# Patient Record
Sex: Female | Born: 1975 | Race: White | Hispanic: No | Smoking: Current some day smoker
Health system: Southern US, Community
[De-identification: ages and names within clinical notes are randomized; demographics above are authoritative.]

## PROBLEM LIST (undated history)

## (undated) DIAGNOSIS — M109 Gout, unspecified: Secondary | ICD-10-CM

## (undated) DIAGNOSIS — K859 Acute pancreatitis without necrosis or infection, unspecified: Secondary | ICD-10-CM

## (undated) DIAGNOSIS — F329 Major depressive disorder, single episode, unspecified: Secondary | ICD-10-CM

## (undated) DIAGNOSIS — J189 Pneumonia, unspecified organism: Secondary | ICD-10-CM

## (undated) DIAGNOSIS — G629 Polyneuropathy, unspecified: Secondary | ICD-10-CM

## (undated) DIAGNOSIS — F419 Anxiety disorder, unspecified: Secondary | ICD-10-CM

## (undated) DIAGNOSIS — J45909 Unspecified asthma, uncomplicated: Secondary | ICD-10-CM

## (undated) DIAGNOSIS — I8500 Esophageal varices without bleeding: Secondary | ICD-10-CM

## (undated) DIAGNOSIS — G7102 Facioscapulohumeral muscular dystrophy: Secondary | ICD-10-CM

## (undated) DIAGNOSIS — F603 Borderline personality disorder: Secondary | ICD-10-CM

## (undated) DIAGNOSIS — F1010 Alcohol abuse, uncomplicated: Secondary | ICD-10-CM

## (undated) DIAGNOSIS — E119 Type 2 diabetes mellitus without complications: Secondary | ICD-10-CM

## (undated) DIAGNOSIS — K219 Gastro-esophageal reflux disease without esophagitis: Secondary | ICD-10-CM

## (undated) DIAGNOSIS — M21379 Foot drop, unspecified foot: Secondary | ICD-10-CM

## (undated) DIAGNOSIS — F1021 Alcohol dependence, in remission: Secondary | ICD-10-CM

## (undated) DIAGNOSIS — F32A Depression, unspecified: Secondary | ICD-10-CM

## (undated) DIAGNOSIS — G7100 Muscular dystrophy, unspecified: Secondary | ICD-10-CM

## (undated) DIAGNOSIS — F319 Bipolar disorder, unspecified: Secondary | ICD-10-CM

## (undated) DIAGNOSIS — F1911 Other psychoactive substance abuse, in remission: Secondary | ICD-10-CM

## (undated) DIAGNOSIS — G4700 Insomnia, unspecified: Secondary | ICD-10-CM

## (undated) DIAGNOSIS — E785 Hyperlipidemia, unspecified: Secondary | ICD-10-CM

## (undated) HISTORY — DX: Muscular dystrophy, unspecified: G71.00

## (undated) HISTORY — DX: Insomnia, unspecified: G47.00

## (undated) HISTORY — DX: Hyperlipidemia, unspecified: E78.5

## (undated) HISTORY — DX: Other psychoactive substance abuse, in remission: F19.11

## (undated) HISTORY — DX: Gastro-esophageal reflux disease without esophagitis: K21.9

## (undated) HISTORY — DX: Polyneuropathy, unspecified: G62.9

## (undated) HISTORY — DX: Alcohol dependence, in remission: F10.21

## (undated) HISTORY — DX: Depression, unspecified: F32.A

## (undated) HISTORY — DX: Borderline personality disorder: F60.3

## (undated) HISTORY — DX: Foot drop, unspecified foot: M21.379

## (undated) HISTORY — PX: HERNIA REPAIR: SHX51

## (undated) HISTORY — DX: Type 2 diabetes mellitus without complications: E11.9

## (undated) HISTORY — DX: Major depressive disorder, single episode, unspecified: F32.9

---

## 2000-12-14 ENCOUNTER — Emergency Department (HOSPITAL_COMMUNITY): Admission: EM | Admit: 2000-12-14 | Discharge: 2000-12-14 | Payer: Self-pay | Admitting: Emergency Medicine

## 2001-01-13 ENCOUNTER — Emergency Department (HOSPITAL_COMMUNITY): Admission: EM | Admit: 2001-01-13 | Discharge: 2001-01-13 | Payer: Self-pay | Admitting: Emergency Medicine

## 2001-01-14 ENCOUNTER — Emergency Department (HOSPITAL_COMMUNITY): Admission: EM | Admit: 2001-01-14 | Discharge: 2001-01-14 | Payer: Self-pay | Admitting: *Deleted

## 2001-02-19 ENCOUNTER — Encounter: Payer: Self-pay | Admitting: Emergency Medicine

## 2001-02-19 ENCOUNTER — Emergency Department (HOSPITAL_COMMUNITY): Admission: EM | Admit: 2001-02-19 | Discharge: 2001-02-19 | Payer: Self-pay | Admitting: Emergency Medicine

## 2001-03-28 ENCOUNTER — Emergency Department (HOSPITAL_COMMUNITY): Admission: EM | Admit: 2001-03-28 | Discharge: 2001-03-28 | Payer: Self-pay | Admitting: Emergency Medicine

## 2001-04-29 ENCOUNTER — Emergency Department (HOSPITAL_COMMUNITY): Admission: EM | Admit: 2001-04-29 | Discharge: 2001-04-29 | Payer: Self-pay | Admitting: Emergency Medicine

## 2001-12-12 ENCOUNTER — Emergency Department (HOSPITAL_COMMUNITY): Admission: EM | Admit: 2001-12-12 | Discharge: 2001-12-12 | Payer: Self-pay | Admitting: Emergency Medicine

## 2001-12-12 ENCOUNTER — Encounter: Payer: Self-pay | Admitting: Emergency Medicine

## 2001-12-15 ENCOUNTER — Emergency Department (HOSPITAL_COMMUNITY): Admission: EM | Admit: 2001-12-15 | Discharge: 2001-12-15 | Payer: Self-pay | Admitting: Emergency Medicine

## 2001-12-25 ENCOUNTER — Emergency Department (HOSPITAL_COMMUNITY): Admission: EM | Admit: 2001-12-25 | Discharge: 2001-12-25 | Payer: Self-pay | Admitting: Emergency Medicine

## 2002-02-02 ENCOUNTER — Emergency Department (HOSPITAL_COMMUNITY): Admission: EM | Admit: 2002-02-02 | Discharge: 2002-02-02 | Payer: Self-pay | Admitting: Emergency Medicine

## 2002-02-02 ENCOUNTER — Encounter: Payer: Self-pay | Admitting: Emergency Medicine

## 2002-02-13 ENCOUNTER — Emergency Department (HOSPITAL_COMMUNITY): Admission: EM | Admit: 2002-02-13 | Discharge: 2002-02-13 | Payer: Self-pay | Admitting: Emergency Medicine

## 2002-03-18 ENCOUNTER — Encounter: Admission: RE | Admit: 2002-03-18 | Discharge: 2002-03-18 | Payer: Self-pay | Admitting: Internal Medicine

## 2002-03-18 ENCOUNTER — Encounter: Payer: Self-pay | Admitting: Internal Medicine

## 2002-05-21 ENCOUNTER — Emergency Department (HOSPITAL_COMMUNITY): Admission: EM | Admit: 2002-05-21 | Discharge: 2002-05-21 | Payer: Self-pay | Admitting: Emergency Medicine

## 2002-05-21 ENCOUNTER — Encounter: Payer: Self-pay | Admitting: Emergency Medicine

## 2002-05-31 ENCOUNTER — Emergency Department (HOSPITAL_COMMUNITY): Admission: EM | Admit: 2002-05-31 | Discharge: 2002-05-31 | Payer: Self-pay | Admitting: Emergency Medicine

## 2002-07-09 ENCOUNTER — Emergency Department (HOSPITAL_COMMUNITY): Admission: EM | Admit: 2002-07-09 | Discharge: 2002-07-09 | Payer: Self-pay | Admitting: Emergency Medicine

## 2003-02-09 ENCOUNTER — Emergency Department (HOSPITAL_COMMUNITY): Admission: EM | Admit: 2003-02-09 | Discharge: 2003-02-10 | Payer: Self-pay | Admitting: Emergency Medicine

## 2003-03-15 ENCOUNTER — Emergency Department (HOSPITAL_COMMUNITY): Admission: AD | Admit: 2003-03-15 | Discharge: 2003-03-15 | Payer: Self-pay | Admitting: Family Medicine

## 2003-04-12 ENCOUNTER — Emergency Department (HOSPITAL_COMMUNITY): Admission: AD | Admit: 2003-04-12 | Discharge: 2003-04-12 | Payer: Self-pay | Admitting: Family Medicine

## 2003-08-25 ENCOUNTER — Emergency Department (HOSPITAL_COMMUNITY): Admission: EM | Admit: 2003-08-25 | Discharge: 2003-08-25 | Payer: Self-pay | Admitting: Emergency Medicine

## 2003-08-30 ENCOUNTER — Emergency Department (HOSPITAL_COMMUNITY): Admission: EM | Admit: 2003-08-30 | Discharge: 2003-08-31 | Payer: Self-pay | Admitting: Emergency Medicine

## 2003-09-14 ENCOUNTER — Emergency Department (HOSPITAL_COMMUNITY): Admission: EM | Admit: 2003-09-14 | Discharge: 2003-09-15 | Payer: Self-pay | Admitting: *Deleted

## 2003-09-21 ENCOUNTER — Emergency Department (HOSPITAL_COMMUNITY): Admission: EM | Admit: 2003-09-21 | Discharge: 2003-09-21 | Payer: Self-pay | Admitting: Family Medicine

## 2003-10-01 ENCOUNTER — Emergency Department (HOSPITAL_COMMUNITY): Admission: EM | Admit: 2003-10-01 | Discharge: 2003-10-01 | Payer: Self-pay

## 2003-10-04 ENCOUNTER — Emergency Department (HOSPITAL_COMMUNITY): Admission: EM | Admit: 2003-10-04 | Discharge: 2003-10-04 | Payer: Self-pay | Admitting: Family Medicine

## 2003-10-06 ENCOUNTER — Inpatient Hospital Stay (HOSPITAL_COMMUNITY): Admission: RE | Admit: 2003-10-06 | Discharge: 2003-10-11 | Payer: Self-pay | Admitting: Psychiatry

## 2003-12-15 ENCOUNTER — Emergency Department (HOSPITAL_COMMUNITY): Admission: EM | Admit: 2003-12-15 | Discharge: 2003-12-15 | Payer: Self-pay | Admitting: Emergency Medicine

## 2004-01-05 ENCOUNTER — Emergency Department (HOSPITAL_COMMUNITY): Admission: EM | Admit: 2004-01-05 | Discharge: 2004-01-05 | Payer: Self-pay | Admitting: Family Medicine

## 2004-02-03 ENCOUNTER — Emergency Department (HOSPITAL_COMMUNITY): Admission: EM | Admit: 2004-02-03 | Discharge: 2004-02-03 | Payer: Self-pay | Admitting: Family Medicine

## 2004-02-26 ENCOUNTER — Emergency Department (HOSPITAL_COMMUNITY): Admission: EM | Admit: 2004-02-26 | Discharge: 2004-02-26 | Payer: Self-pay | Admitting: Family Medicine

## 2004-03-04 ENCOUNTER — Emergency Department (HOSPITAL_COMMUNITY): Admission: EM | Admit: 2004-03-04 | Discharge: 2004-03-04 | Payer: Self-pay | Admitting: Family Medicine

## 2004-03-22 ENCOUNTER — Emergency Department (HOSPITAL_COMMUNITY): Admission: EM | Admit: 2004-03-22 | Discharge: 2004-03-22 | Payer: Self-pay | Admitting: Emergency Medicine

## 2004-04-16 ENCOUNTER — Emergency Department (HOSPITAL_COMMUNITY): Admission: EM | Admit: 2004-04-16 | Discharge: 2004-04-16 | Payer: Self-pay | Admitting: Emergency Medicine

## 2004-05-15 ENCOUNTER — Emergency Department (HOSPITAL_COMMUNITY): Admission: EM | Admit: 2004-05-15 | Discharge: 2004-05-15 | Payer: Self-pay | Admitting: Emergency Medicine

## 2004-06-01 ENCOUNTER — Emergency Department (HOSPITAL_COMMUNITY): Admission: EM | Admit: 2004-06-01 | Discharge: 2004-06-01 | Payer: Self-pay | Admitting: Emergency Medicine

## 2004-06-18 ENCOUNTER — Inpatient Hospital Stay (HOSPITAL_COMMUNITY): Admission: AC | Admit: 2004-06-18 | Discharge: 2004-06-19 | Payer: Self-pay

## 2004-07-20 ENCOUNTER — Encounter: Payer: Self-pay | Admitting: Family Medicine

## 2004-07-20 ENCOUNTER — Ambulatory Visit: Payer: Self-pay | Admitting: Family Medicine

## 2004-07-23 ENCOUNTER — Emergency Department (HOSPITAL_COMMUNITY): Admission: EM | Admit: 2004-07-23 | Discharge: 2004-07-23 | Payer: Self-pay | Admitting: Emergency Medicine

## 2004-08-03 ENCOUNTER — Ambulatory Visit: Payer: Self-pay | Admitting: *Deleted

## 2004-08-29 ENCOUNTER — Emergency Department (HOSPITAL_COMMUNITY): Admission: EM | Admit: 2004-08-29 | Discharge: 2004-08-29 | Payer: Self-pay | Admitting: Emergency Medicine

## 2004-09-07 ENCOUNTER — Ambulatory Visit: Payer: Self-pay | Admitting: Obstetrics and Gynecology

## 2004-09-12 ENCOUNTER — Inpatient Hospital Stay (HOSPITAL_COMMUNITY): Admission: AD | Admit: 2004-09-12 | Discharge: 2004-09-12 | Payer: Self-pay | Admitting: *Deleted

## 2004-11-18 ENCOUNTER — Inpatient Hospital Stay (HOSPITAL_COMMUNITY): Admission: AD | Admit: 2004-11-18 | Discharge: 2004-11-24 | Payer: Self-pay | Admitting: Psychiatry

## 2004-11-18 ENCOUNTER — Ambulatory Visit: Payer: Self-pay | Admitting: Psychiatry

## 2004-11-30 ENCOUNTER — Emergency Department (HOSPITAL_COMMUNITY): Admission: EM | Admit: 2004-11-30 | Discharge: 2004-12-01 | Payer: Self-pay | Admitting: Emergency Medicine

## 2005-08-31 ENCOUNTER — Ambulatory Visit: Payer: Self-pay | Admitting: Family Medicine

## 2005-10-04 ENCOUNTER — Ambulatory Visit: Payer: Self-pay | Admitting: Obstetrics & Gynecology

## 2005-10-18 ENCOUNTER — Emergency Department (HOSPITAL_COMMUNITY): Admission: EM | Admit: 2005-10-18 | Discharge: 2005-10-19 | Payer: Self-pay | Admitting: Emergency Medicine

## 2006-01-09 ENCOUNTER — Ambulatory Visit: Payer: Self-pay | Admitting: Family Medicine

## 2006-12-02 ENCOUNTER — Encounter: Payer: Self-pay | Admitting: Internal Medicine

## 2006-12-18 ENCOUNTER — Emergency Department (HOSPITAL_COMMUNITY): Admission: EM | Admit: 2006-12-18 | Discharge: 2006-12-18 | Payer: Self-pay | Admitting: Family Medicine

## 2007-01-16 ENCOUNTER — Emergency Department (HOSPITAL_COMMUNITY): Admission: EM | Admit: 2007-01-16 | Discharge: 2007-01-16 | Payer: Self-pay | Admitting: Family Medicine

## 2007-02-19 ENCOUNTER — Emergency Department (HOSPITAL_COMMUNITY): Admission: EM | Admit: 2007-02-19 | Discharge: 2007-02-19 | Payer: Self-pay | Admitting: Family Medicine

## 2007-05-14 ENCOUNTER — Ambulatory Visit: Payer: Self-pay | Admitting: Obstetrics and Gynecology

## 2007-05-14 ENCOUNTER — Ambulatory Visit (HOSPITAL_COMMUNITY): Admission: AD | Admit: 2007-05-14 | Discharge: 2007-05-15 | Payer: Self-pay | Admitting: Obstetrics and Gynecology

## 2007-05-29 ENCOUNTER — Ambulatory Visit: Payer: Self-pay | Admitting: Obstetrics and Gynecology

## 2007-07-10 ENCOUNTER — Ambulatory Visit: Payer: Self-pay | Admitting: Family Medicine

## 2007-07-10 ENCOUNTER — Encounter: Payer: Self-pay | Admitting: Family Medicine

## 2007-12-29 ENCOUNTER — Emergency Department (HOSPITAL_COMMUNITY): Admission: EM | Admit: 2007-12-29 | Discharge: 2007-12-29 | Payer: Self-pay | Admitting: Emergency Medicine

## 2008-01-22 ENCOUNTER — Emergency Department (HOSPITAL_COMMUNITY): Admission: EM | Admit: 2008-01-22 | Discharge: 2008-01-22 | Payer: Self-pay | Admitting: Emergency Medicine

## 2008-04-26 ENCOUNTER — Emergency Department (HOSPITAL_COMMUNITY): Admission: EM | Admit: 2008-04-26 | Discharge: 2008-04-26 | Payer: Self-pay | Admitting: Emergency Medicine

## 2008-06-23 ENCOUNTER — Emergency Department (HOSPITAL_COMMUNITY): Admission: EM | Admit: 2008-06-23 | Discharge: 2008-06-23 | Payer: Self-pay | Admitting: Emergency Medicine

## 2008-06-23 ENCOUNTER — Inpatient Hospital Stay (HOSPITAL_COMMUNITY): Admission: AD | Admit: 2008-06-23 | Discharge: 2008-06-25 | Payer: Self-pay | Admitting: *Deleted

## 2008-06-23 ENCOUNTER — Ambulatory Visit: Payer: Self-pay | Admitting: *Deleted

## 2008-08-25 ENCOUNTER — Emergency Department (HOSPITAL_COMMUNITY): Admission: EM | Admit: 2008-08-25 | Discharge: 2008-08-25 | Payer: Self-pay | Admitting: Family Medicine

## 2008-09-02 ENCOUNTER — Emergency Department (HOSPITAL_COMMUNITY): Admission: EM | Admit: 2008-09-02 | Discharge: 2008-09-02 | Payer: Self-pay | Admitting: Emergency Medicine

## 2008-10-14 ENCOUNTER — Emergency Department (HOSPITAL_COMMUNITY): Admission: EM | Admit: 2008-10-14 | Discharge: 2008-10-14 | Payer: Self-pay | Admitting: Emergency Medicine

## 2008-11-21 ENCOUNTER — Emergency Department (HOSPITAL_COMMUNITY): Admission: EM | Admit: 2008-11-21 | Discharge: 2008-11-21 | Payer: Self-pay | Admitting: Emergency Medicine

## 2008-11-29 ENCOUNTER — Ambulatory Visit: Payer: Self-pay | Admitting: Psychiatry

## 2008-11-29 ENCOUNTER — Emergency Department (HOSPITAL_COMMUNITY): Admission: EM | Admit: 2008-11-29 | Discharge: 2008-11-29 | Payer: Self-pay | Admitting: Emergency Medicine

## 2008-11-29 ENCOUNTER — Inpatient Hospital Stay (HOSPITAL_COMMUNITY): Admission: AD | Admit: 2008-11-29 | Discharge: 2008-12-03 | Payer: Self-pay | Admitting: Psychiatry

## 2009-03-19 ENCOUNTER — Ambulatory Visit: Payer: Self-pay | Admitting: Internal Medicine

## 2009-05-07 ENCOUNTER — Emergency Department (HOSPITAL_COMMUNITY): Admission: EM | Admit: 2009-05-07 | Discharge: 2009-05-08 | Payer: Self-pay | Admitting: Emergency Medicine

## 2009-05-08 ENCOUNTER — Ambulatory Visit: Payer: Self-pay | Admitting: Psychiatry

## 2009-09-28 ENCOUNTER — Inpatient Hospital Stay (HOSPITAL_COMMUNITY): Admission: AD | Admit: 2009-09-28 | Discharge: 2009-10-02 | Payer: Self-pay | Admitting: Psychiatry

## 2009-09-28 ENCOUNTER — Ambulatory Visit: Payer: Self-pay | Admitting: Psychiatry

## 2009-09-28 ENCOUNTER — Emergency Department (HOSPITAL_COMMUNITY): Admission: EM | Admit: 2009-09-28 | Discharge: 2009-09-28 | Payer: Self-pay | Admitting: Emergency Medicine

## 2009-11-11 ENCOUNTER — Emergency Department (HOSPITAL_COMMUNITY): Admission: EM | Admit: 2009-11-11 | Discharge: 2009-11-11 | Payer: Self-pay | Admitting: Family Medicine

## 2010-03-06 ENCOUNTER — Emergency Department (HOSPITAL_COMMUNITY)
Admission: EM | Admit: 2010-03-06 | Discharge: 2010-03-06 | Payer: Self-pay | Source: Home / Self Care | Admitting: Emergency Medicine

## 2010-04-09 ENCOUNTER — Encounter: Payer: Self-pay | Admitting: *Deleted

## 2010-04-20 NOTE — Letter (Signed)
Summary: [facility].  [facility].   Imported By: Clyda Hurdle 12/06/2006 10:58:51  _____________________________________________________________________  External Attachment:    Type:   Image     Comment:   External Document

## 2010-05-23 ENCOUNTER — Emergency Department (HOSPITAL_COMMUNITY)
Admission: EM | Admit: 2010-05-23 | Discharge: 2010-05-23 | Discharge status: Against Medical Advice | Payer: Self-pay | Attending: Emergency Medicine | Admitting: Emergency Medicine

## 2010-05-23 DIAGNOSIS — Z046 Encounter for general psychiatric examination, requested by authority: Secondary | ICD-10-CM | POA: Insufficient documentation

## 2010-05-23 LAB — DIFFERENTIAL
Basophils Absolute: 0.1 10*3/uL (ref 0.0–0.1)
Basophils Relative: 1 % (ref 0–1)
Eosinophils Absolute: 0 10*3/uL (ref 0.0–0.7)
Eosinophils Relative: 1 % (ref 0–5)
Lymphocytes Relative: 30 % (ref 12–46)
Lymphs Abs: 2.2 10*3/uL (ref 0.7–4.0)
Monocytes Absolute: 0.5 10*3/uL (ref 0.1–1.0)
Monocytes Relative: 7 % (ref 3–12)
Neutro Abs: 4.3 10*3/uL (ref 1.7–7.7)
Neutrophils Relative %: 61 % (ref 43–77)

## 2010-05-23 LAB — URINALYSIS, ROUTINE W REFLEX MICROSCOPIC
Bilirubin Urine: NEGATIVE
Glucose, UA: NEGATIVE mg/dL
Hgb urine dipstick: NEGATIVE
Ketones, ur: NEGATIVE mg/dL
Nitrite: NEGATIVE
Protein, ur: NEGATIVE mg/dL
Specific Gravity, Urine: 1.008 (ref 1.005–1.030)
Urobilinogen, UA: 1 mg/dL (ref 0.0–1.0)
pH: 6.5 (ref 5.0–8.0)

## 2010-05-23 LAB — CBC
HCT: 43.4 % (ref 36.0–46.0)
Hemoglobin: 14.4 g/dL (ref 12.0–15.0)
MCH: 33.5 pg (ref 26.0–34.0)
MCHC: 33.2 g/dL (ref 30.0–36.0)
MCV: 100.9 fL — ABNORMAL HIGH (ref 78.0–100.0)
Platelets: 168 10*3/uL (ref 150–400)
RBC: 4.3 MIL/uL (ref 3.87–5.11)
RDW: 14.9 % (ref 11.5–15.5)
WBC: 7.1 10*3/uL (ref 4.0–10.5)

## 2010-05-23 LAB — COMPREHENSIVE METABOLIC PANEL
ALT: 39 U/L — ABNORMAL HIGH (ref 0–35)
AST: 268 U/L — ABNORMAL HIGH (ref 0–37)
Albumin: 3.7 g/dL (ref 3.5–5.2)
Alkaline Phosphatase: 77 U/L (ref 39–117)
BUN: 2 mg/dL — ABNORMAL LOW (ref 6–23)
CO2: 23 mEq/L (ref 19–32)
Calcium: 8.8 mg/dL (ref 8.4–10.5)
Chloride: 99 mEq/L (ref 96–112)
Creatinine, Ser: 0.77 mg/dL (ref 0.4–1.2)
GFR calc Af Amer: >60 mL/min (ref >60)
GFR calc non Af Amer: >60 mL/min (ref >60)
Glucose, Bld: 196 mg/dL — ABNORMAL HIGH (ref 70–99)
Potassium: 3.4 mEq/L — ABNORMAL LOW (ref 3.5–5.1)
Sodium: 136 mEq/L (ref 135–145)
Total Bilirubin: 0.9 mg/dL (ref 0.3–1.2)
Total Protein: 7.2 g/dL (ref 6.0–8.3)

## 2010-05-23 LAB — URINE MICROSCOPIC-ADD ON

## 2010-05-23 LAB — RAPID URINE DRUG SCREEN, HOSP PERFORMED
Amphetamines: NOT DETECTED
Barbiturates: POSITIVE — AB
Benzodiazepines: NOT DETECTED
Cocaine: POSITIVE — AB
Opiates: NOT DETECTED
Tetrahydrocannabinol: NOT DETECTED

## 2010-05-23 LAB — POCT PREGNANCY, URINE: Preg Test, Ur: NEGATIVE

## 2010-05-23 LAB — ETHANOL: Alcohol, Ethyl (B): 367 mg/dL — ABNORMAL HIGH (ref 0–10)

## 2010-05-23 LAB — LIPASE, BLOOD: Lipase: 31 U/L (ref 11–59)

## 2010-05-30 ENCOUNTER — Inpatient Hospital Stay (HOSPITAL_COMMUNITY)
Admission: AD | Admit: 2010-05-30 | Discharge: 2010-06-03 | DRG: 896 | Disposition: A | Discharge status: Home / Self Care | Payer: Self-pay | Source: Ambulatory Visit | Attending: Internal Medicine | Admitting: Internal Medicine

## 2010-05-30 DIAGNOSIS — E876 Hypokalemia: Secondary | ICD-10-CM | POA: Diagnosis not present

## 2010-05-30 DIAGNOSIS — F1020 Alcohol dependence, uncomplicated: Secondary | ICD-10-CM | POA: Diagnosis present

## 2010-05-30 DIAGNOSIS — F10239 Alcohol dependence with withdrawal, unspecified: Principal | ICD-10-CM | POA: Diagnosis present

## 2010-05-30 DIAGNOSIS — J45909 Unspecified asthma, uncomplicated: Secondary | ICD-10-CM | POA: Diagnosis present

## 2010-05-30 DIAGNOSIS — F10939 Alcohol use, unspecified with withdrawal, unspecified: Principal | ICD-10-CM | POA: Diagnosis present

## 2010-05-30 DIAGNOSIS — Z882 Allergy status to sulfonamides status: Secondary | ICD-10-CM

## 2010-05-30 DIAGNOSIS — K219 Gastro-esophageal reflux disease without esophagitis: Secondary | ICD-10-CM | POA: Diagnosis present

## 2010-05-30 DIAGNOSIS — K859 Acute pancreatitis without necrosis or infection, unspecified: Secondary | ICD-10-CM | POA: Diagnosis present

## 2010-05-30 DIAGNOSIS — N390 Urinary tract infection, site not specified: Secondary | ICD-10-CM | POA: Diagnosis present

## 2010-05-30 DIAGNOSIS — R030 Elevated blood-pressure reading, without diagnosis of hypertension: Secondary | ICD-10-CM | POA: Diagnosis present

## 2010-05-30 DIAGNOSIS — Z881 Allergy status to other antibiotic agents status: Secondary | ICD-10-CM

## 2010-05-30 DIAGNOSIS — F319 Bipolar disorder, unspecified: Secondary | ICD-10-CM | POA: Diagnosis present

## 2010-05-30 DIAGNOSIS — Z888 Allergy status to other drugs, medicaments and biological substances status: Secondary | ICD-10-CM

## 2010-05-30 DIAGNOSIS — E872 Acidosis, unspecified: Secondary | ICD-10-CM | POA: Diagnosis present

## 2010-05-30 DIAGNOSIS — F603 Borderline personality disorder: Secondary | ICD-10-CM | POA: Diagnosis present

## 2010-05-30 DIAGNOSIS — F17200 Nicotine dependence, unspecified, uncomplicated: Secondary | ICD-10-CM | POA: Diagnosis present

## 2010-05-30 DIAGNOSIS — E669 Obesity, unspecified: Secondary | ICD-10-CM | POA: Diagnosis present

## 2010-05-30 DIAGNOSIS — E119 Type 2 diabetes mellitus without complications: Secondary | ICD-10-CM | POA: Diagnosis present

## 2010-05-30 LAB — GLUCOSE, CAPILLARY: Glucose-Capillary: 172 mg/dL — ABNORMAL HIGH (ref 70–99)

## 2010-05-31 LAB — CBC
HCT: 37.6 % (ref 36.0–46.0)
Hemoglobin: 12.9 g/dL (ref 12.0–15.0)
MCH: 34 pg (ref 26.0–34.0)
MCHC: 34.3 g/dL (ref 30.0–36.0)
MCV: 99.2 fL (ref 78.0–100.0)
Platelets: 140 10*3/uL — ABNORMAL LOW (ref 150–400)
RBC: 3.79 MIL/uL — ABNORMAL LOW (ref 3.87–5.11)
RDW: 14.5 % (ref 11.5–15.5)
WBC: 5.5 10*3/uL (ref 4.0–10.5)

## 2010-05-31 LAB — GLUCOSE, CAPILLARY
Glucose-Capillary: 196 mg/dL — ABNORMAL HIGH (ref 70–99)
Glucose-Capillary: 154 mg/dL — ABNORMAL HIGH (ref 70–99)
Glucose-Capillary: 130 mg/dL — ABNORMAL HIGH (ref 70–99)
Glucose-Capillary: 137 mg/dL — ABNORMAL HIGH (ref 70–99)
Glucose-Capillary: 152 mg/dL — ABNORMAL HIGH (ref 70–99)

## 2010-05-31 LAB — COMPREHENSIVE METABOLIC PANEL
ALT: 32 U/L (ref 0–35)
AST: 251 U/L — ABNORMAL HIGH (ref 0–37)
Albumin: 3.4 g/dL — ABNORMAL LOW (ref 3.5–5.2)
Alkaline Phosphatase: 69 U/L (ref 39–117)
BUN: 7 mg/dL (ref 6–23)
CO2: 24 mEq/L (ref 19–32)
Calcium: 8.4 mg/dL (ref 8.4–10.5)
Chloride: 98 mEq/L (ref 96–112)
Creatinine, Ser: 0.87 mg/dL (ref 0.4–1.2)
GFR calc Af Amer: >60 mL/min (ref >60)
GFR calc non Af Amer: >60 mL/min (ref >60)
Glucose, Bld: 146 mg/dL — ABNORMAL HIGH (ref 70–99)
Potassium: 3.7 mEq/L (ref 3.5–5.1)
Sodium: 134 mEq/L — ABNORMAL LOW (ref 135–145)
Total Bilirubin: 4 mg/dL — ABNORMAL HIGH (ref 0.3–1.2)
Total Protein: 6.9 g/dL (ref 6.0–8.3)

## 2010-05-31 LAB — PROTIME-INR
INR: 1 (ref 0.00–1.49)
Prothrombin Time: 13.4 seconds (ref 11.6–15.2)

## 2010-05-31 LAB — APTT: aPTT: 26 seconds (ref 24–37)

## 2010-05-31 LAB — LIPASE, BLOOD: Lipase: 102 U/L — ABNORMAL HIGH (ref 11–59)

## 2010-06-01 DIAGNOSIS — F319 Bipolar disorder, unspecified: Secondary | ICD-10-CM

## 2010-06-01 LAB — COMPREHENSIVE METABOLIC PANEL
ALT: 26 U/L (ref 0–35)
AST: 206 U/L — ABNORMAL HIGH (ref 0–37)
Albumin: 3.1 g/dL — ABNORMAL LOW (ref 3.5–5.2)
Alkaline Phosphatase: 58 U/L (ref 39–117)
BUN: 2 mg/dL — ABNORMAL LOW (ref 6–23)
CO2: 23 mEq/L (ref 19–32)
Calcium: 8.3 mg/dL — ABNORMAL LOW (ref 8.4–10.5)
Chloride: 106 mEq/L (ref 96–112)
Creatinine, Ser: 0.73 mg/dL (ref 0.4–1.2)
GFR calc Af Amer: >60 mL/min (ref >60)
GFR calc non Af Amer: >60 mL/min (ref >60)
Glucose, Bld: 101 mg/dL — ABNORMAL HIGH (ref 70–99)
Potassium: 3.6 mEq/L (ref 3.5–5.1)
Sodium: 138 mEq/L (ref 135–145)
Total Bilirubin: 3.5 mg/dL — ABNORMAL HIGH (ref 0.3–1.2)
Total Protein: 6.1 g/dL (ref 6.0–8.3)

## 2010-06-01 LAB — URINE CULTURE
Colony Count: NO GROWTH
Culture  Setup Time: 201203141205
Culture: NO GROWTH
Special Requests: NEGATIVE

## 2010-06-01 LAB — GLUCOSE, CAPILLARY
Glucose-Capillary: 115 mg/dL — ABNORMAL HIGH (ref 70–99)
Glucose-Capillary: 140 mg/dL — ABNORMAL HIGH (ref 70–99)
Glucose-Capillary: 128 mg/dL — ABNORMAL HIGH (ref 70–99)

## 2010-06-01 LAB — URINALYSIS, ROUTINE W REFLEX MICROSCOPIC
Glucose, UA: NEGATIVE mg/dL
Hgb urine dipstick: NEGATIVE
Ketones, ur: NEGATIVE mg/dL
Nitrite: NEGATIVE
Protein, ur: NEGATIVE mg/dL
Specific Gravity, Urine: 1.011 (ref 1.005–1.030)
Urobilinogen, UA: 1 mg/dL (ref 0.0–1.0)
pH: 7 (ref 5.0–8.0)

## 2010-06-02 LAB — GLUCOSE, CAPILLARY
Glucose-Capillary: 156 mg/dL — ABNORMAL HIGH (ref 70–99)
Glucose-Capillary: 131 mg/dL — ABNORMAL HIGH (ref 70–99)
Glucose-Capillary: 174 mg/dL — ABNORMAL HIGH (ref 70–99)
Glucose-Capillary: 136 mg/dL — ABNORMAL HIGH (ref 70–99)
Glucose-Capillary: 189 mg/dL — ABNORMAL HIGH (ref 70–99)

## 2010-06-02 LAB — CBC
HCT: 34.2 % — ABNORMAL LOW (ref 36.0–46.0)
Hemoglobin: 11.3 g/dL — ABNORMAL LOW (ref 12.0–15.0)
MCH: 33 pg (ref 26.0–34.0)
MCHC: 33 g/dL (ref 30.0–36.0)
MCV: 100 fL (ref 78.0–100.0)
Platelets: 115 10*3/uL — ABNORMAL LOW (ref 150–400)
RBC: 3.42 MIL/uL — ABNORMAL LOW (ref 3.87–5.11)
RDW: 14.8 % (ref 11.5–15.5)
WBC: 5.4 10*3/uL (ref 4.0–10.5)

## 2010-06-02 LAB — COMPREHENSIVE METABOLIC PANEL
ALT: 21 U/L (ref 0–35)
AST: 138 U/L — ABNORMAL HIGH (ref 0–37)
Albumin: 3.1 g/dL — ABNORMAL LOW (ref 3.5–5.2)
Alkaline Phosphatase: 68 U/L (ref 39–117)
BUN: <1 mg/dL — ABNORMAL LOW (ref 6–23)
CO2: 24 mEq/L (ref 19–32)
Calcium: 8.7 mg/dL (ref 8.4–10.5)
Chloride: 104 mEq/L (ref 96–112)
Creatinine, Ser: 0.82 mg/dL (ref 0.4–1.2)
GFR calc Af Amer: >60 mL/min (ref >60)
GFR calc non Af Amer: >60 mL/min (ref >60)
Glucose, Bld: 106 mg/dL — ABNORMAL HIGH (ref 70–99)
Potassium: 3.2 mEq/L — ABNORMAL LOW (ref 3.5–5.1)
Sodium: 137 mEq/L (ref 135–145)
Total Bilirubin: 3.5 mg/dL — ABNORMAL HIGH (ref 0.3–1.2)
Total Protein: 6.2 g/dL (ref 6.0–8.3)

## 2010-06-02 LAB — MAGNESIUM: Magnesium: 1.2 mg/dL — ABNORMAL LOW (ref 1.5–2.5)

## 2010-06-02 LAB — LIPASE, BLOOD: Lipase: 43 U/L (ref 11–59)

## 2010-06-03 LAB — DRUGS OF ABUSE SCREEN W/O ALC, ROUTINE URINE
Amphetamine Screen, Ur: NEGATIVE
Barbiturate Quant, Ur: POSITIVE — AB
Benzodiazepines.: NEGATIVE
Cocaine Metabolites: NEGATIVE
Creatinine,U: 64.7 mg/dL
Marijuana Metabolite: NEGATIVE
Methadone: NEGATIVE
Opiate Screen, Urine: NEGATIVE
Phencyclidine (PCP): NEGATIVE
Propoxyphene: NEGATIVE

## 2010-06-03 LAB — BASIC METABOLIC PANEL
BUN: 1 mg/dL — ABNORMAL LOW (ref 6–23)
CO2: 24 mEq/L (ref 19–32)
Calcium: 8.9 mg/dL (ref 8.4–10.5)
Chloride: 103 mEq/L (ref 96–112)
Creatinine, Ser: 0.8 mg/dL (ref 0.4–1.2)
GFR calc Af Amer: >60 mL/min (ref >60)
GFR calc non Af Amer: >60 mL/min (ref >60)
Glucose, Bld: 144 mg/dL — ABNORMAL HIGH (ref 70–99)
Potassium: 3.8 mEq/L (ref 3.5–5.1)
Sodium: 135 mEq/L (ref 135–145)

## 2010-06-03 LAB — GLUCOSE, CAPILLARY: Glucose-Capillary: 164 mg/dL — ABNORMAL HIGH (ref 70–99)

## 2010-06-04 LAB — COMPREHENSIVE METABOLIC PANEL
ALT: 41 U/L — ABNORMAL HIGH (ref 0–35)
AST: 107 U/L — ABNORMAL HIGH (ref 0–37)
Albumin: 4.5 g/dL (ref 3.5–5.2)
Alkaline Phosphatase: 58 U/L (ref 39–117)
BUN: 6 mg/dL (ref 6–23)
CO2: 15 mEq/L — ABNORMAL LOW (ref 19–32)
Calcium: 9.1 mg/dL (ref 8.4–10.5)
Chloride: 101 mEq/L (ref 96–112)
Creatinine, Ser: 0.73 mg/dL (ref 0.4–1.2)
GFR calc Af Amer: >60 mL/min (ref >60)
GFR calc non Af Amer: >60 mL/min (ref >60)
Glucose, Bld: 160 mg/dL — ABNORMAL HIGH (ref 70–99)
Potassium: 3.5 mEq/L (ref 3.5–5.1)
Sodium: 132 mEq/L — ABNORMAL LOW (ref 135–145)
Total Bilirubin: 0.8 mg/dL (ref 0.3–1.2)
Total Protein: 7.8 g/dL (ref 6.0–8.3)

## 2010-06-04 LAB — DIFFERENTIAL
Basophils Absolute: 0.1 10*3/uL (ref 0.0–0.1)
Basophils Relative: 2 % — ABNORMAL HIGH (ref 0–1)
Eosinophils Absolute: 0.1 10*3/uL (ref 0.0–0.7)
Eosinophils Relative: 1 % (ref 0–5)
Lymphocytes Relative: 37 % (ref 12–46)
Lymphs Abs: 3 10*3/uL (ref 0.7–4.0)
Monocytes Absolute: 0.5 10*3/uL (ref 0.1–1.0)
Monocytes Relative: 7 % (ref 3–12)
Neutro Abs: 4.4 10*3/uL (ref 1.7–7.7)
Neutrophils Relative %: 54 % (ref 43–77)

## 2010-06-04 LAB — ETHANOL
Alcohol, Ethyl (B): 173 mg/dL — ABNORMAL HIGH (ref 0–10)
Alcohol, Ethyl (B): 271 mg/dL — ABNORMAL HIGH (ref 0–10)
Alcohol, Ethyl (B): 348 mg/dL — ABNORMAL HIGH (ref 0–10)

## 2010-06-04 LAB — URINALYSIS, ROUTINE W REFLEX MICROSCOPIC
Bilirubin Urine: NEGATIVE
Glucose, UA: NEGATIVE mg/dL
Ketones, ur: NEGATIVE mg/dL
Leukocytes, UA: NEGATIVE
Nitrite: NEGATIVE
Protein, ur: NEGATIVE mg/dL
Specific Gravity, Urine: 1.006 (ref 1.005–1.030)
Urobilinogen, UA: 0.2 mg/dL (ref 0.0–1.0)
pH: 6 (ref 5.0–8.0)

## 2010-06-04 LAB — URINE MICROSCOPIC-ADD ON

## 2010-06-04 LAB — CBC
HCT: 44.3 % (ref 36.0–46.0)
Hemoglobin: 15.4 g/dL — ABNORMAL HIGH (ref 12.0–15.0)
MCH: 35.4 pg — ABNORMAL HIGH (ref 26.0–34.0)
MCHC: 34.7 g/dL (ref 30.0–36.0)
MCV: 102 fL — ABNORMAL HIGH (ref 78.0–100.0)
Platelets: 187 10*3/uL (ref 150–400)
RBC: 4.34 MIL/uL (ref 3.87–5.11)
RDW: 14.9 % (ref 11.5–15.5)
WBC: 8.1 10*3/uL (ref 4.0–10.5)

## 2010-06-04 LAB — RAPID URINE DRUG SCREEN, HOSP PERFORMED
Amphetamines: NOT DETECTED
Barbiturates: NOT DETECTED
Benzodiazepines: NOT DETECTED
Cocaine: NOT DETECTED
Opiates: NOT DETECTED
Tetrahydrocannabinol: NOT DETECTED

## 2010-06-04 LAB — SALICYLATE LEVEL: Salicylate Lvl: <4 mg/dL (ref 2.8–20.0)

## 2010-06-04 LAB — POCT PREGNANCY, URINE: Preg Test, Ur: NEGATIVE

## 2010-06-04 LAB — ACETAMINOPHEN LEVEL: Acetaminophen (Tylenol), Serum: <10 ug/mL — ABNORMAL LOW (ref 10–30)

## 2010-06-07 LAB — CBC
HCT: 42.3 % (ref 36.0–46.0)
Hemoglobin: 14.3 g/dL (ref 12.0–15.0)
MCHC: 33.8 g/dL (ref 30.0–36.0)
MCV: 99.5 fL (ref 78.0–100.0)
Platelets: 250 10*3/uL (ref 150–400)
RBC: 4.26 MIL/uL (ref 3.87–5.11)
RDW: 16 % — ABNORMAL HIGH (ref 11.5–15.5)
WBC: 5.5 10*3/uL (ref 4.0–10.5)

## 2010-06-07 LAB — URINALYSIS, ROUTINE W REFLEX MICROSCOPIC
Bilirubin Urine: NEGATIVE
Glucose, UA: NEGATIVE mg/dL
Hgb urine dipstick: NEGATIVE
Ketones, ur: NEGATIVE mg/dL
Nitrite: NEGATIVE
Protein, ur: NEGATIVE mg/dL
Specific Gravity, Urine: 1.01 (ref 1.005–1.030)
Urobilinogen, UA: 0.2 mg/dL (ref 0.0–1.0)
pH: 6 (ref 5.0–8.0)

## 2010-06-07 LAB — DIFFERENTIAL
Basophils Absolute: 0.1 10*3/uL (ref 0.0–0.1)
Basophils Relative: 2 % — ABNORMAL HIGH (ref 0–1)
Eosinophils Absolute: 0 10*3/uL (ref 0.0–0.7)
Eosinophils Relative: 0 % (ref 0–5)
Lymphocytes Relative: 41 % (ref 12–46)
Lymphs Abs: 2.2 10*3/uL (ref 0.7–4.0)
Monocytes Absolute: 0.2 10*3/uL (ref 0.1–1.0)
Monocytes Relative: 5 % (ref 3–12)
Neutro Abs: 2.9 10*3/uL (ref 1.7–7.7)
Neutrophils Relative %: 53 % (ref 43–77)

## 2010-06-07 LAB — RAPID URINE DRUG SCREEN, HOSP PERFORMED
Amphetamines: NOT DETECTED
Barbiturates: NOT DETECTED
Benzodiazepines: NOT DETECTED
Cocaine: NOT DETECTED
Opiates: NOT DETECTED
Tetrahydrocannabinol: NOT DETECTED

## 2010-06-07 LAB — POCT I-STAT, CHEM 8
BUN: 6 mg/dL (ref 6–23)
Calcium, Ion: 1.01 mmol/L — ABNORMAL LOW (ref 1.12–1.32)
Chloride: 113 mEq/L — ABNORMAL HIGH (ref 96–112)
Creatinine, Ser: 0.6 mg/dL (ref 0.4–1.2)
Glucose, Bld: 85 mg/dL (ref 70–99)
HCT: 46 % (ref 36.0–46.0)
Hemoglobin: 15.6 g/dL — ABNORMAL HIGH (ref 12.0–15.0)
Potassium: 3.3 mEq/L — ABNORMAL LOW (ref 3.5–5.1)
Sodium: 144 mEq/L (ref 135–145)
TCO2: 18 mmol/L (ref 0–100)

## 2010-06-07 LAB — ETHANOL: Alcohol, Ethyl (B): 370 mg/dL — ABNORMAL HIGH (ref 0–10)

## 2010-06-07 LAB — POCT PREGNANCY, URINE: Preg Test, Ur: NEGATIVE

## 2010-06-08 LAB — BARBITURATE, URINE, CONFIRMATION
Amobarbital UR Quant: NEGATIVE
Butabarbital UR Quant: 340 ng/mL
Butalbital UR Quant: NEGATIVE
Pentobarbital GC/MS Conf: NEGATIVE
Phenobarbital GC/MS Conf: NEGATIVE
Secobarbital GC/MS Conf: NEGATIVE

## 2010-06-08 NOTE — Discharge Summary (Signed)
[name], WOOL NO.:  [id number]  MEDICAL RECORD NO.:  [id number]           PATIENT TYPE:  I  LOCATION:  5159                         FACILITY:  [facility]  PHYSICIAN:  Thora Lance, M.D.  DATE OF BIRTH:  [birth year]  DATE OF ADMISSION:  05/30/2010 DATE OF DISCHARGE:                              DISCHARGE SUMMARY   REASON FOR ADMISSION:  The patient is a 35 year old white female with a history of bipolar disorder and polysubstance abuse who presented with nausea and vomiting.  She admitted that she had been drinking heavily in the weeks prior to extending to our office.  She requested admission for detox.  SIGNIFICANT FINDINGS:  VITAL SIGNS:  Temperature 98.9, blood pressure 120/80, heart rate 68. LUNGS:  Some wheezing bilaterally. HEART:  Regular rate and rhythm. ABDOMEN:  Diffuse mild tenderness without rebound or guarding.  No masses.  No rebound. EXTREMITIES:  No edema. NEUROLOGIC:  Nonfocal. LABORATORY FINDINGS:  Glucose 152, BUN 6, creatinine 0.76, sodium 132, potassium 3.4, chloride 93, bicarbonate 20, calcium 9.7, total protein 7.7, albumin 4.3, total bilirubin 2.1, alk phos 78, AST 220, ALT 29. Urinalysis showed a trace leukocyte esterase, positive nitrite, 1+ protein, 2+ bilirubin.  Hemoglobin A1c was 6.9, amylase 50, lipase 112, micro urinalysis tended to be 20 wbcs.  HOSPITAL COURSE: 1. Alcohol withdrawal.  The patient was admitted for alcohol     withdrawal, placed in Alcohol withdrawal protocol.  Her nausea and     vomiting quickly improved with IV fluids and supportive care.  She     was placed on a clear liquid diet and her diet was advanced to a     regular diet by the second day which she tolerated well.  She had     mild elevation of lipase but no significant pain during the     hospitalization.  She likely had a very mild case of pancreatitis.     She was seen by Child psychotherapist and given full information on alcohol     treatment  options.  One day prior to discharge, her psychiatrist     placed her on clonazepam and her lorazepam was discontinued.  She     was given multivitamins, folic acid and thiamine. 2. Tobacco withdrawal.  The patient is a long-term smoker and was     placed on nicotine patch during the hospitalization to ease tobacco     withdrawal symptoms. 3. Bipolar disorder.  The patient had not been taking her psychiatric     medicines for at least several weeks prior to admission as she was     using alcohol heavily.  She was seen by the psychiatric service and     restarted on fluoxetine, clonazepam, Haldol and Tegretol.  She was     set up with an appointment with [facility] on March 19 at 3:00     p.m. for further psychiatric treatment. 4. Asthma.  The patient was treated with Symbicort throughout the     hospitalization.  She had no trouble with her asthma.  Her lungs  were clear at discharge. 5. Metabolic acidosis.  The patient did have a mild metabolic acidosis     at admission which was likely related to alcoholic ketoacidosis.    Her metabolic parameters normalized within 24 hours. 6. UTI.  The patient did have some pyuria at admission.  Urine culture     was obtained but was compromised, but being obtained after     antibiotics were administered.  Her urine was clear by discharge     per urinalysis and Cipro was stopped at discharge. 7. Diabetes mellitus, type 2, new diagnosis.  Hemoglobin A1c 6.9.     CBGs were in the low to mid 100s.  She was seen by a diabetic     teaching and given information of diabetic diet and basic     education. 8. Elevated blood pressure.  The patient's blood pressure was     initially elevated with the diastolic blood pressures in the 90s     and sometimes over 100.  By discharge her blood pressure was     dropping into the 120s/80s range.  This will be followed up as an     outpatient.  DISCHARGE DIAGNOSES: 1. Alcohol withdrawal. 2. Chronic  alcoholism. 3. Polysubstance abuse. 4. Tobacco use. 5. Bipolar disorder. 6. Asthma. 7. Mild pancreatitis. 8. Metabolic acidosis. 9. Urinary tract infection. 10.Diabetes mellitus. 11.Elevated blood pressure.  PROCEDURES:  None.  DISCHARGE MEDICATIONS: 1. Prilosec 20 mg 1 p.o. daily. 2. Symbicort 160/4.5 two puffs inhaled twice a day. 3. Ibuprofen 200 mg 2-4 tablets q.8 p.r.n. 4. Tegretol 200 mg 1 p.o. b.i.d. 5. Fluoxetine 40 mg 2 p.o. daily. 6. Haldol 5 mg two p.o. nightly. 7. Clonazepam 1 mg 1 p.o. b.i.d. 8. Multivitamin 1 p.o. daily. 9. Albuterol 90 mcg inhaler 2 puffs q.4 p.r.n. shortness of breath. 10.Thiamine 100 mg 1 p.o. daily for 1 month. 11.Folic acid 1 mg p.o. q.a.m. for 1 month. 12.Multivitamin 1 tablet p.o. q.a.m.  DISPOSITION:  Discharged to home.  FOLLOWUP:  March 19 with [facility] at 3:00 p.m. Two weeks Dr. Valentina Lucks.  CODE STATUS:  Full code.  DIET:  Regular diet.  ACTIVITY:  As tolerated.          ______________________________ Thora Lance, M.D.     JJG/MEDQ  D:  06/02/2010  T:  06/03/2010  Job:  [id number]  cc:   [facility]  Electronically Signed by Kirby Funk M.D. on 06/08/2010 06:26:10 PM

## 2010-06-10 NOTE — Consult Note (Signed)
[name], [name]                 ACCOUNT NO.:  [id number]  MEDICAL RECORD NO.:  [id number]           PATIENT TYPE:  I  LOCATION:  5114                         FACILITY:  [facility]  PHYSICIAN:  Eulogio Ditch, MD DATE OF BIRTH:  [birth year]  DATE OF CONSULTATION:  06/01/2010 DATE OF DISCHARGE:                                CONSULTATION   REASON FOR CONSULTATION:  Long history of mental issues and alcohol abuse.  HISTORY OF PRESENT ILLNESS:  A 35 year old white female who was seen with case manager, [place].  The patient has a long history of mental issues since the age of 65.  She has a history of bipolar disorder, borderline personality disorder and chronic alcohol abuse.  The patient is admitted on the medical floor, and he is getting alcohol detox and Ativan detox protocol.  The patient at this time has no withdrawal symptoms.  The patient is very logical and goal directed during the interview, was pleasant on approach.  Denies hearing any voices.  Denies any suicidal or homicidal ideations.  The patient is not internally preoccupied during the interview.  The patient told me that she is on Prozac 80 mg p.o. daily, Tegretol 4 mg twice a day, Haldol 10 mg at bedtime, trazodone 100 mg at bedtime but the patient is noncompliant with his medications as she could not afford to follow up with Dr. Betti Cruz in the outpatient practice as she cannot afford to pay 95 dollar per visit but she got an appointment now at [facility] to follow up in the outpatient setting, phone number [phone], extension [phone], person name is Crystal.  The patient told me that in the past she has got improved by getting DVD for borderline personality.  The patient was also admitted to [facility] in the past for alcohol abuse and self- inflicting  injuries for cutting herself on her forearm.  At this time, the patient has no new injuries on the forearm and she is not suicidal.  MEDICAL ISSUES:  She is  obese, history of alcohol abuse.  History of MRSA.  ALLERGIES:  ERYTHROMYCIN, SULFA, NAPROSYN.  MENTAL STATUS EXAMINATION:  The patient is calm, cooperative during interview, pleasant on approach.  Hygiene, grooming, fair.  No psychomotor agitation or retardation noted during the interview.  Speech normal in rate, rhythm and volume.  Mood, anxious affect, mood congruent.  Thought process logical and goal directed.  Thought content not suicidal or homicidal.  No delusional thought perception.  No audiovisual hallucination reported.  Not internally preoccupied. Cognition alert, awake, oriented x3.  Memory immediate, recent remote. Fund of knowledge fair.  Attention concentration good.  Insight and judgment intact.  DIAGNOSIS:  AXIS I:  As per history bipolar disorder, chronic alcohol abuse, cocaine abuse. AXIS II:  Deferred. AXIS III:  See medical notes. AXIS IV:  Chronic mental health issues. AXIS V:  50.  RECOMMENDATIONS: 1. The patient can be continued on Ativan detox protocol, thiamine,     folic acid. 2. I started the patient on Prozac 50 mg p.o. daily, Tegretol 200 mg     twice a day.  I started with 200 mg as the patient was noncompliant     with this medication.  Haldol 10 mg at bedtime.  The patient was     not getting any benefit from trazodone, so I did not start     trazodone at this time. 3. The patient can follow up in the outpatient setting at [facility]. 4. The patient was given option for behavioral health, but the patient     do not want to be admitted to Behavior Health.  The patient does     not meet criteria to be admitted to [facility] on IVC.     Eulogio Ditch, MD     SA/MEDQ  D:  06/02/2010  T:  06/02/2010  Job:  [id number]  Electronically Signed by Eulogio Ditch  on 06/10/2010 07:17:15 AM

## 2010-06-15 NOTE — H&P (Signed)
[name], [name]                 ACCOUNT NO.:  [id number]  MEDICAL RECORD NO.:  [id number]           PATIENT TYPE:  I  LOCATION:  5114                         FACILITY:  [facility]  PHYSICIAN:  Thora Lance, M.D.  DATE OF BIRTH:  06-17-1975  DATE OF ADMISSION:  05/30/2010 DATE OF DISCHARGE:                             HISTORY & PHYSICAL   HISTORY OF PRESENT ILLNESS: 1. Acidosis, probably alcoholic.  The patient was found to be in a     state of acidosis yesterday.  Brought back in today for further     evaluation.  Glucose yesterday of 200.  Potassium of 2.9.  Was     asked to replace potassium with supplement.  Unable to tolerate due     to nausea and vomiting.  Continues to drink alcohol.  Complains of     nausea, vomiting with no diarrhea.  Generalized abdominal pain 2. Abdominal pain generalized.  The patient complains of generalized     abdominal pain.  Drinks "a lot" of alcohol daily.  Lab work reveals     a lipase of 112 suggesting pancreatitis.  AST is 220.  Total     bilirubin 2.1.  Urine shows 10-20 white cells per high power field     suggesting a UTI.  Glucose today 153.  Potassium 3.4.  We will     admit because of her pancreatitis.  Also treat the UTI while she is     in the hospital.  She is agreeable to alcohol detox. 3. Alcohol abuse.  The patient admits to having a drinking problem.     Desires assistance with alcohol withdrawal.  The patient will be     admitted for pancreatitis and goes through alcohol detox during     this hospitalization.  CURRENT MEDICATIONS: 1. Flonase 15 mcg 2 puffs once a day. 2. Nystatin 100,000 units per mL suspension, 4 mL 4 times a day swish     and swallow. 3. Klor-Con 20 mEq 3 tablets a day. 4. Prozac 40 mg 2 tablets once a day. 5. Tegretol 200 mg 2 b.i.d. 6. Clonazepam 1 mg 1 t.i.d. p.r.n. 7. Haldol 10 mg 1 at bedtime. 8. Trazodone 100 mg 1 tablet at bedtime. 9. Prilosec OTC 20 mg 1 tablet a day. 10.Symbicort 160/4.5 mcg 2  puffs twice a day. 11.Albuterol sulfate HFA 180 mcg. 12.Aerosol solution 2 puffs 4 times a day as needed.  MEDICAL HISTORY: 1. Asthma, persistent 2. GERD. 3. Bipolar disorder. 4. Borderline personality.  DRUG ALLERGIES: 1. ERYTHROMYCIN, difficulty breathing. 2. SULFA, difficulty breathing. 3. NAPROSYN, stomach upset.  SURGICAL HISTORY:  Hernia repair in 1996.  FAMILY HISTORY:  Father alive, history of alcoholism and hypertension. Mother alive, history of COPD and muscular dystrophy.  One brother alive with history of gout.  SOCIAL HISTORY:  She is a smoker approximately a pack a year 30. Alcohol intake daily (a lot).  Recreational drug use including cocaine, heroin, crack.  Denies drug use in the last 5 years.  Unemployed. Formally worked in [employer] and has been employed by [employer] in  the past.  She is separated.  PHYSICAL EXAMINATION:  VITAL SIGNS:  Weight 184.2, height 97.5.  BMI 28.42.  Temperature 98.9.  Pulse 68.  Blood pressure 120/80. GENERAL APPEARANCE:  Alert, chronically ill appearing.  Disheveled. Smells of alcohol. HEENT:  Grossly normal.  Canals clear.  Bilaterally injected conjunctivae.  Pupils equal and reactive to light.  Dentition normal. Tongue coated. NECK:  Supple with full range of motion.  Without evidence of thyromegaly, adenopathy, or bruit. CHEST:  Wheezing throughout, did not clear with cough. HEART:  Normal S1, S2 without murmurs, rubs, gallops, or click. ABDOMEN:  Diffusely tender without masses, organomegaly, rebound, or peritonitis. RECTAL:  Not done. MUSCULOSKELETAL:  Moves all extremities x4.  ASSESSMENT: 1. Acidosis, probably alcoholic.  Associated with elevated glucose,     low potassium.  Anion gap of 19.  CMET, urinalysis, hemoglobin     drawn today.  See attached. 2. Abdominal pain, generalized, alcohol use daily.  Pancreatitis was     verified with a lipase of 112.  Also has urinary tract infection. 3.  Alcohol abuse.  Request alcohol detox.  This will be initiated in     the hospital.  Consult with Dr. Kirby Funk.     Lavinia Sharps, N.P.   ______________________________ Thora Lance, M.D.    MAP/MEDQ  D:  05/30/2010  T:  05/31/2010  Job:  [id number]  Electronically Signed by Mee Hives N.P. on 06/13/2010 09:13:23 AM Electronically Signed by Kirby Funk M.D. on 06/15/2010 06:50:27 PM

## 2010-06-23 LAB — RAPID URINE DRUG SCREEN, HOSP PERFORMED
Amphetamines: NOT DETECTED
Amphetamines: NOT DETECTED
Barbiturates: NOT DETECTED
Barbiturates: NOT DETECTED
Benzodiazepines: NOT DETECTED
Benzodiazepines: NOT DETECTED
Cocaine: NOT DETECTED
Cocaine: NOT DETECTED
Opiates: NOT DETECTED
Opiates: NOT DETECTED
Tetrahydrocannabinol: NOT DETECTED
Tetrahydrocannabinol: NOT DETECTED

## 2010-06-23 LAB — CBC
HCT: 37 % (ref 36.0–46.0)
HCT: 37.3 % (ref 36.0–46.0)
Hemoglobin: 12.6 g/dL (ref 12.0–15.0)
Hemoglobin: 12.7 g/dL (ref 12.0–15.0)
MCHC: 34 g/dL (ref 30.0–36.0)
MCHC: 34.2 g/dL (ref 30.0–36.0)
MCV: 100.7 fL — ABNORMAL HIGH (ref 78.0–100.0)
MCV: 100.8 fL — ABNORMAL HIGH (ref 78.0–100.0)
Platelets: 208 10*3/uL (ref 150–400)
Platelets: 216 10*3/uL (ref 150–400)
RBC: 3.67 MIL/uL — ABNORMAL LOW (ref 3.87–5.11)
RBC: 3.7 MIL/uL — ABNORMAL LOW (ref 3.87–5.11)
RDW: 14.7 % (ref 11.5–15.5)
RDW: 15 % (ref 11.5–15.5)
WBC: 6.2 10*3/uL (ref 4.0–10.5)
WBC: 7.7 10*3/uL (ref 4.0–10.5)

## 2010-06-23 LAB — POCT PREGNANCY, URINE: Preg Test, Ur: NEGATIVE

## 2010-06-23 LAB — URINALYSIS, ROUTINE W REFLEX MICROSCOPIC
Bilirubin Urine: NEGATIVE
Bilirubin Urine: NEGATIVE
Glucose, UA: NEGATIVE mg/dL
Glucose, UA: NEGATIVE mg/dL
Ketones, ur: NEGATIVE mg/dL
Ketones, ur: NEGATIVE mg/dL
Leukocytes, UA: NEGATIVE
Leukocytes, UA: NEGATIVE
Nitrite: NEGATIVE
Nitrite: NEGATIVE
Protein, ur: NEGATIVE mg/dL
Protein, ur: NEGATIVE mg/dL
Specific Gravity, Urine: 1.005 (ref 1.005–1.030)
Specific Gravity, Urine: 1.007 (ref 1.005–1.030)
Urobilinogen, UA: 0.2 mg/dL (ref 0.0–1.0)
Urobilinogen, UA: 0.2 mg/dL (ref 0.0–1.0)
pH: 6.5 (ref 5.0–8.0)
pH: 6.5 (ref 5.0–8.0)

## 2010-06-23 LAB — DIFFERENTIAL
Basophils Absolute: 0.1 10*3/uL (ref 0.0–0.1)
Basophils Absolute: 0.1 10*3/uL (ref 0.0–0.1)
Basophils Relative: 1 % (ref 0–1)
Basophils Relative: 2 % — ABNORMAL HIGH (ref 0–1)
Eosinophils Absolute: 0 10*3/uL (ref 0.0–0.7)
Eosinophils Absolute: 0.1 10*3/uL (ref 0.0–0.7)
Eosinophils Relative: 1 % (ref 0–5)
Eosinophils Relative: 1 % (ref 0–5)
Lymphocytes Relative: 30 % (ref 12–46)
Lymphocytes Relative: 48 % — ABNORMAL HIGH (ref 12–46)
Lymphs Abs: 2.3 10*3/uL (ref 0.7–4.0)
Lymphs Abs: 2.9 10*3/uL (ref 0.7–4.0)
Monocytes Absolute: 0.3 10*3/uL (ref 0.1–1.0)
Monocytes Absolute: 0.4 10*3/uL (ref 0.1–1.0)
Monocytes Relative: 4 % (ref 3–12)
Monocytes Relative: 6 % (ref 3–12)
Neutro Abs: 2.8 10*3/uL (ref 1.7–7.7)
Neutro Abs: 4.8 10*3/uL (ref 1.7–7.7)
Neutrophils Relative %: 45 % (ref 43–77)
Neutrophils Relative %: 63 % (ref 43–77)

## 2010-06-23 LAB — POCT I-STAT, CHEM 8
BUN: 6 mg/dL (ref 6–23)
Calcium, Ion: 1.06 mmol/L — ABNORMAL LOW (ref 1.12–1.32)
Chloride: 108 mEq/L (ref 96–112)
Creatinine, Ser: 0.6 mg/dL (ref 0.4–1.2)
Glucose, Bld: 126 mg/dL — ABNORMAL HIGH (ref 70–99)
HCT: 40 % (ref 36.0–46.0)
Hemoglobin: 13.6 g/dL (ref 12.0–15.0)
Potassium: 3.3 mEq/L — ABNORMAL LOW (ref 3.5–5.1)
Sodium: 137 mEq/L (ref 135–145)
TCO2: 16 mmol/L (ref 0–100)

## 2010-06-23 LAB — BASIC METABOLIC PANEL
BUN: 11 mg/dL (ref 6–23)
CO2: 23 mEq/L (ref 19–32)
Calcium: 9.6 mg/dL (ref 8.4–10.5)
Chloride: 104 mEq/L (ref 96–112)
Creatinine, Ser: 0.74 mg/dL (ref 0.4–1.2)
GFR calc Af Amer: >60 mL/min (ref >60)
GFR calc non Af Amer: >60 mL/min (ref >60)
Glucose, Bld: 102 mg/dL — ABNORMAL HIGH (ref 70–99)
Potassium: 3.7 mEq/L (ref 3.5–5.1)
Sodium: 142 mEq/L (ref 135–145)

## 2010-06-23 LAB — URINE MICROSCOPIC-ADD ON

## 2010-06-23 LAB — CARBAMAZEPINE LEVEL, TOTAL: Carbamazepine Lvl: 8.2 ug/mL (ref 4.0–12.0)

## 2010-06-23 LAB — ETHANOL
Alcohol, Ethyl (B): 213 mg/dL — ABNORMAL HIGH (ref 0–10)
Alcohol, Ethyl (B): 296 mg/dL — ABNORMAL HIGH (ref 0–10)

## 2010-06-25 LAB — RAPID URINE DRUG SCREEN, HOSP PERFORMED
Amphetamines: NOT DETECTED
Barbiturates: NOT DETECTED
Benzodiazepines: NOT DETECTED
Cocaine: NOT DETECTED
Opiates: NOT DETECTED
Tetrahydrocannabinol: NOT DETECTED

## 2010-06-25 LAB — DIFFERENTIAL
Basophils Absolute: 0.1 10*3/uL (ref 0.0–0.1)
Basophils Relative: 2 % — ABNORMAL HIGH (ref 0–1)
Eosinophils Absolute: 0.1 10*3/uL (ref 0.0–0.7)
Eosinophils Relative: 1 % (ref 0–5)
Lymphocytes Relative: 59 % — ABNORMAL HIGH (ref 12–46)
Lymphs Abs: 3.1 10*3/uL (ref 0.7–4.0)
Monocytes Absolute: 0.2 10*3/uL (ref 0.1–1.0)
Monocytes Relative: 4 % (ref 3–12)
Neutro Abs: 1.8 10*3/uL (ref 1.7–7.7)
Neutrophils Relative %: 34 % — ABNORMAL LOW (ref 43–77)

## 2010-06-25 LAB — BASIC METABOLIC PANEL
BUN: 11 mg/dL (ref 6–23)
CO2: 20 mEq/L (ref 19–32)
Calcium: 9.2 mg/dL (ref 8.4–10.5)
Chloride: 106 mEq/L (ref 96–112)
Creatinine, Ser: 0.63 mg/dL (ref 0.4–1.2)
GFR calc Af Amer: >60 mL/min (ref >60)
GFR calc non Af Amer: >60 mL/min (ref >60)
Glucose, Bld: 107 mg/dL — ABNORMAL HIGH (ref 70–99)
Potassium: 3.8 mEq/L (ref 3.5–5.1)
Sodium: 142 mEq/L (ref 135–145)

## 2010-06-25 LAB — CBC
HCT: 36.6 % (ref 36.0–46.0)
Hemoglobin: 12.7 g/dL (ref 12.0–15.0)
MCHC: 34.8 g/dL (ref 30.0–36.0)
MCV: 97.2 fL (ref 78.0–100.0)
Platelets: 208 10*3/uL (ref 150–400)
RBC: 3.76 MIL/uL — ABNORMAL LOW (ref 3.87–5.11)
RDW: 15.6 % — ABNORMAL HIGH (ref 11.5–15.5)
WBC: 5.2 10*3/uL (ref 4.0–10.5)

## 2010-06-25 LAB — ETHANOL: Alcohol, Ethyl (B): 375 mg/dL — ABNORMAL HIGH (ref 0–10)

## 2010-06-25 LAB — CARBAMAZEPINE LEVEL, TOTAL: Carbamazepine Lvl: <2 ug/mL — ABNORMAL LOW (ref 4.0–12.0)

## 2010-06-25 LAB — URINALYSIS, ROUTINE W REFLEX MICROSCOPIC
Bilirubin Urine: NEGATIVE
Glucose, UA: NEGATIVE mg/dL
Leukocytes, UA: NEGATIVE
Nitrite: NEGATIVE
Protein, ur: NEGATIVE mg/dL
Specific Gravity, Urine: 1.012 (ref 1.005–1.030)
Urobilinogen, UA: 0.2 mg/dL (ref 0.0–1.0)
pH: 6 (ref 5.0–8.0)

## 2010-06-25 LAB — PREGNANCY, URINE: Preg Test, Ur: NEGATIVE

## 2010-06-25 LAB — URINE MICROSCOPIC-ADD ON

## 2010-06-26 LAB — DIFFERENTIAL
Basophils Absolute: 0.3 10*3/uL — ABNORMAL HIGH (ref 0.0–0.1)
Basophils Relative: 4 % — ABNORMAL HIGH (ref 0–1)
Eosinophils Absolute: 0.1 10*3/uL (ref 0.0–0.7)
Eosinophils Relative: 1 % (ref 0–5)
Lymphocytes Relative: 42 % (ref 12–46)
Lymphs Abs: 2.6 10*3/uL (ref 0.7–4.0)
Monocytes Absolute: 0.4 10*3/uL (ref 0.1–1.0)
Monocytes Relative: 7 % (ref 3–12)
Neutro Abs: 2.7 10*3/uL (ref 1.7–7.7)
Neutrophils Relative %: 45 % (ref 43–77)

## 2010-06-26 LAB — BASIC METABOLIC PANEL
BUN: 11 mg/dL (ref 6–23)
CO2: 21 mEq/L (ref 19–32)
Calcium: 8.8 mg/dL (ref 8.4–10.5)
Chloride: 105 mEq/L (ref 96–112)
Creatinine, Ser: 0.53 mg/dL (ref 0.4–1.2)
GFR calc Af Amer: >60 mL/min (ref >60)
GFR calc non Af Amer: >60 mL/min (ref >60)
Glucose, Bld: 104 mg/dL — ABNORMAL HIGH (ref 70–99)
Potassium: 4.1 mEq/L (ref 3.5–5.1)
Sodium: 137 mEq/L (ref 135–145)

## 2010-06-26 LAB — RAPID URINE DRUG SCREEN, HOSP PERFORMED
Amphetamines: NOT DETECTED
Barbiturates: NOT DETECTED
Benzodiazepines: NOT DETECTED
Cocaine: NOT DETECTED
Opiates: NOT DETECTED
Tetrahydrocannabinol: NOT DETECTED

## 2010-06-26 LAB — CBC
HCT: 36.4 % (ref 36.0–46.0)
Hemoglobin: 12.7 g/dL (ref 12.0–15.0)
MCHC: 34.8 g/dL (ref 30.0–36.0)
MCV: 95.9 fL (ref 78.0–100.0)
Platelets: 245 10*3/uL (ref 150–400)
RBC: 3.79 MIL/uL — ABNORMAL LOW (ref 3.87–5.11)
RDW: 13.9 % (ref 11.5–15.5)
WBC: 6.1 10*3/uL (ref 4.0–10.5)

## 2010-06-26 LAB — ETHANOL: Alcohol, Ethyl (B): 208 mg/dL — ABNORMAL HIGH (ref 0–10)

## 2010-06-26 LAB — POCT RAPID STREP A (OFFICE): Streptococcus, Group A Screen (Direct): NEGATIVE

## 2010-06-28 LAB — BASIC METABOLIC PANEL
BUN: 10 mg/dL (ref 6–23)
CO2: 20 mEq/L (ref 19–32)
Calcium: 9.1 mg/dL (ref 8.4–10.5)
Chloride: 106 mEq/L (ref 96–112)
Creatinine, Ser: 0.68 mg/dL (ref 0.4–1.2)
GFR calc Af Amer: >60 mL/min (ref >60)
GFR calc non Af Amer: >60 mL/min (ref >60)
Glucose, Bld: 114 mg/dL — ABNORMAL HIGH (ref 70–99)
Potassium: 4 mEq/L (ref 3.5–5.1)
Sodium: 137 mEq/L (ref 135–145)

## 2010-06-28 LAB — DIFFERENTIAL
Basophils Absolute: 0.1 10*3/uL (ref 0.0–0.1)
Basophils Relative: 2 % — ABNORMAL HIGH (ref 0–1)
Eosinophils Absolute: 0.1 10*3/uL (ref 0.0–0.7)
Eosinophils Relative: 2 % (ref 0–5)
Lymphocytes Relative: 43 % (ref 12–46)
Lymphs Abs: 2.3 10*3/uL (ref 0.7–4.0)
Monocytes Absolute: 0.4 10*3/uL (ref 0.1–1.0)
Monocytes Relative: 7 % (ref 3–12)
Neutro Abs: 2.5 10*3/uL (ref 1.7–7.7)
Neutrophils Relative %: 46 % (ref 43–77)

## 2010-06-28 LAB — RAPID URINE DRUG SCREEN, HOSP PERFORMED
Amphetamines: NOT DETECTED
Barbiturates: POSITIVE — AB
Benzodiazepines: NOT DETECTED
Cocaine: NOT DETECTED
Opiates: NOT DETECTED
Tetrahydrocannabinol: NOT DETECTED

## 2010-06-28 LAB — TSH: TSH: 2.913 u[IU]/mL (ref 0.350–4.500)

## 2010-06-28 LAB — CBC
HCT: 40.7 % (ref 36.0–46.0)
Hemoglobin: 13.9 g/dL (ref 12.0–15.0)
MCHC: 34.1 g/dL (ref 30.0–36.0)
MCV: 97.5 fL (ref 78.0–100.0)
Platelets: 225 10*3/uL (ref 150–400)
RBC: 4.18 MIL/uL (ref 3.87–5.11)
RDW: 14.3 % (ref 11.5–15.5)
WBC: 5.4 10*3/uL (ref 4.0–10.5)

## 2010-06-28 LAB — PREGNANCY, URINE: Preg Test, Ur: NEGATIVE

## 2010-06-28 LAB — ETHANOL: Alcohol, Ethyl (B): 198 mg/dL — ABNORMAL HIGH (ref 0–10)

## 2010-06-28 LAB — TRICYCLICS SCREEN, URINE: TCA Scrn: NOT DETECTED

## 2010-06-30 ENCOUNTER — Emergency Department (HOSPITAL_COMMUNITY)
Admission: EM | Admit: 2010-06-30 | Discharge: 2010-06-30 | Discharge status: Against Medical Advice | Payer: Self-pay | Attending: Emergency Medicine | Admitting: Emergency Medicine

## 2010-06-30 DIAGNOSIS — F319 Bipolar disorder, unspecified: Secondary | ICD-10-CM | POA: Insufficient documentation

## 2010-06-30 DIAGNOSIS — F1010 Alcohol abuse, uncomplicated: Secondary | ICD-10-CM | POA: Insufficient documentation

## 2010-06-30 LAB — URINALYSIS, ROUTINE W REFLEX MICROSCOPIC
Bilirubin Urine: NEGATIVE
Glucose, UA: NEGATIVE mg/dL
Hgb urine dipstick: NEGATIVE
Ketones, ur: NEGATIVE mg/dL
Nitrite: NEGATIVE
Protein, ur: NEGATIVE mg/dL
Specific Gravity, Urine: 1.012 (ref 1.005–1.030)
Urobilinogen, UA: 0.2 mg/dL (ref 0.0–1.0)
pH: 6 (ref 5.0–8.0)

## 2010-06-30 LAB — COMPREHENSIVE METABOLIC PANEL
ALT: 45 U/L — ABNORMAL HIGH (ref 0–35)
AST: 172 U/L — ABNORMAL HIGH (ref 0–37)
Albumin: 3.8 g/dL (ref 3.5–5.2)
Alkaline Phosphatase: 74 U/L (ref 39–117)
BUN: 5 mg/dL — ABNORMAL LOW (ref 6–23)
CO2: 22 mEq/L (ref 19–32)
Calcium: 8.9 mg/dL (ref 8.4–10.5)
Chloride: 103 mEq/L (ref 96–112)
Creatinine, Ser: 0.51 mg/dL (ref 0.4–1.2)
GFR calc Af Amer: >60 mL/min (ref >60)
GFR calc non Af Amer: >60 mL/min (ref >60)
Glucose, Bld: 169 mg/dL — ABNORMAL HIGH (ref 70–99)
Potassium: 3.8 mEq/L (ref 3.5–5.1)
Sodium: 137 mEq/L (ref 135–145)
Total Bilirubin: 0.5 mg/dL (ref 0.3–1.2)
Total Protein: 7.3 g/dL (ref 6.0–8.3)

## 2010-06-30 LAB — CBC
HCT: 39.1 % (ref 36.0–46.0)
Hemoglobin: 13.5 g/dL (ref 12.0–15.0)
MCH: 34 pg (ref 26.0–34.0)
MCHC: 34.5 g/dL (ref 30.0–36.0)
MCV: 98.5 fL (ref 78.0–100.0)
Platelets: 346 10*3/uL (ref 150–400)
RBC: 3.97 MIL/uL (ref 3.87–5.11)
RDW: 12.8 % (ref 11.5–15.5)
WBC: 8.3 10*3/uL (ref 4.0–10.5)

## 2010-06-30 LAB — DIFFERENTIAL
Basophils Absolute: 0.1 10*3/uL (ref 0.0–0.1)
Basophils Relative: 2 % — ABNORMAL HIGH (ref 0–1)
Eosinophils Absolute: 0.2 10*3/uL (ref 0.0–0.7)
Eosinophils Relative: 2 % (ref 0–5)
Lymphocytes Relative: 59 % — ABNORMAL HIGH (ref 12–46)
Lymphs Abs: 4.9 10*3/uL — ABNORMAL HIGH (ref 0.7–4.0)
Monocytes Absolute: 0.6 10*3/uL (ref 0.1–1.0)
Monocytes Relative: 7 % (ref 3–12)
Neutro Abs: 2.5 10*3/uL (ref 1.7–7.7)
Neutrophils Relative %: 31 % — ABNORMAL LOW (ref 43–77)

## 2010-06-30 LAB — RAPID URINE DRUG SCREEN, HOSP PERFORMED
Amphetamines: NOT DETECTED
Barbiturates: NOT DETECTED
Benzodiazepines: NOT DETECTED
Cocaine: POSITIVE — AB
Opiates: NOT DETECTED
Tetrahydrocannabinol: NOT DETECTED

## 2010-06-30 LAB — POCT PREGNANCY, URINE: Preg Test, Ur: NEGATIVE

## 2010-06-30 LAB — ETHANOL: Alcohol, Ethyl (B): 369 mg/dL — ABNORMAL HIGH (ref 0–10)

## 2010-08-01 NOTE — H&P (Signed)
NAME:  [name], [name] NO.:  [id number]   MEDICAL RECORD NO.:  [id number]          PATIENT TYPE:  IPS   LOCATION:  0307                          [facility]ILITY[facility]   PHYSICIAN:  Jasmine Pang, M.D. DATE OF BIRTH:  1975-06-03   DATE OF ADMISSION:  06/23/2008  DATE OF DISCHARGE:                       PSYCHIATRIC ADMISSION ASSESSMENT   This is a 35 year old female voluntarily admitted on June 23, 2008.   HISTORY OF PRESENT ILLNESS:  The patient reports a history of self-  inflicted injury, cutting her left arm with a razor blade yesterday. She  has been feeling very overwhelmed, caring for her 70 year old  grandmother.  She is unemployed, having financial issues.  She also  reports that she has been doing some drinking, drinking approximately a  pint daily with her last drink yesterday stating that she drank 2 tall  beers in the morning. She was also trying to get her self Medicaid and  having difficulty obtaining follow-up appointments, and had there has  been some noncompliance with her medications.   PAST PSYCHIATRIC HISTORY:  This is her third admission. The patient was  here approximately 4 years ago.  She sees Dr. Betti Cruz for outpatient  mental health services.   SOCIAL HISTORY:  A 35 year old female.  She states that it is  complicated that she is married and currently separated from her  husband. The patient lives with and cares for her 58 year old  grandmother.  The patient lives in [place].  She has no children and  she is currently unemployed.   FAMILY HISTORY:  None.   ALCOHOL AND DRUG HISTORY:  The patient smokes 2 packs a day.  Has been  drinking approximately a pint daily.  Denies any substance use.   PRIMARY CARE Oaklie Durrett:  None.   MEDICAL PROBLEMS:  History of hypoglycemia.   MEDICATIONS:  Listed is Tegretol 200 mg b.i.d. Prozac 40 mg daily,  Klonopin 1 mg t.i.d., Claritin 10 mg daily, albuterol inhaler, and  Prilosec for heartburn.   DRUG ALLERGIES:  E.E.S., SULFA and NAPROSYN.   PHYSICAL EXAM:  This is an overweight female.  The patient was fully  assessed at [facility] Physical exam was reviewed. Initially  the patient was agitated and anxious.  She has multiple superficial  lacerations to her left arm.  The patient received tetanus injection.  VITAL SIGNS:  Temperature 98, pulse of 106, 20 respirations, blood  pressure is 127/88.   Her laboratory data shows a pregnancy test that is negative. BMET showed  a glucose of 114.  Alcohol level was 198.  CBC was within normal limits.  Urine drug screen was positive for barbiturates   MENTAL STATUS EXAM:  The patient is fully alert, got out of bed readily,  and was cooperative with the interview, good eye contact.  She is  casually dressed.  Speech is clear.  She swears at times but apologizes  for it. Her speech is fluent.  Mood is neutral.  The patient is again  cooperative and pleasant and seems to want help.  Thought process  coherent and goal directed.  No evidence of any delusional statements.  Cognitive function intact.  Her memory appears intact.  Judgment and  insight are fair.  Poor impulse control.   DIAGNOSES:  AXIS I:  Bipolar disorder.  Alcohol abuse.  AXIS II:  Bipolar disorder as noted from the chart.  AXIS III:  Hypoglycemia and asthma.  AXIS IV:  Access to health care, economic issues, occupation.  AXIS V:  Current is 40.   PLANS:  Will contact for safety.  We will stabilize mood and thinking.  We will resume her Tegretol and her Prozac.  Will stop her Klonopin for  right now and put patient on Librium for withdrawal symptoms. Ambien for  sleep.  Will monitor wounds for signs and symptoms of infection.  The  patient will be encouraged to go to group. Case manager will obtain her  follow-up appointments as the patient may benefit from some individual  therapy.  Her tentative length of stay at this time is 2-3 days.      Landry Corporal,  N.P.      Jasmine Pang, M.D.  Electronically Signed    JO/MEDQ  D:  06/24/2008  T:  06/24/2008  Job:  [id number]

## 2010-08-01 NOTE — Discharge Summary (Signed)
NAME:  [name], [name] NO.:  [id number]   MEDICAL RECORD NO.:  [id number]          PATIENT TYPE:  IPS   LOCATION:  0307                          [facility]ILITY[facility]  [facility]   PHYSICIAN:  Jasmine Pang, M.D. DATE OF BIRTH:  1975-05-31   DATE OF ADMISSION:  06/23/2008  DATE OF DISCHARGE:  06/25/2008                               DISCHARGE SUMMARY   IDENTIFICATION:  This is a 35 year old separated female, who was  admitted on a voluntary basis on June 23, 2008.   HISTORY OF PRESENT ILLNESS:  The patient reports a history of self-  inflicted injury, cutting her left arm with a razor blade yesterday.  She was feeling very overwhelmed caring for her 14 year old grandmother.  She is unemployed and having financial issues.  She also reports she has  been doing some drinking (approximately a pint daily).  She states there  has been some noncompliance with her medications.  For further admission  information, see psychiatric admission assessment.   PHYSICAL FINDINGS:  The patient was fully assessed at [facility].  Physical exam was reviewed.  She had multiple superficial  lacerations to her left arm.  She received tetanus injection.  Otherwise, there were no acute physical or medical problems noted.   ADMISSION LABORATORIES:  Pregnancy test was negative.  BMET showed a  glucose of 114.  Alcohol level was 198.  CBC was within normal limits.  Urine drug screen was positive for barbiturates.   HOSPITAL COURSE:  Upon admission, the patient was restarted on her home  medications of Tegretol 200 mg in the morning and at h.s., Prozac 40 mg  daily, Claritin 10 mg daily, albuterol inhaler p.r.n.  She was also  started on Ambien 5 mg p.o. q.h.s. p.r.n. insomnia and Librium 25 mg  p.o. q.6 h. p.r.n. withdrawal.  She was started on BuSpar 5 mg p.o.  t.i.d. and Imodium for diarrhea not to exceed 6 in 24 hours.  She was  started on Protonix 40 mg daily.  In individual sessions, the  patient  stated she had been feeling overwhelmed from taking care of her  grandmother, who also has dementia.  She states no one else in her  family will help.  She wanted some counseling.  She states she has been  at [facility] in the past.  She did state that her husband was supportive.  Upon admission, she was somewhat depressed and anxious, but there was no  suicidal ideation.  No evidence of psychosis or thought disorder.  On  June 25, 2008, sleep was improving.  Appetite was good.  Mood was less  depressed, less anxious.  Affect consistent with mood.  There was no  suicidal or homicidal ideation.  No auditory or visual hallucinations.  No paranoia or delusions.  Thoughts were logical and goal-directed.  Thought content, no predominant theme.  Cognitive grossly intact.  Insight good.  Judgment good.  Impulse control good.  It was felt the  patient was safe for discharge today.   DISCHARGE DIAGNOSES:  Axis I:  Mood disorder not otherwise specified and  also alcohol abuse.  Axis  II:  None.  Axis III:  Hypoglycemia and asthma.  Axis IV:  Severe (access to health care, economic issues, occupation,  difficulty caring for her elderly grandmother who has dementia, burden  of psychiatric illness and chemical dependencies).  Axis V:  Global assessment of functioning was 50 upon discharge.  Global  assessment of functioning was 40 upon admission.  Global assessment of  functioning highest past year was 65-70.   DISCHARGE PLAN:  There was no specific activity level or dietary  restrictions.   POSTHOSPITAL CARE PLANS:  The patient will go to the [facility] on  July 01, 2008, at 11:15 a.m. for a reentry appointment.  She will also  see Dr. Betti Cruz at [facility] and is to make this appointment  herself.   DISCHARGE MEDICATIONS:  1. Tegretol 200 mg 1 twice a day.  2. Prozac 40 mg daily.  3. Claritin 10 mg daily.  4. Prilosec as directed.  5. BuSpar 5 mg 1 three times a day.  6.  Albuterol inhaler as directed.   ADDENDUM  TSH was normal at 2.913.      Jasmine Pang, M.D.  Electronically Signed     BHS/MEDQ  D:  07/08/2008  T:  07/09/2008  Job:  [id number]
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
## 2010-08-01 NOTE — Group Therapy Note (Signed)
NAME:  [name], BIN NO.:  [id number]   MEDICAL RECORD NO.:  [id number]          PATIENT TYPE:  WOC   LOCATION:  [facility]                   FACILITY:  [facility]   PHYSICIAN:  Tinnie Gens, MD        DATE OF BIRTH:  December 22, 1975   DATE OF SERVICE:  07/10/2007                                  CLINIC NOTE   CHIEF COMPLAINT:  IUD insertion, annual exam.   HISTORY OF PRESENT ILLNESS:  The patient is a 35 year old gravida 5,  para 0-0-5-0 with 5 TABs who is known to this service and who would like  that birth control that is reliable.  She has applied for and received  her [employer] IUD.  She additionally desires a Pap smear and  annual exam.   PAST MEDICAL HISTORY:  Peptic ulcer disease.   SURGICAL HISTORY:  1. Hernia repair in 1996.  2. Vaginal laceration repair.   MEDICATIONS:  1. Prozac 20 mg daily.  2. Tegretol 400 mg twice daily.  3. Klonopin 1 mg as needed.   ALLERGIES:  1. NAPROSYN.  2. ERYTHROMYCIN.  3. SULFA.   OBSTETRICAL HISTORY:  G5, P0 with five TABs.   GYN HISTORY:  Menarche at age 29.  Cycles are regular.  __________  history of abnormal Pap, history of HSV outbreak.  History of HPV.   FAMILY HISTORY:  Diabetes, hypertension, heart disease, cancer, bipolar  disorder.   SOCIAL HISTORY:  She is recently married.  She has a history of  addicting drugs, alcohol use and tobacco use.   REVIEW OF SYSTEMS:  A 14 point review of systems reviewed.  She denies  fever, chills, chest pain, abdominal pain, nausea, vomiting, diarrhea or  constipation.   PHYSICAL EXAMINATION:  VITAL SIGNS:  Are noted in the chart.  GENERAL:  She is a well-developed, well-nourished female in no acute  distress.  ABDOMEN:  Soft, nontender, nondistended.  GU:  Normal external female genitalia, BUS normal.  Vagina is pink and  rugated.  Cervix is nulliparous without lesion.   PROCEDURE:  The cervix was cleaned with Betadine x2.  The cervix was  grasped anteriorly  with a single-tooth tenaculum.  It sounded to 7 cm.  A Mirena IUD was placed without difficulty.  Strings were trimmed to 1.5  cm.   IMPRESSION:  1. Yearly exam.  2. Status post IUD insertion for birth control.   PLAN:  1. Continue IUD.  Come back in 2-4 weeks for a check.  May use      ________ as needed for bleeding abnormalities.  2. Pap smear today.           ______________________________  Tinnie Gens, MD     TP/MEDQ  D:  07/10/2007  T:  07/10/2007  Job:  [id number]

## 2010-08-04 NOTE — Group Therapy Note (Signed)
[name], Kellie NO.:  [id number]   MEDICAL RECORD NO.:  [id number]          PATIENT TYPE:  WOC   LOCATION:  [facility]                   FACILITY:  [facility]   PHYSICIAN:  Argentina Donovan, MD        DATE OF BIRTH:  May 24, 1975   DATE OF SERVICE:  09/07/2004                                    CLINIC NOTE   REASON FOR VISIT:  The patient is a 35 year old gravida 5 para 0-0-5-0,  history of five interruptions of pregnancy, last one a year-and-a-half ago  who is bipolar and on Prozac and trazodone. Dr. Betti Cruz is her psychiatrist.  The patient has a difficult life with her live-in partner now in alcohol  rehab and must get out of her apartment soon. She has a long history of  irritable bowel syndrome and is currently on no contraception, although in  the past she had been on the NuvaRing and Yasmin, but stopped because of  cost problems. She has developed over the last month she usually has regular  periods although they tend to be uncomfortable, but since her last one she  has had severe, continual lower abdominal pain across the whole lower part  of the abdomen with sharp stabbing pains that are making her bend over and  are very severe. She denies urinary tract symptoms but this pain is quite  different than what she describes as irritable bowel symptoms.   PHYSICAL EXAMINATION:  GENERAL:  This is an obese white female.  ABDOMEN:  Soft, flat, tender suprapubically according to the patient, but  without guarding and no rebound.  PELVIC:  External genitalia is normal, BUS within normal limits. The vagina  is clean and well rugated. The cervix is clean and nulliparous. The uterus  and adnexa could not be well outlined but the patient did not show any sign  of pain when I was trying to feel the ovaries.   IMPRESSION:  Pelvic pain of unknown etiology. Wet prep and DNA samples were  taken. The patient had a normal Pap smear several months ago.   PLAN:  Ultrasound. If the  pain persists, a diagnostic laparoscopy.       PR/MEDQ  D:  09/07/2004  T:  09/07/2004  Job:  [id number]
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
## 2010-08-04 NOTE — Group Therapy Note (Signed)
[name], [name] NO.:  [id number]   MEDICAL RECORD NO.:  [id number]          PATIENT TYPE:  WOC   LOCATION:  [facility]                   FACILITY:  [facility]   PHYSICIAN:  Tinnie Gens, MD        DATE OF BIRTH:  [birth year]   DATE OF SERVICE:  08/31/2005                                    CLINIC NOTE   CHIEF COMPLAINT:  Genital warts.   SUBJECTIVE:  Patient with history of genital warts in the past was coming in  today with recurrent lesions. Also has a lesion central in the pubic area  that has been changing in quality, no itching reported, no bleeding.   PHYSICAL EXAMINATION:  VITAL SIGNS:  Blood pressure 126/90, pulse 103,  respiratory rate 20, temperature 97.5.  GENERAL:  No acute distress.  GENITAL:  Shows a hyperkeratotic 1 cm very irregular lesion on the pubic  area without any sign of inflammations around it. Also multiple small  _______ lesions changing from 1 mm to 5 mm ___________ bilaterally.   ASSESSMENT:  Genital warts and hyperkeratotic lesion pubic area. We treat  with DC application on both lesions today and the patient to return to  clinic in a couple of weeks if persistent lesions or recurrence.           ______________________________  Tinnie Gens, MD     TP/MEDQ  D:  08/31/2005  T:  08/31/2005  Job:  [id number]

## 2010-08-04 NOTE — Group Therapy Note (Signed)
[name], [name] NO.:  [id number]   MEDICAL RECORD NO.:  [id number]          PATIENT TYPE:  WOC   LOCATION:  [facility]                   [facility]ACILITY:  [facility]   PHYSICIAN:  Tinnie Gens, MD        DATE OF BIRTH:  [birth year]   DATE OF SERVICE:  07/20/2004                                    CLINIC NOTE   CHIEF COMPLAINT:  Referral for genital warts.   HISTORY OF PRESENT ILLNESS:  The patient is a 35 year old gravida 5 para 0  who went in for a herpes outbreak to the [facility] and was found to  have genital warts and she was referred here today for treatment.  Additionally, she has not had a Pap or breast exam or general yearly exam in  the last 3 years. She uses condoms currently for birth control. She has  regular cycles that last 3 days with some pain associated with them.   PAST MEDICAL HISTORY:  Significant for peptic ulcer disease and mental  illness.   SURGICAL HISTORY:  Hernia repair in 1996.   MEDICATIONS:  Prozac, trazodone, Klonopin, Topamax.   ALLERGIES:  NAPROSYN, ERYTHROMYCIN, and SULFA.   OBSTETRICAL HISTORY:  G5 P0.   GYNECOLOGICAL HISTORY:  She uses condoms for birth control. Cycles are  regular. Menarche at age 53. One history of abnormal Pap approximately 5  years ago but no other follow-up noted.   FAMILY HISTORY:  Diabetes, heart disease, hypertension, cancer, bipolar  disorder.   SOCIAL HISTORY:  The patient lives with her boyfriend. She does smoke  approximately two packs per day for the last 12 years. She drinks  approximately three alcoholic drinks a day. She does have a history of  addicting drugs.   REVIEW OF SYSTEMS:  Fourteen-point review of systems reviewed. Please see  GYN history in the chart but, of note, it is positive for bruising, numbness  and weakness in her fingers, frequent headaches, dizzy spells, problems with  hearing, coughing up phlegm, chest pain, nausea, vomiting, occasionally  blood in her stools  related to peptic ulcer disease.   PHYSICAL EXAMINATION TODAY:  GENERAL:  She is an obese white female in no  acute distress.  VITAL SIGNS:  Are as noted in the chart. Blood pressure is mildly elevated  at 138/86.  NECK:  Supple, no thyroid, no lesions noted.  ABDOMEN:  Soft, nontender, nondistended.  BREASTS:  Symmetric with everted nipples. No dominant masses. No  supraclavicular or axillary adenopathy noted.  ABDOMEN:  Soft, nontender, nondistended. No masses.  GENITOURINARY:  She has four small genital warts noted on the right  posterior fourchette. Otherwise, external genitalia were normal. The vagina  is pink and rugated. The cervix is visualized and is nulliparous and without  lesion. The uterus is small, anteverted. She has a mild right-sided adnexal  tenderness but no mass and no left-sided tenderness.   IMPRESSION:  1.  Yearly examination.  2.  Genital warts.   PROCEDURE:  After coating the surrounding area with K-Y Jelly the 80% acetic  acid was applied to the genital  warts.   PLAN:  1.  Pap smear today.  2.  Follow up in 2 weeks for repeat wart treatment as necessary.      TP/MEDQ  D:  07/20/2004  T:  07/20/2004  Job:  [id number]

## 2010-08-04 NOTE — Discharge Summary (Signed)
[name], [name]                           ACCOUNT NO.:  [id number]   MEDICAL RECORD NO.:  [id number]                   PATIENT TYPE:  IPS   LOCATION:  0502                                 FACILITY:  [facility]   PHYSICIAN:  Geoffery Lyons, M.D.                   DATE OF BIRTH:  [birth year]   DATE OF ADMISSION:  10/06/2003  DATE OF DISCHARGE:  10/11/2003                                 DISCHARGE SUMMARY   CHIEF COMPLAINT AND PRESENT ILLNESS:  This was the first admission to [facility] for this 35 year old white female who  presented to the emergency room after taking 30 or 40 Xanax tablets after  having a fight with her boyfriend whom she claimed irritated her.  She got  upset, took the Xanax.  She had had 58 days' sobriety until the relapse.  She had thoughts of cutting herself.   PAST PSYCHIATRIC HISTORY:  Daine Floras, M.D., at [facility] on an outpatient  basis.  This was the first time at [facility].  She had  history of cutting on herself in the past.  She has had Depakote in the past  for mood instability.  She has also had Neurontin and Tegretol.   SUBSTANCE ABUSE HISTORY:  As already stated, history of alcohol and cocaine  abuse.   PAST MEDICAL HISTORY:  Irritable bowel syndrome.   MEDICATIONS:  1. Prozac 20 mg per day.  2. Klonopin 1 mg three times a day.  3. Trazodone 100 mg one half at bedtime.   PHYSICAL EXAMINATION:  Physical examination was performed, failed to show  any acute findings.   LABORATORY DATA:  Blood chemistries were within normal limits.  TSH: Within  normal limits.  Urine pregnancy test was negative.  Drug screen: Positive  for cocaine, benzodiazepines, and barbiturates.   MENTAL STATUS EXAM:  Mental status exam revealed a fully alert, pleasant,  cooperative female, somewhat drowsy but could wake up.  Speech: Normal rate,  production, and tempo.  Mood: Depressed, ashamed of relapsing and overdosing  on Xanax.   Thought processes: Logical, coherent, and relevant; no evidence  of delusional ideas, suicidal ideation without a clear plan, no homicidal  ideation, no evidence of delusions, no hallucinations.  Cognitive: Cognition  was well preserved.   ADMISSION DIAGNOSES:   AXIS I:  1. Mood disorder, not otherwise specified.  2. Cocaine abuse.   AXIS II:  No diagnosis.   AXIS III:  1. Status post Xanax overdose.  2. Irritable bowel syndrome.   AXIS IV:  Moderate.   AXIS V:  Global assessment of functioning upon admission 24, highest global  assessment of functioning in the last year 66.   HOSPITAL COURSE:  She was admitted and started in intensive individual and  group psychotherapy.  She was given trazodone for sleep.  She was detoxified  with Librium, she  was given __________ for nausea, and she was restarted on  Prozac 20 mg per day.  She endorsed that she got upset with the boyfriend,  took and overdose of Xanax.  She endorsed that she could tell he used  cocaine.  She was taking a number of Xanax at a time over the course of some  hours.  She had endorsed prior history of cutting.  She endorsed some  obsessions with knives.  She had Tegretol, Depakote, and Neurontin with no  positive results.  She did endorse that she was hyperactive as a child and  that her mood fluctuated.  She was willing to try a mood stabilizer.  She  was concerned about weight gain so she was started on Topamax 25 mg twice a  day.  We scheduled a family session with the boyfriend.  The session went  pretty well.  She was anxious about leaving the hospital as she had to face  the people at the drug court.  She was anxious about that.  She endorsed  that she had used a lot of different drugs including alcohol, cocaine,  heroin, methadone, and she was working on her abstinence.  On July 25, she  was in full contact with reality, no suicidal ideas, no homicidal ideas, no  hallucinations, no delusions, increased  insight.  Boyfriend was supportive,  was able to clarify the issues.  Overall she felt much better, she was  willing and motivated to pursue further outpatient treatment.   DISCHARGE DIAGNOSES:   AXIS I:  1. Mood disorder, not otherwise specified.  2. Polysubstance abuse in partial remission.   AXIS II:  No diagnosis.   AXIS III:  Irritable bowel syndrome.   AXIS IV:  Moderate.   AXIS V:  Global assessment of functioning upon discharge 50-55.   DISCHARGE MEDICATIONS:  1. Symmetrel 100 mg twice a day.  2. Trazodone 50 mg at night.  3. Prozac 20 mg daily.  4. Topamax 25 mg twice a day.   FOLLOW UP:  She was to follow up with Daine Floras, M.D.                                               Geoffery Lyons, M.D.    IL/MEDQ  D:  11/03/2003  T:  11/03/2003  Job:  [id number]

## 2010-08-04 NOTE — Group Therapy Note (Signed)
NAMELEONORA, GORES NO.:  [id number]   MEDICAL RECORD NO.:  [id number]          PATIENT TYPE:  WOC   LOCATION:  [facility]                   FACILITY:  [facility]   PHYSICIAN:  Dorthula Perfect, MD     DATE OF BIRTH:  1975-12-05   DATE OF SERVICE:                                    CLINIC NOTE   A 35 year old white female, gravida 0, last menstrual period July 9th  returns for followup of genital wart treatment.  She was here about a month  ago and was treated with TCA.  She also has a irregular hyperkeratotic  lesion in the pubic area that was treated with the same TCA.  The patient  believes that it is somewhat smaller.   PHYSICAL EXAMINATION:  VITAL SIGNS:  Blood pressure is normal.  GENERAL:  No acute distress.  PELVIC:  She has a 1 cm irregular hyperkeratotic lesion in the suprapubic  area.  Vulva and outer vagina are completely free of genital warts.   Because she thinks that the TCA has helped the suprapubic lesion I reapplied  it.  Her other option is to have surgical excision.  At the present time she  does not have insurance.   DIAGNOSIS:  Genital warts, resolved.           ______________________________  Dorthula Perfect, MD     ER/MEDQ  D:  10/04/2005  T:  10/04/2005  Job:  [id number]

## 2010-08-04 NOTE — Discharge Summary (Signed)
NAMEHARJIT, Dixon NO.:  [id number]   MEDICAL RECORD NO.:  [id number]          PATIENT TYPE:  OIB   LOCATION:  9305                          FACILITY:  [facility]   PHYSICIAN:  Tilda Burrow, M.D. DATE OF BIRTH:  1975-10-19   DATE OF ADMISSION:  05/14/2007  DATE OF DISCHARGE:  05/15/2007                               DISCHARGE SUMMARY   ADMITTING DIAGNOSIS:  Traumatic vaginal laceration, status post  consensual sexual activity.   DISCHARGE DIAGNOSES:  1. Traumatic vaginal laceration, status post consensual sexual      activity, repaired.  2. Anemia.  3. Hemorrhage.   PROCEDURE:  Repair of a right vaginal sidewall laceration, May 14, 2007.   HISTORY OF PRESENT ILLNESS:  This is a 35 year old female gravida 0,  para 0, last menstrual period 3 weeks ago, sexually active with reliable  condom use, was admitted after consensual sexual activity while drinking  with her significant other.  The patient had traumatic injury to the  right vaginal sidewall and presented to the hospital with heavy gross  hemorrhage per vagina with obvious laceration on the right vaginal  sidewall.  She was taken to the OR with blood pressure 130/85, pulse  118, temperature 98.6, respirations 20.  She went to the operating room  where traumatic repair was performed without difficulty.  She was  crossmatched 2 units of packed cells and considered for transfusion, but  due to vigorous fluid hydration, transfusions are not considered  necessary as per anesthesia.  She underwent vaginal laceration repair  without difficulty.  She was kept overnight.  During that time, she did  not have any significant bleeding.  She had blood alcohol level of 231.  Blood type is O positive.  Following morning, she was discharged home in  stable condition on iron tablets, Vicodin, and Keflex 500 q.i.d. x7  days.   FOLLOWUP:  She will follow up in 1 week to clinic for followup of  laceration  repair.      Tilda Burrow, M.D.  Electronically Signed     JVF/MEDQ  D:  06/12/2007  T:  06/12/2007  Job:  [id number]

## 2010-08-04 NOTE — Group Therapy Note (Signed)
[name], [name] NO.:  [id number]   MEDICAL RECORD NO.:  [id number]          PATIENT TYPE:  WOC   LOCATION:  [facility]                   FACILITY:  [facility]   PHYSICIAN:  Montey Hora, M.D.    DATE OF BIRTH:  1975/07/28   DATE OF SERVICE:                                    CLINIC NOTE   OFFICE NOTE   Seen on January 09, 2006 for a PAP smear.   This is a 35 year old G5, P0 coming in for her annual exam.  She wants  further treatment to a hyperkeratotic lesion on her vulva.  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   PAST MEDICAL HISTORY:  Significant for bipolar disorder and PUD.   PAST SURGICAL HISTORY:  Hernia repair in '96.   MEDICATIONS:  Prozac, trazodone, Klonopin and Topamax.   ALLERGIES:  To sulfa, azithromycin, and Naprosyn.   GYN HISTORY:  Uses condoms, regular cycle.  Last PAP one year ago and was  normal. LMP was January 03, 2005 and was normal.   SOCIAL HISTORY:  Has been with the same boyfriend for 5 years, smokes 2  packs a day, drinks alcohol, history of drug use, no current.   PHYSICAL EXAMINATION:  VITAL SIGNS:  Temperature is 97.8, pulse is 90, blood  pressure is 133/88, weight is 193.  GENERAL:  Well nourished, otherwise no acute distress.  HEART:  Regular rate and rhythm without murmur.  LUNGS:  Clear to auscultation bilaterally. Normal respiratory effort.  NECK:  Without lymphadenopathy or thyromegaly.  BREAST:  Exam shows no supraclavicular adenopathy, axillary lymphadenopathy.  No masses bilaterally. No dimpling or asymmetry on sitting.  ABDOMEN:  Soft, nontender.  GENITALIA:  Vaginal area shows normal external genitalia, normal vaginal  mucosa.  Normal nulliparas.  Cervix; uterus about 6 weeks size, anteverted.  No adnexal masses or tenderness bilaterally.  SKIN:  Shows a slightly gray, slightly pigmented hyperkeratotic lesion in  the area of the lower abdomen superior  to the pelvis.   ASSESSMENT/PLAN:  1. Well woman check-up. No significant findings.  PAP smear sent.  New      prescription for acyclovir 800 mg by mouth b.i.d. x 5 days, as needed      for outbreak, #10 refills for a year.  2. Hyperkeratotic lesion has had several treatments with PCA and desires      further, so applied that today.   FOLLOW UP:  In one year.           ______________________________  Montey Hora, M.D.     KR/MEDQ  D:  01/09/2006  T:  01/10/2006  Job:  [id number]

## 2010-10-09 ENCOUNTER — Inpatient Hospital Stay (HOSPITAL_COMMUNITY)
Admission: EM | Admit: 2010-10-09 | Discharge: 2010-10-14 | DRG: 433 | Disposition: A | Discharge status: Home / Self Care | Payer: Self-pay | Attending: Internal Medicine | Admitting: Internal Medicine

## 2010-10-09 DIAGNOSIS — K2920 Alcoholic gastritis without bleeding: Secondary | ICD-10-CM | POA: Diagnosis present

## 2010-10-09 DIAGNOSIS — D649 Anemia, unspecified: Secondary | ICD-10-CM | POA: Diagnosis present

## 2010-10-09 DIAGNOSIS — R17 Unspecified jaundice: Secondary | ICD-10-CM | POA: Diagnosis present

## 2010-10-09 DIAGNOSIS — F603 Borderline personality disorder: Secondary | ICD-10-CM | POA: Diagnosis present

## 2010-10-09 DIAGNOSIS — F3130 Bipolar disorder, current episode depressed, mild or moderate severity, unspecified: Secondary | ICD-10-CM | POA: Diagnosis present

## 2010-10-09 DIAGNOSIS — J45909 Unspecified asthma, uncomplicated: Secondary | ICD-10-CM | POA: Diagnosis present

## 2010-10-09 DIAGNOSIS — K208 Other esophagitis without bleeding: Secondary | ICD-10-CM | POA: Diagnosis present

## 2010-10-09 DIAGNOSIS — N390 Urinary tract infection, site not specified: Secondary | ICD-10-CM | POA: Diagnosis present

## 2010-10-09 DIAGNOSIS — E876 Hypokalemia: Secondary | ICD-10-CM | POA: Diagnosis present

## 2010-10-09 DIAGNOSIS — K7010 Alcoholic hepatitis without ascites: Principal | ICD-10-CM | POA: Diagnosis present

## 2010-10-09 DIAGNOSIS — F1020 Alcohol dependence, uncomplicated: Secondary | ICD-10-CM | POA: Diagnosis present

## 2010-10-09 DIAGNOSIS — R112 Nausea with vomiting, unspecified: Secondary | ICD-10-CM | POA: Diagnosis present

## 2010-10-09 DIAGNOSIS — R195 Other fecal abnormalities: Secondary | ICD-10-CM | POA: Diagnosis present

## 2010-10-09 DIAGNOSIS — M109 Gout, unspecified: Secondary | ICD-10-CM | POA: Diagnosis present

## 2010-10-10 ENCOUNTER — Inpatient Hospital Stay (HOSPITAL_COMMUNITY): Payer: Self-pay | Attending: Internal Medicine

## 2010-10-10 ENCOUNTER — Emergency Department (HOSPITAL_COMMUNITY): Payer: Self-pay

## 2010-10-10 ENCOUNTER — Inpatient Hospital Stay (HOSPITAL_COMMUNITY): Payer: Self-pay

## 2010-10-10 DIAGNOSIS — R112 Nausea with vomiting, unspecified: Secondary | ICD-10-CM

## 2010-10-10 DIAGNOSIS — F1010 Alcohol abuse, uncomplicated: Secondary | ICD-10-CM

## 2010-10-10 DIAGNOSIS — K7010 Alcoholic hepatitis without ascites: Secondary | ICD-10-CM

## 2010-10-10 DIAGNOSIS — D649 Anemia, unspecified: Secondary | ICD-10-CM

## 2010-10-10 LAB — IRON AND TIBC
Iron: 301 ug/dL — ABNORMAL HIGH (ref 42–135)
UIBC: <55 ug/dL

## 2010-10-10 LAB — CBC
HCT: 24.2 % — ABNORMAL LOW (ref 36.0–46.0)
HCT: 21.1 % — ABNORMAL LOW (ref 36.0–46.0)
HCT: 19.9 % — ABNORMAL LOW (ref 36.0–46.0)
Hemoglobin: 8.3 g/dL — ABNORMAL LOW (ref 12.0–15.0)
Hemoglobin: 7.2 g/dL — ABNORMAL LOW (ref 12.0–15.0)
Hemoglobin: 6.5 g/dL — CL (ref 12.0–15.0)
MCH: 33.7 pg (ref 26.0–34.0)
MCH: 32.9 pg (ref 26.0–34.0)
MCH: 31.9 pg (ref 26.0–34.0)
MCHC: 34.3 g/dL (ref 30.0–36.0)
MCHC: 34.1 g/dL (ref 30.0–36.0)
MCHC: 32.7 g/dL (ref 30.0–36.0)
MCV: 98.4 fL (ref 78.0–100.0)
MCV: 96.3 fL (ref 78.0–100.0)
MCV: 97.5 fL (ref 78.0–100.0)
Platelets: 274 10*3/uL (ref 150–400)
Platelets: 212 10*3/uL (ref 150–400)
Platelets: 205 10*3/uL (ref 150–400)
RBC: 2.46 MIL/uL — ABNORMAL LOW (ref 3.87–5.11)
RBC: 2.19 MIL/uL — ABNORMAL LOW (ref 3.87–5.11)
RBC: 2.04 MIL/uL — ABNORMAL LOW (ref 3.87–5.11)
RDW: 18.6 % — ABNORMAL HIGH (ref 11.5–15.5)
RDW: 18.5 % — ABNORMAL HIGH (ref 11.5–15.5)
RDW: 18.3 % — ABNORMAL HIGH (ref 11.5–15.5)
WBC: 11.2 10*3/uL — ABNORMAL HIGH (ref 4.0–10.5)
WBC: 8.7 10*3/uL (ref 4.0–10.5)
WBC: 7.2 10*3/uL (ref 4.0–10.5)

## 2010-10-10 LAB — GLUCOSE, CAPILLARY
Glucose-Capillary: 120 mg/dL — ABNORMAL HIGH (ref 70–99)
Glucose-Capillary: 135 mg/dL — ABNORMAL HIGH (ref 70–99)
Glucose-Capillary: 89 mg/dL (ref 70–99)
Glucose-Capillary: 99 mg/dL (ref 70–99)

## 2010-10-10 LAB — URINALYSIS, ROUTINE W REFLEX MICROSCOPIC
Glucose, UA: NEGATIVE mg/dL
Hgb urine dipstick: NEGATIVE
Ketones, ur: 15 mg/dL — AB
Nitrite: POSITIVE — AB
Protein, ur: 30 mg/dL — AB
Specific Gravity, Urine: 1.015 (ref 1.005–1.030)
Urobilinogen, UA: 4 mg/dL — ABNORMAL HIGH (ref 0.0–1.0)
pH: 7 (ref 5.0–8.0)

## 2010-10-10 LAB — BLOOD GAS, ARTERIAL
Acid-Base Excess: 3.3 mmol/L — ABNORMAL HIGH (ref 0.0–2.0)
Bicarbonate: 27.3 mEq/L — ABNORMAL HIGH (ref 20.0–24.0)
Drawn by: 249101
O2 Content: 2 L/min
O2 Saturation: 92.7 %
Patient temperature: 98.6
TCO2: 28.6 mmol/L (ref 0–100)
pCO2 arterial: 41.4 mmHg (ref 35.0–45.0)
pH, Arterial: 7.435 — ABNORMAL HIGH (ref 7.350–7.400)
pO2, Arterial: 70.5 mmHg — ABNORMAL LOW (ref 80.0–100.0)

## 2010-10-10 LAB — HEPATIC FUNCTION PANEL
ALT: 12 U/L (ref 0–35)
AST: 145 U/L — ABNORMAL HIGH (ref 0–37)
Albumin: 3.4 g/dL — ABNORMAL LOW (ref 3.5–5.2)
Alkaline Phosphatase: 116 U/L (ref 39–117)
Bilirubin, Direct: 2.7 mg/dL — ABNORMAL HIGH (ref 0.0–0.3)
Indirect Bilirubin: 2.1 mg/dL — ABNORMAL HIGH (ref 0.3–0.9)
Total Bilirubin: 4.8 mg/dL — ABNORMAL HIGH (ref 0.3–1.2)
Total Protein: 7.4 g/dL (ref 6.0–8.3)

## 2010-10-10 LAB — RAPID URINE DRUG SCREEN, HOSP PERFORMED
Amphetamines: NOT DETECTED
Barbiturates: NOT DETECTED
Benzodiazepines: NOT DETECTED
Cocaine: NOT DETECTED
Opiates: NOT DETECTED
Tetrahydrocannabinol: NOT DETECTED

## 2010-10-10 LAB — FOLATE: Folate: 18.6 ng/mL

## 2010-10-10 LAB — MRSA PCR SCREENING: MRSA by PCR: NEGATIVE

## 2010-10-10 LAB — OCCULT BLOOD, POC DEVICE: Fecal Occult Bld: POSITIVE

## 2010-10-10 LAB — BASIC METABOLIC PANEL
BUN: 10 mg/dL (ref 6–23)
BUN: 11 mg/dL (ref 6–23)
CO2: 22 mEq/L (ref 19–32)
CO2: 27 mEq/L (ref 19–32)
Calcium: 7.2 mg/dL — ABNORMAL LOW (ref 8.4–10.5)
Calcium: 8.6 mg/dL (ref 8.4–10.5)
Chloride: 82 mEq/L — ABNORMAL LOW (ref 96–112)
Chloride: 93 mEq/L — ABNORMAL LOW (ref 96–112)
Creatinine, Ser: 0.57 mg/dL (ref 0.50–1.10)
Creatinine, Ser: <0.47 mg/dL — ABNORMAL LOW (ref 0.50–1.10)
GFR calc Af Amer: >60 mL/min (ref >60)
GFR calc non Af Amer: >60 mL/min (ref >60)
Glucose, Bld: 177 mg/dL — ABNORMAL HIGH (ref 70–99)
Glucose, Bld: 92 mg/dL (ref 70–99)
Potassium: 2.7 mEq/L — CL (ref 3.5–5.1)
Potassium: 3.5 mEq/L (ref 3.5–5.1)
Sodium: 127 mEq/L — ABNORMAL LOW (ref 135–145)
Sodium: 131 mEq/L — ABNORMAL LOW (ref 135–145)

## 2010-10-10 LAB — DIFFERENTIAL
Basophils Absolute: 0.1 10*3/uL (ref 0.0–0.1)
Basophils Relative: 1 % (ref 0–1)
Eosinophils Absolute: 0 10*3/uL (ref 0.0–0.7)
Eosinophils Relative: 0 % (ref 0–5)
Lymphocytes Relative: 28 % (ref 12–46)
Lymphs Abs: 3.1 10*3/uL (ref 0.7–4.0)
Monocytes Absolute: 0.4 10*3/uL (ref 0.1–1.0)
Monocytes Relative: 4 % (ref 3–12)
Neutro Abs: 7.6 10*3/uL (ref 1.7–7.7)
Neutrophils Relative %: 67 % (ref 43–77)
Smear Review: ADEQUATE

## 2010-10-10 LAB — APTT: aPTT: 30 seconds (ref 24–37)

## 2010-10-10 LAB — PROTIME-INR
INR: 1.03 (ref 0.00–1.49)
Prothrombin Time: 13.7 seconds (ref 11.6–15.2)

## 2010-10-10 LAB — ABO/RH: ABO/RH(D): O POS

## 2010-10-10 LAB — URINE MICROSCOPIC-ADD ON

## 2010-10-10 LAB — MAGNESIUM: Magnesium: 1.7 mg/dL (ref 1.5–2.5)

## 2010-10-10 LAB — D-DIMER, QUANTITATIVE: D-Dimer, Quant: 0.44 ug/mL-FEU (ref 0.00–0.48)

## 2010-10-10 LAB — FERRITIN: Ferritin: 4044 ng/mL — ABNORMAL HIGH (ref 10–291)

## 2010-10-10 LAB — SALICYLATE LEVEL: Salicylate Lvl: <2 mg/dL — ABNORMAL LOW (ref 2.8–20.0)

## 2010-10-10 LAB — VITAMIN B12: Vitamin B-12: 351 pg/mL (ref 211–911)

## 2010-10-10 LAB — LIPASE, BLOOD: Lipase: 77 U/L — ABNORMAL HIGH (ref 11–59)

## 2010-10-10 LAB — AMMONIA: Ammonia: 49 umol/L (ref 11–60)

## 2010-10-10 LAB — POCT PREGNANCY, URINE: Preg Test, Ur: NEGATIVE

## 2010-10-10 LAB — TSH: TSH: 3.421 u[IU]/mL (ref 0.350–4.500)

## 2010-10-10 LAB — ETHANOL: Alcohol, Ethyl (B): <11 mg/dL (ref 0–11)

## 2010-10-10 MED ORDER — TECHNETIUM TC 99M MEBROFENIN IV KIT
5.5000 | PACK | Freq: Once | INTRAVENOUS | Status: AC | PRN
  Administered 2010-10-10: 6 via INTRAVENOUS

## 2010-10-10 MED ORDER — IOHEXOL 300 MG/ML  SOLN
80.0000 mL | Freq: Once | INTRAMUSCULAR | Status: AC | PRN
  Administered 2010-10-10: 80 mL via INTRAVENOUS

## 2010-10-11 DIAGNOSIS — K208 Other esophagitis without bleeding: Secondary | ICD-10-CM

## 2010-10-11 DIAGNOSIS — F1010 Alcohol abuse, uncomplicated: Secondary | ICD-10-CM

## 2010-10-11 DIAGNOSIS — R112 Nausea with vomiting, unspecified: Secondary | ICD-10-CM

## 2010-10-11 DIAGNOSIS — D649 Anemia, unspecified: Secondary | ICD-10-CM

## 2010-10-11 DIAGNOSIS — K7010 Alcoholic hepatitis without ascites: Secondary | ICD-10-CM

## 2010-10-11 DIAGNOSIS — R195 Other fecal abnormalities: Secondary | ICD-10-CM

## 2010-10-11 LAB — CBC
HCT: 25.7 % — ABNORMAL LOW (ref 36.0–46.0)
HCT: 26 % — ABNORMAL LOW (ref 36.0–46.0)
HCT: 28.4 % — ABNORMAL LOW (ref 36.0–46.0)
Hemoglobin: 8.7 g/dL — ABNORMAL LOW (ref 12.0–15.0)
Hemoglobin: 8.9 g/dL — ABNORMAL LOW (ref 12.0–15.0)
Hemoglobin: 9.4 g/dL — ABNORMAL LOW (ref 12.0–15.0)
MCH: 31.3 pg (ref 26.0–34.0)
MCH: 31.8 pg (ref 26.0–34.0)
MCH: 31.9 pg (ref 26.0–34.0)
MCHC: 33.1 g/dL (ref 30.0–36.0)
MCHC: 33.9 g/dL (ref 30.0–36.0)
MCHC: 34.2 g/dL (ref 30.0–36.0)
MCV: 92.9 fL (ref 78.0–100.0)
MCV: 94.1 fL (ref 78.0–100.0)
MCV: 94.7 fL (ref 78.0–100.0)
Platelets: 186 10*3/uL (ref 150–400)
Platelets: 192 10*3/uL (ref 150–400)
Platelets: 220 10*3/uL (ref 150–400)
RBC: 2.73 MIL/uL — ABNORMAL LOW (ref 3.87–5.11)
RBC: 2.8 MIL/uL — ABNORMAL LOW (ref 3.87–5.11)
RBC: 3 MIL/uL — ABNORMAL LOW (ref 3.87–5.11)
RDW: 20 % — ABNORMAL HIGH (ref 11.5–15.5)
RDW: 21 % — ABNORMAL HIGH (ref 11.5–15.5)
RDW: 21.4 % — ABNORMAL HIGH (ref 11.5–15.5)
WBC: 6.2 10*3/uL (ref 4.0–10.5)
WBC: 7.3 10*3/uL (ref 4.0–10.5)
WBC: 8.4 10*3/uL (ref 4.0–10.5)

## 2010-10-11 LAB — DIFFERENTIAL
Basophils Absolute: 0.1 10*3/uL (ref 0.0–0.1)
Basophils Relative: 1 % (ref 0–1)
Eosinophils Absolute: 0.1 10*3/uL (ref 0.0–0.7)
Eosinophils Relative: 1 % (ref 0–5)
Lymphocytes Relative: 26 % (ref 12–46)
Lymphs Abs: 1.9 10*3/uL (ref 0.7–4.0)
Monocytes Absolute: 0.6 10*3/uL (ref 0.1–1.0)
Monocytes Relative: 8 % (ref 3–12)
Neutro Abs: 4.7 10*3/uL (ref 1.7–7.7)
Neutrophils Relative %: 65 % (ref 43–77)

## 2010-10-11 LAB — COMPREHENSIVE METABOLIC PANEL
ALT: 11 U/L (ref 0–35)
AST: 125 U/L — ABNORMAL HIGH (ref 0–37)
Albumin: 2.7 g/dL — ABNORMAL LOW (ref 3.5–5.2)
Alkaline Phosphatase: 84 U/L (ref 39–117)
BUN: 7 mg/dL (ref 6–23)
CO2: 27 mEq/L (ref 19–32)
Calcium: 6.9 mg/dL — ABNORMAL LOW (ref 8.4–10.5)
Chloride: 100 mEq/L (ref 96–112)
Creatinine, Ser: <0.47 mg/dL — ABNORMAL LOW (ref 0.50–1.10)
Glucose, Bld: 86 mg/dL (ref 70–99)
Potassium: 3.6 mEq/L (ref 3.5–5.1)
Sodium: 135 mEq/L (ref 135–145)
Total Bilirubin: 2.7 mg/dL — ABNORMAL HIGH (ref 0.3–1.2)
Total Protein: 5.8 g/dL — ABNORMAL LOW (ref 6.0–8.3)

## 2010-10-11 LAB — GLUCOSE, CAPILLARY
Glucose-Capillary: 105 mg/dL — ABNORMAL HIGH (ref 70–99)
Glucose-Capillary: 108 mg/dL — ABNORMAL HIGH (ref 70–99)
Glucose-Capillary: 168 mg/dL — ABNORMAL HIGH (ref 70–99)
Glucose-Capillary: 74 mg/dL (ref 70–99)
Glucose-Capillary: 91 mg/dL (ref 70–99)
Glucose-Capillary: 92 mg/dL (ref 70–99)

## 2010-10-11 LAB — HEPATITIS PANEL, ACUTE
HCV Ab: NEGATIVE
Hep A IgM: NEGATIVE
Hep B C IgM: NEGATIVE
Hepatitis B Surface Ag: NEGATIVE

## 2010-10-11 LAB — LIPASE, BLOOD: Lipase: 43 U/L (ref 11–59)

## 2010-10-11 NOTE — H&P (Signed)
[name], MERTZ NO.:  [id number]  MEDICAL RECORD NO.:  [id number]  LOCATION:  [facility]                         FACILITY:  [facility]  PHYSICIAN:  Eduard Clos, MDDATE OF BIRTH:  Aug 13, 1975  DATE OF ADMISSION:  10/09/2010 DATE OF DISCHARGE:                             HISTORY & PHYSICAL   PRIMARY CARE PHYSICIAN:  Unassigned, used to be Dr. Kirby Funk.  CHIEF COMPLAINT:  Abdominal pain.  HISTORY OF PRESENT ILLNESS:  A 35 year old female with history of alcoholism, previous history of pancreatitis, history of asthma, depression, bipolar disorder, he comes to the ER because of persistent abdominal pain.  The patient states over the last 2 weeks the patient has not able to eat well and she had been having persistent nausea and vomiting.  Denies any diarrhea, abdominal pain, has been diffuse more than left upper and lower quadrant, in addition, the patient also be found to be anemic.  The patient states she did move her bowels daily, but last 2-days ago, she did more black stools and also some bloody. The patient has some mild shortness of breath.  Denies any chest pain. Denies any fever, chills, cough, or phlegm.  Denies any dizziness or loss of consciousness, any focal deficit.  In the ER, the patient had a CT abdomen and pelvis with contrast, which at this time shows possibility of enteritis versus small bowel obstruction.  The patient had been admitted for further workup on her abdominal pain and anemia with GI bleed and jaundice.  PAST MEDICAL HISTORY:  History of asthma, GERD, bipolar disorder, borderline personality, alcoholism, gout.  PAST SURGICAL HISTORY:  Hernia repair in 1996.  MEDICATIONS PRIOR TO ADMISSION:  The patient takes Symbicort and albuterol.  She is supposed to take Haldol, trazodone, clonazepam, which he has not been taking for almost 4 months now.  ALLERGIES:  SULFA, NAPROXEN, ERYTHROMYCIN.  SOCIAL HISTORY:  The patient at  this time is separated.  Smokes cigarettes, drinks alcohol, has not drank for last week.  Used to use IV drugs.  She has used cocaine 2 months ago.  FAMILY HISTORY:  Father has history of alcoholism and hypertension. Mother history of COPD and muscular dystrophy.  One of her brother has gout.  REVIEW OF SYSTEMS:  As per history of presenting illness, nothing else significant.  PHYSICAL EXAMINATION:  GENERAL:  The patient examined at bedside, not in acute distress. VITAL SIGNS:  Blood pressure 110/76, pulses 120 per minute, respirations 22 per minute, temperature 98.3, O2 sat 97%. HEENT:  Anicteric.  Mild pallor.  No facial asymmetry.  Tongue is midline. CHEST:  Bilateral air entry present.  No rhonchi.  No crepitation. HEART:  S1 and S2 heard. ABDOMEN:  Soft.  There is mild distention, tenderness all over.  Bowel sounds present.  No guarding or rigidity. CNS:  Alert, awake, and oriented to time, place, and person.  Moves upper and lower extremities. EXTREMITIES:  Peripheral pulses felt.  No edema.  LABS:  At this time, the monitor showing sinus tachycardia.  CT of the abdomen and pelvis shows hepatic steatosis, a few small bowel loops showing air-fluid levels, however, no wall thickening or perienteric  inflammation, nonsurgical can be seen with mild enteritis, a partial early bowel obstruction is felt to be unlikely, however, not entirely excluded, normal appendix.  CBC:  WBC is 11.2, hemoglobin is 8.3, hematocrit 24.2, platelets 274.  PT/INR is 13.7 and 1.  Complete metabolic panel:  Sodium 127, potassium 2.7, chloride 82, carbon dioxide 22, anion gap is 23, glucose 177, BUN 11, creatinine 0.5, total bilirubin is 4.8, direct 2.7, indirect is 2.1, alkaline phosphatase 116, AST 145, ALT 12, total protein 7.4, albumin 3.4, calcium 8.6, ammonia 49, lipase 77.  Pregnancy screen is negative.  Drug screen is negative. Alcohol level less than 4.  UA is positive for nitrite,  leukocytes small, hyaline cast present, wbc 7-10, bacteria many, mucus present.  ASSESSMENT: 1. Abdominal pain with possibility of mild pancreatitis. 2. Gastrointestinal bleed. 3. Anemia, problem #2 reason. 4. Possible enteritis versus early small-bowel obstruction. 5. Jaundice. 6. History of alcoholism. 7. History of asthma. 8. History of bipolar disorder.  PLAN: 1. At this time, admit the patient to step-down unit. 2. For abdominal pain, at this time, we do know it is from possible     early pancreatitis versus enteritis versus early small-bowel     obstruction.  We will keep the patient n.p.o.  We will keep the     patient on Cipro and Flagyl for now.  Keep the patient on     antiemetics and pain relief medication. 3. GI bleed.  At this time, the patient is anemic.  Her hemoglobin is     significantly lower than the last time she had measured, in April     2012 it was 13, now it is 8.3.  The patient does say that she     passed black stool, and at this time, guaiac is positive.  I     suggested earlier the patient be n.p.o.  I am going to keep the     patient on Protonix drip.  I will consult GI.  I will recheck a CBC     stat, type and cross match 4 units of PRBC and hold.  Transfuse as     needed.  The patient will be on IV fluids at this time. 4. The patient has metabolic acidosis.  I do not know the exact cause.     At this time, I am going to hydrate the patient.  We are going to     recheck her BMET after correcting her potassium in another few     hours.  We will at this time see if metabolic acidosis is getting     corrected.  I will check her salicylate level and ABG. 5. History of alcoholism, presenting I think the patient is in early     withdrawal stage.  The patient will be kept on alcohol withdrawal     protocol with IV Ativan. 6. Jaundice.  It is showing more of an obstructive pattern.  At this     time, CAT scan is not showing any different     obstruction in  her gallbladder though it shows distant gallbladder.     I will order a HIDA scan at this time. 7. I am going to consult GI for her possible GI bleed and further     recommendation based on clinical course and test orders and     consults recommendations.     Eduard Clos, MD     ANK/MEDQ  D:  10/10/2010  T:  10/10/2010  Job:  [id number]  Electronically Signed by Midge Minium MD on 10/11/2010 01:21:16 AM

## 2010-10-12 DIAGNOSIS — R195 Other fecal abnormalities: Secondary | ICD-10-CM

## 2010-10-12 DIAGNOSIS — R112 Nausea with vomiting, unspecified: Secondary | ICD-10-CM

## 2010-10-12 DIAGNOSIS — D649 Anemia, unspecified: Secondary | ICD-10-CM

## 2010-10-12 DIAGNOSIS — K7010 Alcoholic hepatitis without ascites: Secondary | ICD-10-CM

## 2010-10-12 LAB — CBC
HCT: 24.1 % — ABNORMAL LOW (ref 36.0–46.0)
HCT: 30.6 % — ABNORMAL LOW (ref 36.0–46.0)
Hemoglobin: 7.9 g/dL — ABNORMAL LOW (ref 12.0–15.0)
Hemoglobin: 10.1 g/dL — ABNORMAL LOW (ref 12.0–15.0)
MCH: 31.9 pg (ref 26.0–34.0)
MCH: 31.6 pg (ref 26.0–34.0)
MCHC: 32.8 g/dL (ref 30.0–36.0)
MCHC: 33 g/dL (ref 30.0–36.0)
MCV: 97.2 fL (ref 78.0–100.0)
MCV: 95.6 fL (ref 78.0–100.0)
Platelets: 215 10*3/uL (ref 150–400)
Platelets: 244 10*3/uL (ref 150–400)
RBC: 2.48 MIL/uL — ABNORMAL LOW (ref 3.87–5.11)
RBC: 3.2 MIL/uL — ABNORMAL LOW (ref 3.87–5.11)
RDW: 21.9 % — ABNORMAL HIGH (ref 11.5–15.5)
RDW: 21.4 % — ABNORMAL HIGH (ref 11.5–15.5)
WBC: 6.7 10*3/uL (ref 4.0–10.5)
WBC: 7.1 10*3/uL (ref 4.0–10.5)

## 2010-10-12 LAB — COMPREHENSIVE METABOLIC PANEL
ALT: 12 U/L (ref 0–35)
AST: 127 U/L — ABNORMAL HIGH (ref 0–37)
Albumin: 2.3 g/dL — ABNORMAL LOW (ref 3.5–5.2)
Alkaline Phosphatase: 78 U/L (ref 39–117)
BUN: <3 mg/dL — ABNORMAL LOW (ref 6–23)
CO2: 23 mEq/L (ref 19–32)
Calcium: 6 mg/dL — CL (ref 8.4–10.5)
Chloride: 108 mEq/L (ref 96–112)
Creatinine, Ser: <0.47 mg/dL — ABNORMAL LOW (ref 0.50–1.10)
Glucose, Bld: 79 mg/dL (ref 70–99)
Potassium: 5 mEq/L (ref 3.5–5.1)
Sodium: 138 mEq/L (ref 135–145)
Total Bilirubin: 1.9 mg/dL — ABNORMAL HIGH (ref 0.3–1.2)
Total Protein: 5.2 g/dL — ABNORMAL LOW (ref 6.0–8.3)

## 2010-10-12 LAB — GLUCOSE, CAPILLARY
Glucose-Capillary: 84 mg/dL (ref 70–99)
Glucose-Capillary: 111 mg/dL — ABNORMAL HIGH (ref 70–99)
Glucose-Capillary: 91 mg/dL (ref 70–99)
Glucose-Capillary: 104 mg/dL — ABNORMAL HIGH (ref 70–99)
Glucose-Capillary: 145 mg/dL — ABNORMAL HIGH (ref 70–99)

## 2010-10-13 LAB — COMPREHENSIVE METABOLIC PANEL
ALT: 15 U/L (ref 0–35)
AST: 129 U/L — ABNORMAL HIGH (ref 0–37)
Albumin: 2.5 g/dL — ABNORMAL LOW (ref 3.5–5.2)
Alkaline Phosphatase: 96 U/L (ref 39–117)
BUN: <3 mg/dL — ABNORMAL LOW (ref 6–23)
CO2: 22 mEq/L (ref 19–32)
Calcium: 6.9 mg/dL — ABNORMAL LOW (ref 8.4–10.5)
Chloride: 102 mEq/L (ref 96–112)
Creatinine, Ser: <0.47 mg/dL — ABNORMAL LOW (ref 0.50–1.10)
Glucose, Bld: 158 mg/dL — ABNORMAL HIGH (ref 70–99)
Potassium: 3.7 mEq/L (ref 3.5–5.1)
Sodium: 134 mEq/L — ABNORMAL LOW (ref 135–145)
Total Bilirubin: 2 mg/dL — ABNORMAL HIGH (ref 0.3–1.2)
Total Protein: 5.5 g/dL — ABNORMAL LOW (ref 6.0–8.3)

## 2010-10-13 LAB — CARDIAC PANEL(CRET KIN+CKTOT+MB+TROPI)
CK, MB: 7.4 ng/mL (ref 0.3–4.0)
CK, MB: 8 ng/mL (ref 0.3–4.0)
CK, MB: 8.9 ng/mL (ref 0.3–4.0)
Relative Index: 2.7 — ABNORMAL HIGH (ref 0.0–2.5)
Relative Index: 2.6 — ABNORMAL HIGH (ref 0.0–2.5)
Relative Index: 2.6 — ABNORMAL HIGH (ref 0.0–2.5)
Total CK: 273 U/L — ABNORMAL HIGH (ref 7–177)
Total CK: 306 U/L — ABNORMAL HIGH (ref 7–177)
Total CK: 343 U/L — ABNORMAL HIGH (ref 7–177)
Troponin I: <0.3 ng/mL (ref <0.30)
Troponin I: <0.3 ng/mL (ref <0.30)
Troponin I: <0.3 ng/mL (ref <0.30)

## 2010-10-13 LAB — CBC
HCT: 29.3 % — ABNORMAL LOW (ref 36.0–46.0)
HCT: 29.2 % — ABNORMAL LOW (ref 36.0–46.0)
Hemoglobin: 9.6 g/dL — ABNORMAL LOW (ref 12.0–15.0)
Hemoglobin: 9.5 g/dL — ABNORMAL LOW (ref 12.0–15.0)
MCH: 31.8 pg (ref 26.0–34.0)
MCH: 31.9 pg (ref 26.0–34.0)
MCHC: 32.8 g/dL (ref 30.0–36.0)
MCHC: 32.5 g/dL (ref 30.0–36.0)
MCV: 97 fL (ref 78.0–100.0)
MCV: 98 fL (ref 78.0–100.0)
Platelets: 224 10*3/uL (ref 150–400)
Platelets: 212 10*3/uL (ref 150–400)
RBC: 3.02 MIL/uL — ABNORMAL LOW (ref 3.87–5.11)
RBC: 2.98 MIL/uL — ABNORMAL LOW (ref 3.87–5.11)
RDW: 21.9 % — ABNORMAL HIGH (ref 11.5–15.5)
RDW: 22.1 % — ABNORMAL HIGH (ref 11.5–15.5)
WBC: 7 10*3/uL (ref 4.0–10.5)
WBC: 6.5 10*3/uL (ref 4.0–10.5)

## 2010-10-13 LAB — GLUCOSE, CAPILLARY
Glucose-Capillary: 124 mg/dL — ABNORMAL HIGH (ref 70–99)
Glucose-Capillary: 115 mg/dL — ABNORMAL HIGH (ref 70–99)
Glucose-Capillary: 124 mg/dL — ABNORMAL HIGH (ref 70–99)
Glucose-Capillary: 169 mg/dL — ABNORMAL HIGH (ref 70–99)

## 2010-10-13 LAB — MAGNESIUM: Magnesium: 1.3 mg/dL — ABNORMAL LOW (ref 1.5–2.5)

## 2010-10-13 LAB — CLOSTRIDIUM DIFFICILE BY PCR: Toxigenic C. Difficile by PCR: NEGATIVE

## 2010-10-14 LAB — CROSSMATCH
ABO/RH(D): O POS
Antibody Screen: NEGATIVE
Unit division: 0
Unit division: 0
Unit division: 0
Unit division: 0

## 2010-10-14 LAB — GLUCOSE, CAPILLARY
Glucose-Capillary: 129 mg/dL — ABNORMAL HIGH (ref 70–99)
Glucose-Capillary: 135 mg/dL — ABNORMAL HIGH (ref 70–99)
Glucose-Capillary: 156 mg/dL — ABNORMAL HIGH (ref 70–99)
Glucose-Capillary: 156 mg/dL — ABNORMAL HIGH (ref 70–99)

## 2010-10-14 LAB — COMPREHENSIVE METABOLIC PANEL
ALT: 15 U/L (ref 0–35)
AST: 108 U/L — ABNORMAL HIGH (ref 0–37)
Albumin: 2.5 g/dL — ABNORMAL LOW (ref 3.5–5.2)
Alkaline Phosphatase: 117 U/L (ref 39–117)
BUN: 3 mg/dL — ABNORMAL LOW (ref 6–23)
CO2: 24 mEq/L (ref 19–32)
Calcium: 7.7 mg/dL — ABNORMAL LOW (ref 8.4–10.5)
Chloride: 106 mEq/L (ref 96–112)
Creatinine, Ser: <0.47 mg/dL — ABNORMAL LOW (ref 0.50–1.10)
Glucose, Bld: 128 mg/dL — ABNORMAL HIGH (ref 70–99)
Potassium: 3.7 mEq/L (ref 3.5–5.1)
Sodium: 140 mEq/L (ref 135–145)
Total Bilirubin: 2.1 mg/dL — ABNORMAL HIGH (ref 0.3–1.2)
Total Protein: 5.7 g/dL — ABNORMAL LOW (ref 6.0–8.3)

## 2010-10-14 LAB — CBC
HCT: 29.2 % — ABNORMAL LOW (ref 36.0–46.0)
Hemoglobin: 9.5 g/dL — ABNORMAL LOW (ref 12.0–15.0)
MCH: 31.7 pg (ref 26.0–34.0)
MCHC: 32.5 g/dL (ref 30.0–36.0)
MCV: 97.3 fL (ref 78.0–100.0)
Platelets: 206 10*3/uL (ref 150–400)
RBC: 3 MIL/uL — ABNORMAL LOW (ref 3.87–5.11)
RDW: 22.3 % — ABNORMAL HIGH (ref 11.5–15.5)
WBC: 5.7 10*3/uL (ref 4.0–10.5)

## 2010-10-14 LAB — MAGNESIUM: Magnesium: 1.8 mg/dL (ref 1.5–2.5)

## 2010-10-16 LAB — CULTURE, BLOOD (ROUTINE X 2)
Culture  Setup Time: 201207241232
Culture  Setup Time: 201207241232
Culture: NO GROWTH
Culture: NO GROWTH

## 2010-11-13 NOTE — Discharge Summary (Signed)
[name], LAROUCHE NO.:  [id number]  MEDICAL RECORD NO.:  [id number]  LOCATION:                                 FACILITY:  PHYSICIAN:  Clydia Llano, MD       DATE OF BIRTH:  04-17-1975  DATE OF ADMISSION: DATE OF DISCHARGE:                              DISCHARGE SUMMARY   PRIMARY CARE PHYSICIAN:  [facility]  REASON FOR ADMISSION:  Abdominal pain.  DISCHARGE DIAGNOSES: 1. Alcoholic esophagitis/gastritis. 2. Alcoholic hepatitis. 3. Urinary tract infection. 4. Alcohol abuse. 5. Chronic anemia. 6. Bipolar affective disorder. 7. Gastroesophageal reflux disease. 8. History of asthma.  DISCHARGE MEDICATIONS: 1. Ciprofloxacin 500 mg p.o. b.i.d., take for 4 more days. 2. Protonix 40 mg daily before breakfast, take twice a day for 2     weeks, then once daily. 3. Albuterol inhaler 2 puffs inhaled every 4 hours as needed for     breathing. 4. Clonazepam 1 mg p.o. b.i.d. 5. Multivitamin therapeutic 1 capsule p.o. daily. 6. Symbicort 160/4.5 mcg 2 puffs inhaled b.i.d.  Stop taking the following medication:  Ibuprofen 200 mg 4 tablets every 4 hours as needed for pain and aches.  RADIOLOGY: 1. HIDA scan showed gallbladder fills.  HIDA scan on October 10, 2010,     showed gallbladder fills on delayed basis and indicated that cystic     duct is patent. 2. CT of abdomen and pelvis on October 10, 2010, showed hepatic     steatosis.  There are a few small bowel loops showing air-fluid     level, however, no wall thickening or perienteric inflammation,     nonspecific, however, can be seen in mild enteritis.  A partial or     early bowel obstruction is felt to be unlikely, however, not     entirely excluded.  CONSULTS:  [facility].  BRIEF HISTORY AND EXAMINATION:  [name] is a 35 year old female with history of alcohol use, history of pancreatitis, and asthma.  The patient came in to the hospital complaining about abdominal pain.  The patient  started 2 weeks and then progressively got worse to the point the patient was not able to eat having persistent nausea and vomiting. The patient denies diarrhea or abdominal pain.  The pain is epigastric and upper quadrant, epigastric and diffuse.  The patient was found to be anemic at the time of admission.  For the past 2 days, she had more black stools and more black stools before admission.  BRIEF HOSPITAL COURSE: 1. Alcoholic esophagitis/gastritis.  The patient was admitted to the     hospital for further evaluation.  The abdominal pain is thought to     be secondary to alcoholic esophagitis/hepatitis.  Because the     patient did have positive guaiac and anemia, the patient ended up     with EGD which showed the erosion on the esophagus.  GI recommended     daily PPI.  This is improving and the pain is getting better while     placing the patient into the hospital, at the time of discharge. 2. Alcoholic hepatitis.  The patient did have an AST of 145  with ALT     12, total bili is 4.8 at the time of admission.  Consistent with     alcoholic hepatitis.  The viral hepatitis panel was negative.  The     patient is advised to continue her sobriety.  Now she is 10 days,     did not drink alcohol, which is going to help her alcoholic     hepatitis.  The patient's AST was trending down while she is in the     hospital.  On the day of discharge, it was 108 and ALT is 15. 3. Anemia.  The patient's baseline seems to be about 13.5 in April     2012.  The patient came into the hospital with hemoglobin of 7.2,     dropped with the next blood draws to 6.5.  The patient ended up     with 2 units of blood transfused with a bump of hemoglobin to 9.5     on the day of discharge.  GI was consulted because of positive     Hemoccult and EGD was done.  The EGD showed erosive esophagitis     with recommendation to use PPI daily and the esophageal erosion is     most likely secondary to the vomiting and  likely source of heme-     positive stool and positive underlying GERD as well.  They do     recommend for the patient also to maintain alcohol abstinence. 4. Bipolar affective disorder.  The patient used to be on Haldol,     trazodone, and clonazepam.  The patient is not taking these     medications for the past 4 months.  The patient is taking     clonazepam.  Her mood seems to be stable at this time and so this     medication was not restarted.  The only medications continued is     the clonazepam. 5. Alcohol abuse.  The patient drinks huge amount of hard liquor.  The     patient said she drinks with a partner.  They both drink a gallon     and fifth of hard liquor, usually vodka or whiskey a day, so she     ended when I calculated as 2.25 liters of hard liquor.  At the time     she came in here, she was put on withdrawal protocol.  It seems     like she did not suffer from major symptoms.  She was on the Ativan     on the day of discharge.  She is over 10 days of last time she     drank and the patient has no shaking and has no symptoms of     withdrawal. 6. UTI.  The patient has UTI and is being treated with ciprofloxacin.     For some reason, the urine culture was not back.  The patient was     treated empirically for 5 days with ciprofloxacin.     Clydia Llano, MD     ME/MEDQ  D:  10/14/2010  T:  10/14/2010  Job:  [id number]  cc:   [facility]  Electronically Signed by Clydia Llano  on 11/13/2010 09:58:49 AM

## 2010-12-08 LAB — CBC
HCT: 20 — ABNORMAL LOW
HCT: 30.2 — ABNORMAL LOW
Hemoglobin: 10.8 — ABNORMAL LOW
Hemoglobin: 7.1 — CL
MCHC: 35.5
MCHC: 35.6
MCV: 95
MCV: 95.2
Platelets: 150
Platelets: 197
RBC: 2.11 — ABNORMAL LOW
RBC: 3.17 — ABNORMAL LOW
RDW: 13.2
RDW: 13.3
WBC: 14 — ABNORMAL HIGH
WBC: 7.5

## 2010-12-08 LAB — DIFFERENTIAL
Basophils Absolute: 0.1
Basophils Relative: 1
Eosinophils Absolute: 0.1
Eosinophils Relative: 1
Lymphocytes Relative: 39
Lymphs Abs: 3
Monocytes Absolute: 0.4
Monocytes Relative: 5
Neutro Abs: 4
Neutrophils Relative %: 54

## 2010-12-08 LAB — BASIC METABOLIC PANEL WITH GFR
BUN: 14
CO2: 21
Calcium: 8.7
Chloride: 104
Creatinine, Ser: 0.54
GFR calc non Af Amer: >60
Glucose, Bld: 187 — ABNORMAL HIGH
Potassium: 3.9
Sodium: 137

## 2010-12-08 LAB — CROSSMATCH
ABO/RH(D): O POS
Antibody Screen: NEGATIVE

## 2010-12-08 LAB — ETHANOL: Alcohol, Ethyl (B): 231 — ABNORMAL HIGH

## 2010-12-08 LAB — ABO/RH: ABO/RH(D): O POS

## 2010-12-08 LAB — HCG, SERUM, QUALITATIVE: Preg, Serum: NEGATIVE

## 2010-12-12 LAB — POCT PREGNANCY, URINE
Operator id: 297281
Preg Test, Ur: NEGATIVE

## 2010-12-19 LAB — POCT URINALYSIS DIP (DEVICE)
Bilirubin Urine: NEGATIVE
Glucose, UA: NEGATIVE
Ketones, ur: NEGATIVE
Nitrite: NEGATIVE
Operator id: 247071
Protein, ur: NEGATIVE
Specific Gravity, Urine: 1.015
Urobilinogen, UA: 0.2
pH: 6.5

## 2010-12-19 LAB — CBC
HCT: 41.4
Hemoglobin: 14
MCHC: 33.7
MCV: 95.3
Platelets: 270
RBC: 4.34
RDW: 13.8
WBC: 10.8 — ABNORMAL HIGH

## 2010-12-19 LAB — DIFFERENTIAL
Basophils Absolute: 0.1
Basophils Relative: 1
Eosinophils Absolute: 0.1
Eosinophils Relative: 1
Lymphocytes Relative: 23
Lymphs Abs: 2.5
Monocytes Absolute: 0.4
Monocytes Relative: 4
Neutro Abs: 7.7
Neutrophils Relative %: 71

## 2010-12-19 LAB — COMPREHENSIVE METABOLIC PANEL
ALT: 11
AST: 37
Albumin: 4
Alkaline Phosphatase: 62
BUN: 16
CO2: 18 — ABNORMAL LOW
Calcium: 9.2
Chloride: 109
Creatinine, Ser: 0.66
GFR calc Af Amer: >60
GFR calc non Af Amer: >60
Glucose, Bld: 104 — ABNORMAL HIGH
Potassium: 4.9
Sodium: 139
Total Bilirubin: 1.1
Total Protein: 7.1

## 2010-12-19 LAB — URINE MICROSCOPIC-ADD ON

## 2010-12-19 LAB — LIPASE, BLOOD: Lipase: 17

## 2010-12-19 LAB — URINALYSIS, ROUTINE W REFLEX MICROSCOPIC
Bilirubin Urine: NEGATIVE
Glucose, UA: NEGATIVE
Ketones, ur: NEGATIVE
Nitrite: NEGATIVE
Protein, ur: NEGATIVE
Specific Gravity, Urine: 1.019
Urobilinogen, UA: 0.2
pH: 6

## 2010-12-28 LAB — CBC
HCT: 38.2
Hemoglobin: 13.1
MCHC: 34.3
MCV: 96
Platelets: 270
RBC: 3.98
RDW: 12.9
WBC: 10

## 2010-12-28 LAB — DIFFERENTIAL
Basophils Absolute: 0.1
Basophils Relative: 1
Eosinophils Absolute: 0.5
Eosinophils Relative: 5
Lymphocytes Relative: 28
Lymphs Abs: 2.8
Monocytes Absolute: 0.5
Monocytes Relative: 5
Neutro Abs: 6.2
Neutrophils Relative %: 62

## 2011-02-23 ENCOUNTER — Other Ambulatory Visit: Payer: Self-pay | Admitting: Family Medicine

## 2011-04-17 ENCOUNTER — Ambulatory Visit: Payer: Self-pay | Admitting: Physical Therapy

## 2011-04-25 ENCOUNTER — Ambulatory Visit: Payer: Self-pay | Admitting: Rehabilitation

## 2011-05-03 ENCOUNTER — Ambulatory Visit: Payer: Medicare Other | Attending: Family Medicine | Admitting: Physical Therapy

## 2011-11-13 ENCOUNTER — Inpatient Hospital Stay (HOSPITAL_COMMUNITY)
Admission: EM | Admit: 2011-11-13 | Discharge: 2011-11-15 | DRG: 897 | Disposition: A | Discharge status: Home / Self Care | Payer: Medicare Other | Attending: Internal Medicine | Admitting: Internal Medicine

## 2011-11-13 ENCOUNTER — Encounter (HOSPITAL_COMMUNITY): Payer: Self-pay | Admitting: *Deleted

## 2011-11-13 DIAGNOSIS — D696 Thrombocytopenia, unspecified: Secondary | ICD-10-CM | POA: Diagnosis present

## 2011-11-13 DIAGNOSIS — F10939 Alcohol use, unspecified with withdrawal, unspecified: Principal | ICD-10-CM | POA: Diagnosis present

## 2011-11-13 DIAGNOSIS — F10239 Alcohol dependence with withdrawal, unspecified: Principal | ICD-10-CM | POA: Diagnosis present

## 2011-11-13 DIAGNOSIS — Z720 Tobacco use: Secondary | ICD-10-CM

## 2011-11-13 DIAGNOSIS — F3181 Bipolar II disorder: Secondary | ICD-10-CM

## 2011-11-13 DIAGNOSIS — M109 Gout, unspecified: Secondary | ICD-10-CM

## 2011-11-13 DIAGNOSIS — IMO0001 Reserved for inherently not codable concepts without codable children: Secondary | ICD-10-CM | POA: Diagnosis present

## 2011-11-13 DIAGNOSIS — F411 Generalized anxiety disorder: Secondary | ICD-10-CM | POA: Diagnosis present

## 2011-11-13 DIAGNOSIS — F17200 Nicotine dependence, unspecified, uncomplicated: Secondary | ICD-10-CM | POA: Diagnosis present

## 2011-11-13 DIAGNOSIS — J449 Chronic obstructive pulmonary disease, unspecified: Secondary | ICD-10-CM | POA: Diagnosis present

## 2011-11-13 DIAGNOSIS — F3189 Other bipolar disorder: Secondary | ICD-10-CM | POA: Diagnosis present

## 2011-11-13 DIAGNOSIS — I10 Essential (primary) hypertension: Secondary | ICD-10-CM

## 2011-11-13 DIAGNOSIS — F1020 Alcohol dependence, uncomplicated: Secondary | ICD-10-CM | POA: Diagnosis present

## 2011-11-13 DIAGNOSIS — Z79899 Other long term (current) drug therapy: Secondary | ICD-10-CM

## 2011-11-13 DIAGNOSIS — E876 Hypokalemia: Secondary | ICD-10-CM | POA: Diagnosis not present

## 2011-11-13 DIAGNOSIS — F1410 Cocaine abuse, uncomplicated: Secondary | ICD-10-CM

## 2011-11-13 DIAGNOSIS — J4489 Other specified chronic obstructive pulmonary disease: Secondary | ICD-10-CM | POA: Diagnosis present

## 2011-11-13 HISTORY — DX: Esophageal varices without bleeding: I85.00

## 2011-11-13 HISTORY — DX: Anxiety disorder, unspecified: F41.9

## 2011-11-13 HISTORY — DX: Acute pancreatitis without necrosis or infection, unspecified: K85.90

## 2011-11-13 HISTORY — DX: Alcohol abuse, uncomplicated: F10.10

## 2011-11-13 HISTORY — DX: Unspecified asthma, uncomplicated: J45.909

## 2011-11-13 LAB — COMPREHENSIVE METABOLIC PANEL
ALT: 40 U/L — ABNORMAL HIGH (ref 0–35)
AST: 133 U/L — ABNORMAL HIGH (ref 0–37)
Albumin: 4.5 g/dL (ref 3.5–5.2)
Alkaline Phosphatase: 78 U/L (ref 39–117)
BUN: 9 mg/dL (ref 6–23)
CO2: 19 mEq/L (ref 19–32)
Calcium: 10.1 mg/dL (ref 8.4–10.5)
Chloride: 93 mEq/L — ABNORMAL LOW (ref 96–112)
Creatinine, Ser: 0.53 mg/dL (ref 0.50–1.10)
GFR calc Af Amer: >90 mL/min (ref >90)
GFR calc non Af Amer: >90 mL/min (ref >90)
Glucose, Bld: 111 mg/dL — ABNORMAL HIGH (ref 70–99)
Potassium: 3.9 mEq/L (ref 3.5–5.1)
Sodium: 132 mEq/L — ABNORMAL LOW (ref 135–145)
Total Bilirubin: 0.8 mg/dL (ref 0.3–1.2)
Total Protein: 8.3 g/dL (ref 6.0–8.3)

## 2011-11-13 LAB — CBC WITH DIFFERENTIAL/PLATELET
Basophils Absolute: 0 10*3/uL (ref 0.0–0.1)
Basophils Relative: 0 % (ref 0–1)
Eosinophils Absolute: 0 10*3/uL (ref 0.0–0.7)
Eosinophils Relative: 0 % (ref 0–5)
HCT: 45.9 % (ref 36.0–46.0)
Hemoglobin: 15.8 g/dL — ABNORMAL HIGH (ref 12.0–15.0)
Lymphocytes Relative: 18 % (ref 12–46)
Lymphs Abs: 1.9 10*3/uL (ref 0.7–4.0)
MCH: 33.9 pg (ref 26.0–34.0)
MCHC: 34.4 g/dL (ref 30.0–36.0)
MCV: 98.5 fL (ref 78.0–100.0)
Monocytes Absolute: 0.4 10*3/uL (ref 0.1–1.0)
Monocytes Relative: 4 % (ref 3–12)
Neutro Abs: 8.1 10*3/uL — ABNORMAL HIGH (ref 1.7–7.7)
Neutrophils Relative %: 77 % (ref 43–77)
Platelets: 149 10*3/uL — ABNORMAL LOW (ref 150–400)
RBC: 4.66 MIL/uL (ref 3.87–5.11)
RDW: 12.6 % (ref 11.5–15.5)
WBC: 10.4 10*3/uL (ref 4.0–10.5)

## 2011-11-13 LAB — ETHANOL: Alcohol, Ethyl (B): <11 mg/dL (ref 0–11)

## 2011-11-13 LAB — RAPID URINE DRUG SCREEN, HOSP PERFORMED
Amphetamines: NOT DETECTED
Barbiturates: NOT DETECTED
Benzodiazepines: NOT DETECTED
Cocaine: POSITIVE — AB
Opiates: NOT DETECTED
Tetrahydrocannabinol: NOT DETECTED

## 2011-11-13 LAB — POCT PREGNANCY, URINE: Preg Test, Ur: NEGATIVE

## 2011-11-13 MED ORDER — BUDESONIDE-FORMOTEROL FUMARATE 160-4.5 MCG/ACT IN AERO
2.0000 | INHALATION_SPRAY | Freq: Two times a day (BID) | RESPIRATORY_TRACT | Status: DC
  Administered 2011-11-14 – 2011-11-15 (×3): 2 via RESPIRATORY_TRACT
  Filled 2011-11-13: qty 6

## 2011-11-13 MED ORDER — LORAZEPAM 2 MG/ML IJ SOLN: 0.0000 mg | Freq: Two times a day (BID) | INTRAMUSCULAR | Status: DC

## 2011-11-13 MED ORDER — ADULT MULTIVITAMIN W/MINERALS CH
1.0000 | ORAL_TABLET | Freq: Every day | ORAL | Status: DC
  Administered 2011-11-14 – 2011-11-15 (×2): 1 via ORAL
  Filled 2011-11-13 (×2): qty 1

## 2011-11-13 MED ORDER — SODIUM CHLORIDE 0.9 % IV SOLN
INTRAVENOUS | Status: DC
  Administered 2011-11-13: via INTRAVENOUS
  Administered 2011-11-14 (×2): 1000 mL via INTRAVENOUS
  Administered 2011-11-15: 05:00:00 via INTRAVENOUS

## 2011-11-13 MED ORDER — ACETAMINOPHEN 650 MG RE SUPP: 650.0000 mg | Freq: Four times a day (QID) | RECTAL | Status: DC | PRN

## 2011-11-13 MED ORDER — VITAMIN B-1 100 MG PO TABS
100.0000 mg | ORAL_TABLET | Freq: Every day | ORAL | Status: DC
  Administered 2011-11-14 – 2011-11-15 (×2): 100 mg via ORAL
  Filled 2011-11-13 (×2): qty 1

## 2011-11-13 MED ORDER — THIAMINE HCL 100 MG/ML IJ SOLN
100.0000 mg | Freq: Every day | INTRAMUSCULAR | Status: DC
  Filled 2011-11-13 (×2): qty 1

## 2011-11-13 MED ORDER — ALUM & MAG HYDROXIDE-SIMETH 200-200-20 MG/5ML PO SUSP: 30.0000 mL | Freq: Four times a day (QID) | ORAL | Status: DC | PRN

## 2011-11-13 MED ORDER — LORAZEPAM 1 MG PO TABS: 1.0000 mg | ORAL_TABLET | Freq: Four times a day (QID) | ORAL | Status: DC | PRN

## 2011-11-13 MED ORDER — SODIUM CHLORIDE 0.9 % IV SOLN: INTRAVENOUS | Status: AC

## 2011-11-13 MED ORDER — ACETAMINOPHEN 325 MG PO TABS: 650.0000 mg | ORAL_TABLET | Freq: Four times a day (QID) | ORAL | Status: DC | PRN

## 2011-11-13 MED ORDER — NICOTINE 14 MG/24HR TD PT24
14.0000 mg | MEDICATED_PATCH | Freq: Every day | TRANSDERMAL | Status: DC
  Administered 2011-11-14 – 2011-11-15 (×2): 14 mg via TRANSDERMAL
  Filled 2011-11-13 (×2): qty 1

## 2011-11-13 MED ORDER — ONDANSETRON HCL 4 MG PO TABS: 4.0000 mg | ORAL_TABLET | Freq: Four times a day (QID) | ORAL | Status: DC | PRN

## 2011-11-13 MED ORDER — PREGABALIN 50 MG PO CAPS
150.0000 mg | ORAL_CAPSULE | Freq: Two times a day (BID) | ORAL | Status: DC
  Administered 2011-11-14 – 2011-11-15 (×3): 150 mg via ORAL
  Filled 2011-11-13 (×3): qty 3

## 2011-11-13 MED ORDER — FOLIC ACID 1 MG PO TABS
1.0000 mg | ORAL_TABLET | Freq: Every day | ORAL | Status: DC
  Administered 2011-11-14 – 2011-11-15 (×2): 1 mg via ORAL
  Filled 2011-11-13 (×2): qty 1

## 2011-11-13 MED ORDER — LORAZEPAM 2 MG/ML IJ SOLN
1.0000 mg | Freq: Four times a day (QID) | INTRAMUSCULAR | Status: DC | PRN
  Administered 2011-11-14 (×2): 1 mg via INTRAVENOUS
  Filled 2011-11-13 (×2): qty 1

## 2011-11-13 MED ORDER — ENOXAPARIN SODIUM 40 MG/0.4ML ~~LOC~~ SOLN
40.0000 mg | Freq: Every day | SUBCUTANEOUS | Status: DC
  Administered 2011-11-14 (×2): 40 mg via SUBCUTANEOUS
  Filled 2011-11-13 (×3): qty 0.4

## 2011-11-13 MED ORDER — LORAZEPAM 2 MG/ML IJ SOLN
0.0000 mg | Freq: Four times a day (QID) | INTRAMUSCULAR | Status: DC
  Administered 2011-11-13: 2 mg via INTRAVENOUS
  Administered 2011-11-14 – 2011-11-15 (×2): 1 mg via INTRAVENOUS
  Filled 2011-11-13 (×3): qty 1

## 2011-11-13 MED ORDER — ONDANSETRON HCL 4 MG/2ML IJ SOLN: 4.0000 mg | Freq: Four times a day (QID) | INTRAMUSCULAR | Status: DC | PRN

## 2011-11-13 MED ORDER — LORAZEPAM 2 MG/ML IJ SOLN
2.0000 mg | Freq: Once | INTRAMUSCULAR | Status: AC
  Administered 2011-11-13: 2 mg via INTRAVENOUS
  Filled 2011-11-13: qty 1

## 2011-11-13 MED ORDER — SODIUM CHLORIDE 0.9 % IV BOLUS (SEPSIS)
1000.0000 mL | Freq: Once | INTRAVENOUS | Status: AC
  Administered 2011-11-13: 1000 mL via INTRAVENOUS

## 2011-11-13 NOTE — ED Provider Notes (Signed)
History     CSN: [id number]  Arrival date & time 11/13/11  1815   First MD Initiated Contact with Patient 11/13/11 2017      Chief Complaint  Patient presents with  . Tachycardia    (Consider location/radiation/quality/duration/timing/severity/associated sxs/prior treatment) The history is provided by the patient.   Patient here complaining of tremors and body shakes. States that she only drinks a half gallon of wine a day and has recently cut back. She also has a smoking crack 2 days ago and using Klonopin. She is a heavy abuser of alcohol. Denies any chest pain shortness of breath. Denies any other drug ingestions today. EMS was called and she was given IV saline and transported here Past Medical History  Diagnosis Date  . Anxiety   . Asthma   . Esophageal varices   . Pancreatitis   . Alcohol abuse     Past Surgical History  Procedure Date  . Hernia repair     No family history on file.  History  Substance Use Topics  . Smoking status: Current Everyday Smoker  . Smokeless tobacco: Not on file  . Alcohol Use: Yes     "half gallon of liquor a day"    OB History    Grav Para Term Preterm Abortions TAB SAB Ect Mult Living                  Review of Systems  All other systems reviewed and are negative.    Allergies  Erythromycin and Sulfa antibiotics  Home Medications   Current Outpatient Rx  Name Route Sig Dispense Refill  . ALPRAZOLAM 0.25 MG PO TABS Oral Take 0.25 mg by mouth 3 (three) times daily as needed. anxiety    . BUDESONIDE-FORMOTEROL FUMARATE 160-4.5 MCG/ACT IN AERO Inhalation Inhale 2 puffs into the lungs 2 (two) times daily.    Marland Kitchen CLONAZEPAM 1 MG PO TABS Oral Take 1 mg by mouth 3 (three) times daily.    Marland Kitchen PREGABALIN 150 MG PO CAPS Oral Take 150 mg by mouth 2 (two) times daily.      BP 120/92  Pulse 114  Temp 98.2 F (36.8 C) (Oral)  Resp 18  SpO2 99%  Physical Exam  Nursing note and vitals reviewed. Constitutional: She is oriented  to person, place, and time. She appears well-developed and well-nourished.  Non-toxic appearance. No distress.  HENT:  Head: Normocephalic and atraumatic.  Eyes: Conjunctivae, EOM and lids are normal. Pupils are equal, round, and reactive to light.  Neck: Normal range of motion. Neck supple. No tracheal deviation present. No mass present.  Cardiovascular: Normal rate, regular rhythm and normal heart sounds.  Exam reveals no gallop.   No murmur heard. Pulmonary/Chest: Effort normal and breath sounds normal. No stridor. No respiratory distress. She has no decreased breath sounds. She has no wheezes. She has no rhonchi. She has no rales.  Abdominal: Soft. Normal appearance and bowel sounds are normal. She exhibits no distension. There is no tenderness. There is no rebound and no CVA tenderness.  Musculoskeletal: Normal range of motion. She exhibits no edema and no tenderness.  Neurological: She is alert and oriented to person, place, and time. She has normal strength. She displays tremor. No cranial nerve deficit or sensory deficit. GCS eye subscore is 4. GCS verbal subscore is 5. GCS motor subscore is 6.  Skin: Skin is warm and dry. No abrasion and no rash noted.  Psychiatric: Her speech is normal. Her mood appears anxious.  She is agitated.    ED Course  Procedures (including critical care time)   Labs Reviewed  ETHANOL  URINE RAPID DRUG SCREEN (HOSP PERFORMED)  CBC WITH DIFFERENTIAL  COMPREHENSIVE METABOLIC PANEL   No results found.   No diagnosis found.    MDM  Patient given IV fluids and Ativan. Patient is resting comfortably and was reassessed and noted to have increased heart rate when awake. Suspect the patient is going to alcohol withdrawal because she  recently cut back her daily usage. Patient will be admitted by hospitalist for observation        Toy Baker, MD 11/13/11 2223

## 2011-11-13 NOTE — ED Notes (Addendum)
Pt c/o "not being able to cough or sneeze" and sts this made her very anxious and she could not stop "flailing around." During assessment, pt started to cough, like she was trying to clear her throat, but never completed cough. Sts this is "uncontrollable" however was able to stop and talk to RN when pt was asked questions. Pt reports drinking one half glass of wine today and taking total of (4) 1mg  klonopins throughout day. Denies other drug use. Hx alcohol abuse, cocaine abuse. Last used cocaine 2 days ago. Denies any chest pain, sob, diaphoresis. Pt was found on couch at home by a friend who reports pt was very restless but less responsive than normal and c/o feeling very hot. Pt has no symptoms currently.

## 2011-11-13 NOTE — H&P (Signed)
PCP:   None   Chief Complaint:  Tremors   HPI: This is a 36 year old female alcoholic. She states she drinks approximately half gallon of wine daily and any hard liquor she can get her hands on. She's decided to wean herself down and over the course of last few days is gone on to one glass of wine daily. Today she started getting frequent tremors and jerks. She came to the ER. She has had no seizures nor any history of seizures. Patient is alert and oriented. In the ER the patient received benzodiazepine but whenever she stands she becomes very tachycardic and occasionally tremulous, the hospitalist service was requested to admit.  Review of Systems:  The patient denies anorexia, fever, weight loss,, vision loss, decreased hearing, hoarseness, chest pain, syncope, dyspnea on exertion, peripheral edema, balance deficits, hemoptysis, abdominal pain, melena, hematochezia, severe indigestion/heartburn, hematuria, incontinence, genital sores, muscle weakness, suspicious skin lesions, transient blindness, difficulty walking, depression, unusual weight change, abnormal bleeding, enlarged lymph nodes, angioedema, and breast masses.  Past Medical History: Past Medical History  Diagnosis Date  . Anxiety   . Asthma   . Esophageal varices   . Pancreatitis   . Alcohol abuse    Past Surgical History  Procedure Date  . Hernia repair     Medications: Prior to Admission medications   Medication Sig Start Date End Date Taking? Authorizing Provider  ALPRAZolam (XANAX) 0.25 MG tablet Take 0.25 mg by mouth 3 (three) times daily as needed. anxiety   Yes Historical Provider, MD  budesonide-formoterol (SYMBICORT) 160-4.5 MCG/ACT inhaler Inhale 2 puffs into the lungs 2 (two) times daily.   Yes Historical Provider, MD  clonazePAM (KLONOPIN) 1 MG tablet Take 1 mg by mouth 3 (three) times daily.   Yes Historical Provider, MD  pregabalin (LYRICA) 150 MG capsule Take 150 mg by mouth 2 (two) times daily.   Yes  Historical Provider, MD    Allergies:   Allergies  Allergen Reactions  . Erythromycin   . Sulfa Antibiotics     Social History:  reports that she has been smoking.  She does not have any smokeless tobacco history on file. She reports that she drinks alcohol. She reports that she uses illicit drugs (Cocaine).  Family History: No family history on file.  Physical Exam: Filed Vitals:   11/13/11 1838 11/13/11 1900 11/13/11 2057 11/13/11 2255  BP: 120/92  124/78 123/76  Pulse: 127 114 113   Temp: 98.2 F (36.8 C)     TempSrc: Oral     Resp: 24 18 15 18   SpO2: 100% 99% 99%     General: Sleepy but Alert and oriented times three, well developed and nourished, no acute distress Eyes: PERRLA, pink conjunctiva, no scleral icterus ENT: Dry oral mucosa, neck supple, no thyromegaly Lungs: clear to ascultation, no wheeze, no crackles, no use of accessory muscles Cardiovascular: regular rate and rhythm, no regurgitation, no gallops, no murmurs. No carotid bruits, no JVD Abdomen: soft, positive BS, non-tender, distended, no organomegaly, not an acute abdomen GU: not examined Neuro: CN II - XII grossly intact, sensation intact Musculoskeletal: strength 5/5 all extremities, no clubbing, cyanosis or edema Skin: no rash, no subcutaneous crepitation, no decubitus   Labs on Admission:   [facility] 11/13/11 2050  NA 132*  K 3.9  CL 93*  CO2 19  GLUCOSE 111*  BUN 9  CREATININE 0.53  CALCIUM 10.1  MG --  PHOS --    Basename 11/13/11 2050  AST 133*  ALT 40*  ALKPHOS 78  BILITOT 0.8  PROT 8.3  ALBUMIN 4.5   No results found for this basename: LIPASE:2,AMYLASE:2 in the last 72 hours  Basename 11/13/11 2050  WBC 10.4  NEUTROABS 8.1*  HGB 15.8*  HCT 45.9  MCV 98.5  PLT 149*   No results found for this basename: CKTOTAL:3,CKMB:3,CKMBINDEX:3,TROPONINI:3 in the last 72 hours No components found with this basename: POCBNP:3 No results found for this basename: DDIMER:2 in the  last 72 hours No results found for this basename: HGBA1C:2 in the last 72 hours No results found for this basename: CHOL:2,HDL:2,LDLCALC:2,TRIG:2,CHOLHDL:2,LDLDIRECT:2 in the last 72 hours No results found for this basename: TSH,T4TOTAL,FREET3,T3FREE,THYROIDAB in the last 72 hours No results found for this basename: VITAMINB12:2,FOLATE:2,FERRITIN:2,TIBC:2,IRON:2,RETICCTPCT:2 in the last 72 hours  Micro Results: No results found for this or any previous visit (from the past 240 hour(s)).   Radiological Exams on Admission: No results found.  Assessment/Plan Present on Admission:  .Alcohol withdrawal Admit to telemetry Concern for DTs CIWA protocol ordered  Cocaine abuse  Tobacco abuse Nicotine patch ordered Bipolar disorder Gout Resume home medications   DVT prophylaxis Full code    Drezden Seitzinger 11/13/2011, 11:25 PM

## 2011-11-13 NOTE — ED Notes (Signed)
Pt in by ems, ems reports friend found pt laying on her couch, moaning, aloc. Pt c/o being very hot and was very fidgety. Admits to smoking crack 2 days ago. Sts today she has had (4) 1mg  klonopin, and half a glass of wine. Tachycardic for ems, initial HR 130, on arrival to dept 124. Pt has psych Hx and substance abuse Hx, appears to be a cutter. Ems IV 20g saline lock L AC.

## 2011-11-14 DIAGNOSIS — E876 Hypokalemia: Secondary | ICD-10-CM | POA: Diagnosis not present

## 2011-11-14 DIAGNOSIS — F17200 Nicotine dependence, unspecified, uncomplicated: Secondary | ICD-10-CM

## 2011-11-14 DIAGNOSIS — F3189 Other bipolar disorder: Secondary | ICD-10-CM

## 2011-11-14 DIAGNOSIS — D696 Thrombocytopenia, unspecified: Secondary | ICD-10-CM | POA: Diagnosis present

## 2011-11-14 DIAGNOSIS — F1410 Cocaine abuse, uncomplicated: Secondary | ICD-10-CM

## 2011-11-14 LAB — COMPREHENSIVE METABOLIC PANEL
ALT: 52 U/L — ABNORMAL HIGH (ref 0–35)
AST: 223 U/L — ABNORMAL HIGH (ref 0–37)
Albumin: 3.6 g/dL (ref 3.5–5.2)
Alkaline Phosphatase: 64 U/L (ref 39–117)
BUN: 8 mg/dL (ref 6–23)
CO2: 20 mEq/L (ref 19–32)
Calcium: 8.9 mg/dL (ref 8.4–10.5)
Chloride: 99 mEq/L (ref 96–112)
Creatinine, Ser: 0.5 mg/dL (ref 0.50–1.10)
GFR calc Af Amer: >90 mL/min (ref >90)
GFR calc non Af Amer: >90 mL/min (ref >90)
Glucose, Bld: 107 mg/dL — ABNORMAL HIGH (ref 70–99)
Potassium: 3.3 mEq/L — ABNORMAL LOW (ref 3.5–5.1)
Sodium: 135 mEq/L (ref 135–145)
Total Bilirubin: 0.8 mg/dL (ref 0.3–1.2)
Total Protein: 6.9 g/dL (ref 6.0–8.3)

## 2011-11-14 LAB — CBC
HCT: 40.6 % (ref 36.0–46.0)
Hemoglobin: 13.8 g/dL (ref 12.0–15.0)
MCH: 33.7 pg (ref 26.0–34.0)
MCHC: 34 g/dL (ref 30.0–36.0)
MCV: 99 fL (ref 78.0–100.0)
Platelets: 142 10*3/uL — ABNORMAL LOW (ref 150–400)
RBC: 4.1 MIL/uL (ref 3.87–5.11)
RDW: 12.6 % (ref 11.5–15.5)
WBC: 6.3 10*3/uL (ref 4.0–10.5)

## 2011-11-14 LAB — MAGNESIUM: Magnesium: 1.5 mg/dL (ref 1.5–2.5)

## 2011-11-14 LAB — PROTIME-INR
INR: 0.96 (ref 0.00–1.49)
Prothrombin Time: 13 seconds (ref 11.6–15.2)

## 2011-11-14 LAB — PHOSPHORUS: Phosphorus: 3.1 mg/dL (ref 2.3–4.6)

## 2011-11-14 MED ORDER — POTASSIUM CHLORIDE CRYS ER 20 MEQ PO TBCR
40.0000 meq | EXTENDED_RELEASE_TABLET | ORAL | Status: AC
  Administered 2011-11-14 (×3): 40 meq via ORAL
  Filled 2011-11-14 (×3): qty 2

## 2011-11-14 NOTE — Progress Notes (Signed)
Pt's home medications sent to pharmacy

## 2011-11-14 NOTE — Progress Notes (Signed)
[facility] PROGRESS NOTE  [name] ZOX:[id number] DOB: [birth year] DOA: 11/13/2011 PCP: No primary provider on file.  Assessment/Plan: 1-Alcohol withdrawal: will ask SW to provide counseling and also to provide outpatient resources to help with quitting process. Continue CIWA protocol for now and replete electrolytes. Continue thiamine and folic acid. Patient is better.  2-Bipolar 2 disorder:stable. Will continue klonopin and PRN xanax at discharge. Currently receiving IV ativan as part of her withdrawal therapy.   3-Tobacco abuse: continue nicotine patch; counseling provided.  4-Cocaine abuse:counseling provided; Sw to provide outpatient resources to assist her quitting. Patient decline inpatient detox services.   5-Fibromyalgia: continue lyrica  6-asthma/COPD: stable; no wheezing. Continue Symbicort.  Code Status: Full Family Communication: No family at bedside Disposition Plan: home when medically stable   Brief narrative: 36 year old female alcoholic. She states she drinks approximately half gallon of wine daily and any hard liquor she can get her hands on. She has decided to wean herself down and over the course of last few days is gone on to one glass of wine daily. Today she started getting frequent tremors and jerks; admitted for concerns of DT's   Consultants:  SW  Antibiotics:  None  HPI/Subjective: Feeling better; no CP, no SOB; denies N/V or fever. Significant decrease in her tremors.  Objective: Filed Vitals:   11/13/11 2328 11/14/11 0556 11/14/11 0935 11/14/11 1300  BP: 135/84 140/96  148/98  Pulse: 96 108  98  Temp: 97.7 F (36.5 C) 98 F (36.7 C)  98.4 F (36.9 C)  TempSrc: Oral Oral  Oral  Resp: 18 19  20   Height: 5\' 8"  (1.727 m)     Weight: 77.157 kg (170 lb 1.6 oz)     SpO2: 100% 96% 97% 98%    Intake/Output Summary (Last 24 hours) at 11/14/11 1518 Last data filed at 11/14/11 1444  Gross per 24 hour  Intake    240 ml  Output       4 ml  Net    236 ml   Filed Weights   11/13/11 2328  Weight: 77.157 kg (170 lb 1.6 oz)    Exam:   General:  NAD; reports no tremors or shaking. Denies hallucinations or any other acute complaints  Cardiovascular: mild tachycardia, no rubs or gallops  Respiratory: CTA bilaterally  Abdomen: soft, NT, ND; positive BS  Extremities: no edema, no cyanosis or clubbing  Neuro: AAOX3; no focal deficit; CN intact  Data Reviewed: Basic Metabolic Panel:  Lab 11/14/11 8119 11/13/11 2050  NA 135 132*  K 3.3* 3.9  CL 99 93*  CO2 20 19  GLUCOSE 107* 111*  BUN 8 9  CREATININE 0.50 0.53  CALCIUM 8.9 10.1  MG -- --  PHOS -- --   Liver Function Tests:  Lab 11/14/11 0500 11/13/11 2050  AST 223* 133*  ALT 52* 40*  ALKPHOS 64 78  BILITOT 0.8 0.8  PROT 6.9 8.3  ALBUMIN 3.6 4.5   CBC:  Lab 11/14/11 0500 11/13/11 2050  WBC 6.3 10.4  NEUTROABS -- 8.1*  HGB 13.8 15.8*  HCT 40.6 45.9  MCV 99.0 98.5  PLT 142* 149*     Studies: No results found.  Scheduled Meds:   . sodium chloride   Intravenous STAT  . budesonide-formoterol  2 puff Inhalation BID  . enoxaparin (LOVENOX) injection  40 mg Subcutaneous QHS  . folic acid  1 mg Oral Daily  . LORazepam  0-4 mg Intravenous Q6H   Followed by  .  LORazepam  0-4 mg Intravenous Q12H  . LORazepam  2 mg Intravenous Once  . multivitamin with minerals  1 tablet Oral Daily  . nicotine  14 mg Transdermal Daily  . potassium chloride  40 mEq Oral Q4H  . pregabalin  150 mg Oral BID  . sodium chloride  1,000 mL Intravenous Once  . thiamine  100 mg Oral Daily   Or  . thiamine  100 mg Intravenous Daily   Continuous Infusions:   . sodium chloride 1,000 mL (11/14/11 0730)    Time spent: > 30 minutes    Jaskiran Pata  [facility] Pager [phone]. If 8PM-8AM, please contact night-coverage at [email or web address], password [facility] 11/14/2011, 3:18 PM  LOS: 1 day

## 2011-11-15 DIAGNOSIS — I10 Essential (primary) hypertension: Secondary | ICD-10-CM

## 2011-11-15 DIAGNOSIS — E876 Hypokalemia: Secondary | ICD-10-CM

## 2011-11-15 DIAGNOSIS — D696 Thrombocytopenia, unspecified: Secondary | ICD-10-CM

## 2011-11-15 LAB — BASIC METABOLIC PANEL
BUN: 11 mg/dL (ref 6–23)
CO2: 22 mEq/L (ref 19–32)
Calcium: 8.9 mg/dL (ref 8.4–10.5)
Chloride: 107 mEq/L (ref 96–112)
Creatinine, Ser: 0.66 mg/dL (ref 0.50–1.10)
GFR calc Af Amer: >90 mL/min (ref >90)
GFR calc non Af Amer: >90 mL/min (ref >90)
Glucose, Bld: 107 mg/dL — ABNORMAL HIGH (ref 70–99)
Potassium: 4.7 mEq/L (ref 3.5–5.1)
Sodium: 138 mEq/L (ref 135–145)

## 2011-11-15 MED ORDER — THIAMINE HCL 100 MG PO TABS: 100.0000 mg | ORAL_TABLET | Freq: Every day | ORAL | Status: DC

## 2011-11-15 MED ORDER — FOLIC ACID 1 MG PO TABS: 1.0000 mg | ORAL_TABLET | Freq: Every day | ORAL | Status: DC

## 2011-11-15 MED ORDER — OMEPRAZOLE 40 MG PO CPDR: 40.0000 mg | DELAYED_RELEASE_CAPSULE | Freq: Every day | ORAL | Status: DC

## 2011-11-15 MED ORDER — ADULT MULTIVITAMIN W/MINERALS CH: 1.0000 | ORAL_TABLET | Freq: Every day | ORAL | Status: DC

## 2011-11-15 NOTE — Discharge Summary (Signed)
Physician Discharge Summary  [name] YNW:[id number] DOB: 1975-12-09 DOA: 11/13/2011  PCP: No primary provider on file.  Admit date: 11/13/2011 Discharge date: 11/15/2011  Recommendations for Outpatient Follow-up:  1. PCP in 2 weeks to restablish care and to follow her BP and thrombocytopenia. 2. Follow up with Dr. Betti Cruz (Psychiatry service) on 11/26/11 for further evaluation and treatment of her psych problems.  Discharge Diagnoses:  Active Problems:  Alcohol withdrawal  Bipolar 2 disorder  Tobacco abuse  Cocaine abuse  Gout  Hypokalemia  Thrombocytopenia   Discharge Condition: stable and improved. No SI, no hallucinations and no active withdrawal symptoms.  Diet recommendation: heart healthy  Filed Weights   11/13/11 2328  Weight: 77.157 kg (170 lb 1.6 oz)    History of present illness:  36 year old female alcoholic. She states she drinks approximately half gallon of wine daily and any hard liquor she can get her hands on. She has decided to wean herself down and over the course of last few days is gone on to one glass of wine daily. Today she started getting frequent tremors and jerks; admitted for concerns of DT's    Hospital Course:  1-Alcohol withdrawal: No further shaking, no tremors. Patient follow CIWA protocol and schedule ativan since admission; now will continue PRN klonopin and xanax as prescribed prior to admission. Patient encourage to stop drinking, to use folic acid, and thiamine and received information of outpatient resources for substance abuse problems.  2-Bipolar 2 disorder: stable. Will continue klonopin and PRN xanax at discharge. Will follow with Dr. Betti Cruz for further evaluation and treatment of her condition.  3-Tobacco abuse: counseling provided; patient encourage to use OTC nicotine patch since she felt helps her while in the hospital.   4-Cocaine abuse:counseling provided; Sw to provide outpatient resources to assist her quitting. Patient decline  inpatient detox services.   5-Fibromyalgia: continue lyrica   6-asthma/COPD: stable; no wheezing. Continue Symbicort.  7-GERD: most likely due to alcohol. Started on prilosec  8-Thrombocytopenia: due to alcohol; stable and without signs of bleeding during admission. To be follow by PCP as an outpatient.  9-Mild HTN: probably associated with alcohol and withdrawal; no meds started at this point. Patient instructed to follow a heart healthy diet, to stop drinking and to follow with PCP in order to have BP monitor and to start antihypertensive regimen if needed.   Consultations:  SW  Discharge Exam: Filed Vitals:   11/15/11 1000  BP:   Pulse: 122  Temp:   Resp:    Filed Vitals:   11/14/11 2122 11/15/11 0546 11/15/11 0808 11/15/11 1000  BP: 129/84 153/97    Pulse: 85 76  122  Temp: 98.4 F (36.9 C) 97.7 F (36.5 C)    TempSrc: Oral Oral    Resp: 18 19    Height:      Weight:      SpO2: 96% 99% 98%     General: NAD; denies CP, SOB, nausea, vomiting or any other complaints Cardiovascular: Mild tachycardia, no murmur, no rubs or gallops Respiratory: CTA Abdomen: mild epigastric discomfort (pretty much resolved since admission), no guarding, positive BS, no distension Neuro: non focal.  Discharge Instructions  Discharge Orders    Future Orders Please Complete By Expires   Diet - low sodium heart healthy      Discharge instructions      Comments:   -Follow appointments as instructed for outpatient psychiatry and also resources to help you with substance abuse -Stop drinking -take medications  as prescribed -Keep yourself well hydrated.     Medication List  As of 11/15/2011 11:56 AM   TAKE these medications         ALPRAZolam 0.25 MG tablet   Commonly known as: XANAX   Take 0.25 mg by mouth 3 (three) times daily as needed. anxiety      budesonide-formoterol 160-4.5 MCG/ACT inhaler   Commonly known as: SYMBICORT   Inhale 2 puffs into the lungs 2 (two) times  daily.      clonazePAM 1 MG tablet   Commonly known as: KLONOPIN   Take 1 mg by mouth 3 (three) times daily.      folic acid 1 MG tablet   Commonly known as: FOLVITE   Take 1 tablet (1 mg total) by mouth daily.      multivitamin with minerals Tabs   Take 1 tablet by mouth daily.      omeprazole 40 MG capsule   Commonly known as: PRILOSEC   Take 1 capsule (40 mg total) by mouth daily.      pregabalin 150 MG capsule   Commonly known as: LYRICA   Take 150 mg by mouth 2 (two) times daily.      thiamine 100 MG tablet   Take 1 tablet (100 mg total) by mouth daily.              The results of significant diagnostics from this hospitalization (including imaging, microbiology, ancillary and laboratory) are listed below for reference.     Labs: Basic Metabolic Panel:  Lab 11/15/11 1610 11/14/11 0500 11/13/11 2050  NA 138 135 132*  K 4.7 3.3* 3.9  CL 107 99 93*  CO2 22 20 19   GLUCOSE 107* 107* 111*  BUN 11 8 9   CREATININE 0.66 0.50 0.53  CALCIUM 8.9 8.9 10.1  MG -- 1.5 --  PHOS -- 3.1 --   Liver Function Tests:  Lab 11/14/11 0500 11/13/11 2050  AST 223* 133*  ALT 52* 40*  ALKPHOS 64 78  BILITOT 0.8 0.8  PROT 6.9 8.3  ALBUMIN 3.6 4.5   CBC:  Lab 11/14/11 0500 11/13/11 2050  WBC 6.3 10.4  NEUTROABS -- 8.1*  HGB 13.8 15.8*  HCT 40.6 45.9  MCV 99.0 98.5  PLT 142* 149*    Time coordinating discharge: > 30 minutes  Signed:  Linnet Bottari  [facility] 11/15/2011, 11:56 AM

## 2011-11-15 NOTE — Progress Notes (Signed)
Clinical Social Work Department BRIEF PSYCHOSOCIAL ASSESSMENT 11/15/2011  Patient:  [name], [name]     Account Number:  [id number]     Admit date:  11/13/2011  Clinical Social Worker:  Doroteo Glassman  Date/Time:  11/15/2011 10:41 AM  Referred by:  Physician  Date Referred:  11/15/2011 Referred for  Substance Abuse   Other Referral:   Interview type:  Patient Other interview type:    PSYCHOSOCIAL DATA Living Status:  FAMILY Admitted from facility:   Level of care:   Primary support name:  Selinda Michaels Primary support relationship to patient:  PARENT Degree of support available:   Unknown    CURRENT CONCERNS Current Concerns  Substance Abuse   Other Concerns:    SOCIAL WORK ASSESSMENT / PLAN Met with Pt to discuss d/c plans.    Pt reports that she began drinking at age 68 but that her drinking has gotten gradually worse over the years.  Pt's father died in 2023/04/16 and she states that she began drinking heavily upon his death.    Pt reports that she consumes approx 3 1/2 gallons of whiskey per week.  Pt states that she's received inpt tx at [facility] several times due to her drinking, SI, suicide attempts and self-injurious behaviors.    Pt is not wanting tx at this time.  She states that AA makes her want to "drink more", as she finds meetings to be depressing.    Pt is followed by Dr. Betti Cruz at [facility].  She states that she has an appt with him soon and gave CSW permission to obtain her appt information.  Pt reports that she's supposed to take Tegretal for her Bipolar D/O but that she's been non-compliant with her medication regimen.  She could provide no reason for this. Pt is also dx'd with PTSD and Borderline PD.    Pt reports that she used to see Will Crouse at [facility] [place] and states that she received benefit from his services.  Pt gave CSW permission to schedule an appt with this provider on her behalf.    Pt reports that she  hasn't worked since 2008.  She has recently been approved for disability and is still working out the kinks for payment, as she doesn't have a bank account.  Pt states that she lives with her grandmom and states, "She's helps me and I help her."    Contacted [facility] [place].  Learned that Pt hasn't been seen since Feb.  Thus, she will need to present for an Ax, as their policy such that a Pt needs a new Ax if they haven't been seen in 90 days.  [facility]'s Ax hrs are walk-in only: 8:30-12 and then 1-2:30.    CSW learned that Pt had an appt yesterday with Dr. Betti Cruz. Informed office staff that Pt was in [facility] at that time. Obtained another appt for Pt: September 9th at 1420.    Notified Pt of above appt information.    Provided Pt with SA tx resources.    CSW thanked Pt for her time.    No further needs identified, at this time.    CSW to sign off.   Assessment/plan status:  Referral to [facility] Other assessment/ plan:   Information/referral to community resources:   Appts made to outpt providers.    SA tx information given.    PATIENT'S/FAMILY'S RESPONSE TO PLAN OF CARE: Pt thanked CSW for time and  assistance.  CSW to sign off.  Providence Crosby, LCSWA Clinical Social Work [phone]

## 2011-11-15 NOTE — Progress Notes (Signed)
Pt d/c instructions and medications reviewed with pt. Pt verbalized understanding. No issues or concerns addressed. D/ced home at this time. Taken out of facility via w/c by staff.

## 2012-09-26 ENCOUNTER — Encounter (HOSPITAL_COMMUNITY): Payer: Self-pay | Admitting: *Deleted

## 2012-09-26 ENCOUNTER — Inpatient Hospital Stay (HOSPITAL_COMMUNITY)
Admission: EM | Admit: 2012-09-26 | Discharge: 2012-10-02 | DRG: 896 | Disposition: A | Discharge status: Skilled Nursing Facility | Payer: Medicare Other | Attending: Internal Medicine | Admitting: Internal Medicine

## 2012-09-26 DIAGNOSIS — E860 Dehydration: Secondary | ICD-10-CM

## 2012-09-26 DIAGNOSIS — E872 Acidosis, unspecified: Secondary | ICD-10-CM | POA: Diagnosis present

## 2012-09-26 DIAGNOSIS — A5901 Trichomonal vulvovaginitis: Secondary | ICD-10-CM | POA: Diagnosis present

## 2012-09-26 DIAGNOSIS — E871 Hypo-osmolality and hyponatremia: Secondary | ICD-10-CM | POA: Diagnosis present

## 2012-09-26 DIAGNOSIS — N390 Urinary tract infection, site not specified: Secondary | ICD-10-CM | POA: Diagnosis present

## 2012-09-26 DIAGNOSIS — F17200 Nicotine dependence, unspecified, uncomplicated: Secondary | ICD-10-CM | POA: Diagnosis present

## 2012-09-26 DIAGNOSIS — M109 Gout, unspecified: Secondary | ICD-10-CM | POA: Diagnosis present

## 2012-09-26 DIAGNOSIS — R634 Abnormal weight loss: Secondary | ICD-10-CM | POA: Diagnosis present

## 2012-09-26 DIAGNOSIS — D696 Thrombocytopenia, unspecified: Secondary | ICD-10-CM

## 2012-09-26 DIAGNOSIS — I8510 Secondary esophageal varices without bleeding: Secondary | ICD-10-CM | POA: Diagnosis present

## 2012-09-26 DIAGNOSIS — E876 Hypokalemia: Secondary | ICD-10-CM | POA: Diagnosis present

## 2012-09-26 DIAGNOSIS — F3189 Other bipolar disorder: Secondary | ICD-10-CM

## 2012-09-26 DIAGNOSIS — F419 Anxiety disorder, unspecified: Secondary | ICD-10-CM

## 2012-09-26 DIAGNOSIS — R5381 Other malaise: Secondary | ICD-10-CM | POA: Diagnosis present

## 2012-09-26 DIAGNOSIS — K7010 Alcoholic hepatitis without ascites: Secondary | ICD-10-CM | POA: Diagnosis present

## 2012-09-26 DIAGNOSIS — F10930 Alcohol use, unspecified with withdrawal, uncomplicated: Secondary | ICD-10-CM

## 2012-09-26 DIAGNOSIS — F3181 Bipolar II disorder: Secondary | ICD-10-CM | POA: Diagnosis present

## 2012-09-26 DIAGNOSIS — A599 Trichomoniasis, unspecified: Secondary | ICD-10-CM | POA: Diagnosis present

## 2012-09-26 DIAGNOSIS — F411 Generalized anxiety disorder: Secondary | ICD-10-CM | POA: Diagnosis present

## 2012-09-26 DIAGNOSIS — F1020 Alcohol dependence, uncomplicated: Principal | ICD-10-CM | POA: Diagnosis present

## 2012-09-26 DIAGNOSIS — F329 Major depressive disorder, single episode, unspecified: Secondary | ICD-10-CM

## 2012-09-26 DIAGNOSIS — F1410 Cocaine abuse, uncomplicated: Secondary | ICD-10-CM | POA: Diagnosis present

## 2012-09-26 DIAGNOSIS — F1010 Alcohol abuse, uncomplicated: Secondary | ICD-10-CM

## 2012-09-26 DIAGNOSIS — E43 Unspecified severe protein-calorie malnutrition: Secondary | ICD-10-CM | POA: Diagnosis present

## 2012-09-26 DIAGNOSIS — F10230 Alcohol dependence with withdrawal, uncomplicated: Secondary | ICD-10-CM

## 2012-09-26 DIAGNOSIS — F32A Depression, unspecified: Secondary | ICD-10-CM

## 2012-09-26 DIAGNOSIS — Z720 Tobacco use: Secondary | ICD-10-CM | POA: Diagnosis present

## 2012-09-26 HISTORY — DX: Gout, unspecified: M10.9

## 2012-09-26 HISTORY — DX: Major depressive disorder, single episode, unspecified: F32.9

## 2012-09-26 HISTORY — DX: Anxiety disorder, unspecified: F41.9

## 2012-09-26 HISTORY — DX: Depression, unspecified: F32.A

## 2012-09-26 HISTORY — DX: Bipolar disorder, unspecified: F31.9

## 2012-09-26 LAB — COMPREHENSIVE METABOLIC PANEL
ALT: 116 U/L — ABNORMAL HIGH (ref 0–35)
AST: 614 U/L — ABNORMAL HIGH (ref 0–37)
Albumin: 3.6 g/dL (ref 3.5–5.2)
Alkaline Phosphatase: 112 U/L (ref 39–117)
BUN: 9 mg/dL (ref 6–23)
CO2: 12 mEq/L — ABNORMAL LOW (ref 19–32)
Calcium: 9.3 mg/dL (ref 8.4–10.5)
Chloride: 94 mEq/L — ABNORMAL LOW (ref 96–112)
Creatinine, Ser: 0.52 mg/dL (ref 0.50–1.10)
GFR calc Af Amer: >90 mL/min (ref >90)
GFR calc non Af Amer: >90 mL/min (ref >90)
Glucose, Bld: 224 mg/dL — ABNORMAL HIGH (ref 70–99)
Potassium: 4.6 mEq/L (ref 3.5–5.1)
Sodium: 131 mEq/L — ABNORMAL LOW (ref 135–145)
Total Bilirubin: 2 mg/dL — ABNORMAL HIGH (ref 0.3–1.2)
Total Protein: 8.2 g/dL (ref 6.0–8.3)

## 2012-09-26 LAB — RAPID URINE DRUG SCREEN, HOSP PERFORMED
Amphetamines: NOT DETECTED
Barbiturates: NOT DETECTED
Benzodiazepines: NOT DETECTED
Cocaine: POSITIVE — AB
Opiates: NOT DETECTED
Tetrahydrocannabinol: NOT DETECTED

## 2012-09-26 LAB — CBC WITH DIFFERENTIAL/PLATELET
Basophils Absolute: 0 10*3/uL (ref 0.0–0.1)
Basophils Relative: 1 % (ref 0–1)
Eosinophils Absolute: 0.2 10*3/uL (ref 0.0–0.7)
Eosinophils Relative: 3 % (ref 0–5)
HCT: 41.6 % (ref 36.0–46.0)
Hemoglobin: 14.3 g/dL (ref 12.0–15.0)
Lymphocytes Relative: 33 % (ref 12–46)
Lymphs Abs: 1.9 10*3/uL (ref 0.7–4.0)
MCH: 33 pg (ref 26.0–34.0)
MCHC: 34.4 g/dL (ref 30.0–36.0)
MCV: 96.1 fL (ref 78.0–100.0)
Monocytes Absolute: 0.6 10*3/uL (ref 0.1–1.0)
Monocytes Relative: 11 % (ref 3–12)
Neutro Abs: 3 10*3/uL (ref 1.7–7.7)
Neutrophils Relative %: 52 % (ref 43–77)
Platelets: 115 10*3/uL — ABNORMAL LOW (ref 150–400)
RBC: 4.33 MIL/uL (ref 3.87–5.11)
RDW: 12.8 % (ref 11.5–15.5)
WBC: 5.7 10*3/uL (ref 4.0–10.5)

## 2012-09-26 LAB — URINALYSIS, ROUTINE W REFLEX MICROSCOPIC
Glucose, UA: 250 mg/dL — AB
Hgb urine dipstick: NEGATIVE
Ketones, ur: >80 mg/dL — AB
Nitrite: POSITIVE — AB
Protein, ur: 100 mg/dL — AB
Specific Gravity, Urine: 1.025 (ref 1.005–1.030)
Urobilinogen, UA: >8 mg/dL — ABNORMAL HIGH (ref 0.0–1.0)
pH: 6 (ref 5.0–8.0)

## 2012-09-26 LAB — URINE MICROSCOPIC-ADD ON

## 2012-09-26 LAB — CG4 I-STAT (LACTIC ACID): Lactic Acid, Venous: 2.64 mmol/L — ABNORMAL HIGH (ref 0.5–2.2)

## 2012-09-26 LAB — POCT PREGNANCY, URINE: Preg Test, Ur: NEGATIVE

## 2012-09-26 LAB — LIPASE, BLOOD: Lipase: 64 U/L — ABNORMAL HIGH (ref 11–59)

## 2012-09-26 LAB — OCCULT BLOOD, POC DEVICE: Fecal Occult Bld: NEGATIVE

## 2012-09-26 MED ORDER — ONDANSETRON HCL 4 MG/2ML IJ SOLN
4.0000 mg | Freq: Once | INTRAMUSCULAR | Status: AC
  Administered 2012-09-26: 4 mg via INTRAVENOUS
  Filled 2012-09-26 (×2): qty 2

## 2012-09-26 MED ORDER — FOLIC ACID 1 MG PO TABS
1.0000 mg | ORAL_TABLET | Freq: Once | ORAL | Status: AC
  Administered 2012-09-26: 1 mg via ORAL
  Filled 2012-09-26: qty 1

## 2012-09-26 MED ORDER — FOLIC ACID 1 MG PO TABS
1.0000 mg | ORAL_TABLET | Freq: Every day | ORAL | Status: DC
  Filled 2012-09-26: qty 1

## 2012-09-26 MED ORDER — ADULT MULTIVITAMIN W/MINERALS CH
1.0000 | ORAL_TABLET | Freq: Every day | ORAL | Status: DC
  Filled 2012-09-26: qty 1

## 2012-09-26 MED ORDER — METRONIDAZOLE IN NACL 5-0.79 MG/ML-% IV SOLN
500.0000 mg | Freq: Three times a day (TID) | INTRAVENOUS | Status: DC
  Administered 2012-09-26 – 2012-09-28 (×5): 500 mg via INTRAVENOUS
  Filled 2012-09-26 (×7): qty 100

## 2012-09-26 MED ORDER — SODIUM CHLORIDE 0.9 % IJ SOLN
3.0000 mL | Freq: Two times a day (BID) | INTRAMUSCULAR | Status: DC
  Administered 2012-09-28 – 2012-10-02 (×5): 3 mL via INTRAVENOUS

## 2012-09-26 MED ORDER — ALBUTEROL SULFATE HFA 108 (90 BASE) MCG/ACT IN AERS
2.0000 | INHALATION_SPRAY | Freq: Four times a day (QID) | RESPIRATORY_TRACT | Status: DC | PRN
  Filled 2012-09-26: qty 6.7

## 2012-09-26 MED ORDER — SODIUM CHLORIDE 0.9 % IV BOLUS (SEPSIS)
1000.0000 mL | Freq: Once | INTRAVENOUS | Status: AC
  Administered 2012-09-26: 1000 mL via INTRAVENOUS

## 2012-09-26 MED ORDER — LORAZEPAM 2 MG/ML IJ SOLN: 1.0000 mg | Freq: Four times a day (QID) | INTRAMUSCULAR | Status: DC | PRN

## 2012-09-26 MED ORDER — CEFTRIAXONE SODIUM 1 G IJ SOLR
1.0000 g | Freq: Once | INTRAMUSCULAR | Status: AC
  Administered 2012-09-26: 1 g via INTRAMUSCULAR
  Filled 2012-09-26 (×2): qty 10

## 2012-09-26 MED ORDER — HYDROMORPHONE HCL PF 1 MG/ML IJ SOLN
1.0000 mg | Freq: Once | INTRAMUSCULAR | Status: AC
  Administered 2012-09-26: 1 mg via INTRAVENOUS
  Filled 2012-09-26: qty 1

## 2012-09-26 MED ORDER — KCL IN DEXTROSE-NACL 20-5-0.9 MEQ/L-%-% IV SOLN
INTRAVENOUS | Status: DC
  Administered 2012-09-26 – 2012-09-30 (×6): via INTRAVENOUS
  Filled 2012-09-26 (×15): qty 1000

## 2012-09-26 MED ORDER — HYDROMORPHONE HCL PF 1 MG/ML IJ SOLN
1.0000 mg | INTRAMUSCULAR | Status: DC | PRN
  Administered 2012-09-26 – 2012-09-27 (×3): 1 mg via INTRAVENOUS
  Filled 2012-09-26 (×3): qty 1

## 2012-09-26 MED ORDER — LORAZEPAM 2 MG/ML IJ SOLN
0.0000 mg | Freq: Four times a day (QID) | INTRAMUSCULAR | Status: AC
  Administered 2012-09-26 – 2012-09-27 (×2): 2 mg via INTRAVENOUS
  Administered 2012-09-27: 1 mg via INTRAVENOUS
  Administered 2012-09-28 (×2): 2 mg via INTRAVENOUS
  Filled 2012-09-26 (×6): qty 1

## 2012-09-26 MED ORDER — THIAMINE HCL 100 MG/ML IJ SOLN
100.0000 mg | Freq: Every day | INTRAMUSCULAR | Status: DC
  Filled 2012-09-26: qty 1

## 2012-09-26 MED ORDER — ONDANSETRON HCL 4 MG/2ML IJ SOLN
4.0000 mg | Freq: Once | INTRAMUSCULAR | Status: AC
  Administered 2012-09-26: 4 mg via INTRAVENOUS
  Filled 2012-09-26: qty 2

## 2012-09-26 MED ORDER — LORAZEPAM 2 MG/ML IJ SOLN
0.0000 mg | Freq: Two times a day (BID) | INTRAMUSCULAR | Status: AC
  Administered 2012-09-28 – 2012-09-30 (×2): 2 mg via INTRAVENOUS
  Administered 2012-09-30: 1 mg via INTRAVENOUS
  Filled 2012-09-26 (×2): qty 1

## 2012-09-26 MED ORDER — LORAZEPAM 1 MG PO TABS: 1.0000 mg | ORAL_TABLET | Freq: Four times a day (QID) | ORAL | Status: DC | PRN

## 2012-09-26 MED ORDER — THIAMINE HCL 100 MG/ML IJ SOLN
100.0000 mg | Freq: Once | INTRAMUSCULAR | Status: AC
  Administered 2012-09-26: 100 mg via INTRAVENOUS
  Filled 2012-09-26 (×2): qty 2

## 2012-09-26 MED ORDER — PROMETHAZINE HCL 25 MG/ML IJ SOLN
25.0000 mg | Freq: Four times a day (QID) | INTRAMUSCULAR | Status: DC | PRN
  Administered 2012-09-26 – 2012-10-02 (×14): 25 mg via INTRAVENOUS
  Filled 2012-09-26 (×14): qty 1

## 2012-09-26 MED ORDER — VITAMIN B-1 100 MG PO TABS
100.0000 mg | ORAL_TABLET | Freq: Every day | ORAL | Status: DC
  Filled 2012-09-26: qty 1

## 2012-09-26 NOTE — H&P (Signed)
[facility] History and Physical  [name] ZOX:[id number] DOB: Jul 26, 1975    PCP:   None, unassigned.  Chief Complaint: abdominal pain, nausea and vomiting.  HPI: [name] is an 37 y.o. female with hx of alcohol abuse, still actively drinking, esophageal varices, asthma, anxiety, chronic pancreatitis, presents to the ER after drinking daily about half a gallon of hard liquor with nausea, vomiting and abdominal pain.  She has been having chronic abdominal pain but with worsen pain tonight.  She has had no fever, chills, black or bloody stool.  She had one episode of dark vomitus, but mostly clear.  Evaluation in the ER included a lipase of 64, no leukocytosis, and normal Hb, with elevated LFTs including ALT of 614, AST of 116, and alkPHOS 112.  Her UA showed trichomonas and positive for leukocytes.  Hospitalist was asked to admit her for dehydration, chronic pancreatitis, and intractable nausea vomiting.  Rewiew of Systems:  Constitutional: Negative for malaise, fever and chills. No significant weight loss or weight gain Eyes: Negative for eye pain, redness and discharge, diplopia, visual changes, or flashes of light. ENMT: Negative for ear pain, hoarseness, nasal congestion, sinus pressure and sore throat. No headaches; tinnitus, drooling, or problem swallowing. Cardiovascular: Negative for chest pain, palpitations, diaphoresis, dyspnea and peripheral edema. ; No orthopnea, PND Respiratory: Negative for cough, hemoptysis, wheezing and stridor. No pleuritic chestpain. Gastrointestinal: Negative for  diarrhea, constipation,  melena, blood in stool, hematemesis, jaundice and rectal bleeding.    Genitourinary: Negative for frequency, dysuria, incontinence,flank pain and hematuria; Musculoskeletal: Negative for back pain and neck pain. Negative for swelling and trauma.;  Skin: . Negative for pruritus, rash, abrasions, bruising and skin lesion.; ulcerations Neuro: Negative for  headache, lightheadedness and neck stiffness. Negative for weakness, altered level of consciousness , altered mental status, extremity weakness, burning feet, involuntary movement, seizure and syncope.  Psych: negative for anxiety, depression, insomnia, tearfulness, panic attacks, hallucinations, paranoia, suicidal or homicidal ideation    Past Medical History  Diagnosis Date  . Anxiety   . Asthma   . Esophageal varices   . Pancreatitis   . Alcohol abuse     Past Surgical History  Procedure Laterality Date  . Hernia repair      Medications:  HOME MEDS: Prior to Admission medications   Medication Sig Start Date End Date Taking? Authorizing Provider  albuterol (PROVENTIL HFA;VENTOLIN HFA) 108 (90 BASE) MCG/ACT inhaler Inhale 2 puffs into the lungs every 6 (six) hours as needed for wheezing.   Yes Historical Provider, MD  DM-Doxylamine-Acetaminophen (NYQUIL COLD & FLU PO) Take 30 mLs by mouth daily as needed (for cold).   Yes Historical Provider, MD     Allergies:  Allergies  Allergen Reactions  . Erythromycin Anaphylaxis  . Sulfa Antibiotics Anaphylaxis  . Tylenol (Acetaminophen) Other (See Comments)    Pancreatitis and liver dysfunction, not supposed to take med  . Nsaids Other (See Comments)    Causes GI problems, very bad reaction-per patient.     Social History:   reports that she has been smoking.  She does not have any smokeless tobacco history on file. She reports that  drinks alcohol. She reports that she uses illicit drugs (Cocaine).  Family History: History reviewed. No pertinent family history.   Physical Exam: Filed Vitals:   09/26/12 1420 09/26/12 2000 09/26/12 2045 09/26/12 2149  BP: 105/89 113/77 112/63 127/83  Pulse: 141 102 96 84  Temp:    98 F (36.7 C)  TempSrc:    Oral  Resp: 20 19 22 20   Height:    5\' 8"  (1.727 m)  Weight:    60.499 kg (133 lb 6 oz)  SpO2: 100% 100% 100% 100%   Blood pressure 127/83, pulse 84, temperature 98 F (36.7 C),  temperature source Oral, resp. rate 20, height 5\' 8"  (1.727 m), weight 60.499 kg (133 lb 6 oz), SpO2 100.00%.  GEN:  Pleasant  patient lying in the stretcher in no acute distress; cooperative with exam. PSYCH:  alert and oriented x4; does not appear anxious or depressed; affect is appropriate. HEENT: Mucous membranes pink and anicteric; PERRLA; EOM intact; no cervical lymphadenopathy nor thyromegaly or carotid bruit; no JVD; There were no stridor. Neck is very supple. Breasts:: Not examined CHEST WALL: No tenderness CHEST: Normal respiration, clear to auscultation bilaterally.  HEART: Regular rate and rhythm.  There are no murmur, rub, or gallops.   BACK: No kyphosis or scoliosis; no CVA tenderness ABDOMEN: soft and diffusely tender.  no masses, no organomegaly, normal abdominal bowel sounds; no pannus; no intertriginous candida. There is no rebound and no distention. Rectal Exam: Not done EXTREMITIES: No bone or joint deformity; age-appropriate arthropathy of the hands and knees; no edema; no ulcerations.  There is no calf tenderness. Genitalia: not examined PULSES: 2+ and symmetric SKIN: Normal hydration no rash or ulceration CNS: Cranial nerves 2-12 grossly intact no focal lateralizing neurologic deficit.  Speech is fluent; uvula elevated with phonation, facial symmetry and tongue midline. DTR are normal bilaterally, cerebella exam is intact, barbinski is negative and strengths are equaled bilaterally.  No sensory loss.   Labs on Admission:  Basic Metabolic Panel:  Recent Labs Lab 09/26/12 1525  NA 131*  K 4.6  CL 94*  CO2 12*  GLUCOSE 224*  BUN 9  CREATININE 0.52  CALCIUM 9.3   Liver Function Tests:  Recent Labs Lab 09/26/12 1525  AST 614*  ALT 116*  ALKPHOS 112  BILITOT 2.0*  PROT 8.2  ALBUMIN 3.6    Recent Labs Lab 09/26/12 1428  LIPASE 64*   No results found for this basename: AMMONIA,  in the last 168 hours CBC:  Recent Labs Lab 09/26/12 1428  WBC 5.7   NEUTROABS 3.0  HGB 14.3  HCT 41.6  MCV 96.1  PLT 115*   Cardiac Enzymes: No results found for this basename: CKTOTAL, CKMB, CKMBINDEX, TROPONINI,  in the last 168 hours  CBG: No results found for this basename: GLUCAP,  in the last 168 hours   Radiological Exams on Admission: No results found.  Assessment/Plan Present on Admission:  . Alcohol abuse . Tobacco abuse . Thrombocytopenia . Cocaine abuse . Bipolar 2 disorder . Trichomonas infection . Dehydration  PLAN:  Will admit her for pancreatitis due to active alcohol abuse.  She has protein malnutrition and is dehydrated.  She has risk for alcohol withdrawal.  Will give IVF, pain meds, and will Tx her Trichomonas with Flagyl.  She was told it is an STD also.  Her home meds were continued.  She will be placed on CIWA protocol.  Will admit to [facility] under telemetry.   Thank you for allowing me to partake in the care of this nice patient.  Other plans as per orders.  Code Status: FULL Unk Lightning, MD. [facility] Pager [phone] 7pm to 7am.  09/26/2012, 10:02 PM

## 2012-09-26 NOTE — ED Notes (Signed)
PIV infiltrated. Called IV Team RN for restart.

## 2012-09-26 NOTE — ED Provider Notes (Signed)
History    CSN: [id number] Arrival date & time 09/26/12  1411  First MD Initiated Contact with Patient 09/26/12 1513     Chief Complaint  Patient presents with  . Abdominal Pain  . Emesis   (Consider location/radiation/quality/duration/timing/severity/associated sxs/prior Treatment) HPI Comments: Pt w/ hx of chronic ETOH and substance abuse and chronic pancreatitis now w/ abd pain. States pain has been gradually increasing in severity x 6 wks, constant, daily, aching/burning, non radiating. Unable to tolerate PO x sev wks. Daily emesis. Constant nausea. One episode of coffee ground emesis and melanotic stool 1 wk ago. + hx of esophageal varices. Continues to drink daily. Drank 1 gallon whiskey last weekend. + wt loss 2/2 malnutrition. Denies fever, cough or dyspnea. No GU sx. Came to ED today because of worsening pain - currently 8/10.   Patient is a 37 y.o. female presenting with abdominal pain and vomiting.  Abdominal Pain This is a chronic problem. The current episode started more than 1 year ago. The problem occurs constantly. The problem has been gradually worsening. Associated symptoms include abdominal pain, nausea and vomiting. Pertinent negatives include no chest pain, chills, congestion, coughing, fever, headaches, rash or sore throat. She has tried nothing for the symptoms. The treatment provided no relief.  Emesis Associated symptoms: abdominal pain   Associated symptoms: no chills, no diarrhea, no headaches and no sore throat    Past Medical History  Diagnosis Date  . Anxiety   . Asthma   . Esophageal varices   . Pancreatitis   . Alcohol abuse    Past Surgical History  Procedure Laterality Date  . Hernia repair     History reviewed. No pertinent family history. History  Substance Use Topics  . Smoking status: Current Every Day Smoker  . Smokeless tobacco: Not on file  . Alcohol Use: Yes     Comment: "half gallon of liquor a day"   OB History   Grav Para Term  Preterm Abortions TAB SAB Ect Mult Living                 Review of Systems  Constitutional: Negative for fever and chills.  HENT: Negative for congestion and sore throat.   Respiratory: Negative for cough and shortness of breath.   Cardiovascular: Negative for chest pain and leg swelling.  Gastrointestinal: Positive for nausea, vomiting and abdominal pain. Negative for diarrhea and constipation.  Genitourinary: Negative for dysuria and frequency.  Skin: Negative for color change and rash.  Neurological: Negative for dizziness and headaches.  Psychiatric/Behavioral: Negative for confusion and agitation.  All other systems reviewed and are negative.    Allergies  Erythromycin; Sulfa antibiotics; and Tylenol  Home Medications   Current Outpatient Rx  Name  Route  Sig  Dispense  Refill  . albuterol (PROVENTIL HFA;VENTOLIN HFA) 108 (90 BASE) MCG/ACT inhaler   Inhalation   Inhale 2 puffs into the lungs every 6 (six) hours as needed for wheezing.          BP 105/89  Pulse 141  Resp 20  SpO2 100% Physical Exam  Vitals reviewed. Constitutional: She is oriented to person, place, and time. She appears well-developed and well-nourished. No distress.  HENT:  Head: Normocephalic and atraumatic.  Mouth/Throat: Mucous membranes are dry.  Eyes: EOM are normal. Pupils are equal, round, and reactive to light.  Neck: Normal range of motion. Neck supple.  Cardiovascular: Regular rhythm.  Tachycardia present.   Pulmonary/Chest: Effort normal. No respiratory distress.  Abdominal:  Soft. She exhibits no distension. There is tenderness in the right upper quadrant, epigastric area and left upper quadrant. There is no rigidity, no rebound, no guarding, no CVA tenderness, no tenderness at McBurney's point and negative Murphy's sign.  Musculoskeletal: Normal range of motion. She exhibits no edema.  Neurological: She is alert and oriented to person, place, and time.  Skin: Skin is warm and dry.   Psychiatric: She has a normal mood and affect. Her behavior is normal.    ED Course  Procedures (including critical care time) Results for orders placed during the hospital encounter of 09/26/12  CBC WITH DIFFERENTIAL      Result Value Range   WBC 5.7  4.0 - 10.5 K/uL   RBC 4.33  3.87 - 5.11 MIL/uL   Hemoglobin 14.3  12.0 - 15.0 g/dL   HCT 40.9  81.1 - 91.4 %   MCV 96.1  78.0 - 100.0 fL   MCH 33.0  26.0 - 34.0 pg   MCHC 34.4  30.0 - 36.0 g/dL   RDW 78.2  95.6 - 21.3 %   Platelets 115 (*) 150 - 400 K/uL   Neutrophils Relative % 52  43 - 77 %   Neutro Abs 3.0  1.7 - 7.7 K/uL   Lymphocytes Relative 33  12 - 46 %   Lymphs Abs 1.9  0.7 - 4.0 K/uL   Monocytes Relative 11  3 - 12 %   Monocytes Absolute 0.6  0.1 - 1.0 K/uL   Eosinophils Relative 3  0 - 5 %   Eosinophils Absolute 0.2  0.0 - 0.7 K/uL   Basophils Relative 1  0 - 1 %   Basophils Absolute 0.0  0.0 - 0.1 K/uL  LIPASE, BLOOD      Result Value Range   Lipase 64 (*) 11 - 59 U/L  URINALYSIS, ROUTINE W REFLEX MICROSCOPIC      Result Value Range   Color, Urine YELLOW  YELLOW   APPearance CLEAR  CLEAR   Specific Gravity, Urine 1.025  1.005 - 1.030   pH 6.0  5.0 - 8.0   Glucose, UA 250 (*) NEGATIVE mg/dL   Hgb urine dipstick NEGATIVE  NEGATIVE   Bilirubin Urine LARGE (*) NEGATIVE   Ketones, ur >80 (*) NEGATIVE mg/dL   Protein, ur 086 (*) NEGATIVE mg/dL   Urobilinogen, UA >5.7 (*) 0.0 - 1.0 mg/dL   Nitrite POSITIVE (*) NEGATIVE   Leukocytes, UA TRACE (*) NEGATIVE  COMPREHENSIVE METABOLIC PANEL      Result Value Range   Sodium 131 (*) 135 - 145 mEq/L   Potassium 4.6  3.5 - 5.1 mEq/L   Chloride 94 (*) 96 - 112 mEq/L   CO2 12 (*) 19 - 32 mEq/L   Glucose, Bld 224 (*) 70 - 99 mg/dL   BUN 9  6 - 23 mg/dL   Creatinine, Ser 8.46  0.50 - 1.10 mg/dL   Calcium 9.3  8.4 - 96.2 mg/dL   Total Protein 8.2  6.0 - 8.3 g/dL   Albumin 3.6  3.5 - 5.2 g/dL   AST 952 (*) 0 - 37 U/L   ALT 116 (*) 0 - 35 U/L   Alkaline Phosphatase 112   39 - 117 U/L   Total Bilirubin 2.0 (*) 0.3 - 1.2 mg/dL   GFR calc non Af Amer >90  >90 mL/min   GFR calc Af Amer >90  >90 mL/min  URINE RAPID DRUG SCREEN (HOSP PERFORMED)  Result Value Range   Opiates NONE DETECTED  NONE DETECTED   Cocaine POSITIVE (*) NONE DETECTED   Benzodiazepines NONE DETECTED  NONE DETECTED   Amphetamines NONE DETECTED  NONE DETECTED   Tetrahydrocannabinol NONE DETECTED  NONE DETECTED   Barbiturates NONE DETECTED  NONE DETECTED  URINE MICROSCOPIC-ADD ON      Result Value Range   Squamous Epithelial / LPF MANY (*) RARE   WBC, UA 0-2  <3 WBC/hpf   Bacteria, UA FEW (*) RARE   Urine-Other TRICHOMONAS PRESENT    CG4 I-STAT (LACTIC ACID)      Result Value Range   Lactic Acid, Venous 2.64 (*) 0.5 - 2.2 mmol/L  OCCULT BLOOD, POC DEVICE      Result Value Range   Fecal Occult Bld NEGATIVE  NEGATIVE  POCT PREGNANCY, URINE      Result Value Range   Preg Test, Ur NEGATIVE  NEGATIVE   Angiocath insertion Performed by: Audelia Hives  Consent: Verbal consent obtained. Risks and benefits: risks, benefits and alternatives were discussed Time out: Immediately prior to procedure a "time out" was called to verify the correct patient, procedure, equipment, support staff and site/side marked as required.  Preparation: Patient was prepped and draped in the usual sterile fashion.  Vein Location: left brachial  Ultrasound Guided  Gauge: 20  Normal blood return and flush without difficulty Patient tolerance: Patient tolerated the procedure well with no immediate complications.     No results found. No diagnosis found.  MDM  Exam as above - significant for BUQ and epigastric tenderness, abd soft, no guarding/rebound or clinical peritonitis, pt w/ chronic ETOH pancreatitis, continues to drink ETOH on daily basis. abd pain likely progression of known disease. Appears clinically dehydrated. Will give IVF resuscitation, zofran, obtain CBC, CMP, lipase, u/a lactic  acid, hcg.   Course: reassessed, vitals stable, tachycardia improving w/ IVF. hcg neg, u/a c/w uti - given 1 gm rocephin, cocaine + uds, hemoccult neg, lactic acid 2.64, cmp c/w dehydration - non gapped met acidosis - HCO3 12, hyponatremia/hypochloremia - given additional 1L IVF. AST/ALT elevated c/w ETOH hepatitis, lipase 64. abd remains soft and benign. D/w medicine and pt admitted for dehydration, acidosis, etoh hepatitis, chronic pancreatitis. Admit in stable condition  I have personally reviewed labs and considered in my MDM. Case d/w Dr Patria Mane  Impression:  1. Dehydration 2. Abdominal pain 3. Alcoholic hepatitis  4. Metabolic acidosis   Audelia Hives, MD 09/27/12 [phone]

## 2012-09-26 NOTE — ED Notes (Signed)
IV team called and are on way to start IV asap.

## 2012-09-26 NOTE — ED Notes (Signed)
Pt in c/o abd pain with n/v x6 weeks, pt with history of pancreatitis and states over the weekend she drank a total of a gallon of whiskey. Pt actively vomiting in triage. Pt c/o extreme abd pain, states she vomits every day and has trouble keeping most foods down, some episodes of vomiting blood, history of esophogeal varices. Last noted blood in vomit two weeks ago. Also weight loss over last few weeks.

## 2012-09-27 ENCOUNTER — Encounter (HOSPITAL_COMMUNITY): Payer: Self-pay | Admitting: *Deleted

## 2012-09-27 ENCOUNTER — Inpatient Hospital Stay (HOSPITAL_COMMUNITY): Payer: Medicare Other

## 2012-09-27 LAB — GLUCOSE, CAPILLARY
Glucose-Capillary: 154 mg/dL — ABNORMAL HIGH (ref 70–99)
Glucose-Capillary: 100 mg/dL — ABNORMAL HIGH (ref 70–99)
Glucose-Capillary: 136 mg/dL — ABNORMAL HIGH (ref 70–99)
Glucose-Capillary: 120 mg/dL — ABNORMAL HIGH (ref 70–99)

## 2012-09-27 LAB — HEMOGLOBIN A1C
Hgb A1c MFr Bld: 5.2 % (ref <5.7)
Mean Plasma Glucose: 103 mg/dL (ref <117)

## 2012-09-27 LAB — URINE CULTURE: Colony Count: 8000

## 2012-09-27 LAB — RPR: RPR Ser Ql: NONREACTIVE

## 2012-09-27 LAB — HIV ANTIBODY (ROUTINE TESTING W REFLEX): HIV: NONREACTIVE

## 2012-09-27 MED ORDER — INSULIN ASPART 100 UNIT/ML ~~LOC~~ SOLN
0.0000 [IU] | Freq: Three times a day (TID) | SUBCUTANEOUS | Status: DC
  Administered 2012-09-27: 3 [IU] via SUBCUTANEOUS
  Administered 2012-09-27: 2 [IU] via SUBCUTANEOUS

## 2012-09-27 MED ORDER — IOHEXOL 300 MG/ML  SOLN
25.0000 mL | INTRAMUSCULAR | Status: AC
  Administered 2012-09-27 (×2): 25 mL via ORAL

## 2012-09-27 MED ORDER — LORAZEPAM 2 MG/ML IJ SOLN
1.0000 mg | Freq: Three times a day (TID) | INTRAMUSCULAR | Status: AC | PRN
  Administered 2012-09-27: 1 mg via INTRAVENOUS
  Filled 2012-09-27: qty 1

## 2012-09-27 MED ORDER — PNEUMOCOCCAL VAC POLYVALENT 25 MCG/0.5ML IJ INJ
0.5000 mL | INJECTION | INTRAMUSCULAR | Status: AC
  Administered 2012-09-28: 0.5 mL via INTRAMUSCULAR
  Filled 2012-09-27: qty 0.5

## 2012-09-27 MED ORDER — LORAZEPAM 1 MG PO TABS
1.0000 mg | ORAL_TABLET | Freq: Three times a day (TID) | ORAL | Status: AC | PRN
  Administered 2012-09-29 – 2012-09-30 (×3): 1 mg via ORAL
  Filled 2012-09-27 (×3): qty 1

## 2012-09-27 MED ORDER — VITAMIN B-1 100 MG PO TABS
100.0000 mg | ORAL_TABLET | Freq: Every day | ORAL | Status: DC
  Administered 2012-09-27 – 2012-10-02 (×6): 100 mg via ORAL
  Filled 2012-09-27 (×6): qty 1

## 2012-09-27 MED ORDER — HYDROMORPHONE HCL PF 1 MG/ML IJ SOLN
1.0000 mg | INTRAMUSCULAR | Status: DC | PRN
  Administered 2012-09-27 – 2012-09-29 (×8): 1 mg via INTRAVENOUS
  Filled 2012-09-27 (×8): qty 1

## 2012-09-27 MED ORDER — BOOST / RESOURCE BREEZE PO LIQD
1.0000 | Freq: Three times a day (TID) | ORAL | Status: DC
  Administered 2012-09-27 – 2012-09-29 (×5): 1 via ORAL

## 2012-09-27 MED ORDER — IOHEXOL 300 MG/ML  SOLN
100.0000 mL | Freq: Once | INTRAMUSCULAR | Status: AC | PRN
  Administered 2012-09-27: 100 mL via INTRAVENOUS

## 2012-09-27 MED ORDER — WHITE PETROLATUM GEL
Status: AC
  Administered 2012-09-27: 14:00:00
  Filled 2012-09-27: qty 5

## 2012-09-27 MED ORDER — FOLIC ACID 1 MG PO TABS
1.0000 mg | ORAL_TABLET | Freq: Every day | ORAL | Status: DC
  Administered 2012-09-27 – 2012-10-02 (×6): 1 mg via ORAL
  Filled 2012-09-27 (×6): qty 1

## 2012-09-27 MED ORDER — ADULT MULTIVITAMIN W/MINERALS CH
1.0000 | ORAL_TABLET | Freq: Every day | ORAL | Status: DC
  Administered 2012-09-27 – 2012-10-02 (×6): 1 via ORAL
  Filled 2012-09-27 (×6): qty 1

## 2012-09-27 MED ORDER — THIAMINE HCL 100 MG/ML IJ SOLN
100.0000 mg | Freq: Every day | INTRAMUSCULAR | Status: DC
  Filled 2012-09-27 (×6): qty 1

## 2012-09-27 MED ORDER — HYDROMORPHONE HCL PF 1 MG/ML IJ SOLN
1.0000 mg | Freq: Four times a day (QID) | INTRAMUSCULAR | Status: DC | PRN
  Administered 2012-09-27: 1 mg via INTRAVENOUS
  Filled 2012-09-27: qty 1

## 2012-09-27 NOTE — Progress Notes (Signed)
[facility] PROGRESS NOTE  [name]:[id number] DOB: 03-Dec-1975 DOA: 09/26/2012 PCP: No primary provider on file.  Assessment/Plan: Principal Problem:   Alcohol abuse Active Problems:   Bipolar 2 disorder   Tobacco abuse   Cocaine abuse   Thrombocytopenia   Trichomonas infection   Dehydration     Alcohol abuse Continue CIWA protocol Social work consult for cocaine abuse Folic acid/thiamine   Elevated lipase, blood in CT scan of the abdomen and pelvis Follow LFT LFT pattern consistent with alcohol abuse  Trichomonas continue IV Flagyl  UTI continue Rocephin  Weight loss concern for HIV, HIV antibody testing pending   Thrombocytopenia consistent with alcohol use  Code Status: full Family Communication: family updated about patient's clinical progress Disposition Plan:  As above    Brief narrative: [name] is an 37 y.o. female with hx of alcohol abuse, still actively drinking, esophageal varices, asthma, anxiety, chronic pancreatitis, presents to the ER after drinking daily about half a gallon of hard liquor with nausea, vomiting and abdominal pain. She has been having chronic abdominal pain but with worsen pain tonight. She has had no fever, chills, black or bloody stool. She had one episode of dark vomitus, but mostly clear. Evaluation in the ER included a lipase of 64, no leukocytosis, and normal Hb, with elevated LFTs including ALT of 614, AST of 116, and alkPHOS 112. Her UA showed trichomonas and positive for leukocytes. Hospitalist was asked to admit her for dehydration, chronic pancreatitis, and intractable nausea vomiting.   Consultants: None Procedures:  None  Antibiotics:  Rocephin/Flagyl  HPI/Subjective: Extremely somnolent and sedated  Objective: Filed Vitals:   09/26/12 2000 09/26/12 2045 09/26/12 2149 09/27/12 0427  BP: 113/77 112/63 127/83 102/67  Pulse: 102 96 84 98  Temp:   98 F (36.7 C) 98.5 F (36.9 C)  TempSrc:    Oral Oral  Resp: 19 22 20 20   Height:   5\' 8"  (1.727 m)   Weight:   60.499 kg (133 lb 6 oz)   SpO2: 100% 100% 100% 96%    Intake/Output Summary (Last 24 hours) at 09/27/12 0746 Last data filed at 09/27/12 0430  Gross per 24 hour  Intake    480 ml  Output      0 ml  Net    480 ml    Exam:  HENT:  Head: Atraumatic.  Nose: Nose normal.  Mouth/Throat: Oropharynx is clear and moist.  Eyes: Conjunctivae are normal. Pupils are equal, round, and reactive to light. No scleral icterus.  Neck: Neck supple. No tracheal deviation present.  Cardiovascular: Normal rate, regular rhythm, normal heart sounds and intact distal pulses.  Pulmonary/Chest: Effort normal and breath sounds normal. No respiratory distress.  Abdominal: Soft. Normal appearance and bowel sounds are normal. She exhibits no distension. There is no tenderness.  Musculoskeletal: She exhibits no edema and no tenderness.  Neurological: Somnolent No cranial nerve deficit.    Data Reviewed: Basic Metabolic Panel:  Recent Labs Lab 09/26/12 1525  NA 131*  K 4.6  CL 94*  CO2 12*  GLUCOSE 224*  BUN 9  CREATININE 0.52  CALCIUM 9.3    Liver Function Tests:  Recent Labs Lab 09/26/12 1525  AST 614*  ALT 116*  ALKPHOS 112  BILITOT 2.0*  PROT 8.2  ALBUMIN 3.6    Recent Labs Lab 09/26/12 1428  LIPASE 64*   No results found for this basename: AMMONIA,  in the last 168 hours  CBC:  Recent  Labs Lab 09/26/12 1428  WBC 5.7  NEUTROABS 3.0  HGB 14.3  HCT 41.6  MCV 96.1  PLT 115*    Cardiac Enzymes: No results found for this basename: CKTOTAL, CKMB, CKMBINDEX, TROPONINI,  in the last 168 hours BNP (last 3 results) No results found for this basename: PROBNP,  in the last 8760 hours   CBG: No results found for this basename: GLUCAP,  in the last 168 hours  No results found for this or any previous visit (from the past 240 hour(s)).   Studies: No results found.  Scheduled Meds: . folic acid  1 mg  Oral Daily  . insulin aspart  0-15 Units Subcutaneous TID WC  . LORazepam  0-4 mg Intravenous Q6H   Followed by  . [START ON 09/28/2012] LORazepam  0-4 mg Intravenous Q12H  . metronidazole  500 mg Intravenous Q8H  . multivitamin with minerals  1 tablet Oral Daily  . [START ON 09/28/2012] pneumococcal 23 valent vaccine  0.5 mL Intramuscular Tomorrow-1000  . sodium chloride  3 mL Intravenous Q12H  . thiamine  100 mg Oral Daily   Or  . thiamine  100 mg Intravenous Daily   Continuous Infusions: . dextrose 5 % and 0.9 % NaCl with KCl 20 mEq/L 125 mL/hr at 09/26/12 2334    Principal Problem:   Alcohol abuse Active Problems:   Bipolar 2 disorder   Tobacco abuse   Cocaine abuse   Thrombocytopenia   Trichomonas infection   Dehydration    Time spent: 40 minutes   [facility]  [facility] Pager [phone]. If 8PM-8AM, please contact night-coverage at [email or web address], password [facility] 09/27/2012, 7:46 AM  LOS: 1 day

## 2012-09-27 NOTE — Progress Notes (Signed)
INITIAL NUTRITION ASSESSMENT  DOCUMENTATION CODES Per approved criteria  -Not Applicable   INTERVENTION: 1. MD please Monitor magnesium, potassium, and phosphorus daily for at least 3 days, MD to replete as needed, as pt is at risk for refeeding syndrome given likely malnutrition and alcohol abuse.  2. Resource Breeze po TID, each supplement provides 250 kcal and 9 grams of protein.   NUTRITION DIAGNOSIS: Unintentional weight loss related to alcohol abuse as evidenced by weight hx.   Goal: PO intake to meet >/=90% estimated nutrition needs.   Monitor:  Labs, diet advance, weight trends, PO intake   Reason for Assessment: Malnutrition Screening Tool  37 y.o. female  Admitting Dx: Alcohol abuse  ASSESSMENT: Pt admitted from ED after drinking about 1/2 gallon of liquor, and presenting with abdominal pain and nausea. With pancreatitis due to active alcohol abuse. MD notes indicate "protein malnutrition" Weight hx shows 37 lbs (22% body weight) loss in the past year.  Pt sleeping at time of RD visit, wakes to name call and starts to answer questions but falls back to sleep mid sentence. Unable to provide any additional information. No family present. Pt likely not meeting protein needs with excessive alcohol consumption.   Pt likely with some level of malnutrition. Given weight loss and alcohol abuse, pt is at increased risk for refeeding syndrome. Recommend monitor Mag, Phos, and Potassium x 3 days and replete as needed.     Height: Ht Readings from Last 1 Encounters:  09/26/12 5\' 8"  (1.727 m)    Weight: Wt Readings from Last 1 Encounters:  09/26/12 133 lb 6 oz (60.499 kg)    Ideal Body Weight: 140 lbs   % Ideal Body Weight: 95%  Wt Readings from Last 10 Encounters:  09/26/12 133 lb 6 oz (60.499 kg)  11/13/11 170 lb 1.6 oz (77.157 kg)    Usual Body Weight: 170 lbs about 1 year ago per weight hx.   % Usual Body Weight: 78%  BMI:  Body mass index is 20.28 kg/(m^2).  WNL  Estimated Nutritional Needs: Kcal: 1800-2000 Protein: 90-100 gm  Fluid: 1.8-2 L   Skin: intact   Diet Order: Clear Liquid  EDUCATION NEEDS: -No education needs identified at this time   Intake/Output Summary (Last 24 hours) at 09/27/12 0808 Last data filed at 09/27/12 0430  Gross per 24 hour  Intake    480 ml  Output      0 ml  Net    480 ml    Last BM: PTA    Labs:   Recent Labs Lab 09/26/12 1525  NA 131*  K 4.6  CL 94*  CO2 12*  BUN 9  CREATININE 0.52  CALCIUM 9.3  GLUCOSE 224*    CBG (last 3)   Recent Labs  09/27/12 0758  GLUCAP 154*    Scheduled Meds: . folic acid  1 mg Oral Daily  . insulin aspart  0-15 Units Subcutaneous TID WC  . LORazepam  0-4 mg Intravenous Q6H   Followed by  . [START ON 09/28/2012] LORazepam  0-4 mg Intravenous Q12H  . metronidazole  500 mg Intravenous Q8H  . multivitamin with minerals  1 tablet Oral Daily  . [START ON 09/28/2012] pneumococcal 23 valent vaccine  0.5 mL Intramuscular Tomorrow-1000  . sodium chloride  3 mL Intravenous Q12H  . thiamine  100 mg Oral Daily   Or  . thiamine  100 mg Intravenous Daily    Continuous Infusions: . dextrose 5 % and 0.9 %  NaCl with KCl 20 mEq/L 125 mL/hr at 09/26/12 2334    Past Medical History  Diagnosis Date  . Anxiety   . Asthma   . Esophageal varices   . Pancreatitis   . Alcohol abuse     Past Surgical History  Procedure Laterality Date  . Hernia repair      Clarene Duke RD, LDN Pager [phone] After Hours pager [phone]

## 2012-09-27 NOTE — Progress Notes (Signed)
Patient admitted from the ED at approximately 2147 via stretcher.  She is A&Ox4.  She is anxious but cooperative.  She is from home alone with her cats.  Her mother lives in a different county and per patient, they argue frequently.  Her mother did come to watch her cats so that she can come to the hospital.  She is impulsive and needed frequent reminding of conversations that took place just seconds prior.  Her CIWA score was 19.  She is complaining of mid, upper abdominal pain and severe nausea.  At the same time, she is wanting something to eat.  Explained that she is on Clear Liquid Diet only.  She states she has cut herself in the past.  Also, explained the reason behind her keeping her telemetry box on.  Per Dr. Conley Rolls, advised patient that she had trichomonas and it was being treated with IV Flagyl.  Advised patient that I would print information out and give to her when she was more alert.  Also, explained to patient that she was high fall risk and the reason behind the yellow armband, red socks, and bed alarm.  She is refusing red socks at this time because her feet were too hot.  Also, she states that she has had multiple sexual partners and unprotected sex.  She is concerned about AIDS especially with her weight loss and nausea/vomiting.  Called MD and received order for HIV testing.  Belongings at patient's bedside but she is too drowsy after receiving IV Phen, IV Dilaudid, and IV Ativan to go through belongings.  Unable to sign Safety Plan at this time for the same reason.  Will continue to monitor patient.   Jaicob Dia RN, Justine Null

## 2012-09-27 NOTE — ED Provider Notes (Signed)
I saw and evaluated the patient, reviewed the resident's note and I agree with the findings and plan.  I was present for the IV insertion  Patiently admitted for dehydration and likely alcoholic gastritis/pancreatitis   Lyanne Co, MD 09/27/12 1924

## 2012-09-28 LAB — COMPREHENSIVE METABOLIC PANEL
ALT: 93 U/L — ABNORMAL HIGH (ref 0–35)
AST: 411 U/L — ABNORMAL HIGH (ref 0–37)
Albumin: 2.7 g/dL — ABNORMAL LOW (ref 3.5–5.2)
Alkaline Phosphatase: 75 U/L (ref 39–117)
BUN: <3 mg/dL — ABNORMAL LOW (ref 6–23)
CO2: 19 mEq/L (ref 19–32)
Calcium: 8.1 mg/dL — ABNORMAL LOW (ref 8.4–10.5)
Chloride: 109 mEq/L (ref 96–112)
Creatinine, Ser: 0.28 mg/dL — ABNORMAL LOW (ref 0.50–1.10)
GFR calc Af Amer: >90 mL/min (ref >90)
GFR calc non Af Amer: >90 mL/min (ref >90)
Glucose, Bld: 112 mg/dL — ABNORMAL HIGH (ref 70–99)
Potassium: 3.8 mEq/L (ref 3.5–5.1)
Sodium: 138 mEq/L (ref 135–145)
Total Bilirubin: 1.4 mg/dL — ABNORMAL HIGH (ref 0.3–1.2)
Total Protein: 6.1 g/dL (ref 6.0–8.3)

## 2012-09-28 LAB — MAGNESIUM: Magnesium: 1.1 mg/dL — ABNORMAL LOW (ref 1.5–2.5)

## 2012-09-28 LAB — HEPATITIS PANEL, ACUTE
HCV Ab: NEGATIVE
Hep A IgM: NEGATIVE
Hep B C IgM: NEGATIVE
Hepatitis B Surface Ag: NEGATIVE

## 2012-09-28 LAB — GLUCOSE, CAPILLARY: Glucose-Capillary: 109 mg/dL — ABNORMAL HIGH (ref 70–99)

## 2012-09-28 LAB — TSH: TSH: 2.513 u[IU]/mL (ref 0.350–4.500)

## 2012-09-28 MED ORDER — MAGNESIUM SULFATE 40 MG/ML IJ SOLN
2.0000 g | Freq: Once | INTRAMUSCULAR | Status: AC
  Administered 2012-09-28: 2 g via INTRAVENOUS
  Filled 2012-09-28: qty 50

## 2012-09-28 MED ORDER — METRONIDAZOLE 500 MG PO TABS
500.0000 mg | ORAL_TABLET | Freq: Three times a day (TID) | ORAL | Status: DC
  Administered 2012-09-28 – 2012-09-29 (×3): 500 mg via ORAL
  Filled 2012-09-28 (×6): qty 1

## 2012-09-28 MED ORDER — DEXTROSE 5 % IV SOLN
1.0000 g | INTRAVENOUS | Status: DC
  Administered 2012-09-28 – 2012-09-30 (×3): 1 g via INTRAVENOUS
  Filled 2012-09-28 (×4): qty 10

## 2012-09-28 MED ORDER — PHENOL 1.4 % MT LIQD
1.0000 | OROMUCOSAL | Status: DC | PRN
  Filled 2012-09-28: qty 177

## 2012-09-28 NOTE — Progress Notes (Signed)
[facility] PROGRESS NOTE  [name] MVH:[id number] DOB: [birth year] DOA: 09/26/2012 PCP: No primary provider on file.  Assessment/Plan: Principal Problem:   Alcohol abuse Active Problems:   Bipolar 2 disorder   Tobacco abuse   Cocaine abuse   Thrombocytopenia   Trichomonas infection   Dehydration    Alcohol abuse  Continue CIWA protocol as patient showing signs of withdrawal Social work consult for cocaine abuse  Folic acid/thiamine  Check magnesium  Transaminitis consistent with alcohol abuse Elevated lipase, CT abdomen pelvis does not show any evidence of pancreatitis but shows early cirrhotic changes in the liver Follow LFT  LFT pattern consistent with alcohol abuse  Advance diet   Trichomonas continue IV Flagyl   UTI continue Rocephin   Weight loss concern for HIV, HIV antibody testing pending   Thrombocytopenia consistent with alcohol use   Code Status: full  Family Communication: family updated about patient's clinical progress  Disposition Plan: As above    Brief narrative:  [name] is an 37 y.o. female with hx of alcohol abuse, still actively drinking, esophageal varices, asthma, anxiety, chronic pancreatitis, presents to the ER after drinking daily about half a gallon of hard liquor with nausea, vomiting and abdominal pain. She has been having chronic abdominal pain but with worsen pain tonight. She has had no fever, chills, black or bloody stool. She had one episode of dark vomitus, but mostly clear. Evaluation in the ER included a lipase of 64, no leukocytosis, and normal Hb, with elevated LFTs including ALT of 614, AST of 116, and alkPHOS 112. Her UA showed trichomonas and positive for leukocytes. Hospitalist was asked to admit her for dehydration, chronic pancreatitis, and intractable nausea vomiting.   Consultants:  None  Procedures:  None Antibiotics:  Rocephin/Flagyl HPI/Subjective:  Extremely somnolent and  sedated   Objective: Filed Vitals:   09/27/12 1657 09/27/12 2131 09/27/12 2329 09/28/12 0436  BP: 113/76 119/75  127/86  Pulse: 77 103  106  Temp: 97.7 F (36.5 C) 98.6 F (37 C)  97.9 F (36.6 C)  TempSrc:  Oral  Oral  Resp: 18 18  16   Height:      Weight:  63.141 kg (139 lb 3.2 oz)    SpO2: 100% 100% 98% 100%    Intake/Output Summary (Last 24 hours) at 09/28/12 0810 Last data filed at 09/28/12 0153  Gross per 24 hour  Intake   2305 ml  Output    300 ml  Net   2005 ml    Exam:    Cardiovascular: Normal rate, regular rhythm, normal heart sounds and intact distal pulses.  Pulmonary/Chest: Effort normal and breath sounds normal. No respiratory distress.  Abdominal: Soft. Normal appearance and bowel sounds are normal. She exhibits no distension. There is no tenderness.  Musculoskeletal: She exhibits no edema and no tenderness.  Neurological: She is alert. No cranial nerve deficit.    Data Reviewed: Basic Metabolic Panel:  Recent Labs Lab 09/26/12 1525  NA 131*  K 4.6  CL 94*  CO2 12*  GLUCOSE 224*  BUN 9  CREATININE 0.52  CALCIUM 9.3    Liver Function Tests:  Recent Labs Lab 09/26/12 1525  AST 614*  ALT 116*  ALKPHOS 112  BILITOT 2.0*  PROT 8.2  ALBUMIN 3.6    Recent Labs Lab 09/26/12 1428  LIPASE 64*   No results found for this basename: AMMONIA,  in the last 168 hours  CBC:  Recent Labs Lab 09/26/12 1428  WBC 5.7  NEUTROABS 3.0  HGB 14.3  HCT 41.6  MCV 96.1  PLT 115*    Cardiac Enzymes: No results found for this basename: CKTOTAL, CKMB, CKMBINDEX, TROPONINI,  in the last 168 hours BNP (last 3 results) No results found for this basename: PROBNP,  in the last 8760 hours   CBG:  Recent Labs Lab 09/27/12 0758 09/27/12 1118 09/27/12 1738 09/27/12 2130 09/28/12 0709  GLUCAP 154* 100* 136* 120* 109*    Recent Results (from the past 240 hour(s))  URINE CULTURE     Status: None   Collection Time    09/26/12  4:30 PM       Result Value Range Status   Specimen Description URINE, CLEAN CATCH   Final   Special Requests NONE   Final   Culture  Setup Time 09/26/2012 23:52   Final   Colony Count 8,000 COLONIES/ML   Final   Culture INSIGNIFICANT GROWTH   Final   Report Status 09/27/2012 FINAL   Final     Studies: Ct Abdomen Pelvis W Contrast  09/27/2012   *RADIOLOGY REPORT*  Clinical Data: Pancreatitis, elevated liver function tests, abdominal pain and alcohol abuse.  CT ABDOMEN AND PELVIS WITH CONTRAST  Technique:  Multidetector CT imaging of the abdomen and pelvis was performed following the standard protocol during bolus administration of intravenous contrast.  Contrast: OMNIPAQUE IOHEXOL 300 MG/ML  SOLN  Comparison: 10/10/2010  Findings: The liver again demonstrates significant and diffuse steatosis.  There is evidence of relative hepatomegaly.  No overt nodular cirrhotic changes or hepatic masses are identified.  Early cirrhosis is suspected based on some progression of periportal atrophic pattern with increased fat seen around the right and left central portal veins.  There is no evidence of biliary ductal dilatation.  The pancreas shows normal enhancement and stable appearance since the prior CT with no evidence of visible acute pancreatitis or mass by CT.  No pancreatic calcifications, abnormal fluid collections or ductal abnormalities are seen.  The spleen is not enlarged.  No obvious varices are seen.  No evidence of ascites.  The adrenal glands and kidneys are within normal limits.  The gallbladder is unremarkable. No vascular abnormalities are identified.  Bowel loops show no evidence of obstruction or inflammation.  The bladder is unremarkable.  Intrauterine device present in the uterus.  No hernias are identified.  No masses or enlarged lymph nodes are seen.  Bony structures are unremarkable.  IMPRESSION:  1.  Significant hepatic steatosis with suspected early central cirrhotic changes. 2.  No evidence of  pancreatitis or complications related to pancreatitis by CT.   Original Report Authenticated By: Irish Lack, M.D.    Scheduled Meds: . feeding supplement  1 Container Oral TID BM  . folic acid  1 mg Oral Daily  . insulin aspart  0-15 Units Subcutaneous TID WC  . LORazepam  0-4 mg Intravenous Q6H   Followed by  . LORazepam  0-4 mg Intravenous Q12H  . magnesium sulfate 1 - 4 g bolus IVPB  2 g Intravenous Once  . metronidazole  500 mg Intravenous Q8H  . multivitamin with minerals  1 tablet Oral Daily  . pneumococcal 23 valent vaccine  0.5 mL Intramuscular Tomorrow-1000  . sodium chloride  3 mL Intravenous Q12H  . thiamine  100 mg Oral Daily   Or  . thiamine  100 mg Intravenous Daily   Continuous Infusions: . dextrose 5 % and 0.9 % NaCl with KCl 20 mEq/L 125  mL/hr at 09/28/12 5284    Principal Problem:   Alcohol abuse Active Problems:   Bipolar 2 disorder   Tobacco abuse   Cocaine abuse   Thrombocytopenia   Trichomonas infection   Dehydration    Time spent: 40 minutes   [facility]  [facility] Pager [phone]. If 8PM-8AM, please contact night-coverage at [email or web address], password [facility] 09/28/2012, 8:10 AM  LOS: 2 days

## 2012-09-28 NOTE — Progress Notes (Signed)
CSW received consult for SA treatment. CSW unable to assess patient, and will follow up with unit csw to assess.   Catha Gosselin, Theresia Majors  [phone]  .09/28/2012 11:43am

## 2012-09-29 LAB — COMPREHENSIVE METABOLIC PANEL
ALT: 71 U/L — ABNORMAL HIGH (ref 0–35)
AST: 273 U/L — ABNORMAL HIGH (ref 0–37)
Albumin: 2.5 g/dL — ABNORMAL LOW (ref 3.5–5.2)
Alkaline Phosphatase: 86 U/L (ref 39–117)
BUN: 3 mg/dL — ABNORMAL LOW (ref 6–23)
CO2: 22 mEq/L (ref 19–32)
Calcium: 8.5 mg/dL (ref 8.4–10.5)
Chloride: 105 mEq/L (ref 96–112)
Creatinine, Ser: 0.37 mg/dL — ABNORMAL LOW (ref 0.50–1.10)
GFR calc Af Amer: >90 mL/min (ref >90)
GFR calc non Af Amer: >90 mL/min (ref >90)
Glucose, Bld: 138 mg/dL — ABNORMAL HIGH (ref 70–99)
Potassium: 3.1 mEq/L — ABNORMAL LOW (ref 3.5–5.1)
Sodium: 137 mEq/L (ref 135–145)
Total Bilirubin: 1.3 mg/dL — ABNORMAL HIGH (ref 0.3–1.2)
Total Protein: 5.5 g/dL — ABNORMAL LOW (ref 6.0–8.3)

## 2012-09-29 LAB — LIPASE, BLOOD: Lipase: 37 U/L (ref 11–59)

## 2012-09-29 MED ORDER — METRONIDAZOLE 500 MG PO TABS
2000.0000 mg | ORAL_TABLET | Freq: Once | ORAL | Status: AC
Start: 2012-09-29 — End: 2012-09-29
  Administered 2012-09-29: 2000 mg via ORAL
  Filled 2012-09-29: qty 4

## 2012-09-29 MED ORDER — MAGNESIUM SULFATE 40 MG/ML IJ SOLN
4.0000 g | Freq: Once | INTRAMUSCULAR | Status: AC
  Administered 2012-09-29: 4 g via INTRAVENOUS
  Filled 2012-09-29: qty 100

## 2012-09-29 MED ORDER — BOOST / RESOURCE BREEZE PO LIQD
1.0000 | Freq: Two times a day (BID) | ORAL | Status: DC
  Administered 2012-09-30 – 2012-10-02 (×5): 1 via ORAL

## 2012-09-29 MED ORDER — HYDROMORPHONE HCL PF 1 MG/ML IJ SOLN
1.0000 mg | Freq: Three times a day (TID) | INTRAMUSCULAR | Status: DC | PRN
  Administered 2012-09-29 – 2012-09-30 (×2): 1 mg via INTRAVENOUS
  Filled 2012-09-29 (×2): qty 1

## 2012-09-29 MED ORDER — POTASSIUM CHLORIDE IN NACL 20-0.9 MEQ/L-% IV SOLN
INTRAVENOUS | Status: DC
  Administered 2012-09-29: 75 mL/h via INTRAVENOUS
  Filled 2012-09-29 (×3): qty 1000

## 2012-09-29 MED ORDER — PANTOPRAZOLE SODIUM 40 MG PO TBEC
40.0000 mg | DELAYED_RELEASE_TABLET | Freq: Every day | ORAL | Status: DC
  Administered 2012-09-29 – 2012-10-02 (×4): 40 mg via ORAL
  Filled 2012-09-29 (×4): qty 1

## 2012-09-29 MED ORDER — POTASSIUM CHLORIDE CRYS ER 20 MEQ PO TBCR
40.0000 meq | EXTENDED_RELEASE_TABLET | Freq: Three times a day (TID) | ORAL | Status: AC
  Administered 2012-09-29 (×3): 40 meq via ORAL
  Filled 2012-09-29 (×3): qty 2

## 2012-09-29 NOTE — Progress Notes (Signed)
Pt refused bed alarm. Pt has fall risk socks on, with 2 side rails up. Per pt, "I promise I will not get up and I will use my call bell when I need to." Pt educated on fall risks. Pt provided teach back and understands. Will continue to monitor with door open. Gilman Schmidt

## 2012-09-29 NOTE — Evaluation (Signed)
Physical Therapy Evaluation Patient Details Name: [name] MRN: [id number] DOB: 1975-11-01 Today's Date: 09/29/2012 Time: 7829-5621 PT Time Calculation (min): 19 min  PT Assessment / Plan / Recommendation History of Present Illness  [name] is an 37 y.o. female with hx of alcohol abuse, still actively drinking, esophageal varices, asthma, anxiety, chronic pancreatitis, presents to the ER after drinking daily about half a gallon of hard liquor with nausea, vomiting and abdominal pain. She has been having chronic abdominal pain but with worsen pain tonight.  She has had no fever, chills, black or bloody stool.  She had one episode of dark vomitus, but mostly clear.  Evaluation in the ER included a lipase of 64, no leukocytosis, and normal Hb, with elevated LFTs including ALT of 614, AST of 116, and alkPHOS 112.  Her UA showed trichomonas and positive for leukocytes.  Hospitalist was asked to admit her for dehydration, chronic pancreatitis, and intractable nausea vomiting.  Clinical Impression  Pt with bilat foot drop, balance deficits, increased falls risk, and proximal weakness. Pt unsafe to return home alone. Suspect pt may have muscular distrophy like her mother due to presentation of bilat drop foot and proximal weakness. Pt reports she needs a nerve conduction test but bc she didn't have insurance she couldn't get it but her blood work is suspect of muscular dystrophy per pt report. Pt to benefit from ST-SNF placement to address mentioned deficits to achieve safe mod I function for safe transition home.     PT Assessment  Patient needs continued PT services    Follow Up Recommendations  SNF    Does the patient have the potential to tolerate intense rehabilitation      Barriers to Discharge Decreased caregiver support unclear place to stay    Equipment Recommendations  Rolling walker with 5" wheels    Recommendations for Other Services     Frequency Min 4X/week     Precautions / Restrictions Precautions Precautions: Fall Restrictions Weight Bearing Restrictions: No   Pertinent Vitals/Pain 7/10 L abdominal pain      Mobility  Bed Mobility Bed Mobility: Supine to Sit;Sit to Supine Supine to Sit: 5: Supervision;With rails;HOB elevated Sit to Supine: 5: Supervision;HOB flat Details for Bed Mobility Assistance: pt uses UEs to to assist LEs back into bed. increased time required Transfers Transfers: Sit to Stand;Stand to Sit Sit to Stand: 4: Min assist;With upper extremity assist;From bed Stand to Sit: 4: Min assist;Without upper extremity assist;To chair/3-in-1 Details for Transfer Assistance: pt very unsteady upon standing with lateral sway and LOB to the Left Ambulation/Gait Ambulation/Gait Assistance: 3: Mod assist;4: Min assist Ambulation Distance (Feet): 60 Feet Assistive device: Rolling walker;1 person hand held assist Ambulation/Gait Assistance Details: pt attempted to amb without device however had LOB requiring modA to regain balance. Pt required HHA for safe ambulation to/from bathroom. Pt amb 50 feet with RW and min guard without LOB. pt requires use of RW for safe ambulation. Pt remains to have bilat drop foot with/without deive Gait Pattern: Decreased dorsiflexion - right;Decreased dorsiflexion - left;Step-through pattern;Decreased stride length;Right steppage;Left steppage    Exercises     PT Diagnosis: Difficulty walking;Abnormality of gait;Generalized weakness;Acute pain  PT Problem List: Decreased strength;Decreased activity tolerance;Decreased balance;Decreased mobility PT Treatment Interventions: Gait training;Functional mobility training;Therapeutic activities;Therapeutic exercise;Balance training     PT Goals(Current goals can be found in the care plan section) Acute Rehab PT Goals Patient Stated Goal: when asked if patient was interested in ETOH rehab pt reported "  Yes and no" PT Goal Formulation: With patient Time For  Goal Achievement: 10/13/12 Potential to Achieve Goals: Good  Visit Information  Last PT Received On: 09/29/12 Assistance Needed: +1 History of Present Illness: [name] is an 37 y.o. female with hx of alcohol abuse, still actively drinking, esophageal varices, asthma, anxiety, chronic pancreatitis, presents to the ER after drinking daily about half a gallon of hard liquor with nausea, vomiting and abdominal pain. She has been having chronic abdominal pain but with worsen pain tonight.  She has had no fever, chills, black or bloody stool.  She had one episode of dark vomitus, but mostly clear.  Evaluation in the ER included a lipase of 64, no leukocytosis, and normal Hb, with elevated LFTs including ALT of 614, AST of 116, and alkPHOS 112.  Her UA showed trichomonas and positive for leukocytes.  Hospitalist was asked to admit her for dehydration, chronic pancreatitis, and intractable nausea vomiting.       Prior Functioning  Home Living Family/patient expects to be discharged to:: Private residence Living Arrangements: Alone Available Help at Discharge:  (none) Type of Home: House (but will be homeless as of Aug 1st per pt) Home Equipment: None Additional Comments: pt reports she will loose her house 8/3. She reports "I'll probably find a cheap motel that allows cats." Prior Function Level of Independence: Independent Communication Communication: No difficulties Dominant Hand: Right    Cognition  Cognition Arousal/Alertness: Awake/alert Behavior During Therapy: WFL for tasks assessed/performed Overall Cognitive Status: Impaired/Different from baseline Area of Impairment: Safety/judgement Safety/Judgement: Decreased awareness of safety (said she could walk without belt or A--could not safely)    Extremity/Trunk Assessment Upper Extremity Assessment Upper Extremity Assessment: RUE deficits/detail;LUE deficits/detail RUE Deficits / Details: When attempts shoulder flexion she can  only get to about 90 degrees and goes into abduction as well as uses whole body to stablize to attempt to lift arm; uses whole arm to get strongest grip RUE Coordination: decreased gross motor LUE Deficits / Details: When attempts shoulder flexion she can only get to about 90 degrees and goes into abduction as well as uses whole body to stablize to attempt to lift arm, uses whole are to get strongest grip LUE Coordination: decreased gross motor Lower Extremity Assessment Lower Extremity Assessment: RLE deficits/detail;LLE deficits/detail RLE Deficits / Details: proximal weakness and foot drop, trace dorsiflex LLE Deficits / Details: proximal weaknes and foot drop, trace dorsiflex Cervical / Trunk Assessment Cervical / Trunk Assessment: Normal   Balance Balance Balance Assessed: Yes Static Sitting Balance Static Sitting - Balance Support: No upper extremity supported Static Sitting - Level of Assistance: 4: Min assist Static Sitting - Comment/# of Minutes: 2 min while washed hands  End of Session PT - End of Session Equipment Utilized During Treatment: Gait belt Activity Tolerance: Patient limited by fatigue Patient left: in bed;with bed alarm set Nurse Communication: Mobility status  GP     Marcene Brawn 09/29/2012, 2:41 PM  Lewis Shock, PT, DPT Pager #: [phone] Office #: [phone]

## 2012-09-29 NOTE — Progress Notes (Signed)
[facility] PROGRESS NOTE  [name] WUJ:[id number] DOB: 1975/09/25 DOA: 09/26/2012 PCP: No primary provider on file.  Assessment/Plan: Principal Problem:   Alcohol abuse Active Problems:   Bipolar 2 disorder   Tobacco abuse   Cocaine abuse   Thrombocytopenia   Trichomonas infection   Dehydration    Alcohol abuse  Continue CIWA protocol as patient showing signs of withdrawal  Social work consult for cocaine abuse  Folic acid/thiamine    Severe hypokalemia hypomagnesemia replete aggressively   Transaminitis consistent with alcohol abuse    Elevated lipase, CT abdomen pelvis does not show any evidence of pancreatitis but shows early cirrhotic changes in the liver  Follow LFT  LFT pattern consistent with alcohol abuse  Advance diet    Trichomonas give him 2000 mg dose of Flagyl in DC   UTI continue Rocephin    Weight loss concern for HIV, HIV antibody negative  Thrombocytopenia consistent with alcohol use    Code Status: full  Family Communication: family updated about patient's clinical progress  Disposition Plan: PT/OT evaluation   Brief narrative:  [name] is an 37 y.o. female with hx of alcohol abuse, still actively drinking, esophageal varices, asthma, anxiety, chronic pancreatitis, presents to the ER after drinking daily about half a gallon of hard liquor with nausea, vomiting and abdominal pain. She has been having chronic abdominal pain but with worsen pain tonight. She has had no fever, chills, black or bloody stool. She had one episode of dark vomitus, but mostly clear. Evaluation in the ER included a lipase of 64, no leukocytosis, and normal Hb, with elevated LFTs including ALT of 614, AST of 116, and alkPHOS 112. Her UA showed trichomonas and positive for leukocytes. Hospitalist was asked to admit her for dehydration, chronic pancreatitis, and intractable nausea vomiting.  Consultants:  None  Procedures:  None Antibiotics:   Rocephin/Flagyl HPI/Subjective:  Awake, agitated, tachycardic, pressured speech      Objective: Filed Vitals:   09/28/12 1328 09/28/12 1658 09/28/12 2109 09/29/12 0524  BP: 133/92 126/94 130/86 124/96  Pulse: 105 116 108 110  Temp: 98.2 F (36.8 C) 98.2 F (36.8 C) 98.6 F (37 C) 99.2 F (37.3 C)  TempSrc:   Oral Oral  Resp: 18 19 18 18   Height:   5\' 8"  (1.727 m)   Weight:   63.141 kg (139 lb 3.2 oz)   SpO2: 100% 98% 100% 99%    Intake/Output Summary (Last 24 hours) at 09/29/12 0746 Last data filed at 09/29/12 0529  Gross per 24 hour  Intake 4135.83 ml  Output      0 ml  Net 4135.83 ml    Exam:  HENT:  Head: Atraumatic.  Nose: Nose normal.  Mouth/Throat: Oropharynx is clear and moist.  Eyes: Conjunctivae are normal. Pupils are equal, round, and reactive to light. No scleral icterus.  Neck: Neck supple. No tracheal deviation present.  Cardiovascular: Normal rate, regular rhythm, normal heart sounds and intact distal pulses.  Pulmonary/Chest: Effort normal and breath sounds normal. No respiratory distress.  Abdominal: Soft. Normal appearance and bowel sounds are normal. She exhibits no distension. There is no tenderness.  Musculoskeletal: She exhibits no edema and no tenderness.  Neurological: She is alert. No cranial nerve deficit.    Data Reviewed: Basic Metabolic Panel:  Recent Labs Lab 09/26/12 1525 09/28/12 0700 09/28/12 0845 09/29/12 0430  NA 131* 138  --  137  K 4.6 3.8  --  3.1*  CL 94* 109  --  105  CO2 12* 19  --  22  GLUCOSE 224* 112*  --  138*  BUN 9 <3*  --  3*  CREATININE 0.52 0.28*  --  0.37*  CALCIUM 9.3 8.1*  --  8.5  MG  --   --  1.1*  --     Liver Function Tests:  Recent Labs Lab 09/26/12 1525 09/28/12 0700 09/29/12 0430  AST 614* 411* 273*  ALT 116* 93* 71*  ALKPHOS 112 75 86  BILITOT 2.0* 1.4* 1.3*  PROT 8.2 6.1 5.5*  ALBUMIN 3.6 2.7* 2.5*    Recent Labs Lab 09/26/12 1428  LIPASE 64*   No results found for  this basename: AMMONIA,  in the last 168 hours  CBC:  Recent Labs Lab 09/26/12 1428  WBC 5.7  NEUTROABS 3.0  HGB 14.3  HCT 41.6  MCV 96.1  PLT 115*    Cardiac Enzymes: No results found for this basename: CKTOTAL, CKMB, CKMBINDEX, TROPONINI,  in the last 168 hours BNP (last 3 results) No results found for this basename: PROBNP,  in the last 8760 hours   CBG:  Recent Labs Lab 09/27/12 0758 09/27/12 1118 09/27/12 1738 09/27/12 2130 09/28/12 0709  GLUCAP 154* 100* 136* 120* 109*    Recent Results (from the past 240 hour(s))  URINE CULTURE     Status: None   Collection Time    09/26/12  4:30 PM      Result Value Range Status   Specimen Description URINE, CLEAN CATCH   Final   Special Requests NONE   Final   Culture  Setup Time 09/26/2012 23:52   Final   Colony Count 8,000 COLONIES/ML   Final   Culture INSIGNIFICANT GROWTH   Final   Report Status 09/27/2012 FINAL   Final     Studies: Ct Abdomen Pelvis W Contrast  09/27/2012   *RADIOLOGY REPORT*  Clinical Data: Pancreatitis, elevated liver function tests, abdominal pain and alcohol abuse.  CT ABDOMEN AND PELVIS WITH CONTRAST  Technique:  Multidetector CT imaging of the abdomen and pelvis was performed following the standard protocol during bolus administration of intravenous contrast.  Contrast: OMNIPAQUE IOHEXOL 300 MG/ML  SOLN  Comparison: 10/10/2010  Findings: The liver again demonstrates significant and diffuse steatosis.  There is evidence of relative hepatomegaly.  No overt nodular cirrhotic changes or hepatic masses are identified.  Early cirrhosis is suspected based on some progression of periportal atrophic pattern with increased fat seen around the right and left central portal veins.  There is no evidence of biliary ductal dilatation.  The pancreas shows normal enhancement and stable appearance since the prior CT with no evidence of visible acute pancreatitis or mass by CT.  No pancreatic calcifications,  abnormal fluid collections or ductal abnormalities are seen.  The spleen is not enlarged.  No obvious varices are seen.  No evidence of ascites.  The adrenal glands and kidneys are within normal limits.  The gallbladder is unremarkable. No vascular abnormalities are identified.  Bowel loops show no evidence of obstruction or inflammation.  The bladder is unremarkable.  Intrauterine device present in the uterus.  No hernias are identified.  No masses or enlarged lymph nodes are seen.  Bony structures are unremarkable.  IMPRESSION:  1.  Significant hepatic steatosis with suspected early central cirrhotic changes. 2.  No evidence of pancreatitis or complications related to pancreatitis by CT.   Original Report Authenticated By: Irish Lack, M.D.    Scheduled Meds: . cefTRIAXone (ROCEPHIN)  IV  1 g Intravenous Q24H  . feeding supplement  1 Container Oral TID BM  . folic acid  1 mg Oral Daily  . LORazepam  0-4 mg Intravenous Q12H  . magnesium sulfate 1 - 4 g bolus IVPB  4 g Intravenous Once  . metroNIDAZOLE  2,000 mg Oral Once  . multivitamin with minerals  1 tablet Oral Daily  . potassium chloride  40 mEq Oral TID  . sodium chloride  3 mL Intravenous Q12H  . thiamine  100 mg Oral Daily   Or  . thiamine  100 mg Intravenous Daily   Continuous Infusions: . 0.9 % NaCl with KCl 20 mEq / L    . dextrose 5 % and 0.9 % NaCl with KCl 20 mEq/L 125 mL/hr at 09/29/12 0253    Principal Problem:   Alcohol abuse Active Problems:   Bipolar 2 disorder   Tobacco abuse   Cocaine abuse   Thrombocytopenia   Trichomonas infection   Dehydration    Time spent: 40 minutes   [facility]  [facility] Pager [phone]. If 8PM-8AM, please contact night-coverage at [email or web address], password [facility] 09/29/2012, 7:46 AM  LOS: 3 days

## 2012-09-29 NOTE — Progress Notes (Signed)
NUTRITION FOLLOW UP  DOCUMENTATION CODES Per approved criteria  -Severe  malnutrition in the context of social or environmental circumstances    Intervention:   1. Decrease Resource Breeze po to BID, each supplement provides 250 kcal and 9 grams of protein.   2 Recommend continue to Monitor magnesium, potassium, and phosphorus daily for at least 3 days, MD to replete as needed, as pt is at risk for refeeding syndrome given likely malnutrition and alcohol abuse.  Nutrition Dx:   Unintentional weight loss related to alcohol abuse as evidenced by weight hx.  Goal:   PO intake to meet >/=90% estimated nutrition needs.   Monitor:   PO intake, weight trends, labs  Assessment:   RD identified pt as at risk for refeeding syndrome over the weekend. Mag drawn, low. No phos drawn. Potassium down today from yesterday.  Recommend continue to monitor Mag, Phos and potassium until WNL.  Diet has been advanced to regular, ate 65% of breakfast this morning.   Pt states she has not been eating well for 6 months. At one time did not eat anything for 17 days.   Pt meets criteria for severe malnutrition in the context of chronic illness vs social circumstances 2/2 weight loss of 22% body weight in 1 year and meeting <50% estimated nutrition needs >/=1 month.   Height: Ht Readings from Last 1 Encounters:  09/28/12 5\' 8"  (1.727 m)    Weight Status:   Wt Readings from Last 1 Encounters:  09/28/12 139 lb 3.2 oz (63.141 kg)    Re-estimated needs:  Kcal: 1800-2000  Protein: 90-100 gm  Fluid: 1.8-2 L   Skin: intact   Diet Order: General   Intake/Output Summary (Last 24 hours) at 09/29/12 1411 Last data filed at 09/29/12 0857  Gross per 24 hour  Intake 4875.83 ml  Output      0 ml  Net 4875.83 ml    Last BM: 7/14   Labs:   Recent Labs Lab 09/26/12 1525 09/28/12 0700 09/28/12 0845 09/29/12 0430  NA 131* 138  --  137  K 4.6 3.8  --  3.1*  CL 94* 109  --  105  CO2 12* 19  --   22  BUN 9 <3*  --  3*  CREATININE 0.52 0.28*  --  0.37*  CALCIUM 9.3 8.1*  --  8.5  MG  --   --  1.1*  --   GLUCOSE 224* 112*  --  138*    CBG (last 3)   Recent Labs  09/27/12 1738 09/27/12 2130 09/28/12 0709  GLUCAP 136* 120* 109*    Scheduled Meds: . cefTRIAXone (ROCEPHIN)  IV  1 g Intravenous Q24H  . feeding supplement  1 Container Oral TID BM  . folic acid  1 mg Oral Daily  . LORazepam  0-4 mg Intravenous Q12H  . multivitamin with minerals  1 tablet Oral Daily  . pantoprazole  40 mg Oral Daily  . potassium chloride  40 mEq Oral TID  . sodium chloride  3 mL Intravenous Q12H  . thiamine  100 mg Oral Daily   Or  . thiamine  100 mg Intravenous Daily    Continuous Infusions: . 0.9 % NaCl with KCl 20 mEq / L 75 mL/hr (09/29/12 1008)  . dextrose 5 % and 0.9 % NaCl with KCl 20 mEq/L 125 mL/hr at 09/29/12 0253    Clarene Duke RD, LDN Pager [phone] After Hours pager [phone]

## 2012-09-29 NOTE — Progress Notes (Signed)
Initial visit as per charge nurse's referral. Pt was lying on her bed and was alert and oriented when I visited with her. I introduced myself and explained my role and the services that I provided to patients on the unit. Pt stated that she is going through several issues at this time and asked if I would be coming by again tomorrow as she might need some counseling. Pt and I plan on meeting tomorrow afternoon.   Sherol Dade Counselor Intern Haroldine Laws

## 2012-09-29 NOTE — Evaluation (Signed)
Occupational Therapy Evaluation Patient Details Name: RITHA SAMPEDRO MRN: [id number] DOB: February 10, 1976 Today's Date: 09/29/2012 Time: 1334-1400 OT Time Calculation (min): 26 min  OT Assessment / Plan / Recommendation History of present illness ZAILEY AUDIA is an 37 y.o. female with hx of alcohol abuse, still actively drinking, esophageal varices, asthma, anxiety, chronic pancreatitis, presents to the ER after drinking daily about half a gallon of hard liquor with nausea, vomiting and abdominal pain. She has been having chronic abdominal pain but with worsen pain tonight.  She has had no fever, chills, black or bloody stool.  She had one episode of dark vomitus, but mostly clear.  Evaluation in the ER included a lipase of 64, no leukocytosis, and normal Hb, with elevated LFTs including ALT of 614, AST of 116, and alkPHOS 112.  Her UA showed trichomonas and positive for leukocytes.  Hospitalist was asked to admit her for dehydration, chronic pancreatitis, and intractable nausea vomiting.   Clinical Impression   Pt admitted with above and also pt reports that he mother also has foot drop and muscular dystrophy and that they have suspected that she has it too, but she has never had the nerve conduction study performed.. Pt currently with functional limitations due to the deficits listed below (see OT Problem List). Pt will benefit from skilled OT to increase their safety and independence with ADL and functional mobility for ADL to facilitate discharge to venue listed below.       OT Assessment  Patient needs continued OT Services    Follow Up Recommendations  SNF    Barriers to Discharge Decreased caregiver support    Equipment Recommendations  None recommended by OT       Frequency  Min 2X/week    Precautions / Restrictions Precautions Precautions: Fall Restrictions Weight Bearing Restrictions: No       ADL  Eating/Feeding: Simulated;Independent Where Assessed - Eating/Feeding:  Edge of bed Grooming: Performed;Min guard Where Assessed - Grooming: Unsupported standing Upper Body Bathing: Simulated;Set up;Supervision/safety Where Assessed - Upper Body Bathing: Unsupported sitting Lower Body Bathing: Simulated;Minimal assistance Where Assessed - Lower Body Bathing: Supported sit to stand Upper Body Dressing: Simulated;Set up;Supervision/safety Where Assessed - Upper Body Dressing: Unsupported sitting Lower Body Dressing: Simulated;Minimal assistance Where Assessed - Lower Body Dressing: Supported sit to stand Toilet Transfer: Performed;Minimal assistance Toilet Transfer Method: Sit to Barista: Regular height toilet;Grab bars Toileting - Clothing Manipulation and Hygiene: Performed;Minimal assistance Where Assessed - Engineer, mining and Hygiene: Standing Equipment Used: Rolling walker;Gait belt Transfers/Ambulation Related to ADLs: Min A for all with RWl; pt stated that she did not need any A however her balance when up on her feet said otherwise    OT Diagnosis: Generalized weakness;Cognitive deficits  OT Problem List: Decreased strength;Decreased range of motion;Decreased safety awareness;Impaired balance (sitting and/or standing);Decreased coordination OT Treatment Interventions: Self-care/ADL training;Balance training;Therapeutic activities;DME and/or AE instruction;Patient/family education   OT Goals(Current goals can be found in the care plan section) Acute Rehab OT Goals Patient Stated Goal: when asked if patient was interested in ETOH rehab pt reported "Yes and no" OT Goal Formulation: With patient Time For Goal Achievement: 10/13/12 Potential to Achieve Goals: Good ADL Goals Pt Will Perform Grooming: with supervision;sitting;standing Pt Will Perform Upper Body Bathing: with supervision;sitting;standing Pt Will Perform Lower Body Bathing: with supervision;sit to/from stand Pt Will Perform Upper Body Dressing: with  supervision;sitting;standing Pt Will Perform Lower Body Dressing: with supervision;sit to/from stand Pt Will Transfer to Toilet: with supervision;ambulating;regular  height toilet;grab bars Pt Will Perform Toileting - Clothing Manipulation and hygiene: with supervision;sit to/from stand Pt/caregiver will Perform Home Exercise Program: For increased ROM;For increased strengthening;Independently (to her tolerance (suspected MD, her mother has))  Visit Information  Last OT Received On: 09/29/12 Assistance Needed: +1 PT/OT Co-Evaluation/Treatment: Yes History of Present Illness: KRYSLYN HELBIG is an 37 y.o. female with hx of alcohol abuse, still actively drinking, esophageal varices, asthma, anxiety, chronic pancreatitis, presents to the ER after drinking daily about half a gallon of hard liquor with nausea, vomiting and abdominal pain. She has been having chronic abdominal pain but with worsen pain tonight.  She has had no fever, chills, black or bloody stool.  She had one episode of dark vomitus, but mostly clear.  Evaluation in the ER included a lipase of 64, no leukocytosis, and normal Hb, with elevated LFTs including ALT of 614, AST of 116, and alkPHOS 112.  Her UA showed trichomonas and positive for leukocytes.  Hospitalist was asked to admit her for dehydration, chronic pancreatitis, and intractable nausea vomiting.       Prior Functioning     Home Living Family/patient expects to be discharged to:: Private residence Living Arrangements: Alone Available Help at Discharge:  (none) Type of Home: House (but will be homeless as of Aug 1st per pt) Home Equipment: None Additional Comments: pt reports she will loose her house 8/3. She reports "I'll probably find a cheap motel that allows cats." Prior Function Level of Independence: Independent Communication Communication: No difficulties Dominant Hand: Right         Vision/Perception Vision - History Patient Visual Report: No change  from baseline   Cognition  Cognition Arousal/Alertness: Awake/alert Behavior During Therapy: WFL for tasks assessed/performed Overall Cognitive Status: Impaired/Different from baseline Area of Impairment: Safety/judgement Safety/Judgement: Decreased awareness of safety (said she could walk without belt or A--could not safely)    Extremity/Trunk Assessment Upper Extremity Assessment Upper Extremity Assessment: RUE deficits/detail;LUE deficits/detail RUE Deficits / Details: When attempts shoulder flexion she can only get to about 90 degrees and goes into abduction as well as uses whole body to stablize to attempt to lift arm; uses whole arm to get strongest grip RUE Coordination: decreased gross motor LUE Deficits / Details: When attempts shoulder flexion she can only get to about 90 degrees and goes into abduction as well as uses whole body to stablize to attempt to lift arm, uses whole are to get strongest grip LUE Coordination: decreased gross motor Lower Extremity Assessment Lower Extremity Assessment: RLE deficits/detail;LLE deficits/detail RLE Deficits / Details: proximal weakness and foot drop, trace dorsiflex LLE Deficits / Details: proximal weaknes and foot drop, trace dorsiflex Cervical / Trunk Assessment Cervical / Trunk Assessment: Normal     Mobility Bed Mobility Bed Mobility: Supine to Sit;Sit to Supine Supine to Sit: 5: Supervision;With rails;HOB elevated Sit to Supine: 5: Supervision;HOB flat Details for Bed Mobility Assistance: pt uses UEs to to assist LEs back into bed. increased time required Transfers Transfers: Sit to Stand;Stand to Sit Sit to Stand: 4: Min assist;With upper extremity assist;From bed Stand to Sit: 4: Min assist;Without upper extremity assist;To chair/3-in-1 Details for Transfer Assistance: pt very unsteady upon standing with lateral sway and LOB to the Left        Balance Balance Balance Assessed: Yes Static Sitting Balance Static Sitting -  Balance Support: No upper extremity supported Static Sitting - Level of Assistance: 4: Min assist Static Sitting - Comment/# of Minutes: 2 min while washed  hands   End of Session OT - End of Session Equipment Utilized During Treatment: Gait belt;Rolling walker Activity Tolerance: Patient tolerated treatment well Patient left: in bed;with call bell/phone within reach;with bed alarm set    Evette Georges [phone] 09/29/2012, 2:47 PM

## 2012-09-30 DIAGNOSIS — A599 Trichomoniasis, unspecified: Secondary | ICD-10-CM

## 2012-09-30 DIAGNOSIS — E876 Hypokalemia: Secondary | ICD-10-CM

## 2012-09-30 DIAGNOSIS — R5381 Other malaise: Secondary | ICD-10-CM

## 2012-09-30 DIAGNOSIS — E43 Unspecified severe protein-calorie malnutrition: Secondary | ICD-10-CM

## 2012-09-30 LAB — COMPREHENSIVE METABOLIC PANEL
ALT: 69 U/L — ABNORMAL HIGH (ref 0–35)
AST: 263 U/L — ABNORMAL HIGH (ref 0–37)
Albumin: 2.7 g/dL — ABNORMAL LOW (ref 3.5–5.2)
Alkaline Phosphatase: 115 U/L (ref 39–117)
BUN: <3 mg/dL — ABNORMAL LOW (ref 6–23)
CO2: 29 mEq/L (ref 19–32)
Calcium: 9 mg/dL (ref 8.4–10.5)
Chloride: 101 mEq/L (ref 96–112)
Creatinine, Ser: 0.45 mg/dL — ABNORMAL LOW (ref 0.50–1.10)
GFR calc Af Amer: >90 mL/min (ref >90)
GFR calc non Af Amer: >90 mL/min (ref >90)
Glucose, Bld: 146 mg/dL — ABNORMAL HIGH (ref 70–99)
Potassium: 4.4 mEq/L (ref 3.5–5.1)
Sodium: 136 mEq/L (ref 135–145)
Total Bilirubin: 0.9 mg/dL (ref 0.3–1.2)
Total Protein: 6 g/dL (ref 6.0–8.3)

## 2012-09-30 LAB — CBC
HCT: 30.9 % — ABNORMAL LOW (ref 36.0–46.0)
Hemoglobin: 10.3 g/dL — ABNORMAL LOW (ref 12.0–15.0)
MCH: 32.7 pg (ref 26.0–34.0)
MCHC: 33.3 g/dL (ref 30.0–36.0)
MCV: 98.1 fL (ref 78.0–100.0)
Platelets: 156 10*3/uL (ref 150–400)
RBC: 3.15 MIL/uL — ABNORMAL LOW (ref 3.87–5.11)
RDW: 14.6 % (ref 11.5–15.5)
WBC: 3.2 10*3/uL — ABNORMAL LOW (ref 4.0–10.5)

## 2012-09-30 LAB — MAGNESIUM: Magnesium: 1.5 mg/dL (ref 1.5–2.5)

## 2012-09-30 MED ORDER — ONDANSETRON HCL 4 MG PO TABS
4.0000 mg | ORAL_TABLET | Freq: Four times a day (QID) | ORAL | Status: DC | PRN
  Administered 2012-09-30 – 2012-10-02 (×3): 4 mg via ORAL
  Filled 2012-09-30 (×3): qty 1

## 2012-09-30 MED ORDER — OXYCODONE HCL 5 MG PO TABS
5.0000 mg | ORAL_TABLET | ORAL | Status: DC | PRN
  Administered 2012-09-30 – 2012-10-02 (×10): 10 mg via ORAL
  Filled 2012-09-30 (×10): qty 2

## 2012-09-30 MED ORDER — MAGNESIUM OXIDE 400 (241.3 MG) MG PO TABS
400.0000 mg | ORAL_TABLET | Freq: Every day | ORAL | Status: DC
  Administered 2012-09-30 – 2012-10-02 (×3): 400 mg via ORAL
  Filled 2012-09-30 (×3): qty 1

## 2012-09-30 MED ORDER — MORPHINE SULFATE 2 MG/ML IJ SOLN
1.0000 mg | Freq: Four times a day (QID) | INTRAMUSCULAR | Status: DC | PRN
  Administered 2012-09-30 – 2012-10-02 (×4): 1 mg via INTRAVENOUS
  Filled 2012-09-30 (×3): qty 1

## 2012-09-30 NOTE — Progress Notes (Signed)
[facility] PROGRESS NOTE  JESSCIA IMM GNF:[id number] DOB: [birth year] DOA: 09/26/2012 PCP: No primary provider on file.  Assessment/Plan: Principal Problem:   Alcohol abuse Active Problems:   Bipolar 2 disorder   Tobacco abuse   Cocaine abuse   Thrombocytopenia   Trichomonas infection   Dehydration   Protein-calorie malnutrition, severe  Alcohol abuse  -Continue CIWA protocol  -Continue thiamine and folic acid -Counseling provided  Severe hypokalemia hypomagnesemia: Will continue aggressive repletion  Transaminitis: with split pattern consistent with alcohol abuse   Elevated lipase: CT abdomen pelvis does not show any evidence of pancreatitis but shows early cirrhotic changes in the liver (due to alcohol abuse) -LFT pattern consistent with alcohol abuse  -diet advance and will transition pain medications and antiemetics to PO regimen today.   Trichomonas Vaginalis: Treated with 2000 mg of Flagyl one dose.  UTI: urine cx's without significant growth (patient was on abx when culture taken). -currently w/o dysuria. -will finish 3 days of abx treatment for non-complicated UTI   Severe protein calorie malnutrition and Weight loss: due to alcohol consumption and poor diet.  -will follow nutrition service recommendations. -continue feeding supplements  Thrombocytopenia: due to alcohol use. No signs of overt bleeding. Will avoid heparin products.  Physical deconditioning:per PT/OT recommendations will need short SNF rehabilitation at discharge. SW consulted.  DVT: SCD's   Code Status: full  Family Communication:No family at bedside Disposition Plan: PT/OT evaluation has recommended SNF.   Consultants:  None   Procedures:  None  Antibiotics:  Rocephin 7/13-7/16 Flagyl (one dose 2000mg  for trichomonas)  HPI/Subjective:  AAOX3; no agitation on exam. Denies vomiting. Still with nausea. No fever and denies dysuria.  Objective: Filed Vitals:   09/29/12  2035 09/29/12 2300 09/30/12 0436 09/30/12 0859  BP: 120/78  128/91 135/88  Pulse: 90 134 109 83  Temp: 97.8 F (36.6 C)  97.7 F (36.5 C) 98.8 F (37.1 C)  TempSrc: Oral  Oral   Resp: 18  18 17   Height: 5\' 8"  (1.727 m)     Weight: 63.141 kg (139 lb 3.2 oz)     SpO2: 100%  100% 100%    Intake/Output Summary (Last 24 hours) at 09/30/12 0921 Last data filed at 09/30/12 0900  Gross per 24 hour  Intake   1020 ml  Output      0 ml  Net   1020 ml    Exam: Head: Atraumatic.  Nose: Nose normal.  Mouth/Throat: Oropharynx is clear and moist.  Eyes: Conjunctivae are normal. Pupils are equal, round, and reactive to light. No scleral icterus.  Neck: Neck supple. No tracheal deviation present.  Cardiovascular: Normal rate, regular rhythm, normal heart sounds; no rubs or gallops Pulmonary/Chest:CTA bilaterally Abdominal: Soft. No distension; positive BS; mild epigastric/R&L upper quadrant pain. Neurological: She is alert. No sensory deficit; MS 3-4/5 bilaterally (affecting LE mainly); normal finger to nose.   Data Reviewed: Basic Metabolic Panel:  Recent Labs Lab 09/26/12 1525 09/28/12 0700 09/28/12 0845 09/29/12 0430 09/30/12 0545  NA 131* 138  --  137 136  K 4.6 3.8  --  3.1* 4.4  CL 94* 109  --  105 101  CO2 12* 19  --  22 29  GLUCOSE 224* 112*  --  138* 146*  BUN 9 <3*  --  3* <3*  CREATININE 0.52 0.28*  --  0.37* 0.45*  CALCIUM 9.3 8.1*  --  8.5 9.0  MG  --   --  1.1*  --  1.5    Liver Function Tests:  Recent Labs Lab 09/26/12 1525 09/28/12 0700 09/29/12 0430 09/30/12 0545  AST 614* 411* 273* 263*  ALT 116* 93* 71* 69*  ALKPHOS 112 75 86 115  BILITOT 2.0* 1.4* 1.3* 0.9  PROT 8.2 6.1 5.5* 6.0  ALBUMIN 3.6 2.7* 2.5* 2.7*    Recent Labs Lab 09/26/12 1428 09/29/12 1000  LIPASE 64* 37   CBC:  Recent Labs Lab 09/26/12 1428 09/30/12 0545  WBC 5.7 3.2*  NEUTROABS 3.0  --   HGB 14.3 10.3*  HCT 41.6 30.9*  MCV 96.1 98.1  PLT 115* 156     CBG:  Recent Labs Lab 09/27/12 0758 09/27/12 1118 09/27/12 1738 09/27/12 2130 09/28/12 0709  GLUCAP 154* 100* 136* 120* 109*    Recent Results (from the past 240 hour(s))  URINE CULTURE     Status: None   Collection Time    09/26/12  4:30 PM      Result Value Range Status   Specimen Description URINE, CLEAN CATCH   Final   Special Requests NONE   Final   Culture  Setup Time 09/26/2012 23:52   Final   Colony Count 8,000 COLONIES/ML   Final   Culture INSIGNIFICANT GROWTH   Final   Report Status 09/27/2012 FINAL   Final     Studies: Ct Abdomen Pelvis W Contrast  09/27/2012   *RADIOLOGY REPORT*  Clinical Data: Pancreatitis, elevated liver function tests, abdominal pain and alcohol abuse.  CT ABDOMEN AND PELVIS WITH CONTRAST  Technique:  Multidetector CT imaging of the abdomen and pelvis was performed following the standard protocol during bolus administration of intravenous contrast.  Contrast: OMNIPAQUE IOHEXOL 300 MG/ML  SOLN  Comparison: 10/10/2010  Findings: The liver again demonstrates significant and diffuse steatosis.  There is evidence of relative hepatomegaly.  No overt nodular cirrhotic changes or hepatic masses are identified.  Early cirrhosis is suspected based on some progression of periportal atrophic pattern with increased fat seen around the right and left central portal veins.  There is no evidence of biliary ductal dilatation.  The pancreas shows normal enhancement and stable appearance since the prior CT with no evidence of visible acute pancreatitis or mass by CT.  No pancreatic calcifications, abnormal fluid collections or ductal abnormalities are seen.  The spleen is not enlarged.  No obvious varices are seen.  No evidence of ascites.  The adrenal glands and kidneys are within normal limits.  The gallbladder is unremarkable. No vascular abnormalities are identified.  Bowel loops show no evidence of obstruction or inflammation.  The bladder is unremarkable.   Intrauterine device present in the uterus.  No hernias are identified.  No masses or enlarged lymph nodes are seen.  Bony structures are unremarkable.  IMPRESSION:  1.  Significant hepatic steatosis with suspected early central cirrhotic changes. 2.  No evidence of pancreatitis or complications related to pancreatitis by CT.   Original Report Authenticated By: Irish Lack, M.D.    Scheduled Meds: . cefTRIAXone (ROCEPHIN)  IV  1 g Intravenous Q24H  . feeding supplement  1 Container Oral BID BM  . folic acid  1 mg Oral Daily  . LORazepam  0-4 mg Intravenous Q12H  . magnesium oxide  400 mg Oral Daily  . multivitamin with minerals  1 tablet Oral Daily  . pantoprazole  40 mg Oral Daily  . sodium chloride  3 mL Intravenous Q12H  . thiamine  100 mg Oral Daily   Or  .  thiamine  100 mg Intravenous Daily   Continuous Infusions: . dextrose 5 % and 0.9 % NaCl with KCl 20 mEq/L 125 mL/hr at 09/30/12 0211    Principal Problem:   Alcohol abuse Active Problems:   Bipolar 2 disorder   Tobacco abuse   Cocaine abuse   Thrombocytopenia   Trichomonas infection   Dehydration   Protein-calorie malnutrition, severe    Time spent: 35 minutes   Jaclynn Laumann  [facility] Pager [phone]. If 8PM-8AM, please contact night-coverage at [email or web address], password [facility] 09/30/2012, 9:21 AM  LOS: 4 days

## 2012-09-30 NOTE — Progress Notes (Signed)
PT Cancellation Note  Patient Details Name: [name] MRN: [id number] DOB: 1975/05/07   Cancelled Treatment:    Reason Eval/Treat Not Completed: Fatigue/lethargy limiting ability to participate (pt reports that she had some phenergan making her drowsy)   Deloras Reichard 09/30/2012, 11:14 AM

## 2012-09-30 NOTE — Progress Notes (Signed)
Follow-up visit with pt. Pt was lying on her bed and invited me to enter the room. Pt stated that she does not recognize me, and I reintroduced myself and my role to her. Pt stated that she is feeling sad today because she will be sent to a rehabilitation center. Pt stated that she has limited options and was tearful when discussing her current situation. Pt later stated that she does not feel like talking more today. I reassured the pt that it was fine that she needs her own time and space.   Sherol Dade Counselor Intern Haroldine Laws

## 2012-09-30 NOTE — Progress Notes (Signed)
Occupational Therapy Treatment Patient Details Name: [name] MRN: [id number] DOB: Jun 06, 1975 Today's Date: 09/30/2012 Time: 4098-1191 OT Time Calculation (min): 8 min  OT Assessment / Plan / Recommendation  OT comments  Pt needed much encouragement to participate  Follow Up Recommendations  SNF       Equipment Recommendations  None recommended by OT       Frequency Min 2X/week      Plan Discharge plan remains appropriate    Precautions / Restrictions Precautions Precautions: Fall       ADL  Grooming: Performed;Wash/dry face;Wash/dry hands Where Assessed - Grooming: Unsupported standing Toilet Transfer: Research scientist (life sciences) Method: Sit to Barista: Comfort height toilet;Grab bars Where Assessed - Toileting Clothing Manipulation and Hygiene: Standing Transfers/Ambulation Related to ADLs: needed max encouragement       OT Goals(current goals can now be found in the care plan section)    Visit Information  Last OT Received On: 09/30/12 Assistance Needed: +1 History of Present Illness: [name] is an 37 y.o. female with hx of alcohol abuse, still actively drinking, esophageal varices, asthma, anxiety, chronic pancreatitis, presents to the ER after drinking daily about half a gallon of hard liquor with nausea, vomiting and abdominal pain. She has been having chronic abdominal pain but with worsen pain tonight.  She has had no fever, chills, black or bloody stool.  She had one episode of dark vomitus, but mostly clear.   Hospitalist was asked to admit her for dehydration, chronic pancreatitis, and intractable nausea vomiting.       Prior Functioning    Cognition  Cognition Arousal/Alertness: Awake/alert Behavior During Therapy: Flat affect Overall Cognitive Status: Impaired/Different from baseline Area of Impairment: Safety/judgement    Mobility  Bed Mobility Supine to Sit: 6: Modified independent  (Device/Increase time);HOB flat Sit to Supine: 5: Supervision;HOB flat Details for Bed Mobility Assistance: pt uses UEs to to assist LEs back into bed. increased time required Transfers Sit to Stand: With upper extremity assist;From bed;From toilet;4: Min guard Stand to Sit: Without upper extremity assist;To chair/3-in-1;To toilet;4: Min IT consultant Sitting - Balance Support: Left upper extremity supported;Feet supported Static Sitting - Level of Assistance: 5: Stand by assistance Static Sitting - Comment/# of Minutes: 2 minutes while washed hands   End of Session OT - End of Session Equipment Utilized During Treatment: Gait belt;Rolling walker Activity Tolerance: Patient tolerated treatment well Patient left: in bed;with call bell/phone within reach;with bed alarm set  GO     Haeven Nickle, [place] 09/30/2012, 3:54 PM

## 2012-09-30 NOTE — Progress Notes (Signed)
Physical Therapy Treatment Patient Details Name: [name] MRN: [id number] DOB: 1975/05/17 Today's Date: 09/30/2012 Time: 4098-1191 PT Time Calculation (min): 8 min  PT Assessment / Plan / Recommendation  PT Comments   Pt required max verbal encouragement to participate. Pt not interested in pursuing any type of AFO's for her foot drop.  Follow Up Recommendations  SNF     Does the patient have the potential to tolerate intense rehabilitation     Barriers to Discharge        Equipment Recommendations  Rolling walker with 5" wheels    Recommendations for Other Services    Frequency Min 3X/week   Progress towards PT Goals Progress towards PT goals: Not progressing toward goals - comment (self limiting today.)  Plan Frequency needs to be updated    Precautions / Restrictions Precautions Precautions: Fall   Pertinent Vitals/Pain Pt with multiple c/o's of toe pain due to gout and abd pain.    Mobility  Bed Mobility Supine to Sit: 6: Modified independent (Device/Increase time);HOB flat Sit to Supine: 5: Supervision;HOB flat Details for Bed Mobility Assistance: pt uses UEs to to assist LEs back into bed. increased time required Transfers Sit to Stand: With upper extremity assist;From bed;From toilet;4: Min guard Stand to Sit: Without upper extremity assist;To chair/3-in-1;To toilet;4: Min guard Ambulation/Gait Ambulation/Gait Assistance: 4: Min assist Ambulation Distance (Feet): 15 Feet (x 2) Assistive device: Rolling walker Ambulation/Gait Assistance Details: Assist for safety and balance due to foot drop and weakness. Gait Pattern: Decreased dorsiflexion - right;Decreased dorsiflexion - left;Step-through pattern;Decreased stride length;Right steppage;Left steppage    Exercises     PT Diagnosis:    PT Problem List:   PT Treatment Interventions:     PT Goals (current goals can now be found in the care plan section)    Visit Information  Last PT Received On:  09/30/12 Assistance Needed: +1 History of Present Illness: [name] is an 37 y.o. female with hx of alcohol abuse, still actively drinking, esophageal varices, asthma, anxiety, chronic pancreatitis, presents to the ER after drinking daily about half a gallon of hard liquor with nausea, vomiting and abdominal pain. She has been having chronic abdominal pain but with worsen pain tonight.  She has had no fever, chills, black or bloody stool.  She had one episode of dark vomitus, but mostly clear.   Hospitalist was asked to admit her for dehydration, chronic pancreatitis, and intractable nausea vomiting.    Subjective Data      Cognition  Cognition Arousal/Alertness: Awake/alert Behavior During Therapy: Flat affect Overall Cognitive Status: Impaired/Different from baseline Area of Impairment: Safety/judgement    Balance  Static Sitting Balance Static Sitting - Balance Support: Left upper extremity supported;Feet supported Static Sitting - Level of Assistance: 5: Stand by assistance Static Sitting - Comment/# of Minutes: 2 minutes while washed hands  End of Session PT - End of Session Activity Tolerance: Other (comment) (self limiting) Patient left: in bed;with call bell/phone within reach Nurse Communication: Other (comment) (pt c/o's of gout)   GP     Filimon Miranda 09/30/2012, 3:40 PM  [facility] PT [phone]

## 2012-10-01 ENCOUNTER — Inpatient Hospital Stay (HOSPITAL_COMMUNITY): Payer: Medicare Other

## 2012-10-01 ENCOUNTER — Encounter (HOSPITAL_COMMUNITY): Payer: Self-pay | Admitting: Internal Medicine

## 2012-10-01 DIAGNOSIS — F419 Anxiety disorder, unspecified: Secondary | ICD-10-CM

## 2012-10-01 DIAGNOSIS — F32A Depression, unspecified: Secondary | ICD-10-CM

## 2012-10-01 DIAGNOSIS — F1410 Cocaine abuse, uncomplicated: Secondary | ICD-10-CM

## 2012-10-01 LAB — BASIC METABOLIC PANEL
BUN: 3 mg/dL — ABNORMAL LOW (ref 6–23)
CO2: 30 mEq/L (ref 19–32)
Calcium: 10.3 mg/dL (ref 8.4–10.5)
Chloride: 98 mEq/L (ref 96–112)
Creatinine, Ser: 0.49 mg/dL — ABNORMAL LOW (ref 0.50–1.10)
GFR calc Af Amer: >90 mL/min (ref >90)
GFR calc non Af Amer: >90 mL/min (ref >90)
Glucose, Bld: 123 mg/dL — ABNORMAL HIGH (ref 70–99)
Potassium: 4.5 mEq/L (ref 3.5–5.1)
Sodium: 136 mEq/L (ref 135–145)

## 2012-10-01 LAB — PHOSPHORUS: Phosphorus: 3.9 mg/dL (ref 2.3–4.6)

## 2012-10-01 LAB — MAGNESIUM: Magnesium: 1.4 mg/dL — ABNORMAL LOW (ref 1.5–2.5)

## 2012-10-01 MED ORDER — COLCHICINE 0.6 MG PO TABS
0.6000 mg | ORAL_TABLET | Freq: Two times a day (BID) | ORAL | Status: DC
  Administered 2012-10-01 – 2012-10-02 (×3): 0.6 mg via ORAL
  Filled 2012-10-01 (×5): qty 1

## 2012-10-01 MED ORDER — COLCHICINE 0.6 MG PO TABS
1.2000 mg | ORAL_TABLET | Freq: Once | ORAL | Status: AC
  Administered 2012-10-01: 1.2 mg via ORAL
  Filled 2012-10-01: qty 2

## 2012-10-01 MED ORDER — LORAZEPAM 1 MG PO TABS
1.0000 mg | ORAL_TABLET | Freq: Four times a day (QID) | ORAL | Status: DC | PRN
  Administered 2012-10-01 – 2012-10-02 (×3): 1 mg via ORAL
  Filled 2012-10-01: qty 1
  Filled 2012-10-01: qty 2
  Filled 2012-10-01: qty 1

## 2012-10-01 MED ORDER — MAGNESIUM SULFATE 40 MG/ML IJ SOLN
2.0000 g | Freq: Once | INTRAMUSCULAR | Status: AC
  Administered 2012-10-01: 2 g via INTRAVENOUS
  Filled 2012-10-01: qty 50

## 2012-10-01 NOTE — Progress Notes (Signed)
[facility] PROGRESS NOTE  NIMRAT WOOLWORTH ZHY:[id number] DOB: 01-Apr-1975 DOA: 09/26/2012 PCP: No primary provider on file.  Assessment/Plan: Principal Problem:   Alcohol abuse Active Problems:   Bipolar 2 disorder   Tobacco abuse   Cocaine abuse   Thrombocytopenia   Trichomonas infection   Dehydration   Protein-calorie malnutrition, severe  Alcohol abuse, -s/p CIWA protocol  -Continue thiamine and folic acid -Counseling provided - Will give some ativan prn today for agitation - Psychiatry consultation  Gout, now with flare on the second toe of the left foot, a common area for gout flare for the patient -  XR demonstrates some erosion -  Start colchicine  Depression and anxiety, currently untreated.   -  Psychiatry consultation  Severe hypokalemia hypomagnesemia:  Resolved with aggressive repletion.  Transaminitis:  AST:ALT ratio consistent with alcohol abuse   Elevated lipase: CT abdomen pelvis does not show any evidence of pancreatitis but shows early cirrhotic changes in the liver (due to alcohol abuse) - Oral medications today  Trichomonas Vaginalis: s/p 2000 mg of Flagyl one dose.  UTI: urine cx's without significant growth (patient was on abx when culture taken). - s/p 3 days of abx for non-complicated UTI   Severe protein calorie malnutrition and Weight loss: due to alcohol consumption and poor diet.  -will follow nutrition service recommendations. -continue feeding supplements  Thrombocytopenia: due to alcohol use, possible cirrhosis, resolved  Physical deconditioning:per PT/OT recommendations will need [facility] rehabilitation at discharge. SW consulted.  DVT: SCD's   Code Status: full  Family Communication:No family at bedside Disposition Plan:  Await psychiatry consultation then likely going to skilled nursing facility.     Consultants:  Psychiatry   Procedures:  None  Antibiotics:  Rocephin 7/13-7/16 Flagyl (one dose 2000mg  for  trichomonas)  HPI/Subjective:  AAOX3; no agitation on exam. Denies vomiting. Still with nausea. No fever and denies dysuria.  Objective: Filed Vitals:   09/30/12 1635 09/30/12 2035 09/30/12 2100 10/01/12 0610  BP: 125/81 130/85 130/85 114/84  Pulse: 79 94 94 89  Temp: 98.3 F (36.8 C) 97.8 F (36.6 C)  97.9 F (36.6 C)  TempSrc:  Oral  Oral  Resp: 18 18  18   Height:  5\' 8"  (1.727 m)    Weight:  63.141 kg (139 lb 3.2 oz)    SpO2: 99% 100%  100%    Intake/Output Summary (Last 24 hours) at 10/01/12 2952 Last data filed at 10/01/12 0500  Gross per 24 hour  Intake   1200 ml  Output      0 ml  Net   1200 ml    Exam: GEN:  Patient edema, no acute distress HEENT:  PERRL, MMM, mild tongue fasciculations  Cardiovascular: Normal rate, regular rhythm, normal heart sounds; no rubs or gallops Pulmonary/Chest:CTA bilaterally Abdominal: Soft. No distension; positive BS; mild epigastric and left upper quadrant pain without rebound or guarding  Neurological: Grossly intact, mild tremor Psych:  Tearful and appears very anxious   Data Reviewed: Basic Metabolic Panel:  Recent Labs Lab 09/26/12 1525 09/28/12 0700 09/28/12 0845 09/29/12 0430 09/30/12 0545 10/01/12 0635  NA 131* 138  --  137 136 136  K 4.6 3.8  --  3.1* 4.4 4.5  CL 94* 109  --  105 101 98  CO2 12* 19  --  22 29 30   GLUCOSE 224* 112*  --  138* 146* 123*  BUN 9 <3*  --  3* <3* 3*  CREATININE 0.52 0.28*  --  0.37* 0.45* 0.49*  CALCIUM 9.3 8.1*  --  8.5 9.0 10.3  MG  --   --  1.1*  --  1.5 1.4*  PHOS  --   --   --   --   --  3.9    Liver Function Tests:  Recent Labs Lab 09/26/12 1525 09/28/12 0700 09/29/12 0430 09/30/12 0545  AST 614* 411* 273* 263*  ALT 116* 93* 71* 69*  ALKPHOS 112 75 86 115  BILITOT 2.0* 1.4* 1.3* 0.9  PROT 8.2 6.1 5.5* 6.0  ALBUMIN 3.6 2.7* 2.5* 2.7*    Recent Labs Lab 09/26/12 1428 09/29/12 1000  LIPASE 64* 37   CBC:  Recent Labs Lab 09/26/12 1428 09/30/12 0545  WBC  5.7 3.2*  NEUTROABS 3.0  --   HGB 14.3 10.3*  HCT 41.6 30.9*  MCV 96.1 98.1  PLT 115* 156    CBG:  Recent Labs Lab 09/27/12 0758 09/27/12 1118 09/27/12 1738 09/27/12 2130 09/28/12 0709  GLUCAP 154* 100* 136* 120* 109*    Recent Results (from the past 240 hour(s))  URINE CULTURE     Status: None   Collection Time    09/26/12  4:30 PM      Result Value Range Status   Specimen Description URINE, CLEAN CATCH   Final   Special Requests NONE   Final   Culture  Setup Time 09/26/2012 23:52   Final   Colony Count 8,000 COLONIES/ML   Final   Culture INSIGNIFICANT GROWTH   Final   Report Status 09/27/2012 FINAL   Final     Studies: Ct Abdomen Pelvis W Contrast  09/27/2012   *RADIOLOGY REPORT*  Clinical Data: Pancreatitis, elevated liver function tests, abdominal pain and alcohol abuse.  CT ABDOMEN AND PELVIS WITH CONTRAST  Technique:  Multidetector CT imaging of the abdomen and pelvis was performed following the standard protocol during bolus administration of intravenous contrast.  Contrast: OMNIPAQUE IOHEXOL 300 MG/ML  SOLN  Comparison: 10/10/2010  Findings: The liver again demonstrates significant and diffuse steatosis.  There is evidence of relative hepatomegaly.  No overt nodular cirrhotic changes or hepatic masses are identified.  Early cirrhosis is suspected based on some progression of periportal atrophic pattern with increased fat seen around the right and left central portal veins.  There is no evidence of biliary ductal dilatation.  The pancreas shows normal enhancement and stable appearance since the prior CT with no evidence of visible acute pancreatitis or mass by CT.  No pancreatic calcifications, abnormal fluid collections or ductal abnormalities are seen.  The spleen is not enlarged.  No obvious varices are seen.  No evidence of ascites.  The adrenal glands and kidneys are within normal limits.  The gallbladder is unremarkable. No vascular abnormalities are identified.   Bowel loops show no evidence of obstruction or inflammation.  The bladder is unremarkable.  Intrauterine device present in the uterus.  No hernias are identified.  No masses or enlarged lymph nodes are seen.  Bony structures are unremarkable.  IMPRESSION:  1.  Significant hepatic steatosis with suspected early central cirrhotic changes. 2.  No evidence of pancreatitis or complications related to pancreatitis by CT.   Original Report Authenticated By: Irish Lack, M.D.    Scheduled Meds: . cefTRIAXone (ROCEPHIN)  IV  1 g Intravenous Q24H  . colchicine  0.6 mg Oral BID  . colchicine  1.2 mg Oral Once  . feeding supplement  1 Container Oral BID BM  . folic acid  1 mg Oral Daily  . magnesium oxide  400 mg Oral Daily  . magnesium sulfate 1 - 4 g bolus IVPB  2 g Intravenous Once  . multivitamin with minerals  1 tablet Oral Daily  . pantoprazole  40 mg Oral Daily  . sodium chloride  3 mL Intravenous Q12H  . thiamine  100 mg Oral Daily   Or  . thiamine  100 mg Intravenous Daily   Continuous Infusions:    Principal Problem:   Alcohol abuse Active Problems:   Bipolar 2 disorder   Tobacco abuse   Cocaine abuse   Thrombocytopenia   Trichomonas infection   Dehydration   Protein-calorie malnutrition, severe    Time spent: 35 minutes   Jordin Vicencio  [facility] Pager [phone]. If 8PM-8AM, please contact night-coverage at [email or web address], password [facility] 10/01/2012, 9:23 AM  LOS: 5 days

## 2012-10-01 NOTE — Clinical Social Work Psychosocial (Signed)
Clinical Social Work Department BRIEF PSYCHOSOCIAL ASSESSMENT 10/01/2012  Patient:  [name], [name]     Account Number:  [id number]     Admit date:  09/26/2012  Clinical Social Worker:  Delmer Islam  Date/Time:  10/01/2012 06:38 AM  Referred by:  Physician  Date Referred:  09/29/2012 Referred for  SNF Placement   Other Referral:   Interview type:  Patient Other interview type:    PSYCHOSOCIAL DATA Living Status:  FAMILY Admitted from facility:   Level of care:   Primary support name:   Primary support relationship to patient:   Degree of support available:   Patient was living with her grandmother who is now in a faiclty. Patient communicates with her mother but they don't get along most of the time per patient.    CURRENT CONCERNS Current Concerns  Post-Acute Placement   Other Concerns:    SOCIAL WORK ASSESSMENT / PLAN On 09/30/12 CSW talked with patient about discharge planning and recommendation of ST rehab. Patient appeared drowsy, but responded to CSW. Patient in agreement with ST rehab and the process of a facility search was explained. Patient given a SNF list for [facility] to review.   Assessment/plan status:  Psychosocial Support/Ongoing Assessment of Needs Other assessment/ plan:   Information/referral to community resources:   SNF list for [facility] given on 09/30/12    PATIENT'S/FAMILY'S RESPONSE TO PLAN OF CARE: Patient was drowsy but answered CSW's questions and is in agreement with ST rehab.

## 2012-10-01 NOTE — Clinical Social Work Placement (Addendum)
Clinical Social Work Department CLINICAL SOCIAL WORK PLACEMENT NOTE 10/01/2012  Patient:  [name], [name]  Account Number:  [id number] Admit date:  09/26/2012   Discharge date: 10/02/12  Clinical Social Worker:  Genelle Bal, LCSW  Date/time:  10/01/2012 06:43 AM  Clinical Social Work is seeking post-discharge placement for this patient at the following level of care:   SKILLED NURSING   (*CSW will update this form in Epic as items are completed)   09/30/2012  Patient/family provided with [facility] Department of Clinical Social Work's list of facilities offering this level of care within the geographic area requested by the patient (or if unable, by the patient's family).  09/30/2012  Patient/family informed of their freedom to choose among providers that offer the needed level of care, that participate in Medicare, Medicaid or managed care program needed by the patient, have an available bed and are willing to accept the patient.    Patient/family informed of [facility]' ownership interest in [facility], as well as of the fact that they are under no obligation to receive care at this facility.  PASARR submitted to [employer] on 10/01/2012 PASARR number received from [employer] on 10/01/12 - [id number]  FL2 transmitted to all facilities in geographic area requested by pt/family on  09/30/2012 FL2 transmitted to all facilities within larger geographic area on   Patient informed that his/her managed care company has contracts with or will negotiate with  certain facilities, including the following:     Patient/family informed of bed offers received:  10/01/2012 Patient chooses bed at [facility] Physician recommends and patient chooses bed at    Patient to be transferred to [facility] on 10/02/12   Patient to be transferred to facility by ambulance  The following physician request were entered in Epic:   Additional Comments:

## 2012-10-02 DIAGNOSIS — M109 Gout, unspecified: Secondary | ICD-10-CM

## 2012-10-02 MED ORDER — LORAZEPAM 0.5 MG PO TABS: 0.5000 mg | ORAL_TABLET | Freq: Three times a day (TID) | ORAL | Status: DC | PRN

## 2012-10-02 MED ORDER — ADULT MULTIVITAMIN W/MINERALS CH: 1.0000 | ORAL_TABLET | Freq: Every day | ORAL | Status: DC

## 2012-10-02 MED ORDER — HYDROXYZINE HCL 25 MG PO TABS: 25.0000 mg | ORAL_TABLET | Freq: Four times a day (QID) | ORAL | Status: DC | PRN

## 2012-10-02 MED ORDER — PANTOPRAZOLE SODIUM 40 MG PO TBEC: 40.0000 mg | DELAYED_RELEASE_TABLET | Freq: Every day | ORAL | Status: DC

## 2012-10-02 MED ORDER — FLUOXETINE HCL 10 MG PO CAPS
10.0000 mg | ORAL_CAPSULE | Freq: Every day | ORAL | Status: DC
  Administered 2012-10-02: 10 mg via ORAL
  Filled 2012-10-02: qty 1

## 2012-10-02 MED ORDER — FLUOXETINE HCL 20 MG PO CAPS: 20.0000 mg | ORAL_CAPSULE | Freq: Every day | ORAL | Status: DC

## 2012-10-02 MED ORDER — LORAZEPAM 0.5 MG PO TABS: 0.5000 mg | ORAL_TABLET | Freq: Four times a day (QID) | ORAL | Status: DC | PRN

## 2012-10-02 MED ORDER — PREDNISONE 20 MG PO TABS
30.0000 mg | ORAL_TABLET | Freq: Every day | ORAL | Status: DC
  Administered 2012-10-02: 30 mg via ORAL
  Filled 2012-10-02 (×3): qty 1

## 2012-10-02 MED ORDER — PROMETHAZINE HCL 25 MG/ML IJ SOLN: 25.0000 mg | Freq: Four times a day (QID) | INTRAMUSCULAR | Status: DC | PRN

## 2012-10-02 MED ORDER — PROMETHAZINE HCL 25 MG PO TABS: 25.0000 mg | ORAL_TABLET | Freq: Four times a day (QID) | ORAL | Status: DC | PRN

## 2012-10-02 MED ORDER — ARIPIPRAZOLE 2 MG PO TABS: 2.0000 mg | ORAL_TABLET | Freq: Every day | ORAL | Status: DC

## 2012-10-02 MED ORDER — THIAMINE HCL 100 MG PO TABS: 100.0000 mg | ORAL_TABLET | Freq: Every day | ORAL | Status: DC

## 2012-10-02 MED ORDER — ARIPIPRAZOLE 2 MG PO TABS
2.0000 mg | ORAL_TABLET | Freq: Every day | ORAL | Status: DC
  Administered 2012-10-02: 2 mg via ORAL
  Filled 2012-10-02: qty 1

## 2012-10-02 MED ORDER — BOOST / RESOURCE BREEZE PO LIQD: 1.0000 | Freq: Two times a day (BID) | ORAL | Status: DC

## 2012-10-02 MED ORDER — TOPIRAMATE 25 MG PO TABS
25.0000 mg | ORAL_TABLET | Freq: Two times a day (BID) | ORAL | Status: DC
  Administered 2012-10-02: 25 mg via ORAL
  Filled 2012-10-02 (×2): qty 1

## 2012-10-02 MED ORDER — HYDROXYZINE HCL 25 MG PO TABS
25.0000 mg | ORAL_TABLET | Freq: Four times a day (QID) | ORAL | Status: DC | PRN
  Filled 2012-10-02: qty 1

## 2012-10-02 MED ORDER — OXYCODONE HCL 5 MG PO TABS: 5.0000 mg | ORAL_TABLET | ORAL | Status: DC | PRN

## 2012-10-02 MED ORDER — PREDNISONE 10 MG PO TABS: ORAL_TABLET | ORAL | Status: DC

## 2012-10-02 MED ORDER — TOPIRAMATE 25 MG PO TABS: 25.0000 mg | ORAL_TABLET | Freq: Two times a day (BID) | ORAL | Status: DC

## 2012-10-02 MED ORDER — FOLIC ACID 1 MG PO TABS: 1.0000 mg | ORAL_TABLET | Freq: Every day | ORAL | Status: DC

## 2012-10-02 NOTE — Discharge Summary (Addendum)
Physician Discharge Summary  [name] ZOX:[id number] DOB: 05-06-1975 DOA: 09/26/2012  PCP: No primary provider on file.  Admit date: 09/26/2012 Discharge date: 10/02/2012  Recommendations for Outpatient Follow-up:  1. To SNF for ongoing physical and occupational therapy.  2. She should followup with her primary psychiatrist Dr. Betti Cruz within one month.   Followup with primary care doctor within 2 weeks of discharge. Please repeat CBC and CMP to follow up on leukopenia, anemia, thrombocytopenia, transaminitis.  Further testing as indicated.  Please test for GC/Ch and Hepatitis B/C, RPR, and HIV at follow up appointment. 3.  Continue fluoxetine 10mg  daily through 7/18 and increase to 20mg  daily on 7/19. 4.  Prednisone taper 30mg  on 7/18 and 19, 20mg  on 7/20 and 21, 10mg  on 7/22 and 23, then stop.  Discharge Diagnoses:  Principal Problem:   Alcohol abuse Active Problems:   Bipolar 2 disorder   Tobacco abuse   Cocaine abuse   Gout   Thrombocytopenia   Trichomonas infection   Dehydration   Protein-calorie malnutrition, severe   Anxiety and depression   Discharge Condition: Stable, improved  Diet recommendation: Regular  Wt Readings from Last 3 Encounters:  10/01/12 67.541 kg (148 lb 14.4 oz)  11/13/11 77.157 kg (170 lb 1.6 oz)    History of present illness:   [name] is an 37 y.o. female with hx of alcohol abuse, still actively drinking, esophageal varices, asthma, anxiety, chronic pancreatitis, presents to the ER after drinking daily about half a gallon of hard liquor with nausea, vomiting and abdominal pain. She has been having chronic abdominal pain but with worsen pain tonight. She has had no fever, chills, black or bloody stool. She had one episode of dark vomitus, but mostly clear. Evaluation in the ER included a lipase of 64, no leukocytosis, and normal Hb, with elevated LFTs including ALT of 614, AST of 116, and alkPHOS 112. Her UA showed trichomonas and positive  for leukocytes. Hospitalist was asked to admit her for dehydration, chronic pancreatitis, and intractable nausea vomiting.  Hospital Course:   Alcohol abuse. She was started on CIWA protocol and given thiamine and folic acid. She was able to transition to low dose as needed Ativan which we will try to discontinue as an outpatient. She was seen by psychiatry and stated that she did not want to undergo inpatient or outpatient alcohol abuse counseling.    Transaminitis, with elevated AST to ALT ratio consistent with alcohol abuse. Transaminitis slowly improved.  Abdominal pain, lipase was only elevated to 77 and there was no evidence of pancreatitis on CT the abdomen pelvis. Her CT of abdomen and pelvis also did not demonstrate any evidence of bowel inflammation or obstruction. She had a slightly enlarged liver and maybe some early changes of cirrhosis.  She had normal bowel movements and did not have focal RUQ pain suggestive of symptomatic gallstones or Fitz-Hugh-Curtis.  Her pain gradually improved and she was eating and drinking without problems prior to discharge.    Trichomonas vaginalis, status post 2000 mg of Flagyl times one.  Urinary tract infection, culture was drawn after the patient was already started on antibiotics. She received 3 days of empiric antibiotics for non-complicated UTI.   Gout flare of the second toe on the left foot:  Pain and erythema started on 7/15. She was started on colchicine but was discontinued due to some mild increase in abdominal pain. She had intolerance to NSAIDs and therefore was given a [name] course of prednisone to  resolve her flare.  She states that this toe commonly is a location for her gout and there is no associated skin breakdown to suggest a source for cellulitis.  Depression and anxiety, bipolar disorder, she was seen by psychiatry. Psychiatry recommended starting her on Prozac to increase to 20 mg daily in 2 days. Abilify 10 mg once daily, Topamax 25  mg twice a day, Vistaril 25 mg as needed for anxiety. She should followup with her primary psychiatrist soon after discharge.  Severe protein calorie malnutrition and weight loss, is likely due to alcohol consumption. She was seen by nutritional services and was regular diet with dietary supplements.  Leukopenia, possibly due to hemodilution and marrow suppression from EtOH abuse.  Repeat as outpatient.  Consider additional testing, such as for HIV, if persistent.  Thrombocytopenia, possibly due to alcohol use and maybe early cirrhosis. This resolved spontaneously.  She was seen by physical therapy and occupational therapy because of physical deconditioning. They recommended a [name] stay at skilled nursing facility for rehabilitation at the time of discharge.  Consultants:  Psychiatry  Procedures:  None Antibiotics:  Rocephin 7/13-7/16  Flagyl (one dose 2000mg  for trichomonas)   Discharge Exam: Filed Vitals:   10/02/12 0557  BP: 106/75  Pulse: 83  Temp: 98.1 F (36.7 C)  Resp: 20   Filed Vitals:   10/01/12 1430 10/01/12 1800 10/01/12 2031 10/02/12 0557  BP: 116/73 121/81 104/70 106/75  Pulse: 69 96 84 83  Temp:  98 F (36.7 C) 98 F (36.7 C) 98.1 F (36.7 C)  TempSrc:  Oral Oral Oral  Resp: 20 20 18 20   Height:   5\' 8"  (1.727 m)   Weight:   67.541 kg (148 lb 14.4 oz)   SpO2: 100% 100% 99% 98%    GEN: CF, no acute distress  HEENT: PERRL, MMM Cardiovascular: Normal rate, regular rhythm, normal heart sounds; no rubs or gallops  Pulmonary/Chest:CTA bilaterally  Abdominal: NABS, soft, mildly TTP diffusely without rebound or guarding, nondistended. Neurological: Grossly intact Psych:  Less tearful today.   MSK:  2nd toe on left foot erythematous and swollen and warm and tender to touch.   Discharge Instructions      Discharge Orders   Future Orders Complete By Expires     Call MD for:  difficulty breathing, headache or visual disturbances  As directed     Call MD  for:  extreme fatigue  As directed     Call MD for:  hives  As directed     Call MD for:  persistant dizziness or light-headedness  As directed     Call MD for:  persistant nausea and vomiting  As directed     Call MD for:  severe uncontrolled pain  As directed     Call MD for:  temperature >100.4  As directed     Diet general  As directed     Discharge instructions  As directed     Comments:      You were hospitalized with abdominal pain and alcohol withdrawal.  You have successfully completed your alcohol withdrawal stages and we encourage you to stop drinking alcohol permanently.  There are many resources in the area to support you with alcohol abstinence.  You have damaged your liver from alcohol use and have early stages of cirrhosis on your CT scan.  Cirrhosis is generally irreversible, but you may stop this process by not drinking.  You were treated for trichomonas, a sexually transmitted disease.  Please see your gynecologist or your primary care doctor to undergo additional testing for sexually transmitted diseases.  You were seen by psychiatry who recommended starting several medications for anxiety and bipolar.  You will need to get refills of these medications from your primary psychiatrist.  They will need to assess you in about a month to see if any of the doses need to be adjusted and can give you new prescriptions at that time.    Increase activity slowly  As directed         Medication List    STOP taking these medications       NYQUIL COLD & FLU PO      TAKE these medications       albuterol 108 (90 BASE) MCG/ACT inhaler  Commonly known as:  PROVENTIL HFA;VENTOLIN HFA  Inhale 2 puffs into the lungs every 6 (six) hours as needed for wheezing.     ARIPiprazole 2 MG tablet  Commonly known as:  ABILIFY  Take 1 tablet (2 mg total) by mouth daily.     feeding supplement Liqd  Take 1 Container by mouth 2 (two) times daily between meals.     FLUoxetine 20 MG capsule   Commonly known as:  PROZAC  Take 1 capsule (20 mg total) by mouth daily.  Start taking on:  10/04/2012     folic acid 1 MG tablet  Commonly known as:  FOLVITE  Take 1 tablet (1 mg total) by mouth daily.     hydrOXYzine 25 MG tablet  Commonly known as:  ATARAX/VISTARIL  Take 1 tablet (25 mg total) by mouth every 6 (six) hours as needed for anxiety.     LORazepam 0.5 MG tablet  Commonly known as:  ATIVAN  Take 1 tablet (0.5 mg total) by mouth every 8 (eight) hours as needed for anxiety.     multivitamin with minerals Tabs  Take 1 tablet by mouth daily.     oxyCODONE 5 MG immediate release tablet  Commonly known as:  Oxy IR/ROXICODONE  Take 1-2 tablets (5-10 mg total) by mouth every 4 (four) hours as needed.     pantoprazole 40 MG tablet  Commonly known as:  PROTONIX  Take 1 tablet (40 mg total) by mouth daily.     predniSONE 10 MG tablet  Commonly known as:  DELTASONE  Take 3 tabs daily x 2 days, 2 tabs daily x 2 days, 1 tab daily x 2 days, then stop.     promethazine 25 MG tablet  Commonly known as:  PHENERGAN  Take 1 tablet (25 mg total) by mouth every 6 (six) hours as needed for nausea.     thiamine 100 MG tablet  Take 1 tablet (100 mg total) by mouth daily.     topiramate 25 MG tablet  Commonly known as:  TOPAMAX  Take 1 tablet (25 mg total) by mouth 2 (two) times daily.       Follow-up Information   Follow up with Primary care doctor.   Contact information:   You may call [phone] to get assistance in finding a primary care doctor in the area.      Follow up with Marcellina Millin, MD. Schedule an appointment as soon as possible for a visit in 1 month.   Contact information:   [address] [place] [address]        The results of significant diagnostics from this hospitalization (including imaging, microbiology, ancillary and laboratory) are  listed below for reference.    Significant Diagnostic Studies: Ct Abdomen Pelvis  W Contrast  09/27/2012   *RADIOLOGY REPORT*  Clinical Data: Pancreatitis, elevated liver function tests, abdominal pain and alcohol abuse.  CT ABDOMEN AND PELVIS WITH CONTRAST  Technique:  Multidetector CT imaging of the abdomen and pelvis was performed following the standard protocol during bolus administration of intravenous contrast.  Contrast: OMNIPAQUE IOHEXOL 300 MG/ML  SOLN  Comparison: 10/10/2010  Findings: The liver again demonstrates significant and diffuse steatosis.  There is evidence of relative hepatomegaly.  No overt nodular cirrhotic changes or hepatic masses are identified.  Early cirrhosis is suspected based on some progression of periportal atrophic pattern with increased fat seen around the right and left central portal veins.  There is no evidence of biliary ductal dilatation.  The pancreas shows normal enhancement and stable appearance since the prior CT with no evidence of visible acute pancreatitis or mass by CT.  No pancreatic calcifications, abnormal fluid collections or ductal abnormalities are seen.  The spleen is not enlarged.  No obvious varices are seen.  No evidence of ascites.  The adrenal glands and kidneys are within normal limits.  The gallbladder is unremarkable. No vascular abnormalities are identified.  Bowel loops show no evidence of obstruction or inflammation.  The bladder is unremarkable.  Intrauterine device present in the uterus.  No hernias are identified.  No masses or enlarged lymph nodes are seen.  Bony structures are unremarkable.  IMPRESSION:  1.  Significant hepatic steatosis with suspected early central cirrhotic changes. 2.  No evidence of pancreatitis or complications related to pancreatitis by CT.   Original Report Authenticated By: Irish Lack, M.D.   Dg Toe 2nd Left  10/01/2012   *RADIOLOGY REPORT*  Clinical Data: Left second toe pain and swelling.  LEFT SECOND TOE  Comparison: Left foot films on 03/06/2010  Findings: Since prior foot  radiographs, there is some progressive arthropathy noted involving the second MTP joint with some flattening and erosive changes seen involving the metatarsal head. No acute fracture or dislocation is identified.  There is no evidence of bony lesion.  Soft tissues are unremarkable.  IMPRESSION: Some progression of degenerative changes noted at the second MTP joint.   Original Report Authenticated By: Irish Lack, M.D.    Microbiology: Recent Results (from the past 240 hour(s))  URINE CULTURE     Status: None   Collection Time    09/26/12  4:30 PM      Result Value Range Status   Specimen Description URINE, CLEAN CATCH   Final   Special Requests NONE   Final   Culture  Setup Time 09/26/2012 23:52   Final   Colony Count 8,000 COLONIES/ML   Final   Culture INSIGNIFICANT GROWTH   Final   Report Status 09/27/2012 FINAL   Final     Labs: Basic Metabolic Panel:  Recent Labs Lab 09/26/12 1525 09/28/12 0700 09/28/12 0845 09/29/12 0430 09/30/12 0545 10/01/12 0635  NA 131* 138  --  137 136 136  K 4.6 3.8  --  3.1* 4.4 4.5  CL 94* 109  --  105 101 98  CO2 12* 19  --  22 29 30   GLUCOSE 224* 112*  --  138* 146* 123*  BUN 9 <3*  --  3* <3* 3*  CREATININE 0.52 0.28*  --  0.37* 0.45* 0.49*  CALCIUM 9.3 8.1*  --  8.5 9.0 10.3  MG  --   --  1.1*  --  1.5 1.4*  PHOS  --   --   --   --   --  3.9   Liver Function Tests:  Recent Labs Lab 09/26/12 1525 09/28/12 0700 09/29/12 0430 09/30/12 0545  AST 614* 411* 273* 263*  ALT 116* 93* 71* 69*  ALKPHOS 112 75 86 115  BILITOT 2.0* 1.4* 1.3* 0.9  PROT 8.2 6.1 5.5* 6.0  ALBUMIN 3.6 2.7* 2.5* 2.7*    Recent Labs Lab 09/26/12 1428 09/29/12 1000  LIPASE 64* 37   No results found for this basename: AMMONIA,  in the last 168 hours CBC:  Recent Labs Lab 09/26/12 1428 09/30/12 0545  WBC 5.7 3.2*  NEUTROABS 3.0  --   HGB 14.3 10.3*  HCT 41.6 30.9*  MCV 96.1 98.1  PLT 115* 156   Cardiac Enzymes: No results found for this  basename: CKTOTAL, CKMB, CKMBINDEX, TROPONINI,  in the last 168 hours BNP: BNP (last 3 results) No results found for this basename: PROBNP,  in the last 8760 hours CBG:  Recent Labs Lab 09/27/12 0758 09/27/12 1118 09/27/12 1738 09/27/12 2130 09/28/12 0709  GLUCAP 154* 100* 136* 120* 109*    Time coordinating discharge: 45 minutes  Signed:  Madolyn Ackroyd  [facility] 10/02/2012, 11:36 AM

## 2012-10-02 NOTE — Consult Note (Signed)
Reason for Consult: Psychiatric treatment  Referring Physician: Dr. Kathrene Dixon is an 37 y.o. female.  HPI: Patient came to the hospital for pancreatitis issues and muscle weakness.  She has a history of alcohol and cocaine dependency, Bipolar disorder, anxiety, depression, and borderline personality.  [name] is a patient of Dr. Betti Cruz, psychiatry, but has been off medications for the past year; admits to getting street benzodiazepines for her anxiety.  Her last drink was on July 5th, detox not warranted.  She denies depression and suicidal/homicidal ideations, auditory and visual hallucinations.  Her appetite is "good", sleep "poor"--3 hours.  She will be transferred to physical rehab at discharge based on her weakness, according to the patient.  [name] does not want psychiatric inpatient or outpatient care at this time.  She would like to restart her medications:  Prozac, Topamax, Ambien, and Klonopin.  [name] stated she needs an antipsychotic and Haldol had worked for [name] but side effects became too much, would like to try Abilify.  Dr. Lucianne Muss, psychiatrist, addressed this case and collaborated with the treatment plan (below).  Past Medical History  Diagnosis Date  . Anxiety   . Asthma   . Esophageal varices   . Pancreatitis   . Alcohol abuse   . Gout   . Bipolar disorder   . Anxiety and depression     Past Surgical History  Procedure Laterality Date  . Hernia repair      History reviewed. No pertinent family history.  Social History:  reports that she has been smoking.  She does not have any smokeless tobacco history on file. She reports that  drinks alcohol. She reports that she uses illicit drugs (Cocaine).  Allergies:  Allergies  Allergen Reactions  . Erythromycin Anaphylaxis  . Sulfa Antibiotics Anaphylaxis  . Tylenol (Acetaminophen) Other (See Comments)    Pancreatitis and liver dysfunction, not supposed to take med  . Nsaids Other (See Comments)     Causes GI problems, very bad reaction-per patient.     Medications: I have reviewed the patient's current medications.  Results for orders placed during the hospital encounter of 09/26/12 (from the past 48 hour(s))  MAGNESIUM     Status: Abnormal   Collection Time    10/01/12  6:35 AM      Result Value Range   Magnesium 1.4 (*) 1.5 - 2.5 mg/dL  PHOSPHORUS     Status: None   Collection Time    10/01/12  6:35 AM      Result Value Range   Phosphorus 3.9  2.3 - 4.6 mg/dL  BASIC METABOLIC PANEL     Status: Abnormal   Collection Time    10/01/12  6:35 AM      Result Value Range   Sodium 136  135 - 145 mEq/L   Potassium 4.5  3.5 - 5.1 mEq/L   Chloride 98  96 - 112 mEq/L   CO2 30  19 - 32 mEq/L   Glucose, Bld 123 (*) 70 - 99 mg/dL   BUN 3 (*) 6 - 23 mg/dL   Creatinine, Ser 5.78 (*) 0.50 - 1.10 mg/dL   Calcium 46.9  8.4 - 62.9 mg/dL   GFR calc non Af Amer >90  >90 mL/min   GFR calc Af Amer >90  >90 mL/min   Comment:            The eGFR has been calculated     using the CKD EPI equation.  This calculation has not been     validated in all clinical     situations.     eGFR's persistently     <90 mL/min signify     possible Chronic Kidney Disease.    Dg Toe 2nd Left  10/01/2012   *RADIOLOGY REPORT*  Clinical Data: Left second toe pain and swelling.  LEFT SECOND TOE  Comparison: Left foot films on 03/06/2010  Findings: Since prior foot radiographs, there is some progressive arthropathy noted involving the second MTP joint with some flattening and erosive changes seen involving the metatarsal head. No acute fracture or dislocation is identified.  There is no evidence of bony lesion.  Soft tissues are unremarkable.  IMPRESSION: Some progression of degenerative changes noted at the second MTP joint.   Original Report Authenticated By: Irish Lack, M.D.    Review of Systems  HENT: Negative.   Eyes: Negative.   Respiratory: Negative.   Cardiovascular: Negative.    Gastrointestinal: Positive for abdominal pain.  Genitourinary: Negative.   Musculoskeletal: Negative.   Skin: Negative.   Neurological: Positive for weakness.  Endo/Heme/Allergies: Negative.   Psychiatric/Behavioral: The patient is nervous/anxious.    Blood pressure 106/75, pulse 83, temperature 98.1 F (36.7 C), temperature source Oral, resp. rate 20, height 5\' 8"  (1.727 m), weight 67.541 kg (148 lb 14.4 oz), SpO2 98.00%. Physical Exam Completed by primary MD, reviewed  Family History:  No family history on file.  Assessment/Plan:   Mental Status Examination/Evaluation: Patient has a casual appearance, good eye contact, engages easily in conversation. She is calm and cooperative but reports anxiety, affect not congruent.  Denies any active or passive suicidal thoughts or homicidal thoughts.  Her thoughts are organized and goal directed, answers questions appropriately.  There is no paranoia or delusions present at this time.  She denies any auditory or visual hallucination. Her attention and concentration are good.  Her insight and judgment are fair.  DIAGNOSIS:  AXIS I  General Anxiety Disorder, Bipolar disorder, Alcohol Dependency  AXIS II  Borderline Personality   AXIS III  Esophageal varices, asthma, pancreatitis, gout  AXIS IV  other psychosocial or environmental problems, chronic mental and health issues, problems with access to health care services and problems with primary support group   AXIS V  61-70 mild symptoms    Assessment/Plan:  Recommend Prozac 10 mg daily for 2 days then move to 20 mg daily, Abilify 2 mg daily, Topamax 25 mg BID to start, Vistaril 25 mg PRN for anxiety every 6 hours, taper ativan. Patient does not need or desire inpatient psychiatric treatment. Recommend followup treatment with her primary psychiatrist, Dr. Betti Cruz. Contact Child psychotherapist for outpatient discharge planning.  Nanine Means, PMH-NP 10/02/2012, 8:27 AM

## 2012-10-05 NOTE — Consult Note (Signed)
Case discussed, plan formulated by me 

## 2012-11-13 ENCOUNTER — Ambulatory Visit: Payer: Self-pay

## 2013-09-09 ENCOUNTER — Ambulatory Visit: Payer: Self-pay | Admitting: Internal Medicine

## 2013-10-05 ENCOUNTER — Ambulatory Visit: Payer: Self-pay | Admitting: Internal Medicine

## 2014-09-02 DIAGNOSIS — M9903 Segmental and somatic dysfunction of lumbar region: Secondary | ICD-10-CM | POA: Diagnosis not present

## 2014-09-02 DIAGNOSIS — M545 Low back pain: Secondary | ICD-10-CM | POA: Diagnosis not present

## 2014-09-06 DIAGNOSIS — M545 Low back pain: Secondary | ICD-10-CM | POA: Diagnosis not present

## 2014-09-06 DIAGNOSIS — M9903 Segmental and somatic dysfunction of lumbar region: Secondary | ICD-10-CM | POA: Diagnosis not present

## 2014-09-07 DIAGNOSIS — M545 Low back pain: Secondary | ICD-10-CM | POA: Diagnosis not present

## 2014-09-07 DIAGNOSIS — M9903 Segmental and somatic dysfunction of lumbar region: Secondary | ICD-10-CM | POA: Diagnosis not present

## 2014-09-09 DIAGNOSIS — M545 Low back pain: Secondary | ICD-10-CM | POA: Diagnosis not present

## 2014-09-09 DIAGNOSIS — M9903 Segmental and somatic dysfunction of lumbar region: Secondary | ICD-10-CM | POA: Diagnosis not present

## 2014-09-14 DIAGNOSIS — M9903 Segmental and somatic dysfunction of lumbar region: Secondary | ICD-10-CM | POA: Diagnosis not present

## 2014-09-14 DIAGNOSIS — M545 Low back pain: Secondary | ICD-10-CM | POA: Diagnosis not present

## 2014-09-15 DIAGNOSIS — B359 Dermatophytosis, unspecified: Secondary | ICD-10-CM | POA: Diagnosis not present

## 2014-09-27 DIAGNOSIS — M545 Low back pain: Secondary | ICD-10-CM | POA: Diagnosis not present

## 2014-09-27 DIAGNOSIS — M9903 Segmental and somatic dysfunction of lumbar region: Secondary | ICD-10-CM | POA: Diagnosis not present

## 2014-09-30 DIAGNOSIS — M545 Low back pain: Secondary | ICD-10-CM | POA: Diagnosis not present

## 2014-09-30 DIAGNOSIS — M9903 Segmental and somatic dysfunction of lumbar region: Secondary | ICD-10-CM | POA: Diagnosis not present

## 2014-10-11 DIAGNOSIS — R739 Hyperglycemia, unspecified: Secondary | ICD-10-CM | POA: Diagnosis not present

## 2014-10-11 DIAGNOSIS — E785 Hyperlipidemia, unspecified: Secondary | ICD-10-CM | POA: Diagnosis not present

## 2014-10-11 DIAGNOSIS — Z6834 Body mass index (BMI) 34.0-34.9, adult: Secondary | ICD-10-CM | POA: Diagnosis not present

## 2014-10-11 DIAGNOSIS — M109 Gout, unspecified: Secondary | ICD-10-CM | POA: Diagnosis not present

## 2014-10-11 DIAGNOSIS — K219 Gastro-esophageal reflux disease without esophagitis: Secondary | ICD-10-CM | POA: Diagnosis not present

## 2014-10-11 DIAGNOSIS — Z79899 Other long term (current) drug therapy: Secondary | ICD-10-CM | POA: Diagnosis not present

## 2014-10-20 ENCOUNTER — Emergency Department (HOSPITAL_COMMUNITY)
Admission: EM | Admit: 2014-10-20 | Discharge: 2014-10-20 | Disposition: A | Discharge status: Home / Self Care | Payer: Medicare Other | Attending: Emergency Medicine | Admitting: Emergency Medicine

## 2014-10-20 ENCOUNTER — Encounter (HOSPITAL_COMMUNITY): Payer: Self-pay | Admitting: Family Medicine

## 2014-10-20 DIAGNOSIS — F10920 Alcohol use, unspecified with intoxication, uncomplicated: Secondary | ICD-10-CM

## 2014-10-20 DIAGNOSIS — F419 Anxiety disorder, unspecified: Secondary | ICD-10-CM | POA: Diagnosis not present

## 2014-10-20 DIAGNOSIS — F10220 Alcohol dependence with intoxication, uncomplicated: Secondary | ICD-10-CM | POA: Diagnosis not present

## 2014-10-20 DIAGNOSIS — F319 Bipolar disorder, unspecified: Secondary | ICD-10-CM | POA: Diagnosis not present

## 2014-10-20 DIAGNOSIS — F1010 Alcohol abuse, uncomplicated: Secondary | ICD-10-CM | POA: Diagnosis present

## 2014-10-20 DIAGNOSIS — F1110 Opioid abuse, uncomplicated: Secondary | ICD-10-CM | POA: Diagnosis not present

## 2014-10-20 DIAGNOSIS — F1410 Cocaine abuse, uncomplicated: Secondary | ICD-10-CM | POA: Diagnosis not present

## 2014-10-20 DIAGNOSIS — Z79899 Other long term (current) drug therapy: Secondary | ICD-10-CM | POA: Insufficient documentation

## 2014-10-20 DIAGNOSIS — Y907 Blood alcohol level of 200-239 mg/100 ml: Secondary | ICD-10-CM | POA: Diagnosis not present

## 2014-10-20 DIAGNOSIS — Z8719 Personal history of other diseases of the digestive system: Secondary | ICD-10-CM | POA: Diagnosis not present

## 2014-10-20 DIAGNOSIS — J45909 Unspecified asthma, uncomplicated: Secondary | ICD-10-CM | POA: Insufficient documentation

## 2014-10-20 DIAGNOSIS — Z8739 Personal history of other diseases of the musculoskeletal system and connective tissue: Secondary | ICD-10-CM | POA: Diagnosis not present

## 2014-10-20 DIAGNOSIS — F10120 Alcohol abuse with intoxication, uncomplicated: Secondary | ICD-10-CM | POA: Diagnosis not present

## 2014-10-20 DIAGNOSIS — Z720 Tobacco use: Secondary | ICD-10-CM | POA: Insufficient documentation

## 2014-10-20 DIAGNOSIS — F1310 Sedative, hypnotic or anxiolytic abuse, uncomplicated: Secondary | ICD-10-CM | POA: Diagnosis not present

## 2014-10-20 HISTORY — DX: Facioscapulohumeral muscular dystrophy: G71.02

## 2014-10-20 LAB — COMPREHENSIVE METABOLIC PANEL
ALT: 16 U/L (ref 14–54)
AST: 33 U/L (ref 15–41)
Albumin: 3.6 g/dL (ref 3.5–5.0)
Alkaline Phosphatase: 71 U/L (ref 38–126)
Anion gap: 14 (ref 5–15)
BUN: 9 mg/dL (ref 6–20)
CO2: 19 mmol/L — ABNORMAL LOW (ref 22–32)
Calcium: 9.3 mg/dL (ref 8.9–10.3)
Chloride: 107 mmol/L (ref 101–111)
Creatinine, Ser: 0.6 mg/dL (ref 0.44–1.00)
GFR calc Af Amer: >60 mL/min (ref >60)
GFR calc non Af Amer: >60 mL/min (ref >60)
Glucose, Bld: 161 mg/dL — ABNORMAL HIGH (ref 65–99)
Potassium: 3.3 mmol/L — ABNORMAL LOW (ref 3.5–5.1)
Sodium: 140 mmol/L (ref 135–145)
Total Bilirubin: 0.3 mg/dL (ref 0.3–1.2)
Total Protein: 6.9 g/dL (ref 6.5–8.1)

## 2014-10-20 LAB — ETHANOL: Alcohol, Ethyl (B): 220 mg/dL — ABNORMAL HIGH (ref <5)

## 2014-10-20 LAB — SALICYLATE LEVEL: Salicylate Lvl: <4 mg/dL (ref 2.8–30.0)

## 2014-10-20 LAB — CBC
HCT: 40.1 % (ref 36.0–46.0)
Hemoglobin: 13.5 g/dL (ref 12.0–15.0)
MCH: 31.9 pg (ref 26.0–34.0)
MCHC: 33.7 g/dL (ref 30.0–36.0)
MCV: 94.8 fL (ref 78.0–100.0)
Platelets: 256 10*3/uL (ref 150–400)
RBC: 4.23 MIL/uL (ref 3.87–5.11)
RDW: 12.8 % (ref 11.5–15.5)
WBC: 11.1 10*3/uL — ABNORMAL HIGH (ref 4.0–10.5)

## 2014-10-20 LAB — RAPID URINE DRUG SCREEN, HOSP PERFORMED
Amphetamines: NOT DETECTED
Barbiturates: NOT DETECTED
Benzodiazepines: POSITIVE — AB
Cocaine: POSITIVE — AB
Opiates: POSITIVE — AB
Tetrahydrocannabinol: NOT DETECTED

## 2014-10-20 LAB — ACETAMINOPHEN LEVEL: Acetaminophen (Tylenol), Serum: <10 ug/mL — ABNORMAL LOW (ref 10–30)

## 2014-10-20 MED ORDER — CHLORDIAZEPOXIDE HCL 25 MG PO CAPS
ORAL_CAPSULE | ORAL | Status: DC
Start: 2014-10-20 — End: 2014-11-18

## 2014-10-20 NOTE — ED Notes (Signed)
Cart placed at bedside.

## 2014-10-20 NOTE — Discharge Instructions (Signed)
Alcohol Withdrawal °Anytime drug use is interfering with normal living activities it has become abuse. This includes problems with family and friends. Psychological dependence has developed when your mind tells you that the drug is needed. This is usually followed by physical dependence when a continuing increase of drugs are required to get the same feeling or "high." This is known as addiction or chemical dependency. A person's risk is much higher if there is a history of chemical dependency in the family. °Mild Withdrawal Following Stopping Alcohol, When Addiction or Chemical Dependency Has Developed °When a person has developed tolerance to alcohol, any sudden stopping of alcohol can cause uncomfortable physical symptoms. Most of the time these are mild and consist of tremors in the hands and increases in heart rate, breathing, and temperature. Sometimes these symptoms are associated with anxiety, panic attacks, and bad dreams. There may also be stomach upset. Normal sleep patterns are often interrupted with periods of inability to sleep (insomnia). This may last for 6 months. Because of this discomfort, many people choose to continue drinking to get rid of this discomfort and to try to feel normal. °Severe Withdrawal with Decreased or No Alcohol Intake, When Addiction or Chemical Dependency Has Developed °About five percent of alcoholics will develop signs of severe withdrawal when they stop using alcohol. One sign of this is development of generalized seizures (convulsions). Other signs of this are severe agitation and confusion. This may be associated with believing in things which are not real or seeing things which are not really there (delusions and hallucinations). Vitamin deficiencies are usually present if alcohol intake has been long-term. Treatment for this most often requires hospitalization and close observation. °Addiction can only be helped by stopping use of all chemicals. This is hard but may  save your life. With continual alcohol use, possible outcomes are usually loss of self respect and esteem, violence, and death. °Addiction cannot be cured but it can be stopped. This often requires outside help and the care of professionals. Treatment centers are listed in the yellow pages under Cocaine, Narcotics, and Alcoholics Anonymous. Most hospitals and clinics can refer you to a specialized care center. °It is not necessary for you to go through the uncomfortable symptoms of withdrawal. Your caregiver can provide you with medicines that will help you through this difficult period. Try to avoid situations, friends, or drugs that made it possible for you to keep using alcohol in the past. Learn how to say no. °It takes a long period of time to overcome addictions to all drugs, including alcohol. There may be many times when you feel as though you want a drink. After getting rid of the physical addiction and withdrawal, you will have a lessening of the craving which tells you that you need alcohol to feel normal. Call your caregiver if more support is needed. Learn who to talk to in your family and among your friends so that during these periods you can receive outside help. Alcoholics Anonymous (AA) has helped many people over the years. To get further help, contact AA or call your caregiver, counselor, or clergyperson. Al-Anon and Alateen are support groups for friends and family members of an alcoholic. The people who love and care for an alcoholic often need help, too. For information about these organizations, check your phone directory or call a local alcoholism treatment center.  °SEEK IMMEDIATE MEDICAL CARE IF:  °· You have a seizure. °· You have a fever. °· You experience uncontrolled vomiting or you   vomit up blood. This may be bright red or look like black coffee grounds. °· You have blood in the stool. This may be bright red or appear as a black, tarry, bad-smelling stool. °· You become lightheaded or  faint. Do not drive if you feel this way. Have someone else drive you or call 911 for help. °· You become more agitated or confused. °· You develop uncontrolled anxiety. °· You begin to see things that are not really there (hallucinate). °Your caregiver has determined that you completely understand your medical condition, and that your mental state is back to normal. You understand that you have been treated for alcohol withdrawal, have agreed not to drink any alcohol for a minimum of 1 day, will not operate a car or other machinery for 24 hours, and have had an opportunity to ask any questions about your condition. °Document Released: 12/13/2004 Document Revised: 05/28/2011 Document Reviewed: 10/22/2007 °ExitCare® Patient Information ©2015 [employer]. This information is not intended to replace advice given to you by your health care provider. Make sure you discuss any questions you have with your health care provider. ° ° ° °Emergency Department Resource Guide °1) Find a Doctor and Pay Out of Pocket °Although you won't have to find out who is covered by your insurance plan, it is a good idea to ask around and get recommendations. You will then need to call the office and see if the doctor you have chosen will accept you as a new patient and what types of options they offer for patients who are self-pay. Some doctors offer discounts or will set up payment plans for their patients who do not have insurance, but you will need to ask so you aren't surprised when you get to your appointment. ° °2) Contact Your Local Health Department °Not all health departments have doctors that can see patients for sick visits, but many do, so it is worth a call to see if yours does. If you don't know where your local health department is, you can check in your phone book. The CDC also has a tool to help you locate your state's health department, and many state websites also have listings of all of their local health  departments. ° °3) Find a Walk-in Clinic °If your illness is not likely to be very severe or complicated, you may want to try a walk in clinic. These are popping up all over the country in pharmacies, drugstores, and shopping centers. They're usually staffed by nurse practitioners or physician assistants that have been trained to treat common illnesses and complaints. They're usually fairly quick and inexpensive. However, if you have serious medical issues or chronic medical problems, these are probably not your best option. ° °No Primary Care Doctor: °- Call [facility] at  832-8000 - they can help you locate a primary care doctor that  accepts your insurance, provides certain services, etc. °- Physician Referral Service- 1-800-533-3463 ° °Chronic Pain Problems: °Organization         Address  Phone   Notes  °[facility]  (336) 297-2271 Patients need to be referred by their primary care doctor.  ° °Medication Assistance: °Organization         Address  Phone   Notes  °[place] [facility] [address] °[place], [place] [address] (336) 641-8030 --Must be a resident of [place] °-- Must have NO insurance coverage whatsoever (no Medicaid/ Medicare, etc.) °-- The pt. MUST have   a primary care doctor that directs their care regularly and follows them in the community °  °[facility]  (866) 331-1348   °[employer]  (888) 892-1162   ° °Agencies that provide inexpensive medical care: °Organization         Address  Phone   Notes  °[facility]  (336) 832-8035   °[facility]    (336) 832-7272   °[facility] [address] °[place], [place] [address] (336) 832-4777   °[facility] [address], °[place] (336) 271-4999   °[facility]    (336) 373-0678   °[facility]    (336) 272-1050   °[facility] ° [address], [place] Phone:  (336)  832-4444, Fax:  (336) 832-4440 Hours of Operation:  9 am - 6 pm, M-F.  Also accepts Medicaid/Medicare and self-pay.  °[facility] ° [address], [place] Phone: (336) [phone], Fax: (336) [phone]. Hours of Operation:  8:30 am - 5:30 pm, M-F.  Also accepts Medicaid and self-pay.  °[facility] [place] [address], [place] Phone: (336) 878-6027   °[facility] [address], [place], [place] (336)723-1848, Ext. 123 Mondays & Thursdays: 7-9 AM.  First 15 patients are seen on a first come, first serve basis. °  ° °Medicaid-accepting [place] Providers: ° °Organization         Address  Phone   Notes  °[facility] [address], [place] (336) 641-2100 Also accepts self-pay patients.  °[facility] [address], [place] ° (336) 856-9996   °[facility] [address], [place] (336) 288-8857   °[facility] [address] [place] [address], [place] (336) 299-7000   °Veita Bland [address], [place]  ° (336) 373-1557 Only accepts [place] Access Medicaid patients after they have their name applied to their card.  ° °Self-Pay (no insurance) in [place]: ° °Organization         Address  Phone   Notes  °Sickle Cell Patients, [facility] [address], [place] (336) 832-1970   °[facility] [address], [place] (336) 832-4400   °[facility] [place] ° [address], [place] (336) 992-4800   °[facility]/Dr. Osei-Bonsu ° [address] [place] [address], [place] or [address], [place] (336) 841-8500 Phone number for both [place] and [place] locations is the same.  °[facility] [address], [place] (336) 299-0000   °[facility] [address] [place] [address], [place] or [address] (336)  852-7530 °(336) 878-2260   °[facility] [address],  (336) 350-1642, phone; (336) 294-5005, fax Sees patients 1st and 3rd Saturday of every month.  Must not qualify for public or private insurance (i.e. Medicaid, Medicare, [place] Health Choice, Veterans' Benefits) • Household income should be no more than 200% of the poverty level •The clinic cannot treat you if you are pregnant or think you are pregnant • Sexually transmitted diseases are not treated at the clinic.  ° ° °Dental Care: °Organization         Address  Phone  Notes  °[facility] [address],  (  336) 641-6152 Accepts children up to age 21 who are enrolled in Medicaid or [place] Health Choice; pregnant women with a Medicaid card; and children who have applied for Medicaid or [place] Health Choice, but were declined, whose parents can pay a reduced fee at time of service.  °[facility] [place]  [address], [place] (336) 641-7733 Accepts children up to age 21 who are enrolled in Medicaid or [place] Health Choice; pregnant women with a Medicaid card; and children who have applied for Medicaid or [place] Health Choice, but were declined, whose parents can pay a reduced fee at time of service.  °[facility] ° [address], [place] (336) 641-4533 Patients are seen by appointment only. Walk-ins are not accepted. [facility] will see patients 18 years of age and older. °Monday - Tuesday (8am-5pm) °Most Wednesdays (8:30-5pm) °$30 per visit, cash only  °[facility] ° [address], [place] (336) 641-4533 Patients are seen by appointment only. Walk-ins are not accepted. [facility] will see patients 18 years of age and older. °One Wednesday Evening (Monthly: Volunteer Based).  $30 per visit, cash only  °[facility]  (919) 537-3737 for adults;  Children under age 4, call Graduate Pediatric Dentistry at ([phone]) [phone]. Children aged 4-14, please call ([phone]) [phone] to request a pediatric application. ° Dental services are provided in all areas of dental care including fillings, crowns and bridges, complete and partial dentures, implants, gum treatment, root canals, and extractions. Preventive care is also provided. Treatment is provided to both adults and children. °Patients are selected via a lottery and there is often a waiting list. °  °[facility] [address], °[place] ° (336) 763-8833 [email or web address] °  °[facility] [address], [place], [place] (336)723-1848, Ext. 123 Second and Fourth Thursday of each month, opens at 6:30 AM; Clinic ends at 9 AM.  Patients are seen on a first-come first-served basis, and a limited number are seen during each clinic.  ° °[facility] ° [address], [place], [place] (336) 723-7904   Eligibility Requirements °You must have lived in [place], [place], or [place] counties for at least the last three months. °  You cannot be eligible for state or federal sponsored healthcare insurance, including Veterans Administration, Medicaid, or Medicare. °  You generally cannot be eligible for healthcare insurance through your employer.  °  How to apply: °Eligibility screenings are held every Tuesday and Wednesday afternoon from 1:00 pm until 4:00 pm. You do not need an appointment for the interview!  °[facility] [address], [place], [place] 336-631-2330   °[facility]  336-342-8273   °[facility]  336-703-3100   °[facility]  336-570-6415   ° °Behavioral Health Resources in the Community: °Intensive Outpatient Programs °Organization         Address  Phone  Notes  °[facility] [address], [place], [place] 336-878-6098   °[facility] [address], [place], [place] 336-832-9800   °[facility] [address], [place], [place] ° 336-882-2125   °[facility] [address],  °, [place] 1-800-853-5163 or 336-641-4981   °Substance Abuse Resources °Organization         Address  Phone  Notes  °[facility]    336-882-2125   °[facility]  336-784-9470   °[facility]  336-285-9073   °[facility]  336-845-3988   °Residential & Outpatient Substance Abuse Program  1-800-659-3381   °Psychological Services °Organization         Address  Phone  Notes  °[facility]  336- 832-9600   °[facility]  336- 378-7881   °[facility] [address], [place] 1-800-853-5163 or 336-641-4981   ° °Mobile Crisis Teams °Organization         Address  Phone  Notes  °[facility], Mobile Crisis Care Unit  1-877-626-1772   °Assertive °[facility] ° [address] [place], [place] 336-834-9664   °Sharon DeEsch [address] °[place] 336-554-5454   ° °Self-Help/Support Groups °Organization         Address  Phone             Notes  °[facility] - variety of support groups  336- 373-1402 Call for more information  °Narcotics Anonymous (NA), [facility] [address], °[place]  2 meetings at this location  ° °Residential Treatment Programs °Organization         Address  Phone  Notes  °[facility] [address],    °[place]  1-866-801-8205   °[facility] ° [address], [place], [place] 704-293-8524   °[facility] [address], [place] 336-845-3988 Admissions: 8am-3pm M-F  °[facility] [address]    °[place], [place] 336-841-1104   °[facility] [address], [place], [place] 336-379-7146   °[facility] [address]  °[place], [place] 336-285-9073   °[facility] - Intensive  Outpatient [address], [place], [place] 336-852-3033   °[facility] [address]  °[place], [place] 1-877-615-2722 or 336-784-9470   °[facility] [address] [place], [place] 336-227-7417 Accepts Medicaid  °[facility] [address]  °[place] 1-800-659-3381 Substance Abuse/Addiction Treatment  ° °[place] Behavioral Health Resources °Organization         Address  Phone  Notes  °[facility]  (888) 581-9988   °Julie Brannon, PhD [address] [place], [place]   (336) 349-5553 or (336) 951-0000   °[facility]   [address] °[place], [place] (336) 349-4454   °[facility] [address], [place], [place] (336) 342-8316 Insurance/Medicaid/sponsorship through [facility]  °[facility] [address]                                    [place], [place] (336) 342-8316 Therapy/tele-psych/case  °[facility] [address]  ° , [place] (336) 349-2233    °Dr. Arfeen  (336) 349-4544   °[facility] [place]  [employer] [place] [facility] 1) [address],  °2) [address], [place] °3)  [address], [place] (336) 349-3220 °(336) 342-7768 ° °(336) 342-8140   °[place] [facility] (336) 342-1394 or (336) 342-3537 (After Hours)    ° ° ° °
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
## 2014-10-20 NOTE — ED Provider Notes (Signed)
CSN: [id number]     Arrival date & time 10/20/14  1034 History   First MD Initiated Contact with Patient 10/20/14 1115     Chief Complaint  Patient presents with  . Alcohol Problem     (Consider location/radiation/quality/duration/timing/severity/associated sxs/prior Treatment) Patient is a 39 y.o. female presenting with alcohol problem. The history is provided by the patient.  Alcohol Problem This is a chronic problem. The current episode started more than 1 week ago. The problem occurs constantly. The problem has not changed since onset.Pertinent negatives include no shortness of breath. Nothing aggravates the symptoms. Nothing relieves the symptoms. She has tried nothing for the symptoms.    Past Medical History  Diagnosis Date  . Anxiety   . Asthma   . Esophageal varices   . Pancreatitis   . Alcohol abuse   . Gout   . Bipolar disorder   . Anxiety and depression   . [name]    Past Surgical History  Procedure Laterality Date  . Hernia repair     History reviewed. No pertinent family history. History  Substance Use Topics  . Smoking status: Current Every Day Smoker  . Smokeless tobacco: Not on file  . Alcohol Use: Yes     Comment: "half gallon of liquor a day"   OB History    No data available     Review of Systems  Constitutional: Negative for fever.  Respiratory: Negative for cough and shortness of breath.   All other systems reviewed and are negative.     Allergies  Erythromycin; Sulfa antibiotics; Tylenol; and Nsaids  Home Medications   Prior to Admission medications   Medication Sig Start Date End Date Taking? Authorizing Provider  albuterol (PROVENTIL HFA;VENTOLIN HFA) 108 (90 BASE) MCG/ACT inhaler Inhale 2 puffs into the lungs every 6 (six) hours as needed for wheezing.   Yes Historical Provider, MD  ARIPiprazole (ABILIFY) 15 MG tablet Take 15 mg by mouth at bedtime. 10/03/14  Yes Historical Provider, MD   cetirizine (ZYRTEC) 10 MG tablet Take 10-20 mg by mouth 2 (two) times daily.   Yes Historical Provider, MD  clonazePAM (KLONOPIN) 1 MG tablet Take 1 mg by mouth 3 (three) times daily as needed for anxiety.   Yes Historical Provider, MD  diphenhydrAMINE (SOMINEX) 25 MG tablet Take 100 mg by mouth at bedtime as needed for sleep.   Yes Historical Provider, MD  FLUoxetine (PROZAC) 20 MG capsule Take 1 capsule (20 mg total) by mouth daily. Patient taking differently: Take 40 mg by mouth daily.  10/04/12  Yes Renae Fickle, MD  LYRICA 50 MG capsule Take 50 mg by mouth 3 (three) times daily. 10/13/14  Yes Historical Provider, MD  Multiple Vitamin (MULTIVITAMIN WITH MINERALS) TABS Take 1 tablet by mouth daily. 10/02/12  Yes Renae Fickle, MD  pantoprazole (PROTONIX) 40 MG tablet Take 1 tablet (40 mg total) by mouth daily. 10/02/12  Yes Renae Fickle, MD  terbinafine (LAMISIL) 250 MG tablet Take 250 mg by mouth daily.   Yes Historical Provider, MD  topiramate (TOPAMAX) 100 MG tablet Take 100 mg by mouth at bedtime. 10/03/14  Yes Historical Provider, MD  zolpidem (AMBIEN) 10 MG tablet Take 10 mg by mouth at bedtime.   Yes Historical Provider, MD  ARIPiprazole (ABILIFY) 2 MG tablet Take 1 tablet (2 mg total) by mouth daily. Patient not taking: Reported on 10/20/2014 10/02/12   Renae Fickle, MD  feeding supplement (RESOURCE BREEZE) LIQD Take 1 Container by mouth  2 (two) times daily between meals. Patient not taking: Reported on 10/20/2014 10/02/12   Renae Fickle, MD  folic acid (FOLVITE) 1 MG tablet Take 1 tablet (1 mg total) by mouth daily. Patient not taking: Reported on 10/20/2014 10/02/12   Renae Fickle, MD  hydrOXYzine (ATARAX/VISTARIL) 25 MG tablet Take 1 tablet (25 mg total) by mouth every 6 (six) hours as needed for anxiety. Patient not taking: Reported on 10/20/2014 10/02/12   Renae Fickle, MD  LORazepam (ATIVAN) 0.5 MG tablet Take 1 tablet (0.5 mg total) by mouth every 8 (eight) hours as needed  for anxiety. Patient not taking: Reported on 10/20/2014 10/02/12   Renae Fickle, MD  thiamine 100 MG tablet Take 1 tablet (100 mg total) by mouth daily. Patient not taking: Reported on 10/20/2014 10/02/12   Renae Fickle, MD  topiramate (TOPAMAX) 25 MG tablet Take 1 tablet (25 mg total) by mouth 2 (two) times daily. Patient not taking: Reported on 10/20/2014 10/02/12   Renae Fickle, MD   BP 111/70 mmHg  Pulse 123  Temp(Src) 98.5 F (36.9 C) (Oral)  Resp 18  SpO2 95% Physical Exam  Constitutional: She is oriented to person, place, and time. She appears well-developed and well-nourished. No distress.  Intoxicated, mild, but coherent.  HENT:  Head: Normocephalic and atraumatic.  Mouth/Throat: Oropharynx is clear and moist.  Eyes: EOM are normal. Pupils are equal, round, and reactive to light.  Neck: Normal range of motion. Neck supple.  Cardiovascular: Normal rate and regular rhythm.  Exam reveals no friction rub.   No murmur heard. Pulmonary/Chest: Effort normal and breath sounds normal. No respiratory distress. She has no wheezes. She has no rales.  Abdominal: Soft. She exhibits no distension. There is no tenderness. There is no rebound.  Musculoskeletal: Normal range of motion. She exhibits no edema.  Neurological: She is alert and oriented to person, place, and time. A cranial nerve deficit is present. She exhibits normal muscle tone.  Skin: No rash noted. She is not diaphoretic.  Nursing note and vitals reviewed.   ED Course  Procedures (including critical care time) Labs Review Labs Reviewed  COMPREHENSIVE METABOLIC PANEL - Abnormal; Notable for the following:    Potassium 3.3 (*)    CO2 19 (*)    Glucose, Bld 161 (*)    All other components within normal limits  ETHANOL - Abnormal; Notable for the following:    Alcohol, Ethyl (B) 220 (*)    All other components within normal limits  ACETAMINOPHEN LEVEL - Abnormal; Notable for the following:    Acetaminophen (Tylenol),  Serum <10 (*)    All other components within normal limits  CBC - Abnormal; Notable for the following:    WBC 11.1 (*)    All other components within normal limits  SALICYLATE LEVEL  URINE RAPID DRUG SCREEN, HOSP PERFORMED    Imaging Review No results found.   EKG Interpretation None      MDM   Final diagnoses:  Alcohol intoxication, uncomplicated  Alcohol abuse    39 year old female here intoxicated. She has a history of alcohol abuse and self mutilation. She's here today because she wants to cut herself very badly and she was out called detox. Multiple psych admissions previously. No suicidal or homicidal ideation today. She is intoxicated with alcohol level of 220. With her history of self mutilation and desire to hurt herself today, will consult TTS.  Psych felt she was appropriate for discharge. Patient comfortable with this plan. AFVSS here on  re-exam, more clinically sober. She is amenable to librium taper. Stable for discharge.  Elwin Mocha, MD 10/20/14 [phone]

## 2014-10-20 NOTE — ED Notes (Signed)
Staffing notified of sitter needed. security notified of wand,

## 2014-10-20 NOTE — ED Notes (Signed)
Spoke with [facility] states patient does not meet inpatient criteria

## 2014-10-20 NOTE — ED Notes (Signed)
TTS in process. Sitter at bedside. 

## 2014-10-20 NOTE — ED Notes (Signed)
Patient has been wanded sitter at bedside.

## 2014-10-20 NOTE — ED Notes (Signed)
Pt here for alcohol detox. sts that last drink was 20 minutes ago. sts she also smoked some crack last night. Denies SI but sts she is a cutter and wants to cut,.

## 2014-10-20 NOTE — ED Notes (Signed)
Patient made aware urine still needed.

## 2014-10-20 NOTE — ED Notes (Signed)
Patient states last drink was " airplane bottle" patient sates , " I drank before I got here".

## 2014-11-18 ENCOUNTER — Encounter (HOSPITAL_BASED_OUTPATIENT_CLINIC_OR_DEPARTMENT_OTHER): Payer: Self-pay

## 2014-11-18 ENCOUNTER — Emergency Department (HOSPITAL_BASED_OUTPATIENT_CLINIC_OR_DEPARTMENT_OTHER)
Admission: EM | Admit: 2014-11-18 | Discharge: 2014-11-18 | Disposition: A | Discharge status: Home / Self Care | Payer: Medicare Other | Attending: Emergency Medicine | Admitting: Emergency Medicine

## 2014-11-18 ENCOUNTER — Emergency Department (HOSPITAL_BASED_OUTPATIENT_CLINIC_OR_DEPARTMENT_OTHER): Payer: Medicare Other

## 2014-11-18 DIAGNOSIS — Z8739 Personal history of other diseases of the musculoskeletal system and connective tissue: Secondary | ICD-10-CM | POA: Insufficient documentation

## 2014-11-18 DIAGNOSIS — R600 Localized edema: Secondary | ICD-10-CM | POA: Diagnosis not present

## 2014-11-18 DIAGNOSIS — Z8669 Personal history of other diseases of the nervous system and sense organs: Secondary | ICD-10-CM | POA: Diagnosis not present

## 2014-11-18 DIAGNOSIS — Z8719 Personal history of other diseases of the digestive system: Secondary | ICD-10-CM | POA: Insufficient documentation

## 2014-11-18 DIAGNOSIS — Z720 Tobacco use: Secondary | ICD-10-CM | POA: Insufficient documentation

## 2014-11-18 DIAGNOSIS — Z3202 Encounter for pregnancy test, result negative: Secondary | ICD-10-CM | POA: Diagnosis not present

## 2014-11-18 DIAGNOSIS — F419 Anxiety disorder, unspecified: Secondary | ICD-10-CM | POA: Insufficient documentation

## 2014-11-18 DIAGNOSIS — F319 Bipolar disorder, unspecified: Secondary | ICD-10-CM | POA: Insufficient documentation

## 2014-11-18 DIAGNOSIS — Z8679 Personal history of other diseases of the circulatory system: Secondary | ICD-10-CM | POA: Diagnosis not present

## 2014-11-18 DIAGNOSIS — J45909 Unspecified asthma, uncomplicated: Secondary | ICD-10-CM | POA: Diagnosis not present

## 2014-11-18 DIAGNOSIS — R2243 Localized swelling, mass and lump, lower limb, bilateral: Secondary | ICD-10-CM | POA: Diagnosis present

## 2014-11-18 DIAGNOSIS — R609 Edema, unspecified: Secondary | ICD-10-CM | POA: Insufficient documentation

## 2014-11-18 DIAGNOSIS — Z79899 Other long term (current) drug therapy: Secondary | ICD-10-CM | POA: Diagnosis not present

## 2014-11-18 DIAGNOSIS — M7989 Other specified soft tissue disorders: Secondary | ICD-10-CM | POA: Diagnosis not present

## 2014-11-18 LAB — URINE MICROSCOPIC-ADD ON

## 2014-11-18 LAB — COMPREHENSIVE METABOLIC PANEL
ALT: 13 U/L — ABNORMAL LOW (ref 14–54)
AST: 22 U/L (ref 15–41)
Albumin: 3.7 g/dL (ref 3.5–5.0)
Alkaline Phosphatase: 69 U/L (ref 38–126)
Anion gap: 8 (ref 5–15)
BUN: 18 mg/dL (ref 6–20)
CO2: 24 mmol/L (ref 22–32)
Calcium: 9.3 mg/dL (ref 8.9–10.3)
Chloride: 105 mmol/L (ref 101–111)
Creatinine, Ser: 0.73 mg/dL (ref 0.44–1.00)
GFR calc Af Amer: >60 mL/min (ref >60)
GFR calc non Af Amer: >60 mL/min (ref >60)
Glucose, Bld: 198 mg/dL — ABNORMAL HIGH (ref 65–99)
Potassium: 3.9 mmol/L (ref 3.5–5.1)
Sodium: 137 mmol/L (ref 135–145)
Total Bilirubin: 0.3 mg/dL (ref 0.3–1.2)
Total Protein: 7.6 g/dL (ref 6.5–8.1)

## 2014-11-18 LAB — URINALYSIS, ROUTINE W REFLEX MICROSCOPIC
Bilirubin Urine: NEGATIVE
Glucose, UA: NEGATIVE mg/dL
Hgb urine dipstick: NEGATIVE
Ketones, ur: NEGATIVE mg/dL
Nitrite: NEGATIVE
Protein, ur: NEGATIVE mg/dL
Specific Gravity, Urine: 1.019 (ref 1.005–1.030)
Urobilinogen, UA: 0.2 mg/dL (ref 0.0–1.0)
pH: 6 (ref 5.0–8.0)

## 2014-11-18 LAB — CBC WITH DIFFERENTIAL/PLATELET
Basophils Absolute: 0.1 10*3/uL (ref 0.0–0.1)
Basophils Relative: 1 % (ref 0–1)
Eosinophils Absolute: 0.3 10*3/uL (ref 0.0–0.7)
Eosinophils Relative: 3 % (ref 0–5)
HCT: 40 % (ref 36.0–46.0)
Hemoglobin: 12.9 g/dL (ref 12.0–15.0)
Lymphocytes Relative: 30 % (ref 12–46)
Lymphs Abs: 3.1 10*3/uL (ref 0.7–4.0)
MCH: 30.9 pg (ref 26.0–34.0)
MCHC: 32.3 g/dL (ref 30.0–36.0)
MCV: 95.9 fL (ref 78.0–100.0)
Monocytes Absolute: 0.5 10*3/uL (ref 0.1–1.0)
Monocytes Relative: 5 % (ref 3–12)
Neutro Abs: 6.4 10*3/uL (ref 1.7–7.7)
Neutrophils Relative %: 61 % (ref 43–77)
Platelets: 272 10*3/uL (ref 150–400)
RBC: 4.17 MIL/uL (ref 3.87–5.11)
RDW: 12.3 % (ref 11.5–15.5)
WBC: 10.3 10*3/uL (ref 4.0–10.5)

## 2014-11-18 LAB — CBG MONITORING, ED: Glucose-Capillary: 142 mg/dL — ABNORMAL HIGH (ref 65–99)

## 2014-11-18 LAB — PREGNANCY, URINE: Preg Test, Ur: NEGATIVE

## 2014-11-18 LAB — BRAIN NATRIURETIC PEPTIDE: B Natriuretic Peptide: 30 pg/mL (ref 0.0–100.0)

## 2014-11-18 MED ORDER — METRONIDAZOLE 500 MG PO TABS
2000.0000 mg | ORAL_TABLET | Freq: Once | ORAL | Status: AC
  Administered 2014-11-18: 2000 mg via ORAL
  Filled 2014-11-18: qty 4

## 2014-11-18 MED ORDER — FUROSEMIDE 20 MG PO TABS: 20.0000 mg | ORAL_TABLET | Freq: Every day | ORAL | Status: DC

## 2014-11-18 NOTE — ED Notes (Signed)
Pt remains in ultrasound at present. 

## 2014-11-18 NOTE — ED Notes (Signed)
bilat ankle swelling x 3-4 days-[facility] x 23 days for ETOH/cocaine use

## 2014-11-18 NOTE — ED Notes (Signed)
[facility] notified of pt's need for transportation back to facility

## 2014-11-18 NOTE — ED Provider Notes (Signed)
CSN: [id number]     Arrival date & time 11/18/14  1450 History   First MD Initiated Contact with Patient 11/18/14 1518     Chief Complaint  Patient presents with  . Leg Swelling     (Consider location/radiation/quality/duration/timing/severity/associated sxs/prior Treatment) HPI Comments: Patient from [facility] with a four-day history of bilateral feet and ankle swelling. Denies any trauma. She did have a fall several months ago without any injury. She still has abrasions from that. She's been day [facility] for alcohol and cocaine abuse and been clean for 24 days. 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 She denies any pain. She denies any difficulty breathing or chest pain. She denies any falls. She denies any new medications other than Vistaril. Her Klonopin and Ambien were stopped. She reports she's been sitting for prolonged periods of time at day [facility]. No other new medications. No history of heart failure  The history is provided by the patient.    Past Medical History  Diagnosis Date  . Anxiety   . Asthma   . Esophageal varices   . Pancreatitis   . Alcohol abuse   . Gout   . Bipolar disorder   . Anxiety and depression   . [name]    Past Surgical History  Procedure Laterality Date  . Hernia repair     No family history on file. Social History  Substance Use Topics  . Smoking status: Current Every Day Smoker  . Smokeless tobacco: None  . Alcohol Use: No     Comment: in rehab   OB History    No data available     Review of Systems  Constitutional: Negative for fever, activity change and appetite change.  HENT: Negative for congestion.   Eyes: Negative for visual disturbance.  Respiratory: Negative for cough, chest tightness and shortness of breath.   Cardiovascular: Positive for leg swelling. Negative for chest pain and palpitations.  Gastrointestinal: Negative for  nausea, vomiting and abdominal pain.  Genitourinary: Negative for dysuria, hematuria, vaginal bleeding and vaginal discharge.  Musculoskeletal: Negative for myalgias and arthralgias.  Skin: Negative for rash.  Neurological: Negative for dizziness, weakness, light-headedness and headaches.  A complete 10 system review of systems was obtained and all systems are negative except as noted in the HPI and PMH.   A complete 10 system review of systems was obtained and all systems are negative except as noted in the HPI and PMH.     Allergies  Erythromycin; Sulfa antibiotics; Tylenol; and Nsaids  Home Medications   Prior to Admission medications   Medication Sig Start Date End Date Taking? Authorizing Provider  Pantoprazole Sodium (PROTONIX PO) Take by mouth.   Yes Historical Provider, MD  albuterol (PROVENTIL HFA;VENTOLIN HFA) 108 (90 BASE) MCG/ACT inhaler Inhale 2 puffs into the lungs every 6 (six) hours as needed for wheezing.    Historical Provider, MD  ARIPiprazole (ABILIFY) 15 MG tablet Take 15 mg by mouth at bedtime. 10/03/14   Historical Provider, MD  ARIPiprazole (ABILIFY) 2 MG tablet Take 1 tablet (2 mg total) by mouth daily. Patient not taking: Reported on 10/20/2014 10/02/12   Renae Fickle, MD  FLUoxetine (PROZAC) 20 MG capsule Take 1 capsule (20 mg total) by mouth daily. Patient taking differently: Take 40 mg by mouth daily.  10/04/12   Renae Fickle, MD  furosemide (LASIX) 20 MG tablet Take 1 tablet (20 mg total) by mouth  daily. 11/18/14   Glynn Octave, MD  hydrOXYzine (ATARAX/VISTARIL) 25 MG tablet Take 1 tablet (25 mg total) by mouth every 6 (six) hours as needed for anxiety. Patient not taking: Reported on 10/20/2014 10/02/12   Renae Fickle, MD  LYRICA 50 MG capsule Take 50 mg by mouth 3 (three) times daily. 10/13/14   Historical Provider, MD  terbinafine (LAMISIL) 250 MG tablet Take 250 mg by mouth daily.    Historical Provider, MD  topiramate (TOPAMAX) 100 MG tablet Take 100  mg by mouth at bedtime. 10/03/14   Historical Provider, MD   BP 116/79 mmHg  Pulse 80  Temp(Src) 97.9 F (36.6 C) (Oral)  Resp 18  Ht  (1.727 m)  Wt 231 lb (104.781 kg)  BMI 35.13 kg/m2  SpO2 99% Physical Exam  Constitutional: She is oriented to person, place, and time. She appears well-developed and well-nourished. No distress.  HENT:  Head: Normocephalic and atraumatic.  Mouth/Throat: Oropharynx is clear and moist. No oropharyngeal exudate.  Eyes: Conjunctivae and EOM are normal. Pupils are equal, round, and reactive to light.  Neck: Normal range of motion. Neck supple.  No meningismus.  Cardiovascular: Normal rate, regular rhythm, normal heart sounds and intact distal pulses.   No murmur heard. Pulmonary/Chest: Effort normal and breath sounds normal. No respiratory distress.  Abdominal: Soft. There is no tenderness. There is no rebound and no guarding.  Musculoskeletal: Normal range of motion. She exhibits edema. She exhibits no tenderness.  Mild footdrop at baseline. Diffuse swelling of the dorsal feet and ankles bilaterally. Small abrasions overlying distal shins. Intact DP and PT pulses. Intact range of motion of bilateral ankles. No calf swelling or tenderness.  Neurological: She is alert and oriented to person, place, and time. No cranial nerve deficit. She exhibits normal muscle tone. Coordination normal.  No ataxia on finger to nose bilaterally. No pronator drift. 5/5 strength throughout. CN 2-12 intact. Equal grip strength. Sensation intact.   Skin: Skin is warm.  Psychiatric: She has a normal mood and affect. Her behavior is normal.  Nursing note and vitals reviewed.   ED Course  Procedures (including critical care time) Labs Review Labs Reviewed  COMPREHENSIVE METABOLIC PANEL - Abnormal; Notable for the following:    Glucose, Bld 198 (*)    ALT 13 (*)    All other components within normal limits  URINALYSIS, ROUTINE W REFLEX MICROSCOPIC (NOT AT [facility]) -  Abnormal; Notable for the following:    APPearance CLOUDY (*)    Leukocytes, UA MODERATE (*)    All other components within normal limits  URINE MICROSCOPIC-ADD ON - Abnormal; Notable for the following:    Squamous Epithelial / LPF MANY (*)    Bacteria, UA FEW (*)    All other components within normal limits  CBG MONITORING, ED - Abnormal; Notable for the following:    Glucose-Capillary 142 (*)    All other components within normal limits  CBC WITH DIFFERENTIAL/PLATELET  BRAIN NATRIURETIC PEPTIDE  PREGNANCY, URINE    Imaging Review US Venous Img Lower Bilateral  11/18/2014   CLINICAL DATA:  Bilateral lower extremity swelling for 3 days.  EXAM: BILATERAL LOWER EXTREMITY VENOUS DOPPLER ULTRASOUND  TECHNIQUE: Gray-scale sonography with graded compression, as well as color Doppler and duplex ultrasound were performed to evaluate the lower extremity deep venous systems from the level of the common femoral vein and including the common femoral, femoral, profunda femoral, popliteal and calf veins including the posterior tibial, peroneal and gastrocnemius veins when  visible. The superficial great saphenous vein was also interrogated. Spectral Doppler was utilized to evaluate flow at rest and with distal augmentation maneuvers in the common femoral, femoral and popliteal veins.  COMPARISON:  None.  FINDINGS: RIGHT LOWER EXTREMITY  Normal compressibility, augmentation and color Doppler flow in the right common femoral vein, right femoral vein and right popliteal vein. The right saphenofemoral junction is patent. Visualized deep right calf veins are patent. Visualized right GSV is patent.  LEFT LOWER EXTREMITY  Normal compressibility, augmentation and color Doppler flow in the left common femoral vein, left femoral vein and left popliteal vein. The left saphenofemoral junction is patent. Visualized deep left calf veins are patent. Visualized left great saphenous vein is patent.  Other Findings:  None.   IMPRESSION: No evidence of deep venous thrombosis.   Electronically Signed   By: Richarda Overlie M.D.   On: 11/18/2014 17:54   I have personally reviewed and evaluated these images and lab results as part of my medical decision-making.   EKG Interpretation None      MDM   Final diagnoses:  Peripheral edema   Painless ankle swelling 3 days. History of muscular dystrophy. No recent medication changes other than additional Vistaril. No chest pain or shortness of breath.  Labs show hyperglycemia, no history of diabetes. No evidence of DKA. electrolytes are normal. BNP is normal. Urinalysis with Trichomonas.  Patient informed of trichomonas findings and need to refrain from alcohol when taking Flagyl. Discussed her elevated blood sugar.  Doppler negative.  No evidence of CHF. NO CP or SOB.  Suspect peripheral dependent edema.  LIver and kidney function normal.   Blood sugar improved to 142. Will not start metformin at this time. Trichomonas is been treated with Flagyl. We'll start Lasix for symptom relief and follow up with PCP this week for recheck. Return precautions discussed.     Glynn Octave, MD 11/19/14 [phone]

## 2014-11-18 NOTE — ED Notes (Signed)
Patient transported to Ultrasound 

## 2014-11-18 NOTE — Discharge Instructions (Signed)
Peripheral Edema Follow up with your doctor for recheck of your kidney function this week. He also need to have your blood sugar rechecked. Return to the ED if you develop new or worsening symptoms. You have swelling in your legs (peripheral edema). This swelling is due to excess accumulation of salt and water in your body. Edema may be a sign of heart, kidney or liver disease, or a side effect of a medication. It may also be due to problems in the leg veins. Elevating your legs and using special support stockings may be very helpful, if the cause of the swelling is due to poor venous circulation. Avoid long periods of standing, whatever the cause. Treatment of edema depends on identifying the cause. Chips, pretzels, pickles and other salty foods should be avoided. Restricting salt in your diet is almost always needed. Water pills (diuretics) are often used to remove the excess salt and water from your body via urine. These medicines prevent the kidney from reabsorbing sodium. This increases urine flow. Diuretic treatment may also result in lowering of potassium levels in your body. Potassium supplements may be needed if you have to use diuretics daily. Daily weights can help you keep track of your progress in clearing your edema. You should call your caregiver for follow up care as recommended. SEEK IMMEDIATE MEDICAL CARE IF:   You have increased swelling, pain, redness, or heat in your legs.  You develop shortness of breath, especially when lying down.  You develop chest or abdominal pain, weakness, or fainting.  You have a fever. Document Released: 04/12/2004 Document Revised: 05/28/2011 Document Reviewed: 03/23/2009 [facility] Patient Information 2015 [place], [place]. This information is not intended to replace advice given to you by your health care provider. Make sure you discuss any questions you have with your health care provider.

## 2014-11-18 NOTE — ED Notes (Signed)
  CBG 142  

## 2014-12-09 DIAGNOSIS — Z79899 Other long term (current) drug therapy: Secondary | ICD-10-CM | POA: Diagnosis not present

## 2014-12-09 DIAGNOSIS — F1120 Opioid dependence, uncomplicated: Secondary | ICD-10-CM | POA: Diagnosis not present

## 2014-12-09 DIAGNOSIS — F902 Attention-deficit hyperactivity disorder, combined type: Secondary | ICD-10-CM | POA: Diagnosis not present

## 2014-12-09 DIAGNOSIS — F3181 Bipolar II disorder: Secondary | ICD-10-CM | POA: Diagnosis not present

## 2014-12-14 DIAGNOSIS — Z79899 Other long term (current) drug therapy: Secondary | ICD-10-CM | POA: Diagnosis not present

## 2014-12-14 DIAGNOSIS — R600 Localized edema: Secondary | ICD-10-CM | POA: Diagnosis not present

## 2015-01-11 DIAGNOSIS — Z23 Encounter for immunization: Secondary | ICD-10-CM | POA: Diagnosis not present

## 2015-01-14 DIAGNOSIS — R739 Hyperglycemia, unspecified: Secondary | ICD-10-CM | POA: Diagnosis not present

## 2015-01-14 DIAGNOSIS — Z1389 Encounter for screening for other disorder: Secondary | ICD-10-CM | POA: Diagnosis not present

## 2015-01-14 DIAGNOSIS — K219 Gastro-esophageal reflux disease without esophagitis: Secondary | ICD-10-CM | POA: Diagnosis not present

## 2015-01-14 DIAGNOSIS — G629 Polyneuropathy, unspecified: Secondary | ICD-10-CM | POA: Diagnosis not present

## 2015-01-14 DIAGNOSIS — E785 Hyperlipidemia, unspecified: Secondary | ICD-10-CM | POA: Diagnosis not present

## 2015-01-14 DIAGNOSIS — Z79899 Other long term (current) drug therapy: Secondary | ICD-10-CM | POA: Diagnosis not present

## 2015-03-15 DIAGNOSIS — F3181 Bipolar II disorder: Secondary | ICD-10-CM | POA: Diagnosis not present

## 2015-03-15 DIAGNOSIS — F902 Attention-deficit hyperactivity disorder, combined type: Secondary | ICD-10-CM | POA: Diagnosis not present

## 2015-03-15 DIAGNOSIS — Z79899 Other long term (current) drug therapy: Secondary | ICD-10-CM | POA: Diagnosis not present

## 2015-03-15 DIAGNOSIS — F1120 Opioid dependence, uncomplicated: Secondary | ICD-10-CM | POA: Diagnosis not present

## 2015-05-04 DIAGNOSIS — R5383 Other fatigue: Secondary | ICD-10-CM | POA: Diagnosis not present

## 2015-05-04 DIAGNOSIS — Z79899 Other long term (current) drug therapy: Secondary | ICD-10-CM | POA: Diagnosis not present

## 2015-05-04 DIAGNOSIS — M109 Gout, unspecified: Secondary | ICD-10-CM | POA: Diagnosis not present

## 2015-05-04 DIAGNOSIS — E119 Type 2 diabetes mellitus without complications: Secondary | ICD-10-CM | POA: Diagnosis not present

## 2015-05-04 DIAGNOSIS — K219 Gastro-esophageal reflux disease without esophagitis: Secondary | ICD-10-CM | POA: Diagnosis not present

## 2015-05-04 DIAGNOSIS — E785 Hyperlipidemia, unspecified: Secondary | ICD-10-CM | POA: Diagnosis not present

## 2015-08-09 DIAGNOSIS — Z79899 Other long term (current) drug therapy: Secondary | ICD-10-CM | POA: Diagnosis not present

## 2015-08-09 DIAGNOSIS — E119 Type 2 diabetes mellitus without complications: Secondary | ICD-10-CM | POA: Diagnosis not present

## 2015-08-09 DIAGNOSIS — R0602 Shortness of breath: Secondary | ICD-10-CM | POA: Diagnosis not present

## 2015-08-09 DIAGNOSIS — Z6834 Body mass index (BMI) 34.0-34.9, adult: Secondary | ICD-10-CM | POA: Diagnosis not present

## 2015-08-09 DIAGNOSIS — R635 Abnormal weight gain: Secondary | ICD-10-CM | POA: Diagnosis not present

## 2015-08-16 DIAGNOSIS — Z79899 Other long term (current) drug therapy: Secondary | ICD-10-CM | POA: Diagnosis not present

## 2015-08-16 DIAGNOSIS — M109 Gout, unspecified: Secondary | ICD-10-CM | POA: Diagnosis not present

## 2015-08-16 DIAGNOSIS — E119 Type 2 diabetes mellitus without complications: Secondary | ICD-10-CM | POA: Diagnosis not present

## 2015-08-16 DIAGNOSIS — K219 Gastro-esophageal reflux disease without esophagitis: Secondary | ICD-10-CM | POA: Diagnosis not present

## 2015-08-16 DIAGNOSIS — E785 Hyperlipidemia, unspecified: Secondary | ICD-10-CM | POA: Diagnosis not present

## 2015-08-25 DIAGNOSIS — F902 Attention-deficit hyperactivity disorder, combined type: Secondary | ICD-10-CM | POA: Diagnosis not present

## 2015-08-25 DIAGNOSIS — Z79899 Other long term (current) drug therapy: Secondary | ICD-10-CM | POA: Diagnosis not present

## 2015-08-25 DIAGNOSIS — F1120 Opioid dependence, uncomplicated: Secondary | ICD-10-CM | POA: Diagnosis not present

## 2015-08-25 DIAGNOSIS — F3181 Bipolar II disorder: Secondary | ICD-10-CM | POA: Diagnosis not present

## 2015-09-29 DIAGNOSIS — G7100 Muscular dystrophy, unspecified: Secondary | ICD-10-CM | POA: Insufficient documentation

## 2015-09-29 DIAGNOSIS — M1A0790 Idiopathic chronic gout, unspecified ankle and foot, without tophus (tophi): Secondary | ICD-10-CM | POA: Insufficient documentation

## 2015-09-29 DIAGNOSIS — Z3009 Encounter for other general counseling and advice on contraception: Secondary | ICD-10-CM | POA: Diagnosis not present

## 2015-09-29 DIAGNOSIS — E119 Type 2 diabetes mellitus without complications: Secondary | ICD-10-CM | POA: Insufficient documentation

## 2015-09-29 DIAGNOSIS — Z30430 Encounter for insertion of intrauterine contraceptive device: Secondary | ICD-10-CM | POA: Diagnosis not present

## 2015-09-29 DIAGNOSIS — Z975 Presence of (intrauterine) contraceptive device: Secondary | ICD-10-CM | POA: Diagnosis not present

## 2015-10-26 DIAGNOSIS — R635 Abnormal weight gain: Secondary | ICD-10-CM | POA: Diagnosis not present

## 2015-10-26 DIAGNOSIS — Z79899 Other long term (current) drug therapy: Secondary | ICD-10-CM | POA: Diagnosis not present

## 2015-10-26 DIAGNOSIS — Z6834 Body mass index (BMI) 34.0-34.9, adult: Secondary | ICD-10-CM | POA: Diagnosis not present

## 2015-10-26 DIAGNOSIS — G629 Polyneuropathy, unspecified: Secondary | ICD-10-CM | POA: Diagnosis not present

## 2015-11-16 DIAGNOSIS — Z87891 Personal history of nicotine dependence: Secondary | ICD-10-CM | POA: Diagnosis not present

## 2015-11-16 DIAGNOSIS — N3001 Acute cystitis with hematuria: Secondary | ICD-10-CM | POA: Diagnosis not present

## 2015-11-16 DIAGNOSIS — G710 Muscular dystrophy: Secondary | ICD-10-CM | POA: Diagnosis not present

## 2016-01-10 DIAGNOSIS — M21371 Foot drop, right foot: Secondary | ICD-10-CM | POA: Diagnosis not present

## 2016-01-10 DIAGNOSIS — E785 Hyperlipidemia, unspecified: Secondary | ICD-10-CM | POA: Diagnosis not present

## 2016-01-10 DIAGNOSIS — G629 Polyneuropathy, unspecified: Secondary | ICD-10-CM | POA: Diagnosis not present

## 2016-01-10 DIAGNOSIS — Z23 Encounter for immunization: Secondary | ICD-10-CM | POA: Diagnosis not present

## 2016-01-10 DIAGNOSIS — E119 Type 2 diabetes mellitus without complications: Secondary | ICD-10-CM | POA: Diagnosis not present

## 2016-02-15 DIAGNOSIS — M109 Gout, unspecified: Secondary | ICD-10-CM | POA: Diagnosis not present

## 2016-02-15 DIAGNOSIS — K219 Gastro-esophageal reflux disease without esophagitis: Secondary | ICD-10-CM | POA: Diagnosis not present

## 2016-02-15 DIAGNOSIS — R350 Frequency of micturition: Secondary | ICD-10-CM | POA: Diagnosis not present

## 2016-02-15 DIAGNOSIS — E119 Type 2 diabetes mellitus without complications: Secondary | ICD-10-CM | POA: Diagnosis not present

## 2016-02-15 DIAGNOSIS — Z79899 Other long term (current) drug therapy: Secondary | ICD-10-CM | POA: Diagnosis not present

## 2016-02-15 DIAGNOSIS — E785 Hyperlipidemia, unspecified: Secondary | ICD-10-CM | POA: Diagnosis not present

## 2016-02-15 DIAGNOSIS — Z1389 Encounter for screening for other disorder: Secondary | ICD-10-CM | POA: Diagnosis not present

## 2016-04-12 DIAGNOSIS — Z79899 Other long term (current) drug therapy: Secondary | ICD-10-CM | POA: Diagnosis not present

## 2016-06-02 DIAGNOSIS — F17200 Nicotine dependence, unspecified, uncomplicated: Secondary | ICD-10-CM | POA: Diagnosis not present

## 2016-06-02 DIAGNOSIS — Y939 Activity, unspecified: Secondary | ICD-10-CM | POA: Diagnosis not present

## 2016-06-02 DIAGNOSIS — Z5181 Encounter for therapeutic drug level monitoring: Secondary | ICD-10-CM | POA: Diagnosis not present

## 2016-06-02 DIAGNOSIS — F329 Major depressive disorder, single episode, unspecified: Secondary | ICD-10-CM | POA: Diagnosis not present

## 2016-06-02 DIAGNOSIS — X788XXA Intentional self-harm by other sharp object, initial encounter: Secondary | ICD-10-CM | POA: Diagnosis not present

## 2016-06-02 DIAGNOSIS — Y929 Unspecified place or not applicable: Secondary | ICD-10-CM | POA: Diagnosis not present

## 2016-06-02 DIAGNOSIS — Y999 Unspecified external cause status: Secondary | ICD-10-CM | POA: Diagnosis not present

## 2016-06-02 DIAGNOSIS — S4992XA Unspecified injury of left shoulder and upper arm, initial encounter: Secondary | ICD-10-CM | POA: Diagnosis present

## 2016-06-02 DIAGNOSIS — S40812A Abrasion of left upper arm, initial encounter: Secondary | ICD-10-CM | POA: Insufficient documentation

## 2016-06-03 ENCOUNTER — Emergency Department
Admission: EM | Admit: 2016-06-03 | Discharge: 2016-06-03 | Disposition: A | Discharge status: Home / Self Care | Payer: Medicare Other | Attending: Emergency Medicine | Admitting: Emergency Medicine

## 2016-06-03 ENCOUNTER — Encounter: Payer: Self-pay | Admitting: Emergency Medicine

## 2016-06-03 DIAGNOSIS — T148XXA Other injury of unspecified body region, initial encounter: Secondary | ICD-10-CM

## 2016-06-03 DIAGNOSIS — S40812A Abrasion of left upper arm, initial encounter: Secondary | ICD-10-CM | POA: Diagnosis not present

## 2016-06-03 DIAGNOSIS — F329 Major depressive disorder, single episode, unspecified: Secondary | ICD-10-CM

## 2016-06-03 DIAGNOSIS — F32A Depression, unspecified: Secondary | ICD-10-CM

## 2016-06-03 LAB — URINE DRUG SCREEN, QUALITATIVE (ARMC ONLY)
Amphetamines, Ur Screen: NOT DETECTED
Barbiturates, Ur Screen: NOT DETECTED
Benzodiazepine, Ur Scrn: NOT DETECTED
Cannabinoid 50 Ng, Ur ~~LOC~~: NOT DETECTED
Cocaine Metabolite,Ur ~~LOC~~: NOT DETECTED
MDMA (Ecstasy)Ur Screen: NOT DETECTED
Methadone Scn, Ur: NOT DETECTED
Opiate, Ur Screen: NOT DETECTED
Phencyclidine (PCP) Ur S: NOT DETECTED
Tricyclic, Ur Screen: NOT DETECTED

## 2016-06-03 LAB — COMPREHENSIVE METABOLIC PANEL
ALT: 9 U/L — ABNORMAL LOW (ref 14–54)
AST: 21 U/L (ref 15–41)
Albumin: 3.9 g/dL (ref 3.5–5.0)
Alkaline Phosphatase: 85 U/L (ref 38–126)
Anion gap: 10 (ref 5–15)
BUN: 13 mg/dL (ref 6–20)
CO2: 21 mmol/L — ABNORMAL LOW (ref 22–32)
Calcium: 8.5 mg/dL — ABNORMAL LOW (ref 8.9–10.3)
Chloride: 111 mmol/L (ref 101–111)
Creatinine, Ser: 0.61 mg/dL (ref 0.44–1.00)
GFR calc Af Amer: >60 mL/min (ref >60)
GFR calc non Af Amer: >60 mL/min (ref >60)
Glucose, Bld: 128 mg/dL — ABNORMAL HIGH (ref 65–99)
Potassium: 4.1 mmol/L (ref 3.5–5.1)
Sodium: 142 mmol/L (ref 135–145)
Total Bilirubin: 0.3 mg/dL (ref 0.3–1.2)
Total Protein: 7.8 g/dL (ref 6.5–8.1)

## 2016-06-03 LAB — CBC
HCT: 39.1 % (ref 35.0–47.0)
Hemoglobin: 13.5 g/dL (ref 12.0–16.0)
MCH: 31.9 pg (ref 26.0–34.0)
MCHC: 34.6 g/dL (ref 32.0–36.0)
MCV: 92.1 fL (ref 80.0–100.0)
Platelets: 236 10*3/uL (ref 150–440)
RBC: 4.24 MIL/uL (ref 3.80–5.20)
RDW: 14 % (ref 11.5–14.5)
WBC: 9.1 10*3/uL (ref 3.6–11.0)

## 2016-06-03 LAB — ETHANOL: Alcohol, Ethyl (B): 162 mg/dL — ABNORMAL HIGH (ref <5)

## 2016-06-03 MED ORDER — TETANUS-DIPHTH-ACELL PERTUSSIS 5-2.5-18.5 LF-MCG/0.5 IM SUSP
0.5000 mL | Freq: Once | INTRAMUSCULAR | Status: AC
  Administered 2016-06-03: 0.5 mL via INTRAMUSCULAR
  Filled 2016-06-03: qty 0.5

## 2016-06-03 NOTE — B[facility] Counselor (Signed)
Patient is here voluntary due to depression and alcohol intoxication.  Patient also has a hx of cutting and has superficial cuts on her forearm.  Pt denies suicidal and homicidal ideations.  She is requesting to be discharged, stating that she has an appointment Thursday, 06/07/16 with her psychiatrist.  Patient will be discharged and will wait for her friend to pick her up.

## 2016-06-03 NOTE — ED Provider Notes (Signed)
[facility] Emergency Department Provider Note  ____________________________________________   First MD Initiated Contact with Patient 06/03/16 0121     (approximate)  I have reviewed the triage vital signs and the nursing notes.   HISTORY  Chief Complaint Behavior Problem    HPI [name] is a 41 y.o. female who comes to the emergency department requesting help. She has been depressed recently secondary to relationship issues and tonight she was feeling down so she cut her left wrist. She said she did not do it because she wanted to die but because it helps relieve her emotional pain. She is a long-standing history of cutting. Not wanted to die and she does not want to hurt herself. She does have a psychiatrist and she has no appointment to see him on March 22. She denies chest pain or shortness of breath. She denies abdominal pain nausea or vomiting. She denies ingestion. By the time she got to the emergency department and saw me she said that she had no real complaints and would like to go home.   Past Medical History:  Diagnosis Date  . Alcohol abuse   . Anxiety   . Anxiety and depression   . Asthma   . Bipolar disorder ([name])   . Esophageal varices ([name])   . 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[name])   . Gout   . Pancreatitis     Patient Active Problem List   Diagnosis Date Noted  . Anxiety and depression 10/01/2012  . Protein-calorie malnutrition, severe ([name]) 09/30/2012  . Alcohol abuse 09/26/2012  . Trichomonas infection 09/26/2012  . Dehydration 09/26/2012  . Hypokalemia 11/14/2011  . Thrombocytopenia ([name]) 11/14/2011  . Alcohol withdrawal ([name]) 11/13/2011  . Bipolar 2 disorder ([name]) 11/13/2011  . Tobacco abuse 11/13/2011  . Cocaine abuse 11/13/2011  . Gout 11/13/2011    Past Surgical History:  Procedure Laterality Date  . HERNIA REPAIR      Prior to Admission medications   Medication Sig Start Date End Date  Taking? Authorizing Provider  albuterol (PROVENTIL HFA;VENTOLIN HFA) 108 (90 BASE) MCG/ACT inhaler Inhale 2 puffs into the lungs every 6 (six) hours as needed for wheezing.    Historical Provider, MD  ARIPiprazole (ABILIFY) 15 MG tablet Take 15 mg by mouth at bedtime. 10/03/14   Historical Provider, MD  ARIPiprazole (ABILIFY) 2 MG tablet Take 1 tablet (2 mg total) by mouth daily. Patient not taking: Reported on 10/20/2014 10/02/12   Renae FickleMackenzie Short, MD  FLUoxetine (PROZAC) 20 MG capsule Take 1 capsule (20 mg total) by mouth daily. Patient taking differently: Take 40 mg by mouth daily.  10/04/12   Renae FickleMackenzie Short, MD  furosemide (LASIX) 20 MG tablet Take 1 tablet (20 mg total) by mouth daily. 11/18/14   Glynn OctaveStephen Rancour, MD  hydrOXYzine (ATARAX/VISTARIL) 25 MG tablet Take 1 tablet (25 mg total) by mouth every 6 (six) hours as needed for anxiety. Patient not taking: Reported on 10/20/2014 10/02/12   Renae FickleMackenzie Short, MD  LYRICA 50 MG capsule Take 50 mg by mouth 3 (three) times daily. 10/13/14   Historical Provider, MD  Pantoprazole Sodium (PROTONIX PO) Take by mouth.    Historical Provider, MD  terbinafine (LAMISIL) 250 MG tablet Take 250 mg by mouth daily.    Historical Provider, MD  topiramate (TOPAMAX) 100 MG tablet Take 100 mg by mouth at bedtime. 10/03/14   Historical Provider, MD    Allergies Erythromycin; Sulfa antibiotics; Tylenol [acetaminophen]; and Nsaids  No family history on  file.  Social History Social History  Substance Use Topics  . Smoking status: Current Every Day Smoker    Packs/day: 0.50  . Smokeless tobacco: Never Used  . Alcohol use Yes     Comment: in rehab    Review of Systems Constitutional: No fever/chills Eyes: No visual changes. ENT: No sore throat. Cardiovascular: Denies chest pain. Respiratory: Denies shortness of breath. Gastrointestinal: No abdominal pain.  No nausea, no vomiting.  No diarrhea.  No constipation. Genitourinary: Negative for  dysuria. Musculoskeletal: Negative for back pain. Skin: Negative for rash. Neurological: Negative for headaches, focal weakness or numbness.  10-point ROS otherwise negative.  ____________________________________________   PHYSICAL EXAM:  VITAL SIGNS: ED Triage Vitals  Enc Vitals Group     BP 06/03/16 0015 119/79     Pulse Rate 06/03/16 0015 (!) 110     Resp 06/03/16 0015 18     Temp 06/03/16 0015 98.7 F (37.1 C)     Temp Source 06/03/16 0015 Oral     SpO2 06/03/16 0015 98 %     Weight 06/03/16 0017 219 lb 8 oz (99.6 kg)     Height 06/03/16 0017 5\' 7"  (1.702 m)     Head Circumference --      Peak Flow --      Pain Score 06/03/16 0046 6     Pain Loc --      Pain Edu? --      Excl. in GC? --     Constitutional: Alert and oriented x 4 well appearing nontoxic no diaphoresis speaks in full, clear sentences Eyes: PERRL EOMI. Head: Atraumatic. Nose: No congestion/rhinnorhea. Mouth/Throat: No trismus Neck: No stridor.   Cardiovascular: Tachycardic rate, regular rhythm. Grossly normal heart sounds.  Good peripheral circulation. Respiratory: Normal respiratory effort.  No retractions. Lungs CTAB and moving good air Gastrointestinal: Soft nondistended nontender no rebound no guarding no peritonitis no McBurney's tenderness negative Rovsing's no costovertebral tenderness negative Murphy's Musculoskeletal: Multiple very superficial abrasions to her left arm she is neurovascularly intact  No tenderness over distal radius or distal ulna. No tenderness over snuffbox and no axial load discomfort Sensation intact to light touch over first dorsal webspace, distal index finger, distal small finger Can flex and oppose  thumb, cross 2 on 3, and extend wrist 2+ radial pulse and less than 2 second capillary refill Compartments are soft  Neurologic:  Normal speech and language. No gross focal neurologic deficits are appreciated. Skin:  Skin is warm, dry and intact. No rash  noted. Psychiatric: Somewhat sad affect    ____________________________________________   ____________   LABS (all labs ordered are listed, but only abnormal results are displayed)  Labs Reviewed  COMPREHENSIVE METABOLIC PANEL - Abnormal; Notable for the following:       Result Value   CO2 21 (*)    Glucose, Bld 128 (*)    Calcium 8.5 (*)    ALT 9 (*)    All other components within normal limits  ETHANOL - Abnormal; Notable for the following:    Alcohol, Ethyl (B) 162 (*)    All other components within normal limits  CBC  URINE DRUG SCREEN, QUALITATIVE ([facility] ONLY)    Slightly elevated ethanol level __________________________________________  EKG   ____________________________________________  RADIOLOGY   ____________________________________________   PROCEDURES  Procedure(s) performed: no  Procedures  Critical Care performed: no  ____________________________________________   INITIAL IMPRESSION / ASSESSMENT AND PLAN / ED COURSE  Pertinent labs & imaging results that were available  during my care of the patient were reviewed by me and considered in my medical decision making (see chart for details).       ----------------------------------------- 1:42 AM on 06/03/2016 -----------------------------------------  The patient is not suicidal and she contracts for safety. She has follow-up with her psychiatrist in 4 days. She said she initially came to the emergency department tonight out of despair requesting to speak to a psychiatrist, however does not want to speak to a telemetry psychiatrist. Her tetanus is updated and she is discharged home in good condition. ____________________________________________   FINAL CLINICAL IMPRESSION(S) / ED DIAGNOSES  Final diagnoses:  Depression, unspecified depression type  Abrasion      NEW MEDICATIONS STARTED DURING THIS VISIT:  Discharge Medication List as of 06/03/2016  1:44 AM       Note:  This  document was prepared using Dragon voice recognition software and may include unintentional dictation errors.     Merrily Brittle, MD 06/03/16 [phone]

## 2016-06-03 NOTE — ED Triage Notes (Signed)
Pt states that she wants help with the loneliness. Pt has multiple superficial cuts to the left arm. Pt states that "I have lost everyone in my life, except for my family". Pt is here for help in order to find different coping mechanisms. Pt denies thoughts of SI or HI at this time.

## 2016-06-03 NOTE — ED Notes (Addendum)
Pt changed into scrubs and personal belongings given to cousin Dawn to take home

## 2016-06-03 NOTE — Discharge Instructions (Signed)
Please return to the emergency department for any new or worsening symptoms such as fevers, chills, if you want to hurt herself again, or for any other concerns.  It was a pleasure to take care of you today, and thank you for coming to our emergency department.  If you have any questions or concerns before leaving please ask the nurse to grab me and I'm more than happy to go through your aftercare instructions again.  If you were prescribed any opioid pain medication today such as Norco, Vicodin, Percocet, morphine, hydrocodone, or oxycodone please make sure you do not drive when you are taking this medication as it can alter your ability to drive safely.  If you have any concerns once you are home that you are not improving or are in fact getting worse before you can make it to your follow-up appointment, please do not hesitate to call 911 and come back for further evaluation.  Merrily BrittleNeil Jaikob Borgwardt MD  Results for orders placed or performed during the hospital encounter of 06/03/16  Comprehensive metabolic panel  Result Value Ref Range   Sodium 142 135 - 145 mmol/L   Potassium 4.1 3.5 - 5.1 mmol/L   Chloride 111 101 - 111 mmol/L   CO2 21 (L) 22 - 32 mmol/L   Glucose, Bld 128 (H) 65 - 99 mg/dL   BUN 13 6 - 20 mg/dL   Creatinine, Ser 1.610.61 0.44 - 1.00 mg/dL   Calcium 8.5 (L) 8.9 - 10.3 mg/dL   Total Protein 7.8 6.5 - 8.1 g/dL   Albumin 3.9 3.5 - 5.0 g/dL   AST 21 15 - 41 U/L   ALT 9 (L) 14 - 54 U/L   Alkaline Phosphatase 85 38 - 126 U/L   Total Bilirubin 0.3 0.3 - 1.2 mg/dL   GFR calc non Af Amer >60 >60 mL/min   GFR calc Af Amer >60 >60 mL/min   Anion gap 10 5 - 15  Ethanol  Result Value Ref Range   Alcohol, Ethyl (B) 162 (H) <5 mg/dL  cbc  Result Value Ref Range   WBC 9.1 3.6 - 11.0 K/uL   RBC 4.24 3.80 - 5.20 MIL/uL   Hemoglobin 13.5 12.0 - 16.0 g/dL   HCT 09.639.1 04.535.0 - 40.947.0 %   MCV 92.1 80.0 - 100.0 fL   MCH 31.9 26.0 - 34.0 pg   MCHC 34.6 32.0 - 36.0 g/dL   RDW 81.114.0 91.411.5 - 78.214.5 %    Platelets 236 150 - 440 K/uL  Urine Drug Screen, Qualitative  Result Value Ref Range   Tricyclic, Ur Screen NONE DETECTED NONE DETECTED   Amphetamines, Ur Screen NONE DETECTED NONE DETECTED   MDMA (Ecstasy)Ur Screen NONE DETECTED NONE DETECTED   Cocaine Metabolite,Ur [place] NONE DETECTED NONE DETECTED   Opiate, Ur Screen NONE DETECTED NONE DETECTED   Phencyclidine (PCP) Ur S NONE DETECTED NONE DETECTED   Cannabinoid 50 Ng, Ur [place] NONE DETECTED NONE DETECTED   Barbiturates, Ur Screen NONE DETECTED NONE DETECTED   Benzodiazepine, Ur Scrn NONE DETECTED NONE DETECTED   Methadone Scn, Ur NONE DETECTED NONE DETECTED
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
## 2016-06-03 NOTE — ED Notes (Signed)

## 2016-06-07 DIAGNOSIS — Z79899 Other long term (current) drug therapy: Secondary | ICD-10-CM | POA: Diagnosis not present

## 2016-06-19 DIAGNOSIS — Z79899 Other long term (current) drug therapy: Secondary | ICD-10-CM | POA: Diagnosis not present

## 2016-06-19 DIAGNOSIS — E785 Hyperlipidemia, unspecified: Secondary | ICD-10-CM | POA: Diagnosis not present

## 2016-06-19 DIAGNOSIS — E119 Type 2 diabetes mellitus without complications: Secondary | ICD-10-CM | POA: Diagnosis not present

## 2016-06-19 DIAGNOSIS — R350 Frequency of micturition: Secondary | ICD-10-CM | POA: Diagnosis not present

## 2016-06-19 DIAGNOSIS — M109 Gout, unspecified: Secondary | ICD-10-CM | POA: Diagnosis not present

## 2016-06-19 DIAGNOSIS — Z124 Encounter for screening for malignant neoplasm of cervix: Secondary | ICD-10-CM | POA: Diagnosis not present

## 2016-10-30 ENCOUNTER — Emergency Department
Admission: EM | Admit: 2016-10-30 | Discharge: 2016-10-30 | Disposition: A | Discharge status: Home / Self Care | Payer: Medicare Other | Attending: Emergency Medicine | Admitting: Emergency Medicine

## 2016-10-30 ENCOUNTER — Encounter: Payer: Self-pay | Admitting: Emergency Medicine

## 2016-10-30 DIAGNOSIS — J020 Streptococcal pharyngitis: Secondary | ICD-10-CM | POA: Insufficient documentation

## 2016-10-30 DIAGNOSIS — J45909 Unspecified asthma, uncomplicated: Secondary | ICD-10-CM | POA: Diagnosis not present

## 2016-10-30 DIAGNOSIS — F17200 Nicotine dependence, unspecified, uncomplicated: Secondary | ICD-10-CM | POA: Diagnosis not present

## 2016-10-30 DIAGNOSIS — Z79899 Other long term (current) drug therapy: Secondary | ICD-10-CM | POA: Diagnosis not present

## 2016-10-30 DIAGNOSIS — R0981 Nasal congestion: Secondary | ICD-10-CM | POA: Diagnosis present

## 2016-10-30 DIAGNOSIS — J0100 Acute maxillary sinusitis, unspecified: Secondary | ICD-10-CM | POA: Insufficient documentation

## 2016-10-30 MED ORDER — AMOXICILLIN 500 MG PO CAPS
500.0000 mg | ORAL_CAPSULE | Freq: Once | ORAL | Status: AC
  Administered 2016-10-30: 500 mg via ORAL
  Filled 2016-10-30: qty 1

## 2016-10-30 MED ORDER — GUAIFENESIN-CODEINE 100-10 MG/5ML PO SOLN: 10.0000 mL | Freq: Three times a day (TID) | ORAL | 0 refills | Status: DC | PRN

## 2016-10-30 MED ORDER — AMOXICILLIN 500 MG PO TABS: 500.0000 mg | ORAL_TABLET | Freq: Three times a day (TID) | ORAL | 0 refills | Status: DC

## 2016-10-30 NOTE — ED Triage Notes (Signed)
Patient ambulatory to triage with steady gait, without difficulty or distress noted; pt reports x 4 days having sinus drainage/pressure and lymph node swelling

## 2016-10-30 NOTE — ED Provider Notes (Signed)
[facility] Emergency Department Provider Note  ____________________________________________  Time seen: Approximately 8:03 PM  I have reviewed the triage vital signs and the nursing notes.   HISTORY  Chief Complaint Nasal Congestion and Facial Pain   HPI [name] is a 41 y.o. female who presents to the emergency department for treatment and evaluation of sore throat, sinus pain, pressure, drainage, and lymph node swelling x 4 days. No cough or fever. No relief with OTC medications.   Past Medical History:  Diagnosis Date  . Alcohol abuse   . Anxiety   . Anxiety and depression   . Asthma   . Bipolar disorder ([name])   . Esophageal varices ([name])   . 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[name])   . Gout   . Pancreatitis     Patient Active Problem List   Diagnosis Date Noted  . Anxiety and depression 10/01/2012  . Protein-calorie malnutrition, severe ([name]) 09/30/2012  . Alcohol abuse 09/26/2012  . Trichomonas infection 09/26/2012  . Dehydration 09/26/2012  . Hypokalemia 11/14/2011  . Thrombocytopenia ([name]) 11/14/2011  . Alcohol withdrawal ([name]) 11/13/2011  . Bipolar 2 disorder ([name]) 11/13/2011  . Tobacco abuse 11/13/2011  . Cocaine abuse 11/13/2011  . Gout 11/13/2011    Past Surgical History:  Procedure Laterality Date  . HERNIA REPAIR      Prior to Admission medications   Medication Sig Start Date End Date Taking? Authorizing Provider  albuterol (PROVENTIL HFA;VENTOLIN HFA) 108 (90 BASE) MCG/ACT inhaler Inhale 2 puffs into the lungs every 6 (six) hours as needed for wheezing.    [provider]  amoxicillin (AMOXIL) 500 MG tablet Take 1 tablet (500 mg total) by mouth 3 (three) times daily. 10/30/16   Elias Dennington B, FNP  ARIPiprazole (ABILIFY) 15 MG tablet Take 15 mg by mouth at bedtime. 10/03/14   [provider]  ARIPiprazole (ABILIFY) 2 MG tablet Take 1 tablet (2 mg total) by mouth daily. Patient not taking:  Reported on 10/20/2014 10/02/12   Renae Fickle, MD  FLUoxetine (PROZAC) 20 MG capsule Take 1 capsule (20 mg total) by mouth daily. Patient taking differently: Take 40 mg by mouth daily.  10/04/12   Renae Fickle, MD  furosemide (LASIX) 20 MG tablet Take 1 tablet (20 mg total) by mouth daily. 11/18/14   Rancour, Jeannett Senior, MD  guaiFENesin-codeine 100-10 MG/5ML syrup Take 10 mLs by mouth 3 (three) times daily as needed. 10/30/16   Rhealyn Cullen, Rulon Eisenmenger B, FNP  hydrOXYzine (ATARAX/VISTARIL) 25 MG tablet Take 1 tablet (25 mg total) by mouth every 6 (six) hours as needed for anxiety. Patient not taking: Reported on 10/20/2014 10/02/12   Renae Fickle, MD  LYRICA 50 MG capsule Take 50 mg by mouth 3 (three) times daily. 10/13/14   [provider]  Pantoprazole Sodium (PROTONIX PO) Take by mouth.    [provider]  terbinafine (LAMISIL) 250 MG tablet Take 250 mg by mouth daily.    [provider]  topiramate (TOPAMAX) 100 MG tablet Take 100 mg by mouth at bedtime. 10/03/14   [provider]    Allergies Erythromycin; Sulfa antibiotics; Tylenol [acetaminophen]; and Nsaids  No family history on file.  Social History Social History  Substance Use Topics  . Smoking status: Current Every Day Smoker    Packs/day: 0.50  . Smokeless tobacco: Never Used  . Alcohol use Yes     Comment: in rehab    Review of Systems Constitutional: Negative for fever/chills ENT: Positive for sore  throat. Cardiovascular: Denies chest pain. Respiratory: Negative for shortness of breath. negative for cough. Gastrointestinal: Negative for  nausea,   vomiting.  No diarrhea.  Musculoskeletal: Negative for body aches Skin: Negative for rash. Neurological: Negative for headaches ____________________________________________   PHYSICAL EXAM:  VITAL SIGNS: ED Triage Vitals [10/30/16 1945]  Enc Vitals Group     BP 124/84     Pulse Rate (!) 108     Resp 20     Temp (!) 97.5 F (36.4 C)      Temp Source Oral     SpO2 95 %     Weight 230 lb (104.3 kg)     Height 5\' 7"  (1.702 m)     Head Circumference      Peak Flow      Pain Score 8     Pain Loc      Pain Edu?      Excl. in GC?     Constitutional: Alert and oriented. Well appearing and in no acute distress. Eyes: Conjunctivae are normal. EOMI. Ears: Bilateral TM are normal. Nose: Maxillary sinus congestion noted; no rhinnorhea. Mouth/Throat: Mucous membranes are moist.  Oropharynx erythematous . Tonsils 2+with exudate. Uvula is midline with erythema and white exudate as well. Neck: No stridor.  Lymphatic: Tender, palpable bilateral anterior cervical lymphadenopathy. Cardiovascular: Normal rate, regular rhythm. Good peripheral circulation. Respiratory: Normal respiratory effort.  No retractions. Breath sounds clear to auscultation. Gastrointestinal: Soft and nontender.  Musculoskeletal: FROM x 4 extremities.  Neurologic:  Normal speech and language.  Skin:  Skin is warm, dry and intact. No rash noted. Psychiatric: Mood and affect are normal. Speech and behavior are normal.  ____________________________________________   LABS (all labs ordered are listed, but only abnormal results are displayed)  Labs Reviewed - No data to display ____________________________________________  EKG  Not indicated. ____________________________________________  RADIOLOGY  Not indicated. ____________________________________________   PROCEDURES  Procedure(s) performed: None  Critical Care performed: No ____________________________________________   INITIAL IMPRESSION / ASSESSMENT AND PLAN / ED COURSE  41 year old female presenting to the emergency department for evaluation and treatment of symptoms most consistent with sinusitis and strep pharyngitis. She will be treated with amoxicillin and guaifenesin with codeine for pain/sinus decongestant. She is to follow up with her PCP for symptoms that are not improving over the  next few days. She is to return to the ER for symptoms that change or worsen if unable to schedule an appointment.  Pertinent labs & imaging results that were available during my care of the patient were reviewed by me and considered in my medical decision making (see chart for details).  Discharge Medication List as of 10/30/2016  8:33 PM    START taking these medications   Details  amoxicillin (AMOXIL) 500 MG tablet Take 1 tablet (500 mg total) by mouth 3 (three) times daily., Starting Tue 10/30/2016, Print    guaiFENesin-codeine 100-10 MG/5ML syrup Take 10 mLs by mouth 3 (three) times daily as needed., Starting Tue 10/30/2016, Print        If controlled substance prescribed during this visit, 12 month history viewed on the [place]CCSRS prior to issuing an initial prescription for Schedule II or III opiod. ____________________________________________   FINAL CLINICAL IMPRESSION(S) / ED DIAGNOSES  Final diagnoses:  Acute non-recurrent maxillary sinusitis  Strep throat    Note:  This document was prepared using Dragon voice recognition software and may include unintentional dictation errors.     Chinita Pesterriplett, Ayeisha Lindenberger B, FNP 10/30/16 2117  Myrna Blazer, MD 10/30/16 [phone]

## 2016-11-05 ENCOUNTER — Other Ambulatory Visit: Payer: Self-pay

## 2016-11-05 NOTE — Patient Outreach (Signed)
[facility]) Care Management  11/05/2016  [name] Mar 27, 1975 [id number]    Telephone Screen  Referral Date: 11/02/16 Referral Source: [facility] ED Census Referral Referral Reason: "ED visit on 10/30/16" Insurance: [facility] Medicare   Outreach attempt # 1  to patient. A female answered the phone and when RN CM asked to speak with patient he hung up.      Plan: RN CM will make outreach attempt to patient within three business days.    [name], RN,BSN,CCM [facility] Care Management Telephonic Care Management Coordinator Direct Phone: [phone] Toll Free: [phone] Fax: [phone]

## 2016-11-07 ENCOUNTER — Other Ambulatory Visit: Payer: Self-pay

## 2016-11-07 NOTE — Patient Outreach (Signed)
[facility]) Care Management  11/07/2016  LAMA BURCKHARD [birth year] [id number]   Telephone Screen  Referral Date: 11/02/16 Referral Source: [facility] ED Census Referral Referral Reason: "ED visit on 10/30/16" Insurance: [facility] Medicare   Outreach attempt # 2 to patient. No answer. RN CM left HIPAA compliant voicemail message along with contact info.     Plan: RN CM will make outreach attempt to patient within one week if no return call.    [name], RN,BSN,CCM [facility] Care Management Telephonic Care Management Coordinator Direct Phone: [phone] Toll Free: [phone] Fax: [phone]

## 2016-11-12 ENCOUNTER — Other Ambulatory Visit: Payer: Self-pay

## 2016-11-12 NOTE — Patient Outreach (Signed)
[facility]) Care Management  11/12/2016  [name] August 24, 1975 [id number]   Telephone Screen  Referral Date: 11/02/16 Referral Source: [facility] ED Census Referral Referral Reason: "ED visit on 10/30/16" Insurance: [facility] Medicare   Outreach attempt #3 to patient. No answer at present.     Plan: RN CM will send unsuccessful outreach letter to patient and close case if no response within 10 business days.    [name], RN,BSN,CCM [facility] Care Management Telephonic Care Management Coordinator Direct Phone: [phone] Toll Free: [phone] Fax: [phone]

## 2016-11-26 ENCOUNTER — Other Ambulatory Visit: Payer: Self-pay

## 2016-11-26 DIAGNOSIS — G629 Polyneuropathy, unspecified: Secondary | ICD-10-CM | POA: Diagnosis not present

## 2016-11-26 DIAGNOSIS — E785 Hyperlipidemia, unspecified: Secondary | ICD-10-CM | POA: Diagnosis not present

## 2016-11-26 DIAGNOSIS — K219 Gastro-esophageal reflux disease without esophagitis: Secondary | ICD-10-CM | POA: Diagnosis not present

## 2016-11-26 DIAGNOSIS — M109 Gout, unspecified: Secondary | ICD-10-CM | POA: Diagnosis not present

## 2016-11-26 DIAGNOSIS — Z79899 Other long term (current) drug therapy: Secondary | ICD-10-CM | POA: Diagnosis not present

## 2016-11-26 DIAGNOSIS — Z1231 Encounter for screening mammogram for malignant neoplasm of breast: Secondary | ICD-10-CM | POA: Diagnosis not present

## 2016-11-26 DIAGNOSIS — N3281 Overactive bladder: Secondary | ICD-10-CM | POA: Diagnosis not present

## 2016-11-26 DIAGNOSIS — E119 Type 2 diabetes mellitus without complications: Secondary | ICD-10-CM | POA: Diagnosis not present

## 2016-11-26 NOTE — Patient Outreach (Signed)
[facility] Care Management  11/26/2016  [name] November 29, 1975 [id number]     Telephone Screen  Referral Date: 11/02/16 [facility] Ref[facility] Reason: "ED visit on 10/30/16" [facility]    Multiple attempts to establish contact with patient without success. No response from letter mailed to patient. Case is being closed at this time.    Plan: RN CM will notify [facility] administrative assistant of case status.   Antionette Fairyoshanda Pearlee Arvizu, RN,BSN,CCM [facility] Care Management Telephonic Care Management Coordinator Direct Phone: [phone] Toll Free: [phone] Fax: [phone]

## 2016-11-28 DIAGNOSIS — Z79899 Other long term (current) drug therapy: Secondary | ICD-10-CM | POA: Diagnosis not present

## 2016-12-10 ENCOUNTER — Emergency Department
Admission: EM | Admit: 2016-12-10 | Discharge: 2016-12-10 | Disposition: A | Discharge status: Home / Self Care | Payer: Medicare Other | Attending: Student in an Organized Health Care Education/Training Program | Admitting: Student in an Organized Health Care Education/Training Program

## 2016-12-10 ENCOUNTER — Encounter: Payer: Self-pay | Admitting: Emergency Medicine

## 2016-12-10 DIAGNOSIS — Z79899 Other long term (current) drug therapy: Secondary | ICD-10-CM | POA: Insufficient documentation

## 2016-12-10 DIAGNOSIS — H1033 Unspecified acute conjunctivitis, bilateral: Secondary | ICD-10-CM | POA: Diagnosis not present

## 2016-12-10 DIAGNOSIS — H109 Unspecified conjunctivitis: Secondary | ICD-10-CM | POA: Diagnosis not present

## 2016-12-10 DIAGNOSIS — J45909 Unspecified asthma, uncomplicated: Secondary | ICD-10-CM | POA: Insufficient documentation

## 2016-12-10 DIAGNOSIS — F17200 Nicotine dependence, unspecified, uncomplicated: Secondary | ICD-10-CM | POA: Insufficient documentation

## 2016-12-10 DIAGNOSIS — B958 Unspecified staphylococcus as the cause of diseases classified elsewhere: Secondary | ICD-10-CM | POA: Diagnosis not present

## 2016-12-10 DIAGNOSIS — J020 Streptococcal pharyngitis: Secondary | ICD-10-CM | POA: Diagnosis not present

## 2016-12-10 DIAGNOSIS — H578 Other specified disorders of eye and adnexa: Secondary | ICD-10-CM | POA: Diagnosis present

## 2016-12-10 MED ORDER — CIPROFLOXACIN HCL 0.3 % OP SOLN: 1.0000 [drp] | OPHTHALMIC | 0 refills | Status: AC

## 2016-12-10 MED ORDER — CLINDAMYCIN HCL 300 MG PO CAPS: 300.0000 mg | ORAL_CAPSULE | Freq: Four times a day (QID) | ORAL | 0 refills | Status: AC

## 2016-12-10 NOTE — ED Provider Notes (Signed)
[facility] Emergency Department Provider Note  ____________________________________________  Time seen: Approximately 9:23 PM  I have reviewed the triage vital signs and the nursing notes.   HISTORY  Chief Complaint Conjunctivitis    HPI [name] is a 41 y.o. female presenting to the emergency department with bilateral conjunctivitis with purulent exudate and crusting along with 2 regions of cellulitis with yellow crusting and pus along the anterior chest that has been apparent for the past 3 days. She denies rhinorrhea, congestion and nonproductive cough. 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 Patient has had photophobia and increased tearing but no changes in vision. Patient denies globe trauma. No alleviating measures have been attempted.    Past Medical History:  Diagnosis Date  . Alcohol abuse   . Anxiety   . Anxiety and depression   . Asthma   . Bipolar disorder ([name])   . Esophageal varices ([name])   . 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[name])   . Gout   . Pancreatitis     Patient Active Problem List   Diagnosis Date Noted  . Anxiety and depression 10/01/2012  . Protein-calorie malnutrition, severe ([name]) 09/30/2012  . Alcohol abuse 09/26/2012  . Trichomonas infection 09/26/2012  . Dehydration 09/26/2012  . Hypokalemia 11/14/2011  . Thrombocytopenia ([name]) 11/14/2011  . Alcohol withdrawal ([name]) 11/13/2011  . Bipolar 2 disorder ([name]) 11/13/2011  . Tobacco abuse 11/13/2011  . Cocaine abuse 11/13/2011  . Gout 11/13/2011    Past Surgical History:  Procedure Laterality Date  . HERNIA REPAIR      Prior to Admission medications   Medication Sig Start Date End Date Taking? Authorizing Provider  albuterol (PROVENTIL HFA;VENTOLIN HFA) 108 (90 BASE) MCG/ACT inhaler Inhale 2 puffs into the lungs every 6 (six) hours as needed for wheezing.    [provider]  amoxicillin (AMOXIL) 500 MG tablet Take 1 tablet (500 mg  total) by mouth 3 (three) times daily. 10/30/16   Triplett, Cari B, FNP  ARIPiprazole (ABILIFY) 15 MG tablet Take 15 mg by mouth at bedtime. 10/03/14   [provider]  ARIPiprazole (ABILIFY) 2 MG tablet Take 1 tablet (2 mg total) by mouth daily. Patient not taking: Reported on 10/20/2014 10/02/12   Renae Fickle, MD  ciprofloxacin (CILOXAN) 0.3 % ophthalmic solution Place 1 drop into both eyes every 4 (four) hours while awake. Administer 1 drop, every 2 hours, while awake, for 2 days. Then 1 drop, every 4 hours, while awake, for the next 5 days. 12/10/16 12/15/16  Orvil Feil, PA-C  clindamycin (CLEOCIN) 300 MG capsule Take 1 capsule (300 mg total) by mouth 4 (four) times daily. 12/10/16 12/20/16  Orvil Feil, PA-C  FLUoxetine (PROZAC) 20 MG capsule Take 1 capsule (20 mg total) by mouth daily. Patient taking differently: Take 40 mg by mouth daily.  10/04/12   Renae Fickle, MD  furosemide (LASIX) 20 MG tablet Take 1 tablet (20 mg total) by mouth daily. 11/18/14   Rancour, Jeannett Senior, MD  guaiFENesin-codeine 100-10 MG/5ML syrup Take 10 mLs by mouth 3 (three) times daily as needed. 10/30/16   Triplett, Rulon Eisenmenger B, FNP  hydrOXYzine (ATARAX/VISTARIL) 25 MG tablet Take 1 tablet (25 mg total) by mouth every 6 (six) hours as needed for anxiety. Patient not taking: Reported on 10/20/2014 10/02/12   Renae Fickle, MD  LYRICA 50 MG capsule Take 50 mg by mouth 3 (three) times daily. 10/13/14   [provider]  Pantoprazole Sodium (PROTONIX PO) Take by mouth.  [provider]  terbinafine (LAMISIL) 250 MG tablet Take 250 mg by mouth daily.    [provider]  topiramate (TOPAMAX) 100 MG tablet Take 100 mg by mouth at bedtime. 10/03/14   [provider]    Allergies Erythromycin; Sulfa antibiotics; Tylenol [acetaminophen]; and Nsaids  No family history on file.  Social History Social History  Substance Use Topics  . Smoking status: Current Every Day Smoker     Packs/day: 0.50  . Smokeless tobacco: Never Used  . Alcohol use Yes     Comment: in rehab     Review of Systems  Constitutional: No fever/chills Eyes: No visual changes. Patient has Bilateral conjunctivitis with discharge. ENT: No upper respiratory complaints. Cardiovascular: no chest pain. Respiratory: no cough. No SOB. Musculoskeletal: Negative for musculoskeletal pain. Skin: She has 2 regions of cellulitis with crusting. Neurological: Negative for headaches, focal weakness or numbness.   ____________________________________________   PHYSICAL EXAM:  VITAL SIGNS: ED Triage Vitals  Enc Vitals Group     BP 12/10/16 1938 133/83     Pulse Rate 12/10/16 1938 (!) 105     Resp 12/10/16 1938 17     Temp 12/10/16 1938 97.7 F (36.5 C)     Temp Source 12/10/16 1938 Oral     SpO2 12/10/16 1938 95 %     Weight 12/10/16 1938 227 lb (103 kg)     Height 12/10/16 1938  (1.702 m)     Head Circumference --      Peak Flow --      Pain Score 12/10/16 1937 4     Pain Loc --      Pain Edu? --      Excl. in GC? --      Constitutional: Alert and oriented. Well appearing and in no acute distress. Eyes: She has bilateral bulbar conjunctivitis with increased tearing and purulent exudate. Diffuse crusting visualized. PERRL. EOMI. Head: Atraumatic. ENT:      Ears: Tympanic membranes are pearly bilaterally.      Nose: No congestion/rhinnorhea.      Mouth/Throat: Mucous membranes are moist.  Cardiovascular: Normal rate, regular rhythm. Normal S1 and S2.  Good peripheral circulation. Respiratory: Normal respiratory effort without tachypnea or retractions. Lungs CTAB. Good air entry to the bases with no decreased or absent breath sounds. Musculoskeletal: Full range of motion to all extremities. No gross deformities appreciated. Neurologic:  Normal speech and language. No gross focal neurologic deficits are appreciated.  Skin: Patient has 2 regions of cellulitis along the anterior chest  with purulent exudate and crusting. Psychiatric: Mood and affect are normal. Speech and behavior are normal. Patient exhibits appropriate insight and judgement.   ____________________________________________   LABS (all labs ordered are listed, but only abnormal results are displayed)  Labs Reviewed - No data to display ____________________________________________  EKG   ____________________________________________  RADIOLOGY   No results found.  ____________________________________________    PROCEDURES  Procedure(s) performed:    Procedures    Medications - No data to display   ____________________________________________   INITIAL IMPRESSION / ASSESSMENT AND PLAN / ED COURSE  Pertinent labs & imaging results that were available during my care of the patient were reviewed by me and considered in my medical decision making (see chart for details).  Review of the [place] CSRS was performed in accordance of the [facility] prior to dispensing any controlled drugs.     Assessment and plan Staph infection Bacterial conjunctivitis Patient presents to the emergency department  with bacterial conjunctivitis and cellulitis with crusting and purulent exudate. I suspect staph infection with colonization of the bulbar conjunctiva. Patient was discharged with clindamycin and ciprofloxacin ophthalmic solution. Patient was advised to follow-up with primary care as needed. All patient questions were answered.   ____________________________________________  FINAL CLINICAL IMPRESSION(S) / ED DIAGNOSES  Final diagnoses:  Staph infection  Bacterial conjunctivitis      NEW MEDICATIONS STARTED DURING THIS VISIT:  New Prescriptions   CIPROFLOXACIN (CILOXAN) 0.3 % OPHTHALMIC SOLUTION    Place 1 drop into both eyes every 4 (four) hours while awake. Administer 1 drop, every 2 hours, while awake, for 2 days. Then 1 drop, every 4 hours, while awake, for the next 5 days.   CLINDAMYCIN  (CLEOCIN) 300 MG CAPSULE    Take 1 capsule (300 mg total) by mouth 4 (four) times daily.        This chart was dictated using voice recognition software/Dragon. Despite best efforts to proofread, errors can occur which can change the meaning. Any change was purely unintentional.    Gasper Lloyd 12/10/16 2129    Willy Eddy, MD 12/10/16 [phone]

## 2016-12-10 NOTE — ED Triage Notes (Signed)
Patient presents to ED via POV from home with c/o bilateral eye redness. Patient states she woke up and they were crusted over. Patient also report itching to her chest.

## 2016-12-11 ENCOUNTER — Other Ambulatory Visit: Payer: Self-pay

## 2016-12-11 NOTE — Patient Outreach (Signed)
Outreach patient after ED visit on 12/10/16.  Unable to make contact with patient number given is not a working number and I am not able to leave a voicemail message asking for a return phone call.  In addition to the the phone call a letter will be mailed on today which includes [facility] Brochure and 24 Hour Nurse Advice Line.  This all after patient being assigned to a Telephonic Nurse on 11/02/16 and after 3 unsuccessful attempts being made and case being closed.

## 2016-12-24 DIAGNOSIS — H109 Unspecified conjunctivitis: Secondary | ICD-10-CM | POA: Diagnosis not present

## 2017-03-27 DIAGNOSIS — Z79899 Other long term (current) drug therapy: Secondary | ICD-10-CM | POA: Diagnosis not present

## 2017-06-10 DIAGNOSIS — Z79899 Other long term (current) drug therapy: Secondary | ICD-10-CM | POA: Diagnosis not present

## 2017-06-10 DIAGNOSIS — Z1331 Encounter for screening for depression: Secondary | ICD-10-CM | POA: Diagnosis not present

## 2017-06-10 DIAGNOSIS — R0602 Shortness of breath: Secondary | ICD-10-CM | POA: Diagnosis not present

## 2017-06-10 DIAGNOSIS — R5382 Chronic fatigue, unspecified: Secondary | ICD-10-CM | POA: Diagnosis not present

## 2017-06-10 DIAGNOSIS — E119 Type 2 diabetes mellitus without complications: Secondary | ICD-10-CM | POA: Diagnosis not present

## 2017-06-20 DIAGNOSIS — R109 Unspecified abdominal pain: Secondary | ICD-10-CM | POA: Diagnosis not present

## 2017-07-22 DIAGNOSIS — Z79899 Other long term (current) drug therapy: Secondary | ICD-10-CM | POA: Diagnosis not present

## 2017-07-22 DIAGNOSIS — M791 Myalgia, unspecified site: Secondary | ICD-10-CM | POA: Diagnosis not present

## 2017-07-22 DIAGNOSIS — E119 Type 2 diabetes mellitus without complications: Secondary | ICD-10-CM | POA: Diagnosis not present

## 2017-07-24 DIAGNOSIS — Z79899 Other long term (current) drug therapy: Secondary | ICD-10-CM | POA: Diagnosis not present

## 2017-08-20 DIAGNOSIS — G629 Polyneuropathy, unspecified: Secondary | ICD-10-CM | POA: Diagnosis not present

## 2017-08-20 DIAGNOSIS — M109 Gout, unspecified: Secondary | ICD-10-CM | POA: Diagnosis not present

## 2017-08-20 DIAGNOSIS — E785 Hyperlipidemia, unspecified: Secondary | ICD-10-CM | POA: Diagnosis not present

## 2017-08-20 DIAGNOSIS — K219 Gastro-esophageal reflux disease without esophagitis: Secondary | ICD-10-CM | POA: Diagnosis not present

## 2017-08-20 DIAGNOSIS — E119 Type 2 diabetes mellitus without complications: Secondary | ICD-10-CM | POA: Diagnosis not present

## 2017-08-20 DIAGNOSIS — Z79899 Other long term (current) drug therapy: Secondary | ICD-10-CM | POA: Diagnosis not present

## 2017-09-24 DIAGNOSIS — E785 Hyperlipidemia, unspecified: Secondary | ICD-10-CM | POA: Diagnosis not present

## 2017-09-24 DIAGNOSIS — E119 Type 2 diabetes mellitus without complications: Secondary | ICD-10-CM | POA: Diagnosis not present

## 2017-09-24 DIAGNOSIS — Z79899 Other long term (current) drug therapy: Secondary | ICD-10-CM | POA: Diagnosis not present

## 2017-09-24 DIAGNOSIS — J309 Allergic rhinitis, unspecified: Secondary | ICD-10-CM | POA: Diagnosis not present

## 2017-10-23 DIAGNOSIS — Z1231 Encounter for screening mammogram for malignant neoplasm of breast: Secondary | ICD-10-CM | POA: Diagnosis not present

## 2017-10-23 DIAGNOSIS — Z0000 Encounter for general adult medical examination without abnormal findings: Secondary | ICD-10-CM | POA: Diagnosis not present

## 2017-10-23 DIAGNOSIS — E785 Hyperlipidemia, unspecified: Secondary | ICD-10-CM | POA: Diagnosis not present

## 2017-12-12 DIAGNOSIS — Z79891 Long term (current) use of opiate analgesic: Secondary | ICD-10-CM | POA: Diagnosis not present

## 2017-12-16 DIAGNOSIS — R197 Diarrhea, unspecified: Secondary | ICD-10-CM | POA: Diagnosis not present

## 2017-12-18 DIAGNOSIS — E119 Type 2 diabetes mellitus without complications: Secondary | ICD-10-CM | POA: Diagnosis not present

## 2017-12-19 DIAGNOSIS — H5213 Myopia, bilateral: Secondary | ICD-10-CM | POA: Diagnosis not present

## 2018-01-07 DIAGNOSIS — R197 Diarrhea, unspecified: Secondary | ICD-10-CM | POA: Diagnosis not present

## 2018-01-07 DIAGNOSIS — R1031 Right lower quadrant pain: Secondary | ICD-10-CM | POA: Diagnosis not present

## 2018-01-09 DIAGNOSIS — R197 Diarrhea, unspecified: Secondary | ICD-10-CM | POA: Diagnosis not present

## 2018-01-17 DIAGNOSIS — R197 Diarrhea, unspecified: Secondary | ICD-10-CM | POA: Diagnosis not present

## 2018-01-23 ENCOUNTER — Ambulatory Visit: Payer: Self-pay | Admitting: Neurology

## 2018-01-28 DIAGNOSIS — H5213 Myopia, bilateral: Secondary | ICD-10-CM | POA: Diagnosis not present

## 2018-03-05 DIAGNOSIS — Z79891 Long term (current) use of opiate analgesic: Secondary | ICD-10-CM | POA: Diagnosis not present

## 2018-03-06 DIAGNOSIS — E785 Hyperlipidemia, unspecified: Secondary | ICD-10-CM | POA: Diagnosis not present

## 2018-03-06 DIAGNOSIS — Z79899 Other long term (current) drug therapy: Secondary | ICD-10-CM | POA: Diagnosis not present

## 2018-03-06 DIAGNOSIS — R197 Diarrhea, unspecified: Secondary | ICD-10-CM | POA: Diagnosis not present

## 2018-03-06 DIAGNOSIS — M109 Gout, unspecified: Secondary | ICD-10-CM | POA: Diagnosis not present

## 2018-03-06 DIAGNOSIS — R1031 Right lower quadrant pain: Secondary | ICD-10-CM | POA: Diagnosis not present

## 2018-03-06 DIAGNOSIS — K219 Gastro-esophageal reflux disease without esophagitis: Secondary | ICD-10-CM | POA: Diagnosis not present

## 2018-03-06 DIAGNOSIS — E119 Type 2 diabetes mellitus without complications: Secondary | ICD-10-CM | POA: Diagnosis not present

## 2018-03-26 DIAGNOSIS — Z79899 Other long term (current) drug therapy: Secondary | ICD-10-CM | POA: Diagnosis not present

## 2018-03-28 ENCOUNTER — Ambulatory Visit (INDEPENDENT_AMBULATORY_CARE_PROVIDER_SITE_OTHER): Payer: Medicare Other | Admitting: Neurology

## 2018-03-28 ENCOUNTER — Encounter: Payer: Self-pay | Admitting: Neurology

## 2018-03-28 VITALS — BP 138/92 | HR 108 | Ht 67.0 in | Wt 227.0 lb

## 2018-03-28 DIAGNOSIS — G7102 Facioscapulohumeral muscular dystrophy: Secondary | ICD-10-CM

## 2018-03-28 NOTE — Progress Notes (Signed)
Reason for visit: FSHD  Referring physician: Dr. Elvera Maria'Buch  [name] is a 43 y.o. female  History of present illness:  Ms. [name] is a 43 year old right-handed white female with a history of obesity, diabetes, bipolar disorder, and a prior history of alcohol and cocaine abuse.  The patient has a history of FSH muscular dystrophy.  She has a strong family history of this, her mother and a maternal uncle were affected.  She has no first cousins with this disease.  The patient had initially noted some weakness in the lower extremities when she was in her late 2720s.  The patient has progressed to have severe bilateral foot drops, she has AFO braces but does not wear them regularly.  The patient has developed some weakness of the arms, particularly with the deltoid muscles and has difficulty raising her arms up above her head.  The patient has fallen on occasion, she has fatigue of the legs when she has to walk longer distances such as in a store, she has to hold onto a cart in order to maintain the upright posture.  The patient does have significant low back pain when she is partially stoop with washing dishes.  Otherwise, she does not have back pain.  She has been trying to lose weight actively.  The patient does note some numbness in the toes, she may have some cramping in the feet at times.  She has some discomfort from the elbows to the shoulders on both arms.  The patient denies issues controlling the bowels or the bladder.  The patient comes to this office for an evaluation.   Past Medical History:  Diagnosis Date  . Alcohol abuse   . Anxiety   . Anxiety   . Anxiety and depression   . Asthma   . Bipolar disorder ([name])   . Borderline personality disorder ([name])   . Depression   . Diabetes ([name])    Type 2   . Dyslipidemia   . Esophageal varices ([name])   . Foot drop    right  . 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[name])   . GERD (gastroesophageal reflux disease)   . Gout   .  History of drug abuse ([name])   . Insomnia   . Muscular dystrophies ([name])   . Neuropathy   . Pancreatitis   . 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 HERNIA REPAIR      Family History  Problem Relation Age of Onset  . Heart disease Mother   . Heart attack Mother   . Hyperlipidemia Maternal Grandmother   . Diabetes Maternal Grandmother   . Hypertension Maternal Grandmother   . Hyperlipidemia Maternal Grandfather   . Heart disease Maternal Grandfather   . Heart attack Maternal Grandfather   . Diabetes Maternal Grandfather   . Lung cancer Maternal Grandfather   . Hypertension Maternal Grandfather   . Other Paternal Grandmother        Thyroid problems   . Heart disease Paternal Grandfather   . Stroke Paternal Grandfather   . Hyperlipidemia Paternal Grandfather   . Heart attack Paternal Grandfather   . Hypertension Paternal Grandfather     Social history:  reports that she has been smoking. She has been smoking about 0.50 packs per day. She has never used smokeless tobacco. She reports previous alcohol use. She reports that she does not use drugs.  Medications:  Prior to Admission medications  Medication Sig Start Date End Date Taking? Authorizing Provider  albuterol (PROVENTIL HFA;VENTOLIN HFA) 108 (90 BASE) MCG/ACT inhaler Inhale 2 puffs into the lungs every 6 (six) hours as needed for wheezing.   Yes [provider]  ARIPiprazole (ABILIFY) 20 MG tablet Take 20 mg by mouth daily.   Yes [provider]  atorvastatin (LIPITOR) 40 MG tablet Take 40 mg by mouth daily.   Yes [provider]  calcium-vitamin D (OSCAL WITH D) 500-200 MG-UNIT tablet Take 1 tablet by mouth.   Yes [provider]  clonazePAM (KLONOPIN) 1 MG tablet Take 1 mg by mouth 3 (three) times daily as needed for anxiety.   Yes [provider]  diphenoxylate-atropine (LOMOTIL) 2.5-0.025 MG tablet Take by mouth 4 (four) times  daily as needed for diarrhea or loose stools.   Yes [provider]  esomeprazole (NEXIUM) 40 MG capsule Take 40 mg by mouth daily at 12 noon.   Yes [provider]  FLUoxetine (PROZAC) 40 MG capsule Take 80 mg by mouth daily.   Yes [provider]  fluticasone (FLONASE) 50 MCG/ACT nasal spray Place into both nostrils daily.   Yes [provider]  LYRICA 50 MG capsule Take 50 mg by mouth 3 (three) times daily. 10/13/14  Yes [provider]  metFORMIN (GLUCOPHAGE) 500 MG tablet Take 500 mg by mouth 2 (two) times daily with a meal.   Yes [provider]  methocarbamol (ROBAXIN) 500 MG tablet Take 500 mg by mouth 2 (two) times daily as needed.    Yes [provider]  Multiple Vitamin (MULTIVITAMIN) capsule Take 1 capsule by mouth daily.   Yes [provider]  PHENTERMINE HCL PO Take by mouth.   Yes [provider]  topiramate (TOPAMAX) 200 MG tablet Take 200 mg by mouth at bedtime.   Yes [provider]  zolpidem (AMBIEN) 10 MG tablet Take 10 mg by mouth at bedtime as needed for sleep.   Yes [provider]      Allergies  Allergen Reactions  . Erythromycin Anaphylaxis  . Sulfa Antibiotics Anaphylaxis  . Tylenol [Acetaminophen] Other (See Comments)    Pancreatitis and liver dysfunction, not supposed to take med  . Nsaids Other (See Comments)    Causes GI problems, very bad reaction-per patient.   . Gabapentin     Mood changes     ROS:  Out of a complete 14 system review of symptoms, the patient complains only of the following symptoms, and all other reviewed systems are negative.  Wheezing, snoring Blood in the stool Feeling hot Aching muscles Allergies Weakness Depression, anxiety, none of sleep Insomnia, snoring  Blood pressure (!) 138/92, pulse (!) 108, height 5\' 7"  (1.702 m), weight 227 lb (103 kg).  Physical Exam  General: The patient is alert and cooperative at the time of  the examination.  The patient is markedly obese.  Eyes: Pupils are equal, round, and reactive to light. Discs are flat bilaterally.  Neck: The neck is supple, no carotid bruits are noted.  Respiratory: The respiratory examination is clear.  Cardiovascular: The cardiovascular examination reveals a regular rate and rhythm, no obvious murmurs or rubs are noted.  Skin: Extremities are without significant edema.  Neurologic Exam  Mental status: The patient is alert and oriented x 3 at the time of the examination. The patient has apparent normal recent and remote memory, with an apparently normal attention span and concentration ability.  Cranial nerves: Facial symmetry is  present. There is good sensation of the face to pinprick and soft touch bilaterally. The strength of the facial muscles is weak bilaterally, the patient has good strength with shoulder shrug and with head turning and with flexion extension of the neck. Speech is well enunciated, no aphasia or dysarthria is noted. Extraocular movements are full. Visual fields are full. The tongue is midline, and the patient has symmetric elevation of the soft palate. No obvious hearing deficits are noted.  Motor: The motor testing reveals fairly normal strength with the upper extremities with exception of 4/5 strength of the deltoid muscles bilaterally.  The patient has prominent bilateral foot drops, she has mild weakness with hip flexion bilaterally, she has good extension and flexion strength with the knees. Good symmetric motor tone is noted throughout.  Sensory: Sensory testing is intact to pinprick, soft touch, vibration sensation, and position sense on all 4 extremities, with exception of a stocking pattern pinprick sensory deficit up to the knees bilaterally. No evidence of extinction is noted.  Coordination: Cerebellar testing reveals good finger-nose-finger and heel-to-shin bilaterally.  Gait and station: Gait is wide-based, associated  with a bilateral steppage gait pattern. Rhomberg is negative.  Reflexes: Deep tendon reflexes are symmetric, but are depressed bilaterally. Toes are downgoing bilaterally.   Assessment/Plan:  1. FSH muscular dystrophy  2.  Diabetes  3.  Gait disorder  The patient reports that she has difficulty washing or brushing her hair.  She has had some fatigue of the lower extremities with prolonged weightbearing.  The patient is entering into a weight loss program which may help.  The patient will be sent for physical therapy for leg strengthening exercises and for occupational therapy evaluation.  The patient will follow up here in 6 months.  I have encouraged her to use her AFO braces more regularly to improve safety and prevent falls.   Marlan Palau. Keith Willis MD 03/28/2018 10:15 AM  [facility] [address] [place], [place] [id number]  Phone [phone] Fax [phone]

## 2018-04-10 DIAGNOSIS — R1031 Right lower quadrant pain: Secondary | ICD-10-CM | POA: Diagnosis not present

## 2018-04-10 DIAGNOSIS — J069 Acute upper respiratory infection, unspecified: Secondary | ICD-10-CM | POA: Diagnosis not present

## 2018-04-10 DIAGNOSIS — R197 Diarrhea, unspecified: Secondary | ICD-10-CM | POA: Diagnosis not present

## 2018-04-10 DIAGNOSIS — L209 Atopic dermatitis, unspecified: Secondary | ICD-10-CM | POA: Diagnosis not present

## 2018-04-10 DIAGNOSIS — R0600 Dyspnea, unspecified: Secondary | ICD-10-CM | POA: Diagnosis not present

## 2018-04-11 ENCOUNTER — Other Ambulatory Visit: Payer: Self-pay

## 2018-04-11 ENCOUNTER — Ambulatory Visit: Payer: Medicare Other

## 2018-04-11 ENCOUNTER — Ambulatory Visit: Payer: Medicare Other | Attending: Neurology | Admitting: Occupational Therapy

## 2018-04-11 ENCOUNTER — Encounter: Payer: Self-pay | Admitting: Occupational Therapy

## 2018-04-11 DIAGNOSIS — R2681 Unsteadiness on feet: Secondary | ICD-10-CM | POA: Diagnosis not present

## 2018-04-11 DIAGNOSIS — M6281 Muscle weakness (generalized): Secondary | ICD-10-CM

## 2018-04-11 DIAGNOSIS — R2689 Other abnormalities of gait and mobility: Secondary | ICD-10-CM | POA: Insufficient documentation

## 2018-04-11 DIAGNOSIS — R278 Other lack of coordination: Secondary | ICD-10-CM | POA: Diagnosis not present

## 2018-04-11 NOTE — Patient Instructions (Signed)
Access Code: [email or web address]  URL: [email or web address]  Date: 04/11/2018  Prepared by: Precious Bard   Exercises  Seated Toe Raise - 10 reps - 2 sets - 5 hold - 1x daily - 7x weekly  Side to Side Weight Shift with Counter Support - 10 reps - 2 sets - 5 hold - 1x daily - 7x weekly  Seated Long Arc Quad - 10 reps - 2 sets - 5 hold - 1x daily - 7x weekly

## 2018-04-11 NOTE — Therapy (Signed)
[facility] [address] , [place], [address] Phone: [phone]   Fax:  [phone]  Physical Therapy Evaluation  Patient Details  Name: Kellie RilingRyan N Dixon MRN: [id number] Date of Birth: [birth year] Referring Provider (PT): York Spanielcharles K willis   Encounter Date: 04/11/2018  PT End of Session - 04/11/18 1155    Visit Number  1    Number of Visits  16    Date for PT Re-Evaluation  06/06/18    Authorization Type  1/10 eval on 1/24    PT Start Time  1016    PT Stop Time  1115    PT Time Calculation (min)  59 min    Equipment Utilized During Treatment  Gait belt    Activity Tolerance  Patient tolerated treatment well;Patient limited by fatigue    Behavior During Therapy  WFL for tasks assessed/performed       Past Medical History:  Diagnosis Date  . Alcohol abuse   . Anxiety   . Anxiety   . Anxiety and depression   . Asthma   . Bipolar disorder ([name])   . Borderline personality disorder ([name])   . Depression   . Diabetes ([name])    Type 2   . Dyslipidemia   . Esophageal varices ([name])   . Foot drop    right  . 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[name])   . GERD (gastroesophageal reflux disease)   . Gout   . History of drug abuse ([name])   . Insomnia   . Muscular dystrophies ([name])   . Neuropathy   . Pancreatitis   . 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 HERNIA REPAIR      There were no vitals filed for this visit.         Subjective Assessment - 04/11/18 1026    Subjective  Patient is a pleasant 43 year old female who presents for instability and weakness secondary to FSHD.    Pertinent History  [name] is a 43 year old right-handed white female with a history of obesity, diabetes, bipolar disorder, and a prior history of alcohol and cocaine abuse.  The patient has a history of FSH muscular dystrophy.  Patient has foot drop on R side for the past 7  years. Reports she has braces for the ankles but not for the foot drop. The patient had initially noted some weakness in the lower extremities when she was in her late 5720s.  The patient has progressed to have severe bilateral foot drops, she has AFO braces but does not wear them regularly.  The patient has developed some weakness of the arms, particularly with the deltoid muscles and has difficulty raising her arms up above her head.  The patient has fallen on occasion, she has fatigue of the legs when she has to walk longer distances such as in a store, she has to hold onto a cart in order to maintain the upright posture.  The patient does have significant low back pain when she is partially stoop with washing dishes.  Otherwise, she does not have back pain.  She has been trying to lose weight actively.  The patient does note some numbness in the toes, she may have some cramping in the feet at times.  She has some discomfort from the elbows to the shoulders on both arms.  The patient denies issues controlling the bowels or the bladder.Patient reports she loses  her footing, does not depend on time of the day or surfaces. Falls frequently, wants to learn how to get up from falling down.     Limitations  Lifting;Standing;Walking;House hold activities    How long can you sit comfortably?  n/a     How long can you stand comfortably?  1-2 minutes    How long can you walk comfortably?  15 minutes    Patient Stated Goals  walk more steady and for longer distances.     Currently in Pain?  Yes    Pain Score  2     Pain Location  Shoulder    Pain Orientation  Right;Left    Pain Descriptors / Indicators  Aching    Pain Type  Chronic pain    Pain Onset  More than a month ago    Pain Frequency  Constant    Aggravating Factors   progresses throughout the day    Pain Relieving Factors  aleve    Effect of Pain on Daily Activities  limits stability.       PAIN: Shoulder pain: Worst pain: 8/10 Calves: Worst pain:  6/10 : pins and needles Back pain: Hurts walking, doing dishes:  5/10   POSTURE: Standing: slight flexion of bilateral knees. Noted trunk extension with forward pelvic positioning.   PROM/AROM:   Hip: slight limitation in hip extension bilaterally  Knee: WFL Bilaterally:  Ankle: PF equal WFL bilaterally  Ankle: df: neutral to both with PROM,  Ankle: unable to actively dorsiflex BLE   STRENGTH:  Graded on a 0-5 scale Muscle Group Left Right  Hip Flex 4-/5 4-/5  Hip Abd 4/5 4/5  Hip Add 4/5 4/5  Hip Ext 2+/5 2+/5  Hip IR/ER    Knee Flex 3-/5 3-/5  Knee Ext 4-/5 4-/5  Ankle DF 2-/5 2-/5  Ankle PF 3/5 3/5   SENSATION/Palpation/ Coordination Diabetic neuropathy  Palpable muscle contraction bilaterally for ankle dorsiflexion however unable to complete the muscle action.   Coordination: dymetria bilaterally with heel shin test  FUNCTIONAL MOBILITY: Supine <>sit: Mod I, takes additional time but able to perform Sit<>stand: requires BUE support from seat and has noted increased extension  moment resulting in instability upon initial ascent.   BALANCE: Dynamic Sitting Balance  Normal Able to sit unsupported and weight shift across midline maximally   Good Able to sit unsupported and weight shift across midline moderately   Good-/Fair+ Able to sit unsupported and weight shift across midline minimally x  Fair Minimal weight shifting ipsilateral/front, difficulty crossing midline   Fair- Reach to ipsilateral side and unable to weight shift   Poor + Able to sit unsupported with min A and reach to ipsilateral side, unable to weight shift   Poor Able to sit unsupported with mod A and reach ipsilateral/front-can't cross midline     Static Sitting Balance  Normal Able to maintain balance against maximal resistance   Good Able to maintain balance against moderate resistance   Good-/Fair+ Accepts minimal resistance x  Fair Able to sit unsupported without balance loss and without UE  support   Poor+ Able to maintain with Minimal assistance from individual or chair   Poor Unable to maintain balance-requires mod/max support from individual or chair     Standing Dynamic Balance  Normal Stand independently unsupported, able to weight shift and cross midline maximally   Good Stand independently unsupported, able to weight shift and cross midline moderately   Good-/Fair+ Stand independently unsupported, able to weight  shift across midline minimally   Fair Stand independently unsupported, weight shift, and reach ipsilaterally, loss of balance when crossing midline   Poor+ Able to stand with Min A and reach ipsilaterally, unable to weight shift x  Poor Able to stand with Mod A and minimally reach ipsilaterally, unable to cross midline.     Static Standing Balance  Normal Able to maintain standing balance against maximal resistance   Good Able to maintain standing balance against moderate resistance   Good-/Fair+ Able to maintain standing balance against minimal resistance   Fair Able to stand unsupported without UE support and without LOB for 1-2 min x  Fair- Requires Min A and UE support to maintain standing without loss of balance   Poor+ Requires mod A and UE support to maintain standing without loss of balance   Poor Requires max A and UE support to maintain standing balance without loss        GAIT: Steppage gait bilaterally with R>L with foot slap initial contact. With prolonged ambulation scissoring pattern increased with increasing trunk extension. Ambulates without AD or orthotics.   OUTCOME MEASURES: TEST Outcome Interpretation  5 times sit<>stand 16 sec with BUE support  >106 yo, >15 sec indicates increased risk for falls  6 minute walk test      390          Feet 1000 feet is community ambulator  ABC 40.6% Low level of physical functioning  Berg Balance Assessment 33/56 <36/56 (100% risk for falls), 37-45 (80% risk for falls); 46-51 (>50% risk for falls);  52-55 (lower risk <25% of falls)         [facility] PT Assessment - 04/11/18 1030      Assessment   Medical Diagnosis  instability/weakness    Referring Provider (PT)  charles K willis    Onset Date/Surgical Date  --   a couple years   Hand Dominance  Right    Prior Therapy  none      Precautions   Precautions  Fall      Restrictions   Weight Bearing Restrictions  No      Balance Screen   Has the patient fallen in the past 6 months  Yes    How many times?  7 or more times    Has the patient had a decrease in activity level because of a fear of falling?   Yes    Is the patient reluctant to leave their home because of a fear of falling?   No      Home Environment   Living Environment  Private residence    Living Arrangements  Non-relatives/Friends    Available Help at Discharge  Friend(s)    Type of Home  Apartment    Home Access  Level entry    Home Layout  One level    Home Equipment  None      Prior Function   Level of Independence  Independent    Vocation  On disability    Leisure  likes to read, listen to music, taking short trips.      Cognition   Overall Cognitive Status  History of cognitive impairments - at baseline      Standardized Balance Assessment   Standardized Balance Assessment  Berg Balance Test      Berg Balance Test   Sit to Stand  Able to stand  independently using hands    Standing Unsupported  Able to stand 2 minutes with supervision  Sitting with Back Unsupported but Feet Supported on Floor or Stool  Able to sit safely and securely 2 minutes    Stand to Sit  Uses backs of legs against chair to control descent    Transfers  Able to transfer safely, definite need of hands    Standing Unsupported with Eyes Closed  Able to stand 3 seconds    Standing Ubsupported with Feet Together  Able to place feet together independently but unable to hold for 30 seconds    From Standing, Reach Forward with Outstretched Arm  Reaches forward but needs  supervision    From Standing Position, Pick up Object from Floor  Able to pick up shoe, needs supervision    From Standing Position, Turn to Look Behind Over each Shoulder  Looks behind from both sides and weight shifts well    Turn 360 Degrees  Needs close supervision or verbal cueing    Standing Unsupported, Alternately Place Feet on Step/Stool  Able to complete >2 steps/needs minimal assist    Standing Unsupported, One Foot in Front  Needs help to step but can hold 15 seconds    Standing on One Leg  Able to lift leg independently and hold 5-10 seconds    Total Score  33       Treat:    Access Code: [email or web address]  URL: [email or web address]  Date: 04/11/2018  Prepared by: Precious BardMarina Amaiya Scruton   Exercises  Seated Toe Raise - 10 reps - 2 sets - 5 hold - 1x daily - 7x weekly  Side to Side Weight Shift with Counter Support - 10 reps - 2 sets - 5 hold - 1x daily - 7x weekly  Seated Long Arc Quad - 10 reps - 2 sets - 5 hold - 1x daily - 7x weekly       Objective measurements completed on examination: See above findings.              PT Education - 04/11/18 1155    Education Details  HEP, POC, goals    Person(s) Educated  Patient    Methods  Explanation;Demonstration;Tactile cues;Verbal cues;Handout    Comprehension  Verbalized understanding;Returned demonstration;Tactile cues required;Need further instruction;Verbal cues required       PT Short Term Goals - 04/11/18 1200      PT SHORT TERM GOAL #1   Title  Patient will be independent in home exercise program to improve strength/mobility for better functional independence with ADLs.    Baseline  HEP given     Time  2    Period  Weeks    Status  New    Target Date  04/25/18      PT SHORT TERM GOAL #2   Title  Patient will deny any falls over past 2 weeks to demonstrate improved safety awareness at home and in her natural environment    Baseline  Larey SeatFell the other day     Time  2    Period  Weeks    Status  New     Target Date  04/25/18        PT Long Term Goals - 04/11/18 1200      PT LONG TERM GOAL #1   Title   Patient (< 42 years old) will complete five times sit to stand test in < 10 seconds without UE support indicating an increased LE strength and improved balance.    Baseline  1/24: 16 seconds with BUE support     Time  8    Period  Weeks    Status  New    Target Date  06/06/18      PT LONG TERM GOAL #2   Title  Patient will increase Berg Balance score by > 6 points (39/56) to demonstrate decreased fall risk during functional activities.    Baseline  1/24: 33/56    Time  8    Period  Weeks    Status  New    Target Date  06/06/18      PT LONG TERM GOAL #3   Title  Patient will increase six minute walk test distance to >1000 for progression to community ambulator and improve gait ability    Baseline  1/24: 390 ft     Time  8    Period  Weeks    Status  New    Target Date  06/06/18      PT LONG TERM GOAL #4   Title  Patient will increase ABC scale score >80% to demonstrate better functional mobility and better confidence with ADLs.     Baseline  1/24: 40.6%    Time  8    Period  Weeks    Status  New    Target Date  06/06/18             Plan - 04/11/18 1156    Clinical Impression Statement  Patient is a pleasant 43 year old female who presents for instability and weakness secondary to FSHD. Patient has noted foot drop bilaterally however does have trace muscle activation that can be felt upon palpation. Steppage is main characteristic of gait pattern at this time combined with trunk extension and foot slap, scissoring occurs as patient fatigues. Patient's 5x STS performed in 16 seconds with BUE support and excessive trunk extension, 6 MWT resulted in 390 ft ambulated with increasing gait disturbances, ABC=40.6% and BERG 33/56. Patient will benefit from skilled physical therapy to increase strength of LE's and core for postural control and mobility, increase righting  reactions and stability, and decrease risk of falling for improved quality of life.     History and Personal Factors relevant to plan of care:  This patient presents with  3, personal factors/ comorbidities and  4  body elements including body structures and functions, activity limitations and or participation restrictions. Patient's condition is unstable    Clinical Presentation  Unstable    Clinical Presentation due to:  Patient has FSHD, has bipolar disorder and other cognitive conditions    Clinical Decision Making  High    Rehab Potential  Fair    Clinical Impairments Affecting Rehab Potential  (+) age, motivation (-) FSHD, depression/anxiety/bipolar. Chronicity of foot drop     PT Frequency  2x / week    PT Duration  8 weeks    PT Treatment/Interventions  ADLs/Self Care Home Management;Aquatic Therapy;Biofeedback;Cryotherapy;Electrical Stimulation;Iontophoresis 4mg /ml Dexamethasone;Moist Heat;Traction;Ultrasound;DME Instruction;Gait training;Therapeutic exercise;Therapeutic activities;Functional mobility training;Stair training;Balance training;Neuromuscular re-education;Patient/family education;Manual techniques;Orthotic Fit/Training;Compression bandaging;Passive range of motion;Vestibular;Canalith Repostioning;Taping;Splinting;Energy conservation;Dry needling    PT Next Visit Plan  review HEP, strengthen core and LE's, balance    PT Home Exercise Plan  see above    Recommended Other Services  n/a    Consulted and Agree with Plan of Care  Patient       Patient will benefit from skilled therapeutic intervention in order to improve the following deficits and impairments:  Abnormal gait, Decreased activity tolerance, Decreased coordination, Decreased balance, Decreased endurance, Decreased knowledge of use of DME,  Decreased safety awareness, Decreased range of motion, Decreased mobility, Decreased strength, Difficulty walking, Hypomobility, Increased fascial restricitons, Impaired flexibility,  Impaired perceived functional ability, Impaired sensation, Postural dysfunction, Improper body mechanics, Pain, Obesity  Visit Diagnosis: Other abnormalities of gait and mobility  Unsteadiness on feet  Muscle weakness (generalized)     Problem List Patient Active Problem List   Diagnosis Date Noted  . 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  . Anxiety and depression 10/01/2012  . Protein-calorie malnutrition, severe ([name]) 09/30/2012  . Alcohol abuse 09/26/2012  . Trichomonas infection 09/26/2012  . Dehydration 09/26/2012  . Hypokalemia 11/14/2011  . Thrombocytopenia ([name]) 11/14/2011  . Alcohol withdrawal ([name]) 11/13/2011  . Bipolar 2 disorder ([name]) 11/13/2011  . Tobacco abuse 11/13/2011  . Cocaine abuse ([name]) 11/13/2011  . Gout 11/13/2011   Precious Bard, PT, DPT   04/11/2018, 12:06 PM  [facility] MAIN [facility] SERVICES [address] [place], [place], [address] Phone: [phone]   Fax:  [phone]  Name: BERDINA CHEEVER MRN: [id number] Date of Birth: 08/18/1975

## 2018-04-11 NOTE — Therapy (Signed)
[facility] [address] [place], [place], [address] Phone: [phone]   Fax:  [phone]  Occupational Therapy Evaluation  Patient Details  Name: Kellie RilingRyan N Dixon MRN: [id number] Date of Birth: [birth year] No data recorded  Encounter Date: 04/11/2018  OT End of Session - 04/16/18 0906    Visit Number  1    Number of Visits  16    Date for OT Re-Evaluation  06/06/18    Authorization Type  [employer]     OT Start Time  0900    OT Stop Time  0955    OT Time Calculation (min)  55 min    Activity Tolerance  Patient tolerated treatment well    Behavior During Therapy  [facility] for tasks assessed/performed       Past Medical History:  Diagnosis Date  . Alcohol abuse   . Anxiety   . Anxiety   . Anxiety and depression   . Asthma   . Bipolar disorder ([name])   . Borderline personality disorder ([name])   . Depression   . Diabetes ([name])    Type 2   . Dyslipidemia   . Esophageal varices ([name])   . Foot drop    right  . 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[name])   . GERD (gastroesophageal reflux disease)   . Gout   . History of drug abuse ([name])   . Insomnia   . Muscular dystrophies ([name])   . Neuropathy   . Pancreatitis   . 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 HERNIA REPAIR      There were no vitals filed for this visit.  Subjective Assessment - 04/16/18 0859    Subjective   Patient reports an increase in weakness in recent months which limit her ability to perform and engage in self care tasks.     Pertinent History  Ms. [name] is a 43 year old right-handed white female with a history of obesity, diabetes, bipolar disorder, and a prior history of alcohol and cocaine abuse.  The patient has a history of FSH muscular dystrophy.  Patient has foot drop on R side for the past 7 years.     Patient Stated Goals  Patient reports she would like to get stronger and be as independent  as possible.     Currently in Pain?  No/denies    Pain Onset  More than a month ago        [facility] - 04/16/18 0901      Assessment   Medical Diagnosis  instability/weakness    Onset Date/Surgical Date  --   a couple years   Hand Dominance  Right    Prior Therapy  none      Precautions   Precautions  Fall      Restrictions   Weight Bearing Restrictions  No      Balance Screen   Has the patient fallen in the past 6 months  Yes    How many times?  6    Has the patient had a decrease in activity level because of a fear of falling?   Yes    Is the patient reluctant to leave their home because of a fear of falling?   No      Home  Environment   Family/patient expects to be discharged to:  Private residence    Living Arrangements  Non-relatives/Friends    Available Help at  Discharge  Family    Type of Home  Aartment    Home Access  Ramped entrance    Home Layout  One level    Alternate Level Stairs - Number of Steps  no steps to enter    Bathroom Shower/Tub  Tub/Shower unit    Shower/tub characteristics  Curtain    Lives With  Other (Comment)   2 room mates     Prior Function   Level of Independence  Independent    Vocation  On disability    Leisure  likes to read, listen to music, taking short trips.      ADL   Eating/Feeding  Modified independent    Grooming  Minimal assistance   hair care and stying   Upper Body Bathing  Minimal assistance    Lower Body Bathing  Modified independent    Upper Body Dressing  Increased time    Lower Body Dressing  Increased time    Toilet Transfer  Modified independent    Toileting - Clothing Manipulation  Modified independent    Toileting -  Hygiene  Modified Independent    Tub/Shower Transfer  Other (comment)   worried about getting out of the shower   ADL comments  She has foot drop on the right which affects her daily patterns.        IADL   Prior Level of Function Shopping  independent    Shopping  Assistance  for transportation;Needs to be accompanied on any shopping trip    Prior Level of Function Light Housekeeping  independent    Light Housekeeping  Performs light daily tasks such as dishwashing, bed making    Prior Level of Function Meal Prep  independent    Meal Prep  Plans, prepares and serves adequate meals independently    Prior Level of Function Community Mobility  independent    Community Mobility  Relies on family or friends for transportation    Prior Level of Function Medication Managment  independent    Medication Management  Takes responsibility if medication is prepared in advance in seperate dosage    Prior Level of Function Financial Management  independent    Physicist, medical financial matters independently (budgets, writes checks, pays rent, bills goes to bank), collects and keeps track of income      Mobility   Mobility Status  Needs assist;History of falls    Mobility Status Comments  ambulates without an assistive device but reports poor balance.      Written Expression   Dominant Hand  Right    Handwriting  75% legible      Vision - History   Baseline Vision  Wears glasses all the time      Cognition   Overall Cognitive Status  Within Functional Limits for tasks assessed    Cognition Comments  Patient describes herself as being "scatter brained"      Sensation   Light Touch  Appears Intact    Stereognosis  Appears Intact    Hot/Cold  Appears Intact    Proprioception  Appears Intact      Coordination   Gross Motor Movements are Fluid and Coordinated  No    Fine Motor Movements are Fluid and Coordinated  No    9 Hole Peg Test  Right;Left    Right 9 Hole Peg Test  20 secs    Left 9 Hole Peg Test  28 secs      ROM / Strength  AROM / PROM / Strength  AROM;Strength      AROM   Overall AROM Comments  Right shoulder flexion 100, left 98 degrees. ABD R 80 degrees, left 70 degrees. Full elbow extension/flexion, wrist flex/ext WNL.  Decreased  retraction of bilateral scapula.        Strength   Overall Strength Comments  strength limited in bilateral shoulders, elbow, wrist WNLs.        Hand Function   Right Hand Grip (lbs)  40    Right Hand Lateral Pinch  15 lbs    Right Hand 3 Point Pinch  11 lbs    Left Hand Grip (lbs)  34    Left Hand Lateral Pinch  9 lbs    Left 3 point pinch  8 lbs    Comment  2 point pinch r  6#, left 4#       Patient was diagnosed about 4-5 years ago with muscular dystrophy but has had more issues in the last year with decline in strength.   Started with foot drop, her mother also diagnosed, both went through genetic testing.   Burning sensation in both arms, starting around the elbows and travels up to the shoulders.  Usual onset at night or late in the day, does not Occur every day but frequent.   She reports she hates to shower and really likes baths but has not done a bath in a while since she is unsure of the transfer.   With shopping, she has to have a cart to shop and usually cannot shop more than 30 mins.   She has difficulty with laundry at times, she is unable to carry a heavy or large basket.    She has some difficulty with bed making.  She is still able to perform meal preparation but sits when needed.  Difficulty with repeated reaching patterns to place items in upper cabinets.                    OT Education - 04/16/18 0859    Education Details  Goals, plan of care, role of OT    Person(s) Educated  Patient    Methods  Explanation    Comprehension  Verbalized understanding          OT Long Term Goals - 04/16/18 [id number]      OT LONG TERM GOAL #1   Title  Patient will increase shoulder flexion bilaterally by 20 degrees of motion to assist with hair care and styling.      Baseline  limited strength and motion and requires assist for hair care at eval    Time  12    Period  Weeks    Status  New    Target Date  07/04/18      OT LONG TERM GOAL #2   Title   Patient will demonstrate kitchen activity in standing for up to 30 mins with less than 2 rest breaks.      Baseline  patient currently has to sit for kitchen task.     Time  12    Period  Weeks    Status  New    Target Date  07/04/18      OT LONG TERM GOAL #3   Title  Patient will complete tub transfer with supervision for safety and use of grab bars.     Baseline  unable at eval.     Time  12    Period  Weeks    Status  New    Target Date  07/04/18      OT LONG TERM GOAL #4   Title  Patient will increase left grip by 5# to assist with opening jars and containers.      Baseline  difficulty at eval and needs assist at times.     Time  8    Period  Weeks    Status  New    Target Date  06/06/18      OT LONG TERM GOAL #5   Title  Patient will demonstrate the ability to put away groceries in upper cabinets in standing with bilateral UE use and good balance.     Baseline  poor balance and decreased motion for reaching in higher cabiinets.     Time  12    Period  Weeks    Status  New    Target Date  07/04/18      Long Term Additional Goals   Additional Long Term Goals  Yes      OT LONG TERM GOAL #6   Title  Patient will complete laundry tasks with modified independence/modified techniques.     Baseline  difficulty with managing a basket, hanging clothes, reqiures rest breaks for tasks at eval    Time  8    Period  Weeks    Status  New    Target Date  06/06/18            Plan - 04/16/18 0907    Clinical Impression Statement  Patient is a 43 yo female with history of Facioscapulohumeral muscular dystrophy with recent decline in muscle weakness, coordination and balance affecting her daily activities.  She lives with 2 room mates in an apartment with a ramped entry. Patient was seen for OT evaluation and presents with muscle weakness, decreased coordination skills, decreased ROM proximally in bilateral shoulders, decreased balance, decreased toleration to activity and  decreased self care and IADL tasks.  She would benefit from skilled OT to maximize her safety and independence in daily tasks.      Occupational Profile and client history currently impacting functional performance  lives with room mates but needs to be independent with basic self care tasks, history of falls.      Occupational performance deficits (Please refer to evaluation for details):  ADL's;IADL's;Social Participation    Rehab Potential  Good    Current Impairments/barriers affecting progress:  decreased ROM/strength proximally UE, has some impaired sensation with burning in arms, foot drop    OT Frequency  2x / week    OT Duration  12 weeks    OT Treatment/Interventions  Self-care/ADL training;Cryotherapy;Therapeutic exercise;DME and/or AE instruction;Functional Mobility Training;Balance training;Neuromuscular education;Manual Therapy;Moist Heat;Energy conservation;Therapeutic activities;Patient/family education    Clinical Decision Making  Limited treatment options, no task modification necessary    Consulted and Agree with Plan of Care  Patient       Patient will benefit from skilled therapeutic intervention in order to improve the following deficits and impairments:  Abnormal gait, Decreased balance, Decreased endurance, Decreased mobility, Difficulty walking, Decreased range of motion, Pain, Decreased activity tolerance, Decreased coordination, Decreased strength, Impaired flexibility, Impaired UE functional use  Visit Diagnosis: Muscle weakness (generalized)  Other lack of coordination    Problem List Patient Active Problem List   Diagnosis Date Noted  . 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  . Anxiety and depression 10/01/2012  . Protein-calorie malnutrition, severe ([name]) 09/30/2012  . Alcohol abuse 09/26/2012  .  Trichomonas infection 09/26/2012  . Dehydration 09/26/2012  . Hypokalemia 11/14/2011  . Thrombocytopenia ([name]) 11/14/2011  . Alcohol  withdrawal ([name]) 11/13/2011  . Bipolar 2 disorder ([name]) 11/13/2011  . Tobacco abuse 11/13/2011  . Cocaine abuse ([name]) 11/13/2011  . Gout 11/13/2011   Willadeen Colantuono T Arne [place], OTR/L, CLT  Kina Shiffman 04/16/2018, 9:27 AM  [facility] MAIN [facility] SERVICES [address] [place], [place], [address] Phone: [phone]   Fax:  [phone]  Name: DORTHIE SANTINI MRN: [id number] Date of Birth: 01/24/1976

## 2018-04-14 ENCOUNTER — Encounter: Payer: Self-pay | Admitting: Physical Therapy

## 2018-04-14 ENCOUNTER — Ambulatory Visit: Payer: Medicare Other

## 2018-04-14 DIAGNOSIS — R2689 Other abnormalities of gait and mobility: Secondary | ICD-10-CM | POA: Diagnosis not present

## 2018-04-14 DIAGNOSIS — R2681 Unsteadiness on feet: Secondary | ICD-10-CM | POA: Diagnosis not present

## 2018-04-14 DIAGNOSIS — M6281 Muscle weakness (generalized): Secondary | ICD-10-CM

## 2018-04-14 DIAGNOSIS — R278 Other lack of coordination: Secondary | ICD-10-CM | POA: Diagnosis not present

## 2018-04-14 NOTE — Therapy (Signed)
[facility] MAIN [facility] SERVICES [address] [place], [place], [address] Phone: [phone]   Fax:  [phone]  Physical Therapy Treatment  Patient Details  Name: [name] MRN: [id number] Date of Birth: 09/11/1975 Referring Provider (PT): York Spaniel   Encounter Date: 04/14/2018  PT End of Session - 04/14/18 1214    Visit Number  2    Number of Visits  16    Date for PT Re-Evaluation  06/06/18    Authorization Type  2/10 eval on 1/24    PT Start Time  1019    PT Stop Time  1059    PT Time Calculation (min)  40 min    Equipment Utilized During Treatment  Gait belt    Activity Tolerance  Patient tolerated treatment well    Behavior During Therapy  WFL for tasks assessed/performed       Past Medical History:  Diagnosis Date  . Alcohol abuse   . Anxiety   . Anxiety   . Anxiety and depression   . Asthma   . Bipolar disorder ([name])   . Borderline personality disorder ([name])   . Depression   . Diabetes ([name])    Type 2   . Dyslipidemia   . Esophageal varices ([name])   . Foot drop    right  . 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[name])   . GERD (gastroesophageal reflux disease)   . Gout   . History of drug abuse ([name])   . Insomnia   . Muscular dystrophies ([name])   . Neuropathy   . Pancreatitis   . Recovering alcoholic [facility])     Past Surgical History:  Procedure Laterality Date  . HERNIA REPAIR      There were no vitals filed for this visit.  Subjective Assessment - 04/14/18 1020    Subjective  Patient reported that she fell yesterday, lost her footing, fell on carpet and skinned her elbow, did not hit her head.     Pertinent History  Ms. Kautz is a 43 year old right-handed white female with a history of obesity, diabetes, bipolar disorder, and a prior history of alcohol and cocaine abuse.  The patient has a history of FSH muscular dystrophy.  Patient has foot drop on R side for the past 7 years. Reports she has  braces for the ankles but not for the foot drop. The patient had initially noted some weakness in the lower extremities when she was in her late 58s.  The patient has progressed to have severe bilateral foot drops, she has AFO braces but does not wear them regularly.  The patient has developed some weakness of the arms, particularly with the deltoid muscles and has difficulty raising her arms up above her head.  The patient has fallen on occasion, she has fatigue of the legs when she has to walk longer distances such as in a store, she has to hold onto a cart in order to maintain the upright posture.  The patient does have significant low back pain when she is partially stoop with washing dishes.  Otherwise, she does not have back pain.  She has been trying to lose weight actively.  The patient does note some numbness in the toes, she may have some cramping in the feet at times.  She has some discomfort from the elbows to the shoulders on both arms.  The patient denies issues controlling the bowels or the bladder.Patient reports she loses her footing, does not depend  on time of the day or surfaces. Falls frequently, wants to learn how to get up from falling down.     Limitations  Lifting;Standing;Walking;House hold activities    How long can you sit comfortably?  n/a     How long can you stand comfortably?  1-2 minutes    How long can you walk comfortably?  15 minutes    Patient Stated Goals  walk more steady and for longer distances.     Currently in Pain?  Yes    Pain Score  2     Pain Location  Shoulder    Pain Orientation  Right;Left      TREATMENT: Therapeutic Exercises  Seated Toe Raise x20 with demo Seated Long Arc Quad 2x10 verbal cues for eccentric control  Hamstring curl with GTB x10 x 7 ea side with PT assist, verbal cues for eccentric control Bridges in supine 2x10 with verbal cues for form SLR in supine with opposite knee bent x10 bilaterally verbal/visual cues for exercise  form Transverse abdominis activation in supine, 10x3sec holds for core stability verbal/tactile cues for correct muscle activation, breathing hooklying hip abduction with GTB 2x10 verbal/tactile cues for form  Neuromuscular re-edu: Sit to stands x10 without UE support, close supervision cues for upright posture Standing on foam FT with horizontal and vertical head turns CGA x10 ea direction Standing on foam modified tandem 2x20sec hold bilaterally, CGA.  Pt tends to lose balance posteriorly, poor anterior tibialis/ankle strategy for balance. Would benefit from further education on compensational balance strategies.       PT Education - 04/14/18 1021    Education Details  exercise technique/form    Person(s) Educated  Patient    Methods  Explanation;Demonstration;Tactile cues    Comprehension  Verbalized understanding;Returned demonstration;Verbal cues required       PT Short Term Goals - 04/11/18 1200      PT SHORT TERM GOAL #1   Title  Patient will be independent in home exercise program to improve strength/mobility for better functional independence with ADLs.    Baseline  HEP given     Time  2    Period  Weeks    Status  New    Target Date  04/25/18      PT SHORT TERM GOAL #2   Title  Patient will deny any falls over past 2 weeks to demonstrate improved safety awareness at home and in her natural environment    Baseline  Larey SeatFell the other day     Time  2    Period  Weeks    Status  New    Target Date  04/25/18        PT Long Term Goals - 04/11/18 1200      PT LONG TERM GOAL #1   Title   Patient (< 42 years old) will complete five times sit to stand test in < 10 seconds without UE support indicating an increased LE strength and improved balance.    Baseline  1/24: 16 seconds with BUE support     Time  8    Period  Weeks    Status  New    Target Date  06/06/18      PT LONG TERM GOAL #2   Title  Patient will increase Berg Balance score by > 6 points (39/56) to  demonstrate decreased fall risk during functional activities.    Baseline  1/24: 33/56    Time  8    Period  Weeks    Status  New    Target Date  06/06/18      PT LONG TERM GOAL #3   Title  Patient will increase six minute walk test distance to >1000 for progression to community ambulator and improve gait ability    Baseline  1/24: 390 ft     Time  8    Period  Weeks    Status  New    Target Date  06/06/18      PT LONG TERM GOAL #4   Title  Patient will increase ABC scale score >80% to demonstrate better functional mobility and better confidence with ADLs.     Baseline  1/24: 40.6%    Time  8    Period  Weeks    Status  New    Target Date  06/06/18            Plan - 04/14/18 1209    Clinical Impression Statement  Patient responded well to session today, demonstrated fatigue with multijoint exercises. Mod verbal/visual cues needed for proper exercise technique/form. Patient most challenged by uneven surfaces and narrow base of support balance activities. The patient would benefit from further skilled PT to maximize functional abilities, strength, and gait.     Rehab Potential  Fair    Clinical Impairments Affecting Rehab Potential  (+) age, motivation (-) FSHD, depression/anxiety/bipolar. Chronicity of foot drop     PT Frequency  2x / week    PT Duration  8 weeks    PT Treatment/Interventions  ADLs/Self Care Home Management;Aquatic Therapy;Biofeedback;Cryotherapy;Electrical Stimulation;Iontophoresis 4mg /ml Dexamethasone;Moist Heat;Traction;Ultrasound;DME Instruction;Gait training;Therapeutic exercise;Therapeutic activities;Functional mobility training;Stair training;Balance training;Neuromuscular re-education;Patient/family education;Manual techniques;Orthotic Fit/Training;Compression bandaging;Passive range of motion;Vestibular;Canalith Repostioning;Taping;Splinting;Energy conservation;Dry needling    PT Next Visit Plan  review HEP, strengthen core and LE's, balance    PT Home  Exercise Plan  see above    Consulted and Agree with Plan of Care  Patient       Patient will benefit from skilled therapeutic intervention in order to improve the following deficits and impairments:  Abnormal gait, Decreased activity tolerance, Decreased coordination, Decreased balance, Decreased endurance, Decreased knowledge of use of DME, Decreased safety awareness, Decreased range of motion, Decreased mobility, Decreased strength, Difficulty walking, Hypomobility, Increased fascial restricitons, Impaired flexibility, Impaired perceived functional ability, Impaired sensation, Postural dysfunction, Improper body mechanics, Pain, Obesity  Visit Diagnosis: Other abnormalities of gait and mobility  Unsteadiness on feet  Muscle weakness (generalized)     Problem List Patient Active Problem List   Diagnosis Date Noted  . 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  . Anxiety and depression 10/01/2012  . Protein-calorie malnutrition, severe ([name]) 09/30/2012  . Alcohol abuse 09/26/2012  . Trichomonas infection 09/26/2012  . Dehydration 09/26/2012  . Hypokalemia 11/14/2011  . Thrombocytopenia ([name]) 11/14/2011  . Alcohol withdrawal ([name]) 11/13/2011  . Bipolar 2 disorder ([name]) 11/13/2011  . Tobacco abuse 11/13/2011  . Cocaine abuse ([name]) 11/13/2011  . Gout 11/13/2011    Olga Coaster PT, DPT 12:18 PM,04/14/18 [phone]  [facility] MAIN [facility] SERVICES [address] [place], [place], [address] Phone: [phone]   Fax:  [phone]  Name: [name] MRN: [id number] Date of Birth: Jan 02, 1976

## 2018-04-16 ENCOUNTER — Ambulatory Visit: Payer: Medicare Other | Admitting: Occupational Therapy

## 2018-04-16 ENCOUNTER — Encounter: Payer: Self-pay | Admitting: Occupational Therapy

## 2018-04-16 ENCOUNTER — Ambulatory Visit: Payer: Medicare Other

## 2018-04-16 DIAGNOSIS — R2681 Unsteadiness on feet: Secondary | ICD-10-CM | POA: Diagnosis not present

## 2018-04-16 DIAGNOSIS — M6281 Muscle weakness (generalized): Secondary | ICD-10-CM | POA: Diagnosis not present

## 2018-04-16 DIAGNOSIS — R278 Other lack of coordination: Secondary | ICD-10-CM

## 2018-04-16 DIAGNOSIS — R2689 Other abnormalities of gait and mobility: Secondary | ICD-10-CM

## 2018-04-16 NOTE — Therapy (Signed)
[place] [facility] [address] [place], [place], [address] Phone: [phone]   Fax:  [phone]  Occupational Therapy Treatment  Patient Details  Name: [name] MRN: [id number] Date of Birth: [birth year] No data recorded  Encounter Date: 04/16/2018  OT End of Session - 04/16/18 1621    Visit Number  2    Number of Visits  12    Date for OT Re-Evaluation  06/06/18    Authorization Type  [employer]     OT Start Time  0845    OT Stop Time  0930    OT Time Calculation (min)  45 min    Activity Tolerance  Patient tolerated treatment well    Behavior During Therapy  [facility] for tasks assessed/performed       Past Medical History:  Diagnosis Date  . Alcohol abuse   . Anxiety   . Anxiety   . Anxiety and depression   . Asthma   . Bipolar disorder ([name])   . Borderline personality disorder ([name])   . Depression   . Diabetes ([name])    Type 2   . Dyslipidemia   . Esophageal varices ([name])   . Foot drop    right  . 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[name])   . GERD (gastroesophageal reflux disease)   . Gout   . History of drug abuse ([name])   . Insomnia   . Muscular dystrophies ([name])   . Neuropathy   . Pancreatitis   . 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 HERNIA REPAIR      There were no vitals filed for this visit.  Subjective Assessment - 04/16/18 1620    Subjective   Pt. reports looking forward to watching the superbowl.    Pertinent History  Ms. [name] is a 43 year old right-handed white female with a history of obesity, diabetes, bipolar disorder, and a prior history of alcohol and cocaine abuse.  The patient has a history of FSH muscular dystrophy.  Patient has foot drop on R side for the past 7 years.     Patient Stated Goals  Patient reports she would like to get stronger and be as independent as possible.     Currently in Pain?  No/denies      OT TREATMENT     Neuro muscular re-education:  Pt. worked on bilateral hand grasping, and manipulating 1/2" washers from a magnetic dish using point grasp pattern. Pt. worked on reaching up, stabilizing, and sustaining shoulder elevation while placing the washer over a small precise target on vertical dowels positioned at various angles. Pt. performed [facility] tasks using the Grooved pegboard. Pt. worked on bilateral hand [facility] skills grasping the grooved pegs from a horizontal position, and moving the pegs to a vertical position in the hand to prepare for placing them in the grooved slot.   Therapeutic Exercise:  Pt. worked on ROM using the shape tower. Pt. worked on reaching to place the shapes over 3 rungs alternating right, and left hands, as well as both hands symmetrically together over the 4th rung.  Pt. performed 2# dowel ex. For UE strengthening secondary to weakness. Bilateral  chest press, circular patterns, and elbow flexion/extension were performed. 2# dumbbell ex. for elbow forearm supination/pronation, wrist flexion/extension, and radial deviation. Pt. requires rest breaks and verbal cues for proper technique.  OT Education - 04/16/18 1621    Education Details  UE functioning    Person(s) Educated  Patient    Methods  Explanation    Comprehension  Verbalized understanding          OT Long Term Goals - 04/16/18 0918      OT LONG TERM GOAL #1   Title  Patient will increase shoulder flexion bilaterally by 20 degrees of motion to assist with hair care and styling.      Baseline  limited strength and motion and requires assist for hair care at eval    Time  12    Period  Weeks    Status  New    Target Date  07/04/18      OT LONG TERM GOAL #2   Title  Patient will demonstrate kitchen activity in standing for up to 30 mins with less than 2 rest breaks.      Baseline  patient currently has to sit for kitchen task.     Time  12    Period  Weeks     Status  New    Target Date  07/04/18      OT LONG TERM GOAL #3   Title  Patient will complete tub transfer with supervision for safety and use of grab bars.     Baseline  unable at eval.     Time  12    Period  Weeks    Status  New    Target Date  07/04/18      OT LONG TERM GOAL #4   Title  Patient will increase left grip by 5# to assist with opening jars and containers.      Baseline  difficulty at eval and needs assist at times.     Time  8    Period  Weeks    Status  New    Target Date  06/06/18      OT LONG TERM GOAL #5   Title  Patient will demonstrate the ability to put away groceries in upper cabinets in standing with bilateral UE use and good balance.     Baseline  poor balance and decreased motion for reaching in higher cabiinets.     Time  12    Period  Weeks    Status  New    Target Date  07/04/18      Long Term Additional Goals   Additional Long Term Goals  Yes      OT LONG TERM GOAL #6   Title  Patient will complete laundry tasks with modified independence/modified techniques.     Baseline  difficulty with managing a basket, hanging clothes, reqiures rest breaks for tasks at eval    Time  8    Period  Weeks    Status  New    Target Date  06/06/18            Plan - 04/16/18 1622    Clinical Impression Statement  Pt. reports she has lost 9 pounds recently from starting a diet provided by her physician that focuses on counting calories. Pt. continues to work on improving BUE strength, and [facility] skills in order to improve  her ability to perform haircare with UEs sustained in elevation, and perform ADLs, and IADLs     Occupational Profile and client history currently impacting functional performance  lives with room mates but needs to be independent with basic self care tasks, history of falls.  Occupational performance deficits (Please refer to evaluation for details):  ADL's;IADL's;Social Participation    Rehab Potential  Good    Current  Impairments/barriers affecting progress:  decreased ROM/strength proximally UE, has some impaired sensation with burning in arms, foot drop    OT Frequency  2x / week    OT Duration  12 weeks    OT Treatment/Interventions  Self-care/ADL training;Cryotherapy;Therapeutic exercise;DME and/or AE instruction;Functional Mobility Training;Balance training;Neuromuscular education;Manual Therapy;Moist Heat;Energy conservation;Therapeutic activities;Patient/family education    Clinical Decision Making  Limited treatment options, no task modification necessary    Consulted and Agree with Plan of Care  Patient       Patient will benefit from skilled therapeutic intervention in order to improve the following deficits and impairments:  Abnormal gait, Decreased balance, Decreased endurance, Decreased mobility, Difficulty walking, Decreased range of motion, Pain, Decreased activity tolerance, Decreased coordination, Decreased strength, Impaired flexibility, Impaired UE functional use  Visit Diagnosis: Muscle weakness (generalized)  Other lack of coordination    Problem List Patient Active Problem List   Diagnosis Date Noted  . 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  . Anxiety and depression 10/01/2012  . Protein-calorie malnutrition, severe ([name]) 09/30/2012  . Alcohol abuse 09/26/2012  . Trichomonas infection 09/26/2012  . Dehydration 09/26/2012  . Hypokalemia 11/14/2011  . Thrombocytopenia ([name]) 11/14/2011  . Alcohol withdrawal ([name]) 11/13/2011  . Bipolar 2 disorder ([name]) 11/13/2011  . Tobacco abuse 11/13/2011  . Cocaine abuse ([name]) 11/13/2011  . Gout 11/13/2011    Olegario Messier, MS, OTR/L 04/16/2018, 4:28 PM  [place] [facility] MAIN [facility] SERVICES [address] [place], [place], [address] Phone: [phone]   Fax:  [phone]  Name: [name] MRN: [id number] Date of Birth: [birth year]

## 2018-04-16 NOTE — Therapy (Signed)
[place] [facility] [address] [place], [place], [address] Phone: [phone]   Fax:  [phone]  Physical Therapy Treatment  Patient Details  Name: [name] MRN: [id number] Date of Birth: October 19, 1975 Referring Provider (PT): York Spanielcharles K willis   Encounter Date: 04/16/2018  PT End of Session - 04/16/18 1033    Visit Number  3    Number of Visits  16    Date for PT Re-Evaluation  06/06/18    Authorization Type  3/10 eval on 1/24    PT Start Time  0932    PT Stop Time  1015    PT Time Calculation (min)  43 min    Equipment Utilized During Treatment  Gait belt    Activity Tolerance  Patient tolerated treatment well    Behavior During Therapy  WFL for tasks assessed/performed       Past Medical History:  Diagnosis Date  . Alcohol abuse   . Anxiety   . Anxiety   . Anxiety and depression   . Asthma   . Bipolar disorder ([name])   . Borderline personality disorder ([name])   . Depression   . Diabetes ([name])    Type 2   . Dyslipidemia   . Esophageal varices ([name])   . Foot drop    right  . 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[name])   . GERD (gastroesophageal reflux disease)   . Gout   . History of drug abuse ([name])   . Insomnia   . Muscular dystrophies ([name])   . Neuropathy   . Pancreatitis   . Recovering alcoholic [facility]([name])     Past Surgical History:  Procedure Laterality Date  . HERNIA REPAIR      There were no vitals filed for this visit.  Subjective Assessment - 04/16/18 1027    Subjective  Patient reports her knee still is occasionally bothering her since her stumble over the weekend. Is not really having pain at this time, just twinges occasionally.     Pertinent History  [name] is a 43 year old right-handed white female with a history of obesity, diabetes, bipolar disorder, and a prior history of alcohol and cocaine abuse.  The patient has a history of FSH muscular dystrophy.  Patient has foot drop on  R side for the past 7 years. Reports she has braces for the ankles but not for the foot drop. The patient had initially noted some weakness in the lower extremities when she was in her late 5420s.  The patient has progressed to have severe bilateral foot drops, she has AFO braces but does not wear them regularly.  The patient has developed some weakness of the arms, particularly with the deltoid muscles and has difficulty raising her arms up above her head.  The patient has fallen on occasion, she has fatigue of the legs when she has to walk longer distances such as in a store, she has to hold onto a cart in order to maintain the upright posture.  The patient does have significant low back pain when she is partially stoop with washing dishes.  Otherwise, she does not have back pain.  She has been trying to lose weight actively.  The patient does note some numbness in the toes, she may have some cramping in the feet at times.  She has some discomfort from the elbows to the shoulders on both arms.  The patient denies issues controlling the bowels or the bladder.Patient reports she loses  her footing, does not depend on time of the day or surfaces. Falls frequently, wants to learn how to get up from falling down.     Limitations  Lifting;Standing;Walking;House hold activities    How long can you sit comfortably?  n/a     How long can you stand comfortably?  1-2 minutes    How long can you walk comfortably?  15 minutes    Patient Stated Goals  walk more steady and for longer distances.     Currently in Pain?  No/denies        -BTB df assist wrap on for all exercises. Wrapped bilateral ankles: (noted improvements of stability, upright posture, and ambulation)    Therapeutic Exercises  Seated Long Arc Quad 2x10 verbal cues for eccentric control   Bridges in supine 2x10 with verbal cues for form  Transverse abdominis activation in supine, 10x3sec holds for core stability verbal/tactile cues for correct  muscle activation, breathing   ambulate with df assist wrap:: improved stability noted. CGA 2x 86 ft, Increased clearance of bilateral feet, upright trunk posture, decreased episodes of scissoring, increase step length bilaterally. Patient does not have to look at feet to ambulate when using df assist.   Marching with half foam roller between feet: challenging initially due to preference for narrow BOS, however improved with repetition, CGA for stability with BUE support.   Ankle df pf rocker 20x seated, fatigue of ankles noted with repetition   Neuromuscular re-edu: CGA due to limited ankle reaction response.  airex pad: eyes open 2x 30 seconds; improved stability with ankle df assist.   airex pad: eyes closed 30 seconds one finger support. Patient very fearful   Step over and back half foam roller SUE support. Initially fearful however improved with coordination and stability with repetition.   Balloon taps reaching inside and outside BOS inside // bars. CGA  No episodes of LOB   Pt tends to lose balance posteriorly, poor anterior tibialis/ankle strategy for balance. Would benefit from further education on compensational balance strategies.                    PT Education - 04/16/18 1033    Education Details  exercise technique/form, df assist     Person(s) Educated  Patient    Methods  Explanation;Demonstration;Tactile cues;Verbal cues    Comprehension  Verbalized understanding;Returned demonstration;Tactile cues required;Need further instruction;Verbal cues required       PT Short Term Goals - 04/11/18 1200      PT SHORT TERM GOAL #1   Title  Patient will be independent in home exercise program to improve strength/mobility for better functional independence with ADLs.    Baseline  HEP given     Time  2    Period  Weeks    Status  New    Target Date  04/25/18      PT SHORT TERM GOAL #2   Title  Patient will deny any falls over past 2 weeks to demonstrate  improved safety awareness at home and in her natural environment    Baseline  Larey Seat the other day     Time  2    Period  Weeks    Status  New    Target Date  04/25/18        PT Long Term Goals - 04/11/18 1200      PT LONG TERM GOAL #1   Title   Patient (< 10 years old) will complete five times  sit to stand test in < 10 seconds without UE support indicating an increased LE strength and improved balance.    Baseline  1/24: 16 seconds with BUE support     Time  8    Period  Weeks    Status  New    Target Date  06/06/18      PT LONG TERM GOAL #2   Title  Patient will increase Berg Balance score by > 6 points (39/56) to demonstrate decreased fall risk during functional activities.    Baseline  1/24: 33/56    Time  8    Period  Weeks    Status  New    Target Date  06/06/18      PT LONG TERM GOAL #3   Title  Patient will increase six minute walk test distance to >1000 for progression to community ambulator and improve gait ability    Baseline  1/24: 390 ft     Time  8    Period  Weeks    Status  New    Target Date  06/06/18      PT LONG TERM GOAL #4   Title  Patient will increase ABC scale score >80% to demonstrate better functional mobility and better confidence with ADLs.     Baseline  1/24: 40.6%    Time  8    Period  Weeks    Status  New    Target Date  06/06/18            Plan - 04/16/18 1034    Clinical Impression Statement  Patient demonstrated improved stability, mobility, and confidence when wearing ankle df assist (blue TB wrap). Her mobility was smoother with less episodes of scissoring and LOB and her ability to utilize ankle reactions was enhanced. Due to this, this patient would benefit from dorsiflexion assist orthotic/AFO. Patient will benefit from skilled physical therapy to increase strength of LE's and core for postural control and mobility, increase righting reactions and stability, and decrease risk of falling for improved quality of life.     Rehab  Potential  Fair    Clinical Impairments Affecting Rehab Potential  (+) age, motivation (-) FSHD, depression/anxiety/bipolar. Chronicity of foot drop     PT Frequency  2x / week    PT Duration  8 weeks    PT Treatment/Interventions  ADLs/Self Care Home Management;Aquatic Therapy;Biofeedback;Cryotherapy;Electrical Stimulation;Iontophoresis 4mg /ml Dexamethasone;Moist Heat;Traction;Ultrasound;DME Instruction;Gait training;Therapeutic exercise;Therapeutic activities;Functional mobility training;Stair training;Balance training;Neuromuscular re-education;Patient/family education;Manual techniques;Orthotic Fit/Training;Compression bandaging;Passive range of motion;Vestibular;Canalith Repostioning;Taping;Splinting;Energy conservation;Dry needling    PT Next Visit Plan  review HEP, strengthen core and LE's, balance    PT Home Exercise Plan  see above    Consulted and Agree with Plan of Care  Patient       Patient will benefit from skilled therapeutic intervention in order to improve the following deficits and impairments:  Abnormal gait, Decreased activity tolerance, Decreased coordination, Decreased balance, Decreased endurance, Decreased knowledge of use of DME, Decreased safety awareness, Decreased range of motion, Decreased mobility, Decreased strength, Difficulty walking, Hypomobility, Increased fascial restricitons, Impaired flexibility, Impaired perceived functional ability, Impaired sensation, Postural dysfunction, Improper body mechanics, Pain, Obesity  Visit Diagnosis: Muscle weakness (generalized)  Other lack of coordination  Other abnormalities of gait and mobility  Unsteadiness on feet     Problem List Patient Active Problem List   Diagnosis Date Noted  . 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  . Anxiety and depression 10/01/2012  . Protein-calorie malnutrition, severe ([name])  09/30/2012  . Alcohol abuse 09/26/2012  . Trichomonas infection 09/26/2012  .  Dehydration 09/26/2012  . Hypokalemia 11/14/2011  . Thrombocytopenia ([name]) 11/14/2011  . Alcohol withdrawal ([name]) 11/13/2011  . Bipolar 2 disorder ([name]) 11/13/2011  . Tobacco abuse 11/13/2011  . Cocaine abuse ([name]) 11/13/2011  . Gout 11/13/2011   Precious BardMarina Adyn Serna, PT, DPT   04/16/2018, 10:35 AM  [place] [facility] [address] [place], [place], [address] Phone: [phone]   Fax:  [phone]  Name: [name] MRN: [id number] Date of Birth: 12-May-1975

## 2018-04-21 ENCOUNTER — Ambulatory Visit: Payer: Medicare Other | Admitting: Occupational Therapy

## 2018-04-21 ENCOUNTER — Encounter: Payer: Self-pay | Admitting: Occupational Therapy

## 2018-04-21 ENCOUNTER — Ambulatory Visit: Payer: Medicare Other | Attending: Neurology

## 2018-04-21 DIAGNOSIS — R278 Other lack of coordination: Secondary | ICD-10-CM | POA: Diagnosis not present

## 2018-04-21 DIAGNOSIS — M6281 Muscle weakness (generalized): Secondary | ICD-10-CM | POA: Diagnosis not present

## 2018-04-21 DIAGNOSIS — R2689 Other abnormalities of gait and mobility: Secondary | ICD-10-CM | POA: Diagnosis not present

## 2018-04-21 DIAGNOSIS — R2681 Unsteadiness on feet: Secondary | ICD-10-CM | POA: Insufficient documentation

## 2018-04-21 NOTE — Therapy (Signed)
[facility] [address] [place], [place], [address] Phone: [phone]   Fax:  [phone]  Physical Therapy Treatment  Patient Details  Name: [name] MRN: [id number] Date of Birth: 05-17-1975 Referring Provider (PT): York Spanielcharles K willis   Encounter Date: 04/21/2018  PT End of Session - 04/21/18 0807    Visit Number  4    Number of Visits  16    Date for PT Re-Evaluation  06/06/18    Authorization Type  4/10 eval on 1/24    PT Start Time  0800    PT Stop Time  0845    PT Time Calculation (min)  45 min    Equipment Utilized During Treatment  Gait belt    Activity Tolerance  Patient tolerated treatment well    Behavior During Therapy  WFL for tasks assessed/performed       Past Medical History:  Diagnosis Date  . Alcohol abuse   . Anxiety   . Anxiety   . Anxiety and depression   . Asthma   . Bipolar disorder ([name])   . Borderline personality disorder ([name])   . Depression   . Diabetes ([name])    Type 2   . Dyslipidemia   . Esophageal varices ([name])   . Foot drop    right  . 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[name])   . GERD (gastroesophageal reflux disease)   . Gout   . History of drug abuse ([name])   . Insomnia   . Muscular dystrophies ([name])   . Neuropathy   . Pancreatitis   . 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 HERNIA REPAIR      There were no vitals filed for this visit.  Subjective Assessment - 04/21/18 0804    Subjective  Patient reports her right foot is hurting since yesterday when she was walking barefoot. Reports it feels like a plantar wart is forming. Has not done the HEP this weekend due to having friends in town.     Pertinent History  Ms. [name] is a 43 year old right-handed white female with a history of obesity, diabetes, bipolar disorder, and a prior history of alcohol and cocaine abuse.  The patient has a history of FSH muscular  dystrophy.  Patient has foot drop on R side for the past 7 years. Reports she has braces for the ankles but not for the foot drop. The patient had initially noted some weakness in the lower extremities when she was in her late 7620s.  The patient has progressed to have severe bilateral foot drops, she has AFO braces but does not wear them regularly.  The patient has developed some weakness of the arms, particularly with the deltoid muscles and has difficulty raising her arms up above her head.  The patient has fallen on occasion, she has fatigue of the legs when she has to walk longer distances such as in a store, she has to hold onto a cart in order to maintain the upright posture.  The patient does have significant low back pain when she is partially stoop with washing dishes.  Otherwise, she does not have back pain.  She has been trying to lose weight actively.  The patient does note some numbness in the toes, she may have some cramping in the feet at times.  She has some discomfort from the elbows to the shoulders on both arms.  The patient denies  issues controlling the bowels or the bladder.Patient reports she loses her footing, does not depend on time of the day or surfaces. Falls frequently, wants to learn how to get up from falling down.     Limitations  Lifting;Standing;Walking;House hold activities    How long can you sit comfortably?  n/a     How long can you stand comfortably?  1-2 minutes    How long can you walk comfortably?  15 minutes    Patient Stated Goals  walk more steady and for longer distances.     Currently in Pain?  Yes    Pain Score  6     Pain Location  Foot    Pain Orientation  Right    Pain Descriptors / Indicators  Aching    Pain Type  Chronic pain    Pain Onset  More than a month ago    Pain Frequency  Constant        -BTB df assist wrap on for all exercises. Wrapped bilateral ankles: (noted improvements of stability, upright posture, and ambulation)   Therapeutic  Exercises    Transverse abdominis activation in seated, 10x3sec holds for core stability verbal/tactile cues for correct muscle activation, breathing    ambulate with df assist wrap:: improved stability noted. CGA 2x 86 ft, Increased clearance of bilateral feet, upright trunk posture, decreased episodes of scissoring, increase step length bilaterally. Patient does not have to look at feet to ambulate when using df assist.   Ambulate in // bars with 2x4 between feet to promote wider BOS.  challenging initially due to preference for narrow BOS, however improved with repetition, CGA for stability with BUE support.    Ankle df pf rocker 20x seated, fatigue of ankles noted with repetition    Neuromuscular re-edu: CGA due to limited ankle reaction response.   airex pad: eyes closed 30 seconds one finger support. Patient very fearful    Step over and back half foam roller SUE support. Initially fearful however improved with coordination and stability with repetition.    Balloon taps reaching inside and outside BOS inside // bars. CGA  No episodes of LOB   Weight shift without df assist BUE support, CGA 2 minutes.  Verbal cueing for positioning. Transitioned into partial side squat   One foot on airex pad. Put more weight on side that's on the airex pad for ankle stability. CGA.    Pt tends to lose balance posteriorly, poor anterior tibialis/ankle strategy for balance. Would benefit from further education on compensational balance strategies.                          PT Education - 04/21/18 0806    Education Details  exercise technique/form df assist.     Person(s) Educated  Patient    Methods  Explanation;Demonstration;Tactile cues;Verbal cues    Comprehension  Verbalized understanding;Returned demonstration;Verbal cues required;Tactile cues required       PT Short Term Goals - 04/11/18 1200      PT SHORT TERM GOAL #1   Title  Patient will be independent in home  exercise program to improve strength/mobility for better functional independence with ADLs.    Baseline  HEP given     Time  2    Period  Weeks    Status  New    Target Date  04/25/18      PT SHORT TERM GOAL #2   Title  Patient will deny  any falls over past 2 weeks to demonstrate improved safety awareness at home and in her natural environment    Baseline  [name] the other day     Time  2    Period  Weeks    Status  New    Target Date  04/25/18        PT Long Term Goals - 04/11/18 1200      PT LONG TERM GOAL #1   Title   Patient (< 13 years old) will complete five times sit to stand test in < 10 seconds without UE support indicating an increased LE strength and improved balance.    Baseline  1/24: 16 seconds with BUE support     Time  8    Period  Weeks    Status  New    Target Date  06/06/18      PT LONG TERM GOAL #2   Title  Patient will increase Berg Balance score by > 6 points (39/56) to demonstrate decreased fall risk during functional activities.    Baseline  1/24: 33/56    Time  8    Period  Weeks    Status  New    Target Date  06/06/18      PT LONG TERM GOAL #3   Title  Patient will increase six minute walk test distance to >1000 for progression to community ambulator and improve gait ability    Baseline  1/24: 390 ft     Time  8    Period  Weeks    Status  New    Target Date  06/06/18      PT LONG TERM GOAL #4   Title  Patient will increase ABC scale score >80% to demonstrate better functional mobility and better confidence with ADLs.     Baseline  1/24: 40.6%    Time  8    Period  Weeks    Status  New    Target Date  06/06/18            Plan - 04/21/18 0818    Clinical Impression Statement  Patient presents to session fatigued due to staying up late from superbowl the night prior and having friends in town visiting. Poor coordination due to fatigue noted with difficulty maintaining task orientation. Ankle DF assist resulted in improved stability and  mobility with decreased instability. Df assist script given to patient for patient to make appointment at orthotic clinic.  Patient will benefit from skilled physical therapy to increase strength of LE's and core for postural control and mobility, increase righting reactions and stability, and decrease risk of falling for improved quality of life.     Rehab Potential  Fair    Clinical Impairments Affecting Rehab Potential  (+) age, motivation (-) FSHD, depression/anxiety/bipolar. Chronicity of foot drop     PT Frequency  2x / week    PT Duration  8 weeks    PT Treatment/Interventions  ADLs/Self Care Home Management;Aquatic Therapy;Biofeedback;Cryotherapy;Electrical Stimulation;Iontophoresis 4mg /ml Dexamethasone;Moist Heat;Traction;Ultrasound;DME Instruction;Gait training;Therapeutic exercise;Therapeutic activities;Functional mobility training;Stair training;Balance training;Neuromuscular re-education;Patient/family education;Manual techniques;Orthotic Fit/Training;Compression bandaging;Passive range of motion;Vestibular;Canalith Repostioning;Taping;Splinting;Energy conservation;Dry needling    PT Next Visit Plan  review HEP, strengthen core and LE's, balance    PT Home Exercise Plan  see above    Consulted and Agree with Plan of Care  Patient       Patient will benefit from skilled therapeutic intervention in order to improve the following deficits and impairments:  Abnormal gait,  Decreased activity tolerance, Decreased coordination, Decreased balance, Decreased endurance, Decreased knowledge of use of DME, Decreased safety awareness, Decreased range of motion, Decreased mobility, Decreased strength, Difficulty walking, Hypomobility, Increased fascial restricitons, Impaired flexibility, Impaired perceived functional ability, Impaired sensation, Postural dysfunction, Improper body mechanics, Pain, Obesity  Visit Diagnosis: Muscle weakness (generalized)  Other lack of coordination  Other  abnormalities of gait and mobility  Unsteadiness on feet     Problem List Patient Active Problem List   Diagnosis Date Noted  . 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  . Anxiety and depression 10/01/2012  . Protein-calorie malnutrition, severe ([name]) 09/30/2012  . Alcohol abuse 09/26/2012  . Trichomonas infection 09/26/2012  . Dehydration 09/26/2012  . Hypokalemia 11/14/2011  . Thrombocytopenia ([name]) 11/14/2011  . Alcohol withdrawal ([name]) 11/13/2011  . Bipolar 2 disorder ([name]) 11/13/2011  . Tobacco abuse 11/13/2011  . Cocaine abuse ([name]) 11/13/2011  . Gout 11/13/2011   Precious Bard, PT, DPT   04/21/2018, 8:45 AM  [place] [facility] MAIN [facility] SERVICES [address] [place], [place], [address] Phone: [phone]   Fax:  [phone]  Name: [name] MRN: [id number] Date of Birth: 1975-06-24

## 2018-04-21 NOTE — Therapy (Signed)
[place] [facility] MAIN [facility] SERVICES [address] [place], [place], [address] Phone: [phone]   Fax:  [phone]  Occupational Therapy Treatment  Patient Details  Name: [name] MRN: [id number] Date of Birth: [birth year] No data recorded  Encounter Date: 04/21/2018  OT End of Session - 04/21/18 0857    Visit Number  3    Number of Visits  12    Date for OT Re-Evaluation  06/06/18    Authorization Type  [employer]     OT Start Time  0850    OT Stop Time  0935    OT Time Calculation (min)  45 min    Activity Tolerance  Patient tolerated treatment well    Behavior During Therapy  [facility] for tasks assessed/performed       Past Medical History:  Diagnosis Date  . Alcohol abuse   . Anxiety   . Anxiety   . Anxiety and depression   . Asthma   . Bipolar disorder ([name])   . Borderline personality disorder ([name])   . Depression   . Diabetes ([name])    Type 2   . Dyslipidemia   . Esophageal varices ([name])   . Foot drop    right  . 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[name])   . GERD (gastroesophageal reflux disease)   . Gout   . History of drug abuse ([name])   . Insomnia   . Muscular dystrophies ([name])   . Neuropathy   . Pancreatitis   . Recovering alcoholic [facility])     Past Surgical History:  Procedure Laterality Date  . HERNIA REPAIR      There were no vitals filed for this visit.  Subjective Assessment - 04/21/18 0856    Subjective   Pt. reports that she is hungover from watching the Superbowl.    Pertinent History  [name] is a 43 year old right-handed white female with a history of obesity, diabetes, bipolar disorder, and a prior history of alcohol and cocaine abuse.  The patient has a history of FSH muscular dystrophy.  Patient has foot drop on R side for the past 7 years.     Patient Stated Goals  Patient reports she would like to get stronger and be as independent as possible.     Currently in Pain?  Yes      OT TREATMENT     Neuro muscular re-education:  Pt. worked on grasping, flipping and stacking 2" large pegs on the Instructo board placed at a tabletop surface, With bilateral hands. Pt. performed [facility] tasks using the Grooved pegboard. Pt. worked on grasping the grooved pegs from a horizontal position, and moving the pegs to a vertical position in the hand to prepare for placing them in the grooved slot.   Therapeutic Exercise:  Pt. performed 2# dowel ex. For UE strengthening secondary to weakness. Bilateral chest press, circular patterns, and elbow flexion/extension were performed. 3# dumbbell ex. for elbow flexion and extension, 2# for forearm supination/pronation, and wrist flexion/extension. Pt. requires rest breaks and verbal cues for proper technique. 2 sets 10 reps each. Pt. Performed hand strengthening with green theraputty. Pt. required cues for proper technique. Pt. worked on gross grip, lateral pinch, 3pt. pinch, and thumb opposition. Pt. required verbal and tactile cues for proper technique.                         OT Education - 04/21/18 0857    Education Details  UE functioning    Person(s) Educated  Patient    Methods  Explanation    Comprehension  Verbalized understanding          OT Long Term Goals - 04/16/18 [id number]      OT LONG TERM GOAL #1   Title  Patient will increase shoulder flexion bilaterally by 20 degrees of motion to assist with hair care and styling.      Baseline  limited strength and motion and requires assist for hair care at eval    Time  12    Period  Weeks    Status  New    Target Date  07/04/18      OT LONG TERM GOAL #2   Title  Patient will demonstrate kitchen activity in standing for up to 30 mins with less than 2 rest breaks.      Baseline  patient currently has to sit for kitchen task.     Time  12    Period  Weeks    Status  New    Target Date  07/04/18      OT LONG TERM GOAL #3   Title  Patient will complete tub transfer with  supervision for safety and use of grab bars.     Baseline  unable at eval.     Time  12    Period  Weeks    Status  New    Target Date  07/04/18      OT LONG TERM GOAL #4   Title  Patient will increase left grip by 5# to assist with opening jars and containers.      Baseline  difficulty at eval and needs assist at times.     Time  8    Period  Weeks    Status  New    Target Date  06/06/18      OT LONG TERM GOAL #5   Title  Patient will demonstrate the ability to put away groceries in upper cabinets in standing with bilateral UE use and good balance.     Baseline  poor balance and decreased motion for reaching in higher cabiinets.     Time  12    Period  Weeks    Status  New    Target Date  07/04/18      Long Term Additional Goals   Additional Long Term Goals  Yes      OT LONG TERM GOAL #6   Title  Patient will complete laundry tasks with modified independence/modified techniques.     Baseline  difficulty with managing a basket, hanging clothes, reqiures rest breaks for tasks at eval    Time  8    Period  Weeks    Status  New    Target Date  06/06/18            Plan - 04/21/18 0858    Clinical Impression Statement Pt. reports going to bead at 6:00 this morning, and waking up at 6:45 after watching the Superbowl last night, and partying all night drinking [place]. Pt. reports fatigue, and required restbreaks during the session.  Pt. continues to present with UE weakness duirng ADLs, and IADL tasks., and worked on improving UE strength to improve overall ADL, and IADL functioning.     Occupational performance deficits (Please refer to evaluation for details):  ADL's;IADL's;Social Participation    Rehab Potential  Good    Current Impairments/barriers affecting progress:  decreased ROM/strength proximally UE, has  some impaired sensation with burning in arms, foot drop    OT Frequency  2x / week    OT Duration  12 weeks    OT Treatment/Interventions  Self-care/ADL  training;Cryotherapy;Therapeutic exercise;DME and/or AE instruction;Functional Mobility Training;Balance training;Neuromuscular education;Manual Therapy;Moist Heat;Energy conservation;Therapeutic activities;Patient/family education    Clinical Decision Making  Limited treatment options, no task modification necessary    Consulted and Agree with Plan of Care  Patient       Patient will benefit from skilled therapeutic intervention in order to improve the following deficits and impairments:  Abnormal gait, Decreased balance, Decreased endurance, Decreased mobility, Difficulty walking, Decreased range of motion, Pain, Decreased activity tolerance, Decreased coordination, Decreased strength, Impaired flexibility, Impaired UE functional use  Visit Diagnosis: Muscle weakness (generalized)  Other lack of coordination    Problem List Patient Active Problem List   Diagnosis Date Noted  . 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  . Anxiety and depression 10/01/2012  . Protein-calorie malnutrition, severe ([name]) 09/30/2012  . Alcohol abuse 09/26/2012  . Trichomonas infection 09/26/2012  . Dehydration 09/26/2012  . Hypokalemia 11/14/2011  . Thrombocytopenia ([name]) 11/14/2011  . Alcohol withdrawal ([name]) 11/13/2011  . Bipolar 2 disorder ([name]) 11/13/2011  . Tobacco abuse 11/13/2011  . Cocaine abuse ([name]) 11/13/2011  . Gout 11/13/2011    Olegario Messier, MS, OTR/L 04/21/2018, 9:10 AM  [place] [facility] MAIN [facility] SERVICES [address] [place], [place], [address] Phone: [phone]   Fax:  [phone]  Name: [name] MRN: [id number] Date of Birth: 23-Jun-1975

## 2018-04-22 DIAGNOSIS — R21 Rash and other nonspecific skin eruption: Secondary | ICD-10-CM | POA: Diagnosis not present

## 2018-04-23 ENCOUNTER — Ambulatory Visit: Payer: Medicare Other | Admitting: Occupational Therapy

## 2018-04-23 ENCOUNTER — Encounter: Payer: Self-pay | Admitting: Occupational Therapy

## 2018-04-23 ENCOUNTER — Ambulatory Visit: Payer: Medicare Other

## 2018-04-23 DIAGNOSIS — R278 Other lack of coordination: Secondary | ICD-10-CM

## 2018-04-23 DIAGNOSIS — R2689 Other abnormalities of gait and mobility: Secondary | ICD-10-CM

## 2018-04-23 DIAGNOSIS — R2681 Unsteadiness on feet: Secondary | ICD-10-CM

## 2018-04-23 DIAGNOSIS — M6281 Muscle weakness (generalized): Secondary | ICD-10-CM

## 2018-04-23 NOTE — Therapy (Signed)
[place] [facility] [address] [place], [place], [address] Phone: [phone]   Fax:  [phone]  Occupational Therapy Treatment  Patient Details  Name: Kellie RilingRyan N Dixon MRN: [id number] Date of Birth: [birth year] No data recorded  Encounter Date: 04/23/2018  OT End of Session - 04/23/18 1058    Visit Number  4    Number of Visits  12    Date for OT Re-Evaluation  06/06/18    Authorization Type  [employer]     Activity Tolerance  Patient tolerated treatment well    Behavior During Therapy  [facility] for tasks assessed/performed       Past Medical History:  Diagnosis Date  . Alcohol abuse   . Anxiety   . Anxiety   . Anxiety and depression   . Asthma   . Bipolar disorder ([name])   . Borderline personality disorder ([name])   . Depression   . Diabetes ([name])    Type 2   . Dyslipidemia   . Esophageal varices ([name])   . Foot drop    right  . 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[name])   . GERD (gastroesophageal reflux disease)   . Gout   . History of drug abuse ([name])   . Insomnia   . Muscular dystrophies ([name])   . Neuropathy   . Pancreatitis   . 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 HERNIA REPAIR      There were no vitals filed for this visit.  Subjective Assessment - 04/23/18 1054    Subjective   Pt. reports she was able to sleep Monday after therapy, and feels much better.    Pertinent History  [name] is a 43 year old right-handed white female with a history of obesity, diabetes, bipolar disorder, and a prior history of alcohol and cocaine abuse.  The patient has a history of FSH muscular dystrophy.  Patient has foot drop on R side for the past 7 years.     Patient Stated Goals  Patient reports she would like to get stronger and be as independent as possible.     Currently in Pain?  No/denies      OT TREATMENT    Neuro muscular re-education:  Pt. worked on tasks to  sustain right, and left lateral pinch on resistive tweezers while grasping and moving 2" toothpick sticks from a horizontal flat position to a vertical position in order to place it in the holder. Pt. was able to sustain grasp while positioning and extending the wrist/hand in the necessary alignment needed to place the stick through the top of the holder. Pt. performed [facility] skills training to improve speed and dexterity needed for ADL tasks and writing. Pt. demonstrated grasping 1 inch sticks,  inch cylindrical collars, and  inch flat washers on the Purdue pegboard. Pt. performed grasping the sticks item with her 2nd digit and thumb, and storing them in the palm. Pt. worked on these tasks in order to be able [employer] Corporationmanipulate objects for [employer], and [employer].  Therapeutic Exercise:  Pt. performed right, and left gross gripping with grip strengthener. Pt. worked on sustaining grip while grasping pegs and reaching at various heights. Gripper was placed in the resistive slot with the white resistive spring. Pt. Worked on pinch strengthening in the left, and right hands for lateral, and 3pt. pinch using yellow, red, green, and blue resistive clips. Pt. worked on placing the clips at various vertical and  horizontal angles. Tactile and verbal cues were required for eliciting the desired movement. Pt. Worked on these tasks in preparation for being able to open containers.                        OT Education - 04/23/18 1058    Education Details  [facility] skills    Person(s) Educated  Patient    Methods  Explanation    Comprehension  Verbalized understanding          OT Long Term Goals - 04/16/18 0918      OT LONG TERM GOAL #1   Title  Patient will increase shoulder flexion bilaterally by 20 degrees of motion to assist with hair care and styling.      Baseline  limited strength and motion and requires assist for hair care at eval    Time  12    Period  Weeks    Status  New    Target Date   07/04/18      OT LONG TERM GOAL #2   Title  Patient will demonstrate kitchen activity in standing for up to 30 mins with less than 2 rest breaks.      Baseline  patient currently has to sit for kitchen task.     Time  12    Period  Weeks    Status  New    Target Date  07/04/18      OT LONG TERM GOAL #3   Title  Patient will complete tub transfer with supervision for safety and use of grab bars.     Baseline  unable at eval.     Time  12    Period  Weeks    Status  New    Target Date  07/04/18      OT LONG TERM GOAL #4   Title  Patient will increase left grip by 5# to assist with opening jars and containers.      Baseline  difficulty at eval and needs assist at times.     Time  8    Period  Weeks    Status  New    Target Date  06/06/18      OT LONG TERM GOAL #5   Title  Patient will demonstrate the ability to put away groceries in upper cabinets in standing with bilateral UE use and good balance.     Baseline  poor balance and decreased motion for reaching in higher cabiinets.     Time  12    Period  Weeks    Status  New    Target Date  07/04/18      Long Term Additional Goals   Additional Long Term Goals  Yes      OT LONG TERM GOAL #6   Title  Patient will complete laundry tasks with modified independence/modified techniques.     Baseline  difficulty with managing a basket, hanging clothes, reqiures rest breaks for tasks at eval    Time  8    Period  Weeks    Status  New    Target Date  06/06/18            Plan - 04/23/18 1100    Clinical Impression Statement Pt. reports having had a fall yesterday on a spill from her rooomate. Pt. reports that her right elbow is sore from the fall. Pt. continues to work on improving BUE strength, and [facility] skills in order  to be able to perform haircare tasks, and style hair, open jars, and containers, and perform home management kitchen, and laundry tasks.     Occupational Profile and client history currently impacting functional  performance  lives with room mates but needs to be independent with basic self care tasks, history of falls.      Occupational performance deficits (Please refer to evaluation for details):  ADL's;IADL's;Social Participation    Rehab Potential  Good    Current Impairments/barriers affecting progress:  decreased ROM/strength proximally UE, has some impaired sensation with burning in arms, foot drop    OT Frequency  2x / week    OT Duration  12 weeks    OT Treatment/Interventions  Self-care/ADL training;Cryotherapy;Therapeutic exercise;DME and/or AE instruction;Functional Mobility Training;Balance training;Neuromuscular education;Manual Therapy;Moist Heat;Energy conservation;Therapeutic activities;Patient/family education    Clinical Decision Making  Limited treatment options, no task modification necessary    Consulted and Agree with Plan of Care  Patient       Patient will benefit from skilled therapeutic intervention in order to improve the following deficits and impairments:  Abnormal gait, Decreased balance, Decreased endurance, Decreased mobility, Difficulty walking, Decreased range of motion, Pain, Decreased activity tolerance, Decreased coordination, Decreased strength, Impaired flexibility, Impaired UE functional use  Visit Diagnosis: Muscle weakness (generalized)  Other lack of coordination    Problem List Patient Active Problem List   Diagnosis Date Noted  . 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  . Anxiety and depression 10/01/2012  . Protein-calorie malnutrition, severe ([name]) 09/30/2012  . Alcohol abuse 09/26/2012  . Trichomonas infection 09/26/2012  . Dehydration 09/26/2012  . Hypokalemia 11/14/2011  . Thrombocytopenia ([name]) 11/14/2011  . Alcohol withdrawal ([name]) 11/13/2011  . Bipolar 2 disorder ([name]) 11/13/2011  . Tobacco abuse 11/13/2011  . Cocaine abuse ([name]) 11/13/2011  . Gout 11/13/2011    Olegario MessierElaine Natalynn Pedone, MS, OTR/L 04/23/2018, 11:13  AM  [place] [facility] [address] [place], [place], [address] Phone: [phone]   Fax:  [phone]  Name: Kellie RilingRyan N Boomer MRN: [id number] Date of Birth: [birth year]

## 2018-04-23 NOTE — Therapy (Signed)
[place] [facility] [address] [place], [place], [address] Phone: [phone]   Fax:  [phone]  Physical Therapy Treatment  Patient Details  Name: [name] MRN: [id number] Date of Birth: 1975-06-13 Referring Provider (PT): York Spanielcharles K willis   Encounter Date: 04/23/2018  PT End of Session - 04/23/18 1011    Visit Number  5    Number of Visits  16    Date for PT Re-Evaluation  06/06/18    Authorization Type  5/10 eval on 1/24    PT Start Time  0944    PT Stop Time  1015    PT Time Calculation (min)  31 min    Equipment Utilized During Treatment  Gait belt    Activity Tolerance  Patient tolerated treatment well    Behavior During Therapy  [name] for tasks assessed/performed       Past Medical History:  Diagnosis Date  . Alcohol abuse   . Anxiety   . Anxiety   . Anxiety and depression   . Asthma   . Bipolar disorder ([name])   . Borderline personality disorder ([name])   . Depression   . Diabetes ([name])    Type 2   . Dyslipidemia   . Esophageal varices ([name])   . Foot drop    right  . 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[name])   . GERD (gastroesophageal reflux disease)   . Gout   . History of drug abuse ([name])   . Insomnia   . Muscular dystrophies ([name])   . Neuropathy   . Pancreatitis   . Recovering alcoholic [facility]([name])     Past Surgical History:  Procedure Laterality Date  . HERNIA REPAIR      There were no vitals filed for this visit.  Subjective Assessment - 04/23/18 0948    Subjective  Patient slipped in roommates vomit yesterday and has a bruise on her R arm. No pain except site of bruise.     Pertinent History  [name] is a 43 year old right-handed white female with a history of obesity, diabetes, bipolar disorder, and a prior history of alcohol and cocaine abuse.  The patient has a history of FSH muscular dystrophy.  Patient has foot drop on R side for the past 7 years. Reports she has braces for  the ankles but not for the foot drop. The patient had initially noted some weakness in the lower extremities when she was in her late 420s.  The patient has progressed to have severe bilateral foot drops, she has AFO braces but does not wear them regularly.  The patient has developed some weakness of the arms, particularly with the deltoid muscles and has difficulty raising her arms up above her head.  The patient has fallen on occasion, she has fatigue of the legs when she has to walk longer distances such as in a store, she has to hold onto a cart in order to maintain the upright posture.  The patient does have significant low back pain when she is partially stoop with washing dishes.  Otherwise, she does not have back pain.  She has been trying to lose weight actively.  The patient does note some numbness in the toes, she may have some cramping in the feet at times.  She has some discomfort from the elbows to the shoulders on both arms.  The patient denies issues controlling the bowels or the bladder.Patient reports she loses her footing, does not depend on  time of the day or surfaces. Falls frequently, wants to learn how to get up from falling down.     Limitations  Lifting;Standing;Walking;House hold activities    How long can you sit comfortably?  n/a     How long can you stand comfortably?  1-2 minutes    How long can you walk comfortably?  15 minutes    Patient Stated Goals  walk more steady and for longer distances.     Currently in Pain?  No/denies                 -BTB df assist wrap on for all exercises. Wrapped bilateral ankles: (noted improvements of stability, upright posture, and ambulation)   Therapeutic Exercises    Transverse abdominis activation in seated, 10x3sec holds for core stability verbal/tactile cues for correct muscle activation, breathing    ambulate with df assist wrap:: improved stability noted. CGA 2x 86 ft, Increased clearance of bilateral feet, upright trunk  posture, decreased episodes of scissoring, increase step length bilaterally. Patient does not have to look at feet to ambulate when using df assist.    Ambulate in room scanning for cones with CGA and frequent stumbling with head turn. Increased trunk extension with head turn noted.     Seated:  Active DF 15x with active muscle contraction visible but didn't have functional movement. Ankle circles on dynadisc 10x clockwise, 10x counterclockwise, tactile/min A for mechanics with hands positioned on ankle. More challenging with LLE.     Neuromuscular re-edu: CGA due to limited ankle reaction response and instability   airex pad: eyes closed 30 seconds one finger support. Patient very fearful   airex pad: reaching and high fiving SPT with CGA inside and outside BOS 3 minutes    Balloon taps reaching inside and outside BOS inside // bars. CGA  No episodes of LOB     Side step with finger tip support and CGA into each square of speed ladder with cueing for sequencing and foot placement for improved coordination 4x length of // bars.             PT Education - 04/23/18 0951    Education Details  exercise technique,/form    Person(s) Educated  Patient    Methods  Explanation;Demonstration;Tactile cues;Verbal cues    Comprehension  Returned demonstration;Verbalized understanding;Verbal cues required;Tactile cues required;Need further instruction       PT Short Term Goals - 04/11/18 1200      PT SHORT TERM GOAL #1   Title  Patient will be independent in home exercise program to improve strength/mobility for better functional independence with ADLs.    Baseline  HEP given     Time  2    Period  Weeks    Status  New    Target Date  04/25/18      PT SHORT TERM GOAL #2   Title  Patient will deny any falls over past 2 weeks to demonstrate improved safety awareness at home and in her natural environment    Baseline  Larey Seat the other day     Time  2    Period  Weeks    Status  New     Target Date  04/25/18        PT Long Term Goals - 04/11/18 1200      PT LONG TERM GOAL #1   Title   Patient (< 41 years old) will complete five times sit to stand test in < 10  seconds without UE support indicating an increased LE strength and improved balance.    Baseline  1/24: 16 seconds with BUE support     Time  8    Period  Weeks    Status  New    Target Date  06/06/18      PT LONG TERM GOAL #2   Title  Patient will increase Berg Balance score by > 6 points (39/56) to demonstrate decreased fall risk during functional activities.    Baseline  1/24: 33/56    Time  8    Period  Weeks    Status  New    Target Date  06/06/18      PT LONG TERM GOAL #3   Title  Patient will increase six minute walk test distance to >1000 for progression to community ambulator and improve gait ability    Baseline  1/24: 390 ft     Time  8    Period  Weeks    Status  New    Target Date  06/06/18      PT LONG TERM GOAL #4   Title  Patient will increase ABC scale score >80% to demonstrate better functional mobility and better confidence with ADLs.     Baseline  1/24: 40.6%    Time  8    Period  Weeks    Status  New    Target Date  06/06/18            Plan - 04/23/18 1012    Clinical Impression Statement  Patient arrived late to session liming duration of interventions performed. Patient has contacted [facility] and is making an appointment for df assist.Ankle DF assist resulted in improved stability and mobility with decreased instability. Df assist script given to patient for patient to make appointment at orthotic clinic. Patient will benefit from skilled physical therapy to increase strength of LE's and core for postural control and mobility, increase righting reactions and stability, and decrease risk of falling for improved quality of life.     Rehab Potential  Fair    Clinical Impairments Affecting Rehab Potential  (+) age, motivation (-) FSHD, depression/anxiety/bipolar. Chronicity of  foot drop     PT Frequency  2x / week    PT Duration  8 weeks    PT Treatment/Interventions  ADLs/Self Care Home Management;Aquatic Therapy;Biofeedback;Cryotherapy;Electrical Stimulation;Iontophoresis 4mg /ml Dexamethasone;Moist Heat;Traction;Ultrasound;DME Instruction;Gait training;Therapeutic exercise;Therapeutic activities;Functional mobility training;Stair training;Balance training;Neuromuscular re-education;Patient/family education;Manual techniques;Orthotic Fit/Training;Compression bandaging;Passive range of motion;Vestibular;Canalith Repostioning;Taping;Splinting;Energy conservation;Dry needling    PT Next Visit Plan  review HEP, strengthen core and LE's, balance    PT Home Exercise Plan  see above    Consulted and Agree with Plan of Care  Patient       Patient will benefit from skilled therapeutic intervention in order to improve the following deficits and impairments:  Abnormal gait, Decreased activity tolerance, Decreased coordination, Decreased balance, Decreased endurance, Decreased knowledge of use of DME, Decreased safety awareness, Decreased range of motion, Decreased mobility, Decreased strength, Difficulty walking, Hypomobility, Increased fascial restricitons, Impaired flexibility, Impaired perceived functional ability, Impaired sensation, Postural dysfunction, Improper body mechanics, Pain, Obesity  Visit Diagnosis: Muscle weakness (generalized)  Other lack of coordination  Other abnormalities of gait and mobility  Unsteadiness on feet     Problem List Patient Active Problem List   Diagnosis Date Noted  . 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  . Anxiety and depression 10/01/2012  . Protein-calorie malnutrition, severe ([name]) 09/30/2012  . Alcohol abuse 09/26/2012  .  Trichomonas infection 09/26/2012  . Dehydration 09/26/2012  . Hypokalemia 11/14/2011  . Thrombocytopenia ([name]) 11/14/2011  . Alcohol withdrawal ([name]) 11/13/2011  . Bipolar 2  disorder ([name]) 11/13/2011  . Tobacco abuse 11/13/2011  . Cocaine abuse ([name]) 11/13/2011  . Gout 11/13/2011   Precious BardMarina Jasmynn Pfalzgraf, PT, DPT   04/23/2018, 10:19 AM  [place] [facility] [address] [place], [place], [address] Phone: [phone]   Fax:  [phone]  Name: [name] MRN: [id number] Date of Birth: 09/12/1975

## 2018-04-30 ENCOUNTER — Ambulatory Visit: Payer: Medicare Other

## 2018-04-30 ENCOUNTER — Ambulatory Visit: Payer: Medicare Other | Admitting: Occupational Therapy

## 2018-05-06 ENCOUNTER — Ambulatory Visit: Payer: Medicare Other

## 2018-05-08 ENCOUNTER — Encounter: Payer: Self-pay | Admitting: Occupational Therapy

## 2018-05-13 ENCOUNTER — Ambulatory Visit: Payer: Medicare Other | Admitting: Occupational Therapy

## 2018-05-13 ENCOUNTER — Encounter: Payer: Self-pay | Admitting: Occupational Therapy

## 2018-05-13 ENCOUNTER — Ambulatory Visit: Payer: Medicare Other

## 2018-05-13 DIAGNOSIS — M6281 Muscle weakness (generalized): Secondary | ICD-10-CM

## 2018-05-13 DIAGNOSIS — R278 Other lack of coordination: Secondary | ICD-10-CM

## 2018-05-13 DIAGNOSIS — R2681 Unsteadiness on feet: Secondary | ICD-10-CM

## 2018-05-13 DIAGNOSIS — R2689 Other abnormalities of gait and mobility: Secondary | ICD-10-CM

## 2018-05-13 NOTE — Therapy (Addendum)
[place] [facility] MAIN [facility] SERVICES [address] [place], [place], [address] Phone: [phone]   Fax:  [phone]  Physical Therapy Treatment  Patient Details  Name: [name] MRN: [id number] Date of Birth: Sep 19, 1975 Referring Provider (PT): York Spaniel   Encounter Date: 05/13/2018  PT End of Session - 05/13/18 1459    Visit Number  6    Number of Visits  16    Date for PT Re-Evaluation  06/06/18    Authorization Type  6/10 eval on 1/24    PT Start Time  1430    PT Stop Time  1450    PT Time Calculation (min)  20 min    Equipment Utilized During Treatment  Gait belt    Activity Tolerance  Patient tolerated treatment well    Behavior During Therapy  WFL for tasks assessed/performed       Past Medical History:  Diagnosis Date  . Alcohol abuse   . Anxiety   . Anxiety   . Anxiety and depression   . Asthma   . Bipolar disorder ([name])   . Borderline personality disorder ([name])   . Depression   . Diabetes ([name])    Type 2   . Dyslipidemia   . Esophageal varices ([name])   . Foot drop    right  . 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[name])   . GERD (gastroesophageal reflux disease)   . Gout   . History of drug abuse ([name])   . Insomnia   . Muscular dystrophies ([name])   . Neuropathy   . Pancreatitis   . Recovering alcoholic [facility])     Past Surgical History:  Procedure Laterality Date  . HERNIA REPAIR      There were no vitals filed for this visit.  Subjective Assessment - 05/13/18 1456    Subjective  Patient notes that she is doing well today. She states that she missed her last few PT appointments because she was not feeling the best. She reports that she has to leave today's session early because she has an appointment with [facility] for her dorsiflexion assist brace/device. Denies stumbles/falls since last visit.    Pertinent History  Ms. [name] is a 43 year old right-handed white female with a history of  obesity, diabetes, bipolar disorder, and a prior history of alcohol and cocaine abuse.  The patient has a history of FSH muscular dystrophy.  Patient has foot drop on R side for the past 7 years. Reports she has braces for the ankles but not for the foot drop. The patient had initially noted some weakness in the lower extremities when she was in her late 40s.  The patient has progressed to have severe bilateral foot drops, she has AFO braces but does not wear them regularly.  The patient has developed some weakness of the arms, particularly with the deltoid muscles and has difficulty raising her arms up above her head.  The patient has fallen on occasion, she has fatigue of the legs when she has to walk longer distances such as in a store, she has to hold onto a cart in order to maintain the upright posture.  The patient does have significant low back pain when she is partially stoop with washing dishes.  Otherwise, she does not have back pain.  She has been trying to lose weight actively.  The patient does note some numbness in the toes, she may have some cramping in the feet at times.  She  has some discomfort from the elbows to the shoulders on both arms.  The patient denies issues controlling the bowels or the bladder.Patient reports she loses her footing, does not depend on time of the day or surfaces. Falls frequently, wants to learn how to get up from falling down.     Limitations  Lifting;Standing;Walking;House hold activities    How long can you sit comfortably?  n/a     How long can you stand comfortably?  1-2 minutes    How long can you walk comfortably?  15 minutes    Patient Stated Goals  walk more steady and for longer distances.     Currently in Pain?  Yes    Pain Score  5     Pain Location  Arm    Pain Orientation  Right    Pain Descriptors / Indicators  Aching    Pain Type  Chronic pain    Pain Onset  More than a month ago        The following performed in parallel bars with CGA due  to limited ankle reaction response and instability:  -Airex pad: Horizontal head turns x10 to each side while holding rainbow ball  -Airex pad: Vertical head turns x10 up/down while holding rainbow ball  -Airex pad: Normal stance eyes closed 2x30 seconds without UE support; patient only experiences one minor LOB posteriorly requiring minimal assistance from SPT to correct   -Lateral stepping over orange hurdle with BUE support for balance x10 each direction; patient benefits from verbal and tactile cues for upright posture and to keep toes pointed forward  -Ankle circles on dynadisc 10x clockwise, 10x counterclockwise, tactile/min A for mechanics with hands positioned on ankle                      PT Education - 05/13/18 1458    Education Details  exercise technique/form, stability    Person(s) Educated  Patient    Methods  Explanation;Tactile cues;Verbal cues;Demonstration    Comprehension  Verbalized understanding;Returned demonstration;Verbal cues required;Tactile cues required;Need further instruction       PT Short Term Goals - 04/11/18 1200      PT SHORT TERM GOAL #1   Title  Patient will be independent in home exercise program to improve strength/mobility for better functional independence with ADLs.    Baseline  HEP given     Time  2    Period  Weeks    Status  New    Target Date  04/25/18      PT SHORT TERM GOAL #2   Title  Patient will deny any falls over past 2 weeks to demonstrate improved safety awareness at home and in her natural environment    Baseline  Larey SeatFell the other day     Time  2    Period  Weeks    Status  New    Target Date  04/25/18        PT Long Term Goals - 04/11/18 1200      PT LONG TERM GOAL #1   Title   Patient (< 42 years old) will complete five times sit to stand test in < 10 seconds without UE support indicating an increased LE strength and improved balance.    Baseline  1/24: 16 seconds with BUE support     Time  8     Period  Weeks    Status  New    Target Date  06/06/18  PT LONG TERM GOAL #2   Title  Patient will increase Berg Balance score by > 6 points (39/56) to demonstrate decreased fall risk during functional activities.    Baseline  1/24: 33/56    Time  8    Period  Weeks    Status  New    Target Date  06/06/18      PT LONG TERM GOAL #3   Title  Patient will increase six minute walk test distance to >1000 for progression to community ambulator and improve gait ability    Baseline  1/24: 390 ft     Time  8    Period  Weeks    Status  New    Target Date  06/06/18      PT LONG TERM GOAL #4   Title  Patient will increase ABC scale score >80% to demonstrate better functional mobility and better confidence with ADLs.     Baseline  1/24: 40.6%    Time  8    Period  Weeks    Status  New    Target Date  06/06/18            Plan - 05/13/18 1704    Clinical Impression Statement  Today's session was limited by patient having to leave early to head to another appointment with [facility] for dorsiflexion assist brace/device. However, the patient demonstrated improved balance on the Airex pad with eyes closed, in addition to slightly improved ankle control during performance of ankle circles on dynadisc today as compared to previous sessions. Patient will continue to benefit from skilled PT in order to increase strength of BLE's and core for postural control and mobility, to increase righting reactions and stability, and to decrease risk of falls for improved quality of life.      Rehab Potential  Fair    Clinical Impairments Affecting Rehab Potential  (+) age, motivation (-) FSHD, depression/anxiety/bipolar. Chronicity of foot drop     PT Frequency  2x / week    PT Duration  8 weeks    PT Treatment/Interventions  ADLs/Self Care Home Management;Aquatic Therapy;Biofeedback;Cryotherapy;Electrical Stimulation;Iontophoresis 4mg /ml Dexamethasone;Moist Heat;Traction;Ultrasound;DME Instruction;Gait  training;Therapeutic exercise;Therapeutic activities;Functional mobility training;Stair training;Balance training;Neuromuscular re-education;Patient/family education;Manual techniques;Orthotic Fit/Training;Compression bandaging;Passive range of motion;Vestibular;Canalith Repostioning;Taping;Splinting;Energy conservation;Dry needling    PT Next Visit Plan  review HEP, strengthen core and LE's, progress balance    PT Home Exercise Plan  see above    Consulted and Agree with Plan of Care  Patient       Patient will benefit from skilled therapeutic intervention in order to improve the following deficits and impairments:  Abnormal gait, Decreased activity tolerance, Decreased coordination, Decreased balance, Decreased endurance, Decreased knowledge of use of DME, Decreased safety awareness, Decreased range of motion, Decreased mobility, Decreased strength, Difficulty walking, Hypomobility, Increased fascial restricitons, Impaired flexibility, Impaired perceived functional ability, Impaired sensation, Postural dysfunction, Improper body mechanics, Pain, Obesity  Visit Diagnosis: Muscle weakness (generalized)  Other lack of coordination  Other abnormalities of gait and mobility  Unsteadiness on feet     Problem List Patient Active Problem List   Diagnosis Date Noted  . 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  . Anxiety and depression 10/01/2012  . Protein-calorie malnutrition, severe ([name]) 09/30/2012  . Alcohol abuse 09/26/2012  . Trichomonas infection 09/26/2012  . Dehydration 09/26/2012  . Hypokalemia 11/14/2011  . Thrombocytopenia ([name]) 11/14/2011  . Alcohol withdrawal ([name]) 11/13/2011  . Bipolar 2 disorder ([name]) 11/13/2011  . Tobacco abuse 11/13/2011  . Cocaine  abuse ([name]) 11/13/2011  . Gout 11/13/2011   Sandra Cockayne, SPT  This entire session was performed under direct supervision and direction of a licensed therapist/therapist assistant . I have  personally read, edited and approve of the note as written.  Precious Bard, PT, DPT   05/14/2018, 8:41 AM  [facility] MAIN [facility] SERVICES [address] [place], [place], [address] Phone: [phone]   Fax:  [phone]  Name: LUVADA SALAMONE MRN: [id number] Date of Birth: 07-20-1975

## 2018-05-13 NOTE — Therapy (Signed)
[facility] MAIN [facility] SERVICES [address] [place], [place], [address] Phone: [phone]   Fax:  [phone]  Occupational Therapy Treatment  Patient Details  Name: [name] MRN: [id number] Date of Birth: 07-06-1975 No data recorded  Encounter Date: 05/13/2018  OT End of Session - 05/13/18 1404    Visit Number  5    Number of Visits  12    Date for OT Re-Evaluation  06/06/18    Authorization Type  [employer]     OT Start Time  1345    OT Stop Time  1430    OT Time Calculation (min)  45 min    Activity Tolerance  Patient tolerated treatment well    Behavior During Therapy  [facility] for tasks assessed/performed       Past Medical History:  Diagnosis Date  . Alcohol abuse   . Anxiety   . Anxiety   . Anxiety and depression   . Asthma   . Bipolar disorder ([name])   . Borderline personality disorder ([name])   . Depression   . Diabetes ([name])    Type 2   . Dyslipidemia   . Esophageal varices ([name])   . Foot drop    right  . 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[name])   . GERD (gastroesophageal reflux disease)   . Gout   . History of drug abuse ([name])   . Insomnia   . Muscular dystrophies ([name])   . Neuropathy   . Pancreatitis   . Recovering alcoholic [facility])     Past Surgical History:  Procedure Laterality Date  . HERNIA REPAIR      There were no vitals filed for this visit.  Subjective Assessment - 05/13/18 1357    Subjective   Pt. Reports being sick for the past few weeks.   Pertinent History  Ms. Scarberry is a 43 year old right-handed white female with a history of obesity, diabetes, bipolar disorder, and a prior history of alcohol and cocaine abuse.  The patient has a history of FSH muscular dystrophy.  Patient has foot drop on R side for the past 7 years.     Patient Stated Goals  Patient reports she would like to get stronger and be as independent as possible.     Currently in Pain?  No/denies    Pain Score  5     Pain  Orientation  Right    Pain Descriptors / Indicators  Aching    Pain Type  Chronic pain    Pain Onset  More than a month ago      OT TREATMENT    Therapeutic Ex.: Pt. performed 2# dowel ex. For UE strengthening secondary to weakness. Bilateral shoulder flexion, chest press, circular patterns, and elbow flexion/extension were performed. Pt. Worked on 2# dumbbell ex. for elbow flexion and extension, forearm supination/pronation, wrist flexion/extension, and radial deviation. Pt. requires rest breaks and verbal cues for proper technique.  Neuro muscular re-education:  Pt. performed right and left [facility] skills training to improve speed and dexterity needed for ADL tasks and writing. Pt. demonstrated grasping 1 inch sticks,  inch cylindrical collars, and  inch flat washers on the Purdue Pegboard. Pt. performed grasping each item with her 2nd digit and thumb. Pt. worked on bilateral alternating hand movements placing the pegs back in the dishes on the right, and left.  OT Long Term Goals - 04/16/18 8372      OT LONG TERM GOAL #1   Title  Patient will increase shoulder flexion bilaterally by 20 degrees of motion to assist with hair care and styling.      Baseline  limited strength and motion and requires assist for hair care at eval    Time  12    Period  Weeks    Status  New    Target Date  07/04/18      OT LONG TERM GOAL #2   Title  Patient will demonstrate kitchen activity in standing for up to 30 mins with less than 2 rest breaks.      Baseline  patient currently has to sit for kitchen task.     Time  12    Period  Weeks    Status  New    Target Date  07/04/18      OT LONG TERM GOAL #3   Title  Patient will complete tub transfer with supervision for safety and use of grab bars.     Baseline  unable at eval.     Time  12    Period  Weeks    Status  New    Target Date  07/04/18      OT LONG TERM GOAL #4   Title  Patient  will increase left grip by 5# to assist with opening jars and containers.      Baseline  difficulty at eval and needs assist at times.     Time  8    Period  Weeks    Status  New    Target Date  06/06/18      OT LONG TERM GOAL #5   Title  Patient will demonstrate the ability to put away groceries in upper cabinets in standing with bilateral UE use and good balance.     Baseline  poor balance and decreased motion for reaching in higher cabiinets.     Time  12    Period  Weeks    Status  New    Target Date  07/04/18      Long Term Additional Goals   Additional Long Term Goals  Yes      OT LONG TERM GOAL #6   Title  Patient will complete laundry tasks with modified independence/modified techniques.     Baseline  difficulty with managing a basket, hanging clothes, reqiures rest breaks for tasks at eval    Time  8    Period  Weeks    Status  New    Target Date  06/06/18            Plan - 05/13/18 1405    Clinical Impression Statement Pt. reports that she has been sick since her birthday, and has been unable to come to therapy. Pt. has an appointment today at [facility] to get her LE braces.  Pt. continues to work on improving RUE strength, and [facility] skills. in order to improve, and promote indepndence with ADLs, and IADL tasks..    Occupational Profile and client history currently impacting functional performance  lives with room mates but needs to be independent with basic self care tasks, history of falls.      Occupational performance deficits (Please refer to evaluation for details):  ADL's;IADL's;Social Participation    Rehab Potential  Good    Current Impairments/barriers affecting progress:  decreased ROM/strength proximally UE, has some impaired sensation with burning in arms,  foot drop    OT Frequency  2x / week    OT Duration  12 weeks    OT Treatment/Interventions  Self-care/ADL training;Cryotherapy;Therapeutic exercise;DME and/or AE instruction;Functional Mobility  Training;Balance training;Neuromuscular education;Manual Therapy;Moist Heat;Energy conservation;Therapeutic activities;Patient/family education    Clinical Decision Making  Limited treatment options, no task modification necessary    Consulted and Agree with Plan of Care  Patient       Patient will benefit from skilled therapeutic intervention in order to improve the following deficits and impairments:  Abnormal gait, Decreased balance, Decreased endurance, Decreased mobility, Difficulty walking, Decreased range of motion, Pain, Decreased activity tolerance, Decreased coordination, Decreased strength, Impaired flexibility, Impaired UE functional use  Visit Diagnosis: Muscle weakness (generalized)  Other lack of coordination    Problem List Patient Active Problem List   Diagnosis Date Noted  . 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  . Anxiety and depression 10/01/2012  . Protein-calorie malnutrition, severe ([name]) 09/30/2012  . Alcohol abuse 09/26/2012  . Trichomonas infection 09/26/2012  . Dehydration 09/26/2012  . Hypokalemia 11/14/2011  . Thrombocytopenia ([name]) 11/14/2011  . Alcohol withdrawal ([name]) 11/13/2011  . Bipolar 2 disorder ([name]) 11/13/2011  . Tobacco abuse 11/13/2011  . Cocaine abuse ([name]) 11/13/2011  . Gout 11/13/2011    Olegario Messier, MS, OTR/L 05/13/2018, 4:04 PM  [facility] MAIN [facility] SERVICES [address] [place], [place], [address] Phone: [phone]   Fax:  [phone]  Name: [name] MRN: [id number] Date of Birth: December 28, 1975

## 2018-05-15 ENCOUNTER — Ambulatory Visit: Payer: Medicare Other | Admitting: Occupational Therapy

## 2018-05-15 ENCOUNTER — Ambulatory Visit: Payer: Self-pay

## 2018-05-20 ENCOUNTER — Ambulatory Visit: Payer: Medicare Other | Attending: Neurology | Admitting: Occupational Therapy

## 2018-05-20 ENCOUNTER — Ambulatory Visit: Payer: Medicare Other

## 2018-05-22 ENCOUNTER — Ambulatory Visit: Payer: Medicare Other | Admitting: Occupational Therapy

## 2018-05-22 ENCOUNTER — Ambulatory Visit: Payer: Medicare Other | Admitting: Physical Therapy

## 2018-05-27 ENCOUNTER — Ambulatory Visit: Payer: Medicare Other | Admitting: Occupational Therapy

## 2018-05-27 ENCOUNTER — Ambulatory Visit: Payer: Medicare Other

## 2018-05-30 ENCOUNTER — Ambulatory Visit: Payer: Self-pay

## 2018-05-30 ENCOUNTER — Encounter: Payer: Self-pay | Admitting: Occupational Therapy

## 2018-06-02 ENCOUNTER — Ambulatory Visit: Payer: Self-pay

## 2018-06-02 ENCOUNTER — Encounter: Payer: Self-pay | Admitting: Occupational Therapy

## 2018-06-04 ENCOUNTER — Ambulatory Visit: Payer: Self-pay

## 2018-06-04 ENCOUNTER — Encounter: Payer: Self-pay | Admitting: Occupational Therapy

## 2018-06-09 ENCOUNTER — Ambulatory Visit: Payer: Self-pay

## 2018-06-09 ENCOUNTER — Encounter: Payer: Self-pay | Admitting: Occupational Therapy

## 2018-06-10 ENCOUNTER — Encounter: Payer: Self-pay | Admitting: Occupational Therapy

## 2018-06-10 NOTE — Therapy (Signed)
[place] MAIN [facility] SERVICES [address] [place], [place], [address] Phone: [phone]   Fax:  [phone]  June 10, 2018    '@CCLISTADDRESS'$ @  Occupational Therapy Discharge Summary   Patient: [name] MRN: [id number] Date of Birth: Nov 09, 1975  Diagnosis: No diagnosis found.  No data recorded  The above patient had been seen in Occupational Therapy, however has not returned to the clinic following a vacation to [facility].  The treatment consisted of ADL, and IADL training,UE there. Ex, neuromuscular re-education. , and pt. Education about energy conservation/work simplification The patient is: unchanged:[id number]}  Subjective:  The pt.s last scheduled visit was 05/13/2018.   Discharge Findings: Pt. Has been discharged after multiple consecutivel no shows, with unsuccessful attempts to reschedule.  Functional Status at Discharge: OT Long Term Goals - 04/16/18 7262      OT LONG TERM GOAL #1   Title  Patient will increase shoulder flexion bilaterally by 20 degrees of motion to assist with hair care and styling.      Baseline  limited strength and motion and requires assist for hair care at eval    Time  12    Period  Weeks    Status  Not met   Target Date  07/04/18      OT LONG TERM GOAL #2   Title  Patient will demonstrate kitchen activity in standing for up to 30 mins with less than 2 rest breaks.      Baseline  patient currently has to sit for kitchen task.     Time  12    Period  Weeks    Status  Not met    Target Date  07/04/18      OT LONG TERM GOAL #3   Title  Patient will complete tub transfer with supervision for safety and use of grab bars.     Baseline  unable at eval.     Time  12    Period  Weeks    Status  Not met   Target Date  07/04/18      OT LONG TERM GOAL #4   Title  Patient will increase left grip by 5# to assist with opening jars and containers.      Baseline  difficulty at eval and needs  assist at times.     Time  8    Period  Weeks   Status  Not met   Target Date  06/06/18      OT LONG TERM GOAL #5   Title  Patient will demonstrate the ability to put away groceries in upper cabinets in standing with bilateral UE use and good balance.     Baseline  poor balance and decreased motion for reaching in higher cabiinets.     Time  12    Period  Weeks    Status  Not met   Target Date  07/04/18      Long Term Additional Goals   Additional Long Term Goals  Yes      OT LONG TERM GOAL #6   Title  Patient will complete laundry tasks with modified independence/modified techniques.     Baseline  difficulty with managing a basket, hanging clothes, reqiures rest breaks for tasks at eval    Time  8    Period  Weeks    Status  New    Target Date  06/06/18         Sincerely,  Margaretha Sheffield  Larose Hires, MS, OTR/L  CC '@[email or web address]'$ @  [place] MAIN [facility] SERVICES [address] [place], [place], [address] Phone: [phone]   Fax:  [phone]  Patient: [name] MRN: [id number] Date of Birth: Feb 17, 1976

## 2018-06-11 ENCOUNTER — Encounter: Payer: Self-pay | Admitting: Occupational Therapy

## 2018-06-11 ENCOUNTER — Ambulatory Visit: Payer: Self-pay

## 2018-06-16 ENCOUNTER — Ambulatory Visit: Payer: Self-pay

## 2018-06-18 ENCOUNTER — Ambulatory Visit: Payer: Self-pay

## 2018-07-18 DIAGNOSIS — J45909 Unspecified asthma, uncomplicated: Secondary | ICD-10-CM | POA: Diagnosis not present

## 2018-07-18 DIAGNOSIS — J019 Acute sinusitis, unspecified: Secondary | ICD-10-CM | POA: Diagnosis not present

## 2018-07-24 DIAGNOSIS — M21372 Foot drop, left foot: Secondary | ICD-10-CM | POA: Diagnosis not present

## 2018-07-24 DIAGNOSIS — M21371 Foot drop, right foot: Secondary | ICD-10-CM | POA: Diagnosis not present

## 2018-09-09 ENCOUNTER — Other Ambulatory Visit: Payer: Self-pay

## 2018-09-09 ENCOUNTER — Encounter: Payer: Self-pay | Admitting: Emergency Medicine

## 2018-09-09 ENCOUNTER — Emergency Department
Admission: EM | Admit: 2018-09-09 | Discharge: 2018-09-10 | Disposition: A | Discharge status: Other Acute Inpatient Hospital | Payer: Medicare Other | Source: Home / Self Care | Attending: Emergency Medicine | Admitting: Emergency Medicine

## 2018-09-09 DIAGNOSIS — E119 Type 2 diabetes mellitus without complications: Secondary | ICD-10-CM | POA: Insufficient documentation

## 2018-09-09 DIAGNOSIS — F3181 Bipolar II disorder: Secondary | ICD-10-CM | POA: Diagnosis present

## 2018-09-09 DIAGNOSIS — Z03818 Encounter for observation for suspected exposure to other biological agents ruled out: Secondary | ICD-10-CM | POA: Diagnosis not present

## 2018-09-09 DIAGNOSIS — Z20828 Contact with and (suspected) exposure to other viral communicable diseases: Secondary | ICD-10-CM | POA: Insufficient documentation

## 2018-09-09 DIAGNOSIS — F1910 Other psychoactive substance abuse, uncomplicated: Secondary | ICD-10-CM

## 2018-09-09 DIAGNOSIS — F17200 Nicotine dependence, unspecified, uncomplicated: Secondary | ICD-10-CM | POA: Insufficient documentation

## 2018-09-09 DIAGNOSIS — J45909 Unspecified asthma, uncomplicated: Secondary | ICD-10-CM | POA: Insufficient documentation

## 2018-09-09 DIAGNOSIS — Z046 Encounter for general psychiatric examination, requested by authority: Secondary | ICD-10-CM | POA: Insufficient documentation

## 2018-09-09 DIAGNOSIS — F3112 Bipolar disorder, current episode manic without psychotic features, moderate: Secondary | ICD-10-CM | POA: Diagnosis not present

## 2018-09-09 LAB — COMPREHENSIVE METABOLIC PANEL
ALT: 19 U/L (ref 0–44)
AST: 69 U/L — ABNORMAL HIGH (ref 15–41)
Albumin: 3.9 g/dL (ref 3.5–5.0)
Alkaline Phosphatase: 69 U/L (ref 38–126)
Anion gap: 13 (ref 5–15)
BUN: 10 mg/dL (ref 6–20)
CO2: 16 mmol/L — ABNORMAL LOW (ref 22–32)
Calcium: 8.8 mg/dL — ABNORMAL LOW (ref 8.9–10.3)
Chloride: 104 mmol/L (ref 98–111)
Creatinine, Ser: 0.55 mg/dL (ref 0.44–1.00)
GFR calc Af Amer: >60 mL/min (ref >60)
GFR calc non Af Amer: >60 mL/min (ref >60)
Glucose, Bld: 221 mg/dL — ABNORMAL HIGH (ref 70–99)
Potassium: 3.9 mmol/L (ref 3.5–5.1)
Sodium: 133 mmol/L — ABNORMAL LOW (ref 135–145)
Total Bilirubin: 0.4 mg/dL (ref 0.3–1.2)
Total Protein: 7.1 g/dL (ref 6.5–8.1)

## 2018-09-09 LAB — CBC
HCT: 40.4 % (ref 36.0–46.0)
Hemoglobin: 13.3 g/dL (ref 12.0–15.0)
MCH: 30.8 pg (ref 26.0–34.0)
MCHC: 32.9 g/dL (ref 30.0–36.0)
MCV: 93.5 fL (ref 80.0–100.0)
Platelets: 254 10*3/uL (ref 150–400)
RBC: 4.32 MIL/uL (ref 3.87–5.11)
RDW: 12.9 % (ref 11.5–15.5)
WBC: 12 10*3/uL — ABNORMAL HIGH (ref 4.0–10.5)
nRBC: 0 % (ref 0.0–0.2)

## 2018-09-09 LAB — SALICYLATE LEVEL: Salicylate Lvl: <7 mg/dL (ref 2.8–30.0)

## 2018-09-09 LAB — ETHANOL: Alcohol, Ethyl (B): <10 mg/dL (ref <10)

## 2018-09-09 LAB — POCT PREGNANCY, URINE: Preg Test, Ur: NEGATIVE

## 2018-09-09 LAB — URINE DRUG SCREEN, QUALITATIVE (ARMC ONLY)
Amphetamines, Ur Screen: POSITIVE — AB
Barbiturates, Ur Screen: NOT DETECTED
Benzodiazepine, Ur Scrn: POSITIVE — AB
Cannabinoid 50 Ng, Ur ~~LOC~~: POSITIVE — AB
Cocaine Metabolite,Ur ~~LOC~~: POSITIVE — AB
MDMA (Ecstasy)Ur Screen: NOT DETECTED
Methadone Scn, Ur: NOT DETECTED
Opiate, Ur Screen: NOT DETECTED
Phencyclidine (PCP) Ur S: NOT DETECTED
Tricyclic, Ur Screen: NOT DETECTED

## 2018-09-09 LAB — ACETAMINOPHEN LEVEL: Acetaminophen (Tylenol), Serum: <10 ug/mL — ABNORMAL LOW (ref 10–30)

## 2018-09-09 NOTE — ED Notes (Signed)
Hourly rounding reveals patient sleeping in room. No complaints, stable, in no acute distress. Q15 minute rounds and monitoring via Security Cameras to continue. 

## 2018-09-09 NOTE — ED Provider Notes (Signed)
[facility] Emergency Department Provider Note       Time seen: ----------------------------------------- 6:10 PM on 09/09/2018 -----------------------------------------   I have reviewed the triage vital signs and the nursing notes.  HISTORY   Chief Complaint Psychiatric Evaluation   HPI [name] is a 43 y.o. female with a history of alcohol abuse, anxiety, bipolar disorder, borderline personality, GERD, insomnia, drug abuse who presents to the ED for a psychiatric evaluation.  Patient denies any specific suicidal or homicidal ideation but states she feels unsafe to be alone because she is making bad choices using alcohol, methamphetamines and LSD.  She denies any recent illness.  Past Medical History:  Diagnosis Date  . Alcohol abuse   . Anxiety   . Anxiety   . Anxiety and depression   . Asthma   . Bipolar disorder ([place])   . Borderline personality disorder ([place])   . Depression   . Diabetes ([place])    Type 2   . Dyslipidemia   . Esophageal varices ([place])   . Foot drop    right  . 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)   . GERD (gastroesophageal reflux disease)   . Gout   . History of drug abuse ([place])   . Insomnia   . Muscular dystrophies ([place])   . Neuropathy   . Pancreatitis   . 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 Anxiety and depression 10/01/2012  . Protein-calorie malnutrition, severe ([place]) 09/30/2012  . Alcohol abuse 09/26/2012  . Trichomonas infection 09/26/2012  . Dehydration 09/26/2012  . Hypokalemia 11/14/2011  . Thrombocytopenia ([place]) 11/14/2011  . Alcohol withdrawal ([place]) 11/13/2011  . Bipolar 2 disorder ([place]) 11/13/2011  . Tobacco abuse 11/13/2011  . Cocaine abuse ([place]) 11/13/2011  . Gout 11/13/2011    Past Surgical History:  Procedure Laterality Date  . HERNIA REPAIR       Allergies Erythromycin, Sulfa antibiotics, Tylenol [acetaminophen], Nsaids, and Gabapentin  Social History Social History   Tobacco Use  . Smoking status: Current Every Day Smoker    Packs/day: 0.50  . Smokeless tobacco: Never Used  Substance Use Topics  . Alcohol use: Not Currently    Comment: in rehab  . Drug use: No    Comment: in rehab   Review of Systems Constitutional: Negative for fever. Cardiovascular: Negative for chest pain. Respiratory: Negative for shortness of breath. Gastrointestinal: Negative for abdominal pain, vomiting and diarrhea. Musculoskeletal: Negative for back pain. Skin: Negative for rash. Neurological: Negative for headaches, focal weakness or numbness. Psychiatric: Positive for drug and alcohol abuse  All systems negative/normal/unremarkable except as stated in the HPI  ____________________________________________   PHYSICAL EXAM:  VITAL SIGNS: ED Triage Vitals  Enc Vitals Group     BP 09/09/18 1752 140/85     Pulse Rate 09/09/18 1752 (!) 117     Resp 09/09/18 1752 18     Temp 09/09/18 1752 98.1 F (36.7 C)     Temp Source 09/09/18 1752 Oral     SpO2 09/09/18 1752 95 %     Weight 09/09/18 1753 211 lb (95.7 kg)     Height 09/09/18 1753 5\' 7"  (1.702 m)     Head Circumference --      Peak Flow --      Pain Score 09/09/18 1753 0     Pain Loc --      Pain Edu? --  Excl. in GC? --    Constitutional: Alert and oriented. Well appearing and in no distress. Eyes: Conjunctivae are normal. Normal extraocular movements. Cardiovascular: Normal rate, regular rhythm. No murmurs, rubs, or gallops. Respiratory: Normal respiratory effort without tachypnea nor retractions. Breath sounds are clear and equal bilaterally. No wheezes/rales/rhonchi. Gastrointestinal: Soft and nontender. Normal bowel sounds Musculoskeletal: Nontender with normal range of motion in extremities. No lower extremity tenderness nor edema. Neurologic:  Normal speech and  language. No gross focal neurologic deficits are appreciated.  Skin:  Skin is warm, dry and intact. No rash noted. Psychiatric: Mood and affect are normal. Speech and behavior are normal.   ____________________________________________  ED COURSE:  As part of my medical decision making, I reviewed the following data within the electronic MEDICAL RECORD NUMBER History obtained from family if available, nursing notes, old chart and ekg, as well as notes from prior ED visits. Patient presented for polysubstance abuse, we will assess with labs as indicated at this time.   Procedures  Jasper RilingRyan N Dixon was evaluated in Emergency Department on 09/09/2018 for the symptoms described in the history of present illness. She was evaluated in the context of the global COVID-19 pandemic, which necessitated consideration that the patient might be at risk for infection with the SARS-CoV-2 virus that causes COVID-19. Institutional protocols and algorithms that pertain to the evaluation of patients at risk for COVID-19 are in a state of rapid change based on information released by regulatory bodies including the CDC and federal and state organizations. These policies and algorithms were followed during the patient's care in the ED.  ____________________________________________   LABS (pertinent positives/negatives)  Labs Reviewed  COMPREHENSIVE METABOLIC PANEL - Abnormal; Notable for the following components:      Result Value   Sodium 133 (*)    CO2 16 (*)    Glucose, Bld 221 (*)    Calcium 8.8 (*)    AST 69 (*)    All other components within normal limits  ACETAMINOPHEN LEVEL - Abnormal; Notable for the following components:   Acetaminophen (Tylenol), Serum <10 (*)    All other components within normal limits  CBC - Abnormal; Notable for the following components:   WBC 12.0 (*)    All other components within normal limits  URINE DRUG SCREEN, QUALITATIVE ([facility] ONLY) - Abnormal; Notable for the following  components:   Amphetamines, Ur Screen POSITIVE (*)    Cocaine Metabolite,Ur [place] POSITIVE (*)    Cannabinoid 50 Ng, Ur [place] POSITIVE (*)    Benzodiazepine, Ur Scrn POSITIVE (*)    All other components within normal limits  ETHANOL  SALICYLATE LEVEL  POC URINE PREG, ED  POCT PREGNANCY, URINE  ____________________________________________   DIFFERENTIAL DIAGNOSIS   Polysubstance abuse, depression, anxiety, bipolar disorder  FINAL ASSESSMENT AND PLAN  Polysubstance abuse   Plan: The patient had presented for a psychiatric evaluation. Patient's labs reveal polysubstance abuse as expected without other acute process.  She appears medically clear for psychiatric evaluation and disposition.   Ulice DashJohnathan E Lodie Waheed, MD    Note: This note was generated in part or whole with voice recognition software. Voice recognition is usually quite accurate but there are transcription errors that can and very often do occur. I apologize for any typographical errors that were not detected and corrected.     Emily FilbertWilliams, Azyah Flett E, MD 09/09/18 [phone]

## 2018-09-09 NOTE — ED Notes (Signed)
Pt. Transferred to [place] from ED to room 1 after screening for contraband. Report to include Situation, Background, Assessment and Recommendations from Amy B. RN. Pt. Oriented to unit including Q15 minute rounds as well as the security cameras for their protection. Patient is alert and oriented, warm and dry in no acute distress. Patient denies SI, HI, and AVH. Pt. Encouraged to let me know if needs arise.

## 2018-09-09 NOTE — ED Notes (Signed)
Pt dressed out with 2 staff members present. Belongings include: black suitcase, blue/tan flip flops, white shirt, tan bra, green pants, and green and white striped bag with wallet inside. Pt states she left her phone at home.

## 2018-09-09 NOTE — Consult Note (Signed)
[facility] Face-to[facility] Consult   Reason for Consult: Manic behaviors Referring Physician: Dr. Mayford KnifeWilliams Patient Identification: [name] MRN:  [id number] Principal Diagnosis: Bipolar 2 disorder [facility]([name]) Diagnosis:  Principal Problem:   Bipolar 2 disorder ([name])   Total Time spent with patient: 1 hour  Subjective: "I knew.  I had to come in to save myself for me. [name] is a 43 y.o. female patient presented [facility] via POV voluntarily.  The patient stated that she felt she needed to seek psychiatric care due to her manic behaviors.  The patient admits to heavy polysubstance use yesterday along with drinking "a lot" of alcohol while driving.  The patient states, "I am not suicidal but I know if I did not come in and get help I might do something bad to myself."  The patient states that it has been a long time since she has had a psychiatric in patient hospitalization.  She states that she is currently a patient of Dr. Betti Cruzeddy at [facility] for her mental illness.  She also disclosed that she receives peer support services through [facility] in [place].  The patient admits to being compliant with her prescribed medications.  The patient admits to being stable for many years.  She discussed her last hospitalization was over 10 years ago.  The patient did disclose that she has an upcoming psychiatric appointment with Dr. Betti Cruzeddy on 09/10/2018 at 12 noon via telephone.  The patient admits to being a convicted felon due to her being arrested for drug charges.  She currently denies any legal issues or court dates pending.  The patient was seen face-to-face by this provider; chart reviewed and consulted with Dr.Williams on 09/09/2018 due to the care of the patient. It was discussed with the provider that the patient does meet criteria to be admitted to the inpatient unit.  On evaluation the patient is alert and oriented x4, manic but cooperative, and mood-congruent with  affect.  The patient does not appear to be responding to internal or external stimuli. Neither is the patient presenting with any delusional thinking.  The patient is presenting with increased mania which leaves her unsafe to be discharged from the ED.  The patient denies auditory or visual hallucinations. The patient denies any suicidal, homicidal, but believes she could self-harm unintentionally.  The patient is not presenting with any psychotic or paranoid behaviors. During an encounter with the patient, she was able to answer questions appropriately. Collateral was obtained due to the patient refusing for this provider to make contact with her mother.  Plan: The patient is a safety risk to self and others due to the risky behaviors she is participating in while out in the community. She does require psychiatric inpatient admission for stabilization and treatment.   HPI: Per Dr. Mayford KnifeWilliams; [name] is a 43 y.o. female with a history of alcohol abuse, anxiety, bipolar disorder, borderline personality, GERD, insomnia, drug abuse who presents to the ED for a psychiatric evaluation.  Patient denies any specific suicidal or homicidal ideation but states she feels unsafe to be alone because she is making bad choices using alcohol, methamphetamines and LSD.  She denies any recent illness.  Past Psychiatric History:  Alcohol abuse Anxiety Anxiety and depression Bipolar disorder ([name]) Borderline personality disorder ([name]) Polysubstance abuse  Risk to Self: Suicidal Ideation: No Suicidal Intent: No Is patient at risk for suicide?: No Suicidal Plan?: No Access to Means: No What has been your use  of drugs/alcohol within the last 12 months?: current use Intentional Self Injurious Behavior: None Risk to Others: Homicidal Ideation: No Thoughts of Harm to Others: No Current Homicidal Intent: No Current Homicidal Plan: No Access to Homicidal Means: No History of harm to others?: No Assessment of  Violence: None Noted Does patient have access to weapons?: No Criminal Charges Pending?: No Does patient have a court date: No Prior Inpatient Therapy: Prior Inpatient Therapy: Yes Prior Therapy Dates: 2014 last Prior Therapy Facilty[facility]Provider(s): [facility]history of multiple admits Reason for Treatment: psych Prior Outpatient Therapy: Prior Outpatient Therapy: Yes Prior Therapy Dates: current Prior Therapy Facilty/Provider(s): [facility] Reason for Treatment: psych Does patient have an ACCT team?: No Does patient have Intensive In-House Services?  : No Does patient have [facility] services? : No Does patient have P4CC services?: No  Past Medical History:  Past Medical History:  Diagnosis Date  . Alcohol abuse   . Anxiety   . Anxiety   . Anxiety and depression   . Asthma   . Bipolar disorder ([place])   . Borderline personality disorder ([place])   . Depression   . Diabetes ([place])    Type 2   . Dyslipidemia   . Esophageal varices ([place])   . Foot drop    right  . 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 GERD (gastroesophageal reflux disease)   . Gout   . History of drug abuse ([place])   . Insomnia   . Muscular dystrophies ([place])   . Neuropathy   . Pancreatitis   . 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 HERNIA REPAIR     Family History:  Family History  Problem Relation Age of Onset  . Heart disease Mother   . Heart attack Mother   . Hyperlipidemia Maternal Grandmother   . Diabetes Maternal Grandmother   . Hypertension Maternal Grandmother   . Hyperlipidemia Maternal Grandfather   . Heart disease Maternal Grandfather   . Heart attack Maternal Grandfather   . Diabetes Maternal Grandfather   . Lung cancer Maternal Grandfather   . Hypertension Maternal Grandfather   . Other Paternal Grandmother        Thyroid problems   . Heart disease Paternal Grandfather   . Stroke Paternal  Grandfather   . Hyperlipidemia Paternal Grandfather   . Heart attack Paternal Grandfather   . Hypertension Paternal Grandfather    Family Psychiatric  History:  Bipolar disorders Depression Alcohol abuse Anxiety disorder Social History:  Social History   Substance and Sexual Activity  Alcohol Use Not Currently   Comment: in rehab     Social History   Substance and Sexual Activity  Drug Use No   Comment: in rehab    Social History   Socioeconomic History  . Marital status: Legally Separated    Spouse name: Not on file  . Number of children: Not on file  . Years of education: Not on file  . Highest education level: Not on file  Occupational History  . Occupation: disabled  Social Needs  . Financial resource strain: Not on file  . Food insecurity    Worry: Not on file    Inability: Not on file  . Transportation needs    Medical: Not on file    Non-medical: Not on file  Tobacco Use  . Smoking status: Current Every Day Smoker    Packs/day: 0.50  . Smokeless tobacco: Never Used  Substance and Sexual Activity  . Alcohol use: Not Currently    Comment: in rehab  . Drug use: No    Comment: in rehab  . Sexual activity: Yes    Birth control/protection: None  Lifestyle  . Physical activity    Days per week: Not on file    Minutes per session: Not on file  . Stress: Not on file  Relationships  . Social Musician on phone: Not on file    Gets together: Not on file    Attends religious service: Not on file    Active member of club or organization: Not on file    Attends meetings of clubs or organizations: Not on file    Relationship status: Not on file  Other Topics Concern  . Not on file  Social History Narrative  . Not on file   Additional Social History:    Allergies:   Allergies  Allergen Reactions  . Erythromycin Anaphylaxis  . Sulfa Antibiotics Anaphylaxis  . Tylenol [Acetaminophen] Other (See Comments)    Pancreatitis and liver  dysfunction, not supposed to take med  . Nsaids Other (See Comments)    Causes GI problems, very bad reaction-per patient.   . Gabapentin     Mood changes     Labs:  Results for orders placed or performed during the hospital encounter of 09/09/18 (from the past 48 hour(s))  Comprehensive metabolic panel     Status: Abnormal   Collection Time: 09/09/18  6:02 PM  Result Value Ref Range   Sodium 133 (L) 135 - 145 mmol/L   Potassium 3.9 3.5 - 5.1 mmol/L   Chloride 104 98 - 111 mmol/L   CO2 16 (L) 22 - 32 mmol/L   Glucose, Bld 221 (H) 70 - 99 mg/dL   BUN 10 6 - 20 mg/dL   Creatinine, Ser 0.27 0.44 - 1.00 mg/dL   Calcium 8.8 (L) 8.9 - 10.3 mg/dL   Total Protein 7.1 6.5 - 8.1 g/dL   Albumin 3.9 3.5 - 5.0 g/dL   AST 69 (H) 15 - 41 U/L   ALT 19 0 - 44 U/L   Alkaline Phosphatase 69 38 - 126 U/L   Total Bilirubin 0.4 0.3 - 1.2 mg/dL   GFR calc non Af Amer >60 >60 mL/min   GFR calc Af Amer >60 >60 mL/min   Anion gap 13 5 - 15    Comment: Performed at [facility], [address] [place], [place] [address]  Ethanol     Status: None   Collection Time: 09/09/18  6:02 PM  Result Value Ref Range   Alcohol, Ethyl (B) <10 <10 mg/dL    Comment: (NOTE) Lowest detectable limit for serum alcohol is 10 mg/dL. For medical purposes only. Performed at [facility], [address] [place], [place] [address]   Salicylate level     Status: None   Collection Time: 09/09/18  6:02 PM  Result Value Ref Range   Salicylate Lvl <7.0 2.8 - 30.0 mg/dL    Comment: Performed at [facility], [address] [place], [place] [address]  Acetaminophen level     Status: Abnormal   Collection Time: 09/09/18  6:02 PM  Result Value Ref Range   Acetaminophen (Tylenol), Serum <10 (L) 10 - 30 ug/mL    Comment: (NOTE) Therapeutic concentrations vary significantly. A range of 10-30 ug/mL  may be an effective concentration for many patients. However, some  are best treated  at  concentrations outside of this range. Acetaminophen concentrations >150 ug/mL at 4 hours after ingestion  and >50 ug/mL at 12 hours after ingestion are often associated with  toxic reactions. Performed at [facility], [address] [place], [place] [address]   cbc     Status: Abnormal   Collection Time: 09/09/18  6:02 PM  Result Value Ref Range   WBC 12.0 (H) 4.0 - 10.5 K/uL   RBC 4.32 3.87 - 5.11 MIL/uL   Hemoglobin 13.3 12.0 - 15.0 g/dL   HCT 60.440.4 54.036.0 - 98.146.0 %   MCV 93.5 80.0 - 100.0 fL   MCH 30.8 26.0 - 34.0 pg   MCHC 32.9 30.0 - 36.0 g/dL   RDW 19.112.9 47.811.5 - 29.515.5 %   Platelets 254 150 - 400 K/uL   nRBC 0.0 0.0 - 0.2 %    Comment: Performed at [facility], [address] [place], [place] [address]  Urine Drug Screen, Qualitative     Status: Abnormal   Collection Time: 09/09/18  6:02 PM  Result Value Ref Range   Tricyclic, Ur Screen NONE DETECTED NONE DETECTED   Amphetamines, Ur Screen POSITIVE (A) NONE DETECTED   MDMA (Ecstasy)Ur Screen NONE DETECTED NONE DETECTED   Cocaine Metabolite,Ur [place] POSITIVE (A) NONE DETECTED   Opiate, Ur Screen NONE DETECTED NONE DETECTED   Phencyclidine (PCP) Ur S NONE DETECTED NONE DETECTED   Cannabinoid 50 Ng, Ur [place] POSITIVE (A) NONE DETECTED   Barbiturates, Ur Screen NONE DETECTED NONE DETECTED   Benzodiazepine, Ur Scrn POSITIVE (A) NONE DETECTED   Methadone Scn, Ur NONE DETECTED NONE DETECTED    Comment: (NOTE) Tricyclics + metabolites, urine    Cutoff 1000 ng/mL Amphetamines + metabolites, urine  Cutoff 1000 ng/mL MDMA (Ecstasy), urine              Cutoff 500 ng/mL Cocaine Metabolite, urine          Cutoff 300 ng/mL Opiate + metabolites, urine        Cutoff 300 ng/mL Phencyclidine (PCP), urine         Cutoff 25 ng/mL Cannabinoid, urine                 Cutoff 50 ng/mL Barbiturates + metabolites, urine  Cutoff 200 ng/mL Benzodiazepine, urine              Cutoff 200 ng/mL Methadone, urine                   Cutoff 300  ng/mL The urine drug screen provides only a preliminary, unconfirmed analytical test result and should not be used for non-medical purposes. Clinical consideration and professional judgment should be applied to any positive drug screen result due to possible interfering substances. A more specific alternate chemical method must be used in order to obtain a confirmed analytical result. Gas chromatography / mass spectrometry (GC/MS) is the preferred confirmat ory method. Performed at [facility], [address] [place], [place] [address]   Pregnancy, urine POC     Status: None   Collection Time: 09/09/18  6:12 PM  Result Value Ref Range   Preg Test, Ur NEGATIVE NEGATIVE    Comment:        THE SENSITIVITY OF THIS METHODOLOGY IS >24 mIU/mL     No current facility-administered medications for this encounter.    Current Outpatient Medications  Medication Sig Dispense Refill  . albuterol (PROVENTIL HFA;VENTOLIN HFA) 108 (90 BASE) MCG/ACT inhaler Inhale  2 puffs into the lungs every 6 (six) hours as needed for wheezing.    . ARIPiprazole (ABILIFY) 20 MG tablet Take 20 mg by mouth daily.    Marland Kitchen atorvastatin (LIPITOR) 40 MG tablet Take 40 mg by mouth daily.    . calcium-vitamin D (OSCAL WITH D) 500-200 MG-UNIT tablet Take 1 tablet by mouth.    . clonazePAM (KLONOPIN) 1 MG tablet Take 1 mg by mouth 3 (three) times daily as needed for anxiety.    . diphenoxylate-atropine (LOMOTIL) 2.5-0.025 MG tablet Take by mouth 4 (four) times daily as needed for diarrhea or loose stools.    Marland Kitchen esomeprazole (NEXIUM) 40 MG capsule Take 40 mg by mouth daily at 12 noon.    Marland Kitchen FLUoxetine (PROZAC) 40 MG capsule Take 80 mg by mouth daily.    . fluticasone (FLONASE) 50 MCG/ACT nasal spray Place into both nostrils daily.    Marland Kitchen LYRICA 50 MG capsule Take 50 mg by mouth 3 (three) times daily.    . metFORMIN (GLUCOPHAGE) 500 MG tablet Take 500 mg by mouth 2 (two) times daily with a meal.    . methocarbamol  (ROBAXIN) 500 MG tablet Take 500 mg by mouth 2 (two) times daily as needed.     . Multiple Vitamin (MULTIVITAMIN) capsule Take 1 capsule by mouth daily.    Mar[place] Kitchen PHENTERMINE HCL PO Take by mouth.    . topiramate (TOPAMAX) 200 MG tablet Take 200 mg by mouth at bedtime.    Mar[place] Kitchen zolpidem (AMBIEN) 10 MG tablet Take 10 mg by mouth at bedtime as needed for sleep.      Musculoskeletal: Strength & Muscle Tone: within normal limits Gait & Station: normal Patient leans: N/A  Psychiatric Specialty Exam: Physical Exam  Nursing note and vitals reviewed. Constitutional: She is oriented to person, place, and time. She appears well-developed and well-nourished.  HENT:  Head: Normocephalic.  Eyes: Pupils are equal, round, and reactive to light. Conjunctivae are normal.  Neck: Normal range of motion. Neck supple.  Cardiovascular: Normal rate and regular rhythm.  Respiratory: Effort normal.  Musculoskeletal: Normal range of motion.  Neurological: She is alert and oriented to person, place, and time.  Skin: Skin is warm and dry.    Review of Systems  Psychiatric/Behavioral: Positive for depression and substance abuse. The patient is nervous/anxious.   All other systems reviewed and are negative.   Blood pressure 140/85, pulse (!) 117, temperature 98.1 F (36.7 C), temperature source Oral, resp. rate 18, height  (1.702 m), weight 95.7 kg, SpO2 95 %.Body mass index is 33.05 kg/m.  General Appearance: Fairly Groomed  Eye Contact:  Minimal  Speech:  Garbled and Pressured  Volume:  Increased  Mood:  Anxious and Euphoric  Affect:  Full Range  Thought Process:  Disorganized  Orientation:  Full (Time, Place, and Person)  Thought Content:  Illogical, Delusions, Ilusions and Abstract Reasoning  Suicidal Thoughts:  No  Homicidal Thoughts:  No  Memory:  Immediate;   Fair Recent;   Fair  Judgement:  Poor  Insight:  Lacking  Psychomotor Activity:  Normal  Concentration:  Concentration: Fair and  Attention Span: Fair  Recall:  Good  Fund of Knowledge:  Fair  Language:  Fair  Akathisia:  NA  Handed:  Right  AIMS (if indicated):     Assets:  Desire for Improvement Resilience Social Support  ADL's:  Intact  Cognition:  Impaired,  Mild  Sleep:   Okay     Treatment Plan  Summary: Daily contact with patient to assess and evaluate symptoms and progress in treatment, Medication management and Plan Patient does meet criteria for psychiatric inpatient admission to assist with stabilization and treatment.  Disposition: Supportive therapy provided about ongoing stressors. Patient does meet criteria for psychiatric inpatient admission to assist with stabilization and treatment once a bed becomes available.  Catalina GravelJacqueline Thomspon, NP 09/09/2018 9:50 PM

## 2018-09-09 NOTE — ED Triage Notes (Signed)
Pt presents to ED via POV requesting psych eval. Pt denies specific SI/HI but states she feels like she is unsafe to be alone because she has been making bad choices to use alcohol, meth, and LSD.
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
## 2018-09-10 ENCOUNTER — Inpatient Hospital Stay
Admission: AD | Admit: 2018-09-10 | Discharge: 2018-09-15 | DRG: 885 | Disposition: A | Discharge status: Home / Self Care | Payer: Medicare Other | Attending: Psychiatry | Admitting: Psychiatry

## 2018-09-10 DIAGNOSIS — Z801 Family history of malignant neoplasm of trachea, bronchus and lung: Secondary | ICD-10-CM

## 2018-09-10 DIAGNOSIS — M109 Gout, unspecified: Secondary | ICD-10-CM | POA: Diagnosis present

## 2018-09-10 DIAGNOSIS — K219 Gastro-esophageal reflux disease without esophagitis: Secondary | ICD-10-CM | POA: Diagnosis present

## 2018-09-10 DIAGNOSIS — Z8249 Family history of ischemic heart disease and other diseases of the circulatory system: Secondary | ICD-10-CM | POA: Diagnosis not present

## 2018-09-10 DIAGNOSIS — F3112 Bipolar disorder, current episode manic without psychotic features, moderate: Principal | ICD-10-CM | POA: Diagnosis present

## 2018-09-10 DIAGNOSIS — F1010 Alcohol abuse, uncomplicated: Secondary | ICD-10-CM | POA: Diagnosis present

## 2018-09-10 DIAGNOSIS — Z1159 Encounter for screening for other viral diseases: Secondary | ICD-10-CM | POA: Diagnosis not present

## 2018-09-10 DIAGNOSIS — F10939 Alcohol use, unspecified with withdrawal, unspecified: Secondary | ICD-10-CM | POA: Diagnosis present

## 2018-09-10 DIAGNOSIS — G7102 Facioscapulohumeral muscular dystrophy: Secondary | ICD-10-CM | POA: Diagnosis present

## 2018-09-10 DIAGNOSIS — Z8349 Family history of other endocrine, nutritional and metabolic diseases: Secondary | ICD-10-CM

## 2018-09-10 DIAGNOSIS — F10239 Alcohol dependence with withdrawal, unspecified: Secondary | ICD-10-CM | POA: Diagnosis present

## 2018-09-10 DIAGNOSIS — E119 Type 2 diabetes mellitus without complications: Secondary | ICD-10-CM | POA: Diagnosis present

## 2018-09-10 DIAGNOSIS — F1510 Other stimulant abuse, uncomplicated: Secondary | ICD-10-CM

## 2018-09-10 DIAGNOSIS — Z823 Family history of stroke: Secondary | ICD-10-CM | POA: Diagnosis not present

## 2018-09-10 DIAGNOSIS — Z833 Family history of diabetes mellitus: Secondary | ICD-10-CM | POA: Diagnosis not present

## 2018-09-10 LAB — SARS CORONAVIRUS 2 BY RT PCR (HOSPITAL ORDER, PERFORMED IN ~~LOC~~ HOSPITAL LAB): SARS Coronavirus 2: NEGATIVE

## 2018-09-10 MED ORDER — ALUM & MAG HYDROXIDE-SIMETH 200-200-20 MG/5ML PO SUSP: 30.0000 mL | ORAL | Status: DC | PRN

## 2018-09-10 MED ORDER — NORGESTIM-ETH ESTRAD TRIPHASIC 0.18/0.215/0.25 MG-35 MCG PO TABS: 1.0000 | ORAL_TABLET | Freq: Every day | ORAL | Status: DC

## 2018-09-10 MED ORDER — ATORVASTATIN CALCIUM 20 MG PO TABS
40.0000 mg | ORAL_TABLET | Freq: Every day | ORAL | Status: DC
  Administered 2018-09-10 – 2018-09-15 (×6): 40 mg via ORAL
  Filled 2018-09-10 (×6): qty 2

## 2018-09-10 MED ORDER — PREGABALIN 25 MG PO CAPS
50.0000 mg | ORAL_CAPSULE | Freq: Three times a day (TID) | ORAL | Status: DC
  Administered 2018-09-10 – 2018-09-15 (×16): 50 mg via ORAL
  Filled 2018-09-10 (×16): qty 2

## 2018-09-10 MED ORDER — METFORMIN HCL 500 MG PO TABS
500.0000 mg | ORAL_TABLET | Freq: Two times a day (BID) | ORAL | Status: DC
  Administered 2018-09-10 – 2018-09-15 (×11): 500 mg via ORAL
  Filled 2018-09-10 (×11): qty 1

## 2018-09-10 MED ORDER — ADULT MULTIVITAMIN W/MINERALS CH
1.0000 | ORAL_TABLET | Freq: Every day | ORAL | Status: DC
  Administered 2018-09-10 – 2018-09-15 (×6): 1 via ORAL
  Filled 2018-09-10 (×6): qty 1

## 2018-09-10 MED ORDER — CALCIUM CARBONATE-VITAMIN D 500-200 MG-UNIT PO TABS
1.0000 | ORAL_TABLET | Freq: Every day | ORAL | Status: DC
  Administered 2018-09-10 – 2018-09-15 (×6): 1 via ORAL
  Filled 2018-09-10 (×6): qty 1

## 2018-09-10 MED ORDER — CARIPRAZINE HCL 3 MG PO CAPS
3.0000 mg | ORAL_CAPSULE | Freq: Every day | ORAL | Status: DC
  Administered 2018-09-10 – 2018-09-15 (×6): 3 mg via ORAL
  Filled 2018-09-10 (×6): qty 1

## 2018-09-10 MED ORDER — CLONAZEPAM 1 MG PO TABS
1.0000 mg | ORAL_TABLET | Freq: Three times a day (TID) | ORAL | Status: DC | PRN
  Administered 2018-09-11 – 2018-09-15 (×7): 1 mg via ORAL
  Filled 2018-09-10 (×7): qty 1

## 2018-09-10 MED ORDER — ALBUTEROL SULFATE HFA 108 (90 BASE) MCG/ACT IN AERS
2.0000 | INHALATION_SPRAY | Freq: Four times a day (QID) | RESPIRATORY_TRACT | Status: DC | PRN
  Filled 2018-09-10: qty 6.7

## 2018-09-10 MED ORDER — PANTOPRAZOLE SODIUM 40 MG PO TBEC
40.0000 mg | DELAYED_RELEASE_TABLET | Freq: Every day | ORAL | Status: DC
  Administered 2018-09-10 – 2018-09-15 (×6): 40 mg via ORAL
  Filled 2018-09-10 (×6): qty 1

## 2018-09-10 MED ORDER — MAGNESIUM HYDROXIDE 400 MG/5ML PO SUSP: 30.0000 mL | Freq: Every day | ORAL | Status: DC | PRN

## 2018-09-10 MED ORDER — DIPHENOXYLATE-ATROPINE 2.5-0.025 MG PO TABS
2.0000 | ORAL_TABLET | Freq: Four times a day (QID) | ORAL | Status: DC | PRN
  Filled 2018-09-10: qty 2

## 2018-09-10 MED ORDER — HYDROXYZINE HCL 25 MG PO TABS: 25.0000 mg | ORAL_TABLET | Freq: Four times a day (QID) | ORAL | Status: DC | PRN

## 2018-09-10 MED ORDER — ZOLPIDEM TARTRATE 5 MG PO TABS
5.0000 mg | ORAL_TABLET | Freq: Every day | ORAL | Status: DC
  Administered 2018-09-10 – 2018-09-14 (×5): 5 mg via ORAL
  Filled 2018-09-10 (×5): qty 1

## 2018-09-10 MED ORDER — TOPIRAMATE 100 MG PO TABS
200.0000 mg | ORAL_TABLET | Freq: Every day | ORAL | Status: DC
  Administered 2018-09-10 – 2018-09-14 (×5): 200 mg via ORAL
  Filled 2018-09-10 (×5): qty 2

## 2018-09-10 MED ORDER — FLUTICASONE PROPIONATE 50 MCG/ACT NA SUSP
1.0000 | Freq: Every day | NASAL | Status: DC
  Administered 2018-09-10 – 2018-09-14 (×4): 1 via NASAL
  Filled 2018-09-10: qty 16

## 2018-09-10 MED ORDER — ARIPIPRAZOLE 10 MG PO TABS
20.0000 mg | ORAL_TABLET | Freq: Every day | ORAL | Status: DC
  Filled 2018-09-10: qty 2

## 2018-09-10 MED ORDER — FLUOXETINE HCL 20 MG PO CAPS
80.0000 mg | ORAL_CAPSULE | Freq: Every day | ORAL | Status: DC
  Administered 2018-09-10 – 2018-09-15 (×6): 80 mg via ORAL
  Filled 2018-09-10 (×8): qty 4

## 2018-09-10 NOTE — Progress Notes (Signed)
Recreation Therapy Notes  INPATIENT RECREATION THERAPY ASSESSMENT  Patient Details Name: [name] MRN: [id number] DOB: 1975-12-16 Today's Date: 09/10/2018       Information Obtained From: Patient  Able to Participate in Assessment/Interview: Yes  Patient Presentation: Responsive  Reason for Admission (Per Patient): Active Symptoms, Suicidal Ideation, Substance Abuse  Patient Stressors: Other (Comment)(Room mate)  Coping Skills:   Substance Abuse, Talk  Leisure Interests (2+):  Individual - Reading, Games - Cross-word  Frequency of Recreation/Participation:    Awareness of Community Resources:     [employer]:     Current Use:    If no, Barriers?:    Expressed Interest in [place] of Residence:  Insurance underwriter  Patient Main Form of Transportation: Other (Comment)(My cousin)  Patient Strengths:  Compassonate  Patient Identified Areas of Improvement:  Get ride of my crazy thinking  Patient Goal for Hospitalization:  To feel safe when I go home  Current SI (including self-harm):  No  Current HI:  No  Current AVH: No  Staff Intervention Plan: Group Attendance, Collaborate with Interdisciplinary Treatment Team  Consent to Intern Participation: N/A  Rita Prom 09/10/2018, 1:11 PM

## 2018-09-10 NOTE — Plan of Care (Signed)
Working on  anxiety, coping and decision  making  techniques  to improve thoughts  activities to help reduce stress, denies suicidal ideations . Complaint  with medications given this shift  participatory with unit programing  attending  unit therapy group  opportunity to vent feelings .   Voice of no safety  concerns . Problem: Safety: Goal: Ability to remain free from injury will improve Outcome: Progressing   Problem: Physical Regulation: Goal: Complications related to the disease process, condition or treatment will be avoided or minimized Outcome: Progressing   Problem: Health Behavior/Discharge Planning: Goal: Ability to identify changes in lifestyle to reduce recurrence of condition will improve Outcome: Progressing Goal: Identification of resources available to assist in meeting health care needs will improve Outcome: Progressing   Problem: Education: Goal: Knowledge of disease or condition will improve Outcome: Progressing Goal: Understanding of discharge needs will improve Outcome: Progressing   Problem: Self-Concept: Goal: Ability to identify factors that promote anxiety will improve Outcome: Progressing Goal: Level of anxiety will decrease Outcome: Progressing Goal: Ability to modify response to factors that promote anxiety will improve Outcome: Progressing   Problem: Coping: Goal: Ability to identify and develop effective coping behavior will improve Outcome: Progressing   Problem: Education: Goal: Ability to state activities that reduce stress will improve Outcome: Progressing   Problem: Self-Concept: Goal: Will verbalize positive feelings about self Outcome: Progressing Goal: Level of anxiety will decrease Outcome: Progressing   Problem: Safety: Goal: Ability to disclose and discuss suicidal ideas will improve Outcome: Progressing Goal: Ability to identify and utilize support systems that promote safety will improve Outcome: Progressing   Problem: Role  Relationship: Goal: Will demonstrate positive changes in social behaviors and relationships Outcome: Progressing   Problem: Health Behavior/Discharge Planning: Goal: Ability to make decisions will improve Outcome: Progressing Problem: Coping: Goal: Coping ability will improve Outcome: Progressing Goal: Will verbalize feelings Outcome: Progressing   Problem: Activity: Goal: Interest or engagement in leisure activities will improve Outcome: Progressing Goal: Imbalance in normal sleep/wake cycle will improve Outcome: Progressing   Problem: Self-Concept: Goal: Ability to disclose and discuss suicidal ideas will improve Outcome: Progressing Goal: Will verbalize positive feelings about self Outcome: Progressing   Problem: Health Behavior/Discharge Planning: Goal: Identification of resources available to assist in meeting health care needs will improve Outcome: Progressing   Problem: Coping: Goal: Coping ability will improve Outcome: Progressing

## 2018-09-10 NOTE — Progress Notes (Signed)
New admit from home alert and oriented x 4,  admitted voluntarily committed for  expressing insane thoughts and feels that she is mentally unstable, reports having symptoms of  Mania, alcohol and drug use , UDS is positive for benzo, cocaine, amphetamine, and cannabis. Patient hsa a hx. Of Bi-polar disorder, diabetes type 2 ,depression and anxiety, and others. Patient denies any suicidal, homicidal thoughts and no signs of delusions or hallucinations, affect is good and mood appear depressed, patient is cooperating with assessment process, contract for safety of self and others search is done and skin is checked by two nurses and no contraband found and skin is clean, patient is walking with a limp on her right leg due to FSHD. Unit and room orientation is complete, unit and safety guidelines is complete, food and nourishment is provided. Patient is informed about 15 minutes safety rounding and camera watch on the hall ways noted. Patient is comfortable in room 323. Dr. Weber Cooks is the primary.
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
## 2018-09-10 NOTE — [facility] Suicide Risk Assessment (Signed)
[place] INPATIENT:  Family/Significant Other Suicide Prevention Education  Suicide Prevention Education:  Contact Attempts: Louanna Raw, cousin [phone] has been identified by the patient as the family member/significant other with whom the patient will be residing, and identified as the person(s) who will aid the patient in the event of a mental health crisis.  With written consent from the patient, two attempts were made to provide suicide prevention education, prior to and/or following the patient's discharge.  We were unsuccessful in providing suicide prevention education.  A suicide education pamphlet was given to the patient to share with family/significant other.  Date and time of first attempt: 09/10/2018 at 11:05 AM Date and time of second attempt:  CSW left a HIPAA compliant voicemail requesting a call back.   [place] MSW LCSW 09/10/2018, 11:05 AM

## 2018-09-10 NOTE — ED Notes (Signed)
Hourly rounding reveals patient sleeping in room. No complaints, stable, in no acute distress. Q15 minute rounds and monitoring via Security Cameras to continue. 

## 2018-09-10 NOTE — [facility] Group Notes (Signed)
Emotional Regulation 09/10/2018 1PM  Type of Therapy/Topic:  Group Therapy:  Emotion Regulation  Participation Level:  Minimal   Description of Group:   The purpose of this group is to assist patients in learning to regulate negative emotions and experience positive emotions. Patients will be guided to discuss ways in which they have been vulnerable to their negative emotions. These vulnerabilities will be juxtaposed with experiences of positive emotions or situations, and patients will be challenged to use positive emotions to combat negative ones. Special emphasis will be placed on coping with negative emotions in conflict situations, and patients will process healthy conflict resolution skills.  Therapeutic Goals: 1. Patient will identify two positive emotions or experiences to reflect on in order to balance out negative emotions 2. Patient will label two or more emotions that they find the most difficult to experience 3. Patient will demonstrate positive conflict resolution skills through discussion and/or role plays  Summary of Patient Progress:  Minimal participation during session, pt slept during session. Pt states she is going to nominate someone to assist her with budgeting. Pt says she becomes triggered when she runs out of money before the end of the next pay period.     Therapeutic Modalities:   Cognitive Behavioral Therapy Feelings Identification Dialectical Behavioral Therapy   Yvette Rack, LCSW 09/10/2018 1:51 PM
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
## 2018-09-10 NOTE — Progress Notes (Signed)
PHARMACIST - PHYSICIAN ORDER COMMUNICATION  CONCERNING: P&T Medication Policy for Ambien (Zolpidem)  DESCRIPTION:  This patient's order for:  Ambien (Zolpidem) 10mg  QHS  has been noted.  For female patients and patients age 42 years or older, dosage of zolpidem automatically limited to 5 mg.  ACTION TAKEN: The pharmacy department has adjusted the dose of Zolpidem to 5mg  per policy.   Rowland Lathe ,PharmD Clinical Pharmacist 09/10/2018 5:56 PM

## 2018-09-10 NOTE — Progress Notes (Signed)
Recreation Therapy Notes  Date: 09/10/2018  Time: 9:30 am  Location: Craft room  Behavioral response: Appropriate  Intervention Topic: Time Management  Discussion/Intervention:  Group content today was focused on time management. The group defined time management and identified healthy ways to manage time. Individuals expressed how much of the 24 hours they use in a day. Patients expressed how much time they use just for themselves personally. The group expressed how they have managed their time in the past. Individuals participated in the intervention "Managing Life" where they had a chance to see how much of the 24 hours they use and where it goes. Clinical Observations/Feedback:  Patient came to group and was focused on what peers and staff had to say about time management. She expressed that because she get disability she stays at home and does not do much.  Individual was social with peers and staff while participating in the intervention. [name] LRT/CTRS         [name] 09/10/2018 11:48 AM

## 2018-09-10 NOTE — BHH Suicide Risk Assessment (Signed)
[place] INPATIENT:  Family/Significant Other Suicide Prevention Education  Suicide Prevention Education:  Education Completed; [name], cousin [phone] has been identified by the patient as the family member/significant other with whom the patient will be residing, and identified as the person(s) who will aid the patient in the event of a mental health crisis (suicidal ideations/suicide attempt).  With written consent from the patient, the family member/significant other has been provided the following suicide prevention education, prior to the and/or following the discharge of the patient.  The suicide prevention education provided includes the following:  Suicide risk factors  Suicide prevention and interventions  National Suicide Hotline telephone number  [facility] assessment telephone number  [facility] Emergency Assistance [place] and/or Residential Mobile Crisis Unit telephone number  Request made of family/significant other to:  Remove weapons (e.g., guns, rifles, knives), all items previously/currently identified as safety concern.    Remove drugs/medications (over-the-counter, prescriptions, illicit drugs), all items previously/currently identified as a safety concern.  The family member/significant other verbalizes understanding of the suicide prevention education information provided.  The family member/significant other agrees to remove the items of safety concern listed above.  CSW spoke with pts cousin, Arrie Aran who reported that pt is in the hospital due to stress and that pt has been dealing with Mental illness for a while and she has seen pt in a manic stage and this is the first time she has seen her in a depressed stage. Dawn denied any concerns for SI/HI/ or with pt returning home at discharge. Dawn also denied guns/weapons being in the home.   Gila MSW LCSW 09/10/2018, 11:20 AM

## 2018-09-10 NOTE — Progress Notes (Signed)
Pt accepted to [facility] Room 323 Accepting: Ysidro Evert Attending: Dr Weber Cooks TTS spoke with Otilio Jefferson, RN, on [facility]U and Dominica Severin RN on [place] to call report. Winferd Humphrey, MSW, LCSW Clinical Social Worker 09/10/2018 12:28 AM

## 2018-09-10 NOTE — Progress Notes (Signed)
D: Patient stated slept fair last night .Stated appetite fair and energy level  Is normal. Stated concentration good . Stated on Depression scale 3 , hopeless 3 and anxiety 3.( low 0-10 high) Denies suicidal  homicidal ideations  .  No auditory hallucinations  No pain concerns . Appropriate ADL'S. Interacting with peers and staff. Working on  anxiety, coping and decision  making  techniques  to improve thoughts  activities to help reduce stress, denies suicidal ideations . Complaint  with medications given this shift  participatory with unit programing  attending  unit therapy group  opportunity to vent feelings .   Voice of no safety  concerns .  A: Encourage patient participation with unit programming . Instruction  Given on  Medication , verbalize understanding.  R: Voice no other concerns. Staff continue to monitor

## 2018-09-10 NOTE — Tx Team (Addendum)
Interdisciplinary Treatment and Diagnostic Plan Update  09/10/2018 Time of Session: [place] MRN: [id number]  Principal Diagnosis: <principal problem not specified>  Secondary Diagnoses: Active Problems:   Bipolar 1 disorder, manic, moderate ([name])   Current Medications:  Current Facility-Administered Medications  Medication Dose Route Frequency Provider Last Rate Last Dose  . albuterol (VENTOLIN HFA) 108 (90 Base) MCG/ACT inhaler 2 puff  2 puff Inhalation Q6H PRN Lamont Dowdy, NP      . alum & mag hydroxide-simeth (MAALOX/MYLANTA) 200-200-20 MG/5ML suspension 30 mL  30 mL Oral Q4H PRN Thomspon, Geni Bers, NP      . ARIPiprazole (ABILIFY) tablet 20 mg  20 mg Oral Daily Thomspon, Geni Bers, NP      . atorvastatin (LIPITOR) tablet 40 mg  40 mg Oral Daily Lamont Dowdy, NP   40 mg at 09/10/18 0820  . calcium-vitamin D (OSCAL WITH D) 500-200 MG-UNIT per tablet 1 tablet  1 tablet Oral Q breakfast Lamont Dowdy, NP   1 tablet at 09/10/18 [phone]  . cariprazine (VRAYLAR) capsule 3 mg  3 mg Oral Daily Lamont Dowdy, NP   3 mg at 09/10/18 0820  . diphenoxylate-atropine (LOMOTIL) 2.5-0.025 MG per tablet 2 tablet  2 tablet Oral QID PRN Lamont Dowdy, NP      . FLUoxetine (PROZAC) capsule 80 mg  80 mg Oral Daily Lamont Dowdy, NP   80 mg at 09/10/18 3299  . fluticasone (FLONASE) 50 MCG/ACT nasal spray 1 spray  1 spray Each Nare Daily Lamont Dowdy, NP   1 spray at 09/10/18 2426  . hydrOXYzine (ATARAX/VISTARIL) tablet 25 mg  25 mg Oral Q6H PRN Lamont Dowdy, NP      . magnesium hydroxide (MILK OF MAGNESIA) suspension 30 mL  30 mL Oral Daily PRN Lamont Dowdy, NP      . metFORMIN (GLUCOPHAGE) tablet 500 mg  500 mg Oral BID WC Lamont Dowdy, NP   500 mg at 09/10/18 0820  . multivitamin with minerals tablet 1 tablet  1 tablet Oral Daily Lamont Dowdy, NP   1 tablet at 09/10/18 0820  . pantoprazole (PROTONIX) EC tablet 40  mg  40 mg Oral Daily Lamont Dowdy, NP   40 mg at 09/10/18 0820  . pregabalin (LYRICA) capsule 50 mg  50 mg Oral TID Lamont Dowdy, NP   50 mg at 09/10/18 1447  . topiramate (TOPAMAX) tablet 200 mg  200 mg Oral QHS Thomspon, Geni Bers, NP       PTA Medications: Medications Prior to Admission  Medication Sig Dispense Refill Last Dose  . albuterol (PROVENTIL HFA;VENTOLIN HFA) 108 (90 BASE) MCG/ACT inhaler Inhale 2 puffs into the lungs every 6 (six) hours as needed for wheezing.     . ARIPiprazole (ABILIFY) 20 MG tablet Take 20 mg by mouth daily.     Marland Kitchen atorvastatin (LIPITOR) 40 MG tablet Take 40 mg by mouth daily.     . calcium-vitamin D (OSCAL WITH D) 500-200 MG-UNIT tablet Take 1 tablet by mouth.     . cariprazine (VRAYLAR) capsule Take 3 mg by mouth daily.     . clonazePAM (KLONOPIN) 1 MG tablet Take 1 mg by mouth 3 (three) times daily as needed for anxiety.     . diphenoxylate-atropine (LOMOTIL) 2.5-0.025 MG tablet Take by mouth 4 (four) times daily as needed for diarrhea or loose stools.     Marland Kitchen esomeprazole (NEXIUM) 40 MG capsule Take 40 mg by mouth daily at 12 noon.     Marland Kitchen FLUoxetine (PROZAC) 40  MG capsule Take 80 mg by mouth daily.     . fluticasone (FLONASE) 50 MCG/ACT nasal spray Place into both nostrils daily.     Marland Kitchen LYRICA 50 MG capsule Take 50 mg by mouth 3 (three) times daily.     . metFORMIN (GLUCOPHAGE) 500 MG tablet Take 500 mg by mouth 2 (two) times daily with a meal.     . methocarbamol (ROBAXIN) 500 MG tablet Take 500 mg by mouth 2 (two) times daily as needed.      . Multiple Vitamin (MULTIVITAMIN) capsule Take 1 capsule by mouth daily.     . Norgestimate-Ethinyl Estradiol Triphasic (TRI-SPRINTEC) 0.18/0.215/0.25 MG-35 MCG tablet Take 1 tablet by mouth daily.     Marland Kitchen PHENTERMINE HCL PO Take by mouth.     . topiramate (TOPAMAX) 200 MG tablet Take 200 mg by mouth at bedtime.     Marland Kitchen zolpidem (AMBIEN) 10 MG tablet Take 10 mg by mouth at bedtime as needed for sleep.        Patient Stressors: Financial difficulties Medication change or noncompliance Substance abuse  Patient Strengths: Capable of independent living Agricultural engineer for treatment/growth  Treatment Modalities: Medication Management, Group therapy, Case management,  1 to 1 session with clinician, Psychoeducation, Recreational therapy.   Physician Treatment Plan for Primary Diagnosis: <principal problem not specified> Long Term Goal(s):     Short Term Goals:    Medication Management: Evaluate patient's response, side effects, and tolerance of medication regimen.  Therapeutic Interventions: 1 to 1 sessions, Unit Group sessions and Medication administration.  Evaluation of Outcomes: Not Met  Physician Treatment Plan for Secondary Diagnosis: Active Problems:   Bipolar 1 disorder, manic, moderate ([place])  Long Term Goal(s):     Short Term Goals:       Medication Management: Evaluate patient's response, side effects, and tolerance of medication regimen.  Therapeutic Interventions: 1 to 1 sessions, Unit Group sessions and Medication administration.  Evaluation of Outcomes: Not Met   RN Treatment Plan for Primary Diagnosis: <principal problem not specified> Long Term Goal(s): Knowledge of disease and therapeutic regimen to maintain health will improve  Short Term Goals: Ability to participate in decision making will improve, Ability to verbalize feelings will improve, Ability to disclose and discuss suicidal ideas, Ability to identify and develop effective coping behaviors will improve and Compliance with prescribed medications will improve  Medication Management: RN will administer medications as ordered by provider, will assess and evaluate patient's response and provide education to patient for prescribed medication. RN will report any adverse and/or side effects to prescribing provider.  Therapeutic Interventions: 1 on 1 counseling sessions, Psychoeducation,  Medication administration, Evaluate responses to treatment, Monitor vital signs and CBGs as ordered, Perform/monitor CIWA, COWS, AIMS and Fall Risk screenings as ordered, Perform wound care treatments as ordered.  Evaluation of Outcomes: Not Met   LCSW Treatment Plan for Primary Diagnosis: <principal problem not specified> Long Term Goal(s): Safe transition to appropriate next level of care at discharge, Engage patient in therapeutic group addressing interpersonal concerns.  Short Term Goals: Engage patient in aftercare planning with referrals and resources  Therapeutic Interventions: Assess for all discharge needs, 1 to 1 time with Social worker, Explore available resources and support systems, Assess for adequacy in community support network, Educate family and significant other(s) on suicide prevention, Complete Psychosocial Assessment, Interpersonal group therapy.  Evaluation of Outcomes: Not Met   Progress in Treatment: Attending groups: Yes. Participating in groups: Yes. Taking medication as prescribed: Yes. Toleration medication:  Yes. Family/Significant other contact made: Yes, individual(s) contacted:  Louanna Raw, cousin Patient understands diagnosis: Yes. Discussing patient identified problems/goals with staff: Yes. Medical problems stabilized or resolved: No. Denies suicidal/homicidal ideation: No. Issues/concerns per patient self-inventory: No. Other: NA  New problem(s) identified: No, Describe:  none reported  New Short Term/Long Term Goal(s):Attend outpatient treatment, take medication as prescribed and develop and implement healthy coping methods  Patient Goals:  "Make better decisions and go to group"  Discharge Plan or Barriers: Pt will return home and resume outpatient treatment with [facility] and [facility]  Reason for Continuation of Hospitalization: Depression Mania Medication stabilization  Estimated Length of Stay:   Recreational  Therapy: Patient Stressors: Room mate  Patient Goal: Patient will successfully identify 2 ways of making healthy decisions post d/c within 5 recreation therapy group sessions  Attendees: Patient:[name] 09/10/2018 3:07 PM  Physician: Alethia Berthold 09/10/2018 3:07 PM  Nursing:  09/10/2018 3:07 PM  RN Care Manager: 09/10/2018 3:07 PM  Social Worker: Sanjuana Kava [place] 09/10/2018 3:07 PM  Recreational Therapist: Isaias Sakai Jaylenn Altier 09/10/2018 3:07 PM  Other:  09/10/2018 3:07 PM  Other:  09/10/2018 3:07 PM  Other: 09/10/2018 3:07 PM    Scribe for Treatment Team: Yvette Rack, LCSW 09/10/2018 3:07 PM

## 2018-09-10 NOTE — Tx Team (Signed)
Initial Treatment Plan 09/10/2018 3:32 AM [name] WNI:[id number]    PATIENT STRESSORS: Financial difficulties Medication change or noncompliance Substance abuse   PATIENT STRENGTHS: Capable of independent living Communication skills Motivation for treatment/growth   PATIENT IDENTIFIED PROBLEMS:   Illicit Drug use    Depression/Anxiety    Alcohol miss use    Suicidal thoughts       DISCHARGE CRITERIA:  Adequate post-discharge living arrangements Medical problems require only outpatient monitoring Motivation to continue treatment in a less acute level of care Reduction of life-threatening or endangering symptoms to within safe limits  PRELIMINARY DISCHARGE PLAN: Attend 12-step recovery group Participate in family therapy Return to previous living arrangement  PATIENT/FAMILY INVOLVEMENT: This treatment plan has been presented to and reviewed with the patient, [name],   The patient  have been given the opportunity to ask questions and make suggestions.  Clemens Catholic, RN 09/10/2018, 3:32 AM

## 2018-09-10 NOTE — Progress Notes (Signed)
Inpatient Diabetes Program Recommendations  AACE/ADA: New Consensus Statement on Inpatient Glycemic Control  Target Ranges:  Prepandial:   less than 140 mg/dL      Peak postprandial:   less than 180 mg/dL (1-2 hours)      Critically ill patients:  140 - 180 mg/dL   Results for [name], [name] (MRN [id number]) as of 09/10/2018 10:37  Ref. Range 09/09/2018 18:02  Glucose Latest Ref Range: 70 - 99 mg/dL 221 (H)   Review of Glycemic Control  Diabetes history: DM2 Outpatient Diabetes medications: Metformin 500 mg BID Current orders for Inpatient glycemic control: Metformin 500 mg BID  Inpatient Diabetes Program Recommendations:   Correction (SSI): Please consider ordering CBGs AC&HS with Novolog 0-9 units TID with meals and Novolog 0-5 units QHS.  Diet: Please discontinue Regular diet and order Carb Modified diet.  Thanks, Barnie Alderman, RN, MSN, CDE Diabetes Coordinator Inpatient Diabetes Program [phone] (Team Pager from 8am to 5pm)

## 2018-09-10 NOTE — H&P (Signed)
Psychiatric Admission Assessment Adult  Patient Identification: [name] MRN:  [id number] Date of Evaluation:  09/10/2018 Chief Complaint:  Bipolar Principal Diagnosis: Bipolar 1 disorder, manic, moderate ([name]) Diagnosis:  Principal Problem:   Bipolar 1 disorder, manic, moderate ([name]) Active Problems:   Alcohol withdrawal ([name])   Alcohol abuse   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[name])   Amphetamine abuse ([name])   Diabetes ([name])  History of Present Illness: Patient seen and chart reviewed.  Patient presented to the hospital with mood instability disorganized thinking and a feeling of being unsafe.  Had relapsed into drug abuse for several days.  Not sleeping for days.  Hallucinating feeling disorganized vague suicidal thoughts. Associated Signs/Symptoms: Depression Symptoms:  psychomotor agitation, feelings of worthlessness/guilt, difficulty concentrating, suicidal thoughts without plan, (Hypo) Manic Symptoms:  Distractibility, Irritable Mood, Labiality of Mood, Anxiety Symptoms:  Excessive Worry, Psychotic Symptoms:  Hallucinations: Auditory Paranoia, PTSD Symptoms: Negative Total Time spent with patient: 1 hour  Past Psychiatric History: Patient has a long history of chronic mental illness of bipolar disorder and substance abuse.  Had had a hospitalization relatively recently with depression.  Sees an outpatient psychiatrist regularly.  Does have a past history of suicide attempts  Is the patient at risk to self? Yes.    Has the patient been a risk to self in the past 6 months? Yes.    Has the patient been a risk to self within the distant past? Yes.    Is the patient a risk to others? No.  Has the patient been a risk to others in the past 6 months? No.  Has the patient been a risk to others within the distant past? No.   Prior Inpatient Therapy:   Prior Outpatient Therapy:    Alcohol Screening: 1. How often do you have a drink containing alcohol?: 2 to 4  times a month 2. How many drinks containing alcohol do you have on a typical day when you are drinking?: 3 or 4 3. How often do you have six or more drinks on one occasion?: Less than monthly AUDIT-C Score: 4 4. How often during the last year have you found that you were not able to stop drinking once you had started?: Less than monthly 5. How often during the last year have you failed to do what was normally expected from you becasue of drinking?: Less than monthly 6. How often during the last year have you needed a first drink in the morning to get yourself going after a heavy drinking session?: Less than monthly 7. How often during the last year have you had a feeling of guilt of remorse after drinking?: Less than monthly 8. How often during the last year have you been unable to remember what happened the night before because you had been drinking?: Less than monthly 9. Have you or someone else been injured as a result of your drinking?: No 10. Has a relative or friend or a doctor or another health worker been concerned about your drinking or suggested you cut down?: No Alcohol Use Disorder Identification Test Final Score (AUDIT): 9 Alcohol Brief Interventions/Follow-up: Alcohol Education Substance Abuse History in the last 12 months:  Yes.   Consequences of Substance Abuse: Medical Consequences:  Severe worsening of mental illness Previous Psychotropic Medications: Yes  Psychological Evaluations: Yes  Past Medical History:  Past Medical History:  Diagnosis Date  . Alcohol abuse   . Anxiety   . Anxiety   . Anxiety and depression   .  Asthma   . Bipolar disorder ([place])   . Borderline personality disorder ([place])   . Depression   . Diabetes ([place])    Type 2   . Dyslipidemia   . Esophageal varices ([place])   . Foot drop    right  . 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[place])   . GERD (gastroesophageal reflux disease)   . Gout   . History of drug abuse ([place])   . Insomnia   .  Muscular dystrophies ([place])   . Neuropathy   . Pancreatitis   . 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 HERNIA REPAIR     Family History:  Family History  Problem Relation Age of Onset  . Heart disease Mother   . Heart attack Mother   . Hyperlipidemia Maternal Grandmother   . Diabetes Maternal Grandmother   . Hypertension Maternal Grandmother   . Hyperlipidemia Maternal Grandfather   . Heart disease Maternal Grandfather   . Heart attack Maternal Grandfather   . Diabetes Maternal Grandfather   . Lung cancer Maternal Grandfather   . Hypertension Maternal Grandfather   . Other Paternal Grandmother        Thyroid problems   . Heart disease Paternal Grandfather   . Stroke Paternal Grandfather   . Hyperlipidemia Paternal Grandfather   . Heart attack Paternal Grandfather   . Hypertension Paternal Grandfather    Family Psychiatric  History: None reported Tobacco Screening: Have you used any form of tobacco in the last 30 days? (Cigarettes, Smokeless Tobacco, Cigars, and/or Pipes): Yes Tobacco use, Select all that apply: 5 or more cigarettes per day Are you interested in Tobacco Cessation Medications?: Yes, will notify MD for an order Counseled patient on smoking cessation including recognizing danger situations, developing coping skills and basic information about quitting provided: Yes Social History:  Social History   Substance and Sexual Activity  Alcohol Use Not Currently   Comment: in rehab     Social History   Substance and Sexual Activity  Drug Use No   Comment: in rehab    Additional Social History: Marital status: Divorced Divorced, when?: 2011 Are you sexually active?: Yes What is your sexual orientation?: heterosexual Does patient have children?: No                         Allergies:   Allergies  Allergen Reactions  . Erythromycin Anaphylaxis  . Sulfa Antibiotics Anaphylaxis  . Tylenol  [Acetaminophen] Other (See Comments)    Pancreatitis and liver dysfunction, not supposed to take med  . Nsaids Other (See Comments)    Causes GI problems, very bad reaction-per patient.   . Gabapentin     Mood changes    Lab Results:  Results for orders placed or performed during the hospital encounter of 09/09/18 (from the past 48 hour(s))  Comprehensive metabolic panel     Status: Abnormal   Collection Time: 09/09/18  6:02 PM  Result Value Ref Range   Sodium 133 (L) 135 - 145 mmol/L   Potassium 3.9 3.5 - 5.1 mmol/L   Chloride 104 98 - 111 mmol/L   CO2 16 (L) 22 - 32 mmol/L   Glucose, Bld 221 (H) 70 - 99 mg/dL   BUN 10 6 - 20 mg/dL   Creatinine, Ser 0.55 0.44 - 1.00 mg/dL   Calcium 8.8 (L) 8.9 - 10.3 mg/dL   Total Protein 7.1 6.5 - 8.1 g/dL  Albumin 3.9 3.5 - 5.0 g/dL   AST 69 (H) 15 - 41 U/L   ALT 19 0 - 44 U/L   Alkaline Phosphatase 69 38 - 126 U/L   Total Bilirubin 0.4 0.3 - 1.2 mg/dL   GFR calc non Af Amer >60 >60 mL/min   GFR calc Af Amer >60 >60 mL/min   Anion gap 13 5 - 15    Comment: Performed at [facility], [address] [place], [place] [address]  Ethanol     Status: None   Collection Time: 09/09/18  6:02 PM  Result Value Ref Range   Alcohol, Ethyl (B) <10 <10 mg/dL    Comment: (NOTE) Lowest detectable limit for serum alcohol is 10 mg/dL. For medical purposes only. Performed at [facility], [address] [place], [place] [address]   Salicylate level     Status: None   Collection Time: 09/09/18  6:02 PM  Result Value Ref Range   Salicylate Lvl <7.0 2.8 - 30.0 mg/dL    Comment: Performed at [facility], [address] [place], [place] [address]  Acetaminophen level     Status: Abnormal   Collection Time: 09/09/18  6:02 PM  Result Value Ref Range   Acetaminophen (Tylenol), Serum <10 (L) 10 - 30 ug/mL    Comment: (NOTE) Therapeutic concentrations vary significantly. A range of 10-30 ug/mL  may be an effective  concentration for many patients. However, some  are best treated at concentrations outside of this range. Acetaminophen concentrations >150 ug/mL at 4 hours after ingestion  and >50 ug/mL at 12 hours after ingestion are often associated with  toxic reactions. Performed at [facility], [address] [place], [place] [address]   cbc     Status: Abnormal   Collection Time: 09/09/18  6:02 PM  Result Value Ref Range   WBC 12.0 (H) 4.0 - 10.5 K/uL   RBC 4.32 3.87 - 5.11 MIL/uL   Hemoglobin 13.3 12.0 - 15.0 g/dL   HCT 13.240.4 44.036.0 - 10.246.0 %   MCV 93.5 80.0 - 100.0 fL   MCH 30.8 26.0 - 34.0 pg   MCHC 32.9 30.0 - 36.0 g/dL   RDW 72.512.9 36.611.5 - 44.015.5 %   Platelets 254 150 - 400 K/uL   nRBC 0.0 0.0 - 0.2 %    Comment: Performed at [facility], [address] [place], [place] [address]  Urine Drug Screen, Qualitative     Status: Abnormal   Collection Time: 09/09/18  6:02 PM  Result Value Ref Range   Tricyclic, Ur Screen NONE DETECTED NONE DETECTED   Amphetamines, Ur Screen POSITIVE (A) NONE DETECTED   MDMA (Ecstasy)Ur Screen NONE DETECTED NONE DETECTED   Cocaine Metabolite,Ur [place] POSITIVE (A) NONE DETECTED   Opiate, Ur Screen NONE DETECTED NONE DETECTED   Phencyclidine (PCP) Ur S NONE DETECTED NONE DETECTED   Cannabinoid 50 Ng, Ur [place] POSITIVE (A) NONE DETECTED   Barbiturates, Ur Screen NONE DETECTED NONE DETECTED   Benzodiazepine, Ur Scrn POSITIVE (A) NONE DETECTED   Methadone Scn, Ur NONE DETECTED NONE DETECTED    Comment: (NOTE) Tricyclics + metabolites, urine    Cutoff 1000 ng/mL Amphetamines + metabolites, urine  Cutoff 1000 ng/mL MDMA (Ecstasy), urine              Cutoff 500 ng/mL Cocaine Metabolite, urine          Cutoff 300 ng/mL Opiate + metabolites, urine  Cutoff 300 ng/mL Phencyclidine (PCP), urine         Cutoff 25 ng/mL Cannabinoid, urine                 Cutoff 50 ng/mL Barbiturates + metabolites, urine  Cutoff 200 ng/mL Benzodiazepine, urine               Cutoff 200 ng/mL Methadone, urine                   Cutoff 300 ng/mL The urine drug screen provides only a preliminary, unconfirmed analytical test result and should not be used for non-medical purposes. Clinical consideration and professional judgment should be applied to any positive drug screen result due to possible interfering substances. A more specific alternate chemical method must be used in order to obtain a confirmed analytical result. Gas chromatography / mass spectrometry (GC/MS) is the preferred confirmat ory method. Performed at [facility], [address] [place], [place] [address]   Pregnancy, urine POC     Status: None   Collection Time: 09/09/18  6:12 PM  Result Value Ref Range   Preg Test, Ur NEGATIVE NEGATIVE    Comment:        THE SENSITIVITY OF THIS METHODOLOGY IS >24 mIU/mL   SARS Coronavirus 2 (CEPHEID - Performed in [facility] hospital lab), Hosp Order     Status: None   Collection Time: 09/10/18 12:28 AM   Specimen: Nasopharyngeal Swab  Result Value Ref Range   SARS Coronavirus 2 NEGATIVE NEGATIVE    Comment: (NOTE) If result is NEGATIVE SARS-CoV-2 target nucleic acids are NOT DETECTED. The SARS-CoV-2 RNA is generally detectable in upper and lower  respiratory specimens during the acute phase of infection. The lowest  concentration of SARS-CoV-2 viral copies this assay can detect is 250  copies / mL. A negative result does not preclude SARS-CoV-2 infection  and should not be used as the sole basis for treatment or other  patient management decisions.  A negative result may occur with  improper specimen collection / handling, submission of specimen other  than nasopharyngeal swab, presence of viral mutation(s) within the  areas targeted by this assay, and inadequate number of viral copies  (<250 copies / mL). A negative result must be combined with clinical  observations, patient history, and epidemiological information. If result is  POSITIVE SARS-CoV-2 target nucleic acids are DETECTED. The SARS-CoV-2 RNA is generally detectable in upper and lower  respiratory specimens dur ing the acute phase of infection.  Positive  results are indicative of active infection with SARS-CoV-2.  Clinical  correlation with patient history and other diagnostic information is  necessary to determine patient infection status.  Positive results do  not rule out bacterial infection or co-infection with other viruses. If result is PRESUMPTIVE POSTIVE SARS-CoV-2 nucleic acids MAY BE PRESENT.   A presumptive positive result was obtained on the submitted specimen  and confirmed on repeat testing.  While 2019 novel coronavirus  (SARS-CoV-2) nucleic acids may be present in the submitted sample  additional confirmatory testing may be necessary for epidemiological  and / or clinical management purposes  to differentiate between  SARS-CoV-2 and other Sarbecovirus currently known to infect humans.  If clinically indicated additional testing with an alternate test  methodology [phone]) is advised. The SARS-CoV-2 RNA is generally  detectable in upper and lower respiratory sp ecimens during the acute  phase of infection. The expected result is Negative. Fact Sheet for Patients:  Boiler[employer]com.cy Fact  Sheet for Healthcare Providers: https://pope.com/https://www.fda.gov/media/136313/download This test is not yet approved or cleared by the [place] FDA and has been authorized for detection and/or diagnosis of SARS-CoV-2 by FDA under an Emergency Use Authorization (EUA).  This EUA will remain in effect (meaning this test can be used) for the duration of the COVID-19 declaration under Section 564(b)(1) of the Act, 21 U.S.C. section 360bbb-3(b)(1), unless the authorization is terminated or revoked sooner. Performed at [facility], [address] [place], [place] [address]     Blood Alcohol level:  Lab Results  Component  Value Date   ETH <10 09/09/2018   ETH 162 (H) 06/03/2016    Metabolic Disorder Labs:  Lab Results  Component Value Date   HGBA1C 5.2 09/27/2012   MPG 103 09/27/2012   No results found for: PROLACTIN No results found for: CHOL, TRIG, HDL, CHOLHDL, VLDL, LDLCALC  Current Medications: Current Facility-Administered Medications  Medication Dose Route Frequency Provider Last Rate Last Dose  . albuterol (VENTOLIN HFA) 108 (90 Base) MCG/ACT inhaler 2 puff  2 puff Inhalation Q6H PRN Catalina Gravelhomspon, Jacqueline, NP      . alum & mag hydroxide-simeth (MAALOX/MYLANTA) 200-200-20 MG/5ML suspension 30 mL  30 mL Oral Q4H PRN Thomspon, Adela LankJacqueline, NP      . atorvastatin (LIPITOR) tablet 40 mg  40 mg Oral Daily Catalina Gravelhomspon, Jacqueline, NP   40 mg at 09/10/18 0820  . calcium-vitamin D (OSCAL WITH D) 500-200 MG-UNIT per tablet 1 tablet  1 tablet Oral Q breakfast Catalina Gravelhomspon, Jacqueline, NP   1 tablet at 09/10/18 [phone]  . cariprazine (VRAYLAR) capsule 3 mg  3 mg Oral Daily Catalina Gravelhomspon, Jacqueline, NP   3 mg at 09/10/18 0820  . clonazePAM (KLONOPIN) tablet 1 mg  1 mg Oral TID PRN Jamira Barfuss, Jackquline DenmarkJohn T, MD      . diphenoxylate-atropine (LOMOTIL) 2.5-0.025 MG per tablet 2 tablet  2 tablet Oral QID PRN Catalina Gravelhomspon, Jacqueline, NP      . FLUoxetine (PROZAC) capsule 80 mg  80 mg Oral Daily Catalina Gravelhomspon, Jacqueline, NP   80 mg at 09/10/18 [id number]  . fluticasone (FLONASE) 50 MCG/ACT nasal spray 1 spray  1 spray Each Nare Daily Catalina Gravelhomspon, Jacqueline, NP   1 spray at 09/10/18 [id number]  . hydrOXYzine (ATARAX/VISTARIL) tablet 25 mg  25 mg Oral Q6H PRN Catalina Gravelhomspon, Jacqueline, NP      . magnesium hydroxide (MILK OF MAGNESIA) suspension 30 mL  30 mL Oral Daily PRN Catalina Gravelhomspon, Jacqueline, NP      . metFORMIN (GLUCOPHAGE) tablet 500 mg  500 mg Oral BID WC Catalina Gravelhomspon, Jacqueline, NP   500 mg at 09/10/18 1711  . multivitamin with minerals tablet 1 tablet  1 tablet Oral Daily Catalina Gravelhomspon, Jacqueline, NP   1 tablet at 09/10/18 0820  . pantoprazole (PROTONIX) EC tablet 40  mg  40 mg Oral Daily Catalina Gravelhomspon, Jacqueline, NP   40 mg at 09/10/18 0820  . pregabalin (LYRICA) capsule 50 mg  50 mg Oral TID Catalina Gravelhomspon, Jacqueline, NP   50 mg at 09/10/18 1711  . topiramate (TOPAMAX) tablet 200 mg  200 mg Oral QHS Thomspon, Jacqueline, NP      . zolpidem (AMBIEN) tablet 5 mg  5 mg Oral QHS Laurabeth Yip T, MD       PTA Medications: Medications Prior to Admission  Medication Sig Dispense Refill Last Dose  . albuterol (PROVENTIL HFA;VENTOLIN HFA) 108 (90 BASE) MCG/ACT inhaler Inhale 2 puffs into the lungs every 6 (six) hours as needed for wheezing.     .Marland Kitchen  ARIPiprazole (ABILIFY) 20 MG tablet Take 20 mg by mouth daily.     Marland Kitchen atorvastatin (LIPITOR) 40 MG tablet Take 40 mg by mouth daily.     . calcium-vitamin D (OSCAL WITH D) 500-200 MG-UNIT tablet Take 1 tablet by mouth.     . cariprazine (VRAYLAR) capsule Take 3 mg by mouth daily.     . clonazePAM (KLONOPIN) 1 MG tablet Take 1 mg by mouth 3 (three) times daily as needed for anxiety.     . diphenoxylate-atropine (LOMOTIL) 2.5-0.025 MG tablet Take by mouth 4 (four) times daily as needed for diarrhea or loose stools.     Marland Kitchen esomeprazole (NEXIUM) 40 MG capsule Take 40 mg by mouth daily at 12 noon.     Marland Kitchen FLUoxetine (PROZAC) 40 MG capsule Take 80 mg by mouth daily.     . fluticasone (FLONASE) 50 MCG/ACT nasal spray Place into both nostrils daily.     Marland Kitchen LYRICA 50 MG capsule Take 50 mg by mouth 3 (three) times daily.     . metFORMIN (GLUCOPHAGE) 500 MG tablet Take 500 mg by mouth 2 (two) times daily with a meal.     . methocarbamol (ROBAXIN) 500 MG tablet Take 500 mg by mouth 2 (two) times daily as needed.      . Multiple Vitamin (MULTIVITAMIN) capsule Take 1 capsule by mouth daily.     . Norgestimate-Ethinyl Estradiol Triphasic (TRI-SPRINTEC) 0.18/0.215/0.25 MG-35 MCG tablet Take 1 tablet by mouth daily.     Marland Kitchen PHENTERMINE HCL PO Take by mouth.     . topiramate (TOPAMAX) 200 MG tablet Take 200 mg by mouth at bedtime.     Marland Kitchen zolpidem  (AMBIEN) 10 MG tablet Take 10 mg by mouth at bedtime as needed for sleep.       Musculoskeletal: Strength & Muscle Tone: within normal limits Gait & Station: normal Patient leans: N/A  Psychiatric Specialty Exam: Physical Exam  Nursing note and vitals reviewed. Constitutional: She appears well-developed and well-nourished.  HENT:  Head: Normocephalic and atraumatic.  Eyes: Pupils are equal, round, and reactive to light. Conjunctivae are normal.  Neck: Normal range of motion.  Cardiovascular: Regular rhythm and normal heart sounds.  Respiratory: Effort normal. No respiratory distress.  GI: Soft.  Musculoskeletal: Normal range of motion.  Neurological: She is alert.  Skin: Skin is warm and dry.  Psychiatric: Her speech is normal and behavior is normal. Her affect is blunt. She expresses impulsivity. She exhibits a depressed mood. She expresses suicidal ideation. She expresses no suicidal plans. She exhibits abnormal recent memory.    Review of Systems  Constitutional: Negative.   HENT: Negative.   Eyes: Negative.   Respiratory: Negative.   Cardiovascular: Negative.   Gastrointestinal: Negative.   Musculoskeletal: Negative.   Skin: Negative.   Neurological: Negative.   Psychiatric/Behavioral: Positive for depression, hallucinations, substance abuse and suicidal ideas.    Blood pressure (!) 129/92, pulse (!) 101, temperature (!) 97.5 F (36.4 C), temperature source Oral, resp. rate 18, height  (1.702 m), weight 92.1 kg, SpO2 96 %.Body mass index is 31.79 kg/m.  General Appearance: Casual  Eye Contact:  Fair  Speech:  Clear and Coherent  Volume:  Normal  Mood:  Anxious, Depressed and Dysphoric  Affect:  Labile  Thought Process:  Coherent  Orientation:  Full (Time, Place, and Person)  Thought Content:  Logical  Suicidal Thoughts:  Yes.  without intent/plan  Homicidal Thoughts:  No  Memory:  Immediate;   Fair Recent;  Fair Remote;   Fair  Judgement:  Impaired   Insight:  Shallow  Psychomotor Activity:  Decreased  Concentration:  Concentration: Poor  Recall:  Fis[employer]Fair  Fund of Knowledge:  Fair  Language:  Fair  Akathisia:  No  Handed:  Right  AIMS (if indicated):     Assets:  Desire for Improvement Housing  ADL's:  Intact  Cognition:  WNL  Sleep:  Number of Hours: 2    Treatment Plan Summary: Daily contact with patient to assess and evaluate symptoms and progress in treatment, Medication management and Plan Continue current medication as prescribed by outpatient psychiatrist.  Involve patient in groups and activities.  Monitor for substance abuse withdrawal.  Reassess in an ongoing way her mood symptoms and dangerousness.  Work on assuring we have appropriate discharge planning in place  Observation Level/Precautions:  15 minute checks  Laboratory:  Chemistry Profile  Psychotherapy:    Medications:    Consultations:    Discharge Concerns:    Estimated LOS:  Other:     Physician Treatment Plan for Primary Diagnosis: Bipolar 1 disorder, manic, moderate ([name]) Long Term Goal(s): Improvement in symptoms so as ready for discharge  Short Term Goals: Ability to verbalize feelings will improve, Ability to disclose and discuss suicidal ideas and Ability to demonstrate self-control will improve  Physician Treatment Plan for Secondary Diagnosis: Principal Problem:   Bipolar 1 disorder, manic, moderate ([name]) Active Problems:   Alcohol withdrawal ([name])   Alcohol abuse   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[name])   Amphetamine abuse ([name])   Diabetes ([name])  Long Term Goal(s): Improvement in symptoms so as ready for discharge  Short Term Goals: Compliance with prescribed medications will improve and Ability to identify triggers associated with substance abuse/mental health issues will improve  I certify that inpatient services furnished can reasonably be expected to improve the patient's condition.    Mordecai RasmussenJohn Avondre Richens, MD 6/24/20205:57  PM

## 2018-09-11 MED ORDER — NAPROXEN 250 MG PO TABS
250.0000 mg | ORAL_TABLET | Freq: Two times a day (BID) | ORAL | Status: DC | PRN
  Administered 2018-09-13: 250 mg via ORAL
  Filled 2018-09-11 (×5): qty 1

## 2018-09-11 MED ORDER — NORGESTIM-ETH ESTRAD TRIPHASIC 0.18/0.215/0.25 MG-25 MCG PO TABS
1.0000 | ORAL_TABLET | Freq: Every day | ORAL | Status: DC
  Administered 2018-09-11: 2 via ORAL
  Administered 2018-09-12 – 2018-09-15 (×4): 1 via ORAL
  Filled 2018-09-11 (×6): qty 1

## 2018-09-11 MED ORDER — NICOTINE 21 MG/24HR TD PT24
21.0000 mg | MEDICATED_PATCH | Freq: Every day | TRANSDERMAL | Status: DC
  Administered 2018-09-11 – 2018-09-15 (×5): 21 mg via TRANSDERMAL
  Filled 2018-09-11 (×5): qty 1

## 2018-09-11 NOTE — Progress Notes (Signed)
[facility] MD Progress Note  09/11/2018 4:01 PM [name]  MRN:  [id number] Subjective: Follow-up patient with bipolar disorder and substance abuse problems.  Patient on interview today is calmer and less agitated than yesterday.  Says that she is not feeling like her thoughts are racing.  Not having hallucinations.  No longer having suicidal thoughts.  Still feeling anxious and a little confused.  Sleep schedule is still disrupted.  She does have a request to restart her oral birth control pills today and get a nicotine patch.  Called the office of her regular outpatient psychiatrist.  He is out of the office until early next week but I left a message with his nursing provider. Principal Problem: Bipolar 1 disorder, manic, moderate ([name]) Diagnosis: Principal Problem:   Bipolar 1 disorder, manic, moderate ([name]) Active Problems:   Alcohol withdrawal ([name])   Alcohol abuse   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[name])   Amphetamine abuse ([name])   Diabetes ([name])  Total Time spent with patient: 30 minutes  Past Psychiatric History: Past history of bipolar disorder several prior hospitalizations substance abuse.  Good compliance with substance abuse treatment most of the time but had a recent relapse  Past Medical History:  Past Medical History:  Diagnosis Date  . Alcohol abuse   . Anxiety   . Anxiety   . Anxiety and depression   . Asthma   . Bipolar disorder ([name])   . Borderline personality disorder ([name])   . Depression   . Diabetes ([name])    Type 2   . Dyslipidemia   . Esophageal varices ([name])   . Foot drop    right  . 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[name])   . GERD (gastroesophageal reflux disease)   . Gout   . History of drug abuse ([name])   . Insomnia   . Muscular dystrophies ([name])   . Neuropathy   . Pancreatitis   . 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 HERNIA REPAIR     Family History:  Family History   Problem Relation Age of Onset  . Heart disease Mother   . Heart attack Mother   . Hyperlipidemia Maternal Grandmother   . Diabetes Maternal Grandmother   . Hypertension Maternal Grandmother   . Hyperlipidemia Maternal Grandfather   . Heart disease Maternal Grandfather   . Heart attack Maternal Grandfather   . Diabetes Maternal Grandfather   . Lung cancer Maternal Grandfather   . Hypertension Maternal Grandfather   . Other Paternal Grandmother        Thyroid problems   . Heart disease Paternal Grandfather   . Stroke Paternal Grandfather   . Hyperlipidemia Paternal Grandfather   . Heart attack Paternal Grandfather   . Hypertension Paternal Grandfather    Family Psychiatric  History: See previous Social History:  Social History   Substance and Sexual Activity  Alcohol Use Not Currently   Comment: in rehab     Social History   Substance and Sexual Activity  Drug Use No   Comment: in rehab    Social History   Socioeconomic History  . Marital status: Legally Separated    Spouse name: Not on file  . Number of children: Not on file  . Years of education: Not on file  . Highest education level: Not on file  Occupational History  . Occupation: disabled  Social Needs  . Financial resource strain: Not on file  . Food insecurity  Worry: Not on file    Inability: Not on file  . Transportation needs    Medical: Not on file    Non-medical: Not on file  Tobacco Use  . Smoking status: Current Every Day Smoker    Packs/day: 0.50  . Smokeless tobacco: Never Used  Substance and Sexual Activity  . Alcohol use: Not Currently    Comment: in rehab  . Drug use: No    Comment: in rehab  . Sexual activity: Yes    Birth control/protection: None  Lifestyle  . Physical activity    Days per week: Not on file    Minutes per session: Not on file  . Stress: Not on file  Relationships  . Social Herbalist on phone: Not on file    Gets together: Not on file    Attends  religious service: Not on file    Active member of club or organization: Not on file    Attends meetings of clubs or organizations: Not on file    Relationship status: Not on file  Other Topics Concern  . Not on file  Social History Narrative  . Not on file   Additional Social History:                         Sleep: Fair  Appetite:  Fair  Current Medications: Current Facility-Administered Medications  Medication Dose Route Frequency Provider Last Rate Last Dose  . albuterol (VENTOLIN HFA) 108 (90 Base) MCG/ACT inhaler 2 puff  2 puff Inhalation Q6H PRN Lamont Dowdy, NP      . alum & mag hydroxide-simeth (MAALOX/MYLANTA) 200-200-20 MG/5ML suspension 30 mL  30 mL Oral Q4H PRN Thomspon, Geni Bers, NP      . atorvastatin (LIPITOR) tablet 40 mg  40 mg Oral Daily Lamont Dowdy, NP   40 mg at 09/11/18 0801  . calcium-vitamin D (OSCAL WITH D) 500-200 MG-UNIT per tablet 1 tablet  1 tablet Oral Q breakfast Lamont Dowdy, NP   1 tablet at 09/11/18 0803  . cariprazine (VRAYLAR) capsule 3 mg  3 mg Oral Daily Lamont Dowdy, NP   3 mg at 09/11/18 0803  . clonazePAM (KLONOPIN) tablet 1 mg  1 mg Oral TID PRN Katriona Schmierer, Madie Reno, MD      . diphenoxylate-atropine (LOMOTIL) 2.5-0.025 MG per tablet 2 tablet  2 tablet Oral QID PRN Lamont Dowdy, NP      . FLUoxetine (PROZAC) capsule 80 mg  80 mg Oral Daily Lamont Dowdy, NP   80 mg at 09/11/18 0802  . fluticasone (FLONASE) 50 MCG/ACT nasal spray 1 spray  1 spray Each Nare Daily Lamont Dowdy, NP   1 spray at 09/11/18 0802  . hydrOXYzine (ATARAX/VISTARIL) tablet 25 mg  25 mg Oral Q6H PRN Lamont Dowdy, NP      . magnesium hydroxide (MILK OF MAGNESIA) suspension 30 mL  30 mL Oral Daily PRN Lamont Dowdy, NP      . metFORMIN (GLUCOPHAGE) tablet 500 mg  500 mg Oral BID WC Lamont Dowdy, NP   500 mg at 09/11/18 0802  . multivitamin with minerals tablet 1 tablet  1 tablet Oral Daily  Lamont Dowdy, NP   1 tablet at 09/11/18 0802  . nicotine (NICODERM CQ - dosed in mg/24 hours) patch 21 mg  21 mg Transdermal Daily Nakhia Levitan T, MD      . Norgestimate-Ethinyl Estradiol Triphasic 0.18/0.215/0.25 MG-25 MCG tablet 1 tablet  1 tablet Oral  Daily Vashawn Ekstein, Jackquline Denmark, MD   2 tablet at 09/11/18 1520  . pantoprazole (PROTONIX) EC tablet 40 mg  40 mg Oral Daily Catalina Gravel, NP   40 mg at 09/11/18 0802  . pregabalin (LYRICA) capsule 50 mg  50 mg Oral TID Catalina Gravel, NP   50 mg at 09/11/18 1225  . topiramate (TOPAMAX) tablet 200 mg  200 mg Oral QHS Catalina Gravel, NP   200 mg at 09/10/18 2124  . zolpidem (AMBIEN) tablet 5 mg  5 mg Oral QHS Elianah Karis, Jackquline Denmark, MD   5 mg at 09/10/18 2124    Lab Results:  Results for orders placed or performed during the hospital encounter of 09/09/18 (from the past 48 hour(s))  Comprehensive metabolic panel     Status: Abnormal   Collection Time: 09/09/18  6:02 PM  Result Value Ref Range   Sodium 133 (L) 135 - 145 mmol/L   Potassium 3.9 3.5 - 5.1 mmol/L   Chloride 104 98 - 111 mmol/L   CO2 16 (L) 22 - 32 mmol/L   Glucose, Bld 221 (H) 70 - 99 mg/dL   BUN 10 6 - 20 mg/dL   Creatinine, Ser 1.61 0.44 - 1.00 mg/dL   Calcium 8.8 (L) 8.9 - 10.3 mg/dL   Total Protein 7.1 6.5 - 8.1 g/dL   Albumin 3.9 3.5 - 5.0 g/dL   AST 69 (H) 15 - 41 U/L   ALT 19 0 - 44 U/L   Alkaline Phosphatase 69 38 - 126 U/L   Total Bilirubin 0.4 0.3 - 1.2 mg/dL   GFR calc non Af Amer >60 >60 mL/min   GFR calc Af Amer >60 >60 mL/min   Anion gap 13 5 - 15    Comment: Performed at [facility], [address], [place], [place] [address]  Ethanol     Status: None   Collection Time: 09/09/18  6:02 PM  Result Value Ref Range   Alcohol, Ethyl (B) <10 <10 mg/dL    Comment: (NOTE) Lowest detectable limit for serum alcohol is 10 mg/dL. For medical purposes only. Performed at [facility], [address] [place], [place] [address]    Salicylate level     Status: None   Collection Time: 09/09/18  6:02 PM  Result Value Ref Range   Salicylate Lvl <7.0 2.8 - 30.0 mg/dL    Comment: Performed at [facility], [address] [place], [place] [address]  Acetaminophen level     Status: Abnormal   Collection Time: 09/09/18  6:02 PM  Result Value Ref Range   Acetaminophen (Tylenol), Serum <10 (L) 10 - 30 ug/mL    Comment: (NOTE) Therapeutic concentrations vary significantly. A range of 10-30 ug/mL  may be an effective concentration for many patients. However, some  are best treated at concentrations outside of this range. Acetaminophen concentrations >150 ug/mL at 4 hours after ingestion  and >50 ug/mL at 12 hours after ingestion are often associated with  toxic reactions. Performed at [facility], [address] [place], [place] [address]   cbc     Status: Abnormal   Collection Time: 09/09/18  6:02 PM  Result Value Ref Range   WBC 12.0 (H) 4.0 - 10.5 K/uL   RBC 4.32 3.87 - 5.11 MIL/uL   Hemoglobin 13.3 12.0 - 15.0 g/dL   HCT 62.1 30.8 - 65.7 %   MCV 93.5 80.0 - 100.0 fL   MCH 30.8 26.0 - 34.0 pg   MCHC  32.9 30.0 - 36.0 g/dL   RDW 81.112.9 91.411.5 - 78.215.5 %   Platelets 254 150 - 400 K/uL   nRBC 0.0 0.0 - 0.2 %    Comment: Performed at [facility], [address] [place], [place] [address]  Urine Drug Screen, Qualitative     Status: Abnormal   Collection Time: 09/09/18  6:02 PM  Result Value Ref Range   Tricyclic, Ur Screen NONE DETECTED NONE DETECTED   Amphetamines, Ur Screen POSITIVE (A) NONE DETECTED   MDMA (Ecstasy)Ur Screen NONE DETECTED NONE DETECTED   Cocaine Metabolite,Ur Radium POSITIVE (A) NONE DETECTED   Opiate, Ur Screen NONE DETECTED NONE DETECTED   Phencyclidine (PCP) Ur S NONE DETECTED NONE DETECTED   Cannabinoid 50 Ng, Ur [place] POSITIVE (A) NONE DETECTED   Barbiturates, Ur Screen NONE DETECTED NONE DETECTED   Benzodiazepine, Ur Scrn POSITIVE (A) NONE DETECTED   Methadone Scn,  Ur NONE DETECTED NONE DETECTED    Comment: (NOTE) Tricyclics + metabolites, urine    Cutoff 1000 ng/mL Amphetamines + metabolites, urine  Cutoff 1000 ng/mL MDMA (Ecstasy), urine              Cutoff 500 ng/mL Cocaine Metabolite, urine          Cutoff 300 ng/mL Opiate + metabolites, urine        Cutoff 300 ng/mL Phencyclidine (PCP), urine         Cutoff 25 ng/mL Cannabinoid, urine                 Cutoff 50 ng/mL Barbiturates + metabolites, urine  Cutoff 200 ng/mL Benzodiazepine, urine              Cutoff 200 ng/mL Methadone, urine                   Cutoff 300 ng/mL The urine drug screen provides only a preliminary, unconfirmed analytical test result and should not be used for non-medical purposes. Clinical consideration and professional judgment should be applied to any positive drug screen result due to possible interfering substances. A more specific alternate chemical method must be used in order to obtain a confirmed analytical result. Gas chromatography / mass spectrometry (GC/MS) is the preferred confirmat ory method. Performed at [facility], [address] [place], [place] [address]   Pregnancy, urine POC     Status: None   Collection Time: 09/09/18  6:12 PM  Result Value Ref Range   Preg Test, Ur NEGATIVE NEGATIVE    Comment:        THE SENSITIVITY OF THIS METHODOLOGY IS >24 mIU/mL   SARS Coronavirus 2 (CEPHEID - Performed in [facility] hospital lab), Hosp Order     Status: None   Collection Time: 09/10/18 12:28 AM   Specimen: Nasopharyngeal Swab  Result Value Ref Range   SARS Coronavirus 2 NEGATIVE NEGATIVE    Comment: (NOTE) If result is NEGATIVE SARS-CoV-2 target nucleic acids are NOT DETECTED. The SARS-CoV-2 RNA is generally detectable in upper and lower  respiratory specimens during the acute phase of infection. The lowest  concentration of SARS-CoV-2 viral copies this assay can detect is 250  copies / mL. A negative result does not preclude  SARS-CoV-2 infection  and should not be used as the sole basis for treatment or other  patient management decisions.  A negative result may occur with  improper specimen collection / handling, submission of specimen other  than nasopharyngeal swab, presence of viral mutation(s) within the  areas targeted by this assay, and inadequate number of viral copies  (<250 copies / mL). A negative result must be combined with clinical  observations, patient history, and epidemiological information. If result is POSITIVE SARS-CoV-2 target nucleic acids are DETECTED. The SARS-CoV-2 RNA is generally detectable in upper and lower  respiratory specimens dur ing the acute phase of infection.  Positive  results are indicative of active infection with SARS-CoV-2.  Clinical  correlation with patient history and other diagnostic information is  necessary to determine patient infection status.  Positive results do  not rule out bacterial infection or co-infection with other viruses. If result is PRESUMPTIVE POSTIVE SARS-CoV-2 nucleic acids MAY BE PRESENT.   A presumptive positive result was obtained on the submitted specimen  and confirmed on repeat testing.  While 2019 novel coronavirus  (SARS-CoV-2) nucleic acids may be present in the submitted sample  additional confirmatory testing may be necessary for epidemiological  and / or clinical management purposes  to differentiate between  SARS-CoV-2 and other Sarbecovirus currently known to infect humans.  If clinically indicated additional testing with an alternate test  methodology [phone]([id number]) is advised. The SARS-CoV-2 RNA is generally  detectable in upper and lower respiratory sp ecimens during the acute  phase of infection. The expected result is Negative. Fact Sheet for Patients:  BoilerBrush.com.cyhttps://www.fda.gov/media/136312/download Fact Sheet for Healthcare Providers: https://pope.com/https://www.fda.gov/media/136313/download This test is not yet approved or cleared by the  [place] FDA and has been authorized for detection and/or diagnosis of SARS-CoV-2 by FDA under an Emergency Use Authorization (EUA).  This EUA will remain in effect (meaning this test can be used) for the duration of the COVID-19 declaration under Section 564(b)(1) of the Act, 21 U.S.C. section 360bbb-3(b)(1), unless the authorization is terminated or revoked sooner. Performed at [facility], [address] [place], [place] [address]     Blood Alcohol level:  Lab Results  Component Value Date   ETH <10 09/09/2018   ETH 162 (H) 06/03/2016    Metabolic Disorder Labs: Lab Results  Component Value Date   HGBA1C 5.2 09/27/2012   MPG 103 09/27/2012   No results found for: PROLACTIN No results found for: CHOL, TRIG, HDL, CHOLHDL, VLDL, LDLCALC  Physical Findings: AIMS: Facial and Oral Movements Muscles of Facial Expression: None, normal Lips and Perioral Area: None, normal Jaw: None, normal Tongue: None, normal,Extremity Movements Upper (arms, wrists, hands, fingers): None, normal Lower (legs, knees, ankles, toes): None, normal, Trunk Movements Neck, shoulders, hips: None, normal, Overall Severity Severity of abnormal movements (highest score from questions above): None, normal Incapacitation due to abnormal movements: None, normal Patient's awareness of abnormal movements (rate only patient's report): No Awareness, Dental Status Current problems with teeth and/or dentures?: No Does patient usually wear dentures?: No  CIWA:  CIWA-Ar Total: 2 COWS:  COWS Total Score: 3  Musculoskeletal: Strength & Muscle Tone: within normal limits Gait & Station: normal Patient leans: N/A  Psychiatric Specialty Exam: Physical Exam  Nursing note and vitals reviewed. Constitutional: She appears well-developed and well-nourished.  HENT:  Head: Normocephalic and atraumatic.  Eyes: Pupils are equal, round, and reactive to light. Conjunctivae are normal.  Neck: Normal range  of motion.  Cardiovascular: Regular rhythm and normal heart sounds.  Respiratory: Effort normal. No respiratory distress.  GI: Soft.  Musculoskeletal: Normal range of motion.  Neurological: She is alert.  Skin: Skin is warm and dry.  Psychiatric: Her mood appears anxious. Her speech is delayed. She is slowed. Thought content is paranoid.  Cognition and memory are impaired. She expresses impulsivity. She expresses no homicidal and no suicidal ideation.    Review of Systems  Constitutional: Negative.   HENT: Negative.   Eyes: Negative.   Respiratory: Negative.   Cardiovascular: Negative.   Gastrointestinal: Negative.   Musculoskeletal: Negative.   Skin: Negative.   Neurological: Negative.   Psychiatric/Behavioral: Positive for depression, substance abuse and suicidal ideas.    Blood pressure (!) 126/98, pulse 99, temperature 97.8 F (36.6 C), temperature source Oral, resp. rate 16, height 5\' 7"  (1.702 m), weight 92.1 kg, SpO2 96 %.Body mass index is 31.79 kg/m.  General Appearance: Casual  Eye Contact:  Good  Speech:  Clear and Coherent  Volume:  Normal  Mood:  Depressed  Affect:  Constricted  Thought Process:  Coherent  Orientation:  Full (Time, Place, and Person)  Thought Content:  Logical  Suicidal Thoughts:  Yes.  without intent/plan  Homicidal Thoughts:  No  Memory:  Immediate;   Fair Recent;   Fair Remote;   Fair  Judgement:  Fair  Insight:  Fair  Psychomotor Activity:  Decreased  Concentration:  Concentration: Fair  Recall:  FiservFair  Fund of Knowledge:  Fair  Language:  Fair  Akathisia:  No  Handed:  Right  AIMS (if indicated):     Assets:  Desire for Improvement  ADL's:  Impaired  Cognition:  WNL  Sleep:  Number of Hours: 7.25     Treatment Plan Summary: Daily contact with patient to assess and evaluate symptoms and progress in treatment, Medication management and Plan No change to psychiatric medicine.  Left message for her primary psychiatrist.  She appears  to be stabilizing just from detoxing well.  Engage in individual and group therapy.  Encourage patient to work with treatment team on an appropriate outpatient plan likely length of stay 2 or 3 days  Mordecai RasmussenJohn Ansleigh Safer, MD 09/11/2018, 4:01 PM

## 2018-09-11 NOTE — Progress Notes (Signed)
D: Patient stated slept fair last night .Stated appetitegood and energy level   normal. Stated concentration is good . Stated on Depression scale 0 , hopeless 0 and anxiety 0 .( low 0-10 high) Denies suicidal  homicidal ideations  .  No auditory hallucinations  No pain concerns . Appropriate ADL'S. Interacting with peers and staff. Voice of no safety  concerns . Interacting  With peers on unit . A: Encourage patient participation with unit programming . Instruction  Given on  Medication , verbalize understanding. R: Voice no other concerns. Staff continue to monitor

## 2018-09-11 NOTE — B[facility] Group Notes (Signed)
LCSW Group Therapy Note  09/11/2018 1:00 PM  Type of Therapy/Topic:  Group Therapy:  Balance in Life  Participation Level:  None  Description of Group:    This group will address the concept of balance and how it feels and looks when one is unbalanced. Patients will be encouraged to process areas in their lives that are out of balance and identify reasons for remaining unbalanced. Facilitators will guide patients in utilizing problem-solving interventions to address and correct the stressor making their life unbalanced. Understanding and applying boundaries will be explored and addressed for obtaining and maintaining a balanced life. Patients will be encouraged to explore ways to assertively make their unbalanced needs known to significant others in their lives, using other group members and facilitator for support and feedback.  Therapeutic Goals: 1. Patient will identify two or more emotions or situations they have that consume much of in their lives. 2. Patient will identify signs/triggers that life has become out of balance:  3. Patient will identify two ways to set boundaries in order to achieve balance in their lives:  4. Patient will demonstrate ability to communicate their needs through discussion and/or role plays  Summary of Patient Progress: Patient was present for group, but did not engage in discussion.  Patient appeared to sleep throughout group.    Therapeutic Modalities:   Cognitive Behavioral Therapy Solution-Focused Therapy Assertiveness Training  Assunta Curtis MSW, [place] 09/11/2018 12:04 PM

## 2018-09-11 NOTE — Progress Notes (Signed)
Recreation Therapy Notes    Date: 09/11/2018  Time: 9:30 am  Location: Craft room  Behavioral response: Appropriate  Intervention Topic: Coping skills  Discussion/Intervention:  Group content on today was focused on coping skills. The group defined what coping skills are and when they can be used. Individuals described how they normally cope with thing and the coping skills they normally use. Patients expressed why it is important to cope with things and how not coping with things can affect you. The group participated in the intervention "My coping box" and made coping boxes while adding coping skills they could use in the future to the box. Clinical Observations/Feedback:  Patient came to group and explained that coping skills help her make better decisions. Individual was social with peers and staff while participating in group.  Roseann Kees LRT/CTRS         Rahmir Beever 09/11/2018 11:21 AM

## 2018-09-11 NOTE — Plan of Care (Signed)
Problem: Education: Goal: Ability to make informed decisions regarding treatment will improve 09/11/2018 0352 by Lelan PonsAriwodo, Jonet Mathies, RN Outcome: Progressing 09/11/2018 0352 by Lelan PonsAriwodo, Keithen Capo, RN Outcome: Progressing   Problem: Coping: Goal: Coping ability will improve 09/11/2018 0352 by Lelan PonsAriwodo, Zacharias Ridling, RN Outcome: Progressing 09/11/2018 0352 by Lelan PonsAriwodo, Latise Dilley, RN Outcome: Progressing   Problem: Health Behavior/Discharge Planning: Goal: Identification of resources available to assist in meeting health care needs will improve 09/11/2018 0352 by Lelan PonsAriwodo, Shalamar Crays, RN Outcome: Progressing 09/11/2018 0352 by Lelan PonsAriwodo, Giuseppe Duchemin, RN Outcome: Progressing   Problem: Medication: Goal: Compliance with prescribed medication regimen will improve 09/11/2018 0352 by Lelan PonsAriwodo, Barney Russomanno, RN Outcome: Progressing 09/11/2018 0352 by Lelan PonsAriwodo, Hilari Wethington, RN Outcome: Progressing   Problem: Self-Concept: Goal: Ability to disclose and discuss suicidal ideas will improve 09/11/2018 0352 by Lelan PonsAriwodo, Ishan Sanroman, RN Outcome: Progressing 09/11/2018 0352 by Lelan PonsAriwodo, Chantale Leugers, RN Outcome: Progressing Goal: Will verbalize positive feelings about self 09/11/2018 0352 by Lelan PonsAriwodo, Cyrus Ramsburg, RN Outcome: Progressing 09/11/2018 0352 by Lelan PonsAriwodo, Austine Kelsay, RN Outcome: Progressing   Problem: Education: Goal: Utilization of techniques to improve thought processes will improve 09/11/2018 0352 by Lelan PonsAriwodo, Filip Luten, RN Outcome: Progressing 09/11/2018 0352 by Lelan PonsAriwodo, Cleavon Goldman, RN Outcome: Progressing Goal: Knowledge of the prescribed therapeutic regimen will improve 09/11/2018 0352 by Lelan PonsAriwodo, Alycia Cooperwood, RN Outcome: Progressing 09/11/2018 0352 by Lelan PonsAriwodo, Charnay Nazario, RN Outcome: Progressing   Problem: Activity: Goal: Interest or engagement in leisure activities will improve 09/11/2018 0352 by Lelan PonsAriwodo, Anel Purohit, RN Outcome: Progressing 09/11/2018 0352 by Lelan PonsAriwodo, Brionne Mertz, RN Outcome: Progressing Goal:  Imbalance in normal sleep/wake cycle will improve 09/11/2018 0352 by Lelan PonsAriwodo, Tabbitha Janvrin, RN Outcome: Progressing 09/11/2018 0352 by Lelan PonsAriwodo, Ashon Rosenberg, RN Outcome: Progressing   Problem: Coping: Goal: Coping ability will improve 09/11/2018 0352 by Lelan PonsAriwodo, Sruti Ayllon, RN Outcome: Progressing 09/11/2018 0352 by Lelan PonsAriwodo, Goldia Ligman, RN Outcome: Progressing Goal: Will verbalize feelings 09/11/2018 0352 by Lelan PonsAriwodo, Tytianna Greenley, RN Outcome: Progressing 09/11/2018 0352 by Lelan PonsAriwodo, Tyleah Loh, RN Outcome: Progressing   Problem: Health Behavior/Discharge Planning: Goal: Ability to make decisions will improve 09/11/2018 0352 by Lelan PonsAriwodo, Nilaya Bouie, RN Outcome: Progressing 09/11/2018 0352 by Lelan PonsAriwodo, Lee-Anne Flicker, RN Outcome: Progressing Goal: Compliance with therapeutic regimen will improve 09/11/2018 0352 by Lelan PonsAriwodo, Ragna Kramlich, RN Outcome: Progressing 09/11/2018 0352 by Lelan PonsAriwodo, Alto Gandolfo, RN Outcome: Progressing   Problem: Role Relationship: Goal: Will demonstrate positive changes in social behaviors and relationships 09/11/2018 0352 by Lelan PonsAriwodo, Elman Dettman, RN Outcome: Progressing 09/11/2018 0352 by Lelan PonsAriwodo, Keirstyn Aydt, RN Outcome: Progressing   Problem: Safety: Goal: Ability to disclose and discuss suicidal ideas will improve 09/11/2018 0352 by Lelan PonsAriwodo, Mylinh Cragg, RN Outcome: Progressing 09/11/2018 0352 by Lelan PonsAriwodo, Miyoko Hashimi, RN Outcome: Progressing Goal: Ability to identify and utilize support systems that promote safety will improve 09/11/2018 0352 by Lelan PonsAriwodo, Naylee Frankowski, RN Outcome: Progressing 09/11/2018 0352 by Lelan PonsAriwodo, Arael Piccione, RN Outcome: Progressing   Problem: Self-Concept: Goal: Will verbalize positive feelings about self 09/11/2018 0352 by Lelan PonsAriwodo, Maryana Pittmon, RN Outcome: Progressing 09/11/2018 0352 by Lelan PonsAriwodo, Clester Chlebowski, RN Outcome: Progressing Goal: Level of anxiety will decrease 09/11/2018 0352 by Lelan PonsAriwodo, Leldon Steege, RN Outcome: Progressing 09/11/2018 0352 by Lelan PonsAriwodo, Helaine Yackel,  RN Outcome: Progressing   Problem: Education: Goal: Ability to state activities that reduce stress will improve 09/11/2018 0352 by Lelan PonsAriwodo, Emmilee Reamer, RN Outcome: Progressing 09/11/2018 0352 by Lelan PonsAriwodo, Roberta Kelly, RN Outcome: Progressing   Problem: Coping: Goal: Ability to identify and develop effective coping behavior will improve 09/11/2018 0352 by Lelan PonsAriwodo, Kru Allman, RN Outcome: Progressing 09/11/2018 0352 by Lelan PonsAriwodo, Terrelle Ruffolo, RN Outcome: Progressing   Problem: Self-Concept: Goal: Ability to identify factors that promote anxiety will improve 09/11/2018 0352 by Lelan PonsAriwodo, Khy Pitre, RN Outcome: Progressing 09/11/2018  [id number] by Clemens Catholic, RN Outcome: Progressing Goal: Level of anxiety will decrease 09/11/2018 0352 by Clemens Catholic, RN Outcome: Progressing 09/11/2018 0352 by Clemens Catholic, RN Outcome: Progressing Goal: Ability to modify response to factors that promote anxiety will improve 09/11/2018 0352 by Clemens Catholic, RN Outcome: Progressing 09/11/2018 0352 by Clemens Catholic, RN Outcome: Progressing   Problem: Education: Goal: Knowledge of disease or condition will improve 09/11/2018 0352 by Clemens Catholic, RN Outcome: Progressing 09/11/2018 0352 by Clemens Catholic, RN Outcome: Progressing Goal: Understanding of discharge needs will improve 09/11/2018 0352 by Clemens Catholic, RN Outcome: Progressing 09/11/2018 0352 by Clemens Catholic, RN Outcome: Progressing   Problem: Health Behavior/Discharge Planning: Goal: Ability to identify changes in lifestyle to reduce recurrence of condition will improve 09/11/2018 0352 by Clemens Catholic, RN Outcome: Progressing 09/11/2018 0352 by Clemens Catholic, RN Outcome: Progressing Goal: Identification of resources available to assist in meeting health care needs will improve 09/11/2018 0352 by Clemens Catholic, RN Outcome: Progressing 09/11/2018 0352 by Clemens Catholic, RN Outcome:  Progressing   Problem: Physical Regulation: Goal: Complications related to the disease process, condition or treatment will be avoided or minimized 09/11/2018 0352 by Clemens Catholic, RN Outcome: Progressing 09/11/2018 0352 by Clemens Catholic, RN Outcome: Progressing   Problem: Safety: Goal: Ability to remain free from injury will improve 09/11/2018 0352 by Clemens Catholic, RN Outcome: Progressing 09/11/2018 0352 by Clemens Catholic, RN Outcome: Progressing

## 2018-09-11 NOTE — [facility] Group Notes (Signed)
[facility] Group Notes:  (Nursing/MHT/Case Management/Adjunct)  Date:  09/11/2018  Time:  9:22 PM  Type of Therapy:  Group Therapy  Participation Level:  Minimal  Participation Quality:  Appropriate  Affect:  Appropriate  Cognitive:  Alert  Insight:  Good  Engagement in Group:  Engaged  Modes of Intervention:  Support  Summary of Progress/Problems:  [name] 09/11/2018, 9:22 PM

## 2018-09-11 NOTE — Progress Notes (Signed)
Patient is appropriate stable and alert , patient expressed the thoughts of been  overwhelmed with psychosis but denies depression and anxiety, and denies any SI/HI/AVH , patient described difficulty concentrating and feeling fatigue, medication is taken with good compliant, education and support is provided , patient will make positive statements regarding self and the ability to cope with stress of life. Patient acknowledged information provided, states sleep is good and appetite is fair, patient is restful and requires routine 15 minutes safety check, no distress.

## 2018-09-11 NOTE — Plan of Care (Signed)
Voice of no safety  concerns . Working on  anxiety, coping and decision  making  techniques  to improve thoughts  activities to help reduce stress, denies suicidal ideations . Complaint  with medications given this shift  participatory with unit programing  attending  unit therapy group  opportunity to vent feelings .    Problem: Coping: Goal: Coping ability will improve Outcome: Progressing   Problem: Health Behavior/Discharge Planning: Goal: Identification of resources available to assist in meeting health care needs will improve Outcome: Progressing   Problem: Medication: Goal: Compliance with prescribed medication regimen will improve Outcome: Progressing   Problem: Self-Concept: Goal: Ability to disclose and discuss suicidal ideas will improve Outcome: Progressing Goal: Will verbalize positive feelings about self Outcome: Progressing   Problem: Self-Concept: Goal: Ability to disclose and discuss suicidal ideas will improve Outcome: Progressing Goal: Will verbalize positive feelings about self Outcome: Progressing   Problem: Education: Goal: Utilization of techniques to improve thought processes will improve Outcome: Progressing Goal: Knowledge of the prescribed therapeutic regimen will improve Outcome: Progressing   Problem: Activity: Goal: Interest or engagement in leisure activities will improve Outcome: Progressing Goal: Imbalance in normal sleep/wake cycle will improve Outcome: Progressing   Problem: Coping: Goal: Coping ability will improve Outcome: Progressing Goal: Will verbalize feelings Outcome: Progressing   Problem: Health Behavior/Discharge Planning: Goal: Ability to make decisions will improve Outcome: Progressing Goal: Compliance with therapeutic regimen will improve Outcome: Progressing   Problem: Role Relationship: Goal: Will demonstrate positive changes in social behaviors and relationships Outcome: Progressing   Problem: Safety: Goal:  Ability to disclose and discuss suicidal ideas will improve Outcome: Progressing Goal: Ability to identify and utilize support systems that promote safety will improve Outcome: Progressing   Problem: Self-Concept: Goal: Will verbalize positive feelings about self Outcome: Progressing Goal: Level of anxiety will decrease Outcome: Progressing

## 2018-09-12 MED ORDER — IBUPROFEN 600 MG PO TABS
800.0000 mg | ORAL_TABLET | Freq: Four times a day (QID) | ORAL | Status: DC | PRN
  Administered 2018-09-15 (×2): 800 mg via ORAL
  Filled 2018-09-12 (×2): qty 1

## 2018-09-12 NOTE — Plan of Care (Signed)
Patient stayed in her room most of the time.Denies SI,HI and AVH.Compliant with medications.Did not attend groups.Appetite and energy level good.Support and encouragement given.

## 2018-09-12 NOTE — [facility] Group Notes (Signed)
LCSW Group Therapy Note  09/12/2018 2:12 PM  Type of Therapy/Topic:  Group Therapy:  Balance in Life  Participation Level:  Did Not Attend  Description of Group:    This group will address the concept of balance and how it feels and looks when one is unbalanced. Patients will be encouraged to process areas in their lives that are out of balance and identify reasons for remaining unbalanced. Facilitators will guide patients in utilizing problem-solving interventions to address and correct the stressor making their life unbalanced. Understanding and applying boundaries will be explored and addressed for obtaining and maintaining a balanced life. Patients will be encouraged to explore ways to assertively make their unbalanced needs known to significant others in their lives, using other group members and facilitator for support and feedback.  Therapeutic Goals: 1. Patient will identify two or more emotions or situations they have that consume much of in their lives. 2. Patient will identify signs/triggers that life has become out of balance:  3. Patient will identify two ways to set boundaries in order to achieve balance in their lives:  4. Patient will demonstrate ability to communicate their needs through discussion and/or role plays  Summary of Patient Progress: x     Therapeutic Modalities:   Cognitive Behavioral Therapy Solution-Focused Therapy Assertiveness Training  Demetris Capell, MSW, LCSW Clinical Social Work 09/12/2018 2:12 PM   

## 2018-09-12 NOTE — Progress Notes (Signed)
D - Patient was in her room upon arrival to the unit. Patient presented calm and cooperative during assessment. Patient denies SI/HI/AVH, anxiety and depression. Patient stated she had some pain in her hip and rated it 6/10 (See MAR) Patient said she was here for substance abuse.   A - Patient compliant with medication administration per MD orders and procedures on the unit. Patient given education. Patient given support and encouragement to be active in her treatment plan. Patient informed to let staff know if there are any issues or problems on the unit.   R - Patient being monitored Q 15 min for safety per unit protocol. Patient remains safe on the unit.

## 2018-09-12 NOTE — Progress Notes (Signed)
[facility]  09/12/2018 5:53 PM Jasper RilingRyan N Lohmeyer  MRN:  [id number] Subjective: Patient seen chart reviewed.  Patient reports she is feeling better.  No longer has racing thoughts.  Denies suicidal or homicidal ideation.  Mostly stays isolated to her self.  Says she is having a headache today.  Tolerating medicine well. Principal Problem: Bipolar 1 disorder, manic, moderate ([name]) Diagnosis: Principal Problem:   Bipolar 1 disorder, manic, moderate ([name]) Active Problems:   Alcohol withdrawal ([name])   Alcohol abuse   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[name])   Amphetamine abuse ([name])   Diabetes ([name])  Total Time spent with patient: 30 minutes  Past Psychiatric History: History of bipolar disorder and substance abuse  Past Medical History:  Past Medical History:  Diagnosis Date  . Alcohol abuse   . Anxiety   . Anxiety   . Anxiety and depression   . Asthma   . Bipolar disorder ([name])   . Borderline personality disorder ([name])   . Depression   . Diabetes ([name])    Type 2   . Dyslipidemia   . Esophageal varices ([name])   . Foot drop    right  . 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[name])   . GERD (gastroesophageal reflux disease)   . Gout   . History of drug abuse ([name])   . Insomnia   . Muscular dystrophies ([name])   . Neuropathy   . Pancreatitis   . 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 HERNIA REPAIR     Family History:  Family History  Problem Relation Age of Onset  . Heart disease Mother   . Heart attack Mother   . Hyperlipidemia Maternal Grandmother   . Diabetes Maternal Grandmother   . Hypertension Maternal Grandmother   . Hyperlipidemia Maternal Grandfather   . Heart disease Maternal Grandfather   . Heart attack Maternal Grandfather   . Diabetes Maternal Grandfather   . Lung cancer Maternal Grandfather   . Hypertension Maternal Grandfather   . Other Paternal Grandmother        Thyroid problems    . Heart disease Paternal Grandfather   . Stroke Paternal Grandfather   . Hyperlipidemia Paternal Grandfather   . Heart attack Paternal Grandfather   . Hypertension Paternal Grandfather    Family Psychiatric  History: See previous Social History:  Social History   Substance and Sexual Activity  Alcohol Use Not Currently   Comment: in rehab     Social History   Substance and Sexual Activity  Drug Use No   Comment: in rehab    Social History   Socioeconomic History  . Marital status: Legally Separated    Spouse name: Not on file  . Number of children: Not on file  . Years of education: Not on file  . Highest education level: Not on file  Occupational History  . Occupation: disabled  Social Needs  . Financial resource strain: Not on file  . Food insecurity    Worry: Not on file    Inability: Not on file  . Transportation needs    Medical: Not on file    Non-medical: Not on file  Tobacco Use  . Smoking status: Current Every Day Smoker    Packs/day: 0.50  . Smokeless tobacco: Never Used  Substance and Sexual Activity  . Alcohol use: Not Currently    Comment: in rehab  . Drug use: No    Comment: in rehab  .  Sexual activity: Yes    Birth control/protection: None  Lifestyle  . Physical activity    Days per week: Not on file    Minutes per session: Not on file  . Stress: Not on file  Relationships  . Social Musicianconnections    Talks on phone: Not on file    Gets together: Not on file    Attends religious service: Not on file    Active member of club or organization: Not on file    Attends meetings of clubs or organizations: Not on file    Relationship status: Not on file  Other Topics Concern  . Not on file  Social History Narrative  . Not on file   Additional Social History:                         Sleep: Fair  Appetite:  Fair  Current Medications: Current Facility-Administered Medications  Medication Dose Route Frequency Provider Last Rate  Last Dose  . albuterol (VENTOLIN HFA) 108 (90 Base) MCG/ACT inhaler 2 puff  2 puff Inhalation Q6H PRN Catalina Gravelhomspon, Jacqueline, NP      . alum & mag hydroxide-simeth (MAALOX/MYLANTA) 200-200-20 MG/5ML suspension 30 mL  30 mL Oral Q4H PRN Thomspon, Adela LankJacqueline, NP      . atorvastatin (LIPITOR) tablet 40 mg  40 mg Oral Daily Thomspon, Adela LankJacqueline, NP   40 mg at 09/12/18 0809  . calcium-vitamin D (OSCAL WITH D) 500-200 MG-UNIT per tablet 1 tablet  1 tablet Oral Q breakfast Catalina Gravelhomspon, Jacqueline, NP   1 tablet at 09/12/18 0809  . cariprazine (VRAYLAR) capsule 3 mg  3 mg Oral Daily Catalina Gravelhomspon, Jacqueline, NP   3 mg at 09/12/18 0809  . clonazePAM (KLONOPIN) tablet 1 mg  1 mg Oral TID PRN Clapacs, Jackquline DenmarkJohn T, MD   1 mg at 09/11/18 2139  . diphenoxylate-atropine (LOMOTIL) 2.5-0.025 MG per tablet 2 tablet  2 tablet Oral QID PRN Catalina Gravelhomspon, Jacqueline, NP      . FLUoxetine (PROZAC) capsule 80 mg  80 mg Oral Daily Catalina Gravelhomspon, Jacqueline, NP   80 mg at 09/12/18 0809  . fluticasone (FLONASE) 50 MCG/ACT nasal spray 1 spray  1 spray Each Nare Daily Catalina Gravelhomspon, Jacqueline, NP   1 spray at 09/11/18 0802  . hydrOXYzine (ATARAX/VISTARIL) tablet 25 mg  25 mg Oral Q6H PRN Catalina Gravelhomspon, Jacqueline, NP      . ibuprofen (ADVIL) tablet 800 mg  800 mg Oral Q6H PRN Clapacs, John T, MD      . magnesium hydroxide (MILK OF MAGNESIA) suspension 30 mL  30 mL Oral Daily PRN Catalina Gravelhomspon, Jacqueline, NP      . metFORMIN (GLUCOPHAGE) tablet 500 mg  500 mg Oral BID WC Thomspon, Adela LankJacqueline, NP   500 mg at 09/12/18 1725  . multivitamin with minerals tablet 1 tablet  1 tablet Oral Daily Catalina Gravelhomspon, Jacqueline, NP   1 tablet at 09/12/18 0809  . naproxen (NAPROSYN) tablet 250 mg  250 mg Oral BID BM & HS PRN Money, Feliz Beamravis B, FNP      . nicotine (NICODERM CQ - dosed in mg/24 hours) patch 21 mg  21 mg Transdermal Daily Clapacs, Jackquline DenmarkJohn T, MD   21 mg at 09/12/18 [id number]  . Norgestimate-Ethinyl Estradiol Triphasic 0.18/0.215/0.25 MG-25 MCG tablet 1 tablet  1 tablet Oral Daily  Clapacs, Jackquline DenmarkJohn T, MD   1 tablet at 09/12/18 1206  . pantoprazole (PROTONIX) EC tablet 40 mg  40 mg Oral Daily Catalina Gravelhomspon, Jacqueline, NP  40 mg at 09/12/18 0809  . pregabalin (LYRICA) capsule 50 mg  50 mg Oral TID Lamont Dowdy, NP   50 mg at 09/12/18 1725  . topiramate (TOPAMAX) tablet 200 mg  200 mg Oral QHS Thomspon, Geni Bers, NP   200 mg at 09/11/18 2139  . zolpidem (AMBIEN) tablet 5 mg  5 mg Oral QHS Clapacs, Madie Reno, MD   5 mg at 09/11/18 2139    Lab Results: No results found for this or any previous visit (from the past 48 hour(s)).  Blood Alcohol level:  Lab Results  Component Value Date   ETH <10 09/09/2018   ETH 162 (H) 21/19/4174    Metabolic Disorder Labs: Lab Results  Component Value Date   HGBA1C 5.2 09/27/2012   MPG 103 09/27/2012   No results found for: PROLACTIN No results found for: CHOL, TRIG, HDL, CHOLHDL, VLDL, LDLCALC  Physical Findings: AIMS: Facial and Oral Movements Muscles of Facial Expression: None, normal Lips and Perioral Area: None, normal Jaw: None, normal Tongue: None, normal,Extremity Movements Upper (arms, wrists, hands, fingers): None, normal Lower (legs, knees, ankles, toes): None, normal, Trunk Movements Neck, shoulders, hips: None, normal, Overall Severity Severity of abnormal movements (highest score from questions above): None, normal Incapacitation due to abnormal movements: None, normal Patient's awareness of abnormal movements (rate only patient's report): No Awareness, Dental Status Current problems with teeth and/or dentures?: No Does patient usually wear dentures?: No  CIWA:  CIWA-Ar Total: 2 COWS:  COWS Total Score: 3  Musculoskeletal: Strength & Muscle Tone: within normal limits Gait & Station: normal Patient leans: N/A  Psychiatric Specialty Exam: Physical Exam  Nursing note and vitals reviewed. Constitutional: She appears well-developed and well-nourished.  HENT:  Head: Normocephalic and atraumatic.  Eyes:  Pupils are equal, round, and reactive to light. Conjunctivae are normal.  Neck: Normal range of motion.  Cardiovascular: Regular rhythm and normal heart sounds.  Respiratory: Effort normal. No respiratory distress.  GI: Soft.  Musculoskeletal: Normal range of motion.  Neurological: She is alert.  Skin: Skin is warm and dry.  Psychiatric: Judgment normal. Her affect is blunt. Her speech is delayed. She is slowed. Thought content is not paranoid. Cognition and memory are normal. She expresses no homicidal and no suicidal ideation.    Review of Systems  Constitutional: Negative.   HENT: Negative.   Eyes: Negative.   Respiratory: Negative.   Cardiovascular: Negative.   Gastrointestinal: Negative.   Musculoskeletal: Negative.   Skin: Negative.   Neurological: Negative.   Psychiatric/Behavioral: Negative.     Blood pressure (!) 131/105, pulse (!) 113, temperature 98.2 F (36.8 C), temperature source Oral, resp. rate 18, height 5\' 7"  (1.702 m), weight 92.1 kg, SpO2 98 %.Body mass index is 31.79 kg/m.  General Appearance: Casual  Eye Contact:  Fair  Speech:  Clear and Coherent  Volume:  Normal  Mood:  Anxious  Affect:  Constricted  Thought Process:  Coherent  Orientation:  Full (Time, Place, and Person)  Thought Content:  Logical  Suicidal Thoughts:  No  Homicidal Thoughts:  No  Memory:  Immediate;   Fair Recent;   Fair Remote;   Fair  Judgement:  Fair  Insight:  Fair  Psychomotor Activity:  Normal  Concentration:  Concentration: Fair  Recall:  [employer] of Knowledge:  Fair  Language:  Fair  Akathisia:  No  Handed:  Right  AIMS (if indicated):     Assets:  Desire for Improvement  ADL's:  Intact  Cognition:  WNL  Sleep:  Number of Hours: 7.15     Treatment Plan Summary: Daily contact with patient to assess and evaluate symptoms and progress in treatment, Medication management and Plan Supportive therapy and counseling.  Review of medicine.  Added headache pain.   Tentative plan for discharge after the weekend.  Mordecai RasmussenJohn Clapacs, MD 09/12/2018, 5:53 PM

## 2018-09-12 NOTE — Plan of Care (Signed)
Patient compliant with medication administration per MD orders  Problem: Medication: Goal: Compliance with prescribed medication regimen will improve Outcome: Progressing   

## 2018-09-12 NOTE — Progress Notes (Signed)
Recreation Therapy Notes   Date: 09/12/2018  Time: 9:30 am   Location: Craft room   Behavioral response: N/A   Intervention Topic: Self-care  Discussion/Intervention: Patient did not attend group.   Clinical Observations/Feedback:  Patient did not attend group.   Nerine Pulse LRT/CTRS         Zarria Towell 09/12/2018 10:34 AM

## 2018-09-13 NOTE — Progress Notes (Signed)
Patient states that she generally doing well with her meds her mood has improved but still feels down at down, sleep is better has no suicide plans and has not thoughts about suicide, delusions or hallucinations , she thinks group participation has helped her with coping mechanizes no distress 15 minutes safety checks is maintained.

## 2018-09-13 NOTE — Progress Notes (Signed)
[facility] MD Progress Note  09/13/2018 12:24 PM [name]  MRN:  [id number] Subjective: Patient is a 43 year old female with a past psychiatric history significant for bipolar disorder type I, most recently manic, moderate to severe without psychotic features who was admitted on 6/24 with mood instability and disorganized thinking thinking.  She also had a feeling of being unsafe with her self.  She was having some hallucinations feeling vague suicidal thoughts.  Objective: Patient is seen and examined.  Patient is a 43 year old female with the above-stated past psychiatric history who is seen in follow-up.  Her main complaint this a.m. is continued headache.  She had a headache last night and did not sleep well.  She reported her mood was improving, and denied any suicidal or homicidal ideation.  She has remained isolated.  She stated that Aleve and another medication did help her headaches in the past.  Her current medications include Lipitor, Vraylar, Klonopin, Prozac, Advil, metformin, Naprosyn, birth control pills, Protonix, Lyrica as well as Topamax and Ambien.  Her blood pressure slightly elevated at 131/91, she is afebrile.  She was mildly tachycardic at a rate of 104.  Nursing notes reflect that she slept 7.5 hours last night.  Review of her laboratories revealed an elevated glucose on 6/23 of 221.  Her white count was mildly elevated at 12.  Her AST was elevated at 69.  Her blood alcohol on admission was less than 10.  Her drug screen on admission was positive for amphetamines, benzodiazepines, cocaine and cannabinoids.  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 Alcohol abuse   . Anxiety   . Anxiety   . Anxiety and depression   . Asthma   . Bipolar disorder ([place])   . Borderline personality disorder ([employer])   . Depression   . Diabetes ([place])    Type 2   . Dyslipidemia   . Esophageal varices ([place])   . Foot drop    right  . 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 GERD (gastroesophageal reflux disease)   . Gout   . History of drug abuse ([place])   . Insomnia   . Muscular dystrophies ([place])   . Neuropathy   . Pancreatitis   . 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 HERNIA REPAIR     Family History:  Family History  Problem Relation Age of Onset  . Heart disease Mother   . Heart attack Mother   . Hyperlipidemia Maternal Grandmother   . Diabetes Maternal Grandmother   . Hypertension Maternal Grandmother   . Hyperlipidemia Maternal Grandfather   . Heart disease Maternal Grandfather   . Heart attack Maternal Grandfather   . Diabetes Maternal Grandfather   . Lung cancer Maternal Grandfather   . Hypertension Maternal Grandfather   . Other Paternal Grandmother        Thyroid problems   . Heart disease Paternal Grandfather   . Stroke Paternal Grandfather   . Hyperlipidemia Paternal Grandfather   . Heart attack Paternal Grandfather   . Hypertension Paternal Grandfather  Family Psychiatric  History: See admission H&P Social History:  Social History   Substance and Sexual Activity  Alcohol Use Not Currently   Comment: in rehab     Social History   Substance and Sexual Activity  Drug Use No   Comment: in rehab    Social History   Socioeconomic History  . Marital status: Legally Separated    Spouse name: Not on file  . Number of children: Not on file  . Years of education: Not on file  . Highest education level: Not on file  Occupational History  . Occupation: disabled  Social Needs  .  Financial resource strain: Not on file  . Food insecurity    Worry: Not on file    Inability: Not on file  . Transportation needs    Medical: Not on file    Non-medical: Not on file  Tobacco Use  . Smoking status: Current Every Day Smoker    Packs/day: 0.50  . Smokeless tobacco: Never Used  Substance and Sexual Activity  . Alcohol use: Not Currently    Comment: in rehab  . Drug use: No    Comment: in rehab  . Sexual activity: Yes    Birth control/protection: None  Lifestyle  . Physical activity    Days per week: Not on file    Minutes per session: Not on file  . Stress: Not on file  Relationships  . Social Musicianconnections    Talks on phone: Not on file    Gets together: Not on file    Attends religious service: Not on file    Active member of club or organization: Not on file    Attends meetings of clubs or organizations: Not on file    Relationship status: Not on file  Other Topics Concern  . Not on file  Social History Narrative  . Not on file   Additional Social History:                         Sleep: Fair  Appetite:  Fair  Current Medications: Current Facility-Administered Medications  Medication Dose Route Frequency Provider Last Rate Last Dose  . albuterol (VENTOLIN HFA) 108 (90 Base) MCG/ACT inhaler 2 puff  2 puff Inhalation Q6H PRN Catalina Gravelhomspon, Jacqueline, NP      . alum & mag hydroxide-simeth (MAALOX/MYLANTA) 200-200-20 MG/5ML suspension 30 mL  30 mL Oral Q4H PRN Thomspon, Adela LankJacqueline, NP      . atorvastatin (LIPITOR) tablet 40 mg  40 mg Oral Daily Thomspon, Adela LankJacqueline, NP   40 mg at 09/13/18 0816  . calcium-vitamin D (OSCAL WITH D) 500-200 MG-UNIT per tablet 1 tablet  1 tablet Oral Q breakfast Catalina Gravelhomspon, Jacqueline, NP   1 tablet at 09/13/18 0817  . cariprazine (VRAYLAR) capsule 3 mg  3 mg Oral Daily Catalina Gravelhomspon, Jacqueline, NP   3 mg at 09/13/18 0817  . clonazePAM (KLONOPIN) tablet 1 mg  1 mg Oral TID PRN Clapacs, Jackquline DenmarkJohn T, MD   1 mg at 09/13/18 0815  .  diphenoxylate-atropine (LOMOTIL) 2.5-0.025 MG per tablet 2 tablet  2 tablet Oral QID PRN Catalina Gravelhomspon, Jacqueline, NP      . FLUoxetine (PROZAC) capsule 80 mg  80 mg Oral Daily Catalina Gravelhomspon, Jacqueline, NP   80 mg at 09/13/18 0816  . fluticasone (FLONASE) 50 MCG/ACT nasal spray 1 spray  1 spray Each Nare Daily Catalina Gravelhomspon, Jacqueline, NP   1 spray at 09/13/18 0817  . hydrOXYzine (ATARAX/VISTARIL) tablet 25  mg  25 mg Oral Q6H PRN Catalina Gravelhomspon, Jacqueline, NP      . ibuprofen (ADVIL) tablet 800 mg  800 mg Oral Q6H PRN Clapacs, John T, MD      . magnesium hydroxide (MILK OF MAGNESIA) suspension 30 mL  30 mL Oral Daily PRN Catalina Gravelhomspon, Jacqueline, NP      . metFORMIN (GLUCOPHAGE) tablet 500 mg  500 mg Oral BID WC Catalina Gravelhomspon, Jacqueline, NP   500 mg at 09/13/18 16100815  . multivitamin with minerals tablet 1 tablet  1 tablet Oral Daily Catalina Gravelhomspon, Jacqueline, NP   1 tablet at 09/13/18 0815  . naproxen (NAPROSYN) tablet 250 mg  250 mg Oral BID BM & HS PRN Money, Feliz Beamravis B, FNP      . nicotine (NICODERM CQ - dosed in mg/24 hours) patch 21 mg  21 mg Transdermal Daily Clapacs, Jackquline DenmarkJohn T, MD   21 mg at 09/13/18 0820  . Norgestimate-Ethinyl Estradiol Triphasic 0.18/0.215/0.25 MG-25 MCG tablet 1 tablet  1 tablet Oral Daily Clapacs, Jackquline DenmarkJohn T, MD   1 tablet at 09/13/18 1148  . pantoprazole (PROTONIX) EC tablet 40 mg  40 mg Oral Daily Catalina Gravelhomspon, Jacqueline, NP   40 mg at 09/13/18 0816  . pregabalin (LYRICA) capsule 50 mg  50 mg Oral TID Catalina Gravelhomspon, Jacqueline, NP   50 mg at 09/13/18 1149  . topiramate (TOPAMAX) tablet 200 mg  200 mg Oral QHS Thomspon, Adela LankJacqueline, NP   200 mg at 09/12/18 2227  . zolpidem (AMBIEN) tablet 5 mg  5 mg Oral QHS Clapacs, Jackquline DenmarkJohn T, MD   5 mg at 09/12/18 2227    Lab Results: No results found for this or any previous visit (from the past 48 hour(s)).  Blood Alcohol level:  Lab Results  Component Value Date   ETH <10 09/09/2018   ETH 162 (H) 06/03/2016    Metabolic Disorder Labs: Lab Results  Component Value Date    HGBA1C 5.2 09/27/2012   MPG 103 09/27/2012   No results found for: PROLACTIN No results found for: CHOL, TRIG, HDL, CHOLHDL, VLDL, LDLCALC  Physical Findings: AIMS: Facial and Oral Movements Muscles of Facial Expression: None, normal Lips and Perioral Area: None, normal Jaw: None, normal Tongue: None, normal,Extremity Movements Upper (arms, wrists, hands, fingers): None, normal Lower (legs, knees, ankles, toes): None, normal, Trunk Movements Neck, shoulders, hips: None, normal, Overall Severity Severity of abnormal movements (highest score from questions above): None, normal Incapacitation due to abnormal movements: None, normal Patient's awareness of abnormal movements (rate only patient's report): No Awareness, Dental Status Current problems with teeth and/or dentures?: No Does patient usually wear dentures?: No  CIWA:  CIWA-Ar Total: 2 COWS:  COWS Total Score: 3  Musculoskeletal: Strength & Muscle Tone: within normal limits Gait & Station: normal Patient leans: N/A  Psychiatric Specialty Exam: Physical Exam  Nursing note and vitals reviewed. Constitutional: She is oriented to person, place, and time. She appears well-developed and well-nourished.  HENT:  Head: Normocephalic and atraumatic.  Respiratory: Effort normal.  Neurological: She is alert and oriented to person, place, and time.    ROS  Blood pressure (!) 131/91, pulse (!) 104, temperature (!) 97.4 F (36.3 C), temperature source Oral, resp. rate 17, height 5\' 7"  (1.702 m), weight 92.1 kg, SpO2 98 %.Body mass index is 31.79 kg/m.  General Appearance: Disheveled  Eye Contact:  Fair  Speech:  Normal Rate  Volume:  Normal  Mood:  Anxious, Depressed and Irritable  Affect:  Congruent  Thought Process:  Coherent  and Descriptions of Associations: Circumstantial  Orientation:  Full (Time, Place, and Person)  Thought Content:  Logical  Suicidal Thoughts:  Yes.  without intent/plan  Homicidal Thoughts:  No  Memory:   Immediate;   Fair Recent;   Fair Remote;   Fair  Judgement:  Intact  Insight:  Lacking  Psychomotor Activity:  Increased  Concentration:  Concentration: Fair and Attention Span: Fair  Recall:  FiservFair  Fund of Knowledge:  Fair  Language:  Fair  Akathisia:  Negative  Handed:  Right  AIMS (if indicated):     Assets:  Desire for Improvement Resilience  ADL's:  Intact  Cognition:  WNL  Sleep:  Number of Hours: 7.5     Treatment Plan Summary: Daily contact with patient to assess and evaluate symptoms and progress in treatment, Medication management and Plan : Patient is a 43 year old female with the above-stated past psychiatric history who is seen in follow-up.   Diagnosis: #1 bipolar disorder, most recently manic, severe without psychotic features, #2 polysubstance use disorder/dependence, #3 migraine headache, #4 diabetes mellitus type 2.  Patient is seen and examined.  Patient is a 43 year old female with the above-stated past psychiatric history is seen in follow-up.  She stated her mood was doing better.  She seems less labile.  She complained primarily of a headache.  No change in her psychiatric medicines.  She had previously received Naprosyn which helped her headaches.  That is already been written for.  She also has Advil written.  The Lyrica should also help her chronic headaches as well as the Topamax.  If necessary, we can consider giving her Reglan if the headache does not resolve.  No change in her psychiatric medications at this time.  I am going to add daily blood sugars just to make sure that her sugars are staying down and that that is not contributing to her headache.  As well, we will monitor her blood pressure and see if that improves with her headache improvement. 1.  Continue Lipitor 40 mg p.o. daily for hyperlipidemia. 2.  Continue vitamin D supplementation for vitamin D deficiency. 3.  Continue Vraylar 3 mg p.o. daily for mood stability as well as psychosis. 4.   Continue clonazepam 1 mg p.o. 3 times daily as needed anxiety. 5.  Continue fluoxetine 80 mg p.o. daily for mood and anxiety. 6.  Continue hydroxyzine 25 mg p.o. every 6 hours as needed anxiety. 7.  Continue ibuprofen 800 mg p.o. every 6 hours as needed pain. 8.  Continue Naprosyn 250 mg p.o. 3 times daily as needed migraine headache. 9.  Continue Metformin 500 mg p.o. twice daily for diabetes mellitus type 2 10.  Check daily blood sugars to make sure sugar under control.  11.  Continue birth control pills. 12.  Continue Protonix 40 mg p.o. daily for GERD. 13.  Continue Lyrica 50 mg p.o. 3 times daily for fibromyalgia and pain. 14.  Continue Topamax 200 mg p.o. nightly for migraine headache prevention as well as mood stability. 15.  Continue Ambien 5 mg p.o. nightly as needed insomnia. 16.  Disposition planning-in progress.   Antonieta PertGreg Lawson Clary, MD 09/13/2018, 12:24 PM

## 2018-09-13 NOTE — [facility] Group Notes (Signed)
LCSW Group Therapy Note  09/13/2018  1:00 PM   Type of Therapy and Topic:  Group Therapy:  Trust and Honesty   Participation Level:  Did Not Attend   Description of Group:    In this group patients will be asked to explore the value of being honest.  Patients will be guided to discuss their thoughts, feelings, and behaviors related to honesty and trusting in others. Patients will process together how trust and honesty relate to forming relationships with peers, family members, and self. Each patient will be challenged to identify and express feelings of being vulnerable. Patients will discuss reasons why people are dishonest and identify alternative outcomes if one was truthful (to self or others). This group will be process-oriented, with patients participating in exploration of their own experiences, giving and receiving support, and processing challenge from other group members.   Therapeutic Goals: 1. Patient will identify why honesty is important to relationships and how honesty overall affects relationships.  2. Patient will identify a situation where they lied or were lied too and the  feelings, thought process, and behaviors surrounding the situation 3. Patient will identify the meaning of being vulnerable, how that feels, and how that correlates to being honest with self and others. 4. Patient will identify situations where they could have told the truth, but instead lied and explain reasons of dishonesty.   Summary of Patient Progress:  X    Therapeutic Modalities:   Cognitive Behavioral Therapy Solution Focused Therapy Motivational Interviewing Brief Therapy  Assunta Curtis, MSW, LCSW 09/13/2018 10:44 AM

## 2018-09-13 NOTE — Plan of Care (Signed)
Patient denies having thoughts of wanting to self harm. Patient is cooperative  with all treatment plans. Patient reports that she slept poor last night with the use of sleep medication. Patient's  thoughts are organized and coherent, she appears to be anxious requesting prn medication to help control that. She is interacting with peers and staff appropriately. Patient was offered support and encouraged to attend therapeutic groups. Patient  is complaint with medication regimen. Patient is receptive to treatment and safety maintained on unit.

## 2018-09-14 NOTE — Plan of Care (Signed)
Active in the milieu, pleasant and cooperative. Denying thoughts of self harm. Denying hallucinations. Compliant with treatment.

## 2018-09-14 NOTE — [facility] Group Notes (Signed)
LCSW Group Therapy Note  09/14/2018 11:30 AM   Type of Therapy and Topic: Group Therapy: Feelings Around Returning Home & Establishing a Supportive Framework and Supporting Oneself When Supports Not Available   Participation Level:  Did Not Attend   Description of Group:  Patients first processed thoughts and feelings about upcoming discharge. These included fears of upcoming changes, lack of change, new living environments, judgements and expectations from others and overall stigma of mental health issues. The group then discussed the definition of a supportive framework, what that looks and feels like, and how do to discern it from an unhealthy non-supportive network. The group identified different types of supports as well as what to do when your family/friends are less than helpful or unavailable   Therapeutic Goals  1. Patient will identify one healthy supportive network that they can use at discharge. 2. Patient will identify one factor of a supportive framework and how to tell it from an unhealthy network. 3. Patient able to identify one coping skill to use when they do not have positive supports from others. 4. Patient will demonstrate ability to communicate their needs through discussion and/or role plays.   Summary of Patient Progress:  x    Therapeutic Modalities Cognitive Behavioral Therapy Motivational Interviewing    Bayla Mcgovern, MSW, LCSW Clinical Social Work 09/14/2018 11:30 AM   

## 2018-09-14 NOTE — Plan of Care (Signed)
Pt spent most of the evening in her room, but interacted appropriately with staff and peers when she was in milieu. No redirection was needed. Pt denies SI, HI, AVH. No distress noted. No needs expressed. Pt was bright when interacting. Pt was medicated as reflected in MAR. Pt safety checks performed per MD order.   Problem: Education: Goal: Ability to make informed decisions regarding treatment will improve Outcome: Progressing   Problem: Coping: Goal: Coping ability will improve Outcome: Progressing   Problem: Health Behavior/Discharge Planning: Goal: Identification of resources available to assist in meeting health care needs will improve Outcome: Progressing   Problem: Medication: Goal: Compliance with prescribed medication regimen will improve Outcome: Progressing   Problem: Self-Concept: Goal: Ability to disclose and discuss suicidal ideas will improve Outcome: Progressing Goal: Will verbalize positive feelings about self Outcome: Progressing   Problem: Education: Goal: Utilization of techniques to improve thought processes will improve Outcome: Progressing Goal: Knowledge of the prescribed therapeutic regimen will improve Outcome: Progressing   Problem: Activity: Goal: Interest or engagement in leisure activities will improve Outcome: Progressing Goal: Imbalance in normal sleep/wake cycle will improve Outcome: Progressing   Problem: Coping: Goal: Coping ability will improve Outcome: Progressing Goal: Will verbalize feelings Outcome: Progressing   Problem: Health Behavior/Discharge Planning: Goal: Ability to make decisions will improve Outcome: Progressing Goal: Compliance with therapeutic regimen will improve Outcome: Progressing   Problem: Role Relationship: Goal: Will demonstrate positive changes in social behaviors and relationships Outcome: Progressing   Problem: Safety: Goal: Ability to disclose and discuss suicidal ideas will improve Outcome:  Progressing Goal: Ability to identify and utilize support systems that promote safety will improve Outcome: Progressing   Problem: Self-Concept: Goal: Will verbalize positive feelings about self Outcome: Progressing Goal: Level of anxiety will decrease Outcome: Progressing   Problem: Education: Goal: Ability to state activities that reduce stress will improve Outcome: Progressing   Problem: Coping: Goal: Ability to identify and develop effective coping behavior will improve Outcome: Progressing   Problem: Self-Concept: Goal: Ability to identify factors that promote anxiety will improve Outcome: Progressing Goal: Level of anxiety will decrease Outcome: Progressing Goal: Ability to modify response to factors that promote anxiety will improve Outcome: Progressing   Problem: Education: Goal: Knowledge of disease or condition will improve Outcome: Progressing Goal: Understanding of discharge needs will improve Outcome: Progressing   Problem: Health Behavior/Discharge Planning: Goal: Ability to identify changes in lifestyle to reduce recurrence of condition will improve Outcome: Progressing Goal: Identification of resources available to assist in meeting health care needs will improve Outcome: Progressing   Problem: Physical Regulation: Goal: Complications related to the disease process, condition or treatment will be avoided or minimized Outcome: Progressing   Problem: Safety: Goal: Ability to remain free from injury will improve Outcome: Progressing

## 2018-09-14 NOTE — Plan of Care (Signed)
Patient denies having thoughts of wanting to self harm. Patient is cooperative  with all treatment plans. Patient reports that she slept fair last night with the use of sleep medication. Patient's  thoughts are organized and coherent, she appears to be anxious requesting prn medication to help control that. She is interacting with peers and staff appropriately. Noon dose of Lyrica held, patient received a prn Klonopin shortly before Lyrica was due. Patient was drowsy and gait was unsteady, dose held. Patient was offered support and encouraged to attend therapeutic groups. Patient  is complaint with medication regimen. Patient is receptive to treatment and safety maintained on unit.

## 2018-09-14 NOTE — Progress Notes (Signed)
[facility] MD Progress Note  09/14/2018 11:17 AM [name]  MRN:  [id number] Subjective:  Patient is a 43 year old female with a past psychiatric history significant for bipolar disorder type I, most recently manic, moderate to severe without psychotic features who was admitted on 6/24 with mood instability and disorganized thinking thinking.  She also had a feeling of being unsafe with her self.  She was having some hallucinations feeling vague suicidal thoughts.  Objective: Patient is seen in follow-up.  She is a 43 year old female with the above-stated past psychiatric history.  She stated that her headache was better today.  She stated she was not having pain.  She denied any suicidal ideation.  She denied any auditory or visual hallucinations.  She stated her mood was improving.  She is hoping to be able to go home tomorrow.  Her vital signs are stable, and she is afebrile.  She slept 7.25 hours last night.  Review of her laboratories revealed no new results.  Principal Problem: Bipolar 1 disorder, manic, moderate ([name]) Diagnosis: Principal Problem:   Bipolar 1 disorder, manic, moderate ([name]) Active Problems:   Alcohol withdrawal ([name])   Alcohol abuse   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[name])   Amphetamine abuse ([name])   Diabetes ([name])  Total Time spent with patient: 15 minutes  Past Psychiatric History: See admission H&P  Past Medical History:  Past Medical History:  Diagnosis Date  . Alcohol abuse   . Anxiety   . Anxiety   . Anxiety and depression   . Asthma   . Bipolar disorder ([name])   . Borderline personality disorder ([name])   . Depression   . Diabetes ([name])    Type 2   . Dyslipidemia   . Esophageal varices ([name])   . Foot drop    right  . 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[name])   . GERD (gastroesophageal reflux disease)   . Gout   . History of drug abuse ([name])   . Insomnia   . Muscular dystrophies ([name])   . Neuropathy   . Pancreatitis   .  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 HERNIA REPAIR     Family History:  Family History  Problem Relation Age of Onset  . Heart disease Mother   . Heart attack Mother   . Hyperlipidemia Maternal Grandmother   . Diabetes Maternal Grandmother   . Hypertension Maternal Grandmother   . Hyperlipidemia Maternal Grandfather   . Heart disease Maternal Grandfather   . Heart attack Maternal Grandfather   . Diabetes Maternal Grandfather   . Lung cancer Maternal Grandfather   . Hypertension Maternal Grandfather   . Other Paternal Grandmother        Thyroid problems   . Heart disease Paternal Grandfather   . Stroke Paternal Grandfather   . Hyperlipidemia Paternal Grandfather   . Heart attack Paternal Grandfather   . Hypertension Paternal Grandfather    Family Psychiatric  History: See admission H&P Social History:  Social History   Substance and Sexual Activity  Alcohol Use Not Currently   Comment: in rehab     Social History   Substance and Sexual Activity  Drug Use No   Comment: in rehab    Social History   Socioeconomic History  . Marital status: Legally Separated    Spouse name: Not on file  . Number of children: Not on file  . Years of education: Not on file  .  Highest education level: Not on file  Occupational History  . Occupation: disabled  Social Needs  . Financial resource strain: Not on file  . Food insecurity    Worry: Not on file    Inability: Not on file  . Transportation needs    Medical: Not on file    Non-medical: Not on file  Tobacco Use  . Smoking status: Current Every Day Smoker    Packs/day: 0.50  . Smokeless tobacco: Never Used  Substance and Sexual Activity  . Alcohol use: Not Currently    Comment: in rehab  . Drug use: No    Comment: in rehab  . Sexual activity: Yes    Birth control/protection: None  Lifestyle  . Physical activity    Days per week: Not on file    Minutes per session: Not  on file  . Stress: Not on file  Relationships  . Social Musicianconnections    Talks on phone: Not on file    Gets together: Not on file    Attends religious service: Not on file    Active member of club or organization: Not on file    Attends meetings of clubs or organizations: Not on file    Relationship status: Not on file  Other Topics Concern  . Not on file  Social History Narrative  . Not on file   Additional Social History:                         Sleep: Fair  Appetite:  Good  Current Medications: Current Facility-Administered Medications  Medication Dose Route Frequency Provider Last Rate Last Dose  . albuterol (VENTOLIN HFA) 108 (90 Base) MCG/ACT inhaler 2 puff  2 puff Inhalation Q6H PRN Catalina Gravelhomspon, Jacqueline, NP      . alum & mag hydroxide-simeth (MAALOX/MYLANTA) 200-200-20 MG/5ML suspension 30 mL  30 mL Oral Q4H PRN Thomspon, Adela LankJacqueline, NP      . atorvastatin (LIPITOR) tablet 40 mg  40 mg Oral Daily Thomspon, Adela LankJacqueline, NP   40 mg at 09/14/18 0759  . calcium-vitamin D (OSCAL WITH D) 500-200 MG-UNIT per tablet 1 tablet  1 tablet Oral Q breakfast Catalina Gravelhomspon, Jacqueline, NP   1 tablet at 09/14/18 0800  . cariprazine (VRAYLAR) capsule 3 mg  3 mg Oral Daily Thomspon, Adela LankJacqueline, NP   3 mg at 09/14/18 0800  . clonazePAM (KLONOPIN) tablet 1 mg  1 mg Oral TID PRN Clapacs, Jackquline DenmarkJohn T, MD   1 mg at 09/14/18 1003  . diphenoxylate-atropine (LOMOTIL) 2.5-0.025 MG per tablet 2 tablet  2 tablet Oral QID PRN Catalina Gravelhomspon, Jacqueline, NP      . FLUoxetine (PROZAC) capsule 80 mg  80 mg Oral Daily Thomspon, Adela LankJacqueline, NP   80 mg at 09/14/18 0800  . fluticasone (FLONASE) 50 MCG/ACT nasal spray 1 spray  1 spray Each Nare Daily Catalina Gravelhomspon, Jacqueline, NP   1 spray at 09/14/18 0759  . hydrOXYzine (ATARAX/VISTARIL) tablet 25 mg  25 mg Oral Q6H PRN Catalina Gravelhomspon, Jacqueline, NP      . ibuprofen (ADVIL) tablet 800 mg  800 mg Oral Q6H PRN Clapacs, John T, MD      . magnesium hydroxide (MILK OF MAGNESIA)  suspension 30 mL  30 mL Oral Daily PRN Catalina Gravelhomspon, Jacqueline, NP      . metFORMIN (GLUCOPHAGE) tablet 500 mg  500 mg Oral BID WC Thomspon, Adela LankJacqueline, NP   500 mg at 09/14/18 0759  . multivitamin with minerals tablet 1 tablet  1 tablet Oral Daily Catalina Gravelhomspon, Jacqueline, NP   1 tablet at 09/14/18 0759  . naproxen (NAPROSYN) tablet 250 mg  250 mg Oral BID BM & HS PRN Money, Gerlene Burdockravis B, FNP   250 mg at 09/13/18 1648  . nicotine (NICODERM CQ - dosed in mg/24 hours) patch 21 mg  21 mg Transdermal Daily Clapacs, John T, MD   21 mg at 09/14/18 0800  . Norgestimate-Ethinyl Estradiol Triphasic 0.18/0.215/0.25 MG-25 MCG tablet 1 tablet  1 tablet Oral Daily Clapacs, Jackquline DenmarkJohn T, MD   1 tablet at 09/13/18 1148  . pantoprazole (PROTONIX) EC tablet 40 mg  40 mg Oral Daily Catalina Gravelhomspon, Jacqueline, NP   40 mg at 09/14/18 0759  . pregabalin (LYRICA) capsule 50 mg  50 mg Oral TID Catalina Gravelhomspon, Jacqueline, NP   50 mg at 09/14/18 0759  . topiramate (TOPAMAX) tablet 200 mg  200 mg Oral QHS Thomspon, Adela LankJacqueline, NP   200 mg at 09/13/18 2153  . zolpidem (AMBIEN) tablet 5 mg  5 mg Oral QHS Clapacs, Jackquline DenmarkJohn T, MD   5 mg at 09/13/18 2153    Lab Results: No results found for this or any previous visit (from the past 48 hour(s)).  Blood Alcohol level:  Lab Results  Component Value Date   ETH <10 09/09/2018   ETH 162 (H) 06/03/2016    Metabolic Disorder Labs: Lab Results  Component Value Date   HGBA1C 5.2 09/27/2012   MPG 103 09/27/2012   No results found for: PROLACTIN No results found for: CHOL, TRIG, HDL, CHOLHDL, VLDL, LDLCALC  Physical Findings: AIMS: Facial and Oral Movements Muscles of Facial Expression: None, normal Lips and Perioral Area: None, normal Jaw: None, normal Tongue: None, normal,Extremity Movements Upper (arms, wrists, hands, fingers): None, normal Lower (legs, knees, ankles, toes): None, normal, Trunk Movements Neck, shoulders, hips: None, normal, Overall Severity Severity of abnormal movements (highest  score from questions above): None, normal Incapacitation due to abnormal movements: None, normal Patient's awareness of abnormal movements (rate only patient's report): No Awareness, Dental Status Current problems with teeth and/or dentures?: No Does patient usually wear dentures?: No  CIWA:  CIWA-Ar Total: 2 COWS:  COWS Total Score: 3  Musculoskeletal: Strength & Muscle Tone: within normal limits Gait & Station: normal Patient leans: N/A  Psychiatric Specialty Exam: Physical Exam  Nursing note and vitals reviewed. Constitutional: She is oriented to person, place, and time. She appears well-developed and well-nourished.  HENT:  Head: Normocephalic and atraumatic.  Respiratory: Effort normal.  Neurological: She is alert and oriented to person, place, and time.    ROS  Blood pressure 123/83, pulse 95, temperature 98.9 F (37.2 C), temperature source Oral, resp. rate 18, height 5\' 7"  (1.702 m), weight 92.1 kg, SpO2 96 %.Body mass index is 31.79 kg/m.  General Appearance: Casual  Eye Contact:  Fair  Speech:  Normal Rate  Volume:  Normal  Mood:  Euthymic  Affect:  Congruent  Thought Process:  Coherent and Descriptions of Associations: Intact  Orientation:  Full (Time, Place, and Person)  Thought Content:  Logical  Suicidal Thoughts:  No  Homicidal Thoughts:  No  Memory:  Immediate;   Fair Recent;   Fair Remote;   Fair  Judgement:  Intact  Insight:  Fair  Psychomotor Activity:  Normal  Concentration:  Concentration: Fair and Attention Span: Fair  Recall:  [employer]  Fund of Knowledge:  Fair  Language:  Good  Akathisia:  Negative  Handed:  Right  AIMS (if indicated):  Assets:  Desire for Improvement Resilience  ADL's:  Intact  Cognition:  WNL  Sleep:  Number of Hours: 7.25     Treatment Plan Summary: Daily contact with patient to assess and evaluate symptoms and progress in treatment, Medication management and Plan : Patient is seen and examined.  Patient is a  43 year old female with the above-stated past psychiatric history who is seen in follow-up.    Diagnosis: #1 bipolar disorder, most recently manic, severe without psychotic features, #2 polysubstance use disorder/dependence, #3 migraine headache, #4 diabetes mellitus type 2.  Patient appears to be slowly improving.  No change in her medications today.  Her headache is resolved.  She is not showing any signs or symptoms of withdrawal symptoms at this time.  Hopefully this will continue to improve and she will have mood stability. 1.  Continue Lipitor 40 mg p.o. daily for hyperlipidemia. 2.  Continue vitamin D supplementation for vitamin D deficiency. 3.  Continue Vraylar 3 mg p.o. daily for mood stability as well as psychosis. 4.  Continue clonazepam 1 mg p.o. 3 times daily as needed anxiety. 5.  Continue fluoxetine 80 mg p.o. daily for mood and anxiety. 6.  Continue hydroxyzine 25 mg p.o. every 6 hours as needed anxiety. 7.  Continue ibuprofen 800 mg p.o. every 6 hours as needed pain. 8.  Continue Naprosyn 250 mg p.o. 3 times daily as needed migraine headache. 9.  Continue Metformin 500 mg p.o. twice daily for diabetes mellitus type 2 10.  Check daily blood sugars to make sure sugar under control.  53.  Continue birth control pills. 12.  Continue Protonix 40 mg p.o. daily for GERD. 13.  Continue Lyrica 50 mg p.o. 3 times daily for fibromyalgia and pain. 14.  Continue Topamax 200 mg p.o. nightly for migraine headache prevention as well as mood stability. 15.  Continue Ambien 5 mg p.o. nightly as needed insomnia. 16.  Disposition planning-in progress Sharma Covert, MD 09/14/2018, 11:17 AM

## 2018-09-15 MED ORDER — ATORVASTATIN CALCIUM 40 MG PO TABS: 40.0000 mg | ORAL_TABLET | Freq: Every day | ORAL | 1 refills | Status: DC

## 2018-09-15 MED ORDER — ADULT MULTIVITAMIN W/MINERALS CH: 1.0000 | ORAL_TABLET | Freq: Every day | ORAL | 1 refills | Status: DC

## 2018-09-15 MED ORDER — FLUOXETINE HCL 40 MG PO CAPS: 80.0000 mg | ORAL_CAPSULE | Freq: Every day | ORAL | 1 refills | Status: DC

## 2018-09-15 MED ORDER — TOPIRAMATE 200 MG PO TABS: 200.0000 mg | ORAL_TABLET | Freq: Every day | ORAL | 1 refills | Status: DC

## 2018-09-15 MED ORDER — METFORMIN HCL 500 MG PO TABS: 500.0000 mg | ORAL_TABLET | Freq: Two times a day (BID) | ORAL | 1 refills | Status: DC

## 2018-09-15 MED ORDER — PANTOPRAZOLE SODIUM 40 MG PO TBEC: 40.0000 mg | DELAYED_RELEASE_TABLET | Freq: Every day | ORAL | 1 refills | Status: DC

## 2018-09-15 MED ORDER — ALBUTEROL SULFATE HFA 108 (90 BASE) MCG/ACT IN AERS: 2.0000 | INHALATION_SPRAY | Freq: Four times a day (QID) | RESPIRATORY_TRACT | 1 refills | Status: DC | PRN

## 2018-09-15 MED ORDER — CARIPRAZINE HCL 3 MG PO CAPS: 3.0000 mg | ORAL_CAPSULE | Freq: Every day | ORAL | 1 refills | Status: DC

## 2018-09-15 MED ORDER — CALCIUM CARBONATE-VITAMIN D 500-200 MG-UNIT PO TABS: 1.0000 | ORAL_TABLET | Freq: Every day | ORAL | 1 refills | Status: DC

## 2018-09-15 MED ORDER — FLUTICASONE PROPIONATE 50 MCG/ACT NA SUSP: 1.0000 | Freq: Every day | NASAL | 1 refills | Status: DC

## 2018-09-15 MED ORDER — FLUTICASONE PROPIONATE 50 MCG/ACT NA SUSP: 1.0000 | Freq: Every day | NASAL | 1 refills | Status: AC

## 2018-09-15 MED ORDER — NAPROXEN 250 MG PO TABS: 250.0000 mg | ORAL_TABLET | Freq: Two times a day (BID) | ORAL | 1 refills | Status: DC | PRN

## 2018-09-15 NOTE — BHH Suicide Risk Assessment (Signed)
[name] Discharge Suicide Risk Assessment   Principal Problem: Bipolar 1 disorder, manic, moderate ([name]) Discharge Diagnoses: Principal Problem:   Bipolar 1 disorder, manic, moderate ([name]) Active Problems:   Alcohol withdrawal ([place])   Alcohol abuse   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[place])   Amphetamine abuse ([name])   Diabetes ([name])   Total Time spent with patient: 45 minutes  Musculoskeletal: Strength & Muscle Tone: within normal limits Gait & Station: normal Patient leans: N/A  Psychiatric Specialty Exam: Review of Systems  Constitutional: Negative.   HENT: Negative.   Eyes: Negative.   Respiratory: Negative.   Cardiovascular: Negative.   Gastrointestinal: Negative.   Musculoskeletal: Negative.   Skin: Negative.   Neurological: Negative.   Psychiatric/Behavioral: Negative.     Blood pressure (!) 137/92, pulse (!) 108, temperature (!) 97.3 F (36.3 C), temperature source Oral, resp. rate 18, height 5\' 7"  (1.702 m), weight 92.1 kg, SpO2 98 %.Body mass index is 31.79 kg/m.  General Appearance: Casual  Eye Contact::  Fair  Speech:  Clear and [id number]  Volume:  Normal  Mood:  Euthymic  Affect:  Constricted  Thought Process:  Goal Directed  Orientation:  Full (Time, Place, and Person)  Thought Content:  Logical  Suicidal Thoughts:  No  Homicidal Thoughts:  No  Memory:  Immediate;   Fair Recent;   Fair Remote;   Fair  Judgement:  Fair  Insight:  Fair  Psychomotor Activity:  Normal  Concentration:  Fair  Recall:  [name]  Language: Fair  Akathisia:  No  Handed:  Right  AIMS (if indicated):     Assets:  Desire for Improvement Housing Resilience  Sleep:  Number of Hours: 6.5  Cognition: WNL  ADL's:  Intact   Mental Status Per Nursing Assessment::   On Admission:  NA  Demographic Factors:  NA  Loss Factors: Decline in physical health  Historical Factors: Impulsivity  Risk Reduction Factors:   Positive social  support and Positive therapeutic relationship  Continued Clinical Symptoms:  Bipolar Disorder:   Mixed State  Cognitive Features That Contribute To Risk:  Polarized thinking    Suicide Risk:  Minimal: No identifiable suicidal ideation.  Patients presenting with no risk factors but with morbid ruminations; may be classified as minimal risk based on the severity of the depressive symptoms  [name] Follow up on 10/02/2018.   Specialty: Behavioral Health Why: You have an appointment scheduled with Dr. Reece Levy on Thursday 10/02/18 at 1:50 PM. It will be a telephone session. Thank You! Contact information: [place] [address] [phone]        [name] Follow up.   Why: Please continue to follow up with your peer support as normal.  Contact information: [address] [place], [place] [address] Phone: [phone]  Fax: [phone]          Plan Of Care/Follow-up recommendations:  Activity:  Activity as tolerated Diet:  Regular diet Other:  Follow-up with outpatient treatment continue medication continue therapy continue every effort to stay sober.  Alethia Berthold, MD 09/15/2018, 9:26 AM

## 2018-09-15 NOTE — BHH Group Notes (Signed)
LCSW Group Therapy Note   09/15/2018 1:00 PM  Type of Therapy and Topic:  Group Therapy:  Overcoming Obstacles   Participation Level:  Did Not Attend   Description of Group:    In this group patients will be encouraged to explore what they see as obstacles to their own wellness and recovery. They will be guided to discuss their thoughts, feelings, and behaviors related to these obstacles. The group will process together ways to cope with barriers, with attention given to specific choices patients can make. Each patient will be challenged to identify changes they are motivated to make in order to overcome their obstacles. This group will be process-oriented, with patients participating in exploration of their own experiences as well as giving and receiving support and challenge from other group members.   Therapeutic Goals: 1. Patient will identify personal and current obstacles as they relate to admission. 2. Patient will identify barriers that currently interfere with their wellness or overcoming obstacles.  3. Patient will identify feelings, thought process and behaviors related to these barriers. 4. Patient will identify two changes they are willing to make to overcome these obstacles:      Summary of Patient Progress X   Therapeutic Modalities:   Cognitive Behavioral Therapy Solution Focused Therapy Motivational Interviewing Relapse Prevention Therapy  Assunta Curtis, MSW, LCSW 09/15/2018 12:01 PM

## 2018-09-15 NOTE — Discharge Summary (Signed)
Physician Discharge Summary Note  Patient:  [name] is an 43 y.o., female MRN:  [id number] DOB:  06-Feb-1976 Patient phone:  [phone] (home)  Patient address:   [address] [place] [id number],  Total Time spent with patient: 45 minutes  Date of Admission:  09/10/2018 Date of Discharge: September 15, 2018  Reason for Admission: Admitted through the emergency room because of depression and suicidal ideation and intoxication  Principal Problem: Bipolar 1 disorder, manic, moderate ([name]) Discharge Diagnoses: Principal Problem:   Bipolar 1 disorder, manic, moderate ([name]) Active Problems:   Alcohol withdrawal ([name])   Alcohol abuse   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[name])   Amphetamine abuse ([name])   Diabetes ([name])   Past Psychiatric History: Past history of recurrent severe depression and multiple substance abuse  Past Medical History:  Past Medical History:  Diagnosis Date  . Alcohol abuse   . Anxiety   . Anxiety   . Anxiety and depression   . Asthma   . Bipolar disorder ([name])   . Borderline personality disorder ([name])   . Depression   . Diabetes ([name])    Type 2   . Dyslipidemia   . Esophageal varices ([name])   . Foot drop    right  . 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[name])   . GERD (gastroesophageal reflux disease)   . Gout   . History of drug abuse ([name])   . Insomnia   . Muscular dystrophies ([name])   . Neuropathy   . Pancreatitis   . Recovering alcoholic [facility]([name])     Past Surgical History:  Procedure Laterality Date  . HERNIA REPAIR     Family History:  Family History  Problem Relation Age of Onset  . Heart disease Mother   . Heart attack Mother   . Hyperlipidemia Maternal Grandmother   . Diabetes Maternal Grandmother   . Hypertension Maternal Grandmother   . Hyperlipidemia Maternal Grandfather   . Heart disease Maternal Grandfather   . Heart attack Maternal Grandfather   . Diabetes Maternal Grandfather   . Lung cancer  Maternal Grandfather   . Hypertension Maternal Grandfather   . Other Paternal Grandmother        Thyroid problems   . Heart disease Paternal Grandfather   . Stroke Paternal Grandfather   . Hyperlipidemia Paternal Grandfather   . Heart attack Paternal Grandfather   . Hypertension Paternal Grandfather    Family Psychiatric  History: See previous. Social History:  Social History   Substance and Sexual Activity  Alcohol Use Not Currently   Comment: in rehab     Social History   Substance and Sexual Activity  Drug Use No   Comment: in rehab    Social History   Socioeconomic History  . Marital status: Legally Separated    Spouse name: Not on file  . Number of children: Not on file  . Years of education: Not on file  . Highest education level: Not on file  Occupational History  . Occupation: disabled  Social Needs  . Financial resource strain: Not on file  . Food insecurity    Worry: Not on file    Inability: Not on file  . Transportation needs    Medical: Not on file    Non-medical: Not on file  Tobacco Use  . Smoking status: Current Every Day Smoker    Packs/day: 0.50  . Smokeless tobacco: Never Used  Substance and Sexual Activity  . Alcohol use: Not Currently  Comment: in rehab  . Drug use: No    Comment: in rehab  . Sexual activity: Yes    Birth control/protection: None  Lifestyle  . Physical activity    Days per week: Not on file    Minutes per session: Not on file  . Stress: Not on file  Relationships  . Social Musicianconnections    Talks on phone: Not on file    Gets together: Not on file    Attends religious service: Not on file    Active member of club or organization: Not on file    Attends meetings of clubs or organizations: Not on file    Relationship status: Not on file  Other Topics Concern  . Not on file  Social History Narrative  . Not on file    Hospital Course: Patient admitted to the psychiatric ward.  Calm and appropriate with full  work-up.  Did not display any dangerous behavior although initially endorsed some suicidal thoughts.  We worked on some medication management although ultimately made little change from her baseline medicine just getting her restarted.  She detoxed without incident.  Participated in groups appropriately.  At the time of discharge patient was calm and lucid with no sign of acute dangerousness.  Referred to outpatient treatment as she had previously been doing.  Physical Findings: AIMS: Facial and Oral Movements Muscles of Facial Expression: None, normal Lips and Perioral Area: None, normal Jaw: None, normal Tongue: None, normal,Extremity Movements Upper (arms, wrists, hands, fingers): None, normal Lower (legs, knees, ankles, toes): None, normal, Trunk Movements Neck, shoulders, hips: None, normal, Overall Severity Severity of abnormal movements (highest score from questions above): None, normal Incapacitation due to abnormal movements: None, normal Patient's awareness of abnormal movements (rate only patient's report): No Awareness, Dental Status Current problems with teeth and/or dentures?: No Does patient usually wear dentures?: No  CIWA:  CIWA-Ar Total: 2 COWS:  COWS Total Score: 3  Musculoskeletal: Strength & Muscle Tone: within normal limits Gait & Station: normal Patient leans: N/A  Psychiatric Specialty Exam: Physical Exam  Nursing note and vitals reviewed. Constitutional: She appears well-developed and well-nourished.  HENT:  Head: Normocephalic and atraumatic.  Eyes: Pupils are equal, round, and reactive to light. Conjunctivae are normal.  Neck: Normal range of motion.  Cardiovascular: Regular rhythm and normal heart sounds.  Respiratory: Effort normal.  GI: Soft.  Musculoskeletal: Normal range of motion.  Neurological: She is alert.  Skin: Skin is warm and dry.  Psychiatric: She has a normal mood and affect. Her speech is normal and behavior is normal. Judgment and  thought content normal. Cognition and memory are normal.    Review of Systems  Constitutional: Negative.   HENT: Negative.   Eyes: Negative.   Respiratory: Negative.   Cardiovascular: Negative.   Gastrointestinal: Negative.   Musculoskeletal: Negative.   Skin: Negative.   Neurological: Negative.   Psychiatric/Behavioral: Negative.     Blood pressure (!) 137/92, pulse (!) 108, temperature (!) 97.3 F (36.3 C), temperature source Oral, resp. rate 18, height 5\' 7"  (1.702 m), weight 92.1 kg, SpO2 98 %.Body mass index is 31.79 kg/m.  General Appearance: Casual  Eye Contact:  Good  Speech:  Clear and Coherent  Volume:  Normal  Mood:  Euthymic  Affect:  Congruent  Thought Process:  Goal Directed  Orientation:  Full (Time, Place, and Person)  Thought Content:  Logical  Suicidal Thoughts:  No  Homicidal Thoughts:  No  Memory:  Immediate;  Fair Recent;   Fair Remote;   Fair  Judgement:  Fair  Insight:  Fair  Psychomotor Activity:  Normal  Concentration:  Concentration: Fair  Recall:  FiservFair  Fund of Knowledge:  Fair  Language:  Fair  Akathisia:  No  Handed:  Right  AIMS (if indicated):     Assets:  Desire for Improvement Housing Physical Health Social Support  ADL's:  Intact  Cognition:  WNL  Sleep:  Number of Hours: 6.5     Have you used any form of tobacco in the last 30 days? (Cigarettes, Smokeless Tobacco, Cigars, and/or Pipes): Yes  Has this patient used any form of tobacco in the last 30 days? (Cigarettes, Smokeless Tobacco, Cigars, and/or Pipes) Yes, Yes, A prescription for an FDA-approved tobacco cessation medication was offered at discharge and the patient refused  Blood Alcohol level:  Lab Results  Component Value Date   ETH <10 09/09/2018   ETH 162 (H) 06/03/2016    Metabolic Disorder Labs:  Lab Results  Component Value Date   HGBA1C 5.2 09/27/2012   MPG 103 09/27/2012   No results found for: PROLACTIN No results found for: CHOL, TRIG, HDL, CHOLHDL,  VLDL, LDLCALC  See Psychiatric Specialty Exam and Suicide Risk Assessment completed by Attending Physician prior to discharge.  Discharge destination:  Home  Is patient on multiple antipsychotic therapies at discharge:  No   Has Patient had three or more failed trials of antipsychotic monotherapy by history:  No  Recommended Plan for Multiple Antipsychotic Therapies: NA  Discharge Instructions    Diet - low sodium heart healthy   Complete by: As directed    Increase activity slowly   Complete by: As directed      Allergies as of 09/15/2018      Reactions   Erythromycin Anaphylaxis   Sulfa Antibiotics Anaphylaxis   Tylenol [acetaminophen] Other (See Comments)   Pancreatitis and liver dysfunction, not supposed to take med   Nsaids Other (See Comments)   Causes GI problems, very bad reaction-per patient.    Gabapentin    Mood changes       Medication List    STOP taking these medications   ARIPiprazole 20 MG tablet Commonly known as: ABILIFY   diphenoxylate-atropine 2.5-0.025 MG tablet Commonly known as: LOMOTIL   esomeprazole 40 MG capsule Commonly known as: NEXIUM   methocarbamol 500 MG tablet Commonly known as: ROBAXIN   multivitamin capsule   PHENTERMINE HCL PO     TAKE these medications     Indication  albuterol 108 (90 Base) MCG/ACT inhaler Commonly known as: VENTOLIN HFA Inhale 2 puffs into the lungs every 6 (six) hours as needed for wheezing.  Indication: Asthma   atorvastatin 40 MG tablet Commonly known as: LIPITOR Take 1 tablet (40 mg total) by mouth daily.  Indication: High Amount of Fats in the Blood   calcium-vitamin D 500-200 MG-UNIT tablet Commonly known as: OSCAL WITH D Take 1 tablet by mouth daily with breakfast. Start taking on: September 16, 2018 What changed: when to take this  Indication: Low Amount of Calcium in the Blood   cariprazine capsule Commonly known as: Vraylar Take 1 capsule (3 mg total) by mouth daily.  Indication: Major  Depressive Disorder   clonazePAM 1 MG tablet Commonly known as: KLONOPIN Take 1 mg by mouth 3 (three) times daily as needed for anxiety.  Indication: Manic-Depression   FLUoxetine 40 MG capsule Commonly known as: PROZAC Take 2 capsules (80 mg total) by  mouth daily.  Indication: Depression   fluticasone 50 MCG/ACT nasal spray Commonly known as: FLONASE Place 1 spray into both nostrils daily. Start taking on: September 16, 2018 What changed: how much to take  Indication: Allergic Rhinitis   Lyrica 50 MG capsule Generic drug: pregabalin Take 50 mg by mouth 3 (three) times daily.  Indication: Fibromyalgia Syndrome   metFORMIN 500 MG tablet Commonly known as: GLUCOPHAGE Take 1 tablet (500 mg total) by mouth 2 (two) times daily with a meal.  Indication: Type 2 Diabetes   multivitamin with minerals Tabs tablet Take 1 tablet by mouth daily. Start taking on: September 16, 2018  Indication: Vitamin Deficiency   naproxen 250 MG tablet Commonly known as: NAPROSYN Take 1 tablet (250 mg total) by mouth 3 times/day as needed-between meals & bedtime for mild pain.  Indication: Joint Damage causing Pain and Loss of Function   pantoprazole 40 MG tablet Commonly known as: PROTONIX Take 1 tablet (40 mg total) by mouth daily. Start taking on: September 16, 2018  Indication: Gastroesophageal Reflux Disease   topiramate 200 MG tablet Commonly known as: TOPAMAX Take 1 tablet (200 mg total) by mouth at bedtime.  Indication: Migraine Headache   Tri-Sprintec 0.18/0.215/0.25 MG-35 MCG tablet Generic drug: Norgestimate-Ethinyl Estradiol Triphasic Take 1 tablet by mouth daily.  Indication: Birth Control Treatment   zolpidem 10 MG tablet Commonly known as: AMBIEN Take 10 mg by mouth at bedtime as needed for sleep.  Indication: [place], [name] Follow up on 10/02/2018.   Specialty: Behavioral Health Why: You have an appointment  scheduled with Dr. Reece Levy on Thursday 10/02/18 at 1:50 PM. It will be a telephone session. Thank You! Contact information: [place] [address] [phone]        [facility] Follow up.   Why: Please continue to follow up with your peer support as normal.  Contact information: [address] [place], [place] [address] Phone: [phone]  Fax: [phone]          Follow-up recommendations:  Activity:  Activity as tolerated Diet:  Regular diet Other:  [facility]  Comments: Prescriptions given for medicines other than her controlled substances at discharge.  Signed: Alethia Berthold, MD 09/15/2018, 7:22 PM

## 2018-09-15 NOTE — Progress Notes (Signed)
Recreation Therapy Notes   Date: 09/15/2018  Time: 9:30 am   Location: Craft room   Behavioral response: N/A   Intervention Topic: Stress  Discussion/Intervention: Patient did not attend group.   Clinical Observations/Feedback:  Patient did not attend group.   Suzie Vandam LRT/CTRS        Markitta Ausburn 09/15/2018 10:52 AM

## 2018-09-15 NOTE — Tx Team (Signed)
Interdisciplinary Treatment and Diagnostic Plan Update  09/15/2018 Time of Session: 830AM [name] MRN: [id number]  Principal Diagnosis: Bipolar 1 disorder, manic, moderate ([name])  Secondary Diagnoses: Principal Problem:   Bipolar 1 disorder, manic, moderate ([place]) Active Problems:   Alcohol withdrawal ([place])   Alcohol abuse   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[place])   Amphetamine abuse ([place])   Diabetes ([place])   Current Medications:  Current Facility-Administered Medications  Medication Dose Route Frequency Provider Last Rate Last Dose  . albuterol (VENTOLIN HFA) 108 (90 Base) MCG/ACT inhaler 2 puff  2 puff Inhalation Q6H PRN Lamont Dowdy, NP      . alum & mag hydroxide-simeth (MAALOX/MYLANTA) 200-200-20 MG/5ML suspension 30 mL  30 mL Oral Q4H PRN Thomspon, Geni Bers, NP      . atorvastatin (LIPITOR) tablet 40 mg  40 mg Oral Daily Thomspon, Geni Bers, NP   40 mg at 09/15/18 0751  . calcium-vitamin D (OSCAL WITH D) 500-200 MG-UNIT per tablet 1 tablet  1 tablet Oral Q breakfast Lamont Dowdy, NP   1 tablet at 09/15/18 0752  . cariprazine (VRAYLAR) capsule 3 mg  3 mg Oral Daily Lamont Dowdy, NP   3 mg at 09/15/18 0752  . clonazePAM (KLONOPIN) tablet 1 mg  1 mg Oral TID PRN Clapacs, Madie Reno, MD   1 mg at 09/15/18 0759  . diphenoxylate-atropine (LOMOTIL) 2.5-0.025 MG per tablet 2 tablet  2 tablet Oral QID PRN Lamont Dowdy, NP      . FLUoxetine (PROZAC) capsule 80 mg  80 mg Oral Daily Lamont Dowdy, NP   80 mg at 09/15/18 0751  . fluticasone (FLONASE) 50 MCG/ACT nasal spray 1 spray  1 spray Each Nare Daily Thomspon, Geni Bers, NP   1 spray at 09/14/18 0759  . hydrOXYzine (ATARAX/VISTARIL) tablet 25 mg  25 mg Oral Q6H PRN Lamont Dowdy, NP      . ibuprofen (ADVIL) tablet 800 mg  800 mg Oral Q6H PRN Clapacs, Madie Reno, MD   800 mg at 09/15/18 0346  . magnesium hydroxide (MILK OF MAGNESIA) suspension 30 mL  30 mL Oral Daily PRN Lamont Dowdy, NP      . metFORMIN (GLUCOPHAGE) tablet 500 mg  500 mg Oral BID WC Thomspon, Geni Bers, NP   500 mg at 09/15/18 0751  . multivitamin with minerals tablet 1 tablet  1 tablet Oral Daily Lamont Dowdy, NP   1 tablet at 09/15/18 0751  . naproxen (NAPROSYN) tablet 250 mg  250 mg Oral BID BM & HS PRN Money, Lowry Ram, FNP   250 mg at 09/13/18 1648  . nicotine (NICODERM CQ - dosed in mg/24 hours) patch 21 mg  21 mg Transdermal Daily Clapacs, Madie Reno, MD   21 mg at 09/15/18 0751  . Norgestimate-Ethinyl Estradiol Triphasic 0.18/0.215/0.25 MG-25 MCG tablet 1 tablet  1 tablet Oral Daily Clapacs, Madie Reno, MD   1 tablet at 09/14/18 1148  . pantoprazole (PROTONIX) EC tablet 40 mg  40 mg Oral Daily Lamont Dowdy, NP   40 mg at 09/15/18 0751  . pregabalin (LYRICA) capsule 50 mg  50 mg Oral TID Lamont Dowdy, NP   50 mg at 09/15/18 0751  . topiramate (TOPAMAX) tablet 200 mg  200 mg Oral QHS Thomspon, Geni Bers, NP   200 mg at 09/14/18 2132  . zolpidem (AMBIEN) tablet 5 mg  5 mg Oral QHS Clapacs, Madie Reno, MD   5 mg at 09/14/18 2132   PTA Medications: Medications Prior to Admission  Medication Sig  Dispense Refill Last Dose  . albuterol (PROVENTIL HFA;VENTOLIN HFA) 108 (90 BASE) MCG/ACT inhaler Inhale 2 puffs into the lungs every 6 (six) hours as needed for wheezing.     . ARIPiprazole (ABILIFY) 20 MG tablet Take 20 mg by mouth daily.     [place]. atorvastatin (LIPITOR) 40 MG tablet Take 40 mg by mouth daily.     . calcium-vitamin D (OSCAL WITH D) 500-200 MG-UNIT tablet Take 1 tablet by mouth.     . cariprazine (VRAYLAR) capsule Take 3 mg by mouth daily.     . clonazePAM (KLONOPIN) 1 MG tablet Take 1 mg by mouth 3 (three) times daily as needed for anxiety.     . diphenoxylate-atropine (LOMOTIL) 2.5-0.025 MG tablet Take by mouth 4 (four) times daily as needed for diarrhea or loose stools.     [place] Kitchen. esomeprazole (NEXIUM) 40 MG capsule Take 40 mg by mouth daily at 12 noon.     [place] Kitchen. FLUoxetine  (PROZAC) 40 MG capsule Take 80 mg by mouth daily.     . fluticasone (FLONASE) 50 MCG/ACT nasal spray Place into both nostrils daily.     [place] Kitchen. LYRICA 50 MG capsule Take 50 mg by mouth 3 (three) times daily.     . metFORMIN (GLUCOPHAGE) 500 MG tablet Take 500 mg by mouth 2 (two) times daily with a meal.     . methocarbamol (ROBAXIN) 500 MG tablet Take 500 mg by mouth 2 (two) times daily as needed.      . Multiple Vitamin (MULTIVITAMIN) capsule Take 1 capsule by mouth daily.     . Norgestimate-Ethinyl Estradiol Triphasic (TRI-SPRINTEC) 0.18/0.215/0.25 MG-35 MCG tablet Take 1 tablet by mouth daily.     [place] Kitchen. PHENTERMINE HCL PO Take by mouth.     . topiramate (TOPAMAX) 200 MG tablet Take 200 mg by mouth at bedtime.     [place] Kitchen. zolpidem (AMBIEN) 10 MG tablet Take 10 mg by mouth at bedtime as needed for sleep.       Patient Stressors: Financial difficulties Medication change or noncompliance Substance abuse  Patient Strengths: Capable of independent living Barrister's clerkCommunication skills Motivation for treatment/growth  Treatment Modalities: Medication Management, Group therapy, Case management,  1 to 1 session with clinician, Psychoeducation, Recreational therapy.   Physician Treatment Plan for Primary Diagnosis: Bipolar 1 disorder, manic, moderate ([name]) Long Term Goal(s): Improvement in symptoms so as ready for discharge Improvement in symptoms so as ready for discharge   Short Term Goals: Ability to verbalize feelings will improve Ability to disclose and discuss suicidal ideas Ability to demonstrate self-control will improve Compliance with prescribed medications will improve Ability to identify triggers associated with substance abuse/mental health issues will improve  Medication Management: Evaluate patient's response, side effects, and tolerance of medication regimen.  Therapeutic Interventions: 1 to 1 sessions, Unit Group sessions and Medication administration.  Evaluation of Outcomes: Adequate for  Discharge  Physician Treatment Plan for Secondary Diagnosis: Principal Problem:   Bipolar 1 disorder, manic, moderate ([name]) Active Problems:   Alcohol withdrawal ([name])   Alcohol abuse   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[name])   Amphetamine abuse ([name])   Diabetes ([name])  Long Term Goal(s): Improvement in symptoms so as ready for discharge Improvement in symptoms so as ready for discharge   Short Term Goals: Ability to verbalize feelings will improve Ability to disclose and discuss suicidal ideas Ability to demonstrate self-control will improve Compliance with prescribed medications will improve Ability to identify triggers associated with substance abuse/mental health issues will improve  Medication Management: Evaluate patient's response, side effects, and tolerance of medication regimen.  Therapeutic Interventions: 1 to 1 sessions, Unit Group sessions and Medication administration.  Evaluation of Outcomes: Adequate for Discharge   RN Treatment Plan for Primary Diagnosis: Bipolar 1 disorder, manic, moderate ([name]) Long Term Goal(s): Knowledge of disease and therapeutic regimen to maintain health will improve  Short Term Goals: Ability to participate in decision making will improve, Ability to verbalize feelings will improve and Ability to identify and develop effective coping behaviors will improve  Medication Management: RN will administer medications as ordered by provider, will assess and evaluate patient's response and provide education to patient for prescribed medication. RN will report any adverse and/or side effects to prescribing provider.  Therapeutic Interventions: 1 on 1 counseling sessions, Psychoeducation, Medication administration, Evaluate responses to treatment, Monitor vital signs and CBGs as ordered, Perform/monitor CIWA, COWS, AIMS and Fall Risk screenings as ordered, Perform wound care treatments as ordered.  Evaluation of Outcomes: Adequate for  Discharge   LCSW Treatment Plan for Primary Diagnosis: Bipolar 1 disorder, manic, moderate ([name]) Long Term Goal(s): Safe transition to appropriate next level of care at discharge, Engage patient in therapeutic group addressing interpersonal concerns.  Short Term Goals: Engage patient in aftercare planning with referrals and resources and Increase skills for wellness and recovery  Therapeutic Interventions: Assess for all discharge needs, 1 to 1 time with Social worker, Explore available resources and support systems, Assess for adequacy in community support network, Educate family and significant other(s) on suicide prevention, Complete Psychosocial Assessment, Interpersonal group therapy.  Evaluation of Outcomes: Adequate for Discharge   Progress in Treatment: Attending groups: No. Participating in groups: No. Taking medication as prescribed: Yes. Toleration medication: Yes. Family/Significant other contact made: Yes, individual(s) contacted:  Bosie Closawn Sears, cousin Patient understands diagnosis: Yes. Discussing patient identified problems/goals with staff: Yes. Medical problems stabilized or resolved: Yes. Denies suicidal/homicidal ideation: Yes. Issues/concerns per patient self-inventory: No. Other: NA  New problem(s) identified: No, Describe:  none reported  New Short Term/Long Term Goal(s):Attend outpatient treatment, take medication as prescribed and develop and implement healthy coping methods  Patient Goals:  "Make better decisions and go to group"  Discharge Plan or Barriers: SPE pamphlet, Mobile Crisis information, and AA/NA information provided to patient for additional community support and resources. Pt has an appointment with [facility] on 10/02/18 at 1:50 PM and [facility].  Reason for Continuation of Hospitalization: Depression Mania Medication stabilization  Estimated Length of Stay: Today  09/15/18   Recreational Therapy: Patient Stressors: Room mate  Patient Goal: Patient will successfully identify 2 ways of making healthy decisions post d/c within 5 recreation therapy group sessions  Attendees: Patient:[name] 09/15/2018 9:12 AM  Physician:  09/15/2018 9:12 AM  Nursing:  09/15/2018 9:12 AM  RN Care Manager: 09/15/2018 9:12 AM  Social Worker: Calven Gilkes LCSW 09/15/2018 9:12 AM  Recreational Therapist: 09/15/2018 9:12 AM  Other:  09/15/2018 9:12 AM  Other:  09/15/2018 9:12 AM  Other: 09/15/2018 9:12 AM    Scribe for Treatment Team: Charlann Langelivia K Monserath Neff, LCSW 09/15/2018 9:12 AM

## 2018-09-15 NOTE — Progress Notes (Signed)
Patient denies SI/HI, denies A/V hallucinations. Patient verbalizes understanding of discharge instructions, follow up care and prescriptions. Patient given all belongings from [facility] locker. Patient escorted out by staff, transported by family. 

## 2018-09-15 NOTE — Progress Notes (Signed)
Recreation Therapy Notes  INPATIENT RECREATION TR PLAN  Patient Details Name: [name] MRN: [id number] DOB: 01-30-1976 Today's Date: 09/15/2018  Rec Therapy Plan Is patient appropriate for Therapeutic Recreation?: Yes Treatment times per week: At least 3 Estimated Length of Stay: 5-7 days TR Treatment/Interventions: Group participation (Comment)  Discharge Criteria Pt will be discharged from therapy if:: Discharged Treatment plan/goals/alternatives discussed and agreed upon by:: Patient/family  Discharge Summary Short term goals set: Patient will successfully identify 2 ways of making healthy decisions post d/c within 5 recreation therapy group sessions Short term goals met: Adequate for discharge Progress toward goals comments: Groups attended Which groups?: Coping skills(Time Management) Reason goals not met: N/A Therapeutic equipment acquired: N/A Reason patient discharged from therapy: Discharge from hospital Pt/family agrees with progress & goals achieved: Yes Date patient discharged from therapy: 09/15/18   Yama Nielson 09/15/2018, 11:29 AM

## 2018-09-15 NOTE — Progress Notes (Signed)
  [facility] Adult Case Management Discharge Plan :  Will you be returning to the same living situation after discharge:  Yes,  home At discharge, do you have transportation home?: Yes,  will call her cousin Do you have the ability to pay for your medications: Yes,  [facility] Medicare  Release of information consent forms completed and in the chart;    Patient to Follow up at: [place], [name] Follow up on 10/02/2018.   Specialty: Behavioral Health Why: You have an appointment scheduled with Dr. Reece Levy on Thursday 10/02/18 at 1:50 PM. It will be a telephone session. Thank You! Contact information: [place] [phone]        [facility] Follow up.   Why: Please continue to follow up with your peer support as normal.  Contact information: [address], [place] [address] Phone: [phone]  Fax: [phone]          Next level of care provider has access to [place] and Suicide Prevention discussed: Yes,  SPE completed with pts cousin  Have you used any form of tobacco in the last 30 days? (Cigarettes, Smokeless Tobacco, Cigars, and/or Pipes): Yes  Has patient been referred to the Quitline?: Patient refused referral  Patient has been referred for addiction treatment: [place], LCSW 09/15/2018, 9:07 AM

## 2018-09-26 ENCOUNTER — Ambulatory Visit: Payer: Medicare Other | Admitting: Neurology

## 2018-09-29 ENCOUNTER — Ambulatory Visit: Payer: Medicare Other | Admitting: Neurology

## 2018-10-10 ENCOUNTER — Other Ambulatory Visit: Payer: Self-pay

## 2018-10-10 NOTE — Patient Outreach (Signed)
[facility]) Care Management  10/10/2018  [name] [birth year] [id number]   Medication Adherence call to [name] [place] Resources Identifiers Verify spoke with patient she is past due on Metformin Er 500 mg patient explain she has been going thru some medical issues and did not take this medication on a regular basis but now she is taking it again patient ask if we can call [facility] an [facility]. Mrs. Ilagan is showing past due under [place].   [place] Management Direct Dial [phone]  Fax [phone] [email or web address]

## 2018-10-14 DIAGNOSIS — K580 Irritable bowel syndrome with diarrhea: Secondary | ICD-10-CM | POA: Diagnosis not present

## 2018-10-29 ENCOUNTER — Other Ambulatory Visit: Payer: Self-pay

## 2018-10-29 NOTE — Patient Outreach (Signed)
[facility]) Care Management  10/29/2018  MAKYLEE SANBORN [birth year] [id number]   Medication Adherence call to Mrs. [place] Compliant Voice message left with a call back number. Mrs. Carrell is showing past due under [place].   [place] Management Direct Dial [phone]  Fax [phone] [email or web address]

## 2018-10-31 DIAGNOSIS — Z1231 Encounter for screening mammogram for malignant neoplasm of breast: Secondary | ICD-10-CM | POA: Diagnosis not present

## 2018-10-31 DIAGNOSIS — Z9181 History of falling: Secondary | ICD-10-CM | POA: Diagnosis not present

## 2018-10-31 DIAGNOSIS — E785 Hyperlipidemia, unspecified: Secondary | ICD-10-CM | POA: Diagnosis not present

## 2018-10-31 DIAGNOSIS — Z0000 Encounter for general adult medical examination without abnormal findings: Secondary | ICD-10-CM | POA: Diagnosis not present

## 2018-12-02 ENCOUNTER — Other Ambulatory Visit: Payer: Self-pay

## 2018-12-02 NOTE — Patient Outreach (Signed)
[facility]) Care Management  12/02/2018  SHARISA TOVES 1975-07-29 [id number]   Medication Adherence call to Mr. Samara Stankowski Telephone call to Patient regarding Medication Adherence unable to reach patient. Mrs. Gregg is showing past due on Atorvastatin 40 mg under [place].   [place] Management Direct Dial [phone]  Fax [phone] [email or web address]

## 2019-01-12 ENCOUNTER — Other Ambulatory Visit: Payer: Self-pay

## 2019-01-12 NOTE — Patient Outreach (Signed)
[facility]) Care Management  01/12/2019  [name] 1976/02/20 [id number]   Medication Adherence call to [name] Telephone call to Patient regarding Medication Adherence unable to reach patient voice mail not set up. [name] is showing past due on Metformin Er 500 mg under [place].   [place] Management Direct Dial [phone]  Fax [phone] [name]

## 2019-01-22 ENCOUNTER — Other Ambulatory Visit: Payer: Self-pay

## 2019-01-22 ENCOUNTER — Encounter: Payer: Self-pay | Admitting: Emergency Medicine

## 2019-01-22 ENCOUNTER — Emergency Department
Admission: EM | Admit: 2019-01-22 | Discharge: 2019-01-22 | Disposition: A | Discharge status: Home / Self Care | Payer: Medicare Other | Attending: Emergency Medicine | Admitting: Emergency Medicine

## 2019-01-22 DIAGNOSIS — R739 Hyperglycemia, unspecified: Secondary | ICD-10-CM

## 2019-01-22 DIAGNOSIS — T40601A Poisoning by unspecified narcotics, accidental (unintentional), initial encounter: Secondary | ICD-10-CM

## 2019-01-22 DIAGNOSIS — F329 Major depressive disorder, single episode, unspecified: Secondary | ICD-10-CM | POA: Diagnosis present

## 2019-01-22 DIAGNOSIS — J45909 Unspecified asthma, uncomplicated: Secondary | ICD-10-CM | POA: Insufficient documentation

## 2019-01-22 DIAGNOSIS — R000 Tachycardia, unspecified: Secondary | ICD-10-CM | POA: Diagnosis not present

## 2019-01-22 DIAGNOSIS — R0902 Hypoxemia: Secondary | ICD-10-CM | POA: Diagnosis not present

## 2019-01-22 DIAGNOSIS — F17200 Nicotine dependence, unspecified, uncomplicated: Secondary | ICD-10-CM | POA: Insufficient documentation

## 2019-01-22 DIAGNOSIS — Z046 Encounter for general psychiatric examination, requested by authority: Secondary | ICD-10-CM | POA: Insufficient documentation

## 2019-01-22 DIAGNOSIS — Z7984 Long term (current) use of oral hypoglycemic drugs: Secondary | ICD-10-CM | POA: Diagnosis not present

## 2019-01-22 DIAGNOSIS — I10 Essential (primary) hypertension: Secondary | ICD-10-CM | POA: Diagnosis not present

## 2019-01-22 DIAGNOSIS — E1165 Type 2 diabetes mellitus with hyperglycemia: Secondary | ICD-10-CM | POA: Diagnosis not present

## 2019-01-22 DIAGNOSIS — T887XXA Unspecified adverse effect of drug or medicament, initial encounter: Secondary | ICD-10-CM | POA: Diagnosis not present

## 2019-01-22 DIAGNOSIS — F419 Anxiety disorder, unspecified: Secondary | ICD-10-CM | POA: Diagnosis not present

## 2019-01-22 DIAGNOSIS — Z79899 Other long term (current) drug therapy: Secondary | ICD-10-CM | POA: Diagnosis not present

## 2019-01-22 DIAGNOSIS — F11959 Opioid use, unspecified with opioid-induced psychotic disorder, unspecified: Secondary | ICD-10-CM | POA: Insufficient documentation

## 2019-01-22 DIAGNOSIS — F32A Depression, unspecified: Secondary | ICD-10-CM

## 2019-01-22 DIAGNOSIS — Z743 Need for continuous supervision: Secondary | ICD-10-CM | POA: Diagnosis not present

## 2019-01-22 DIAGNOSIS — T402X1A Poisoning by other opioids, accidental (unintentional), initial encounter: Secondary | ICD-10-CM | POA: Diagnosis not present

## 2019-01-22 LAB — ACETAMINOPHEN LEVEL: Acetaminophen (Tylenol), Serum: <10 ug/mL — ABNORMAL LOW (ref 10–30)

## 2019-01-22 LAB — COMPREHENSIVE METABOLIC PANEL
ALT: 12 U/L (ref 0–44)
AST: 37 U/L (ref 15–41)
Albumin: 3.8 g/dL (ref 3.5–5.0)
Alkaline Phosphatase: 93 U/L (ref 38–126)
Anion gap: 17 — ABNORMAL HIGH (ref 5–15)
BUN: 6 mg/dL (ref 6–20)
CO2: 23 mmol/L (ref 22–32)
Calcium: 9.4 mg/dL (ref 8.9–10.3)
Chloride: 97 mmol/L — ABNORMAL LOW (ref 98–111)
Creatinine, Ser: 0.68 mg/dL (ref 0.44–1.00)
GFR calc Af Amer: >60 mL/min (ref >60)
GFR calc non Af Amer: >60 mL/min (ref >60)
Glucose, Bld: 488 mg/dL — ABNORMAL HIGH (ref 70–99)
Potassium: 3.7 mmol/L (ref 3.5–5.1)
Sodium: 137 mmol/L (ref 135–145)
Total Bilirubin: 0.4 mg/dL (ref 0.3–1.2)
Total Protein: 7.9 g/dL (ref 6.5–8.1)

## 2019-01-22 LAB — CBC WITH DIFFERENTIAL/PLATELET
Abs Immature Granulocytes: 0.04 10*3/uL (ref 0.00–0.07)
Basophils Absolute: 0.1 10*3/uL (ref 0.0–0.1)
Basophils Relative: 1 %
Eosinophils Absolute: 0 10*3/uL (ref 0.0–0.5)
Eosinophils Relative: 0 %
HCT: 41.7 % (ref 36.0–46.0)
Hemoglobin: 13.5 g/dL (ref 12.0–15.0)
Immature Granulocytes: 0 %
Lymphocytes Relative: 28 %
Lymphs Abs: 3 10*3/uL (ref 0.7–4.0)
MCH: 30.2 pg (ref 26.0–34.0)
MCHC: 32.4 g/dL (ref 30.0–36.0)
MCV: 93.3 fL (ref 80.0–100.0)
Monocytes Absolute: 0.4 10*3/uL (ref 0.1–1.0)
Monocytes Relative: 4 %
Neutro Abs: 7 10*3/uL (ref 1.7–7.7)
Neutrophils Relative %: 67 %
Platelets: 292 10*3/uL (ref 150–400)
RBC: 4.47 MIL/uL (ref 3.87–5.11)
RDW: 12.2 % (ref 11.5–15.5)
WBC: 10.5 10*3/uL (ref 4.0–10.5)
nRBC: 0 % (ref 0.0–0.2)

## 2019-01-22 LAB — URINE DRUG SCREEN, QUALITATIVE (ARMC ONLY)
Amphetamines, Ur Screen: NOT DETECTED
Barbiturates, Ur Screen: NOT DETECTED
Benzodiazepine, Ur Scrn: NOT DETECTED
Cannabinoid 50 Ng, Ur ~~LOC~~: NOT DETECTED
Cocaine Metabolite,Ur ~~LOC~~: POSITIVE — AB
MDMA (Ecstasy)Ur Screen: NOT DETECTED
Methadone Scn, Ur: NOT DETECTED
Opiate, Ur Screen: NOT DETECTED
Phencyclidine (PCP) Ur S: NOT DETECTED
Tricyclic, Ur Screen: NOT DETECTED

## 2019-01-22 LAB — GLUCOSE, CAPILLARY: Glucose-Capillary: 255 mg/dL — ABNORMAL HIGH (ref 70–99)

## 2019-01-22 LAB — ETHANOL: Alcohol, Ethyl (B): 10 mg/dL — ABNORMAL HIGH (ref <10)

## 2019-01-22 LAB — SALICYLATE LEVEL: Salicylate Lvl: <7 mg/dL (ref 2.8–30.0)

## 2019-01-22 MED ORDER — LORAZEPAM 2 MG/ML IJ SOLN
1.0000 mg | Freq: Once | INTRAMUSCULAR | Status: AC
  Administered 2019-01-22: 16:00:00 1 mg via INTRAVENOUS
  Filled 2019-01-22: qty 1

## 2019-01-22 MED ORDER — INSULIN ASPART 100 UNIT/ML ~~LOC~~ SOLN
9.0000 [IU] | Freq: Once | SUBCUTANEOUS | Status: AC
  Administered 2019-01-22: 17:00:00 9 [IU] via INTRAVENOUS
  Filled 2019-01-22: qty 1

## 2019-01-22 MED ORDER — SODIUM CHLORIDE 0.9 % IV BOLUS
2000.0000 mL | Freq: Once | INTRAVENOUS | Status: AC
  Administered 2019-01-22: 17:00:00 2000 mL via INTRAVENOUS

## 2019-01-22 MED ORDER — LORAZEPAM 2 MG/ML IJ SOLN: 2.0000 mg | Freq: Once | INTRAMUSCULAR | Status: DC

## 2019-01-22 MED ORDER — NALOXONE HCL 4 MG/0.1ML NA LIQD: NASAL | 0 refills | Status: DC

## 2019-01-22 MED ORDER — NICOTINE 21 MG/24HR TD PT24
21.0000 mg | MEDICATED_PATCH | Freq: Once | TRANSDERMAL | Status: DC
  Administered 2019-01-22: 21 mg via TRANSDERMAL
  Filled 2019-01-22: qty 1

## 2019-01-22 NOTE — ED Provider Notes (Addendum)
[facility] Emergency Department Provider Note  ____________________________________________   First MD Initiated Contact with Patient 01/22/19 1505     (approximate)  I have reviewed the triage vital signs and the nursing notes.   HISTORY  Chief Complaint Drug Overdose    HPI [name] is a 43 y.o. female with past medical history  as below here with reported polysubstance abuse, depression.  The patient states that she currently lives with her cousin who is a roommate.  They have been getting into significant increased recent fights.  She states she got into an argument with her today, causing her to feel severely depressed and helpless.  She began drinking, and started using cocaine as well as heroin.  This was her first time using heroin in years, which she is very upset about.  She states she did this just so that she could stop crying, denies overt suicidal ideation, though she has had suicide attempts in the past.  She is amenable to speaking to psychiatry.  No other medical complaints.  She did poorly give herself Narcan, and feels no drowsiness at this time.  She states she feels thirsty but this is baseline for her.       Past Medical History:  Diagnosis Date  . Alcohol abuse   . Anxiety   . Anxiety   . Anxiety and depression   . Asthma   . Bipolar disorder ([name])   . Borderline personality disorder ([name])   . Depression   . Diabetes ([name])    Type 2   . Dyslipidemia   . Esophageal varices ([name])   . Foot drop    right  . 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[name])   . GERD (gastroesophageal reflux disease)   . Gout   . History of drug abuse ([name])   . Insomnia   . Muscular dystrophies ([name])   . Neuropathy   . Pancreatitis   . 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 Bipolar 1 disorder, manic, moderate ([name]) 09/10/2018  . 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 Diabetes ([name])  09/10/2018  . 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  . Anxiety and depression 10/01/2012  . Protein-calorie malnutrition, severe ([name]) 09/30/2012  . Alcohol abuse 09/26/2012  . Trichomonas infection 09/26/2012  . Dehydration 09/26/2012  . Hypokalemia 11/14/2011  . Thrombocytopenia ([name]) 11/14/2011  . Alcohol withdrawal ([name]) 11/13/2011  . Bipolar 2 disorder ([name]) 11/13/2011  . Tobacco abuse 11/13/2011  . Cocaine abuse ([name]) 11/13/2011  . Gout 11/13/2011    Past Surgical History:  Procedure Laterality Date  . HERNIA REPAIR      Prior to Admission medications   Medication Sig Start Date End Date Taking? Authorizing Provider  albuterol (VENTOLIN HFA) 108 (90 Base) MCG/ACT inhaler Inhale 2 puffs into the lungs every 6 (six) hours as needed for wheezing. 09/15/18   Clapacs, Jackquline Denmark, MD  atorvastatin (LIPITOR) 40 MG tablet Take 1 tablet (40 mg total) by mouth daily. 09/15/18   Clapacs, Jackquline Denmark, MD  calcium-vitamin D (OSCAL WITH D) 500-200 MG-UNIT tablet Take 1 tablet by mouth daily with breakfast. 09/16/18   Clapacs, Jackquline Denmark, MD  cariprazine (VRAYLAR) capsule Take 1 capsule (3 mg total) by mouth daily. 09/15/18   Clapacs, Jackquline Denmark, MD  clonazePAM (KLONOPIN) 1 MG tablet Take 1 mg by mouth 3 (three) times daily as needed for anxiety.    [provider]  FLUoxetine (PROZAC) 40 MG capsule Take 2 capsules (80 mg total) by mouth daily. 09/15/18   Clapacs, Jackquline Denmark, MD  fluticasone (FLONASE) 50 MCG/ACT nasal spray Place 1 spray into both nostrils daily. 09/16/18   Clapacs, Jackquline Denmark, MD  LYRICA 50 MG capsule Take 50 mg by mouth 3 (three) times daily. 10/13/14   [provider]  metFORMIN (GLUCOPHAGE) 500 MG tablet Take 1 tablet (500 mg total) by mouth 2 (two) times daily with a meal. 09/15/18   Clapacs, Jackquline Denmark, MD  Multiple Vitamin (MULTIVITAMIN WITH MINERALS) TABS tablet Take 1 tablet by mouth daily. 09/16/18   Clapacs, Jackquline Denmark, MD  naloxone [facility]) nasal spray 4 mg/0.1  mL Take as needed for opioid overdose 01/22/19   Shaune Pollack, MD  naproxen (NAPROSYN) 250 MG tablet Take 1 tablet (250 mg total) by mouth 3 times/day as needed-between meals & bedtime for mild pain. 09/15/18   Clapacs, Jackquline Denmark, MD  Norgestimate-Ethinyl Estradiol Triphasic (TRI-SPRINTEC) 0.18/0.215/0.25 MG-35 MCG tablet Take 1 tablet by mouth daily.    [provider]  pantoprazole (PROTONIX) 40 MG tablet Take 1 tablet (40 mg total) by mouth daily. 09/16/18   Clapacs, Jackquline Denmark, MD  topiramate (TOPAMAX) 200 MG tablet Take 1 tablet (200 mg total) by mouth at bedtime. 09/15/18   Clapacs, Jackquline Denmark, MD  zolpidem (AMBIEN) 10 MG tablet Take 10 mg by mouth at bedtime as needed for sleep.    [provider]    Allergies Erythromycin, Sulfa antibiotics, Tylenol [acetaminophen], Nsaids, and Gabapentin  Family History  Problem Relation Age of Onset  . Heart disease Mother   . Heart attack Mother   . Hyperlipidemia Maternal Grandmother   . Diabetes Maternal Grandmother   . Hypertension Maternal Grandmother   . Hyperlipidemia Maternal Grandfather   . Heart disease Maternal Grandfather   . Heart attack Maternal Grandfather   . Diabetes Maternal Grandfather   . Lung cancer Maternal Grandfather   . Hypertension Maternal Grandfather   . Other Paternal Grandmother        Thyroid problems   . Heart disease Paternal Grandfather   . Stroke Paternal Grandfather   . Hyperlipidemia Paternal Grandfather   . Heart attack Paternal Grandfather   . Hypertension Paternal Grandfather     Social History Social History   Tobacco Use  . Smoking status: Current Every Day Smoker    Packs/day: 0.50  . Smokeless tobacco: Never Used  Substance Use Topics  . Alcohol use: Yes  . Drug use: Yes    Types: IV, Cocaine    Review of Systems  Review of Systems  Constitutional: Positive for fatigue. Negative for fever.  HENT: Negative for congestion and sore throat.   Eyes: Negative for visual  disturbance.  Respiratory: Negative for cough and shortness of breath.   Cardiovascular: Negative for chest pain.  Gastrointestinal: Negative for abdominal pain, diarrhea, nausea and vomiting.  Endocrine: Positive for polydipsia.  Genitourinary: Negative for flank pain.  Musculoskeletal: Negative for back pain and neck pain.  Skin: Negative for rash and wound.  Neurological: Negative for weakness.  Psychiatric/Behavioral: Positive for agitation, behavioral problems and dysphoric mood.  All other systems reviewed and are negative.    ____________________________________________  PHYSICAL EXAM:      VITAL SIGNS: ED Triage Vitals  Enc Vitals Group     BP 01/22/19 1451 (!) 152/105     Pulse Rate 01/22/19 1451 (!) 124     Resp 01/22/19 1451 20  Temp 01/22/19 1451 98.2 F (36.8 C)     Temp Source 01/22/19 1451 Oral     SpO2 01/22/19 1451 92 %     Weight 01/22/19 1452 201 lb (91.2 kg)     Height 01/22/19 1452 5\' 7"  (1.702 m)     Head Circumference --      Peak Flow --      Pain Score 01/22/19 1452 0     Pain Loc --      Pain Edu? --      Excl. in GC? --      Physical Exam Vitals signs and nursing note reviewed.  Constitutional:      General: She is not in acute distress.    Appearance: She is well-developed.  HENT:     Head: Normocephalic and atraumatic.  Eyes:     Conjunctiva/sclera: Conjunctivae normal.  Neck:     Musculoskeletal: Neck supple.  Cardiovascular:     Rate and Rhythm: Normal rate and regular rhythm.     Heart sounds: Normal heart sounds. No murmur. No friction rub.  Pulmonary:     Effort: Pulmonary effort is normal. No respiratory distress.     Breath sounds: Normal breath sounds. No wheezing or rales.  Abdominal:     General: There is no distension.     Palpations: Abdomen is soft.     Tenderness: There is no abdominal tenderness.  Skin:    General: Skin is warm.     Capillary Refill: Capillary refill takes less than 2 seconds.  Neurological:      Mental Status: She is alert and oriented to person, place, and time.     Motor: No abnormal muscle tone.  Psychiatric:        Mood and Affect: Mood is anxious and depressed. Affect is tearful.        Behavior: Behavior is cooperative.        Judgment: Judgment is impulsive.       ____________________________________________   LABS (all labs ordered are listed, but only abnormal results are displayed)  Labs Reviewed  COMPREHENSIVE METABOLIC PANEL - Abnormal; Notable for the following components:      Result Value   Chloride 97 (*)    Glucose, Bld 488 (*)    Anion gap 17 (*)    All other components within normal limits  URINE DRUG SCREEN, QUALITATIVE ([facility] ONLY) - Abnormal; Notable for the following components:   Cocaine Metabolite,Ur Lucas POSITIVE (*)    All other components within normal limits  ETHANOL - Abnormal; Notable for the following components:   Alcohol, Ethyl (B) 10 (*)    All other components within normal limits  ACETAMINOPHEN LEVEL - Abnormal; Notable for the following components:   Acetaminophen (Tylenol), Serum <10 (*)    All other components within normal limits  GLUCOSE, CAPILLARY - Abnormal; Notable for the following components:   Glucose-Capillary 255 (*)    All other components within normal limits  CBC WITH DIFFERENTIAL/PLATELET  SALICYLATE LEVEL    ____________________________________________  EKG: None ________________________________________  RADIOLOGY All imaging, including plain films, CT scans, and ultrasounds, independently reviewed by me, and interpretations confirmed via formal radiology reads.  ED MD interpretation:   None  Official radiology report(s): No results found.  ____________________________________________  PROCEDURES   Procedure(s) performed (including Critical Care):  Procedures  ____________________________________________  INITIAL IMPRESSION / MDM / ASSESSMENT AND PLAN / ED COURSE  As part of my medical  decision making, I reviewed the following  data within the electronic MEDICAL RECORD NUMBER Notes from prior ED visits and [place] Controlled Substance Database      *HEER JUSTISS was evaluated in Emergency Department on 01/23/2019 for the symptoms described in the history of present illness. She was evaluated in the context of the global COVID-19 pandemic, which necessitated consideration that the patient might be at risk for infection with the SARS-CoV-2 virus that causes COVID-19. Institutional protocols and algorithms that pertain to the evaluation of patients at risk for COVID-19 are in a state of rapid change based on information released by regulatory bodies including the CDC and federal and state organizations. These policies and algorithms were followed during the patient's care in the ED.  Some ED evaluations and interventions may be delayed as a result of limited staffing during the pandemic.*   Clinical Course as of Jan 22 205  Thu Jan 22, 2019  1731 Labs show hyperglycemia, normal Co2 no signs of DKA. AG 17 likely 2/2 dehydration and recent cocaine abuse. IVF, insulin given. Will re-check. If improving, can medically clear.   [CI]    Clinical Course User Index [CI] Duffy Bruce, MD    Medical Decision Making:  43 yo F with PMHx above including bipolar d/o, polysubstance abuse, reported borderline PD here w/ substance abuse and intoxication in setting of multiple recent stressors. High risk given her history. Denies overt SI currently but will c/s Psych. Screening labs sent.  After sobering, pt now declining any SI/HI/AVH. She is calm and cooperative. Blood sugar decreased, VSS. Cleared by TTS as well - denied any concerning psych sx on their report. D/c home.   ADDENDUM: Upon discharge, pt upset again w/ cousin but denies HI, SI. She does not appear intoxicated. She actually may have mild tremor from not drinking currently. HR 140s. She was normal prior to this and well appearing with  several hour obs in ED. I suspect this is multifactorial 2/2 dehydration, agitation from conversation, less likely early mild EtOH w/d. I advised her to stay for additional fluids, ativan, monitoring but pt refuses. She is otherwise well appearing. She understands risks of leaving AMA now with abnormal VS. ____________________________________________  FINAL CLINICAL IMPRESSION(S) / ED DIAGNOSES  Final diagnoses:  Hyperglycemia  Opiate overdose, accidental or unintentional, initial encounter ([place])  Depression, unspecified depression type     MEDICATIONS GIVEN DURING THIS VISIT:  Medications  LORazepam (ATIVAN) injection 1 mg (1 mg Intravenous Given 01/22/19 1600)  sodium chloride 0.9 % bolus 2,000 mL (0 mLs Intravenous Stopped 01/22/19 1920)  insulin aspart (novoLOG) injection 9 Units (9 Units Intravenous Given 01/22/19 1657)     ED Discharge Orders         Ordered    naloxone (NARCAN) nasal spray 4 mg/0.1 mL     01/22/19 1905           Note:  This document was prepared using Dragon voice recognition software and may include unintentional dictation errors.   Duffy Bruce, MD 01/22/19 Loretha Stapler    Duffy Bruce, MD 01/23/19 [phone]

## 2019-01-22 NOTE — Patient Outreach (Signed)
[facility]) Care Management  01/22/2019  REID NAWROT 12/19/1975 [id number]   Medication Adherence call to Mrs. Elio Forget Telephone call to Patient regarding Medication Adherence unable to reach patient. Mrs. Art is showing past due on Metformin Er 500 mg under [place].   [place] Management Direct Dial [phone]  Fax [phone] [email or web address]

## 2019-01-22 NOTE — BH Assessment (Signed)
Assessment Note  [name] is an 43 y.o. female who presents to the ER due to having concerns about her physical health from her recent drug use. While in the ER, patient told staff she was having thoughts of SI. With this writer she denied having any thoughts. She states she was upset with her self for using heroin, following an argument with her cousin, with whom she lives with. She further reports, she hadn't used heroin since 2006 and she was concerned about her blood pressure.  During the interview, the patient was calm, cooperative and pleasant. She was able to provide appropriate answers to the questions. Throughout the interview, she denied SI/HI and AV/H. "I might have problems but I don't want to kill myself." She states she is currently receiving outpatient treatment and have an assigned Peer Environmental manager. Patient is requesting to go home, after she is medically cleared and address her mental health and substance use with her outpatient provider.  Diagnosis: Substance Use Disorder  Past Medical History:  Past Medical History:  Diagnosis Date  . Alcohol abuse   . Anxiety   . Anxiety   . Anxiety and depression   . Asthma   . Bipolar disorder ([name])   . Borderline personality disorder ([name])   . Depression   . Diabetes ([name])    Type 2   . Dyslipidemia   . Esophageal varices ([name])   . Foot drop    right  . 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[name])   . GERD (gastroesophageal reflux disease)   . Gout   . History of drug abuse ([name])   . Insomnia   . Muscular dystrophies ([name])   . Neuropathy   . Pancreatitis   . 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 HERNIA REPAIR      Family History:  Family History  Problem Relation Age of Onset  . Heart disease Mother   . Heart attack Mother   . Hyperlipidemia Maternal Grandmother   . Diabetes Maternal Grandmother   . Hypertension Maternal Grandmother   . Hyperlipidemia  Maternal Grandfather   . Heart disease Maternal Grandfather   . Heart attack Maternal Grandfather   . Diabetes Maternal Grandfather   . Lung cancer Maternal Grandfather   . Hypertension Maternal Grandfather   . Other Paternal Grandmother        Thyroid problems   . Heart disease Paternal Grandfather   . Stroke Paternal Grandfather   . Hyperlipidemia Paternal Grandfather   . Heart attack Paternal Grandfather   . Hypertension Paternal Grandfather     Social History:  reports that she has been smoking. She has been smoking about 0.50 packs per day. She has never used smokeless tobacco. She reports current alcohol use. She reports current drug use. Drugs: IV and Cocaine.  Additional Social History:  Alcohol / Drug Use Pain Medications: See PTA Prescriptions: See PTA Over the Counter: See PTA History of alcohol / drug use?: Yes Longest period of sobriety (when/how long): Unable to quantify Negative Consequences of Use: Personal relationships, Work / School Substance #1 Name of Substance 1: Heroin Substance #2 Name of Substance 2: Cocaine Substance #3 Name of Substance 3: Alcohol  CIWA: CIWA-Ar BP: (!) 152/105 Pulse Rate: (!) 124 COWS:    Allergies:  Allergies  Allergen Reactions  . Erythromycin Anaphylaxis  . Sulfa Antibiotics Anaphylaxis  . Tylenol [Acetaminophen] Other (See Comments)    Pancreatitis and liver dysfunction,  not supposed to take med  . Nsaids Other (See Comments)    Causes GI problems, very bad reaction-per patient.   . Gabapentin     Mood changes     Home Medications: (Not in a hospital admission)   OB/GYN Status:  No LMP recorded. (Menstrual status: Oral contraceptives).  General Assessment Data [facility] Assessment: In system Is this a Tele or Face-to-Face Assessment?: Face-to-Face Is this an Initial Assessment or a Re-assessment for this encounter?: Initial Assessment Language Other than English: No Living  Arrangements: Other (Comment)(Private Home) What gender do you identify as?: Female Marital status: Single Pregnancy Status: No Living Arrangements: Other relatives Can pt return to current living arrangement?: No Admission Status: Voluntary Is patient capable of signing voluntary admission?: Yes Referral Source: Self/Family/Friend [facility]  Medical Screening Exam [facility] Walk-in ONLY) Medical Exam completed: Yes  Crisis Care Plan Living Arrangements: Other relatives Legal Guardian: Other:(Self) Name of Psychiatrist: [facility] Name of Therapist: [facility]  Education Status Is patient currently in school?: No Is the patient employed, unemployed or receiving disability?: Unemployed, Receiving disability income  Risk to self with the past 6 months Suicidal Ideation: No Has patient been a risk to self within the past 6 months prior to admission? : No Suicidal Intent: No Has patient had any suicidal intent within the past 6 months prior to admission? : No Is patient at risk for suicide?: No Suicidal Plan?: No Has patient had any suicidal plan within the past 6 months prior to admission? : No Access to Means: No What has been your use of drugs/alcohol within the last 12 months?: Heroin, Cocaine & Alcohol Previous Attempts/Gestures: No How many times?: 0 Other Self Harm Risks: Active Drug use Triggers for Past Attempts: None known Intentional Self Injurious Behavior: None Family Suicide History: No Recent stressful life event(s): Other (Comment) Persecutory voices/beliefs?: No Depression: Yes Depression Symptoms: Isolating, Tearfulness Substance abuse history and/or treatment for substance abuse?: Yes Suicide prevention information given to non-admitted patients: Not applicable  Risk to Others within the past 6 months Homicidal Ideation: No Does patient have any lifetime risk of violence toward others beyond the six months prior to admission? :  No Thoughts of Harm to Others: No Current Homicidal Intent: No Current Homicidal Plan: No Access to Homicidal Means: No Identified Victim: Reports of none History of harm to others?: No Assessment of Violence: None Noted Violent Behavior Description: Reports of none Does patient have access to weapons?: No Criminal Charges Pending?: No Does patient have a court date: No Is patient on probation?: No  Psychosis Hallucinations: None noted Delusions: None noted  Mental Status Report Appearance/Hygiene: Unremarkable, In scrubs Eye Contact: Good Motor Activity: Unable to assess Speech: Logical/coherent, Unremarkable Level of Consciousness: Alert Mood: Anxious, Sad, Pleasant Affect: Appropriate to circumstance, Sad Anxiety Level: Minimal Thought Processes: Coherent, Relevant Judgement: Unimpaired Orientation: Person, Place, Time, Situation, Appropriate for developmental age Obsessive Compulsive Thoughts/Behaviors: None  Cognitive Functioning Concentration: Normal Memory: Recent Intact, Remote Intact Is patient IDD: No Insight: Fair Impulse Control: Fair Appetite: Good Have you had any weight changes? : No Change Sleep: No Change(History of Insomnia) Total Hours of Sleep: 4(Ongoing trouble falling and staying asleep) Vegetative Symptoms: None  ADLScreening [facility] Assessment Services) Patient's cognitive ability adequate to safely complete daily activities?: Yes Patient able to express need for assistance with ADLs?: Yes Independently performs ADLs?: Yes (appropriate for developmental age)  Prior Inpatient Therapy Prior Inpatient Therapy: No  Prior Outpatient Therapy Prior Outpatient Therapy: Yes Prior Therapy Dates: Current Prior Therapy Facilty/Provider(s): [facility] Reason for Treatment: Depression & Bipolar Does patient have an ACCT team?: Unknown Does patient have Intensive In-House Services?  : No Does patient have [facility] services? : No Does patient have  P4CC services?: No  ADL Screening (condition at time of admission) Patient's cognitive ability adequate to safely complete daily activities?: Yes Is the patient deaf or have difficulty hearing?: No Does the patient have difficulty seeing, even when wearing glasses/contacts?: No Does the patient have difficulty concentrating, remembering, or making decisions?: No Patient able to express need for assistance with ADLs?: Yes Does the patient have difficulty dressing or bathing?: No Independently performs ADLs?: Yes (appropriate for developmental age) Does the patient have difficulty walking or climbing stairs?: No Weakness of Legs: None Weakness of Arms/Hands: None  Home Assistive Devices/Equipment Home Assistive Devices/Equipment: None  Therapy Consults (therapy consults require a physician order) PT Evaluation Needed: No OT Evalulation Needed: No SLP Evaluation Needed: No Abuse/Neglect Assessment (Assessment to be complete while patient is alone) Abuse/Neglect Assessment Can Be Completed: Yes Physical Abuse: Denies Verbal Abuse: Denies Sexual Abuse: Denies Exploitation of patient/patient's resources: Denies Self-Neglect: Denies Values / Beliefs Cultural Requests During Hospitalization: None Spiritual Requests During Hospitalization: None Consults Spiritual Care Consult Needed: No Social Work Consult Needed: No Regulatory affairs officer (For Healthcare) Does Patient Have a Medical Advance Directive?: No Would patient like information on creating a medical advance directive?: No - Patient declined  Child/Adolescent Assessment Running Away Risk: Denies(Patient is an adult)  Disposition:  Disposition Initial Assessment Completed for this Encounter: Yes  On Site Evaluation by:   Reviewed with Physician:    Gunnar Fusi MS, LCAS, [facility], [place] Therapeutic Triage Specialist 01/22/2019 7:26 PM

## 2019-01-22 NOTE — ED Triage Notes (Signed)
Pt to ED via EMS from home c/o using alcohol and cocaine throughout last night and lastly heroin around 7am this morning.  Denies SI/HI.  Patient cooperative but fidgety in triage.

## 2019-01-22 NOTE — ED Notes (Signed)
Reviewed discharge instructions, follow-up care, and prescriptions with patient. Patient verbalized understanding of all information reviewed. Patient stable, with no distress noted at this time.    

## 2019-01-22 NOTE — ED Notes (Signed)
PT  VOL °

## 2019-01-22 NOTE — ED Triage Notes (Signed)
Pt in via EMS from home for overdose of heroin. EMS reports that pt called her 2nd grade teacher who is in her 51's because she did not remember how to use her narcan. #20 left lower arm.EMS reports pt has heroin, cocaine and alcohol on board.

## 2019-05-13 DIAGNOSIS — Z79899 Other long term (current) drug therapy: Secondary | ICD-10-CM | POA: Diagnosis not present

## 2019-05-13 DIAGNOSIS — M109 Gout, unspecified: Secondary | ICD-10-CM | POA: Diagnosis not present

## 2019-05-13 DIAGNOSIS — E785 Hyperlipidemia, unspecified: Secondary | ICD-10-CM | POA: Diagnosis not present

## 2019-05-13 DIAGNOSIS — E1142 Type 2 diabetes mellitus with diabetic polyneuropathy: Secondary | ICD-10-CM | POA: Diagnosis not present

## 2019-05-13 DIAGNOSIS — G629 Polyneuropathy, unspecified: Secondary | ICD-10-CM | POA: Diagnosis not present

## 2019-07-16 ENCOUNTER — Ambulatory Visit: Payer: Medicare Other | Admitting: Neurology

## 2019-08-13 ENCOUNTER — Ambulatory Visit: Payer: Medicare Other | Admitting: Neurology

## 2019-08-13 NOTE — Progress Notes (Deleted)
PATIENT: [name] DOB: 1976-02-20  REASON FOR VISIT: follow up HISTORY FROM: patient  HISTORY OF PRESENT ILLNESS: Today 08/13/19  [name] is a 44 year old female initially seen by [name] in January 2020.  History of obesity, diabetes, bipolar, alcohol cocaine abuse.  Has a family history of FSH muscular dystrophy.  When last seen, was sent for physical therapy and Occupational Therapy.  She has bilateral foot drops, has AFO braces.  HISTORY 03/28/2018 [name]: [name] is a 44 year old right-handed white female with a history of obesity, diabetes, bipolar disorder, and a prior history of alcohol and cocaine abuse.  The patient has a history of FSH muscular dystrophy.  She has a strong family history of this, her mother and a maternal uncle were affected.  She has no first cousins with this disease.  The patient had initially noted some weakness in the lower extremities when she was in her late 55s.  The patient has progressed to have severe bilateral foot drops, she has AFO braces but does not wear them regularly.  The patient has developed some weakness of the arms, particularly with the deltoid muscles and has difficulty raising her arms up above her head.  The patient has fallen on occasion, she has fatigue of the legs when she has to walk longer distances such as in a store, she has to hold onto a cart in order to maintain the upright posture.  The patient does have significant low back pain when she is partially stoop with washing dishes.  Otherwise, she does not have back pain.  She has been trying to lose weight actively.  The patient does note some numbness in the toes, she may have some cramping in the feet at times.  She has some discomfort from the elbows to the shoulders on both arms.  The patient denies issues controlling the bowels or the bladder.  The patient comes to this office for an evaluation.    REVIEW OF SYSTEMS: Out of a complete 14 system review of symptoms,  the patient complains only of the following symptoms, and all other reviewed systems are negative.  ALLERGIES: Allergies  Allergen Reactions  . Erythromycin Anaphylaxis  . Sulfa Antibiotics Anaphylaxis  . Tylenol [Acetaminophen] Other (See Comments)    Pancreatitis and liver dysfunction, not supposed to take med  . Nsaids Other (See Comments)    Causes GI problems, very bad reaction-per patient.   . Gabapentin     Mood changes     HOME MEDICATIONS: Outpatient Medications Prior to Visit  Medication Sig Dispense Refill  . albuterol (VENTOLIN HFA) 108 (90 Base) MCG/ACT inhaler Inhale 2 puffs into the lungs every 6 (six) hours as needed for wheezing. 6.7 g 1  . atorvastatin (LIPITOR) 40 MG tablet Take 1 tablet (40 mg total) by mouth daily. 30 tablet 1  . calcium-vitamin D (OSCAL WITH D) 500-200 MG-UNIT tablet Take 1 tablet by mouth daily with breakfast. 30 tablet 1  . cariprazine (VRAYLAR) capsule Take 1 capsule (3 mg total) by mouth daily. 30 capsule 1  . clonazePAM (KLONOPIN) 1 MG tablet Take 1 mg by mouth 3 (three) times daily as needed for anxiety.    Marland Kitchen FLUoxetine (PROZAC) 40 MG capsule Take 2 capsules (80 mg total) by mouth daily. 60 capsule 1  . fluticasone (FLONASE) 50 MCG/ACT nasal spray Place 1 spray into both nostrils daily. 9.9 mL 1  . LYRICA 50 MG capsule Take 50 mg by mouth 3 (three)  times daily.    . metFORMIN (GLUCOPHAGE) 500 MG tablet Take 1 tablet (500 mg total) by mouth 2 (two) times daily with a meal. 60 tablet 1  . Multiple Vitamin (MULTIVITAMIN WITH MINERALS) TABS tablet Take 1 tablet by mouth daily. 30 tablet 1  . naloxone (NARCAN) nasal spray 4 mg/0.1 mL Take as needed for opioid overdose 2 each 0  . naproxen (NAPROSYN) 250 MG tablet Take 1 tablet (250 mg total) by mouth 3 times/day as needed-between meals & bedtime for mild pain. 60 tablet 1  . Norgestimate-Ethinyl Estradiol Triphasic (TRI-SPRINTEC) 0.18/0.215/0.25 MG-35 MCG tablet Take 1 tablet by mouth daily.    .  pantoprazole (PROTONIX) 40 MG tablet Take 1 tablet (40 mg total) by mouth daily. 30 tablet 1  . topiramate (TOPAMAX) 200 MG tablet Take 1 tablet (200 mg total) by mouth at bedtime. 30 tablet 1  . zolpidem (AMBIEN) 10 MG tablet Take 10 mg by mouth at bedtime as needed for sleep.     No facility-administered medications prior to visit.    PAST MEDICAL HISTORY: Past Medical History:  Diagnosis Date  . Alcohol abuse   . Anxiety   . Anxiety   . Anxiety and depression   . Asthma   . Bipolar disorder ([name])   . Borderline personality disorder ([name])   . Depression   . Diabetes ([name])    Type 2   . Dyslipidemia   . Esophageal varices ([name])   . Foot drop    right  . 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[name])   . GERD (gastroesophageal reflux disease)   . Gout   . History of drug abuse ([name])   . Insomnia   . Muscular dystrophies ([name])   . Neuropathy   . Pancreatitis   . Recovering alcoholic ([name])     PAST SURGICAL HISTORY: Past Surgical History:  Procedure Laterality Date  . HERNIA REPAIR      FAMILY HISTORY: Family History  Problem Relation Age of Onset  . Heart disease Mother   . Heart attack Mother   . Hyperlipidemia Maternal Grandmother   . Diabetes Maternal Grandmother   . Hypertension Maternal Grandmother   . Hyperlipidemia Maternal Grandfather   . Heart disease Maternal Grandfather   . Heart attack Maternal Grandfather   . Diabetes Maternal Grandfather   . Lung cancer Maternal Grandfather   . Hypertension Maternal Grandfather   . Other Paternal Grandmother        Thyroid problems   . Heart disease Paternal Grandfather   . Stroke Paternal Grandfather   . Hyperlipidemia Paternal Grandfather   . Heart attack Paternal Grandfather   . Hypertension Paternal Grandfather     SOCIAL HISTORY: Social History   Socioeconomic History  . Marital status: Legally Separated    Spouse name: Not on file  . Number of children: Not on file  . Years of education:  Not on file  . Highest education level: Not on file  Occupational History  . Occupation: disabled  Tobacco Use  . Smoking status: Current Every Day Smoker    Packs/day: 0.50  . Smokeless tobacco: Never Used  Substance and Sexual Activity  . Alcohol use: Yes  . Drug use: Yes    Types: IV, Cocaine  . Sexual activity: Yes    Birth control/protection: None  Other Topics Concern  . Not on file  Social History Narrative  . Not on file   Social Determinants of Health   Financial Resource Strain:   .  Difficulty of Paying Living Expenses:   Food Insecurity:   . Worried About Charity fundraiser in the Last Year:   . Arboriculturist in the Last Year:   Transportation Needs:   . Film/video editor (Medical):   Marland Kitchen Lack of Transportation (Non-Medical):   Physical Activity:   . Days of Exercise per Week:   . Minutes of Exercise per Session:   Stress:   . Feeling of Stress :   Social Connections:   . Frequency of Communication with Friends and Family:   . Frequency of Social Gatherings with Friends and Family:   . Attends Religious Services:   . Active Member of Clubs or Organizations:   . Attends Archivist Meetings:   Marland Kitchen Marital Status:   Intimate Partner Violence:   . Fear of Current or Ex-Partner:   . Emotionally Abused:   Marland Kitchen Physically Abused:   . Sexually Abused:       PHYSICAL EXAM  There were no vitals filed for this visit. There is no height or weight on file to calculate BMI.  Generalized: Well developed, in no acute distress   Neurological examination  Mentation: Alert oriented to time, place, history taking. Follows all commands speech and language fluent Cranial nerve II-XII: Pupils were equal round reactive to light. Extraocular movements were full, visual field were full on confrontational test. Facial sensation and strength were normal. Uvula tongue midline. Head turning and shoulder shrug  were normal and symmetric. Motor: The motor testing  reveals 5 over 5 strength of all 4 extremities. Good symmetric motor tone is noted throughout.  Sensory: Sensory testing is intact to soft touch on all 4 extremities. No evidence of extinction is noted.  Coordination: Cerebellar testing reveals good finger-nose-finger and heel-to-shin bilaterally.  Gait and station: Gait is normal. Tandem gait is normal. Romberg is negative. No drift is seen.  Reflexes: Deep tendon reflexes are symmetric and normal bilaterally.   DIAGNOSTIC DATA (LABS, IMAGING, TESTING) - I reviewed patient records, labs, notes, testing and imaging myself where available.  Lab Results  Component Value Date   WBC 10.5 01/22/2019   HGB 13.5 01/22/2019   HCT 41.7 01/22/2019   MCV 93.3 01/22/2019   PLT 292 01/22/2019      Component Value Date/Time   NA 137 01/22/2019 1458   K 3.7 01/22/2019 1458   CL 97 (L) 01/22/2019 1458   CO2 23 01/22/2019 1458   GLUCOSE 488 (H) 01/22/2019 1458   BUN 6 01/22/2019 1458   CREATININE 0.68 01/22/2019 1458   CALCIUM 9.4 01/22/2019 1458   PROT 7.9 01/22/2019 1458   ALBUMIN 3.8 01/22/2019 1458   AST 37 01/22/2019 1458   ALT 12 01/22/2019 1458   ALKPHOS 93 01/22/2019 1458   BILITOT 0.4 01/22/2019 1458   GFRNONAA >60 01/22/2019 1458   GFRAA >60 01/22/2019 1458   No results found for: CHOL, HDL, LDLCALC, LDLDIRECT, TRIG, CHOLHDL Lab Results  Component Value Date   HGBA1C 5.2 09/27/2012   Lab Results  Component Value Date   VITAMINB12 351 10/10/2010   Lab Results  Component Value Date   TSH 2.513 09/28/2012      ASSESSMENT AND PLAN 44 y.o. year old female  has a past medical history of Alcohol abuse, Anxiety, Anxiety, Anxiety and depression, Asthma, Bipolar disorder ([place]), Borderline personality disorder ([place]), Depression, Diabetes ([place]), Dyslipidemia, Esophageal varices ([place]), Foot drop, 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[place]), GERD (gastroesophageal reflux disease), Gout, History of drug  abuse ([name]), Insomnia,  Muscular dystrophies ([name]), Neuropathy, Pancreatitis, and Recovering alcoholic ([name]). here with ***   I spent 15 minutes with the patient. 50% of this time was spent   Margie Ege, [place], DNP 08/13/2019, 5:26 AM [facility] [address], [address] [place], [place] [address] [phone]

## 2019-08-26 ENCOUNTER — Ambulatory Visit: Payer: Medicare Other | Admitting: Neurology

## 2019-08-26 DIAGNOSIS — N3281 Overactive bladder: Secondary | ICD-10-CM | POA: Diagnosis not present

## 2019-08-26 DIAGNOSIS — J029 Acute pharyngitis, unspecified: Secondary | ICD-10-CM | POA: Diagnosis not present

## 2019-08-26 DIAGNOSIS — E1142 Type 2 diabetes mellitus with diabetic polyneuropathy: Secondary | ICD-10-CM | POA: Diagnosis not present

## 2019-08-26 DIAGNOSIS — K219 Gastro-esophageal reflux disease without esophagitis: Secondary | ICD-10-CM | POA: Diagnosis not present

## 2019-08-26 NOTE — Progress Notes (Deleted)
PATIENT: [name] DOB: 01-Oct-1975  REASON FOR VISIT: follow up HISTORY FROM: patient  HISTORY OF PRESENT ILLNESS: Today 08/26/19  [name] is a 44 year old female who has not been seen in the office since January 2020.  History of obesity, diabetes, bipolar, and prior history of alcohol and cocaine abuse.  She has history of FSH muscular dystrophy.  HISTORY 03/28/2018 Dr. Anne Hahn: [name] is a 44 year old right-handed white female with a history of obesity, diabetes, bipolar disorder, and a prior history of alcohol and cocaine abuse.  The patient has a history of FSH muscular dystrophy.  She has a strong family history of this, her mother and a maternal uncle were affected.  She has no first cousins with this disease.  The patient had initially noted some weakness in the lower extremities when she was in her late 48s.  The patient has progressed to have severe bilateral foot drops, she has AFO braces but does not wear them regularly.  The patient has developed some weakness of the arms, particularly with the deltoid muscles and has difficulty raising her arms up above her head.  The patient has fallen on occasion, she has fatigue of the legs when she has to walk longer distances such as in a store, she has to hold onto a cart in order to maintain the upright posture.  The patient does have significant low back pain when she is partially stoop with washing dishes.  Otherwise, she does not have back pain.  She has been trying to lose weight actively.  The patient does note some numbness in the toes, she may have some cramping in the feet at times.  She has some discomfort from the elbows to the shoulders on both arms.  The patient denies issues controlling the bowels or the bladder.  The patient comes to this office for an evaluation.   REVIEW OF SYSTEMS: Out of a complete 14 system review of symptoms, the patient complains only of the following symptoms, and all other reviewed systems are  negative.  ALLERGIES: Allergies  Allergen Reactions  . Erythromycin Anaphylaxis  . Sulfa Antibiotics Anaphylaxis  . Tylenol [Acetaminophen] Other (See Comments)    Pancreatitis and liver dysfunction, not supposed to take med  . Nsaids Other (See Comments)    Causes GI problems, very bad reaction-per patient.   . Gabapentin     Mood changes     HOME MEDICATIONS: Outpatient Medications Prior to Visit  Medication Sig Dispense Refill  . albuterol (VENTOLIN HFA) 108 (90 Base) MCG/ACT inhaler Inhale 2 puffs into the lungs every 6 (six) hours as needed for wheezing. 6.7 g 1  . atorvastatin (LIPITOR) 40 MG tablet Take 1 tablet (40 mg total) by mouth daily. 30 tablet 1  . calcium-vitamin D (OSCAL WITH D) 500-200 MG-UNIT tablet Take 1 tablet by mouth daily with breakfast. 30 tablet 1  . cariprazine (VRAYLAR) capsule Take 1 capsule (3 mg total) by mouth daily. 30 capsule 1  . clonazePAM (KLONOPIN) 1 MG tablet Take 1 mg by mouth 3 (three) times daily as needed for anxiety.    Marland Kitchen FLUoxetine (PROZAC) 40 MG capsule Take 2 capsules (80 mg total) by mouth daily. 60 capsule 1  . fluticasone (FLONASE) 50 MCG/ACT nasal spray Place 1 spray into both nostrils daily. 9.9 mL 1  . LYRICA 50 MG capsule Take 50 mg by mouth 3 (three) times daily.    . metFORMIN (GLUCOPHAGE) 500 MG tablet Take 1 tablet (500  mg total) by mouth 2 (two) times daily with a meal. 60 tablet 1  . Multiple Vitamin (MULTIVITAMIN WITH MINERALS) TABS tablet Take 1 tablet by mouth daily. 30 tablet 1  . naloxone (NARCAN) nasal spray 4 mg/0.1 mL Take as needed for opioid overdose 2 each 0  . naproxen (NAPROSYN) 250 MG tablet Take 1 tablet (250 mg total) by mouth 3 times/day as needed-between meals & bedtime for mild pain. 60 tablet 1  . Norgestimate-Ethinyl Estradiol Triphasic (TRI-SPRINTEC) 0.18/0.215/0.25 MG-35 MCG tablet Take 1 tablet by mouth daily.    . pantoprazole (PROTONIX) 40 MG tablet Take 1 tablet (40 mg total) by mouth daily. 30  tablet 1  . topiramate (TOPAMAX) 200 MG tablet Take 1 tablet (200 mg total) by mouth at bedtime. 30 tablet 1  . zolpidem (AMBIEN) 10 MG tablet Take 10 mg by mouth at bedtime as needed for sleep.     No facility-administered medications prior to visit.    PAST MEDICAL HISTORY: Past Medical History:  Diagnosis Date  . Alcohol abuse   . Anxiety   . Anxiety   . Anxiety and depression   . Asthma   . Bipolar disorder ([name])   . Borderline personality disorder ([name])   . Depression   . Diabetes ([name])    Type 2   . Dyslipidemia   . Esophageal varices ([name])   . Foot drop    right  . 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[name])   . GERD (gastroesophageal reflux disease)   . Gout   . History of drug abuse ([name])   . Insomnia   . Muscular dystrophies ([name])   . Neuropathy   . Pancreatitis   . Recovering alcoholic ([name])     PAST SURGICAL HISTORY: Past Surgical History:  Procedure Laterality Date  . HERNIA REPAIR      FAMILY HISTORY: Family History  Problem Relation Age of Onset  . Heart disease Mother   . Heart attack Mother   . Hyperlipidemia Maternal Grandmother   . Diabetes Maternal Grandmother   . Hypertension Maternal Grandmother   . Hyperlipidemia Maternal Grandfather   . Heart disease Maternal Grandfather   . Heart attack Maternal Grandfather   . Diabetes Maternal Grandfather   . Lung cancer Maternal Grandfather   . Hypertension Maternal Grandfather   . Other Paternal Grandmother        Thyroid problems   . Heart disease Paternal Grandfather   . Stroke Paternal Grandfather   . Hyperlipidemia Paternal Grandfather   . Heart attack Paternal Grandfather   . Hypertension Paternal Grandfather     SOCIAL HISTORY: Social History   Socioeconomic History  . Marital status: Legally Separated    Spouse name: Not on file  . Number of children: Not on file  . Years of education: Not on file  . Highest education level: Not on file  Occupational History  .  Occupation: disabled  Tobacco Use  . Smoking status: Current Every Day Smoker    Packs/day: 0.50  . Smokeless tobacco: Never Used  Substance and Sexual Activity  . Alcohol use: Yes  . Drug use: Yes    Types: IV, Cocaine  . Sexual activity: Yes    Birth control/protection: None  Other Topics Concern  . Not on file  Social History Narrative  . Not on file   Social Determinants of Health   Financial Resource Strain:   . Difficulty of Paying Living Expenses:   Food Insecurity:   . Worried About  Running Out of Food in the Last Year:   . [place] in the Last Year:   Transportation Needs:   . Lack of Transportation (Medical):   Marland Kitchen Lack of Transportation (Non-Medical):   Physical Activity:   . Days of Exercise per Week:   . Minutes of Exercise per Session:   Stress:   . Feeling of Stress :   Social Connections:   . Frequency of Communication with Friends and Family:   . Frequency of Social Gatherings with Friends and Family:   . Attends Religious Services:   . Active Member of Clubs or Organizations:   . Attends Archivist Meetings:   Marland Kitchen Marital Status:   Intimate Partner Violence:   . Fear of Current or Ex-Partner:   . Emotionally Abused:   Marland Kitchen Physically Abused:   . Sexually Abused:       PHYSICAL EXAM  There were no vitals filed for this visit. There is no height or weight on file to calculate BMI.  Generalized: Well developed, in no acute distress   Neurological examination  Mentation: Alert oriented to time, place, history taking. Follows all commands speech and language fluent Cranial nerve II-XII: Pupils were equal round reactive to light. Extraocular movements were full, visual field were full on confrontational test. Facial sensation and strength were normal. Uvula tongue midline. Head turning and shoulder shrug  were normal and symmetric. Motor: The motor testing reveals 5 over 5 strength of all 4 extremities. Good symmetric motor tone is noted  throughout.  Sensory: Sensory testing is intact to soft touch on all 4 extremities. No evidence of extinction is noted.  Coordination: Cerebellar testing reveals good finger-nose-finger and heel-to-shin bilaterally.  Gait and station: Gait is normal. Tandem gait is normal. Romberg is negative. No drift is seen.  Reflexes: Deep tendon reflexes are symmetric and normal bilaterally.   DIAGNOSTIC DATA (LABS, IMAGING, TESTING) - I reviewed patient records, labs, notes, testing and imaging myself where available.  Lab Results  Component Value Date   WBC 10.5 01/22/2019   HGB 13.5 01/22/2019   HCT 41.7 01/22/2019   MCV 93.3 01/22/2019   PLT 292 01/22/2019      Component Value Date/Time   NA 137 01/22/2019 1458   K 3.7 01/22/2019 1458   CL 97 (L) 01/22/2019 1458   CO2 23 01/22/2019 1458   GLUCOSE 488 (H) 01/22/2019 1458   BUN 6 01/22/2019 1458   CREATININE 0.68 01/22/2019 1458   CALCIUM 9.4 01/22/2019 1458   PROT 7.9 01/22/2019 1458   ALBUMIN 3.8 01/22/2019 1458   AST 37 01/22/2019 1458   ALT 12 01/22/2019 1458   ALKPHOS 93 01/22/2019 1458   BILITOT 0.4 01/22/2019 1458   GFRNONAA >60 01/22/2019 1458   GFRAA >60 01/22/2019 1458   No results found for: CHOL, HDL, LDLCALC, LDLDIRECT, TRIG, CHOLHDL Lab Results  Component Value Date   HGBA1C 5.2 09/27/2012   Lab Results  Component Value Date   VITAMINB12 351 10/10/2010   Lab Results  Component Value Date   TSH 2.513 09/28/2012      ASSESSMENT AND PLAN 44 y.o. year old female  has a past medical history of Alcohol abuse, Anxiety, Anxiety, Anxiety and depression, Asthma, Bipolar disorder ([place]), Borderline personality disorder ([place]), Depression, Diabetes ([place]), Dyslipidemia, Esophageal varices ([place]), Foot drop, 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[place]), GERD (gastroesophageal reflux disease), Gout, History of drug abuse ([place]), Insomnia, Muscular dystrophies ([place]), Neuropathy, Pancreatitis, and Recovering alcoholic  ([place]). here  with ***   I spent 15 minutes with the patient. 50% of this time was spent   Margie Ege, [place], DNP 08/26/2019, 5:49 AM [facility] [address], [address] [place], [place] [address] [phone]

## 2019-09-14 DIAGNOSIS — Z79899 Other long term (current) drug therapy: Secondary | ICD-10-CM | POA: Diagnosis not present

## 2019-09-14 DIAGNOSIS — E1142 Type 2 diabetes mellitus with diabetic polyneuropathy: Secondary | ICD-10-CM | POA: Diagnosis not present

## 2019-09-14 DIAGNOSIS — M109 Gout, unspecified: Secondary | ICD-10-CM | POA: Diagnosis not present

## 2019-09-14 DIAGNOSIS — R609 Edema, unspecified: Secondary | ICD-10-CM | POA: Diagnosis not present

## 2019-09-14 DIAGNOSIS — M791 Myalgia, unspecified site: Secondary | ICD-10-CM | POA: Diagnosis not present

## 2019-09-14 DIAGNOSIS — E785 Hyperlipidemia, unspecified: Secondary | ICD-10-CM | POA: Diagnosis not present

## 2019-09-22 DIAGNOSIS — R197 Diarrhea, unspecified: Secondary | ICD-10-CM | POA: Diagnosis not present

## 2019-09-22 DIAGNOSIS — R1032 Left lower quadrant pain: Secondary | ICD-10-CM | POA: Diagnosis not present

## 2019-09-30 DIAGNOSIS — R197 Diarrhea, unspecified: Secondary | ICD-10-CM | POA: Diagnosis not present

## 2019-10-07 DIAGNOSIS — R197 Diarrhea, unspecified: Secondary | ICD-10-CM | POA: Diagnosis not present

## 2019-10-07 DIAGNOSIS — R1031 Right lower quadrant pain: Secondary | ICD-10-CM | POA: Diagnosis not present

## 2019-10-07 DIAGNOSIS — R1032 Left lower quadrant pain: Secondary | ICD-10-CM | POA: Diagnosis not present

## 2019-11-24 DIAGNOSIS — L299 Pruritus, unspecified: Secondary | ICD-10-CM | POA: Diagnosis not present

## 2019-11-24 DIAGNOSIS — K219 Gastro-esophageal reflux disease without esophagitis: Secondary | ICD-10-CM | POA: Diagnosis not present

## 2019-11-24 DIAGNOSIS — E1142 Type 2 diabetes mellitus with diabetic polyneuropathy: Secondary | ICD-10-CM | POA: Diagnosis not present

## 2019-11-27 DIAGNOSIS — Z9181 History of falling: Secondary | ICD-10-CM | POA: Diagnosis not present

## 2019-11-27 DIAGNOSIS — E785 Hyperlipidemia, unspecified: Secondary | ICD-10-CM | POA: Diagnosis not present

## 2019-11-27 DIAGNOSIS — Z0000 Encounter for general adult medical examination without abnormal findings: Secondary | ICD-10-CM | POA: Diagnosis not present

## 2020-02-16 ENCOUNTER — Other Ambulatory Visit: Payer: Self-pay | Admitting: Internal Medicine

## 2020-02-16 DIAGNOSIS — M545 Low back pain, unspecified: Secondary | ICD-10-CM

## 2020-02-16 DIAGNOSIS — M25551 Pain in right hip: Secondary | ICD-10-CM

## 2020-02-17 ENCOUNTER — Ambulatory Visit
Admission: RE | Admit: 2020-02-17 | Discharge: 2020-02-17 | Disposition: A | Discharge status: Home / Self Care | Payer: Medicare Other | Source: Ambulatory Visit | Attending: Internal Medicine | Admitting: Internal Medicine

## 2020-02-17 ENCOUNTER — Ambulatory Visit
Admission: RE | Admit: 2020-02-17 | Discharge: 2020-02-17 | Disposition: A | Discharge status: Home / Self Care | Payer: Medicare Other | Attending: Internal Medicine | Admitting: Internal Medicine

## 2020-02-17 DIAGNOSIS — M545 Low back pain, unspecified: Secondary | ICD-10-CM | POA: Insufficient documentation

## 2020-02-17 DIAGNOSIS — M25551 Pain in right hip: Secondary | ICD-10-CM | POA: Insufficient documentation

## 2020-04-26 DIAGNOSIS — M109 Gout, unspecified: Secondary | ICD-10-CM | POA: Diagnosis not present

## 2020-04-26 DIAGNOSIS — E785 Hyperlipidemia, unspecified: Secondary | ICD-10-CM | POA: Diagnosis not present

## 2020-04-26 DIAGNOSIS — J449 Chronic obstructive pulmonary disease, unspecified: Secondary | ICD-10-CM | POA: Diagnosis not present

## 2020-04-26 DIAGNOSIS — K219 Gastro-esophageal reflux disease without esophagitis: Secondary | ICD-10-CM | POA: Diagnosis not present

## 2020-04-26 DIAGNOSIS — Z79899 Other long term (current) drug therapy: Secondary | ICD-10-CM | POA: Diagnosis not present

## 2020-04-26 DIAGNOSIS — J029 Acute pharyngitis, unspecified: Secondary | ICD-10-CM | POA: Diagnosis not present

## 2020-04-26 DIAGNOSIS — E1142 Type 2 diabetes mellitus with diabetic polyneuropathy: Secondary | ICD-10-CM | POA: Diagnosis not present

## 2020-04-26 DIAGNOSIS — G629 Polyneuropathy, unspecified: Secondary | ICD-10-CM | POA: Diagnosis not present

## 2020-07-18 DIAGNOSIS — G629 Polyneuropathy, unspecified: Secondary | ICD-10-CM | POA: Diagnosis not present

## 2020-07-18 DIAGNOSIS — J029 Acute pharyngitis, unspecified: Secondary | ICD-10-CM | POA: Diagnosis not present

## 2020-07-18 DIAGNOSIS — J449 Chronic obstructive pulmonary disease, unspecified: Secondary | ICD-10-CM | POA: Diagnosis not present

## 2020-11-16 ENCOUNTER — Ambulatory Visit
Admission: RE | Admit: 2020-11-16 | Discharge: 2020-11-16 | Disposition: A | Discharge status: Home / Self Care | Payer: Medicare Other | Source: Ambulatory Visit | Attending: Internal Medicine | Admitting: Internal Medicine

## 2020-11-16 ENCOUNTER — Ambulatory Visit
Admission: RE | Admit: 2020-11-16 | Discharge: 2020-11-16 | Disposition: A | Discharge status: Home / Self Care | Payer: Medicare Other | Attending: Internal Medicine | Admitting: Internal Medicine

## 2020-11-16 ENCOUNTER — Other Ambulatory Visit: Payer: Self-pay | Admitting: Internal Medicine

## 2020-11-16 DIAGNOSIS — J986 Disorders of diaphragm: Secondary | ICD-10-CM | POA: Diagnosis not present

## 2020-11-16 DIAGNOSIS — U071 COVID-19: Secondary | ICD-10-CM | POA: Insufficient documentation

## 2020-12-17 ENCOUNTER — Emergency Department: Payer: Medicare Other

## 2020-12-17 ENCOUNTER — Inpatient Hospital Stay
Admission: EM | Admit: 2020-12-17 | Discharge: 2021-01-26 | DRG: 871 | Disposition: A | Discharge status: Skilled Nursing Facility | Payer: Medicare Other | Attending: Obstetrics and Gynecology | Admitting: Obstetrics and Gynecology

## 2020-12-17 ENCOUNTER — Other Ambulatory Visit: Payer: Self-pay

## 2020-12-17 ENCOUNTER — Encounter: Payer: Self-pay | Admitting: Radiology

## 2020-12-17 DIAGNOSIS — R000 Tachycardia, unspecified: Secondary | ICD-10-CM | POA: Diagnosis not present

## 2020-12-17 DIAGNOSIS — J90 Pleural effusion, not elsewhere classified: Secondary | ICD-10-CM

## 2020-12-17 DIAGNOSIS — Z9151 Personal history of suicidal behavior: Secondary | ICD-10-CM

## 2020-12-17 DIAGNOSIS — R109 Unspecified abdominal pain: Secondary | ICD-10-CM

## 2020-12-17 DIAGNOSIS — I272 Pulmonary hypertension, unspecified: Secondary | ICD-10-CM | POA: Diagnosis present

## 2020-12-17 DIAGNOSIS — F1910 Other psychoactive substance abuse, uncomplicated: Secondary | ICD-10-CM | POA: Diagnosis present

## 2020-12-17 DIAGNOSIS — R7989 Other specified abnormal findings of blood chemistry: Secondary | ICD-10-CM | POA: Diagnosis not present

## 2020-12-17 DIAGNOSIS — J690 Pneumonitis due to inhalation of food and vomit: Secondary | ICD-10-CM

## 2020-12-17 DIAGNOSIS — L8990 Pressure ulcer of unspecified site, unspecified stage: Secondary | ICD-10-CM | POA: Insufficient documentation

## 2020-12-17 DIAGNOSIS — Z811 Family history of alcohol abuse and dependence: Secondary | ICD-10-CM

## 2020-12-17 DIAGNOSIS — G9341 Metabolic encephalopathy: Secondary | ICD-10-CM | POA: Diagnosis not present

## 2020-12-17 DIAGNOSIS — E1142 Type 2 diabetes mellitus with diabetic polyneuropathy: Secondary | ICD-10-CM | POA: Diagnosis present

## 2020-12-17 DIAGNOSIS — M109 Gout, unspecified: Secondary | ICD-10-CM | POA: Diagnosis present

## 2020-12-17 DIAGNOSIS — R17 Unspecified jaundice: Secondary | ICD-10-CM | POA: Diagnosis not present

## 2020-12-17 DIAGNOSIS — F3181 Bipolar II disorder: Secondary | ICD-10-CM | POA: Diagnosis not present

## 2020-12-17 DIAGNOSIS — J9811 Atelectasis: Secondary | ICD-10-CM | POA: Diagnosis not present

## 2020-12-17 DIAGNOSIS — I959 Hypotension, unspecified: Secondary | ICD-10-CM | POA: Diagnosis not present

## 2020-12-17 DIAGNOSIS — K709 Alcoholic liver disease, unspecified: Secondary | ICD-10-CM | POA: Diagnosis present

## 2020-12-17 DIAGNOSIS — K7689 Other specified diseases of liver: Secondary | ICD-10-CM | POA: Diagnosis not present

## 2020-12-17 DIAGNOSIS — Z6837 Body mass index (BMI) 37.0-37.9, adult: Secondary | ICD-10-CM | POA: Diagnosis not present

## 2020-12-17 DIAGNOSIS — E871 Hypo-osmolality and hyponatremia: Secondary | ICD-10-CM | POA: Diagnosis not present

## 2020-12-17 DIAGNOSIS — F10231 Alcohol dependence with withdrawal delirium: Secondary | ICD-10-CM | POA: Diagnosis present

## 2020-12-17 DIAGNOSIS — R0600 Dyspnea, unspecified: Secondary | ICD-10-CM | POA: Diagnosis not present

## 2020-12-17 DIAGNOSIS — Z8249 Family history of ischemic heart disease and other diseases of the circulatory system: Secondary | ICD-10-CM

## 2020-12-17 DIAGNOSIS — F1020 Alcohol dependence, uncomplicated: Secondary | ICD-10-CM | POA: Diagnosis present

## 2020-12-17 DIAGNOSIS — K7011 Alcoholic hepatitis with ascites: Secondary | ICD-10-CM | POA: Diagnosis not present

## 2020-12-17 DIAGNOSIS — J45909 Unspecified asthma, uncomplicated: Secondary | ICD-10-CM | POA: Diagnosis present

## 2020-12-17 DIAGNOSIS — R55 Syncope and collapse: Secondary | ICD-10-CM | POA: Diagnosis not present

## 2020-12-17 DIAGNOSIS — F10930 Alcohol use, unspecified with withdrawal, uncomplicated: Secondary | ICD-10-CM | POA: Diagnosis not present

## 2020-12-17 DIAGNOSIS — K766 Portal hypertension: Secondary | ICD-10-CM | POA: Diagnosis present

## 2020-12-17 DIAGNOSIS — K760 Fatty (change of) liver, not elsewhere classified: Secondary | ICD-10-CM | POA: Diagnosis present

## 2020-12-17 DIAGNOSIS — R531 Weakness: Secondary | ICD-10-CM

## 2020-12-17 DIAGNOSIS — S0990XA Unspecified injury of head, initial encounter: Secondary | ICD-10-CM | POA: Diagnosis not present

## 2020-12-17 DIAGNOSIS — E861 Hypovolemia: Secondary | ICD-10-CM | POA: Diagnosis present

## 2020-12-17 DIAGNOSIS — N390 Urinary tract infection, site not specified: Secondary | ICD-10-CM | POA: Diagnosis not present

## 2020-12-17 DIAGNOSIS — D72829 Elevated white blood cell count, unspecified: Secondary | ICD-10-CM

## 2020-12-17 DIAGNOSIS — I517 Cardiomegaly: Secondary | ICD-10-CM | POA: Diagnosis not present

## 2020-12-17 DIAGNOSIS — K7682 Hepatic encephalopathy: Secondary | ICD-10-CM | POA: Diagnosis not present

## 2020-12-17 DIAGNOSIS — K7010 Alcoholic hepatitis without ascites: Secondary | ICD-10-CM | POA: Diagnosis not present

## 2020-12-17 DIAGNOSIS — R52 Pain, unspecified: Secondary | ICD-10-CM

## 2020-12-17 DIAGNOSIS — J9621 Acute and chronic respiratory failure with hypoxia: Secondary | ICD-10-CM | POA: Diagnosis not present

## 2020-12-17 DIAGNOSIS — I81 Portal vein thrombosis: Secondary | ICD-10-CM | POA: Diagnosis not present

## 2020-12-17 DIAGNOSIS — Z23 Encounter for immunization: Secondary | ICD-10-CM

## 2020-12-17 DIAGNOSIS — Z801 Family history of malignant neoplasm of trachea, bronchus and lung: Secondary | ICD-10-CM

## 2020-12-17 DIAGNOSIS — K582 Mixed irritable bowel syndrome: Secondary | ICD-10-CM | POA: Diagnosis not present

## 2020-12-17 DIAGNOSIS — E785 Hyperlipidemia, unspecified: Secondary | ICD-10-CM | POA: Diagnosis present

## 2020-12-17 DIAGNOSIS — E872 Acidosis, unspecified: Secondary | ICD-10-CM | POA: Diagnosis not present

## 2020-12-17 DIAGNOSIS — Z881 Allergy status to other antibiotic agents status: Secondary | ICD-10-CM

## 2020-12-17 DIAGNOSIS — Z882 Allergy status to sulfonamides status: Secondary | ICD-10-CM

## 2020-12-17 DIAGNOSIS — Y92009 Unspecified place in unspecified non-institutional (private) residence as the place of occurrence of the external cause: Secondary | ICD-10-CM

## 2020-12-17 DIAGNOSIS — E875 Hyperkalemia: Secondary | ICD-10-CM | POA: Diagnosis not present

## 2020-12-17 DIAGNOSIS — K7200 Acute and subacute hepatic failure without coma: Secondary | ICD-10-CM | POA: Diagnosis not present

## 2020-12-17 DIAGNOSIS — D638 Anemia in other chronic diseases classified elsewhere: Secondary | ICD-10-CM | POA: Diagnosis not present

## 2020-12-17 DIAGNOSIS — K7460 Unspecified cirrhosis of liver: Secondary | ICD-10-CM | POA: Diagnosis not present

## 2020-12-17 DIAGNOSIS — R079 Chest pain, unspecified: Secondary | ICD-10-CM | POA: Diagnosis not present

## 2020-12-17 DIAGNOSIS — A4102 Sepsis due to Methicillin resistant Staphylococcus aureus: Secondary | ICD-10-CM | POA: Diagnosis not present

## 2020-12-17 DIAGNOSIS — Z886 Allergy status to analgesic agent status: Secondary | ICD-10-CM

## 2020-12-17 DIAGNOSIS — Z833 Family history of diabetes mellitus: Secondary | ICD-10-CM

## 2020-12-17 DIAGNOSIS — R5381 Other malaise: Secondary | ICD-10-CM | POA: Diagnosis not present

## 2020-12-17 DIAGNOSIS — Z8616 Personal history of COVID-19: Secondary | ICD-10-CM

## 2020-12-17 DIAGNOSIS — A419 Sepsis, unspecified organism: Secondary | ICD-10-CM | POA: Diagnosis not present

## 2020-12-17 DIAGNOSIS — R296 Repeated falls: Secondary | ICD-10-CM | POA: Diagnosis present

## 2020-12-17 DIAGNOSIS — R0902 Hypoxemia: Secondary | ICD-10-CM

## 2020-12-17 DIAGNOSIS — I8500 Esophageal varices without bleeding: Secondary | ICD-10-CM | POA: Diagnosis present

## 2020-12-17 DIAGNOSIS — G7102 Facioscapulohumeral muscular dystrophy: Secondary | ICD-10-CM | POA: Diagnosis not present

## 2020-12-17 DIAGNOSIS — E8809 Other disorders of plasma-protein metabolism, not elsewhere classified: Secondary | ICD-10-CM | POA: Diagnosis present

## 2020-12-17 DIAGNOSIS — J181 Lobar pneumonia, unspecified organism: Secondary | ICD-10-CM | POA: Diagnosis not present

## 2020-12-17 DIAGNOSIS — E876 Hypokalemia: Secondary | ICD-10-CM | POA: Diagnosis not present

## 2020-12-17 DIAGNOSIS — G7249 Other inflammatory and immune myopathies, not elsewhere classified: Secondary | ICD-10-CM | POA: Diagnosis not present

## 2020-12-17 DIAGNOSIS — Z79899 Other long term (current) drug therapy: Secondary | ICD-10-CM

## 2020-12-17 DIAGNOSIS — F603 Borderline personality disorder: Secondary | ICD-10-CM | POA: Diagnosis present

## 2020-12-17 DIAGNOSIS — Z20822 Contact with and (suspected) exposure to covid-19: Secondary | ICD-10-CM | POA: Diagnosis not present

## 2020-12-17 DIAGNOSIS — K7031 Alcoholic cirrhosis of liver with ascites: Secondary | ICD-10-CM | POA: Diagnosis present

## 2020-12-17 DIAGNOSIS — R6520 Severe sepsis without septic shock: Secondary | ICD-10-CM | POA: Diagnosis not present

## 2020-12-17 DIAGNOSIS — Z7984 Long term (current) use of oral hypoglycemic drugs: Secondary | ICD-10-CM

## 2020-12-17 DIAGNOSIS — K52839 Microscopic colitis, unspecified: Secondary | ICD-10-CM | POA: Diagnosis present

## 2020-12-17 DIAGNOSIS — R160 Hepatomegaly, not elsewhere classified: Secondary | ICD-10-CM | POA: Diagnosis not present

## 2020-12-17 DIAGNOSIS — R0609 Other forms of dyspnea: Secondary | ICD-10-CM | POA: Diagnosis not present

## 2020-12-17 DIAGNOSIS — Z9181 History of falling: Secondary | ICD-10-CM

## 2020-12-17 DIAGNOSIS — E43 Unspecified severe protein-calorie malnutrition: Secondary | ICD-10-CM | POA: Diagnosis not present

## 2020-12-17 DIAGNOSIS — F17210 Nicotine dependence, cigarettes, uncomplicated: Secondary | ICD-10-CM | POA: Diagnosis present

## 2020-12-17 DIAGNOSIS — Z743 Need for continuous supervision: Secondary | ICD-10-CM | POA: Diagnosis not present

## 2020-12-17 DIAGNOSIS — R188 Other ascites: Secondary | ICD-10-CM

## 2020-12-17 DIAGNOSIS — Z83438 Family history of other disorder of lipoprotein metabolism and other lipidemia: Secondary | ICD-10-CM

## 2020-12-17 DIAGNOSIS — R0602 Shortness of breath: Secondary | ICD-10-CM | POA: Diagnosis not present

## 2020-12-17 DIAGNOSIS — K219 Gastro-esophageal reflux disease without esophagitis: Secondary | ICD-10-CM | POA: Diagnosis present

## 2020-12-17 DIAGNOSIS — E8770 Fluid overload, unspecified: Secondary | ICD-10-CM | POA: Diagnosis not present

## 2020-12-17 DIAGNOSIS — R739 Hyperglycemia, unspecified: Secondary | ICD-10-CM | POA: Diagnosis not present

## 2020-12-17 DIAGNOSIS — D72823 Leukemoid reaction: Secondary | ICD-10-CM | POA: Diagnosis not present

## 2020-12-17 DIAGNOSIS — E1165 Type 2 diabetes mellitus with hyperglycemia: Secondary | ICD-10-CM | POA: Diagnosis present

## 2020-12-17 DIAGNOSIS — Z7401 Bed confinement status: Secondary | ICD-10-CM | POA: Diagnosis not present

## 2020-12-17 DIAGNOSIS — E0781 Sick-euthyroid syndrome: Secondary | ICD-10-CM | POA: Diagnosis present

## 2020-12-17 DIAGNOSIS — F10939 Alcohol use, unspecified with withdrawal, unspecified: Secondary | ICD-10-CM | POA: Diagnosis present

## 2020-12-17 DIAGNOSIS — Z888 Allergy status to other drugs, medicaments and biological substances status: Secondary | ICD-10-CM

## 2020-12-17 DIAGNOSIS — R2689 Other abnormalities of gait and mobility: Secondary | ICD-10-CM | POA: Diagnosis not present

## 2020-12-17 DIAGNOSIS — G9340 Encephalopathy, unspecified: Secondary | ICD-10-CM | POA: Diagnosis not present

## 2020-12-17 DIAGNOSIS — K7040 Alcoholic hepatic failure without coma: Secondary | ICD-10-CM | POA: Diagnosis present

## 2020-12-17 DIAGNOSIS — R6889 Other general symptoms and signs: Secondary | ICD-10-CM | POA: Diagnosis not present

## 2020-12-17 DIAGNOSIS — K5900 Constipation, unspecified: Secondary | ICD-10-CM | POA: Diagnosis not present

## 2020-12-17 DIAGNOSIS — R918 Other nonspecific abnormal finding of lung field: Secondary | ICD-10-CM | POA: Diagnosis not present

## 2020-12-17 DIAGNOSIS — M6281 Muscle weakness (generalized): Secondary | ICD-10-CM | POA: Diagnosis not present

## 2020-12-17 DIAGNOSIS — Z452 Encounter for adjustment and management of vascular access device: Secondary | ICD-10-CM | POA: Diagnosis not present

## 2020-12-17 DIAGNOSIS — Z823 Family history of stroke: Secondary | ICD-10-CM

## 2020-12-17 LAB — CBC
HCT: 39.6 % (ref 36.0–46.0)
Hemoglobin: 13.4 g/dL (ref 12.0–15.0)
MCH: 31.8 pg (ref 26.0–34.0)
MCHC: 33.8 g/dL (ref 30.0–36.0)
MCV: 93.8 fL (ref 80.0–100.0)
Platelets: 132 10*3/uL — ABNORMAL LOW (ref 150–400)
RBC: 4.22 MIL/uL (ref 3.87–5.11)
RDW: 19.5 % — ABNORMAL HIGH (ref 11.5–15.5)
WBC: 11.2 10*3/uL — ABNORMAL HIGH (ref 4.0–10.5)
nRBC: 0.6 % — ABNORMAL HIGH (ref 0.0–0.2)

## 2020-12-17 MED ORDER — LORAZEPAM 2 MG/ML IJ SOLN
1.0000 mg | Freq: Once | INTRAMUSCULAR | Status: AC
  Administered 2020-12-18: 1 mg via INTRAVENOUS
  Filled 2020-12-17: qty 1

## 2020-12-17 MED ORDER — SODIUM CHLORIDE 0.9 % IV BOLUS
1000.0000 mL | Freq: Once | INTRAVENOUS | Status: AC
Start: 2020-12-17 — End: 2020-12-18
  Administered 2020-12-18: 1000 mL via INTRAVENOUS

## 2020-12-17 NOTE — ED Notes (Signed)
Pt placed on oxygen at 2lpm [place]

## 2020-12-17 NOTE — ED Notes (Signed)
IV team at bedside 

## 2020-12-17 NOTE — ED Provider Notes (Signed)
[facility] Emergency Department Provider Note  Time seen: 11:27 PM  I have reviewed the triage vital signs and the nursing notes.   HISTORY  Chief Complaint Alcohol withdrawal, Fall Body aches   HPI [name] is a 45 y.o. female with a past medical history of alcohol abuse, anxiety, diabetes, depression, bipolar, muscular dystrophy, presents to the emergency department with multiple complaints.  According to the patient 2.5 days ago she fell while at home and states she has been too weak to get up and her roommates could not help her get up either.  States she is essentially been lying on the floor, but using adult diapers.  States she is sore all over her body from lying on the floor for 2 days.  States she is not drinking any alcohol since yesterday and drinks heavily on a daily basis.  Believes she is going into withdrawals.  Patient is noted to be hypoxic on room air 88% with no baseline O2 requirement.  Denies any fever or cough.  Denies any vomiting or diarrhea.  Patient does have dried blood around her mouth.  Past Medical History:  Diagnosis Date   Alcohol abuse    Anxiety    Anxiety    Anxiety and depression    Asthma    Bipolar disorder ([name])    Borderline personality disorder ([name])    Depression    Diabetes ([name])    Type 2    Dyslipidemia    Esophageal varices ([name])    Foot drop    right   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[name])    GERD (gastroesophageal reflux disease)    Gout    History of drug abuse ([name])    Insomnia    Muscular dystrophies ([name])    Neuropathy    Pancreatitis    Recovering alcoholic [facility])     Patient Active Problem List   Diagnosis Date Noted   Bipolar 1 disorder, manic, moderate ([name]) 09/10/2018   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   Diabetes ([name]) 09/10/2018   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   Anxiety and depression 10/01/2012   Protein-calorie malnutrition,  severe ([name]) 09/30/2012   Alcohol abuse 09/26/2012   Trichomonas infection 09/26/2012   Dehydration 09/26/2012   Hypokalemia 11/14/2011   Thrombocytopenia ([name]) 11/14/2011   Alcohol withdrawal ([name]) 11/13/2011   Bipolar 2 disorder ([name]) 11/13/2011   Tobacco abuse 11/13/2011   Cocaine abuse ([name]) 11/13/2011   Gout 11/13/2011    Past Surgical History:  Procedure Laterality Date   HERNIA REPAIR      Prior to Admission medications   Medication Sig Start Date End Date Taking? Authorizing Provider  albuterol (VENTOLIN HFA) 108 (90 Base) MCG/ACT inhaler Inhale 2 puffs into the lungs every 6 (six) hours as needed for wheezing. 09/15/18   Clapacs, Jackquline Denmark, MD  atorvastatin (LIPITOR) 40 MG tablet Take 1 tablet (40 mg total) by mouth daily. 09/15/18   Clapacs, Jackquline Denmark, MD  calcium-vitamin D (OSCAL WITH D) 500-200 MG-UNIT tablet Take 1 tablet by mouth daily with breakfast. 09/16/18   Clapacs, Jackquline Denmark, MD  cariprazine (VRAYLAR) capsule Take 1 capsule (3 mg total) by mouth daily. 09/15/18   Clapacs, Jackquline Denmark, MD  clonazePAM (KLONOPIN) 1 MG tablet Take 1 mg by mouth 3 (three) times daily as needed for anxiety.    [provider]  FLUoxetine (PROZAC) 40 MG capsule Take 2 capsules (80 mg total) by mouth daily. 09/15/18   Clapacs, Jonny Ruiz  T, MD  fluticasone (FLONASE) 50 MCG/ACT nasal spray Place 1 spray into both nostrils daily. 09/16/18   Clapacs, Jackquline Denmark, MD  LYRICA 50 MG capsule Take 50 mg by mouth 3 (three) times daily. 10/13/14   [provider]  metFORMIN (GLUCOPHAGE) 500 MG tablet Take 1 tablet (500 mg total) by mouth 2 (two) times daily with a meal. 09/15/18   Clapacs, Jackquline Denmark, MD  Multiple Vitamin (MULTIVITAMIN WITH MINERALS) TABS tablet Take 1 tablet by mouth daily. 09/16/18   Clapacs, Jackquline Denmark, MD  naloxone [facility]) nasal spray 4 mg/0.1 mL Take as needed for opioid overdose 01/22/19   Shaune Pollack, MD  naproxen (NAPROSYN) 250 MG tablet Take 1 tablet (250 mg total) by mouth 3 times/day as  needed-between meals & bedtime for mild pain. 09/15/18   Clapacs, Jackquline Denmark, MD  Norgestimate-Ethinyl Estradiol Triphasic (TRI-SPRINTEC) 0.18/0.215/0.25 MG-35 MCG tablet Take 1 tablet by mouth daily.    [provider]  pantoprazole (PROTONIX) 40 MG tablet Take 1 tablet (40 mg total) by mouth daily. 09/16/18   Clapacs, Jackquline Denmark, MD  topiramate (TOPAMAX) 200 MG tablet Take 1 tablet (200 mg total) by mouth at bedtime. 09/15/18   Clapacs, Jackquline Denmark, MD  zolpidem (AMBIEN) 10 MG tablet Take 10 mg by mouth at bedtime as needed for sleep.    [provider]    Allergies  Allergen Reactions   Erythromycin Anaphylaxis   Sulfa Antibiotics Anaphylaxis   Tylenol [Acetaminophen] Other (See Comments)    Pancreatitis and liver dysfunction, not supposed to take med   Nsaids Other (See Comments)    Causes GI problems, very bad reaction-per patient.    Gabapentin     Mood changes     Family History  Problem Relation Age of Onset   Heart disease Mother    Heart attack Mother    Hyperlipidemia Maternal Grandmother    Diabetes Maternal Grandmother    Hypertension Maternal Grandmother    Hyperlipidemia Maternal Grandfather    Heart disease Maternal Grandfather    Heart attack Maternal Grandfather    Diabetes Maternal Grandfather    Lung cancer Maternal Grandfather    Hypertension Maternal Grandfather    Other Paternal Grandmother        Thyroid problems    Heart disease Paternal Grandfather    Stroke Paternal Grandfather    Hyperlipidemia Paternal Grandfather    Heart attack Paternal Grandfather    Hypertension Paternal Grandfather     Social History Social History   Tobacco Use   Smoking status: Every Day    Packs/day: 0.50    Types: Cigarettes   Smokeless tobacco: Never  Substance Use Topics   Alcohol use: Yes   Drug use: Yes    Types: IV, Cocaine    Review of Systems Constitutional: Denies fever, 99.3 in the emergency department. Cardiovascular: Negative for chest  pain. Respiratory: Negative for shortness of breath.  Negative for cough. Gastrointestinal: Negative for abdominal pain, vomiting and diarrhea. Genitourinary: Negative for urinary compaints Musculoskeletal positive for generalized weakness.  Positive for body aches. Neurological: Negative for headache.  Denies any focal weakness but states generalized weakness. All other ROS negative  ____________________________________________   PHYSICAL EXAM:  VITAL SIGNS: ED Triage Vitals  Enc Vitals Group     BP 12/17/20 2254 132/73     Pulse Rate 12/17/20 2254 89     Resp 12/17/20 2254 16     Temp 12/17/20 2300 99.3 F (37.4 C)  Temp Source 12/17/20 2254 Oral     SpO2 12/17/20 2254 99 %     Weight 12/17/20 2255 200 lb (90.7 kg)     Height 12/17/20 2255 5\' 7"  (1.702 m)     Head Circumference --      Peak Flow --      Pain Score 12/17/20 2254 7     Pain Loc --      Pain Edu? --      Excl. in GC? --    Constitutional: Patient is awake and alert.  She is oriented although her responses are somewhat slowed. Eyes: Normal exam ENT      Head: Normocephalic, no clear trauma however she does have dried blood around her mouth but no obvious signs of trauma.      Mouth/Throat: Somewhat dry appearing mucous membranes. Cardiovascular: Normal rate, regular rhythm.  Respiratory: Normal respiratory effort without tachypnea nor retractions. Breath sounds are clear, without obvious wheeze rales or rhonchi. Gastrointestinal: Soft but obese, overall nontender, dull percussion.  Likely a degree of ascites. Musculoskeletal: Patient can move all extremities with no significant pain however patient has limited movement in all extremities due to weakness. Neurologic:  Normal speech and language.  Is oriented but somewhat slowed responses.  Is able to move all extremities but 3+/5 strength in all extremities.  No focal deficit identified. Skin:  Skin is warm, dry and intact.  Psychiatric: Mood and affect are  normal. Speech and behavior are normal.   ____________________________________________    EKG  EKG viewed and interpreted by myself shows sinus tachycardia 106 bpm with a narrow QRS, normal axis, normal intervals besides slight QTC prolongation, nonspecific ST changes but no ST elevation.  ____________________________________________    RADIOLOGY  Right basilar infiltrate  ____________________________________________   INITIAL IMPRESSION / ASSESSMENT AND PLAN / ED COURSE  Pertinent labs & imaging results that were available during my care of the patient were reviewed by me and considered in my medical decision making (see chart for details).   Patient seen the emergency department after being on the floor for 2.5 days per patient.  Has not entirely clear why she is on the floor, states she could not get herself up and her roommates were too weak to get her up either but they were changing her adult diapers.  He states she did drink alcohol yesterday but none today.  States she does drink on a daily basis and is worried she is going into withdrawals.  Patient has diffuse weakness but also states she was diagnosed with COVID 2 months ago and since then has been using a walker.  Patient does not appear to be in any condition to ambulate on her own, has 3+/5 weakness/strength in all extremities.  No focal deficit identified.  We will check a broad range of labs including CK levels.  We will swab for COVID, she is hypoxic to 88% on arrival we will obtain a chest x-ray given her fall we will also obtain a CT scan of the head.  Given my initial impression of the patient I believe she is likely going to require admission to the hospital once her emergency department work-up is been completed.  Patient's labs have resulted showing sodium 113, potassium of 2.9, elevated LFTs.  Lactate is 2.5.  Chest x-ray shows right basilar infiltrate.  As the patient was on the floor for 2.5 days highly suspect  aspiration pneumonia will cover with Unasyn and send blood cultures.  We will  begin electrolyte replacement.  Patient will require admission to the hospital service for further work-up and treatment.  [name] was evaluated in Emergency Department on 12/17/2020 for the symptoms described in the history of present illness. She was evaluated in the context of the global COVID-19 pandemic, which necessitated consideration that the patient might be at risk for infection with the SARS-CoV-2 virus that causes COVID-19. Institutional protocols and algorithms that pertain to the evaluation of patients at risk for COVID-19 are in a state of rapid change based on information released by regulatory bodies including the CDC and federal and state organizations. These policies and algorithms were followed during the patient's care in the ED.  ____________________________________________   FINAL CLINICAL IMPRESSION(S) / ED DIAGNOSES  Fall Weakness Hyponatremia Hypokalemia   Minna Antis, MD 12/18/20 [phone]

## 2020-12-17 NOTE — ED Triage Notes (Signed)
Pt states has been lying on floor for 4 days and thinks she may be in etoh withdrawal. Pt with history muscular dystrophy. Pt with yellowing noted around eyes. Pt states she does not feel well.

## 2020-12-18 ENCOUNTER — Emergency Department: Payer: Medicare Other

## 2020-12-18 ENCOUNTER — Encounter: Payer: Self-pay | Admitting: Internal Medicine

## 2020-12-18 DIAGNOSIS — E871 Hypo-osmolality and hyponatremia: Secondary | ICD-10-CM | POA: Diagnosis present

## 2020-12-18 DIAGNOSIS — E1142 Type 2 diabetes mellitus with diabetic polyneuropathy: Secondary | ICD-10-CM | POA: Diagnosis present

## 2020-12-18 DIAGNOSIS — A4102 Sepsis due to Methicillin resistant Staphylococcus aureus: Secondary | ICD-10-CM | POA: Diagnosis present

## 2020-12-18 DIAGNOSIS — R079 Chest pain, unspecified: Secondary | ICD-10-CM | POA: Diagnosis not present

## 2020-12-18 DIAGNOSIS — D72829 Elevated white blood cell count, unspecified: Secondary | ICD-10-CM | POA: Diagnosis not present

## 2020-12-18 DIAGNOSIS — F3181 Bipolar II disorder: Secondary | ICD-10-CM | POA: Diagnosis present

## 2020-12-18 DIAGNOSIS — R55 Syncope and collapse: Secondary | ICD-10-CM | POA: Diagnosis not present

## 2020-12-18 DIAGNOSIS — N390 Urinary tract infection, site not specified: Secondary | ICD-10-CM | POA: Diagnosis not present

## 2020-12-18 DIAGNOSIS — R0902 Hypoxemia: Secondary | ICD-10-CM | POA: Diagnosis not present

## 2020-12-18 DIAGNOSIS — G9341 Metabolic encephalopathy: Secondary | ICD-10-CM | POA: Diagnosis not present

## 2020-12-18 DIAGNOSIS — K766 Portal hypertension: Secondary | ICD-10-CM | POA: Diagnosis present

## 2020-12-18 DIAGNOSIS — E43 Unspecified severe protein-calorie malnutrition: Secondary | ICD-10-CM | POA: Diagnosis not present

## 2020-12-18 DIAGNOSIS — K760 Fatty (change of) liver, not elsewhere classified: Secondary | ICD-10-CM | POA: Diagnosis present

## 2020-12-18 DIAGNOSIS — R6520 Severe sepsis without septic shock: Secondary | ICD-10-CM | POA: Diagnosis not present

## 2020-12-18 DIAGNOSIS — R0609 Other forms of dyspnea: Secondary | ICD-10-CM | POA: Diagnosis not present

## 2020-12-18 DIAGNOSIS — R188 Other ascites: Secondary | ICD-10-CM | POA: Diagnosis not present

## 2020-12-18 DIAGNOSIS — R000 Tachycardia, unspecified: Secondary | ICD-10-CM | POA: Diagnosis not present

## 2020-12-18 DIAGNOSIS — E1165 Type 2 diabetes mellitus with hyperglycemia: Secondary | ICD-10-CM | POA: Diagnosis present

## 2020-12-18 DIAGNOSIS — G9340 Encephalopathy, unspecified: Secondary | ICD-10-CM | POA: Diagnosis not present

## 2020-12-18 DIAGNOSIS — R0600 Dyspnea, unspecified: Secondary | ICD-10-CM | POA: Diagnosis not present

## 2020-12-18 DIAGNOSIS — Y92009 Unspecified place in unspecified non-institutional (private) residence as the place of occurrence of the external cause: Secondary | ICD-10-CM | POA: Diagnosis not present

## 2020-12-18 DIAGNOSIS — F1910 Other psychoactive substance abuse, uncomplicated: Secondary | ICD-10-CM | POA: Diagnosis not present

## 2020-12-18 DIAGNOSIS — K709 Alcoholic liver disease, unspecified: Secondary | ICD-10-CM | POA: Diagnosis present

## 2020-12-18 DIAGNOSIS — Z8616 Personal history of COVID-19: Secondary | ICD-10-CM | POA: Diagnosis not present

## 2020-12-18 DIAGNOSIS — I272 Pulmonary hypertension, unspecified: Secondary | ICD-10-CM | POA: Diagnosis present

## 2020-12-18 DIAGNOSIS — J690 Pneumonitis due to inhalation of food and vomit: Secondary | ICD-10-CM | POA: Diagnosis present

## 2020-12-18 DIAGNOSIS — K7040 Alcoholic hepatic failure without coma: Secondary | ICD-10-CM | POA: Diagnosis present

## 2020-12-18 DIAGNOSIS — E876 Hypokalemia: Secondary | ICD-10-CM | POA: Diagnosis not present

## 2020-12-18 DIAGNOSIS — R17 Unspecified jaundice: Secondary | ICD-10-CM | POA: Diagnosis not present

## 2020-12-18 DIAGNOSIS — R109 Unspecified abdominal pain: Secondary | ICD-10-CM | POA: Diagnosis not present

## 2020-12-18 DIAGNOSIS — R531 Weakness: Secondary | ICD-10-CM | POA: Diagnosis not present

## 2020-12-18 DIAGNOSIS — J9621 Acute and chronic respiratory failure with hypoxia: Secondary | ICD-10-CM | POA: Diagnosis present

## 2020-12-18 DIAGNOSIS — J90 Pleural effusion, not elsewhere classified: Secondary | ICD-10-CM | POA: Diagnosis not present

## 2020-12-18 DIAGNOSIS — I8500 Esophageal varices without bleeding: Secondary | ICD-10-CM | POA: Diagnosis present

## 2020-12-18 DIAGNOSIS — J45909 Unspecified asthma, uncomplicated: Secondary | ICD-10-CM | POA: Diagnosis present

## 2020-12-18 DIAGNOSIS — A419 Sepsis, unspecified organism: Secondary | ICD-10-CM | POA: Diagnosis not present

## 2020-12-18 DIAGNOSIS — R7989 Other specified abnormal findings of blood chemistry: Secondary | ICD-10-CM | POA: Diagnosis not present

## 2020-12-18 DIAGNOSIS — J181 Lobar pneumonia, unspecified organism: Secondary | ICD-10-CM | POA: Diagnosis not present

## 2020-12-18 DIAGNOSIS — K7460 Unspecified cirrhosis of liver: Secondary | ICD-10-CM | POA: Diagnosis not present

## 2020-12-18 DIAGNOSIS — R160 Hepatomegaly, not elsewhere classified: Secondary | ICD-10-CM | POA: Diagnosis not present

## 2020-12-18 DIAGNOSIS — Z452 Encounter for adjustment and management of vascular access device: Secondary | ICD-10-CM | POA: Diagnosis not present

## 2020-12-18 DIAGNOSIS — K7200 Acute and subacute hepatic failure without coma: Secondary | ICD-10-CM | POA: Diagnosis not present

## 2020-12-18 DIAGNOSIS — F10231 Alcohol dependence with withdrawal delirium: Secondary | ICD-10-CM | POA: Diagnosis present

## 2020-12-18 DIAGNOSIS — K7689 Other specified diseases of liver: Secondary | ICD-10-CM | POA: Diagnosis not present

## 2020-12-18 DIAGNOSIS — Z20822 Contact with and (suspected) exposure to covid-19: Secondary | ICD-10-CM | POA: Diagnosis present

## 2020-12-18 DIAGNOSIS — R918 Other nonspecific abnormal finding of lung field: Secondary | ICD-10-CM | POA: Diagnosis not present

## 2020-12-18 DIAGNOSIS — Z23 Encounter for immunization: Secondary | ICD-10-CM | POA: Diagnosis not present

## 2020-12-18 DIAGNOSIS — K7682 Hepatic encephalopathy: Secondary | ICD-10-CM | POA: Diagnosis present

## 2020-12-18 DIAGNOSIS — D638 Anemia in other chronic diseases classified elsewhere: Secondary | ICD-10-CM | POA: Diagnosis present

## 2020-12-18 DIAGNOSIS — Z6837 Body mass index (BMI) 37.0-37.9, adult: Secondary | ICD-10-CM | POA: Diagnosis not present

## 2020-12-18 DIAGNOSIS — F1020 Alcohol dependence, uncomplicated: Secondary | ICD-10-CM | POA: Diagnosis present

## 2020-12-18 DIAGNOSIS — F10930 Alcohol use, unspecified with withdrawal, uncomplicated: Secondary | ICD-10-CM | POA: Diagnosis not present

## 2020-12-18 DIAGNOSIS — I81 Portal vein thrombosis: Secondary | ICD-10-CM | POA: Diagnosis not present

## 2020-12-18 DIAGNOSIS — I959 Hypotension, unspecified: Secondary | ICD-10-CM | POA: Diagnosis not present

## 2020-12-18 DIAGNOSIS — E872 Acidosis, unspecified: Secondary | ICD-10-CM | POA: Diagnosis not present

## 2020-12-18 DIAGNOSIS — K7010 Alcoholic hepatitis without ascites: Secondary | ICD-10-CM | POA: Diagnosis not present

## 2020-12-18 DIAGNOSIS — K7011 Alcoholic hepatitis with ascites: Secondary | ICD-10-CM | POA: Diagnosis present

## 2020-12-18 DIAGNOSIS — I517 Cardiomegaly: Secondary | ICD-10-CM | POA: Diagnosis not present

## 2020-12-18 DIAGNOSIS — J9811 Atelectasis: Secondary | ICD-10-CM | POA: Diagnosis not present

## 2020-12-18 LAB — HEPATIC FUNCTION PANEL
ALT: 34 U/L (ref 0–44)
AST: 228 U/L — ABNORMAL HIGH (ref 15–41)
Albumin: <1.5 g/dL — ABNORMAL LOW (ref 3.5–5.0)
Alkaline Phosphatase: 362 U/L — ABNORMAL HIGH (ref 38–126)
Bilirubin, Direct: 7.2 mg/dL — ABNORMAL HIGH (ref 0.0–0.2)
Indirect Bilirubin: 4.5 mg/dL — ABNORMAL HIGH (ref 0.3–0.9)
Total Bilirubin: 11.7 mg/dL — ABNORMAL HIGH (ref 0.3–1.2)
Total Protein: 6.1 g/dL — ABNORMAL LOW (ref 6.5–8.1)

## 2020-12-18 LAB — BASIC METABOLIC PANEL
Anion gap: 14 (ref 5–15)
Anion gap: 12 (ref 5–15)
Anion gap: 12 (ref 5–15)
BUN: 5 mg/dL — ABNORMAL LOW (ref 6–20)
BUN: <5 mg/dL — ABNORMAL LOW (ref 6–20)
BUN: <5 mg/dL — ABNORMAL LOW (ref 6–20)
BUN: <5 mg/dL — ABNORMAL LOW (ref 6–20)
CO2: 38 mmol/L — ABNORMAL HIGH (ref 22–32)
CO2: 38 mmol/L — ABNORMAL HIGH (ref 22–32)
CO2: 37 mmol/L — ABNORMAL HIGH (ref 22–32)
CO2: 38 mmol/L — ABNORMAL HIGH (ref 22–32)
Calcium: 7.5 mg/dL — ABNORMAL LOW (ref 8.9–10.3)
Calcium: 7 mg/dL — ABNORMAL LOW (ref 8.9–10.3)
Calcium: 7.3 mg/dL — ABNORMAL LOW (ref 8.9–10.3)
Calcium: 7 mg/dL — ABNORMAL LOW (ref 8.9–10.3)
Chloride: <65 mmol/L — CL (ref 98–111)
Chloride: 66 mmol/L — ABNORMAL LOW (ref 98–111)
Chloride: 69 mmol/L — ABNORMAL LOW (ref 98–111)
Chloride: 70 mmol/L — ABNORMAL LOW (ref 98–111)
Creatinine, Ser: 0.37 mg/dL — ABNORMAL LOW (ref 0.44–1.00)
Creatinine, Ser: 0.31 mg/dL — ABNORMAL LOW (ref 0.44–1.00)
Creatinine, Ser: <0.3 mg/dL — ABNORMAL LOW (ref 0.44–1.00)
Creatinine, Ser: <0.3 mg/dL — ABNORMAL LOW (ref 0.44–1.00)
GFR, Estimated: >60 mL/min (ref >60)
GFR, Estimated: >60 mL/min (ref >60)
Glucose, Bld: 262 mg/dL — ABNORMAL HIGH (ref 70–99)
Glucose, Bld: 193 mg/dL — ABNORMAL HIGH (ref 70–99)
Glucose, Bld: 182 mg/dL — ABNORMAL HIGH (ref 70–99)
Glucose, Bld: 163 mg/dL — ABNORMAL HIGH (ref 70–99)
Potassium: 2.5 mmol/L — CL (ref 3.5–5.1)
Potassium: 2.4 mmol/L — CL (ref 3.5–5.1)
Potassium: 2.7 mmol/L — CL (ref 3.5–5.1)
Potassium: 2.7 mmol/L — CL (ref 3.5–5.1)
Sodium: 118 mmol/L — CL (ref 135–145)
Sodium: 118 mmol/L — CL (ref 135–145)
Sodium: 118 mmol/L — CL (ref 135–145)
Sodium: 120 mmol/L — ABNORMAL LOW (ref 135–145)

## 2020-12-18 LAB — RESP PANEL BY RT-PCR (FLU A&B, COVID) ARPGX2
Influenza A by PCR: NEGATIVE
Influenza B by PCR: NEGATIVE
SARS Coronavirus 2 by RT PCR: NEGATIVE

## 2020-12-18 LAB — COMPREHENSIVE METABOLIC PANEL
ALT: 39 U/L (ref 0–44)
AST: 283 U/L — ABNORMAL HIGH (ref 15–41)
Albumin: 1.8 g/dL — ABNORMAL LOW (ref 3.5–5.0)
Alkaline Phosphatase: 495 U/L — ABNORMAL HIGH (ref 38–126)
BUN: 6 mg/dL (ref 6–20)
CO2: 33 mmol/L — ABNORMAL HIGH (ref 22–32)
Calcium: 7.6 mg/dL — ABNORMAL LOW (ref 8.9–10.3)
Chloride: <65 mmol/L — CL (ref 98–111)
Creatinine, Ser: <0.3 mg/dL — ABNORMAL LOW (ref 0.44–1.00)
Glucose, Bld: 286 mg/dL — ABNORMAL HIGH (ref 70–99)
Potassium: 2.9 mmol/L — ABNORMAL LOW (ref 3.5–5.1)
Sodium: 113 mmol/L — CL (ref 135–145)
Total Bilirubin: 11.9 mg/dL — ABNORMAL HIGH (ref 0.3–1.2)
Total Protein: 6.7 g/dL (ref 6.5–8.1)

## 2020-12-18 LAB — AMMONIA: Ammonia: 52 umol/L — ABNORMAL HIGH (ref 9–35)

## 2020-12-18 LAB — ETHANOL: Alcohol, Ethyl (B): <10 mg/dL (ref <10)

## 2020-12-18 LAB — CBG MONITORING, ED
Glucose-Capillary: 276 mg/dL — ABNORMAL HIGH (ref 70–99)
Glucose-Capillary: 197 mg/dL — ABNORMAL HIGH (ref 70–99)
Glucose-Capillary: 239 mg/dL — ABNORMAL HIGH (ref 70–99)

## 2020-12-18 LAB — LACTIC ACID, PLASMA
Lactic Acid, Venous: 2.5 mmol/L (ref 0.5–1.9)
Lactic Acid, Venous: 1.7 mmol/L (ref 0.5–1.9)

## 2020-12-18 LAB — CK: Total CK: 153 U/L (ref 38–234)

## 2020-12-18 LAB — HIV ANTIBODY (ROUTINE TESTING W REFLEX): HIV Screen 4th Generation wRfx: NONREACTIVE

## 2020-12-18 LAB — SALICYLATE LEVEL: Salicylate Lvl: <7 mg/dL — ABNORMAL LOW (ref 7.0–30.0)

## 2020-12-18 LAB — ACETAMINOPHEN LEVEL: Acetaminophen (Tylenol), Serum: <10 ug/mL — ABNORMAL LOW (ref 10–30)

## 2020-12-18 MED ORDER — SODIUM CHLORIDE 0.9 % IV SOLN
INTRAVENOUS | Status: DC | PRN
  Administered 2020-12-18: 250 mL via INTRAVENOUS
  Administered 2020-12-29: 1000 mL via INTRAVENOUS

## 2020-12-18 MED ORDER — INSULIN ASPART 100 UNIT/ML IJ SOLN
0.0000 [IU] | Freq: Every day | INTRAMUSCULAR | Status: DC
  Administered 2020-12-24: 2 [IU] via SUBCUTANEOUS
  Administered 2020-12-25 – 2020-12-26 (×2): 3 [IU] via SUBCUTANEOUS
  Administered 2020-12-27: 2 [IU] via SUBCUTANEOUS
  Administered 2020-12-31: 3 [IU] via SUBCUTANEOUS
  Administered 2021-01-01 – 2021-01-10 (×10): 2 [IU] via SUBCUTANEOUS
  Administered 2021-01-11: 5 [IU] via SUBCUTANEOUS
  Administered 2021-01-12: 4 [IU] via SUBCUTANEOUS
  Administered 2021-01-13: 3 [IU] via SUBCUTANEOUS
  Administered 2021-01-15 – 2021-01-18 (×4): 2 [IU] via SUBCUTANEOUS
  Administered 2021-01-21 – 2021-01-22 (×2): 3 [IU] via SUBCUTANEOUS
  Filled 2020-12-18 (×24): qty 1

## 2020-12-18 MED ORDER — LORAZEPAM 2 MG PO TABS
0.0000 mg | ORAL_TABLET | Freq: Two times a day (BID) | ORAL | Status: AC
  Administered 2020-12-20: 1 mg via ORAL
  Administered 2020-12-21: 2 mg via ORAL
  Filled 2020-12-18 (×2): qty 1

## 2020-12-18 MED ORDER — ACETAMINOPHEN 325 MG PO TABS
650.0000 mg | ORAL_TABLET | Freq: Four times a day (QID) | ORAL | Status: DC | PRN
  Administered 2020-12-20: 650 mg via ORAL
  Filled 2020-12-18: qty 2

## 2020-12-18 MED ORDER — ENOXAPARIN SODIUM 60 MG/0.6ML IJ SOSY
0.5000 mg/kg | PREFILLED_SYRINGE | INTRAMUSCULAR | Status: DC
  Administered 2020-12-18 – 2020-12-24 (×5): 45 mg via SUBCUTANEOUS
  Filled 2020-12-18 (×6): qty 0.45
  Filled 2020-12-18: qty 0.6
  Filled 2020-12-18: qty 0.45

## 2020-12-18 MED ORDER — POTASSIUM CHLORIDE IN NACL 20-0.9 MEQ/L-% IV SOLN
INTRAVENOUS | Status: DC
  Filled 2020-12-18 (×7): qty 1000

## 2020-12-18 MED ORDER — INSULIN ASPART 100 UNIT/ML IJ SOLN
0.0000 [IU] | Freq: Three times a day (TID) | INTRAMUSCULAR | Status: DC
  Administered 2020-12-18: 4 [IU] via SUBCUTANEOUS
  Administered 2020-12-18: 7 [IU] via SUBCUTANEOUS
  Administered 2020-12-18: 11 [IU] via SUBCUTANEOUS
  Administered 2020-12-19: 7 [IU] via SUBCUTANEOUS
  Administered 2020-12-19 – 2020-12-20 (×3): 4 [IU] via SUBCUTANEOUS
  Administered 2020-12-20: 7 [IU] via SUBCUTANEOUS
  Administered 2020-12-20: 3 [IU] via SUBCUTANEOUS
  Administered 2020-12-21: 4 [IU] via SUBCUTANEOUS
  Administered 2020-12-21 (×2): 3 [IU] via SUBCUTANEOUS
  Administered 2020-12-22: 7 [IU] via SUBCUTANEOUS
  Administered 2020-12-22: 4 [IU] via SUBCUTANEOUS
  Administered 2020-12-22 – 2020-12-23 (×2): 7 [IU] via SUBCUTANEOUS
  Administered 2020-12-23: 4 [IU] via SUBCUTANEOUS
  Administered 2020-12-23: 7 [IU] via SUBCUTANEOUS
  Administered 2020-12-24: 11 [IU] via SUBCUTANEOUS
  Administered 2020-12-24: 7 [IU] via SUBCUTANEOUS
  Administered 2020-12-24: 4 [IU] via SUBCUTANEOUS
  Administered 2020-12-25: 7 [IU] via SUBCUTANEOUS
  Administered 2020-12-25: 15 [IU] via SUBCUTANEOUS
  Administered 2020-12-25: 7 [IU] via SUBCUTANEOUS
  Administered 2020-12-26 (×3): 4 [IU] via SUBCUTANEOUS
  Administered 2020-12-27 – 2020-12-28 (×4): 7 [IU] via SUBCUTANEOUS
  Administered 2020-12-28: 3 [IU] via SUBCUTANEOUS
  Administered 2020-12-28: 11 [IU] via SUBCUTANEOUS
  Administered 2020-12-29: 4 [IU] via SUBCUTANEOUS
  Administered 2020-12-29: 7 [IU] via SUBCUTANEOUS
  Administered 2020-12-29 – 2020-12-30 (×2): 4 [IU] via SUBCUTANEOUS
  Administered 2020-12-30: 7 [IU] via SUBCUTANEOUS
  Administered 2020-12-30: 2 [IU] via SUBCUTANEOUS
  Administered 2020-12-31: 4 [IU] via SUBCUTANEOUS
  Administered 2020-12-31: 11 [IU] via SUBCUTANEOUS
  Administered 2020-12-31 – 2021-01-01 (×4): 4 [IU] via SUBCUTANEOUS
  Administered 2021-01-02: 11 [IU] via SUBCUTANEOUS
  Administered 2021-01-02: 7 [IU] via SUBCUTANEOUS
  Administered 2021-01-02: 4 [IU] via SUBCUTANEOUS
  Administered 2021-01-03: 7 [IU] via SUBCUTANEOUS
  Administered 2021-01-03: 4 [IU] via SUBCUTANEOUS
  Administered 2021-01-03 – 2021-01-04 (×2): 11 [IU] via SUBCUTANEOUS
  Administered 2021-01-04 – 2021-01-05 (×3): 4 [IU] via SUBCUTANEOUS
  Administered 2021-01-05: 7 [IU] via SUBCUTANEOUS
  Administered 2021-01-05: 4 [IU] via SUBCUTANEOUS
  Administered 2021-01-06: 15 [IU] via SUBCUTANEOUS
  Administered 2021-01-06: 7 [IU] via SUBCUTANEOUS
  Administered 2021-01-06 – 2021-01-07 (×2): 4 [IU] via SUBCUTANEOUS
  Administered 2021-01-07 (×2): 15 [IU] via SUBCUTANEOUS
  Administered 2021-01-08: 11 [IU] via SUBCUTANEOUS
  Administered 2021-01-08 – 2021-01-09 (×4): 7 [IU] via SUBCUTANEOUS
  Administered 2021-01-09: 4 [IU] via SUBCUTANEOUS
  Administered 2021-01-10: 7 [IU] via SUBCUTANEOUS
  Administered 2021-01-10 (×2): 11 [IU] via SUBCUTANEOUS
  Administered 2021-01-11: 20 [IU] via SUBCUTANEOUS
  Administered 2021-01-11: 7 [IU] via SUBCUTANEOUS
  Administered 2021-01-11 – 2021-01-12 (×2): 11 [IU] via SUBCUTANEOUS
  Administered 2021-01-12: 4 [IU] via SUBCUTANEOUS
  Administered 2021-01-12: 11 [IU] via SUBCUTANEOUS
  Administered 2021-01-13: 7 [IU] via SUBCUTANEOUS
  Administered 2021-01-13: 4 [IU] via SUBCUTANEOUS
  Administered 2021-01-13 – 2021-01-14 (×2): 7 [IU] via SUBCUTANEOUS
  Administered 2021-01-14: 3 [IU] via SUBCUTANEOUS
  Administered 2021-01-14: 11 [IU] via SUBCUTANEOUS
  Administered 2021-01-15: 20 [IU] via SUBCUTANEOUS
  Administered 2021-01-15: 7 [IU] via SUBCUTANEOUS
  Administered 2021-01-15: 11 [IU] via SUBCUTANEOUS
  Administered 2021-01-16: 7 [IU] via SUBCUTANEOUS
  Administered 2021-01-16: 4 [IU] via SUBCUTANEOUS
  Administered 2021-01-16: 7 [IU] via SUBCUTANEOUS
  Administered 2021-01-17: 11 [IU] via SUBCUTANEOUS
  Administered 2021-01-17: 4 [IU] via SUBCUTANEOUS
  Administered 2021-01-18: 7 [IU] via SUBCUTANEOUS
  Administered 2021-01-18: 4 [IU] via SUBCUTANEOUS
  Administered 2021-01-18: 11 [IU] via SUBCUTANEOUS
  Administered 2021-01-19: 4 [IU] via SUBCUTANEOUS
  Administered 2021-01-19: 7 [IU] via SUBCUTANEOUS
  Administered 2021-01-20: 11 [IU] via SUBCUTANEOUS
  Administered 2021-01-20: 7 [IU] via SUBCUTANEOUS
  Administered 2021-01-21: 4 [IU] via SUBCUTANEOUS
  Administered 2021-01-21: 3 [IU] via SUBCUTANEOUS
  Administered 2021-01-22: 4 [IU] via SUBCUTANEOUS
  Administered 2021-01-22: 7 [IU] via SUBCUTANEOUS
  Administered 2021-01-23: 4 [IU] via SUBCUTANEOUS
  Administered 2021-01-23: 11 [IU] via SUBCUTANEOUS
  Administered 2021-01-23 – 2021-01-24 (×4): 4 [IU] via SUBCUTANEOUS
  Administered 2021-01-25: 7 [IU] via SUBCUTANEOUS
  Administered 2021-01-25: 3 [IU] via SUBCUTANEOUS
  Administered 2021-01-25: 7 [IU] via SUBCUTANEOUS
  Administered 2021-01-26: 3 [IU] via SUBCUTANEOUS
  Filled 2020-12-18 (×108): qty 1

## 2020-12-18 MED ORDER — POTASSIUM CHLORIDE 10 MEQ/100ML IV SOLN
10.0000 meq | INTRAVENOUS | Status: AC
  Administered 2020-12-18 (×5): 10 meq via INTRAVENOUS
  Filled 2020-12-18: qty 100

## 2020-12-18 MED ORDER — ONDANSETRON HCL 4 MG/2ML IJ SOLN
4.0000 mg | Freq: Four times a day (QID) | INTRAMUSCULAR | Status: DC | PRN
  Administered 2020-12-19 – 2021-01-24 (×5): 4 mg via INTRAVENOUS
  Filled 2020-12-18 (×5): qty 2

## 2020-12-18 MED ORDER — POTASSIUM CHLORIDE 10 MEQ/100ML IV SOLN
10.0000 meq | Freq: Once | INTRAVENOUS | Status: AC
  Administered 2020-12-18: 10 meq via INTRAVENOUS
  Filled 2020-12-18: qty 100

## 2020-12-18 MED ORDER — LORAZEPAM 2 MG/ML IJ SOLN: 0.0000 mg | Freq: Two times a day (BID) | INTRAMUSCULAR | Status: AC

## 2020-12-18 MED ORDER — TRAMADOL HCL 50 MG PO TABS
50.0000 mg | ORAL_TABLET | Freq: Four times a day (QID) | ORAL | Status: DC | PRN
Start: 2020-12-18 — End: 2020-12-20
  Administered 2020-12-18 – 2020-12-19 (×3): 50 mg via ORAL
  Filled 2020-12-18 (×4): qty 1

## 2020-12-18 MED ORDER — SODIUM CHLORIDE 0.9 % IV SOLN
3.0000 g | Freq: Once | INTRAVENOUS | Status: AC
  Administered 2020-12-18: 3 g via INTRAVENOUS
  Filled 2020-12-18: qty 8

## 2020-12-18 MED ORDER — LORAZEPAM 2 MG PO TABS
0.0000 mg | ORAL_TABLET | Freq: Four times a day (QID) | ORAL | Status: AC
  Administered 2020-12-19: 1 mg via ORAL
  Filled 2020-12-18: qty 1

## 2020-12-18 MED ORDER — SODIUM CHLORIDE 0.9 % IV SOLN
3.0000 g | Freq: Four times a day (QID) | INTRAVENOUS | Status: DC
  Administered 2020-12-18 – 2020-12-20 (×8): 3 g via INTRAVENOUS
  Filled 2020-12-18 (×5): qty 8
  Filled 2020-12-18 (×2): qty 3
  Filled 2020-12-18 (×5): qty 8
  Filled 2020-12-18: qty 3
  Filled 2020-12-18 (×2): qty 8

## 2020-12-18 MED ORDER — SODIUM CHLORIDE 0.9 % IV SOLN: Freq: Once | INTRAVENOUS | Status: AC

## 2020-12-18 MED ORDER — THIAMINE HCL 100 MG/ML IJ SOLN
100.0000 mg | Freq: Every day | INTRAMUSCULAR | Status: DC
  Administered 2020-12-18: 100 mg via INTRAVENOUS

## 2020-12-18 MED ORDER — SODIUM CHLORIDE 0.9 % IV SOLN: INTRAVENOUS | Status: DC

## 2020-12-18 MED ORDER — ONDANSETRON HCL 4 MG PO TABS: 4.0000 mg | ORAL_TABLET | Freq: Four times a day (QID) | ORAL | Status: DC | PRN

## 2020-12-18 MED ORDER — LORAZEPAM 2 MG/ML IJ SOLN
0.0000 mg | Freq: Four times a day (QID) | INTRAMUSCULAR | Status: AC
  Administered 2020-12-18: 2 mg via INTRAVENOUS
  Filled 2020-12-18: qty 1

## 2020-12-18 MED ORDER — ACETAMINOPHEN 650 MG RE SUPP
650.0000 mg | Freq: Four times a day (QID) | RECTAL | Status: DC | PRN
  Filled 2020-12-18: qty 1

## 2020-12-18 MED ORDER — THIAMINE HCL 100 MG PO TABS
100.0000 mg | ORAL_TABLET | Freq: Every day | ORAL | Status: DC
  Administered 2020-12-19: 100 mg via ORAL
  Filled 2020-12-18: qty 1

## 2020-12-18 NOTE — H&P (Signed)
History and Physical    LENOX LADOUCEUR ION:[id number] DOB: Oct 27, 1975 DOA: 12/17/2020  PCP: Janine Limbo, PA-C   Patient coming from: home  I have personally briefly reviewed patient's old medical records in [place]  Chief Complaint: fall, weakness  HPI: [name] is a 45 y.o. female with medical history significant for Bipolar disorder, diabetes, alcohol use disorder and polysubstance abuse, FSH muscular dystrophy, with history of falls, last seen by neurology in January 2020, who was brought in by EMS after she fell at home 2 days prior and was unable to get up   Patient s denies loss of consciousness or hurting herself when she fell.  States that she hurts all over but denies back pain.  Has numbness in her toes that she states is typical for her for her peripheral neuropathy.  Continues to feel very weak in her legs and states that it is becoming increasingly difficult for her to get around in the house.  States she had been drinking vodka and beer for the first day while on the floor.  ED course: On arrival, low-grade temperature of 99.3 with otherwise normal vitals Blood work significant for sodium of 113, potassium 2.9, glucose 283, AST 283, alk phos 495 and total bilirubin 11.9.  Ammonia 52, alcohol less than 10, CK1 53.  WBC 11.2  EKG, personally viewed and interpreted: Sinus tachycardia at 106 with prolonged QT interval  Imaging: Chest x-ray new right basilar infiltrate and effusion CT head no acute intracranial findings RUQ sonogram: Hepatic steatosis and small amount of right upper quadrant ascites.  No gallstones, no sonographic Murphy sign, CBD diameter 2.3 mm  Patient given a fluid bolus, IV potassium repletion, hospitalist consulted for admission.  Review of Systems: As per HPI otherwise all other systems on review of systems negative.    Past Medical History:  Diagnosis Date   Alcohol abuse    Anxiety    Anxiety    Anxiety and depression    Asthma     Bipolar disorder ([place])    Borderline personality disorder ([place])    Depression    Diabetes ([place])    Type 2    Dyslipidemia    Esophageal varices ([name])    Foot drop    right   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[name])    GERD (gastroesophageal reflux disease)    Gout    History of drug abuse ([place])    Insomnia    Muscular dystrophies ([place])    Neuropathy    Pancreatitis    Recovering alcoholic ([place])     Past Surgical History:  Procedure Laterality Date   HERNIA REPAIR       reports that she has been smoking. She has been smoking an average of .5 packs per day. She has never used smokeless tobacco. She reports current alcohol use. She reports current drug use. Drugs: IV and Cocaine.  Allergies  Allergen Reactions   Erythromycin Anaphylaxis   Sulfa Antibiotics Anaphylaxis   Tylenol [Acetaminophen] Other (See Comments)    Pancreatitis and liver dysfunction, not supposed to take med   Nsaids Other (See Comments)    Causes GI problems, very bad reaction-per patient.    Gabapentin     Mood changes     Family History  Problem Relation Age of Onset   Heart disease Mother    Heart attack Mother    Hyperlipidemia Maternal Grandmother    Diabetes Maternal Grandmother    Hypertension Maternal Grandmother  Hyperlipidemia Maternal Grandfather    Heart disease Maternal Grandfather    Heart attack Maternal Grandfather    Diabetes Maternal Grandfather    Lung cancer Maternal Grandfather    Hypertension Maternal Grandfather    Other Paternal Grandmother        Thyroid problems    Heart disease Paternal Grandfather    Stroke Paternal Grandfather    Hyperlipidemia Paternal Grandfather    Heart attack Paternal Grandfather    Hypertension Paternal Grandfather       Prior to Admission medications   Medication Sig Start Date End Date Taking? Authorizing Provider  albuterol (VENTOLIN HFA) 108 (90 Base) MCG/ACT inhaler Inhale 2 puffs into the lungs every 6 (six) hours as  needed for wheezing. 09/15/18   Clapacs, Madie Reno, MD  atorvastatin (LIPITOR) 40 MG tablet Take 1 tablet (40 mg total) by mouth daily. 09/15/18   Clapacs, Madie Reno, MD  calcium-vitamin D (OSCAL WITH D) 500-200 MG-UNIT tablet Take 1 tablet by mouth daily with breakfast. 09/16/18   Clapacs, Madie Reno, MD  cariprazine (VRAYLAR) capsule Take 1 capsule (3 mg total) by mouth daily. 09/15/18   Clapacs, Madie Reno, MD  clonazePAM (KLONOPIN) 1 MG tablet Take 1 mg by mouth 3 (three) times daily as needed for anxiety.    [provider]  FLUoxetine (PROZAC) 40 MG capsule Take 2 capsules (80 mg total) by mouth daily. 09/15/18   Clapacs, Madie Reno, MD  fluticasone (FLONASE) 50 MCG/ACT nasal spray Place 1 spray into both nostrils daily. 09/16/18   Clapacs, Madie Reno, MD  LYRICA 50 MG capsule Take 50 mg by mouth 3 (three) times daily. 10/13/14   [provider]  metFORMIN (GLUCOPHAGE) 500 MG tablet Take 1 tablet (500 mg total) by mouth 2 (two) times daily with a meal. 09/15/18   Clapacs, Madie Reno, MD  Multiple Vitamin (MULTIVITAMIN WITH MINERALS) TABS tablet Take 1 tablet by mouth daily. 09/16/18   Clapacs, Madie Reno, MD  naloxone [facility]) nasal spray 4 mg/0.1 mL Take as needed for opioid overdose 01/22/19   Duffy Bruce, MD  naproxen (NAPROSYN) 250 MG tablet Take 1 tablet (250 mg total) by mouth 3 times/day as needed-between meals & bedtime for mild pain. 09/15/18   Clapacs, Madie Reno, MD  Norgestimate-Ethinyl Estradiol Triphasic (TRI-SPRINTEC) 0.18/0.215/0.25 MG-35 MCG tablet Take 1 tablet by mouth daily.    [provider]  pantoprazole (PROTONIX) 40 MG tablet Take 1 tablet (40 mg total) by mouth daily. 09/16/18   Clapacs, Madie Reno, MD  topiramate (TOPAMAX) 200 MG tablet Take 1 tablet (200 mg total) by mouth at bedtime. 09/15/18   Clapacs, Madie Reno, MD  zolpidem (AMBIEN) 10 MG tablet Take 10 mg by mouth at bedtime as needed for sleep.    [provider]    Physical Exam: Vitals:   12/18/20 0200 12/18/20 0230  12/18/20 0300 12/18/20 0315  BP: 128/76 129/72 128/71 128/71  Pulse: (!) 101 (!) 107 100 (!) 107  Resp: 20 (!) 31 16   Temp:      TempSrc:      SpO2: 94% 94% 94%   Weight:      Height:         Vitals:   12/18/20 0200 12/18/20 0230 12/18/20 0300 12/18/20 0315  BP: 128/76 129/72 128/71 128/71  Pulse: (!) 101 (!) 107 100 (!) 107  Resp: 20 (!) 31 16   Temp:      TempSrc:      SpO2: 94% 94%  94%   Weight:      Height:        Constitutional: Lethargic, oriented x 3 . Not in any apparent distress.  Appears jaundiced HEENT:      Head: Normocephalic and atraumatic.         Eyes: PERLA, EOMI, Conjunctivae are normal. Sclera is icteric.       Mouth/Throat: Mucous membranes are moist.       Neck: Supple with no signs of meningismus. Cardiovascular: Regular rate and rhythm. No murmurs, gallops, or rubs. 2+ symmetrical distal pulses are present . No JVD. No LE edema Respiratory: Respiratory effort normal .Lungs sounds clear bilaterally. No wheezes, crackles, or rhonchi.  Gastrointestinal: Soft, non tender, appears somewhat distended, positive bowel sounds.  Genitourinary: No CVA tenderness. Musculoskeletal: Nontender with normal range of motion in all extremities. No cyanosis, or erythema of extremities. Neurologic:  Face is symmetric. Moving all extremities. No gross focal neurologic deficits . Skin: Skin is warm, dry.  No rash or ulcers Psychiatric: Mood and affect are normal    Labs on Admission: I have personally reviewed following labs and imaging studies  CBC: Recent Labs  Lab 12/17/20 2335  WBC 11.2*  HGB 13.4  HCT 39.6  MCV 93.8  PLT 681*   Basic Metabolic Panel: Recent Labs  Lab 12/17/20 2335  NA 113*  K 2.9*  CL <65*  CO2 33*  GLUCOSE 286*  BUN 6  CREATININE <0.30*  CALCIUM 7.6*   GFR: CrCl cannot be calculated (This lab value cannot be used to calculate CrCl because it is not a number: <0.30). Liver Function Tests: Recent Labs  Lab 12/17/20 2335  AST  283*  ALT 39  ALKPHOS 495*  BILITOT 11.9*  PROT 6.7  ALBUMIN 1.8*   No results for input(s): LIPASE, AMYLASE in the last 168 hours. Recent Labs  Lab 12/17/20 2335  AMMONIA 52*   Coagulation Profile: No results for input(s): INR, PROTIME in the last 168 hours. Cardiac Enzymes: Recent Labs  Lab 12/17/20 2335  CKTOTAL 153   BNP (last 3 results) No results for input(s): PROBNP in the last 8760 hours. HbA1C: No results for input(s): HGBA1C in the last 72 hours. CBG: No results for input(s): GLUCAP in the last 168 hours. Lipid Profile: No results for input(s): CHOL, HDL, LDLCALC, TRIG, CHOLHDL, LDLDIRECT in the last 72 hours. Thyroid Function Tests: No results for input(s): TSH, T4TOTAL, FREET4, T3FREE, THYROIDAB in the last 72 hours. Anemia Panel: No results for input(s): VITAMINB12, FOLATE, FERRITIN, TIBC, IRON, RETICCTPCT in the last 72 hours. Urine analysis:    Component Value Date/Time   COLORURINE YELLOW 11/18/2014 1520   APPEARANCEUR CLOUDY (A) 11/18/2014 1520   LABSPEC 1.019 11/18/2014 1520   PHURINE 6.0 11/18/2014 1520   GLUCOSEU NEGATIVE 11/18/2014 1520   HGBUR NEGATIVE 11/18/2014 1520   BILIRUBINUR NEGATIVE 11/18/2014 1520   KETONESUR NEGATIVE 11/18/2014 1520   PROTEINUR NEGATIVE 11/18/2014 1520   UROBILINOGEN 0.2 11/18/2014 1520   NITRITE NEGATIVE 11/18/2014 1520   LEUKOCYTESUR MODERATE (A) 11/18/2014 1520    Radiological Exams on Admission: CT HEAD WO CONTRAST (5MM)  Result Date: 12/17/2020 CLINICAL DATA:  Patient found down. EXAM: CT HEAD WITHOUT CONTRAST TECHNIQUE: Contiguous axial images were obtained from the base of the skull through the vertex without intravenous contrast. COMPARISON:  None. FINDINGS: Brain: No evidence of acute infarction, hemorrhage, hydrocephalus, extra-axial collection or mass lesion/mass effect. Vascular: No hyperdense vessel or unexpected calcification. Skull: Normal. Negative for fracture or focal lesion.  Sinuses/Orbits: No  acute finding. Other: None. IMPRESSION: No acute intracranial pathology. Electronically Signed   By: Virgina Norfolk M.D.   On: 12/17/2020 23:51   DG Chest Portable 1 View  Result Date: 12/17/2020 CLINICAL DATA:  Hypoxia EXAM: PORTABLE CHEST 1 VIEW COMPARISON:  11/16/2020 FINDINGS: Cardiac shadow is stable. Left lung remains clear. Elevation of the right hemidiaphragm is noted. Right basilar effusion and underlying atelectasis/infiltrate is present. IMPRESSION: New right basilar infiltrate and effusion. Electronically Signed   By: Inez Catalina M.D.   On: 12/17/2020 23:42   US ABDOMEN LIMITED RUQ (LIVER/GB)  Result Date: 12/18/2020 CLINICAL DATA:  Elevated liver function tests. EXAM: ULTRASOUND ABDOMEN LIMITED RIGHT UPPER QUADRANT COMPARISON:  None. FINDINGS: Gallbladder: No gallstones or wall thickening visualized (2.7 mm). No sonographic Murphy sign noted by sonographer. Common bile duct: Diameter: 2.3 mm Liver: No focal lesion identified. Diffusely increased echogenicity of the liver parenchyma is noted. Portal vein is patent on color Doppler imaging with normal direction of blood flow towards the liver. Other: A small amount of ascites is seen within the right upper quadrant. IMPRESSION: 1. Hepatic steatosis. 2. Small amount of right upper quadrant ascites. Electronically Signed   By: Virgina Norfolk M.D.   On: 12/18/2020 02:03     Assessment/Plan 45 year old female with history of bipolar disorder, diabetes, alcohol use disorder and polysubstance abuse, FSH muscular dystrophy, presenting with a fall  Aspiration pneumonia, Possible sepsis - Patient does have low-grade temperature of 99.3 with mild leukocytosis of 11,000, with lactic acid 2.5 right lower lobe infiltrate on x-ray - IV hydration - Unasyn  Hyponatremia, suspect acute -Suspect hypovolemic and in part related to chronic alcohol use - IV hydration with NS and monitor    Hypokalemia - Received IV supplementation in the ED -  Continue to monitor    Hyperglycemia due to type 2 diabetes mellitus ([name]) - Blood sugar 286 - Sliding scale insulin coverage    Alcoholic liver disease/hepatic steatosis with ascites Hepatic steatosis/hyperbilirubinemia/hyperammonemia -AST/ALT T2 83/39 with alk phos 495 and total bili 11.9 - Ammonia 52 -RUQ sonogram: Hepatic steatosis and small amount of right upper quadrant ascites.  No gallstones, no sonographic Murphy sign, CBD diameter 2.3 mm - Consider GI consult  Independence muscular dystrophy Fall at home in the setting of frequent falls - No external evidence of acute injury - Physical therapy evaluation - No evidence of rhabdo.  CK WNL    Alcohol use disorder, moderate, dependence ([place]) - CIWA withdrawal protocol    Polysubstance abuse ([place]) - Follow UDS    Bipolar 2 disorder ([place]) - Continue home meds pending med rec    DVT prophylaxis: Lovenox  Code Status: full code  Family Communication:  none  Disposition Plan: Back to previous home environment Consults called: none  Status:At the time of admission, it appears that the appropriate admission status for this patient is INPATIENT. This is judged to be reasonable and necessary in order to provide the required intensity of service to ensure the patient's safety given the presenting symptoms, physical exam findings, and initial radiographic and laboratory data in the context of their  Comorbid conditions.   Patient requires inpatient status due to high intensity of service, high risk for further deterioration and high frequency of surveillance required.   I certify that at the point of admission it is my clinical judgment that the patient will require inpatient hospital care spanning beyond [place] MD [facility]  12/18/2020, 3:59 AM

## 2020-12-18 NOTE — Progress Notes (Signed)
Anticoagulation monitoring(Lovenox):  45 yo female ordered Lovenox 40 mg Q24h    Filed Weights   12/17/20 2255  Weight: 90.7 kg (200 lb)   BMI 31.32   Lab Results  Component Value Date   CREATININE <0.30 (L) 12/17/2020   CREATININE 0.68 01/22/2019   CREATININE 0.55 09/09/2018   CrCl cannot be calculated (This lab value cannot be used to calculate CrCl because it is not a number: <0.30). Hemoglobin & Hematocrit     Component Value Date/Time   HGB 13.4 12/17/2020 2335   HCT 39.6 12/17/2020 2335     Per Protocol for Patient with estCrcl > 30 ml/min and BMI > 30, will transition to Lovenox 45 mg Q24h.

## 2020-12-18 NOTE — ED Notes (Signed)
Patient reports "I peed in my adult diaper."  Patient clean and new brief placed.  PureWick applied for patient comfort

## 2020-12-18 NOTE — Progress Notes (Signed)
PROGRESS NOTE    [name]  KWI:[id number] DOB: [birth year] DOA: 12/17/2020 PCP: Janine Limbo, PA-C    Brief Narrative:  [name] is a 45 y.o. female with medical history significant for Bipolar disorder, diabetes, alcohol use disorder and polysubstance abuse, FSH muscular dystrophy, with history of falls, last seen by neurology in January 2020, who was brought in by EMS after she fell at home 2 days prior and was unable to get up. Upon arriving the hospital, patient was found to have severe hyponatremia of 113, potassium 2.9.   She also has an ammonia of 52, bilirubin of 11.9. She was given potassium, she was also placed on normal saline. Her chest x-ray also showed a right lower lobe infiltrates, probable aspiration pneumonia.  She is started on antibiotics with Unasyn.   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 Aspiration pneumonia. Patient came into the hospital with leukocytosis, low-grade fever, significant tachycardia.  Met sepsis criteria. Her chest x-ray also showed right lower lobe infiltrates consistent with aspiration pneumonia.  Patient chronically drinking alcohol, she does has a risk of aspiration. Pending culture results, continue antibiotics with Unasyn.  Severe hyponatremia. Hypokalemia. Patient was able to talk to me today, she states that she fell on the floor by accident, she could not get up.  She has not been eating for the last 2 days, but that she has access to water.  She was drinking significant amount of water at this time. Her hyponatremia is most likely due to excess  amount of water intake without salt.  She also is alcoholic, another consideration is beer potomania. My opinion is that hyponatremia appears to be acute.  I will obtain opinion from nephrology. Currently patient is on normal saline, sodium level is going up gradually.  Due to acute hyponatremia, we probably can let sodium go up fairly quickly. She will be also receiving 50 mEq of KCl infusion.  Additionally, I also added potassium in IV fluids. Monitor sodium and potassium level.  Alcohol liver disease with hepatic steatosis and ascites. Severe hyperbilirubinemia/jaundice. Elevated ammonia level. Lactic acidosis secondary to liver disease. Severe hypoalbuminemia secondary to liver cirrhosis. Liver ultrasound showed steatosis of the liver with small amount of ascites. Patient also has significant elevation of bilirubin level.  I will obtain a full liver function panel.  This most likely secondary to acute liver failure from alcohol drinking.  Ultrasound did not mention any bile duct dilatation.  I will obtain MRCP if patient has mainly direct bilirubin. Currently, patient has no evidence of hemolytic anemia.  She does not have anemia.  I will look at the level of indirect bilirubin.  [place] muscular dystrophy Fall at home in the setting of frequent falls Continue to follow.  Alcohol use disorder. Polysubstance abuse. Bipolar disorder. Continue CIWA protocol, continue current treatment.  Uncontrolled type 2 diabetes with hyperglycemia  Check A1c, continue sliding scale insulin for now.   DVT prophylaxis: Lovenox Code Status: full Family Communication:  Disposition Plan:    Status is: Inpatient  Remains inpatient appropriate because:Persistent severe electrolyte disturbances, IV treatments appropriate due to intensity  of illness or inability to take PO, and Inpatient level of care appropriate due to severity of illness  Dispo: The patient is from: Home              Anticipated d/c  is to: Home              Patient currently is not medically stable to d/c.   Difficult to place patient No        I/O last 3 completed shifts: In: 1687.9 [I.V.:487.9; IV Piggyback:1200] Out: 0  No intake/output data recorded.     Consultants:  Nephrology  Procedures: None  Antimicrobials: None  Subjective: Patient still drowsy, but no longer has any confusion.  She confirmed that she had a fall, was on the floor for 2 days.  She had access to water, but no access to food. She denies any short of breath. She has cough with small amount of mucus. No fever chills No dysuria hematuria.  Objective: Vitals:   12/18/20 0600 12/18/20 0730 12/18/20 0840 12/18/20 1031  BP: 133/65 133/65 135/71 130/71  Pulse: (!) 103 (!) 105 (!) 107 (!) 106  Resp: (!) $RemoveB'24  17 18  'ehHBeurM$ Temp:   98.1 F (36.7 C)   TempSrc:   Oral   SpO2: 94%  95% 94%  Weight:      Height:        Intake/Output Summary (Last 24 hours) at 12/18/2020 1046 Last data filed at 12/18/2020 0651 Gross per 24 hour  Intake 1687.86 ml  Output 0 ml  Net 1687.86 ml   Filed Weights   12/17/20 2255  Weight: 90.7 kg    Examination:  General exam: Appears calm and comfortable  Respiratory system: Clear to auscultation. Respiratory effort normal. Cardiovascular system: S1 & S2 heard, RRR. No JVD, murmurs, rubs, gallops or clicks. No pedal edema. Gastrointestinal system: Abdomen is nondistended, soft and nontender. No organomegaly or masses felt. Normal bowel sounds heard. Central nervous system: Drowsy and oriented x3. No focal neurological deficits. Extremities: Symmetric 5 x 5 power. Skin: No rashes, lesions or ulcers Psychiatry: Flat affect,    Data Reviewed: I have personally reviewed following labs and imaging studies  CBC: Recent Labs  Lab 12/17/20 2335  WBC 11.2*  HGB 13.4  HCT 39.6  MCV 93.8  PLT 161*   Basic Metabolic Panel: Recent Labs  Lab 12/17/20 2335 12/18/20 0621  NA 113* 118*  K 2.9* 2.5*   CL <65* <65*  CO2 33* 38*  GLUCOSE 286* 262*  BUN 6 5*  CREATININE <0.30* 0.37*  CALCIUM 7.6* 7.5*   GFR: Estimated Creatinine Clearance: 103.7 mL/min (A) (by C-G formula based on SCr of 0.37 mg/dL (L)). Liver Function Tests: Recent Labs  Lab 12/17/20 2335  AST 283*  ALT 39  ALKPHOS 495*  BILITOT 11.9*  PROT 6.7  ALBUMIN 1.8*   No results for input(s): LIPASE, AMYLASE in the last 168 hours. Recent Labs  Lab 12/17/20 2335  AMMONIA 52*   Coagulation Profile: No results for input(s): INR, PROTIME in the last 168 hours. Cardiac Enzymes: Recent Labs  Lab 12/17/20 2335  CKTOTAL 153   BNP (last 3 results) No results for input(s): PROBNP in the last 8760 hours. HbA1C: No results for input(s): HGBA1C in the last 72 hours. CBG: Recent Labs  Lab 12/18/20 0728  GLUCAP 276*   Lipid Profile: No results for input(s): CHOL, HDL, LDLCALC, TRIG, CHOLHDL, LDLDIRECT in the last 72 hours. Thyroid Function Tests: No results for input(s):  TSH, T4TOTAL, FREET4, T3FREE, THYROIDAB in the last 72 hours. Anemia Panel: No results for input(s): VITAMINB12, FOLATE, FERRITIN, TIBC, IRON, RETICCTPCT in the last 72 hours. Sepsis Labs: Recent Labs  Lab 12/18/20 0116 12/18/20 1791  LATICACIDVEN 2.5* 1.7    Recent Results (from the past 240 hour(s))  Resp Panel by RT-PCR (Flu A&B, Covid) Nasopharyngeal Swab     Status: None   Collection Time: 12/18/20  1:16 AM   Specimen: Nasopharyngeal Swab; Nasopharyngeal(NP) swabs in vial transport medium  Result Value Ref Range Status   SARS Coronavirus 2 by RT PCR NEGATIVE NEGATIVE Final    Comment: (NOTE) SARS-CoV-2 target nucleic acids are NOT DETECTED.  The SARS-CoV-2 RNA is generally detectable in upper respiratory specimens during the acute phase of infection. The lowest concentration of SARS-CoV-2 viral copies this assay can detect is 138 copies/mL. A negative result does not preclude SARS-Cov-2 infection and should not be used as the  sole basis for treatment or other patient management decisions. A negative result may occur with  improper specimen collection/handling, submission of specimen other than nasopharyngeal swab, presence of viral mutation(s) within the areas targeted by this assay, and inadequate number of viral copies(<138 copies/mL). A negative result must be combined with clinical observations, patient history, and epidemiological information. The expected result is Negative.  Fact Sheet for Patients:  EntrepreneurPulse.com.au  Fact Sheet for Healthcare Providers:  IncredibleEmployment.be  This test is no t yet approved or cleared by the [place] FDA and  has been authorized for detection and/or diagnosis of SARS-CoV-2 by FDA under an Emergency Use Authorization (EUA). This EUA will remain  in effect (meaning this test can be used) for the duration of the COVID-19 declaration under Section 564(b)(1) of the Act, 21 U.S.C.section 360bbb-3(b)(1), unless the authorization is terminated  or revoked sooner.       Influenza A by PCR NEGATIVE NEGATIVE Final   Influenza B by PCR NEGATIVE NEGATIVE Final    Comment: (NOTE) The Xpert Xpress SARS-CoV-2/FLU/RSV plus assay is intended as an aid in the diagnosis of influenza from Nasopharyngeal swab specimens and should not be used as a sole basis for treatment. Nasal washings and aspirates are unacceptable for Xpert Xpress SARS-CoV-2/FLU/RSV testing.  Fact Sheet for Patients: EntrepreneurPulse.com.au  Fact Sheet for Healthcare Providers: IncredibleEmployment.be  This test is not yet approved or cleared by the [place] FDA and has been authorized for detection and/or diagnosis of SARS-CoV-2 by FDA under an Emergency Use Authorization (EUA). This EUA will remain in effect (meaning this test can be used) for the duration of the COVID-19 declaration under Section 564(b)(1) of the  Act, 21 U.S.C. section 360bbb-3(b)(1), unless the authorization is terminated or revoked.  Performed at [facility], [address] [place], [place] [address]   Blood culture (routine x 2)     Status: None (Preliminary result)   Collection Time: 12/18/20  1:16 AM   Specimen: BLOOD  Result Value Ref Range Status   Specimen Description BLOOD RIGHT HAND  Final   Special Requests   Final    BOTTLES DRAWN AEROBIC AND ANAEROBIC Blood Culture results may not be optimal due to an inadequate volume of blood received in culture bottles   Culture   Final    NO GROWTH < 12 HOURS Performed at [facility], [address] [place], [place] [address]    Report Status PENDING  Incomplete  Blood culture (routine x 2)     Status: None (Preliminary result)  Collection Time: 12/18/20  1:16 AM   Specimen: BLOOD  Result Value Ref Range Status   Specimen Description BLOOD RIGHT ASSIST CONTROL  Final   Special Requests   Final    BOTTLES DRAWN AEROBIC AND ANAEROBIC Blood Culture adequate volume   Culture   Final    NO GROWTH < 12 HOURS Performed at [facility], [address], [place], [place] [address]    Report Status PENDING  Incomplete         Radiology Studies: CT HEAD WO CONTRAST (5MM)  Result Date: 12/17/2020 CLINICAL DATA:  Patient found down. EXAM: CT HEAD WITHOUT CONTRAST TECHNIQUE: Contiguous axial images were obtained from the base of the skull through the vertex without intravenous contrast. COMPARISON:  None. FINDINGS: Brain: No evidence of acute infarction, hemorrhage, hydrocephalus, extra-axial collection or mass lesion/mass effect. Vascular: No hyperdense vessel or unexpected calcification. Skull: Normal. Negative for fracture or focal lesion. Sinuses/Orbits: No acute finding. Other: None. IMPRESSION: No acute intracranial pathology. Electronically Signed   By: Virgina Norfolk M.D.   On: 12/17/2020 23:51   DG Chest Portable 1 View  Result Date:  12/17/2020 CLINICAL DATA:  Hypoxia EXAM: PORTABLE CHEST 1 VIEW COMPARISON:  11/16/2020 FINDINGS: Cardiac shadow is stable. Left lung remains clear. Elevation of the right hemidiaphragm is noted. Right basilar effusion and underlying atelectasis/infiltrate is present. IMPRESSION: New right basilar infiltrate and effusion. Electronically Signed   By: Inez Catalina M.D.   On: 12/17/2020 23:42   US ABDOMEN LIMITED RUQ (LIVER/GB)  Result Date: 12/18/2020 CLINICAL DATA:  Elevated liver function tests. EXAM: ULTRASOUND ABDOMEN LIMITED RIGHT UPPER QUADRANT COMPARISON:  None. FINDINGS: Gallbladder: No gallstones or wall thickening visualized (2.7 mm). No sonographic Murphy sign noted by sonographer. Common bile duct: Diameter: 2.3 mm Liver: No focal lesion identified. Diffusely increased echogenicity of the liver parenchyma is noted. Portal vein is patent on color Doppler imaging with normal direction of blood flow towards the liver. Other: A small amount of ascites is seen within the right upper quadrant. IMPRESSION: 1. Hepatic steatosis. 2. Small amount of right upper quadrant ascites. Electronically Signed   By: Virgina Norfolk M.D.   On: 12/18/2020 02:03        Scheduled Meds:  enoxaparin (LOVENOX) injection  0.5 mg/kg Subcutaneous Q24H   insulin aspart  0-20 Units Subcutaneous TID WC   insulin aspart  0-5 Units Subcutaneous QHS   LORazepam  0-4 mg Intravenous Q6H   Or   LORazepam  0-4 mg Oral Q6H   [START ON 12/20/2020] LORazepam  0-4 mg Intravenous Q12H   Or   [START ON 12/20/2020] LORazepam  0-4 mg Oral Q12H   thiamine  100 mg Oral Daily   Or   thiamine  100 mg Intravenous Daily   Continuous Infusions:  0.9 % NaCl with KCl 20 mEq / L     ampicillin-sulbactam (UNASYN) IV     potassium chloride       LOS: 0 days    Time spent: no charge    Sharen Hones, MD [facility]   To contact the attending provider between 7A-7P or the covering provider during after hours 7P-7A, please  log into the web site [email or web address] and access using universal [place] password for that web site. If you do not have the password, please call the hospital operator.  12/18/2020, 10:46 AM

## 2020-12-18 NOTE — ED Notes (Signed)
Called lab to have phleb draw labs

## 2020-12-18 NOTE — ED Notes (Signed)
Patient is resting comfortably. 

## 2020-12-18 NOTE — Consult Note (Signed)
[name] MRN: [id number] DOB/AGE: 05-28-1975 45 y.o. Primary Care Physician:O'Buch, Pauline Good Admit date: 12/17/2020 Chief Complaint:  Chief Complaint  Patient presents with   Delirium Tremens (DTS)   HPI: Patient is a 45 year old Caucasian female with a past medical history of bipolar disorder, diabetes mellitus, alcohol abuse, polysubstance abuse, muscular dystrophy who was brought to the ER with chief complaint of fall around 2 to 3 days back.  Patient did not give any history of loss of consciousness no history of seizures from the medical records either. Patient main complaint was that she has been drinking vodka and beer since she fell down.  Nephrology was consulted because of patient's low sodium Patient was seen in the ER Patient remains lethargic but arousable Patient offers no new specific physical complaints. No complaint of fever/cough or chills No history of seizures   Past Medical History:  Diagnosis Date   Alcohol abuse    Anxiety    Anxiety    Anxiety and depression    Asthma    Bipolar disorder ([name])    Borderline personality disorder ([name])    Depression    Diabetes ([name])    Type 2    Dyslipidemia    Esophageal varices ([name])    Foot drop    right   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[name])    GERD (gastroesophageal reflux disease)    Gout    History of drug abuse ([name])    Insomnia    Muscular dystrophies ([name])    Neuropathy    Pancreatitis    Recovering alcoholic ([name])         Family History  Problem Relation Age of Onset   Heart disease Mother    Heart attack Mother    Hyperlipidemia Maternal Grandmother    Diabetes Maternal Grandmother    Hypertension Maternal Grandmother    Hyperlipidemia Maternal Grandfather    Heart disease Maternal Grandfather    Heart attack Maternal Grandfather    Diabetes Maternal Grandfather    Lung cancer Maternal Grandfather    Hypertension Maternal Grandfather    Other Paternal Grandmother         Thyroid problems    Heart disease Paternal Grandfather    Stroke Paternal Grandfather    Hyperlipidemia Paternal Grandfather    Heart attack Paternal Grandfather    Hypertension Paternal Grandfather     Social History:  reports that she has been smoking. She has been smoking an average of .5 packs per day. She has never used smokeless tobacco. She reports current alcohol use. She reports current drug use. Drugs: IV and Cocaine.   Allergies:  Allergies  Allergen Reactions   Erythromycin Anaphylaxis   Sulfa Antibiotics Anaphylaxis   Tylenol [Acetaminophen] Other (See Comments)    Pancreatitis and liver dysfunction, not supposed to take med   Nsaids Other (See Comments)    Causes GI problems, very bad reaction-per patient.    Gabapentin     Mood changes     (Not in a hospital admission)      NWG:NFAOZ from the symptoms mentioned above,there are no other symptoms referable to all systems reviewed.   enoxaparin (LOVENOX) injection  0.5 mg/kg Subcutaneous Q24H   insulin aspart  0-20 Units Subcutaneous TID WC   insulin aspart  0-5 Units Subcutaneous QHS   LORazepam  0-4 mg Intravenous Q6H   Or   LORazepam  0-4 mg Oral Q6H   [START ON 12/20/2020] LORazepam  0-4 mg Intravenous Q12H  Or   [START ON 12/20/2020] LORazepam  0-4 mg Oral Q12H   thiamine  100 mg Oral Daily   Or   thiamine  100 mg Intravenous Daily        Physical Exam: Vital signs in last 24 hours: Temp:  [98.1 F (36.7 C)-99.3 F (37.4 C)] 98.1 F (36.7 C) (10/02 0840) Pulse Rate:  [89-139] 102 (10/02 1332) Resp:  [14-31] 15 (10/02 1332) BP: (111-135)/(60-85) 131/77 (10/02 1332) SpO2:  [88 %-99 %] 95 % (10/02 1332) Weight:  [90.7 kg] 90.7 kg (10/01 2255) Weight change:     Intake/Output from previous day: 10/01 0701 - 10/02 0700 In: 1687.9 [I.V.:487.9; IV Piggyback:1200] Out: 0  Total I/O In: 500 [I.V.:500] Out: -    Physical Exam: General- pt is lethargic but arousable Resp- No  acute REsp distress, CTA B/L NO Rhonchi CVS- S1S2 regular ij rate and rhythm GIT- BS+, soft, NT, ND EXT- NO LE Edema, no cyanosis CNS- CN 2-12 grossly intact. Moving all 4 extremities Psych- normal mo0d and affect    Lab Results: CBC Recent Labs    12/17/20 2335  WBC 11.2*  HGB 13.4  HCT 39.6  PLT 132*    BMET Recent Labs    12/18/20 0621 12/18/20 1205  NA 118* 118*  K 2.5* 2.4*  CL <65* 66*  CO2 38* 38*  GLUCOSE 262* 193*  BUN 5* <5*  CREATININE 0.37* 0.31*  CALCIUM 7.5* 7.0*    MICRO Recent Results (from the past 240 hour(s))  Resp Panel by RT-PCR (Flu A&B, Covid) Nasopharyngeal Swab     Status: None   Collection Time: 12/18/20  1:16 AM   Specimen: Nasopharyngeal Swab; Nasopharyngeal(NP) swabs in vial transport medium  Result Value Ref Range Status   SARS Coronavirus 2 by RT PCR NEGATIVE NEGATIVE Final    Comment: (NOTE) SARS-CoV-2 target nucleic acids are NOT DETECTED.  The SARS-CoV-2 RNA is generally detectable in upper respiratory specimens during the acute phase of infection. The lowest concentration of SARS-CoV-2 viral copies this assay can detect is 138 copies/mL. A negative result does not preclude SARS-Cov-2 infection and should not be used as the sole basis for treatment or other patient management decisions. A negative result may occur with  improper specimen collection/handling, submission of specimen other than nasopharyngeal swab, presence of viral mutation(s) within the areas targeted by this assay, and inadequate number of viral copies(<138 copies/mL). A negative result must be combined with clinical observations, patient history, and epidemiological information. The expected result is Negative.  Fact Sheet for Patients:  BloggerCourse.com  Fact Sheet for Healthcare Providers:  SeriousBroker.it  This test is no t yet approved or cleared by the [place] FDA and  has been authorized  for detection and/or diagnosis of SARS-CoV-2 by FDA under an Emergency Use Authorization (EUA). This EUA will remain  in effect (meaning this test can be used) for the duration of the COVID-19 declaration under Section 564(b)(1) of the Act, 21 U.S.C.section 360bbb-3(b)(1), unless the authorization is terminated  or revoked sooner.       Influenza A by PCR NEGATIVE NEGATIVE Final   Influenza B by PCR NEGATIVE NEGATIVE Final    Comment: (NOTE) The Xpert Xpress SARS-CoV-2/FLU/RSV plus assay is intended as an aid in the diagnosis of influenza from Nasopharyngeal swab specimens and should not be used as a sole basis for treatment. Nasal washings and aspirates are unacceptable for Xpert Xpress SARS-CoV-2/FLU/RSV testing.  Fact Sheet for Patients: BloggerCourse.com  Fact Sheet for  Healthcare Providers: SeriousBroker.it  This test is not yet approved or cleared by the [place] and has been authorized for detection and/or diagnosis of SARS-CoV-2 by FDA under an Emergency Use Authorization (EUA). This EUA will remain in effect (meaning this test can be used) for the duration of the COVID-19 declaration under Section 564(b)(1) of the Act, 21 U.S.C. section 360bbb-3(b)(1), unless the authorization is terminated or revoked.  Performed at [facility], [address] [place], [place] [address]   Blood culture (routine x 2)     Status: None (Preliminary result)   Collection Time: 12/18/20  1:16 AM   Specimen: BLOOD  Result Value Ref Range Status   Specimen Description BLOOD RIGHT HAND  Final   Special Requests   Final    BOTTLES DRAWN AEROBIC AND ANAEROBIC Blood Culture results may not be optimal due to an inadequate volume of blood received in culture bottles   Culture   Final    NO GROWTH < 12 HOURS Performed at [facility], [address], [place], [place] [address]    Report Status PENDING  Incomplete   Blood culture (routine x 2)     Status: None (Preliminary result)   Collection Time: 12/18/20  1:16 AM   Specimen: BLOOD  Result Value Ref Range Status   Specimen Description BLOOD RIGHT ASSIST CONTROL  Final   Special Requests   Final    BOTTLES DRAWN AEROBIC AND ANAEROBIC Blood Culture adequate volume   Culture   Final    NO GROWTH < 12 HOURS Performed at [facility], [address], [place], [place] [address]    Report Status PENDING  Incomplete      Lab Results  Component Value Date   CALCIUM 7.0 (L) 12/18/2020   CAION 1.01 (L) 05/07/2009   PHOS 3.9 10/01/2012    Chest x-ray done on December 17, 2020 FINDINGS: Cardiac shadow is stable. Left lung remains clear. Elevation of the right hemidiaphragm is noted. Right basilar effusion and underlying atelectasis/infiltrate is present.   IMPRESSION: New right basilar infiltrate and effusion.   Impression:   1) hyponatremia Patient has hypovolemic hyponatremia Data in favor of hypovolemia as -Patient has no edema -Patient is hypotensive -Patient gave a history of being on the floor for past 2 days and not eating  -There is a component of low osmolar intake Data in favor of low osmolar intake is -Patient gave a history of having had decreased p.o. intake -Patient BUN is less than 5   There could be some contribution from SSRIs as patient was on fluoxetine as an outpatient    Patient sodium was 113 at the time of presentation has now improved 118. Will hold off on giving 3% saline as patient showed is improving -We will hold off on giving tolvaptan as patient has liver failure-patient bilirubin is at 11.7   2) sepsis Patient most likely has aspiration pneumonia Patient chest x-ray did show right lower lobe infiltrates Patient is currently on antibiotics Hospitalist team is following   3) hypokalemia Secondary to poor p.o. intake Being replete  4) alcoholic liver disease Patient has elevated  total bilirubin Patient has elevated ammonia level Patient has severe hypoalbuminemia most likely secondary to poor p.o. intake and if liver issues Primary team is following   5) alcohol abuse Patient is currently on CIWA protocol Hospitalist team is following      Plan:   We will ask for urine osmolality, urine sodium and  creatinine We will hold off on starting 3% saline or tolvaptan Will follow sodium every 6h  Aim is to correct sodium by around 8 meq in 24 hours.    Bode Pieper s Wolfgang Phoenix 12/18/2020, 2:39 PM

## 2020-12-19 DIAGNOSIS — K7010 Alcoholic hepatitis without ascites: Secondary | ICD-10-CM | POA: Diagnosis not present

## 2020-12-19 DIAGNOSIS — R7989 Other specified abnormal findings of blood chemistry: Secondary | ICD-10-CM | POA: Diagnosis not present

## 2020-12-19 DIAGNOSIS — A419 Sepsis, unspecified organism: Secondary | ICD-10-CM | POA: Diagnosis present

## 2020-12-19 DIAGNOSIS — E871 Hypo-osmolality and hyponatremia: Secondary | ICD-10-CM | POA: Diagnosis not present

## 2020-12-19 DIAGNOSIS — K709 Alcoholic liver disease, unspecified: Secondary | ICD-10-CM | POA: Diagnosis not present

## 2020-12-19 DIAGNOSIS — F1020 Alcohol dependence, uncomplicated: Secondary | ICD-10-CM | POA: Diagnosis not present

## 2020-12-19 LAB — URINALYSIS, COMPLETE (UACMP) WITH MICROSCOPIC
Bacteria, UA: NONE SEEN
Glucose, UA: NEGATIVE mg/dL
Ketones, ur: NEGATIVE mg/dL
Leukocytes,Ua: NEGATIVE
Nitrite: NEGATIVE
Protein, ur: NEGATIVE mg/dL
Specific Gravity, Urine: 1.008 (ref 1.005–1.030)
pH: 7 (ref 5.0–8.0)

## 2020-12-19 LAB — BASIC METABOLIC PANEL
Anion gap: 11 (ref 5–15)
Anion gap: 10 (ref 5–15)
Anion gap: 9 (ref 5–15)
Anion gap: 9 (ref 5–15)
BUN: <5 mg/dL — ABNORMAL LOW (ref 6–20)
BUN: <5 mg/dL — ABNORMAL LOW (ref 6–20)
BUN: <5 mg/dL — ABNORMAL LOW (ref 6–20)
BUN: <5 mg/dL — ABNORMAL LOW (ref 6–20)
CO2: 38 mmol/L — ABNORMAL HIGH (ref 22–32)
CO2: 37 mmol/L — ABNORMAL HIGH (ref 22–32)
CO2: 40 mmol/L — ABNORMAL HIGH (ref 22–32)
CO2: 39 mmol/L — ABNORMAL HIGH (ref 22–32)
Calcium: 6.9 mg/dL — ABNORMAL LOW (ref 8.9–10.3)
Calcium: 6.9 mg/dL — ABNORMAL LOW (ref 8.9–10.3)
Calcium: 7 mg/dL — ABNORMAL LOW (ref 8.9–10.3)
Calcium: 7.1 mg/dL — ABNORMAL LOW (ref 8.9–10.3)
Chloride: 73 mmol/L — ABNORMAL LOW (ref 98–111)
Chloride: 76 mmol/L — ABNORMAL LOW (ref 98–111)
Chloride: 77 mmol/L — ABNORMAL LOW (ref 98–111)
Chloride: 81 mmol/L — ABNORMAL LOW (ref 98–111)
Creatinine, Ser: <0.3 mg/dL — ABNORMAL LOW (ref 0.44–1.00)
Creatinine, Ser: <0.3 mg/dL — ABNORMAL LOW (ref 0.44–1.00)
Creatinine, Ser: <0.3 mg/dL — ABNORMAL LOW (ref 0.44–1.00)
Creatinine, Ser: <0.3 mg/dL — ABNORMAL LOW (ref 0.44–1.00)
Glucose, Bld: 181 mg/dL — ABNORMAL HIGH (ref 70–99)
Glucose, Bld: 170 mg/dL — ABNORMAL HIGH (ref 70–99)
Glucose, Bld: 155 mg/dL — ABNORMAL HIGH (ref 70–99)
Glucose, Bld: 176 mg/dL — ABNORMAL HIGH (ref 70–99)
Potassium: 2.7 mmol/L — CL (ref 3.5–5.1)
Potassium: 2.6 mmol/L — CL (ref 3.5–5.1)
Potassium: 2.8 mmol/L — ABNORMAL LOW (ref 3.5–5.1)
Potassium: 3.1 mmol/L — ABNORMAL LOW (ref 3.5–5.1)
Sodium: 122 mmol/L — ABNORMAL LOW (ref 135–145)
Sodium: 123 mmol/L — ABNORMAL LOW (ref 135–145)
Sodium: 126 mmol/L — ABNORMAL LOW (ref 135–145)
Sodium: 129 mmol/L — ABNORMAL LOW (ref 135–145)

## 2020-12-19 LAB — CBC WITH DIFFERENTIAL/PLATELET
Abs Immature Granulocytes: 0.17 10*3/uL — ABNORMAL HIGH (ref 0.00–0.07)
Basophils Absolute: 0.1 10*3/uL (ref 0.0–0.1)
Basophils Relative: 0 %
Eosinophils Absolute: 0.1 10*3/uL (ref 0.0–0.5)
Eosinophils Relative: 1 %
HCT: 26.7 % — ABNORMAL LOW (ref 36.0–46.0)
Hemoglobin: 9 g/dL — ABNORMAL LOW (ref 12.0–15.0)
Immature Granulocytes: 1 %
Lymphocytes Relative: 11 %
Lymphs Abs: 1.9 10*3/uL (ref 0.7–4.0)
MCH: 33.6 pg (ref 26.0–34.0)
MCHC: 33.7 g/dL (ref 30.0–36.0)
MCV: 99.6 fL (ref 80.0–100.0)
Monocytes Absolute: 0.7 10*3/uL (ref 0.1–1.0)
Monocytes Relative: 4 %
Neutro Abs: 14 10*3/uL — ABNORMAL HIGH (ref 1.7–7.7)
Neutrophils Relative %: 83 %
Platelets: 123 10*3/uL — ABNORMAL LOW (ref 150–400)
RBC: 2.68 MIL/uL — ABNORMAL LOW (ref 3.87–5.11)
RDW: 20.2 % — ABNORMAL HIGH (ref 11.5–15.5)
WBC: 16.9 10*3/uL — ABNORMAL HIGH (ref 4.0–10.5)
nRBC: 0.5 % — ABNORMAL HIGH (ref 0.0–0.2)

## 2020-12-19 LAB — SODIUM: Sodium: 126 mmol/L — ABNORMAL LOW (ref 135–145)

## 2020-12-19 LAB — HEPATIC FUNCTION PANEL
ALT: 31 U/L (ref 0–44)
AST: 209 U/L — ABNORMAL HIGH (ref 15–41)
Albumin: <1.5 g/dL — ABNORMAL LOW (ref 3.5–5.0)
Alkaline Phosphatase: 338 U/L — ABNORMAL HIGH (ref 38–126)
Bilirubin, Direct: 7.8 mg/dL — ABNORMAL HIGH (ref 0.0–0.2)
Indirect Bilirubin: 5.1 mg/dL — ABNORMAL HIGH (ref 0.3–0.9)
Total Bilirubin: 12.9 mg/dL — ABNORMAL HIGH (ref 0.3–1.2)
Total Protein: 5.9 g/dL — ABNORMAL LOW (ref 6.5–8.1)

## 2020-12-19 LAB — TSH: TSH: 4.881 u[IU]/mL — ABNORMAL HIGH (ref 0.350–4.500)

## 2020-12-19 LAB — CK: Total CK: 56 U/L (ref 38–234)

## 2020-12-19 LAB — GLUCOSE, CAPILLARY
Glucose-Capillary: 206 mg/dL — ABNORMAL HIGH (ref 70–99)
Glucose-Capillary: 190 mg/dL — ABNORMAL HIGH (ref 70–99)

## 2020-12-19 LAB — PROCALCITONIN: Procalcitonin: 1.11 ng/mL

## 2020-12-19 LAB — PROTIME-INR
INR: 1.2 (ref 0.8–1.2)
Prothrombin Time: 15.6 seconds — ABNORMAL HIGH (ref 11.4–15.2)

## 2020-12-19 LAB — URINE DRUG SCREEN, QUALITATIVE (ARMC ONLY)
Amphetamines, Ur Screen: NOT DETECTED
Barbiturates, Ur Screen: NOT DETECTED
Benzodiazepine, Ur Scrn: POSITIVE — AB
Cannabinoid 50 Ng, Ur ~~LOC~~: NOT DETECTED
Cocaine Metabolite,Ur ~~LOC~~: POSITIVE — AB
MDMA (Ecstasy)Ur Screen: NOT DETECTED
Methadone Scn, Ur: NOT DETECTED
Opiate, Ur Screen: NOT DETECTED
Phencyclidine (PCP) Ur S: NOT DETECTED
Tricyclic, Ur Screen: NOT DETECTED

## 2020-12-19 LAB — CREATININE, URINE, RANDOM: Creatinine, Urine: 40 mg/dL

## 2020-12-19 LAB — HEMOGLOBIN A1C
Hgb A1c MFr Bld: 11.1 % — ABNORMAL HIGH (ref 4.8–5.6)
Mean Plasma Glucose: 272 mg/dL

## 2020-12-19 LAB — PHOSPHORUS
Phosphorus: 1 mg/dL — CL (ref 2.5–4.6)
Phosphorus: 1.2 mg/dL — ABNORMAL LOW (ref 2.5–4.6)

## 2020-12-19 LAB — MAGNESIUM
Magnesium: 1.6 mg/dL — ABNORMAL LOW (ref 1.7–2.4)
Magnesium: 2 mg/dL (ref 1.7–2.4)

## 2020-12-19 LAB — AMMONIA: Ammonia: 81 umol/L — ABNORMAL HIGH (ref 9–35)

## 2020-12-19 LAB — MRSA NEXT GEN BY PCR, NASAL: MRSA by PCR Next Gen: DETECTED — AB

## 2020-12-19 LAB — LIPASE, BLOOD: Lipase: 29 U/L (ref 11–51)

## 2020-12-19 LAB — CORTISOL-AM, BLOOD: Cortisol - AM: 16.3 ug/dL (ref 6.7–22.6)

## 2020-12-19 LAB — OSMOLALITY, URINE: Osmolality, Ur: 154 mOsm/kg — ABNORMAL LOW (ref 300–900)

## 2020-12-19 LAB — SODIUM, URINE, RANDOM: Sodium, Ur: <10 mmol/L

## 2020-12-19 MED ORDER — POTASSIUM CHLORIDE IN NACL 40-0.9 MEQ/L-% IV SOLN
INTRAVENOUS | Status: DC
  Filled 2020-12-19 (×3): qty 1000

## 2020-12-19 MED ORDER — ALBUTEROL SULFATE (2.5 MG/3ML) 0.083% IN NEBU
2.5000 mg | INHALATION_SOLUTION | Freq: Four times a day (QID) | RESPIRATORY_TRACT | Status: DC | PRN
  Administered 2020-12-19 – 2021-01-07 (×3): 2.5 mg via RESPIRATORY_TRACT
  Filled 2020-12-19 (×3): qty 3

## 2020-12-19 MED ORDER — PANTOPRAZOLE SODIUM 40 MG IV SOLR
40.0000 mg | Freq: Two times a day (BID) | INTRAVENOUS | Status: DC
  Administered 2020-12-19 – 2021-01-01 (×27): 40 mg via INTRAVENOUS
  Filled 2020-12-19 (×27): qty 40

## 2020-12-19 MED ORDER — POTASSIUM PHOSPHATES 15 MMOLE/5ML IV SOLN
30.0000 mmol | Freq: Once | INTRAVENOUS | Status: AC
  Administered 2020-12-19: 30 mmol via INTRAVENOUS
  Filled 2020-12-19: qty 10

## 2020-12-19 MED ORDER — METHOCARBAMOL 500 MG PO TABS
500.0000 mg | ORAL_TABLET | Freq: Two times a day (BID) | ORAL | Status: DC | PRN
  Administered 2020-12-20 – 2021-01-26 (×34): 500 mg via ORAL
  Filled 2020-12-19 (×36): qty 1

## 2020-12-19 MED ORDER — FOLIC ACID 1 MG PO TABS
1.0000 mg | ORAL_TABLET | Freq: Every day | ORAL | Status: DC
  Administered 2020-12-19 – 2021-01-26 (×39): 1 mg via ORAL
  Filled 2020-12-19 (×39): qty 1

## 2020-12-19 MED ORDER — THIAMINE HCL 100 MG PO TABS: 500.0000 mg | ORAL_TABLET | Freq: Every day | ORAL | Status: DC

## 2020-12-19 MED ORDER — LACTULOSE 10 GM/15ML PO SOLN
20.0000 g | Freq: Every day | ORAL | Status: DC | PRN
  Administered 2020-12-19: 20 g via ORAL
  Filled 2020-12-19: qty 30

## 2020-12-19 MED ORDER — THIAMINE HCL 100 MG PO TABS: 100.0000 mg | ORAL_TABLET | Freq: Every day | ORAL | Status: DC

## 2020-12-19 MED ORDER — THIAMINE HCL 100 MG/ML IJ SOLN
500.0000 mg | Freq: Three times a day (TID) | INTRAVENOUS | Status: AC
  Administered 2020-12-19 – 2020-12-22 (×9): 500 mg via INTRAVENOUS
  Filled 2020-12-19 (×9): qty 5

## 2020-12-19 MED ORDER — THIAMINE HCL 100 MG PO TABS
100.0000 mg | ORAL_TABLET | Freq: Every day | ORAL | Status: DC
  Administered 2020-12-23 – 2021-01-26 (×35): 100 mg via ORAL
  Filled 2020-12-19 (×35): qty 1

## 2020-12-19 MED ORDER — MAGNESIUM SULFATE 2 GM/50ML IV SOLN
2.0000 g | Freq: Once | INTRAVENOUS | Status: AC
  Administered 2020-12-19: 2 g via INTRAVENOUS
  Filled 2020-12-19: qty 50

## 2020-12-19 MED ORDER — RIFAXIMIN 550 MG PO TABS
550.0000 mg | ORAL_TABLET | Freq: Two times a day (BID) | ORAL | Status: DC
  Administered 2020-12-19 – 2021-01-07 (×38): 550 mg via ORAL
  Filled 2020-12-19 (×40): qty 1

## 2020-12-19 MED ORDER — ADULT MULTIVITAMIN W/MINERALS CH
1.0000 | ORAL_TABLET | Freq: Every day | ORAL | Status: DC
  Administered 2020-12-19 – 2021-01-26 (×38): 1 via ORAL
  Filled 2020-12-19 (×38): qty 1

## 2020-12-19 NOTE — Assessment & Plan Note (Signed)
UDS + for cocaine and benzo

## 2020-12-19 NOTE — Progress Notes (Signed)
[place] [facility]te Monitoring [facility] Replacement   Recent Labs: Potassium (mmol/L)  Date Value  12/19/2020 2.8 (L)   Magnesium (mg/dL)  Date Value  12/19/2020 2.0   Calcium (mg/dL)  Date Value  12/19/2020 7.0 (L)   Albumin (g/dL)  Date Value  12/19/2020 <1.5 (L)   Phosphorus (mg/dL)  Date Value  12/19/2020 1.2 (L)   Sodium (mmol/L)  Date Value  12/19/2020 126 (L)  12/19/2020 126 (L)     Assessment: 46yo F with PMH Bipolar disorder, diabetes, alcohol use disorder and polysubstance abuse, FSH muscular dystrophy, with history of falls who was admitted to the hospital after she fell at home 2 days prior and was unable to get up. Pt was noted to be jaundiced on admin with AST 283, alk phos 495, total bilirubin 11.9, and ammonia 52.   Current electrolyte abnormalities likely due to refeeding syndrome/poor nutritional status given alcohol abuse  Pt currently has NS w/ 60mq Kcl running at 1037mhr    Goal of Therapy:  Electrolytes WNL  Plan:  10/3 @ 1547:  K 2.8  Mag 2.0  Phos 1.2  Scr <0.30  Na 126 -Will continue NS w/ 4075mL Kcl _0 /hr -Will give K phos 43m26mIV x1 again -Will continue to monitor electrolytes and replace as indicated   MerrNoralee SpacearmD Clinical Pharmacist 12/19/2020 6:02 PM

## 2020-12-19 NOTE — Assessment & Plan Note (Signed)
Falls at home - PT recommends SNF

## 2020-12-19 NOTE — Progress Notes (Signed)
[place] [facility] Electrolyte Monitoring [facility] Replacement   Recent Labs: Potassium (mmol/L)  Date Value  12/19/2020 2.6 (LL)   Magnesium (mg/dL)  Date Value  12/19/2020 1.6 (L)   Calcium (mg/dL)  Date Value  12/19/2020 6.9 (L)   Albumin (g/dL)  Date Value  12/19/2020 <1.5 (L)   Phosphorus (mg/dL)  Date Value  12/19/2020 1.0 (LL)   Sodium (mmol/L)  Date Value  12/19/2020 123 (L)     Assessment: 45yo F with PMH Bipolar disorder, diabetes, alcohol use disorder and polysubstance abuse, FSH muscular dystrophy, with history of falls who was admitted to the hospital after she fell at home 2 days prior and was unable to get up. Pt was noted to be jaundiced on admin with AST 283, alk phos 495, total bilirubin 11.9, and ammonia 52.   Current electrolyte abnormalities likely due to refeeding syndrome/poor nutritional status given alcohol abuse  Pt currently has NS w/ 21mq Kcl running at 1025mhr  Na 122 K 2.7 Phos 1.0 Mg 1.6  Goal of Therapy:  Electrolytes WNL  Plan:  -Will continue NS w/ 2067mL Kcl _0 /hr -Will give K phos 29m93mIV x1 -Will continue to monitor electrolytes and replace as indicated   BranNarda RutherfordarmD Clinical Pharmacist 12/19/2020 2:09 PM

## 2020-12-19 NOTE — Evaluation (Signed)
Occupational Therapy Evaluation Patient Details Name: [name] MRN: [id number] DOB: June 06, 1975 Today's Date: 12/19/2020   History of Present Illness 45 y.o. female with medical history significant for Bipolar disorder, diabetes, alcohol use disorder and polysubstance abuse, FSH muscular dystrophy, with history of falls, last seen by neurology in January 2020, who was brought in by EMS after she fell at home 2 days prior and was unable to get up.   Clinical Impression   Patient presenting with decreased Ind in self care, balance, functional mobility/transfers, endurance, and safety awareness. Patient reports living in second floor apartment with 2 roommates. Pt with B foot drop and reports she does not wear AFO's and has been using RW the last two weeks for ambulation. She reports being Ind in self care tasks and sharing IADL tasks with roommates. Pt appears oriented by answers questions with slurred speech and needing increased time to process. Phone ringing in room and OT assisted pt with obtaining for her to order dinner. Pt falling asleep throughout session. Total A for bed mobility and pt declined OOB. Short term rehab recommended based on pt performance this session but hope that pt will improve and be able to return home at discharge. Patient will benefit from acute OT to increase overall independence in the areas of ADLs, functional mobility, safety awareness in order to safely discharge to next venue of care.      Recommendations for follow up therapy are one component of a multi-disciplinary discharge planning process, led by the attending physician.  Recommendations may be updated based on patient status, additional functional criteria and insurance authorization.   Follow Up Recommendations  SNF    Equipment Recommendations  3 in 1 bedside commode       Precautions / Restrictions Precautions Precautions: Fall      Mobility Bed Mobility Overal bed mobility: Needs  Assistance             General bed mobility comments: total A to move LEs in bed but declines OOB and EOB once moving    Transfers                 General transfer comment: Pt refusal        ADL either performed or assessed with clinical judgement   ADL Overall ADL's : Needs assistance/impaired     Grooming: Wash/dry hands;Wash/dry face;Minimal assistance;Bed level                                 General ADL Comments: Pt is very lethargic and having difficulty performing tasks this session.     Vision Patient Visual Report: No change from baseline              Pertinent Vitals/Pain Pain Assessment: 0-10 Pain Score: 8  Pain Location: "entire right side" Pain Descriptors / Indicators: Discomfort Pain Intervention(s): Limited activity within patient's tolerance;Monitored during session;Repositioned     Hand Dominance Right   Extremity/Trunk Assessment Upper Extremity Assessment Upper Extremity Assessment: RUE deficits/detail;LUE deficits/detail RUE Deficits / Details: decreased gross strength with edema noted. RUE: Unable to fully assess due to pain LUE Deficits / Details: decreased gross strength with edema noted LUE: Unable to fully assess due to pain   Lower Extremity Assessment Lower Extremity Assessment:  (B foot drop at baseline)       Communication Communication Communication: Expressive difficulties;Other (comment) (slurring of words)   Cognition Arousal/Alertness: Lethargic Behavior During  Therapy: Flat affect Overall Cognitive Status: Within Functional Limits for tasks assessed                                 General Comments: Pt answers orientation questions correctly with increased time and often falling asleep requiring redirection to attend to tasks.              Home Living Family/patient expects to be discharged to:: Private residence Living Arrangements: Non-relatives/Friends;Other  relatives Available Help at Discharge: Other (Comment) (pt reports living with 2 roommates) Type of Home: Apartment Home Access: Stairs to enter [employer] of [employer]: lives on second floor apartment. No elevator.   Home Layout: One level               Home Equipment: Walker - 2 wheels;Other (comment)   Additional Comments: B AFOs      Prior Functioning/Environment Level of Independence: Needs assistance  Gait / Transfers Assistance Needed: Pt reports 2 weeks ago she started using RW. She has B AFO's because of foot drop but reports she doesn't wear them because they are uncomfortable. ADL's / Homemaking Assistance Needed: Pt reports she still drives, cousin brings groceries, and her and roommates share IADLs            OT Problem List: Decreased strength;Decreased activity tolerance;Impaired balance (sitting and/or standing);Decreased safety awareness;Decreased knowledge of use of DME or AE;Pain      OT Treatment/Interventions: Self-care/ADL training;Therapeutic exercise;Therapeutic activities;Energy conservation;DME and/or AE instruction;Patient/family education;Balance training;Manual therapy    OT Goals(Current goals can be found in the care plan section) Acute Rehab OT Goals Patient Stated Goal: to go home OT Goal Formulation: With patient Time For Goal Achievement: 01/02/21 Potential to Achieve Goals: Fair ADL Goals Pt Will Perform Grooming: standing;with min assist Pt Will Perform Lower Body Dressing: with min assist;sit to/from stand Pt Will Transfer to Toilet: with min assist;ambulating Pt Will Perform Toileting - Clothing Manipulation and hygiene: with min assist;sit to/from stand  OT Frequency: Min 2X/week   Barriers to D/C: Decreased caregiver support;Inaccessible home environment  second floor apartment          AM-PAC OT "6 Clicks" Daily Activity     Outcome Measure Help from another person eating meals?: A Little Help from another person  taking care of personal grooming?: A Little Help from another person toileting, which includes using toliet, bedpan, or urinal?: Total Help from another person bathing (including washing, rinsing, drying)?: Total Help from another person to put on and taking off regular upper body clothing?: A Lot Help from another person to put on and taking off regular lower body clothing?: Total 6 Click Score: 11   End of Session Equipment Utilized During Treatment: Oxygen Nurse Communication: Mobility status  Activity Tolerance: Patient limited by fatigue;Patient limited by lethargy Patient left: in bed;with call bell/phone within reach;with bed alarm set  OT Visit Diagnosis: Unsteadiness on feet (R26.81);Muscle weakness (generalized) (M62.81);History of falling (Z91.81)                Time: 2505-3976 OT Time Calculation (min): 19 min Charges:  OT General Charges $OT Visit: 1 Visit OT Evaluation $OT Eval Moderate Complexity: 1 Mod OT Treatments $Therapeutic Activity: 8-22 mins  Jackquline Denmark, MS, OTR/L , CBIS ascom [phone]  12/19/20, 4:00 PM

## 2020-12-19 NOTE — Assessment & Plan Note (Addendum)
Due to aspiration pneumonia, continue IV unasyn.

## 2020-12-19 NOTE — Assessment & Plan Note (Signed)
Counseled. This must stop

## 2020-12-19 NOTE — Assessment & Plan Note (Signed)
Pharmacy managing electrolytes aggressively.

## 2020-12-19 NOTE — TOC Initial Note (Signed)
Transition of Care [facility]) - Initial/Assessment Note    Patient Details  Name: [name] MRN: [id number] Date of Birth: [birth year]  Transition [facility]) CM/SW Contact:    Hetty Ely, RN Phone Number: 12/19/2020, 2:25 PM  Clinical Narrative: Patient drousy, oriented to name and place, says she lives with cousin and roommate did not know the name of pharmacy, later say [facility] provides transportation if needed. No assisted devices. TOC to continue to track for discharge needs.                        Patient Goals and CMS Choice        Expected Discharge Plan and Services                                                Prior Living Arrangements/Services                       Activities of Daily Living Home Assistive Devices/Equipment: None ADL Screening (condition at time of admission) Patient's cognitive ability adequate to safely complete daily activities?: No Is the patient deaf or have difficulty hearing?: No Does the patient have difficulty seeing, even when wearing glasses/contacts?: No Does the patient have difficulty concentrating, remembering, or making decisions?: No Patient able to express need for assistance with ADLs?: Yes Does the patient have difficulty dressing or bathing?: Yes Independently performs ADLs?: No Communication: Independent Dressing (OT): Needs assistance Is this a change from baseline?: Change from baseline, expected to last <3days Grooming: Needs assistance Is this a change from baseline?: Change from baseline, expected to last <3 days Feeding: Independent Bathing: Needs assistance Is this a change from baseline?: Change from baseline, expected to last <3 days Toileting: Needs assistance Is this a change from baseline?: Change from baseline, expected to last <3 days In/Out Bed: Needs assistance Is this a change from baseline?: Change from baseline, expected to last <3 days Walks in Home:  Needs assistance Is this a change from baseline?: Change from baseline, expected to last <3 days Does the patient have difficulty walking or climbing stairs?: Yes Weakness of Legs: Both Weakness of Arms/Hands: None  Permission Sought/Granted                  Emotional Assessment              Admission diagnosis:  Acute hyponatremia [E87.1] Hypokalemia [E87.6] Hyponatremia [E87.1] Weakness [R53.1] Aspiration pneumonia of right lower lobe, unspecified aspiration pneumonia type ([name]) [J69.0] Patient Active Problem List   Diagnosis Date Noted   Acute hyponatremia 12/18/2020   Alcohol use disorder, moderate, dependence ([name]) 12/18/2020   Hepatic steatosis 12/18/2020   Polysubstance abuse ([name]) 12/18/2020   Hyperglycemia due to type 2 diabetes mellitus ([name]) 12/18/2020   Hyperbilirubinemia 12/18/2020   Lactic acidosis 12/18/2020   Alcoholic liver disease ([name]) 12/18/2020   Bipolar 1 disorder, manic, moderate ([name]) 09/10/2018   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   Diabetes ([name]) 09/10/2018   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   Anxiety and depression 10/01/2012   Protein-calorie malnutrition, severe ([name]) 09/30/2012   Alcohol abuse 09/26/2012   Trichomonas infection 09/26/2012   Dehydration 09/26/2012   Hypokalemia 11/14/2011   Thrombocytopenia ([name]) 11/14/2011   Alcohol withdrawal ([name]) 11/13/2011   Bipolar 2 disorder (  [name]) 11/13/2011   Tobacco abuse 11/13/2011   Cocaine abuse ([name]) 11/13/2011   Gout 11/13/2011   PCP:  Eunice Blase, PA-C Pharmacy:   [facility] [phone] - [place], [place] - [address] AT [facility] OF [address] [place] [address] [place] [address] Phone: [phone] Fax: [phone]     Social Determinants of Health (SDOH) Interventions    Readmission Risk Interventions No flowsheet data found.

## 2020-12-19 NOTE — Assessment & Plan Note (Signed)
GI c/s - getting hepatitis panel, likely due to ischemic hepatitis. Getting doppler liver per GI, if neg and if bilirubin continues to trend up - will need MRCP.

## 2020-12-19 NOTE — Assessment & Plan Note (Signed)
Slowly improving with hydration. nephro on board. 113->122. Encouraged PO intake

## 2020-12-19 NOTE — Progress Notes (Signed)
PT Cancellation Note  Patient Details Name: [name] MRN: [id number] DOB: Aug 18, 1975   Cancelled Treatment:    Reason Eval/Treat Not Completed: Other (comment).  PT consult received.  Chart reviewed.  Pt sleeping in bed upon PT arrival; pt opens eyes with vc's; pt unable to stay awake (pt repetitively falling back asleep) and when pt was awake, pt declining physical therapy d/t not feeling up to it.  Will re-attempt PT evaluation tomorrow.  Hendricks Limes, PT 12/19/20, 5:11 PM

## 2020-12-19 NOTE — Assessment & Plan Note (Signed)
a1c 11.1 - continue SSI and add semglee 8 units for better sugar control

## 2020-12-19 NOTE — Consult Note (Signed)
[name] , MD [address], [place], [place], [address] [address], [place], [place], [address] Phone: [phone]  Fax: [phone]  Consultation  Referring Provider:     Dr. Sherryll Burger Primary Care Physician:  Eunice Blase, PA-C Primary Gastroenterologist:  Dr. Charm Barges         Reason for Consultation:     Jaundice  Date of Admission:  12/17/2020 Date of Consultation:  12/19/2020         HPI:   [name] is a 45 y.o. female Was admitted last night with fall and weakness.  History of bipolar disorder alcohol and polysubstance use.  On admission Was have to have an elevated lactate of 2.5 Tylenol level not detected potassium of 2.5, sodium of 118 and severely low chloride of less than 65 BUN of 5 and creatinine of 0.37 noted to have elevated alkaline phosphatase of 495 and total bilirubin of 11.9 with direct component 7.2 and indirect component of 4.5 elevated AST of 228 and ALT of 34.  INR 1.2 hemoglobin noted in the urine or with bilirubin.Right upper quadrant ultrasound showed hepatic steatosis and small amount of right upper quadrant When I went to see the patient in her room she was very drowsy but finding it hard to stay awake and have a conversation with me.  That she drinks on most days of the week.  She indicated that she drinks 1/5 of a bottle but could not tell me what kind of alcohol she would consume.  states she has been incarcerated in the past and had tattoos professionally performed.  Denies any over-the-counter medications.  Denies any other complaints at this point of time including abdominal pain. Past Medical History:  Diagnosis Date  . Alcohol abuse   . Anxiety   . Anxiety   . Anxiety and depression   . Asthma   . Bipolar disorder ([name])   . Borderline personality disorder ([name])   . Depression   . Diabetes ([name])    Type 2   . Dyslipidemia   . Esophageal varices ([name])   . Foot drop    right  . FSHD (facioscapulohumeral muscular  dystrophy) ([name])   . GERD (gastroesophageal reflux disease)   . Gout   . History of drug abuse ([name])   . Insomnia   . Muscular dystrophies ([name])   . Neuropathy   . Pancreatitis   . 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 HERNIA REPAIR      Prior to Admission medications   Medication Sig Start Date End Date Taking? Authorizing Provider  albuterol (VENTOLIN HFA) 108 (90 Base) MCG/ACT inhaler Inhale 2 puffs into the lungs every 6 (six) hours as needed for wheezing. 09/15/18   Clapacs, Jackquline Denmark, MD  atorvastatin (LIPITOR) 40 MG tablet Take 1 tablet (40 mg total) by mouth daily. 09/15/18   Clapacs, Jackquline Denmark, MD  budesonide (ENTOCORT EC) 3 MG 24 hr capsule Take 9 mg by mouth daily. 12/09/20   [provider]  calcium-vitamin D (OSCAL WITH D) 500-200 MG-UNIT tablet Take 1 tablet by mouth daily with breakfast. 09/16/18   Clapacs, Jackquline Denmark, MD  cariprazine (VRAYLAR) capsule Take 1 capsule (3 mg total) by mouth daily. 09/15/18   Clapacs, Jackquline Denmark, MD  clonazePAM (KLONOPIN) 1 MG tablet Take 1 mg by mouth 3 (three) times daily as needed for anxiety.    [provider]  dicyclomine (  BENTYL) 20 MG tablet Take 20 mg by mouth 3 (three) times daily. 07/02/20   [provider]  diphenoxylate-atropine (LOMOTIL) 2.5-0.025 MG tablet Take 1 tablet by mouth in the morning, at noon, in the evening, and at bedtime. 07/07/20   [provider]  esomeprazole (NEXIUM) 40 MG capsule Take 40 mg by mouth daily. 09/30/20   [provider]  FLUoxetine (PROZAC) 40 MG capsule Take 2 capsules (80 mg total) by mouth daily. 09/15/18   Clapacs, Jackquline Denmark, MD  fluticasone (FLONASE) 50 MCG/ACT nasal spray Place 1 spray into both nostrils daily. 09/16/18   Clapacs, Jackquline Denmark, MD  JANUMET XR [phone] MG TB24 Take 1 tablet by mouth daily. 11/04/20   [provider]  metFORMIN (GLUCOPHAGE) 500 MG tablet Take 1 tablet (500 mg total) by mouth 2 (two) times daily  with a meal. 09/15/18   Clapacs, Jackquline Denmark, MD  methocarbamol (ROBAXIN) 500 MG tablet Take 500 mg by mouth 2 (two) times daily as needed. 12/01/20   [provider]  Multiple Vitamin (MULTIVITAMIN WITH MINERALS) TABS tablet Take 1 tablet by mouth daily. 09/16/18   Clapacs, Jackquline Denmark, MD  MYRBETRIQ 50 MG TB24 tablet Take 50 mg by mouth daily. 11/14/20   [provider]  naloxone [employer]) nasal spray 4 mg/0.1 mL Take as needed for opioid overdose 01/22/19   Shaune Pollack, MD  naproxen (NAPROSYN) 250 MG tablet Take 1 tablet (250 mg total) by mouth 3 times/day as needed-between meals & bedtime for mild pain. 09/15/18   Clapacs, Jackquline Denmark, MD  Norgestimate-Ethinyl Estradiol Triphasic 0.18/0.215/0.25 MG-35 MCG tablet Take 1 tablet by mouth daily.    [provider]  OZEMPIC, 0.25 OR 0.5 MG/DOSE, 2 MG/1.5ML SOPN Inject into the skin. 12/15/20   [provider]  pantoprazole (PROTONIX) 40 MG tablet Take 1 tablet (40 mg total) by mouth daily. 09/16/18   Clapacs, Jackquline Denmark, MD  pregabalin (LYRICA) 75 MG capsule Take 75-150 mg by mouth 3 (three) times daily. 11/16/20   [provider]  promethazine (PHENERGAN) 25 MG tablet Take 25 mg by mouth every 6 (six) hours as needed for nausea/vomiting. 12/01/20   [provider]  topiramate (TOPAMAX) 200 MG tablet Take 1 tablet (200 mg total) by mouth at bedtime. 09/15/18   Clapacs, Jackquline Denmark, MD    Family History  Problem Relation Age of Onset  . Heart disease Mother   . Heart attack Mother   . Hyperlipidemia Maternal Grandmother   . Diabetes Maternal Grandmother   . Hypertension Maternal Grandmother   . Hyperlipidemia Maternal Grandfather   . Heart disease Maternal Grandfather   . Heart attack Maternal Grandfather   . Diabetes Maternal Grandfather   . Lung cancer Maternal Grandfather   . Hypertension Maternal Grandfather   . Other Paternal Grandmother        Thyroid problems   . Heart disease Paternal Grandfather   . Stroke  Paternal Grandfather   . Hyperlipidemia Paternal Grandfather   . Heart attack Paternal Grandfather   . Hypertension Paternal Grandfather      Social History   Tobacco Use  . Smoking status: Every Day    Packs/day: 0.50    Types: Cigarettes  . Smokeless tobacco: Never  Substance Use Topics  . Alcohol use: Yes  . Drug use: Yes    Types: IV, Cocaine    Allergies as of 12/17/2020 - Review Complete 12/17/2020  Allergen Reaction Noted  . Erythromycin Anaphylaxis 11/13/2011  . Sulfa  antibiotics Anaphylaxis 11/13/2011  . Tylenol [acetaminophen] Other (See Comments) 09/26/2012  . Nsaids Other (See Comments) 09/26/2012  . Gabapentin  03/27/2018    Review of Systems:    All systems reviewed and negative except where noted in HPI.   Physical Exam:  Vital signs in last 24 hours: Temp:  [98 F (36.7 C)-98.7 F (37.1 C)] 98.7 F (37.1 C) (10/03 1100) Pulse Rate:  [99-109] 101 (10/03 1100) Resp:  [15-20] 20 (10/03 1100) BP: (100-131)/(58-79) 121/77 (10/03 1100) SpO2:  [90 %-98 %] 90 % (10/03 1100) Weight:  [105.8 kg] 105.8 kg (10/02 1746) Last BM Date: 12/18/20 General:   Very drowsy Head:  Normocephalic and atraumatic. Eyes: Icterus present 3+ conjunctiva pink. PERRLA. Ears:  Normal auditory acuity. Neck:  Supple; no masses or thyroidomegaly Lungs: Respirations even and unlabored. Lungs clear to auscultation bilaterally.   No wheezes, crackles, or rhonchi.  Heart:  Regular rate and rhythm;  Without murmur, clicks, rubs or gallops Abdomen:  Soft, nondistended, nontender. Normal bowel sounds. No appreciable masses or hepatomegaly.  No rebound or guarding.  Neurologic:  Alert and oriented x2;  grossly normal neurologically. Psych: Very drowsy  LAB RESULTS: Recent Labs    12/17/20 2335 12/19/20 0541  WBC 11.2* 16.9*  HGB 13.4 9.0*  HCT 39.6 26.7*  PLT 132* 123*   BMET Recent Labs    12/18/20 2152 12/19/20 0541 12/19/20 1043  NA 120* 122* 123*  K 2.7* 2.7* 2.6*  CL  70* 73* 76*  CO2 38* 38* 37*  GLUCOSE 163* 181* 170*  BUN <5* <5* <5*  CREATININE <0.30* <0.30* <0.30*  CALCIUM 7.0* 6.9* 6.9*   LFT Recent Labs    12/19/20 0541  PROT 5.9*  ALBUMIN <1.5*  AST 209*  ALT 31  ALKPHOS 338*  BILITOT 12.9*  BILIDIR 7.8*  IBILI 5.1*   PT/INR Recent Labs    12/19/20 0541  LABPROT 15.6*  INR 1.2    STUDIES: CT HEAD WO CONTRAST ( )  Result Date: 12/17/2020 CLINICAL DATA:  Patient found down. EXAM: CT HEAD WITHOUT CONTRAST TECHNIQUE: Contiguous axial images were obtained from the base of the skull through the vertex without intravenous contrast. COMPARISON:  None. FINDINGS: Brain: No evidence of acute infarction, hemorrhage, hydrocephalus, extra-axial collection or mass lesion/mass effect. Vascular: No hyperdense vessel or unexpected calcification. Skull: Normal. Negative for fracture or focal lesion. Sinuses/Orbits: No acute finding. Other: None. IMPRESSION: No acute intracranial pathology. Electronically Signed   By: Aram Candela M.D.   On: 12/17/2020 23:51   DG Chest Portable 1 View  Result Date: 12/17/2020 CLINICAL DATA:  Hypoxia EXAM: PORTABLE CHEST 1 VIEW COMPARISON:  11/16/2020 FINDINGS: Cardiac shadow is stable. Left lung remains clear. Elevation of the right hemidiaphragm is noted. Right basilar effusion and underlying atelectasis/infiltrate is present. IMPRESSION: New right basilar infiltrate and effusion. Electronically Signed   By: Alcide Clever M.D.   On: 12/17/2020 23:42   US ABDOMEN LIMITED RUQ (LIVER/GB)  Result Date: 12/18/2020 CLINICAL DATA:  Elevated liver function tests. EXAM: ULTRASOUND ABDOMEN LIMITED RIGHT UPPER QUADRANT COMPARISON:  None. FINDINGS: Gallbladder: No gallstones or wall thickening visualized (2.7 mm). No sonographic Murphy sign noted by sonographer. Common bile duct: Diameter: 2.3 mm Liver: No focal lesion identified. Diffusely increased echogenicity of the liver parenchyma is noted. Portal vein is patent on  color Doppler imaging with normal direction of blood flow towards the liver. Other: A small amount of ascites is seen within the right upper quadrant. IMPRESSION: 1. Hepatic steatosis.  2. Small amount of right upper quadrant ascites. Electronically Signed   By: Aram Candela M.D.   On: 12/18/2020 02:03      Impression / Plan:   AMARIYA LISKEY is a 45 y.o. y/o female with admitted after a fall, concern for pneumonia, hypovolemic hyponatremia.  I have been contacted for abnormal LFTs and jaundice.  Differentials include ischemic hepatitis likely secondary to hypovolemia, pneumonia to can cause hepatitis.  In addition I do believe she has had possibly alcoholic hepatitis from significant intake of alcohol.  Presently has features of encephalopathy likely a combination of metabolic and hepatic encephalopathy.  History of polysubstance use with cocaine seen in the urine.  She has a background of possible alcoholic liver disease with a low platelet count on admission.   Plan 1.  Check CK as rhabdomyolysis will cause an elevated AST as well as appearance of blood in the urine.  Check TSH due to low electrolytes. 2.  For abnormal LFTs would recommend full viral hepatitis screen which I will order.  Doppler ultrasound of the liver.  If negative will need MRCP.  Avoid any n hepatotoxic drugs 3.  Rehydrate and fluid resuscitate which should improve liver perfusion if it is ischemic hepatitis is expected to get worse before getting better.  Monitor INR daily. 4.  Continue IV vitamins to prevent Wernicke's encephalopathy. 5.  I will commence her on lactulose titrated to 2 soft bowel movements per day.  We will also add Xifaxan for hepatic encephalopathy.  There is no role in checking daily ammonia levels.  Hepatic encephalopathy the clinical diagnosis. 6.  Continue CIWA protocol 7.  Long-term she has to stop drinking alcohol. 8.  Dietary consult will be recommended.  Thank you for involving me in the care of  this patient.      LOS: 1 day   Kellie Mood, MD  12/19/2020, 12:50 PM

## 2020-12-19 NOTE — Progress Notes (Addendum)
Inpatient Diabetes Program Recommendations  AACE/ADA: New Consensus Statement on Inpatient Glycemic Control (2015)  Target Ranges:  Prepandial:   less than 140 mg/dL      Peak postprandial:   less than 180 mg/dL (1-2 hours)      Critically ill patients:  140 - 180 mg/dL  Results for [name], [name] (MRN [id number]) as of 12/19/2020 09:19  Ref. Range 12/18/2020 07:28 12/18/2020 11:47 12/18/2020 16:25  Glucose-Capillary Latest Ref Range: 70 - 99 mg/dL 276 (H)  11 units Novolog  197 (H)  4 units Novolog  239 (H)  7 units Novolog   Results for Kellie, Dixon (MRN [id number]) as of 12/19/2020 09:19  Ref. Range 12/19/2020 08:49  Glucose-Capillary Latest Ref Range: 70 - 99 mg/dL 206 (H)  7 units Novolog   Results for Kellie, Dixon (MRN [id number]) as of 12/19/2020 09:19  Ref. Range 12/18/2020 06:21  Hemoglobin A1C Latest Ref Range: 4.8 - 5.6 % 11.1 (H)  (272 mg/dl)   Admit with: Fall/ Aspiration Pneumonia/ Sepsis/ Hyponatremia/ Alcohol liver disease with hepatic steatosis and ascites Severe hyperbilirubinemia/jaundice/ Elevated ammonia level  History: DM2, Bipolar disorder, Polysubstance Abuse  Home DM Meds: Janumet XR 100/1000 mg Daily       Metformin 500 mg BID       Ozempic Qweek  Current Orders: Novolog Resistant Correction Scale/ SSI (0-20 units) TID AC + HS    PCP: Janine Limbo, PA with [facility]   MD- Note AM CBGs have been elevated the last 2 days.  Please consider starting Semglee 8 units Daily (0.075 units/kg)  MD- Pt has not been taking her Ozempic for 2 months now due to being evicted from her home and having to leave it behind.  Does not have CBG at home either.  Would you consider giving pt Rxs for Ozempic and CBG meter at time of d/c?   Addendum 11:50am--Met w/ pt at bedside this AM.  Cousin at bedside as well (pt lives with cousin).  Pt very sleepy and hard to engage in conversation but she did answer several questions for me but would quickly close her eyes  and go back to sleep.  Told me she was forced to move from her home about 2 months ago (evicted??).  Had to lave all her belongings behind and does not have her Ozempic nor her CBG meter.  Has not been taking her Ozempic for 2 months.  States she has been inconsistently taking her Janumet since she had COVID several months ago.  We discussed the importance of taking her meds as prescribed and that she needs to follow up with her PCP to make sure she can get her Rxs refilled and renewed.  Reviewed her current A1c of 11.1%.  Pt surprised to hear it was so high, however, I reminded her that not taking her meds has likely had a large impact on her A1c.  Reminded patient that her goal A1c is 7% or less per ADA standards to prevent both acute and long-term complications. Explained to patient the extreme importance of good glucose control at home.  Encouraged patient to check her CBGs at least bid at home (fasting and another check within the day) and to record all CBGs in a logbook for her PCP to review. Pt and cousin appreciative of visit.    --Will follow patient during hospitalization--  Wyn Quaker RN, MSN, CDE Diabetes Coordinator Inpatient Glycemic Control Team Team Pager: [phone] (8a-5p)

## 2020-12-19 NOTE — Progress Notes (Signed)
  Progress Note    [name]   WIO:[id number]  DOB: [birth year]  DOA: 12/17/2020     1 Date of Service: 12/19/2020     Subjective:  Patient barely arousable to loud verbal stimuli - falls back to sleep easily. Cousin at bedside.  Hospital Problems * Sepsis [facility]) Due to aspiration pneumonia, continue IV unasyn.  Alcoholic liver disease ([name]) Counseled. This must stop  Lactic acidosis Improving. This was likely due to sepsis and ischemia/low blood flow state.  Hyperbilirubinemia GI c/s - getting hepatitis panel, likely due to ischemic hepatitis. Getting doppler liver per GI, if neg and if bilirubin continues to trend up - will need MRCP.  Hyperglycemia due to type 2 diabetes mellitus ([name]) a1c 11.1 - continue SSI and add semglee 8 units for better sugar control  Polysubstance abuse ([name]) UDS + for cocaine and benzo  Hepatic steatosis Needs to stop alcohol  Acute hyponatremia Slowly improving with hydration. nephro on board. 113->122. Encouraged PO intake  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[name]) Falls at home - PT recommends SNF  Hypokalemia Pharmacy managing electrolytes aggressively.  Bipolar 2 disorder ([name]) Consider psych c/s      Objective Vital signs were reviewed and unremarkable except for: Pulse: Tachycardic   Exam Physical Exam   General exam: Appears calm and comfortable  Respiratory system: Clear to auscultation. Respiratory effort normal. Cardiovascular system: S1 & S2 heard, RRR. No JVD, murmurs, rubs, gallops or clicks. No pedal edema. Gastrointestinal system: Abdomen is nondistended, soft and nontender. No organomegaly or masses felt. Normal bowel sounds heard. Central nervous system: Drowsy. No focal neurological deficits. Extremities: Symmetric 5 x 5 power. Skin: No rashes, lesions or ulcers Psychiatry: Flat affect,  Labs / Other Information My review of labs, imaging, notes and other tests is significant for Electrolyte  abnormalities, hyperbilirubnemia, TSH elevated     Time spent: 35 mins [facility] 12/19/2020, 10:01 PM

## 2020-12-19 NOTE — Assessment & Plan Note (Signed)
Needs to stop alcohol

## 2020-12-19 NOTE — Assessment & Plan Note (Signed)
Consider psych c/s

## 2020-12-19 NOTE — Progress Notes (Signed)
[facility]  ROUNDING NOTE   Subjective:   [name] is a 45 year old Caucasian female with a past medical history of bipolar disorder, diabetes mellitus, alcohol abuse, polysubstance abuse, muscular dystrophy who was brought to the ER with chief complaint of fall around 2 to 3 days back.  Patient seen laying in bed Alert and oriented Reports decreased appetite due to nausea Denies shortness of breath, currently on 2L [place] Room air at baseline Denies pain and discomfort, aside from nausea    Objective:  Vital signs in last 24 hours:  Temp:  [98 F (36.7 C)-98.7 F (37.1 C)] 98.7 F (37.1 C) (10/03 1100) Pulse Rate:  [99-109] 101 (10/03 1100) Resp:  [17-20] 20 (10/03 1100) BP: (100-131)/(58-79) 121/77 (10/03 1100) SpO2:  [90 %-98 %] 90 % (10/03 1100) Weight:  [105.8 kg] 105.8 kg (10/02 1746)  Weight change: 15.1 kg Filed Weights   12/17/20 2255 12/18/20 1746  Weight: 90.7 kg 105.8 kg    Intake/Output: I/O last 3 completed shifts: In: 4354.3 [P.O.:240; I.V.:2537.7; IV Piggyback:1576.7] Out: 0    Intake/Output this shift:  Total I/O In: 1854.5 [P.O.:240; I.V.:1006; IV Piggyback:608.4] Out: -   Physical Exam: General: NAD  Head: Normocephalic, atraumatic. Dry oral mucosal membranes  Eyes: Jaundiced sclera  Lungs:  Clear to auscultation, normal effort, [place] O2  Heart: Regular rate and rhythm  Abdomen:  Soft, nontender,   Extremities:  1+ BLE peripheral edema. 2+ BUE  Neurologic: Alert moving all four extremities  Skin: Jaundiced, generalized bruising       Basic Metabolic Panel: Recent Labs  Lab 12/18/20 1205 12/18/20 1758 12/18/20 2152 12/19/20 0541 12/19/20 1043  NA 118* 118* 120* 122* 123*  K 2.4* 2.7* 2.7* 2.7* 2.6*  CL 66* 69* 70* 73* 76*  CO2 38* 37* 38* 38* 37*  GLUCOSE 193* 182* 163* 181* 170*  BUN <5* <5* <5* <5* <5*  CREATININE 0.31* <0.30* <0.30* <0.30* <0.30*  CALCIUM 7.0* 7.3* 7.0* 6.9* 6.9*  MG  --   --   --  1.6*  --    PHOS  --   --   --  1.0*  --     Liver Function Tests: Recent Labs  Lab 12/17/20 2335 12/18/20 1205 12/19/20 0541  AST 283* 228* 209*  ALT 39 34 31  ALKPHOS 495* 362* 338*  BILITOT 11.9* 11.7* 12.9*  PROT 6.7 6.1* 5.9*  ALBUMIN 1.8* <1.5* <1.5*   No results for input(s): LIPASE, AMYLASE in the last 168 hours. Recent Labs  Lab 12/17/20 2335 12/19/20 0541  AMMONIA 52* 81*    CBC: Recent Labs  Lab 12/17/20 2335 12/19/20 0541  WBC 11.2* 16.9*  NEUTROABS  --  14.0*  HGB 13.4 9.0*  HCT 39.6 26.7*  MCV 93.8 99.6  PLT 132* 123*    Cardiac Enzymes: Recent Labs  Lab 12/17/20 2335  CKTOTAL 153    BNP: Invalid input(s): POCBNP  CBG: Recent Labs  Lab 12/18/20 0728 12/18/20 1147 12/18/20 1625 12/19/20 0849  GLUCAP 276* 197* 239* 206*    Microbiology: Results for orders placed or performed during the hospital encounter of 12/17/20  Resp Panel by RT-PCR (Flu A&B, Covid) Nasopharyngeal Swab     Status: None   Collection Time: 12/18/20  1:16 AM   Specimen: Nasopharyngeal Swab; Nasopharyngeal(NP) swabs in vial transport medium  Result Value Ref Range Status   SARS Coronavirus 2 by RT PCR NEGATIVE NEGATIVE Final    Comment: (NOTE) SARS-CoV-2 target nucleic acids are  NOT DETECTED.  The SARS-CoV-2 RNA is generally detectable in upper respiratory specimens during the acute phase of infection. The lowest concentration of SARS-CoV-2 viral copies this assay can detect is 138 copies/mL. A negative result does not preclude SARS-Cov-2 infection and should not be used as the sole basis for treatment or other patient management decisions. A negative result may occur with  improper specimen collection/handling, submission of specimen other than nasopharyngeal swab, presence of viral mutation(s) within the areas targeted by this assay, and inadequate number of viral copies(<138 copies/mL). A negative result must be combined with clinical observations, patient history,  and epidemiological information. The expected result is Negative.  Fact Sheet for Patients:  BloggerCourse.com  Fact Sheet for Healthcare Providers:  SeriousBroker.it  This test is no t yet approved or cleared by the [place] FDA and  has been authorized for detection and/or diagnosis of SARS-CoV-2 by FDA under an Emergency Use Authorization (EUA). This EUA will remain  in effect (meaning this test can be used) for the duration of the COVID-19 declaration under Section 564(b)(1) of the Act, 21 U.S.C.section 360bbb-3(b)(1), unless the authorization is terminated  or revoked sooner.       Influenza A by PCR NEGATIVE NEGATIVE Final   Influenza B by PCR NEGATIVE NEGATIVE Final    Comment: (NOTE) The Xpert Xpress SARS-CoV-2/FLU/RSV plus assay is intended as an aid in the diagnosis of influenza from Nasopharyngeal swab specimens and should not be used as a sole basis for treatment. Nasal washings and aspirates are unacceptable for Xpert Xpress SARS-CoV-2/FLU/RSV testing.  Fact Sheet for Patients: BloggerCourse.com  Fact Sheet for Healthcare Providers: SeriousBroker.it  This test is not yet approved or cleared by the [place] FDA and has been authorized for detection and/or diagnosis of SARS-CoV-2 by FDA under an Emergency Use Authorization (EUA). This EUA will remain in effect (meaning this test can be used) for the duration of the COVID-19 declaration under Section 564(b)(1) of the Act, 21 U.S.C. section 360bbb-3(b)(1), unless the authorization is terminated or revoked.  Performed at [facility], [address] [place], [place] [address]   Blood culture (routine x 2)     Status: None (Preliminary result)   Collection Time: 12/18/20  1:16 AM   Specimen: BLOOD  Result Value Ref Range Status   Specimen Description BLOOD RIGHT HAND  Final   Special Requests    Final    BOTTLES DRAWN AEROBIC AND ANAEROBIC Blood Culture results may not be optimal due to an inadequate volume of blood received in culture bottles   Culture   Final    NO GROWTH 1 DAY Performed at [facility], [address], [place], [place] [address]    Report Status PENDING  Incomplete  Blood culture (routine x 2)     Status: None (Preliminary result)   Collection Time: 12/18/20  1:16 AM   Specimen: BLOOD  Result Value Ref Range Status   Specimen Description BLOOD RIGHT ASSIST CONTROL  Final   Special Requests   Final    BOTTLES DRAWN AEROBIC AND ANAEROBIC Blood Culture adequate volume   Culture   Final    NO GROWTH 1 DAY Performed at [facility], [address], [place], [place] [address]    Report Status PENDING  Incomplete    Coagulation Studies: Recent Labs    12/19/20 0541  LABPROT 15.6*  INR 1.2    Urinalysis: Recent Labs    12/19/20 1110  COLORURINE AMBER*  LABSPEC 1.008  PHURINE 7.0  GLUCOSEU NEGATIVE  HGBUR MODERATE*  BILIRUBINUR MODERATE*  KETONESUR NEGATIVE  PROTEINUR NEGATIVE  NITRITE NEGATIVE  LEUKOCYTESUR NEGATIVE      Imaging: CT HEAD WO CONTRAST ( )  Result Date: 12/17/2020 CLINICAL DATA:  Patient found down. EXAM: CT HEAD WITHOUT CONTRAST TECHNIQUE: Contiguous axial images were obtained from the base of the skull through the vertex without intravenous contrast. COMPARISON:  None. FINDINGS: Brain: No evidence of acute infarction, hemorrhage, hydrocephalus, extra-axial collection or mass lesion/mass effect. Vascular: No hyperdense vessel or unexpected calcification. Skull: Normal. Negative for fracture or focal lesion. Sinuses/Orbits: No acute finding. Other: None. IMPRESSION: No acute intracranial pathology. Electronically Signed   By: Aram Candela M.D.   On: 12/17/2020 23:51   DG Chest Portable 1 View  Result Date: 12/17/2020 CLINICAL DATA:  Hypoxia EXAM: PORTABLE CHEST 1 VIEW COMPARISON:  11/16/2020 FINDINGS:  Cardiac shadow is stable. Left lung remains clear. Elevation of the right hemidiaphragm is noted. Right basilar effusion and underlying atelectasis/infiltrate is present. IMPRESSION: New right basilar infiltrate and effusion. Electronically Signed   By: Alcide Clever M.D.   On: 12/17/2020 23:42   US ABDOMEN LIMITED RUQ (LIVER/GB)  Result Date: 12/18/2020 CLINICAL DATA:  Elevated liver function tests. EXAM: ULTRASOUND ABDOMEN LIMITED RIGHT UPPER QUADRANT COMPARISON:  None. FINDINGS: Gallbladder: No gallstones or wall thickening visualized (2.7 mm). No sonographic Murphy sign noted by sonographer. Common bile duct: Diameter: 2.3 mm Liver: No focal lesion identified. Diffusely increased echogenicity of the liver parenchyma is noted. Portal vein is patent on color Doppler imaging with normal direction of blood flow towards the liver. Other: A small amount of ascites is seen within the right upper quadrant. IMPRESSION: 1. Hepatic steatosis. 2. Small amount of right upper quadrant ascites. Electronically Signed   By: Aram Candela M.D.   On: 12/18/2020 02:03     Medications:    sodium chloride Stopped (12/18/20 2305)   0.9 % NaCl with KCl 20 mEq / L 100 mL/hr at 12/19/20 1513   ampicillin-sulbactam (UNASYN) IV Stopped (12/19/20 1246)   potassium PHOSPHATE IVPB (in mmol) 30 mmol (12/19/20 1100)    enoxaparin (LOVENOX) injection  0.5 mg/kg Subcutaneous Q24H   insulin aspart  0-20 Units Subcutaneous TID WC   insulin aspart  0-5 Units Subcutaneous QHS   LORazepam  0-4 mg Intravenous Q6H   Or   LORazepam  0-4 mg Oral Q6H   [START ON 12/20/2020] LORazepam  0-4 mg Intravenous Q12H   Or   [START ON 12/20/2020] LORazepam  0-4 mg Oral Q12H   pantoprazole (PROTONIX) IV  40 mg Intravenous Q12H   rifaximin  550 mg Oral BID   thiamine  100 mg Oral Daily   Or   thiamine  100 mg Intravenous Daily   sodium chloride, acetaminophen **OR** acetaminophen, albuterol, lactulose, methocarbamol, ondansetron **OR**  ondansetron (ZOFRAN) IV, traMADol  Assessment/ Plan:  Ms. SARAHBETH CASHIN is a 45 y.o.  female with a past medical history of bipolar disorder, diabetes mellitus, alcohol abuse, polysubstance abuse, muscular dystrophy who was brought to the ER with chief complaint of fall around 2 to 3 days back.    Severe hyponatremia believed due to hypovolemia due to patient statements of not eating of 2 days while laying on floor. Also believed to have low osmolar intake resulting in BUN <5. Goal correction of 8 mEq /24 hrs. Sodium level of 113 on admission. Tolvaptan held due to liver failure. Hypertonic solution held due to improvement with patient presentation.  Sodium currently 122. Receiving supportive care for nausea, encouraging patient to increase oral intake.   Sepsis likely due to aspiration pneumonia. Chest X-ray show right lower lobe infiltrates. Receiving antibiotics  Alcoholic liver disease with elevated total bilirubin and ammonia level. CIWA protocol in place. Primary team following  Hypokalemia Currently level 2.6. receiving IV supplementation.    LOS: 1   10/3/20223:39 PM

## 2020-12-19 NOTE — Assessment & Plan Note (Signed)
Improving. This was likely due to sepsis and ischemia/low blood flow state.

## 2020-12-20 ENCOUNTER — Inpatient Hospital Stay: Payer: Medicare Other

## 2020-12-20 DIAGNOSIS — R7989 Other specified abnormal findings of blood chemistry: Secondary | ICD-10-CM | POA: Diagnosis not present

## 2020-12-20 DIAGNOSIS — G9340 Encephalopathy, unspecified: Secondary | ICD-10-CM

## 2020-12-20 DIAGNOSIS — K709 Alcoholic liver disease, unspecified: Secondary | ICD-10-CM | POA: Diagnosis not present

## 2020-12-20 DIAGNOSIS — K7682 Hepatic encephalopathy: Secondary | ICD-10-CM | POA: Diagnosis present

## 2020-12-20 DIAGNOSIS — F1020 Alcohol dependence, uncomplicated: Secondary | ICD-10-CM | POA: Diagnosis not present

## 2020-12-20 DIAGNOSIS — E871 Hypo-osmolality and hyponatremia: Secondary | ICD-10-CM | POA: Diagnosis not present

## 2020-12-20 DIAGNOSIS — A419 Sepsis, unspecified organism: Secondary | ICD-10-CM | POA: Diagnosis not present

## 2020-12-20 LAB — COMPREHENSIVE METABOLIC PANEL
ALT: 31 U/L (ref 0–44)
AST: 212 U/L — ABNORMAL HIGH (ref 15–41)
Albumin: <1.5 g/dL — ABNORMAL LOW (ref 3.5–5.0)
Alkaline Phosphatase: 339 U/L — ABNORMAL HIGH (ref 38–126)
Anion gap: 9 (ref 5–15)
BUN: <5 mg/dL — ABNORMAL LOW (ref 6–20)
CO2: 38 mmol/L — ABNORMAL HIGH (ref 22–32)
Calcium: 7.2 mg/dL — ABNORMAL LOW (ref 8.9–10.3)
Chloride: 82 mmol/L — ABNORMAL LOW (ref 98–111)
Creatinine, Ser: <0.3 mg/dL — ABNORMAL LOW (ref 0.44–1.00)
Glucose, Bld: 220 mg/dL — ABNORMAL HIGH (ref 70–99)
Potassium: 3.9 mmol/L (ref 3.5–5.1)
Sodium: 129 mmol/L — ABNORMAL LOW (ref 135–145)
Total Bilirubin: 15.2 mg/dL — ABNORMAL HIGH (ref 0.3–1.2)
Total Protein: 6 g/dL — ABNORMAL LOW (ref 6.5–8.1)

## 2020-12-20 LAB — CBC
HCT: 29.4 % — ABNORMAL LOW (ref 36.0–46.0)
Hemoglobin: 9 g/dL — ABNORMAL LOW (ref 12.0–15.0)
MCH: 31.7 pg (ref 26.0–34.0)
MCHC: 30.6 g/dL (ref 30.0–36.0)
MCV: 103.5 fL — ABNORMAL HIGH (ref 80.0–100.0)
Platelets: 94 10*3/uL — ABNORMAL LOW (ref 150–400)
RBC: 2.84 MIL/uL — ABNORMAL LOW (ref 3.87–5.11)
RDW: 22.1 % — ABNORMAL HIGH (ref 11.5–15.5)
WBC: 18.1 10*3/uL — ABNORMAL HIGH (ref 4.0–10.5)
nRBC: 1.1 % — ABNORMAL HIGH (ref 0.0–0.2)

## 2020-12-20 LAB — HCV RNA QUANT: HCV Quantitative: NOT DETECTED IU/mL (ref >50)

## 2020-12-20 LAB — HEPATITIS B CORE ANTIBODY, IGM: Hep B C IgM: NONREACTIVE

## 2020-12-20 LAB — T4, FREE: Free T4: 1.12 ng/dL (ref 0.61–1.12)

## 2020-12-20 LAB — BASIC METABOLIC PANEL
Anion gap: 11 (ref 5–15)
BUN: <5 mg/dL — ABNORMAL LOW (ref 6–20)
CO2: 32 mmol/L (ref 22–32)
Calcium: 7.3 mg/dL — ABNORMAL LOW (ref 8.9–10.3)
Chloride: 90 mmol/L — ABNORMAL LOW (ref 98–111)
Creatinine, Ser: <0.3 mg/dL — ABNORMAL LOW (ref 0.44–1.00)
Glucose, Bld: 147 mg/dL — ABNORMAL HIGH (ref 70–99)
Potassium: 5.2 mmol/L — ABNORMAL HIGH (ref 3.5–5.1)
Sodium: 133 mmol/L — ABNORMAL LOW (ref 135–145)

## 2020-12-20 LAB — MAGNESIUM
Magnesium: 1.9 mg/dL (ref 1.7–2.4)
Magnesium: 1.7 mg/dL (ref 1.7–2.4)

## 2020-12-20 LAB — HEPATITIS B DNA, ULTRAQUANTITATIVE, PCR
HBV DNA SERPL PCR-ACNC: NOT DETECTED IU/mL
HBV DNA SERPL PCR-LOG IU: UNDETERMINED log10 IU/mL

## 2020-12-20 LAB — GLUCOSE, CAPILLARY
Glucose-Capillary: 209 mg/dL — ABNORMAL HIGH (ref 70–99)
Glucose-Capillary: 162 mg/dL — ABNORMAL HIGH (ref 70–99)
Glucose-Capillary: 131 mg/dL — ABNORMAL HIGH (ref 70–99)
Glucose-Capillary: 134 mg/dL — ABNORMAL HIGH (ref 70–99)

## 2020-12-20 LAB — SODIUM
Sodium: 129 mmol/L — ABNORMAL LOW (ref 135–145)
Sodium: 132 mmol/L — ABNORMAL LOW (ref 135–145)

## 2020-12-20 LAB — PHOSPHORUS: Phosphorus: UNDETERMINED mg/dL (ref 2.5–4.6)

## 2020-12-20 LAB — HEPATITIS A ANTIBODY, IGM: Hep A IgM: NONREACTIVE

## 2020-12-20 MED ORDER — ENSURE ENLIVE PO LIQD
237.0000 mL | Freq: Two times a day (BID) | ORAL | Status: DC
  Administered 2020-12-21 – 2021-01-02 (×24): 237 mL via ORAL

## 2020-12-20 MED ORDER — VANCOMYCIN HCL 10 G IV SOLR
2000.0000 mg | Freq: Once | INTRAVENOUS | Status: DC
  Filled 2020-12-20: qty 20

## 2020-12-20 MED ORDER — LORAZEPAM 2 MG/ML IJ SOLN
0.5000 mg | Freq: Once | INTRAMUSCULAR | Status: AC | PRN
  Administered 2020-12-21: 0.5 mg via INTRAVENOUS
  Filled 2020-12-20: qty 1

## 2020-12-20 MED ORDER — POTASSIUM CHLORIDE CRYS ER 20 MEQ PO TBCR
40.0000 meq | EXTENDED_RELEASE_TABLET | Freq: Once | ORAL | Status: AC
  Administered 2020-12-20: 40 meq via ORAL
  Filled 2020-12-20: qty 2

## 2020-12-20 MED ORDER — VANCOMYCIN HCL IN DEXTROSE 1-5 GM/200ML-% IV SOLN
1000.0000 mg | Freq: Two times a day (BID) | INTRAVENOUS | Status: DC
  Administered 2020-12-20 – 2020-12-22 (×4): 1000 mg via INTRAVENOUS
  Filled 2020-12-20 (×6): qty 200

## 2020-12-20 MED ORDER — SODIUM CHLORIDE 0.9 % IV SOLN: INTRAVENOUS | Status: DC

## 2020-12-20 MED ORDER — INSULIN GLARGINE-YFGN 100 UNIT/ML ~~LOC~~ SOLN
8.0000 [IU] | Freq: Every day | SUBCUTANEOUS | Status: DC
  Administered 2020-12-20 – 2020-12-27 (×8): 8 [IU] via SUBCUTANEOUS
  Filled 2020-12-20 (×9): qty 0.08

## 2020-12-20 MED ORDER — VANCOMYCIN HCL 10 G IV SOLR
2000.0000 mg | Freq: Once | INTRAVENOUS | Status: AC
  Administered 2020-12-20: 2000 mg via INTRAVENOUS
  Filled 2020-12-20: qty 40

## 2020-12-20 NOTE — Assessment & Plan Note (Signed)
Continue lactulose, Xifaxan.  Dietary consult

## 2020-12-20 NOTE — Assessment & Plan Note (Signed)
Slowly improving with hydration. nephro on board. 113->129. Encouraged PO intake.  Follow-up done held due to liver failure.  Initially hypertonic saline used due to severe hyponatremia of 113.  No longer on hypertonic saline now

## 2020-12-20 NOTE — Assessment & Plan Note (Signed)
Repleted and resolved 

## 2020-12-20 NOTE — Consult Note (Addendum)
Pharmacy Antibiotic Note  [name] is a 45 y.o. female with PMH of bipolar disorder, diabetes, alcohol use disorder and polysubstance abuse, FSH muscular dystrophy, with history of falls who was admitted on 12/17/2020 with  aspiration pneumonia  after falling and being down for 2 days and unable to get up. Pt was found to be jaundiced and with AST 283, alk phos 495, total bilirubin 11.9, and ammonia 52. Pharmacy has been consulted for vancomycin dosing.  Pt has remained afeb, w/ Bcx NG x2 days  Pt was initially treated with empiric unasyn, despite therapy WBC have continued to increase 11.2>16.9>18.1.   Given increased leukocytosis despite abx and inc'd MRSA risk given EtOH history checked MRSA PCR which returned positive. Therapy was escalated from Unasyn to Vancomycin  Plan: Vancomycin  -Will give vancomycin $RemoveBeforeD'2000mg'fENvKVWzuHUbsb$  IV x1 for LD -Will start vancomycin $RemoveBeforeDE'1000mg'TdlxraJDpbkHuNa$  IV q12h following the LD    -Est AUC 492.1    -Css min 13.5    -Scr used: 0.8    -IBW used and Vd of 0.5 -Will obtain labs around the 4th or 5th dose if vancomycin is continued   -Will continue to monitor renal function and adjust dose as clinically indicated   Height: $Remove'5\' 7"'jfvBfqH$  (170.2 cm) Weight: 105.8 kg (233 lb 4.8 oz) IBW/kg (Calculated) : 61.6  Temp (24hrs), Avg:98.4 F (36.9 C), Min:97.9 F (36.6 C), Max:98.7 F (37.1 C)  Recent Labs  Lab 12/17/20 2335 12/18/20 0116 12/18/20 0621 12/18/20 1205 12/19/20 0541 12/19/20 1043 12/19/20 1547 12/19/20 2233 12/20/20 0534  WBC 11.2*  --   --   --  16.9*  --   --   --  18.1*  CREATININE <0.30*  --  0.37*   < > <0.30* <0.30* <0.30* <0.30* <0.30*  LATICACIDVEN  --  2.5* 1.7  --   --   --   --   --   --    < > = values in this interval not displayed.    CrCl cannot be calculated (This lab value cannot be used to calculate CrCl because it is not a number: <0.30).    Allergies  Allergen Reactions   Erythromycin Anaphylaxis   Sulfa Antibiotics Anaphylaxis   Tylenol  [Acetaminophen] Other (See Comments)    Pancreatitis and liver dysfunction, not supposed to take med   Nsaids Other (See Comments)    Causes GI problems, very bad reaction-per patient.    Gabapentin     Mood changes     Antimicrobials this admission: Ongoing Vancomycin 10/4 >>   Completed Unasyn 10/2 >> 10/4  Microbiology results: 10/2 BCx: NG x2 days 10/3 MRSA PCR: detected   Thank you for allowing pharmacy to be a part of this patient's care.  Narda Rutherford, PharmD Pharmacy Resident  12/20/2020 1:54 PM

## 2020-12-20 NOTE — Progress Notes (Signed)
[facility] for Electrolyte Monitoring and Replacement   Recent Labs: Potassium (mmol/L)  Date Value  12/19/2020 3.1 (L)   Magnesium (mg/dL)  Date Value  12/19/2020 2.0   Calcium (mg/dL)  Date Value  12/19/2020 7.1 (L)   Albumin (g/dL)  Date Value  12/19/2020 <1.5 (L)   Phosphorus (mg/dL)  Date Value  12/19/2020 1.2 (L)   Sodium (mmol/L)  Date Value  12/19/2020 129 (L)     Assessment: 45yo F with PMH Bipolar disorder, diabetes, alcohol use disorder and polysubstance abuse, FSH muscular dystrophy, with history of falls who was admitted to the hospital after she fell at home 2 days prior and was unable to get up. Pt was noted to be jaundiced on admin with AST 283, alk phos 495, total bilirubin 11.9, and ammonia 52.   Current electrolyte abnormalities likely due to refeeding syndrome/poor nutritional status given alcohol abuse  Pt currently has NS w/ 60mEq Kcl running at 195mL/hr   Goal of Therapy:  Electrolytes WNL  Plan:  10/3 @ 2233:  K 3.1, Na 129, Scr <0.30   -Will continue NS w/ 84mEq/L Kcl $RemoveBef'@100mL'cEInQqkgJf$ /hr -Will order potassium 40 mEq PO x 1, K Phos was just given @ 2122  -Will re-check mg and phos with AM labs, continue to monitor electrolytes and replace as indicated   Darnelle Bos ,PharmD Clinical Pharmacist 12/20/2020 12:02 AM

## 2020-12-20 NOTE — Progress Notes (Signed)
[name] , MD [address], [place], [place], [address] [address], [place], [place], [address] Phone: [phone]  Fax: [phone]   [name] is being followed for abnormal LFT's  Day 1 of follow up   Subjective:  She is still drowsy but has no other complaints   Objective: Vital signs in last 24 hours: Vitals:   12/20/20 0413 12/20/20 0746 12/20/20 0953 12/20/20 1133  BP: 104/73 (!) 86/57 98/64 94/66   Pulse: (!) 105 (!) 108 (!) 105 (!) 105  Resp: 18 20 20 18   Temp: 98.3 F (36.8 C) 98.7 F (37.1 C) 98 F (36.7 C) 97.9 F (36.6 C)  TempSrc: Oral Oral  Oral  SpO2: 93% 95% 95% 90%  Weight:      Height:       Weight change:   Intake/Output Summary (Last 24 hours) at 12/20/2020 1326 Last data filed at 12/20/2020 0900 Gross per 24 hour  Intake 3507.19 ml  Output 1200 ml  Net 2307.19 ml     Exam: Heart:: Regular rate and rhythm or without murmur or extra heart sounds Lungs: normal Abdomen: soft, nontender, normal bowel sounds   Lab Results: @LABTEST2 @ Micro Results: Recent Results (from the past 240 hour(s))  Resp Panel by RT-PCR (Flu A&B, Covid) Nasopharyngeal Swab     Status: None   Collection Time: 12/18/20  1:16 AM   Specimen: Nasopharyngeal Swab; Nasopharyngeal(NP) swabs in vial transport medium  Result Value Ref Range Status   SARS Coronavirus 2 by RT PCR NEGATIVE NEGATIVE Final    Comment: (NOTE) SARS-CoV-2 target nucleic acids are NOT DETECTED.  The SARS-CoV-2 RNA is generally detectable in upper respiratory specimens during the acute phase of infection. The lowest concentration of SARS-CoV-2 viral copies this assay can detect is 138 copies/mL. A negative result does not preclude SARS-Cov-2 infection and should not be used as the sole basis for treatment or other patient management decisions. A negative result may occur with  improper specimen collection/handling, submission of specimen other than nasopharyngeal  swab, presence of viral mutation(s) within the areas targeted by this assay, and inadequate number of viral copies(<138 copies/mL). A negative result must be combined with clinical observations, patient history, and epidemiological information. The expected result is Negative.  Fact Sheet for Patients:  002.002.002.002  Fact Sheet for Healthcare Providers:   This test is no t yet approved or cleared by the 02/17/21 FDA and  has been authorized for detection and/or diagnosis of SARS-CoV-2 by FDA under an Emergency Use Authorization (EUA). This EUA will remain  in effect (meaning this test can be used) for the duration of the COVID-19 declaration under Section 564(b)(1) of the Act, 21 U.S.C.section 360bbb-3(b)(1), unless the authorization is terminated  or revoked sooner.       Influenza A by PCR NEGATIVE NEGATIVE Final   Influenza B by PCR NEGATIVE NEGATIVE Final    Comment: (NOTE) The Xpert Xpress SARS-CoV-2/FLU/RSV plus assay is intended as an aid in the diagnosis of influenza from Nasopharyngeal swab specimens and should not be used as a sole basis for treatment. Nasal washings and aspirates are unacceptable for Xpert Xpress SARS-CoV-2/FLU/RSV testing.  Fact Sheet for Patients: BloggerCourse.com  Fact Sheet for Healthcare Providers: SeriousBroker.it  This test is not yet approved or cleared by the [place] FDA and has been authorized for detection and/or diagnosis of SARS-CoV-2 by FDA under an Emergency Use Authorization (EUA). This EUA will remain in effect (meaning this  test can be used) for the duration of the COVID-19 declaration under Section 564(b)(1) of the Act, 21 U.S.C. section 360bbb-3(b)(1), unless the authorization is terminated or revoked.  Performed at [facility], [address] [place], [place] [address]   Blood  culture (routine x 2)     Status: None (Preliminary result)   Collection Time: 12/18/20  1:16 AM   Specimen: BLOOD  Result Value Ref Range Status   Specimen Description BLOOD RIGHT HAND  Final   Special Requests   Final    BOTTLES DRAWN AEROBIC AND ANAEROBIC Blood Culture results may not be optimal due to an inadequate volume of blood received in culture bottles   Culture   Final    NO GROWTH 2 DAYS Performed at [facility], [address] [place], [place] [address]    Report Status PENDING  Incomplete  Blood culture (routine x 2)     Status: None (Preliminary result)   Collection Time: 12/18/20  1:16 AM   Specimen: BLOOD  Result Value Ref Range Status   Specimen Description BLOOD RIGHT ASSIST CONTROL  Final   Special Requests   Final    BOTTLES DRAWN AEROBIC AND ANAEROBIC Blood Culture adequate volume   Culture   Final    NO GROWTH 2 DAYS Performed at [facility], [address], [place], [place] [address]    Report Status PENDING  Incomplete  MRSA Next Gen by PCR, Nasal     Status: Abnormal   Collection Time: 12/19/20  4:04 PM   Specimen: Nasal Mucosa; Nasal Swab  Result Value Ref Range Status   MRSA by PCR Next Gen DETECTED (A) NOT DETECTED Final    Comment: RESULT CALLED TO, READ BACK BY AND VERIFIED WITH: AMY DOLTON @1830  ON 12/19/20 SKL (NOTE) The GeneXpert MRSA Assay (FDA approved for NASAL specimens only), is one component of a comprehensive MRSA colonization surveillance program. It is not intended to diagnose MRSA infection nor to guide or monitor treatment for MRSA infections. Test performance is not FDA approved in patients less than 20 years old. Performed at [facility], [address] [place], [place]    Studies/Results: [address] LIVER DOPPLER  Result Date: 12/20/2020 CLINICAL DATA:  Abnormal liver function tests. EXAM: DUPLEX ULTRASOUND OF LIVER TECHNIQUE: Color and duplex Doppler ultrasound was performed to evaluate  the hepatic in-flow and out-flow vessels. COMPARISON:  December 18, 2020. FINDINGS: Liver: Increased parenchymal echogenicity. Normal hepatic contour without nodularity. No focal lesion, mass or intrahepatic biliary ductal dilatation. Main Portal Vein size: 0.9 cm Portal Vein Velocities Main Prox:  29.4 cm/sec Main Mid: 24.8 cm/sec Main Dist:  22.2 cm/sec Right: 20.6 cm/sec Left: 19.0 cm/sec Normal hepatopetal flow is noted in the portal veins. Hepatic Vein Velocities Right:  Not visualized. Middle:  Not visualized. Left:  Not visualized. IVC: Present and patent with normal respiratory phasicity. Hepatic Artery Velocity:  48.1 cm/sec Splenic Vein Velocity:  10.1 cm/sec Spleen: 9.0 cm x 5.0 cm x 11.2 cm with a total volume of 266 cm^3 (411 cm^3 is upper limit normal) Portal Vein Occlusion/Thrombus: No Splenic Vein Occlusion/Thrombus: No Ascites: Present. Varices: None Limited exam due to body habitus and bowel gas. IMPRESSION: Limited exam due to body habitus and overlying bowel gas. There is no definite evidence of portal or splenic venous thrombosis. Due to above described limitations, hepatic veins are not visualized. Minimal to mild ascites is noted. Electronically Signed   By: December 20, 2020.D.  On: 12/20/2020 12:10   Medications: I have reviewed the patient's current medications. Scheduled Meds:  enoxaparin (LOVENOX) injection  0.5 mg/kg Subcutaneous Q24H   folic acid  1 mg Oral Daily   insulin aspart  0-20 Units Subcutaneous TID WC   insulin aspart  0-5 Units Subcutaneous QHS   insulin glargine-yfgn  8 Units Subcutaneous QHS   LORazepam  0-4 mg Intravenous Q12H   Or   LORazepam  0-4 mg Oral Q12H   multivitamin with minerals  1 tablet Oral Daily   pantoprazole (PROTONIX) IV  40 mg Intravenous Q12H   rifaximin  550 mg Oral BID   [START ON 12/23/2020] thiamine  100 mg Oral Daily   Continuous Infusions:  sodium chloride Stopped (12/18/20 2305)   0.9 % NaCl with KCl 40 mEq / L 100 mL/hr at  12/20/20 0644   thiamine injection 500 mg (12/20/20 0556)   vancomycin     PRN Meds:.sodium chloride, albuterol, lactulose, methocarbamol, ondansetron **OR** ondansetron (ZOFRAN) IV   Assessment: Principal Problem:   Sepsis ([name]) Active Problems:   Bipolar 2 disorder ([name])   Hypokalemia   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[name])   Acute hyponatremia   Alcohol use disorder, moderate, dependence ([name])   Hepatic steatosis   Polysubstance abuse ([name])   Hyperglycemia due to type 2 diabetes mellitus ([name])   Hyperbilirubinemia   Lactic acidosis   Alcoholic liver disease ([name])  [name] 45 y.o. female admitted with a fall, pneumonia, hypovolemia, I am following for abnormal Lft's likely combination of alcoholic hepatitis and ischemic hepatitis . She also has hepatic encephelopathy , cocaine in urine. Likely has chronic alcoholic liver disease/cirrhosis. Doppler USG shows no obvious thrombosis  Plan: TSH elevated ? Sick euthyroid vs true hyothyroidism Hepatic encephlopathy : continue lactulose , xifaxan, correct volume status eletcrolytes ( Low Cl,Sodium) Dietary input as she is likely malnourished with low albumin Follow up labs for acute viral hepatitis Abstain in the future from all alcohol.   If LFT's do not come down by tomorrow recommend MRCP .   LOS: 2 days   [name], MD 12/20/2020, 1:26 PM

## 2020-12-20 NOTE — Progress Notes (Addendum)
[facility] for Electrolyte Monitoring and Replacement   Recent Labs: Potassium (mmol/L)  Date Value  12/20/2020 3.9   Magnesium (mg/dL)  Date Value  12/20/2020 1.9   Calcium (mg/dL)  Date Value  12/20/2020 7.2 (L)   Albumin (g/dL)  Date Value  12/20/2020 <1.5 (L)   Phosphorus (mg/dL)  Date Value  12/20/2020 UNABLE TO REPORT DUE TO ICTERUS   Sodium (mmol/L)  Date Value  12/20/2020 129 (L)     Assessment: 45yo F with PMH Bipolar disorder, diabetes, alcohol use disorder and polysubstance abuse, FSH muscular dystrophy, with history of falls who was admitted to the hospital after she fell at home 2 days prior and was unable to get up. Pt was noted to be jaundiced on admin with AST 283, alk phos 495, total bilirubin 11.9, and ammonia 52.   Current electrolyte abnormalities likely due to refeeding syndrome/poor nutritional status given alcohol abuse  10/4 K 3.9>5.2 (hemolysis present) 10/4 Na 129>129>133 10/4 Ca 7.2>7.3 10/4 Phos unable to report due to icterus   Given elevation in K, the NS w/ 39mq/L fluids was transitioned to NS @ 1070mhr  Goal of Therapy:  Electrolytes WNL  Plan:  -Will continue NS _0 /hr -No additional replacement indicated at this time -Given presence of hemolysis, will not treat hyperkalemia  -Will re-check BMP, Mg and phos with AM labs, continue to monitor electrolytes and replace as indicated   BrNarda RutherfordPharmD Pharmacy Resident  12/20/2020 3:50 PM

## 2020-12-20 NOTE — Progress Notes (Signed)
Initial Nutrition Assessment  DOCUMENTATION CODES:   Morbid obesity  INTERVENTION:   Ensure Enlive po BID, each supplement provides 350 kcal and 20 grams of protein  MVI po daily   Liberalize diet   Pt at high refeed risk; recommend monitor potassium, magnesium and phosphorus labs daily until stable  NUTRITION DIAGNOSIS:   Inadequate oral intake related to acute illness as evidenced by per patient/family report.  GOAL:   Patient will meet greater than or equal to 90% of their needs  MONITOR:   PO intake, Supplement acceptance, Labs, Weight trends, Skin, I & O's  REASON FOR ASSESSMENT:   Consult Assessment of nutrition requirement/status  ASSESSMENT:   45 y.o. 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  Met with pt in room today. Pt reports poor appetite and oral intake for several weeks pta. Pt reports that her appetite remains poor in hospital. Pt reports eating some yogurt for lunch today but did not touch the rest of her tray. Pt reports that she has been drinking some Ensure at home. RD discussed with pt the importance of adequate nutrition needed to preserve lean muscle. Pt reports that she is willing to drink chocolate or vanilla Ensure in hospital. Pt is likely at refeed risk. There is no weight history documented in chart to determine if any significant recent weight changes. Pt does not believe that she has lost any weight.   Medications reviewed and include: lovenox, folic acid, insulin, MVI, protonix, thiamine, NaCl $RemoveBefore'@100ml'TnFtSBwrEvmoC$ /hr, vancomycin   Labs reviewed: Na 133(L), K 5.2(H), BUN <5(L), creat <0.30(L), Mg 1.7 wnl Wbc- 18.1(H), Hgb 9.0(L), Hct 29.4(L) Cbgs- 209, 162, 131 x 24hrs AIC 11.1(H)- 10/2  NUTRITION - FOCUSED  PHYSICAL EXAM:  Flowsheet Row Most Recent Value  Orbital Region No depletion  Upper Arm Region No depletion  Thoracic and Lumbar Region No depletion  Buccal Region No depletion  Temple Region No depletion  Clavicle Bone Region No depletion  Clavicle and Acromion Bone Region No depletion  Scapular Bone Region No depletion  Dorsal Hand No depletion  Patellar Region No depletion  Anterior Thigh Region No depletion  Posterior Calf Region No depletion  Edema (RD Assessment) Mild  Hair Reviewed  Eyes Reviewed  Mouth Reviewed  Skin Reviewed  [icteric]  Nails Reviewed   Diet Order:   Diet Order             Diet heart healthy/carb modified Room service appropriate? Yes; Fluid consistency: Thin  Diet effective now                  EDUCATION NEEDS:   Education needs have been addressed  Skin:  Skin Assessment: Reviewed RN Assessment  Last BM:  10/4- type 6  Height:   Ht Readings from Last 1 Encounters:  12/17/20 $RemoveB'5\' 7"'COOeJUrE$  (1.702 m)    Weight:   Wt Readings from Last 1 Encounters:  12/18/20 105.8 kg    Ideal Body Weight:  61.36 kg  BMI:  Body mass index is 36.54 kg/m.  Estimated Nutritional Needs:   Kcal:  2100-2400kcal/day  Protein:  105-120g/day  Fluid:  1.9-2.2L/day  Koleen Distance MS, RD, LDN Please refer to [facility] for RD and/or RD on-call/weekend/after hours pager

## 2020-12-20 NOTE — Progress Notes (Signed)
  Progress Note    [name]   XBM:[id number]  DOB: [birth year]  DOA: 12/17/2020     2 Date of Service: 12/20/2020   [name] a 45 y.o.femalewith medical history significant forBipolar disorder, diabetes, alcohol use disorder and polysubstance abuse, FSH muscular dystrophy, with history of falls, last seen by neurology in January 2020,who wasbrought in by EMS aftershe fell at home 2 days prior and was unable to get up.  While in ED, patient was found to have severe hyponatremia of 113, potassium 2.9. She also has an ammonia of 52, bilirubin of 11.9. She was given potassium, she was also placed on normal saline. Her chest x-ray also showed a right lower lobe infiltrates, probable aspiration pneumonia.  She is started on antibiotics with Unasyn.  Subjective:  No change in Jaundice.  She is still somnolent intermittently  Hospital Problems * Sepsis ([name]) Due to aspiration pneumonia, on IV vanco. MRSA screen +  Acute hepatic encephalopathy Continue lactulose, Xifaxan.  Dietary consult  Hyperbilirubinemia GI following.  Pending hepatitis panel, likely due to ischemic hepatitis.  Normal doppler liver.  We will get MRCP for further evaluation  Hepatic steatosis Needs to stop alcohol.  Pending hepatitis panel.  Alcohol use disorder, moderate, dependence ([name]) Patient is hoping to quit drinking.  Acute hyponatremia Slowly improving with hydration. nephro on board. 113->129. Encouraged PO intake.  Follow-up done held due to liver failure.  Initially hypertonic saline used due to severe hyponatremia of 113.  No longer on hypertonic saline now  Hypokalemia Repleted and resolved  Alcohol withdrawal ([name]) CIWA protocol  Euthyroid sick syndrome Elevated TSH, normal free T4   Objective Vital signs were reviewed and unremarkable.  Vitals:   12/20/20 1133 12/20/20 1530 12/20/20 2011 12/20/20 2200  BP: 94/66 98/65 97/64  97/64  Pulse: (!) 105 (!) 109  (!) 102  Resp: 18  20 20    Temp: 97.9 F (36.6 C) 97.9 F (36.6 C) 97.8 F (36.6 C)   TempSrc: Oral Oral Oral   SpO2: 90% 92% 97%   Weight:      Height:       105.8 kg  Exam Physical Exam   General exam:Appears calm and comfortable.  She looks yellowish due to jaundice Respiratory system: Clear to auscultation. Respiratory effort normal. Cardiovascular system:S1 &S2 heard, RRR. No JVD, murmurs, rubs, gallops or clicks. No pedal edema. Gastrointestinal system:Abdomen is nondistended, soft and nontender. No organomegaly or masses felt. Normal bowel sounds heard. Central nervous system:Drowsy. No focal neurological deficits. Extremities: Symmetric 5 x 5 power. Skin: Jaundiced  Labs / Other Information My review of labs, imaging, notes and other tests is significant for Electrolyte abnormalities and hyperbilirubinemia    Cousin updated at bedside  Time spent: 35 minutes [facility] 12/20/2020, 10:38 PM

## 2020-12-20 NOTE — Progress Notes (Addendum)
Pt is scheduled for MRI at 2300 but reported claustrophobic. MD Para March made aware. Will continue to monitor.  Update 2208: MD Para March states will place order. Will continue to monitor.  Update 0049: Pt received ativan 0.5 mg prior to the test. Pt was educated about the importance of the test but still refused to proceed even encouraging to messaging the doctor for additional dose of ativan. MD Para March made aware. Will continue to monitor.  Update 0125: Talked to MD Para March but no new order was place. Will continue to monitor.

## 2020-12-20 NOTE — Assessment & Plan Note (Signed)
Due to aspiration pneumonia, on IV vanco. MRSA screen +

## 2020-12-20 NOTE — Progress Notes (Signed)
[facility]  ROUNDING NOTE   Subjective:   [name] is a 45 year old Caucasian female with a past medical history of bipolar disorder, diabetes mellitus, alcohol abuse, polysubstance abuse, muscular dystrophy who was brought to the ER with chief complaint of fall around 2 to 3 days back.  Patient seen laying in bed States she feels a little better Continues to have poor appetite Ask if she is dying    Objective:  Vital signs in last 24 hours:  Temp:  [97.9 F (36.6 C)-98.7 F (37.1 C)] 97.9 F (36.6 C) (10/04 1133) Pulse Rate:  [101-110] 105 (10/04 1133) Resp:  [18-20] 18 (10/04 1133) BP: (86-121)/(57-73) 94/66 (10/04 1133) SpO2:  [90 %-99 %] 90 % (10/04 1133)  Weight change:  Filed Weights   12/17/20 2255 12/18/20 1746  Weight: 90.7 kg 105.8 kg    Intake/Output: I/O last 3 completed shifts: In: 4876.5 [P.O.:480; I.V.:2828.1; IV Piggyback:1568.4] Out: 1200 [Urine:1200]   Intake/Output this shift:  Total I/O In: 585.5 [I.V.:435.5; IV Piggyback:150] Out: -   Physical Exam: General: NAD, laying in bed  Head: Normocephalic, atraumatic. Dry oral mucosal membranes  Eyes: Jaundiced sclera  Lungs:  Clear to auscultation, normal effort, [place] O2  Heart: Regular rate and rhythm  Abdomen:  Soft, nontender,   Extremities:  1+ BLE peripheral edema. 2+ BUE  Neurologic: Alert moving all four extremities  Skin: Jaundiced, generalized bruising       Basic Metabolic Panel: Recent Labs  Lab 12/19/20 0541 12/19/20 1043 12/19/20 1547 12/19/20 2233 12/20/20 0534 12/20/20 1035  NA 122* 123* 126*  126* 129* 129* 129*  K 2.7* 2.6* 2.8* 3.1* 3.9  --   CL 73* 76* 77* 81* 82*  --   CO2 38* 37* 40* 39* 38*  --   GLUCOSE 181* 170* 155* 176* 220*  --   BUN <5* <5* <5* <5* <5*  --   CREATININE <0.30* <0.30* <0.30* <0.30* <0.30*  --   CALCIUM 6.9* 6.9* 7.0* 7.1* 7.2*  --   MG 1.6*  --  2.0  --  1.9  --   PHOS 1.0*  --  1.2*  --  UNABLE TO REPORT DUE TO ICTERUS   --      Liver Function Tests: Recent Labs  Lab 12/17/20 2335 12/18/20 1205 12/19/20 0541 12/20/20 0534  AST 283* 228* 209* 212*  ALT 39 34 31 31  ALKPHOS 495* 362* 338* 339*  BILITOT 11.9* 11.7* 12.9* 15.2*  PROT 6.7 6.1* 5.9* 6.0*  ALBUMIN 1.8* <1.5* <1.5* <1.5*    Recent Labs  Lab 12/19/20 1547  LIPASE 29   Recent Labs  Lab 12/17/20 2335 12/19/20 0541  AMMONIA 52* 81*     CBC: Recent Labs  Lab 12/17/20 2335 12/19/20 0541 12/20/20 0534  WBC 11.2* 16.9* 18.1*  NEUTROABS  --  14.0*  --   HGB 13.4 9.0* 9.0*  HCT 39.6 26.7* 29.4*  MCV 93.8 99.6 103.5*  PLT 132* 123* 94*     Cardiac Enzymes: Recent Labs  Lab 12/17/20 2335 12/19/20 1547  CKTOTAL 153 56     BNP: Invalid input(s): POCBNP  CBG: Recent Labs  Lab 12/18/20 1625 12/19/20 0849 12/19/20 2026 12/20/20 0747 12/20/20 1136  GLUCAP 239* 206* 190* 209* 162*     Microbiology: Results for orders placed or performed during the hospital encounter of 12/17/20  Resp Panel by RT-PCR (Flu A&B, Covid) Nasopharyngeal Swab     Status: None   Collection Time: 12/18/20  1:16 AM   Specimen: Nasopharyngeal Swab; Nasopharyngeal(NP) swabs in vial transport medium  Result Value Ref Range Status   SARS Coronavirus 2 by RT PCR NEGATIVE NEGATIVE Final    Comment: (NOTE) SARS-CoV-2 target nucleic acids are NOT DETECTED.  The SARS-CoV-2 RNA is generally detectable in upper respiratory specimens during the acute phase of infection. The lowest concentration of SARS-CoV-2 viral copies this assay can detect is 138 copies/mL. A negative result does not preclude SARS-Cov-2 infection and should not be used as the sole basis for treatment or other patient management decisions. A negative result may occur with  improper specimen collection/handling, submission of specimen other than nasopharyngeal swab, presence of viral mutation(s) within the areas targeted by this assay, and inadequate number of  viral copies(<138 copies/mL). A negative result must be combined with clinical observations, patient history, and epidemiological information. The expected result is Negative.  Fact Sheet for Patients:  BloggerCourse.com  Fact Sheet for Healthcare Providers:  SeriousBroker.it  This test is no t yet approved or cleared by the [place] FDA and  has been authorized for detection and/or diagnosis of SARS-CoV-2 by FDA under an Emergency Use Authorization (EUA). This EUA will remain  in effect (meaning this test can be used) for the duration of the COVID-19 declaration under Section 564(b)(1) of the Act, 21 U.S.C.section 360bbb-3(b)(1), unless the authorization is terminated  or revoked sooner.       Influenza A by PCR NEGATIVE NEGATIVE Final   Influenza B by PCR NEGATIVE NEGATIVE Final    Comment: (NOTE) The Xpert Xpress SARS-CoV-2/FLU/RSV plus assay is intended as an aid in the diagnosis of influenza from Nasopharyngeal swab specimens and should not be used as a sole basis for treatment. Nasal washings and aspirates are unacceptable for Xpert Xpress SARS-CoV-2/FLU/RSV testing.  Fact Sheet for Patients: BloggerCourse.com  Fact Sheet for Healthcare Providers: SeriousBroker.it  This test is not yet approved or cleared by the [place] FDA and has been authorized for detection and/or diagnosis of SARS-CoV-2 by FDA under an Emergency Use Authorization (EUA). This EUA will remain in effect (meaning this test can be used) for the duration of the COVID-19 declaration under Section 564(b)(1) of the Act, 21 U.S.C. section 360bbb-3(b)(1), unless the authorization is terminated or revoked.  Performed at [facility], [address] [place], [place] [address]   Blood culture (routine x 2)     Status: None (Preliminary result)   Collection Time: 12/18/20  1:16 AM    Specimen: BLOOD  Result Value Ref Range Status   Specimen Description BLOOD RIGHT HAND  Final   Special Requests   Final    BOTTLES DRAWN AEROBIC AND ANAEROBIC Blood Culture results may not be optimal due to an inadequate volume of blood received in culture bottles   Culture   Final    NO GROWTH 2 DAYS Performed at [facility], [address], [place], [place] [address]    Report Status PENDING  Incomplete  Blood culture (routine x 2)     Status: None (Preliminary result)   Collection Time: 12/18/20  1:16 AM   Specimen: BLOOD  Result Value Ref Range Status   Specimen Description BLOOD RIGHT ASSIST CONTROL  Final   Special Requests   Final    BOTTLES DRAWN AEROBIC AND ANAEROBIC Blood Culture adequate volume   Culture   Final    NO GROWTH 2 DAYS Performed at [facility], [address] [place], [place] [address]  Report Status PENDING  Incomplete  MRSA Next Gen by PCR, Nasal     Status: Abnormal   Collection Time: 12/19/20  4:04 PM   Specimen: Nasal Mucosa; Nasal Swab  Result Value Ref Range Status   MRSA by PCR Next Gen DETECTED (A) NOT DETECTED Final    Comment: RESULT CALLED TO, READ BACK BY AND VERIFIED WITH: AMY DOLTON @1830  ON 12/19/20 SKL (NOTE) The GeneXpert MRSA Assay (FDA approved for NASAL specimens only), is one component of a comprehensive MRSA colonization surveillance program. It is not intended to diagnose MRSA infection nor to guide or monitor treatment for MRSA infections. Test performance is not FDA approved in patients less than 11 years old. Performed at [facility], [address] [place], [place]     Coagulation Studies: Recent Labs    12/19/20 0541  LABPROT 15.6*  INR 1.2     Urinalysis: Recent Labs    12/19/20 1110  COLORURINE AMBER*  LABSPEC 1.008  PHURINE 7.0  GLUCOSEU NEGATIVE  HGBUR MODERATE*  BILIRUBINUR MODERATE*  KETONESUR NEGATIVE  PROTEINUR NEGATIVE  NITRITE NEGATIVE   LEUKOCYTESUR NEGATIVE       Imaging: 02/18/21 LIVER DOPPLER  Result Date: 12/20/2020 CLINICAL DATA:  Abnormal liver function tests. EXAM: DUPLEX ULTRASOUND OF LIVER TECHNIQUE: Color and duplex Doppler ultrasound was performed to evaluate the hepatic in-flow and out-flow vessels. COMPARISON:  December 18, 2020. FINDINGS: Liver: Increased parenchymal echogenicity. Normal hepatic contour without nodularity. No focal lesion, mass or intrahepatic biliary ductal dilatation. Main Portal Vein size: 0.9 cm Portal Vein Velocities Main Prox:  29.4 cm/sec Main Mid: 24.8 cm/sec Main Dist:  22.2 cm/sec Right: 20.6 cm/sec Left: 19.0 cm/sec Normal hepatopetal flow is noted in the portal veins. Hepatic Vein Velocities Right:  Not visualized. Middle:  Not visualized. Left:  Not visualized. IVC: Present and patent with normal respiratory phasicity. Hepatic Artery Velocity:  48.1 cm/sec Splenic Vein Velocity:  10.1 cm/sec Spleen: 9.0 cm x 5.0 cm x 11.2 cm with a total volume of 266 cm^3 (411 cm^3 is upper limit normal) Portal Vein Occlusion/Thrombus: No Splenic Vein Occlusion/Thrombus: No Ascites: Present. Varices: None Limited exam due to body habitus and bowel gas. IMPRESSION: Limited exam due to body habitus and overlying bowel gas. There is no definite evidence of portal or splenic venous thrombosis. Due to above described limitations, hepatic veins are not visualized. Minimal to mild ascites is noted. Electronically Signed   By: December 20, 2020 M.D.   On: 12/20/2020 12:10     Medications:    sodium chloride Stopped (12/18/20 2305)   0.9 % NaCl with KCl 40 mEq / L 100 mL/hr at 12/20/20 0644   thiamine injection 500 mg (12/20/20 0556)   vancomycin      enoxaparin (LOVENOX) injection  0.5 mg/kg Subcutaneous Q24H   folic acid  1 mg Oral Daily   insulin aspart  0-20 Units Subcutaneous TID WC   insulin aspart  0-5 Units Subcutaneous QHS   insulin glargine-yfgn  8 Units Subcutaneous QHS   LORazepam  0-4 mg Intravenous  Q12H   Or   LORazepam  0-4 mg Oral Q12H   multivitamin with minerals  1 tablet Oral Daily   pantoprazole (PROTONIX) IV  40 mg Intravenous Q12H   rifaximin  550 mg Oral BID   [START ON 12/23/2020] thiamine  100 mg Oral Daily   sodium chloride, albuterol, lactulose, methocarbamol, ondansetron **OR** ondansetron (ZOFRAN) IV  Assessment/ Plan:  [name] is  a 45 y.o.  female with a past medical history of bipolar disorder, diabetes mellitus, alcohol abuse, polysubstance abuse, muscular dystrophy who was brought to the ER with chief complaint of fall around 2 to 3 days back.    Severe hyponatremia believed due to hypovolemia due to patient statements of not eating of 2 days while laying on floor. Also believed to have low osmolar intake resulting in BUN <5. Goal correction of 8 mEq /24 hrs. Sodium level of 113 on admission. Tolvaptan held due to liver failure. Hypertonic solution held due to improvement with patient presentation. Sodium increased to 129. IVF discontinued and patient encouraged to increase oral intake  Sepsis likely due to aspiration pneumonia. Chest X-ray show right lower lobe infiltrates. Receiving antibiotics  Alcoholic liver disease with elevated total bilirubin and ammonia level. CIWA protocol in place. Discussed with patient that her prognosis is not favorable at this time.Patient states she would like to stop drinking. Encouraged patient to stop alcohol use and refer to support groups in the area.   Hypokalemia Currently level 3.9. recovered    LOS: 2   10/4/20222:28 PM

## 2020-12-20 NOTE — Assessment & Plan Note (Signed)
Patient is hoping to quit drinking.

## 2020-12-20 NOTE — Progress Notes (Signed)
OT Cancellation Note  Patient Details Name: [name] MRN: [id number] DOB: 19-Nov-1975   Cancelled Treatment:    Reason Eval/Treat Not Completed: Medical issues which prohibited therapy. Order received, pt chart reviewed. Per consult with PT, pt remains lethargic. Low BP and elevated HR were noted in chart review - RN states pt is very sick. PT will hold today and re-assess at later date.   Rollene Rotunda, OTS  12/20/2020, 1:13 PM

## 2020-12-20 NOTE — Progress Notes (Signed)
PT Cancellation Note  Patient Details Name: [name] MRN: [id number] DOB: 1976/02/12   Cancelled Treatment:    Reason Eval/Treat Not Completed: Medical issues which prohibited therapy;Fatigue/lethargy limiting ability to participate. Order received, pt chart reviewed. Spoke to RN who reports pt remains lethargic. Low BP and elevated HR were noted in chart review - RN states pt is very sick. PT will hold today and re-assess at later date.    Basilia Jumbo PT, DPT 12/20/20 9:12 AM [phone]

## 2020-12-20 NOTE — Assessment & Plan Note (Signed)
-   CIWA protocol 

## 2020-12-20 NOTE — Assessment & Plan Note (Signed)
GI following.  Pending hepatitis panel, likely due to ischemic hepatitis.  Normal doppler liver.  We will get MRCP for further evaluation

## 2020-12-20 NOTE — Progress Notes (Addendum)
Inpatient Diabetes Program Recommendations  AACE/ADA: New Consensus Statement on Inpatient Glycemic Control   Target Ranges:  Prepandial:   less than 140 mg/dL      Peak postprandial:   less than 180 mg/dL (1-2 hours)      Critically ill patients:  140 - 180 mg/dL   Results for [name], [name] (MRN [id number]) as of 12/20/2020 11:15  Ref. Range 12/19/2020 08:49 12/19/2020 20:26 12/20/2020 07:47  Glucose-Capillary Latest Ref Range: 70 - 99 mg/dL 206 (H) 190 (H) 209 (H)    Admit with: Fall/ Aspiration Pneumonia/ Sepsis/ Hyponatremia/ Alcohol liver disease with hepatic steatosis and ascites Severe hyperbilirubinemia/jaundice/ Elevated ammonia level   History: DM2, Bipolar disorder, Polysubstance Abuse Home DM Meds: Janumet XR 100/1000 mg Daily                             Metformin 500 mg BID                             Ozempic Qweek Current Orders: Novolog 0-20 units TID with meals, Novolog 0-5 units QHS     Recommendation:   Insulin: Please consider ordering Semglee 8 units Q24H (starting now).  Outpatient DM needs:  Patient reported she has not been taking her Ozempic for 2 months due to being evicted from her home and having to leave it behind.  Does not have glucometer at home either.  Please provide Rxs for Ozempic and glucose monitoring kit (#[id number]) at time of d/c.  Thanks, Barnie Alderman, RN, MSN, CDE Diabetes Coordinator Inpatient Diabetes Program [phone] (Team Pager from 8am to 5pm)

## 2020-12-20 NOTE — Assessment & Plan Note (Signed)
Needs to stop alcohol.  Pending hepatitis panel.

## 2020-12-21 ENCOUNTER — Inpatient Hospital Stay: Payer: Medicare Other

## 2020-12-21 DIAGNOSIS — K7682 Hepatic encephalopathy: Secondary | ICD-10-CM

## 2020-12-21 DIAGNOSIS — F3181 Bipolar II disorder: Secondary | ICD-10-CM

## 2020-12-21 DIAGNOSIS — E876 Hypokalemia: Secondary | ICD-10-CM

## 2020-12-21 DIAGNOSIS — E871 Hypo-osmolality and hyponatremia: Secondary | ICD-10-CM | POA: Diagnosis not present

## 2020-12-21 DIAGNOSIS — R6520 Severe sepsis without septic shock: Secondary | ICD-10-CM

## 2020-12-21 DIAGNOSIS — F10930 Alcohol use, unspecified with withdrawal, uncomplicated: Secondary | ICD-10-CM

## 2020-12-21 DIAGNOSIS — E1165 Type 2 diabetes mellitus with hyperglycemia: Secondary | ICD-10-CM

## 2020-12-21 DIAGNOSIS — K760 Fatty (change of) liver, not elsewhere classified: Secondary | ICD-10-CM

## 2020-12-21 DIAGNOSIS — G7102 Facioscapulohumeral muscular dystrophy: Secondary | ICD-10-CM

## 2020-12-21 DIAGNOSIS — F1020 Alcohol dependence, uncomplicated: Secondary | ICD-10-CM | POA: Diagnosis not present

## 2020-12-21 DIAGNOSIS — A419 Sepsis, unspecified organism: Secondary | ICD-10-CM | POA: Diagnosis not present

## 2020-12-21 DIAGNOSIS — K709 Alcoholic liver disease, unspecified: Secondary | ICD-10-CM

## 2020-12-21 DIAGNOSIS — E872 Acidosis, unspecified: Secondary | ICD-10-CM

## 2020-12-21 DIAGNOSIS — F1910 Other psychoactive substance abuse, uncomplicated: Secondary | ICD-10-CM

## 2020-12-21 DIAGNOSIS — K7200 Acute and subacute hepatic failure without coma: Secondary | ICD-10-CM

## 2020-12-21 LAB — CBC
HCT: 28.3 % — ABNORMAL LOW (ref 36.0–46.0)
Hemoglobin: 8.7 g/dL — ABNORMAL LOW (ref 12.0–15.0)
MCH: 32.8 pg (ref 26.0–34.0)
MCHC: 30.7 g/dL (ref 30.0–36.0)
MCV: 106.8 fL — ABNORMAL HIGH (ref 80.0–100.0)
Platelets: 97 10*3/uL — ABNORMAL LOW (ref 150–400)
RBC: 2.65 MIL/uL — ABNORMAL LOW (ref 3.87–5.11)
RDW: 23.6 % — ABNORMAL HIGH (ref 11.5–15.5)
WBC: 17.4 10*3/uL — ABNORMAL HIGH (ref 4.0–10.5)
nRBC: 1.9 % — ABNORMAL HIGH (ref 0.0–0.2)

## 2020-12-21 LAB — CMV DNA, QUANTITATIVE, PCR
CMV DNA Quant: NEGATIVE IU/mL
Log10 CMV Qn DNA Pl: UNDETERMINED log10 IU/mL

## 2020-12-21 LAB — COMPREHENSIVE METABOLIC PANEL
ALT: 27 U/L (ref 0–44)
AST: 178 U/L — ABNORMAL HIGH (ref 15–41)
Albumin: <1.5 g/dL — ABNORMAL LOW (ref 3.5–5.0)
Alkaline Phosphatase: 305 U/L — ABNORMAL HIGH (ref 38–126)
Anion gap: 11 (ref 5–15)
BUN: <5 mg/dL — ABNORMAL LOW (ref 6–20)
CO2: 34 mmol/L — ABNORMAL HIGH (ref 22–32)
Calcium: 7.3 mg/dL — ABNORMAL LOW (ref 8.9–10.3)
Chloride: 88 mmol/L — ABNORMAL LOW (ref 98–111)
Creatinine, Ser: <0.3 mg/dL — ABNORMAL LOW (ref 0.44–1.00)
Glucose, Bld: 141 mg/dL — ABNORMAL HIGH (ref 70–99)
Potassium: 3.5 mmol/L (ref 3.5–5.1)
Sodium: 133 mmol/L — ABNORMAL LOW (ref 135–145)
Total Bilirubin: 14.8 mg/dL — ABNORMAL HIGH (ref 0.3–1.2)
Total Protein: 5.7 g/dL — ABNORMAL LOW (ref 6.5–8.1)

## 2020-12-21 LAB — SODIUM
Sodium: 130 mmol/L — ABNORMAL LOW (ref 135–145)
Sodium: 130 mmol/L — ABNORMAL LOW (ref 135–145)
Sodium: 133 mmol/L — ABNORMAL LOW (ref 135–145)

## 2020-12-21 LAB — HEPATITIS B E ANTIGEN: Hep B E Ag: POSITIVE — AB

## 2020-12-21 LAB — HEPATITIS C ANTIBODY: HCV Ab: <0.1 s/co ratio — AB (ref 0.0–0.9)

## 2020-12-21 LAB — MAGNESIUM: Magnesium: 1.7 mg/dL (ref 1.7–2.4)

## 2020-12-21 LAB — GLUCOSE, CAPILLARY
Glucose-Capillary: 148 mg/dL — ABNORMAL HIGH (ref 70–99)
Glucose-Capillary: 136 mg/dL — ABNORMAL HIGH (ref 70–99)
Glucose-Capillary: 172 mg/dL — ABNORMAL HIGH (ref 70–99)

## 2020-12-21 LAB — EPSTEIN BARR VRS(EBV DNA BY PCR): EBV DNA QN by PCR: NEGATIVE IU/mL

## 2020-12-21 LAB — T3, FREE: T3, Free: 1.7 pg/mL — ABNORMAL LOW (ref 2.0–4.4)

## 2020-12-21 LAB — PHOSPHORUS: Phosphorus: UNDETERMINED mg/dL (ref 2.5–4.6)

## 2020-12-21 MED ORDER — POTASSIUM CHLORIDE IN NACL 20-0.9 MEQ/L-% IV SOLN
INTRAVENOUS | Status: DC
  Filled 2020-12-21 (×3): qty 1000

## 2020-12-21 MED ORDER — PREGABALIN 75 MG PO CAPS
75.0000 mg | ORAL_CAPSULE | Freq: Three times a day (TID) | ORAL | Status: DC
  Administered 2020-12-21 – 2020-12-22 (×5): 150 mg via ORAL
  Administered 2020-12-22 – 2020-12-23 (×4): 75 mg via ORAL
  Administered 2020-12-24: 150 mg via ORAL
  Administered 2020-12-24: 75 mg via ORAL
  Administered 2020-12-24: 150 mg via ORAL
  Administered 2020-12-25 (×3): 75 mg via ORAL
  Administered 2020-12-26: 150 mg via ORAL
  Filled 2020-12-21: qty 1
  Filled 2020-12-21: qty 2
  Filled 2020-12-21: qty 1
  Filled 2020-12-21 (×4): qty 2
  Filled 2020-12-21: qty 1
  Filled 2020-12-21: qty 2
  Filled 2020-12-21 (×2): qty 1
  Filled 2020-12-21 (×2): qty 2
  Filled 2020-12-21 (×3): qty 1

## 2020-12-21 MED ORDER — FLUTICASONE PROPIONATE 50 MCG/ACT NA SUSP
1.0000 | Freq: Every day | NASAL | Status: DC
  Administered 2020-12-21 – 2021-01-25 (×36): 1 via NASAL
  Filled 2020-12-21: qty 16

## 2020-12-21 MED ORDER — FLUOXETINE HCL 20 MG PO CAPS
80.0000 mg | ORAL_CAPSULE | Freq: Every day | ORAL | Status: DC
  Administered 2020-12-21 – 2021-01-26 (×37): 80 mg via ORAL
  Filled 2020-12-21 (×38): qty 4

## 2020-12-21 MED ORDER — TRAMADOL HCL 50 MG PO TABS: 50.0000 mg | ORAL_TABLET | Freq: Four times a day (QID) | ORAL | Status: DC | PRN

## 2020-12-21 MED ORDER — CLONAZEPAM 1 MG PO TABS
1.0000 mg | ORAL_TABLET | Freq: Two times a day (BID) | ORAL | Status: DC
  Administered 2020-12-21 – 2020-12-22 (×3): 1 mg via ORAL
  Filled 2020-12-21 (×3): qty 1

## 2020-12-21 MED ORDER — CARIPRAZINE HCL 3 MG PO CAPS
3.0000 mg | ORAL_CAPSULE | Freq: Every day | ORAL | Status: DC
  Administered 2020-12-21: 3 mg via ORAL
  Filled 2020-12-21 (×2): qty 1

## 2020-12-21 MED ORDER — MUPIROCIN 2 % EX OINT
1.0000 "application " | TOPICAL_OINTMENT | Freq: Two times a day (BID) | CUTANEOUS | Status: AC
  Administered 2020-12-21 – 2020-12-25 (×10): 1 via NASAL
  Filled 2020-12-21: qty 22

## 2020-12-21 MED ORDER — ALBUTEROL SULFATE HFA 108 (90 BASE) MCG/ACT IN AERS
2.0000 | INHALATION_SPRAY | Freq: Four times a day (QID) | RESPIRATORY_TRACT | Status: AC | PRN
  Filled 2020-12-21: qty 6.7

## 2020-12-21 MED ORDER — CHLORHEXIDINE GLUCONATE CLOTH 2 % EX PADS
6.0000 | MEDICATED_PAD | Freq: Every day | CUTANEOUS | Status: AC
  Administered 2020-12-22 – 2020-12-26 (×5): 6 via TOPICAL

## 2020-12-21 MED ORDER — BUDESONIDE 3 MG PO CPEP
9.0000 mg | ORAL_CAPSULE | Freq: Every day | ORAL | Status: DC
  Administered 2020-12-21 – 2021-01-26 (×37): 9 mg via ORAL
  Filled 2020-12-21 (×17): qty 3
  Filled 2020-12-21: qty 2
  Filled 2020-12-21 (×20): qty 3

## 2020-12-21 NOTE — Consult Note (Signed)
Pharmacy Antibiotic Note  [name] is a 45 y.o. female with PMH of bipolar disorder, diabetes, alcohol use disorder and polysubstance abuse, FSH muscular dystrophy, with history of falls who was admitted on 12/17/2020 with  aspiration pneumonia  after falling and being down for 2 days and unable to get up. Pt was found to be jaundiced and with AST 283, alk phos 495, total bilirubin 11.9, and ammonia 52. Pharmacy has been consulted for vancomycin dosing.  Pt has remained afeb, w/ Bcx NG x3 days  WBC have decreased 18.1>17.4 with the transition from Unasyn to vancomycin   Plan: Vancomycin  -Will continue vancomycin 1000mg  IV q12h    -Est AUC 492.1    -Css min 13.5    -Scr used: 0.8    -IBW used and Vd of 0.5 -Will obtain labs around the 4th or 5th dose if vancomycin is continued   -Will continue to monitor renal function and adjust dose as clinically indicated   Height: 5\' 7"  (170.2 cm) Weight: 105.8 kg (233 lb 4.8 oz) IBW/kg (Calculated) : 61.6  Temp (24hrs), Avg:98.3 F (36.8 C), Min:97.8 F (36.6 C), Max:99 F (37.2 C)  Recent Labs  Lab 12/17/20 2335 12/18/20 0116 12/18/20 0621 12/18/20 1205 12/19/20 0541 12/19/20 1043 12/19/20 1547 12/19/20 2233 12/20/20 0534 12/20/20 1406  WBC 11.2*  --   --   --  16.9*  --   --   --  18.1*  --   CREATININE <0.30*  --  0.37*   < > <0.30* <0.30* <0.30* <0.30* <0.30* <0.30*  LATICACIDVEN  --  2.5* 1.7  --   --   --   --   --   --   --    < > = values in this interval not displayed.     CrCl cannot be calculated (This lab value cannot be used to calculate CrCl because it is not a number: <0.30).    Allergies  Allergen Reactions   Erythromycin Anaphylaxis   Sulfa Antibiotics Anaphylaxis   Tylenol [Acetaminophen] Other (See Comments)    Pancreatitis and liver dysfunction, not supposed to take med   Nsaids Other (See Comments)    Causes GI problems, very bad reaction-per patient.    Gabapentin     Mood changes      Antimicrobials this admission: Ongoing Vancomycin 10/4 >>   Completed Unasyn 10/2 >> 10/4  Microbiology results: 10/2 BCx: NG x2 days 10/3 MRSA PCR: detected   Thank you for allowing pharmacy to be a part of this patient's care.  Narda Rutherford, PharmD Pharmacy Resident  12/21/2020 7:01 AM

## 2020-12-21 NOTE — Progress Notes (Signed)
PROGRESS NOTE  [name] UTM:[id number] DOB: 06-23-1975 DOA: 12/17/2020 PCP: Kellie Blase, PA-C  Brief History   [name] is a 45 y.o. female with medical history significant for Bipolar disorder, diabetes, alcohol use disorder and polysubstance abuse, FSH muscular dystrophy, with history of falls, last seen by neurology in January 2020, who was brought in by EMS after she fell at home 2 days prior and was unable to get up.   While in ED, patient was found to have severe hyponatremia of 113, potassium 2.9.   She also has an ammonia of 52, bilirubin of 11.9. She was given potassium, she was also placed on normal saline. Her chest x-ray also showed a right lower lobe infiltrates, probable aspiration pneumonia.  She is started on antibiotics with Unasyn.   The patient remains jaundiced. She appears to be in hepatic encephalopathy, although her parents at bedside stated that she was awake earlier in the day.  The patient has refused MRI abdomen/liver.  Consultants  Gastroenterology Nephrology  Procedures  None  Antibiotics   Anti-infectives (From admission, onward)    Start     Dose/Rate Route Frequency Ordered Stop   12/20/20 2200  vancomycin (VANCOCIN) IVPB 1000 mg/200 mL premix        1,000 mg 200 mL/hr over 60 Minutes Intravenous Every 12 hours 12/20/20 0839     12/20/20 0930  vancomycin (VANCOCIN) 2,000 mg in sodium chloride 0.9 % 500 mL IVPB  Status:  Discontinued        2,000 mg 250 mL/hr over 120 Minutes Intravenous  Once 12/20/20 0839 12/20/20 0907   12/20/20 0930  vancomycin (VANCOCIN) 2,000 mg in sodium chloride 0.9 % 500 mL IVPB        2,000 mg 250 mL/hr over 120 Minutes Intravenous  Once 12/20/20 0907 12/20/20 1223   12/19/20 1630  rifaximin (XIFAXAN) tablet 550 mg        550 mg Oral 2 times daily 12/19/20 1533     12/18/20 1200  Ampicillin-Sulbactam (UNASYN) 3 g in sodium chloride 0.9 % 100 mL IVPB  Status:  Discontinued        3 g 200 mL/hr over 30 Minutes  Intravenous Every 6 hours 12/18/20 1038 12/20/20 0839   12/18/20 0045  Ampicillin-Sulbactam (UNASYN) 3 g in sodium chloride 0.9 % 100 mL IVPB        3 g 200 mL/hr over 30 Minutes Intravenous  Once 12/18/20 0033 12/18/20 0303      Subjective  The patient is lethargic. She will awaken briefly to stimuli and then go back to sleep. Family is at bedside.  Objective   Vitals:  Vitals:   12/21/20 1223 12/21/20 1552  BP: 115/66 130/86  Pulse: (!) 108 (!) 110  Resp: 16 16  Temp: 98.2 F (36.8 C) 98.3 F (36.8 C)  SpO2: 97% 93%    Exam:  Constitutional:  The patient is lethargic. Awakens briefly to stimuli and then goes back to sleep.  Eyes:  pupils and irises appear normal Sclera are icteric Respiratory:  CTA bilaterally, no w/r/r.  Respiratory effort normal. No retractions or accessory muscle use Cardiovascular:  RRR, no m/r/g No LE extremity edema   Normal pedal pulses Abdomen:  Abdomen appears normal; no tenderness or masses No hernias No HSM Musculoskeletal:  Digits/nails BUE: no clubbing, cyanosis, petechiae, infection exam of joints, bones, muscles of at least one of following: head/neck, RUE, LUE, RLE, LLE   Skin:  No rashes, lesions, ulcers palpation  of skin: no induration or nodules jaundiced Neurologic:  Unable to evaluate due to the patient's inability to cooperate with exam. Psychiatric:  Unable to evaluate due to the patient's inability to cooperate with exam.   I have personally reviewed the following:   Today's Data  Vitals  Lab Data  BMP  Micro Data  Blood culture x 2 No growth  Imaging  CT head US abdomen  Cardiology Data  EKG  Scheduled Meds:  budesonide  9 mg Oral Daily   cariprazine  3 mg Oral Daily   [START ON 12/22/2020] Chlorhexidine Gluconate Cloth  6 each Topical Q0600   clonazePAM  1 mg Oral BID   enoxaparin (LOVENOX) injection  0.5 mg/kg Subcutaneous Q24H   feeding supplement  237 mL Oral BID BM   FLUoxetine  80 mg Oral  Daily   fluticasone  1 spray Each Nare Daily   folic acid  1 mg Oral Daily   insulin aspart  0-20 Units Subcutaneous TID WC   insulin aspart  0-5 Units Subcutaneous QHS   insulin glargine-yfgn  8 Units Subcutaneous QHS   LORazepam  0-4 mg Intravenous Q12H   Or   LORazepam  0-4 mg Oral Q12H   multivitamin with minerals  1 tablet Oral Daily   mupirocin ointment  1 application Nasal BID   pantoprazole (PROTONIX) IV  40 mg Intravenous Q12H   pregabalin  75-150 mg Oral TID   rifaximin  550 mg Oral BID   [START ON 12/23/2020] thiamine  100 mg Oral Daily   Continuous Infusions:  sodium chloride Stopped (12/18/20 2305)   0.9 % NaCl with KCl 20 mEq / L 50 mL/hr at 12/21/20 1229   thiamine injection 500 mg (12/21/20 1554)   vancomycin 1,000 mg (12/21/20 1231)    Principal Problem:   Sepsis ([name]) Active Problems:   Alcohol withdrawal ([name])   Bipolar 2 disorder ([name])   Hypokalemia   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[name])   Acute hyponatremia   Alcohol use disorder, moderate, dependence ([name])   Hepatic steatosis   Polysubstance abuse ([name])   Hyperglycemia due to type 2 diabetes mellitus ([name])   Hyperbilirubinemia   Lactic acidosis   Alcoholic liver disease ([name])   Acute hepatic encephalopathy   LOS: 3 days   A & P  Sepsis ([name]) Due to aspiration pneumonia, on IV vanco. MRSA screen +   Acute hepatic encephalopathy Continue lactulose, Xifaxan.  Dietary consult   Hyperbilirubinemia GI following.  Pending hepatitis panel, likely due to ischemic hepatitis.  Normal doppler liver.  We will get MRCP for further evaluation   Hepatic steatosis Needs to stop alcohol.  Pending hepatitis panel.   Alcohol use disorder, moderate, dependence ([name]) Patient is hoping to quit drinking.   Acute hyponatremia Slowly improving with hydration. nephro on board. 113->129. Encouraged PO intake.  Follow-up done held due to liver failure.  Initially hypertonic saline used due to severe  hyponatremia of 113.  No longer on hypertonic saline now   Hypokalemia Repleted and resolved   Alcohol withdrawal ([name]) CIWA protocol   Euthyroid sick syndrome Elevated TSH, normal free T4  I have seen and examined this patient myself. I have spent 34 minutes in her evaluation and care.  DVT prophylaxis: Lovenox Code Status: Full Code Family Communication: Discussed patient in detail with family at bedside. All questions answered to the best of my ability. Disposition Plan: tbd    Kymari Nuon, DO [facility] Direct contact: see [email or web address]  7PM-7AM contact night coverage as above 12/21/2020, 7:59 PM  LOS: 3 days

## 2020-12-21 NOTE — Evaluation (Addendum)
Physical Therapy Evaluation Patient Details Name: [name] MRN: [id number] DOB: July 13, 1975 Today's Date: 12/21/2020  History of Present Illness  45 y.o. female with medical history significant for Bipolar disorder, diabetes, alcohol use disorder and polysubstance abuse, FSH muscular dystrophy, with history of frequent falls, last seen by neurology in January 2020, who was brought in by EMS after she fell at home 2 days prior and was unable to get up.  Clinical Impression  Pt received supine in bed, lethargic yet agreeable to therapy with persistence of PT. Pt stated she would try to remain awake through session. Wakefulness did improve with conversation and mobility. Pt was able to roll bilaterally with MIN A using bed features. Supine>sit was attempted with some pt participation via PT VC and encouragement. Supine>sit was incomplete due to pt requesting to end with MAX A provided - pt reporting OT had been in to see her earlier and she remains fatigued from that session. Pt demo decreased strength, active and passive ROM in BLE, diminished sensation to B ankle/foot, significantly limited functional mobility and activity tolerance. PT rec STR at this time. Would benefit from skilled PT to address above deficits and promote optimal return to PLOF.      Recommendations for follow up therapy are one component of a multi-disciplinary discharge planning process, led by the attending physician.  Recommendations may be updated based on patient status, additional functional criteria and insurance authorization.  Follow Up Recommendations SNF;Supervision for mobility/OOB    Equipment Recommendations  Other (comment) (TBD at next venue of care)    Recommendations for Other Services       Precautions / Restrictions Precautions Precautions: Fall Precaution Comments: watch HR Restrictions Weight Bearing Restrictions: No      Mobility  Bed Mobility Overal bed mobility: Needs Assistance Bed  Mobility: Rolling;Supine to Sit Rolling: Min assist   Supine to sit: Total assist;HOB elevated Sit to supine: Max assist   General bed mobility comments: Pt attempted to assist with BLE and trunk lift via B HHA - due to fatigue, maneuver was incomplete and pt requested to return to bed. With VC for sequencing and body mechanics, pt reports she was unable to participate in boosting towards HOB. B rolling was performed with MIN A to guide hand towards bed rail.    Transfers                 General transfer comment: deferred for safety  Ambulation/Gait             General Gait Details: deferred  Stairs            Wheelchair Mobility    Modified Rankin (Stroke Patients Only)       Balance Overall balance assessment: Needs assistance Sitting-balance support: Feet supported;Bilateral upper extremity supported Sitting balance-Leahy Scale: Fair Sitting balance - Comments: not observed - incomplete supine to sit performed                                     Pertinent Vitals/Pain Pain Assessment: 0-10 Pain Score: 10-Worst pain ever Faces Pain Scale: Hurts even more Pain Location: back Pain Descriptors / Indicators: Aching;Discomfort;Grimacing;Guarding Pain Intervention(s): Limited activity within patient's tolerance;Monitored during session;Repositioned    Home Living Family/patient expects to be discharged to:: Private residence Living Arrangements: Non-relatives/Friends;Other relatives (cousin and roommate) Available Help at Discharge: Available PRN/intermittently;Family (cousin is home during the day) Type of Home:  Apartment Home Access: Stairs to enter Entrance Stairs-Rails: Can reach both Entrance Stairs-Number of Steps: lives on second floor apartment - 15-17 stairs with no elevator. Home Layout: One level Home Equipment: Walker - 2 wheels;Other (comment) Additional Comments: B AFOs    Prior Function Level of Independence: Needs  assistance   Gait / Transfers Assistance Needed: Pt reports 2 weeks ago she started using RW. She has B AFO's because of foot drop but reports she doesn't wear them because they are uncomfortable.  ADL's / Homemaking Assistance Needed: Pt reports she still drives, cousin brings groceries, and her and roommates share IADLs  Comments: Pt reports >10 falls in the last 6 months.     Hand Dominance   Dominant Hand: Right    Extremity/Trunk Assessment   Upper Extremity Assessment Upper Extremity Assessment: Generalized weakness    Lower Extremity Assessment Lower Extremity Assessment: Generalized weakness;RLE deficits/detail;LLE deficits/detail RLE Deficits / Details: Limited PROM - DF and knee extension; <3/5 MMT in all muscle groups RLE Sensation: decreased light touch LLE Deficits / Details: Limited PROM - DF; <3/5 MMT in all muscle groups LLE Sensation: decreased light touch       Communication   Communication: Expressive difficulties;Other (comment) (slurring of words)  Cognition Arousal/Alertness: Lethargic;Awake/alert (wakefulness improved over time) Behavior During Therapy: Flat affect Overall Cognitive Status: Within Functional Limits for tasks assessed                                 General Comments: A&Ox4      General Comments      Exercises Other Exercises Other Exercises: Pt education on limited ROM in BLE and implications on mobility, discomfort wearing AFOs related to decreased ROM and decreased muscle length. Educated on rec to d/c to STR due to safety concerns with mobility and stairs to enter apartment - pt agreeable. Pt educated on ideal range of SpO2 and importance of wearing supplemental O2 to ensure proper O2 perfusion to brain, organs and muscles - with education pt agreeable to donning O2 although she states it is uncomfortable.   Assessment/Plan    PT Assessment Patient needs continued PT services  PT Problem List Decreased  strength;Decreased mobility;Decreased safety awareness;Decreased range of motion;Decreased activity tolerance;Cardiopulmonary status limiting activity;Decreased balance       PT Treatment Interventions DME instruction;Therapeutic activities;Gait training;Therapeutic exercise;Patient/family education;Stair training;Balance training;Functional mobility training;Neuromuscular re-education    PT Goals (Current goals can be found in the Care Plan section)  Acute Rehab PT Goals Patient Stated Goal: to get better PT Goal Formulation: With patient Time For Goal Achievement: 01/04/21 Potential to Achieve Goals: Good    Frequency Min 2X/week   Barriers to discharge Inaccessible home environment 15-17 STE 2nd floor apartment    Co-evaluation               AM-PAC PT "6 Clicks" Mobility  Outcome Measure Help needed turning from your back to your side while in a flat bed without using bedrails?: A Little Help needed moving from lying on your back to sitting on the side of a flat bed without using bedrails?: A Lot Help needed moving to and from a bed to a chair (including a wheelchair)?: Total Help needed standing up from a chair using your arms (e.g., wheelchair or bedside chair)?: Total Help needed to walk in hospital room?: Total Help needed climbing 3-5 steps with a railing? : Total 6 Click Score: 9  End of Session Equipment Utilized During Treatment: Oxygen Activity Tolerance: Patient limited by lethargy;Patient limited by fatigue Patient left: in bed;with call bell/phone within reach;with bed alarm set Nurse Communication: Mobility status;Precautions PT Visit Diagnosis: Unsteadiness on feet (R26.81);Other abnormalities of gait and mobility (R26.89);Repeated falls (R29.6);Muscle weakness (generalized) (M62.81);History of falling (Z91.81);Difficulty in walking, not elsewhere classified (R26.2)    Time: 1018-1050 PT Time Calculation (min) (ACUTE ONLY): 32 min   Charges:   PT  Evaluation $PT Eval Moderate Complexity: 1 Mod PT Treatments $Therapeutic Activity: 23-37 mins        Basilia Jumbo PT, DPT 12/21/20 1:05 PM [phone]   Lavenia Atlas 12/21/2020, 1:05 PM

## 2020-12-21 NOTE — Progress Notes (Signed)
[facility]  ROUNDING NOTE   Subjective:   [name] is a 45 year old Caucasian female with a past medical history of bipolar disorder, diabetes mellitus, alcohol abuse, polysubstance abuse, muscular dystrophy who was brought to the ER with chief complaint of fall around 2 to 3 days back.  Patient seen laying in bed Alert and oriented Continues to have no appetite Denies nausea and vomiting    Objective:  Vital signs in last 24 hours:  Temp:  [97.8 F (36.6 C)-99 F (37.2 C)] 98.9 F (37.2 C) (10/05 0740) Pulse Rate:  [102-116] 116 (10/05 0740) Resp:  [18-20] 18 (10/05 0740) BP: (97-104)/(58-68) 104/68 (10/05 0740) SpO2:  [86 %-98 %] 88 % (10/05 0740)  Weight change:  Filed Weights   12/17/20 2255 12/18/20 1746  Weight: 90.7 kg 105.8 kg    Intake/Output: I/O last 3 completed shifts: In: 5086.2 [P.O.:340; I.V.:3140.1; IV Piggyback:1606.1] Out: 1900 [Urine:1900]   Intake/Output this shift:  No intake/output data recorded.  Physical Exam: General: NAD, laying in bed  Head: Normocephalic, atraumatic. Dry oral mucosal membranes  Eyes: Jaundiced sclera  Lungs:  Clear to auscultation, normal effort, [place] O2  Heart: Regular rate and rhythm  Abdomen:  Soft, nontender  Extremities:  1+ BLE peripheral edema. 2+ BUE  Neurologic: Alert moving all four extremities  Skin: Jaundiced, generalized bruising       Basic Metabolic Panel: Recent Labs  Lab 12/19/20 0541 12/19/20 1043 12/19/20 1547 12/19/20 2233 12/20/20 0534 12/20/20 1035 12/20/20 1406 12/20/20 2232 12/21/20 0648 12/21/20 1008  NA 122*   < > 126*  126* 129* 129* 129* 133* 132* 133* 130*  K 2.7*   < > 2.8* 3.1* 3.9  --  5.2*  --  3.5  --   CL 73*   < > 77* 81* 82*  --  90*  --  88*  --   CO2 38*   < > 40* 39* 38*  --  32  --  34*  --   GLUCOSE 181*   < > 155* 176* 220*  --  147*  --  141*  --   BUN <5*   < > <5* <5* <5*  --  <5*  --  <5*  --   CREATININE <0.30*   < > <0.30* <0.30* <0.30*   --  <0.30*  --  <0.30*  --   CALCIUM 6.9*   < > 7.0* 7.1* 7.2*  --  7.3*  --  7.3*  --   MG 1.6*  --  2.0  --  1.9  --  1.7  --  1.7  --   PHOS 1.0*  --  1.2*  --  UNABLE TO REPORT DUE TO ICTERUS  --   --   --  UNABLE TO REPORT DUE TO ICTERUS  --    < > = values in this interval not displayed.     Liver Function Tests: Recent Labs  Lab 12/17/20 2335 12/18/20 1205 12/19/20 0541 12/20/20 0534 12/21/20 0648  AST 283* 228* 209* 212* 178*  ALT 39 34 31 31 27   ALKPHOS 495* 362* 338* 339* 305*  BILITOT 11.9* 11.7* 12.9* 15.2* 14.8*  PROT 6.7 6.1* 5.9* 6.0* 5.7*  ALBUMIN 1.8* <1.5* <1.5* <1.5* <1.5*    Recent Labs  Lab 12/19/20 1547  LIPASE 29    Recent Labs  Lab 12/17/20 2335 12/19/20 0541  AMMONIA 52* 81*     CBC: Recent Labs  Lab 12/17/20 2335 12/19/20 0541  12/20/20 0534 12/21/20 0648  WBC 11.2* 16.9* 18.1* 17.4*  NEUTROABS  --  14.0*  --   --   HGB 13.4 9.0* 9.0* 8.7*  HCT 39.6 26.7* 29.4* 28.3*  MCV 93.8 99.6 103.5* 106.8*  PLT 132* 123* 94* 97*     Cardiac Enzymes: Recent Labs  Lab 12/17/20 2335 12/19/20 1547  CKTOTAL 153 56     BNP: Invalid input(s): POCBNP  CBG: Recent Labs  Lab 12/20/20 0747 12/20/20 1136 12/20/20 1558 12/20/20 2152 12/21/20 0735  GLUCAP 209* 162* 131* 134* 148*     Microbiology: Results for orders placed or performed during the hospital encounter of 12/17/20  Resp Panel by RT-PCR (Flu A&B, Covid) Nasopharyngeal Swab     Status: None   Collection Time: 12/18/20  1:16 AM   Specimen: Nasopharyngeal Swab; Nasopharyngeal(NP) swabs in vial transport medium  Result Value Ref Range Status   SARS Coronavirus 2 by RT PCR NEGATIVE NEGATIVE Final    Comment: (NOTE) SARS-CoV-2 target nucleic acids are NOT DETECTED.  The SARS-CoV-2 RNA is generally detectable in upper respiratory specimens during the acute phase of infection. The lowest concentration of SARS-CoV-2 viral copies this assay can detect is 138 copies/mL. A  negative result does not preclude SARS-Cov-2 infection and should not be used as the sole basis for treatment or other patient management decisions. A negative result may occur with  improper specimen collection/handling, submission of specimen other than nasopharyngeal swab, presence of viral mutation(s) within the areas targeted by this assay, and inadequate number of viral copies(<138 copies/mL). A negative result must be combined with clinical observations, patient history, and epidemiological information. The expected result is Negative.  Fact Sheet for Patients:  BloggerCourse.com  Fact Sheet for Healthcare Providers:  SeriousBroker.it  This test is no t yet approved or cleared by the [place] FDA and  has been authorized for detection and/or diagnosis of SARS-CoV-2 by FDA under an Emergency Use Authorization (EUA). This EUA will remain  in effect (meaning this test can be used) for the duration of the COVID-19 declaration under Section 564(b)(1) of the Act, 21 U.S.C.section 360bbb-3(b)(1), unless the authorization is terminated  or revoked sooner.       Influenza A by PCR NEGATIVE NEGATIVE Final   Influenza B by PCR NEGATIVE NEGATIVE Final    Comment: (NOTE) The Xpert Xpress SARS-CoV-2/FLU/RSV plus assay is intended as an aid in the diagnosis of influenza from Nasopharyngeal swab specimens and should not be used as a sole basis for treatment. Nasal washings and aspirates are unacceptable for Xpert Xpress SARS-CoV-2/FLU/RSV testing.  Fact Sheet for Patients: BloggerCourse.com  Fact Sheet for Healthcare Providers: SeriousBroker.it  This test is not yet approved or cleared by the [place] FDA and has been authorized for detection and/or diagnosis of SARS-CoV-2 by FDA under an Emergency Use Authorization (EUA). This EUA will remain in effect (meaning this test can  be used) for the duration of the COVID-19 declaration under Section 564(b)(1) of the Act, 21 U.S.C. section 360bbb-3(b)(1), unless the authorization is terminated or revoked.  Performed at [facility], [address] [place], [place] [address]   Blood culture (routine x 2)     Status: None (Preliminary result)   Collection Time: 12/18/20  1:16 AM   Specimen: BLOOD  Result Value Ref Range Status   Specimen Description BLOOD RIGHT HAND  Final   Special Requests   Final    BOTTLES DRAWN AEROBIC AND ANAEROBIC Blood Culture results may not be  optimal due to an inadequate volume of blood received in culture bottles   Culture   Final    NO GROWTH 3 DAYS Performed at [facility], [address] [place], [place] [address]    Report Status PENDING  Incomplete  Blood culture (routine x 2)     Status: None (Preliminary result)   Collection Time: 12/18/20  1:16 AM   Specimen: BLOOD  Result Value Ref Range Status   Specimen Description BLOOD RIGHT ASSIST CONTROL  Final   Special Requests   Final    BOTTLES DRAWN AEROBIC AND ANAEROBIC Blood Culture adequate volume   Culture   Final    NO GROWTH 3 DAYS Performed at [facility], [address], [place], [place] [address]    Report Status PENDING  Incomplete  MRSA Next Gen by PCR, Nasal     Status: Abnormal   Collection Time: 12/19/20  4:04 PM   Specimen: Nasal Mucosa; Nasal Swab  Result Value Ref Range Status   MRSA by PCR Next Gen DETECTED (A) NOT DETECTED Final    Comment: RESULT CALLED TO, READ BACK BY AND VERIFIED WITH: AMY DOLTON @1830  ON 12/19/20 SKL (NOTE) The GeneXpert MRSA Assay (FDA approved for NASAL specimens only), is one component of a comprehensive MRSA colonization surveillance program. It is not intended to diagnose MRSA infection nor to guide or monitor treatment for MRSA infections. Test performance is not FDA approved in patients less than 66 years old. Performed at [facility], [address] [place], [place]     Coagulation Studies: Recent Labs    12/19/20 0541  LABPROT 15.6*  INR 1.2     Urinalysis: Recent Labs    12/19/20 1110  COLORURINE AMBER*  LABSPEC 1.008  PHURINE 7.0  GLUCOSEU NEGATIVE  HGBUR MODERATE*  BILIRUBINUR MODERATE*  KETONESUR NEGATIVE  PROTEINUR NEGATIVE  NITRITE NEGATIVE  LEUKOCYTESUR NEGATIVE       Imaging: 02/18/21 LIVER DOPPLER  Result Date: 12/20/2020 CLINICAL DATA:  Abnormal liver function tests. EXAM: DUPLEX ULTRASOUND OF LIVER TECHNIQUE: Color and duplex Doppler ultrasound was performed to evaluate the hepatic in-flow and out-flow vessels. COMPARISON:  December 18, 2020. FINDINGS: Liver: Increased parenchymal echogenicity. Normal hepatic contour without nodularity. No focal lesion, mass or intrahepatic biliary ductal dilatation. Main Portal Vein size: 0.9 cm Portal Vein Velocities Main Prox:  29.4 cm/sec Main Mid: 24.8 cm/sec Main Dist:  22.2 cm/sec Right: 20.6 cm/sec Left: 19.0 cm/sec Normal hepatopetal flow is noted in the portal veins. Hepatic Vein Velocities Right:  Not visualized. Middle:  Not visualized. Left:  Not visualized. IVC: Present and patent with normal respiratory phasicity. Hepatic Artery Velocity:  48.1 cm/sec Splenic Vein Velocity:  10.1 cm/sec Spleen: 9.0 cm x 5.0 cm x 11.2 cm with a total volume of 266 cm^3 (411 cm^3 is upper limit normal) Portal Vein Occlusion/Thrombus: No Splenic Vein Occlusion/Thrombus: No Ascites: Present. Varices: None Limited exam due to body habitus and bowel gas. IMPRESSION: Limited exam due to body habitus and overlying bowel gas. There is no definite evidence of portal or splenic venous thrombosis. Due to above described limitations, hepatic veins are not visualized. Minimal to mild ascites is noted. Electronically Signed   By: December 20, 2020 M.D.   On: 12/20/2020 12:10     Medications:    sodium chloride Stopped (12/18/20 2305)   0.9 % NaCl with KCl 20 mEq / L      thiamine injection 500 mg (12/21/20 0647)  vancomycin Stopped (12/20/20 2254)    budesonide  9 mg Oral Daily   cariprazine  3 mg Oral Daily   clonazePAM  1 mg Oral BID   enoxaparin (LOVENOX) injection  0.5 mg/kg Subcutaneous Q24H   feeding supplement  237 mL Oral BID BM   FLUoxetine  80 mg Oral Daily   fluticasone  1 spray Each Nare Daily   folic acid  1 mg Oral Daily   insulin aspart  0-20 Units Subcutaneous TID WC   insulin aspart  0-5 Units Subcutaneous QHS   insulin glargine-yfgn  8 Units Subcutaneous QHS   LORazepam  0-4 mg Intravenous Q12H   Or   LORazepam  0-4 mg Oral Q12H   multivitamin with minerals  1 tablet Oral Daily   pantoprazole (PROTONIX) IV  40 mg Intravenous Q12H   pregabalin  75-150 mg Oral TID   rifaximin  550 mg Oral BID   [START ON 12/23/2020] thiamine  100 mg Oral Daily   sodium chloride, albuterol, albuterol, lactulose, methocarbamol, ondansetron **OR** ondansetron (ZOFRAN) IV  Assessment/ Plan:  Ms. CHERAY PARDI is a 45 y.o.  female with a past medical history of bipolar disorder, diabetes mellitus, alcohol abuse, polysubstance abuse, muscular dystrophy who was brought to the ER with chief complaint of fall around 2 to 3 days back.    Severe hyponatremia believed due to hypovolemia due to patient statements of not eating of 2 days while laying on floor. Also believed to have low osmolar intake resulting in BUN <5. Goal correction of 8 mEq /24 hrs. Sodium level of 113 on admission. Tolvaptan held due to liver failure. Hypertonic solution held due to improvement with patient presentation. Sodium improved to 133. IVF ordered with potassium supplementation until nutrition improves.   Sepsis likely due to aspiration pneumonia. Chest X-ray show right lower lobe infiltrates. Receiving antibiotics  Alcoholic liver disease with elevated total bilirubin and ammonia level. CIWA protocol in place.   Hypokalemia - Current level 3.5. IVF ordered 0.9% NS with 20 Kcl at  27ml/hr    LOS: 3   10/5/202212:01 PM

## 2020-12-21 NOTE — Progress Notes (Signed)
[facility] for Electrolyte Monitoring and Replacement   Recent Labs: Potassium (mmol/L)  Date Value  12/20/2020 5.2 (H)   Magnesium (mg/dL)  Date Value  12/20/2020 1.7   Calcium (mg/dL)  Date Value  12/20/2020 7.3 (L)   Albumin (g/dL)  Date Value  12/20/2020 <1.5 (L)   Phosphorus (mg/dL)  Date Value  12/20/2020 UNABLE TO REPORT DUE TO ICTERUS   Sodium (mmol/L)  Date Value  12/20/2020 132 (L)     Assessment: 45yo F with PMH Bipolar disorder, diabetes, alcohol use disorder and polysubstance abuse, FSH muscular dystrophy, with history of falls who was admitted to the hospital after she fell at home 2 days prior and was unable to get up. Pt was noted to be jaundiced on admin with AST 283, alk phos 495, total bilirubin 11.9, and ammonia 52.   Current electrolyte abnormalities likely due to refeeding syndrome/poor nutritional status given alcohol abuse  K 3.9>5.2 (hemolysis present)>3.5 Na 129>129>133>132>133>130 Ca 7.2>7.3>7.3 10/5 Phos unable to report due to icterus   Pt is receiving NS @ 182mL/hr  Goal of Therapy:  Electrolytes WNL  Plan:  -Will continue NS $RemoveBef'@100mL'[id number]$ /hr -No additional replacement indicated at this time -Will re-check BMP, Mg and phos with AM labs, continue to monitor electrolytes and replace as indicated   Narda Rutherford, PharmD Pharmacy Resident  12/21/2020 1:52 PM

## 2020-12-21 NOTE — Progress Notes (Signed)
Occupational Therapy Treatment Patient Details Name: [name] MRN: [id number] DOB: January 04, 1976 Today's Date: 12/21/2020   History of present illness 45 y.o. female with medical history significant for Bipolar disorder, diabetes, alcohol use disorder and polysubstance abuse, FSH muscular dystrophy, with history of frequent falls, last seen by neurology in January 2020, who was brought in by EMS after she fell at home 2 days prior and was unable to get up.   OT comments  Upon entering the room, pt was more alert this session. She was agreeable to OT intervention with encouragement. Pt needing mod A for supine >sit with pt reporting 10/10 pain in back. Pt able to remain in static sitting for ~ 8 minutes with close supervision- min guard. Pt does report dizziness while seated EOB. She reports she has not been to EOB for several days. Pt's HR increased to 120's while seated. OT calls RN for pain medication. Max A to return to supine and for repositioning. Bed alarm activated and all needs within reach.    Recommendations for follow up therapy are one component of a multi-disciplinary discharge planning process, led by the attending physician.  Recommendations may be updated based on patient status, additional functional criteria and insurance authorization.    Follow Up Recommendations  SNF    Equipment Recommendations  3 in 1 bedside commode       Precautions / Restrictions Precautions Precautions: Fall Precaution Comments: watch HR Restrictions Weight Bearing Restrictions: No       Mobility Bed Mobility Overal bed mobility: Needs Assistance Bed Mobility: Supine to Sit;Sit to Supine     Supine to sit: Mod assist Sit to supine: Max assist   General bed mobility comments: Pt needing assistance with B LEs and trunk support for bed mobility with increased pain    Transfers                 General transfer comment: deferred for safety    Balance Overall balance  assessment: Needs assistance Sitting-balance support: Feet supported;Bilateral upper extremity supported Sitting balance-Leahy Scale: Fair Sitting balance - Comments: supervison - min guard                                   ADL either performed or assessed with clinical judgement     Vision Patient Visual Report: No change from baseline            Cognition Arousal/Alertness: Lethargic;Awake/alert (wakefulness improved over time) Behavior During Therapy: Flat affect Overall Cognitive Status: Within Functional Limits for tasks assessed                                 General Comments: A&Ox4                   Pertinent Vitals/ Pain       Pain Assessment: 0-10 Pain Score: 10-Worst pain ever Faces Pain Scale: Hurts even more Pain Location: back Pain Descriptors / Indicators: Aching;Discomfort;Grimacing;Guarding Pain Intervention(s): Limited activity within patient's tolerance;Monitored during session;Repositioned  Home Living Family/patient expects to be discharged to:: Private residence Living Arrangements: Non-relatives/Friends;Other relatives (cousin and roommate) Available Help at Discharge: Available PRN/intermittently;Family (cousin is home during the day) Type of Home: Apartment Home Access: Stairs to enter [employer] of Steps: lives on second floor apartment - 15-17 stairs with no elevator. Entrance Stairs-Rails: Can reach  both Home Layout: One level               Home Equipment: Walker - 2 wheels;Other (comment)   Additional Comments: B AFOs      Prior Functioning/Environment Level of Independence: Needs assistance  Gait / Transfers Assistance Needed: Pt reports 2 weeks ago she started using RW. She has B AFO's because of foot drop but reports she doesn't wear them because they are uncomfortable. ADL's / Homemaking Assistance Needed: Pt reports she still drives, cousin brings groceries, and her and roommates  share IADLs   Comments: Pt reports >10 falls in the last 6 months.   Frequency  Min 2X/week        Progress Toward Goals  OT Goals(current goals can now be found in the care plan section)  Progress towards OT goals: Progressing toward goals  Acute Rehab OT Goals Patient Stated Goal: to go home OT Goal Formulation: With patient Time For Goal Achievement: 01/02/21 Potential to Achieve Goals: Fair  Plan Discharge plan remains appropriate       AM-PAC OT "6 Clicks" Daily Activity     Outcome Measure   Help from another person eating meals?: A Little Help from another person taking care of personal grooming?: A Little Help from another person toileting, which includes using toliet, bedpan, or urinal?: A Lot Help from another person bathing (including washing, rinsing, drying)?: A Lot Help from another person to put on and taking off regular upper body clothing?: A Lot Help from another person to put on and taking off regular lower body clothing?: Total 6 Click Score: 13    End of Session    OT Visit Diagnosis: Unsteadiness on feet (R26.81);Muscle weakness (generalized) (M62.81);History of falling (Z91.81)   Activity Tolerance Patient limited by fatigue;Patient limited by lethargy   Patient Left in bed;with call bell/phone within reach;with bed alarm set   Nurse Communication Mobility status;Patient requests pain meds        Time: 1191-4782 OT Time Calculation (min): 15 min  Charges: OT General Charges $OT Visit: 1 Visit OT Treatments $Therapeutic Activity: 8-22 mins  Jackquline Denmark, MS, OTR/L , CBIS ascom [phone]  12/21/20, 11:02 AM

## 2020-12-21 NOTE — Plan of Care (Signed)
  Problem: Clinical Measurements: Goal: Respiratory complications will improve Outcome: Progressing   Problem: Coping: Goal: Level of anxiety will decrease Outcome: Progressing   Problem: Safety: Goal: Ability to remain free from injury will improve Outcome: Progressing   Problem: Education: Goal: Knowledge of disease or condition will improve Outcome: Progressing

## 2020-12-22 DIAGNOSIS — E871 Hypo-osmolality and hyponatremia: Secondary | ICD-10-CM | POA: Diagnosis not present

## 2020-12-22 DIAGNOSIS — A419 Sepsis, unspecified organism: Secondary | ICD-10-CM | POA: Diagnosis not present

## 2020-12-22 DIAGNOSIS — F1020 Alcohol dependence, uncomplicated: Secondary | ICD-10-CM | POA: Diagnosis not present

## 2020-12-22 DIAGNOSIS — K7682 Hepatic encephalopathy: Secondary | ICD-10-CM | POA: Diagnosis not present

## 2020-12-22 LAB — GLUCOSE, CAPILLARY
Glucose-Capillary: 184 mg/dL — ABNORMAL HIGH (ref 70–99)
Glucose-Capillary: 214 mg/dL — ABNORMAL HIGH (ref 70–99)
Glucose-Capillary: 247 mg/dL — ABNORMAL HIGH (ref 70–99)
Glucose-Capillary: 183 mg/dL — ABNORMAL HIGH (ref 70–99)

## 2020-12-22 LAB — CBC
HCT: 32.6 % — ABNORMAL LOW (ref 36.0–46.0)
Hemoglobin: 9.8 g/dL — ABNORMAL LOW (ref 12.0–15.0)
MCH: 34 pg (ref 26.0–34.0)
MCHC: 30.1 g/dL (ref 30.0–36.0)
MCV: 113.2 fL — ABNORMAL HIGH (ref 80.0–100.0)
Platelets: 120 10*3/uL — ABNORMAL LOW (ref 150–400)
RBC: 2.88 MIL/uL — ABNORMAL LOW (ref 3.87–5.11)
RDW: 24.6 % — ABNORMAL HIGH (ref 11.5–15.5)
WBC: 18.4 10*3/uL — ABNORMAL HIGH (ref 4.0–10.5)
nRBC: 1.2 % — ABNORMAL HIGH (ref 0.0–0.2)

## 2020-12-22 LAB — BASIC METABOLIC PANEL
Anion gap: 11 (ref 5–15)
BUN: 7 mg/dL (ref 6–20)
CO2: 33 mmol/L — ABNORMAL HIGH (ref 22–32)
Calcium: 7.8 mg/dL — ABNORMAL LOW (ref 8.9–10.3)
Chloride: 92 mmol/L — ABNORMAL LOW (ref 98–111)
Creatinine, Ser: <0.3 mg/dL — ABNORMAL LOW (ref 0.44–1.00)
Glucose, Bld: 172 mg/dL — ABNORMAL HIGH (ref 70–99)
Potassium: 3.9 mmol/L (ref 3.5–5.1)
Sodium: 136 mmol/L (ref 135–145)

## 2020-12-22 LAB — SODIUM
Sodium: 133 mmol/L — ABNORMAL LOW (ref 135–145)
Sodium: 132 mmol/L — ABNORMAL LOW (ref 135–145)
Sodium: 133 mmol/L — ABNORMAL LOW (ref 135–145)

## 2020-12-22 LAB — PHOSPHORUS: Phosphorus: UNDETERMINED mg/dL (ref 2.5–4.6)

## 2020-12-22 LAB — MAGNESIUM: Magnesium: 1.8 mg/dL (ref 1.7–2.4)

## 2020-12-22 MED ORDER — LINEZOLID 600 MG PO TABS
600.0000 mg | ORAL_TABLET | Freq: Two times a day (BID) | ORAL | Status: DC
  Administered 2020-12-22 – 2020-12-27 (×10): 600 mg via ORAL
  Filled 2020-12-22 (×11): qty 1

## 2020-12-22 MED ORDER — LACTULOSE 10 GM/15ML PO SOLN
20.0000 g | Freq: Three times a day (TID) | ORAL | Status: DC
  Administered 2020-12-22 – 2021-01-04 (×29): 20 g via ORAL
  Filled 2020-12-22 (×32): qty 30

## 2020-12-22 MED ORDER — OLANZAPINE 5 MG PO TABS
5.0000 mg | ORAL_TABLET | Freq: Every day | ORAL | Status: DC
  Administered 2020-12-22 – 2021-01-10 (×20): 5 mg via ORAL
  Filled 2020-12-22 (×20): qty 1

## 2020-12-22 NOTE — Progress Notes (Signed)
[facility]  ROUNDING NOTE   Subjective:   [name] is a 45 year old Caucasian female with a past medical history of bipolar disorder, diabetes mellitus, alcohol abuse, polysubstance abuse, muscular dystrophy who was brought to the ER with chief complaint of fall around 2 to 3 days back.  Patient seen laying in bed Drowsy this morning Appears comfortable without difficulty breathing Very little interaction today    Objective:  Vital signs in last 24 hours:  Temp:  [97.8 F (36.6 C)-98.7 F (37.1 C)] 98.5 F (36.9 C) (10/06 1257) Pulse Rate:  [100-110] 100 (10/06 1257) Resp:  [14-20] 19 (10/06 1257) BP: (109-130)/(74-90) 109/75 (10/06 1257) SpO2:  [92 %-97 %] 96 % (10/06 1257)  Weight change:  Filed Weights   12/17/20 2255 12/18/20 1746  Weight: 90.7 kg 105.8 kg    Intake/Output: I/O last 3 completed shifts: In: 1635.5 [P.O.:240; I.V.:1100; IV Piggyback:295.5] Out: 1550 [Urine:1550]   Intake/Output this shift:  No intake/output data recorded.  Physical Exam: General: NAD, laying in bed  Head: Normocephalic, atraumatic. Dry oral mucosal membranes  Eyes: Jaundiced sclera  Lungs:  Clear to auscultation, normal effort, [place]ult O2  Heart: Regular rate and rhythm  Abdomen:  Soft, nontender  Extremities:  1+ BLE peripheral edema. 2+ BUE  Neurologic: Alert moving all four extremities  Skin: Jaundiced, generalized bruising       Basic Metabolic Panel: Recent Labs  Lab 12/19/20 0541 12/19/20 1043 12/19/20 1547 12/19/20 2233 12/20/20 0534 12/20/20 1035 12/20/20 1406 12/20/20 2232 12/21/20 0648 12/21/20 1008 12/21/20 1641 12/21/20 2226 12/22/20 0525 12/22/20 1047  NA 122*   < > 126*  126* 129* 129*   < > 133*   < > 133* 130* 130* 133* 136 133*  K 2.7*   < > 2.8* 3.1* 3.9  --  5.2*  --  3.5  --   --   --  3.9  --   CL 73*   < > 77* 81* 82*  --  90*  --  88*  --   --   --  92*  --   CO2 38*   < > 40* 39* 38*  --  32  --  34*  --   --   --  33*   --   GLUCOSE 181*   < > 155* 176* 220*  --  147*  --  141*  --   --   --  172*  --   BUN <5*   < > <5* <5* <5*  --  <5*  --  <5*  --   --   --  7  --   CREATININE <0.30*   < > <0.30* <0.30* <0.30*  --  <0.30*  --  <0.30*  --   --   --  <0.30*  --   CALCIUM 6.9*   < > 7.0* 7.1* 7.2*  --  7.3*  --  7.3*  --   --   --  7.8*  --   MG 1.6*  --  2.0  --  1.9  --  1.7  --  1.7  --   --   --  1.8  --   PHOS 1.0*  --  1.2*  --  UNABLE TO REPORT DUE TO ICTERUS  --   --   --  UNABLE TO REPORT DUE TO ICTERUS  --   --   --  UNABLE TO REPORT DUE TO ICTERUS  --    < > =  values in this interval not displayed.     Liver Function Tests: Recent Labs  Lab 12/17/20 2335 12/18/20 1205 12/19/20 0541 12/20/20 0534 12/21/20 0648  AST 283* 228* 209* 212* 178*  ALT 39 34 31 31 27   ALKPHOS 495* 362* 338* 339* 305*  BILITOT 11.9* 11.7* 12.9* 15.2* 14.8*  PROT 6.7 6.1* 5.9* 6.0* 5.7*  ALBUMIN 1.8* <1.5* <1.5* <1.5* <1.5*    Recent Labs  Lab 12/19/20 1547  LIPASE 29    Recent Labs  Lab 12/17/20 2335 12/19/20 0541  AMMONIA 52* 81*     CBC: Recent Labs  Lab 12/17/20 2335 12/19/20 0541 12/20/20 0534 12/21/20 0648 12/22/20 0741  WBC 11.2* 16.9* 18.1* 17.4* 18.4*  NEUTROABS  --  14.0*  --   --   --   HGB 13.4 9.0* 9.0* 8.7* 9.8*  HCT 39.6 26.7* 29.4* 28.3* 32.6*  MCV 93.8 99.6 103.5* 106.8* 113.2*  PLT 132* 123* 94* 97* 120*     Cardiac Enzymes: Recent Labs  Lab 12/17/20 2335 12/19/20 1547  CKTOTAL 153 56     BNP: Invalid input(s): POCBNP  CBG: Recent Labs  Lab 12/21/20 0735 12/21/20 1226 12/21/20 1551 12/22/20 0849 12/22/20 1240  GLUCAP 148* 136* 172* 184* 214*     Microbiology: Results for orders placed or performed during the hospital encounter of 12/17/20  Resp Panel by RT-PCR (Flu A&B, Covid) Nasopharyngeal Swab     Status: None   Collection Time: 12/18/20  1:16 AM   Specimen: Nasopharyngeal Swab; Nasopharyngeal(NP) swabs in vial transport medium  Result Value  Ref Range Status   SARS Coronavirus 2 by RT PCR NEGATIVE NEGATIVE Final    Comment: (NOTE) SARS-CoV-2 target nucleic acids are NOT DETECTED.  The SARS-CoV-2 RNA is generally detectable in upper respiratory specimens during the acute phase of infection. The lowest concentration of SARS-CoV-2 viral copies this assay can detect is 138 copies/mL. A negative result does not preclude SARS-Cov-2 infection and should not be used as the sole basis for treatment or other patient management decisions. A negative result may occur with  improper specimen collection/handling, submission of specimen other than nasopharyngeal swab, presence of viral mutation(s) within the areas targeted by this assay, and inadequate number of viral copies(<138 copies/mL). A negative result must be combined with clinical observations, patient history, and epidemiological information. The expected result is Negative.  Fact Sheet for Patients:  02/17/21  Fact Sheet for Healthcare Providers:  BloggerCourse.com  This test is no t yet approved or cleared by the [employer]ious[employer]ker.[place] FDA and  has been authorized for detection and/or diagnosis of SARS-CoV-2 by FDA under an Emergency Use Authorization (EUA). This EUA will remain  in effect (meaning this test can be used) for the duration of the COVID-19 declaration under Section 564(b)(1) of the Act, 21 U.S.C.section 360bbb-3(b)(1), unless the authorization is terminated  or revoked sooner.       Influenza A by PCR NEGATIVE NEGATIVE Final   Influenza B by PCR NEGATIVE NEGATIVE Final    Comment: (NOTE) The Xpert Xpress SARS-CoV-2/FLU/RSV plus assay is intended as an aid in the diagnosis of influenza from Nasopharyngeal swab specimens and should not be used as a sole basis for treatment. Nasal washings and aspirates are unacceptable for Xpert Xpress SARS-CoV-2/FLU/RSV testing.  Fact Sheet for  Patients: [place]  Fact Sheet for Healthcare Providers: BloggerCourse.com  This test is not yet approved or cleared by the SeriousBroker.it FDA and has been authorized for detection and/or diagnosis of SARS-CoV-2 by  FDA under an Emergency Use Authorization (EUA). This EUA will remain in effect (meaning this test can be used) for the duration of the COVID-19 declaration under Section 564(b)(1) of the Act, 21 U.S.C. section 360bbb-3(b)(1), unless the authorization is terminated or revoked.  Performed at [facility], [address] [place], [place] [address]   Blood culture (routine x 2)     Status: None (Preliminary result)   Collection Time: 12/18/20  1:16 AM   Specimen: BLOOD  Result Value Ref Range Status   Specimen Description BLOOD RIGHT HAND  Final   Special Requests   Final    BOTTLES DRAWN AEROBIC AND ANAEROBIC Blood Culture results may not be optimal due to an inadequate volume of blood received in culture bottles   Culture   Final    NO GROWTH 4 DAYS Performed at [facility], [address] [place], [place] [address]    Report Status PENDING  Incomplete  Blood culture (routine x 2)     Status: None (Preliminary result)   Collection Time: 12/18/20  1:16 AM   Specimen: BLOOD  Result Value Ref Range Status   Specimen Description BLOOD RIGHT ASSIST CONTROL  Final   Special Requests   Final    BOTTLES DRAWN AEROBIC AND ANAEROBIC Blood Culture adequate volume   Culture   Final    NO GROWTH 4 DAYS Performed at [facility], [address], [place], [place] [address]    Report Status PENDING  Incomplete  MRSA Next Gen by PCR, Nasal     Status: Abnormal   Collection Time: 12/19/20  4:04 PM   Specimen: Nasal Mucosa; Nasal Swab  Result Value Ref Range Status   MRSA by PCR Next Gen DETECTED (A) NOT DETECTED Final    Comment: RESULT CALLED TO, READ BACK BY AND VERIFIED WITH: AMY DOLTON  @1830  ON 12/19/20 SKL (NOTE) The GeneXpert MRSA Assay (FDA approved for NASAL specimens only), is one component of a comprehensive MRSA colonization surveillance program. It is not intended to diagnose MRSA infection nor to guide or monitor treatment for MRSA infections. Test performance is not FDA approved in patients less than 95 years old. Performed at [facility], [address] [place], [place]     Coagulation Studies: No results for input(s): LABPROT, INR in the last 72 hours.   Urinalysis: No results for input(s): COLORURINE, LABSPEC, PHURINE, GLUCOSEU, HGBUR, BILIRUBINUR, KETONESUR, PROTEINUR, UROBILINOGEN, NITRITE, LEUKOCYTESUR in the last 72 hours.  Invalid input(s): APPERANCEUR     Imaging: No results found.   Medications:    sodium chloride Stopped (12/18/20 2305)   0.9 % NaCl with KCl 20 mEq / L 50 mL/hr at 12/21/20 1229   thiamine injection 500 mg (12/22/20 0527)    budesonide  9 mg Oral Daily   Chlorhexidine Gluconate Cloth  6 each Topical Q0600   clonazePAM  1 mg Oral BID   enoxaparin (LOVENOX) injection  0.5 mg/kg Subcutaneous Q24H   feeding supplement  237 mL Oral BID BM   FLUoxetine  80 mg Oral Daily   fluticasone  1 spray Each Nare Daily   folic acid  1 mg Oral Daily   insulin aspart  0-20 Units Subcutaneous TID WC   insulin aspart  0-5 Units Subcutaneous QHS   insulin glargine-yfgn  8 Units Subcutaneous QHS   linezolid  600 mg Oral Q12H   multivitamin with minerals  1 tablet Oral Daily   mupirocin ointment  1 application  Nasal BID   OLANZapine  5 mg Oral Daily   pantoprazole (PROTONIX) IV  40 mg Intravenous Q12H   pregabalin  75-150 mg Oral TID   rifaximin  550 mg Oral BID   [START ON 12/23/2020] thiamine  100 mg Oral Daily   sodium chloride, albuterol, albuterol, lactulose, methocarbamol, ondansetron **OR** ondansetron (ZOFRAN) IV  Assessment/ Plan:  Ms. MALAY FANTROY is a 45 y.o.  female with a past medical history of  bipolar disorder, diabetes mellitus, alcohol abuse, polysubstance abuse, muscular dystrophy who was brought to the ER with chief complaint of fall around 2 to 3 days back.    Severe hyponatremia believed due to hypovolemia due to patient statements of not eating of 2 days while laying on floor. Also believed to have low osmolar intake resulting in BUN <5. Goal correction of 8 mEq /24 hrs. Sodium level of 113 on admission. Tolvaptan held due to liver failure. Hypertonic solution held due to improvement with patient presentation. Sodium improved to 136 today. IVF ordered with potassium supplementation until nutrition improves. If sodium remain stable tomorrow, will consider signing off.   Sepsis likely due to aspiration pneumonia. Chest X-ray show right lower lobe infiltrates. Linezolid ordered twice a day  Alcoholic liver disease with elevated total bilirubin and ammonia level. CIWA protocol in place.   Hypokalemia - Current level 3.9. Continue IVF ordered 0.9% NS with 20 Kcl at 97ml/hr    LOS: 4   10/6/20221:34 PM

## 2020-12-22 NOTE — TOC Initial Note (Addendum)
Transition of Care [facility]) - Initial/Assessment Note    Patient Details  Name: [name] MRN: [id number] Date of Birth: November 20, 1975  Transition of [facility] CM/SW Contact:    Gildardo Griffes, LCSW Phone Number: 12/22/2020, 1:12 PM  Clinical Narrative:                  Update: Patient's mother [name] has been updated at [phone] that plan is for SNF pending bed offers. No questions or concerns at this time.      CSW spoke with patient and discussed PT recommendation of SNF at time of discharge due to patient needing maximum assistance with mobility. Patient reports she is in agreement with this recommendation and has no preference of facility.   CSW was transparent with patient that due to alcohol and polysubstance abuse disorders this may be a barrier in placement however CSW will send out referral for bed offers.   Pending bed offer at this time.    Expected Discharge Plan: Skilled Nursing Facility Barriers to Discharge: Continued Medical Work up   Patient Goals and CMS Choice Patient states their goals for this hospitalization and ongoing recovery are:: to go home CMS Medicare.gov Compare Post Acute Care list provided to:: Patient Choice offered to / list presented to : Patient  Expected Discharge Plan and Services Expected Discharge Plan: Skilled Nursing Facility     Post Acute Care Choice: Skilled Nursing Facility Living arrangements for the past 2 months: Single Family Home                                      Prior Living Arrangements/Services Living arrangements for the past 2 months: Single Family Home Lives with:: Self Patient language and need for interpreter reviewed:: Yes Do you feel safe going back to the place where you live?: Yes      Need for Family Participation in Patient Care: Yes (Comment) Care giver support system in place?: Yes (comment)   Criminal Activity/Legal Involvement Pertinent to Current Situation/Hospitalization: No -  Comment as needed  Activities of Daily Living Home Assistive Devices/Equipment: None ADL Screening (condition at time of admission) Patient's cognitive ability adequate to safely complete daily activities?: No Is the patient deaf or have difficulty hearing?: No Does the patient have difficulty seeing, even when wearing glasses/contacts?: No Does the patient have difficulty concentrating, remembering, or making decisions?: No Patient able to express need for assistance with ADLs?: Yes Does the patient have difficulty dressing or bathing?: Yes Independently performs ADLs?: No Communication: Independent Dressing (OT): Needs assistance Is this a change from baseline?: Change from baseline, expected to last <3days Grooming: Needs assistance Is this a change from baseline?: Change from baseline, expected to last <3 days Feeding: Independent Bathing: Needs assistance Is this a change from baseline?: Change from baseline, expected to last <3 days Toileting: Needs assistance Is this a change from baseline?: Change from baseline, expected to last <3 days In/Out Bed: Needs assistance Is this a change from baseline?: Change from baseline, expected to last <3 days Walks in Home: Needs assistance Is this a change from baseline?: Change from baseline, expected to last <3 days Does the patient have difficulty walking or climbing stairs?: Yes Weakness of Legs: Both Weakness of Arms/Hands: None  Permission Sought/Granted Permission sought to share information with : Case Manager, Magazine features editor, Family Supports Permission granted to share information with : Yes, Verbal  Permission Granted     Permission granted to share info w AGENCY: SNFs        Emotional Assessment Appearance:: Appears stated age       Alcohol / Substance Use: Not Applicable Psych Involvement: No (comment)  Admission diagnosis:  Acute hyponatremia [E87.1] Hypokalemia [E87.6] Hyponatremia  [E87.1] Weakness [R53.1] Aspiration pneumonia of right lower lobe, unspecified aspiration pneumonia type ([name]) [J69.0] Patient Active Problem List   Diagnosis Date Noted   Acute hepatic encephalopathy 12/20/2020   Sepsis ([name]) 12/19/2020   Acute hyponatremia 12/18/2020   Alcohol use disorder, moderate, dependence ([name]) 12/18/2020   Hepatic steatosis 12/18/2020   Polysubstance abuse ([name]) 12/18/2020   Hyperglycemia due to type 2 diabetes mellitus ([name]) 12/18/2020   Hyperbilirubinemia 12/18/2020   Lactic acidosis 12/18/2020   Alcoholic liver disease ([name]) 12/18/2020   Bipolar 1 disorder, manic, moderate ([name]) 09/10/2018   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   Diabetes ([name]) 09/10/2018   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   Anxiety and depression 10/01/2012   Protein-calorie malnutrition, severe ([name]) 09/30/2012   Alcohol abuse 09/26/2012   Trichomonas infection 09/26/2012   Dehydration 09/26/2012   Hypokalemia 11/14/2011   Thrombocytopenia ([name]) 11/14/2011   Alcohol withdrawal ([name]) 11/13/2011   Bipolar 2 disorder ([name]) 11/13/2011   Tobacco abuse 11/13/2011   Cocaine abuse ([name]) 11/13/2011   Gout 11/13/2011   PCP:  Eunice Blase, PA-C Pharmacy:   [id number] - [place], [place] - [address] AT [facility] OF [address] [address] [place] [address] Phone: [phone] Fax: [phone]     Social Determinants of Health (SDOH) Interventions    Readmission Risk Interventions No flowsheet data found.

## 2020-12-22 NOTE — NC FL2 (Signed)
[place] MEDICAID FL2 LEVEL OF CARE SCREENING TOOL     IDENTIFICATION  Patient Name: [name] Birthdate: [birth year] Sex: female Admission Date (Current Location): 12/17/2020  [facility] and [place] Number:  Chiropodist and Address:  [facility], [address] [place], [place] [address]      Provider Number: [id number]  Attending Physician Name and Address:  Fran Lowes, DO  Relative Name and Phone Number:  Bonita Quin (mother) [phone]    Current Level of Care: Hospital Recommended Level of Care: Skilled Nursing Facility Prior Approval Number:    Date Approved/Denied:   PASRR Number:    Discharge Plan: SNF    Current Diagnoses: Patient Active Problem List   Diagnosis Date Noted   Acute hepatic encephalopathy 12/20/2020   Sepsis ([name]) 12/19/2020   Acute hyponatremia 12/18/2020   Alcohol use disorder, moderate, dependence ([name]) 12/18/2020   Hepatic steatosis 12/18/2020   Polysubstance abuse ([name]) 12/18/2020   Hyperglycemia due to type 2 diabetes mellitus ([name]) 12/18/2020   Hyperbilirubinemia 12/18/2020   Lactic acidosis 12/18/2020   Alcoholic liver disease ([name]) 12/18/2020   Bipolar 1 disorder, manic, moderate ([name]) 09/10/2018   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   Diabetes ([name]) 09/10/2018   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   Anxiety and depression 10/01/2012   Protein-calorie malnutrition, severe ([name]) 09/30/2012   Alcohol abuse 09/26/2012   Trichomonas infection 09/26/2012   Dehydration 09/26/2012   Hypokalemia 11/14/2011   Thrombocytopenia ([name]) 11/14/2011   Alcohol withdrawal ([name]) 11/13/2011   Bipolar 2 disorder ([name]) 11/13/2011   Tobacco abuse 11/13/2011   Cocaine abuse ([name]) 11/13/2011   Gout 11/13/2011    Orientation RESPIRATION BLADDER Height & Weight     Self, Time, Situation, Place  O2 (1.5 L nasal cannula) Incontinent, External catheter Weight: 233 lb 4.8 oz (105.8  kg) Height:  5\' 7"  (170.2 cm)  BEHAVIORAL SYMPTOMS/MOOD NEUROLOGICAL BOWEL NUTRITION STATUS      Incontinent Diet (see discharge summary)  AMBULATORY STATUS COMMUNICATION OF NEEDS Skin   Extensive Assist Verbally Normal                       Personal Care Assistance Level of Assistance  Bathing, Feeding, Dressing, Total care Bathing Assistance: Limited assistance Feeding assistance: Independent Dressing Assistance: Independent Total Care Assistance: Maximum assistance   Functional Limitations Info  Sight, Speech, Hearing Sight Info: Adequate Hearing Info: Adequate Speech Info: Adequate    SPECIAL CARE FACTORS FREQUENCY  PT (By licensed PT), OT (By licensed OT)     PT Frequency: min 4x weekly OT Frequency: min 4x weekly            Contractures Contractures Info: Not present    Additional Factors Info  Code Status, Allergies Code Status Info: full Allergies Info: erythromycin, sulfa antibiotics, tylenol (actaminophen), Nsaids, Gabapentin           Current Medications (12/22/2020):  This is the current hospital active medication list Current Facility-Administered Medications  Medication Dose Route Frequency Provider Last Rate Last Admin   0.9 %  sodium chloride infusion   Intravenous PRN 02/21/2021, MD   Stopped at 12/18/20 2305   0.9 % NaCl with KCl 20 mEq/ L  infusion   Intravenous Continuous Kolluru, Sarath, MD 50 mL/hr at 12/21/20 1229 New Bag at 12/21/20 1229   albuterol (PROVENTIL) (2.5 MG/3ML) 0.083% nebulizer solution 2.5 mg  2.5 mg Nebulization Q6H PRN 02/20/21, MD   2.5 mg  at 12/19/20 0239   albuterol (VENTOLIN HFA) 108 (90 Base) MCG/ACT inhaler 2 puff  2 puff Inhalation Q6H PRN Swayze, Ava, DO       budesonide (ENTOCORT EC) 24 hr capsule 9 mg  9 mg Oral Daily Swayze, Ava, DO   9 mg at 12/22/20 0941   Chlorhexidine Gluconate Cloth 2 % PADS 6 each  6 each Topical Q0600 Lynn Ito, MD   6 each at 12/22/20 0947   clonazePAM (KLONOPIN) tablet 1  mg  1 mg Oral BID Swayze, Ava, DO   1 mg at 12/22/20 0940   enoxaparin (LOVENOX) injection 45 mg  0.5 mg/kg Subcutaneous Q24H Lindajo Royal V, MD   45 mg at 12/22/20 0944   feeding supplement (ENSURE ENLIVE / ENSURE PLUS) liquid 237 mL  237 mL Oral BID BM Delfino Lovett, MD   237 mL at 12/22/20 0947   FLUoxetine (PROZAC) capsule 80 mg  80 mg Oral Daily Swayze, Ava, DO   80 mg at 12/22/20 0942   fluticasone (FLONASE) 50 MCG/ACT nasal spray 1 spray  1 spray Each Nare Daily Swayze, Ava, DO   1 spray at 12/22/20 0947   folic acid (FOLVITE) tablet 1 mg  1 mg Oral Daily Sherryll Burger, Vipul, MD   1 mg at 12/22/20 0940   insulin aspart (novoLOG) injection 0-20 Units  0-20 Units Subcutaneous TID WC Lindajo Royal V, MD   7 Units at 12/22/20 1315   insulin aspart (novoLOG) injection 0-5 Units  0-5 Units Subcutaneous QHS Andris Baumann, MD       insulin glargine-yfgn [facility]) injection 8 Units  8 Units Subcutaneous QHS Delfino Lovett, MD   8 Units at 12/21/20 2208   lactulose (CHRONULAC) 10 GM/15ML solution 20 g  20 g Oral Daily PRN [name], MD   20 g at 12/19/20 1642   linezolid (ZYVOX) tablet 600 mg  600 mg Oral Q12H Swayze, Ava, DO       methocarbamol (ROBAXIN) tablet 500 mg  500 mg Oral BID PRN Delfino Lovett, MD   500 mg at 12/21/20 0236   multivitamin with minerals tablet 1 tablet  1 tablet Oral Daily Delfino Lovett, MD   1 tablet at 12/22/20 0940   mupirocin ointment (BACTROBAN) 2 % 1 application  1 application Nasal BID Lynn Ito, MD   1 application at 12/22/20 0945   OLANZapine (ZYPREXA) tablet 5 mg  5 mg Oral Daily Swayze, Ava, DO   5 mg at 12/22/20 1315   ondansetron (ZOFRAN) tablet 4 mg  4 mg Oral Q6H PRN Andris Baumann, MD       Or   ondansetron [facility]) injection 4 mg  4 mg Intravenous Q6H PRN Lindajo Royal V, MD   4 mg at 12/19/20 0144   pantoprazole (PROTONIX) injection 40 mg  40 mg Intravenous Q12H Delfino Lovett, MD   40 mg at 12/22/20 0946   pregabalin (LYRICA) capsule 75-150 mg  75-150 mg Oral TID  Swayze, Ava, DO   150 mg at 12/22/20 0940   rifaximin (XIFAXAN) tablet 550 mg  550 mg Oral BID [name], MD   550 mg at 12/22/20 0942   thiamine 500mg  in normal saline (60ml) IVPB  500 mg Intravenous Q8H 45m, RPH 100 mL/hr at 12/22/20 0527 500 mg at 12/22/20 0527   Followed by   02/21/21 ON 12/23/2020] thiamine tablet 100 mg  100 mg Oral Daily 02/22/2021, [facility]  Discharge Medications: Please see discharge summary for a list of discharge medications.  Relevant Imaging Results:  Relevant Lab Results:   Additional Information SSN: [id number]  Gildardo Griffes, LCSW

## 2020-12-22 NOTE — Progress Notes (Signed)
[place] for Electrolyte Monitoring and Replacement   Recent Labs: Potassium (mmol/L)  Date Value  12/22/2020 3.9   Magnesium (mg/dL)  Date Value  12/22/2020 1.8   Calcium (mg/dL)  Date Value  12/22/2020 7.8 (L)   Albumin (g/dL)  Date Value  12/21/2020 <1.5 (L)   Phosphorus (mg/dL)  Date Value  12/22/2020 UNABLE TO REPORT DUE TO ICTERUS   Sodium (mmol/L)  Date Value  12/22/2020 133 (L)     Assessment: 45yo F with PMH Bipolar disorder, diabetes, alcohol use disorder and polysubstance abuse, FSH muscular dystrophy, with history of falls who was admitted to the hospital after she fell at home 2 days prior and was unable to get up. Pt was noted to be jaundiced on admin with AST 283, alk phos 495, total bilirubin 11.9, and ammonia 52.   Current electrolyte abnormalities likely due to refeeding syndrome/poor nutritional status given alcohol abuse  K 3.9>5.2 (hemolysis present)>3.5>3.9 Na 129>129>133>132>133>130>136>133 Ca 7.2>7.3>7.3>7.8 Mg 2.0>1.7>1.7>1.8 10/6 Phos unable to report due to icterus   Pt is receiving NS @ 25m/hr  Goal of Therapy:  Electrolytes WNL  Plan:  -Will continue NS _0 /hr -No additional replacement indicated at this time -Will re-check BMP, Mg and phos with AM labs, continue to monitor electrolytes and replace as indicated   BNarda Rutherford PharmD Pharmacy Resident  12/22/2020 1:23 PM

## 2020-12-22 NOTE — Progress Notes (Signed)
PROGRESS NOTE  [name] YBO:[id number] DOB: [birth year] DOA: 12/17/2020 PCP: Eunice Blase, PA-C  Brief History   [name] is a 45 y.o. female with medical history significant for Bipolar disorder, diabetes, alcohol use disorder and polysubstance abuse, FSH muscular dystrophy, with history of falls, last seen by neurology in January 2020, who was brought in by EMS after she fell at home 2 days prior and was unable to get up.   While in ED, patient was found to have severe hyponatremia of 113, potassium 2.9.   She also has an ammonia of 52, bilirubin of 11.9. She was given potassium, she was also placed on normal saline. Her chest x-ray also showed a right lower lobe infiltrates, probable aspiration pneumonia.  She is started on antibiotics with Unasyn.   The patient remains jaundiced. She appears to be in hepatic encephalopathy, although her parents at bedside stated that she was awake earlier in the day.  The patient had refused MRI abdomen/liver on 10/5. Today she has agreed to it.  Consultants  Gastroenterology Nephrology  Procedures  None  Antibiotics   Anti-infectives (From admission, onward)    Start     Dose/Rate Route Frequency Ordered Stop   12/22/20 2200  linezolid (ZYVOX) tablet 600 mg        600 mg Oral Every 12 hours 12/22/20 1143     12/20/20 2200  vancomycin (VANCOCIN) IVPB 1000 mg/200 mL premix  Status:  Discontinued        1,000 mg 200 mL/hr over 60 Minutes Intravenous Every 12 hours 12/20/20 0839 12/22/20 1143   12/20/20 0930  vancomycin (VANCOCIN) 2,000 mg in sodium chloride 0.9 % 500 mL IVPB  Status:  Discontinued        2,000 mg 250 mL/hr over 120 Minutes Intravenous  Once 12/20/20 0839 12/20/20 0907   12/20/20 0930  vancomycin (VANCOCIN) 2,000 mg in sodium chloride 0.9 % 500 mL IVPB        2,000 mg 250 mL/hr over 120 Minutes Intravenous  Once 12/20/20 0907 12/20/20 1223   12/19/20 1630  rifaximin (XIFAXAN) tablet 550 mg        550 mg Oral 2 times  daily 12/19/20 1533     12/18/20 1200  Ampicillin-Sulbactam (UNASYN) 3 g in sodium chloride 0.9 % 100 mL IVPB  Status:  Discontinued        3 g 200 mL/hr over 30 Minutes Intravenous Every 6 hours 12/18/20 1038 12/20/20 0839   12/18/20 0045  Ampicillin-Sulbactam (UNASYN) 3 g in sodium chloride 0.9 % 100 mL IVPB        3 g 200 mL/hr over 30 Minutes Intravenous  Once 12/18/20 0033 12/18/20 0303      Subjective  The patient is again lethargic, but less so than yesterday. She will open her eyes and answer questions today. Family is at bedside.  Objective   Vitals:  Vitals:   12/22/20 0825 12/22/20 1257  BP: 112/74 109/75  Pulse: (!) 106 100  Resp: 14 19  Temp: 97.8 F (36.6 C) 98.5 F (36.9 C)  SpO2: 96% 96%    Exam:  Constitutional:  The patient is lethargic, but more responsive than yesterday.  Eyes:  pupils and irises appear normal Sclera are icteric Respiratory:  CTA bilaterally, no w/r/r.  Respiratory effort normal. No retractions or accessory muscle use Cardiovascular:  RRR, no m/r/g No LE extremity edema   Normal pedal pulses Abdomen:  Abdomen appears normal; no tenderness or masses No hernias  No HSM Musculoskeletal:  Digits/nails BUE: no clubbing, cyanosis, petechiae, infection exam of joints, bones, muscles of at least one of following: head/neck, RUE, LUE, RLE, LLE   Skin:  No rashes, lesions, ulcers palpation of skin: no induration or nodules Less jaundiced than yesterday Neurologic:  Unable to evaluate due to the patient's inability to cooperate with exam. Psychiatric:  Unable to evaluate due to the patient's inability to cooperate with exam.   I have personally reviewed the following:   Today's Data  Vitals  Lab Data  BMP, CBC   Micro Data  Blood culture x 2 No growth  Imaging  CT head US abdomen  Cardiology Data  EKG  Scheduled Meds:  budesonide  9 mg Oral Daily   Chlorhexidine Gluconate Cloth  6 each Topical Q0600   clonazePAM   1 mg Oral BID   enoxaparin (LOVENOX) injection  0.5 mg/kg Subcutaneous Q24H   feeding supplement  237 mL Oral BID BM   FLUoxetine  80 mg Oral Daily   fluticasone  1 spray Each Nare Daily   folic acid  1 mg Oral Daily   insulin aspart  0-20 Units Subcutaneous TID WC   insulin aspart  0-5 Units Subcutaneous QHS   insulin glargine-yfgn  8 Units Subcutaneous QHS   linezolid  600 mg Oral Q12H   multivitamin with minerals  1 tablet Oral Daily   mupirocin ointment  1 application Nasal BID   OLANZapine  5 mg Oral Daily   pantoprazole (PROTONIX) IV  40 mg Intravenous Q12H   pregabalin  75-150 mg Oral TID   rifaximin  550 mg Oral BID   [START ON 12/23/2020] thiamine  100 mg Oral Daily   Continuous Infusions:  sodium chloride Stopped (12/18/20 2305)   0.9 % NaCl with KCl 20 mEq / L 50 mL/hr at 12/22/20 1647    Principal Problem:   Sepsis ([name]) Active Problems:   Alcohol withdrawal ([name])   Bipolar 2 disorder ([name])   Hypokalemia   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[name])   Acute hyponatremia   Alcohol use disorder, moderate, dependence ([name])   Hepatic steatosis   Polysubstance abuse ([name])   Hyperglycemia due to type 2 diabetes mellitus ([name])   Hyperbilirubinemia   Lactic acidosis   Alcoholic liver disease ([name])   Acute hepatic encephalopathy   LOS: 4 days   A & P  Sepsis ([name]) Due to aspiration pneumonia, on IV vanco. MRSA screen +. Will convert to linezolid for dc.   Acute hepatic encephalopathy Still quite encephalopathic. Increase dose of lactulose.Continue Xifaxan.  Dietary consult   Hyperbilirubinemia GI following.  Pending hepatitis panel, likely due to ischemic hepatitis.  Normal doppler liver.  We will get MRCP for further evaluation. Jaundice improving.   Hepatic steatosis Needs to stop alcohol.  Pending hepatitis panel. Mother wants for her to go for inpatient alcohol rehab again.   Alcohol use disorder, moderate, dependence ([name]) Patient is hoping to  quit drinking. She will need inpatient rehab.   Acute hyponatremia Resolved. Monitor.   Hypokalemia Repleted and resolved   Alcohol withdrawal ([name]) CIWA protocol   Euthyroid sick syndrome Elevated TSH, normal free T4  I have seen and examined this patient myself. I have spent 34 minutes in her evaluation and care.  DVT prophylaxis: Lovenox Code Status: Full Code Family Communication: Discussed patient in detail with family at bedside. All questions answered to the best of my ability. Disposition Plan: tbd    Ezmeralda Stefanick, DO [facility] Direct contact: see [email or web address]  7PM-7AM contact night coverage as above 12/22/2020, 6:50 PM  LOS: 3 days

## 2020-12-22 NOTE — Progress Notes (Signed)
Occupational Therapy Treatment Patient Details Name: [name] MRN: [id number] DOB: 04-11-1975 Today's Date: 12/22/2020   History of present illness 45 y.o. female with medical history significant for Bipolar disorder, diabetes, alcohol use disorder and polysubstance abuse, FSH muscular dystrophy, with history of frequent falls, last seen by neurology in January 2020, who was brought in by EMS after she fell at home 2 days prior and was unable to get up.   OT comments  Upon entering the room, pt supine in bed and sleeping soundly in bed. Pt does not open eyes during session she only grunts and groans in bed. Pt placed in chair position in bed and use of wash cloth to attempt to alert her further. OT notes pt's B UEs with increased edema. PROM in all planes of movement. Total A to reposition pt in bed. Pt not making progress this session towards goals. OT to re-assess next treatment to see if pt is able to progress with therapy and actively participate in OT intervention.    Recommendations for follow up therapy are one component of a multi-disciplinary discharge planning process, led by the attending physician.  Recommendations may be updated based on patient status, additional functional criteria and insurance authorization.    Follow Up Recommendations  SNF    Equipment Recommendations  3 in 1 bedside commode       Precautions / Restrictions Precautions Precautions: Fall Precaution Comments: watch HR       Mobility Bed Mobility Overal bed mobility: Needs Assistance Bed Mobility: Rolling Rolling: Total assist         General bed mobility comments: total A- pt gives no effort and remains lethargic    Transfers                 General transfer comment: deferred for safety        ADL either performed or assessed with clinical judgement   ADL Overall ADL's : Needs assistance/impaired     Grooming: Wash/dry face;Wash/dry hands;Total assistance Grooming Details  (indicate cue type and reason): hand over hand assist                                     Vision Patient Visual Report: No change from baseline            Cognition Arousal/Alertness: Awake/alert Behavior During Therapy: Flat affect Overall Cognitive Status: Difficult to assess                                 General Comments: Pt very lethargic with eyes closed throughout. She does not follow any commands.                   Pertinent Vitals/ Pain       Pain Assessment: Faces Faces Pain Scale: Hurts little more Pain Location: generalized Pain Descriptors / Indicators: Discomfort;Grimacing;Guarding Pain Intervention(s): Limited activity within patient's tolerance;Monitored during session;Repositioned         Frequency  Min 2X/week        Progress Toward Goals  OT Goals(current goals can now be found in the care plan section)  Progress towards OT goals: OT to reassess next treatment  Acute Rehab OT Goals Patient Stated Goal: none stated OT Goal Formulation: Patient unable to participate in goal setting Time For Goal Achievement: 01/02/21 Potential to Achieve Goals:  Fair  Plan Discharge plan remains appropriate       AM-PAC OT "6 Clicks" Daily Activity     Outcome Measure   Help from another person eating meals?: Total Help from another person taking care of personal grooming?: Total Help from another person toileting, which includes using toliet, bedpan, or urinal?: Total Help from another person bathing (including washing, rinsing, drying)?: Total Help from another person to put on and taking off regular upper body clothing?: Total Help from another person to put on and taking off regular lower body clothing?: Total 6 Click Score: 6    End of Session Equipment Utilized During Treatment: Oxygen (2Ls)  OT Visit Diagnosis: Unsteadiness on feet (R26.81);Muscle weakness (generalized) (M62.81);History of falling (Z91.81)    Activity Tolerance Patient limited by fatigue;Patient limited by lethargy   Patient Left in bed;with call bell/phone within reach;with bed alarm set   Nurse Communication Mobility status;Other (comment) (lethargy)        Time: 5449-2010 OT Time Calculation (min): 17 min  Charges: OT General Charges $OT Visit: 1 Visit OT Treatments $Therapeutic Activity: 8-22 mins  Jackquline Denmark, MS, OTR/L , CBIS ascom [phone]  12/22/20, 3:26 PM

## 2020-12-23 ENCOUNTER — Inpatient Hospital Stay: Payer: Medicare Other

## 2020-12-23 ENCOUNTER — Other Ambulatory Visit (HOSPITAL_COMMUNITY): Payer: Self-pay

## 2020-12-23 DIAGNOSIS — R17 Unspecified jaundice: Secondary | ICD-10-CM

## 2020-12-23 DIAGNOSIS — E871 Hypo-osmolality and hyponatremia: Secondary | ICD-10-CM | POA: Diagnosis not present

## 2020-12-23 DIAGNOSIS — K7010 Alcoholic hepatitis without ascites: Secondary | ICD-10-CM | POA: Diagnosis not present

## 2020-12-23 DIAGNOSIS — A419 Sepsis, unspecified organism: Secondary | ICD-10-CM | POA: Diagnosis not present

## 2020-12-23 DIAGNOSIS — R7989 Other specified abnormal findings of blood chemistry: Secondary | ICD-10-CM | POA: Diagnosis not present

## 2020-12-23 DIAGNOSIS — F1020 Alcohol dependence, uncomplicated: Secondary | ICD-10-CM | POA: Diagnosis not present

## 2020-12-23 DIAGNOSIS — K7682 Hepatic encephalopathy: Secondary | ICD-10-CM | POA: Diagnosis not present

## 2020-12-23 LAB — CBC WITH DIFFERENTIAL/PLATELET
Abs Immature Granulocytes: 0.4 10*3/uL — ABNORMAL HIGH (ref 0.00–0.07)
Basophils Absolute: 0.1 10*3/uL (ref 0.0–0.1)
Basophils Relative: 0 %
Eosinophils Absolute: 0.1 10*3/uL (ref 0.0–0.5)
Eosinophils Relative: 0 %
HCT: 32.3 % — ABNORMAL LOW (ref 36.0–46.0)
Hemoglobin: 9.8 g/dL — ABNORMAL LOW (ref 12.0–15.0)
Immature Granulocytes: 2 %
Lymphocytes Relative: 14 %
Lymphs Abs: 2.2 10*3/uL (ref 0.7–4.0)
MCH: 33.9 pg (ref 26.0–34.0)
MCHC: 30.3 g/dL (ref 30.0–36.0)
MCV: 111.8 fL — ABNORMAL HIGH (ref 80.0–100.0)
Monocytes Absolute: 1.3 10*3/uL — ABNORMAL HIGH (ref 0.1–1.0)
Monocytes Relative: 8 %
Neutro Abs: 12.3 10*3/uL — ABNORMAL HIGH (ref 1.7–7.7)
Neutrophils Relative %: 76 %
Platelets: 136 10*3/uL — ABNORMAL LOW (ref 150–400)
RBC: 2.89 MIL/uL — ABNORMAL LOW (ref 3.87–5.11)
RDW: 25.1 % — ABNORMAL HIGH (ref 11.5–15.5)
Smear Review: NORMAL
WBC: 16.4 10*3/uL — ABNORMAL HIGH (ref 4.0–10.5)
nRBC: 1.4 % — ABNORMAL HIGH (ref 0.0–0.2)

## 2020-12-23 LAB — COMPREHENSIVE METABOLIC PANEL
ALT: 29 U/L (ref 0–44)
AST: 183 U/L — ABNORMAL HIGH (ref 15–41)
Albumin: 1.6 g/dL — ABNORMAL LOW (ref 3.5–5.0)
Alkaline Phosphatase: 307 U/L — ABNORMAL HIGH (ref 38–126)
Anion gap: 10 (ref 5–15)
BUN: 11 mg/dL (ref 6–20)
CO2: 34 mmol/L — ABNORMAL HIGH (ref 22–32)
Calcium: 8.2 mg/dL — ABNORMAL LOW (ref 8.9–10.3)
Chloride: 90 mmol/L — ABNORMAL LOW (ref 98–111)
Creatinine, Ser: <0.3 mg/dL — ABNORMAL LOW (ref 0.44–1.00)
Glucose, Bld: 199 mg/dL — ABNORMAL HIGH (ref 70–99)
Potassium: 4.5 mmol/L (ref 3.5–5.1)
Sodium: 134 mmol/L — ABNORMAL LOW (ref 135–145)
Total Bilirubin: 16.9 mg/dL — ABNORMAL HIGH (ref 0.3–1.2)
Total Protein: 6.4 g/dL — ABNORMAL LOW (ref 6.5–8.1)

## 2020-12-23 LAB — GLUCOSE, CAPILLARY
Glucose-Capillary: 187 mg/dL — ABNORMAL HIGH (ref 70–99)
Glucose-Capillary: 214 mg/dL — ABNORMAL HIGH (ref 70–99)
Glucose-Capillary: 223 mg/dL — ABNORMAL HIGH (ref 70–99)
Glucose-Capillary: 159 mg/dL — ABNORMAL HIGH (ref 70–99)

## 2020-12-23 LAB — HSV DNA BY PCR (REFERENCE LAB)
HSV 1 DNA: NEGATIVE
HSV 2 DNA: NEGATIVE

## 2020-12-23 LAB — CULTURE, BLOOD (ROUTINE X 2)
Culture: NO GROWTH
Culture: NO GROWTH
Special Requests: ADEQUATE

## 2020-12-23 LAB — MAGNESIUM: Magnesium: 1.8 mg/dL (ref 1.7–2.4)

## 2020-12-23 LAB — SODIUM
Sodium: 136 mmol/L (ref 135–145)
Sodium: 134 mmol/L — ABNORMAL LOW (ref 135–145)
Sodium: 134 mmol/L — ABNORMAL LOW (ref 135–145)

## 2020-12-23 LAB — HEPATITIS B SURFACE ANTIGEN: Hepatitis B Surface Ag: NONREACTIVE

## 2020-12-23 LAB — PHOSPHORUS: Phosphorus: UNDETERMINED mg/dL (ref 2.5–4.6)

## 2020-12-23 MED ORDER — LORAZEPAM 0.5 MG PO TABS
0.5000 mg | ORAL_TABLET | Freq: Once | ORAL | Status: AC
  Administered 2020-12-23: 0.5 mg via ORAL
  Filled 2020-12-23: qty 1

## 2020-12-23 MED ORDER — GADOBUTROL 1 MMOL/ML IV SOLN
10.0000 mL | Freq: Once | INTRAVENOUS | Status: AC | PRN
  Administered 2020-12-23: 10 mL via INTRAVENOUS

## 2020-12-23 NOTE — Progress Notes (Signed)
[facility] for Electrolyte Monitoring and Replacement   Recent Labs: Potassium (mmol/L)  Date Value  12/23/2020 4.5   Magnesium (mg/dL)  Date Value  12/23/2020 1.8   Calcium (mg/dL)  Date Value  12/23/2020 8.2 (L)   Albumin (g/dL)  Date Value  12/23/2020 1.6 (L)   Phosphorus (mg/dL)  Date Value  12/23/2020 UNABLE TO REPORT DUE TO ICTERUS   Sodium (mmol/L)  Date Value  12/23/2020 134 (L)     Assessment: 45yo F with PMH Bipolar disorder, diabetes, alcohol use disorder and polysubstance abuse, FSH muscular dystrophy, with history of falls who was admitted to the hospital after she fell at home 2 days prior and was unable to get up. Pt was noted to be jaundiced on admin with AST 283, alk phos 495, total bilirubin 11.9, and ammonia 52.   Current electrolyte abnormalities likely due to refeeding syndrome/poor nutritional status given alcohol abuse  K 3.9>5.2 (hemolysis present)>3.5>3.9>4.5 Na 130>136>133>134 Ca 7.2>7.3>7.3>7.8>8.2 Mg 2.0>1.7>1.7>1.8>1.8 10/7 Phos unable to report due to icterus   Pt is receiving NS @ 23m/hr  Goal of Therapy:  Electrolytes WNL  Plan:  -Will continue NS _0 /hr -No additional replacement indicated at this time -Will re-check BMP, Mg and phos with AM labs, continue to monitor electrolytes and replace as indicated   BNarda Rutherford PharmD Pharmacy Resident  12/23/2020 1:55 PM

## 2020-12-23 NOTE — Plan of Care (Signed)
  Problem: Elimination: Goal: Will not experience complications related to bowel motility Outcome: Not Progressing Goal: Will not experience complications related to urinary retention Outcome: Not Progressing   Problem: Education: Goal: Knowledge of General Education information will improve Description: Including pain rating scale, medication(s)/side effects and non-pharmacologic comfort measures Outcome: Not Progressing   Problem: Health Behavior/Discharge Planning: Goal: Ability to manage health-related needs will improve Outcome: Not Progressing  Able to wake pt up last night and assisted with her super, she ate 50% of meal, drinking well when offered fluids. VSS. MRI pending this am.

## 2020-12-23 NOTE — Progress Notes (Signed)
[name] , MD [address], [address], [place], [place], [address] [address], [place], [place], [address] Phone: [phone]  Fax: [phone]   [name] is being followed for alcoholic hepatitis    Subjective: No complaints    Objective: Vital signs in last 24 hours: Vitals:   12/22/20 2015 12/23/20 0029 12/23/20 0100 12/23/20 0353  BP: 116/81 119/79  119/90  Pulse: 95 98  100  Resp: 18 18 14 20   Temp: 97.8 F (36.6 C) 98 F (36.7 C)  98.2 F (36.8 C)  TempSrc:      SpO2: 96% 97% 98% 98%  Weight:      Height:       Weight change:   Intake/Output Summary (Last 24 hours) at 12/23/2020 1146 Last data filed at 12/23/2020 0915 Gross per 24 hour  Intake 3068.06 ml  Output 650 ml  Net 2418.06 ml     Exam:  Abdomen: soft, nontender, normal bowel sounds Appears icteric, very drowsy from ativan she just received.   Lab Results: @LABTEST2 @ Micro Results: Recent Results (from the past 240 hour(s))  Resp Panel by RT-PCR (Flu A&B, Covid) Nasopharyngeal Swab     Status: None   Collection Time: 12/18/20  1:16 AM   Specimen: Nasopharyngeal Swab; Nasopharyngeal(NP) swabs in vial transport medium  Result Value Ref Range Status   SARS Coronavirus 2 by RT PCR NEGATIVE NEGATIVE Final    Comment: (NOTE) SARS-CoV-2 target nucleic acids are NOT DETECTED.  The SARS-CoV-2 RNA is generally detectable in upper respiratory specimens during the acute phase of infection. The lowest concentration of SARS-CoV-2 viral copies this assay can detect is 138 copies/mL. A negative result does not preclude SARS-Cov-2 infection and should not be used as the sole basis for treatment or other patient management decisions. A negative result may occur with  improper specimen collection/handling, submission of specimen other than nasopharyngeal swab, presence of viral mutation(s) within the areas targeted by this assay, and inadequate number of viral copies(<138 copies/mL).  A negative result must be combined with clinical observations, patient history, and epidemiological information. The expected result is Negative.  Fact Sheet for Patients:   Fact Sheet for Healthcare Providers:  02/17/21  This test is no t yet approved or cleared by the [employer] FDA and  has been authorized for detection and/or diagnosis of SARS-CoV-2 by FDA under an Emergency Use Authorization (EUA). This EUA will remain  in effect (meaning this test can be used) for the duration of the COVID-19 declaration under Section 564(b)(1) of the Act, 21 U.S.C.section 360bbb-3(b)(1), unless the authorization is terminated  or revoked sooner.       Influenza A by PCR NEGATIVE NEGATIVE Final   Influenza B by PCR NEGATIVE NEGATIVE Final    Comment: (NOTE) The Xpert Xpress SARS-CoV-2/FLU/RSV plus assay is intended as an aid in the diagnosis of influenza from Nasopharyngeal swab specimens and should not be used as a sole basis for treatment. Nasal washings and aspirates are unacceptable for Xpert Xpress SARS-CoV-2/FLU/RSV testing.  Fact Sheet for Patients: [employer]iousBroker.it  Fact Sheet for Healthcare Providers: [place]  This test is not yet approved or cleared by the [employer] FDA and has been authorized for detection and/or diagnosis of SARS-CoV-2 by FDA under an Emergency Use Authorization (EUA). This EUA will remain in effect (meaning this test can be used) for the duration of the COVID-19 declaration under Section 564(b)(1) of the Act, 21 U.S.C. section 360bbb-3(b)(1), unless the authorization  is terminated or revoked.  Performed at [facility], [address] [place], [place] [address]   Blood culture (routine x 2)     Status: None   Collection Time: 12/18/20  1:16 AM   Specimen: BLOOD  Result Value Ref Range Status    Specimen Description BLOOD RIGHT HAND  Final   Special Requests   Final    BOTTLES DRAWN AEROBIC AND ANAEROBIC Blood Culture results may not be optimal due to an inadequate volume of blood received in culture bottles   Culture   Final    NO GROWTH 5 DAYS Performed at [facility], [address], [place], [place] [address]    Report Status 12/23/2020 FINAL  Final  Blood culture (routine x 2)     Status: None   Collection Time: 12/18/20  1:16 AM   Specimen: BLOOD  Result Value Ref Range Status   Specimen Description BLOOD RIGHT ASSIST CONTROL  Final   Special Requests   Final    BOTTLES DRAWN AEROBIC AND ANAEROBIC Blood Culture adequate volume   Culture   Final    NO GROWTH 5 DAYS Performed at [facility], [address] [place], [place] [address]    Report Status 12/23/2020 FINAL  Final  MRSA Next Gen by PCR, Nasal     Status: Abnormal   Collection Time: 12/19/20  4:04 PM   Specimen: Nasal Mucosa; Nasal Swab  Result Value Ref Range Status   MRSA by PCR Next Gen DETECTED (A) NOT DETECTED Final    Comment: RESULT CALLED TO, READ BACK BY AND VERIFIED WITH: AMY DOLTON @1830  ON 12/19/20 SKL (NOTE) The GeneXpert MRSA Assay (FDA approved for NASAL specimens only), is one component of a comprehensive MRSA colonization surveillance program. It is not intended to diagnose MRSA infection nor to guide or monitor treatment for MRSA infections. Test performance is not FDA approved in patients less than 48 years old. Performed at [facility], [address], [place], [place]    Studies/Results: No results found. Medications: I have reviewed the patient's current medications. Scheduled Meds:  budesonide  9 mg Oral Daily   Chlorhexidine Gluconate Cloth  6 each Topical Q0600   enoxaparin (LOVENOX) injection  0.5 mg/kg Subcutaneous Q24H   feeding supplement  237 mL Oral BID BM   FLUoxetine  80 mg Oral Daily   fluticasone  1 spray Each Nare Daily    folic acid  1 mg Oral Daily   insulin aspart  0-20 Units Subcutaneous TID WC   insulin aspart  0-5 Units Subcutaneous QHS   insulin glargine-yfgn  8 Units Subcutaneous QHS   lactulose  20 g Oral TID   linezolid  600 mg Oral Q12H   multivitamin with minerals  1 tablet Oral Daily   mupirocin ointment  1 application Nasal BID   OLANZapine  5 mg Oral Daily   pantoprazole (PROTONIX) IV  40 mg Intravenous Q12H   pregabalin  75-150 mg Oral TID   rifaximin  550 mg Oral BID   thiamine  100 mg Oral Daily   Continuous Infusions:  sodium chloride Stopped (12/18/20 2305)   0.9 % NaCl with KCl 20 mEq / L 50 mL/hr at 12/23/20 0733   PRN Meds:.sodium chloride, albuterol, albuterol, methocarbamol, ondansetron **OR** ondansetron (ZOFRAN) IV   Assessment: Principal Problem:   Sepsis ([name]) Active Problems:   Alcohol withdrawal ([name])   Bipolar 2 disorder ([name])   Hypokalemia   FSHD (facioscapulohumeral  muscular dystrophy) ([name])   Acute hyponatremia   Alcohol use disorder, moderate, dependence ([name])   Hepatic steatosis   Polysubstance abuse ([name])   Hyperglycemia due to type 2 diabetes mellitus ([name])   Hyperbilirubinemia   Lactic acidosis   Alcoholic liver disease ([name])   Acute hepatic encephalopathy  [name] 45 y.o. female admitted with a fall, pneumonia, hypovolemia, I am following for abnormal Lft's likely  alcoholic hepatitis .  Marland Kitchen She also has hepatic encephelopathy , cocaine in urine. Likely has chronic alcoholic liver disease/cirrhosis. Doppler USG shows no obvious thrombosis. Hep B e Ag is positive with negative viral load ? False positive.    Plan: Hepatic encephlopathy : continue lactulose , xifaxan,  Check hepatitis B surface antigen and will plan to repeat hep B viral load and E antigen in 3 days ? False positive.  Abstain in the future from all alcohol.   Follow up MRCP report .     LOS: 5 days   [name], MD 12/23/2020, 11:46 AM

## 2020-12-23 NOTE — Progress Notes (Signed)
  [facility]  ROUNDING NOTE   Subjective:   [name] is a 45 year old Caucasian female with a past medical history of bipolar disorder, diabetes mellitus, alcohol abuse, polysubstance abuse, muscular dystrophy who was brought to the ER with chief complaint of fall around 2 to 3 days back.  Sodium 134   Assessment/ Plan:  [name] is a 45 y.o.  female with a past medical history of bipolar disorder, diabetes mellitus, alcohol abuse, polysubstance abuse, muscular dystrophy who was brought to the ER with chief complaint of fall around 2 to 3 days back.    Severe hyponatremia believed due to hypovolemia due to patient statements of not eating of 2 days while laying on floor. Also believed to have low osmolar intake resulting in BUN <5. Goal correction of 8 mEq /24 hrs. Sodium level of 113 on admission. Tolvaptan held due to liver failure. Hypertonic solution held due to improvement with patient presentation. Sodium at 134. IVF with potassium supplementation until nutrition improves.   Sepsis likely due to aspiration pneumonia. Chest X-ray show right lower lobe infiltrates. Linezolid ordered twice a day  Alcoholic liver disease with elevated total bilirubin and ammonia level. CIWA protocol in place.   Hypokalemia - Current level 4.5. IVF 0.9% NS with 20 Kcl at 48ml/hr. Will defer management of this to primary team  Due to stable sodium level, we will sign off at this time. Feel free to contact us with any concerns.    LOS: 5   10/7/202212:05 PM

## 2020-12-23 NOTE — Care Management Important Message (Signed)
Important Message  Patient Details  Name: [name] MRN: [id number] Date of Birth: [birth year]   Medicare Important Message Given:  Yes  Patient out room upon visit.  No family in room.  Copy of Medicare IM left on bedside tray for reference.    [name] 12/23/2020, 12:01 PM

## 2020-12-23 NOTE — Progress Notes (Signed)
PT Cancellation Note  Patient Details Name: [name] MRN: [id number] DOB: [birth year]   Cancelled Treatment:    Reason Eval/Treat Not Completed: Patient at procedure or test/unavailable. Pt chart reviewed, PT spoke to RN. RN states pt is scheduled for MRI in 15 minutes. Due to heavy assist requiring +2, pt will require increased time for treatment sessions. Will re-attempt at later time/date as time permits.    Basilia Jumbo PT, DPT 12/23/20 10:05 AM [phone]

## 2020-12-23 NOTE — Progress Notes (Signed)
PROGRESS NOTE  [name] YOV:[id number] DOB: 01/21/1976 DOA: 12/17/2020 PCP: Eunice Blase, PA-C  Brief History   [name] is a 45 y.o. female with medical history significant for Bipolar disorder, diabetes, alcohol use disorder and polysubstance abuse, FSH muscular dystrophy, with history of falls, last seen by neurology in January 2020, who was brought in by EMS after she fell at home 2 days prior and was unable to get up.   While in ED, patient was found to have severe hyponatremia of 113, potassium 2.9.   She also has an ammonia of 52, bilirubin of 11.9. She was given potassium, she was also placed on normal saline. Her chest x-ray also showed a right lower lobe infiltrates, probable aspiration pneumonia.  She is started on antibiotics with Unasyn.   The patient remains jaundiced. She appears to be in hepatic encephalopathy, although her parents at bedside stated that she was awake earlier in the day.  The patient underwent MRI abdomen/liver. No ductal dilatation or obstruction seen. Marked hepatomegaly and steatosis. No evidence of portal hypertension. No acute biliary process. It also demonstrated basilar effusions and bibasilar airspace disease right greater than left.  Consultants  Gastroenterology Nephrology  Procedures  None  Antibiotics   Anti-infectives (From admission, onward)    Start     Dose/Rate Route Frequency Ordered Stop   12/22/20 2200  linezolid (ZYVOX) tablet 600 mg        600 mg Oral Every 12 hours 12/22/20 1143     12/20/20 2200  vancomycin (VANCOCIN) IVPB 1000 mg/200 mL premix  Status:  Discontinued        1,000 mg 200 mL/hr over 60 Minutes Intravenous Every 12 hours 12/20/20 0839 12/22/20 1143   12/20/20 0930  vancomycin (VANCOCIN) 2,000 mg in sodium chloride 0.9 % 500 mL IVPB  Status:  Discontinued        2,000 mg 250 mL/hr over 120 Minutes Intravenous  Once 12/20/20 0839 12/20/20 0907   12/20/20 0930  vancomycin (VANCOCIN) 2,000 mg in sodium  chloride 0.9 % 500 mL IVPB        2,000 mg 250 mL/hr over 120 Minutes Intravenous  Once 12/20/20 0907 12/20/20 1223   12/19/20 1630  rifaximin (XIFAXAN) tablet 550 mg        550 mg Oral 2 times daily 12/19/20 1533     12/18/20 1200  Ampicillin-Sulbactam (UNASYN) 3 g in sodium chloride 0.9 % 100 mL IVPB  Status:  Discontinued        3 g 200 mL/hr over 30 Minutes Intravenous Every 6 hours 12/18/20 1038 12/20/20 0839   12/18/20 0045  Ampicillin-Sulbactam (UNASYN) 3 g in sodium chloride 0.9 % 100 mL IVPB        3 g 200 mL/hr over 30 Minutes Intravenous  Once 12/18/20 0033 12/18/20 0303      Subjective  The patient is remains lethargic.  Objective   Vitals:  Vitals:   12/23/20 0353 12/23/20 1204  BP: 119/90 100/68  Pulse: 100 (!) 110  Resp: 20 18  Temp: 98.2 F (36.8 C) 98.1 F (36.7 C)  SpO2: 98% 95%    Exam:  Constitutional:  The patient is remains lethargic. No acute distress. Eyes:  pupils and irises appear normal Sclera are icteric Respiratory:  CTA bilaterally, no w/r/r.  Respiratory effort normal. No retractions or accessory muscle use Cardiovascular:  RRR, no m/r/g No LE extremity edema   Normal pedal pulses Abdomen:  Abdomen appears normal; no tenderness or  masses No hernias No HSM Musculoskeletal:  Digits/nails BUE: no clubbing, cyanosis, petechiae, infection exam of joints, bones, muscles of at least one of following: head/neck, RUE, LUE, RLE, LLE   Skin:  No rashes, lesions, ulcers palpation of skin: no induration or nodules Less jaundiced than yesterday Neurologic:  Unable to evaluate due to the patient's inability to cooperate with exam. Psychiatric:  Unable to evaluate due to the patient's inability to cooperate with exam.   I have personally reviewed the following:   Today's Data  Vitals  Lab Data  BMP  Micro Data  Blood culture x 2 No growth MRSA by PCR is positive  Imaging  CT head US abdomen MRI liver/abdomen  Cardiology  Data  EKG  Scheduled Meds:  budesonide  9 mg Oral Daily   Chlorhexidine Gluconate Cloth  6 each Topical Q0600   enoxaparin (LOVENOX) injection  0.5 mg/kg Subcutaneous Q24H   feeding supplement  237 mL Oral BID BM   FLUoxetine  80 mg Oral Daily   fluticasone  1 spray Each Nare Daily   folic acid  1 mg Oral Daily   insulin aspart  0-20 Units Subcutaneous TID WC   insulin aspart  0-5 Units Subcutaneous QHS   insulin glargine-yfgn  8 Units Subcutaneous QHS   lactulose  20 g Oral TID   linezolid  600 mg Oral Q12H   multivitamin with minerals  1 tablet Oral Daily   mupirocin ointment  1 application Nasal BID   OLANZapine  5 mg Oral Daily   pantoprazole (PROTONIX) IV  40 mg Intravenous Q12H   pregabalin  75-150 mg Oral TID   rifaximin  550 mg Oral BID   thiamine  100 mg Oral Daily   Continuous Infusions:  sodium chloride Stopped (12/18/20 2305)   0.9 % NaCl with KCl 20 mEq / L 50 mL/hr at 12/23/20 1739    Principal Problem:   Sepsis ([name]) Active Problems:   Alcohol withdrawal ([name])   Bipolar 2 disorder ([name])   Hypokalemia   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[name])   Acute hyponatremia   Alcohol use disorder, moderate, dependence ([name])   Hepatic steatosis   Polysubstance abuse ([name])   Hyperglycemia due to type 2 diabetes mellitus ([name])   Hyperbilirubinemia   Lactic acidosis   Alcoholic liver disease ([name])   Acute hepatic encephalopathy   LOS: 5 days   A & P  Sepsis ([name]) Due to aspiration pneumonia, on IV vanco. MRSA screen +. Will convert to linezolid for dc.   Acute hepatic encephalopathy Still quite encephalopathic, but patient did receive ativan for MRI today. She is now receiving an increased dose of lactulose. Continue Xifaxan.  Dietary consult   Hyperbilirubinemia GI following.  Pending hepatitis panel, likely due to ischemic hepatitis.  Normal doppler liver.  MRI abdomen obtained. No biliary obstruction/distention seen.   Hepatic  steatosis/hepatomegaly Needs to stop alcohol. Mother wants for her to go for inpatient alcohol rehab again. I have discussed this with TOC.   Alcohol use disorder, moderate, dependence/Poly substance abuse([name]) Patient is hoping to quit drinking. She will need inpatient rehab.   Acute hyponatremia Resolved. Monitor.   Hyperkalemia IV NS with 20 mEq K discontinued. Start NS at 75 cc/hr   Alcohol withdrawal ([name]) CIWA protocol   Euthyroid sick syndrome Elevated TSH, normal free T4  I have seen and examined this patient myself. I have spent 35 minutes in her evaluation and care.  DVT prophylaxis: Lovenox Code Status: Full Code  Family Communication: None available Disposition Plan: tbd    Trinetta Alemu, DO [facility] Direct contact: see [email or web address]  7PM-7AM contact night coverage as above 12/23/2020, 7:34 PM  LOS: 3 days

## 2020-12-24 DIAGNOSIS — F1020 Alcohol dependence, uncomplicated: Secondary | ICD-10-CM | POA: Diagnosis not present

## 2020-12-24 DIAGNOSIS — K7682 Hepatic encephalopathy: Secondary | ICD-10-CM | POA: Diagnosis not present

## 2020-12-24 DIAGNOSIS — A419 Sepsis, unspecified organism: Secondary | ICD-10-CM | POA: Diagnosis not present

## 2020-12-24 DIAGNOSIS — E871 Hypo-osmolality and hyponatremia: Secondary | ICD-10-CM | POA: Diagnosis not present

## 2020-12-24 DIAGNOSIS — L8990 Pressure ulcer of unspecified site, unspecified stage: Secondary | ICD-10-CM | POA: Insufficient documentation

## 2020-12-24 LAB — BASIC METABOLIC PANEL
Anion gap: 8 (ref 5–15)
BUN: 14 mg/dL (ref 6–20)
CO2: 28 mmol/L (ref 22–32)
Calcium: 8.2 mg/dL — ABNORMAL LOW (ref 8.9–10.3)
Chloride: 97 mmol/L — ABNORMAL LOW (ref 98–111)
Creatinine, Ser: 0.51 mg/dL (ref 0.44–1.00)
GFR, Estimated: >60 mL/min (ref >60)
Glucose, Bld: 178 mg/dL — ABNORMAL HIGH (ref 70–99)
Potassium: 6.1 mmol/L — ABNORMAL HIGH (ref 3.5–5.1)
Sodium: 133 mmol/L — ABNORMAL LOW (ref 135–145)

## 2020-12-24 LAB — VARICELLA-ZOSTER BY PCR: Varicella-Zoster, PCR: NEGATIVE

## 2020-12-24 LAB — SODIUM
Sodium: 134 mmol/L — ABNORMAL LOW (ref 135–145)
Sodium: 133 mmol/L — ABNORMAL LOW (ref 135–145)
Sodium: 135 mmol/L (ref 135–145)

## 2020-12-24 LAB — PHOSPHORUS

## 2020-12-24 LAB — GLUCOSE, CAPILLARY
Glucose-Capillary: 175 mg/dL — ABNORMAL HIGH (ref 70–99)
Glucose-Capillary: 208 mg/dL — ABNORMAL HIGH (ref 70–99)
Glucose-Capillary: 256 mg/dL — ABNORMAL HIGH (ref 70–99)
Glucose-Capillary: 224 mg/dL — ABNORMAL HIGH (ref 70–99)

## 2020-12-24 LAB — MAGNESIUM: Magnesium: 1.7 mg/dL (ref 1.7–2.4)

## 2020-12-24 MED ORDER — SODIUM CHLORIDE 0.9 % IV SOLN: INTRAVENOUS | Status: DC

## 2020-12-24 NOTE — Progress Notes (Signed)
PROGRESS NOTE  ELLAWYN WOGAN XBD:[id number] DOB: [birth year] DOA: 12/17/2020 PCP: Eunice Blase, PA-C  Brief History   [name] is a 45 y.o. female with medical history significant for Bipolar disorder, diabetes, alcohol use disorder and polysubstance abuse, FSH muscular dystrophy, with history of falls, last seen by neurology in January 2020, who was brought in by EMS after she fell at home 2 days prior and was unable to get up.   While in ED, patient was found to have severe hyponatremia of 113, potassium 2.9.   She also has an ammonia of 52, bilirubin of 11.9. She was given potassium, she was also placed on normal saline. Her chest x-ray also showed a right lower lobe infiltrates, probable aspiration pneumonia.  She is started on antibiotics with Unasyn.   The patient remains jaundiced. She appears to be in hepatic encephalopathy, although her parents at bedside stated that she was awake earlier in the day.  The patient underwent MRI abdomen/liver. No ductal dilatation or obstruction seen. Marked hepatomegaly and steatosis. No evidence of portal hypertension. No acute biliary process. It also demonstrated basilar effusions and bibasilar airspace disease right greater than left.  Consultants  Gastroenterology Nephrology  Procedures  None  Antibiotics   Anti-infectives (From admission, onward)    Start     Dose/Rate Route Frequency Ordered Stop   12/22/20 2200  linezolid (ZYVOX) tablet 600 mg        600 mg Oral Every 12 hours 12/22/20 1143     12/20/20 2200  vancomycin (VANCOCIN) IVPB 1000 mg/200 mL premix  Status:  Discontinued        1,000 mg 200 mL/hr over 60 Minutes Intravenous Every 12 hours 12/20/20 0839 12/22/20 1143   12/20/20 0930  vancomycin (VANCOCIN) 2,000 mg in sodium chloride 0.9 % 500 mL IVPB  Status:  Discontinued        2,000 mg 250 mL/hr over 120 Minutes Intravenous  Once 12/20/20 0839 12/20/20 0907   12/20/20 0930  vancomycin (VANCOCIN) 2,000 mg in sodium  chloride 0.9 % 500 mL IVPB        2,000 mg 250 mL/hr over 120 Minutes Intravenous  Once 12/20/20 0907 12/20/20 1223   12/19/20 1630  rifaximin (XIFAXAN) tablet 550 mg        550 mg Oral 2 times daily 12/19/20 1533     12/18/20 1200  Ampicillin-Sulbactam (UNASYN) 3 g in sodium chloride 0.9 % 100 mL IVPB  Status:  Discontinued        3 g 200 mL/hr over 30 Minutes Intravenous Every 6 hours 12/18/20 1038 12/20/20 0839   12/18/20 0045  Ampicillin-Sulbactam (UNASYN) 3 g in sodium chloride 0.9 % 100 mL IVPB        3 g 200 mL/hr over 30 Minutes Intravenous  Once 12/18/20 0033 12/18/20 0303      Subjective  The patient is remains lethargic.  Objective   Vitals:  Vitals:   12/24/20 0750 12/24/20 1143  BP: 110/65 109/67  Pulse: (!) 103 (!) 106  Resp: 19 20  Temp: 98 F (36.7 C) 98.4 F (36.9 C)  SpO2: 92% 93%    Exam:  Constitutional:  The patient is remains lethargic. No acute distress. Eyes:  pupils and irises appear normal Sclera are icteric Respiratory:  CTA bilaterally, no w/r/r.  Respiratory effort normal. No retractions or accessory muscle use Cardiovascular:  RRR, no m/r/g No LE extremity edema   Normal pedal pulses Abdomen:  Abdomen appears normal; no tenderness  or masses No hernias No HSM Musculoskeletal:  Digits/nails BUE: no clubbing, cyanosis, petechiae, infection exam of joints, bones, muscles of at least one of following: head/neck, RUE, LUE, RLE, LLE   Skin:  No rashes, lesions, ulcers palpation of skin: no induration or nodules Less jaundiced than yesterday Neurologic:  Unable to evaluate due to the patient's inability to cooperate with exam. Psychiatric:  Unable to evaluate due to the patient's inability to cooperate with exam.   I have personally reviewed the following:   Today's Data  Vitals  Lab Data  BMP  Micro Data  Blood culture x 2 No growth MRSA by PCR is positive  Imaging  CT head US abdomen MRI liver/abdomen  Cardiology  Data  EKG  Scheduled Meds:  budesonide  9 mg Oral Daily   Chlorhexidine Gluconate Cloth  6 each Topical Q0600   enoxaparin (LOVENOX) injection  0.5 mg/kg Subcutaneous Q24H   feeding supplement  237 mL Oral BID BM   FLUoxetine  80 mg Oral Daily   fluticasone  1 spray Each Nare Daily   folic acid  1 mg Oral Daily   insulin aspart  0-20 Units Subcutaneous TID WC   insulin aspart  0-5 Units Subcutaneous QHS   insulin glargine-yfgn  8 Units Subcutaneous QHS   lactulose  20 g Oral TID   linezolid  600 mg Oral Q12H   multivitamin with minerals  1 tablet Oral Daily   mupirocin ointment  1 application Nasal BID   OLANZapine  5 mg Oral Daily   pantoprazole (PROTONIX) IV  40 mg Intravenous Q12H   pregabalin  75-150 mg Oral TID   rifaximin  550 mg Oral BID   thiamine  100 mg Oral Daily   Continuous Infusions:  sodium chloride Stopped (12/18/20 2305)   sodium chloride 75 mL/hr at 12/24/20 0750    Principal Problem:   Sepsis ([name]) Active Problems:   Alcohol withdrawal ([name])   Bipolar 2 disorder ([name])   Hypokalemia   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[name])   Acute hyponatremia   Alcohol use disorder, moderate, dependence ([name])   Hepatic steatosis   Polysubstance abuse ([name])   Hyperglycemia due to type 2 diabetes mellitus ([name])   Hyperbilirubinemia   Lactic acidosis   Alcoholic liver disease ([name])   Acute hepatic encephalopathy   LOS: 6 days   A & P  Sepsis ([name]) Resolved. Present on admission. Due to aspiration pneumonia, on IV vanco. MRSA screen +. Will convert to linezolid for dc.   Acute hepatic encephalopathy Pt remains lethargic. She is now receiving an increased dose of lactulose. Continue Xifaxan. OOB and PT/OT.   Hyperbilirubinemia GI following.  Pending hepatitis panel, likely due to ischemic hepatitis.  Normal doppler liver.  MRI abdomen obtained. No biliary obstruction/distention seen.   Hepatic steatosis/hepatomegaly Needs to stop alcohol. Mother  wants for her to go for inpatient alcohol rehab again. I have discussed this with TOC.   Alcohol use disorder, moderate, dependence/Poly substance abuse([name]) Patient is hoping to quit drinking. She will need inpatient rehab.   Acute hyponatremia Resolved. Monitor.   Hyperkalemia IV NS with 20 mEq K discontinued. Start NS at 75 cc/hr   Alcohol withdrawal ([name]) CIWA protocol   Euthyroid sick syndrome Elevated TSH, normal free T4  I have seen and examined this patient myself. I have spent 32 minutes in her evaluation and care.  DVT prophylaxis: Lovenox Code Status: Full Code Family Communication: None available Disposition Plan: tbd  Khloe Hunkele, DO [facility] Direct contact: see [email or web address]  7PM-7AM contact night coverage as above 12/24/2020, 3:59 PM  LOS: 3 days

## 2020-12-24 NOTE — Progress Notes (Signed)
Physical Therapy Treatment Patient Details Name: [name] MRN: [id number] DOB: 03-06-1976 Today's Date: 12/24/2020   History of Present Illness 45 y.o. female with medical history significant for Bipolar disorder, diabetes, alcohol use disorder and polysubstance abuse, FSH muscular dystrophy, with history of frequent falls, last seen by neurology in January 2020, who was brought in by EMS after she fell at home 2 days prior and was unable to get up.    PT Comments    Patient received in bed, family at bedside. Patient is agreeable to PT session. She performed LE strengthening exercises in supine, required mod assist with bed mobility and was able to sit edge of bed x 5 min with supervision. Patient is limited by weakness and fatigue. She will continue to benefit from skilled PT while here to improve strength, activity tolerance and safety.       Recommendations for follow up therapy are one component of a multi-disciplinary discharge planning process, led by the attending physician.  Recommendations may be updated based on patient status, additional functional criteria and insurance authorization.  Follow Up Recommendations  SNF;Supervision/Assistance - 24 hour     Equipment Recommendations  Other (comment) (TBD)    Recommendations for Other Services       Precautions / Restrictions Precautions Precautions: Fall Restrictions Weight Bearing Restrictions: No     Mobility  Bed Mobility Overal bed mobility: Needs Assistance Bed Mobility: Rolling;Supine to Sit;Sit to Supine;Sit to Sidelying Rolling: Min assist   Supine to sit: Mod assist;HOB elevated Sit to supine: Mod assist Sit to sidelying: Min assist General bed mobility comments: Patient able to provide assistance and follows direction well this session. Easily fatigued with activity    Transfers                 General transfer comment: deferred for safety  Ambulation/Gait             General Gait  Details: deferred   Stairs             Wheelchair Mobility    Modified Rankin (Stroke Patients Only)       Balance Overall balance assessment: Needs assistance Sitting-balance support: Feet supported;No upper extremity supported Sitting balance-Leahy Scale: Fair Sitting balance - Comments: patient able to sit edge of bed with close supervision x 5 min. Attempted to scoot in sitting but was unable. Postural control: Other (comment) (poor trunk control in general ( all directions))                                  Cognition Arousal/Alertness: Lethargic Behavior During Therapy: Flat affect Overall Cognitive Status: Difficult to assess                                 General Comments: Pt very lethargic with eyes closed throughout. Able to follow commands, slow processing.      Exercises Other Exercises Other Exercises: B LE exercises: AP, heel slides, hip abd/add 2 x 5 reps (with min assist to reduce friction with hip ABd/Add)    General Comments        Pertinent Vitals/Pain Pain Assessment: Faces Faces Pain Scale: Hurts a little bit Pain Location: generalized Pain Descriptors / Indicators: Discomfort;Grimacing;Guarding;Moaning Pain Intervention(s): Limited activity within patient's tolerance;Monitored during session;Repositioned    Home Living  Prior Function            PT Goals (current goals can now be found in the care plan section) Acute Rehab PT Goals Patient Stated Goal: to get better PT Goal Formulation: With patient Time For Goal Achievement: 01/04/21 Potential to Achieve Goals: Fair Progress towards PT goals: Progressing toward goals    Frequency    Min 2X/week      PT Plan Current plan remains appropriate    Co-evaluation              AM-PAC PT "6 Clicks" Mobility   Outcome Measure  Help needed turning from your back to your side while in a flat bed without using  bedrails?: A Little Help needed moving from lying on your back to sitting on the side of a flat bed without using bedrails?: A Lot Help needed moving to and from a bed to a chair (including a wheelchair)?: Total Help needed standing up from a chair using your arms (e.g., wheelchair or bedside chair)?: Total Help needed to walk in hospital room?: Total Help needed climbing 3-5 steps with a railing? : Total 6 Click Score: 9    End of Session Equipment Utilized During Treatment: Oxygen Activity Tolerance: Patient limited by fatigue;Patient limited by lethargy Patient left: in bed;with call bell/phone within reach;with bed alarm set;with family/visitor present Nurse Communication: Mobility status PT Visit Diagnosis: Other abnormalities of gait and mobility (R26.89);Muscle weakness (generalized) (M62.81);History of falling (Z91.81)     Time: 1330-1400 PT Time Calculation (min) (ACUTE ONLY): 30 min  Charges:  $Therapeutic Exercise: 23-37 mins                     Wilford Merryfield, PT, GCS 12/24/20,2:19 PM

## 2020-12-25 DIAGNOSIS — K709 Alcoholic liver disease, unspecified: Secondary | ICD-10-CM

## 2020-12-25 DIAGNOSIS — A419 Sepsis, unspecified organism: Secondary | ICD-10-CM | POA: Diagnosis not present

## 2020-12-25 DIAGNOSIS — K7200 Acute and subacute hepatic failure without coma: Secondary | ICD-10-CM

## 2020-12-25 DIAGNOSIS — F1020 Alcohol dependence, uncomplicated: Secondary | ICD-10-CM | POA: Diagnosis not present

## 2020-12-25 DIAGNOSIS — K760 Fatty (change of) liver, not elsewhere classified: Secondary | ICD-10-CM

## 2020-12-25 DIAGNOSIS — E871 Hypo-osmolality and hyponatremia: Secondary | ICD-10-CM | POA: Diagnosis not present

## 2020-12-25 DIAGNOSIS — F3181 Bipolar II disorder: Secondary | ICD-10-CM

## 2020-12-25 DIAGNOSIS — K7010 Alcoholic hepatitis without ascites: Secondary | ICD-10-CM | POA: Diagnosis not present

## 2020-12-25 DIAGNOSIS — R6520 Severe sepsis without septic shock: Secondary | ICD-10-CM

## 2020-12-25 DIAGNOSIS — F10930 Alcohol use, unspecified with withdrawal, uncomplicated: Secondary | ICD-10-CM

## 2020-12-25 DIAGNOSIS — F1910 Other psychoactive substance abuse, uncomplicated: Secondary | ICD-10-CM

## 2020-12-25 DIAGNOSIS — E1165 Type 2 diabetes mellitus with hyperglycemia: Secondary | ICD-10-CM

## 2020-12-25 DIAGNOSIS — E876 Hypokalemia: Secondary | ICD-10-CM

## 2020-12-25 DIAGNOSIS — K7682 Hepatic encephalopathy: Secondary | ICD-10-CM | POA: Diagnosis not present

## 2020-12-25 DIAGNOSIS — E872 Acidosis, unspecified: Secondary | ICD-10-CM

## 2020-12-25 DIAGNOSIS — G7102 Facioscapulohumeral muscular dystrophy: Secondary | ICD-10-CM

## 2020-12-25 LAB — CBC
HCT: 32.3 % — ABNORMAL LOW (ref 36.0–46.0)
Hemoglobin: 9.9 g/dL — ABNORMAL LOW (ref 12.0–15.0)
MCH: 33.7 pg (ref 26.0–34.0)
MCHC: 30.7 g/dL (ref 30.0–36.0)
MCV: 109.9 fL — ABNORMAL HIGH (ref 80.0–100.0)
Platelets: 150 10*3/uL (ref 150–400)
RBC: 2.94 MIL/uL — ABNORMAL LOW (ref 3.87–5.11)
RDW: 25.1 % — ABNORMAL HIGH (ref 11.5–15.5)
WBC: 16 10*3/uL — ABNORMAL HIGH (ref 4.0–10.5)
nRBC: 0.5 % — ABNORMAL HIGH (ref 0.0–0.2)

## 2020-12-25 LAB — SODIUM
Sodium: 134 mmol/L — ABNORMAL LOW (ref 135–145)
Sodium: 135 mmol/L (ref 135–145)
Sodium: 135 mmol/L (ref 135–145)

## 2020-12-25 LAB — COMPREHENSIVE METABOLIC PANEL
ALT: 22 U/L (ref 0–44)
AST: 149 U/L — ABNORMAL HIGH (ref 15–41)
Albumin: <1.5 g/dL — ABNORMAL LOW (ref 3.5–5.0)
Alkaline Phosphatase: 239 U/L — ABNORMAL HIGH (ref 38–126)
Anion gap: 10 (ref 5–15)
BUN: 17 mg/dL (ref 6–20)
CO2: 27 mmol/L (ref 22–32)
Calcium: 8.2 mg/dL — ABNORMAL LOW (ref 8.9–10.3)
Chloride: 98 mmol/L (ref 98–111)
Creatinine, Ser: 0.46 mg/dL (ref 0.44–1.00)
GFR, Estimated: >60 mL/min (ref >60)
Glucose, Bld: 173 mg/dL — ABNORMAL HIGH (ref 70–99)
Potassium: 4.1 mmol/L (ref 3.5–5.1)
Sodium: 135 mmol/L (ref 135–145)
Total Bilirubin: 13.3 mg/dL — ABNORMAL HIGH (ref 0.3–1.2)
Total Protein: 6.3 g/dL — ABNORMAL LOW (ref 6.5–8.1)

## 2020-12-25 LAB — GLUCOSE, CAPILLARY
Glucose-Capillary: 215 mg/dL — ABNORMAL HIGH (ref 70–99)
Glucose-Capillary: 223 mg/dL — ABNORMAL HIGH (ref 70–99)
Glucose-Capillary: 324 mg/dL — ABNORMAL HIGH (ref 70–99)
Glucose-Capillary: 270 mg/dL — ABNORMAL HIGH (ref 70–99)

## 2020-12-25 MED ORDER — ENOXAPARIN SODIUM 60 MG/0.6ML IJ SOSY
0.5000 mg/kg | PREFILLED_SYRINGE | INTRAMUSCULAR | Status: DC
  Administered 2020-12-25 – 2021-01-05 (×12): 57.5 mg via SUBCUTANEOUS
  Filled 2020-12-25: qty 0.6
  Filled 2020-12-25 (×2): qty 0.57
  Filled 2020-12-25: qty 0.6
  Filled 2020-12-25 (×4): qty 0.57
  Filled 2020-12-25: qty 0.6
  Filled 2020-12-25 (×4): qty 0.57

## 2020-12-25 NOTE — Progress Notes (Signed)
[name] , MD [address], [place], [place], [address] [address], [address], [place], [place], [address] Phone: [phone]  Fax: [phone]   ALEENA KIRKEBY is being followed for alcoholic hepatitis   Subjective: More alert , she thinks her thoughts are "more cohesive". No other complaints    Objective: Vital signs in last 24 hours: Vitals:   12/24/20 1639 12/24/20 1900 12/25/20 0547 12/25/20 0731  BP: 95/69 100/62 116/79 107/66  Pulse: 100 93 100 (!) 101  Resp: 18 18  19   Temp: 98.2 F (36.8 C) 97.9 F (36.6 C) 97.6 F (36.4 C) 98.7 F (37.1 C)  TempSrc:      SpO2: 97% 98% 96% 95%  Weight:   112.5 kg   Height:       Weight change: 5.451 kg  Intake/Output Summary (Last 24 hours) at 12/25/2020 0856 Last data filed at 12/25/2020 0300 Gross per 24 hour  Intake 1157.95 ml  Output 500 ml  Net 657.95 ml     Exam: Icterus ++ Alert X o x 3 Heart:: Regular rate and rhythm, S1S2 present, or without murmur or extra heart sounds Lungs: normal, clear to auscultation, and clear to auscultation and percussion Abdomen: soft, nontender, normal bowel sounds   Lab Results: @LABTEST2 @ Micro Results: Recent Results (from the past 240 hour(s))  Resp Panel by RT-PCR (Flu A&B, Covid) Nasopharyngeal Swab     Status: None   Collection Time: 12/18/20  1:16 AM   Specimen: Nasopharyngeal Swab; Nasopharyngeal(NP) swabs in vial transport medium  Result Value Ref Range Status   SARS Coronavirus 2 by RT PCR NEGATIVE NEGATIVE Final    Comment: (NOTE) SARS-CoV-2 target nucleic acids are NOT DETECTED.  The SARS-CoV-2 RNA is generally detectable in upper respiratory specimens during the acute phase of infection. The lowest concentration of SARS-CoV-2 viral copies this assay can detect is 138 copies/mL. A negative result does not preclude SARS-Cov-2 infection and should not be used as the sole basis for treatment or other patient management decisions. A negative  result may occur with  improper specimen collection/handling, submission of specimen other than nasopharyngeal swab, presence of viral mutation(s) within the areas targeted by this assay, and inadequate number of viral copies(<138 copies/mL). A negative result must be combined with clinical observations, patient history, and epidemiological information. The expected result is Negative.  Fact Sheet for Patients:   Fact Sheet for Healthcare Providers:  02/17/21  This test is no t yet approved or cleared by the [employer] FDA and  has been authorized for detection and/or diagnosis of SARS-CoV-2 by FDA under an Emergency Use Authorization (EUA). This EUA will remain  in effect (meaning this test can be used) for the duration of the COVID-19 declaration under Section 564(b)(1) of the Act, 21 U.S.C.section 360bbb-3(b)(1), unless the authorization is terminated  or revoked sooner.       Influenza A by PCR NEGATIVE NEGATIVE Final   Influenza B by PCR NEGATIVE NEGATIVE Final    Comment: (NOTE) The Xpert Xpress SARS-CoV-2/FLU/RSV plus assay is intended as an aid in the diagnosis of influenza from Nasopharyngeal swab specimens and should not be used as a sole basis for treatment. Nasal washings and aspirates are unacceptable for Xpert Xpress SARS-CoV-2/FLU/RSV testing.  Fact Sheet for Patients: [employer]iousBroker[employer]it  Fact Sheet for Healthcare Providers: [place]  This test is not yet approved or cleared by the [employer] FDA and has been authorized for detection and/or diagnosis of SARS-CoV-2 by FDA  under an Emergency Use Authorization (EUA). This EUA will remain in effect (meaning this test can be used) for the duration of the COVID-19 declaration under Section 564(b)(1) of the Act, 21 U.S.C. section 360bbb-3(b)(1), unless the authorization is  terminated or revoked.  Performed at [facility], [address] [place], [place] [address]   Blood culture (routine x 2)     Status: None   Collection Time: 12/18/20  1:16 AM   Specimen: BLOOD  Result Value Ref Range Status   Specimen Description BLOOD RIGHT HAND  Final   Special Requests   Final    BOTTLES DRAWN AEROBIC AND ANAEROBIC Blood Culture results may not be optimal due to an inadequate volume of blood received in culture bottles   Culture   Final    NO GROWTH 5 DAYS Performed at [facility], [address], [place], [place] [address]    Report Status 12/23/2020 FINAL  Final  Blood culture (routine x 2)     Status: None   Collection Time: 12/18/20  1:16 AM   Specimen: BLOOD  Result Value Ref Range Status   Specimen Description BLOOD RIGHT ASSIST CONTROL  Final   Special Requests   Final    BOTTLES DRAWN AEROBIC AND ANAEROBIC Blood Culture adequate volume   Culture   Final    NO GROWTH 5 DAYS Performed at [facility], [address], [place], [place] [address]    Report Status 12/23/2020 FINAL  Final  Varicella-zoster by PCR (Blood or Swab)     Status: None   Collection Time: 12/19/20  3:47 PM   Specimen: Blood  Result Value Ref Range Status   Varicella-Zoster, PCR Negative Negative Final    Comment: (NOTE) No Varicella Zoster Virus DNA detected. This test was developed and its performance characteristics determined by [facility].  It has not been cleared or approved by the Food and Drug Administration.  The FDA has determined that such clearance or approval is not necessary. Performed At: [facility] [address] [place], [place] [id number] Jolene Schimke MD GY:[id number]   MRSA Next Gen by PCR, Nasal     Status: Abnormal   Collection Time: 12/19/20  4:04 PM   Specimen: Nasal Mucosa; Nasal Swab  Result Value Ref Range Status   MRSA by PCR Next Gen DETECTED (A) NOT DETECTED Final    Comment: RESULT CALLED TO, READ  BACK BY AND VERIFIED WITH: AMY DOLTON @1830  ON 12/19/20 SKL (NOTE) The GeneXpert MRSA Assay (FDA approved for NASAL specimens only), is one component of a comprehensive MRSA colonization surveillance program. It is not intended to diagnose MRSA infection nor to guide or monitor treatment for MRSA infections. Test performance is not FDA approved in patients less than 51 years old. Performed at [facility], [address] [place] [place], [place]    Studies/Results: MR LIVER W WO CONTRAST  Result Date: 12/23/2020 CLINICAL DATA:  A 45 year old female presents with jaundice and suspected hepatic encephalopathy. EXAM: MRI ABDOMEN WITHOUT AND WITH CONTRAST TECHNIQUE: Multiplanar multisequence MR imaging of the abdomen was performed both before and after the administration of intravenous contrast. CONTRAST:  30mL GADAVIST GADOBUTROL 1 MMOL/ML IV SOLN COMPARISON:  Comparison made with abdominal sonogram which was performed on October second of 2022 and hepatic Doppler performed on December 20, 2020. FINDINGS: Lower chest: RIGHT effusion and basilar airspace disease, not well assessed in the setting of elevation of the RIGHT hemidiaphragm likely at least partially secondary to marked  hepatomegaly. Small LEFT effusion. Lung base assessment is limited on MRI. Hepatobiliary: Marked hepatomegaly and marked hepatic steatosis. Dedicated MRCP images were not performed though there is no sign of intra or extrahepatic biliary duct distension. Gallbladder is collapsed. Portal and hepatic veins are patent. Pancreas: Normal intrinsic T1 signal. No ductal dilation or sign of inflammation. No focal lesion. Spleen:  Spleen normal in size and contour. Adrenals/Urinary Tract: Adrenal glands are normal. No suspicious renal lesion or hydronephrosis. No perinephric stranding. Stomach/Bowel: Gastrointestinal tract shows no acute process on limited assessment to the extent evaluated on abdominal MRI. Vascular/Lymphatic:  No pathologically enlarged lymph nodes identified. No abdominal aortic aneurysm demonstrated. Other:  Small volume ascites. Musculoskeletal: No suspicious bone lesions identified. Marked muscular atrophy in this patient with history of muscular dystrophy. IMPRESSION: Marked hepatomegaly and marked hepatic steatosis. Lobular hepatic contours and marked hepatomegaly with liver span exceeding 28 cm. In the setting of ascites would correlate with any signs of steatohepatitis. No current evidence of portal hypertension. No acute biliary process. Note the dedicated MRCP images were not performed though there is no biliary duct dilation and the gallbladder is completely collapsed. No biliary duct distension. Basilar effusions and bibasilar airspace disease RIGHT greater than LEFT. Cardiomegaly not well assessed. Electronically Signed   By: Donzetta Kohut M.D.   On: 12/23/2020 14:59   Medications: I have reviewed the patient's current medications. Scheduled Meds:  budesonide  9 mg Oral Daily   Chlorhexidine Gluconate Cloth  6 each Topical Q0600   enoxaparin (LOVENOX) injection  0.5 mg/kg Subcutaneous Q24H   feeding supplement  237 mL Oral BID BM   FLUoxetine  80 mg Oral Daily   fluticasone  1 spray Each Nare Daily   folic acid  1 mg Oral Daily   insulin aspart  0-20 Units Subcutaneous TID WC   insulin aspart  0-5 Units Subcutaneous QHS   insulin glargine-yfgn  8 Units Subcutaneous QHS   lactulose  20 g Oral TID   linezolid  600 mg Oral Q12H   multivitamin with minerals  1 tablet Oral Daily   mupirocin ointment  1 application Nasal BID   OLANZapine  5 mg Oral Daily   pantoprazole (PROTONIX) IV  40 mg Intravenous Q12H   pregabalin  75-150 mg Oral TID   rifaximin  550 mg Oral BID   thiamine  100 mg Oral Daily   Continuous Infusions:  sodium chloride Stopped (12/18/20 2305)   sodium chloride 75 mL/hr at 12/25/20 0035   PRN Meds:.sodium chloride, albuterol, albuterol, methocarbamol, ondansetron **OR**  ondansetron (ZOFRAN) IV   Assessment: Principal Problem:   Sepsis ([name]) Active Problems:   Alcohol withdrawal ([name])   Bipolar 2 disorder ([name])   Hypokalemia   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[name])   Acute hyponatremia   Alcohol use disorder, moderate, dependence ([name])   Hepatic steatosis   Polysubstance abuse ([name])   Hyperglycemia due to type 2 diabetes mellitus ([name])   Hyperbilirubinemia   Lactic acidosis   Alcoholic liver disease ([name])   Acute hepatic encephalopathy   Pressure injury of skin  Kyarra N Aron 45 y.o. female admitted with a fall, pneumonia, hypovolemia, I am following for abnormal Lft's likely  alcoholic hepatitis .  Marland Kitchen She also has hepatic encephelopathy , cocaine in urine. Likely has chronic alcoholic liver disease/cirrhosis. Doppler USG shows no obvious thrombosis. Hep B e Ag is positive with negative viral load  and surface antigen ? False positive. Tbilirubin improving  MRI liver shows no  biliary obstruction , shows hepatic steatosis    Plan: Hepatic encephlopathy : continue lactulose , xifaxan,  Repeat hep B viral load and E antigen in 32- days ? False positive.  Abstain in the future from all alcohol.   4. Not a candidate for steroids due to admission with pneumonia.     LOS: 7 days   [name], MD 12/25/2020, 8:56 AM

## 2020-12-25 NOTE — Progress Notes (Signed)
PROGRESS NOTE  [name] XFG:[id number] DOB: 24-Sep-1975 DOA: 12/17/2020 PCP: Eunice Blase, PA-C  Brief History   [name] is a 45 y.o. female with medical history significant for Bipolar disorder, diabetes, alcohol use disorder and polysubstance abuse, FSH muscular dystrophy, with history of falls, last seen by neurology in January 2020, who was brought in by EMS after she fell at home 2 days prior and was unable to get up.   While in ED, patient was found to have severe hyponatremia of 113, potassium 2.9.   She also has an ammonia of 52, bilirubin of 11.9. She was given potassium, she was also placed on normal saline. Her chest x-ray also showed a right lower lobe infiltrates, probable aspiration pneumonia.  She is started on antibiotics with Unasyn.   The patient remains jaundiced. She appears to be in hepatic encephalopathy, although her parents at bedside stated that she was awake earlier in the day.  The patient underwent MRI abdomen/liver. No ductal dilatation or obstruction seen. Marked hepatomegaly and steatosis. No evidence of portal hypertension. No acute biliary process. It also demonstrated basilar effusions and bibasilar airspace disease right greater than left.  The patient is more alert today, though clearly still lethargic.  Consultants  Gastroenterology Nephrology  Procedures  None  Antibiotics   Anti-infectives (From admission, onward)    Start     Dose/Rate Route Frequency Ordered Stop   12/22/20 2200  linezolid (ZYVOX) tablet 600 mg        600 mg Oral Every 12 hours 12/22/20 1143     12/20/20 2200  vancomycin (VANCOCIN) IVPB 1000 mg/200 mL premix  Status:  Discontinued        1,000 mg 200 mL/hr over 60 Minutes Intravenous Every 12 hours 12/20/20 0839 12/22/20 1143   12/20/20 0930  vancomycin (VANCOCIN) 2,000 mg in sodium chloride 0.9 % 500 mL IVPB  Status:  Discontinued        2,000 mg 250 mL/hr over 120 Minutes Intravenous  Once 12/20/20 0839 12/20/20  0907   12/20/20 0930  vancomycin (VANCOCIN) 2,000 mg in sodium chloride 0.9 % 500 mL IVPB        2,000 mg 250 mL/hr over 120 Minutes Intravenous  Once 12/20/20 0907 12/20/20 1223   12/19/20 1630  rifaximin (XIFAXAN) tablet 550 mg        550 mg Oral 2 times daily 12/19/20 1533     12/18/20 1200  Ampicillin-Sulbactam (UNASYN) 3 g in sodium chloride 0.9 % 100 mL IVPB  Status:  Discontinued        3 g 200 mL/hr over 30 Minutes Intravenous Every 6 hours 12/18/20 1038 12/20/20 0839   12/18/20 0045  Ampicillin-Sulbactam (UNASYN) 3 g in sodium chloride 0.9 % 100 mL IVPB        3 g 200 mL/hr over 30 Minutes Intravenous  Once 12/18/20 0033 12/18/20 0303      Subjective  The patient is remains lethargic. But is a little more responsive.  Objective   Vitals:  Vitals:   12/25/20 1217 12/25/20 1604  BP: 105/70 116/83  Pulse: (!) 105 93  Resp: 18 19  Temp: 98 F (36.7 C) 98.6 F (37 C)  SpO2: 94% 90%    Exam:  Constitutional:  The patient does open eyes and respond verbally to spouse. No acute distress. Eyes:  pupils and irises appear normal Sclera remain icteric, but clearing somewhat Respiratory:  CTA bilaterally, no w/r/r.  Respiratory effort normal. No retractions or  accessory muscle use Cardiovascular:  RRR, no m/r/g No LE extremity edema   Normal pedal pulses Abdomen:  Abdomen appears normal; no tenderness or masses No hernias No HSM Musculoskeletal:  Digits/nails BUE: no clubbing, cyanosis, petechiae, infection exam of joints, bones, muscles of at least one of following: head/neck, RUE, LUE, RLE, LLE   Skin:  No rashes, lesions, ulcers palpation of skin: no induration or nodules Less jaundiced than yesterday Neurologic:  The patient is awake and alert. She is oriented x 2. Moving all extremities. CN II-XII grossly intact. Psychiatric:  Pt seems depressed. Flat affect. Poor insight and judgement.   I have personally reviewed the following:   Today's Data   Vitals  Lab Data  CMP, CBC  Micro Data  Blood culture x 2 No growth MRSA by PCR is positive  Imaging  CT head US abdomen MRI liver/abdomen  Cardiology Data  EKG  Scheduled Meds:  budesonide  9 mg Oral Daily   Chlorhexidine Gluconate Cloth  6 each Topical Q0600   enoxaparin (LOVENOX) injection  0.5 mg/kg Subcutaneous Q24H   feeding supplement  237 mL Oral BID BM   FLUoxetine  80 mg Oral Daily   fluticasone  1 spray Each Nare Daily   folic acid  1 mg Oral Daily   insulin aspart  0-20 Units Subcutaneous TID WC   insulin aspart  0-5 Units Subcutaneous QHS   insulin glargine-yfgn  8 Units Subcutaneous QHS   lactulose  20 g Oral TID   linezolid  600 mg Oral Q12H   multivitamin with minerals  1 tablet Oral Daily   mupirocin ointment  1 application Nasal BID   OLANZapine  5 mg Oral Daily   pantoprazole (PROTONIX) IV  40 mg Intravenous Q12H   pregabalin  75-150 mg Oral TID   rifaximin  550 mg Oral BID   thiamine  100 mg Oral Daily   Continuous Infusions:  sodium chloride Stopped (12/18/20 2305)   sodium chloride 75 mL/hr at 12/25/20 1514    Principal Problem:   Sepsis ([name]) Active Problems:   Alcohol withdrawal ([name])   Bipolar 2 disorder ([name])   Hypokalemia   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[name])   Acute hyponatremia   Alcohol use disorder, moderate, dependence ([name])   Hepatic steatosis   Polysubstance abuse ([name])   Hyperglycemia due to type 2 diabetes mellitus ([name])   Hyperbilirubinemia   Lactic acidosis   Alcoholic liver disease ([name])   Acute hepatic encephalopathy   Pressure injury of skin   LOS: 7 days   A & P  Sepsis ([name]) Resolved. Present on admission. Due to aspiration pneumonia, on IV vanco. MRSA screen +. The patient has converted to linezolid in preparation for dc.   Acute hepatic encephalopathy Pt remains lethargic. She is now receiving an increased dose of lactulose. Continue Xifaxan. OOB and PT/OT.   Hyperbilirubinemia GI  following.  Pending hepatitis panel, likely due to ischemic hepatitis.  Normal doppler liver.  MRI abdomen obtained. No biliary obstruction/distention seen. Jaundice slowly clearing. Bilirubin has decreased from 16.9 to 13.9. Monitor.   Hepatic steatosis/hepatomegaly Needs to stop alcohol. Mother wants for her to go for inpatient alcohol rehab again. I have discussed this with TOC. MRI demonstrated cirrhosis of the liver without portal hypertension or biliary duct obstruction.    Alcohol use disorder, moderate, dependence/Poly substance abuse([name]) Patient is hoping to quit drinking. She will need inpatient rehab, but this will likely have to wait until after she discharged from  SNF.   Acute hyponatremia Resolved. Monitor.   Hyperkalemia IV NS with 20 mEq K discontinued. Start NS at 75 cc/hr   Alcohol withdrawal ([name]) CIWA protocol   Euthyroid sick syndrome Elevated TSH, normal free T4  I have seen and examined this patient myself. I have spent 34 minutes in her evaluation and care.  DVT prophylaxis: Lovenox Code Status: Full Code Family Communication: None available Disposition Plan: SNF   Kista Robb, DO [facility] Direct contact: see [email or web address]  7PM-7AM contact night coverage as above 12/25/2020, 4:27 PM  LOS: 3 days

## 2020-12-26 DIAGNOSIS — K7682 Hepatic encephalopathy: Secondary | ICD-10-CM | POA: Diagnosis not present

## 2020-12-26 DIAGNOSIS — E871 Hypo-osmolality and hyponatremia: Secondary | ICD-10-CM | POA: Diagnosis not present

## 2020-12-26 DIAGNOSIS — A419 Sepsis, unspecified organism: Secondary | ICD-10-CM | POA: Diagnosis not present

## 2020-12-26 DIAGNOSIS — F1020 Alcohol dependence, uncomplicated: Secondary | ICD-10-CM | POA: Diagnosis not present

## 2020-12-26 DIAGNOSIS — K7010 Alcoholic hepatitis without ascites: Secondary | ICD-10-CM | POA: Diagnosis not present

## 2020-12-26 LAB — SODIUM
Sodium: 137 mmol/L (ref 135–145)
Sodium: 137 mmol/L (ref 135–145)
Sodium: 139 mmol/L (ref 135–145)
Sodium: 137 mmol/L (ref 135–145)

## 2020-12-26 LAB — ALKALINE PHOSPHATASE: Alkaline Phosphatase: 211 U/L — ABNORMAL HIGH (ref 38–126)

## 2020-12-26 LAB — GLUCOSE, CAPILLARY
Glucose-Capillary: 173 mg/dL — ABNORMAL HIGH (ref 70–99)
Glucose-Capillary: 180 mg/dL — ABNORMAL HIGH (ref 70–99)
Glucose-Capillary: 197 mg/dL — ABNORMAL HIGH (ref 70–99)
Glucose-Capillary: 269 mg/dL — ABNORMAL HIGH (ref 70–99)

## 2020-12-26 LAB — BILIRUBIN, TOTAL: Total Bilirubin: 11.9 mg/dL — ABNORMAL HIGH (ref 0.3–1.2)

## 2020-12-26 LAB — ALT: ALT: 26 U/L (ref 0–44)

## 2020-12-26 LAB — AST: AST: 148 U/L — ABNORMAL HIGH (ref 15–41)

## 2020-12-26 MED ORDER — PREGABALIN 75 MG PO CAPS
75.0000 mg | ORAL_CAPSULE | Freq: Three times a day (TID) | ORAL | Status: DC
  Administered 2020-12-26 – 2021-01-26 (×93): 75 mg via ORAL
  Filled 2020-12-26 (×94): qty 1

## 2020-12-26 NOTE — Progress Notes (Signed)
Initial Nutrition Assessment  DOCUMENTATION CODES:   Morbid obesity  INTERVENTION:   Ensure Enlive po BID, each supplement provides 350 kcal and 20 grams of protein  MVI po daily   NUTRITION DIAGNOSIS:   Inadequate oral intake related to acute illness as evidenced by per patient/family report.  GOAL:   Patient will meet greater than or equal to 90% of their needs -progressing   MONITOR:   PO intake, Supplement acceptance, Labs, Weight trends, Skin, I & O's  ASSESSMENT:   45 y.o. 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  Pt s/p MRI 10/7 which showed marked hepatomegaly and marked hepatic steatosis.  Pt alert with less confusion today. Pt with improved appetite and oral intake; pt eating 75-100% of meals over the past 2 days and is drinking her Ensure supplements. Per chart, pt is up ~15lbs since admission; pt + 12.9L on her I & Os.   Medications reviewed and include: lovenox, folic acid, insulin, lactulose, MVI, protonix, thiamine, NaCl @75ml /hr  Labs reviewed: Na 139 wnl K 4.1 wnl- 10/9 Mg 1.7 wnl- 10/8 Wbc- 16.0(H), Hgb 9.9(L), Hct 32.3(L)- 10/9 Cbgs- 173, 180 x 24 hrs  Diet Order:   Diet Order             Diet Carb Modified Fluid consistency: Thin; Room service appropriate? Yes  Diet effective now                  EDUCATION NEEDS:   Education needs have been addressed  Skin:  Skin Assessment: Reviewed RN Assessment  Last BM:  10/10- type 6  Height:   Ht Readings from Last 1 Encounters:  12/17/20 5\' 7"  (1.702 m)    Weight:   Wt Readings from Last 1 Encounters:  12/25/20 112.5 kg    Ideal Body Weight:  61.36 kg  BMI:  Body mass index is 38.85 kg/m.  Estimated Nutritional Needs:   Kcal:   2100-2400kcal/day  Protein:  105-120g/day  Fluid:  1.9-2.2L/day  MS, RD, LDN Please refer to [facility] for RD and/or RD on-call/weekend/after hours pager

## 2020-12-26 NOTE — Progress Notes (Signed)
Physical Therapy Treatment Patient Details Name: [name] MRN: [id number] DOB: [birth year] Today's Date: 12/26/2020   History of Present Illness 45 y.o. female with medical history significant for Bipolar disorder, diabetes, alcohol use disorder and polysubstance abuse, FSH muscular dystrophy, with history of frequent falls, last seen by neurology in January 2020, who was brought in by EMS after she fell at home 2 days prior and was unable to get up.    PT Comments    Seen in co-treat with OT, emphasis on OOB to chair.  Able to complete with max assist +2, scoot pivot over level surfaces.  Does attempt to weight shift, reposition within abilities, but requires extensive assist for lift off and lateral movement due to global weakness. Does continue to require supplemental O2 at 1L to maintain sats >88% with activity; will continue to monitor/wean as appopriate. Patient very pleased with transfer completion and OOB status; set up for breakfast, needs in reach.  SaO2 on room air at rest = 90% SaO2 on room air while ambulating = 86% SaO2 on 1 liters of O2 while ambulating = 90%     Recommendations for follow up therapy are one component of a multi-disciplinary discharge planning process, led by the attending physician.  Recommendations may be updated based on patient status, additional functional criteria and insurance authorization.  Follow Up Recommendations  SNF     Equipment Recommendations       Recommendations for Other Services       Precautions / Restrictions Precautions Precautions: Fall Precaution Comments: watch HR Restrictions Weight Bearing Restrictions: No     Mobility  Bed Mobility Overal bed mobility: Needs Assistance Bed Mobility: Supine to Sit   Sidelying to sit: Mod assist;Max assist;+2 for physical assistance       General bed mobility comments: assist for LE management, truncal elevation; does attempt to scoot forward/reposition within ability     Transfers Overall transfer level: Needs assistance   Transfers: Lateral/Scoot Transfers          Lateral/Scoot Transfers: Max assist;+2 physical assistance General transfer comment: scoot pivot towards R, level surfaces; max assist +2 for lift off, lateral movement and weight shift  Ambulation/Gait             General Gait Details: unsafe/unable   Stairs             Wheelchair Mobility    Modified Rankin (Stroke Patients Only)       Balance Overall balance assessment: Needs assistance Sitting-balance support: No upper extremity supported;Feet supported Sitting balance-Leahy Scale: Good Sitting balance - Comments: close sup in midline, limited ability to move outside immediate BOS without UE support or physical assist from therapist                                    Cognition Arousal/Alertness: Awake/alert Behavior During Therapy: WFL for tasks assessed/performed Overall Cognitive Status: Within Functional Limits for tasks assessed                                 General Comments: much more alert and interactive; eager for OOB activities      Exercises      General Comments        Pertinent Vitals/Pain Pain Assessment: No/denies pain    Home Living  Prior Function            PT Goals (current goals can now be found in the care plan section) Acute Rehab PT Goals Patient Stated Goal: to get better PT Goal Formulation: With patient Time For Goal Achievement: 01/04/21 Potential to Achieve Goals: Fair Progress towards PT goals: Progressing toward goals    Frequency    Min 2X/week      PT Plan Current plan remains appropriate    Co-evaluation PT/OT/SLP Co-Evaluation/Treatment: Yes Reason for Co-Treatment: For patient/therapist safety;To address functional/ADL transfers PT goals addressed during session: Mobility/safety with mobility OT goals addressed during session:  Strengthening/ROM;ADL's and self-care      AM-PAC PT "6 Clicks" Mobility   Outcome Measure  Help needed turning from your back to your side while in a flat bed without using bedrails?: A Lot Help needed moving from lying on your back to sitting on the side of a flat bed without using bedrails?: A Lot Help needed moving to and from a bed to a chair (including a wheelchair)?: A Lot Help needed standing up from a chair using your arms (e.g., wheelchair or bedside chair)?: Total Help needed to walk in hospital room?: Total Help needed climbing 3-5 steps with a railing? : Total 6 Click Score: 9    End of Session Equipment Utilized During Treatment: Oxygen Activity Tolerance: Patient tolerated treatment well Patient left: in chair;with call bell/phone within reach;with chair alarm set Nurse Communication: Mobility status (recommend use of lift for return to bed; lift sling obtained and placed in room for nursing) PT Visit Diagnosis: Other abnormalities of gait and mobility (R26.89);Muscle weakness (generalized) (M62.81);History of falling (Z91.81)     Time: 5462-7035 PT Time Calculation (min) (ACUTE ONLY): 30 min  Charges:  $Therapeutic Activity: 8-22 mins                    Akiah Bauch H. Manson Passey, PT, DPT, NCS 12/26/20, 10:51 AM [phone]

## 2020-12-26 NOTE — Progress Notes (Signed)
Inpatient Diabetes Program Recommendations  AACE/ADA: New Consensus Statement on Inpatient Glycemic Control (2015)  Target Ranges:  Prepandial:   less than 140 mg/dL      Peak postprandial:   less than 180 mg/dL (1-2 hours)      Critically ill patients:  140 - 180 mg/dL   Lab Results  Component Value Date   GLUCAP 173 (H) 12/26/2020   HGBA1C 11.1 (H) 12/18/2020    Review of Glycemic Control Results for [name], [name] (MRN [id number]) as of 12/26/2020 11:25  Ref. Range 12/25/2020 12:16 12/25/2020 16:04 12/25/2020 21:16 12/26/2020 08:38  Glucose-Capillary Latest Ref Range: 70 - 99 mg/dL 389 (H) 373 (H) 428 (H) 173 (H)   Diabetes history: DM 2 Home DM Meds: Janumet XR 100/1000 mg Daily                             Metformin 500 mg BID                             Ozempic Qweek Current orders for Inpatient glycemic control:  Semglee 8 units q HS Novolog 0-20 units tid with meals and HS  Inpatient Diabetes Program Recommendations:    At discharge, will need Metformin discontinued due to history of liver disease (note that there is metformin in Janumet).   Consider increasing Semglee to 14 units daily.   Thanks  Beryl Meager, RN, BC-ADM Inpatient Diabetes Coordinator Pager [phone]  (8a-5p)

## 2020-12-26 NOTE — Progress Notes (Signed)
Physical Therapy Treatment Patient Details Name: [name] MRN: [id number] DOB: 1975/06/08 Today's Date: 12/26/2020   History of Present Illness 45 y.o. female with medical history significant for Bipolar disorder, diabetes, alcohol use disorder and polysubstance abuse, FSH muscular dystrophy, with history of frequent falls, last seen by neurology in January 2020, who was brought in by EMS after she fell at home 2 days prior and was unable to get up.    PT Comments    Seen per request of CNA for return to bed (from recliner).  Max assist +2 for scoot pivot transfer, bed/chair towards L.  Extensive cuing/assist for forward trunk lean and lateral movement; prefers to hold therapist with UEs for additional stability.  Limited ability to actively assist with LEs.     Recommendations for follow up therapy are one component of a multi-disciplinary discharge planning process, led by the attending physician.  Recommendations may be updated based on patient status, additional functional criteria and insurance authorization.  Follow Up Recommendations  SNF     Equipment Recommendations       Recommendations for Other Services       Precautions / Restrictions Precautions Precautions: Fall Precaution Comments: watch HR Restrictions Weight Bearing Restrictions: No     Mobility  Bed Mobility Overal bed mobility: Needs Assistance Bed Mobility: Sit to Supine   Sidelying to sit: Mod assist;Max assist;+2 for physical assistance   Sit to supine: Max assist;+2 for physical assistance   General bed mobility comments: extensive assist to elevate LEs over edge of bed    Transfers Overall transfer level: Needs assistance   Transfers: Lateral/Scoot Transfers          Lateral/Scoot Transfers: Max assist;+2 physical assistance General transfer comment: scoot pivot towards L for return to bed, max assist +2.  Consistent cuing/assist for forward trunk lean to; patient prefers to hold  therapist in transfer for additional stability.  Limited ability to actively assist with LEs  Ambulation/Gait             General Gait Details: unsafe/unable   Stairs             Wheelchair Mobility    Modified Rankin (Stroke Patients Only)       Balance Overall balance assessment: Needs assistance Sitting-balance support: No upper extremity supported;Feet supported Sitting balance-Leahy Scale: Fair Sitting balance - Comments: close sup in midline, limited ability to move outside immediate BOS without UE support or physical assist from therapist                                    Cognition Arousal/Alertness: Awake/alert Behavior During Therapy: WFL for tasks assessed/performed Overall Cognitive Status: Within Functional Limits for tasks assessed                                 General Comments: much more alert and interactive; eager for OOB activities      Exercises      General Comments        Pertinent Vitals/Pain Pain Assessment: No/denies pain    Home Living                      Prior Function            PT Goals (current goals can now be found in the care plan section)  Acute Rehab PT Goals Patient Stated Goal: to get better PT Goal Formulation: With patient Time For Goal Achievement: 01/04/21 Potential to Achieve Goals: Fair Progress towards PT goals: Progressing toward goals    Frequency    Min 2X/week      PT Plan Current plan remains appropriate    Co-evaluation PT/OT/SLP Co-Evaluation/Treatment: Yes Reason for Co-Treatment: For patient/therapist safety;To address functional/ADL transfers PT goals addressed during session: Mobility/safety with mobility OT goals addressed during session: Strengthening/ROM;ADL's and self-care      AM-PAC PT "6 Clicks" Mobility   Outcome Measure  Help needed turning from your back to your side while in a flat bed without using bedrails?: A Lot Help  needed moving from lying on your back to sitting on the side of a flat bed without using bedrails?: A Lot Help needed moving to and from a bed to a chair (including a wheelchair)?: A Lot Help needed standing up from a chair using your arms (e.g., wheelchair or bedside chair)?: Total Help needed to walk in hospital room?: Total Help needed climbing 3-5 steps with a railing? : Total 6 Click Score: 9    End of Session Equipment Utilized During Treatment: Oxygen Activity Tolerance: Patient tolerated treatment well Patient left: in chair;with call bell/phone within reach;with chair alarm set Nurse Communication: Mobility status PT Visit Diagnosis: Other abnormalities of gait and mobility (R26.89);Muscle weakness (generalized) (M62.81);History of falling (Z91.81)     Time: 1110-1120 PT Time Calculation (min) (ACUTE ONLY): 10 min  Charges:  $Therapeutic Activity: 8-22 mins                     [name], PT, DPT, NCS 12/26/20, 11:38 AM [phone]

## 2020-12-26 NOTE — Progress Notes (Signed)
PROGRESS NOTE  [name] DUK:[id number] DOB: 11-08-1975 DOA: 12/17/2020 PCP: Eunice Blase, PA-C  Brief History   [name] is a 45 y.o. female with medical history significant for Bipolar disorder, diabetes, alcohol use disorder and polysubstance abuse, FSH muscular dystrophy, with history of falls, last seen by neurology in January 2020, who was brought in by EMS after she fell at home 2 days prior and was unable to get up.   While in ED, patient was found to have severe hyponatremia of 113, potassium 2.9.   She also has an ammonia of 52, bilirubin of 11.9. She was given potassium, she was also placed on normal saline. Her chest x-ray also showed a right lower lobe infiltrates, probable aspiration pneumonia.  She is started on antibiotics with Unasyn.   The patient remains jaundiced. She appears to be in hepatic encephalopathy, although her parents at bedside stated that she was awake earlier in the day.  The patient underwent MRI abdomen/liver. No ductal dilatation or obstruction seen. Marked hepatomegaly and steatosis. No evidence of portal hypertension. No acute biliary process. It also demonstrated basilar effusions and bibasilar airspace disease right greater than left.  During my visit with the patient this morning, she was much more alert and communicative. She was agreeable to increasing her activity and getting up to chair. However, I have just been contacted by the patient's nurse and told that she is now lethargic again. I have reduced her lyrica from 150 mg to 75 mg.   Consultants  Gastroenterology Nephrology  Procedures  None  Antibiotics   Anti-infectives (From admission, onward)    Start     Dose/Rate Route Frequency Ordered Stop   12/22/20 2200  linezolid (ZYVOX) tablet 600 mg        600 mg Oral Every 12 hours 12/22/20 1143     12/20/20 2200  vancomycin (VANCOCIN) IVPB 1000 mg/200 mL premix  Status:  Discontinued        1,000 mg 200 mL/hr over 60 Minutes  Intravenous Every 12 hours 12/20/20 0839 12/22/20 1143   12/20/20 0930  vancomycin (VANCOCIN) 2,000 mg in sodium chloride 0.9 % 500 mL IVPB  Status:  Discontinued        2,000 mg 250 mL/hr over 120 Minutes Intravenous  Once 12/20/20 0839 12/20/20 0907   12/20/20 0930  vancomycin (VANCOCIN) 2,000 mg in sodium chloride 0.9 % 500 mL IVPB        2,000 mg 250 mL/hr over 120 Minutes Intravenous  Once 12/20/20 0907 12/20/20 1223   12/19/20 1630  rifaximin (XIFAXAN) tablet 550 mg        550 mg Oral 2 times daily 12/19/20 1533     12/18/20 1200  Ampicillin-Sulbactam (UNASYN) 3 g in sodium chloride 0.9 % 100 mL IVPB  Status:  Discontinued        3 g 200 mL/hr over 30 Minutes Intravenous Every 6 hours 12/18/20 1038 12/20/20 0839   12/18/20 0045  Ampicillin-Sulbactam (UNASYN) 3 g in sodium chloride 0.9 % 100 mL IVPB        3 g 200 mL/hr over 30 Minutes Intravenous  Once 12/18/20 0033 12/18/20 0303      Subjective  The patient is awake and alert. No acute distress.  Objective   Vitals:  Vitals:   12/26/20 0832 12/26/20 1056  BP: 113/73 92/61  Pulse: (!) 111 94  Resp: 17 18  Temp: 98 F (36.7 C) 97.9 F (36.6 C)  SpO2: 91% 94%  Exam:  Constitutional:  The patient is awake, alert, and oriented x 3 during my visit.  Eyes:  pupils and irises appear normal Sclera remain icteric, but clearing. Respiratory:  CTA bilaterally, no w/r/r.  Respiratory effort normal. No retractions or accessory muscle use Cardiovascular:  RRR, no m/r/g 2+ pitting edema of lower extremities bilateral Normal pedal pulses Abdomen:  Abdomen appears normal; no tenderness or masses No hernias No HSM Musculoskeletal:  Digits/nails BUE: no clubbing, cyanosis, petechiae, infection exam of joints, bones, muscles of at least one of following: head/neck, RUE, LUE, RLE, LLE   Skin:  No rashes, lesions, ulcers palpation of skin: no induration or nodules Less jaundiced than yesterday Neurologic:  The patient  is awake and alert. She is oriented x 3. Moving all extremities. CN II-XII grossly intact. Psychiatric:  Pt seems depressed. Flat affect. Poor insight and judgement.   I have personally reviewed the following:   Today's Data  Vitals  Lab Data  CMP, CBC  Micro Data  Blood culture x 2 No growth MRSA by PCR is positive  Imaging  CT head US abdomen MRI liver/abdomen  Cardiology Data  EKG  Scheduled Meds:  budesonide  9 mg Oral Daily   enoxaparin (LOVENOX) injection  0.5 mg/kg Subcutaneous Q24H   feeding supplement  237 mL Oral BID BM   FLUoxetine  80 mg Oral Daily   fluticasone  1 spray Each Nare Daily   folic acid  1 mg Oral Daily   insulin aspart  0-20 Units Subcutaneous TID WC   insulin aspart  0-5 Units Subcutaneous QHS   insulin glargine-yfgn  8 Units Subcutaneous QHS   lactulose  20 g Oral TID   linezolid  600 mg Oral Q12H   multivitamin with minerals  1 tablet Oral Daily   OLANZapine  5 mg Oral Daily   pantoprazole (PROTONIX) IV  40 mg Intravenous Q12H   pregabalin  75-150 mg Oral TID   rifaximin  550 mg Oral BID   thiamine  100 mg Oral Daily   Continuous Infusions:  sodium chloride Stopped (12/18/20 2305)   sodium chloride 75 mL/hr at 12/25/20 1514    Principal Problem:   Sepsis ([name]) Active Problems:   Alcohol withdrawal ([name])   Bipolar 2 disorder ([name])   Hypokalemia   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[name])   Acute hyponatremia   Alcohol use disorder, moderate, dependence ([name])   Hepatic steatosis   Polysubstance abuse ([name])   Hyperglycemia due to type 2 diabetes mellitus ([name])   Hyperbilirubinemia   Lactic acidosis   Alcoholic liver disease ([name])   Acute hepatic encephalopathy   Pressure injury of skin   LOS: 8 days   A & P  Sepsis ([name]) Resolved. Present on admission. Due to aspiration pneumonia, on IV vanco. MRSA screen +. The patient has converted to linezolid in preparation for dc.   Acute hepatic  encephalopathy During my visit with the patient this morning, she was much more alert and communicative. She was agreeable to increasing her activity and getting up to chair. However, I have just been contacted by the patient's nurse and told that she is now lethargic again. I have reduced her lyrica from 150 mg to 75 mg. She is now receiving lactulose 20 gm tid. Continue Xifaxan. OOB and PT/OT.   Hyperbilirubinemia GI following.  Pending hepatitis panel, likely due to ischemic hepatitis.  Normal doppler liver.  MRI abdomen obtained. No biliary obstruction/distention seen. Jaundice slowly clearing. Bilirubin has decreased  from 16.9 to 13.3. Monitor.   Hepatic steatosis/hepatomegaly Needs to stop alcohol. Mother wants for her to go for inpatient alcohol rehab again. I have discussed this with TOC. MRI demonstrated cirrhosis of the liver without portal hypertension or biliary duct obstruction.    Alcohol use disorder, moderate, dependence/Poly substance abuse([name]) Patient is hoping to quit drinking. She will need inpatient rehab, but this will likely have to wait until after she discharged from SNF.   Acute hyponatremia Resolved. Monitor.   Hyperkalemia IV NS with 20 mEq K discontinued. Start NS at 75 cc/hr   Alcohol withdrawal ([name]) CIWA protocol   Euthyroid sick syndrome Elevated TSH, normal free T4  I have seen and examined this patient myself. I have spent 34 minutes in her evaluation and care.  DVT prophylaxis: Lovenox Code Status: Full Code Family Communication: None available Disposition Plan: SNF   Ame Heagle, DO [facility] Direct contact: see [email or web address]  7PM-7AM contact night coverage as above 12/26/2020, 4:21 PM  LOS: 3 days

## 2020-12-26 NOTE — Progress Notes (Signed)
Kellie Bouillon, MD [address], [address], [place], [place], [address] [address], [place], [place], [address] Phone: [phone]  Fax: [phone]   Subjective: Patient sitting up in bed.  Appears jaundiced.  Alert and oriented.  Denies any complaints.   Objective: Exam: Vital signs in last 24 hours: Vitals:   12/26/20 0018 12/26/20 0347 12/26/20 0832 12/26/20 1056  BP: 112/72 114/72 113/73 92/61  Pulse: 94 96 (!) 111 94  Resp: 18 18 17 18   Temp: 98 F (36.7 C) 98.6 F (37 C) 98 F (36.7 C) 97.9 F (36.6 C)  TempSrc:      SpO2: 93% 94% 91% 94%  Weight:      Height:       Weight change:   Intake/Output Summary (Last 24 hours) at 12/26/2020 1308 Last data filed at 12/26/2020 1057 Gross per 24 hour  Intake 1100 ml  Output 1200 ml  Net -100 ml    Constitutional: General:   Alert,  Well-developed, well-nourished, pleasant and cooperative in NAD BP 92/61 (BP Location: Right Arm)   Pulse 94   Temp 97.9 F (36.6 C)   Resp 18   Ht 5\' 7"  (1.702 m)   Wt 112.5 kg   LMP  (LMP Unknown)   SpO2 94%   BMI 38.85 kg/m   Eyes:  Scleral icterus.   Conjunctiva pink.   Ears:  No scars, lesions or masses, Normal auditory acuity. Nose:  No deformity, discharge, or lesions. Mouth:  No deformity or lesions, oropharynx pink & moist.  Neck:  Supple; no masses, trachea midline  Respiratory: Normal respiratory effort  Gastrointestinal:  Soft, non-tender and non-distended without masses, hepatosplenomegaly or hernias noted.  No guarding or rebound tenderness.     Cardiac: No clubbing or edema.  No cyanosis. Normal posterior tibial pedal pulses noted.  Lymphatic:  No significant cervical adenopathy.  Psych:  Alert and cooperative. Normal mood and affect.  Musculoskeletal:  Head normocephalic, atraumatic. 5/5 Lower extremity strength bilaterally.  Skin: Warm. Intact without significant lesions or rashes. jaundice present.  Neurologic:  Face symmetrical,  tongue midline, Normal sensation to touch  Psych:  Alert and oriented x3, Alert and cooperative. Normal mood and affect.   Lab Results: Lab Results  Component Value Date   WBC 16.0 (H) 12/25/2020   HGB 9.9 (L) 12/25/2020   HCT 32.3 (L) 12/25/2020   MCV 109.9 (H) 12/25/2020   PLT 150 12/25/2020   Micro Results: Recent Results (from the past 240 hour(s))  Resp Panel by RT-PCR (Flu A&B, Covid) Nasopharyngeal Swab     Status: None   Collection Time: 12/18/20  1:16 AM   Specimen: Nasopharyngeal Swab; Nasopharyngeal(NP) swabs in vial transport medium  Result Value Ref Range Status   SARS Coronavirus 2 by RT PCR NEGATIVE NEGATIVE Final    Comment: (NOTE) SARS-CoV-2 target nucleic acids are NOT DETECTED.  The SARS-CoV-2 RNA is generally detectable in upper respiratory specimens during the acute phase of infection. The lowest concentration of SARS-CoV-2 viral copies this assay can detect is 138 copies/mL. A negative result does not preclude SARS-Cov-2 infection and should not be used as the sole basis for treatment or other patient management decisions. A negative result may occur with  improper specimen collection/handling, submission of specimen other than nasopharyngeal swab, presence of viral mutation(s) within the areas targeted by this assay, and inadequate number of viral copies(<138 copies/mL). A negative result must be combined with clinical observations, patient history, and epidemiological information. The  expected result is Negative.  Fact Sheet for Patients:  BloggerCourse.com  Fact Sheet for Healthcare Providers:  SeriousBroker.it  This test is no t yet approved or cleared by the [place] FDA and  has been authorized for detection and/or diagnosis of SARS-CoV-2 by FDA under an Emergency Use Authorization (EUA). This EUA will remain  in effect (meaning this test can be used) for the duration of the COVID-19  declaration under Section 564(b)(1) of the Act, 21 U.S.C.section 360bbb-3(b)(1), unless the authorization is terminated  or revoked sooner.       Influenza A by PCR NEGATIVE NEGATIVE Final   Influenza B by PCR NEGATIVE NEGATIVE Final    Comment: (NOTE) The Xpert Xpress SARS-CoV-2/FLU/RSV plus assay is intended as an aid in the diagnosis of influenza from Nasopharyngeal swab specimens and should not be used as a sole basis for treatment. Nasal washings and aspirates are unacceptable for Xpert Xpress SARS-CoV-2/FLU/RSV testing.  Fact Sheet for Patients: BloggerCourse.com  Fact Sheet for Healthcare Providers: SeriousBroker.it  This test is not yet approved or cleared by the [place] FDA and has been authorized for detection and/or diagnosis of SARS-CoV-2 by FDA under an Emergency Use Authorization (EUA). This EUA will remain in effect (meaning this test can be used) for the duration of the COVID-19 declaration under Section 564(b)(1) of the Act, 21 U.S.C. section 360bbb-3(b)(1), unless the authorization is terminated or revoked.  Performed at [facility], [address] [place], [place] [address]   Blood culture (routine x 2)     Status: None   Collection Time: 12/18/20  1:16 AM   Specimen: BLOOD  Result Value Ref Range Status   Specimen Description BLOOD RIGHT HAND  Final   Special Requests   Final    BOTTLES DRAWN AEROBIC AND ANAEROBIC Blood Culture results may not be optimal due to an inadequate volume of blood received in culture bottles   Culture   Final    NO GROWTH 5 DAYS Performed at [facility], [address], [place], [place] [address]    Report Status 12/23/2020 FINAL  Final  Blood culture (routine x 2)     Status: None   Collection Time: 12/18/20  1:16 AM   Specimen: BLOOD  Result Value Ref Range Status   Specimen Description BLOOD RIGHT ASSIST CONTROL  Final   Special Requests    Final    BOTTLES DRAWN AEROBIC AND ANAEROBIC Blood Culture adequate volume   Culture   Final    NO GROWTH 5 DAYS Performed at [facility], [address], [place], [place] [address]    Report Status 12/23/2020 FINAL  Final  Varicella-zoster by PCR (Blood or Swab)     Status: None   Collection Time: 12/19/20  3:47 PM   Specimen: Blood  Result Value Ref Range Status   Varicella-Zoster, PCR Negative Negative Final    Comment: (NOTE) No Varicella Zoster Virus DNA detected. This test was developed and its performance characteristics determined by [facility].  It has not been cleared or approved by the Food and Drug Administration.  The FDA has determined that such clearance or approval is not necessary. Performed At: [facility] [address] [place], [place] [id number] Jolene Schimke MD [id number]   MRSA Next Gen by PCR, Nasal     Status: Abnormal   Collection Time: 12/19/20  4:04 PM   Specimen: Nasal Mucosa; Nasal Swab  Result Value Ref Range Status   MRSA by PCR Next Gen  DETECTED (A) NOT DETECTED Final    Comment: RESULT CALLED TO, READ BACK BY AND VERIFIED WITH: AMY DOLTON @1830  ON 12/19/20 SKL (NOTE) The GeneXpert MRSA Assay (FDA approved for NASAL specimens only), is one component of a comprehensive MRSA colonization surveillance program. It is not intended to diagnose MRSA infection nor to guide or monitor treatment for MRSA infections. Test performance is not FDA approved in patients less than 69 years old. Performed at [facility], [address], [place], [place]    Studies/Results: No results found. Medications:  Scheduled Meds:  budesonide  9 mg Oral Daily   enoxaparin (LOVENOX) injection  0.5 mg/kg Subcutaneous Q24H   feeding supplement  237 mL Oral BID BM   FLUoxetine  80 mg Oral Daily   fluticasone  1 spray Each Nare Daily   folic acid  1 mg Oral Daily   insulin aspart  0-20 Units Subcutaneous TID WC   insulin aspart   0-5 Units Subcutaneous QHS   insulin glargine-yfgn  8 Units Subcutaneous QHS   lactulose  20 g Oral TID   linezolid  600 mg Oral Q12H   multivitamin with minerals  1 tablet Oral Daily   OLANZapine  5 mg Oral Daily   pantoprazole (PROTONIX) IV  40 mg Intravenous Q12H   pregabalin  75-150 mg Oral TID   rifaximin  550 mg Oral BID   thiamine  100 mg Oral Daily   Continuous Infusions:  sodium chloride Stopped (12/18/20 2305)   sodium chloride 75 mL/hr at 12/25/20 1514   PRN Meds:.sodium chloride, albuterol, albuterol, methocarbamol, ondansetron **OR** ondansetron (ZOFRAN) IV   Assessment: Principal Problem:   Sepsis ([name]) Active Problems:   Alcohol withdrawal ([name])   Bipolar 2 disorder ([name])   Hypokalemia   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[name])   Acute hyponatremia   Alcohol use disorder, moderate, dependence ([name])   Hepatic steatosis   Polysubstance abuse ([name])   Hyperglycemia due to type 2 diabetes mellitus ([name])   Hyperbilirubinemia   Lactic acidosis   Alcoholic liver disease ([name])   Acute hepatic encephalopathy   Pressure injury of skin    Plan: Patient appears alert and oriented and does not appear confused today  Alcohol abstinence encouraged.   Labs this morning did not include liver enzymes.  I have added these on to evaluate if they are improving  Steroids not started due to pneumonia on admission  Avoid hepatotoxic drugs  Patient will need close follow-up in clinic at discharge as well   LOS: 8 days   02/24/21, MD 12/26/2020, 1:08 PM

## 2020-12-26 NOTE — Progress Notes (Signed)
Occupational Therapy Treatment Patient Details Name: [name] MRN: [id number] DOB: 12-23-1975 Today's Date: 12/26/2020   History of present illness 45 y.o. female with medical history significant for Bipolar disorder, diabetes, alcohol use disorder and polysubstance abuse, FSH muscular dystrophy, with history of frequent falls, last seen by neurology in January 2020, who was brought in by EMS after she fell at home 2 days prior and was unable to get up.   OT comments  Pt seen for skilled co-tx with PT with focus on functional mobility. Pt appears more alert and motivated for therapeutic intervention with goal to transfer into recliner chair this session. Pt needing +2 assist for safety to get to EOB. Pt maintaining static sitting balance with min A. Pt putting forth good effort but needing max A of 2 for lateral transfer to the right. Pt positioned in chair for safety and comfort. All needs within reach and breakfast tray placed in front of her. Pt did not appear to have any difficulty getting into containers and packages.    Recommendations for follow up therapy are one component of a multi-disciplinary discharge planning process, led by the attending physician.  Recommendations may be updated based on patient status, additional functional criteria and insurance authorization.    Follow Up Recommendations  SNF    Equipment Recommendations  3 in 1 bedside commode       Precautions / Restrictions Precautions Precautions: Fall Precaution Comments: watch HR Restrictions Weight Bearing Restrictions: No       Mobility Bed Mobility Overal bed mobility: Needs Assistance Bed Mobility: Supine to Sit   Sidelying to sit: Mod assist;Max assist;+2 for physical assistance Supine to sit: Max assist;+2 for physical assistance Sit to supine: Max assist;+2 for physical assistance   General bed mobility comments: extensive assist to elevate LEs over edge of bed    Transfers Overall  transfer level: Needs assistance   Transfers: Lateral/Scoot Transfers          Lateral/Scoot Transfers: Max assist;+2 physical assistance General transfer comment: scoot pivot towards L for return to bed, max assist +2.  Consistent cuing/assist for forward trunk lean to; patient prefers to hold therapist in transfer for additional stability.  Limited ability to actively assist with LEs    Balance Overall balance assessment: Needs assistance Sitting-balance support: No upper extremity supported;Feet supported Sitting balance-Leahy Scale: Fair Sitting balance - Comments: close sup in midline, limited ability to move outside immediate BOS without UE support or physical assist from therapist                                   ADL either performed or assessed with clinical judgement     Vision Patient Visual Report: No change from baseline            Cognition Arousal/Alertness: Awake/alert Behavior During Therapy: WFL for tasks assessed/performed Overall Cognitive Status: Within Functional Limits for tasks assessed                                 General Comments: much more alert and interactive; eager for OOB activities                   Pertinent Vitals/ Pain       Pain Assessment: No/denies pain         Frequency  Min 2X/week  Progress Toward Goals  OT Goals(current goals can now be found in the care plan section)  Progress towards OT goals: Progressing toward goals  Acute Rehab OT Goals Patient Stated Goal: to get into chair OT Goal Formulation: Patient unable to participate in goal setting Time For Goal Achievement: 01/02/21 Potential to Achieve Goals: Fair  Plan Discharge plan remains appropriate;Frequency remains appropriate    Co-evaluation    PT/OT/SLP Co-Evaluation/Treatment: Yes Reason for Co-Treatment: For patient/therapist safety;To address functional/ADL transfers PT goals addressed during session:  Mobility/safety with mobility OT goals addressed during session: Strengthening/ROM;ADL's and self-care      AM-PAC OT "6 Clicks" Daily Activity     Outcome Measure     Help from another person taking care of personal grooming?: A Little Help from another person toileting, which includes using toliet, bedpan, or urinal?: Total Help from another person bathing (including washing, rinsing, drying)?: A Lot Help from another person to put on and taking off regular upper body clothing?: A Little Help from another person to put on and taking off regular lower body clothing?: A Lot 6 Click Score: 11    End of Session    OT Visit Diagnosis: Unsteadiness on feet (R26.81);Muscle weakness (generalized) (M62.81);History of falling (Z91.81)   Activity Tolerance Patient limited by fatigue;Patient limited by lethargy   Patient Left in bed;with call bell/phone within reach;with bed alarm set   Nurse Communication Mobility status        Time: 1010-1033 OT Time Calculation (min): 23 min  Charges: OT General Charges $OT Visit: 1 Visit OT Treatments $Therapeutic Activity: 8-22 mins  Jackquline Denmark, MS, OTR/L , CBIS ascom [phone]  12/26/20, 2:37 PM

## 2020-12-27 DIAGNOSIS — F1020 Alcohol dependence, uncomplicated: Secondary | ICD-10-CM | POA: Diagnosis not present

## 2020-12-27 DIAGNOSIS — A419 Sepsis, unspecified organism: Secondary | ICD-10-CM | POA: Diagnosis not present

## 2020-12-27 DIAGNOSIS — E871 Hypo-osmolality and hyponatremia: Secondary | ICD-10-CM | POA: Diagnosis not present

## 2020-12-27 DIAGNOSIS — K7682 Hepatic encephalopathy: Secondary | ICD-10-CM | POA: Diagnosis not present

## 2020-12-27 LAB — GLUCOSE, CAPILLARY
Glucose-Capillary: 224 mg/dL — ABNORMAL HIGH (ref 70–99)
Glucose-Capillary: 228 mg/dL — ABNORMAL HIGH (ref 70–99)
Glucose-Capillary: 244 mg/dL — ABNORMAL HIGH (ref 70–99)
Glucose-Capillary: 219 mg/dL — ABNORMAL HIGH (ref 70–99)

## 2020-12-27 LAB — SODIUM
Sodium: 138 mmol/L (ref 135–145)
Sodium: 140 mmol/L (ref 135–145)
Sodium: 138 mmol/L (ref 135–145)
Sodium: 138 mmol/L (ref 135–145)

## 2020-12-27 NOTE — Progress Notes (Signed)
Physical Therapy Treatment Patient Details Name: [name] MRN: [id number] DOB: 30-Oct-1975 Today's Date: 12/27/2020   History of Present Illness 45 y.o. female with medical history significant for Bipolar disorder, diabetes, alcohol use disorder and polysubstance abuse, FSH muscular dystrophy, with history of frequent falls, last seen by neurology in January 2020, who was brought in by EMS after she fell at home 2 days prior and was unable to get up.    PT Comments    Pt received sitting in recliner, agreeable to therapy. Her cousin/roommate is in the room. Pt requesting to return to bed stating she has been in the recliner for hours and is fatigued. Pt reported transferring back to bed with assist x1 yesterday. PT attempted individually without success and called for assistance from nursing staff. With MAX Ax2 to lift and pivot with PT providing bilateral knee block, pt performed lateral scoot transfer with 2 scoots. VC on sequencing and coordination of efforts so pt and staff are providing effort simultaneously. Pt did perform 3 therapeutic exercises with limited reps due to weakness and fatigue. Unsupported sitting was also performed for core strengthening although pt continued to lean left to use LUE support. Would benefit from skilled PT to address above deficits and promote optimal return to PLOF.   Recommendations for follow up therapy are one component of a multi-disciplinary discharge planning process, led by the attending physician.  Recommendations may be updated based on patient status, additional functional criteria and insurance authorization.  Follow Up Recommendations  SNF     Equipment Recommendations  Other (comment) (TBD)    Recommendations for Other Services       Precautions / Restrictions Precautions Precautions: Fall Precaution Comments: watch HR Restrictions Weight Bearing Restrictions: No     Mobility  Bed Mobility Overal bed mobility: Needs  Assistance Bed Mobility: Sit to Supine     Supine to sit: Max assist Sit to supine: Mod assist   General bed mobility comments: to manage BLE into bed    Transfers Overall transfer level: Needs assistance   Transfers: Lateral/Scoot Transfers          Lateral/Scoot Transfers: Max assist;+2 physical assistance General transfer comment: attempted sit>stand - unsuccessful. Lateral/scoot transfer then performed in 2 scoots with MAX A to lift and pivot. VC on sequencing and coordinating movement provided throughout transfer.  Ambulation/Gait             General Gait Details: unsafe/unable   Stairs             Wheelchair Mobility    Modified Rankin (Stroke Patients Only)       Balance Overall balance assessment: Needs assistance Sitting-balance support: No upper extremity supported;Feet supported Sitting balance-Leahy Scale: Fair Sitting balance - Comments: close supervision - min guard for static sitting; pt continually leaning to the left for LUE support as PT encouraged no UE support for core training - 4 minutes.                                    Cognition Arousal/Alertness: Awake/alert Behavior During Therapy: WFL for tasks assessed/performed Overall Cognitive Status: Within Functional Limits for tasks assessed                                 General Comments: pt is more alert and cooperative with OOB activities/therapy  Exercises General Exercises - Lower Extremity Ankle Circles/Pumps: AROM;20 reps;Seated (decreased ROM due to limited DF) Long Arc Quad: Strengthening;Both;5 reps (low reps 2/2 weakness) Straight Leg Raises: Strengthening;5 reps;Both (incomplete ROM and low reps 2/2 weakness)    General Comments        Pertinent Vitals/Pain Pain Assessment: No/denies pain Faces Pain Scale: Hurts a little bit Pain Location: generalized Pain Descriptors / Indicators: Discomfort;Grimacing;Guarding;Moaning Pain  Intervention(s): Limited activity within patient's tolerance;Monitored during session;Repositioned    Home Living                      Prior Function            PT Goals (current goals can now be found in the care plan section) Acute Rehab PT Goals Patient Stated Goal: to get stronger PT Goal Formulation: With patient Time For Goal Achievement: 01/04/21 Potential to Achieve Goals: Fair    Frequency    Min 2X/week      PT Plan      Co-evaluation              AM-PAC PT "6 Clicks" Mobility   Outcome Measure  Help needed turning from your back to your side while in a flat bed without using bedrails?: A Lot Help needed moving from lying on your back to sitting on the side of a flat bed without using bedrails?: A Lot Help needed moving to and from a bed to a chair (including a wheelchair)?: A Lot Help needed standing up from a chair using your arms (e.g., wheelchair or bedside chair)?: Total Help needed to walk in hospital room?: Total Help needed climbing 3-5 steps with a railing? : Total 6 Click Score: 9    End of Session Equipment Utilized During Treatment: Oxygen;Gait belt Activity Tolerance: Patient tolerated treatment well;Patient limited by fatigue Patient left: with call bell/phone within reach;in bed;with bed alarm set;with family/visitor present Nurse Communication: Mobility status PT Visit Diagnosis: Other abnormalities of gait and mobility (R26.89);Muscle weakness (generalized) (M62.81);History of falling (Z91.81)     Time: 1405-1440 PT Time Calculation (min) (ACUTE ONLY): 35 min  Charges:  $Therapeutic Exercise: 8-22 mins $Therapeutic Activity: 8-22 mins                     Basilia Jumbo PT, DPT 12/27/20 3:29 PM [phone]

## 2020-12-27 NOTE — Progress Notes (Signed)
Occupational Therapy Treatment Patient Details Name: [name] MRN: [id number] DOB: September 19, 1975 Today's Date: 12/27/2020   History of present illness 45 y.o. female with medical history significant for Bipolar disorder, diabetes, alcohol use disorder and polysubstance abuse, FSH muscular dystrophy, with history of frequent falls, last seen by neurology in January 2020, who was brought in by EMS after she fell at home 2 days prior and was unable to get up.   OT comments  Upon entering the room, pt supine in bed and agreeable to OT intervention. Pt reports hygiene concerns and believes she has urinated on herself. Pt able to perform peri hygiene with set up A to obtain needed items. Max A to EOB with min guard for sitting balance. Chair set up for transfer. OT attempting to stand pt but pt unable to clear buttocks x 2 attempts. Pt then began to have BM and returned to bed with max A. Pt reports need for bedpan and rolled with min A and placed onto bed pan. RN notified. All needs within reach. Pt continues to benefit from OT intervention and is putting forth good effort.    Recommendations for follow up therapy are one component of a multi-disciplinary discharge planning process, led by the attending physician.  Recommendations may be updated based on patient status, additional functional criteria and insurance authorization.    Follow Up Recommendations  SNF    Equipment Recommendations  3 in 1 bedside commode       Precautions / Restrictions Precautions Precautions: Fall Precaution Comments: watch HR       Mobility Bed Mobility Overal bed mobility: Needs Assistance Bed Mobility: Supine to Sit;Sit to Supine     Supine to sit: Max assist Sit to supine: Max assist;Total assist        Transfers Overall transfer level: Needs assistance               General transfer comment: attempted sit <>stand but pt unable to clear buttocks and then began having a BM    Balance  Overall balance assessment: Needs assistance Sitting-balance support: No upper extremity supported;Feet supported Sitting balance-Leahy Scale: Fair Sitting balance - Comments: close supervision - min guard for static sitting                                   ADL either performed or assessed with clinical judgement   ADL Overall ADL's : Needs assistance/impaired                                             Vision Patient Visual Report: No change from baseline            Cognition Arousal/Alertness: Awake/alert Behavior During Therapy: WFL for tasks assessed/performed Overall Cognitive Status: Within Functional Limits for tasks assessed                                 General Comments: much more alert and interactive; eager for OOB activities                   Pertinent Vitals/ Pain       Pain Assessment: Faces Faces Pain Scale: Hurts a little bit Pain Location: generalized Pain Descriptors / Indicators: Discomfort;Grimacing;Guarding;Moaning Pain  Intervention(s): Limited activity within patient's tolerance;Monitored during session;Repositioned   Frequency  Min 2X/week        Progress Toward Goals  OT Goals(current goals can now be found in the care plan section)  Progress towards OT goals: Progressing toward goals  Acute Rehab OT Goals Patient Stated Goal: to get into chair Time For Goal Achievement: 01/02/21 Potential to Achieve Goals: Fair  Plan Discharge plan remains appropriate;Frequency remains appropriate       AM-PAC OT "6 Clicks" Daily Activity     Outcome Measure   Help from another person eating meals?: Total Help from another person taking care of personal grooming?: A Little Help from another person toileting, which includes using toliet, bedpan, or urinal?: Total Help from another person bathing (including washing, rinsing, drying)?: A Lot Help from another person to put on and taking off  regular upper body clothing?: A Little Help from another person to put on and taking off regular lower body clothing?: A Lot 6 Click Score: 12    End of Session Equipment Utilized During Treatment: Oxygen  OT Visit Diagnosis: Unsteadiness on feet (R26.81);Muscle weakness (generalized) (M62.81);History of falling (Z91.81)   Activity Tolerance Patient limited by fatigue;Patient limited by lethargy   Patient Left in bed;with call bell/phone within reach;with bed alarm set   Nurse Communication Mobility status;Other (comment) (on bed pan)        Time: 3845-3646 OT Time Calculation (min): 26 min  Charges: OT General Charges $OT Visit: 1 Visit OT Treatments $Self Care/Home Management : 23-37 mins  Jackquline Denmark, MS, OTR/L , CBIS ascom [phone]  12/27/20, 1:36 PM

## 2020-12-27 NOTE — Progress Notes (Signed)
PROGRESS NOTE  [name] FXT:[id number] DOB: [birth year] DOA: 12/17/2020 PCP: Eunice Blase, PA-C  Brief History   [name] is a 45 y.o. female with medical history significant for Bipolar disorder, diabetes, alcohol use disorder and polysubstance abuse, FSH muscular dystrophy, with history of falls, last seen by neurology in January 2020, who was brought in by EMS after she fell at home 2 days prior and was unable to get up.   While in ED, patient was found to have severe hyponatremia of 113, potassium 2.9.   She also has an ammonia of 52, bilirubin of 11.9. She was given potassium, she was also placed on normal saline. Her chest x-ray also showed a right lower lobe infiltrates, probable aspiration pneumonia.  She is started on antibiotics with Unasyn.   The patient remains jaundiced. She appears to be in hepatic encephalopathy, although her parents at bedside stated that she was awake earlier in the day.  The patient underwent MRI abdomen/liver. No ductal dilatation or obstruction seen. Marked hepatomegaly and steatosis. No evidence of portal hypertension. No acute biliary process. It also demonstrated basilar effusions and bibasilar airspace disease right greater than left.  During my visit with the patient this morning, she was much more alert and communicative. She was agreeable to increasing her activity and getting up to chair. However, I have just been contacted by the patient's nurse and told that she is now lethargic again. I have reduced her lyrica from 150 mg to 75 mg.   Edema of the patient's lower extremities appears to be worsening.   Consultants  Gastroenterology Nephrology  Procedures  None  Antibiotics   Anti-infectives (From admission, onward)    Start     Dose/Rate Route Frequency Ordered Stop   12/22/20 2200  linezolid (ZYVOX) tablet 600 mg  Status:  Discontinued        600 mg Oral Every 12 hours 12/22/20 1143 12/27/20 1213   12/20/20 2200  vancomycin  (VANCOCIN) IVPB 1000 mg/200 mL premix  Status:  Discontinued        1,000 mg 200 mL/hr over 60 Minutes Intravenous Every 12 hours 12/20/20 0839 12/22/20 1143   12/20/20 0930  vancomycin (VANCOCIN) 2,000 mg in sodium chloride 0.9 % 500 mL IVPB  Status:  Discontinued        2,000 mg 250 mL/hr over 120 Minutes Intravenous  Once 12/20/20 0839 12/20/20 0907   12/20/20 0930  vancomycin (VANCOCIN) 2,000 mg in sodium chloride 0.9 % 500 mL IVPB        2,000 mg 250 mL/hr over 120 Minutes Intravenous  Once 12/20/20 0907 12/20/20 1223   12/19/20 1630  rifaximin (XIFAXAN) tablet 550 mg        550 mg Oral 2 times daily 12/19/20 1533     12/18/20 1200  Ampicillin-Sulbactam (UNASYN) 3 g in sodium chloride 0.9 % 100 mL IVPB  Status:  Discontinued        3 g 200 mL/hr over 30 Minutes Intravenous Every 6 hours 12/18/20 1038 12/20/20 0839   12/18/20 0045  Ampicillin-Sulbactam (UNASYN) 3 g in sodium chloride 0.9 % 100 mL IVPB        3 g 200 mL/hr over 30 Minutes Intravenous  Once 12/18/20 0033 12/18/20 0303      Subjective  The patient is awake and alert. No acute distress.  Objective   Vitals:  Vitals:   12/27/20 0750 12/27/20 1100  BP: 103/68 103/70  Pulse: (!) 106 (!) 106  Resp:  17 16  Temp: 98.7 F (37.1 C) 98 F (36.7 C)  SpO2: 100% 100%    Exam:  Constitutional:  The patient is awake, alert, and oriented x 3 during my visit.  Eyes:  pupils and irises appear normal Sclera remain icteric, but clearing. Respiratory:  CTA bilaterally, no w/r/r.  Respiratory effort normal. No retractions or accessory muscle use Cardiovascular:  RRR, no m/r/g 2+ pitting edema of lower extremities bilateral Normal pedal pulses Abdomen:  Abdomen appears normal; no tenderness or masses No hernias No HSM Musculoskeletal:  Digits/nails BUE: no clubbing, cyanosis, petechiae, infection exam of joints, bones, muscles of at least one of following: head/neck, RUE, LUE, RLE, LLE   Skin:  No rashes,  lesions, ulcers palpation of skin: no induration or nodules Less jaundiced than yesterday Neurologic:  The patient is awake and alert. She is oriented x 3. Moving all extremities. CN II-XII grossly intact. Psychiatric:  Pt seems depressed. Flat affect. Poor insight and judgement.   I have personally reviewed the following:   Today's Data  Vitals  Lab Data  CMP, CBC  Micro Data  Blood culture x 2 No growth MRSA by PCR is positive  Imaging  CT head US abdomen MRI liver/abdomen  Cardiology Data  EKG  Scheduled Meds:  budesonide  9 mg Oral Daily   enoxaparin (LOVENOX) injection  0.5 mg/kg Subcutaneous Q24H   feeding supplement  237 mL Oral BID BM   FLUoxetine  80 mg Oral Daily   fluticasone  1 spray Each Nare Daily   folic acid  1 mg Oral Daily   insulin aspart  0-20 Units Subcutaneous TID WC   insulin aspart  0-5 Units Subcutaneous QHS   insulin glargine-yfgn  8 Units Subcutaneous QHS   lactulose  20 g Oral TID   multivitamin with minerals  1 tablet Oral Daily   OLANZapine  5 mg Oral Daily   pantoprazole (PROTONIX) IV  40 mg Intravenous Q12H   pregabalin  75 mg Oral TID   rifaximin  550 mg Oral BID   thiamine  100 mg Oral Daily   Continuous Infusions:  sodium chloride Stopped (12/18/20 2305)   sodium chloride 75 mL/hr at 12/27/20 1221    Principal Problem:   Sepsis ([name]) Active Problems:   Alcohol withdrawal ([name])   Bipolar 2 disorder ([name])   Hypokalemia   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[name])   Acute hyponatremia   Alcohol use disorder, moderate, dependence ([name])   Hepatic steatosis   Polysubstance abuse ([name])   Hyperglycemia due to type 2 diabetes mellitus ([name])   Hyperbilirubinemia   Lactic acidosis   Alcoholic liver disease ([name])   Acute hepatic encephalopathy   Pressure injury of skin   LOS: 9 days   A & P  Sepsis ([name]) Resolved. Present on admission. Due to aspiration pneumonia, on IV vanco. MRSA screen +. The patient has  converted to linezolid in preparation for dc.   Acute hepatic encephalopathy During my visit with the patient this morning, she was much more alert and communicative. She was agreeable to increasing her activity and getting up to chair. However, I have just been contacted by the patient's nurse and told that she is now lethargic again. I have reduced her lyrica from 150 mg to 75 mg. She is now receiving lactulose 20 gm tid. Continue Xifaxan. OOB and PT/OT.  Peripheral Edema: Likely due to Cirrhosis, although the patient did not have portal hypertension on MRI of abdomen. Echocardiogram ordered.  Also placed order for lasix.   Hyperbilirubinemia GI following.  Pending hepatitis panel, likely due to ischemic hepatitis.  Normal doppler liver.  MRI abdomen obtained. No biliary obstruction/distention seen. Jaundice slowly clearing. Bilirubin has decreased from 16.9 to 13.3. Monitor.   Hepatic steatosis/hepatomegaly Needs to stop alcohol. Mother wants for her to go for inpatient alcohol rehab again. I have discussed this with TOC. MRI demonstrated cirrhosis of the liver without portal hypertension or biliary duct obstruction.    Alcohol use disorder, moderate, dependence/Poly substance abuse([name]) Patient is hoping to quit drinking. She will need inpatient rehab, but this will likely have to wait until after she discharged from SNF.   Acute hyponatremia Resolved. Monitor.   Hyperkalemia IV NS with 20 mEq K discontinued. Start NS at 75 cc/hr   Alcohol withdrawal ([name]) CIWA protocol   Euthyroid sick syndrome Elevated TSH, normal free T4  I have seen and examined this patient myself. I have spent 34 minutes in her evaluation and care.  DVT prophylaxis: Lovenox Code Status: Full Code Family Communication: None available Disposition Plan: SNF   Lilian Fuhs, DO [facility] Direct contact: see [email or web address]  7PM-7AM contact night coverage as above 12/27/2020, 5:41 PM  LOS: 3 days

## 2020-12-27 NOTE — Progress Notes (Signed)
Melodie Bouillon, MD [address], [place], [place], [address] [address], [place], [place], [address] Phone: [phone]  Fax: [phone]   Subjective: Patient resting in bed comfortably.  Is jaundiced.  Denies any complaints   Objective: Exam: Vital signs in last 24 hours: Vitals:   12/27/20 0441 12/27/20 0500 12/27/20 0750 12/27/20 1100  BP: 112/76  103/68 103/70  Pulse: (!) 109  (!) 106 (!) 106  Resp: 18  17 16   Temp: 98.5 F (36.9 C)  98.7 F (37.1 C) 98 F (36.7 C)  TempSrc: Oral     SpO2: 92%  100% 100%  Weight:  113.8 kg    Height:       Weight change:   Intake/Output Summary (Last 24 hours) at 12/27/2020 1330 Last data filed at 12/27/2020 1221 Gross per 24 hour  Intake 4899.69 ml  Output 400 ml  Net 4499.69 ml    Constitutional: General:   Alert,  Well-developed, well-nourished, pleasant and cooperative in NAD BP 103/70 (BP Location: Right Arm)   Pulse (!) 106   Temp 98 F (36.7 C)   Resp 16   Ht 5\' 7"  (1.702 m)   Wt 113.8 kg   LMP  (LMP Unknown)   SpO2 100%   BMI 39.29 kg/m   Eyes:  Scleral icterus.   Conjunctiva pink.   Ears:  No scars, lesions or masses, Normal auditory acuity. Nose:  No deformity, discharge, or lesions. Mouth:  No deformity or lesions, oropharynx pink & moist.  Neck:  Supple; no masses, trachea midline  Respiratory: Normal respiratory effort  Gastrointestinal:  Soft, non-tender and non-distended without masses, hepatosplenomegaly or hernias noted.  No guarding or rebound tenderness.     Cardiac: No clubbing or edema.  No cyanosis. Normal posterior tibial pedal pulses noted.  Lymphatic:  No significant cervical adenopathy.  Psych:  Alert and cooperative. Normal mood and affect.  Musculoskeletal:  Head normocephalic, atraumatic. 5/5 Lower extremity strength bilaterally.  Skin: Warm. Intact without significant lesions or rashes. jaundice.  Neurologic:  Face symmetrical, tongue midline,  Normal sensation to touch  Psych:  Alert and oriented x3, Alert and cooperative. Normal mood and affect.   Lab Results: Lab Results  Component Value Date   WBC 16.0 (H) 12/25/2020   HGB 9.9 (L) 12/25/2020   HCT 32.3 (L) 12/25/2020   MCV 109.9 (H) 12/25/2020   PLT 150 12/25/2020   Micro Results: Recent Results (from the past 240 hour(s))  Resp Panel by RT-PCR (Flu A&B, Covid) Nasopharyngeal Swab     Status: None   Collection Time: 12/18/20  1:16 AM   Specimen: Nasopharyngeal Swab; Nasopharyngeal(NP) swabs in vial transport medium  Result Value Ref Range Status   SARS Coronavirus 2 by RT PCR NEGATIVE NEGATIVE Final    Comment: (NOTE) SARS-CoV-2 target nucleic acids are NOT DETECTED.  The SARS-CoV-2 RNA is generally detectable in upper respiratory specimens during the acute phase of infection. The lowest concentration of SARS-CoV-2 viral copies this assay can detect is 138 copies/mL. A negative result does not preclude SARS-Cov-2 infection and should not be used as the sole basis for treatment or other patient management decisions. A negative result may occur with  improper specimen collection/handling, submission of specimen other than nasopharyngeal swab, presence of viral mutation(s) within the areas targeted by this assay, and inadequate number of viral copies(<138 copies/mL). A negative result must be combined with clinical observations, patient history, and epidemiological information. The expected result is Negative.  Fact Sheet for Patients:  BloggerCourse.com  Fact Sheet for Healthcare Providers:  SeriousBroker.it  This test is no t yet approved or cleared by the [place] FDA and  has been authorized for detection and/or diagnosis of SARS-CoV-2 by FDA under an Emergency Use Authorization (EUA). This EUA will remain  in effect (meaning this test can be used) for the duration of the COVID-19 declaration under  Section 564(b)(1) of the Act, 21 U.S.C.section 360bbb-3(b)(1), unless the authorization is terminated  or revoked sooner.       Influenza A by PCR NEGATIVE NEGATIVE Final   Influenza B by PCR NEGATIVE NEGATIVE Final    Comment: (NOTE) The Xpert Xpress SARS-CoV-2/FLU/RSV plus assay is intended as an aid in the diagnosis of influenza from Nasopharyngeal swab specimens and should not be used as a sole basis for treatment. Nasal washings and aspirates are unacceptable for Xpert Xpress SARS-CoV-2/FLU/RSV testing.  Fact Sheet for Patients: BloggerCourse.com  Fact Sheet for Healthcare Providers: SeriousBroker.it  This test is not yet approved or cleared by the [place] FDA and has been authorized for detection and/or diagnosis of SARS-CoV-2 by FDA under an Emergency Use Authorization (EUA). This EUA will remain in effect (meaning this test can be used) for the duration of the COVID-19 declaration under Section 564(b)(1) of the Act, 21 U.S.C. section 360bbb-3(b)(1), unless the authorization is terminated or revoked.  Performed at [facility], [address] [place], [place] [address]   Blood culture (routine x 2)     Status: None   Collection Time: 12/18/20  1:16 AM   Specimen: BLOOD  Result Value Ref Range Status   Specimen Description BLOOD RIGHT HAND  Final   Special Requests   Final    BOTTLES DRAWN AEROBIC AND ANAEROBIC Blood Culture results may not be optimal due to an inadequate volume of blood received in culture bottles   Culture   Final    NO GROWTH 5 DAYS Performed at [facility], [address], [place], [place] [address]    Report Status 12/23/2020 FINAL  Final  Blood culture (routine x 2)     Status: None   Collection Time: 12/18/20  1:16 AM   Specimen: BLOOD  Result Value Ref Range Status   Specimen Description BLOOD RIGHT ASSIST CONTROL  Final   Special Requests   Final    BOTTLES  DRAWN AEROBIC AND ANAEROBIC Blood Culture adequate volume   Culture   Final    NO GROWTH 5 DAYS Performed at [facility], [address] [place], [place] [address]    Report Status 12/23/2020 FINAL  Final  Varicella-zoster by PCR (Blood or Swab)     Status: None   Collection Time: 12/19/20  3:47 PM   Specimen: Blood  Result Value Ref Range Status   Varicella-Zoster, PCR Negative Negative Final    Comment: (NOTE) No Varicella Zoster Virus DNA detected. This test was developed and its performance characteristics determined by [facility].  It has not been cleared or approved by the Food and Drug Administration.  The FDA has determined that such clearance or approval is not necessary. Performed At: [facility] [address] [place], [place] [id number] Jolene Schimke MD [id number]   MRSA Next Gen by PCR, Nasal     Status: Abnormal   Collection Time: 12/19/20  4:04 PM   Specimen: Nasal Mucosa; Nasal Swab  Result Value Ref Range Status   MRSA by PCR Next Gen DETECTED (A) NOT DETECTED Final  Comment: RESULT CALLED TO, READ BACK BY AND VERIFIED WITH: AMY DOLTON @1830  ON 12/19/20 SKL (NOTE) The GeneXpert MRSA Assay (FDA approved for NASAL specimens only), is one component of a comprehensive MRSA colonization surveillance program. It is not intended to diagnose MRSA infection nor to guide or monitor treatment for MRSA infections. Test performance is not FDA approved in patients less than 15 years old. Performed at [facility], [address] [place], [place]    Studies/Results: No results found. Medications:  Scheduled Meds:  budesonide  9 mg Oral Daily   enoxaparin (LOVENOX) injection  0.5 mg/kg Subcutaneous Q24H   feeding supplement  237 mL Oral BID BM   FLUoxetine  80 mg Oral Daily   fluticasone  1 spray Each Nare Daily   folic acid  1 mg Oral Daily   insulin aspart  0-20 Units Subcutaneous TID WC   insulin aspart  0-5 Units  Subcutaneous QHS   insulin glargine-yfgn  8 Units Subcutaneous QHS   lactulose  20 g Oral TID   multivitamin with minerals  1 tablet Oral Daily   OLANZapine  5 mg Oral Daily   pantoprazole (PROTONIX) IV  40 mg Intravenous Q12H   pregabalin  75 mg Oral TID   rifaximin  550 mg Oral BID   thiamine  100 mg Oral Daily   Continuous Infusions:  sodium chloride Stopped (12/18/20 2305)   sodium chloride 75 mL/hr at 12/27/20 1221   PRN Meds:.sodium chloride, albuterol, albuterol, methocarbamol, ondansetron **OR** ondansetron (ZOFRAN) IV   Assessment: Principal Problem:   Sepsis ([name]) Active Problems:   Alcohol withdrawal ([name])   Bipolar 2 disorder ([name])   Hypokalemia   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[name])   Acute hyponatremia   Alcohol use disorder, moderate, dependence ([name])   Hepatic steatosis   Polysubstance abuse ([name])   Hyperglycemia due to type 2 diabetes mellitus ([name])   Hyperbilirubinemia   Lactic acidosis   Alcoholic liver disease ([name])   Acute hepatic encephalopathy   Pressure injury of skin    Plan: Liver enzymes had a drawn yesterday show improvement overall Continue to avoid hepatotoxic drugs Continue monitoring CMP Patient will need outpatient follow-up to repeat hepatitis B labs and monitor liver enzymes Continue abstinence  GI service will sign off, please page with any questions or concerns   LOS: 9 days   02/26/21, MD 12/27/2020, 1:30 PM

## 2020-12-27 NOTE — Care Management Important Message (Signed)
Important Message  Patient Details  Name: [name] MRN: [id number] Date of Birth: Jun 29, 1975   Medicare Important Message Given:  Yes     [name] 12/27/2020, 3:55 PM

## 2020-12-28 ENCOUNTER — Inpatient Hospital Stay: Payer: Medicare Other

## 2020-12-28 DIAGNOSIS — K7200 Acute and subacute hepatic failure without coma: Secondary | ICD-10-CM | POA: Diagnosis not present

## 2020-12-28 DIAGNOSIS — A419 Sepsis, unspecified organism: Secondary | ICD-10-CM | POA: Diagnosis not present

## 2020-12-28 DIAGNOSIS — R6520 Severe sepsis without septic shock: Secondary | ICD-10-CM | POA: Diagnosis not present

## 2020-12-28 LAB — PROCALCITONIN: Procalcitonin: 1.57 ng/mL

## 2020-12-28 LAB — COMPREHENSIVE METABOLIC PANEL
ALT: 29 U/L (ref 0–44)
AST: 160 U/L — ABNORMAL HIGH (ref 15–41)
Albumin: <1.5 g/dL — ABNORMAL LOW (ref 3.5–5.0)
Alkaline Phosphatase: 184 U/L — ABNORMAL HIGH (ref 38–126)
Anion gap: 6 (ref 5–15)
BUN: 23 mg/dL — ABNORMAL HIGH (ref 6–20)
CO2: 28 mmol/L (ref 22–32)
Calcium: 8.1 mg/dL — ABNORMAL LOW (ref 8.9–10.3)
Chloride: 105 mmol/L (ref 98–111)
Creatinine, Ser: 0.71 mg/dL (ref 0.44–1.00)
GFR, Estimated: >60 mL/min (ref >60)
Glucose, Bld: 253 mg/dL — ABNORMAL HIGH (ref 70–99)
Potassium: 3.9 mmol/L (ref 3.5–5.1)
Sodium: 139 mmol/L (ref 135–145)
Total Bilirubin: 10.4 mg/dL — ABNORMAL HIGH (ref 0.3–1.2)
Total Protein: 6.4 g/dL — ABNORMAL LOW (ref 6.5–8.1)

## 2020-12-28 LAB — PHOSPHORUS: Phosphorus: 3.2 mg/dL (ref 2.5–4.6)

## 2020-12-28 LAB — CBC
HCT: 29.1 % — ABNORMAL LOW (ref 36.0–46.0)
Hemoglobin: 8.5 g/dL — ABNORMAL LOW (ref 12.0–15.0)
MCH: 34.4 pg — ABNORMAL HIGH (ref 26.0–34.0)
MCHC: 29.2 g/dL — ABNORMAL LOW (ref 30.0–36.0)
MCV: 117.8 fL — ABNORMAL HIGH (ref 80.0–100.0)
Platelets: 165 10*3/uL (ref 150–400)
RBC: 2.47 MIL/uL — ABNORMAL LOW (ref 3.87–5.11)
RDW: 23.5 % — ABNORMAL HIGH (ref 11.5–15.5)
WBC: 18.1 10*3/uL — ABNORMAL HIGH (ref 4.0–10.5)
nRBC: 0.2 % (ref 0.0–0.2)

## 2020-12-28 LAB — VITAMIN D 25 HYDROXY (VIT D DEFICIENCY, FRACTURES): Vit D, 25-Hydroxy: 16.96 ng/mL — ABNORMAL LOW (ref 30–100)

## 2020-12-28 LAB — GLUCOSE, CAPILLARY
Glucose-Capillary: 254 mg/dL — ABNORMAL HIGH (ref 70–99)
Glucose-Capillary: 227 mg/dL — ABNORMAL HIGH (ref 70–99)
Glucose-Capillary: 148 mg/dL — ABNORMAL HIGH (ref 70–99)
Glucose-Capillary: 173 mg/dL — ABNORMAL HIGH (ref 70–99)

## 2020-12-28 LAB — FOLATE: Folate: 8.5 ng/mL (ref >5.9)

## 2020-12-28 LAB — IRON AND TIBC
Iron: 108 ug/dL (ref 28–170)
Saturation Ratios: 75 % — ABNORMAL HIGH (ref 10.4–31.8)
TIBC: 144 ug/dL — ABNORMAL LOW (ref 250–450)
UIBC: 36 ug/dL

## 2020-12-28 LAB — VITAMIN B12: Vitamin B-12: 885 pg/mL (ref 180–914)

## 2020-12-28 LAB — MAGNESIUM: Magnesium: 1.7 mg/dL (ref 1.7–2.4)

## 2020-12-28 MED ORDER — INSULIN GLARGINE-YFGN 100 UNIT/ML ~~LOC~~ SOLN
14.0000 [IU] | Freq: Every day | SUBCUTANEOUS | Status: DC
  Administered 2020-12-28 – 2021-01-05 (×9): 14 [IU] via SUBCUTANEOUS
  Filled 2020-12-28 (×12): qty 0.14

## 2020-12-28 MED ORDER — FUROSEMIDE 10 MG/ML IJ SOLN
40.0000 mg | Freq: Two times a day (BID) | INTRAMUSCULAR | Status: DC
  Administered 2020-12-28 – 2021-01-06 (×18): 40 mg via INTRAVENOUS
  Filled 2020-12-28 (×18): qty 4

## 2020-12-28 MED ORDER — INSULIN ASPART 100 UNIT/ML IJ SOLN
3.0000 [IU] | Freq: Three times a day (TID) | INTRAMUSCULAR | Status: DC
  Administered 2020-12-28 – 2021-01-06 (×29): 3 [IU] via SUBCUTANEOUS
  Filled 2020-12-28 (×22): qty 1

## 2020-12-28 NOTE — Progress Notes (Signed)
OT Cancellation Note  Patient Details Name: [name] MRN: [id number] DOB: 11-28-1975   Cancelled Treatment:    Reason Eval/Treat Not Completed: Fatigue/lethargy limiting ability to participate  [place] [facility] 12/28/2020, 2:49 PM

## 2020-12-28 NOTE — TOC Progression Note (Signed)
Transition of Care [facility]) - Progression Note    Patient Details  Name: [name] MRN: [id number] Date of Birth: 10/18/1975  [facility]) CM/SW Contact  Gildardo Griffes, [place] Phone Number: 12/28/2020, 12:35 PM  Clinical Narrative:     CSW notes plan for patient to maximize therapy services here with hopes of gaining strength to go home with home health.   Currenly patient has no bed offers, barriers include alcohol and polysubstance abuse disorders with active use which hinder placement/bed offers for SNF.   Expected Discharge Plan: Skilled Nursing Facility Barriers to Discharge: Continued Medical Work up  Expected Discharge Plan and Services Expected Discharge Plan: Skilled Nursing Facility     Post Acute Care Choice: Skilled Nursing Facility Living arrangements for the past 2 months: Single Family Home                                       Social Determinants of Health (SDOH) Interventions    Readmission Risk Interventions No flowsheet data found.

## 2020-12-28 NOTE — Progress Notes (Addendum)
Inpatient Diabetes Program Recommendations  AACE/ADA: New Consensus Statement on Inpatient Glycemic Control (2015)  Target Ranges:  Prepandial:   less than 140 mg/dL      Peak postprandial:   less than 180 mg/dL (1-2 hours)      Critically ill patients:  140 - 180 mg/dL  Results for [name], [name] (MRN [id number]) as of 12/28/2020 08:10  Ref. Range 12/27/2020 08:24 12/27/2020 11:50 12/27/2020 17:35 12/27/2020 21:10  Glucose-Capillary Latest Ref Range: 70 - 99 mg/dL 062 (H)  7 units Novolog  228 (H)  7 units Novolog  244 (H)  7 units Novolog  219 (H)  2 units Novolog  8 units Semglee  Results for [name], [name] (MRN [id number]) as of 12/28/2020 08:10  Ref. Range 12/28/2020 07:58  Glucose-Capillary Latest Ref Range: 70 - 99 mg/dL 035 (H)    Home DM Meds: Janumet XR 100/1000 mg Daily                             Metformin 500 mg BID                             Ozempic Qweek   Current Orders: Novolog Resistant Correction Scale/ SSI (0-20 units) TID AC + HS     Semglee 8 units QHS     Budesonide 9 mg Daily      MD- Note AM CBGs remain elevated.  Also having elevated post-meal CBGs.  Eating 80-90% of meals.  Please consider:  1. Increase Semglee to 14 units QHS  2. Start Novolog Meal Coverage: Novolog 3 units TID with meals Hold if pt eats <50% of meal, Hold if pt NPO  3. At discharge, will need Metformin discontinued due to history of liver disease (note that there is metformin in Janumet).   If stopping Janumet, could prescribe Januvia by itself.    --Will follow patient during hospitalization--  Ambrose Finland RN, MSN, CDE Diabetes Coordinator Inpatient Glycemic Control Team Team Pager: [phone] (8a-5p)

## 2020-12-28 NOTE — Progress Notes (Signed)
PROGRESS NOTE  [name] ZOX:[id number] DOB: February 20, 1976 DOA: 12/17/2020 PCP: Eunice Blase, PA-C  Brief History   [name] is a 45 y.o. female with medical history significant for Bipolar disorder, diabetes, alcohol use disorder and polysubstance abuse, FSH muscular dystrophy, with history of falls, last seen by neurology in January 2020, who was brought in by EMS after she fell at home 2 days prior and was unable to get up.   While in ED, patient was found to have severe hyponatremia of 113, potassium 2.9.   She also has an ammonia of 52, bilirubin of 11.9. She was given potassium, she was also placed on normal saline. Her chest x-ray also showed a right lower lobe infiltrates, probable aspiration pneumonia.  She is started on antibiotics with Unasyn.   The patient remains jaundiced. She appears to be in hepatic encephalopathy, although her parents at bedside stated that she was awake earlier in the day.  The patient underwent MRI abdomen/liver. No ductal dilatation or obstruction seen. Marked hepatomegaly and steatosis. No evidence of portal hypertension. No acute biliary process. It also demonstrated basilar effusions and bibasilar airspace disease right greater than left.  During my visit with the patient this morning, she was much more alert and communicative. She was agreeable to increasing her activity and getting up to chair. However, I have just been contacted by the patient's nurse and told that she is now lethargic again. I have reduced her lyrica from 150 mg to 75 mg.   Edema of the patient's lower extremities appears to be worsening.   10/12 Patient was sitting on the chair, family was at bedside, patient still has mild shortness of breath and ascites and lower extremity edema.   Consultants  Gastroenterology Nephrology  Procedures  None  Antibiotics   Anti-infectives (From admission, onward)    Start     Dose/Rate Route Frequency Ordered Stop   12/22/20 2200   linezolid (ZYVOX) tablet 600 mg  Status:  Discontinued        600 mg Oral Every 12 hours 12/22/20 1143 12/27/20 1213   12/20/20 2200  vancomycin (VANCOCIN) IVPB 1000 mg/200 mL premix  Status:  Discontinued        1,000 mg 200 mL/hr over 60 Minutes Intravenous Every 12 hours 12/20/20 0839 12/22/20 1143   12/20/20 0930  vancomycin (VANCOCIN) 2,000 mg in sodium chloride 0.9 % 500 mL IVPB  Status:  Discontinued        2,000 mg 250 mL/hr over 120 Minutes Intravenous  Once 12/20/20 0839 12/20/20 0907   12/20/20 0930  vancomycin (VANCOCIN) 2,000 mg in sodium chloride 0.9 % 500 mL IVPB        2,000 mg 250 mL/hr over 120 Minutes Intravenous  Once 12/20/20 0907 12/20/20 1223   12/19/20 1630  rifaximin (XIFAXAN) tablet 550 mg        550 mg Oral 2 times daily 12/19/20 1533     12/18/20 1200  Ampicillin-Sulbactam (UNASYN) 3 g in sodium chloride 0.9 % 100 mL IVPB  Status:  Discontinued        3 g 200 mL/hr over 30 Minutes Intravenous Every 6 hours 12/18/20 1038 12/20/20 0839   12/18/20 0045  Ampicillin-Sulbactam (UNASYN) 3 g in sodium chloride 0.9 % 100 mL IVPB        3 g 200 mL/hr over 30 Minutes Intravenous  Once 12/18/20 0033 12/18/20 0303      Subjective  The patient is awake and alert. No acute  distress.  Objective   Vitals:  Vitals:   12/28/20 1141 12/28/20 1433  BP: 108/79 111/74  Pulse: 100 96  Resp: 18 18  Temp: 97.8 F (36.6 C) 97.9 F (36.6 C)  SpO2: 99% 94%    Exam:  Constitutional:  The patient is awake, alert, and oriented x 3 during my visit.  Eyes:  pupils and irises appear normal Sclera remain icteric, but clearing. Respiratory:  CTA bilaterally, no w/r/r.  Respiratory effort normal. No retractions or accessory muscle use Cardiovascular:  RRR, no m/r/g 4+ pitting edema of lower extremities bilateral Normal pedal pulses Abdomen:  Obese and distended, no tenderness or masses No hernias No HSM Musculoskeletal:  Digits/nails BUE: no clubbing, cyanosis,  petechiae, infection exam of joints, bones, muscles of at least one of following: head/neck, RUE, LUE, RLE, LLE   Skin:  No rashes, lesions, ulcers palpation of skin: no induration or nodules Less jaundiced than yesterday Neurologic:  The patient is awake and alert. She is oriented x 3. Moving all extremities. CN II-XII grossly intact. Psychiatric:  Pt seems depressed. Flat affect. Poor insight and judgement.   I have personally reviewed the following:   Today's Data  Vitals  Lab Data  CMP, CBC  Micro Data  Blood culture x 2 No growth MRSA by PCR is positive  Imaging  CT head US abdomen MRI liver/abdomen  Cardiology Data  EKG  Scheduled Meds:  budesonide  9 mg Oral Daily   enoxaparin (LOVENOX) injection  0.5 mg/kg Subcutaneous Q24H   feeding supplement  237 mL Oral BID BM   FLUoxetine  80 mg Oral Daily   fluticasone  1 spray Each Nare Daily   folic acid  1 mg Oral Daily   insulin aspart  0-20 Units Subcutaneous TID WC   insulin aspart  0-5 Units Subcutaneous QHS   insulin aspart  3 Units Subcutaneous TID WC   insulin glargine-yfgn  14 Units Subcutaneous QHS   lactulose  20 g Oral TID   multivitamin with minerals  1 tablet Oral Daily   OLANZapine  5 mg Oral Daily   pantoprazole (PROTONIX) IV  40 mg Intravenous Q12H   pregabalin  75 mg Oral TID   rifaximin  550 mg Oral BID   thiamine  100 mg Oral Daily   Continuous Infusions:  sodium chloride Stopped (12/18/20 2305)   sodium chloride 75 mL/hr at 12/28/20 0936    Principal Problem:   Sepsis ([name]) Active Problems:   Alcohol withdrawal ([name])   Bipolar 2 disorder ([name])   Hypokalemia   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[name])   Acute hyponatremia   Alcohol use disorder, moderate, dependence ([name])   Hepatic steatosis   Polysubstance abuse ([name])   Hyperglycemia due to type 2 diabetes mellitus ([name])   Hyperbilirubinemia   Lactic acidosis   Alcoholic liver disease ([name])   Acute hepatic  encephalopathy   Pressure injury of skin   LOS: 10 days   A & P  Sepsis ([name]) Resolved. Present on admission. Due to aspiration pneumonia, on IV vanco. MRSA screen +. The patient has converted to linezolid in preparation for dc.   Acute hepatic encephalopathy During my visit with the patient this morning, she was much more alert and communicative. She was agreeable to increasing her activity and getting up to chair. However,  reduced lyrica from 150 mg to 75 mg.  lactulose 20 gm tid. Continue Xifaxan. OOB and PT/OT.   Peripheral Edema: Likely due to Cirrhosis, although  the patient did not have portal hypertension on MRI of abdomen. Echocardiogram ordered.    Acute hypoxic respiratory failure Continue supplemental O2 inhalation and gradually wean off Continue inhalers as needed Follow chest x-ray Leukocytosis, unknown cause, procalcitonin negative, monitor off antibiotics for now Trend WBC count  Hyperbilirubinemia GI following.  Pending hepatitis panel, likely due to ischemic hepatitis.  Normal doppler liver.  MRI abdomen obtained. No biliary obstruction/distention seen. Jaundice slowly clearing. Bilirubin has decreased from 16.9 to 13.3. Monitor.   Hepatic steatosis/hepatomegaly Needs to stop alcohol. Mother wants for her to go for inpatient alcohol rehab again. I have discussed this with TOC. MRI demonstrated cirrhosis of the liver without portal hypertension or biliary duct obstruction.    Alcohol use disorder, moderate, dependence/Poly substance abuse([name]) Patient is hoping to quit drinking. She will need inpatient rehab, but this will likely have to wait until after she discharged from SNF.   Acute hyponatremia Resolved. Monitor.   Hyperkalemia IV NS with 20 mEq K discontinued. Start NS at 75 cc/hr   Alcohol withdrawal ([name]) CIWA protocol   Euthyroid sick syndrome Elevated TSH, normal free T4  I have seen and examined this patient myself. I have spent 34 minutes in  her evaluation and care.  DVT prophylaxis: Lovenox Code Status: Full Code Family Communication: None available Disposition Plan: SNF   Maloni Musleh Lucianne Muss [facility] Direct contact: see [email or web address]  7PM-7AM contact night coverage as above

## 2020-12-28 NOTE — Progress Notes (Signed)
PT Cancellation Note  Patient Details Name: SHANAVIA MAKELA MRN: [id number] DOB: 1975-03-25   Cancelled Treatment:    Reason Eval/Treat Not Completed: Fatigue/lethargy limiting ability to participate. Patient very lethargic. Unable to participate at this time. RN notified. Will return tomorrow.    Millisa Giarrusso 12/28/2020, 2:48 PM

## 2020-12-29 ENCOUNTER — Inpatient Hospital Stay: Payer: Medicare Other

## 2020-12-29 ENCOUNTER — Inpatient Hospital Stay (HOSPITAL_COMMUNITY)
Admission: EM | Admit: 2020-12-29 | Discharge: 2020-12-29 | Disposition: A | Discharge status: Home / Self Care | Payer: Medicare Other | Source: Home / Self Care | Attending: Student | Admitting: Student

## 2020-12-29 DIAGNOSIS — A419 Sepsis, unspecified organism: Secondary | ICD-10-CM | POA: Diagnosis not present

## 2020-12-29 DIAGNOSIS — K7200 Acute and subacute hepatic failure without coma: Secondary | ICD-10-CM | POA: Diagnosis not present

## 2020-12-29 DIAGNOSIS — R0609 Other forms of dyspnea: Secondary | ICD-10-CM | POA: Diagnosis not present

## 2020-12-29 DIAGNOSIS — R6520 Severe sepsis without septic shock: Secondary | ICD-10-CM | POA: Diagnosis not present

## 2020-12-29 LAB — BASIC METABOLIC PANEL
Anion gap: 4 — ABNORMAL LOW (ref 5–15)
BUN: 23 mg/dL — ABNORMAL HIGH (ref 6–20)
CO2: 30 mmol/L (ref 22–32)
Calcium: 8.3 mg/dL — ABNORMAL LOW (ref 8.9–10.3)
Chloride: 105 mmol/L (ref 98–111)
Creatinine, Ser: 0.66 mg/dL (ref 0.44–1.00)
GFR, Estimated: >60 mL/min (ref >60)
Glucose, Bld: 152 mg/dL — ABNORMAL HIGH (ref 70–99)
Potassium: 3.7 mmol/L (ref 3.5–5.1)
Sodium: 139 mmol/L (ref 135–145)

## 2020-12-29 LAB — HEPATIC FUNCTION PANEL
ALT: 32 U/L (ref 0–44)
AST: 176 U/L — ABNORMAL HIGH (ref 15–41)
Albumin: 1.5 g/dL — ABNORMAL LOW (ref 3.5–5.0)
Alkaline Phosphatase: 161 U/L — ABNORMAL HIGH (ref 38–126)
Bilirubin, Direct: 6.5 mg/dL — ABNORMAL HIGH (ref 0.0–0.2)
Indirect Bilirubin: 3.9 mg/dL — ABNORMAL HIGH (ref 0.3–0.9)
Total Bilirubin: 10.4 mg/dL — ABNORMAL HIGH (ref 0.3–1.2)
Total Protein: 6.4 g/dL — ABNORMAL LOW (ref 6.5–8.1)

## 2020-12-29 LAB — CBC
HCT: 27.8 % — ABNORMAL LOW (ref 36.0–46.0)
Hemoglobin: 8.2 g/dL — ABNORMAL LOW (ref 12.0–15.0)
MCH: 35.5 pg — ABNORMAL HIGH (ref 26.0–34.0)
MCHC: 29.5 g/dL — ABNORMAL LOW (ref 30.0–36.0)
MCV: 120.3 fL — ABNORMAL HIGH (ref 80.0–100.0)
Platelets: 127 10*3/uL — ABNORMAL LOW (ref 150–400)
RBC: 2.31 MIL/uL — ABNORMAL LOW (ref 3.87–5.11)
RDW: 23 % — ABNORMAL HIGH (ref 11.5–15.5)
WBC: 17.1 10*3/uL — ABNORMAL HIGH (ref 4.0–10.5)
nRBC: 0.2 % (ref 0.0–0.2)

## 2020-12-29 LAB — GLUCOSE, CAPILLARY
Glucose-Capillary: 158 mg/dL — ABNORMAL HIGH (ref 70–99)
Glucose-Capillary: 182 mg/dL — ABNORMAL HIGH (ref 70–99)
Glucose-Capillary: 214 mg/dL — ABNORMAL HIGH (ref 70–99)
Glucose-Capillary: 176 mg/dL — ABNORMAL HIGH (ref 70–99)

## 2020-12-29 LAB — PHOSPHORUS: Phosphorus: 2.8 mg/dL (ref 2.5–4.6)

## 2020-12-29 LAB — ECHOCARDIOGRAM COMPLETE
Height: 67 in
S' Lateral: 3.1 cm
Weight: 4105.85 oz

## 2020-12-29 LAB — AMMONIA: Ammonia: 41 umol/L — ABNORMAL HIGH (ref 9–35)

## 2020-12-29 LAB — MAGNESIUM: Magnesium: 1.6 mg/dL — ABNORMAL LOW (ref 1.7–2.4)

## 2020-12-29 MED ORDER — MAGNESIUM SULFATE 2 GM/50ML IV SOLN
2.0000 g | Freq: Once | INTRAVENOUS | Status: AC
  Administered 2020-12-29: 2 g via INTRAVENOUS
  Filled 2020-12-29: qty 50

## 2020-12-29 MED ORDER — VITAMIN D (ERGOCALCIFEROL) 1.25 MG (50000 UNIT) PO CAPS
50000.0000 [IU] | ORAL_CAPSULE | ORAL | Status: DC
Start: 2020-12-29 — End: 2021-01-26
  Administered 2020-12-29 – 2021-01-26 (×5): 50000 [IU] via ORAL
  Filled 2020-12-29 (×5): qty 1

## 2020-12-29 MED ORDER — IOHEXOL 350 MG/ML SOLN
75.0000 mL | Freq: Once | INTRAVENOUS | Status: AC | PRN
  Administered 2020-12-29: 75 mL via INTRAVENOUS

## 2020-12-29 MED ORDER — ALPRAZOLAM 0.25 MG PO TABS
0.2500 mg | ORAL_TABLET | Freq: Once | ORAL | Status: AC | PRN
  Administered 2021-01-02: 0.25 mg via ORAL
  Filled 2020-12-29: qty 1

## 2020-12-29 MED ORDER — LORAZEPAM 2 MG/ML IJ SOLN
1.0000 mg | Freq: Once | INTRAMUSCULAR | Status: DC
  Filled 2020-12-29: qty 1

## 2020-12-29 NOTE — Progress Notes (Signed)
Patient's temperature at 1:38 pm was 98.8

## 2020-12-29 NOTE — Progress Notes (Signed)
Patient presented to [place] for Right thoracentesis, ultrasound images taken and findings of trace amount of fluid too small for safe percutaneous access. Imaging reviewed by Dr. Lowella Dandy who agrees and procedure was cancelled.   Pattricia Boss D, PA-C 12/29/2020, 12:00 PM

## 2020-12-29 NOTE — Progress Notes (Signed)
*  PRELIMINARY RESULTS* Echocardiogram 2D Echocardiogram has been performed.  Cristela Blue 12/29/2020, 10:17 AM

## 2020-12-29 NOTE — Progress Notes (Signed)
PROGRESS NOTE  [name] ZOX:[id number] DOB: February 20, 1976 DOA: 12/17/2020 PCP: Eunice Blase, PA-C  Brief History   [name] is a 45 y.o. female with medical history significant for Bipolar disorder, diabetes, alcohol use disorder and polysubstance abuse, FSH muscular dystrophy, with history of falls, last seen by neurology in January 2020, who was brought in by EMS after she fell at home 2 days prior and was unable to get up.   While in ED, patient was found to have severe hyponatremia of 113, potassium 2.9.   She also has an ammonia of 52, bilirubin of 11.9. She was given potassium, she was also placed on normal saline. Her chest x-ray also showed a right lower lobe infiltrates, probable aspiration pneumonia.  She is started on antibiotics with Unasyn.   The patient remains jaundiced. She appears to be in hepatic encephalopathy, although her parents at bedside stated that she was awake earlier in the day.  The patient underwent MRI abdomen/liver. No ductal dilatation or obstruction seen. Marked hepatomegaly and steatosis. No evidence of portal hypertension. No acute biliary process. It also demonstrated basilar effusions and bibasilar airspace disease right greater than left.  During my visit with the patient this morning, she was much more alert and communicative. She was agreeable to increasing her activity and getting up to chair. However, I have just been contacted by the patient's nurse and told that she is now lethargic again. I have reduced her lyrica from 150 mg to 75 mg.   Edema of the patient's lower extremities appears to be worsening.   10/12 Patient was sitting on the chair, family was at bedside, patient still has mild shortness of breath and ascites and lower extremity edema.   Consultants  Gastroenterology Nephrology  Procedures  None  Antibiotics   Anti-infectives (From admission, onward)    Start     Dose/Rate Route Frequency Ordered Stop   12/22/20 2200   linezolid (ZYVOX) tablet 600 mg  Status:  Discontinued        600 mg Oral Every 12 hours 12/22/20 1143 12/27/20 1213   12/20/20 2200  vancomycin (VANCOCIN) IVPB 1000 mg/200 mL premix  Status:  Discontinued        1,000 mg 200 mL/hr over 60 Minutes Intravenous Every 12 hours 12/20/20 0839 12/22/20 1143   12/20/20 0930  vancomycin (VANCOCIN) 2,000 mg in sodium chloride 0.9 % 500 mL IVPB  Status:  Discontinued        2,000 mg 250 mL/hr over 120 Minutes Intravenous  Once 12/20/20 0839 12/20/20 0907   12/20/20 0930  vancomycin (VANCOCIN) 2,000 mg in sodium chloride 0.9 % 500 mL IVPB        2,000 mg 250 mL/hr over 120 Minutes Intravenous  Once 12/20/20 0907 12/20/20 1223   12/19/20 1630  rifaximin (XIFAXAN) tablet 550 mg        550 mg Oral 2 times daily 12/19/20 1533     12/18/20 1200  Ampicillin-Sulbactam (UNASYN) 3 g in sodium chloride 0.9 % 100 mL IVPB  Status:  Discontinued        3 g 200 mL/hr over 30 Minutes Intravenous Every 6 hours 12/18/20 1038 12/20/20 0839   12/18/20 0045  Ampicillin-Sulbactam (UNASYN) 3 g in sodium chloride 0.9 % 100 mL IVPB        3 g 200 mL/hr over 30 Minutes Intravenous  Once 12/18/20 0033 12/18/20 0303      Subjective  The patient is awake and alert. No acute  distress.  Objective   Vitals:  Vitals:   12/29/20 1218 12/29/20 1400  BP: 117/71   Pulse: (!) 115   Resp: 18 17  Temp: 100.2 F (37.9 C) 98.8 F (37.1 C)  SpO2: 93%     Exam:  Constitutional:  The patient is awake, alert, and oriented x 3 during my visit.  Eyes:  pupils and irises appear normal Sclera remain icteric, but clearing. Respiratory:  CTA bilaterally, no w/r/r.  Respiratory effort normal. No retractions or accessory muscle use Cardiovascular:  RRR, no m/r/g 4+ pitting edema of lower extremities bilateral Normal pedal pulses Abdomen:  Obese and distended, no tenderness or masses No hernias No HSM Musculoskeletal:  Digits/nails BUE: no clubbing, cyanosis, petechiae,  infection exam of joints, bones, muscles of at least one of following: head/neck, RUE, LUE, RLE, LLE   Skin:  No rashes, lesions, ulcers palpation of skin: no induration or nodules Less jaundiced than yesterday Neurologic:  The patient is awake and alert. She is oriented x 3. Moving all extremities. CN II-XII grossly intact. Psychiatric:  Pt seems depressed. Flat affect. Poor insight and judgement.   I have personally reviewed the following:   Today's Data  Vitals  Lab Data  CMP, CBC  Micro Data  Blood culture x 2 No growth MRSA by PCR is positive  Imaging  CT head US abdomen MRI liver/abdomen  Cardiology Data  EKG  Scheduled Meds:  budesonide  9 mg Oral Daily   enoxaparin (LOVENOX) injection  0.5 mg/kg Subcutaneous Q24H   feeding supplement  237 mL Oral BID BM   FLUoxetine  80 mg Oral Daily   fluticasone  1 spray Each Nare Daily   folic acid  1 mg Oral Daily   furosemide  40 mg Intravenous BID   insulin aspart  0-20 Units Subcutaneous TID WC   insulin aspart  0-5 Units Subcutaneous QHS   insulin aspart  3 Units Subcutaneous TID WC   insulin glargine-yfgn  14 Units Subcutaneous QHS   lactulose  20 g Oral TID   multivitamin with minerals  1 tablet Oral Daily   OLANZapine  5 mg Oral Daily   pantoprazole (PROTONIX) IV  40 mg Intravenous Q12H   pregabalin  75 mg Oral TID   rifaximin  550 mg Oral BID   thiamine  100 mg Oral Daily   Vitamin D (Ergocalciferol)  50,000 Units Oral Q7 days   Continuous Infusions:  sodium chloride Stopped (12/18/20 2305)   sodium chloride 75 mL/hr at 12/28/20 1738    Principal Problem:   Sepsis ([name]) Active Problems:   Alcohol withdrawal ([name])   Bipolar 2 disorder ([name])   Hypokalemia   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[name])   Acute hyponatremia   Alcohol use disorder, moderate, dependence ([name])   Hepatic steatosis   Polysubstance abuse ([name])   Hyperglycemia due to type 2 diabetes mellitus ([name])    Hyperbilirubinemia   Lactic acidosis   Alcoholic liver disease ([name])   Acute hepatic encephalopathy   Pressure injury of skin   LOS: 11 days   A & P  Sepsis ([name]) Resolved. Present on admission. Due to aspiration pneumonia, on IV vanco. MRSA screen +. The patient has converted to linezolid in preparation for dc.   Acute hepatic encephalopathy Patient's mental status seems to be improving reduced lyrica from 150 mg to 75 mg.   Continue lactulose 20 gm tid. Continue Xifaxan. OOB and PT/OT. Ammonia level 41    Ascites and peripheral  Edema due to liver cirrhosis MRI abdomen did not show portal hypertension  Continue Lasix 40 mg IV twice daily to diurese Patient agreed for paracentesis, ultrasound-guided paracentesis ordered, follow fluid studies to rule out SBP   Severe pulmonary hypertension on TTE TTE shows LVEF 60-65%, RVSP 60 to, severe pulmonary hypertension Follow CTA chest to rule out PE    Acute hypoxic respiratory failure  Continue supplemental O2 inhalation and gradually wean off Continue inhalers as needed Follow chest x-ray Leukocytosis, unknown cause, procalcitonin negative, monitor off antibiotics for now Trend WBC count 10/12 CXR Hypoventilation, Progressive moderately large right pleural effusion and right lower lobe atelectasis. Progressive mild left lower lobe atelectasis  10/13 US thoracentesis: Minimal fluid in the right pleural space. Thoracentesis not performed due to the small amount of pleural fluid. 2. Opacities in the right lower chest on recent chest radiograph probably represent a combination of elevation the right hemidiaphragm and atelectasis.     Hyperbilirubinemia GI following.  hepatitis panel nonreactive, likely due to ischemic hepatitis.  Normal doppler liver.  MRI abdomen obtained. No biliary obstruction/distention seen. Jaundice slowly clearing. Bilirubin has decreased from 16.9 to 13.3--10.4 Monitor.   Hepatic  steatosis/hepatomegaly Needs to stop alcohol. Mother wants for her to go for inpatient alcohol rehab again. I have discussed this with TOC. MRI demonstrated cirrhosis of the liver without portal hypertension or biliary duct obstruction.    Alcohol use disorder, moderate, dependence/Poly substance abuse([name]) Patient is hoping to quit drinking. She will need inpatient rehab, but this will likely have to wait until after she discharged from SNF.   Acute hyponatremia Resolved. Monitor.   Hyperkalemia, Resolved   Hypomagnesemia, mag repleted.  Macrocytic anemia secondary to liver cirrhosis, anemia work-up within normal range.   Alcohol withdrawal ([name]) CIWA protocol   Euthyroid sick syndrome Elevated TSH, normal free T4, repeat thyroid functions after 4 to 6 weeks, follow-up with PCP  I have seen and examined this patient myself. I have spent 34 minutes in her evaluation and care.  DVT prophylaxis: Lovenox Code Status: Full Code Family Communication: None available Disposition Plan: SNF   [name] Lucianne Muss [facility] Direct contact: see [email or web address]  7PM-7AM contact night coverage as above

## 2020-12-29 NOTE — Progress Notes (Signed)
Occupational Therapy Treatment Patient Details Name: ZOEIE RITTER MRN: [id number] DOB: 1976-03-13 Today's Date: 12/29/2020   History of present illness 45 y.o. female with medical history significant for Bipolar disorder, diabetes, alcohol use disorder and polysubstance abuse, FSH muscular dystrophy, with history of frequent falls, last seen by neurology in January 2020, who was brought in by EMS after she fell at home 2 days prior and was unable to get up.   OT comments  Ms Hornbaker was seen for OT/PT co-treatment on this date. Upon arrival to room pt reclined in bed, encouragement to participate. Pt requires MAX A x2 for bed mobility and lateral scoot t/f. CGA + single UE support hair brushing sitting EOB. MAX A for LBD at bed level. Pt self-limiting progress toward goals. Pt continues to benefit from skilled OT services to maximize return to PLOF and minimize risk of future falls, injury, caregiver burden, and readmission. Will continue to follow POC. Discharge recommendation remains appropriate.     Recommendations for follow up therapy are one component of a multi-disciplinary discharge planning process, led by the attending physician.  Recommendations may be updated based on patient status, additional functional criteria and insurance authorization.    Follow Up Recommendations  SNF    Equipment Recommendations  3 in 1 bedside commode    Recommendations for Other Services      Precautions / Restrictions Precautions Precautions: Fall Precaution Comments: watch HR Restrictions Weight Bearing Restrictions: No       Mobility Bed Mobility Overal bed mobility: Needs Assistance Bed Mobility: Supine to Sit;Sit to Supine     Supine to sit: Max assist;+2 for physical assistance Sit to supine: Max assist;+2 for physical assistance   General bed mobility comments: pt minimally attempts to assist with eccentric control    Transfers Overall transfer level: Needs assistance    Transfers: Lateral/Scoot Transfers          Lateral/Scoot Transfers: Max assist;+2 physical assistance General transfer comment: pt refused sit<>stand    Balance Overall balance assessment: Needs assistance Sitting-balance support: Single extremity supported;Feet supported Sitting balance-Leahy Scale: Fair                                     ADL either performed or assessed with clinical judgement   ADL Overall ADL's : Needs assistance/impaired                                       General ADL Comments: CGA + single UE support hair brushing sitting EOB. MAX A for LBD at bed level.      Cognition Arousal/Alertness: Awake/alert Behavior During Therapy: WFL for tasks assessed/performed Overall Cognitive Status: Impaired/Different from baseline Area of Impairment: Following commands;Safety/judgement;Problem solving                       Following Commands: Follows one step commands inconsistently;Follows one step commands with increased time Safety/Judgement: Decreased awareness of safety;Decreased awareness of deficits   Problem Solving: Slow processing;Decreased initiation;Difficulty sequencing;Requires verbal cues;Requires tactile cues          Exercises Exercises: Other exercises Other Exercises Other Exercises: Pt educated re: d/c recs, falls prevention, HEP Other Exercises: LBD, UE therex demo, grooming, sup<>sit, sitting/standing balance/tolerance   Shoulder Instructions       General Comments pitting edema  noted to BLE    Pertinent Vitals/ Pain       Pain Assessment: Faces Faces Pain Scale: Hurts even more Pain Location: external hemorroids Pain Descriptors / Indicators: Discomfort;Grimacing;Guarding;Moaning Pain Intervention(s): Limited activity within patient's tolerance;Repositioned   Frequency  Min 2X/week        Progress Toward Goals  OT Goals(current goals can now be found in the care plan  section)  Progress towards OT goals: Progressing toward goals  Acute Rehab OT Goals Patient Stated Goal: to get stronger OT Goal Formulation: Patient unable to participate in goal setting Time For Goal Achievement: 01/02/21 Potential to Achieve Goals: Fair ADL Goals Pt Will Perform Grooming: standing;with min assist Pt Will Perform Lower Body Dressing: with min assist;sit to/from stand Pt Will Transfer to Toilet: with min assist;ambulating Pt Will Perform Toileting - Clothing Manipulation and hygiene: with min assist;sit to/from stand  Plan Discharge plan remains appropriate;Frequency remains appropriate    Co-evaluation    PT/OT/SLP Co-Evaluation/Treatment: Yes Reason for Co-Treatment: Necessary to address cognition/behavior during functional activity;For patient/therapist safety;To address functional/ADL transfers PT goals addressed during session: Mobility/safety with mobility;Balance OT goals addressed during session: ADL's and self-care      AM-PAC OT "6 Clicks" Daily Activity     Outcome Measure   Help from another person eating meals?: A Little Help from another person taking care of personal grooming?: A Little Help from another person toileting, which includes using toliet, bedpan, or urinal?: A Lot Help from another person bathing (including washing, rinsing, drying)?: A Lot Help from another person to put on and taking off regular upper body clothing?: A Little Help from another person to put on and taking off regular lower body clothing?: A Lot 6 Click Score: 15    End of Session    OT Visit Diagnosis: Unsteadiness on feet (R26.81);Muscle weakness (generalized) (M62.81);History of falling (Z91.81)   Activity Tolerance Patient limited by fatigue   Patient Left in bed;with call bell/phone within reach;with bed alarm set   Nurse Communication          Time: 3716-9678 OT Time Calculation (min): 28 min  Charges: OT General Charges $OT Visit: 1 Visit OT  Treatments $Self Care/Home Management : 8-22 mins  Kathie Dike, M.S. OTR/L  12/29/20, 3:56 PM  ascom [phone]

## 2020-12-29 NOTE — Progress Notes (Signed)
Physical Therapy Treatment Patient Details Name: [name] MRN: [id number] DOB: 06-26-1975 Today's Date: 12/29/2020   History of Present Illness 45 y.o. female with medical history significant for Bipolar disorder, diabetes, alcohol use disorder and polysubstance abuse, FSH muscular dystrophy, with history of frequent falls, last seen by neurology in January 2020, who was brought in by EMS after she fell at home 2 days prior and was unable to get up.    PT Comments    Pt received supine in bed, agreeable to therapy with motivation by PT/OT. Pt reports she was up in the chair earlier; pt refused STS and refused transfer to return to chair. Supine<>sit performed with MAX Ax2 with minimal effort of pt. Pt educated on lack of safety with returning to supine due to no eccentric control and lack of communication with PT - PT caught pt trunk to avoid hitting head on bed rail. She remained sitting EOB for about 10 minutes; weight shifting in all directions noted as pt attempted to remove pressure from hemorrhoids. With max encouragement, pt participated in 2 therapeutic exercises. PT did advise pt to become more aware of LE positioning as her R hip tends to enter ER as a comfort position and pt reports it is difficult to "turn toes inward" due to weak IR. Pt is able to IR hip with RLE extended - this was provided as an HEP. Would benefit from skilled PT to address above deficits and promote optimal return to PLOF.     Recommendations for follow up therapy are one component of a multi-disciplinary discharge planning process, led by the attending physician.  Recommendations may be updated based on patient status, additional functional criteria and insurance authorization.  Follow Up Recommendations  SNF     Equipment Recommendations  Other (comment)    Recommendations for Other Services       Precautions / Restrictions Precautions Precautions: Fall Precaution Comments: watch  HR Restrictions Weight Bearing Restrictions: No     Mobility  Bed Mobility Overal bed mobility: Needs Assistance Bed Mobility: Supine to Sit;Sit to Supine     Supine to sit: Max assist;+2 for physical assistance Sit to supine: Max assist;+2 for physical assistance   General bed mobility comments: MAX Ax2 required to manage BLE and trunk with minimal effort by patient. pt minimally attempts to assist with eccentric control    Transfers Overall transfer level: Needs assistance   Transfers: Lateral/Scoot Transfers          Lateral/Scoot Transfers: Max assist;+2 physical assistance General transfer comment: pt refused sit<>stand. MAX A x2 to lift and provide lateral shift. VC to lean forward to assist with lifting hips  Ambulation/Gait             General Gait Details: unsafe/unable   Stairs             Wheelchair Mobility    Modified Rankin (Stroke Patients Only)       Balance Overall balance assessment: Needs assistance Sitting-balance support: Single extremity supported;Feet supported Sitting balance-Leahy Scale: Fair Sitting balance - Comments: Close SUP-CGA - pt leaning in all directions to ease discomfort of hemorrhoids       Standing balance comment: deferred - pt refused to stand                            Cognition Arousal/Alertness: Awake/alert Behavior During Therapy: WFL for tasks assessed/performed Overall Cognitive Status: Impaired/Different from baseline Area of Impairment:  Following commands;Safety/judgement;Problem solving                       Following Commands: Follows one step commands inconsistently;Follows one step commands with increased time Safety/Judgement: Decreased awareness of safety;Decreased awareness of deficits   Problem Solving: Slow processing;Decreased initiation;Difficulty sequencing;Requires verbal cues;Requires tactile cues        Exercises General Exercises - Lower Extremity Ankle  Circles/Pumps: AROM;10 reps;Supine;Both Long Arc Quad: Strengthening;Both;10 reps;Seated Other Exercises Other Exercises: Pt educated re: d/c recs, falls prevention, HEP Other Exercises: LBD, UE therex demo, grooming, sup<>sit, sitting/standing balance/tolerance Other Exercises: LAQ in sitting, max verbal encouragement to perform. Ankle pumps in supine with instruction to IR hip - pt educated on weakness of IR and encouraged to perform IR/ER of the hips, esp the right. DF stretch BLE x60s each while pt performed grooming with OT.    General Comments General comments (skin integrity, edema, etc.): pitting edema noted to BLE      Pertinent Vitals/Pain Pain Assessment: Faces Faces Pain Scale: Hurts even more Pain Location: external hemorroids Pain Descriptors / Indicators: Discomfort;Grimacing;Guarding;Moaning Pain Intervention(s): Limited activity within patient's tolerance;Repositioned    Home Living                      Prior Function            PT Goals (current goals can now be found in the care plan section) Acute Rehab PT Goals Patient Stated Goal: to get stronger    Frequency    Min 2X/week      PT Plan      Co-evaluation PT/OT/SLP Co-Evaluation/Treatment: Yes Reason for Co-Treatment: Necessary to address cognition/behavior during functional activity;For patient/therapist safety;To address functional/ADL transfers PT goals addressed during session: Mobility/safety with mobility;Strengthening/ROM OT goals addressed during session: ADL's and self-care;Strengthening/ROM      AM-PAC PT "6 Clicks" Mobility   Outcome Measure  Help needed turning from your back to your side while in a flat bed without using bedrails?: A Lot Help needed moving from lying on your back to sitting on the side of a flat bed without using bedrails?: A Lot Help needed moving to and from a bed to a chair (including a wheelchair)?: A Lot Help needed standing up from a chair using  your arms (e.g., wheelchair or bedside chair)?: Total Help needed to walk in hospital room?: Total Help needed climbing 3-5 steps with a railing? : Total 6 Click Score: 9    End of Session Equipment Utilized During Treatment: Oxygen;Gait belt Activity Tolerance: Patient tolerated treatment well;Patient limited by fatigue;Patient limited by pain Patient left: with call bell/phone within reach;in bed;with bed alarm set Nurse Communication: Mobility status PT Visit Diagnosis: Other abnormalities of gait and mobility (R26.89);Muscle weakness (generalized) (M62.81);History of falling (Z91.81)     Time: 0960-4540 PT Time Calculation (min) (ACUTE ONLY): 28 min  Charges:  $Therapeutic Exercise: 8-22 mins                     Basilia Jumbo PT, DPT 12/29/20 4:58 PM [phone]

## 2020-12-30 ENCOUNTER — Inpatient Hospital Stay: Payer: Self-pay

## 2020-12-30 ENCOUNTER — Inpatient Hospital Stay: Payer: Medicare Other

## 2020-12-30 ENCOUNTER — Inpatient Hospital Stay: Payer: Medicare Other | Admitting: Radiology

## 2020-12-30 DIAGNOSIS — R6520 Severe sepsis without septic shock: Secondary | ICD-10-CM | POA: Diagnosis not present

## 2020-12-30 DIAGNOSIS — A419 Sepsis, unspecified organism: Secondary | ICD-10-CM | POA: Diagnosis not present

## 2020-12-30 DIAGNOSIS — K7200 Acute and subacute hepatic failure without coma: Secondary | ICD-10-CM | POA: Diagnosis not present

## 2020-12-30 HISTORY — PX: IR FLUORO GUIDE CV LINE RIGHT: IMG2283

## 2020-12-30 HISTORY — PX: IR US GUIDE VASC ACCESS RIGHT: IMG2390

## 2020-12-30 LAB — BODY FLUID CELL COUNT WITH DIFFERENTIAL
Eos, Fluid: 0 %
Lymphs, Fluid: 50 %
Monocyte-Macrophage-Serous Fluid: 42 %
Neutrophil Count, Fluid: 8 %
Total Nucleated Cell Count, Fluid: 32 cu mm

## 2020-12-30 LAB — CBC
HCT: 26.8 % — ABNORMAL LOW (ref 36.0–46.0)
Hemoglobin: 8.6 g/dL — ABNORMAL LOW (ref 12.0–15.0)
MCH: 36.4 pg — ABNORMAL HIGH (ref 26.0–34.0)
MCHC: 32.1 g/dL (ref 30.0–36.0)
MCV: 113.6 fL — ABNORMAL HIGH (ref 80.0–100.0)
Platelets: UNDETERMINED 10*3/uL (ref 150–400)
RBC: 2.36 MIL/uL — ABNORMAL LOW (ref 3.87–5.11)
RDW: 22.6 % — ABNORMAL HIGH (ref 11.5–15.5)
WBC: 18.9 10*3/uL — ABNORMAL HIGH (ref 4.0–10.5)
nRBC: 0.4 % — ABNORMAL HIGH (ref 0.0–0.2)

## 2020-12-30 LAB — HEPATIC FUNCTION PANEL
ALT: 37 U/L (ref 0–44)
AST: 195 U/L — ABNORMAL HIGH (ref 15–41)
Albumin: <1.5 g/dL — ABNORMAL LOW (ref 3.5–5.0)
Alkaline Phosphatase: 177 U/L — ABNORMAL HIGH (ref 38–126)
Bilirubin, Direct: 6.3 mg/dL — ABNORMAL HIGH (ref 0.0–0.2)
Indirect Bilirubin: 3.7 mg/dL — ABNORMAL HIGH (ref 0.3–0.9)
Total Bilirubin: 10 mg/dL — ABNORMAL HIGH (ref 0.3–1.2)
Total Protein: 6.4 g/dL — ABNORMAL LOW (ref 6.5–8.1)

## 2020-12-30 LAB — BASIC METABOLIC PANEL
Anion gap: 12 (ref 5–15)
BUN: 23 mg/dL — ABNORMAL HIGH (ref 6–20)
CO2: 27 mmol/L (ref 22–32)
Calcium: 8.5 mg/dL — ABNORMAL LOW (ref 8.9–10.3)
Chloride: 101 mmol/L (ref 98–111)
Creatinine, Ser: 0.34 mg/dL — ABNORMAL LOW (ref 0.44–1.00)
GFR, Estimated: >60 mL/min (ref >60)
Glucose, Bld: 200 mg/dL — ABNORMAL HIGH (ref 70–99)
Potassium: 4.5 mmol/L (ref 3.5–5.1)
Sodium: 140 mmol/L (ref 135–145)

## 2020-12-30 LAB — ALBUMIN, PLEURAL OR PERITONEAL FLUID: Albumin, Fluid: <1.5 g/dL

## 2020-12-30 LAB — GLUCOSE, CAPILLARY
Glucose-Capillary: 211 mg/dL — ABNORMAL HIGH (ref 70–99)
Glucose-Capillary: 165 mg/dL — ABNORMAL HIGH (ref 70–99)
Glucose-Capillary: 160 mg/dL — ABNORMAL HIGH (ref 70–99)
Glucose-Capillary: 145 mg/dL — ABNORMAL HIGH (ref 70–99)

## 2020-12-30 LAB — AMMONIA: Ammonia: 40 umol/L — ABNORMAL HIGH (ref 9–35)

## 2020-12-30 LAB — PATHOLOGIST SMEAR REVIEW

## 2020-12-30 LAB — MAGNESIUM: Magnesium: 1.4 mg/dL — ABNORMAL LOW (ref 1.7–2.4)

## 2020-12-30 LAB — PHOSPHORUS: Phosphorus: 2.2 mg/dL — ABNORMAL LOW (ref 2.5–4.6)

## 2020-12-30 MED ORDER — MAGNESIUM SULFATE 4 GM/100ML IV SOLN
4.0000 g | Freq: Once | INTRAVENOUS | Status: AC
  Administered 2020-12-30: 4 g via INTRAVENOUS
  Filled 2020-12-30: qty 100

## 2020-12-30 MED ORDER — HEPARIN SOD (PORK) LOCK FLUSH 100 UNIT/ML IV SOLN
INTRAVENOUS | Status: AC
  Administered 2020-12-30: 500 [IU]
  Filled 2020-12-30: qty 5

## 2020-12-30 MED ORDER — LIDOCAINE-EPINEPHRINE 1 %-1:100000 IJ SOLN
INTRAMUSCULAR | Status: AC
  Administered 2020-12-30: 10 mL
  Filled 2020-12-30: qty 1

## 2020-12-30 MED ORDER — POTASSIUM PHOSPHATES 15 MMOLE/5ML IV SOLN
30.0000 mmol | Freq: Once | INTRAVENOUS | Status: AC
  Administered 2020-12-30: 30 mmol via INTRAVENOUS
  Filled 2020-12-30: qty 10

## 2020-12-30 NOTE — Progress Notes (Signed)
Physical Therapy Treatment Patient Details Name: [name] MRN: [id number] DOB: [birth year] Today's Date: 12/30/2020   History of Present Illness 45 y.o. female with medical history significant for Bipolar disorder, diabetes, alcohol use disorder and polysubstance abuse, FSH muscular dystrophy, with history of frequent falls, last seen by neurology in January 2020, who was brought in by EMS after she fell at home 2 days prior and was unable to get up.    PT Comments    Pt received supine in bed, required motivation to participate with therapy. PT aide with PT to assist with plan of squat pivot transfer to chair. PT discover pt incontinent of bowels - pt aware however did not mention at beginning of session. Pt educated on skin integrity and importance of performing pericare as soon as possible. PT called CNA in for assistance. PT assisted with rolling and encouraging independence with bed mobility via VC through body mechanics and sequencing. Once complete, pt sat up to EOB with MAX A. Prior to transfer, pt became incontinent of bowels once again so pt was returned to supine and remained in bed at end of session. PT did attempt to guide pt through therex with minimal participation. Would benefit from skilled PT to address above deficits and promote optimal return to PLOF.   Recommendations for follow up therapy are one component of a multi-disciplinary discharge planning process, led by the attending physician.  Recommendations may be updated based on patient status, additional functional criteria and insurance authorization.  Follow Up Recommendations  SNF     Equipment Recommendations  Other (comment) (TBD at next venue of care)    Recommendations for Other Services       Precautions / Restrictions Precautions Precautions: Fall Restrictions Weight Bearing Restrictions: No     Mobility  Bed Mobility Overal bed mobility: Needs Assistance Bed Mobility: Rolling;Supine to Sit;Sit  to Supine Rolling: Min assist   Supine to sit: Max assist;HOB elevated Sit to supine: Max assist;+2 for physical assistance   General bed mobility comments: MAX A to manage BLE and trunk. +2 required to return to supine due to lack of eccentric control. Multiple rolls performed for pericare    Transfers                 General transfer comment: deferred - bowel incontinence while lying down and sitting EOB - returned to supine to perform pericare a second time  Ambulation/Gait             General Gait Details: unsafe/unable   Stairs             Wheelchair Mobility    Modified Rankin (Stroke Patients Only)       Balance Overall balance assessment: Needs assistance Sitting-balance support: Feet supported Sitting balance-Leahy Scale: Fair Sitting balance - Comments: Close SUP-CGA - pt leaning in all directions to ease discomfort of hemorrhoids                                    Cognition Arousal/Alertness: Awake/alert Behavior During Therapy: WFL for tasks assessed/performed Overall Cognitive Status: Within Functional Limits for tasks assessed                                        Exercises General Exercises - Lower Extremity Ankle Circles/Pumps: AROM;10 reps;Supine;Both Quad Sets:  AROM;Strengthening;Both;10 reps;Supine Other Exercises Other Exercises: Hip IR x 10 reps, focus on RLE - pt and cousin educated on weakness of IR and encouraged to perform IR/ER of the hips, esp the right. Non-compliant with HEP of R hip IR since last visit.    General Comments        Pertinent Vitals/Pain Pain Assessment: Faces Faces Pain Scale: Hurts even more Pain Location: external hemorroids Pain Descriptors / Indicators: Discomfort;Grimacing;Guarding;Moaning Pain Intervention(s): Limited activity within patient's tolerance;Monitored during session;Repositioned    Home Living                      Prior Function             PT Goals (current goals can now be found in the care plan section) Acute Rehab PT Goals Patient Stated Goal: to get stronger    Frequency    Min 2X/week      PT Plan      Co-evaluation              AM-PAC PT "6 Clicks" Mobility   Outcome Measure  Help needed turning from your back to your side while in a flat bed without using bedrails?: A Lot Help needed moving from lying on your back to sitting on the side of a flat bed without using bedrails?: A Lot Help needed moving to and from a bed to a chair (including a wheelchair)?: A Lot Help needed standing up from a chair using your arms (e.g., wheelchair or bedside chair)?: Total Help needed to walk in hospital room?: Total Help needed climbing 3-5 steps with a railing? : Total 6 Click Score: 9    End of Session Equipment Utilized During Treatment: Oxygen Activity Tolerance: Patient limited by fatigue;Patient limited by pain Patient left: with call bell/phone within reach;in bed;with bed alarm set;with family/visitor present Nurse Communication: Mobility status PT Visit Diagnosis: Other abnormalities of gait and mobility (R26.89);Muscle weakness (generalized) (M62.81);History of falling (Z91.81)     Time: 4765-4650 PT Time Calculation (min) (ACUTE ONLY): 33 min  Charges:  $Therapeutic Activity: 23-37 mins                     Basilia Jumbo PT, DPT 12/30/20 4:17 PM [phone]

## 2020-12-30 NOTE — Progress Notes (Signed)
PROGRESS NOTE  [name] ZOX:[id number] DOB: February 20, 1976 DOA: 12/17/2020 PCP: Eunice Blase, PA-C  Brief History   [name] is a 45 y.o. female with medical history significant for Bipolar disorder, diabetes, alcohol use disorder and polysubstance abuse, FSH muscular dystrophy, with history of falls, last seen by neurology in January 2020, who was brought in by EMS after she fell at home 2 days prior and was unable to get up.   While in ED, patient was found to have severe hyponatremia of 113, potassium 2.9.   She also has an ammonia of 52, bilirubin of 11.9. She was given potassium, she was also placed on normal saline. Her chest x-ray also showed a right lower lobe infiltrates, probable aspiration pneumonia.  She is started on antibiotics with Unasyn.   The patient remains jaundiced. She appears to be in hepatic encephalopathy, although her parents at bedside stated that she was awake earlier in the day.  The patient underwent MRI abdomen/liver. No ductal dilatation or obstruction seen. Marked hepatomegaly and steatosis. No evidence of portal hypertension. No acute biliary process. It also demonstrated basilar effusions and bibasilar airspace disease right greater than left.  During my visit with the patient this morning, she was much more alert and communicative. She was agreeable to increasing her activity and getting up to chair. However, I have just been contacted by the patient's nurse and told that she is now lethargic again. I have reduced her lyrica from 150 mg to 75 mg.   Edema of the patient's lower extremities appears to be worsening.   10/12 Patient was sitting on the chair, family was at bedside, patient still has mild shortness of breath and ascites and lower extremity edema.   Consultants  Gastroenterology Nephrology  Procedures  None  Antibiotics   Anti-infectives (From admission, onward)    Start     Dose/Rate Route Frequency Ordered Stop   12/22/20 2200   linezolid (ZYVOX) tablet 600 mg  Status:  Discontinued        600 mg Oral Every 12 hours 12/22/20 1143 12/27/20 1213   12/20/20 2200  vancomycin (VANCOCIN) IVPB 1000 mg/200 mL premix  Status:  Discontinued        1,000 mg 200 mL/hr over 60 Minutes Intravenous Every 12 hours 12/20/20 0839 12/22/20 1143   12/20/20 0930  vancomycin (VANCOCIN) 2,000 mg in sodium chloride 0.9 % 500 mL IVPB  Status:  Discontinued        2,000 mg 250 mL/hr over 120 Minutes Intravenous  Once 12/20/20 0839 12/20/20 0907   12/20/20 0930  vancomycin (VANCOCIN) 2,000 mg in sodium chloride 0.9 % 500 mL IVPB        2,000 mg 250 mL/hr over 120 Minutes Intravenous  Once 12/20/20 0907 12/20/20 1223   12/19/20 1630  rifaximin (XIFAXAN) tablet 550 mg        550 mg Oral 2 times daily 12/19/20 1533     12/18/20 1200  Ampicillin-Sulbactam (UNASYN) 3 g in sodium chloride 0.9 % 100 mL IVPB  Status:  Discontinued        3 g 200 mL/hr over 30 Minutes Intravenous Every 6 hours 12/18/20 1038 12/20/20 0839   12/18/20 0045  Ampicillin-Sulbactam (UNASYN) 3 g in sodium chloride 0.9 % 100 mL IVPB        3 g 200 mL/hr over 30 Minutes Intravenous  Once 12/18/20 0033 12/18/20 0303      Subjective  The patient is awake and alert. No acute  distress.  Objective   Vitals:  Vitals:   12/30/20 1134 12/30/20 1548  BP: 96/84 114/65  Pulse: (!) 109 (!) 110  Resp: 16 18  Temp: 99.5 F (37.5 C) 98.7 F (37.1 C)  SpO2: 97% 95%    Exam:  Constitutional:  Patient was dozing off, sleepy and disoriented x1 today Eyes:  pupils and irises appear normal Sclera remain icteric, but clearing. Respiratory:  CTA bilaterally, no w/r/r.  Respiratory effort normal. No retractions or accessory muscle use Cardiovascular:  RRR, no m/r/g 3-4+ pitting edema of lower extremities bilateral Normal pedal pulses Abdomen:  Obese and distended, no tenderness or masses No hernias No HSM Musculoskeletal:  Digits/nails BUE: no clubbing, cyanosis,  petechiae, infection exam of joints, bones, muscles of at least one of following: head/neck, RUE, LUE, RLE, LLE   Skin:  No rashes, lesions, ulcers palpation of skin: no induration or nodules Less jaundiced than yesterday Neurologic:  Sleepy today, dozing off and AAO [employer] all extremities. CN II-XII grossly intact. Psychiatric:  Pt seems depressed. Flat affect. Poor insight and judgement.   I have personally reviewed the following:   Today's Data  Vitals  Lab Data  CMP, CBC  Micro Data  Blood culture x 2 No growth MRSA by PCR is positive  Imaging  CT head US abdomen MRI liver/abdomen  Cardiology Data  EKG  Scheduled Meds:  budesonide  9 mg Oral Daily   enoxaparin (LOVENOX) injection  0.5 mg/kg Subcutaneous Q24H   feeding supplement  237 mL Oral BID BM   FLUoxetine  80 mg Oral Daily   fluticasone  1 spray Each Nare Daily   folic acid  1 mg Oral Daily   furosemide  40 mg Intravenous BID   insulin aspart  0-20 Units Subcutaneous TID WC   insulin aspart  0-5 Units Subcutaneous QHS   insulin aspart  3 Units Subcutaneous TID WC   insulin glargine-yfgn  14 Units Subcutaneous QHS   lactulose  20 g Oral TID   LORazepam  1 mg Intravenous Once   multivitamin with minerals  1 tablet Oral Daily   OLANZapine  5 mg Oral Daily   pantoprazole (PROTONIX) IV  40 mg Intravenous Q12H   pregabalin  75 mg Oral TID   rifaximin  550 mg Oral BID   thiamine  100 mg Oral Daily   Vitamin D (Ergocalciferol)  50,000 Units Oral Q7 days   Continuous Infusions:  sodium chloride 1,000 mL (12/29/20 1835)    Principal Problem:   Sepsis ([name]) Active Problems:   Alcohol withdrawal ([name])   Bipolar 2 disorder ([name])   Hypokalemia   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[name])   Acute hyponatremia   Alcohol use disorder, moderate, dependence ([name])   Hepatic steatosis   Polysubstance abuse ([name])   Hyperglycemia due to type 2 diabetes mellitus ([name])   Hyperbilirubinemia    Lactic acidosis   Alcoholic liver disease ([name])   Acute hepatic encephalopathy   Pressure injury of skin   LOS: 12 days   A & P  Sepsis ([name]) Resolved. Present on admission. Due to aspiration pneumonia, on IV vanco. MRSA screen +. The patient has converted to linezolid in preparation for dc.   Acute hepatic encephalopathy Patient's mental status seems to be improving reduced lyrica from 150 mg to 75 mg.   Continue lactulose 20 gm tid. Continue Xifaxan. OOB and PT/OT. Ammonia level 41    Ascites and peripheral Edema due to liver cirrhosis MRI abdomen  did not show portal hypertension  Continue Lasix 40 mg IV twice daily to diurese Patient agreed for paracentesis, ultrasound-guided paracentesis ordered, follow fluid studies to rule out SBP   Severe pulmonary hypertension on TTE TTE shows LVEF 60-65%, RVSP 60 to, severe pulmonary hypertension  CTA chest to rule out PE negative for PE, elevated right diaphragm, mild pleural effusion    Acute hypoxic respiratory failure  Continue supplemental O2 inhalation and gradually wean off Continue inhalers as needed Follow chest x-ray Leukocytosis, unknown cause, procalcitonin negative, monitor off antibiotics for now Trend WBC count 10/12 CXR Hypoventilation, Progressive moderately large right pleural effusion and right lower lobe atelectasis. Progressive mild left lower lobe atelectasis  10/13 US thoracentesis: Minimal fluid in the right pleural space. Thoracentesis not performed due to the small amount of pleural fluid. 2. Opacities in the right lower chest on recent chest radiograph probably represent a combination of elevation the right hemidiaphragm and atelectasis.     Hyperbilirubinemia GI following.  hepatitis panel nonreactive, likely due to ischemic hepatitis.  Normal doppler liver.  MRI abdomen obtained. No biliary obstruction/distention seen. Jaundice slowly clearing. Bilirubin has decreased from 16.9 to 13.3--10.4  Monitor.   Hepatic steatosis/hepatomegaly Needs to stop alcohol. Mother wants for her to go for inpatient alcohol rehab again. I have discussed this with TOC. MRI demonstrated cirrhosis of the liver without portal hypertension or biliary duct obstruction.    Alcohol use disorder, moderate, dependence/Poly substance abuse([name]) Patient is hoping to quit drinking. She will need inpatient rehab, but this will likely have to wait until after she discharged from SNF.   Acute hyponatremia Resolved. Monitor.   Hyperkalemia, Resolved   Hypomagnesemia, mag repleted. Hypophosphatemia secondary to poor nutrition, phos repleted.  Monitor and replete as needed  Macrocytic anemia secondary to liver cirrhosis, anemia work-up within normal range.   Alcohol withdrawal ([name]) CIWA protocol   Euthyroid sick syndrome Elevated TSH, normal free T4, repeat thyroid functions after 4 to 6 weeks, follow-up with PCP   10/14 Poor peripheral IV access, PICC team was consulted which was unsuccessful attempted twice.  So IR consulted for PICC line insertion   I have seen and examined this patient myself. I have spent 40 minutes in her evaluation and care.  DVT prophylaxis: Lovenox Code Status: Full Code Family Communication: None available Disposition Plan: SNF   Tarika Mckethan Lucianne Muss [facility] Direct contact: see [email or web address]  7PM-7AM contact night coverage as above

## 2020-12-30 NOTE — Procedures (Signed)
Vascular and Interventional Radiology Procedure Note  Patient: [name] DOB: 1975-11-02 Medical Record Number: [id number] Note Date/Time: 12/30/20 9:53 AM   Performing Physician: Roanna Banning, MD Assistant(s): None  Diagnosis:  Ascites  Procedure: DIAGNOSTIC PARACENTESIS  Anesthesia: Local Anesthetic Complications: None Estimated Blood Loss:  0 mL Specimens:  Sent  Body Fluid and Albumin (ordered by Primary Service)  Findings:  The Right Upper quadrant peritoneal space was accessed with a 43F Pigtail catheter, and bilious ascitic fluid was obtained.  See detailed procedure note with images in PACS. The patient tolerated the procedure well without incident or complication and was returned to Floor Bed in stable condition.    Roanna Banning, MD [facility] Inter[facility] Radiology   Pager. [phone] Clinic. [phone]

## 2020-12-30 NOTE — Progress Notes (Signed)
Second PICC nurse at bedside to attempt PICC placement.  Observed previous PICC nurses assessment and noted that the only viable vein for attempt to be Left brachial.  Attempted x2 with vein cannulation and inability to thread guidewire.  Attempts stopped and pressure dressing applied.  Patient tolerated attempts without issues.  Follow provided by Curt Jews, RN.

## 2020-12-30 NOTE — Progress Notes (Signed)
Thoracentesis performed at 9 am. 2 L of fluid removed.  Interventional Radiology to attempt PICC line placement later this afternoon.

## 2020-12-30 NOTE — Progress Notes (Addendum)
This RN at bedside to attempt PICC placement per order.   Right arm assessed first, with no appropriate vessel. Cephalic would be greater than 100% vessel occupancy, unable to visualize basilic, and brachial vein directly under artery and unsafe to attempt.   Left arm assessed, cephalic again would be greater than 100% vessel occupancy, basilic again not visualized on left. Brachial vein on left arm of 35% occupancy, only vessel appropriate for PICC placement. During first attempt, vein was cannulated, but unable to thread guide wire in past axilla due to meeting resistance. Attempted second time, unable to cannulate vein on this stick.   Second PICC RN requested to bedside to assess, also unsuccessful.   Beside RN and attending MD notified.

## 2020-12-30 NOTE — Procedures (Signed)
Vascular and Interventional Radiology Procedure Note  Patient: [name] DOB: [birth year] Medical Record Number: [id number] Note Date/Time: 12/30/20 6:00 PM   Performing Physician: Roanna Banning, MD Assistant(s): None  Diagnosis: Poor IV access  Procedure: TUNNELED CENTRAL VENOUS CATHETER PLACEMENT  Anesthesia: Local Anesthetic Complications: None Estimated Blood Loss: Minimal Specimens:  None  Findings:  Successful placement of right-sided,  92F, DL 25 cm  (tip-to-cuff), tunneled "PowerLine" CVC with the tip of the catheter in the proximal right atrium.  Plan: Catheter ready for use.  See detailed procedure note with images in PACS. The patient tolerated the procedure well without incident or complication and was returned to Floor Bed in stable condition.    Roanna Banning, MD Vascular and Interventional Radiology Specialists [facility] Radiology   Pager. [phone] Clinic. [phone]

## 2020-12-31 DIAGNOSIS — A419 Sepsis, unspecified organism: Secondary | ICD-10-CM | POA: Diagnosis not present

## 2020-12-31 DIAGNOSIS — E871 Hypo-osmolality and hyponatremia: Secondary | ICD-10-CM | POA: Diagnosis not present

## 2020-12-31 DIAGNOSIS — K7682 Hepatic encephalopathy: Secondary | ICD-10-CM | POA: Diagnosis not present

## 2020-12-31 DIAGNOSIS — F1020 Alcohol dependence, uncomplicated: Secondary | ICD-10-CM | POA: Diagnosis not present

## 2020-12-31 LAB — BASIC METABOLIC PANEL
Anion gap: 6 (ref 5–15)
BUN: 20 mg/dL (ref 6–20)
CO2: 36 mmol/L — ABNORMAL HIGH (ref 22–32)
Calcium: 8.3 mg/dL — ABNORMAL LOW (ref 8.9–10.3)
Chloride: 100 mmol/L (ref 98–111)
Creatinine, Ser: 0.52 mg/dL (ref 0.44–1.00)
GFR, Estimated: >60 mL/min (ref >60)
Glucose, Bld: 138 mg/dL — ABNORMAL HIGH (ref 70–99)
Potassium: 3 mmol/L — ABNORMAL LOW (ref 3.5–5.1)
Sodium: 142 mmol/L (ref 135–145)

## 2020-12-31 LAB — CBC
HCT: 26.7 % — ABNORMAL LOW (ref 36.0–46.0)
Hemoglobin: 8 g/dL — ABNORMAL LOW (ref 12.0–15.0)
MCH: 34.9 pg — ABNORMAL HIGH (ref 26.0–34.0)
MCHC: 30 g/dL (ref 30.0–36.0)
MCV: 116.6 fL — ABNORMAL HIGH (ref 80.0–100.0)
Platelets: 120 10*3/uL — ABNORMAL LOW (ref 150–400)
RBC: 2.29 MIL/uL — ABNORMAL LOW (ref 3.87–5.11)
RDW: 22.6 % — ABNORMAL HIGH (ref 11.5–15.5)
WBC: 18.2 10*3/uL — ABNORMAL HIGH (ref 4.0–10.5)
nRBC: 0.3 % — ABNORMAL HIGH (ref 0.0–0.2)

## 2020-12-31 LAB — GLUCOSE, CAPILLARY
Glucose-Capillary: 169 mg/dL — ABNORMAL HIGH (ref 70–99)
Glucose-Capillary: 294 mg/dL — ABNORMAL HIGH (ref 70–99)
Glucose-Capillary: 171 mg/dL — ABNORMAL HIGH (ref 70–99)
Glucose-Capillary: 253 mg/dL — ABNORMAL HIGH (ref 70–99)

## 2020-12-31 LAB — HEPATIC FUNCTION PANEL
ALT: 38 U/L (ref 0–44)
AST: 189 U/L — ABNORMAL HIGH (ref 15–41)
Albumin: <1.5 g/dL — ABNORMAL LOW (ref 3.5–5.0)
Alkaline Phosphatase: 165 U/L — ABNORMAL HIGH (ref 38–126)
Bilirubin, Direct: 6 mg/dL — ABNORMAL HIGH (ref 0.0–0.2)
Indirect Bilirubin: 3.2 mg/dL — ABNORMAL HIGH (ref 0.3–0.9)
Total Bilirubin: 9.2 mg/dL — ABNORMAL HIGH (ref 0.3–1.2)
Total Protein: 6.4 g/dL — ABNORMAL LOW (ref 6.5–8.1)

## 2020-12-31 LAB — PHOSPHORUS: Phosphorus: 3.6 mg/dL (ref 2.5–4.6)

## 2020-12-31 LAB — AMMONIA: Ammonia: 66 umol/L — ABNORMAL HIGH (ref 9–35)

## 2020-12-31 LAB — MAGNESIUM: Magnesium: 1.9 mg/dL (ref 1.7–2.4)

## 2020-12-31 MED ORDER — POTASSIUM CHLORIDE CRYS ER 20 MEQ PO TBCR
40.0000 meq | EXTENDED_RELEASE_TABLET | Freq: Once | ORAL | Status: AC
  Administered 2020-12-31: 40 meq via ORAL
  Filled 2020-12-31: qty 2

## 2020-12-31 NOTE — Progress Notes (Signed)
PROGRESS NOTE    [name]  GEX:[id number] DOB: 21-Nov-1975 DOA: 12/17/2020 PCP: Janine Limbo, PA-C   Brief Narrative:  [name] is a 45 y.o. female with medical history significant for Bipolar disorder, diabetes, alcohol use disorder and polysubstance abuse, FSH muscular dystrophy, with history of falls, last seen by neurology in January 2020, who was brought in by EMS after she fell at home 2 days prior and was unable to get up.   While in ED, patient was found to have severe hyponatremia of 113, potassium 2.9.   She also has an ammonia of 52, bilirubin of 11.9. She was given potassium, she was also placed on normal saline. Her chest x-ray also showed a right lower lobe infiltrates, probable aspiration pneumonia.  She is started on antibiotics with Unasyn.   The patient remains jaundiced. She appears to be in hepatic encephalopathy, although her parents at bedside stated that she was awake earlier in the day.   The patient underwent MRI abdomen/liver. No ductal dilatation or obstruction seen. Marked hepatomegaly and steatosis. No evidence of portal hypertension. No acute biliary process. It also demonstrated basilar effusions and bibasilar airspace disease right greater than left.   During my visit with the patient this morning, she was much more alert and communicative. She was agreeable to increasing her activity and getting up to chair. However, I have just been contacted by the patient's nurse and told that she is now lethargic again. I have reduced her lyrica from 150 mg to 75 mg.    Edema of the patient's lower extremities appears to be worsening.    10/12 Patient was sitting on the chair, family was at bedside, patient still has mild shortness of breath and ascites and lower extremity edema.   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  Present on admission.  Due to aspiration pneumonia, was on IV vanco. MRSA screen +. The patient was converted to linezolid in preparation for dc.   Acute hepatic encephalopathy Patient's mental status seems to be improving reduced lyrica from 150 mg to 75 mg.   Continue lactulose 20 gm tid. Continue Xifaxan. OOB and PT/OT. Ammonia level 41>66, although mental status is reported to be the best it has been to date       Ascites and peripheral Edema due to liver cirrhosis MRI abdomen did not show portal hypertension  Continue Lasix 40 mg IV twice daily to diurese Patient agreed for paracentesis, ultrasound-guided paracentesis ordered, follow fluid studies to rule out SBP     Severe pulmonary hypertension on TTE TTE shows LVEF 60-65%, RVSP 60 to, severe pulmonary hypertension  CTA chest to rule out PE negative for PE, elevated right diaphragm, mild pleural effusion      Acute hypoxic respiratory failure  Continue supplemental O2 inhalation and gradually weaning off Continue inhalers as needed Leukocytosis, unknown cause, procalcitonin negative, monitor off antibiotics for now Trend WBC count 10/12 CXR Hypoventilation, Progressive moderately large right pleural effusion and  right lower lobe atelectasis. Progressive mild left lower lobe atelectasis  10/13 US thoracentesis: Minimal fluid in the right pleural space. Thoracentesis not performed due to the small amount of pleural fluid. 2. Opacities in the right lower chest on recent chest radiograph probably represent a combination of elevation the right hemidiaphragm and atelectasis.       Hyperbilirubinemia GI following.  hepatitis panel nonreactive, likely due to ischemic hepatitis.  Normal doppler liver.  MRI abdomen obtained. No biliary obstruction/distention seen. Jaundice slowly clearing. Bilirubin continues to slowly decrease   Hepatic steatosis/hepatomegaly Needs to stop alcohol. Mother wants for her to go for inpatient alcohol rehab again. I have discussed this with TOC. MRI demonstrated cirrhosis of the liver without portal hypertension or biliary duct obstruction.    Alcohol use disorder, moderate, dependence/Poly substance abuse([name]) Patient is hoping to quit drinking. She will need inpatient rehab, but this will likely have to wait until after she discharged from SNF.   Acute hyponatremia Resolved. Monitor.   Hyperkalemia, Resolved    Hypomagnesemia, mag repleted.  Recheck with morning labs  Hypophosphatemia secondary to poor nutrition, phos repleted.  Monitor and replete as needed   Macrocytic anemia secondary to liver cirrhosis, anemia work-up within normal range.    Alcohol withdrawal ([place]) CIWA protocol   Euthyroid sick syndrome Elevated TSH, normal free T4, repeat thyroid functions after 4 to 6 weeks, follow-up with PCP     10/14 Poor peripheral IV access, PICC team was consulted which was unsuccessful attempted twice.  So IR consulted for PICC line insertion which has been completed    DVT prophylaxis: Lovenox SQ  Code Status: full    Code Status Orders  (From admission, onward)           Start     Ordered   12/18/20 0513  Full code  Continuous        12/18/20 0515           Code Status History     Date Active Date Inactive Code Status Order ID Comments User Context   09/10/2018 0212 09/15/2018 1758 Full Code [id number]  Lamont Dowdy, NP Inpatient   09/26/2012 2221 10/02/2012 2135 Full Code [id number]  Orvan Falconer, MD Inpatient   11/13/2011 2332 11/15/2011 1536 Full Code [id number]  Lenis Noon, RN Inpatient       Family Communication: None at bedside today Disposition Plan:    Patient not yet medically stable for discharge we will continue to titrate and monitor ammonia levels continue to monitor liver function, would benefit from nutrition consult, albumin of less than 1.5 Consults called:  gi Admission status: Inpatient   Consultants:  As above  Procedures:  CT HEAD WO CONTRAST (5MM)  Result Date: 12/17/2020 CLINICAL DATA:  Patient found down. EXAM: CT HEAD WITHOUT CONTRAST TECHNIQUE: Contiguous axial images were obtained from the base of the skull through the vertex without intravenous contrast. COMPARISON:  None. FINDINGS: Brain: No evidence of acute infarction, hemorrhage, hydrocephalus, extra-axial collection or mass lesion/mass effect. Vascular: No hyperdense vessel or unexpected calcification. Skull: Normal. Negative for fracture or focal lesion. Sinuses/Orbits: No acute finding. Other: None. IMPRESSION: No acute intracranial pathology. Electronically Signed   By: Virgina Norfolk M.D.   On: 12/17/2020 23:51   CT Angio Chest Pulmonary Embolism (PE) W or WO Contrast  Result Date: 12/29/2020 CLINICAL DATA:  Inpatient.  Dyspnea.  Abnormal chest radiograph. EXAM: CT ANGIOGRAPHY CHEST WITH CONTRAST TECHNIQUE: Multidetector CT imaging of the chest was performed using the  standard protocol during bolus administration of intravenous contrast. Multiplanar CT image reconstructions and MIPs were obtained to evaluate the vascular anatomy. CONTRAST:  27mL OMNIPAQUE IOHEXOL 350 MG/ML SOLN COMPARISON:  Chest radiograph from one day prior. FINDINGS: Cardiovascular: The study is high quality for the evaluation of pulmonary embolism. There are no filling defects in the central, lobar, segmental or subsegmental pulmonary artery branches to suggest acute pulmonary embolism. Normal course and caliber of the thoracic aorta. Top-normal caliber main pulmonary artery (3.1 cm diameter). Top-normal heart size. No  significant pericardial fluid/thickening. Mediastinum/Nodes: No discrete thyroid nodules. Unremarkable esophagus. No pathologically enlarged axillary, mediastinal or hilar lymph nodes. Lungs/Pleura: No pneumothorax. Small dependent right pleural effusion. No left pleural effusion. Moderate elevation of the right hemidiaphragm. Complete right lower lobe and near complete right middle lobe atelectasis. Moderate dependent left lower lobe and lingular atelectasis. No lung masses or significant pulmonary nodules in the aerated portions of the lungs. Upper abdomen: Small volume perihepatic ascites. Diffuse hepatic steatosis. Musculoskeletal: No aggressive appearing focal osseous lesions. Minimal thoracic spondylosis. Review of the MIP images confirms the above findings. IMPRESSION: 1. No pulmonary embolism. 2. Moderate elevation of the right hemidiaphragm. Complete right lower lobe and near complete right middle lobe atelectasis. Moderate dependent left lung base atelectasis. 3. Small dependent right pleural effusion. 4. Small volume perihepatic ascites. 5. Diffuse hepatic steatosis. Electronically Signed   By: Ilona Sorrel M.D.   On: 12/29/2020 18:28   [place] CHEST (PLEURAL EFFUSION)  Result Date: 12/29/2020 CLINICAL DATA:  Evaluate right pleural effusion for thoracentesis. EXAM: CHEST ULTRASOUND COMPARISON:  Chest radiograph 12/28/2020 FINDINGS: Minimal fluid in the right pleural space. IMPRESSION: 1. Minimal fluid in the right pleural space. Thoracentesis not performed due to the small amount of pleural fluid. 2. Opacities in the right lower chest on recent chest radiograph probably represent a combination of elevation the right hemidiaphragm and atelectasis. Electronically Signed   By: Markus Daft M.D.   On: 12/29/2020 12:25   MR LIVER W WO CONTRAST  Result Date: 12/23/2020 CLINICAL DATA:  A 45 year old female presents with jaundice and suspected hepatic encephalopathy. EXAM: MRI ABDOMEN WITHOUT AND WITH CONTRAST  TECHNIQUE: Multiplanar multisequence MR imaging of the abdomen was performed both before and after the administration of intravenous contrast. CONTRAST:  69mL GADAVIST GADOBUTROL 1 MMOL/ML IV SOLN COMPARISON:  Comparison made with abdominal sonogram which was performed on October second of 2022 and hepatic Doppler performed on December 20, 2020. FINDINGS: Lower chest: RIGHT effusion and basilar airspace disease, not well assessed in the setting of elevation of the RIGHT hemidiaphragm likely at least partially secondary to marked hepatomegaly. Small LEFT effusion. Lung base assessment is limited on MRI. Hepatobiliary: Marked hepatomegaly and marked hepatic steatosis. Dedicated MRCP images were not performed though there is no sign of intra or extrahepatic biliary duct distension. Gallbladder is collapsed. Portal and hepatic veins are patent. Pancreas: Normal intrinsic T1 signal. No ductal dilation or sign of inflammation. No focal lesion. Spleen:  Spleen normal in size and contour. Adrenals/Urinary Tract: Adrenal glands are normal. No suspicious renal lesion or hydronephrosis. No perinephric stranding. Stomach/Bowel: Gastrointestinal tract shows no acute process on limited assessment to the extent evaluated on abdominal MRI. Vascular/Lymphatic: No pathologically enlarged lymph nodes identified. No abdominal aortic aneurysm demonstrated. Other:  Small volume ascites. Musculoskeletal: No suspicious bone lesions identified. Marked muscular atrophy in this patient with history of muscular dystrophy. IMPRESSION: Marked hepatomegaly and marked hepatic steatosis. Lobular hepatic contours and marked hepatomegaly with liver span exceeding  28 cm. In the setting of ascites would correlate with any signs of steatohepatitis. No current evidence of portal hypertension. No acute biliary process. Note the dedicated MRCP images were not performed though there is no biliary duct dilation and the gallbladder is completely collapsed. No  biliary duct distension. Basilar effusions and bibasilar airspace disease RIGHT greater than LEFT. Cardiomegaly not well assessed. Electronically Signed   By: Zetta Bills M.D.   On: 12/23/2020 14:59   US Paracentesis  Result Date: 12/30/2020 INDICATION: Symptomatic ascites EXAM: ULTRASOUND-GUIDED DIAGNOSTIC PARACENTESIS COMPARISON:  CTA PE, 12/29/2020. MR abdomen, 12/23/2020. Abdominal ultrasound, 12/18/2020., MEDICATIONS: None. COMPLICATIONS: None immediate. TECHNIQUE: Informed written consent was obtained from the patient after a discussion of the risks, benefits and alternatives to treatment. A timeout was performed prior to the initiation of the procedure. Initial ultrasound scanning demonstrates a moderate amount of ascites within the right upper abdominal quadrant. The right upper abdomen was prepped and draped in the usual sterile fashion. 1% lidocaine with epinephrine was used for local anesthesia. An ultrasound image was saved for documentation purposed. An 8 Fr Safe-T-Centesis catheter was introduced. The paracentesis was performed. The catheter was removed and a dressing was applied. The patient tolerated the procedure well without immediate post procedural complication. FINDINGS: A total of approximately 2.2 liters of bilious ascitic fluid was removed. Samples were sent to the laboratory as requested by the clinical team. IMPRESSION: Successful ultrasound-guided paracentesis yielding 2.2 liters of bilious ascitic fluid. Michaelle Birks, MD [facility] Radiology Electronically Signed   By: Michaelle Birks M.D.   On: 12/30/2020 10:08   IR Fluoro Guide CV Line Right  Result Date: 12/30/2020 INDICATION: Poor IV access. EXAM: ULTRASOUND AND FLUOROSCOPIC GUIDED PLACEMENT OF TUNNELED CENTRAL VENOUS CATHETER MEDICATIONS: None. ANESTHESIA/SEDATION: Local anesthetic was administered. FLUOROSCOPY TIME:  36 seconds (6.5 mGy) COMPLICATIONS: None immediate.  PROCEDURE: Informed written consent was obtained from the patient after a discussion of the risks, benefits, and alternatives to treatment. Questions regarding the procedure were encouraged and answered. The right neck and chest were prepped with chlorhexidine in a sterile fashion, and a sterile drape was applied covering the operative field. Maximum barrier sterile technique with sterile gowns and gloves were used for the procedure. A timeout was performed prior to the initiation of the procedure. After the overlying soft tissues were anesthetized, a small venotomy incision was created and a micropuncture kit was utilized to access the internal jugular vein. Real-time ultrasound guidance was utilized for vascular access including the acquisition of a permanent ultrasound image documenting patency of the accessed vessel. The microwire was utilized to measure appropriate catheter length. The micropuncture sheath was exchanged for a peel-away sheath over a guidewire. A 5 [place] dual lumen tunneled central venous catheter measuring 25 cm was tunneled in a retrograde fashion from the anterior chest wall to the venotomy incision. The catheter was then placed through the peel-away sheath with tip ultimately positioned at the RIGHT atrium. Final catheter positioning was confirmed and documented with a spot radiographic image. The catheter aspirates and flushes normally. The catheter was flushed with appropriate volume heparin dwells. The catheter exit site was secured with a 2-0 nylon retention suture. The venotomy incision was closed with Dermabond. Dressings were applied. The patient tolerated the procedure well without immediate post procedural complication. FINDINGS: After catheter placement, the tip lies within the RIGHT atrium the catheter aspirates and flushes normally and is ready for immediate use. IMPRESSION: Successful placement of 25 cm dual lumen  tunneled open "Power Line" central venous catheter via the right  internal jugular vein with tip terminating in the RIGHT atrium. The catheter is ready for immediate use. Michaelle Birks, MD [facility] Radiology Electronically Signed   By: Michaelle Birks M.D.   On: 12/30/2020 18:05   IR US Guide Vasc Access Right  Result Date: 12/30/2020 INDICATION: Poor IV access. EXAM: ULTRASOUND AND FLUOROSCOPIC GUIDED PLACEMENT OF TUNNELED CENTRAL VENOUS CATHETER MEDICATIONS: None. ANESTHESIA/SEDATION: Local anesthetic was administered. FLUOROSCOPY TIME:  36 seconds (6.5 mGy) COMPLICATIONS: None immediate. PROCEDURE: Informed written consent was obtained from the patient after a discussion of the risks, benefits, and alternatives to treatment. Questions regarding the procedure were encouraged and answered. The right neck and chest were prepped with chlorhexidine in a sterile fashion, and a sterile drape was applied covering the operative field. Maximum barrier sterile technique with sterile gowns and gloves were used for the procedure. A timeout was performed prior to the initiation of the procedure. After the overlying soft tissues were anesthetized, a small venotomy incision was created and a micropuncture kit was utilized to access the internal jugular vein. Real-time ultrasound guidance was utilized for vascular access including the acquisition of a permanent ultrasound image documenting patency of the accessed vessel. The microwire was utilized to measure appropriate catheter length. The micropuncture sheath was exchanged for a peel-away sheath over a guidewire. A 5 [place] dual lumen tunneled central venous catheter measuring 25 cm was tunneled in a retrograde fashion from the anterior chest wall to the venotomy incision. The catheter was then placed through the peel-away sheath with tip ultimately positioned at the RIGHT atrium. Final catheter positioning was confirmed and documented with a spot radiographic image. The catheter aspirates  and flushes normally. The catheter was flushed with appropriate volume heparin dwells. The catheter exit site was secured with a 2-0 nylon retention suture. The venotomy incision was closed with Dermabond. Dressings were applied. The patient tolerated the procedure well without immediate post procedural complication. FINDINGS: After catheter placement, the tip lies within the RIGHT atrium the catheter aspirates and flushes normally and is ready for immediate use. IMPRESSION: Successful placement of 25 cm dual lumen tunneled open "Power Line" central venous catheter via the right internal jugular vein with tip terminating in the RIGHT atrium. The catheter is ready for immediate use. Michaelle Birks, MD [facility] Radiology Electronically Signed   By: Michaelle Birks M.D.   On: 12/30/2020 18:05   DG Chest Port 1 View  Result Date: 12/28/2020 CLINICAL DATA:  Dyspnea EXAM: PORTABLE CHEST 1 VIEW COMPARISON:  12/17/2020 FINDINGS: Mild progression of moderately large right pleural effusion. There is associated collapse or consolidation in the right lung base. Decreased lung volume compared to the prior study. Increase in left lower lobe atelectasis. No edema. IMPRESSION: Hypoventilation Progressive moderately large right pleural effusion and right lower lobe atelectasis. Progressive mild left lower lobe atelectasis. Electronically Signed   By: Franchot Gallo M.D.   On: 12/28/2020 18:07   DG Chest Portable 1 View  Result Date: 12/17/2020 CLINICAL DATA:  Hypoxia EXAM: PORTABLE CHEST 1 VIEW COMPARISON:  11/16/2020 FINDINGS: Cardiac shadow is stable. Left lung remains clear. Elevation of the right hemidiaphragm is noted. Right basilar effusion and underlying atelectasis/infiltrate is present. IMPRESSION: New right basilar infiltrate and effusion. Electronically Signed   By: Inez Catalina M.D.   On: 12/17/2020 23:42   ECHOCARDIOGRAM COMPLETE  Result Date: 12/29/2020     ECHOCARDIOGRAM  REPORT   Patient Name:   CHARLEIGH CORRENTI Date of Exam: 12/29/2020 Medical Rec #:  [id number]     Height:       67.0 in Accession #:    [id number]    Weight:       256.6 lb Date of Birth:  [birth year]     BSA:          2.248 m Patient Age:    70 years      BP:           113/74 mmHg Patient Gender: F             HR:           108 bpm. Exam Location:  [facility] Procedure: 2D Echo, Cardiac Doppler and Color Doppler Indications:     Dyspnea R06.00  History:         Patient has no prior history of Echocardiogram examinations.                  Signs/Symptoms:Anxiety; Risk Factors:Diabetes.  Sonographer:     Sherrie Sport Referring Phys:  [id number] Val Riles Diagnosing Phys: Ida Rogue MD  Sonographer Comments: No apical window and no subcostal window. Pt cant lie on back nor left side. IMPRESSIONS  1. Left ventricular ejection fraction, by estimation, is 60 to 65%. The left ventricle has normal function. The left ventricle has no regional wall motion abnormalities. Left ventricular diastolic parameters are indeterminate.  2. Right ventricular systolic function is normal. The right ventricular size is normal. There is severely elevated pulmonary artery systolic pressure. The estimated right ventricular systolic pressure is 97.9 mmHg.  3. The mitral valve is normal in structure. No evidence of mitral valve regurgitation. No evidence of mitral stenosis. FINDINGS  Left Ventricle: Left ventricular ejection fraction, by estimation, is 60 to 65%. The left ventricle has normal function. The left ventricle has no regional wall motion abnormalities. The left ventricular internal cavity size was normal in size. There is  no left ventricular hypertrophy. Left ventricular diastolic parameters are indeterminate. Right Ventricle: The right ventricular size is normal. No increase in right ventricular wall thickness. Right ventricular systolic function is normal. There is severely elevated pulmonary artery systolic pressure. The  tricuspid regurgitant velocity is 3.79 m/s, and with an assumed right atrial pressure of 5 mmHg, the estimated right ventricular systolic pressure is 15.0 mmHg. Left Atrium: Left atrial size was normal in size. Right Atrium: Right atrial size was normal in size. Pericardium: There is no evidence of pericardial effusion. Mitral Valve: The mitral valve is normal in structure. No evidence of mitral valve regurgitation. No evidence of mitral valve stenosis. Tricuspid Valve: The tricuspid valve is normal in structure. Tricuspid valve regurgitation is mild . No evidence of tricuspid stenosis. Aortic Valve: The aortic valve was not well visualized. Aortic valve regurgitation is not visualized. No aortic stenosis is present. Pulmonic Valve: The pulmonic valve was normal in structure. Pulmonic valve regurgitation is not visualized. No evidence of pulmonic stenosis. Aorta: The aortic root is normal in size and structure. Venous: The pulmonary veins were not well visualized. The inferior vena cava was not well visualized. The inferior vena cava is normal in size with greater than 50% respiratory variability, suggesting right atrial pressure of 3 mmHg. IAS/Shunts: No atrial level shunt detected by color flow Doppler.  LEFT VENTRICLE PLAX 2D LVIDd:         4.90 cm LVIDs:  3.10 cm LV PW:         1.20 cm LV IVS:        0.90 cm LVOT diam:     2.00 cm LVOT Area:     3.14 cm  LEFT ATRIUM         Index LA diam:    3.60 cm 1.60 cm/m                        PULMONIC VALVE AORTA                 PV Vmax:        0.91 m/s Ao Root diam: 2.90 cm PV Peak grad:   3.3 mmHg                       RVOT Peak grad: 3 mmHg  TRICUSPID VALVE TR Peak grad:   57.5 mmHg TR Vmax:        379.00 cm/s  SHUNTS Systemic Diam: 2.00 cm Ida Rogue MD Electronically signed by Ida Rogue MD Signature Date/Time: 12/29/2020/1:27:00 PM    Final    US LIVER DOPPLER  Result Date: 12/20/2020 CLINICAL DATA:  Abnormal liver function tests. EXAM: DUPLEX  ULTRASOUND OF LIVER TECHNIQUE: Color and duplex Doppler ultrasound was performed to evaluate the hepatic in-flow and out-flow vessels. COMPARISON:  December 18, 2020. FINDINGS: Liver: Increased parenchymal echogenicity. Normal hepatic contour without nodularity. No focal lesion, mass or intrahepatic biliary ductal dilatation. Main Portal Vein size: 0.9 cm Portal Vein Velocities Main Prox:  29.4 cm/sec Main Mid: 24.8 cm/sec Main Dist:  22.2 cm/sec Right: 20.6 cm/sec Left: 19.0 cm/sec Normal hepatopetal flow is noted in the portal veins. Hepatic Vein Velocities Right:  Not visualized. Middle:  Not visualized. Left:  Not visualized. IVC: Present and patent with normal respiratory phasicity. Hepatic Artery Velocity:  48.1 cm/sec Splenic Vein Velocity:  10.1 cm/sec Spleen: 9.0 cm x 5.0 cm x 11.2 cm with a total volume of 266 cm^3 (411 cm^3 is upper limit normal) Portal Vein Occlusion/Thrombus: No Splenic Vein Occlusion/Thrombus: No Ascites: Present. Varices: None Limited exam due to body habitus and bowel gas. IMPRESSION: Limited exam due to body habitus and overlying bowel gas. There is no definite evidence of portal or splenic venous thrombosis. Due to above described limitations, hepatic veins are not visualized. Minimal to mild ascites is noted. Electronically Signed   By: Marijo Conception M.D.   On: 12/20/2020 12:10   [place] EKG SITE RITE  Result Date: 12/30/2020 If Site Rite image not attached, placement could not be confirmed due to current cardiac rhythm.  US ABDOMEN LIMITED RUQ (LIVER/GB)  Result Date: 12/18/2020 CLINICAL DATA:  Elevated liver function tests. EXAM: ULTRASOUND ABDOMEN LIMITED RIGHT UPPER QUADRANT COMPARISON:  None. FINDINGS: Gallbladder: No gallstones or wall thickening visualized (2.7 mm). No sonographic Murphy sign noted by sonographer. Common bile duct: Diameter: 2.3 mm Liver: No focal lesion identified. Diffusely increased echogenicity of the liver parenchyma is noted. Portal vein is patent  on color Doppler imaging with normal direction of blood flow towards the liver. Other: A small amount of ascites is seen within the right upper quadrant. IMPRESSION: 1. Hepatic steatosis. 2. Small amount of right upper quadrant ascites. Electronically Signed   By: Virgina Norfolk M.D.   On: 12/18/2020 02:03       Subjective: Patient reports she feels the most clear she has felt since admission Reports is the first  day she is unable to speak in clear sentences  Objective: Vitals:   12/31/20 0032 12/31/20 0513 12/31/20 0822 12/31/20 1226  BP: 108/75 111/69 108/73 125/71  Pulse: (!) 105 91 (!) 102 (!) 110  Resp: $Remo'20 18 17 17  'fPVYt$ Temp: 98.8 F (37.1 C) 100 F (37.8 C) 98 F (36.7 C) 97.6 F (36.4 C)  TempSrc:   Oral Oral  SpO2: 97% 97% 96% 97%  Weight:  109.2 kg    Height:        Intake/Output Summary (Last 24 hours) at 12/31/2020 1415 Last data filed at 12/31/2020 1300 Gross per 24 hour  Intake 360 ml  Output 1000 ml  Net -640 ml   Filed Weights   12/29/20 0500 12/30/20 0513 12/31/20 0513  Weight: 116.4 kg 114.3 kg 109.2 kg    Examination:  General exam: Appears calm and comfortable obviously jaundiced Respiratory system: Clear to auscultation. Respiratory effort normal. Cardiovascular system: S1 & S2 heard, RRR. No JVD, murmurs, rubs, gallops or clicks. No pedal edema. Gastrointestinal system: Abdomen is nondistended, soft and nontender. No organomegaly or masses felt. Normal bowel sounds heard. Central nervous system: Alert and oriented. No focal neurological deficits. Extremities: Warm well perfused Skin: No rashes, lesions or ulcers Psychiatry: Judgement and insight appear normal. Mood & affect appropriate.     Data Reviewed: I have personally reviewed following labs and imaging studies  CBC: Recent Labs  Lab 12/25/20 0452 12/28/20 0516 12/29/20 0514 12/30/20 0839 12/31/20 0540  WBC 16.0* 18.1* 17.1* 18.9* 18.2*  HGB 9.9* 8.5* 8.2* 8.6* 8.0*  HCT 32.3*  29.1* 27.8* 26.8* 26.7*  MCV 109.9* 117.8* 120.3* 113.6* 116.6*  PLT 150 165 127* PLATELET CLUMPS NOTED ON SMEAR, UNABLE TO ESTIMATE 778*   Basic Metabolic Panel: Recent Labs  Lab 12/25/20 0452 12/25/20 1046 12/27/20 2312 12/28/20 0516 12/29/20 0514 12/30/20 0839 12/30/20 1434 12/31/20 0540  NA 135   < > 138 139 139 140  --  142  K 4.1  --   --  3.9 3.7 4.5  --  3.0*  CL 98  --   --  105 105 101  --  100  CO2 27  --   --  $R'28 30 27  'MF$ --  36*  GLUCOSE 173*  --   --  253* 152* 200*  --  138*  BUN 17  --   --  23* 23* 23*  --  20  CREATININE 0.46  --   --  0.71 0.66 0.34*  --  0.52  CALCIUM 8.2*  --   --  8.1* 8.3* 8.5*  --  8.3*  MG  --   --   --  1.7 1.6*  --  1.4* 1.9  PHOS  --   --   --  3.2 2.8  --  2.2* 3.6   < > = values in this interval not displayed.   GFR: Estimated Creatinine Clearance: 114.2 mL/min (by C-G formula based on SCr of 0.52 mg/dL). Liver Function Tests: Recent Labs  Lab 12/25/20 0452 12/26/20 0420 12/28/20 0516 12/29/20 0514 12/30/20 1434 12/31/20 0540  AST 149* 148* 160* 176* 195* 189*  ALT $Re'22 26 29 'LQd$ 32 37 38  ALKPHOS 239* 211* 184* 161* 177* 165*  BILITOT 13.3* 11.9* 10.4* 10.4* 10.0* 9.2*  PROT 6.3*  --  6.4* 6.4* 6.4* 6.4*  ALBUMIN <1.5*  --  <1.5* 1.5* <1.5* <1.5*   No results for input(s): LIPASE, AMYLASE in the last 168 hours. Recent Labs  Lab 12/29/20 0521 12/30/20 1434 12/31/20 0540  AMMONIA 41* 40* 66*   Coagulation Profile: No results for input(s): INR, PROTIME in the last 168 hours. Cardiac Enzymes: No results for input(s): CKTOTAL, CKMB, CKMBINDEX, TROPONINI in the last 168 hours. BNP (last 3 results) No results for input(s): PROBNP in the last 8760 hours. HbA1C: No results for input(s): HGBA1C in the last 72 hours. CBG: Recent Labs  Lab 12/30/20 1132 12/30/20 1616 12/30/20 2109 12/31/20 0821 12/31/20 1229  GLUCAP 165* 160* 145* 169* 294*   Lipid Profile: No results for input(s): CHOL, HDL, LDLCALC, TRIG, CHOLHDL,  LDLDIRECT in the last 72 hours. Thyroid Function Tests: No results for input(s): TSH, T4TOTAL, FREET4, T3FREE, THYROIDAB in the last 72 hours. Anemia Panel: No results for input(s): VITAMINB12, FOLATE, FERRITIN, TIBC, IRON, RETICCTPCT in the last 72 hours. Sepsis Labs: Recent Labs  Lab 12/28/20 0516  PROCALCITON 1.57    Recent Results (from the past 240 hour(s))  Body fluid culture w Gram Stain     Status: None (Preliminary result)   Collection Time: 12/30/20  9:45 AM   Specimen: PATH Cytology Peritoneal fluid  Result Value Ref Range Status   Specimen Description   Final    PERITONEAL Performed at [facility], [address], [place], [place] [address]    Special Requests   Final    NONE Performed at [facility], [address] [place], [place] [address]    Gram Stain   Final    WBC PRESENT,BOTH PMN AND MONONUCLEAR NO ORGANISMS SEEN CYTOSPIN SMEAR    Culture   Final    NO GROWTH < 24 HOURS Performed at [facility], [place] [address] [place], [place] [address]    Report Status PENDING  Incomplete         Radiology Studies: CT Angio Chest Pulmonary Embolism (PE) W or WO Contrast  Result Date: 12/29/2020 CLINICAL DATA:  Inpatient.  Dyspnea.  Abnormal chest radiograph. EXAM: CT ANGIOGRAPHY CHEST WITH CONTRAST TECHNIQUE: Multidetector CT imaging of the chest was performed using the standard protocol during bolus administration of intravenous contrast. Multiplanar CT image reconstructions and MIPs were obtained to evaluate the vascular anatomy. CONTRAST:  73mL OMNIPAQUE IOHEXOL 350 MG/ML SOLN COMPARISON:  Chest radiograph from one day prior. FINDINGS: Cardiovascular: The study is high quality for the evaluation of pulmonary embolism. There are no filling defects in the central, lobar, segmental or subsegmental pulmonary artery branches to suggest acute pulmonary embolism. Normal course and caliber of the thoracic aorta. Top-normal caliber main  pulmonary artery (3.1 cm diameter). Top-normal heart size. No significant pericardial fluid/thickening. Mediastinum/Nodes: No discrete thyroid nodules. Unremarkable esophagus. No pathologically enlarged axillary, mediastinal or hilar lymph nodes. Lungs/Pleura: No pneumothorax. Small dependent right pleural effusion. No left pleural effusion. Moderate elevation of the right hemidiaphragm. Complete right lower lobe and near complete right middle lobe atelectasis. Moderate dependent left lower lobe and lingular atelectasis. No lung masses or significant pulmonary nodules in the aerated portions of the lungs. Upper abdomen: Small volume perihepatic ascites. Diffuse hepatic steatosis. Musculoskeletal: No aggressive appearing focal osseous lesions. Minimal thoracic spondylosis. Review of the MIP images confirms the above findings. IMPRESSION: 1. No pulmonary embolism. 2. Moderate elevation of the right hemidiaphragm. Complete right lower lobe and near complete right middle lobe atelectasis. Moderate dependent left lung base atelectasis. 3. Small dependent right pleural effusion. 4. Small volume perihepatic ascites. 5. Diffuse hepatic steatosis. Electronically Signed   By: Ilona Sorrel M.D.   On:  12/29/2020 18:28   US Paracentesis  Result Date: 12/30/2020 INDICATION: Symptomatic ascites EXAM: ULTRASOUND-GUIDED DIAGNOSTIC PARACENTESIS COMPARISON:  CTA PE, 12/29/2020. MR abdomen, 12/23/2020. Abdominal ultrasound, 12/18/2020., MEDICATIONS: None. COMPLICATIONS: None immediate. TECHNIQUE: Informed written consent was obtained from the patient after a discussion of the risks, benefits and alternatives to treatment. A timeout was performed prior to the initiation of the procedure. Initial ultrasound scanning demonstrates a moderate amount of ascites within the right upper abdominal quadrant. The right upper abdomen was prepped and draped in the usual sterile fashion. 1% lidocaine with epinephrine was used for local  anesthesia. An ultrasound image was saved for documentation purposed. An 8 Fr Safe-T-Centesis catheter was introduced. The paracentesis was performed. The catheter was removed and a dressing was applied. The patient tolerated the procedure well without immediate post procedural complication. FINDINGS: A total of approximately 2.2 liters of bilious ascitic fluid was removed. Samples were sent to the laboratory as requested by the clinical team. IMPRESSION: Successful ultrasound-guided paracentesis yielding 2.2 liters of bilious ascitic fluid. Michaelle Birks, MD [facility] Radiology Electronically Signed   By: Michaelle Birks M.D.   On: 12/30/2020 10:08   IR Fluoro Guide CV Line Right  Result Date: 12/30/2020 INDICATION: Poor IV access. EXAM: ULTRASOUND AND FLUOROSCOPIC GUIDED PLACEMENT OF TUNNELED CENTRAL VENOUS CATHETER MEDICATIONS: None. ANESTHESIA/SEDATION: Local anesthetic was administered. FLUOROSCOPY TIME:  36 seconds (6.5 mGy) COMPLICATIONS: None immediate. PROCEDURE: Informed written consent was obtained from the patient after a discussion of the risks, benefits, and alternatives to treatment. Questions regarding the procedure were encouraged and answered. The right neck and chest were prepped with chlorhexidine in a sterile fashion, and a sterile drape was applied covering the operative field. Maximum barrier sterile technique with sterile gowns and gloves were used for the procedure. A timeout was performed prior to the initiation of the procedure. After the overlying soft tissues were anesthetized, a small venotomy incision was created and a micropuncture kit was utilized to access the internal jugular vein. Real-time ultrasound guidance was utilized for vascular access including the acquisition of a permanent ultrasound image documenting patency of the accessed vessel. The microwire was utilized to measure appropriate catheter length. The micropuncture  sheath was exchanged for a peel-away sheath over a guidewire. A 5 [place] dual lumen tunneled central venous catheter measuring 25 cm was tunneled in a retrograde fashion from the anterior chest wall to the venotomy incision. The catheter was then placed through the peel-away sheath with tip ultimately positioned at the RIGHT atrium. Final catheter positioning was confirmed and documented with a spot radiographic image. The catheter aspirates and flushes normally. The catheter was flushed with appropriate volume heparin dwells. The catheter exit site was secured with a 2-0 nylon retention suture. The venotomy incision was closed with Dermabond. Dressings were applied. The patient tolerated the procedure well without immediate post procedural complication. FINDINGS: After catheter placement, the tip lies within the RIGHT atrium the catheter aspirates and flushes normally and is ready for immediate use. IMPRESSION: Successful placement of 25 cm dual lumen tunneled open "Power Line" central venous catheter via the right internal jugular vein with tip terminating in the RIGHT atrium. The catheter is ready for immediate use. Michaelle Birks, MD [facility] Electronically Signed   By: Michaelle Birks M.D.   On: 12/30/2020 18:05   IR US Guide Vasc Access Right  Result Date: 12/30/2020 INDICATION: Poor IV access. EXAM: ULTRASOUND AND FLUOROSCOPIC GUIDED PLACEMENT OF TUNNELED  CENTRAL VENOUS CATHETER MEDICATIONS: None. ANESTHESIA/SEDATION: Local anesthetic was administered. FLUOROSCOPY TIME:  36 seconds (6.5 mGy) COMPLICATIONS: None immediate. PROCEDURE: Informed written consent was obtained from the patient after a discussion of the risks, benefits, and alternatives to treatment. Questions regarding the procedure were encouraged and answered. The right neck and chest were prepped with chlorhexidine in a sterile fashion, and a sterile drape was applied covering the  operative field. Maximum barrier sterile technique with sterile gowns and gloves were used for the procedure. A timeout was performed prior to the initiation of the procedure. After the overlying soft tissues were anesthetized, a small venotomy incision was created and a micropuncture kit was utilized to access the internal jugular vein. Real-time ultrasound guidance was utilized for vascular access including the acquisition of a permanent ultrasound image documenting patency of the accessed vessel. The microwire was utilized to measure appropriate catheter length. The micropuncture sheath was exchanged for a peel-away sheath over a guidewire. A 5 [place] dual lumen tunneled central venous catheter measuring 25 cm was tunneled in a retrograde fashion from the anterior chest wall to the venotomy incision. The catheter was then placed through the peel-away sheath with tip ultimately positioned at the RIGHT atrium. Final catheter positioning was confirmed and documented with a spot radiographic image. The catheter aspirates and flushes normally. The catheter was flushed with appropriate volume heparin dwells. The catheter exit site was secured with a 2-0 nylon retention suture. The venotomy incision was closed with Dermabond. Dressings were applied. The patient tolerated the procedure well without immediate post procedural complication. FINDINGS: After catheter placement, the tip lies within the RIGHT atrium the catheter aspirates and flushes normally and is ready for immediate use. IMPRESSION: Successful placement of 25 cm dual lumen tunneled open "Power Line" central venous catheter via the right internal jugular vein with tip terminating in the RIGHT atrium. The catheter is ready for immediate use. Michaelle Birks, MD [facility] Radiology Electronically Signed   By: Michaelle Birks M.D.   On: 12/30/2020 18:05   [place] EKG SITE RITE  Result Date: 12/30/2020 If Site Rite  image not attached, placement could not be confirmed due to current cardiac rhythm.       Scheduled Meds:  budesonide  9 mg Oral Daily   enoxaparin (LOVENOX) injection  0.5 mg/kg Subcutaneous Q24H   feeding supplement  237 mL Oral BID BM   FLUoxetine  80 mg Oral Daily   fluticasone  1 spray Each Nare Daily   folic acid  1 mg Oral Daily   furosemide  40 mg Intravenous BID   insulin aspart  0-20 Units Subcutaneous TID WC   insulin aspart  0-5 Units Subcutaneous QHS   insulin aspart  3 Units Subcutaneous TID WC   insulin glargine-yfgn  14 Units Subcutaneous QHS   lactulose  20 g Oral TID   LORazepam  1 mg Intravenous Once   multivitamin with minerals  1 tablet Oral Daily   OLANZapine  5 mg Oral Daily   pantoprazole (PROTONIX) IV  40 mg Intravenous Q12H   pregabalin  75 mg Oral TID   rifaximin  550 mg Oral BID   thiamine  100 mg Oral Daily   Vitamin D (Ergocalciferol)  50,000 Units Oral Q7 days   Continuous Infusions:  sodium chloride 1,000 mL (12/29/20 1835)     LOS: 13 days    Time spent: 35 min    Nicolette Bang, MD [facility]  If 7PM-7AM,  please contact night-coverage  12/31/2020, 2:15 PM

## 2021-01-01 DIAGNOSIS — A419 Sepsis, unspecified organism: Secondary | ICD-10-CM | POA: Diagnosis not present

## 2021-01-01 DIAGNOSIS — K7682 Hepatic encephalopathy: Secondary | ICD-10-CM | POA: Diagnosis not present

## 2021-01-01 DIAGNOSIS — E871 Hypo-osmolality and hyponatremia: Secondary | ICD-10-CM | POA: Diagnosis not present

## 2021-01-01 DIAGNOSIS — F1020 Alcohol dependence, uncomplicated: Secondary | ICD-10-CM | POA: Diagnosis not present

## 2021-01-01 LAB — COMPREHENSIVE METABOLIC PANEL
ALT: 35 U/L (ref 0–44)
AST: 183 U/L — ABNORMAL HIGH (ref 15–41)
Albumin: <1.5 g/dL — ABNORMAL LOW (ref 3.5–5.0)
Alkaline Phosphatase: 172 U/L — ABNORMAL HIGH (ref 38–126)
Anion gap: 11 (ref 5–15)
BUN: 16 mg/dL (ref 6–20)
CO2: 34 mmol/L — ABNORMAL HIGH (ref 22–32)
Calcium: 8.4 mg/dL — ABNORMAL LOW (ref 8.9–10.3)
Chloride: 96 mmol/L — ABNORMAL LOW (ref 98–111)
Creatinine, Ser: 0.53 mg/dL (ref 0.44–1.00)
GFR, Estimated: >60 mL/min (ref >60)
Glucose, Bld: 173 mg/dL — ABNORMAL HIGH (ref 70–99)
Potassium: 3.1 mmol/L — ABNORMAL LOW (ref 3.5–5.1)
Sodium: 141 mmol/L (ref 135–145)
Total Bilirubin: 8.8 mg/dL — ABNORMAL HIGH (ref 0.3–1.2)
Total Protein: 6.3 g/dL — ABNORMAL LOW (ref 6.5–8.1)

## 2021-01-01 LAB — CBC
HCT: 27.1 % — ABNORMAL LOW (ref 36.0–46.0)
Hemoglobin: 8.6 g/dL — ABNORMAL LOW (ref 12.0–15.0)
MCH: 36.4 pg — ABNORMAL HIGH (ref 26.0–34.0)
MCHC: 31.7 g/dL (ref 30.0–36.0)
MCV: 114.8 fL — ABNORMAL HIGH (ref 80.0–100.0)
Platelets: 130 10*3/uL — ABNORMAL LOW (ref 150–400)
RBC: 2.36 MIL/uL — ABNORMAL LOW (ref 3.87–5.11)
RDW: 22.4 % — ABNORMAL HIGH (ref 11.5–15.5)
WBC: 21.2 10*3/uL — ABNORMAL HIGH (ref 4.0–10.5)
nRBC: 0.6 % — ABNORMAL HIGH (ref 0.0–0.2)

## 2021-01-01 LAB — GLUCOSE, CAPILLARY
Glucose-Capillary: 180 mg/dL — ABNORMAL HIGH (ref 70–99)
Glucose-Capillary: 163 mg/dL — ABNORMAL HIGH (ref 70–99)
Glucose-Capillary: 182 mg/dL — ABNORMAL HIGH (ref 70–99)
Glucose-Capillary: 201 mg/dL — ABNORMAL HIGH (ref 70–99)

## 2021-01-01 LAB — BASIC METABOLIC PANEL
Anion gap: 7 (ref 5–15)
BUN: 17 mg/dL (ref 6–20)
CO2: 37 mmol/L — ABNORMAL HIGH (ref 22–32)
Calcium: 8.2 mg/dL — ABNORMAL LOW (ref 8.9–10.3)
Chloride: 96 mmol/L — ABNORMAL LOW (ref 98–111)
Creatinine, Ser: 0.53 mg/dL (ref 0.44–1.00)
GFR, Estimated: >60 mL/min (ref >60)
Glucose, Bld: 174 mg/dL — ABNORMAL HIGH (ref 70–99)
Potassium: 3 mmol/L — ABNORMAL LOW (ref 3.5–5.1)
Sodium: 140 mmol/L (ref 135–145)

## 2021-01-01 LAB — AMMONIA: Ammonia: 57 umol/L — ABNORMAL HIGH (ref 9–35)

## 2021-01-01 MED ORDER — PANTOPRAZOLE SODIUM 40 MG PO TBEC
40.0000 mg | DELAYED_RELEASE_TABLET | Freq: Two times a day (BID) | ORAL | Status: DC
  Administered 2021-01-01 – 2021-01-26 (×50): 40 mg via ORAL
  Filled 2021-01-01 (×51): qty 1

## 2021-01-01 NOTE — TOC Progression Note (Signed)
Transition of Care [facility]) - Progression Note    Patient Details  Name: [name] MRN: [id number] Date of Birth: 1975/10/06  [facility] CM/SW Contact  Maree Krabbe, LCSW Phone Number: 01/01/2021, 1:51 PM  Clinical Narrative:  Pt still has no bed offers. PT will need to continue to work with pt.     Expected Discharge Plan: Skilled Nursing Facility Barriers to Discharge: Continued Medical Work up  Expected Discharge Plan and Services Expected Discharge Plan: Skilled Nursing Facility     Post Acute Care Choice: Skilled Nursing Facility Living arrangements for the past 2 months: Single Family Home                                       Social Determinants of Health (SDOH) Interventions    Readmission Risk Interventions No flowsheet data found.

## 2021-01-01 NOTE — Progress Notes (Signed)
PHARMACIST - PHYSICIAN COMMUNICATION  CONCERNING: IV to Oral Route Change Policy  RECOMMENDATION: This patient is receiving pantoprazole by the intravenous route.  Based on criteria approved by the Pharmacy and Therapeutics Committee, the intravenous medication(s) is/are being converted to the equivalent oral dose form(s).   DESCRIPTION: These criteria include: The patient is eating (either orally or via tube) and/or has been taking other orally administered medications for a least 24 hours The patient has no evidence of active gastrointestinal bleeding or impaired GI absorption (gastrectomy, short bowel, patient on TNA or NPO).  If you have questions about this conversion, please contact the Pharmacy Department    Tressie Ellis, [facility] 01/01/2021 11:04 AM

## 2021-01-01 NOTE — Progress Notes (Signed)
PROGRESS NOTE    [name]  JQB:[id number] DOB: 04-06-1975 DOA: 12/17/2020 PCP: Janine Limbo, PA-C   Brief Narrative:  [name] is a 45 y.o. female with medical history significant for Bipolar disorder, diabetes, alcohol use disorder and polysubstance abuse, FSH muscular dystrophy, with history of falls, last seen by neurology in January 2020, who was brought in by EMS after she fell at home 2 days prior and was unable to get up.   While in ED, patient was found to have severe hyponatremia of 113, potassium 2.9.   She also has an ammonia of 52, bilirubin of 11.9. She was given potassium, she was also placed on normal saline. Her chest x-ray also showed a right lower lobe infiltrates, probable aspiration pneumonia.  She is started on antibiotics with Unasyn.   The patient remains jaundiced. She appears to be in hepatic encephalopathy, although her parents at bedside stated that she was awake earlier in the day.   The patient underwent MRI abdomen/liver. No ductal dilatation or obstruction seen. Marked hepatomegaly and steatosis. No evidence of portal hypertension. No acute biliary process. It also demonstrated basilar effusions and bibasilar airspace disease right greater than left.   During my visit with the patient this morning, she was much more alert and communicative. She was agreeable to increasing her activity and getting up to chair. However, I have just been contacted by the patient's nurse and told that she is now lethargic again. I have reduced her lyrica from 150 mg to 75 mg.    Edema of the patient's lower extremities appears to be worsening.    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  Present on admission.  Due to aspiration pneumonia, was on IV vanco. MRSA screen +. The patient was converted to linezolid in preparation for dc.AND IS NOW STABLE, Hospital day 14   Acute hepatic encephalopathy Patient's mental status seems to be improving reduced lyrica from 150 mg to 75 mg.   Continue lactulose 20 gm tid. Continue Xifaxan. OOB and PT/OT. Ammonia level 41>66>57, although mental status is reported to be the best it has been to date Able to answer questions completely and engage in a full conversation   Ascites and peripheral Edema due to liver cirrhosis MRI abdomen did not show portal hypertension  Continue Lasix 40 mg IV twice daily to diurese   Severe pulmonary hypertension on TTE TTE shows LVEF 60-65%, RVSP 60 to, severe pulmonary hypertension  CTA chest to rule out PE negative for PE, elevated right diaphragm, mild pleural effusion      Acute hypoxic respiratory failure  Continue supplemental O2 inhalation and gradually weaning off Continue inhalers as needed Leukocytosis, unknown cause, procalcitonin negative, monitor off antibiotics for now Trend WBC count 10/12 CXR Hypoventilation, Progressive moderately large right pleural effusion and right lower lobe  atelectasis. Progressive mild left lower lobe atelectasis  10/13 US thoracentesis: Minimal fluid in the right pleural space. Thoracentesis not performed due to the small amount of pleural fluid. 2. Opacities in the right lower chest on recent chest radiograph probably represent a combination of elevation the right hemidiaphragm and atelectasis. Chest x-ray in  a.m.      Hyperbilirubinemia GI following.  hepatitis panel nonreactive, likely due to ischemic hepatitis.  Normal doppler liver.  MRI abdomen obtained. No biliary obstruction/distention seen. Jaundice slowly clearing. Bilirubin continues to slowly decrease   Hepatic steatosis/hepatomegaly Needs to stop alcohol. Mother wants for her to go for inpatient alcohol rehab again. I have discussed this with TOC. MRI demonstrated cirrhosis of the liver without portal hypertension or biliary duct obstruction.    Alcohol use disorder, moderate, dependence/Poly substance abuse([name]) Patient is hoping to quit drinking. She will need inpatient rehab, but this will likely have to wait until after she discharged from SNF.   Acute hyponatremia Resolved. Monitor.   Hyperkalemia, Resolved    Hypomagnesemia, mag repleted.  Recheck with morning labs   Hypophosphatemia secondary to poor nutrition, phos repleted.  Monitor and replete as needed   Macrocytic anemia secondary to liver cirrhosis, anemia work-up within normal range.    Alcohol withdrawal ([place]) CIWA protocol   Euthyroid sick syndrome Elevated TSH, normal free T4, repeat thyroid functions after 4 to 6 weeks, follow-up with PCP     10/14 Poor peripheral IV access, PICC team was consulted which was unsuccessful attempted twice.  So IR consulted for PICC line insertion which has been completed    DVT prophylaxis: Lovenox SQ  Code Status: full    Code Status Orders  (From admission, onward)           Start     Ordered   12/18/20 0513  Full code  Continuous        12/18/20 0515           Code Status History     Date Active Date Inactive Code Status Order ID Comments User Context   09/10/2018 0212 09/15/2018 1758 Full Code [id number]  Lamont Dowdy, NP Inpatient   09/26/2012 2221 10/02/2012 2135 Full Code [id number]  Orvan Falconer, MD Inpatient   11/13/2011 2332 11/15/2011 1536 Full Code [id number]  Lenis Noon, RN Inpatient       Family Communication: Discussed with patient in detail today no family at bedside Disposition Plan:    Patient not yet medically stable for discharge we will continue to titrate and monitor ammonia levels continue to monitor liver function, work continues to look for placement no bed offers yet  consults called:  gi Admission status: Inpatient     Consultants:  As above  Procedures:  CT HEAD WO CONTRAST (5MM)  Result Date: 12/17/2020 CLINICAL DATA:  Patient found down. EXAM: CT HEAD WITHOUT CONTRAST TECHNIQUE: Contiguous axial images were obtained from the base of the skull through the vertex without intravenous contrast. COMPARISON:  None. FINDINGS: Brain: No evidence of acute infarction, hemorrhage, hydrocephalus, extra-axial collection or mass lesion/mass effect. Vascular: No hyperdense vessel or unexpected calcification. Skull: Normal. Negative for fracture or focal lesion. Sinuses/Orbits: No acute finding. Other: None. IMPRESSION: No acute intracranial pathology. Electronically Signed   By: Virgina Norfolk M.D.   On: 12/17/2020 23:51   CT Angio Chest Pulmonary Embolism (PE) W or WO Contrast  Result Date: 12/29/2020 CLINICAL DATA:  Inpatient.  Dyspnea.  Abnormal chest radiograph. EXAM: CT ANGIOGRAPHY CHEST WITH CONTRAST  TECHNIQUE: Multidetector CT imaging of the chest was performed using the standard protocol during bolus administration of intravenous contrast. Multiplanar CT image reconstructions and MIPs were obtained to evaluate the vascular anatomy. CONTRAST:  61mL OMNIPAQUE IOHEXOL 350 MG/ML SOLN COMPARISON:  Chest radiograph from one day prior. FINDINGS: Cardiovascular: The study is high quality for the evaluation of pulmonary embolism. There are no filling defects in the central, lobar, segmental or subsegmental pulmonary artery branches to suggest acute pulmonary embolism. Normal course and caliber of the thoracic aorta. Top-normal caliber main pulmonary artery (3.1 cm  diameter). Top-normal heart size. No significant pericardial fluid/thickening. Mediastinum/Nodes: No discrete thyroid nodules. Unremarkable esophagus. No pathologically enlarged axillary, mediastinal or hilar lymph nodes. Lungs/Pleura: No pneumothorax. Small dependent right pleural effusion. No left pleural effusion. Moderate elevation of the right hemidiaphragm. Complete right lower lobe and near complete right middle lobe atelectasis. Moderate dependent left lower lobe and lingular atelectasis. No lung masses or significant pulmonary nodules in the aerated portions of the lungs. Upper abdomen: Small volume perihepatic ascites. Diffuse hepatic steatosis. Musculoskeletal: No aggressive appearing focal osseous lesions. Minimal thoracic spondylosis. Review of the MIP images confirms the above findings. IMPRESSION: 1. No pulmonary embolism. 2. Moderate elevation of the right hemidiaphragm. Complete right lower lobe and near complete right middle lobe atelectasis. Moderate dependent left lung base atelectasis. 3. Small dependent right pleural effusion. 4. Small volume perihepatic ascites. 5. Diffuse hepatic steatosis. Electronically Signed   By: Ilona Sorrel M.D.   On: 12/29/2020 18:28   [place] CHEST (PLEURAL EFFUSION)  Result Date: 12/29/2020 CLINICAL DATA:  Evaluate right pleural effusion for thoracentesis. EXAM: CHEST ULTRASOUND COMPARISON:  Chest radiograph 12/28/2020 FINDINGS: Minimal fluid in the right pleural space. IMPRESSION: 1. Minimal fluid in the right pleural space. Thoracentesis not performed due to the small amount of pleural fluid. 2. Opacities in the right lower chest on recent chest radiograph probably represent a combination of elevation the right hemidiaphragm and atelectasis. Electronically Signed   By: Markus Daft M.D.   On: 12/29/2020 12:25   MR LIVER W WO CONTRAST  Result Date: 12/23/2020 CLINICAL DATA:  A 45 year old female presents with jaundice and suspected hepatic encephalopathy. EXAM:  MRI ABDOMEN WITHOUT AND WITH CONTRAST TECHNIQUE: Multiplanar multisequence MR imaging of the abdomen was performed both before and after the administration of intravenous contrast. CONTRAST:  94mL GADAVIST GADOBUTROL 1 MMOL/ML IV SOLN COMPARISON:  Comparison made with abdominal sonogram which was performed on October second of 2022 and hepatic Doppler performed on December 20, 2020. FINDINGS: Lower chest: RIGHT effusion and basilar airspace disease, not well assessed in the setting of elevation of the RIGHT hemidiaphragm likely at least partially secondary to marked hepatomegaly. Small LEFT effusion. Lung base assessment is limited on MRI. Hepatobiliary: Marked hepatomegaly and marked hepatic steatosis. Dedicated MRCP images were not performed though there is no sign of intra or extrahepatic biliary duct distension. Gallbladder is collapsed. Portal and hepatic veins are patent. Pancreas: Normal intrinsic T1 signal. No ductal dilation or sign of inflammation. No focal lesion. Spleen:  Spleen normal in size and contour. Adrenals/Urinary Tract: Adrenal glands are normal. No suspicious renal lesion or hydronephrosis. No perinephric stranding. Stomach/Bowel: Gastrointestinal tract shows no acute process on limited assessment to the extent evaluated on abdominal MRI. Vascular/Lymphatic: No pathologically enlarged lymph nodes identified. No abdominal aortic aneurysm demonstrated. Other:  Small volume ascites. Musculoskeletal: No suspicious bone lesions identified. Marked muscular atrophy in this patient with history of muscular dystrophy. IMPRESSION: Marked hepatomegaly and marked hepatic  steatosis. Lobular hepatic contours and marked hepatomegaly with liver span exceeding 28 cm. In the setting of ascites would correlate with any signs of steatohepatitis. No current evidence of portal hypertension. No acute biliary process. Note the dedicated MRCP images were not performed though there is no biliary duct dilation and the  gallbladder is completely collapsed. No biliary duct distension. Basilar effusions and bibasilar airspace disease RIGHT greater than LEFT. Cardiomegaly not well assessed. Electronically Signed   By: Zetta Bills M.D.   On: 12/23/2020 14:59   US Paracentesis  Result Date: 12/30/2020 INDICATION: Symptomatic ascites EXAM: ULTRASOUND-GUIDED DIAGNOSTIC PARACENTESIS COMPARISON:  CTA PE, 12/29/2020. MR abdomen, 12/23/2020. Abdominal ultrasound, 12/18/2020., MEDICATIONS: None. COMPLICATIONS: None immediate. TECHNIQUE: Informed written consent was obtained from the patient after a discussion of the risks, benefits and alternatives to treatment. A timeout was performed prior to the initiation of the procedure. Initial ultrasound scanning demonstrates a moderate amount of ascites within the right upper abdominal quadrant. The right upper abdomen was prepped and draped in the usual sterile fashion. 1% lidocaine with epinephrine was used for local anesthesia. An ultrasound image was saved for documentation purposed. An 8 Fr Safe-T-Centesis catheter was introduced. The paracentesis was performed. The catheter was removed and a dressing was applied. The patient tolerated the procedure well without immediate post procedural complication. FINDINGS: A total of approximately 2.2 liters of bilious ascitic fluid was removed. Samples were sent to the laboratory as requested by the clinical team. IMPRESSION: Successful ultrasound-guided paracentesis yielding 2.2 liters of bilious ascitic fluid. Michaelle Birks, MD [facility] Radiology Electronically Signed   By: Michaelle Birks M.D.   On: 12/30/2020 10:08   IR Fluoro Guide CV Line Right  Result Date: 12/30/2020 INDICATION: Poor IV access. EXAM: ULTRASOUND AND FLUOROSCOPIC GUIDED PLACEMENT OF TUNNELED CENTRAL VENOUS CATHETER MEDICATIONS: None. ANESTHESIA/SEDATION: Local anesthetic was administered. FLUOROSCOPY TIME:  36 seconds (6.5  mGy) COMPLICATIONS: None immediate. PROCEDURE: Informed written consent was obtained from the patient after a discussion of the risks, benefits, and alternatives to treatment. Questions regarding the procedure were encouraged and answered. The right neck and chest were prepped with chlorhexidine in a sterile fashion, and a sterile drape was applied covering the operative field. Maximum barrier sterile technique with sterile gowns and gloves were used for the procedure. A timeout was performed prior to the initiation of the procedure. After the overlying soft tissues were anesthetized, a small venotomy incision was created and a micropuncture kit was utilized to access the internal jugular vein. Real-time ultrasound guidance was utilized for vascular access including the acquisition of a permanent ultrasound image documenting patency of the accessed vessel. The microwire was utilized to measure appropriate catheter length. The micropuncture sheath was exchanged for a peel-away sheath over a guidewire. A 5 [place] dual lumen tunneled central venous catheter measuring 25 cm was tunneled in a retrograde fashion from the anterior chest wall to the venotomy incision. The catheter was then placed through the peel-away sheath with tip ultimately positioned at the RIGHT atrium. Final catheter positioning was confirmed and documented with a spot radiographic image. The catheter aspirates and flushes normally. The catheter was flushed with appropriate volume heparin dwells. The catheter exit site was secured with a 2-0 nylon retention suture. The venotomy incision was closed with Dermabond. Dressings were applied. The patient tolerated the procedure well without immediate post procedural complication. FINDINGS: After catheter placement, the tip lies within the RIGHT atrium the catheter aspirates and flushes normally and is ready  for immediate use. IMPRESSION: Successful placement of 25 cm dual lumen tunneled open "Power Line"  central venous catheter via the right internal jugular vein with tip terminating in the RIGHT atrium. The catheter is ready for immediate use. Roanna Banning, MD [facility] Radiology Electronically Signed   By: Roanna Banning M.D.   On: 12/30/2020 18:05   IR US Guide Vasc Access Right  Result Date: 12/30/2020 INDICATION: Poor IV access. EXAM: ULTRASOUND AND FLUOROSCOPIC GUIDED PLACEMENT OF TUNNELED CENTRAL VENOUS CATHETER MEDICATIONS: None. ANESTHESIA/SEDATION: Local anesthetic was administered. FLUOROSCOPY TIME:  36 seconds (6.5 mGy) COMPLICATIONS: None immediate. PROCEDURE: Informed written consent was obtained from the patient after a discussion of the risks, benefits, and alternatives to treatment. Questions regarding the procedure were encouraged and answered. The right neck and chest were prepped with chlorhexidine in a sterile fashion, and a sterile drape was applied covering the operative field. Maximum barrier sterile technique with sterile gowns and gloves were used for the procedure. A timeout was performed prior to the initiation of the procedure. After the overlying soft tissues were anesthetized, a small venotomy incision was created and a micropuncture kit was utilized to access the internal jugular vein. Real-time ultrasound guidance was utilized for vascular access including the acquisition of a permanent ultrasound image documenting patency of the accessed vessel. The microwire was utilized to measure appropriate catheter length. The micropuncture sheath was exchanged for a peel-away sheath over a guidewire. A 5 [place] dual lumen tunneled central venous catheter measuring 25 cm was tunneled in a retrograde fashion from the anterior chest wall to the venotomy incision. The catheter was then placed through the peel-away sheath with tip ultimately positioned at the RIGHT atrium. Final catheter positioning was confirmed and documented with a spot  radiographic image. The catheter aspirates and flushes normally. The catheter was flushed with appropriate volume heparin dwells. The catheter exit site was secured with a 2-0 nylon retention suture. The venotomy incision was closed with Dermabond. Dressings were applied. The patient tolerated the procedure well without immediate post procedural complication. FINDINGS: After catheter placement, the tip lies within the RIGHT atrium the catheter aspirates and flushes normally and is ready for immediate use. IMPRESSION: Successful placement of 25 cm dual lumen tunneled open "Power Line" central venous catheter via the right internal jugular vein with tip terminating in the RIGHT atrium. The catheter is ready for immediate use. Roanna Banning, MD [facility] Radiology Electronically Signed   By: Roanna Banning M.D.   On: 12/30/2020 18:05   DG Chest Port 1 View  Result Date: 12/28/2020 CLINICAL DATA:  Dyspnea EXAM: PORTABLE CHEST 1 VIEW COMPARISON:  12/17/2020 FINDINGS: Mild progression of moderately large right pleural effusion. There is associated collapse or consolidation in the right lung base. Decreased lung volume compared to the prior study. Increase in left lower lobe atelectasis. No edema. IMPRESSION: Hypoventilation Progressive moderately large right pleural effusion and right lower lobe atelectasis. Progressive mild left lower lobe atelectasis. Electronically Signed   By: Marlan Palau M.D.   On: 12/28/2020 18:07   DG Chest Portable 1 View  Result Date: 12/17/2020 CLINICAL DATA:  Hypoxia EXAM: PORTABLE CHEST 1 VIEW COMPARISON:  11/16/2020 FINDINGS: Cardiac shadow is stable. Left lung remains clear. Elevation of the right hemidiaphragm is noted. Right basilar effusion and underlying atelectasis/infiltrate is present. IMPRESSION: New right basilar infiltrate and effusion. Electronically Signed   By: Alcide Clever M.D.   On: 12/17/2020 23:42  ECHOCARDIOGRAM  COMPLETE  Result Date: 12/29/2020    ECHOCARDIOGRAM REPORT   Patient Name:   PARVEEN FREEHLING Date of Exam: 12/29/2020 Medical Rec #:  [id number]     Height:       67.0 in Accession #:    [id number]    Weight:       256.6 lb Date of Birth:  12/23/1975     BSA:          2.248 m Patient Age:    31 years      BP:           113/74 mmHg Patient Gender: F             HR:           108 bpm. Exam Location:  [facility] Procedure: 2D Echo, Cardiac Doppler and Color Doppler Indications:     Dyspnea R06.00  History:         Patient has no prior history of Echocardiogram examinations.                  Signs/Symptoms:Anxiety; Risk Factors:Diabetes.  Sonographer:     Sherrie Sport Referring Phys:  [id number] Val Riles Diagnosing Phys: Ida Rogue MD  Sonographer Comments: No apical window and no subcostal window. Pt cant lie on back nor left side. IMPRESSIONS  1. Left ventricular ejection fraction, by estimation, is 60 to 65%. The left ventricle has normal function. The left ventricle has no regional wall motion abnormalities. Left ventricular diastolic parameters are indeterminate.  2. Right ventricular systolic function is normal. The right ventricular size is normal. There is severely elevated pulmonary artery systolic pressure. The estimated right ventricular systolic pressure is 28.1 mmHg.  3. The mitral valve is normal in structure. No evidence of mitral valve regurgitation. No evidence of mitral stenosis. FINDINGS  Left Ventricle: Left ventricular ejection fraction, by estimation, is 60 to 65%. The left ventricle has normal function. The left ventricle has no regional wall motion abnormalities. The left ventricular internal cavity size was normal in size. There is  no left ventricular hypertrophy. Left ventricular diastolic parameters are indeterminate. Right Ventricle: The right ventricular size is normal. No increase in right ventricular wall thickness. Right ventricular systolic function is normal. There is severely elevated  pulmonary artery systolic pressure. The tricuspid regurgitant velocity is 3.79 m/s, and with an assumed right atrial pressure of 5 mmHg, the estimated right ventricular systolic pressure is 18.8 mmHg. Left Atrium: Left atrial size was normal in size. Right Atrium: Right atrial size was normal in size. Pericardium: There is no evidence of pericardial effusion. Mitral Valve: The mitral valve is normal in structure. No evidence of mitral valve regurgitation. No evidence of mitral valve stenosis. Tricuspid Valve: The tricuspid valve is normal in structure. Tricuspid valve regurgitation is mild . No evidence of tricuspid stenosis. Aortic Valve: The aortic valve was not well visualized. Aortic valve regurgitation is not visualized. No aortic stenosis is present. Pulmonic Valve: The pulmonic valve was normal in structure. Pulmonic valve regurgitation is not visualized. No evidence of pulmonic stenosis. Aorta: The aortic root is normal in size and structure. Venous: The pulmonary veins were not well visualized. The inferior vena cava was not well visualized. The inferior vena cava is normal in size with greater than 50% respiratory variability, suggesting right atrial pressure of 3 mmHg. IAS/Shunts: No atrial level shunt detected by color flow Doppler.  LEFT VENTRICLE PLAX 2D LVIDd:  4.90 cm LVIDs:         3.10 cm LV PW:         1.20 cm LV IVS:        0.90 cm LVOT diam:     2.00 cm LVOT Area:     3.14 cm  LEFT ATRIUM         Index LA diam:    3.60 cm 1.60 cm/m                        PULMONIC VALVE AORTA                 PV Vmax:        0.91 m/s Ao Root diam: 2.90 cm PV Peak grad:   3.3 mmHg                       RVOT Peak grad: 3 mmHg  TRICUSPID VALVE TR Peak grad:   57.5 mmHg TR Vmax:        379.00 cm/s  SHUNTS Systemic Diam: 2.00 cm Ida Rogue MD Electronically signed by Ida Rogue MD Signature Date/Time: 12/29/2020/1:27:00 PM    Final    US LIVER DOPPLER  Result Date: 12/20/2020 CLINICAL DATA:   Abnormal liver function tests. EXAM: DUPLEX ULTRASOUND OF LIVER TECHNIQUE: Color and duplex Doppler ultrasound was performed to evaluate the hepatic in-flow and out-flow vessels. COMPARISON:  December 18, 2020. FINDINGS: Liver: Increased parenchymal echogenicity. Normal hepatic contour without nodularity. No focal lesion, mass or intrahepatic biliary ductal dilatation. Main Portal Vein size: 0.9 cm Portal Vein Velocities Main Prox:  29.4 cm/sec Main Mid: 24.8 cm/sec Main Dist:  22.2 cm/sec Right: 20.6 cm/sec Left: 19.0 cm/sec Normal hepatopetal flow is noted in the portal veins. Hepatic Vein Velocities Right:  Not visualized. Middle:  Not visualized. Left:  Not visualized. IVC: Present and patent with normal respiratory phasicity. Hepatic Artery Velocity:  48.1 cm/sec Splenic Vein Velocity:  10.1 cm/sec Spleen: 9.0 cm x 5.0 cm x 11.2 cm with a total volume of 266 cm^3 (411 cm^3 is upper limit normal) Portal Vein Occlusion/Thrombus: No Splenic Vein Occlusion/Thrombus: No Ascites: Present. Varices: None Limited exam due to body habitus and bowel gas. IMPRESSION: Limited exam due to body habitus and overlying bowel gas. There is no definite evidence of portal or splenic venous thrombosis. Due to above described limitations, hepatic veins are not visualized. Minimal to mild ascites is noted. Electronically Signed   By: Marijo Conception M.D.   On: 12/20/2020 12:10   [place] EKG SITE RITE  Result Date: 12/30/2020 If Site Rite image not attached, placement could not be confirmed due to current cardiac rhythm.  US ABDOMEN LIMITED RUQ (LIVER/GB)  Result Date: 12/18/2020 CLINICAL DATA:  Elevated liver function tests. EXAM: ULTRASOUND ABDOMEN LIMITED RIGHT UPPER QUADRANT COMPARISON:  None. FINDINGS: Gallbladder: No gallstones or wall thickening visualized (2.7 mm). No sonographic Murphy sign noted by sonographer. Common bile duct: Diameter: 2.3 mm Liver: No focal lesion identified. Diffusely increased echogenicity of the  liver parenchyma is noted. Portal vein is patent on color Doppler imaging with normal direction of blood flow towards the liver. Other: A small amount of ascites is seen within the right upper quadrant. IMPRESSION: 1. Hepatic steatosis. 2. Small amount of right upper quadrant ascites. Electronically Signed   By: Virgina Norfolk M.D.   On: 12/18/2020 02:03       Subjective: Patient reports continues to feel  better, mental status improving considerably  Objective: Vitals:   01/01/21 0700 01/01/21 1101 01/01/21 1127 01/01/21 1616  BP: 111/65 117/73 111/64 118/68  Pulse: 97 (!) 111 (!) 101 (!) 107  Resp: $Remo'19 17 14 17  'qmCBN$ Temp: 98.7 F (37.1 C) 98.7 F (37.1 C)  98.7 F (37.1 C)  TempSrc: Oral     SpO2:  (!) 88% 100% 92%  Weight:      Height:        Intake/Output Summary (Last 24 hours) at 01/01/2021 1622 Last data filed at 01/01/2021 1209 Gross per 24 hour  Intake 360 ml  Output 1975 ml  Net -1615 ml   Filed Weights   12/30/20 0513 12/31/20 0513 01/01/21 0543  Weight: 114.3 kg 109.2 kg 106.5 kg    Examination:  General exam: Appears calm and comfortable  Respiratory system: Clear to auscultation. Respiratory effort normal. Cardiovascular system: S1 & S2 heard, RRR. No JVD, murmurs, rubs, gallops or clicks. No pedal edema. Gastrointestinal system: Abdomen is nondistended, soft and nontender. No organomegaly or masses felt. Normal bowel sounds heard. Central nervous system: Alert and oriented. No focal neurological deficits. Extremities: Symmetric 5 x 5 power. Skin: No rashes, lesions or ulcers Psychiatry: Judgement and insight appear normal. Mood & affect appropriate.     Data Reviewed: I have personally reviewed following labs and imaging studies  CBC: Recent Labs  Lab 12/28/20 0516 12/29/20 0514 12/30/20 0839 12/31/20 0540 01/01/21 0556  WBC 18.1* 17.1* 18.9* 18.2* 21.2*  HGB 8.5* 8.2* 8.6* 8.0* 8.6*  HCT 29.1* 27.8* 26.8* 26.7* 27.1*  MCV 117.8* 120.3* 113.6*  116.6* 114.8*  PLT 165 127* PLATELET CLUMPS NOTED ON SMEAR, UNABLE TO ESTIMATE 120* 786*   Basic Metabolic Panel: Recent Labs  Lab 12/28/20 0516 12/29/20 0514 12/30/20 0839 12/30/20 1434 12/31/20 0540 01/01/21 0556  NA 139 139 140  --  142 141  140  K 3.9 3.7 4.5  --  3.0* 3.1*  3.0*  CL 105 105 101  --  100 96*  96*  CO2 $Re'28 30 27  'Wzu$ --  36* 34*  37*  GLUCOSE 253* 152* 200*  --  138* 173*  174*  BUN 23* 23* 23*  --  $R'20 16  17  'xK$ CREATININE 0.71 0.66 0.34*  --  0.52 0.53  0.53  CALCIUM 8.1* 8.3* 8.5*  --  8.3* 8.4*  8.2*  MG 1.7 1.6*  --  1.4* 1.9  --   PHOS 3.2 2.8  --  2.2* 3.6  --    GFR: Estimated Creatinine Clearance: 112.8 mL/min (by C-G formula based on SCr of 0.53 mg/dL). Liver Function Tests: Recent Labs  Lab 12/28/20 0516 12/29/20 0514 12/30/20 1434 12/31/20 0540 01/01/21 0556  AST 160* 176* 195* 189* 183*  ALT 29 32 37 38 35  ALKPHOS 184* 161* 177* 165* 172*  BILITOT 10.4* 10.4* 10.0* 9.2* 8.8*  PROT 6.4* 6.4* 6.4* 6.4* 6.3*  ALBUMIN <1.5* 1.5* <1.5* <1.5* <1.5*   No results for input(s): LIPASE, AMYLASE in the last 168 hours. Recent Labs  Lab 12/29/20 0521 12/30/20 1434 12/31/20 0540 01/01/21 0556  AMMONIA 41* 40* 66* 57*   Coagulation Profile: No results for input(s): INR, PROTIME in the last 168 hours. Cardiac Enzymes: No results for input(s): CKTOTAL, CKMB, CKMBINDEX, TROPONINI in the last 168 hours. BNP (last 3 results) No results for input(s): PROBNP in the last 8760 hours. HbA1C: No results for input(s): HGBA1C in the last 72 hours. CBG: Recent Labs  Lab  12/31/20 1229 12/31/20 1610 12/31/20 2040 01/01/21 0737 01/01/21 1105  GLUCAP 294* 171* 253* 180* 163*   Lipid Profile: No results for input(s): CHOL, HDL, LDLCALC, TRIG, CHOLHDL, LDLDIRECT in the last 72 hours. Thyroid Function Tests: No results for input(s): TSH, T4TOTAL, FREET4, T3FREE, THYROIDAB in the last 72 hours. Anemia Panel: No results for input(s): VITAMINB12,  FOLATE, FERRITIN, TIBC, IRON, RETICCTPCT in the last 72 hours. Sepsis Labs: Recent Labs  Lab 12/28/20 0516  PROCALCITON 1.57    Recent Results (from the past 240 hour(s))  Body fluid culture w Gram Stain     Status: None (Preliminary result)   Collection Time: 12/30/20  9:45 AM   Specimen: PATH Cytology Peritoneal fluid  Result Value Ref Range Status   Specimen Description   Final    PERITONEAL Performed at [facility], [address] [place], [place] [address]    Special Requests   Final    NONE Performed at [facility], [place] [place] [address], [place] [address]    Gram Stain   Final    WBC PRESENT,BOTH PMN AND MONONUCLEAR NO ORGANISMS SEEN CYTOSPIN SMEAR    Culture   Final    NO GROWTH 2 DAYS Performed at [facility], [place] [address], [place],  [address]    Report Status PENDING  Incomplete         Radiology Studies: IR Fluoro Guide CV Line Right  Result Date: 12/30/2020 INDICATION: Poor IV access. EXAM: ULTRASOUND AND FLUOROSCOPIC GUIDED PLACEMENT OF TUNNELED CENTRAL VENOUS CATHETER MEDICATIONS: None. ANESTHESIA/SEDATION: Local anesthetic was administered. FLUOROSCOPY TIME:  36 seconds (6.5 mGy) COMPLICATIONS: None immediate. PROCEDURE: Informed written consent was obtained from the patient after a discussion of the risks, benefits, and alternatives to treatment. Questions regarding the procedure were encouraged and answered. The right neck and chest were prepped with chlorhexidine in a sterile fashion, and a sterile drape was applied covering the operative field. Maximum barrier sterile technique with sterile gowns and gloves were used for the procedure. A timeout was performed prior to the initiation of the procedure. After the overlying soft tissues were anesthetized, a small venotomy incision was created and a micropuncture kit was utilized to access the internal jugular vein. Real-time ultrasound guidance was utilized for vascular  access including the acquisition of a permanent ultrasound image documenting patency of the accessed vessel. The microwire was utilized to measure appropriate catheter length. The micropuncture sheath was exchanged for a peel-away sheath over a guidewire. A 5 [place] dual lumen tunneled central venous catheter measuring 25 cm was tunneled in a retrograde fashion from the anterior chest wall to the venotomy incision. The catheter was then placed through the peel-away sheath with tip ultimately positioned at the RIGHT atrium. Final catheter positioning was confirmed and documented with a spot radiographic image. The catheter aspirates and flushes normally. The catheter was flushed with appropriate volume heparin dwells. The catheter exit site was secured with a 2-0 nylon retention suture. The venotomy incision was closed with Dermabond. Dressings were applied. The patient tolerated the procedure well without immediate post procedural complication. FINDINGS: After catheter placement, the tip lies within the RIGHT atrium the catheter aspirates and flushes normally and is ready for immediate use. IMPRESSION: Successful placement of 25 cm dual lumen tunneled open "Power Line" central venous catheter via the right internal jugular vein with tip terminating in the RIGHT atrium. The catheter is ready for immediate use. Michaelle Birks, MD [facility] Radiology  Electronically Signed   By: Michaelle Birks M.D.   On: 12/30/2020 18:05   IR US Guide Vasc Access Right  Result Date: 12/30/2020 INDICATION: Poor IV access. EXAM: ULTRASOUND AND FLUOROSCOPIC GUIDED PLACEMENT OF TUNNELED CENTRAL VENOUS CATHETER MEDICATIONS: None. ANESTHESIA/SEDATION: Local anesthetic was administered. FLUOROSCOPY TIME:  36 seconds (6.5 mGy) COMPLICATIONS: None immediate. PROCEDURE: Informed written consent was obtained from the patient after a discussion of the risks, benefits, and alternatives to treatment.  Questions regarding the procedure were encouraged and answered. The right neck and chest were prepped with chlorhexidine in a sterile fashion, and a sterile drape was applied covering the operative field. Maximum barrier sterile technique with sterile gowns and gloves were used for the procedure. A timeout was performed prior to the initiation of the procedure. After the overlying soft tissues were anesthetized, a small venotomy incision was created and a micropuncture kit was utilized to access the internal jugular vein. Real-time ultrasound guidance was utilized for vascular access including the acquisition of a permanent ultrasound image documenting patency of the accessed vessel. The microwire was utilized to measure appropriate catheter length. The micropuncture sheath was exchanged for a peel-away sheath over a guidewire. A 5 [place] dual lumen tunneled central venous catheter measuring 25 cm was tunneled in a retrograde fashion from the anterior chest wall to the venotomy incision. The catheter was then placed through the peel-away sheath with tip ultimately positioned at the RIGHT atrium. Final catheter positioning was confirmed and documented with a spot radiographic image. The catheter aspirates and flushes normally. The catheter was flushed with appropriate volume heparin dwells. The catheter exit site was secured with a 2-0 nylon retention suture. The venotomy incision was closed with Dermabond. Dressings were applied. The patient tolerated the procedure well without immediate post procedural complication. FINDINGS: After catheter placement, the tip lies within the RIGHT atrium the catheter aspirates and flushes normally and is ready for immediate use. IMPRESSION: Successful placement of 25 cm dual lumen tunneled open "Power Line" central venous catheter via the right internal jugular vein with tip terminating in the RIGHT atrium. The catheter is ready for immediate use. Michaelle Birks, MD [facility] Radiology Electronically Signed   By: Michaelle Birks M.D.   On: 12/30/2020 18:05        Scheduled Meds:  budesonide  9 mg Oral Daily   enoxaparin (LOVENOX) injection  0.5 mg/kg Subcutaneous Q24H   feeding supplement  237 mL Oral BID BM   FLUoxetine  80 mg Oral Daily   fluticasone  1 spray Each Nare Daily   folic acid  1 mg Oral Daily   furosemide  40 mg Intravenous BID   insulin aspart  0-20 Units Subcutaneous TID WC   insulin aspart  0-5 Units Subcutaneous QHS   insulin aspart  3 Units Subcutaneous TID WC   insulin glargine-yfgn  14 Units Subcutaneous QHS   lactulose  20 g Oral TID   multivitamin with minerals  1 tablet Oral Daily   OLANZapine  5 mg Oral Daily   pantoprazole  40 mg Oral BID   pregabalin  75 mg Oral TID   rifaximin  550 mg Oral BID   thiamine  100 mg Oral Daily   Vitamin D (Ergocalciferol)  50,000 Units Oral Q7 days   Continuous Infusions:  sodium chloride 1,000 mL (12/29/20 1835)     LOS: 14 days    Time spent: 35 min    Nicolette Bang, MD [facility]  If 7PM-7AM, please contact night-coverage  01/01/2021, 4:22 PM

## 2021-01-02 ENCOUNTER — Inpatient Hospital Stay: Payer: Medicare Other

## 2021-01-02 DIAGNOSIS — F10930 Alcohol use, unspecified with withdrawal, uncomplicated: Secondary | ICD-10-CM | POA: Diagnosis not present

## 2021-01-02 DIAGNOSIS — E871 Hypo-osmolality and hyponatremia: Secondary | ICD-10-CM | POA: Diagnosis not present

## 2021-01-02 DIAGNOSIS — K7682 Hepatic encephalopathy: Secondary | ICD-10-CM | POA: Diagnosis not present

## 2021-01-02 DIAGNOSIS — A419 Sepsis, unspecified organism: Secondary | ICD-10-CM | POA: Diagnosis not present

## 2021-01-02 LAB — COMPREHENSIVE METABOLIC PANEL
ALT: 34 U/L (ref 0–44)
AST: 178 U/L — ABNORMAL HIGH (ref 15–41)
Albumin: <1.5 g/dL — ABNORMAL LOW (ref 3.5–5.0)
Alkaline Phosphatase: 183 U/L — ABNORMAL HIGH (ref 38–126)
Anion gap: 8 (ref 5–15)
BUN: 16 mg/dL (ref 6–20)
CO2: 37 mmol/L — ABNORMAL HIGH (ref 22–32)
Calcium: 7.9 mg/dL — ABNORMAL LOW (ref 8.9–10.3)
Chloride: 94 mmol/L — ABNORMAL LOW (ref 98–111)
Creatinine, Ser: 0.5 mg/dL (ref 0.44–1.00)
GFR, Estimated: >60 mL/min (ref >60)
Glucose, Bld: 195 mg/dL — ABNORMAL HIGH (ref 70–99)
Potassium: 3 mmol/L — ABNORMAL LOW (ref 3.5–5.1)
Sodium: 139 mmol/L (ref 135–145)
Total Bilirubin: 9.2 mg/dL — ABNORMAL HIGH (ref 0.3–1.2)
Total Protein: 6.1 g/dL — ABNORMAL LOW (ref 6.5–8.1)

## 2021-01-02 LAB — CBC
HCT: 28 % — ABNORMAL LOW (ref 36.0–46.0)
Hemoglobin: 8.6 g/dL — ABNORMAL LOW (ref 12.0–15.0)
MCH: 34.7 pg — ABNORMAL HIGH (ref 26.0–34.0)
MCHC: 30.7 g/dL (ref 30.0–36.0)
MCV: 112.9 fL — ABNORMAL HIGH (ref 80.0–100.0)
Platelets: 153 10*3/uL (ref 150–400)
RBC: 2.48 MIL/uL — ABNORMAL LOW (ref 3.87–5.11)
RDW: 22.5 % — ABNORMAL HIGH (ref 11.5–15.5)
WBC: 24.4 10*3/uL — ABNORMAL HIGH (ref 4.0–10.5)
nRBC: 0.6 % — ABNORMAL HIGH (ref 0.0–0.2)

## 2021-01-02 LAB — BODY FLUID CULTURE W GRAM STAIN: Culture: NO GROWTH

## 2021-01-02 LAB — GLUCOSE, CAPILLARY
Glucose-Capillary: 178 mg/dL — ABNORMAL HIGH (ref 70–99)
Glucose-Capillary: 211 mg/dL — ABNORMAL HIGH (ref 70–99)
Glucose-Capillary: 282 mg/dL — ABNORMAL HIGH (ref 70–99)
Glucose-Capillary: 206 mg/dL — ABNORMAL HIGH (ref 70–99)

## 2021-01-02 LAB — AMMONIA: Ammonia: 46 umol/L — ABNORMAL HIGH (ref 9–35)

## 2021-01-02 MED ORDER — DICYCLOMINE HCL 20 MG PO TABS
20.0000 mg | ORAL_TABLET | Freq: Once | ORAL | Status: AC
  Administered 2021-01-02: 20 mg via ORAL
  Filled 2021-01-02: qty 1

## 2021-01-02 MED ORDER — POTASSIUM CHLORIDE 10 MEQ/100ML IV SOLN
10.0000 meq | INTRAVENOUS | Status: AC
  Administered 2021-01-02 (×4): 10 meq via INTRAVENOUS
  Filled 2021-01-02 (×4): qty 100

## 2021-01-02 MED ORDER — ENSURE ENLIVE PO LIQD
237.0000 mL | Freq: Three times a day (TID) | ORAL | Status: DC
  Administered 2021-01-02 – 2021-01-26 (×58): 237 mL via ORAL

## 2021-01-02 NOTE — Progress Notes (Signed)
OT Cancellation Note  Patient Details Name: [name] MRN: [id number] DOB: 02/11/1976   Cancelled Treatment:    Reason Eval/Treat Not Completed: Patient declined, no reason specified. OT and PT attempting co-tx but pt declined and reports having a bad night. OT to re-attempt when next available.   Jackquline Denmark, MS, OTR/L , CBIS ascom [phone]  01/02/21, 2:16 PM

## 2021-01-02 NOTE — Progress Notes (Signed)
PROGRESS NOTE    [name]  ION:[id number] DOB: Jan 23, 1976 DOA: 12/17/2020 PCP: Eunice Blase, PA-C   Brief Narrative:  [name] is a 45 y.o. female with medical history significant for Bipolar disorder, diabetes melitis, alcohol use disorder and polysubstance abuse, FSH muscular dystrophy, with history of falls, last seen by neurology in January 2020, who was brought in by EMS was brought into the hospital after sustaining a fall and was unable to get up.  In the ED patient was noted to have severe hyponatremia with sodium level of 113, potassium of 2.9.  Ammonia was elevated at 52 with bilirubin of 11.9.  Patient did have a right lower lobe infiltrate and received antibiotic with Unasyn and IV fluids.  She was also noted to have hepatic encephalopathy and underwent MRI of the abdomen and liver without any obstruction.  Hepatomegaly and steatosis.  Dose of Lyrica was decreased during hospitalization.  Assessment & Plan:   Principal Problem:   Sepsis ([name]) Active Problems:   Alcohol withdrawal ([name])   Bipolar 2 disorder ([name])   Hypokalemia   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[name])   Acute hyponatremia   Alcohol use disorder, moderate, dependence ([name])   Hepatic steatosis   Polysubstance abuse ([name])   Hyperglycemia due to type 2 diabetes mellitus ([name])   Hyperbilirubinemia   Lactic acidosis   Alcoholic liver disease ([name])   Acute hepatic encephalopathy   Pressure injury of skin  Sepsis likely secondary to aspiration pneumonia.  Was on IV vancomycin.  This was converted to linezolid.   Acute hepatic encephalopathy.   Lyrica dose was decreased from 150 to 75 mg.  Continue lactulose and rifaximin.    Ascites and peripheral edema due to cirrhosis of liver.  MRI of the abdomen did not show any portal hypertension.  Continue IV Lasix    Severe pulmonary hypertension  2D echocardiogram showed LVEF 60-65%, RVSP 60 , severe pulmonary hypertension.  CTA of the chest did  not show any pulmonary embolism.   Acute hypoxic respiratory failure  Secondary to aspiration pneumonia right pleural effusion and right lower lobe atelectasis.  Patient underwent right-sided thoracocentesis on 05/29/2020 with improvement.       Hyperbilirubinemia secondary to cirrhosis of liver Hepatitis panel nonreactive.  GI on board.  Normal Doppler of the liver.  MRI of the abdomen with steatosis.  No biliary obstruction.  Could be secondary to ischemic hepatitis.  Latest bilirubin of 9.2.    Hepatic steatosis hepatomegaly.   Likely secondary to alcohol use disorder.  MRI showed cirrhosis of liver without portal hypertension.  Patient will need to quit drinking.   Alcohol use disorder moderate with polysubstance abuse with alcohol withdrawal.   Patient was put on CIWA protocol.  Counseled on quitting alcohol.  Waiting for skilled nursing facility placement at this time.   Acute hyponatremia.  Resolved.     Hyperkalemia followed by hypokalemia. Will replace as necessary.  Hypomagnesemia Replenished.   Hypophosphatemia  Secondary to poor nutrition.   Macrocytic anemia secondary to liver cirrhosis, anemia work-up within normal range.  Euthyroid sick syndrome.   Follow-up as outpatient.  Poor peripheral IV access.  Status post PICC line  DVT prophylaxis: Lovenox SQ   Code Status: full code   Family Communication: None   Disposition Plan:     Awaiting for skilled nursing facility placement.  consults called: GI  Admission status: Inpatient  Procedures:  PICC line placement. Ultrasound-guided thoracocentesis 12/30/2020  Subjective: Today, patient was seen  and examined at bedside.  Patient states that she feels little short winded but no pain, nausea, vomiting.  Has had bowel movements yesterday.  Denies abdominal pain today.  Objective: Vitals:   01/01/21 2335 01/02/21 0417 01/02/21 0419 01/02/21 0752  BP: 119/72 113/72  116/74  Pulse: 87 84  (!) 103  Resp: 16  16  17   Temp: 99.4 F (37.4 C) 98.1 F (36.7 C)  98.7 F (37.1 C)  TempSrc:  Oral    SpO2: 95% 97%  95%  Weight:   105.8 kg   Height:        Intake/Output Summary (Last 24 hours) at 01/02/2021 1301 Last data filed at 01/02/2021 1118 Gross per 24 hour  Intake 728.02 ml  Output 1700 ml  Net -971.98 ml    Filed Weights   12/31/20 0513 01/01/21 0543 01/02/21 0419  Weight: 109.2 kg 106.5 kg 105.8 kg   Body mass index is 36.53 kg/m.   Physical examination: General: Obese, not in obvious distress, on nasal cannula oxygen HENT:   Pallor noted.  Icterus noted, oral mucosa is moist.  Chest:    Diminished breath sounds bilaterally. No crackles or wheezes.  CVS: S1 &S2 heard. No murmur.  Regular rate and rhythm. Abdomen: Soft, nontender, nondistended.  Bowel sounds are heard.   Extremities: No cyanosis, clubbing bilateral lower extremity pitting edema.  Peripheral pulses are palpable.  Right PICC line in place Psych: Alert, awake and oriented, normal mood CNS:  No cranial nerve deficits.  Power equal in all extremities.   Skin: Warm and dry.  No rashes noted.  Data Reviewed: I have personally reviewed the following labs and imaging studies   CBC: Recent Labs  Lab 12/29/20 0514 12/30/20 0839 12/31/20 0540 01/01/21 0556 01/02/21 0535  WBC 17.1* 18.9* 18.2* 21.2* 24.4*  HGB 8.2* 8.6* 8.0* 8.6* 8.6*  HCT 27.8* 26.8* 26.7* 27.1* 28.0*  MCV 120.3* 113.6* 116.6* 114.8* 112.9*  PLT 127* PLATELET CLUMPS NOTED ON SMEAR, UNABLE TO ESTIMATE 120* 130* 153    Basic Metabolic Panel: Recent Labs  Lab 12/28/20 0516 12/29/20 0514 12/30/20 0839 12/30/20 1434 12/31/20 0540 01/01/21 0556 01/02/21 0535  NA 139 139 140  --  142 141  140 139  K 3.9 3.7 4.5  --  3.0* 3.1*  3.0* 3.0*  CL 105 105 101  --  100 96*  96* 94*  CO2 28 30 27   --  36* 34*  37* 37*  GLUCOSE 253* 152* 200*  --  138* 173*  174* 195*  BUN 23* 23* 23*  --  20 16  17 16   CREATININE 0.71 0.66 0.34*  --  0.52  0.53  0.53 0.50  CALCIUM 8.1* 8.3* 8.5*  --  8.3* 8.4*  8.2* 7.9*  MG 1.7 1.6*  --  1.4* 1.9  --   --   PHOS 3.2 2.8  --  2.2* 3.6  --   --     GFR: Estimated Creatinine Clearance: 112.3 mL/min (by C-G formula based on SCr of 0.5 mg/dL). Liver Function Tests: Recent Labs  Lab 12/29/20 0514 12/30/20 1434 12/31/20 0540 01/01/21 0556 01/02/21 0535  AST 176* 195* 189* 183* 178*  ALT 32 37 38 35 34  ALKPHOS 161* 177* 165* 172* 183*  BILITOT 10.4* 10.0* 9.2* 8.8* 9.2*  PROT 6.4* 6.4* 6.4* 6.3* 6.1*  ALBUMIN 1.5* <1.5* <1.5* <1.5* <1.5*    No results for input(s): LIPASE, AMYLASE in the last 168 hours. Recent Labs  Lab 12/29/20 0521 12/30/20 1434 12/31/20 0540 01/01/21 0556 01/02/21 0535  AMMONIA 41* 40* 66* 57* 46*    Coagulation Profile: No results for input(s): INR, PROTIME in the last 168 hours. Cardiac Enzymes: No results for input(s): CKTOTAL, CKMB, CKMBINDEX, TROPONINI in the last 168 hours. BNP (last 3 results) No results for input(s): PROBNP in the last 8760 hours. HbA1C: No results for input(s): HGBA1C in the last 72 hours. CBG: Recent Labs  Lab 01/01/21 1105 01/01/21 1649 01/01/21 2225 01/02/21 0821 01/02/21 1231  GLUCAP 163* 182* 201* 178* 211*    Lipid Profile: No results for input(s): CHOL, HDL, LDLCALC, TRIG, CHOLHDL, LDLDIRECT in the last 72 hours. Thyroid Function Tests: No results for input(s): TSH, T4TOTAL, FREET4, T3FREE, THYROIDAB in the last 72 hours. Anemia Panel: No results for input(s): VITAMINB12, FOLATE, FERRITIN, TIBC, IRON, RETICCTPCT in the last 72 hours. Sepsis Labs: Recent Labs  Lab 12/28/20 0516  PROCALCITON 1.57     Recent Results (from the past 240 hour(s))  Body fluid culture w Gram Stain     Status: None (Preliminary result)   Collection Time: 12/30/20  9:45 AM   Specimen: PATH Cytology Peritoneal fluid  Result Value Ref Range Status   Specimen Description   Final    PERITONEAL Performed at [facility], [address] [place], [place] [address]    Special Requests   Final    NONE Performed at [facility], [address] [place], [place] [address]    Gram Stain   Final    WBC PRESENT,BOTH PMN AND MONONUCLEAR NO ORGANISMS SEEN CYTOSPIN SMEAR    Culture   Final    NO GROWTH 3 DAYS Performed at [facility], [address] [place], [place] [address]    Report Status PENDING  Incomplete     Radiology Studies: DG Chest Port 1 View  Result Date: 01/02/2021 CLINICAL DATA:  Aspiration pneumonia EXAM: PORTABLE CHEST 1 VIEW COMPARISON:  12/28/2020 FINDINGS: Stable complete obscuration of the right heart border due to a combination of elevation of the right hemidiaphragm, atelectasis, possibly underlying pleural effusion as shown on the CT scan from 4 days ago. Mildly improved aeration at the left lung base although some hazy density in the left hemithorax persists and there is some linear bandlike opacities in the left perihilar region. Some of the relative density of the left hemithorax compared to the right is also due to distribution of breast and chest wall tissues during imaging. Right internal jugular central venous catheter tip: SVC. No pneumothorax identified. IMPRESSION: 1. Mildly improved aeration at the left lung base compared to 01/28/2021. Otherwise stable appearance with elevated right hemidiaphragm, atelectasis, and likely underlying right pleural effusion obscuring the right heart border. 2. Right IJ central line tip: SVC. Electronically Signed   By: Gaylyn Rong M.D.   On: 01/02/2021 10:09     Scheduled Meds:  budesonide  9 mg Oral Daily   enoxaparin (LOVENOX) injection  0.5 mg/kg Subcutaneous Q24H   feeding supplement  237 mL Oral BID BM   FLUoxetine  80 mg Oral Daily   fluticasone  1 spray Each Nare Daily   folic acid  1 mg Oral Daily   furosemide  40 mg Intravenous BID   insulin aspart  0-20 Units Subcutaneous TID WC   insulin aspart  0-5  Units Subcutaneous QHS   insulin aspart  3 Units Subcutaneous TID WC   insulin glargine-yfgn  14 Units Subcutaneous QHS  lactulose  20 g Oral TID   multivitamin with minerals  1 tablet Oral Daily   OLANZapine  5 mg Oral Daily   pantoprazole  40 mg Oral BID   pregabalin  75 mg Oral TID   rifaximin  550 mg Oral BID   thiamine  100 mg Oral Daily   Vitamin D (Ergocalciferol)  50,000 Units Oral Q7 days   Continuous Infusions:  sodium chloride Stopped (12/31/20 0118)   potassium chloride 10 mEq (01/02/21 1230)     LOS: 15 days    Joycelyn Das, MD [facility] If 7PM-7AM, please contact night-coverage 01/02/2021, 1:01 PM

## 2021-01-02 NOTE — Progress Notes (Signed)
Nutrition Follow Up Note   DOCUMENTATION CODES:   Morbid obesity  INTERVENTION:   Increase Ensure Enlive po to TID, each supplement provides 350 kcal and 20 grams of protein  MVI po daily   NUTRITION DIAGNOSIS:   Inadequate oral intake related to acute illness as evidenced by per patient/family report.  GOAL:   Patient will meet greater than or equal to 90% of their needs -progressing   MONITOR:   PO intake, Supplement acceptance, Labs, Weight trends, Skin, I & O's  ASSESSMENT:   45 y.o. 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 Pt found to have cirrhosis and pulmonary hypertension.   Pt s/p MRI 10/7 which showed marked hepatomegaly and marked hepatic steatosis.  Pt with intermittent good appetite and oral intake. Pt eating anywhere from sips/bites to 100% of meals in hospital. Pt is drinking 2 Ensure per day in hospital. Pt ate 80% of her breakfast this morning which included eggs, breakfast potatoes and a yogurt. Pt only ate bites of her lunch. Pt drank 2 Ensure today. Per chart, pt appears weight stable since admit. RD will increase Ensure to three times daily. Plan is for SNF at discharge.   Medications reviewed and include: lovenox, folic acid, lasix, insulin, lactulose, MVI, protonix, thiamine, vitamin D  Labs reviewed: K 3.0(L), Alk Phos 183(H), albumin <1.5(L), ammonia 46(H) P 3.6 wnl, Mg 1.9 wnl- 10/15 Wbc- 24.4(H), Hgb 8.6(L), Hct 28.0(L), MCV 112.9(H), MCH 34.7(H) Cbgs- 178, 211 x 24 hrs  Diet Order:   Diet Order             Diet Carb Modified Fluid consistency: Thin; Room service appropriate? Yes  Diet effective now                  EDUCATION NEEDS:   Education needs have been addressed  Skin:  Skin  Assessment: Reviewed RN Assessment  Last BM:  10/17- type 6  Height:   Ht Readings from Last 1 Encounters:  12/17/20 $RemoveB'5\' 7"'xKrPxyDv$  (1.702 m)    Weight:   Wt Readings from Last 1 Encounters:  01/02/21 105.8 kg    Ideal Body Weight:  61.36 kg  BMI:  Body mass index is 36.53 kg/m.  Estimated Nutritional Needs:   Kcal:  2100-2400kcal/day  Protein:  105-120g/day  Fluid:  1.9-2.2L/day  Koleen Distance MS, RD, LDN Please refer to [facility] for RD and/or RD on-call/weekend/after hours pager

## 2021-01-02 NOTE — Progress Notes (Signed)
PT Cancellation Note  Patient Details Name: [name] MRN: [id number] DOB: [birth year]   Cancelled Treatment:    Reason Eval/Treat Not Completed: Fatigue/lethargy limiting ability to participate;Patient declined, no reason specified. Chart reviewed. Upon entry, pt reports she had a "bad night" last night. PT/OT reminded pt of OOB order - pt reports she has not been out of bed since last week. Therapy also provided suggestions on how to make the [facility] sling more comfortable during transfers with nursing in an attempt to convince pt - pt not agreeable. PT will re-attempt at later date.   Basilia Jumbo PT, DPT 01/02/21 3:10 PM [phone]

## 2021-01-02 NOTE — Progress Notes (Signed)
Notified MD of pt complaining of lower abd pain. Pt stating that it felt like a cramping pain. Order placed. Continue to monitor.

## 2021-01-03 DIAGNOSIS — A419 Sepsis, unspecified organism: Secondary | ICD-10-CM | POA: Diagnosis not present

## 2021-01-03 DIAGNOSIS — E871 Hypo-osmolality and hyponatremia: Secondary | ICD-10-CM | POA: Diagnosis not present

## 2021-01-03 DIAGNOSIS — K7682 Hepatic encephalopathy: Secondary | ICD-10-CM | POA: Diagnosis not present

## 2021-01-03 DIAGNOSIS — F10930 Alcohol use, unspecified with withdrawal, uncomplicated: Secondary | ICD-10-CM | POA: Diagnosis not present

## 2021-01-03 LAB — COMPREHENSIVE METABOLIC PANEL
ALT: 34 U/L (ref 0–44)
AST: 174 U/L — ABNORMAL HIGH (ref 15–41)
Albumin: <1.5 g/dL — ABNORMAL LOW (ref 3.5–5.0)
Alkaline Phosphatase: 205 U/L — ABNORMAL HIGH (ref 38–126)
Anion gap: 11 (ref 5–15)
BUN: 18 mg/dL (ref 6–20)
CO2: 38 mmol/L — ABNORMAL HIGH (ref 22–32)
Calcium: 7.6 mg/dL — ABNORMAL LOW (ref 8.9–10.3)
Chloride: 89 mmol/L — ABNORMAL LOW (ref 98–111)
Creatinine, Ser: 0.55 mg/dL (ref 0.44–1.00)
GFR, Estimated: >60 mL/min (ref >60)
Glucose, Bld: 240 mg/dL — ABNORMAL HIGH (ref 70–99)
Potassium: 3.2 mmol/L — ABNORMAL LOW (ref 3.5–5.1)
Sodium: 138 mmol/L (ref 135–145)
Total Bilirubin: 9.1 mg/dL — ABNORMAL HIGH (ref 0.3–1.2)
Total Protein: 6.1 g/dL — ABNORMAL LOW (ref 6.5–8.1)

## 2021-01-03 LAB — PROTIME-INR
INR: 1.1 (ref 0.8–1.2)
Prothrombin Time: 14.6 seconds (ref 11.4–15.2)

## 2021-01-03 LAB — CBC
HCT: 26.4 % — ABNORMAL LOW (ref 36.0–46.0)
Hemoglobin: 8.4 g/dL — ABNORMAL LOW (ref 12.0–15.0)
MCH: 36.1 pg — ABNORMAL HIGH (ref 26.0–34.0)
MCHC: 31.8 g/dL (ref 30.0–36.0)
MCV: 113.3 fL — ABNORMAL HIGH (ref 80.0–100.0)
Platelets: 203 10*3/uL (ref 150–400)
RBC: 2.33 MIL/uL — ABNORMAL LOW (ref 3.87–5.11)
RDW: 22.3 % — ABNORMAL HIGH (ref 11.5–15.5)
WBC: 23.4 10*3/uL — ABNORMAL HIGH (ref 4.0–10.5)
nRBC: 0.5 % — ABNORMAL HIGH (ref 0.0–0.2)

## 2021-01-03 LAB — GLUCOSE, CAPILLARY
Glucose-Capillary: 232 mg/dL — ABNORMAL HIGH (ref 70–99)
Glucose-Capillary: 198 mg/dL — ABNORMAL HIGH (ref 70–99)
Glucose-Capillary: 286 mg/dL — ABNORMAL HIGH (ref 70–99)
Glucose-Capillary: 206 mg/dL — ABNORMAL HIGH (ref 70–99)

## 2021-01-03 LAB — PHOSPHORUS: Phosphorus: 2.7 mg/dL (ref 2.5–4.6)

## 2021-01-03 LAB — MAGNESIUM: Magnesium: 1.2 mg/dL — ABNORMAL LOW (ref 1.7–2.4)

## 2021-01-03 MED ORDER — MAGNESIUM SULFATE 4 GM/100ML IV SOLN
4.0000 g | Freq: Once | INTRAVENOUS | Status: AC
  Administered 2021-01-03: 4 g via INTRAVENOUS
  Filled 2021-01-03: qty 100

## 2021-01-03 MED ORDER — SPIRONOLACTONE 25 MG PO TABS
25.0000 mg | ORAL_TABLET | Freq: Every day | ORAL | Status: DC
  Administered 2021-01-03 – 2021-01-05 (×3): 25 mg via ORAL
  Filled 2021-01-03 (×3): qty 1

## 2021-01-03 MED ORDER — POTASSIUM CHLORIDE CRYS ER 20 MEQ PO TBCR
40.0000 meq | EXTENDED_RELEASE_TABLET | Freq: Once | ORAL | Status: AC
  Administered 2021-01-03: 40 meq via ORAL
  Filled 2021-01-03: qty 2

## 2021-01-03 NOTE — Progress Notes (Signed)
PROGRESS NOTE    [name]  KVQ:[id number] DOB: 01/11/1976 DOA: 12/17/2020 PCP: Eunice Blase, PA-C   Brief Narrative:   [name] is a 45 y.o. female with medical history significant for Bipolar disorder, diabetes mellitus alcohol use disorder and polysubstance abuse, FSH muscular dystrophy, with history of falls, last seen by neurology in January 2020, who was brought in by EMS was brought into the hospital after sustaining a fall and was unable to get up.  In the ED, patient was noted to have severe hyponatremia with sodium level of 113, potassium of 2.9.  Ammonia was elevated at 52 with bilirubin of 11.9.  Patient did have a right lower lobe infiltrate and received antibiotic with Unasyn and IV fluids.  She was also noted to have hepatic encephalopathy and was admitted to hospital.    Assessment & Plan:   Principal Problem:   Sepsis ([name]) Active Problems:   Alcohol withdrawal ([name])   Bipolar 2 disorder ([name])   Hypokalemia   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[name])   Acute hyponatremia   Alcohol use disorder, moderate, dependence ([name])   Hepatic steatosis   Polysubstance abuse ([name])   Hyperglycemia due to type 2 diabetes mellitus ([name])   Hyperbilirubinemia   Lactic acidosis   Alcoholic liver disease ([name])   Acute hepatic encephalopathy   Pressure injury of skin  Sepsis likely secondary to aspiration pneumonia.  Was initially on IV vancomycin.  This was converted to linezolid and has completed the course..   Acute hepatic encephalopathy.   Lyrica dose was decreased from 150 to 75 mg to decrease sedation..  Currently on lactulose and rifaximin.  Ammonia level has decreased as well.  Patient is alert awake oriented.  Ascites and peripheral edema due to cirrhosis of liver, pulmonary hypertension.  MRI of the abdomen did not show any portal hypertension.  Continue IV Lasix for volume management.  We will add the patient on spironolactone from today.  Can titrate  over the next few days   Severe pulmonary hypertension  2D echocardiogram showed LVEF 60-65%, RVSP 60 , severe pulmonary hypertension.  CTA of the chest did not show any pulmonary embolism.   Acute hypoxic respiratory failure  Secondary to aspiration pneumonia, right pleural effusion and right lower lobe atelectasis.  Patient underwent right-sided thoracocentesis on 05/29/2020 with improvement.  She had significantly low albumin which will predispose her to have a effusion.     Hyperbilirubinemia secondary to cirrhosis of liver with hepatic steatosis and hepatomegaly. Secondary to alcohol use.  Hepatitis panel nonreactive.  GI hospitalization.  Normal Doppler of the liver.  MRI of the abdomen with steatosis but no biliary obstruction or portal hypertension.  Patient was encouraged and counseled on quitting alcohol completely.  Continue to monitor LFTs.   Alcohol use disorder moderate with polysubstance abuse with alcohol withdrawal.   Was initially on CIWA protocol.  No concern for withdrawal at this time.   Acute hyponatremia.  Resolved.  On IV Lasix.  Has been started on spironolactone.   Hyperkalemia followed by hypokalemia. Will replace as necessary.  Will initiate spironolactone from today.  Closely monitor potassium levels.  Patient will receive 4 g of magnesium sulfate today.  Received 40 mEq of p.o. potassium.  Hypomagnesemia Magnesium level of 1.2 today.  We will replenish with IV magnesium sulfate.   Hypophosphatemia  Secondary to poor nutrition.  Improved with replacement.  Diabetes mellitus type 2.  Continue on long-acting sliding scale and mealtime insulin.  Macrocytic anemia secondary to liver cirrhosis, anemia work-up within normal range.  Euthyroid sick syndrome.   Follow-up as outpatient.  Poor peripheral IV access.  Status post PICC line  Debility, deconditioning, history of muscular dystrophy.  Continue PT.  Physical therapy has recommended skilled nursing  facility placement at this time.  DVT prophylaxis: Lovenox SQ   Code Status: full code   Family Communication: None   Disposition Plan:     Awaiting for skilled nursing facility placement.  consults: GI  Admission status: Inpatient  Procedures:  PICC line placement. Ultrasound-guided thoracocentesis 12/30/2020  Subjective: Today, patient was seen and examined at bedside.  Complains of intermittent abdominal pain but no current pain.  Denies overt  chest pain dizziness lightheadedness .  Mild shortness of breath.  Objective: Vitals:   01/02/21 1926 01/03/21 0248 01/03/21 0441 01/03/21 0751  BP: 102/61 113/73  114/68  Pulse: 97 (!) 104  99  Resp: 20 16  16   Temp: 98.3 F (36.8 C) 97.8 F (36.6 C)  98.3 F (36.8 C)  TempSrc: Oral Oral  Oral  SpO2: 93% 96%  93%  Weight:   101.4 kg   Height:        Intake/Output Summary (Last 24 hours) at 01/03/2021 1651 Last data filed at 01/03/2021 0900 Gross per 24 hour  Intake --  Output 2150 ml  Net -2150 ml    Filed Weights   01/01/21 0543 01/02/21 0419 01/03/21 0441  Weight: 106.5 kg 105.8 kg 101.4 kg   Body mass index is 35.01 kg/m.   Physical examination: General: Obese, not in obvious distress, on nasal cannula oxygen HENT:   Pallor noted.  Deep icterus noted, oral mucosa is moist.  Chest:    Diminished breath sounds bilaterally.  CVS: S1 &S2 heard. No murmur.  Regular rate and rhythm. Abdomen: Soft, nontender, nondistended.  Bowel sounds are heard.   Extremities: No cyanosis, clubbing bilateral lower extremity pitting edema.  Peripheral pulses are palpable.  Right PICC line in place Psych: Alert, awake and oriented, normal mood CNS:  No cranial nerve deficits.  Moving all extremities.  Generalized weakness noted. Skin: Warm and dry.  No rashes noted.  Data Reviewed: I have personally reviewed the following labs and imaging studies   CBC: Recent Labs  Lab 12/30/20 0839 12/31/20 0540 01/01/21 0556  01/02/21 0535 01/03/21 0750  WBC 18.9* 18.2* 21.2* 24.4* 23.4*  HGB 8.6* 8.0* 8.6* 8.6* 8.4*  HCT 26.8* 26.7* 27.1* 28.0* 26.4*  MCV 113.6* 116.6* 114.8* 112.9* 113.3*  PLT PLATELET CLUMPS NOTED ON SMEAR, UNABLE TO ESTIMATE 120* 130* 153 203    Basic Metabolic Panel: Recent Labs  Lab 12/28/20 0516 12/29/20 0514 12/30/20 0839 12/30/20 1434 12/31/20 0540 01/01/21 0556 01/02/21 0535 01/03/21 0750  NA 139 139 140  --  142 141  140 139 138  K 3.9 3.7 4.5  --  3.0* 3.1*  3.0* 3.0* 3.2*  CL 105 105 101  --  100 96*  96* 94* 89*  CO2 28 30 27   --  36* 34*  37* 37* 38*  GLUCOSE 253* 152* 200*  --  138* 173*  174* 195* 240*  BUN 23* 23* 23*  --  20 16  17 16 18   CREATININE 0.71 0.66 0.34*  --  0.52 0.53  0.53 0.50 0.55  CALCIUM 8.1* 8.3* 8.5*  --  8.3* 8.4*  8.2* 7.9* 7.6*  MG 1.7 1.6*  --  1.4* 1.9  --   --  1.2*  PHOS 3.2 2.8  --  2.2* 3.6  --   --  2.7    GFR: Estimated Creatinine Clearance: 109.8 mL/min (by C-G formula based on SCr of 0.55 mg/dL). Liver Function Tests: Recent Labs  Lab 12/30/20 1434 12/31/20 0540 01/01/21 0556 01/02/21 0535 01/03/21 0750  AST 195* 189* 183* 178* 174*  ALT 37 38 35 34 34  ALKPHOS 177* 165* 172* 183* 205*  BILITOT 10.0* 9.2* 8.8* 9.2* 9.1*  PROT 6.4* 6.4* 6.3* 6.1* 6.1*  ALBUMIN <1.5* <1.5* <1.5* <1.5* <1.5*    No results for input(s): LIPASE, AMYLASE in the last 168 hours. Recent Labs  Lab 12/29/20 0521 12/30/20 1434 12/31/20 0540 01/01/21 0556 01/02/21 0535  AMMONIA 41* 40* 66* 57* 46*    Coagulation Profile: Recent Labs  Lab 01/03/21 0750  INR 1.1   Cardiac Enzymes: No results for input(s): CKTOTAL, CKMB, CKMBINDEX, TROPONINI in the last 168 hours. BNP (last 3 results) No results for input(s): PROBNP in the last 8760 hours. HbA1C: No results for input(s): HGBA1C in the last 72 hours. CBG: Recent Labs  Lab 01/02/21 1710 01/02/21 2042 01/03/21 0751 01/03/21 1119 01/03/21 1627  GLUCAP 282* 206* 232*  198* 286*    Lipid Profile: No results for input(s): CHOL, HDL, LDLCALC, TRIG, CHOLHDL, LDLDIRECT in the last 72 hours. Thyroid Function Tests: No results for input(s): TSH, T4TOTAL, FREET4, T3FREE, THYROIDAB in the last 72 hours. Anemia Panel: No results for input(s): VITAMINB12, FOLATE, FERRITIN, TIBC, IRON, RETICCTPCT in the last 72 hours. Sepsis Labs: Recent Labs  Lab 12/28/20 0516  PROCALCITON 1.57     Recent Results (from the past 240 hour(s))  Body fluid culture w Gram Stain     Status: None   Collection Time: 12/30/20  9:45 AM   Specimen: PATH Cytology Peritoneal fluid  Result Value Ref Range Status   Specimen Description   Final    PERITONEAL Performed at [facility], [address], [place], [place] [address]    Special Requests   Final    NONE Performed at [facility], [address] [place], [place] [address]    Gram Stain   Final    WBC PRESENT,BOTH PMN AND MONONUCLEAR NO ORGANISMS SEEN CYTOSPIN SMEAR    Culture   Final    NO GROWTH 3 DAYS Performed at [facility], [address], [place], [place] [address]    Report Status 01/02/2021 FINAL  Final     Radiology Studies: DG Chest Port 1 View  Result Date: 01/02/2021 CLINICAL DATA:  Aspiration pneumonia EXAM: PORTABLE CHEST 1 VIEW COMPARISON:  12/28/2020 FINDINGS: Stable complete obscuration of the right heart border due to a combination of elevation of the right hemidiaphragm, atelectasis, possibly underlying pleural effusion as shown on the CT scan from 4 days ago. Mildly improved aeration at the left lung base although some hazy density in the left hemithorax persists and there is some linear bandlike opacities in the left perihilar region. Some of the relative density of the left hemithorax compared to the right is also due to distribution of breast and chest wall tissues during imaging. Right internal jugular central venous catheter tip: SVC. No pneumothorax identified.  IMPRESSION: 1. Mildly improved aeration at the left lung base compared to 01/28/2021. Otherwise stable appearance with elevated right hemidiaphragm, atelectasis, and likely underlying right pleural effusion obscuring the right heart border. 2. Right IJ central line tip: SVC. Electronically Signed   By: Annitta Needs.D.  On: 01/02/2021 10:09     Scheduled Meds:  budesonide  9 mg Oral Daily   enoxaparin (LOVENOX) injection  0.5 mg/kg Subcutaneous Q24H   feeding supplement  237 mL Oral TID BM   FLUoxetine  80 mg Oral Daily   fluticasone  1 spray Each Nare Daily   folic acid  1 mg Oral Daily   furosemide  40 mg Intravenous BID   insulin aspart  0-20 Units Subcutaneous TID WC   insulin aspart  0-5 Units Subcutaneous QHS   insulin aspart  3 Units Subcutaneous TID WC   insulin glargine-yfgn  14 Units Subcutaneous QHS   lactulose  20 g Oral TID   multivitamin with minerals  1 tablet Oral Daily   OLANZapine  5 mg Oral Daily   pantoprazole  40 mg Oral BID   pregabalin  75 mg Oral TID   rifaximin  550 mg Oral BID   thiamine  100 mg Oral Daily   Vitamin D (Ergocalciferol)  50,000 Units Oral Q7 days   Continuous Infusions:  sodium chloride Stopped (12/31/20 0118)   magnesium sulfate bolus IVPB 4 g (01/03/21 1556)     LOS: 16 days    Joycelyn Das, MD [facility] If 7PM-7AM, please contact night-coverage 01/03/2021, 4:51 PM

## 2021-01-03 NOTE — Evaluation (Signed)
Occupational Therapy Re-Evaluation Patient Details Name: [name] MRN: [id number] DOB: May 10, 1975 Today's Date: 01/03/2021   History of Present Illness 45 y.o. female with medical history significant for Bipolar disorder, diabetes, alcohol use disorder and polysubstance abuse, FSH muscular dystrophy, with history of frequent falls, last seen by neurology in January 2020, who was brought in by EMS after she fell at home 2 days prior and was unable to get up.   Clinical Impression   Upon entering the room, pt supine in bed with cousin present in the room. Pt reports feeling much better today and agreeable to OT intervention. Pt able to move B LEs off EOB with increased time and cuing for technique. She is able to prop onto elbow and needing min A for trunk support to sit on EOB. Pt with increased sitting balance this session. Feet unsupported and pt utilizing BUEs to comb and detangle hair with close supervision for balance. Pt fatigues after sitting EOB for ~ 8 minutes. Pt returning to supine with min guard for safety. Pt educated on glute squeezes, toe presses, and knee flexion/extension while in bed. Pt returned demonstrations and reports she will perform on her own time. Pt making progress towards goals this session. Pt's goals updated. OT continues to recommend SNF at discharge to address functional deficits.      Recommendations for follow up therapy are one component of a multi-disciplinary discharge planning process, led by the attending physician.  Recommendations may be updated based on patient status, additional functional criteria and insurance authorization.   Follow Up Recommendations  SNF    Equipment Recommendations  3 in 1 bedside commode       Precautions / Restrictions Precautions Precautions: Fall Precaution Comments: watch HR      Mobility Bed Mobility Overal bed mobility: Needs Assistance Bed Mobility: Supine to Sit;Sit to Supine     Supine to sit: Min  assist;HOB elevated Sit to supine: Min guard;HOB elevated   General bed mobility comments: cuing for technique and hand placement. Pt able to move LEs in/out of bed with increased time.    Transfers                 General transfer comment: Pt declined    Balance Overall balance assessment: Needs assistance Sitting-balance support: Feet supported Sitting balance-Leahy Scale: Fair Sitting balance - Comments: close supervision                                   ADL either performed or assessed with clinical judgement   ADL Overall ADL's : Needs assistance/impaired     Grooming: Brushing hair;Set up;Supervision/safety Grooming Details (indicate cue type and reason): close supervision with dynamic sitting balance and both hands in hair and feet not touching the floor                                     Vision Patient Visual Report: No change from baseline              Pertinent Vitals/Pain Pain Assessment: Faces Faces Pain Scale: Hurts a little bit Pain Location: generalized Pain Descriptors / Indicators: Discomfort Pain Intervention(s): Limited activity within patient's tolerance;Monitored during session;Repositioned              Cognition Arousal/Alertness: Awake/alert Behavior During Therapy: WFL for tasks assessed/performed Overall Cognitive Status:  Within Functional Limits for tasks assessed                                 General Comments: pt is more alert and cooperative with OOB activities/therapy     OT Goals(Current goals can be found in the care plan section) Acute Rehab OT Goals Patient Stated Goal: to get stronger OT Goal Formulation: With patient Time For Goal Achievement: 01/17/21 Potential to Achieve Goals: Fair ADL Goals Pt Will Perform Grooming: sitting;with modified independence Pt Will Transfer to Toilet: with min assist;squat pivot transfer;bedside commode Pt Will Perform Toileting -  Clothing Manipulation and hygiene: with min assist;sit to/from stand  OT Frequency: Min 2X/week    AM-PAC OT "6 Clicks" Daily Activity     Outcome Measure Help from another person eating meals?: A Little Help from another person taking care of personal grooming?: A Little Help from another person toileting, which includes using toliet, bedpan, or urinal?: A Lot Help from another person bathing (including washing, rinsing, drying)?: A Lot Help from another person to put on and taking off regular upper body clothing?: A Little Help from another person to put on and taking off regular lower body clothing?: A Lot 6 Click Score: 15   End of Session Equipment Utilized During Treatment: Oxygen (1L) Nurse Communication: Mobility status  Activity Tolerance: Patient tolerated treatment well Patient left: in bed;with call bell/phone within reach;with bed alarm set;with family/visitor present  OT Visit Diagnosis: Unsteadiness on feet (R26.81);Muscle weakness (generalized) (M62.81);History of falling (Z91.81)                Time: 2353-6144 OT Time Calculation (min): 23 min Charges:  OT General Charges $OT Visit: 1 Visit OT Evaluation $OT Re-eval: 1 Re-eval OT Treatments $Self Care/Home Management : 8-22 mins $Therapeutic Activity: 8-22 mins  Jackquline Denmark, MS, OTR/L , CBIS ascom [phone]  01/03/21, 3:37 PM

## 2021-01-03 NOTE — Progress Notes (Signed)
Inpatient Diabetes Program Recommendations  AACE/ADA: New Consensus Statement on Inpatient Glycemic Control (2015)  Target Ranges:  Prepandial:   less than 140 mg/dL      Peak postprandial:   less than 180 mg/dL (1-2 hours)      Critically ill patients:  140 - 180 mg/dL    Results for [name], [name] (MRN [id number]) as of 01/03/2021 10:22  Ref. Range 01/02/2021 08:21 01/02/2021 12:31 01/02/2021 17:10 01/02/2021 20:42 01/03/2021 07:51  Glucose-Capillary Latest Ref Range: 70 - 99 mg/dL 754 (H) 492 (H) 010 (H) 206 (H) 232 (H)   Home DM Meds: Janumet XR 100/1000 mg Daily                             Metformin 500 mg BID                             Ozempic Qweek   Current Orders: Novolog 0-20 units TID AC + HS      Novolog 3 units tid meal coverage      Semglee 14 units QHS      Budesonide 9 mg Daily   Ensure Enlive tid between meals   Please consider:  -   Increase Semglee to 18 units QHS  -   Increase Novolog Meal Coverage: Novolog 5 units TID with meals Hold if pt eats <50% of meal, Hold if pt NPO   At discharge, will need Metformin discontinued due to history of liver disease (note that there is metformin in Janumet).   If stopping Janumet, could prescribe Januvia by itself.  --Will follow patient during hospitalization-- Christena Deem RN, MSN, BC-ADM Inpatient Diabetes Coordinator Team Pager [phone] (8a-5p)

## 2021-01-03 NOTE — TOC Progression Note (Addendum)
Transition of Care [facility]) - Progression Note    Patient Details  Name: [name] MRN: [id number] Date of Birth: October 19, 1975  Transition [facility] CM/SW Contact  Margarito Liner, LCSW Phone Number: 01/03/2021, 8:22 AM  Clinical Narrative: No bed offers this morning. Sent therapy notes to pending SNF's to try and trigger responses. Will expand search as well.    3:18 pm: Still no bed offers.  Expected Discharge Plan: Skilled Nursing Facility Barriers to Discharge: Continued Medical Work up  Expected Discharge Plan and Services Expected Discharge Plan: Skilled Nursing Facility     Post Acute Care Choice: Skilled Nursing Facility Living arrangements for the past 2 months: Single Family Home                                       Social Determinants of Health (SDOH) Interventions    Readmission Risk Interventions No flowsheet data found.

## 2021-01-04 DIAGNOSIS — A419 Sepsis, unspecified organism: Secondary | ICD-10-CM | POA: Diagnosis not present

## 2021-01-04 DIAGNOSIS — K7682 Hepatic encephalopathy: Secondary | ICD-10-CM | POA: Diagnosis not present

## 2021-01-04 DIAGNOSIS — J690 Pneumonitis due to inhalation of food and vomit: Secondary | ICD-10-CM

## 2021-01-04 DIAGNOSIS — R6520 Severe sepsis without septic shock: Secondary | ICD-10-CM | POA: Diagnosis not present

## 2021-01-04 DIAGNOSIS — E871 Hypo-osmolality and hyponatremia: Secondary | ICD-10-CM | POA: Diagnosis not present

## 2021-01-04 LAB — CBC
HCT: 27 % — ABNORMAL LOW (ref 36.0–46.0)
Hemoglobin: 8.5 g/dL — ABNORMAL LOW (ref 12.0–15.0)
MCH: 35.9 pg — ABNORMAL HIGH (ref 26.0–34.0)
MCHC: 31.5 g/dL (ref 30.0–36.0)
MCV: 113.9 fL — ABNORMAL HIGH (ref 80.0–100.0)
Platelets: 223 10*3/uL (ref 150–400)
RBC: 2.37 MIL/uL — ABNORMAL LOW (ref 3.87–5.11)
RDW: 21.8 % — ABNORMAL HIGH (ref 11.5–15.5)
WBC: 27.3 10*3/uL — ABNORMAL HIGH (ref 4.0–10.5)
nRBC: 0.3 % — ABNORMAL HIGH (ref 0.0–0.2)

## 2021-01-04 LAB — BASIC METABOLIC PANEL
Anion gap: 11 (ref 5–15)
BUN: 17 mg/dL (ref 6–20)
CO2: 37 mmol/L — ABNORMAL HIGH (ref 22–32)
Calcium: 7.5 mg/dL — ABNORMAL LOW (ref 8.9–10.3)
Chloride: 87 mmol/L — ABNORMAL LOW (ref 98–111)
Creatinine, Ser: 0.62 mg/dL (ref 0.44–1.00)
GFR, Estimated: >60 mL/min (ref >60)
Glucose, Bld: 179 mg/dL — ABNORMAL HIGH (ref 70–99)
Potassium: 3.3 mmol/L — ABNORMAL LOW (ref 3.5–5.1)
Sodium: 135 mmol/L (ref 135–145)

## 2021-01-04 LAB — IRON AND TIBC
Iron: 42 ug/dL (ref 28–170)
Saturation Ratios: 31 % (ref 10.4–31.8)
TIBC: 134 ug/dL — ABNORMAL LOW (ref 250–450)
UIBC: 92 ug/dL

## 2021-01-04 LAB — GLUCOSE, CAPILLARY
Glucose-Capillary: 180 mg/dL — ABNORMAL HIGH (ref 70–99)
Glucose-Capillary: 166 mg/dL — ABNORMAL HIGH (ref 70–99)
Glucose-Capillary: 291 mg/dL — ABNORMAL HIGH (ref 70–99)
Glucose-Capillary: 219 mg/dL — ABNORMAL HIGH (ref 70–99)

## 2021-01-04 LAB — MAGNESIUM: Magnesium: 1.6 mg/dL — ABNORMAL LOW (ref 1.7–2.4)

## 2021-01-04 LAB — PHOSPHORUS: Phosphorus: 2.9 mg/dL (ref 2.5–4.6)

## 2021-01-04 LAB — PROCALCITONIN: Procalcitonin: 1.77 ng/mL

## 2021-01-04 MED ORDER — POTASSIUM CHLORIDE 10 MEQ/100ML IV SOLN
10.0000 meq | INTRAVENOUS | Status: AC
  Administered 2021-01-04 (×4): 10 meq via INTRAVENOUS
  Filled 2021-01-04 (×4): qty 100

## 2021-01-04 MED ORDER — DICYCLOMINE HCL 20 MG PO TABS
20.0000 mg | ORAL_TABLET | Freq: Three times a day (TID) | ORAL | Status: DC | PRN
  Administered 2021-01-04 – 2021-01-24 (×20): 20 mg via ORAL
  Filled 2021-01-04 (×23): qty 1

## 2021-01-04 MED ORDER — MAGNESIUM SULFATE 2 GM/50ML IV SOLN
2.0000 g | Freq: Once | INTRAVENOUS | Status: AC
  Administered 2021-01-04: 2 g via INTRAVENOUS
  Filled 2021-01-04: qty 50

## 2021-01-04 MED ORDER — SODIUM CHLORIDE 0.9 % IV SOLN
3.0000 g | Freq: Four times a day (QID) | INTRAVENOUS | Status: DC
Start: 2021-01-04 — End: 2021-01-07
  Administered 2021-01-04 – 2021-01-07 (×11): 3 g via INTRAVENOUS
  Filled 2021-01-04 (×3): qty 8
  Filled 2021-01-04: qty 3
  Filled 2021-01-04 (×4): qty 8
  Filled 2021-01-04: qty 3
  Filled 2021-01-04 (×2): qty 8
  Filled 2021-01-04: qty 3
  Filled 2021-01-04 (×2): qty 8
  Filled 2021-01-04: qty 3

## 2021-01-04 NOTE — TOC CM/SW Note (Signed)
RE: [name] Date of Birth: 1975/05/31 Date: 01/04/21   To Whom It May Concern:  Please be advised that the above-named patient will require a short-term nursing home stay - anticipated 30 days or less for rehabilitation and strengthening.  The plan is for return home.

## 2021-01-04 NOTE — Consult Note (Signed)
Arlyss Repress, MD [address]  [address]  [place], [place] [address]  Main: [phone]  Fax: [phone] Pager: [phone]   Consultation  Referring Provider:     No ref. provider found Primary Care Physician:  Eunice Blase, PA-C Primary Gastroenterologist:  Dr. Tobi Bastos         Reason for Consultation:     Alcoholic liver disease  Date of Admission:  12/17/2020 Date of Consultation:  01/05/2021         HPI:   [name] is a 45 y.o. female with known history of bipolar disorder, diabetes, polysubstance drug abuse, alcohol abuse, FSH muscular dystrophy who is admitted on 12/17/2020, prolonged hospitalization secondary to severe hyponatremia, hepatic encephalopathy, pneumonia, alcoholic liver disease. Patient has been treated for acute hypoxemic respiratory failure secondary to aspiration pneumonia, persistent leukocytosis.  Patient was treated with linezolid and completed the course.  Her hepatic encephalopathy is being treated with lactulose and rifaximin.  Patient also had volume overload with ascites and swelling of legs, underwent paracentesis with 2.2 L of fluid removed on 12/30/2020.  There was no evidence of SBP.  Fluid analysis was consistent with portal hypertension.  She underwent MRI liver mass protocol which revealed marked hepatomegaly with marked hepatic steatosis and lobular hepatic contours.  No evidence of portal hypertension.  Patient also has severe pulmonary hypertension.  Patient became severely deconditioned by prolonged hospital stay and developed severe protein calorie malnutrition.  Patient has poorly controlled diabetes, hemoglobin A1c 11.  LFTs from today revealed alkaline phosphatase 190, AST 161, ALT 31, total bilirubin 9.9.  LFTs on admission were alkaline phosphatase 495, albumin 1.8, AST 283, ALT 39, hemoglobin 8.2, MCV 100.8, platelets 214  GI was originally consulted on 12/19/2020 for jaundice.  She underwent extensive work-up and her jaundice  was deemed to be secondary to underlying alcoholic liver disease.  She has worsening neutrophilic leukocytosis, restarted on antibiotics.  There is no evidence of recurrence of pneumonia based on chest x-ray  The patient, she was alert and oriented, but answering questions.  She reports that she has been feeling bloated and has abdominal discomfort.  She is receiving lactulose 20 g 3 times daily, has been having 4-5 episodes of diarrhea daily.  She is also receiving rifaximin.  Patient has been afebrile.  NSAIDs: None  Antiplts/Anticoagulants/Anti thrombotics: None  GI Procedures:  EGD 10/11/2010 Reveal erosive esophagitis  Past Medical History:  Diagnosis Date  . Alcohol abuse   . Anxiety   . Anxiety   . Anxiety and depression   . Asthma   . Bipolar disorder ([name])   . Borderline personality disorder ([name])   . Depression   . Diabetes ([name])    Type 2   . Dyslipidemia   . Esophageal varices ([name])   . Foot drop    right  . 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[name])   . GERD (gastroesophageal reflux disease)   . Gout   . History of drug abuse ([name])   . Insomnia   . Muscular dystrophies ([name])   . Neuropathy   . Pancreatitis   . 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 HERNIA REPAIR    . IR FLUORO GUIDE CV LINE RIGHT  12/30/2020  . IR US GUIDE VASC ACCESS RIGHT  12/30/2020    Prior to Admission medications   Medication Sig Start Date End Date Taking? Authorizing Provider  albuterol (VENTOLIN HFA) 108 (  90 Base) MCG/ACT inhaler Inhale 2 puffs into the lungs every 6 (six) hours as needed for wheezing. 09/15/18   Clapacs, Madie Reno, MD  atorvastatin (LIPITOR) 40 MG tablet Take 1 tablet (40 mg total) by mouth daily. 09/15/18   Clapacs, Madie Reno, MD  budesonide (ENTOCORT EC) 3 MG 24 hr capsule Take 9 mg by mouth daily. 12/09/20   [provider]  calcium-vitamin D (OSCAL WITH D) 500-200 MG-UNIT tablet Take 1 tablet by mouth  daily with breakfast. 09/16/18   Clapacs, Madie Reno, MD  cariprazine (VRAYLAR) capsule Take 1 capsule (3 mg total) by mouth daily. 09/15/18   Clapacs, Madie Reno, MD  clonazePAM (KLONOPIN) 1 MG tablet Take 1 mg by mouth 3 (three) times daily as needed for anxiety.    [provider]  dicyclomine (BENTYL) 20 MG tablet Take 20 mg by mouth 3 (three) times daily. 07/02/20   [provider]  diphenoxylate-atropine (LOMOTIL) 2.5-0.025 MG tablet Take 1 tablet by mouth in the morning, at noon, in the evening, and at bedtime. 07/07/20   [provider]  esomeprazole (NEXIUM) 40 MG capsule Take 40 mg by mouth daily. 09/30/20   [provider]  FLUoxetine (PROZAC) 40 MG capsule Take 2 capsules (80 mg total) by mouth daily. 09/15/18   Clapacs, Madie Reno, MD  fluticasone (FLONASE) 50 MCG/ACT nasal spray Place 1 spray into both nostrils daily. 09/16/18   Clapacs, Madie Reno, MD  JANUMET XR [phone] MG TB24 Take 1 tablet by mouth daily. 11/04/20   [provider]  metFORMIN (GLUCOPHAGE) 500 MG tablet Take 1 tablet (500 mg total) by mouth 2 (two) times daily with a meal. 09/15/18   Clapacs, Madie Reno, MD  methocarbamol (ROBAXIN) 500 MG tablet Take 500 mg by mouth 2 (two) times daily as needed. 12/01/20   [provider]  Multiple Vitamin (MULTIVITAMIN WITH MINERALS) TABS tablet Take 1 tablet by mouth daily. 09/16/18   Clapacs, Madie Reno, MD  MYRBETRIQ 50 MG TB24 tablet Take 50 mg by mouth daily. 11/14/20   [provider]  naloxone Karma Greaser) nasal spray 4 mg/0.1 mL Take as needed for opioid overdose 01/22/19   Duffy Bruce, MD  naproxen (NAPROSYN) 250 MG tablet Take 1 tablet (250 mg total) by mouth 3 times/day as needed-between meals & bedtime for mild pain. 09/15/18   Clapacs, Madie Reno, MD  Norgestimate-Ethinyl Estradiol Triphasic 0.18/0.215/0.25 MG-35 MCG tablet Take 1 tablet by mouth daily.    [provider]  OZEMPIC, 0.25 OR 0.5 MG/DOSE, 2 MG/1.5ML SOPN Inject into the  skin. 12/15/20   [provider]  pantoprazole (PROTONIX) 40 MG tablet Take 1 tablet (40 mg total) by mouth daily. 09/16/18   Clapacs, Madie Reno, MD  pregabalin (LYRICA) 75 MG capsule Take 75-150 mg by mouth 3 (three) times daily. 11/16/20   [provider]  promethazine (PHENERGAN) 25 MG tablet Take 25 mg by mouth every 6 (six) hours as needed for nausea/vomiting. 12/01/20   [provider]  topiramate (TOPAMAX) 200 MG tablet Take 1 tablet (200 mg total) by mouth at bedtime. 09/15/18   Clapacs, Madie Reno, MD   Current Facility-Administered Medications:  .  0.9 %  sodium chloride infusion, , Intravenous, PRN, Athena Masse, MD, Stopped at 12/31/20 0118 .  albuterol (PROVENTIL) (2.5 MG/3ML) 0.083% nebulizer solution 2.5 mg, 2.5 mg, Nebulization, Q6H PRN, Athena Masse, MD, 2.5 mg at 01/05/21 0605 .  albuterol (VENTOLIN HFA) 108 (90 Base) MCG/ACT inhaler 2 puff,  2 puff, Inhalation, Q6H PRN, Swayze, Ava, DO .  Ampicillin-Sulbactam (UNASYN) 3 g in sodium chloride 0.9 % 100 mL IVPB, 3 g, Intravenous, Q6H, Sharen Hones, MD, Last Rate: 200 mL/hr at 01/05/21 1012, 3 g at 01/05/21 1012 .  budesonide (ENTOCORT EC) 24 hr capsule 9 mg, 9 mg, Oral, Daily, Swayze, Ava, DO, 9 mg at 01/05/21 1023 .  dicyclomine (BENTYL) tablet 20 mg, 20 mg, Oral, TID PRN, Athena Masse, MD, 20 mg at 01/05/21 1124 .  enoxaparin (LOVENOX) injection 57.5 mg, 0.5 mg/kg, Subcutaneous, Q24H, Swayze, Ava, DO, 57.5 mg at 01/05/21 1019 .  feeding supplement (ENSURE ENLIVE / ENSURE PLUS) liquid 237 mL, 237 mL, Oral, TID BM, Pokhrel, Laxman, MD, 237 mL at 01/05/21 1026 .  FLUoxetine (PROZAC) capsule 80 mg, 80 mg, Oral, Daily, Swayze, Ava, DO, 80 mg at 01/05/21 1035 .  fluticasone (FLONASE) 50 MCG/ACT nasal spray 1 spray, 1 spray, Each Nare, Daily, Swayze, Ava, DO, 1 spray at 63/14/97 0263 .  folic acid (FOLVITE) tablet 1 mg, 1 mg, Oral, Daily, Manuella Ghazi, Vipul, MD, 1 mg at 01/05/21 1022 .  furosemide (LASIX) injection 40 mg,  40 mg, Intravenous, BID, Val Riles, MD, 40 mg at 01/05/21 1014 .  insulin aspart (novoLOG) injection 0-20 Units, 0-20 Units, Subcutaneous, TID WC, Athena Masse, MD, 4 Units at 01/05/21 1233 .  insulin aspart (novoLOG) injection 0-5 Units, 0-5 Units, Subcutaneous, QHS, Athena Masse, MD, 2 Units at 01/04/21 2145 .  insulin aspart (novoLOG) injection 3 Units, 3 Units, Subcutaneous, TID WC, Val Riles, MD, 3 Units at 01/05/21 1233 .  insulin glargine-yfgn (SEMGLEE) injection 14 Units, 14 Units, Subcutaneous, QHS, Val Riles, MD, 14 Units at 01/04/21 2145 .  lactulose (CHRONULAC) 10 GM/15ML solution 20 g, 20 g, Oral, BID PRN, Myrene Bougher, Tally Due, MD .  methocarbamol (ROBAXIN) tablet 500 mg, 500 mg, Oral, BID PRN, Max Sane, MD, 500 mg at 01/05/21 1453 .  multivitamin with minerals tablet 1 tablet, 1 tablet, Oral, Daily, Max Sane, MD, 1 tablet at 01/05/21 1021 .  OLANZapine (ZYPREXA) tablet 5 mg, 5 mg, Oral, Daily, Swayze, Ava, DO, 5 mg at 01/05/21 1022 .  ondansetron (ZOFRAN) tablet 4 mg, 4 mg, Oral, Q6H PRN **OR** ondansetron (ZOFRAN) injection 4 mg, 4 mg, Intravenous, Q6H PRN, Athena Masse, MD, 4 mg at 12/25/20 1946 .  pantoprazole (PROTONIX) EC tablet 40 mg, 40 mg, Oral, BID, Benita Gutter, RPH, 40 mg at 01/05/21 1021 .  pregabalin (LYRICA) capsule 75 mg, 75 mg, Oral, TID, Swayze, Ava, DO, 75 mg at 01/05/21 1022 .  rifaximin (XIFAXAN) tablet 550 mg, 550 mg, Oral, BID, Jonathon Bellows, MD, 550 mg at 01/05/21 1022 .  [START ON 01/06/2021] spironolactone (ALDACTONE) tablet 100 mg, 100 mg, Oral, Daily, Keita Demarco, Tally Due, MD .  [COMPLETED] thiamine 500mg  in normal saline (5ml) IVPB, 500 mg, Intravenous, Q8H, Last Rate: 100 mL/hr at 12/22/20 1648, 500 mg at 12/22/20 1648 **FOLLOWED BY** thiamine tablet 100 mg, 100 mg, Oral, Daily, Benita Gutter, RPH, 100 mg at 01/05/21 1022 .  vancomycin (VANCOREADY) IVPB 2000 mg/400 mL, 2,000 mg, Intravenous, Once, Rauer, Forde Dandy, RPH .   [START ON 01/06/2021] vancomycin (VANCOREADY) IVPB 2000 mg/400 mL, 2,000 mg, Intravenous, Q24H, Rauer, Forde Dandy, RPH .  Vitamin D (Ergocalciferol) (DRISDOL) capsule 50,000 Units, 50,000 Units, Oral, Q7 days, Val Riles, MD, 50,000 Units at 01/05/21 1021   Family History  Problem Relation Age of Onset  . Heart disease Mother   .  Heart attack Mother   . Hyperlipidemia Maternal Grandmother   . Diabetes Maternal Grandmother   . Hypertension Maternal Grandmother   . Hyperlipidemia Maternal Grandfather   . Heart disease Maternal Grandfather   . Heart attack Maternal Grandfather   . Diabetes Maternal Grandfather   . Lung cancer Maternal Grandfather   . Hypertension Maternal Grandfather   . Other Paternal Grandmother        Thyroid problems   . Heart disease Paternal Grandfather   . Stroke Paternal Grandfather   . Hyperlipidemia Paternal Grandfather   . Heart attack Paternal Grandfather   . Hypertension Paternal Grandfather      Social History   Tobacco Use  . Smoking status: Every Day    Packs/day: 0.50    Types: Cigarettes  . Smokeless tobacco: Never  Substance Use Topics  . Alcohol use: Yes  . Drug use: Yes    Types: IV, Cocaine    Allergies as of 12/17/2020 - Review Complete 12/17/2020  Allergen Reaction Noted  . Erythromycin Anaphylaxis 11/13/2011  . Sulfa antibiotics Anaphylaxis 11/13/2011  . Tylenol [acetaminophen] Other (See Comments) 09/26/2012  . Nsaids Other (See Comments) 09/26/2012  . Gabapentin  03/27/2018    Review of Systems:    All systems reviewed and negative except where noted in HPI.   Physical Exam:  Vital signs in last 24 hours: Temp:  [98.3 F (36.8 C)-99.3 F (37.4 C)] 98.4 F (36.9 C) (10/20 0744) Pulse Rate:  [97-107] 101 (10/20 0744) Resp:  [16-20] 18 (10/20 0744) BP: (102-109)/(59-72) 109/72 (10/20 0744) SpO2:  [91 %-98 %] 98 % (10/20 0744) Weight:  [105.3 kg] 105.3 kg (10/20 0500) Last BM Date: 01/03/21 General:    Ill-appearing, cooperative in NAD Head:  Normocephalic and atraumatic. Eyes:   No icterus.   Conjunctiva pink. PERRLA. Ears:  Normal auditory acuity. Neck:  Supple; no masses or thyroidomegaly Lungs: Respirations even and unlabored. Lungs clear to auscultation bilaterally.   No wheezes, crackles, or rhonchi.  Heart:  Regular rate and rhythm;  Without murmur, clicks, rubs or gallops Abdomen:  Soft, mildly distended, nontender. Normal bowel sounds. No appreciable masses or hepatomegaly.  No rebound or guarding.  Rectal:  Not performed. Msk:  Symmetrical without gross deformities.  Extremities: 2+edema, cyanosis or clubbing. Neurologic:  Alert and oriented x2;  grossly normal neurologically. Skin:  Intact without significant lesions or rashes. Psych:  Alert and cooperative. Normal affect.  LAB RESULTS: CBC Latest Ref Rng & Units 01/05/2021 01/04/2021 01/03/2021  WBC 4.0 - 10.5 K/uL 26.1(H) 27.3(H) 23.4(H)  Hemoglobin 12.0 - 15.0 g/dL 8.2(L) 8.5(L) 8.4(L)  Hematocrit 36.0 - 46.0 % 26.5(L) 27.0(L) 26.4(L)  Platelets 150 - 400 K/uL 214 223 203    BMET BMP Latest Ref Rng & Units 01/05/2021 01/04/2021 01/03/2021  Glucose 70 - 99 mg/dL 179(H) 179(H) 240(H)  BUN 6 - 20 mg/dL $Remove'19 17 18  'rASFyKE$ Creatinine 0.44 - 1.00 mg/dL 0.48 0.62 0.55  Sodium 135 - 145 mmol/L 136 135 138  Potassium 3.5 - 5.1 mmol/L 3.1(L) 3.3(L) 3.2(L)  Chloride 98 - 111 mmol/L 89(L) 87(L) 89(L)  CO2 22 - 32 mmol/L 37(H) 37(H) 38(H)  Calcium 8.9 - 10.3 mg/dL 7.3(L) 7.5(L) 7.6(L)    LFT Hepatic Function Latest Ref Rng & Units 01/05/2021 01/03/2021 01/02/2021  Total Protein 6.5 - 8.1 g/dL 6.1(L) 6.1(L) 6.1(L)  Albumin 3.5 - 5.0 g/dL <1.5(L) <1.5(L) <1.5(L)  AST 15 - 41 U/L 161(H) 174(H) 178(H)  ALT 0 - 44 U/L 31 34 34  Alk Phosphatase 38 - 126 U/L 190(H) 205(H) 183(H)  Total Bilirubin 0.3 - 1.2 mg/dL 9.9(H) 9.1(H) 9.2(H)  Bilirubin, Direct 0.0 - 0.2 mg/dL - - -     STUDIES: No results found.    Impression / Plan:    [name] is a 45 y.o. female with history of alcohol abuse, polysubstance abuse, bipolar, poorly controlled diabetes, FSH dystrophy who is admitted with pneumonia, alcoholic liver disease  Alcoholic liver disease  Elevated LFTs secondary to alcoholic liver disease AST more than 2 times ALT represents alcoholic liver disease Discriminant score less than 32, therefore patient does not meet the criteria for steroids Meld sodium is 19 Patient is volume overloaded with ascites and swelling of legs S/p paracentesis, which revealed SAAG greater than 1.1 consistent with portal hypertension, no evidence of SBP Agree with Lasix 40 twice daily, increase spironolactone to 100 mg daily Monitor kidney function and electrolytes closely There is no evidence of biliary obstruction based on ultrasound and MRI Hepatitis B antigen positive however HBV DNA negative, hepatitis B surface antigen negative, core antibody nonreactive, other viral etiologies are negative ?  Hepatic encephalopathy : With diarrhea, decrease lactulose to 20 g twice daily as needed, continue rifaximin 550 mg twice daily Complete abstinence from alcohol use Avoid hepatotoxic agents  Worsening neutrophilic leukocytosis Patient was treated for aspiration pneumonia with linezolid Due to worsening leukocytosis, Unasyn has been restarted on 10/19 Chest x-ray with no recurrence of pneumonia Repeat blood cultures in process Recommend repeat ascitic fluid analysis to rule out SBP, ordered  Severe protein calorie malnutrition Likely multifactorial Check prealbumin levels Recommend high-protein diet Recommend dietitian consult  Overall prognosis is guarded    LOS: 18 days   Sherri Sear, MD  01/05/2021, 3:02 PM    Note: This dictation was prepared with Dragon dictation along with smaller phrase technology. Any transcriptional errors that result from this process are unintentional.

## 2021-01-04 NOTE — Progress Notes (Addendum)
PROGRESS NOTE    [name]  HTX:[id number] DOB: Jul 27, 1975 DOA: 12/17/2020 PCP: Eunice Blase, PA-C   Chief complaint.  Altered Mental status Brief Narrative:  [name] is a 45 y.o. female with medical history significant for Bipolar disorder, diabetes mellitus alcohol use disorder and polysubstance abuse, FSH muscular dystrophy, with history of falls, last seen by neurology in January 2020, who was brought in by EMS was brought into the hospital after sustaining a fall and was unable to get up.  In the ED, patient was noted to have severe hyponatremia with sodium level of 113, potassium of 2.9.  Ammonia was elevated at 52 with bilirubin of 11.9.  Patient did have a right lower lobe infiltrate and received antibiotic with Unasyn and IV fluids.  She was also noted to have hepatic encephalopathy and was admitted to hospital.     Assessment & Plan:   Principal Problem:   Sepsis ([name]) Active Problems:   Alcohol withdrawal ([name])   Bipolar 2 disorder ([name])   Hypokalemia   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[name])   Acute hyponatremia   Alcohol use disorder, moderate, dependence ([name])   Hepatic steatosis   Polysubstance abuse ([name])   Hyperglycemia due to type 2 diabetes mellitus ([name])   Hyperbilirubinemia   Lactic acidosis   Alcoholic liver disease ([name])   Acute hepatic encephalopathy   Pressure injury of skin  Sepsis. Acute hypoxemic respiratory failure secondary to aspiration pneumonia Aspiration pneumonia. Persistent leukocytosis. Patient initially treated with vancomycin and Unasyn, has completed antibiotics.  Currently off oxygen. Wherever, patient had a persistent leukocytosis, seem to be worsened today.  There is a possibility that patient has aspirated again.  I rechecked a procalcitonin level, was elevated at 1.77.  Patient called to have developed another episode of aspiration.  I reviewed chest x-ray image performed on 10/17, have significant pneumonia.   However, patient could had aspiration with increased procalcitonin level, I will restart Unasyn for 5 days. Patient also had tunneled IJ catheter, unlikely source of infection.  However, will send culture from it.  Alcohol abuse and polydrug abuse. 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 Liver failure secondary to alcoholic liver cirrhosis. Acute hepatic encephalopathy. Patient still has significant jaundice, mental status had improved. Has been seen by Dr. Tobi Bastos. Continue lactulose, mental status has improved.  Hyponatremia. Hyperkalemia followed by hypokalemia Hypomagnesemia. Hypophosphatemia. Continue to follow, continue replete potassium and magnesium.  Type 2 diabetes. Continue current insulin.  Anemia of chronic disease.  Iron B12 levels are normal.    DVT prophylaxis: lovenox Code Status: full Family Communication: mother updated Disposition Plan:    Status is: Inpatient  Remains inpatient appropriate because: Severity of disease, still significant leukocytosis, started treatment for infection.        I/O last 3 completed shifts: In: 629.1 [I.V.:629.1] Out: 2600 [Urine:2600] Total I/O In: -  Out: 450 [Urine:450]     Consultants:  GI  Procedures: None  Antimicrobials: Rocephin  Subjective: Patient doing better today, any short of breath, off oxygen. No cough. No abdominal pain or nausea vomiting, complaining of abdominal distention. No fever or chills. No dysuria hematuria.  Objective: Vitals:   01/03/21 0751 01/03/21 1927 01/04/21 0321 01/04/21 0813  BP: 114/68 113/72 104/68 101/62  Pulse: 99 99 (!) 105 (!) 59  Resp: 16 18 16 16   Temp: 98.3 F (36.8 C) 98.4 F (36.9 C) 98.1 F (36.7 C) 97.7 F (36.5 C)  TempSrc: Oral Oral Oral Oral  SpO2: 93% 93% 93% 91%  Weight:      Height:        Intake/Output Summary (Last 24 hours) at 01/04/2021 1319 Last data filed at 01/04/2021 0710 Gross per 24  hour  Intake 629.11 ml  Output 1150 ml  Net -520.89 ml   Filed Weights   01/01/21 0543 01/02/21 0419 01/03/21 0441  Weight: 106.5 kg 105.8 kg 101.4 kg    Examination:  General exam: Appears calm and comfortable  Respiratory system: Clear to auscultation. Respiratory effort normal. Cardiovascular system: S1 & S2 heard, RRR. No JVD, murmurs, rubs, gallops or clicks. No pedal edema. Gastrointestinal system: Abdomen is distended, soft and nontender. No organomegaly or masses felt. Normal bowel sounds heard. Central nervous system: Alert and oriented x3. No focal neurological deficits. Extremities: Symmetric 5 x 5 power. Skin: No rashes, lesions or ulcers Psychiatry: Mood & affect appropriate.     Data Reviewed: I have personally reviewed following labs and imaging studies  CBC: Recent Labs  Lab 12/31/20 0540 01/01/21 0556 01/02/21 0535 01/03/21 0750 01/04/21 0311  WBC 18.2* 21.2* 24.4* 23.4* 27.3*  HGB 8.0* 8.6* 8.6* 8.4* 8.5*  HCT 26.7* 27.1* 28.0* 26.4* 27.0*  MCV 116.6* 114.8* 112.9* 113.3* 113.9*  PLT 120* 130* 153 203 223   Basic Metabolic Panel: Recent Labs  Lab 12/29/20 0514 12/30/20 0839 12/30/20 1434 12/31/20 0540 01/01/21 0556 01/02/21 0535 01/03/21 0750 01/04/21 0311  NA 139   < >  --  142 141  140 139 138 135  K 3.7   < >  --  3.0* 3.1*  3.0* 3.0* 3.2* 3.3*  CL 105   < >  --  100 96*  96* 94* 89* 87*  CO2 30   < >  --  36* 34*  37* 37* 38* 37*  GLUCOSE 152*   < >  --  138* 173*  174* 195* 240* 179*  BUN 23*   < >  --  20 16  17 16 18 17   CREATININE 0.66   < >  --  0.52 0.53  0.53 0.50 0.55 0.62  CALCIUM 8.3*   < >  --  8.3* 8.4*  8.2* 7.9* 7.6* 7.5*  MG 1.6*  --  1.4* 1.9  --   --  1.2* 1.6*  PHOS 2.8  --  2.2* 3.6  --   --  2.7 2.9   < > = values in this interval not displayed.   GFR: Estimated Creatinine Clearance: 109.8 mL/min (by C-G formula based on SCr of 0.62 mg/dL). Liver Function Tests: Recent Labs  Lab 12/30/20 1434  12/31/20 0540 01/01/21 0556 01/02/21 0535 01/03/21 0750  AST 195* 189* 183* 178* 174*  ALT 37 38 35 34 34  ALKPHOS 177* 165* 172* 183* 205*  BILITOT 10.0* 9.2* 8.8* 9.2* 9.1*  PROT 6.4* 6.4* 6.3* 6.1* 6.1*  ALBUMIN <1.5* <1.5* <1.5* <1.5* <1.5*   No results for input(s): LIPASE, AMYLASE in the last 168 hours. Recent Labs  Lab 12/29/20 0521 12/30/20 1434 12/31/20 0540 01/01/21 0556 01/02/21 0535  AMMONIA 41* 40* 66* 57* 46*   Coagulation Profile: Recent Labs  Lab 01/03/21 0750  INR 1.1   Cardiac Enzymes: No results for input(s): CKTOTAL, CKMB, CKMBINDEX, TROPONINI in the last 168 hours. BNP (last 3 results) No results for input(s): PROBNP in the last 8760 hours. HbA1C: No results for input(s): HGBA1C in the last 72 hours. CBG: Recent Labs  Lab 01/03/21 1119 01/03/21 1627 01/03/21 2124  01/04/21 0731 01/04/21 1124  GLUCAP 198* 286* 206* 180* 166*   Lipid Profile: No results for input(s): CHOL, HDL, LDLCALC, TRIG, CHOLHDL, LDLDIRECT in the last 72 hours. Thyroid Function Tests: No results for input(s): TSH, T4TOTAL, FREET4, T3FREE, THYROIDAB in the last 72 hours. Anemia Panel: Recent Labs    01/04/21 0311  TIBC 134*  IRON 42   Sepsis Labs: Recent Labs  Lab 01/04/21 0311  PROCALCITON 1.77    Recent Results (from the past 240 hour(s))  Body fluid culture w Gram Stain     Status: None   Collection Time: 12/30/20  9:45 AM   Specimen: PATH Cytology Peritoneal fluid  Result Value Ref Range Status   Specimen Description   Final    PERITONEAL Performed at [facility], [address], [place], [place] [address]    Special Requests   Final    NONE Performed at [facility], [address] [place], [place] [address]    Gram Stain   Final    WBC PRESENT,BOTH PMN AND MONONUCLEAR NO ORGANISMS SEEN CYTOSPIN SMEAR    Culture   Final    NO GROWTH 3 DAYS Performed at [facility], [address] [place], [place] [address]     Report Status 01/02/2021 FINAL  Final         Radiology Studies: No results found.      Scheduled Meds:  budesonide  9 mg Oral Daily   enoxaparin (LOVENOX) injection  0.5 mg/kg Subcutaneous Q24H   feeding supplement  237 mL Oral TID BM   FLUoxetine  80 mg Oral Daily   fluticasone  1 spray Each Nare Daily   folic acid  1 mg Oral Daily   furosemide  40 mg Intravenous BID   insulin aspart  0-20 Units Subcutaneous TID WC   insulin aspart  0-5 Units Subcutaneous QHS   insulin aspart  3 Units Subcutaneous TID WC   insulin glargine-yfgn  14 Units Subcutaneous QHS   lactulose  20 g Oral TID   multivitamin with minerals  1 tablet Oral Daily   OLANZapine  5 mg Oral Daily   pantoprazole  40 mg Oral BID   pregabalin  75 mg Oral TID   rifaximin  550 mg Oral BID   spironolactone  25 mg Oral Daily   thiamine  100 mg Oral Daily   Vitamin D (Ergocalciferol)  50,000 Units Oral Q7 days   Continuous Infusions:  sodium chloride Stopped (12/31/20 0118)   ampicillin-sulbactam (UNASYN) IV       LOS: 17 days    Time spent: 32 minutes    Marrion Coy, MD [facility]   To contact the attending provider between 7A-7P or the covering provider during after hours 7P-7A, please log into the web site [email or web address] and access using universal [place] password for that web site. If you do not have the password, please call the hospital operator.  01/04/2021, 1:19 PM

## 2021-01-04 NOTE — TOC Progression Note (Addendum)
Transition of Care [facility]) - Progression Note    Patient Details  Name: [name] MRN: [id number] Date of Birth: 1976/01/22  Transition of Care [facility]) CM/SW Contact  Margarito Liner, LCSW Phone Number: 01/04/2021, 8:19 AM  Clinical Narrative: No bed offers this morning. Patient does not have a PASARR noted on her FL2. Unable to log onto [place] [employer] website at this time. Will try again later to see if she has an existing PASARR or if she needs a new screening.  12:58 pm: PASARR screening was already started but under manual review. Uploaded requested clinicals into [facility].  2:49 pm: PASARR under level 2 review.  Expected Discharge Plan: Skilled Nursing Facility Barriers to Discharge: Continued Medical Work up  Expected Discharge Plan and Services Expected Discharge Plan: Skilled Nursing Facility     Post Acute Care Choice: Skilled Nursing Facility Living arrangements for the past 2 months: Single Family Home                                       Social Determinants of Health (SDOH) Interventions    Readmission Risk Interventions No flowsheet data found.

## 2021-01-04 NOTE — Evaluation (Signed)
Physical Therapy Evaluation Patient Details Name: [name] MRN: [id number] DOB: 1975-04-15 Today's Date: 01/04/2021  History of Present Illness  45 y.o. female with medical history significant for Bipolar disorder, diabetes, alcohol use disorder and polysubstance abuse, FSH muscular dystrophy, with history of frequent falls, last seen by neurology in January 2020, who was brought in by EMS after she fell at home 2 days prior and was unable to get up.   Clinical Impression  Pt received supine in bed eating a salad, mom in room and agreeable to therapy. In today's session pt presented with much improved motivation and cooperation. Mom also in room encouraging pt to "just try." Pt was able to perform supine<>sit with CGA - PT providing VC on sequencing however allowing pt the additional time to perform herself. Pt then remained sitting EOB for 15 minutes while performing tri-planar core stabilization exercises. 3-5 reps bilaterally each exercise, limited by pt fatigue. At end of session, pt was encouraged to participate with repositioning in bed and rolling left to place pillow under hip for weight shifting. Today's session indicates a recent improvement in functional bed mobility, stability/core strength and overall participation. Standing/transfers not safe unless +2 is present. Will continue to progress mobility to pt tolerance and agreeance. Would benefit from skilled PT to address above deficits and promote optimal return to PLOF.      Recommendations for follow up therapy are one component of a multi-disciplinary discharge planning process, led by the attending physician.  Recommendations may be updated based on patient status, additional functional criteria and insurance authorization.  Follow Up Recommendations SNF;Supervision for mobility/OOB    Equipment Recommendations  Other (comment) (TBD at next venue of care)    Recommendations for Other Services       Precautions /  Restrictions Precautions Precautions: Fall Restrictions Weight Bearing Restrictions: No      Mobility  Bed Mobility Overal bed mobility: Needs Assistance Bed Mobility: Supine to Sit;Sit to Supine Rolling: Min assist   Supine to sit: Min guard Sit to supine: Min guard   General bed mobility comments: Encouragement to participate with rolling, VC on sequencing, MIN A to complete roll at hip. CGA to complete supine<>sit - increased time and effort, bed features used    Transfers                 General transfer comment: deferred - focus on core stabilization and bed mobility, will attempt transfer with +2 assist  Ambulation/Gait                Stairs            Wheelchair Mobility    Modified Rankin (Stroke Patients Only)       Balance Overall balance assessment: Needs assistance Sitting-balance support: Feet supported Sitting balance-Leahy Scale: Fair Sitting balance - Comments: close supervision, occasional CGA due to L-ward lean                                     Pertinent Vitals/Pain Pain Assessment: Faces Faces Pain Scale: Hurts little more Pain Location: generalized - unable to describe location Pain Descriptors / Indicators: Discomfort Pain Intervention(s): Limited activity within patient's tolerance;Monitored during session;Repositioned    Home Living                        Prior Function  Hand Dominance        Extremity/Trunk Assessment                Communication      Cognition Arousal/Alertness: Awake/alert Behavior During Therapy: WFL for tasks assessed/performed Overall Cognitive Status: Within Functional Limits for tasks assessed                                 General Comments: improved motivation and cooperation with OOB activities/therapy      General Comments      Exercises Other Exercises Other Exercises: Core stabilization seated EOB with  no back support. Pt remained seated for 15 minutes. (1)  A-P trunk lean with VC on limitations and muscle engagement, PT guarding posteriorly; 5x each direction. (2)  lateral propping to elbow with stretch through contralateral side followed by press up to hand; 3x each direction. (3)  trunk rotation reaching across body for PT hand (target), PT increasing reach with each rep; x5 each direction.   Assessment/Plan    PT Assessment Patient needs continued PT services  PT Problem List Decreased strength;Decreased mobility;Decreased safety awareness;Decreased range of motion;Decreased activity tolerance;Cardiopulmonary status limiting activity;Decreased balance       PT Treatment Interventions DME instruction;Therapeutic activities;Gait training;Therapeutic exercise;Patient/family education;Stair training;Balance training;Functional mobility training;Neuromuscular re-education    PT Goals (Current goals can be found in the Care Plan section)  Acute Rehab PT Goals Patient Stated Goal: to get stronger and stand again PT Goal Formulation: With patient Time For Goal Achievement: 01/18/21 Potential to Achieve Goals: Fair    Frequency Min 2X/week   Barriers to discharge Inaccessible home environment      Co-evaluation               AM-PAC PT "6 Clicks" Mobility  Outcome Measure Help needed turning from your back to your side while in a flat bed without using bedrails?: A Little Help needed moving from lying on your back to sitting on the side of a flat bed without using bedrails?: A Little Help needed moving to and from a bed to a chair (including a wheelchair)?: A Lot Help needed standing up from a chair using your arms (e.g., wheelchair or bedside chair)?: Total Help needed to walk in hospital room?: Total Help needed climbing 3-5 steps with a railing? : Total 6 Click Score: 11    End of Session Equipment Utilized During Treatment: Oxygen Activity Tolerance: Patient limited by  fatigue;Patient limited by pain;Patient tolerated treatment well Patient left: with call bell/phone within reach;in bed;with bed alarm set;with family/visitor present Nurse Communication: Mobility status PT Visit Diagnosis: Other abnormalities of gait and mobility (R26.89);Muscle weakness (generalized) (M62.81);History of falling (Z91.81)    Time: 7902-4097 PT Time Calculation (min) (ACUTE ONLY): 26 min   Charges:   PT Evaluation $PT Re-evaluation: 1 Re-eval PT Treatments $Neuromuscular Re-education: 8-22 mins        Basilia Jumbo PT, DPT 01/04/21 4:28 PM [phone]

## 2021-01-05 ENCOUNTER — Inpatient Hospital Stay: Payer: Medicare Other

## 2021-01-05 DIAGNOSIS — R6520 Severe sepsis without septic shock: Secondary | ICD-10-CM | POA: Diagnosis not present

## 2021-01-05 DIAGNOSIS — J690 Pneumonitis due to inhalation of food and vomit: Secondary | ICD-10-CM | POA: Diagnosis not present

## 2021-01-05 DIAGNOSIS — K7682 Hepatic encephalopathy: Secondary | ICD-10-CM | POA: Diagnosis not present

## 2021-01-05 DIAGNOSIS — K709 Alcoholic liver disease, unspecified: Secondary | ICD-10-CM | POA: Diagnosis not present

## 2021-01-05 DIAGNOSIS — A419 Sepsis, unspecified organism: Secondary | ICD-10-CM | POA: Diagnosis not present

## 2021-01-05 DIAGNOSIS — K7011 Alcoholic hepatitis with ascites: Secondary | ICD-10-CM | POA: Diagnosis not present

## 2021-01-05 LAB — MAGNESIUM: Magnesium: 1.7 mg/dL (ref 1.7–2.4)

## 2021-01-05 LAB — CBC WITH DIFFERENTIAL/PLATELET
Abs Immature Granulocytes: 0.45 10*3/uL — ABNORMAL HIGH (ref 0.00–0.07)
Basophils Absolute: 0.1 10*3/uL (ref 0.0–0.1)
Basophils Relative: 0 %
Eosinophils Absolute: 0.2 10*3/uL (ref 0.0–0.5)
Eosinophils Relative: 1 %
HCT: 26.5 % — ABNORMAL LOW (ref 36.0–46.0)
Hemoglobin: 8.2 g/dL — ABNORMAL LOW (ref 12.0–15.0)
Immature Granulocytes: 2 %
Lymphocytes Relative: 9 %
Lymphs Abs: 2.4 10*3/uL (ref 0.7–4.0)
MCH: 34.9 pg — ABNORMAL HIGH (ref 26.0–34.0)
MCHC: 30.9 g/dL (ref 30.0–36.0)
MCV: 112.8 fL — ABNORMAL HIGH (ref 80.0–100.0)
Monocytes Absolute: 1.1 10*3/uL — ABNORMAL HIGH (ref 0.1–1.0)
Monocytes Relative: 4 %
Neutro Abs: 21.9 10*3/uL — ABNORMAL HIGH (ref 1.7–7.7)
Neutrophils Relative %: 84 %
Platelets: 214 10*3/uL (ref 150–400)
RBC: 2.35 MIL/uL — ABNORMAL LOW (ref 3.87–5.11)
RDW: 21.2 % — ABNORMAL HIGH (ref 11.5–15.5)
Smear Review: NORMAL
WBC: 26.1 10*3/uL — ABNORMAL HIGH (ref 4.0–10.5)
nRBC: 0.3 % — ABNORMAL HIGH (ref 0.0–0.2)

## 2021-01-05 LAB — GLUCOSE, CAPILLARY
Glucose-Capillary: 160 mg/dL — ABNORMAL HIGH (ref 70–99)
Glucose-Capillary: 185 mg/dL — ABNORMAL HIGH (ref 70–99)
Glucose-Capillary: 228 mg/dL — ABNORMAL HIGH (ref 70–99)
Glucose-Capillary: 227 mg/dL — ABNORMAL HIGH (ref 70–99)

## 2021-01-05 LAB — COMPREHENSIVE METABOLIC PANEL
ALT: 31 U/L (ref 0–44)
AST: 161 U/L — ABNORMAL HIGH (ref 15–41)
Albumin: <1.5 g/dL — ABNORMAL LOW (ref 3.5–5.0)
Alkaline Phosphatase: 190 U/L — ABNORMAL HIGH (ref 38–126)
Anion gap: 10 (ref 5–15)
BUN: 19 mg/dL (ref 6–20)
CO2: 37 mmol/L — ABNORMAL HIGH (ref 22–32)
Calcium: 7.3 mg/dL — ABNORMAL LOW (ref 8.9–10.3)
Chloride: 89 mmol/L — ABNORMAL LOW (ref 98–111)
Creatinine, Ser: 0.48 mg/dL (ref 0.44–1.00)
GFR, Estimated: >60 mL/min (ref >60)
Glucose, Bld: 179 mg/dL — ABNORMAL HIGH (ref 70–99)
Potassium: 3.1 mmol/L — ABNORMAL LOW (ref 3.5–5.1)
Sodium: 136 mmol/L (ref 135–145)
Total Bilirubin: 9.9 mg/dL — ABNORMAL HIGH (ref 0.3–1.2)
Total Protein: 6.1 g/dL — ABNORMAL LOW (ref 6.5–8.1)

## 2021-01-05 LAB — PROCALCITONIN: Procalcitonin: 2.73 ng/mL

## 2021-01-05 LAB — PHOSPHORUS: Phosphorus: 2.7 mg/dL (ref 2.5–4.6)

## 2021-01-05 LAB — PREALBUMIN: Prealbumin: 5.7 mg/dL — ABNORMAL LOW (ref 18–38)

## 2021-01-05 LAB — FERRITIN: Ferritin: 368 ng/mL — ABNORMAL HIGH (ref 11–307)

## 2021-01-05 MED ORDER — VANCOMYCIN HCL 2000 MG/400ML IV SOLN
2000.0000 mg | INTRAVENOUS | Status: DC
  Filled 2021-01-05: qty 400

## 2021-01-05 MED ORDER — LACTULOSE 10 GM/15ML PO SOLN: 20.0000 g | Freq: Two times a day (BID) | ORAL | Status: DC | PRN

## 2021-01-05 MED ORDER — VANCOMYCIN HCL 10 G IV SOLR: 2000.0000 mg | Freq: Once | INTRAVENOUS | Status: DC

## 2021-01-05 MED ORDER — VANCOMYCIN HCL 2000 MG/400ML IV SOLN
2000.0000 mg | Freq: Once | INTRAVENOUS | Status: AC
  Administered 2021-01-05: 2000 mg via INTRAVENOUS
  Filled 2021-01-05: qty 400

## 2021-01-05 MED ORDER — POTASSIUM CHLORIDE 10 MEQ/100ML IV SOLN
10.0000 meq | INTRAVENOUS | Status: AC
  Administered 2021-01-05 (×4): 10 meq via INTRAVENOUS
  Filled 2021-01-05 (×4): qty 100

## 2021-01-05 MED ORDER — SPIRONOLACTONE 25 MG PO TABS
100.0000 mg | ORAL_TABLET | Freq: Every day | ORAL | Status: DC
  Administered 2021-01-06 – 2021-01-26 (×21): 100 mg via ORAL
  Filled 2021-01-05 (×21): qty 4

## 2021-01-05 NOTE — Progress Notes (Addendum)
Pharmacy Antibiotic Note  [name] is a 45 y.o. female admitted on 12/17/2020 with pneumonia.  Pharmacy has been consulted for vancomycin dosing. Pt also started on 5 day course Unasyn. Pt persistent leukocytosis, PCT 1.57>1.77>2.73. Pt received 10 days MRSA PNA coverage with vanc/linezolid from 10/4-10/11 (had positive MRSA PCR 10/3).  Discussed previous antibiotic course with MD; possible alternative source of infection could be IV line.   Plan: Vancomycin 2 g IV x1 loading dose followed by 2 g IV q24h Est AUC: 494.4 Scr used 0.8 (rounded up), IBW, Vd 0.5 Obtain vanc level around 4th or 5th dose if continued Follow up cultures   Height: 5\' 7"  (170.2 cm) Weight: 105.3 kg (232 lb 3.2 oz) IBW/kg (Calculated) : 61.6  Temp (24hrs), Avg:98.5 F (36.9 C), Min:98.3 F (36.8 C), Max:99.3 F (37.4 C)  Recent Labs  Lab 01/01/21 0556 01/02/21 0535 01/03/21 0750 01/04/21 0311 01/05/21 0556  WBC 21.2* 24.4* 23.4* 27.3* 26.1*  CREATININE 0.53  0.53 0.50 0.55 0.62 0.48    Estimated Creatinine Clearance: 112.1 mL/min (by C-G formula based on SCr of 0.48 mg/dL).    Allergies  Allergen Reactions   Erythromycin Anaphylaxis   Sulfa Antibiotics Anaphylaxis   Tylenol [Acetaminophen] Other (See Comments)    Pancreatitis and liver dysfunction, not supposed to take med   Nsaids Other (See Comments)    Causes GI problems, very bad reaction-per patient.    Gabapentin     Mood changes     Antimicrobials this admission: Unasyn 10/2-10/4, 10/19 >>  Vancomycin 10/4-10/6, 10/20 >>  Linezolid 10/6 >> 10/11  Dose adjustments this admission:   Microbiology results: 10/2 Bcx: NG 10/19 BCx: NG<24h 10/3 MRSA PCR: positive 10/14 Peritoneal fluid: NG 10/19 Cath tip: sent  Thank you for allowing pharmacy to be a part of this patient's care.  Zackaria Burkey O Taunja Brickner 01/05/2021 1:25 PM

## 2021-01-05 NOTE — Progress Notes (Signed)
PROGRESS NOTE    [name]  VHQ:[id number] DOB: 14-Aug-1975 DOA: 12/17/2020 PCP: Eunice Blase, PA-C    Brief Narrative:  [name] is a 45 y.o. female with medical history significant for Bipolar disorder, diabetes mellitus alcohol use disorder and polysubstance abuse, FSH muscular dystrophy, with history of falls, last seen by neurology in January 2020, who was brought in by EMS was brought into the hospital after sustaining a fall and was unable to get up.  In the ED, patient was noted to have severe hyponatremia with sodium level of 113, potassium of 2.9.  Ammonia was elevated at 52 with bilirubin of 11.9.  Patient did have a right lower lobe infiltrate and received antibiotic with Unasyn and IV fluids.  She was also noted to have hepatic encephalopathy and was admitted to hospital.     Assessment & Plan:   Principal Problem:   Sepsis ([name]) Active Problems:   Alcohol withdrawal ([name])   Bipolar 2 disorder ([name])   Hypokalemia   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[name])   Acute hyponatremia   Alcohol use disorder, moderate, dependence ([name])   Hepatic steatosis   Polysubstance abuse ([name])   Hyperglycemia due to type 2 diabetes mellitus ([name])   Hyperbilirubinemia   Lactic acidosis   Alcoholic liver disease ([name])   Acute hepatic encephalopathy   Pressure injury of skin   Aspiration pneumonia of both lower lobes due to gastric secretions ([name])  Sepsis. Acute hypoxemic respiratory failure secondary to aspiration pneumonia Aspiration pneumonia. Persistent leukocytosis. Patient had a worsening leukocytosis, placed back on oxygen.  Most likely due to aspiration pneumonia, procalcitonin level significantly elevated now. I will repeat a chest x-ray today to identify pneumonia. Blood culture from central line and peripheral still pending.   Will continue Unasyn, patient also had positive MRSA culture.  We will add vancomycin.  Alcohol abuse and polydrug  abuse. 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 Liver failure secondary to alcoholic liver cirrhosis. Acute hepatic encephalopathy. Patient will be seen by GI again today.  Patient no longer had any confusion.  Hyponatremia. Hyperkalemia followed by hypokalemia Hypomagnesemia. Hypophosphatemia. Continue replete potassium.  Depression. Patient appears to be depressed, poor motivation.  Will obtain psych consult.  Type 2 diabetes. Continue current regimen   Anemia of chronic disease.   DVT prophylaxis: lovenox Code Status: full Family Communication:  Disposition Plan:      Status is: Inpatient   Remains inpatient appropriate because: Severity of disease, still significant leukocytosis, started treatment for infection.    Consultants:  GI   Procedures: None   Antimicrobials: Unasyn and vancomycin.       I/O last 3 completed shifts: In: 1169.1 [P.O.:240; I.V.:629.1; IV Piggyback:300] Out: 2250 [Urine:2250] No intake/output data recorded.     Subjective: Patient currently requiring 2 L oxygen, short of breath with exertion.  Cough, nonproductive. No fever or chills. No dysuria hematuria  No abdominal pain or nausea vomiting. Chest pain or palpitation.  Objective: Vitals:   01/04/21 1935 01/05/21 0431 01/05/21 0500 01/05/21 0744  BP: 102/61 105/72  109/72  Pulse: 97 97  (!) 101  Resp: 18 16  18   Temp: 98.3 F (36.8 C) 98.3 F (36.8 C)  98.4 F (36.9 C)  TempSrc: Oral Oral  Oral  SpO2: 93% 94%  98%  Weight:   105.3 kg   Height:        Intake/Output Summary (Last 24 hours) at 01/05/2021 1136 Last data filed at 01/05/2021 [id number]  Gross per 24 hour  Intake 540 ml  Output --  Net 540 ml   Filed Weights   01/02/21 0419 01/03/21 0441 01/05/21 0500  Weight: 105.8 kg 101.4 kg 105.3 kg    Examination:  General exam: Appears calm and comfortable  Respiratory system: Clear to auscultation. Respiratory effort  normal. Cardiovascular system: S1 & S2 heard, RRR. No JVD, murmurs, rubs, gallops or clicks. No pedal edema. Gastrointestinal system: Abdomen is mildly distended, soft and nontender. No organomegaly or masses felt. Normal bowel sounds heard. Central nervous system: Alert and oriented x3. No focal neurological deficits. Extremities: Symmetric 5 x 5 power. Skin:jaundice Psychiatry: Judgement and insight appear normal.  Depressed mood.   Data Reviewed: I have personally reviewed following labs and imaging studies  CBC: Recent Labs  Lab 01/01/21 0556 01/02/21 0535 01/03/21 0750 01/04/21 0311 01/05/21 0556  WBC 21.2* 24.4* 23.4* 27.3* 26.1*  NEUTROABS  --   --   --   --  21.9*  HGB 8.6* 8.6* 8.4* 8.5* 8.2*  HCT 27.1* 28.0* 26.4* 27.0* 26.5*  MCV 114.8* 112.9* 113.3* 113.9* 112.8*  PLT 130* 153 203 223 214   Basic Metabolic Panel: Recent Labs  Lab 12/30/20 1434 12/31/20 0540 01/01/21 0556 01/02/21 0535 01/03/21 0750 01/04/21 0311 01/05/21 0556  NA  --  142 141  140 139 138 135 136  K  --  3.0* 3.1*  3.0* 3.0* 3.2* 3.3* 3.1*  CL  --  100 96*  96* 94* 89* 87* 89*  CO2  --  36* 34*  37* 37* 38* 37* 37*  GLUCOSE  --  138* 173*  174* 195* 240* 179* 179*  BUN  --  20 16  17 16 18 17 19   CREATININE  --  0.52 0.53  0.53 0.50 0.55 0.62 0.48  CALCIUM  --  8.3* 8.4*  8.2* 7.9* 7.6* 7.5* 7.3*  MG 1.4* 1.9  --   --  1.2* 1.6* 1.7  PHOS 2.2* 3.6  --   --  2.7 2.9 2.7   GFR: Estimated Creatinine Clearance: 112.1 mL/min (by C-G formula based on SCr of 0.48 mg/dL). Liver Function Tests: Recent Labs  Lab 12/31/20 0540 01/01/21 0556 01/02/21 0535 01/03/21 0750 01/05/21 0556  AST 189* 183* 178* 174* 161*  ALT 38 35 34 34 31  ALKPHOS 165* 172* 183* 205* 190*  BILITOT 9.2* 8.8* 9.2* 9.1* 9.9*  PROT 6.4* 6.3* 6.1* 6.1* 6.1*  ALBUMIN <1.5* <1.5* <1.5* <1.5* <1.5*   No results for input(s): LIPASE, AMYLASE in the last 168 hours. Recent Labs  Lab 12/30/20 1434  12/31/20 0540 01/01/21 0556 01/02/21 0535  AMMONIA 40* 66* 57* 46*   Coagulation Profile: Recent Labs  Lab 01/03/21 0750  INR 1.1   Cardiac Enzymes: No results for input(s): CKTOTAL, CKMB, CKMBINDEX, TROPONINI in the last 168 hours. BNP (last 3 results) No results for input(s): PROBNP in the last 8760 hours. HbA1C: No results for input(s): HGBA1C in the last 72 hours. CBG: Recent Labs  Lab 01/04/21 0731 01/04/21 1124 01/04/21 1640 01/04/21 2140 01/05/21 0747  GLUCAP 180* 166* 291* 219* 160*   Lipid Profile: No results for input(s): CHOL, HDL, LDLCALC, TRIG, CHOLHDL, LDLDIRECT in the last 72 hours. Thyroid Function Tests: No results for input(s): TSH, T4TOTAL, FREET4, T3FREE, THYROIDAB in the last 72 hours. Anemia Panel: Recent Labs    01/04/21 0311  TIBC 134*  IRON 42   Sepsis Labs: Recent Labs  Lab 01/04/21 0311 01/05/21 0556  PROCALCITON  1.77 2.73    Recent Results (from the past 240 hour(s))  Body fluid culture w Gram Stain     Status: None   Collection Time: 12/30/20  9:45 AM   Specimen: PATH Cytology Peritoneal fluid  Result Value Ref Range Status   Specimen Description   Final    PERITONEAL Performed at [facility], [address], [place], [place] [address]    Special Requests   Final    NONE Performed at [facility], [address] [place], [place] [address]    Gram Stain   Final    WBC PRESENT,BOTH PMN AND MONONUCLEAR NO ORGANISMS SEEN CYTOSPIN SMEAR    Culture   Final    NO GROWTH 3 DAYS Performed at [facility], [address], [place], [place] [address]    Report Status 01/02/2021 FINAL  Final  Culture, blood (single) w Reflex to ID Panel     Status: None (Preliminary result)   Collection Time: 01/04/21  2:00 PM   Specimen: BLOOD  Result Value Ref Range Status   Specimen Description   Final    BLOOD ALIN Performed at [facility], [address], [place], [place] [address]    Special  Requests   Final    BOTTLES DRAWN AEROBIC AND ANAEROBIC Blood Culture results may not be optimal due to an excessive volume of blood received in culture bottles Performed at [facility], [address] [place], [place] [address]    Culture   Final    NO GROWTH < 24 HOURS Performed at [facility], [address], [place], [place] [address]    Report Status PENDING  Incomplete  Culture, blood (single) w Reflex to ID Panel     Status: None (Preliminary result)   Collection Time: 01/04/21  2:35 PM   Specimen: BLOOD  Result Value Ref Range Status   Specimen Description BLOOD LEFT ANTECUBITAL  Final   Special Requests   Final    BOTTLES DRAWN AEROBIC AND ANAEROBIC Blood Culture adequate volume   Culture   Final    NO GROWTH < 24 HOURS Performed at [facility], [address] [place], [place] [address]    Report Status PENDING  Incomplete         Radiology Studies: No results found.      Scheduled Meds:  budesonide  9 mg Oral Daily   enoxaparin (LOVENOX) injection  0.5 mg/kg Subcutaneous Q24H   feeding supplement  237 mL Oral TID BM   FLUoxetine  80 mg Oral Daily   fluticasone  1 spray Each Nare Daily   folic acid  1 mg Oral Daily   furosemide  40 mg Intravenous BID   insulin aspart  0-20 Units Subcutaneous TID WC   insulin aspart  0-5 Units Subcutaneous QHS   insulin aspart  3 Units Subcutaneous TID WC   insulin glargine-yfgn  14 Units Subcutaneous QHS   lactulose  20 g Oral TID   multivitamin with minerals  1 tablet Oral Daily   OLANZapine  5 mg Oral Daily   pantoprazole  40 mg Oral BID   pregabalin  75 mg Oral TID   rifaximin  550 mg Oral BID   spironolactone  25 mg Oral Daily   thiamine  100 mg Oral Daily   Vitamin D (Ergocalciferol)  50,000 Units Oral Q7 days   Continuous Infusions:  sodium chloride Stopped (12/31/20 0118)   ampicillin-sulbactam (UNASYN) IV  3 g (01/05/21 1012)   potassium chloride 10 mEq (01/05/21 1121)      LOS: 18 days    Time spent: 32 minutes    Marrion Coy, MD [facility]   To contact the attending provider between 7A-7P or the covering provider during after hours 7P-7A, please log into the web site [email or web address] and access using universal [place] password for that web site. If you do not have the password, please call the hospital operator.  01/05/2021, 11:36 AM

## 2021-01-05 NOTE — TOC Progression Note (Addendum)
Transition of Care [facility]) - Progression Note    Patient Details  Name: [name] MRN: [id number] Date of Birth: 1976/01/30  [facility]) CM/SW Contact  Margarito Liner, LCSW Phone Number: 01/05/2021, 9:11 AM  Clinical Narrative:  Asked TOC supervisor to review patient for adding to difficult to place list.   3:25 pm: No bed offers.  Expected Discharge Plan: Skilled Nursing Facility Barriers to Discharge: Continued Medical Work up  Expected Discharge Plan and Services Expected Discharge Plan: Skilled Nursing Facility     Post Acute Care Choice: Skilled Nursing Facility Living arrangements for the past 2 months: Single Family Home                                       Social Determinants of Health (SDOH) Interventions    Readmission Risk Interventions No flowsheet data found.

## 2021-01-05 NOTE — Progress Notes (Signed)
Occupational Therapy Treatment Patient Details Name: [name] MRN: [id number] DOB: 1975/10/17 Today's Date: 01/05/2021   History of present illness 45 y.o. female with medical history significant for Bipolar disorder, diabetes, alcohol use disorder and polysubstance abuse, FSH muscular dystrophy, with history of frequent falls, last seen by neurology in January 2020, who was brought in by EMS after she fell at home 2 days prior and was unable to get up.   OT comments  Ms. Mcquerry seen for OT treatment on this date. Upon arrival to room pt awake/alert. Pt seated upright in bed. Pt very tearful while reporting "she doesn't know if she will ever be able to go home again" and "she doesn't know if she will make it out of here" during therapist discussion of goals and POC. Therapeutic listening provided and therapeutic use of self utilized for encouragement and motivation. Reviewed HEP written on board and pt consented to perform HEP independently throughout the day. Therapist communicated with tx team about pt condition. Pt progressing toward goals. Pt continues to benefit from skilled OT services to maximize return to PLOF and minimize risk of future falls, injury, caregiver burden, and readmission. Will continue to follow POC. Discharge recommendation remains appropriate.     Recommendations for follow up therapy are one component of a multi-disciplinary discharge planning process, led by the attending physician.  Recommendations may be updated based on patient status, additional functional criteria and insurance authorization.    Follow Up Recommendations  SNF    Equipment Recommendations  3 in 1 bedside commode    Recommendations for Other Services      Precautions / Restrictions Precautions Precautions: Fall Restrictions Weight Bearing Restrictions: No       Mobility Bed Mobility               General bed mobility comments: Pt refused    Transfers                       Balance                                           ADL either performed or assessed with clinical judgement   ADL                                         General ADL Comments: Pt refused     Vision       Perception     Praxis      Cognition Arousal/Alertness: Awake/alert Behavior During Therapy: Anxious (Worried about present health condition, future plans, time in chair) Overall Cognitive Status: Within Functional Limits for tasks assessed                                          Exercises Exercises: Other exercises Other Exercises Other Exercises: Pt educ re: importance of mvmt for functional mobility, importance of working towards goals Other Exercises: Pt discussed current state, therapeutic use of self, reviewed HEP and pt consented to complete independently   Shoulder Instructions       General Comments      Pertinent Vitals/ Pain       Pain Assessment:  No/denies pain  Home Living                                          Prior Functioning/Environment              Frequency  Min 2X/week        Progress Toward Goals  OT Goals(current goals can now be found in the care plan section)  Progress towards OT goals: Progressing toward goals  Acute Rehab OT Goals Patient Stated Goal: to get stronger and stand again OT Goal Formulation: With patient Time For Goal Achievement: 01/17/21 Potential to Achieve Goals: Fair ADL Goals Pt Will Perform Grooming: sitting;with modified independence Pt Will Perform Lower Body Dressing: with min assist;sit to/from stand Pt Will Transfer to Toilet: with min assist;squat pivot transfer;bedside commode Pt Will Perform Toileting - Clothing Manipulation and hygiene: with min assist;sit to/from stand  Plan Discharge plan remains appropriate;Frequency remains appropriate    Co-evaluation                 AM-PAC OT "6 Clicks" Daily  Activity     Outcome Measure   Help from another person eating meals?: A Little Help from another person taking care of personal grooming?: A Little Help from another person toileting, which includes using toliet, bedpan, or urinal?: A Lot Help from another person bathing (including washing, rinsing, drying)?: A Lot Help from another person to put on and taking off regular upper body clothing?: A Little Help from another person to put on and taking off regular lower body clothing?: A Lot 6 Click Score: 15    End of Session    OT Visit Diagnosis: Unsteadiness on feet (R26.81);Muscle weakness (generalized) (M62.81);History of falling (Z91.81)   Activity Tolerance Patient limited by fatigue   Patient Left in bed;with call bell/phone within reach   Nurse Communication          Time: 2440-1027 OT Time Calculation (min): 11 min  Charges: OT General Charges $OT Visit: 1 Visit OT Treatments $Therapeutic Activity: 8-22 mins  [place] [facility], Adine Madura 01/05/2021, 11:28 AM

## 2021-01-05 NOTE — Progress Notes (Signed)
PT Cancellation Note  Patient Details Name: [name] MRN: [id number] DOB: 08-18-1975   Cancelled Treatment:    Reason Eval/Treat Not Completed: Patient declined, no reason specified  Pt reporting that she would "just rather be alone today". Offered support and to reposition in bed but patient declining at this time. Will re-attempt tomorrow.  Verl Blalock, SPT   Verl Blalock 01/05/2021, 12:06 PM

## 2021-01-05 NOTE — Plan of Care (Signed)
  Problem: Clinical Measurements: Goal: Ability to maintain clinical measurements within normal limits will improve Outcome: Progressing Goal: Will remain free from infection Outcome: Progressing Goal: Diagnostic test results will improve Outcome: Progressing Goal: Respiratory complications will improve Outcome: Progressing Goal: Cardiovascular complication will be avoided Outcome: Progressing   Problem: Pain Managment: Goal: General experience of comfort will improve Outcome: Progressing   Pt is involved in and agrees with the plan of care. V/S stable. Complained of abdominal spasms; bentyl given. Pt has had several episodes of watery stool; Lactulose not given. Still on oxygen at 2lpm/[place] with sats at 93%.

## 2021-01-05 NOTE — Consult Note (Signed)
  Psychiatry consult: Chart reviewed.  I am familiar with this patient from previous treatment contact.  Attempted to come see the patient on the ward this afternoon but she was off the ward having a procedure performed.  I will follow up tomorrow.

## 2021-01-06 ENCOUNTER — Inpatient Hospital Stay: Payer: Medicare Other

## 2021-01-06 DIAGNOSIS — J690 Pneumonitis due to inhalation of food and vomit: Secondary | ICD-10-CM | POA: Diagnosis not present

## 2021-01-06 DIAGNOSIS — E1165 Type 2 diabetes mellitus with hyperglycemia: Secondary | ICD-10-CM | POA: Diagnosis not present

## 2021-01-06 DIAGNOSIS — K7682 Hepatic encephalopathy: Secondary | ICD-10-CM | POA: Diagnosis not present

## 2021-01-06 LAB — BODY FLUID CELL COUNT WITH DIFFERENTIAL
Eos, Fluid: 0 %
Lymphs, Fluid: 24 %
Monocyte-Macrophage-Serous Fluid: 61 %
Neutrophil Count, Fluid: 15 %
Total Nucleated Cell Count, Fluid: 96 cu mm

## 2021-01-06 LAB — CBC WITH DIFFERENTIAL/PLATELET
Abs Immature Granulocytes: 0.57 10*3/uL — ABNORMAL HIGH (ref 0.00–0.07)
Basophils Absolute: 0.1 10*3/uL (ref 0.0–0.1)
Basophils Relative: 0 %
Eosinophils Absolute: 0.2 10*3/uL (ref 0.0–0.5)
Eosinophils Relative: 1 %
HCT: 25.6 % — ABNORMAL LOW (ref 36.0–46.0)
Hemoglobin: 7.9 g/dL — ABNORMAL LOW (ref 12.0–15.0)
Immature Granulocytes: 2 %
Lymphocytes Relative: 8 %
Lymphs Abs: 2.2 10*3/uL (ref 0.7–4.0)
MCH: 34.8 pg — ABNORMAL HIGH (ref 26.0–34.0)
MCHC: 30.9 g/dL (ref 30.0–36.0)
MCV: 112.8 fL — ABNORMAL HIGH (ref 80.0–100.0)
Monocytes Absolute: 1.1 10*3/uL — ABNORMAL HIGH (ref 0.1–1.0)
Monocytes Relative: 4 %
Neutro Abs: 22.6 10*3/uL — ABNORMAL HIGH (ref 1.7–7.7)
Neutrophils Relative %: 85 %
Platelets: 193 10*3/uL (ref 150–400)
RBC: 2.27 MIL/uL — ABNORMAL LOW (ref 3.87–5.11)
RDW: 20.9 % — ABNORMAL HIGH (ref 11.5–15.5)
WBC: 26.8 10*3/uL — ABNORMAL HIGH (ref 4.0–10.5)
nRBC: 0.2 % (ref 0.0–0.2)

## 2021-01-06 LAB — ALBUMIN, PLEURAL OR PERITONEAL FLUID: Albumin, Fluid: <1.5 g/dL

## 2021-01-06 LAB — COMPREHENSIVE METABOLIC PANEL
ALT: 30 U/L (ref 0–44)
AST: 158 U/L — ABNORMAL HIGH (ref 15–41)
Albumin: <1.5 g/dL — ABNORMAL LOW (ref 3.5–5.0)
Alkaline Phosphatase: 198 U/L — ABNORMAL HIGH (ref 38–126)
Anion gap: 9 (ref 5–15)
BUN: 19 mg/dL (ref 6–20)
CO2: 36 mmol/L — ABNORMAL HIGH (ref 22–32)
Calcium: 7.3 mg/dL — ABNORMAL LOW (ref 8.9–10.3)
Chloride: 91 mmol/L — ABNORMAL LOW (ref 98–111)
Creatinine, Ser: 0.55 mg/dL (ref 0.44–1.00)
GFR, Estimated: >60 mL/min (ref >60)
Glucose, Bld: 247 mg/dL — ABNORMAL HIGH (ref 70–99)
Potassium: 3.2 mmol/L — ABNORMAL LOW (ref 3.5–5.1)
Sodium: 136 mmol/L (ref 135–145)
Total Bilirubin: 9.8 mg/dL — ABNORMAL HIGH (ref 0.3–1.2)
Total Protein: 6.3 g/dL — ABNORMAL LOW (ref 6.5–8.1)

## 2021-01-06 LAB — GLUCOSE, CAPILLARY
Glucose-Capillary: 235 mg/dL — ABNORMAL HIGH (ref 70–99)
Glucose-Capillary: 195 mg/dL — ABNORMAL HIGH (ref 70–99)
Glucose-Capillary: 316 mg/dL — ABNORMAL HIGH (ref 70–99)
Glucose-Capillary: 248 mg/dL — ABNORMAL HIGH (ref 70–99)

## 2021-01-06 LAB — PROCALCITONIN: Procalcitonin: 2.54 ng/mL

## 2021-01-06 LAB — MAGNESIUM: Magnesium: 1.5 mg/dL — ABNORMAL LOW (ref 1.7–2.4)

## 2021-01-06 LAB — PROTEIN, PLEURAL OR PERITONEAL FLUID: Total protein, fluid: <3 g/dL

## 2021-01-06 MED ORDER — MAGNESIUM SULFATE IN D5W 1-5 GM/100ML-% IV SOLN
1.0000 g | Freq: Once | INTRAVENOUS | Status: AC
  Administered 2021-01-06: 1 g via INTRAVENOUS
  Filled 2021-01-06: qty 100

## 2021-01-06 MED ORDER — ALBUMIN HUMAN 25 % IV SOLN
25.0000 g | Freq: Once | INTRAVENOUS | Status: AC
  Administered 2021-01-06: 25 g via INTRAVENOUS
  Filled 2021-01-06: qty 100

## 2021-01-06 MED ORDER — FUROSEMIDE 40 MG PO TABS
40.0000 mg | ORAL_TABLET | Freq: Every day | ORAL | Status: DC
  Administered 2021-01-06 – 2021-01-16 (×11): 40 mg via ORAL
  Filled 2021-01-06 (×11): qty 1

## 2021-01-06 MED ORDER — INSULIN GLARGINE-YFGN 100 UNIT/ML ~~LOC~~ SOLN
18.0000 [IU] | Freq: Every day | SUBCUTANEOUS | Status: DC
  Administered 2021-01-06: 18 [IU] via SUBCUTANEOUS
  Filled 2021-01-06: qty 0.18

## 2021-01-06 MED ORDER — MAGNESIUM SULFATE 2 GM/50ML IV SOLN
2.0000 g | Freq: Once | INTRAVENOUS | Status: AC
  Administered 2021-01-06: 2 g via INTRAVENOUS
  Filled 2021-01-06: qty 50

## 2021-01-06 MED ORDER — MAGNESIUM SULFATE 50 % IJ SOLN: 3.0000 g | Freq: Once | INTRAVENOUS | Status: DC

## 2021-01-06 MED ORDER — POTASSIUM CHLORIDE 10 MEQ/100ML IV SOLN
10.0000 meq | INTRAVENOUS | Status: AC
  Administered 2021-01-06 (×2): 10 meq via INTRAVENOUS
  Filled 2021-01-06 (×2): qty 100

## 2021-01-06 MED ORDER — POTASSIUM CHLORIDE 10 MEQ/100ML IV SOLN
10.0000 meq | INTRAVENOUS | Status: DC
  Administered 2021-01-06 (×2): 10 meq via INTRAVENOUS
  Filled 2021-01-06 (×2): qty 100

## 2021-01-06 NOTE — Progress Notes (Signed)
Inpatient Diabetes Program Recommendations  AACE/ADA: New Consensus Statement on Inpatient Glycemic Control (2015)  Target Ranges:  Prepandial:   less than 140 mg/dL      Peak postprandial:   less than 180 mg/dL (1-2 hours)      Critically ill patients:  140 - 180 mg/dL  Results for [name], [name] (MRN [id number]) as of 01/06/2021 11:15  Ref. Range 01/05/2021 07:47 01/05/2021 11:54 01/05/2021 16:45 01/05/2021 21:43  Glucose-Capillary Latest Ref Range: 70 - 99 mg/dL 297 (H)  7 units Novolog @1017   185 (H)  7 units Novolog @1233   228 (H)  10 units Novolog @1708   227 (H)  2 units Novolog  14 units Semglee  Results for [name], [name] (MRN ) as of 01/06/2021 11:15  Ref. Range 01/06/2021 08:06  Glucose-Capillary Latest Ref Range: 70 - 99 mg/dL [id number] (H)  10 units Novolog     Home DM Meds: Janumet XR 100/1000 mg Daily                             Metformin 500 mg BID                             Ozempic Qweek   Current Orders: Semglee 14 units QHS  Novolog 0-20 units TID ac/hs  Novolog 3 units TID with meals Budenoside 9 mg daily    MD- Please consider:   1. Increase Semglee slightly to 16 units QHS   2. Increase Novolog Meal Coverage to 5 units TID with meals  3. At discharge, will need Metformin discontinued due to history of liver disease (note that there is metformin in Janumet).   If stopping Janumet, could prescribe Januvia by itself.      --Will follow patient during hospitalization--  01/08/2021 RN, MSN, CDE Diabetes Coordinator Inpatient Glycemic Control Team Team Pager: [phone] (8a-5p)

## 2021-01-06 NOTE — Progress Notes (Addendum)
PROGRESS NOTE    [name]  IWP:[id number] DOB: Aug 31, 1975 DOA: 12/17/2020 PCP: Eunice Blase, PA-C    Brief Narrative:   [name] is a 45 y.o. female with medical history significant for Bipolar disorder, diabetes mellitus alcohol use disorder and polysubstance abuse, FSH muscular dystrophy, with history of falls, last seen by neurology in January 2020, who was brought in by EMS was brought into the hospital after sustaining a fall and was unable to get up.  In the ED, patient was noted to have severe hyponatremia with sodium level of 113, potassium of 2.9.  Ammonia was elevated at 52 with bilirubin of 11.9.  Patient did have a right lower lobe infiltrate and received antibiotic with Unasyn and IV fluids.  She was also noted to have hepatic encephalopathy and was admitted to hospital.    Assessment & Plan:   Principal Problem:   Sepsis ([name]) Active Problems:   Alcohol withdrawal ([name])   Bipolar 2 disorder ([name])   Hypokalemia   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[name])   Acute hyponatremia   Alcohol use disorder, moderate, dependence ([name])   Hepatic steatosis   Polysubstance abuse ([name])   Hyperglycemia due to type 2 diabetes mellitus ([name])   Hyperbilirubinemia   Lactic acidosis   Alcoholic liver disease ([name])   Acute hepatic encephalopathy   Pressure injury of skin   Aspiration pneumonia of both lower lobes due to gastric secretions ([name])  Sepsis. Acute hypoxemic respiratory failure secondary to aspiration pneumonia Aspiration pneumonia. Persistent leukocytosis. Patient previously had a completed course of antibiotics with Unasyn in addition to Zyvox. Patient has persistent leukocytosis.  Still has elevation of procalcitonin level, this could be due to liver failure. Repeat chest x-ray showed a right-sided pleural effusion, could be due to ascites. Paracentesis did not show any evidence of peritonitis. Culture from peripheral blood and from central line are  all negative.  No line infection. At this point, I will complete the 5 days of Unasyn, discontinue vancomycin. Patient is still requiring 2 L oxygen, will try to wean off  Alcohol abuse and polydrug abuse. 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 Liver failure secondary to alcoholic liver cirrhosis. Acute hepatic encephalopathy. Patient had paracentesis performed today, due to severe hypoalbuminemia, I will give IV albumin. Appreciate GI consult.  Continue diuretics, will change to oral Lasix in addition to Aldactone 100 mg daily. Patient mental status has improved.  Hyponatremia. Hyperkalemia followed by hypokalemia Hypomagnesemia. Hypophosphatemia. Continue replete potassium and magnesium.  Depression. Patient appears to be depressed, poor motivation.  Appreciate psychiatry consult.  Type 2 diabetes uncontrolled with hyperglycemia. Increase long-acting insulin.  Anemia of chronic disease.   DVT prophylaxis: lovenox Code Status: full Family Communication:  Disposition Plan:      Status is: Inpatient   Remains inpatient appropriate because: Severity of disease, still significant leukocytosis, started treatment for infection.        I/O last 3 completed shifts: In: 1379.4 [P.O.:477; IV Piggyback:902.4] Out: 900 [Urine:900] No intake/output data recorded.   Consultants:  GI   Procedures: None   Antimicrobials: Unasyn      Subjective: Patient still on 2 L oxygen, not short of breath at rest, but short of breath with exertion. She does not have abdominal pain, no nausea vomiting. No fever or chills. No dysuria hematuria  No headache or dizziness. No chest pain palpitation.  Objective: Vitals:   01/06/21 0350 01/06/21 0809 01/06/21 0900 01/06/21 1058  BP: 120/76 107/65 110/75  116/78  Pulse: 94 99 96 92  Resp: 16 20 18 18   Temp: 97.7 F (36.5 C) 98 F (36.7 C)    TempSrc: Oral Oral    SpO2: 97% 95% 98% 97%   Weight: 105.6 kg     Height:        Intake/Output Summary (Last 24 hours) at 01/06/2021 1244 Last data filed at 01/06/2021 0340 Gross per 24 hour  Intake 939.36 ml  Output 900 ml  Net 39.36 ml   Filed Weights   01/03/21 0441 01/05/21 0500 01/06/21 0350  Weight: 101.4 kg 105.3 kg 105.6 kg    Examination:  General exam: Appears calm and comfortable  Respiratory system: Clear to auscultation. Respiratory effort normal. Cardiovascular system: S1 & S2 heard, RRR. No JVD, murmurs, rubs, gallops or clicks. No pedal edema. Gastrointestinal system: Abdomen is distended, soft and nontender. No organomegaly or masses felt. Normal bowel sounds heard. Central nervous system: Alert and oriented x3. No focal neurological deficits. Extremities: Symmetric 5 x 5 power. Skin: Jaundiced Psychiatry: Mood & affect appropriate.     Data Reviewed: I have personally reviewed following labs and imaging studies  CBC: Recent Labs  Lab 01/02/21 0535 01/03/21 0750 01/04/21 0311 01/05/21 0556 01/06/21 0631  WBC 24.4* 23.4* 27.3* 26.1* 26.8*  NEUTROABS  --   --   --  21.9* 22.6*  HGB 8.6* 8.4* 8.5* 8.2* 7.9*  HCT 28.0* 26.4* 27.0* 26.5* 25.6*  MCV 112.9* 113.3* 113.9* 112.8* 112.8*  PLT 153 203 223 214 193   Basic Metabolic Panel: Recent Labs  Lab 12/30/20 1434 12/31/20 0540 01/01/21 0556 01/02/21 0535 01/03/21 0750 01/04/21 0311 01/05/21 0556 01/06/21 0631  NA  --  142   < > 139 138 135 136 136  K  --  3.0*   < > 3.0* 3.2* 3.3* 3.1* 3.2*  CL  --  100   < > 94* 89* 87* 89* 91*  CO2  --  36*   < > 37* 38* 37* 37* 36*  GLUCOSE  --  138*   < > 195* 240* 179* 179* 247*  BUN  --  20   < > 16 18 17 19 19   CREATININE  --  0.52   < > 0.50 0.55 0.62 0.48 0.55  CALCIUM  --  8.3*   < > 7.9* 7.6* 7.5* 7.3* 7.3*  MG 1.4* 1.9  --   --  1.2* 1.6* 1.7 1.5*  PHOS 2.2* 3.6  --   --  2.7 2.9 2.7  --    < > = values in this interval not displayed.   GFR: Estimated Creatinine Clearance: 112.2  mL/min (by C-G formula based on SCr of 0.55 mg/dL). Liver Function Tests: Recent Labs  Lab 01/01/21 0556 01/02/21 0535 01/03/21 0750 01/05/21 0556 01/06/21 0631  AST 183* 178* 174* 161* 158*  ALT 35 34 34 31 30  ALKPHOS 172* 183* 205* 190* 198*  BILITOT 8.8* 9.2* 9.1* 9.9* 9.8*  PROT 6.3* 6.1* 6.1* 6.1* 6.3*  ALBUMIN <1.5* <1.5* <1.5* <1.5* <1.5*   No results for input(s): LIPASE, AMYLASE in the last 168 hours. Recent Labs  Lab 12/30/20 1434 12/31/20 0540 01/01/21 0556 01/02/21 0535  AMMONIA 40* 66* 57* 46*   Coagulation Profile: Recent Labs  Lab 01/03/21 0750  INR 1.1   Cardiac Enzymes: No results for input(s): CKTOTAL, CKMB, CKMBINDEX, TROPONINI in the last 168 hours. BNP (last 3 results) No results for input(s): PROBNP in the last 8760 hours. HbA1C:  No results for input(s): HGBA1C in the last 72 hours. CBG: Recent Labs  Lab 01/05/21 1154 01/05/21 1645 01/05/21 2143 01/06/21 0806 01/06/21 1141  GLUCAP 185* 228* 227* 235* 195*   Lipid Profile: No results for input(s): CHOL, HDL, LDLCALC, TRIG, CHOLHDL, LDLDIRECT in the last 72 hours. Thyroid Function Tests: No results for input(s): TSH, T4TOTAL, FREET4, T3FREE, THYROIDAB in the last 72 hours. Anemia Panel: Recent Labs    01/04/21 0311 01/05/21 0500  FERRITIN  --  368*  TIBC 134*  --   IRON 42  --    Sepsis Labs: Recent Labs  Lab 01/04/21 0311 01/05/21 0556 01/06/21 0631  PROCALCITON 1.77 2.73 2.54    Recent Results (from the past 240 hour(s))  Body fluid culture w Gram Stain     Status: None   Collection Time: 12/30/20  9:45 AM   Specimen: PATH Cytology Peritoneal fluid  Result Value Ref Range Status   Specimen Description   Final    PERITONEAL Performed at [facility], [address], [place], [place] [address]    Special Requests   Final    NONE Performed at [facility], [address] [place], [place] [address]    Gram Stain   Final    WBC PRESENT,BOTH PMN  AND MONONUCLEAR NO ORGANISMS SEEN CYTOSPIN SMEAR    Culture   Final    NO GROWTH 3 DAYS Performed at [facility], [address] [place], [place] [address]    Report Status 01/02/2021 FINAL  Final  Cath Tip Culture     Status: None (Preliminary result)   Collection Time: 01/04/21  1:59 PM   Specimen: Catheter Tip; Other  Result Value Ref Range Status   Specimen Description   Final    CATH TIP Performed at [facility], [address], [place], [place] [address]    Special Requests   Final    NONE Performed at [facility], [address] [place], [place] [address]    Culture   Final    NO GROWTH 2 DAYS Performed at [facility], [address] [place], [place] [address]    Report Status PENDING  Incomplete  Culture, blood (single) w Reflex to ID Panel     Status: None (Preliminary result)   Collection Time: 01/04/21  2:00 PM   Specimen: BLOOD  Result Value Ref Range Status   Specimen Description BLOOD ALIN  Final   Special Requests   Final    BOTTLES DRAWN AEROBIC AND ANAEROBIC Blood Culture results may not be optimal due to an excessive volume of blood received in culture bottles   Culture   Final    NO GROWTH 2 DAYS Performed at [facility], [address], [place], [place] [address]    Report Status PENDING  Incomplete  Culture, blood (single) w Reflex to ID Panel     Status: None (Preliminary result)   Collection Time: 01/04/21  2:35 PM   Specimen: BLOOD  Result Value Ref Range Status   Specimen Description BLOOD LEFT ANTECUBITAL  Final   Special Requests   Final    BOTTLES DRAWN AEROBIC AND ANAEROBIC Blood Culture adequate volume   Culture   Final    NO GROWTH 2 DAYS Performed at [facility], [address] [place], [place] [address]    Report Status PENDING  Incomplete         Radiology Studies: DG Chest 2 View  Result Date: 01/05/2021 CLINICAL DATA:  Aspiration pneumonia. EXAM: CHEST - 2 VIEW  COMPARISON:  January 02, 2021. FINDINGS: Stable cardiomediastinal silhouette. Stable elevated right hemidiaphragm is noted with associated right basilar atelectasis and effusion. Mild left basilar subsegmental atelectasis is noted. Right-sided PICC line is unchanged in position. Bony thorax is unremarkable. IMPRESSION: Stable elevated right hemidiaphragm is noted with associated right basilar atelectasis and effusion. Mild left basilar subsegmental atelectasis is noted. Electronically Signed   By: Lupita Raider M.D.   On: 01/05/2021 17:08        Scheduled Meds:  budesonide  9 mg Oral Daily   feeding supplement  237 mL Oral TID BM   FLUoxetine  80 mg Oral Daily   fluticasone  1 spray Each Nare Daily   folic acid  1 mg Oral Daily   furosemide  40 mg Oral Daily   insulin aspart  0-20 Units Subcutaneous TID WC   insulin aspart  0-5 Units Subcutaneous QHS   insulin aspart  3 Units Subcutaneous TID WC   insulin glargine-yfgn  18 Units Subcutaneous QHS   multivitamin with minerals  1 tablet Oral Daily   OLANZapine  5 mg Oral Daily   pantoprazole  40 mg Oral BID   pregabalin  75 mg Oral TID   rifaximin  550 mg Oral BID   spironolactone  100 mg Oral Daily   thiamine  100 mg Oral Daily   Vitamin D (Ergocalciferol)  50,000 Units Oral Q7 days   Continuous Infusions:  sodium chloride Stopped (12/31/20 0118)   albumin human     ampicillin-sulbactam (UNASYN) IV 200 mL/hr at 01/06/21 0340   potassium chloride 10 mEq (01/06/21 1127)     LOS: 19 days    Time spent: 32 minutes    Marrion Coy, MD [facility]   To contact the attending provider between 7A-7P or the covering provider during after hours 7P-7A, please log into the web site [email or web address] and access using universal [place] password for that web site. If you do not have the password, please call the hospital operator.  01/06/2021, 12:44 PM

## 2021-01-06 NOTE — Procedures (Signed)
Interventional Radiology Procedure:   Indications: Recurrent ascites  Procedure: US guided paracentesis  Findings: Removed 750 ml from right lower quadrant  Complications: None     EBL: Less than 10 ml   Reece Fehnel R. Lowella Dandy, MD  Pager: [phone]

## 2021-01-06 NOTE — Progress Notes (Signed)
Physical Therapy Treatment Patient Details Name: [name] MRN: [id number] DOB: 23-Mar-1975 Today's Date: 01/06/2021   History of Present Illness 45 y.o. female with medical history significant for Bipolar disorder, diabetes, alcohol use disorder and polysubstance abuse, FSH muscular dystrophy, with history of frequent falls, last seen by neurology in January 2020, who was brought in by EMS after she fell at home 2 days prior and was unable to get up.    PT Comments    Pt is making limited progress towards goals, however is willing to work with therapy this date. Pt able to sit at EOB (exit towards R preferred side). Once seated at EOB, able to perform several lateral scoots up towards HOB. Needs assist for bed mobility. Minimal there-ex performed. Will continue to progress as able.  Recommendations for follow up therapy are one component of a multi-disciplinary discharge planning process, led by the attending physician.  Recommendations may be updated based on patient status, additional functional criteria and insurance authorization.  Follow Up Recommendations  SNF;Supervision for mobility/OOB     Equipment Recommendations  Other (comment)    Recommendations for Other Services       Precautions / Restrictions Precautions Precautions: Fall Restrictions Weight Bearing Restrictions: No     Mobility  Bed Mobility Overal bed mobility: Needs Assistance Bed Mobility: Supine to Sit;Sit to Supine Rolling: Min assist   Supine to sit: Min assist Sit to supine: Min assist   General bed mobility comments: able to roll in bed for linen adjustment. Needs min assist for all bed mobility. Once seated at EOB, able to sit with supervision. Declined grooming tasks at bedside    Transfers Overall transfer level: Needs assistance   Transfers: Lateral/Scoot Transfers          Lateral/Scoot Transfers: Max assist General transfer comment: performed lateral scoots towards HOB. max  assist performed with heavy cues for hand placement and weight shifting.Fatigues quickly after 3 small scoots  Ambulation/Gait             General Gait Details: unsafe/unable   Stairs             Wheelchair Mobility    Modified Rankin (Stroke Patients Only)       Balance Overall balance assessment: Needs assistance Sitting-balance support: Feet supported Sitting balance-Leahy Scale: Fair Sitting balance - Comments: keeps B hands for support on bed                                    Cognition Arousal/Alertness: Awake/alert Behavior During Therapy: Anxious Overall Cognitive Status: Within Functional Limits for tasks assessed                                 General Comments: improved motivation to perform tasks this date but continues to be limited in what she is willing to do      Exercises Other Exercises Other Exercises: supine/seated ther-ex performed including hip abd/add, LAQ, SAQ and educated on written HEP. Pt only agreeable to 5 reps. Wishes not to perform HEP on B UEs. Supervision given    General Comments        Pertinent Vitals/Pain Pain Assessment: No/denies pain    Home Living                      Prior Function  PT Goals (current goals can now be found in the care plan section) Acute Rehab PT Goals Patient Stated Goal: to get stronger and stand again PT Goal Formulation: With patient Time For Goal Achievement: 01/18/21 Potential to Achieve Goals: Fair Progress towards PT goals: Progressing toward goals    Frequency    Min 2X/week      PT Plan Current plan remains appropriate    Co-evaluation              AM-PAC PT "6 Clicks" Mobility   Outcome Measure  Help needed turning from your back to your side while in a flat bed without using bedrails?: A Little Help needed moving from lying on your back to sitting on the side of a flat bed without using bedrails?: A  Little Help needed moving to and from a bed to a chair (including a wheelchair)?: A Lot Help needed standing up from a chair using your arms (e.g., wheelchair or bedside chair)?: Total Help needed to walk in hospital room?: Total Help needed climbing 3-5 steps with a railing? : Total 6 Click Score: 11    End of Session Equipment Utilized During Treatment: Oxygen Activity Tolerance: Patient limited by fatigue;Patient limited by pain;Patient tolerated treatment well Patient left: with call bell/phone within reach;in bed;with bed alarm set;with family/visitor present Nurse Communication: Mobility status PT Visit Diagnosis: Other abnormalities of gait and mobility (R26.89);Muscle weakness (generalized) (M62.81);History of falling (Z91.81)     Time: 1410-1433 PT Time Calculation (min) (ACUTE ONLY): 23 min  Charges:  $Therapeutic Exercise: 8-22 mins $Therapeutic Activity: 8-22 mins                     Elizabeth Palau, PT, DPT [phone]    Dontray Haberland 01/06/2021, 3:36 PM

## 2021-01-06 NOTE — Progress Notes (Signed)
Pharmacy Antibiotic Note  [name] is a 45 y.o. female admitted on 12/17/2020 with pneumonia.  Pharmacy has been consulted for vancomycin dosing. Pt also started on 5 day course Unasyn. Pt persistent leukocytosis, PCT 1.57>1.77>2.73. Pt received 10 days MRSA PNA coverage with vanc/linezolid from 10/4-10/11 (had positive MRSA PCR 10/3).  Discussed previous antibiotic course with MD; possible alternative source of infection could be IV line.   Plan: Continue vancomycin 2 g IV q24h Est AUC: 493 Scr used 0.8 (rounded up), IBW, Vd 0.5 Obtain vanc level around 4th or 5th dose if continued Follow up cultures   Height: 5\' 7"  (170.2 cm) Weight: 105.6 kg (232 lb 12.8 oz) IBW/kg (Calculated) : 61.6  Temp (24hrs), Avg:98.5 F (36.9 C), Min:97.7 F (36.5 C), Max:99.6 F (37.6 C)  Recent Labs  Lab 01/02/21 0535 01/03/21 0750 01/04/21 0311 01/05/21 0556 01/06/21 0631  WBC 24.4* 23.4* 27.3* 26.1* 26.8*  CREATININE 0.50 0.55 0.62 0.48 0.55     Estimated Creatinine Clearance: 112.2 mL/min (by C-G formula based on SCr of 0.55 mg/dL).    Allergies  Allergen Reactions   Erythromycin Anaphylaxis   Sulfa Antibiotics Anaphylaxis   Tylenol [Acetaminophen] Other (See Comments)    Pancreatitis and liver dysfunction, not supposed to take med   Nsaids Other (See Comments)    Causes GI problems, very bad reaction-per patient.    Gabapentin     Mood changes     Antimicrobials this admission: Unasyn 10/2-10/4, 10/19 >>  Vancomycin 10/4-10/6, 10/20 >>  Linezolid 10/6 >> 10/11  Dose adjustments this admission:   Microbiology results: 10/2 Bcx: NG 10/19 BCx: NGTD 10/3 MRSA PCR: positive 10/14 Peritoneal fluid: NG 10/19 Cath tip: sent  Thank you for allowing pharmacy to be a part of this patient's care.  11/19 [name] 01/06/2021 7:31 AM

## 2021-01-06 NOTE — Progress Notes (Signed)
Brief Nutrition Note  New MD consult received for malnutrition assessment.  Pt being followed by nutrition services with last full assessment completed 10/17 (see note for details). Nutrition supplements are in place and pt has had stable weight and consistent intake ranging from 50-100% of her meals consumed. Consuming most nutrition supplements.   Albumin low, but likely due to liver disease as pt did not show physical signs of malnutrition on exam. No concerns currently as albumin is not an indicator of nutritional status, but rather an indicator of morbidity and mortality, and recovery from acute and chronic illness.  Will continue to follow patient throughout admission and monitor intake and weight.   Greig Castilla, RD, LDN Clinical Dietitian RD pager # available in AMION  After hours/weekend pager # available in [facility]

## 2021-01-06 NOTE — Progress Notes (Signed)
OT Cancellation Note  Patient Details Name: TAYDEM CAVAGNARO MRN: [id number] DOB: 02/15/1976   Cancelled Treatment:    Reason Eval/Treat Not Completed: Patient at procedure or test/ unavailable. Pt currently off the floor for ultrasound. OT will re-attempt when next available.   Jackquline Denmark, MS, OTR/L , CBIS ascom [phone]  01/06/21, 10:46 AM

## 2021-01-07 DIAGNOSIS — R6520 Severe sepsis without septic shock: Secondary | ICD-10-CM | POA: Diagnosis not present

## 2021-01-07 DIAGNOSIS — K7682 Hepatic encephalopathy: Secondary | ICD-10-CM | POA: Diagnosis not present

## 2021-01-07 DIAGNOSIS — F1020 Alcohol dependence, uncomplicated: Secondary | ICD-10-CM | POA: Diagnosis not present

## 2021-01-07 DIAGNOSIS — A419 Sepsis, unspecified organism: Secondary | ICD-10-CM | POA: Diagnosis not present

## 2021-01-07 LAB — BASIC METABOLIC PANEL
Anion gap: 11 (ref 5–15)
BUN: 18 mg/dL (ref 6–20)
CO2: 34 mmol/L — ABNORMAL HIGH (ref 22–32)
Calcium: 7.4 mg/dL — ABNORMAL LOW (ref 8.9–10.3)
Chloride: 90 mmol/L — ABNORMAL LOW (ref 98–111)
Creatinine, Ser: 0.54 mg/dL (ref 0.44–1.00)
GFR, Estimated: >60 mL/min (ref >60)
Glucose, Bld: 254 mg/dL — ABNORMAL HIGH (ref 70–99)
Potassium: 3.3 mmol/L — ABNORMAL LOW (ref 3.5–5.1)
Sodium: 135 mmol/L (ref 135–145)

## 2021-01-07 LAB — CBC WITH DIFFERENTIAL/PLATELET
Abs Immature Granulocytes: 0.24 10*3/uL — ABNORMAL HIGH (ref 0.00–0.07)
Basophils Absolute: 0.1 10*3/uL (ref 0.0–0.1)
Basophils Relative: 0 %
Eosinophils Absolute: 0.2 10*3/uL (ref 0.0–0.5)
Eosinophils Relative: 1 %
HCT: 26.3 % — ABNORMAL LOW (ref 36.0–46.0)
Hemoglobin: 8.2 g/dL — ABNORMAL LOW (ref 12.0–15.0)
Immature Granulocytes: 1 %
Lymphocytes Relative: 9 %
Lymphs Abs: 2.5 10*3/uL (ref 0.7–4.0)
MCH: 35.8 pg — ABNORMAL HIGH (ref 26.0–34.0)
MCHC: 31.2 g/dL (ref 30.0–36.0)
MCV: 114.8 fL — ABNORMAL HIGH (ref 80.0–100.0)
Monocytes Absolute: 1 10*3/uL (ref 0.1–1.0)
Monocytes Relative: 4 %
Neutro Abs: 24.5 10*3/uL — ABNORMAL HIGH (ref 1.7–7.7)
Neutrophils Relative %: 85 %
Platelets: 186 10*3/uL (ref 150–400)
RBC: 2.29 MIL/uL — ABNORMAL LOW (ref 3.87–5.11)
RDW: 20.2 % — ABNORMAL HIGH (ref 11.5–15.5)
Smear Review: NORMAL
WBC: 28.4 10*3/uL — ABNORMAL HIGH (ref 4.0–10.5)
nRBC: 0.1 % (ref 0.0–0.2)

## 2021-01-07 LAB — GLUCOSE, CAPILLARY
Glucose-Capillary: 191 mg/dL — ABNORMAL HIGH (ref 70–99)
Glucose-Capillary: 312 mg/dL — ABNORMAL HIGH (ref 70–99)
Glucose-Capillary: 344 mg/dL — ABNORMAL HIGH (ref 70–99)
Glucose-Capillary: 234 mg/dL — ABNORMAL HIGH (ref 70–99)

## 2021-01-07 LAB — CATH TIP CULTURE: Culture: NO GROWTH

## 2021-01-07 LAB — MAGNESIUM: Magnesium: 1.8 mg/dL (ref 1.7–2.4)

## 2021-01-07 MED ORDER — POTASSIUM CHLORIDE 10 MEQ/100ML IV SOLN
10.0000 meq | INTRAVENOUS | Status: AC
Start: 2021-01-07 — End: 2021-01-07
  Administered 2021-01-07 (×4): 10 meq via INTRAVENOUS
  Filled 2021-01-07 (×4): qty 100

## 2021-01-07 MED ORDER — VANCOMYCIN HCL 125 MG PO CAPS
125.0000 mg | ORAL_CAPSULE | Freq: Four times a day (QID) | ORAL | Status: DC
  Administered 2021-01-07 – 2021-01-09 (×9): 125 mg via ORAL
  Filled 2021-01-07 (×10): qty 1

## 2021-01-07 MED ORDER — INSULIN GLARGINE-YFGN 100 UNIT/ML ~~LOC~~ SOLN
26.0000 [IU] | Freq: Every day | SUBCUTANEOUS | Status: DC
  Administered 2021-01-07 – 2021-01-11 (×5): 26 [IU] via SUBCUTANEOUS
  Filled 2021-01-07 (×6): qty 0.26

## 2021-01-07 MED ORDER — INSULIN ASPART 100 UNIT/ML IJ SOLN
6.0000 [IU] | Freq: Three times a day (TID) | INTRAMUSCULAR | Status: DC
  Administered 2021-01-07 – 2021-01-13 (×21): 6 [IU] via SUBCUTANEOUS
  Filled 2021-01-07 (×20): qty 1

## 2021-01-07 NOTE — Progress Notes (Signed)
Received VAST consult to start PIV. This patient has poor veins and PIV's not lasting therefore patient has tunneled DL CVC. Secure chat with Dr. Chipper Herb. Agreeable to leave CVC in place.

## 2021-01-07 NOTE — Progress Notes (Addendum)
PROGRESS NOTE    [name]  MWN:[id number] DOB: 01/12/1976 DOA: 12/17/2020 PCP: Eunice Blase, PA-C    Brief Narrative:  [name] is a 45 y.o. female with medical history significant for Bipolar disorder, diabetes mellitus alcohol use disorder and polysubstance abuse, FSH muscular dystrophy, with history of falls, last seen by neurology in January 2020, who was brought in by EMS was brought into the hospital after sustaining a fall and was unable to get up.  In the ED, patient was noted to have severe hyponatremia with sodium level of 113, potassium of 2.9.  Ammonia was elevated at 52 with bilirubin of 11.9.  Patient did have a right lower lobe infiltrate and received antibiotic with Unasyn and IV fluids.  She was also noted to have hepatic encephalopathy and was admitted to hospital.     Assessment & Plan:   Principal Problem:   Sepsis ([name]) Active Problems:   Alcohol withdrawal ([name])   Bipolar 2 disorder ([name])   Hypokalemia   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[name])   Acute hyponatremia   Alcohol use disorder, moderate, dependence ([name])   Hepatic steatosis   Polysubstance abuse ([name])   Hyperglycemia due to type 2 diabetes mellitus ([name])   Hyperbilirubinemia   Lactic acidosis   Alcoholic liver disease ([name])   Acute hepatic encephalopathy   Pressure injury of skin   Aspiration pneumonia of both lower lobes due to gastric secretions ([name])  Sepsis. Acute hypoxemic respiratory failure secondary to aspiration pneumonia Aspiration pneumonia. Persistent leukocytosis. Patient still has significant leukocytosis, actually getting worse.  Chest x-ray did not show pneumonia, patient does not have urinary symptoms.  Paracentesis did not show any evidence of peritonitis. Central line and a peripheral blood cultures were negative. Spoke with Dr. Earlene Plater, no need for antibiotic this time. However, patient is still have significant diarrhea after discontinuation of the  lactulose 3 times ago, she can potentially has a C diff colitis.  Will check C. difficile toxin. No need to remove the central line at this time. I have sent a secure chat message to Dr. Enedina Finner to order C. difficile sent, computer does not accept my and Dr. Earlene Plater order.  Alcohol abuse and polydrug abuse. 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 Liver failure secondary to alcoholic liver cirrhosis. Acute hepatic encephalopathy. Condition stable, lactulose changed to as needed, continue rifaximin. Patient is status post paracentesis yesterday, no evidence of peritonitis. Continue oral furosemide and Aldactone.  Hyponatremia. Hyperkalemia followed by hypokalemia Hypomagnesemia. Hypophosphatemia. Continue replete potassium.  Follow electrolytes.  Depression. Patient appears to be depressed, poor motivation.  Appreciate psychiatry consult.  Type 2 diabetes uncontrolled with hyperglycemia. Increase long-acting insulin and scheduled short acting insulin.  Anemia of chronic disease.  Addendum:  I had to send a paper order for c diff as I was unsuccessful to get any authorized personal to order the test.  Due to severity of leukocytosis and high suspicion of c diff, I will also start vancomycin.    DVT prophylaxis: lovenox Code Status: full Family Communication:  Disposition Plan:      Status is: Inpatient   Remains inpatient appropriate because: Severity of disease, and unsafe discharge      I/O last 3 completed shifts: In: 1472.7 [P.O.:477; IV Piggyback:995.7] Out: 150 [Urine:150] No intake/output data recorded.    Consultants:  GI   Procedures: None   Antimicrobials: None    Subjective: Patient still complains of loose stool, lactulose was changed to as  needed 3 days ago, patient has not been taking it.  She does not have abdominal pain or nausea vomiting. She is still on 3 L oxygen, does not feel short of breath.   She has a cough, nonproductive. No fever or chills. No dysuria hematuria    Objective: Vitals:   01/06/21 1638 01/06/21 2209 01/07/21 0455 01/07/21 0800  BP: 113/66 96/61 109/70 112/73  Pulse: (!) 103 91 91 (!) 102  Resp: 18 20 20 18   Temp: 98.2 F (36.8 C) 98.6 F (37 C) 97.9 F (36.6 C) 98.6 F (37 C)  TempSrc: Oral Oral Oral Oral  SpO2: 93% 97% 97% 100%  Weight:   106.4 kg   Height:        Intake/Output Summary (Last 24 hours) at 01/07/2021 1044 Last data filed at 01/07/2021 0600 Gross per 24 hour  Intake 1034.91 ml  Output 150 ml  Net 884.91 ml   Filed Weights   01/05/21 0500 01/06/21 0350 01/07/21 0455  Weight: 105.3 kg 105.6 kg 106.4 kg    Examination:  General exam: Appears calm and comfortable  Respiratory system: Clear to auscultation. Respiratory effort normal. Cardiovascular system: S1 & S2 heard, RRR. No JVD, murmurs, rubs, gallops or clicks. No pedal edema. Gastrointestinal system: Abdomen is distended, soft and nontender. No organomegaly or masses felt. Normal bowel sounds heard. Central nervous system: Alert and oriented x3. No focal neurological deficits. Extremities: Symmetric 5 x 5 power. Skin: jaundice. Psychiatry: Judgement and insight appear normal. Mood & affect appropriate.     Data Reviewed: I have personally reviewed following labs and imaging studies  CBC: Recent Labs  Lab 01/03/21 0750 01/04/21 0311 01/05/21 0556 01/06/21 0631 01/07/21 0449  WBC 23.4* 27.3* 26.1* 26.8* 28.4*  NEUTROABS  --   --  21.9* 22.6* 24.5*  HGB 8.4* 8.5* 8.2* 7.9* 8.2*  HCT 26.4* 27.0* 26.5* 25.6* 26.3*  MCV 113.3* 113.9* 112.8* 112.8* 114.8*  PLT 203 223 214 193 186   Basic Metabolic Panel: Recent Labs  Lab 01/03/21 0750 01/04/21 0311 01/05/21 0556 01/06/21 0631 01/07/21 0449  NA 138 135 136 136 135  K 3.2* 3.3* 3.1* 3.2* 3.3*  CL 89* 87* 89* 91* 90*  CO2 38* 37* 37* 36* 34*  GLUCOSE 240* 179* 179* 247* 254*  BUN 18 17 19 19 18    CREATININE 0.55 0.62 0.48 0.55 0.54  CALCIUM 7.6* 7.5* 7.3* 7.3* 7.4*  MG 1.2* 1.6* 1.7 1.5* 1.8  PHOS 2.7 2.9 2.7  --   --    GFR: Estimated Creatinine Clearance: 112.6 mL/min (by C-G formula based on SCr of 0.54 mg/dL). Liver Function Tests: Recent Labs  Lab 01/01/21 0556 01/02/21 0535 01/03/21 0750 01/05/21 0556 01/06/21 0631  AST 183* 178* 174* 161* 158*  ALT 35 34 34 31 30  ALKPHOS 172* 183* 205* 190* 198*  BILITOT 8.8* 9.2* 9.1* 9.9* 9.8*  PROT 6.3* 6.1* 6.1* 6.1* 6.3*  ALBUMIN <1.5* <1.5* <1.5* <1.5* <1.5*   No results for input(s): LIPASE, AMYLASE in the last 168 hours. Recent Labs  Lab 01/01/21 0556 01/02/21 0535  AMMONIA 57* 46*   Coagulation Profile: Recent Labs  Lab 01/03/21 0750  INR 1.1   Cardiac Enzymes: No results for input(s): CKTOTAL, CKMB, CKMBINDEX, TROPONINI in the last 168 hours. BNP (last 3 results) No results for input(s): PROBNP in the last 8760 hours. HbA1C: No results for input(s): HGBA1C in the last 72 hours. CBG: Recent Labs  Lab 01/06/21 0806 01/06/21 1141 01/06/21 1635 01/06/21  2152 01/07/21 0801  GLUCAP 235* 195* 316* 248* 191*   Lipid Profile: No results for input(s): CHOL, HDL, LDLCALC, TRIG, CHOLHDL, LDLDIRECT in the last 72 hours. Thyroid Function Tests: No results for input(s): TSH, T4TOTAL, FREET4, T3FREE, THYROIDAB in the last 72 hours. Anemia Panel: Recent Labs    01/05/21 0500  FERRITIN 368*   Sepsis Labs: Recent Labs  Lab 01/04/21 0311 01/05/21 0556 01/06/21 0631  PROCALCITON 1.77 2.73 2.54    Recent Results (from the past 240 hour(s))  Body fluid culture w Gram Stain     Status: None   Collection Time: 12/30/20  9:45 AM   Specimen: PATH Cytology Peritoneal fluid  Result Value Ref Range Status   Specimen Description   Final    PERITONEAL Performed at [facility], [address] [place], [place] [address]    Special Requests   Final    NONE Performed at [facility], [address] [place], [place] [address]    Gram Stain   Final    WBC PRESENT,BOTH PMN AND MONONUCLEAR NO ORGANISMS SEEN CYTOSPIN SMEAR    Culture   Final    NO GROWTH 3 DAYS Performed at [facility], [address] [place], [place] [address]    Report Status 01/02/2021 FINAL  Final  Cath Tip Culture     Status: None (Preliminary result)   Collection Time: 01/04/21  1:59 PM   Specimen: Catheter Tip; Other  Result Value Ref Range Status   Specimen Description   Final    CATH TIP Performed at [facility], [address], [place], [place] [address]    Special Requests   Final    NONE Performed at [facility], [address], [place], [place] [address]    Culture   Final    NO GROWTH 2 DAYS Performed at [facility], [address], [place], [place] [address]    Report Status PENDING  Incomplete  Culture, blood (single) w Reflex to ID Panel     Status: None (Preliminary result)   Collection Time: 01/04/21  2:00 PM   Specimen: BLOOD  Result Value Ref Range Status   Specimen Description   Final    BLOOD ALIN Performed at [facility], [address] [place], [place] [address]    Special Requests   Final    BOTTLES DRAWN AEROBIC AND ANAEROBIC Blood Culture results may not be optimal due to an excessive volume of blood received in culture bottles Performed at [facility], [address] [place], [place] [address]    Culture   Final    NO GROWTH 3 DAYS Performed at [facility], [address] [place], [place] [address]    Report Status PENDING  Incomplete  Culture, blood (single) w Reflex to ID Panel     Status: None (Preliminary result)   Collection Time: 01/04/21  2:35 PM   Specimen: BLOOD  Result Value Ref Range Status   Specimen Description BLOOD LEFT ANTECUBITAL  Final   Special Requests   Final    BOTTLES DRAWN AEROBIC AND ANAEROBIC Blood Culture adequate volume   Culture   Final    NO GROWTH 2  DAYS Performed at [facility], [address] [place], [place] [address]    Report Status PENDING  Incomplete  Body fluid culture w Gram Stain     Status: None (Preliminary result)   Collection Time: 01/06/21 10:55 AM  Specimen: PATH Cytology Peritoneal fluid  Result Value Ref Range Status   Specimen Description   Final    PERITONEAL Performed at [facility], [address] [place], [place] [address]    Special Requests   Final    NONE Performed at [facility], [address] [place], [place] [address]    Gram Stain   Final    FEW WBC PRESENT, PREDOMINANTLY MONONUCLEAR NO ORGANISMS SEEN    Culture   Final    NO GROWTH < 24 HOURS Performed at [facility], [address], [place], [place] [address]    Report Status PENDING  Incomplete         Radiology Studies: DG Chest 2 View  Result Date: 01/05/2021 CLINICAL DATA:  Aspiration pneumonia. EXAM: CHEST - 2 VIEW COMPARISON:  January 02, 2021. FINDINGS: Stable cardiomediastinal silhouette. Stable elevated right hemidiaphragm is noted with associated right basilar atelectasis and effusion. Mild left basilar subsegmental atelectasis is noted. Right-sided PICC line is unchanged in position. Bony thorax is unremarkable. IMPRESSION: Stable elevated right hemidiaphragm is noted with associated right basilar atelectasis and effusion. Mild left basilar subsegmental atelectasis is noted. Electronically Signed   By: Lupita Raider M.D.   On: 01/05/2021 17:08   US Paracentesis  Result Date: 01/06/2021 INDICATION: Ascites due to alcoholic hepatitis. EXAM: ULTRASOUND GUIDED  PARACENTESIS MEDICATIONS: None. COMPLICATIONS: None immediate. PROCEDURE: Informed written consent was obtained from the patient after a discussion of the risks, benefits and alternatives to treatment. A timeout was performed prior to the initiation of the procedure. Initial ultrasound scanning demonstrates a moderate amount of  ascites within the right lower abdominal quadrant. The right lower abdomen was prepped and draped in the usual sterile fashion. 1% lidocaine was used for local anesthesia. Following this, a 19 gauge, 10-cm, Yueh catheter was introduced. An ultrasound image was saved for documentation purposes. The paracentesis was performed. The catheter was removed and a dressing was applied. The patient tolerated the procedure well without immediate post procedural complication. FINDINGS: A total of approximately 750 mL of yellow fluid was removed. Samples were sent to the laboratory as requested by the clinical team. IMPRESSION: Successful ultrasound-guided paracentesis yielding 750 mL of peritoneal fluid. Electronically Signed   By: Richarda Overlie M.D.   On: 01/06/2021 13:15        Scheduled Meds:  budesonide  9 mg Oral Daily   feeding supplement  237 mL Oral TID BM   FLUoxetine  80 mg Oral Daily   fluticasone  1 spray Each Nare Daily   folic acid  1 mg Oral Daily   furosemide  40 mg Oral Daily   insulin aspart  0-20 Units Subcutaneous TID WC   insulin aspart  0-5 Units Subcutaneous QHS   insulin aspart  6 Units Subcutaneous TID WC   insulin glargine-yfgn  26 Units Subcutaneous QHS   multivitamin with minerals  1 tablet Oral Daily   OLANZapine  5 mg Oral Daily   pantoprazole  40 mg Oral BID   pregabalin  75 mg Oral TID   rifaximin  550 mg Oral BID   spironolactone  100 mg Oral Daily   thiamine  100 mg Oral Daily   Vitamin D (Ergocalciferol)  50,000 Units Oral Q7 days   Continuous Infusions:  sodium chloride Stopped (12/31/20 0118)   potassium chloride 10 mEq (01/07/21 0848)     LOS: 20 days    Time spent: 45 minutes, 50% time involved  in direct patient care.    Marrion Coy, MD [facility]   To contact the attending provider between 7A-7P or the covering provider during after hours 7P-7A, please log into the web site [email or web address] and access using universal [place] password for  that web site. If you do not have the password, please call the hospital operator.  01/07/2021, 10:44 AM

## 2021-01-08 DIAGNOSIS — K760 Fatty (change of) liver, not elsewhere classified: Secondary | ICD-10-CM | POA: Diagnosis not present

## 2021-01-08 DIAGNOSIS — R6520 Severe sepsis without septic shock: Secondary | ICD-10-CM | POA: Diagnosis not present

## 2021-01-08 DIAGNOSIS — A419 Sepsis, unspecified organism: Secondary | ICD-10-CM | POA: Diagnosis not present

## 2021-01-08 DIAGNOSIS — J690 Pneumonitis due to inhalation of food and vomit: Secondary | ICD-10-CM | POA: Diagnosis not present

## 2021-01-08 LAB — GLUCOSE, CAPILLARY
Glucose-Capillary: 216 mg/dL — ABNORMAL HIGH (ref 70–99)
Glucose-Capillary: 273 mg/dL — ABNORMAL HIGH (ref 70–99)
Glucose-Capillary: 225 mg/dL — ABNORMAL HIGH (ref 70–99)
Glucose-Capillary: 239 mg/dL — ABNORMAL HIGH (ref 70–99)

## 2021-01-08 LAB — COMPREHENSIVE METABOLIC PANEL
ALT: 28 U/L (ref 0–44)
AST: 154 U/L — ABNORMAL HIGH (ref 15–41)
Albumin: 1.5 g/dL — ABNORMAL LOW (ref 3.5–5.0)
Alkaline Phosphatase: 201 U/L — ABNORMAL HIGH (ref 38–126)
Anion gap: 10 (ref 5–15)
BUN: 17 mg/dL (ref 6–20)
CO2: 35 mmol/L — ABNORMAL HIGH (ref 22–32)
Calcium: 7.8 mg/dL — ABNORMAL LOW (ref 8.9–10.3)
Chloride: 88 mmol/L — ABNORMAL LOW (ref 98–111)
Creatinine, Ser: 0.53 mg/dL (ref 0.44–1.00)
GFR, Estimated: >60 mL/min (ref >60)
Glucose, Bld: 225 mg/dL — ABNORMAL HIGH (ref 70–99)
Potassium: 4 mmol/L (ref 3.5–5.1)
Sodium: 133 mmol/L — ABNORMAL LOW (ref 135–145)
Total Bilirubin: 9.8 mg/dL — ABNORMAL HIGH (ref 0.3–1.2)
Total Protein: 6.5 g/dL (ref 6.5–8.1)

## 2021-01-08 LAB — CBC WITH DIFFERENTIAL/PLATELET
Abs Immature Granulocytes: 0.42 10*3/uL — ABNORMAL HIGH (ref 0.00–0.07)
Basophils Absolute: 0.1 10*3/uL (ref 0.0–0.1)
Basophils Relative: 0 %
Eosinophils Absolute: 0.3 10*3/uL (ref 0.0–0.5)
Eosinophils Relative: 1 %
HCT: 25.9 % — ABNORMAL LOW (ref 36.0–46.0)
Hemoglobin: 8 g/dL — ABNORMAL LOW (ref 12.0–15.0)
Immature Granulocytes: 1 %
Lymphocytes Relative: 8 %
Lymphs Abs: 2.7 10*3/uL (ref 0.7–4.0)
MCH: 34.9 pg — ABNORMAL HIGH (ref 26.0–34.0)
MCHC: 30.9 g/dL (ref 30.0–36.0)
MCV: 113.1 fL — ABNORMAL HIGH (ref 80.0–100.0)
Monocytes Absolute: 1.3 10*3/uL — ABNORMAL HIGH (ref 0.1–1.0)
Monocytes Relative: 4 %
Neutro Abs: 28.1 10*3/uL — ABNORMAL HIGH (ref 1.7–7.7)
Neutrophils Relative %: 86 %
Platelets: 250 10*3/uL (ref 150–400)
RBC: 2.29 MIL/uL — ABNORMAL LOW (ref 3.87–5.11)
RDW: 19.6 % — ABNORMAL HIGH (ref 11.5–15.5)
WBC: 32.8 10*3/uL — ABNORMAL HIGH (ref 4.0–10.5)
nRBC: 0.1 % (ref 0.0–0.2)

## 2021-01-08 LAB — PHOSPHORUS: Phosphorus: 3 mg/dL (ref 2.5–4.6)

## 2021-01-08 LAB — MAGNESIUM: Magnesium: 1.6 mg/dL — ABNORMAL LOW (ref 1.7–2.4)

## 2021-01-08 MED ORDER — MAGNESIUM SULFATE 2 GM/50ML IV SOLN
2.0000 g | Freq: Once | INTRAVENOUS | Status: AC
  Administered 2021-01-08: 2 g via INTRAVENOUS
  Filled 2021-01-08: qty 50

## 2021-01-08 NOTE — Progress Notes (Signed)
PROGRESS NOTE    [name]  WUX:[id number] DOB: 08-Oct-1975 DOA: 12/17/2020 PCP: Eunice Blase, PA-C    Brief Narrative:  [name] is a 45 y.o. female with medical history significant for Bipolar disorder, diabetes mellitus alcohol use disorder and polysubstance abuse, FSH muscular dystrophy, with history of falls, last seen by neurology in January 2020, who was brought in by EMS was brought into the hospital after sustaining a fall and was unable to get up.  In the ED, patient was noted to have severe hyponatremia with sodium level of 113, potassium of 2.9.  Ammonia was elevated at 52 with bilirubin of 11.9.  Patient did have a right lower lobe infiltrate and received antibiotic with Unasyn and IV fluids.  She was also noted to have hepatic encephalopathy and was admitted to hospital.  Patient initially completed the whole course of Unasyn and Zyvox.   Assessment & Plan:   Principal Problem:   Sepsis ([name]) Active Problems:   Alcohol withdrawal ([name])   Bipolar 2 disorder ([name])   Hypokalemia   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[name])   Acute hyponatremia   Alcohol use disorder, moderate, dependence ([name])   Hepatic steatosis   Polysubstance abuse ([name])   Hyperglycemia due to type 2 diabetes mellitus ([name])   Hyperbilirubinemia   Lactic acidosis   Alcoholic liver disease ([name])   Acute hepatic encephalopathy   Pressure injury of skin   Aspiration pneumonia of both lower lobes due to gastric secretions ([name])  Sepsis. Acute hypoxemic respiratory failure secondary to aspiration pneumonia Aspiration pneumonia. Persistent leukocytosis. Patient appears to have worsening leukocytosis on daily basis.  However, not able to identify the source of infection.  After discussion with Dr. Earlene Plater, antibiotics were discontinued, will monitor. Patient also had a persistent diarrhea after lactulose was discontinued.  Not able to place a C. difficile toxin test.  As result, I opted to  treated with oral vancomycin.  Currently, patient diarrhea seem to be resolved since yesterday.  I will continue vancomycin for now on, I will get a formal consult from ID tomorrow. I recheck a CBC tomorrow, if leukocytosis still get worse, we may not have the option but remove the tunneled central line.  Alcohol abuse and polydrug abuse. 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 Liver failure secondary to alcoholic liver cirrhosis. Acute hepatic encephalopathy. Severe hypoalbuminemia secondary to liver cirrhosis. Patient evaluated by GI, diuretics was continued, since I started oral vancomycin, rifaximin was discontinued.  Continue diuretics with furosemide and Aldactone. Patient is status post paracentesis, looks like ascites is reaccumulating.  May need additional paracentesis soon.   Hyponatremia. Hyperkalemia followed by hypokalemia Hypomagnesemia. Hypophosphatemia. Continue to follow, replete potassium magnesium as needed.  Type 2 diabetes uncontrolled with hyperglycemia  Insulin dose increased yesterday, continue to follow   DVT prophylaxis: lovenox Code Status: full Family Communication:  Disposition Plan:      Status is: Inpatient   Remains inpatient appropriate because: Severity of disease, and unsafe discharge       I/O last 3 completed shifts: In: 591.9 [P.O.:240; IV Piggyback:351.9] Out: 1550 [Urine:1550] No intake/output data recorded.    Consultants:  GI   Procedures: None   Antimicrobials: Vancomycin oral    Subjective: Patient is still on 3 L oxygen, but does not feel short of breath.  She still has jaundice. She feels like her her abdominal distention is coming back, no abdominal pain or nausea vomiting.  She had a large bowel movement, but  no diarrhea. No fever or chills. No dysuria hematuria.  Objective: Vitals:   01/07/21 1946 01/08/21 0355 01/08/21 0357 01/08/21 0832  BP: 107/64 103/65  123/75   Pulse: (!) 110 (!) 104  (!) 107  Resp: 18 20  16   Temp: 97.6 F (36.4 C) 98.3 F (36.8 C)  97.9 F (36.6 C)  TempSrc: Oral Oral  Oral  SpO2: 96% 99%  97%  Weight:   108.3 kg   Height:        Intake/Output Summary (Last 24 hours) at 01/08/2021 1215 Last data filed at 01/08/2021 0324 Gross per 24 hour  Intake --  Output 1400 ml  Net -1400 ml   Filed Weights   01/06/21 0350 01/07/21 0455 01/08/21 0357  Weight: 105.6 kg 106.4 kg 108.3 kg    Examination:  General exam: Appears calm and comfortable  Respiratory system: Clear to auscultation. Respiratory effort normal. Cardiovascular system: S1 & S2 heard, RRR. No JVD, murmurs, rubs, gallops or clicks. No pedal edema. Gastrointestinal system: Abdomen is distended, soft and nontender. No organomegaly or masses felt. Normal bowel sounds heard. Central nervous system: Alert and oriented. No focal neurological deficits. Extremities: Symmetric 5 x 5 power. Skin: Jaundice Psychiatry: Mood & affect appropriate.     Data Reviewed: I have personally reviewed following labs and imaging studies  CBC: Recent Labs  Lab 01/04/21 0311 01/05/21 0556 01/06/21 0631 01/07/21 0449 01/08/21 0442  WBC 27.3* 26.1* 26.8* 28.4* 32.8*  NEUTROABS  --  21.9* 22.6* 24.5* 28.1*  HGB 8.5* 8.2* 7.9* 8.2* 8.0*  HCT 27.0* 26.5* 25.6* 26.3* 25.9*  MCV 113.9* 112.8* 112.8* 114.8* 113.1*  PLT 223 214 193 186 250   Basic Metabolic Panel: Recent Labs  Lab 01/03/21 0750 01/04/21 0311 01/05/21 0556 01/06/21 0631 01/07/21 0449 01/08/21 0442  NA 138 135 136 136 135 133*  K 3.2* 3.3* 3.1* 3.2* 3.3* 4.0  CL 89* 87* 89* 91* 90* 88*  CO2 38* 37* 37* 36* 34* 35*  GLUCOSE 240* 179* 179* 247* 254* 225*  BUN 18 17 19 19 18 17   CREATININE 0.55 0.62 0.48 0.55 0.54 0.53  CALCIUM 7.6* 7.5* 7.3* 7.3* 7.4* 7.8*  MG 1.2* 1.6* 1.7 1.5* 1.8 1.6*  PHOS 2.7 2.9 2.7  --   --  3.0   GFR: Estimated Creatinine Clearance: 113.8 mL/min (by C-G formula based on SCr  of 0.53 mg/dL). Liver Function Tests: Recent Labs  Lab 01/02/21 0535 01/03/21 0750 01/05/21 0556 01/06/21 0631 01/08/21 0442  AST 178* 174* 161* 158* 154*  ALT 34 34 31 30 28   ALKPHOS 183* 205* 190* 198* 201*  BILITOT 9.2* 9.1* 9.9* 9.8* 9.8*  PROT 6.1* 6.1* 6.1* 6.3* 6.5  ALBUMIN <1.5* <1.5* <1.5* <1.5* 1.5*   No results for input(s): LIPASE, AMYLASE in the last 168 hours. Recent Labs  Lab 01/02/21 0535  AMMONIA 46*   Coagulation Profile: Recent Labs  Lab 01/03/21 0750  INR 1.1   Cardiac Enzymes: No results for input(s): CKTOTAL, CKMB, CKMBINDEX, TROPONINI in the last 168 hours. BNP (last 3 results) No results for input(s): PROBNP in the last 8760 hours. HbA1C: No results for input(s): HGBA1C in the last 72 hours. CBG: Recent Labs  Lab 01/07/21 1131 01/07/21 1711 01/07/21 2102 01/08/21 0833 01/08/21 1202  GLUCAP 312* 344* 234* 216* 273*   Lipid Profile: No results for input(s): CHOL, HDL, LDLCALC, TRIG, CHOLHDL, LDLDIRECT in the last 72 hours. Thyroid Function Tests: No results for input(s): TSH, T4TOTAL, FREET4, T3FREE, THYROIDAB  in the last 72 hours. Anemia Panel: No results for input(s): VITAMINB12, FOLATE, FERRITIN, TIBC, IRON, RETICCTPCT in the last 72 hours. Sepsis Labs: Recent Labs  Lab 01/04/21 0311 01/05/21 0556 01/06/21 0631  PROCALCITON 1.77 2.73 2.54    Recent Results (from the past 240 hour(s))  Body fluid culture w Gram Stain     Status: None   Collection Time: 12/30/20  9:45 AM   Specimen: PATH Cytology Peritoneal fluid  Result Value Ref Range Status   Specimen Description   Final    PERITONEAL Performed at [facility], [address], [place], [place] [address]    Special Requests   Final    NONE Performed at [facility], [address] [place], [place] [address]    Gram Stain   Final    WBC PRESENT,BOTH PMN AND MONONUCLEAR NO ORGANISMS SEEN CYTOSPIN SMEAR    Culture   Final    NO GROWTH 3  DAYS Performed at [facility], [address] [place], [place] [address]    Report Status 01/02/2021 FINAL  Final  Cath Tip Culture     Status: None   Collection Time: 01/04/21  1:59 PM   Specimen: Catheter Tip; Other  Result Value Ref Range Status   Specimen Description   Final    CATH TIP Performed at [facility], [address], [place], [place] [address]    Special Requests   Final    NONE Performed at [facility], [address], [place], [place] [address]    Culture   Final    NO GROWTH 2 DAYS Performed at [facility], [address] [place], [place] [address]    Report Status 01/07/2021 FINAL  Final  Culture, blood (single) w Reflex to ID Panel     Status: None (Preliminary result)   Collection Time: 01/04/21  2:00 PM   Specimen: BLOOD  Result Value Ref Range Status   Specimen Description BLOOD ALIN  Final   Special Requests   Final    BOTTLES DRAWN AEROBIC AND ANAEROBIC Blood Culture results may not be optimal due to an excessive volume of blood received in culture bottles   Culture   Final    NO GROWTH 4 DAYS Performed at [facility], [address], [place], [place] [address]    Report Status PENDING  Incomplete  Culture, blood (single) w Reflex to ID Panel     Status: None (Preliminary result)   Collection Time: 01/04/21  2:35 PM   Specimen: BLOOD  Result Value Ref Range Status   Specimen Description BLOOD LEFT ANTECUBITAL  Final   Special Requests   Final    BOTTLES DRAWN AEROBIC AND ANAEROBIC Blood Culture adequate volume   Culture   Final    NO GROWTH 4 DAYS Performed at [facility], [address] [place], [place] [address]    Report Status PENDING  Incomplete  Body fluid culture w Gram Stain     Status: None (Preliminary result)   Collection Time: 01/06/21 10:55 AM   Specimen: PATH Cytology Peritoneal fluid  Result Value Ref Range Status   Specimen Description   Final     PERITONEAL Performed at [facility], [address], [place], [place] [address]    Special Requests   Final    NONE Performed at [facility], [address] [place], [place] [address]    Gram Stain   Final  FEW WBC PRESENT, PREDOMINANTLY MONONUCLEAR NO ORGANISMS SEEN    Culture   Final    NO GROWTH 2 DAYS Performed at [facility], [address] [place], [place] [address]    Report Status PENDING  Incomplete         Radiology Studies: No results found.      Scheduled Meds:  budesonide  9 mg Oral Daily   feeding supplement  237 mL Oral TID BM   FLUoxetine  80 mg Oral Daily   fluticasone  1 spray Each Nare Daily   folic acid  1 mg Oral Daily   furosemide  40 mg Oral Daily   insulin aspart  0-20 Units Subcutaneous TID WC   insulin aspart  0-5 Units Subcutaneous QHS   insulin aspart  6 Units Subcutaneous TID WC   insulin glargine-yfgn  26 Units Subcutaneous QHS   multivitamin with minerals  1 tablet Oral Daily   OLANZapine  5 mg Oral Daily   pantoprazole  40 mg Oral BID   pregabalin  75 mg Oral TID   spironolactone  100 mg Oral Daily   thiamine  100 mg Oral Daily   vancomycin  125 mg Oral QID   Vitamin D (Ergocalciferol)  50,000 Units Oral Q7 days   Continuous Infusions:  sodium chloride Stopped (12/31/20 0118)   magnesium sulfate bolus IVPB 2 g (01/08/21 1141)     LOS: 21 days    Time spent: 32 minutes    Marrion Coy, MD [facility]   To contact the attending provider between 7A-7P or the covering provider during after hours 7P-7A, please log into the web site [email or web address] and access using universal [place] password for that web site. If you do not have the password, please call the hospital operator.  01/08/2021, 12:15 PM

## 2021-01-09 DIAGNOSIS — A419 Sepsis, unspecified organism: Secondary | ICD-10-CM | POA: Diagnosis not present

## 2021-01-09 DIAGNOSIS — R6520 Severe sepsis without septic shock: Secondary | ICD-10-CM | POA: Diagnosis not present

## 2021-01-09 DIAGNOSIS — K7682 Hepatic encephalopathy: Secondary | ICD-10-CM | POA: Diagnosis not present

## 2021-01-09 DIAGNOSIS — J690 Pneumonitis due to inhalation of food and vomit: Secondary | ICD-10-CM | POA: Diagnosis not present

## 2021-01-09 LAB — COMPREHENSIVE METABOLIC PANEL
ALT: 29 U/L (ref 0–44)
AST: 143 U/L — ABNORMAL HIGH (ref 15–41)
Albumin: <1.5 g/dL — ABNORMAL LOW (ref 3.5–5.0)
Alkaline Phosphatase: 219 U/L — ABNORMAL HIGH (ref 38–126)
Anion gap: 14 (ref 5–15)
BUN: 20 mg/dL (ref 6–20)
CO2: 34 mmol/L — ABNORMAL HIGH (ref 22–32)
Calcium: 8 mg/dL — ABNORMAL LOW (ref 8.9–10.3)
Chloride: 87 mmol/L — ABNORMAL LOW (ref 98–111)
Creatinine, Ser: 0.55 mg/dL (ref 0.44–1.00)
GFR, Estimated: >60 mL/min (ref >60)
Glucose, Bld: 161 mg/dL — ABNORMAL HIGH (ref 70–99)
Potassium: 3.6 mmol/L (ref 3.5–5.1)
Sodium: 135 mmol/L (ref 135–145)
Total Bilirubin: 9.6 mg/dL — ABNORMAL HIGH (ref 0.3–1.2)
Total Protein: 6.5 g/dL (ref 6.5–8.1)

## 2021-01-09 LAB — CULTURE, BLOOD (SINGLE)
Culture: NO GROWTH
Culture: NO GROWTH
Special Requests: ADEQUATE

## 2021-01-09 LAB — URINALYSIS, COMPLETE (UACMP) WITH MICROSCOPIC
Glucose, UA: NEGATIVE mg/dL
Ketones, ur: NEGATIVE mg/dL
Nitrite: NEGATIVE
Protein, ur: 30 mg/dL — AB
Specific Gravity, Urine: 1.012 (ref 1.005–1.030)
WBC, UA: >50 WBC/hpf — ABNORMAL HIGH (ref 0–5)
pH: 6 (ref 5.0–8.0)

## 2021-01-09 LAB — GLUCOSE, CAPILLARY
Glucose-Capillary: 187 mg/dL — ABNORMAL HIGH (ref 70–99)
Glucose-Capillary: 219 mg/dL — ABNORMAL HIGH (ref 70–99)
Glucose-Capillary: 231 mg/dL — ABNORMAL HIGH (ref 70–99)
Glucose-Capillary: 204 mg/dL — ABNORMAL HIGH (ref 70–99)

## 2021-01-09 LAB — CBC WITH DIFFERENTIAL/PLATELET
Basophils Absolute: 0.1 10*3/uL (ref 0.0–0.1)
Basophils Relative: 0 %
Eosinophils Absolute: 0.3 10*3/uL (ref 0.0–0.5)
Eosinophils Relative: 0 %
HCT: 26 % — ABNORMAL LOW (ref 36.0–46.0)
Hemoglobin: 8.2 g/dL — ABNORMAL LOW (ref 12.0–15.0)
Lymphocytes Relative: 9 %
Lymphs Abs: 3.1 10*3/uL (ref 0.7–4.0)
MCH: 36.3 pg — ABNORMAL HIGH (ref 26.0–34.0)
MCHC: 31.5 g/dL (ref 30.0–36.0)
MCV: 115 fL — ABNORMAL HIGH (ref 80.0–100.0)
Monocytes Absolute: 1.3 10*3/uL — ABNORMAL HIGH (ref 0.1–1.0)
Monocytes Relative: 6 %
Neutro Abs: 28.6 10*3/uL — ABNORMAL HIGH (ref 1.7–7.7)
Neutrophils Relative %: 85 %
Platelets: 282 10*3/uL (ref 150–400)
RBC: 2.26 MIL/uL — ABNORMAL LOW (ref 3.87–5.11)
RDW: 19.3 % — ABNORMAL HIGH (ref 11.5–15.5)
WBC: 34 10*3/uL — ABNORMAL HIGH (ref 4.0–10.5)
nRBC: 0 /100 WBC
nRBC: 0.1 % (ref 0.0–0.2)

## 2021-01-09 LAB — BODY FLUID CULTURE W GRAM STAIN: Culture: NO GROWTH

## 2021-01-09 LAB — MAGNESIUM: Magnesium: 1.8 mg/dL (ref 1.7–2.4)

## 2021-01-09 LAB — CYTOLOGY - NON PAP

## 2021-01-09 MED ORDER — SODIUM CHLORIDE 0.9 % IV SOLN
2.0000 g | Freq: Two times a day (BID) | INTRAVENOUS | Status: DC
  Administered 2021-01-09 – 2021-01-11 (×5): 2 g via INTRAVENOUS
  Filled 2021-01-09 (×8): qty 2

## 2021-01-09 NOTE — Plan of Care (Signed)
  Problem: Education: Goal: Knowledge of General Education information will improve Description Including pain rating scale, medication(s)/side effects and non-pharmacologic comfort measures Outcome: Progressing   

## 2021-01-09 NOTE — Progress Notes (Signed)
Occupational Therapy Treatment Patient Details Name: [name] MRN: [id number] DOB: Jul 02, 1975 Today's Date: 01/09/2021   History of present illness 45 y.o. female with medical history significant for Bipolar disorder, diabetes, alcohol use disorder and polysubstance abuse, FSH muscular dystrophy, with history of frequent falls, last seen by neurology in January 2020, who was brought in by EMS after she fell at home 2 days prior and was unable to get up.   OT comments  Pt seen for OT tx following PT session. Pt declined EOB/OOB ADL session but agreeable to bed level session. Pt instructed in compression stocking mgt and ther ex (shoulder rolls, shoulder retraction, and chin tucks). Pt verbalized understanding of stocking instructions and able to return demo technique for exercises and agreeable to perform at least 10x/day. Ex written on piece of paper to support recall and carryover. Pt progressing towards goals, continues to benefit from skilled OT services. Continue to recommend SNF at discharge.    Recommendations for follow up therapy are one component of a multi-disciplinary discharge planning process, led by the attending physician.  Recommendations may be updated based on patient status, additional functional criteria and insurance authorization.    Follow Up Recommendations  Skilled nursing-short term rehab (<3 hours/day)    Assistance Recommended at Discharge    Equipment Recommendations  [facility]    Recommendations for Other Services      Precautions / Restrictions Precautions Precautions: Fall Precaution Comments: increasing WBCs Restrictions Weight Bearing Restrictions: No       Mobility Bed Mobility Overal bed mobility: Needs Assistance Bed Mobility: Rolling Rolling: Modified independent (Device/Increase time)       General bed mobility comments: mod indep with rolling to L side so OT could place pillow under R hip per pt's request    Transfers                    General transfer comment: pt defers to attempt due to fatigue/weakness     Balance Overall balance assessment: Needs assistance Sitting-balance support: Feet supported Sitting balance-Leahy Scale: Fair                                     ADL either performed or assessed with clinical judgement   ADL Overall ADL's : Needs assistance/impaired                     Lower Body Dressing: Bed level;Moderate assistance Lower Body Dressing Details (indicate cue type and reason): Pt required MOD A for donning compression stockings over feet, pt able to pull up with some increased effort once pulled up past her feet. Figure 4 technique used to don grippy socks over                     Vision       Perception     Praxis      Cognition Arousal/Alertness: Awake/alert Behavior During Therapy: WFL for tasks assessed/performed Overall Cognitive Status: Within Functional Limits for tasks assessed                                 General Comments: agreeable to therapy          Exercises Other Exercises Other Exercises: Pt instructed in compression stocking mgt and ther ex (shoulder rolls, shoulder retraction, and chin tucks)  Shoulder Instructions       General Comments      Pertinent Vitals/ Pain       Pain Assessment: No/denies pain  Home Living                                          Prior Functioning/Environment              Frequency  Min 2X/week        Progress Toward Goals  OT Goals(current goals can now be found in the care plan section)  Progress towards OT goals: Progressing toward goals  Acute Rehab OT Goals OT Goal Formulation: With patient Time For Goal Achievement: 01/17/21 Potential to Achieve Goals: Fair  Plan Discharge plan remains appropriate;Frequency remains appropriate    Co-evaluation                 AM-PAC OT "6 Clicks" Daily Activity     Outcome  Measure   Help from another person eating meals?: None Help from another person taking care of personal grooming?: A Little Help from another person toileting, which includes using toliet, bedpan, or urinal?: A Lot Help from another person bathing (including washing, rinsing, drying)?: A Lot Help from another person to put on and taking off regular upper body clothing?: A Little Help from another person to put on and taking off regular lower body clothing?: A Lot 6 Click Score: 16    End of Session Equipment Utilized During Treatment: Oxygen  OT Visit Diagnosis: Unsteadiness on feet (R26.81);Muscle weakness (generalized) (M62.81);History of falling (Z91.81)   Activity Tolerance Patient tolerated treatment well   Patient Left in bed;with call bell/phone within reach;with bed alarm set   Nurse Communication          Time: 0177-9390 OT Time Calculation (min): 17 min  Charges: OT General Charges $OT Visit: 1 Visit OT Treatments $Therapeutic Exercise: 8-22 mins  Arman Filter., MPH, MS, OTR/L ascom [phone] 01/09/21, 4:29 PM

## 2021-01-09 NOTE — Progress Notes (Signed)
PROGRESS NOTE    [name]  UXN:[id number] DOB: 05/05/1975 DOA: 12/17/2020 PCP: Eunice Blase, PA-C    Brief Narrative:  [name] is a 45 y.o. female with medical history significant for Bipolar disorder, diabetes mellitus alcohol use disorder and polysubstance abuse, FSH muscular dystrophy, with history of falls, last seen by neurology in January 2020, who was brought in by EMS was brought into the hospital after sustaining a fall and was unable to get up.  In the ED, patient was noted to have severe hyponatremia with sodium level of 113, potassium of 2.9.  Ammonia was elevated at 52 with bilirubin of 11.9.  Patient did have a right lower lobe infiltrate and received antibiotic with Unasyn and IV fluids.  She was also noted to have hepatic encephalopathy and was admitted to hospital.  Patient initially completed the whole course of Unasyn and Zyvox.   Assessment & Plan:   Principal Problem:   Sepsis ([name]) Active Problems:   Alcohol withdrawal ([name])   Bipolar 2 disorder ([name])   Hypokalemia   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[name])   Acute hyponatremia   Alcohol use disorder, moderate, dependence ([name])   Hepatic steatosis   Polysubstance abuse ([name])   Hyperglycemia due to type 2 diabetes mellitus ([name])   Hyperbilirubinemia   Lactic acidosis   Alcoholic liver disease ([name])   Acute hepatic encephalopathy   Pressure injury of skin   Aspiration pneumonia of both lower lobes due to gastric secretions ([name])  Sepsis. Acute hypoxemic respiratory failure secondary to aspiration pneumonia Aspiration pneumonia. Persistent leukocytosis. Patient still has significant leukocytosis, slowly getting worse.  I have consulted ID, Dr. Sampson Goon who will see the patient, will consider remove tunneled IJ catheter. Patient was also started on oral vancomycin, but leukocytosis does not seem to be improving.  Diarrhea has resolved on the same day when vancomycin was  started. Currently, patient does not have evidence of pneumonia, peritonitis.  UA now has more than 50 RBC.  Will obtain urine culture.  Alcohol abuse and polydrug abuse. 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 Liver failure secondary to alcoholic liver cirrhosis. Acute hepatic encephalopathy. Severe hypoalbuminemia secondary to liver cirrhosis. Patient condition has been stable, patient does not have any altered mental status anymore.  I will recheck ammonia level.  Patient currently on as needed lactulose.   Hyponatremia. Hyperkalemia followed by hypokalemia Hypomagnesemia. Hypophosphatemia. Conditions improved.   DVT prophylaxis: lovenox Code Status: full Family Communication:  Disposition Plan:      Status is: Inpatient   Remains inpatient appropriate because: Severity of disease, and unsafe discharge       I/O last 3 completed shifts: In: 0  Out: 2700 [Urine:2700] No intake/output data recorded.   Consultants:  GI, ID   Procedures: None   Antimicrobials: Vancomycin oral    Subjective: Patient doing well today, she slept well last night, no confusion. She is still on 3 L oxygen, no short of breath. She has some abdominal distention, but no abdominal pain or nausea vomiting. She had a normal looking bowel movement. No fever or chills. No dysuria hematuria.  Objective: Vitals:   01/08/21 1641 01/08/21 2018 01/09/21 0453 01/09/21 0522  BP: 115/72 119/69 117/68   Pulse: (!) 105 (!) 110 (!) 103   Resp: 18 20 20    Temp: 99.2 F (37.3 C) 98.7 F (37.1 C) (!) 97.3 F (36.3 C)   TempSrc: Oral  Oral   SpO2: 93% 93% 93%  Weight:    107.6 kg  Height:        Intake/Output Summary (Last 24 hours) at 01/09/2021 1219 Last data filed at 01/09/2021 0520 Gross per 24 hour  Intake 0 ml  Output 1300 ml  Net -1300 ml   Filed Weights   01/07/21 0455 01/08/21 0357 01/09/21 0522  Weight: 106.4 kg 108.3 kg 107.6 kg     Examination:  General exam: Appears calm and comfortable  Respiratory system: Clear to auscultation. Respiratory effort normal. Cardiovascular system: S1 & S2 heard, RRR. No JVD, murmurs, rubs, gallops or clicks. No pedal edema. Gastrointestinal system: Abdomen is distended, soft and nontender. No organomegaly or masses felt. Normal bowel sounds heard. Central nervous system: Alert and oriented. No focal neurological deficits. Extremities: Symmetric 5 x 5 power. Skin: Jaundice Psychiatry: Judgement and insight appear normal. Mood & affect appropriate.     Data Reviewed: I have personally reviewed following labs and imaging studies  CBC: Recent Labs  Lab 01/05/21 0556 01/06/21 0631 01/07/21 0449 01/08/21 0442 01/09/21 0511  WBC 26.1* 26.8* 28.4* 32.8* 34.0*  NEUTROABS 21.9* 22.6* 24.5* 28.1* PENDING  HGB 8.2* 7.9* 8.2* 8.0* 8.2*  HCT 26.5* 25.6* 26.3* 25.9* 26.0*  MCV 112.8* 112.8* 114.8* 113.1* 115.0*  PLT 214 193 186 250 282   Basic Metabolic Panel: Recent Labs  Lab 01/03/21 0750 01/04/21 0311 01/05/21 0556 01/06/21 0631 01/07/21 0449 01/08/21 0442 01/09/21 0511  NA 138 135 136 136 135 133* 135  K 3.2* 3.3* 3.1* 3.2* 3.3* 4.0 3.6  CL 89* 87* 89* 91* 90* 88* 87*  CO2 38* 37* 37* 36* 34* 35* 34*  GLUCOSE 240* 179* 179* 247* 254* 225* 161*  BUN 18 17 19 19 18 17 20   CREATININE 0.55 0.62 0.48 0.55 0.54 0.53 0.55  CALCIUM 7.6* 7.5* 7.3* 7.3* 7.4* 7.8* 8.0*  MG 1.2* 1.6* 1.7 1.5* 1.8 1.6* 1.8  PHOS 2.7 2.9 2.7  --   --  3.0  --    GFR: Estimated Creatinine Clearance: 113.3 mL/min (by C-G formula based on SCr of 0.55 mg/dL). Liver Function Tests: Recent Labs  Lab 01/03/21 0750 01/05/21 0556 01/06/21 0631 01/08/21 0442 01/09/21 0511  AST 174* 161* 158* 154* 143*  ALT 34 31 30 28 29   ALKPHOS 205* 190* 198* 201* 219*  BILITOT 9.1* 9.9* 9.8* 9.8* 9.6*  PROT 6.1* 6.1* 6.3* 6.5 6.5  ALBUMIN <1.5* <1.5* <1.5* 1.5* <1.5*   No results for input(s): LIPASE,  AMYLASE in the last 168 hours. No results for input(s): AMMONIA in the last 168 hours. Coagulation Profile: Recent Labs  Lab 01/03/21 0750  INR 1.1   Cardiac Enzymes: No results for input(s): CKTOTAL, CKMB, CKMBINDEX, TROPONINI in the last 168 hours. BNP (last 3 results) No results for input(s): PROBNP in the last 8760 hours. HbA1C: No results for input(s): HGBA1C in the last 72 hours. CBG: Recent Labs  Lab 01/08/21 0833 01/08/21 1202 01/08/21 1639 01/08/21 2138 01/09/21 0825  GLUCAP 216* 273* 225* 239* 187*   Lipid Profile: No results for input(s): CHOL, HDL, LDLCALC, TRIG, CHOLHDL, LDLDIRECT in the last 72 hours. Thyroid Function Tests: No results for input(s): TSH, T4TOTAL, FREET4, T3FREE, THYROIDAB in the last 72 hours. Anemia Panel: No results for input(s): VITAMINB12, FOLATE, FERRITIN, TIBC, IRON, RETICCTPCT in the last 72 hours. Sepsis Labs: Recent Labs  Lab 01/04/21 0311 01/05/21 0556 01/06/21 0631  PROCALCITON 1.77 2.73 2.54    Recent Results (from the past 240 hour(s))  Cath Tip Culture  Status: None   Collection Time: 01/04/21  1:59 PM   Specimen: Catheter Tip; Other  Result Value Ref Range Status   Specimen Description   Final    CATH TIP Performed at [facility], [address] [place], [place] [address]    Special Requests   Final    NONE Performed at [facility], [address], [place], [place] [address]    Culture   Final    NO GROWTH 2 DAYS Performed at [facility], [address] [place], [place] [address]    Report Status 01/07/2021 FINAL  Final  Culture, blood (single) w Reflex to ID Panel     Status: None   Collection Time: 01/04/21  2:00 PM   Specimen: BLOOD  Result Value Ref Range Status   Specimen Description BLOOD ALIN  Final   Special Requests   Final    BOTTLES DRAWN AEROBIC AND ANAEROBIC Blood Culture results may not be optimal due to an excessive volume of blood received in culture bottles    Culture   Final    NO GROWTH 5 DAYS Performed at [facility], [address], [place], [place] [address]    Report Status 01/09/2021 FINAL  Final  Culture, blood (single) w Reflex to ID Panel     Status: None   Collection Time: 01/04/21  2:35 PM   Specimen: BLOOD  Result Value Ref Range Status   Specimen Description BLOOD LEFT ANTECUBITAL  Final   Special Requests   Final    BOTTLES DRAWN AEROBIC AND ANAEROBIC Blood Culture adequate volume   Culture   Final    NO GROWTH 5 DAYS Performed at [facility], [address] [place], [place] [address]    Report Status 01/09/2021 FINAL  Final  Body fluid culture w Gram Stain     Status: None   Collection Time: 01/06/21 10:55 AM   Specimen: PATH Cytology Peritoneal fluid  Result Value Ref Range Status   Specimen Description   Final    PERITONEAL Performed at [facility], [address] [place], [place] [address]    Special Requests   Final    NONE Performed at [facility], [address] [place], [place] [address]    Gram Stain   Final    FEW WBC PRESENT, PREDOMINANTLY MONONUCLEAR NO ORGANISMS SEEN    Culture   Final    NO GROWTH 3 DAYS Performed at [facility], [address] [place], [place] [address]    Report Status 01/09/2021 FINAL  Final         Radiology Studies: No results found.      Scheduled Meds:  budesonide  9 mg Oral Daily   feeding supplement  237 mL Oral TID BM   FLUoxetine  80 mg Oral Daily   fluticasone  1 spray Each Nare Daily   folic acid  1 mg Oral Daily   furosemide  40 mg Oral Daily   insulin aspart  0-20 Units Subcutaneous TID WC   insulin aspart  0-5 Units Subcutaneous QHS   insulin aspart  6 Units Subcutaneous TID WC   insulin glargine-yfgn  26 Units Subcutaneous QHS   multivitamin with minerals  1 tablet Oral Daily   OLANZapine  5 mg Oral Daily   pantoprazole  40 mg Oral BID   pregabalin  75 mg Oral TID   spironolactone  100 mg  Oral Daily   thiamine  100 mg Oral Daily   vancomycin  125 mg Oral QID   Vitamin D (Ergocalciferol)  50,000 Units Oral Q7 days   Continuous Infusions:  sodium chloride Stopped (12/31/20 0118)     LOS: 22 days    Time spent: 28 minutes    Marrion Coy, MD [facility]   To contact the attending provider between 7A-7P or the covering provider during after hours 7P-7A, please log into the web site [email or web address] and access using universal [place] password for that web site. If you do not have the password, please call the hospital operator.  01/09/2021, 12:19 PM

## 2021-01-09 NOTE — Progress Notes (Signed)
Nutrition Follow Up Note   DOCUMENTATION CODES:  Morbid obesity  INTERVENTION:  Continue current diet as ordered, encourage PO intake Ensure Enlive po to TID, each supplement provides 350 kcal and 20 grams of protein Continue vitamin regimen daily  NUTRITION DIAGNOSIS:  Inadequate oral intake related to acute illness as evidenced by per patient/family report.  GOAL:  Patient will meet greater than or equal to 90% of their needs -progressing   MONITOR:  PO intake, Supplement acceptance, Labs, Weight trends, Skin, I & O's  ASSESSMENT:  45 y.o. 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 Pt found to have cirrhosis and pulmonary hypertension.   Patient continues to have stable intake and stable weight this admission. Pt accepting most nutrition supplements as well. Will continue current nutrition plan.  CM working on [employer], will require [facility] rehab prior to returning home.  Average Meal Intake: 10/6-12/1678: 80% intake x 14 recorded meals  Nutritionally Relevant Medications: Scheduled Meds:  feeding supplement  237 mL Oral TID BM   folic acid  1 mg Oral Daily   furosemide  40 mg Oral Daily   insulin aspart  0-20 Units Subcutaneous TID WC   insulin aspart  0-5 Units Subcutaneous QHS   insulin aspart  6 Units Subcutaneous TID WC   insulin glargine-yfgn  26 Units Subcutaneous QHS   multivitamin with minerals  1 tablet Oral Daily   pantoprazole  40 mg Oral BID   spironolactone  100 mg Oral Daily   thiamine  100 mg Oral Daily   vancomycin  125 mg Oral QID   Vitamin D (Ergocalciferol)  50,000 Units Oral Q7 days   PRN Meds: dicyclomine, lactulose, ondansetron   Labs Reviewed: SBG ranges from 187-273 mg/dL over the last 24  hours  Diet Order:   Diet Order             Diet Carb Modified Fluid consistency: Thin; Room service appropriate? Yes  Diet effective now                  EDUCATION NEEDS:  Education needs have been addressed  Skin:  Skin Assessment: Reviewed RN Assessment  Last BM:  10/23 - type 6  Height: Ht Readings from Last 1 Encounters:  12/17/20 5\' 7"  (1.702 m)   Weight:  Wt Readings from Last 1 Encounters:  01/09/21 107.6 kg   Ideal Body Weight:  61.36 kg  BMI:  Body mass index is 37.15 kg/m.  Estimated Nutritional Needs:  Kcal:  2100-2400kcal/day Protein:  105-120g/day Fluid:  1.9-2.2L/day   01/11/21, RD, LDN Clinical Dietitian RD pager # available in AMION  After hours/weekend pager # available in [facility]

## 2021-01-09 NOTE — Progress Notes (Signed)
Physical Therapy Treatment Patient Details Name: [name] MRN: [id number] DOB: 07-Aug-1975 Today's Date: 01/09/2021   History of Present Illness 45 y.o. female with medical history significant for Bipolar disorder, diabetes, alcohol use disorder and polysubstance abuse, FSH muscular dystrophy, with history of frequent falls, last seen by neurology in January 2020, who was brought in by EMS after she fell at home 2 days prior and was unable to get up.    PT Comments    Pt is making gradual progress towards goals, however continues to lack motivation when therapist not in room to attempt mobility with other staff. Encouraged to sit up on EOB with nursing for med pass as she requires very minimal assistance for bed mobility. Pt has continued written HEP and is concerned with WBC increasing. Discussed with OT for co-treat for possible using sit<>Stand lift to improve functional mobility. Will continue to progress.    Recommendations for follow up therapy are one component of a multi-disciplinary discharge planning process, led by the attending physician.  Recommendations may be updated based on patient status, additional functional criteria and insurance authorization.  Follow Up Recommendations  Skilled nursing-short term rehab (<3 hours/day)     Assistance Recommended at Discharge Frequent or constant Supervision/Assistance  Equipment Recommendations   (TBD)    Recommendations for Other Services       Precautions / Restrictions Precautions Precautions: Fall Precaution Comments: increasing WBCs Restrictions Weight Bearing Restrictions: No     Mobility  Bed Mobility Overal bed mobility: Needs Assistance Bed Mobility: Supine to Sit;Sit to Supine Rolling: Min guard   Supine to sit: Min guard     General bed mobility comments: improved technique for bed mobility. Once seated, able to sit with upright posture. Needs assist with bed in tren[place] to slide up towards HOB.     Transfers                   General transfer comment: pt defers to attempt due to fatigue/weakness    Ambulation/Gait             General Gait Details: unsafe/unable   Stairs             Wheelchair Mobility    Modified Rankin (Stroke Patients Only)       Balance Overall balance assessment: Needs assistance Sitting-balance support: Feet supported Sitting balance-Leahy Scale: Fair                                      Cognition Arousal/Alertness: Awake/alert Behavior During Therapy: WFL for tasks assessed/performed Overall Cognitive Status: Within Functional Limits for tasks assessed                                 General Comments: Pt alert and oriented and motivated to work with therapy        Exercises Other Exercises Other Exercises: B LE ther-ex performed in sitting including LAQ. x 15 reps. Scap squeezes x 10 reps. Also encouraged to continue performing HEP in supine.    General Comments        Pertinent Vitals/Pain Pain Assessment: No/denies pain    Home Living                          Prior Function  PT Goals (current goals can now be found in the care plan section) Acute Rehab PT Goals Patient Stated Goal: to get stronger and stand again PT Goal Formulation: With patient Time For Goal Achievement: 01/18/21 Potential to Achieve Goals: Fair Progress towards PT goals: Progressing toward goals    Frequency    Min 2X/week      PT Plan Current plan remains appropriate    Co-evaluation              AM-PAC PT "6 Clicks" Mobility   Outcome Measure  Help needed turning from your back to your side while in a flat bed without using bedrails?: A Little Help needed moving from lying on your back to sitting on the side of a flat bed without using bedrails?: A Little Help needed moving to and from a bed to a chair (including a wheelchair)?: A Lot Help needed standing up  from a chair using your arms (e.g., wheelchair or bedside chair)?: Total Help needed to walk in hospital room?: Total Help needed climbing 3-5 steps with a railing? : Total 6 Click Score: 11    End of Session Equipment Utilized During Treatment: Oxygen Activity Tolerance: Patient limited by fatigue Patient left: in bed (left with OT) Nurse Communication: Mobility status PT Visit Diagnosis: Other abnormalities of gait and mobility (R26.89);Muscle weakness (generalized) (M62.81);History of falling (Z91.81)     Time: 0175-1025 PT Time Calculation (min) (ACUTE ONLY): 23 min  Charges:  $Therapeutic Exercise: 23-37 mins                     Elizabeth Palau, [place], DPT [phone]    Graylon Amory 01/09/2021, 3:43 PM

## 2021-01-09 NOTE — TOC Progression Note (Signed)
Transition of Care [facility]) - Progression Note    Patient Details  Name: [name] MRN: [id number] Date of Birth: 1976-01-20  Transition of Care [facility]) CM/SW Contact  Chapman Fitch, RN Phone Number: 01/09/2021, 9:42 AM  Clinical Narrative:     No bed offers Requested [name] at [facility] to review    Expected Discharge Plan: Skilled Nursing Facility Barriers to Discharge: Continued Medical Work up  Expected Discharge Plan and Services Expected Discharge Plan: Skilled Nursing Facility     Post Acute Care Choice: Skilled Nursing Facility Living arrangements for the past 2 months: Single Family Home                                       Social Determinants of Health (SDOH) Interventions    Readmission Risk Interventions No flowsheet data found.

## 2021-01-09 NOTE — Consult Note (Signed)
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 female admitted October 1, 3 weeks ago with falls and weakness.  On arrival she was found to have sodium of 113 elevated LFTs.  She had a known history of bipolar disorder, diabetes, alcohol use and polysubstance abuse, muscular dystrophy.  She has had a complicated admission since then.  She was seen by renal for her hyponatremia, she was treated for an aspiration pneumonia as chest x-ray showed a right lower lobe pneumonia.  On admission her white count was 11.  It trended up over the first few days and had been persistently elevated in the teens until approximately 12 per 16th and 17th when it started to go up to 24.  Is remained elevated and is currently up to 34. She was treated initially on admission with Unasyn from October 1 through 3 she was treated with vancomycin from the third through the sixth.  She was also started on rifaximin.  She started linezolid October 6 through 11.  On October 19 she was restarted on Unasyn 3 the 21st.  She started on oral vancomycin on October 22. He was seen by gastroenterology and was treated with lactulose and rifaximin for hepatic encephalopathy.  Undergone paracentesis Oct  14th with no evidence of SBP.  She did have MRI done reveals hepatomegaly and  lobular hepatic contours.  She also has poorly controlled diabetes with an A1c of 11.  The GI work-up was consistent with underlying liver disease from alcohol.  Previously she was having 4-5 loose bowel movements a day while she was on lactulose.  To her diarrhea the lactulose was eventually discontinued.  She had a repeat paracenteses done on October 31.  There are 96 nucleated cells 15% PMNs.  Gram stain and culture have been negative.   Uurinalysis on October 23 showed presence of white blood clumps as well as greater than 50 white cells. Cultures from admission were negative in the blood October 2.  Peritoneal fluid culture negative on the 14th.  Cath tip removed on the 19th and that was negative.   Follow-up blood cultures October 19 were negative.  Peritoneal fluid cultures October 21 are negative. Chest x-ray October 20 was stable elevated right hemidiaphragm with basilar atelectasis and effusion. She has had negative CMV testing EBV HSV hepatitis B and C and HIV all negative.  Drug screen on admission was positive for cocaine and benzos.  Recent imaging is reviewed. Past Medical History:  Diagnosis Date   Alcohol abuse    Anxiety    Anxiety    Anxiety and depression    Asthma    Bipolar disorder ([place])    Borderline personality disorder ([place])    Depression    Diabetes ([place])    Type 2    Dyslipidemia  Esophageal varices ([name])    Foot drop    right   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[name])    GERD (gastroesophageal reflux disease)    Gout    History of drug abuse ([place])    Insomnia    Muscular dystrophies ([place])    Neuropathy    Pancreatitis    Recovering alcoholic ([place])    Past Surgical History:  Procedure Laterality Date   HERNIA REPAIR     IR FLUORO GUIDE CV LINE RIGHT  12/30/2020   IR US GUIDE VASC ACCESS RIGHT  12/30/2020   Social History   Tobacco Use   Smoking status: Every Day    Packs/day: 0.50    Types: Cigarettes   Smokeless tobacco: Never   Substance Use Topics   Alcohol use: Yes   Drug use: Yes    Types: IV, Cocaine   Family History  Problem Relation Age of Onset   Heart disease Mother    Heart attack Mother    Hyperlipidemia Maternal Grandmother    Diabetes Maternal Grandmother    Hypertension Maternal Grandmother    Hyperlipidemia Maternal Grandfather    Heart disease Maternal Grandfather    Heart attack Maternal Grandfather    Diabetes Maternal Grandfather    Lung cancer Maternal Grandfather    Hypertension Maternal Grandfather    Other Paternal Grandmother        Thyroid problems    Heart disease Paternal Grandfather    Stroke Paternal Grandfather    Hyperlipidemia Paternal Grandfather    Heart attack Paternal Grandfather    Hypertension Paternal Grandfather     Allergies:  Allergies  Allergen Reactions   Erythromycin Anaphylaxis   Sulfa Antibiotics Anaphylaxis   Tylenol [Acetaminophen] Other (See Comments)    Pancreatitis and liver dysfunction, not supposed to take med   Nsaids Other (See Comments)    Causes GI problems, very bad reaction-per patient.    Gabapentin     Mood changes     Current antibiotics: Antibiotics Given (last 72 hours)     Date/Time Action Medication Dose Rate   01/06/21 1254 New Bag/Given   Ampicillin-Sulbactam (UNASYN) 3 g in sodium chloride 0.9 % 100 mL IVPB 3 g 200 mL/hr   01/06/21 1628 New Bag/Given   Ampicillin-Sulbactam (UNASYN) 3 g in sodium chloride 0.9 % 100 mL IVPB 3 g 200 mL/hr   01/06/21 2158 Given   rifaximin (XIFAXAN) tablet 550 mg 550 mg    01/06/21 2207 New Bag/Given   Ampicillin-Sulbactam (UNASYN) 3 g in sodium chloride 0.9 % 100 mL IVPB 3 g 200 mL/hr   01/07/21 0341 New Bag/Given   Ampicillin-Sulbactam (UNASYN) 3 g in sodium chloride 0.9 % 100 mL IVPB 3 g 200 mL/hr   01/07/21 0839 Given   rifaximin (XIFAXAN) tablet 550 mg 550 mg    01/07/21 1737 Given   vancomycin (VANCOCIN) capsule 125 mg 125 mg    01/07/21 2115 Given   vancomycin (VANCOCIN)  capsule 125 mg 125 mg    01/08/21 1116 Given   vancomycin (VANCOCIN) capsule 125 mg 125 mg    01/08/21 1508 Given   vancomycin (VANCOCIN) capsule 125 mg 125 mg    01/08/21 1823 Given   vancomycin (VANCOCIN) capsule 125 mg 125 mg    01/08/21 2217 Given   vancomycin (VANCOCIN) capsule 125 mg 125 mg    01/09/21 0852 Given   vancomycin (VANCOCIN) capsule 125 mg 125 mg        MEDICATIONS:  budesonide  9  mg Oral Daily   feeding supplement  237 mL Oral TID BM   FLUoxetine  80 mg Oral Daily   fluticasone  1 spray Each Nare Daily   folic acid  1 mg Oral Daily   furosemide  40 mg Oral Daily   insulin aspart  0-20 Units Subcutaneous TID WC   insulin aspart  0-5 Units Subcutaneous QHS   insulin aspart  6 Units Subcutaneous TID WC   insulin glargine-yfgn  26 Units Subcutaneous QHS   multivitamin with minerals  1 tablet Oral Daily   OLANZapine  5 mg Oral Daily   pantoprazole  40 mg Oral BID   pregabalin  75 mg Oral TID   spironolactone  100 mg Oral Daily   thiamine  100 mg Oral Daily   vancomycin  125 mg Oral QID   Vitamin D (Ergocalciferol)  50,000 Units Oral Q7 days    Review of Systems - 11 systems reviewed and negative per HPI   OBJECTIVE: Temp:  [97.3 F (36.3 C)-99.2 F (37.3 C)] 97.3 F (36.3 C) (10/24 0453) Pulse Rate:  [103-110] 103 (10/24 0453) Resp:  [18-20] 20 (10/24 0453) BP: (115-119)/(68-72) 117/68 (10/24 0453) SpO2:  [93 %] 93 % (10/24 0453) Weight:  [107.6 kg] 107.6 kg (10/24 0522) Physical Exam  Constitutional:  oriented to person, place, and time. Adeeply jaundiced  obese HENT: [place]/AT, PERRLA, ++ scleral icterus Mouth/Throat: Oropharynx is clear and moist. No oropharyngeal exudate.  Cardiovascular: Normal rate, regular rhythm and normal heart sounds. EPulmonary/Chest: Effort normal and breath sounds normal. No respiratory distress.  has no wheezes.  Neck = supple, no nuchal rigidity Abdominal: Soft. Obese, Bowel sounds are normal.  exhibits no distension.  There is no tenderness.  Lymphadenopathy: no cervical adenopathy. No axillary adenopathy Neurological: alert and oriented to person, place, and time.  Skin: Skin is warm and dry. No rash noted. No erythema.  Psychiatric: a normal mood and affect.  behavior is normal.    LABS: Results for orders placed or performed during the hospital encounter of 12/17/20 (from the past 48 hour(s))  Glucose, capillary     Status: Abnormal   Collection Time: 01/07/21  5:11 PM  Result Value Ref Range   Glucose-Capillary 344 (H) 70 - 99 mg/dL    Comment: Glucose reference range applies only to samples taken after fasting for at least 8 hours.  Glucose, capillary     Status: Abnormal   Collection Time: 01/07/21  9:02 PM  Result Value Ref Range   Glucose-Capillary 234 (H) 70 - 99 mg/dL    Comment: Glucose reference range applies only to samples taken after fasting for at least 8 hours.  CBC with Differential/Platelet     Status: Abnormal   Collection Time: 01/08/21  4:42 AM  Result Value Ref Range   WBC 32.8 (H) 4.0 - 10.5 K/uL   RBC 2.29 (L) 3.87 - 5.11 MIL/uL   Hemoglobin 8.0 (L) 12.0 - 15.0 g/dL   HCT 25.9 (L) 36.0 - 46.0 %   MCV 113.1 (H) 80.0 - 100.0 fL   MCH 34.9 (H) 26.0 - 34.0 pg   MCHC 30.9 30.0 - 36.0 g/dL   RDW 19.6 (H) 11.5 - 15.5 %   Platelets 250 150 - 400 K/uL   nRBC 0.1 0.0 - 0.2 %   Neutrophils Relative % 86 %   Neutro Abs 28.1 (H) 1.7 - 7.7 K/uL   Lymphocytes Relative 8 %   Lymphs Abs 2.7 0.7 - 4.0 K/uL  Monocytes Relative 4 %   Monocytes Absolute 1.3 (H) 0.1 - 1.0 K/uL   Eosinophils Relative 1 %   Eosinophils Absolute 0.3 0.0 - 0.5 K/uL   Basophils Relative 0 %   Basophils Absolute 0.1 0.0 - 0.1 K/uL   Immature Granulocytes 1 %   Abs Immature Granulocytes 0.42 (H) 0.00 - 0.07 K/uL    Comment: Performed at [facility], [address] [place], [place] [address]  Comprehensive metabolic panel     Status: Abnormal   Collection Time: 01/08/21  4:42 AM  Result Value  Ref Range   Sodium 133 (L) 135 - 145 mmol/L   Potassium 4.0 3.5 - 5.1 mmol/L   Chloride 88 (L) 98 - 111 mmol/L   CO2 35 (H) 22 - 32 mmol/L   Glucose, Bld 225 (H) 70 - 99 mg/dL    Comment: Glucose reference range applies only to samples taken after fasting for at least 8 hours.   BUN 17 6 - 20 mg/dL   Creatinine, Ser 0.53 0.44 - 1.00 mg/dL   Calcium 7.8 (L) 8.9 - 10.3 mg/dL   Total Protein 6.5 6.5 - 8.1 g/dL   Albumin 1.5 (L) 3.5 - 5.0 g/dL   AST 154 (H) 15 - 41 U/L   ALT 28 0 - 44 U/L   Alkaline Phosphatase 201 (H) 38 - 126 U/L   Total Bilirubin 9.8 (H) 0.3 - 1.2 mg/dL   GFR, Estimated >60 >60 mL/min    Comment: (NOTE) Calculated using the CKD-EPI Creatinine Equation (2021)    Anion gap 10 5 - 15    Comment: Performed at [facility], [address], [place], [place] [address]  Magnesium     Status: Abnormal   Collection Time: 01/08/21  4:42 AM  Result Value Ref Range   Magnesium 1.6 (L) 1.7 - 2.4 mg/dL    Comment: Performed at [facility], [address] [place], [place] [address]  Phosphorus     Status: None   Collection Time: 01/08/21  4:42 AM  Result Value Ref Range   Phosphorus 3.0 2.5 - 4.6 mg/dL    Comment: ICTERUS AT THIS LEVEL MAY AFFECT RESULT Performed at [facility], [address] [place], [place] [address]   Glucose, capillary     Status: Abnormal   Collection Time: 01/08/21  8:33 AM  Result Value Ref Range   Glucose-Capillary 216 (H) 70 - 99 mg/dL    Comment: Glucose reference range applies only to samples taken after fasting for at least 8 hours.   Comment 1 Notify RN   Glucose, capillary     Status: Abnormal   Collection Time: 01/08/21 12:02 PM  Result Value Ref Range   Glucose-Capillary 273 (H) 70 - 99 mg/dL    Comment: Glucose reference range applies only to samples taken after fasting for at least 8 hours.   Comment 1 Notify RN   Glucose, capillary     Status: Abnormal   Collection Time: 01/08/21  4:39 PM  Result  Value Ref Range   Glucose-Capillary 225 (H) 70 - 99 mg/dL    Comment: Glucose reference range applies only to samples taken after fasting for at least 8 hours.   Comment 1 Notify RN   Glucose, capillary     Status: Abnormal   Collection Time: 01/08/21  9:38 PM  Result Value Ref Range   Glucose-Capillary 239 (H) 70 - 99 mg/dL    Comment: Glucose reference range applies only  to samples taken after fasting for at least 8 hours.  CBC with Differential/Platelet     Status: Abnormal (Preliminary result)   Collection Time: 01/09/21  5:11 AM  Result Value Ref Range   WBC 34.0 (H) 4.0 - 10.5 K/uL   RBC 2.26 (L) 3.87 - 5.11 MIL/uL   Hemoglobin 8.2 (L) 12.0 - 15.0 g/dL   HCT 26.0 (L) 36.0 - 46.0 %   MCV 115.0 (H) 80.0 - 100.0 fL   MCH 36.3 (H) 26.0 - 34.0 pg   MCHC 31.5 30.0 - 36.0 g/dL   RDW 19.3 (H) 11.5 - 15.5 %   Platelets 282 150 - 400 K/uL   nRBC 0.1 0.0 - 0.2 %    Comment: Performed at [facility], [address] [place], [place] [address]   Neutrophils Relative % PENDING %   Neutro Abs PENDING 1.7 - 7.7 K/uL   Band Neutrophils PENDING %   Lymphocytes Relative PENDING %   Lymphs Abs PENDING 0.7 - 4.0 K/uL   Monocytes Relative PENDING %   Monocytes Absolute PENDING 0.1 - 1.0 K/uL   Eosinophils Relative PENDING %   Eosinophils Absolute PENDING 0.0 - 0.5 K/uL   Basophils Relative PENDING %   Basophils Absolute PENDING 0.0 - 0.1 K/uL   WBC Morphology PENDING    RBC Morphology PENDING    Smear Review PENDING    Other PENDING %   nRBC PENDING 0 /100 WBC   Metamyelocytes Relative PENDING %   Myelocytes PENDING %   Promyelocytes Relative PENDING %   Blasts PENDING %   Immature Granulocytes PENDING %   Abs Immature Granulocytes PENDING 0.00 - 0.07 K/uL  Comprehensive metabolic panel     Status: Abnormal   Collection Time: 01/09/21  5:11 AM  Result Value Ref Range   Sodium 135 135 - 145 mmol/L   Potassium 3.6 3.5 - 5.1 mmol/L   Chloride 87 (L) 98 - 111 mmol/L   CO2 34  (H) 22 - 32 mmol/L   Glucose, Bld 161 (H) 70 - 99 mg/dL    Comment: Glucose reference range applies only to samples taken after fasting for at least 8 hours.   BUN 20 6 - 20 mg/dL   Creatinine, Ser 0.55 0.44 - 1.00 mg/dL   Calcium 8.0 (L) 8.9 - 10.3 mg/dL   Total Protein 6.5 6.5 - 8.1 g/dL   Albumin <1.5 (L) 3.5 - 5.0 g/dL   AST 143 (H) 15 - 41 U/L   ALT 29 0 - 44 U/L   Alkaline Phosphatase 219 (H) 38 - 126 U/L   Total Bilirubin 9.6 (H) 0.3 - 1.2 mg/dL   GFR, Estimated >60 >60 mL/min    Comment: (NOTE) Calculated using the CKD-EPI Creatinine Equation (2021)    Anion gap 14 5 - 15    Comment: Performed at [facility], [address] [place], [place] [address]  Magnesium     Status: None   Collection Time: 01/09/21  5:11 AM  Result Value Ref Range   Magnesium 1.8 1.7 - 2.4 mg/dL    Comment: Performed at [facility], [address] [place], [place] [address]  Urinalysis, Complete w Microscopic     Status: Abnormal   Collection Time: 01/09/21  5:30 AM  Result Value Ref Range   Color, Urine AMBER (A) YELLOW    Comment: BIOCHEMICALS MAY BE AFFECTED BY COLOR   APPearance CLOUDY (A) CLEAR   Specific Gravity, Urine 1.012 1.005 -  1.030   pH 6.0 5.0 - 8.0   Glucose, UA NEGATIVE NEGATIVE mg/dL   Hgb urine dipstick SMALL (A) NEGATIVE   Bilirubin Urine MODERATE (A) NEGATIVE   Ketones, ur NEGATIVE NEGATIVE mg/dL   Protein, ur 30 (A) NEGATIVE mg/dL   Nitrite NEGATIVE NEGATIVE   Leukocytes,Ua LARGE (A) NEGATIVE   RBC / HPF 0-5 0 - 5 RBC/hpf   WBC, UA >50 (H) 0 - 5 WBC/hpf   Bacteria, UA RARE (A) NONE SEEN   Squamous Epithelial / LPF 6-10 0 - 5   WBC Clumps PRESENT     Comment: Performed at [facility], [address] [place], [place] [address]  Glucose, capillary     Status: Abnormal   Collection Time: 01/09/21  8:25 AM  Result Value Ref Range   Glucose-Capillary 187 (H) 70 - 99 mg/dL    Comment: Glucose reference range applies only to samples taken  after fasting for at least 8 hours.   No components found for: ESR, C REACTIVE PROTEIN MICRO: Recent Results (from the past 720 hour(s))  Resp Panel by RT-PCR (Flu A&B, Covid) Nasopharyngeal Swab     Status: None   Collection Time: 12/18/20  1:16 AM   Specimen: Nasopharyngeal Swab; Nasopharyngeal(NP) swabs in vial transport medium  Result Value Ref Range Status   SARS Coronavirus 2 by RT PCR NEGATIVE NEGATIVE Final    Comment: (NOTE) SARS-CoV-2 target nucleic acids are NOT DETECTED.  The SARS-CoV-2 RNA is generally detectable in upper respiratory specimens during the acute phase of infection. The lowest concentration of SARS-CoV-2 viral copies this assay can detect is 138 copies/mL. A negative result does not preclude SARS-Cov-2 infection and should not be used as the sole basis for treatment or other patient management decisions. A negative result may occur with  improper specimen collection/handling, submission of specimen other than nasopharyngeal swab, presence of viral mutation(s) within the areas targeted by this assay, and inadequate number of viral copies(<138 copies/mL). A negative result must be combined with clinical observations, patient history, and epidemiological information. The expected result is Negative.  Fact Sheet for Patients:  EntrepreneurPulse.com.au  Fact Sheet for Healthcare Providers:  IncredibleEmployment.be  This test is no t yet approved or cleared by the [place] FDA and  has been authorized for detection and/or diagnosis of SARS-CoV-2 by FDA under an Emergency Use Authorization (EUA). This EUA will remain  in effect (meaning this test can be used) for the duration of the COVID-19 declaration under Section 564(b)(1) of the Act, 21 U.S.C.section 360bbb-3(b)(1), unless the authorization is terminated  or revoked sooner.       Influenza A by PCR NEGATIVE NEGATIVE Final   Influenza B by PCR NEGATIVE NEGATIVE  Final    Comment: (NOTE) The Xpert Xpress SARS-CoV-2/FLU/RSV plus assay is intended as an aid in the diagnosis of influenza from Nasopharyngeal swab specimens and should not be used as a sole basis for treatment. Nasal washings and aspirates are unacceptable for Xpert Xpress SARS-CoV-2/FLU/RSV testing.  Fact Sheet for Patients: EntrepreneurPulse.com.au  Fact Sheet for Healthcare Providers: IncredibleEmployment.be  This test is not yet approved or cleared by the [place] FDA and has been authorized for detection and/or diagnosis of SARS-CoV-2 by FDA under an Emergency Use Authorization (EUA). This EUA will remain in effect (meaning this test can be used) for the duration of the COVID-19 declaration under Section 564(b)(1) of the Act, 21 U.S.C. section 360bbb-3(b)(1), unless the authorization is terminated or revoked.  Performed at [facility], [address] [place], [place] [address]   Blood culture (routine x 2)     Status: None   Collection Time: 12/18/20  1:16 AM   Specimen: BLOOD  Result Value Ref Range Status   Specimen Description BLOOD RIGHT HAND  Final   Special Requests   Final    BOTTLES DRAWN AEROBIC AND ANAEROBIC Blood Culture results may not be optimal due to an inadequate volume of blood received in culture bottles   Culture   Final    NO GROWTH 5 DAYS Performed at [facility], [address] [place], [place] [address]    Report Status 12/23/2020 FINAL  Final  Blood culture (routine x 2)     Status: None   Collection Time: 12/18/20  1:16 AM   Specimen: BLOOD  Result Value Ref Range Status   Specimen Description BLOOD RIGHT ASSIST CONTROL  Final   Special Requests   Final    BOTTLES DRAWN AEROBIC AND ANAEROBIC Blood Culture adequate volume   Culture   Final    NO GROWTH 5 DAYS Performed at [facility], [address], [place], [place] [address]    Report Status 12/23/2020 FINAL   Final  Varicella-zoster by PCR (Blood or Swab)     Status: None   Collection Time: 12/19/20  3:47 PM   Specimen: Blood  Result Value Ref Range Status   Varicella-Zoster, PCR Negative Negative Final    Comment: (NOTE) No Varicella Zoster Virus DNA detected. This test was developed and its performance characteristics determined by [facility].  It has not been cleared or approved by the Food and Drug Administration.  The FDA has determined that such clearance or approval is not necessary. Performed At: [facility] [place], [place] [id number] Rush Farmer MD [id number]   MRSA Next Gen by PCR, Nasal     Status: Abnormal   Collection Time: 12/19/20  4:04 PM   Specimen: Nasal Mucosa; Nasal Swab  Result Value Ref Range Status   MRSA by PCR Next Gen DETECTED (A) NOT DETECTED Final    Comment: RESULT CALLED TO, READ BACK BY AND VERIFIED WITH: AMY DOLTON $[email or web address]$  ON 12/19/20 SKL (NOTE) The GeneXpert MRSA Assay (FDA approved for NASAL specimens only), is one component of a comprehensive MRSA colonization surveillance program. It is not intended to diagnose MRSA infection nor to guide or monitor treatment for MRSA infections. Test performance is not FDA approved in patients less than 63 years old. Performed at [facility], [address], [place], [place] [address]   Body fluid culture w Gram Stain     Status: None   Collection Time: 12/30/20  9:45 AM   Specimen: PATH Cytology Peritoneal fluid  Result Value Ref Range Status   Specimen Description   Final    PERITONEAL Performed at [facility], [address] [place], [place] [address]    Special Requests   Final    NONE Performed at [facility], [address] [place], [place] [address]    Gram Stain   Final    WBC PRESENT,BOTH PMN AND MONONUCLEAR NO ORGANISMS SEEN CYTOSPIN SMEAR    Culture   Final    NO GROWTH 3 DAYS Performed at [facility], [place] [address],  [place], [place] [address]    Report Status 01/02/2021 FINAL  Final  Cath Tip Culture     Status: None   Collection Time: 01/04/21  1:59 PM  Specimen: Catheter Tip; Other  Result Value Ref Range Status   Specimen Description   Final    CATH TIP Performed at [facility], [address], [place], [place] [address]    Special Requests   Final    NONE Performed at [facility], [address], [place], [place] [address]    Culture   Final    NO GROWTH 2 DAYS Performed at [facility], [place] [address], [place], [place] [address]    Report Status 01/07/2021 FINAL  Final  Culture, blood (single) w Reflex to ID Panel     Status: None   Collection Time: 01/04/21  2:00 PM   Specimen: BLOOD  Result Value Ref Range Status   Specimen Description BLOOD ALIN  Final   Special Requests   Final    BOTTLES DRAWN AEROBIC AND ANAEROBIC Blood Culture results may not be optimal due to an excessive volume of blood received in culture bottles   Culture   Final    NO GROWTH 5 DAYS Performed at [facility], [address], [place], [place] [address]    Report Status 01/09/2021 FINAL  Final  Culture, blood (single) w Reflex to ID Panel     Status: None   Collection Time: 01/04/21  2:35 PM   Specimen: BLOOD  Result Value Ref Range Status   Specimen Description BLOOD LEFT ANTECUBITAL  Final   Special Requests   Final    BOTTLES DRAWN AEROBIC AND ANAEROBIC Blood Culture adequate volume   Culture   Final    NO GROWTH 5 DAYS Performed at [facility], [address] [place], [place] [address]    Report Status 01/09/2021 FINAL  Final  Body fluid culture w Gram Stain     Status: None   Collection Time: 01/06/21 10:55 AM   Specimen: PATH Cytology Peritoneal fluid  Result Value Ref Range Status   Specimen Description   Final    PERITONEAL Performed at [facility], [address], [place], [place] [address]    Special Requests   Final     NONE Performed at [facility], [address] [place], [place] [address]    Gram Stain   Final    FEW WBC PRESENT, PREDOMINANTLY MONONUCLEAR NO ORGANISMS SEEN    Culture   Final    NO GROWTH 3 DAYS Performed at [facility], [place] [address], [place], [place] [address]    Report Status 01/09/2021 FINAL  Final    IMAGING: DG Chest 2 View  Result Date: 01/05/2021 CLINICAL DATA:  Aspiration pneumonia. EXAM: CHEST - 2 VIEW COMPARISON:  January 02, 2021. FINDINGS: Stable cardiomediastinal silhouette. Stable elevated right hemidiaphragm is noted with associated right basilar atelectasis and effusion. Mild left basilar subsegmental atelectasis is noted. Right-sided PICC line is unchanged in position. Bony thorax is unremarkable. IMPRESSION: Stable elevated right hemidiaphragm is noted with associated right basilar atelectasis and effusion. Mild left basilar subsegmental atelectasis is noted. Electronically Signed   By: Marijo Conception M.D.   On: 01/05/2021 17:08   CT HEAD WO CONTRAST (5MM)  Result Date: 12/17/2020 CLINICAL DATA:  Patient found down. EXAM: CT HEAD WITHOUT CONTRAST TECHNIQUE: Contiguous axial images were obtained from the base of the skull through the vertex without intravenous contrast. COMPARISON:  None. FINDINGS: Brain: No evidence of acute infarction, hemorrhage, hydrocephalus, extra-axial collection or mass lesion/mass effect. Vascular: No hyperdense vessel or unexpected calcification. Skull: Normal. Negative for  fracture or focal lesion. Sinuses/Orbits: No acute finding. Other: None. IMPRESSION: No acute intracranial pathology. Electronically Signed   By: Virgina Norfolk M.D.   On: 12/17/2020 23:51   CT Angio Chest Pulmonary Embolism (PE) W or WO Contrast  Result Date: 12/29/2020 CLINICAL DATA:  Inpatient.  Dyspnea.  Abnormal chest radiograph. EXAM: CT ANGIOGRAPHY CHEST WITH CONTRAST TECHNIQUE: Multidetector CT imaging of the chest was performed using the  standard protocol during bolus administration of intravenous contrast. Multiplanar CT image reconstructions and MIPs were obtained to evaluate the vascular anatomy. CONTRAST:  46mL OMNIPAQUE IOHEXOL 350 MG/ML SOLN COMPARISON:  Chest radiograph from one day prior. FINDINGS: Cardiovascular: The study is high quality for the evaluation of pulmonary embolism. There are no filling defects in the central, lobar, segmental or subsegmental pulmonary artery branches to suggest acute pulmonary embolism. Normal course and caliber of the thoracic aorta. Top-normal caliber main pulmonary artery (3.1 cm diameter). Top-normal heart size. No significant pericardial fluid/thickening. Mediastinum/Nodes: No discrete thyroid nodules. Unremarkable esophagus. No pathologically enlarged axillary, mediastinal or hilar lymph nodes. Lungs/Pleura: No pneumothorax. Small dependent right pleural effusion. No left pleural effusion. Moderate elevation of the right hemidiaphragm. Complete right lower lobe and near complete right middle lobe atelectasis. Moderate dependent left lower lobe and lingular atelectasis. No lung masses or significant pulmonary nodules in the aerated portions of the lungs. Upper abdomen: Small volume perihepatic ascites. Diffuse hepatic steatosis. Musculoskeletal: No aggressive appearing focal osseous lesions. Minimal thoracic spondylosis. Review of the MIP images confirms the above findings. IMPRESSION: 1. No pulmonary embolism. 2. Moderate elevation of the right hemidiaphragm. Complete right lower lobe and near complete right middle lobe atelectasis. Moderate dependent left lung base atelectasis. 3. Small dependent right pleural effusion. 4. Small volume perihepatic ascites. 5. Diffuse hepatic steatosis. Electronically Signed   By: Ilona Sorrel M.D.   On: 12/29/2020 18:28   [place] CHEST (PLEURAL EFFUSION)  Result Date: 12/29/2020 CLINICAL DATA:  Evaluate right pleural effusion for thoracentesis. EXAM: CHEST ULTRASOUND  COMPARISON:  Chest radiograph 12/28/2020 FINDINGS: Minimal fluid in the right pleural space. IMPRESSION: 1. Minimal fluid in the right pleural space. Thoracentesis not performed due to the small amount of pleural fluid. 2. Opacities in the right lower chest on recent chest radiograph probably represent a combination of elevation the right hemidiaphragm and atelectasis. Electronically Signed   By: Markus Daft M.D.   On: 12/29/2020 12:25   MR LIVER W WO CONTRAST  Result Date: 12/23/2020 CLINICAL DATA:  A 45 year old female presents with jaundice and suspected hepatic encephalopathy. EXAM: MRI ABDOMEN WITHOUT AND WITH CONTRAST TECHNIQUE: Multiplanar multisequence MR imaging of the abdomen was performed both before and after the administration of intravenous contrast. CONTRAST:  79mL GADAVIST GADOBUTROL 1 MMOL/ML IV SOLN COMPARISON:  Comparison made with abdominal sonogram which was performed on October second of 2022 and hepatic Doppler performed on December 20, 2020. FINDINGS: Lower chest: RIGHT effusion and basilar airspace disease, not well assessed in the setting of elevation of the RIGHT hemidiaphragm likely at least partially secondary to marked hepatomegaly. Small LEFT effusion. Lung base assessment is limited on MRI. Hepatobiliary: Marked hepatomegaly and marked hepatic steatosis. Dedicated MRCP images were not performed though there is no sign of intra or extrahepatic biliary duct distension. Gallbladder is collapsed. Portal and hepatic veins are patent. Pancreas: Normal intrinsic T1 signal. No ductal dilation or sign of inflammation. No focal lesion. Spleen:  Spleen normal in size and contour. Adrenals/Urinary Tract: Adrenal glands are normal. No suspicious renal lesion or  hydronephrosis. No perinephric stranding. Stomach/Bowel: Gastrointestinal tract shows no acute process on limited assessment to the extent evaluated on abdominal MRI. Vascular/Lymphatic: No pathologically enlarged lymph nodes identified.  No abdominal aortic aneurysm demonstrated. Other:  Small volume ascites. Musculoskeletal: No suspicious bone lesions identified. Marked muscular atrophy in this patient with history of muscular dystrophy. IMPRESSION: Marked hepatomegaly and marked hepatic steatosis. Lobular hepatic contours and marked hepatomegaly with liver span exceeding 28 cm. In the setting of ascites would correlate with any signs of steatohepatitis. No current evidence of portal hypertension. No acute biliary process. Note the dedicated MRCP images were not performed though there is no biliary duct dilation and the gallbladder is completely collapsed. No biliary duct distension. Basilar effusions and bibasilar airspace disease RIGHT greater than LEFT. Cardiomegaly not well assessed. Electronically Signed   By: Zetta Bills M.D.   On: 12/23/2020 14:59   US Paracentesis  Result Date: 01/06/2021 INDICATION: Ascites due to alcoholic hepatitis. EXAM: ULTRASOUND GUIDED  PARACENTESIS MEDICATIONS: None. COMPLICATIONS: None immediate. PROCEDURE: Informed written consent was obtained from the patient after a discussion of the risks, benefits and alternatives to treatment. A timeout was performed prior to the initiation of the procedure. Initial ultrasound scanning demonstrates a moderate amount of ascites within the right lower abdominal quadrant. The right lower abdomen was prepped and draped in the usual sterile fashion. 1% lidocaine was used for local anesthesia. Following this, a 19 gauge, 10-cm, Yueh catheter was introduced. An ultrasound image was saved for documentation purposes. The paracentesis was performed. The catheter was removed and a dressing was applied. The patient tolerated the procedure well without immediate post procedural complication. FINDINGS: A total of approximately 750 mL of yellow fluid was removed. Samples were sent to the laboratory as requested by the clinical team. IMPRESSION: Successful ultrasound-guided  paracentesis yielding 750 mL of peritoneal fluid. Electronically Signed   By: Markus Daft M.D.   On: 01/06/2021 13:15   US Paracentesis  Result Date: 12/30/2020 INDICATION: Symptomatic ascites EXAM: ULTRASOUND-GUIDED DIAGNOSTIC PARACENTESIS COMPARISON:  CTA PE, 12/29/2020. MR abdomen, 12/23/2020. Abdominal ultrasound, 12/18/2020., MEDICATIONS: None. COMPLICATIONS: None immediate. TECHNIQUE: Informed written consent was obtained from the patient after a discussion of the risks, benefits and alternatives to treatment. A timeout was performed prior to the initiation of the procedure. Initial ultrasound scanning demonstrates a moderate amount of ascites within the right upper abdominal quadrant. The right upper abdomen was prepped and draped in the usual sterile fashion. 1% lidocaine with epinephrine was used for local anesthesia. An ultrasound image was saved for documentation purposed. An 8 Fr Safe-T-Centesis catheter was introduced. The paracentesis was performed. The catheter was removed and a dressing was applied. The patient tolerated the procedure well without immediate post procedural complication. FINDINGS: A total of approximately 2.2 liters of bilious ascitic fluid was removed. Samples were sent to the laboratory as requested by the clinical team. IMPRESSION: Successful ultrasound-guided paracentesis yielding 2.2 liters of bilious ascitic fluid. Michaelle Birks, MD [facility] Radiology Electronically Signed   By: Michaelle Birks M.D.   On: 12/30/2020 10:08   IR Fluoro Guide CV Line Right  Result Date: 12/30/2020 INDICATION: Poor IV access. EXAM: ULTRASOUND AND FLUOROSCOPIC GUIDED PLACEMENT OF TUNNELED CENTRAL VENOUS CATHETER MEDICATIONS: None. ANESTHESIA/SEDATION: Local anesthetic was administered. FLUOROSCOPY TIME:  36 seconds (6.5 mGy) COMPLICATIONS: None immediate. PROCEDURE: Informed written consent was obtained from the patient after a discussion of the  risks, benefits, and alternatives to treatment. Questions regarding the procedure were encouraged  and answered. The right neck and chest were prepped with chlorhexidine in a sterile fashion, and a sterile drape was applied covering the operative field. Maximum barrier sterile technique with sterile gowns and gloves were used for the procedure. A timeout was performed prior to the initiation of the procedure. After the overlying soft tissues were anesthetized, a small venotomy incision was created and a micropuncture kit was utilized to access the internal jugular vein. Real-time ultrasound guidance was utilized for vascular access including the acquisition of a permanent ultrasound image documenting patency of the accessed vessel. The microwire was utilized to measure appropriate catheter length. The micropuncture sheath was exchanged for a peel-away sheath over a guidewire. A 5 [place] dual lumen tunneled central venous catheter measuring 25 cm was tunneled in a retrograde fashion from the anterior chest wall to the venotomy incision. The catheter was then placed through the peel-away sheath with tip ultimately positioned at the RIGHT atrium. Final catheter positioning was confirmed and documented with a spot radiographic image. The catheter aspirates and flushes normally. The catheter was flushed with appropriate volume heparin dwells. The catheter exit site was secured with a 2-0 nylon retention suture. The venotomy incision was closed with Dermabond. Dressings were applied. The patient tolerated the procedure well without immediate post procedural complication. FINDINGS: After catheter placement, the tip lies within the RIGHT atrium the catheter aspirates and flushes normally and is ready for immediate use. IMPRESSION: Successful placement of 25 cm dual lumen tunneled open "Power Line" central venous catheter via the right internal jugular vein with tip terminating in the RIGHT atrium. The catheter is ready for  immediate use. Michaelle Birks, MD [facility] Radiology Electronically Signed   By: Michaelle Birks M.D.   On: 12/30/2020 18:05   IR US Guide Vasc Access Right  Result Date: 12/30/2020 INDICATION: Poor IV access. EXAM: ULTRASOUND AND FLUOROSCOPIC GUIDED PLACEMENT OF TUNNELED CENTRAL VENOUS CATHETER MEDICATIONS: None. ANESTHESIA/SEDATION: Local anesthetic was administered. FLUOROSCOPY TIME:  36 seconds (6.5 mGy) COMPLICATIONS: None immediate. PROCEDURE: Informed written consent was obtained from the patient after a discussion of the risks, benefits, and alternatives to treatment. Questions regarding the procedure were encouraged and answered. The right neck and chest were prepped with chlorhexidine in a sterile fashion, and a sterile drape was applied covering the operative field. Maximum barrier sterile technique with sterile gowns and gloves were used for the procedure. A timeout was performed prior to the initiation of the procedure. After the overlying soft tissues were anesthetized, a small venotomy incision was created and a micropuncture kit was utilized to access the internal jugular vein. Real-time ultrasound guidance was utilized for vascular access including the acquisition of a permanent ultrasound image documenting patency of the accessed vessel. The microwire was utilized to measure appropriate catheter length. The micropuncture sheath was exchanged for a peel-away sheath over a guidewire. A 5 [place] dual lumen tunneled central venous catheter measuring 25 cm was tunneled in a retrograde fashion from the anterior chest wall to the venotomy incision. The catheter was then placed through the peel-away sheath with tip ultimately positioned at the RIGHT atrium. Final catheter positioning was confirmed and documented with a spot radiographic image. The catheter aspirates and flushes normally. The catheter was flushed with appropriate volume heparin dwells. The  catheter exit site was secured with a 2-0 nylon retention suture. The venotomy incision was closed with Dermabond. Dressings were applied. The patient tolerated the procedure well without immediate post procedural complication. FINDINGS: After  catheter placement, the tip lies within the RIGHT atrium the catheter aspirates and flushes normally and is ready for immediate use. IMPRESSION: Successful placement of 25 cm dual lumen tunneled open "Power Line" central venous catheter via the right internal jugular vein with tip terminating in the RIGHT atrium. The catheter is ready for immediate use. Michaelle Birks, MD [facility] Radiology Electronically Signed   By: Michaelle Birks M.D.   On: 12/30/2020 18:05   DG Chest Port 1 View  Result Date: 01/02/2021 CLINICAL DATA:  Aspiration pneumonia EXAM: PORTABLE CHEST 1 VIEW COMPARISON:  12/28/2020 FINDINGS: Stable complete obscuration of the right heart border due to a combination of elevation of the right hemidiaphragm, atelectasis, possibly underlying pleural effusion as shown on the CT scan from 4 days ago. Mildly improved aeration at the left lung base although some hazy density in the left hemithorax persists and there is some linear bandlike opacities in the left perihilar region. Some of the relative density of the left hemithorax compared to the right is also due to distribution of breast and chest wall tissues during imaging. Right internal jugular central venous catheter tip: SVC. No pneumothorax identified. IMPRESSION: 1. Mildly improved aeration at the left lung base compared to 01/28/2021. Otherwise stable appearance with elevated right hemidiaphragm, atelectasis, and likely underlying right pleural effusion obscuring the right heart border. 2. Right IJ central line tip: SVC. Electronically Signed   By: Van Clines M.D.   On: 01/02/2021 10:09   DG Chest Port 1 View  Result Date: 12/28/2020 CLINICAL DATA:   Dyspnea EXAM: PORTABLE CHEST 1 VIEW COMPARISON:  12/17/2020 FINDINGS: Mild progression of moderately large right pleural effusion. There is associated collapse or consolidation in the right lung base. Decreased lung volume compared to the prior study. Increase in left lower lobe atelectasis. No edema. IMPRESSION: Hypoventilation Progressive moderately large right pleural effusion and right lower lobe atelectasis. Progressive mild left lower lobe atelectasis. Electronically Signed   By: Franchot Gallo M.D.   On: 12/28/2020 18:07   DG Chest Portable 1 View  Result Date: 12/17/2020 CLINICAL DATA:  Hypoxia EXAM: PORTABLE CHEST 1 VIEW COMPARISON:  11/16/2020 FINDINGS: Cardiac shadow is stable. Left lung remains clear. Elevation of the right hemidiaphragm is noted. Right basilar effusion and underlying atelectasis/infiltrate is present. IMPRESSION: New right basilar infiltrate and effusion. Electronically Signed   By: Inez Catalina M.D.   On: 12/17/2020 23:42   ECHOCARDIOGRAM COMPLETE  Result Date: 12/29/2020    ECHOCARDIOGRAM REPORT   Patient Name:   [name] Date of Exam: 12/29/2020 Medical Rec #:  [id number]     Height:       67.0 in Accession #:    [id number]    Weight:       256.6 lb Date of Birth:  1975-04-30     BSA:          2.248 m Patient Age:    44 years      BP:           113/74 mmHg Patient Gender: F             HR:           108 bpm. Exam Location:  [facility] Procedure: 2D Echo, Cardiac Doppler and Color Doppler Indications:     Dyspnea R06.00  History:         Patient has no prior history of Echocardiogram examinations.  Signs/Symptoms:Anxiety; Risk Factors:Diabetes.  Sonographer:     Sherrie Sport Referring Phys:  [id number] Val Riles Diagnosing Phys: Ida Rogue MD  Sonographer Comments: No apical window and no subcostal window. Pt cant lie on back nor left side. IMPRESSIONS  1. Left ventricular ejection fraction, by estimation, is 60 to 65%. The left ventricle has normal function.  The left ventricle has no regional wall motion abnormalities. Left ventricular diastolic parameters are indeterminate.  2. Right ventricular systolic function is normal. The right ventricular size is normal. There is severely elevated pulmonary artery systolic pressure. The estimated right ventricular systolic pressure is 34.3 mmHg.  3. The mitral valve is normal in structure. No evidence of mitral valve regurgitation. No evidence of mitral stenosis. FINDINGS  Left Ventricle: Left ventricular ejection fraction, by estimation, is 60 to 65%. The left ventricle has normal function. The left ventricle has no regional wall motion abnormalities. The left ventricular internal cavity size was normal in size. There is  no left ventricular hypertrophy. Left ventricular diastolic parameters are indeterminate. Right Ventricle: The right ventricular size is normal. No increase in right ventricular wall thickness. Right ventricular systolic function is normal. There is severely elevated pulmonary artery systolic pressure. The tricuspid regurgitant velocity is 3.79 m/s, and with an assumed right atrial pressure of 5 mmHg, the estimated right ventricular systolic pressure is 56.8 mmHg. Left Atrium: Left atrial size was normal in size. Right Atrium: Right atrial size was normal in size. Pericardium: There is no evidence of pericardial effusion. Mitral Valve: The mitral valve is normal in structure. No evidence of mitral valve regurgitation. No evidence of mitral valve stenosis. Tricuspid Valve: The tricuspid valve is normal in structure. Tricuspid valve regurgitation is mild . No evidence of tricuspid stenosis. Aortic Valve: The aortic valve was not well visualized. Aortic valve regurgitation is not visualized. No aortic stenosis is present. Pulmonic Valve: The pulmonic valve was normal in structure. Pulmonic valve regurgitation is not visualized. No evidence of pulmonic stenosis. Aorta: The aortic root is normal in size and  structure. Venous: The pulmonary veins were not well visualized. The inferior vena cava was not well visualized. The inferior vena cava is normal in size with greater than 50% respiratory variability, suggesting right atrial pressure of 3 mmHg. IAS/Shunts: No atrial level shunt detected by color flow Doppler.  LEFT VENTRICLE PLAX 2D LVIDd:         4.90 cm LVIDs:         3.10 cm LV PW:         1.20 cm LV IVS:        0.90 cm LVOT diam:     2.00 cm LVOT Area:     3.14 cm  LEFT ATRIUM         Index LA diam:    3.60 cm 1.60 cm/m                        PULMONIC VALVE AORTA                 PV Vmax:        0.91 m/s Ao Root diam: 2.90 cm PV Peak grad:   3.3 mmHg                       RVOT Peak grad: 3 mmHg  TRICUSPID VALVE TR Peak grad:   57.5 mmHg TR Vmax:        379.00 cm/s  SHUNTS  Systemic Diam: 2.00 cm Ida Rogue MD Electronically signed by Ida Rogue MD Signature Date/Time: 12/29/2020/1:27:00 PM    Final    US LIVER DOPPLER  Result Date: 12/20/2020 CLINICAL DATA:  Abnormal liver function tests. EXAM: DUPLEX ULTRASOUND OF LIVER TECHNIQUE: Color and duplex Doppler ultrasound was performed to evaluate the hepatic in-flow and out-flow vessels. COMPARISON:  December 18, 2020. FINDINGS: Liver: Increased parenchymal echogenicity. Normal hepatic contour without nodularity. No focal lesion, mass or intrahepatic biliary ductal dilatation. Main Portal Vein size: 0.9 cm Portal Vein Velocities Main Prox:  29.4 cm/sec Main Mid: 24.8 cm/sec Main Dist:  22.2 cm/sec Right: 20.6 cm/sec Left: 19.0 cm/sec Normal hepatopetal flow is noted in the portal veins. Hepatic Vein Velocities Right:  Not visualized. Middle:  Not visualized. Left:  Not visualized. IVC: Present and patent with normal respiratory phasicity. Hepatic Artery Velocity:  48.1 cm/sec Splenic Vein Velocity:  10.1 cm/sec Spleen: 9.0 cm x 5.0 cm x 11.2 cm with a total volume of 266 cm^3 (411 cm^3 is upper limit normal) Portal Vein Occlusion/Thrombus: No Splenic Vein  Occlusion/Thrombus: No Ascites: Present. Varices: None Limited exam due to body habitus and bowel gas. IMPRESSION: Limited exam due to body habitus and overlying bowel gas. There is no definite evidence of portal or splenic venous thrombosis. Due to above described limitations, hepatic veins are not visualized. Minimal to mild ascites is noted. Electronically Signed   By: Marijo Conception M.D.   On: 12/20/2020 12:10   [place] EKG SITE RITE  Result Date: 12/30/2020 If Site Rite image not attached, placement could not be confirmed due to current cardiac rhythm.  US ABDOMEN LIMITED RUQ (LIVER/GB)  Result Date: 12/18/2020 CLINICAL DATA:  Elevated liver function tests. EXAM: ULTRASOUND ABDOMEN LIMITED RIGHT UPPER QUADRANT COMPARISON:  None. FINDINGS: Gallbladder: No gallstones or wall thickening visualized (2.7 mm). No sonographic Murphy sign noted by sonographer. Common bile duct: Diameter: 2.3 mm Liver: No focal lesion identified. Diffusely increased echogenicity of the liver parenchyma is noted. Portal vein is patent on color Doppler imaging with normal direction of blood flow towards the liver. Other: A small amount of ascites is seen within the right upper quadrant. IMPRESSION: 1. Hepatic steatosis. 2. Small amount of right upper quadrant ascites. Electronically Signed   By: Virgina Norfolk M.D.   On: 12/18/2020 02:03    Assessment:   DANYELA POSAS is a 45 y.o. female with a complicated 3 week admission with complications of ESLD, falls, metabolic abnormalities and possible PNA. She was eval for SBP and then  treated for PNA. Clinically improving but having elevation in her WBC.  She was having diarrhea but that resoled after stopping lactulose.  Was empirically started on vancomycin.  Currently relatively stable but UA with  > 50 wbc Suspect she likely has a UTI.  Urine cultures pending.   Recommendations Start cefepime for UTI. Consider imaging of her renal system if fevers or leukocytosis  persist. Stop oral vanco.  Thank you very much for allowing me to participate in the care of this patient. Please call with questions.   Cheral Marker. Ola Spurr, MD

## 2021-01-10 ENCOUNTER — Inpatient Hospital Stay: Payer: Medicare Other

## 2021-01-10 DIAGNOSIS — N390 Urinary tract infection, site not specified: Secondary | ICD-10-CM

## 2021-01-10 DIAGNOSIS — K7682 Hepatic encephalopathy: Secondary | ICD-10-CM | POA: Diagnosis not present

## 2021-01-10 DIAGNOSIS — A419 Sepsis, unspecified organism: Secondary | ICD-10-CM | POA: Diagnosis not present

## 2021-01-10 DIAGNOSIS — D72829 Elevated white blood cell count, unspecified: Secondary | ICD-10-CM | POA: Diagnosis not present

## 2021-01-10 DIAGNOSIS — K7011 Alcoholic hepatitis with ascites: Secondary | ICD-10-CM

## 2021-01-10 DIAGNOSIS — F1020 Alcohol dependence, uncomplicated: Secondary | ICD-10-CM | POA: Diagnosis not present

## 2021-01-10 LAB — CBC WITH DIFFERENTIAL/PLATELET
Abs Immature Granulocytes: 1.14 10*3/uL — ABNORMAL HIGH (ref 0.00–0.07)
Basophils Absolute: 0.1 10*3/uL (ref 0.0–0.1)
Basophils Relative: 0 %
Eosinophils Absolute: 0.2 10*3/uL (ref 0.0–0.5)
Eosinophils Relative: 1 %
HCT: 26.9 % — ABNORMAL LOW (ref 36.0–46.0)
Hemoglobin: 8.4 g/dL — ABNORMAL LOW (ref 12.0–15.0)
Immature Granulocytes: 4 %
Lymphocytes Relative: 8 %
Lymphs Abs: 2.4 10*3/uL (ref 0.7–4.0)
MCH: 36.1 pg — ABNORMAL HIGH (ref 26.0–34.0)
MCHC: 31.2 g/dL (ref 30.0–36.0)
MCV: 115.5 fL — ABNORMAL HIGH (ref 80.0–100.0)
Monocytes Absolute: 1.2 10*3/uL — ABNORMAL HIGH (ref 0.1–1.0)
Monocytes Relative: 4 %
Neutro Abs: 24.7 10*3/uL — ABNORMAL HIGH (ref 1.7–7.7)
Neutrophils Relative %: 83 %
Platelets: 305 10*3/uL (ref 150–400)
RBC: 2.33 MIL/uL — ABNORMAL LOW (ref 3.87–5.11)
RDW: 19.3 % — ABNORMAL HIGH (ref 11.5–15.5)
Smear Review: NORMAL
WBC: 29.7 10*3/uL — ABNORMAL HIGH (ref 4.0–10.5)
nRBC: 0.1 % (ref 0.0–0.2)

## 2021-01-10 LAB — GLUCOSE, CAPILLARY
Glucose-Capillary: 254 mg/dL — ABNORMAL HIGH (ref 70–99)
Glucose-Capillary: 231 mg/dL — ABNORMAL HIGH (ref 70–99)
Glucose-Capillary: 290 mg/dL — ABNORMAL HIGH (ref 70–99)
Glucose-Capillary: 238 mg/dL — ABNORMAL HIGH (ref 70–99)

## 2021-01-10 LAB — COMPREHENSIVE METABOLIC PANEL
ALT: 29 U/L (ref 0–44)
AST: 139 U/L — ABNORMAL HIGH (ref 15–41)
Albumin: <1.5 g/dL — ABNORMAL LOW (ref 3.5–5.0)
Alkaline Phosphatase: 239 U/L — ABNORMAL HIGH (ref 38–126)
Anion gap: 7 (ref 5–15)
BUN: 20 mg/dL (ref 6–20)
CO2: 35 mmol/L — ABNORMAL HIGH (ref 22–32)
Calcium: 8.4 mg/dL — ABNORMAL LOW (ref 8.9–10.3)
Chloride: 91 mmol/L — ABNORMAL LOW (ref 98–111)
Creatinine, Ser: 0.55 mg/dL (ref 0.44–1.00)
GFR, Estimated: >60 mL/min (ref >60)
Glucose, Bld: 264 mg/dL — ABNORMAL HIGH (ref 70–99)
Potassium: 3.8 mmol/L (ref 3.5–5.1)
Sodium: 133 mmol/L — ABNORMAL LOW (ref 135–145)
Total Bilirubin: 9.3 mg/dL — ABNORMAL HIGH (ref 0.3–1.2)
Total Protein: 6.4 g/dL — ABNORMAL LOW (ref 6.5–8.1)

## 2021-01-10 LAB — AMMONIA: Ammonia: 43 umol/L — ABNORMAL HIGH (ref 9–35)

## 2021-01-10 LAB — PROTEIN, BODY FLUID (OTHER): Total Protein, Body Fluid Other: 1.7 g/dL

## 2021-01-10 LAB — MAGNESIUM: Magnesium: 1.6 mg/dL — ABNORMAL LOW (ref 1.7–2.4)

## 2021-01-10 LAB — URINE CULTURE: Culture: 30000 — AB

## 2021-01-10 MED ORDER — CARIPRAZINE HCL 1.5 MG PO CAPS
1.5000 mg | ORAL_CAPSULE | Freq: Every day | ORAL | Status: DC
  Administered 2021-01-10: 1.5 mg via ORAL
  Filled 2021-01-10 (×2): qty 1

## 2021-01-10 MED ORDER — RIFAXIMIN 550 MG PO TABS
550.0000 mg | ORAL_TABLET | Freq: Two times a day (BID) | ORAL | Status: DC
  Administered 2021-01-10 – 2021-01-26 (×33): 550 mg via ORAL
  Filled 2021-01-10 (×34): qty 1

## 2021-01-10 MED ORDER — OLANZAPINE 5 MG PO TABS
5.0000 mg | ORAL_TABLET | Freq: Every day | ORAL | Status: DC
  Administered 2021-01-10 – 2021-01-25 (×16): 5 mg via ORAL
  Filled 2021-01-10 (×17): qty 1

## 2021-01-10 MED ORDER — MAGNESIUM SULFATE 4 GM/100ML IV SOLN
4.0000 g | Freq: Once | INTRAVENOUS | Status: AC
  Administered 2021-01-10: 4 g via INTRAVENOUS
  Filled 2021-01-10: qty 100

## 2021-01-10 NOTE — Consult Note (Signed)
[facility] Face-to-Face Psychiatry Consult   Reason for Consult: Consult with this 45 year old woman with a history of bipolar 2 depression and alcohol and other substance abuse Referring Physician: Chipper Herb Patient Identification: [name] MRN:  [id number] Principal Diagnosis: Alcohol use disorder, moderate, dependence ([name]) Diagnosis:  Principal Problem:   Alcohol use disorder, moderate, dependence ([name]) Active Problems:   Alcohol withdrawal ([name])   Bipolar 2 disorder ([name])   Hypokalemia   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[name])   Acute hyponatremia   Hepatic steatosis   Polysubstance abuse ([name])   Hyperglycemia due to type 2 diabetes mellitus ([name])   Hyperbilirubinemia   Lactic acidosis   Alcoholic liver disease ([name])   Sepsis ([name])   Acute hepatic encephalopathy   Pressure injury of skin   Aspiration pneumonia of both lower lobes due to gastric secretions ([name])   Total Time spent with patient: 1 hour  Subjective:   [name] is a 45 y.o. female patient admitted with "I had just given up".  HPI: Patient seen chart reviewed.  45 year old woman with a history of alcohol abuse as well as some cocaine abuse.  Patient says that the incident that brought her into the hospital took place when she had been living with some friends all of whom were drinking and drugging.  Patient says during that time she was drinking as much as a standard 750 mL bottle of liquor a day.  At one point she passed out on the floor and was unable to get back up and told her friends to just leave her.  She thinks she might have been on the floor for as much as 2 days before somebody finally had her brought to the hospital.  Patient has had a relatively lengthy hospitalization because of medical illness with alcohol withdrawal followed by infections followed by worsening hepatic failure and cirrhosis.  On interview today the patient was awake alert pleasant and insightful.  She tells me that this has  been a huge wake-up call for her.  She describes it as "hitting rock bottom and then having the trap door go out under you".  She is aware that she has a potentially fatal illness that makes further consumption of alcohol absolutely out of the question from a health standpoint.  She says she feels fairly confident about this but would like to go to substance abuse rehab.  Patient admitted she had been using crack cocaine occasionally as well prior to admission but says that is not her normal drug of choice.  Her mood currently is feeling relatively upbeat.  Does not feel particularly depressed.  She has been restarted on her medication including fluoxetineAnd olanzapine and olanzapine which is standing in for the Vraylar she used to take.  Patient reports that despite physical discomfort she is sleeping better.  Appetite has improved.  She Cyril Loosen is having very hallucinations or psychotic symptoms.  Denies any suicidal or homicidal ideation.  She presents as very appropriate and forthcoming.  Past Psychiatric History: Patient has a long history of bipolar 2 with frequent presentation with depression also with hallucinations but often in the context of substance abuse.  Has a more distant history of suicide attempts  Risk to Self:   Risk to Others:   Prior Inpatient Therapy:   Prior Outpatient Therapy:    Past Medical History:  Past Medical History:  Diagnosis Date   Alcohol abuse    Anxiety    Anxiety    Anxiety and depression  Asthma    Bipolar disorder ([name])    Borderline personality disorder ([name])    Depression    Diabetes ([name])    Type 2    Dyslipidemia    Esophageal varices ([name])    Foot drop    right   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[name])    GERD (gastroesophageal reflux disease)    Gout    History of drug abuse ([name])    Insomnia    Muscular dystrophies ([name])    Neuropathy    Pancreatitis    Recovering alcoholic ([name])     Past Surgical History:  Procedure  Laterality Date   HERNIA REPAIR     IR FLUORO GUIDE CV LINE RIGHT  12/30/2020   IR US GUIDE VASC ACCESS RIGHT  12/30/2020   Family History:  Family History  Problem Relation Age of Onset   Heart disease Mother    Heart attack Mother    Hyperlipidemia Maternal Grandmother    Diabetes Maternal Grandmother    Hypertension Maternal Grandmother    Hyperlipidemia Maternal Grandfather    Heart disease Maternal Grandfather    Heart attack Maternal Grandfather    Diabetes Maternal Grandfather    Lung cancer Maternal Grandfather    Hypertension Maternal Grandfather    Other Paternal Grandmother        Thyroid problems    Heart disease Paternal Grandfather    Stroke Paternal Grandfather    Hyperlipidemia Paternal Grandfather    Heart attack Paternal Grandfather    Hypertension Paternal Grandfather    Family Psychiatric  History: Family history of alcohol abuse in her father. Social History:  Social History   Substance and Sexual Activity  Alcohol Use Yes     Social History   Substance and Sexual Activity  Drug Use Yes   Types: IV, Cocaine    Social History   Socioeconomic History   Marital status: Legally Separated    Spouse name: Not on file   Number of children: Not on file   Years of education: Not on file   Highest education level: Not on file  Occupational History   Occupation: disabled  Tobacco Use   Smoking status: Every Day    Packs/day: 0.50    Types: Cigarettes   Smokeless tobacco: Never  Substance and Sexual Activity   Alcohol use: Yes   Drug use: Yes    Types: IV, Cocaine   Sexual activity: Yes    Birth control/protection: None  Other Topics Concern   Not on file  Social History Narrative   Not on file   Social Determinants of Health   Financial Resource Strain: Not on file  Food Insecurity: Not on file  Transportation Needs: Not on file  Physical Activity: Not on file  Stress: Not on file  Social Connections: Not on file   Additional Social  History:    Allergies:   Allergies  Allergen Reactions   Erythromycin Anaphylaxis   Sulfa Antibiotics Anaphylaxis   Tylenol [Acetaminophen] Other (See Comments)    Pancreatitis and liver dysfunction, not supposed to take med   Nsaids Other (See Comments)    Causes GI problems, very bad reaction-per patient.    Gabapentin     Mood changes     Labs:  Results for orders placed or performed during the hospital encounter of 12/17/20 (from the past 48 hour(s))  Glucose, capillary     Status: Abnormal   Collection Time: 01/08/21  4:39 PM  Result Value Ref Range  Glucose-Capillary 225 (H) 70 - 99 mg/dL    Comment: Glucose reference range applies only to samples taken after fasting for at least 8 hours.   Comment 1 Notify RN   Glucose, capillary     Status: Abnormal   Collection Time: 01/08/21  9:38 PM  Result Value Ref Range   Glucose-Capillary 239 (H) 70 - 99 mg/dL    Comment: Glucose reference range applies only to samples taken after fasting for at least 8 hours.  CBC with Differential/Platelet     Status: Abnormal   Collection Time: 01/09/21  5:11 AM  Result Value Ref Range   WBC 34.0 (H) 4.0 - 10.5 K/uL   RBC 2.26 (L) 3.87 - 5.11 MIL/uL   Hemoglobin 8.2 (L) 12.0 - 15.0 g/dL   HCT 88.4 (L) 16.6 - 06.3 %   MCV 115.0 (H) 80.0 - 100.0 fL   MCH 36.3 (H) 26.0 - 34.0 pg   MCHC 31.5 30.0 - 36.0 g/dL   RDW 01.6 (H) 01.0 - 93.2 %   Platelets 282 150 - 400 K/uL   nRBC 0.1 0.0 - 0.2 %   Neutrophils Relative % 85 %   Neutro Abs 28.6 (H) 1.7 - 7.7 K/uL   Lymphocytes Relative 9 %   Lymphs Abs 3.1 0.7 - 4.0 K/uL   Monocytes Relative 6 %   Monocytes Absolute 1.3 (H) 0.1 - 1.0 K/uL   Eosinophils Relative 0 %   Eosinophils Absolute 0.3 0.0 - 0.5 K/uL   Basophils Relative 0 %   Basophils Absolute 0.1 0.0 - 0.1 K/uL   nRBC 0 0 /100 WBC    Comment: Performed at [facility], [address] [place], [place] [address]  Comprehensive metabolic panel     Status: Abnormal    Collection Time: 01/09/21  5:11 AM  Result Value Ref Range   Sodium 135 135 - 145 mmol/L   Potassium 3.6 3.5 - 5.1 mmol/L   Chloride 87 (L) 98 - 111 mmol/L   CO2 34 (H) 22 - 32 mmol/L   Glucose, Bld 161 (H) 70 - 99 mg/dL    Comment: Glucose reference range applies only to samples taken after fasting for at least 8 hours.   BUN 20 6 - 20 mg/dL   Creatinine, Ser 2.20 0.44 - 1.00 mg/dL   Calcium 8.0 (L) 8.9 - 10.3 mg/dL   Total Protein 6.5 6.5 - 8.1 g/dL   Albumin <2.5 (L) 3.5 - 5.0 g/dL   AST 427 (H) 15 - 41 U/L   ALT 29 0 - 44 U/L   Alkaline Phosphatase 219 (H) 38 - 126 U/L   Total Bilirubin 9.6 (H) 0.3 - 1.2 mg/dL   GFR, Estimated >06 >23 mL/min    Comment: (NOTE) Calculated using the CKD-EPI Creatinine Equation (2021)    Anion gap 14 5 - 15    Comment: Performed at [facility], [address], [place], [place] [address]  Magnesium     Status: None   Collection Time: 01/09/21  5:11 AM  Result Value Ref Range   Magnesium 1.8 1.7 - 2.4 mg/dL    Comment: Performed at [facility], [address] [place], [place] [address]  Urinalysis, Complete w Microscopic     Status: Abnormal   Collection Time: 01/09/21  5:30 AM  Result Value Ref Range   Color, Urine AMBER (A) YELLOW    Comment: BIOCHEMICALS MAY BE AFFECTED BY COLOR   APPearance CLOUDY (A) CLEAR  Specific Gravity, Urine 1.012 1.005 - 1.030   pH 6.0 5.0 - 8.0   Glucose, UA NEGATIVE NEGATIVE mg/dL   Hgb urine dipstick SMALL (A) NEGATIVE   Bilirubin Urine MODERATE (A) NEGATIVE   Ketones, ur NEGATIVE NEGATIVE mg/dL   Protein, ur 30 (A) NEGATIVE mg/dL   Nitrite NEGATIVE NEGATIVE   Leukocytes,Ua LARGE (A) NEGATIVE   RBC / HPF 0-5 0 - 5 RBC/hpf   WBC, UA >50 (H) 0 - 5 WBC/hpf   Bacteria, UA RARE (A) NONE SEEN   Squamous Epithelial / LPF 6-10 0 - 5   WBC Clumps PRESENT     Comment: Performed at [facility], [address] [place], [place] [address]  Glucose, capillary     Status: Abnormal    Collection Time: 01/09/21  8:25 AM  Result Value Ref Range   Glucose-Capillary 187 (H) 70 - 99 mg/dL    Comment: Glucose reference range applies only to samples taken after fasting for at least 8 hours.  Glucose, capillary     Status: Abnormal   Collection Time: 01/09/21  1:08 PM  Result Value Ref Range   Glucose-Capillary 219 (H) 70 - 99 mg/dL    Comment: Glucose reference range applies only to samples taken after fasting for at least 8 hours.  Glucose, capillary     Status: Abnormal   Collection Time: 01/09/21  4:05 PM  Result Value Ref Range   Glucose-Capillary 231 (H) 70 - 99 mg/dL    Comment: Glucose reference range applies only to samples taken after fasting for at least 8 hours.  Glucose, capillary     Status: Abnormal   Collection Time: 01/09/21  9:14 PM  Result Value Ref Range   Glucose-Capillary 204 (H) 70 - 99 mg/dL    Comment: Glucose reference range applies only to samples taken after fasting for at least 8 hours.   Comment 1 Notify RN   CBC with Differential/Platelet     Status: Abnormal   Collection Time: 01/10/21  5:18 AM  Result Value Ref Range   WBC 29.7 (H) 4.0 - 10.5 K/uL   RBC 2.33 (L) 3.87 - 5.11 MIL/uL   Hemoglobin 8.4 (L) 12.0 - 15.0 g/dL   HCT 60.4 (L) 54.0 - 98.1 %   MCV 115.5 (H) 80.0 - 100.0 fL   MCH 36.1 (H) 26.0 - 34.0 pg   MCHC 31.2 30.0 - 36.0 g/dL   RDW 19.1 (H) 47.8 - 29.5 %   Platelets 305 150 - 400 K/uL   nRBC 0.1 0.0 - 0.2 %   Neutrophils Relative % 83 %   Neutro Abs 24.7 (H) 1.7 - 7.7 K/uL   Lymphocytes Relative 8 %   Lymphs Abs 2.4 0.7 - 4.0 K/uL   Monocytes Relative 4 %   Monocytes Absolute 1.2 (H) 0.1 - 1.0 K/uL   Eosinophils Relative 1 %   Eosinophils Absolute 0.2 0.0 - 0.5 K/uL   Basophils Relative 0 %   Basophils Absolute 0.1 0.0 - 0.1 K/uL   Smear Review Normal platelet morphology    Immature Granulocytes 4 %   Abs Immature Granulocytes 1.14 (H) 0.00 - 0.07 K/uL   Polychromasia PRESENT    Ovalocytes PRESENT     Comment:  Performed at [facility], [address] [place], [place] [address]  Comprehensive metabolic panel     Status: Abnormal   Collection Time: 01/10/21  5:18 AM  Result Value Ref Range   Sodium 133 (L) 135 -  145 mmol/L   Potassium 3.8 3.5 - 5.1 mmol/L   Chloride 91 (L) 98 - 111 mmol/L   CO2 35 (H) 22 - 32 mmol/L   Glucose, Bld 264 (H) 70 - 99 mg/dL    Comment: Glucose reference range applies only to samples taken after fasting for at least 8 hours.   BUN 20 6 - 20 mg/dL   Creatinine, Ser 1.02 0.44 - 1.00 mg/dL   Calcium 8.4 (L) 8.9 - 10.3 mg/dL   Total Protein 6.4 (L) 6.5 - 8.1 g/dL   Albumin <7.2 (L) 3.5 - 5.0 g/dL   AST 536 (H) 15 - 41 U/L   ALT 29 0 - 44 U/L   Alkaline Phosphatase 239 (H) 38 - 126 U/L   Total Bilirubin 9.3 (H) 0.3 - 1.2 mg/dL   GFR, Estimated >64 >40 mL/min    Comment: (NOTE) Calculated using the CKD-EPI Creatinine Equation (2021)    Anion gap 7 5 - 15    Comment: Performed at [facility], [address] [place], [place] [address]  Magnesium     Status: Abnormal   Collection Time: 01/10/21  5:18 AM  Result Value Ref Range   Magnesium 1.6 (L) 1.7 - 2.4 mg/dL    Comment: Performed at [facility], [address] [place], [place] [address]  Glucose, capillary     Status: Abnormal   Collection Time: 01/10/21  7:59 AM  Result Value Ref Range   Glucose-Capillary 254 (H) 70 - 99 mg/dL    Comment: Glucose reference range applies only to samples taken after fasting for at least 8 hours.  Ammonia     Status: Abnormal   Collection Time: 01/10/21 10:00 AM  Result Value Ref Range   Ammonia 43 (H) 9 - 35 umol/L    Comment: Performed at [facility], [address] [place], [place] [address]  Glucose, capillary     Status: Abnormal   Collection Time: 01/10/21 11:50 AM  Result Value Ref Range   Glucose-Capillary 231 (H) 70 - 99 mg/dL    Comment: Glucose reference range applies only to samples taken after fasting for at least 8  hours.    Current Facility-Administered Medications  Medication Dose Route Frequency Provider Last Rate Last Admin   0.9 %  sodium chloride infusion   Intravenous PRN Andris Baumann, MD   Stopped at 12/31/20 0118   albuterol (PROVENTIL) (2.5 MG/3ML) 0.083% nebulizer solution 2.5 mg  2.5 mg Nebulization Q6H PRN Andris Baumann, MD   2.5 mg at 01/07/21 0855   budesonide (ENTOCORT EC) 24 hr capsule 9 mg  9 mg Oral Daily Swayze, Ava, DO   9 mg at 01/10/21 0809   ceFEPIme (MAXIPIME) 2 g in sodium chloride 0.9 % 100 mL IVPB  2 g Intravenous Q12H Mick Sell, MD 200 mL/hr at 01/10/21 0815 2 g at 01/10/21 0815   dicyclomine (BENTYL) tablet 20 mg  20 mg Oral TID PRN Andris Baumann, MD   20 mg at 01/09/21 0618   feeding supplement (ENSURE ENLIVE / ENSURE PLUS) liquid 237 mL  237 mL Oral TID BM Pokhrel, Laxman, MD   237 mL at 01/10/21 1429   FLUoxetine (PROZAC) capsule 80 mg  80 mg Oral Daily Swayze, Ava, DO   80 mg at 01/10/21 0809   fluticasone (FLONASE) 50 MCG/ACT nasal spray 1 spray  1 spray Each Nare Daily Swayze, Ava, DO   1 spray at 01/10/21 [phone]  folic acid (FOLVITE) tablet 1 mg  1 mg Oral Daily Delfino Lovett, MD   1 mg at 01/10/21 0809   furosemide (LASIX) tablet 40 mg  40 mg Oral Daily Marrion Coy, MD   40 mg at 01/10/21 0809   insulin aspart (novoLOG) injection 0-20 Units  0-20 Units Subcutaneous TID WC Andris Baumann, MD   7 Units at 01/10/21 1215   insulin aspart (novoLOG) injection 0-5 Units  0-5 Units Subcutaneous QHS Andris Baumann, MD   2 Units at 01/09/21 2307   insulin aspart (novoLOG) injection 6 Units  6 Units Subcutaneous TID WC Marrion Coy, MD   6 Units at 01/10/21 1215   insulin glargine-yfgn (SEMGLEE) injection 26 Units  26 Units Subcutaneous QHS Marrion Coy, MD   26 Units at 01/09/21 2308   lactulose (CHRONULAC) 10 GM/15ML solution 20 g  20 g Oral BID PRN Toney Reil, MD       magnesium sulfate IVPB 4 g 100 mL  4 g Intravenous Once Marrion Coy, MD 50 mL/hr at  01/10/21 1428 4 g at 01/10/21 1428   methocarbamol (ROBAXIN) tablet 500 mg  500 mg Oral BID PRN Delfino Lovett, MD   500 mg at 01/09/21 1830   multivitamin with minerals tablet 1 tablet  1 tablet Oral Daily Delfino Lovett, MD   1 tablet at 01/10/21 0809   OLANZapine (ZYPREXA) tablet 5 mg  5 mg Oral QHS Dianna Deshler, Jackquline Denmark, MD       ondansetron [facility]) tablet 4 mg  4 mg Oral Q6H PRN Andris Baumann, MD       Or   ondansetron [facility]) injection 4 mg  4 mg Intravenous Q6H PRN Andris Baumann, MD   4 mg at 01/07/21 1536   pantoprazole (PROTONIX) EC tablet 40 mg  40 mg Oral BID Tressie Ellis, RPH   40 mg at 01/10/21 0809   pregabalin (LYRICA) capsule 75 mg  75 mg Oral TID Swayze, Ava, DO   75 mg at 01/10/21 0809   rifaximin (XIFAXAN) tablet 550 mg  550 mg Oral BID Marrion Coy, MD   550 mg at 01/10/21 1427   spironolactone (ALDACTONE) tablet 100 mg  100 mg Oral Daily Toney Reil, MD   100 mg at 01/10/21 0809   thiamine tablet 100 mg  100 mg Oral Daily Tressie Ellis, RPH   100 mg at 01/10/21 0809   Vitamin D (Ergocalciferol) (DRISDOL) capsule 50,000 Units  50,000 Units Oral Q7 days Gillis Santa, MD   50,000 Units at 01/05/21 1021    Musculoskeletal: Strength & Muscle Tone: decreased Gait & Station: normal Patient leans: N/A            Psychiatric Specialty Exam:  Presentation  General Appearance: No data recorded Eye Contact:No data recorded Speech:No data recorded Speech Volume:No data recorded Handedness:No data recorded  Mood and Affect  Mood:No data recorded Affect:No data recorded  Thought Process  Thought Processes:No data recorded Descriptions of Associations:No data recorded Orientation:No data recorded Thought Content:No data recorded History of Schizophrenia/Schizoaffective disorder:No data recorded Duration of Psychotic Symptoms:No data recorded Hallucinations:No data recorded Ideas of Reference:No data recorded Suicidal Thoughts:No data  recorded Homicidal Thoughts:No data recorded  Sensorium  Memory:No data recorded Judgment:No data recorded Insight:No data recorded  Executive Functions  Concentration:No data recorded Attention Span:No data recorded Recall:No data recorded Fund of Knowledge:No data recorded Language:No data recorded  Psychomotor Activity  Psychomotor Activity:No data recorded  Assets  Assets:No  data recorded  Sleep  Sleep:No data recorded  Physical Exam: Physical Exam Vitals and nursing note reviewed.  Constitutional:      Appearance: Normal appearance.  HENT:     Head: Normocephalic and atraumatic.     Mouth/Throat:     Pharynx: Oropharynx is clear.  Eyes:     Pupils: Pupils are equal, round, and reactive to light.  Cardiovascular:     Rate and Rhythm: Normal rate and regular rhythm.  Pulmonary:     Effort: Pulmonary effort is normal.     Breath sounds: Normal breath sounds.  Abdominal:     General: Abdomen is flat.     Palpations: Abdomen is soft.  Musculoskeletal:        General: Normal range of motion.  Skin:    General: Skin is warm and dry.  Neurological:     General: No focal deficit present.     Mental Status: She is alert. Mental status is at baseline.  Psychiatric:        Attention and Perception: Attention normal.        Mood and Affect: Mood normal.        Speech: Speech normal.        Behavior: Behavior is cooperative.        Thought Content: Thought content normal.        Cognition and Memory: Cognition normal.        Judgment: Judgment normal.   Review of Systems  Constitutional: Negative.   HENT: Negative.    Eyes: Negative.   Respiratory: Negative.    Cardiovascular: Negative.   Gastrointestinal: Negative.   Musculoskeletal: Negative.   Skin: Negative.   Neurological: Negative.   Psychiatric/Behavioral:  Positive for substance abuse. Negative for depression, hallucinations, memory loss and suicidal ideas. The patient is not nervous/anxious and  does not have insomnia.   Blood pressure 121/74, pulse (!) 104, temperature 98.6 F (37 C), temperature source Oral, resp. rate 18, height 5\' 7"  (1.702 m), weight 108.5 kg, SpO2 93 %. Body mass index is 37.46 kg/m.  Treatment Plan Summary: Medication management and Plan reviewed medication.  Patient is back on fluoxetine 80 mg a day and tolerating it well.  It appears that even low-dose Vraylar would probably not be appropriate with her degree of hepatic failure at this time.  She is doing well mentally and I think we can continue the olanzapine.  Patient is asking to restart the Topamax and I will have to look into whether or not that is appropriate with her liver function.  Patient is aware of that.  She said that despite the fact that she is not religious in the most traditional sense she thought that some spiritual guidance would help and I offered to put in a chaplain consult for her.  Patient says she is seeking to go to substance abuse rehab at discharge.  If she is physically capable of it there should be options available with her Medicare and I will stay in touch with [facility] if we can be of help with that.  She is long past any problem with alcohol withdrawal and does not need that addressed any further.  Supportive counseling and therapy and education is completed.  Disposition: No evidence of imminent risk to self or others at present.   Patient does not meet criteria for psychiatric inpatient admission. Supportive therapy provided about ongoing stressors. Discussed crisis plan, support from social network, calling 911, coming to the Emergency Department, and calling  Suicide Hotline.  Mordecai Rasmussen, MD 01/10/2021 2:37 PM

## 2021-01-10 NOTE — Progress Notes (Addendum)
Order Requisition to provide support to patient around recovery. Patient was receptive to me but had family members bedside. She aske if I could  hold off until tomorrow. Will have unit chaplain to follow up.

## 2021-01-10 NOTE — Progress Notes (Signed)
PROGRESS NOTE    [name]  GBE:[id number] DOB: March 04, 1976 DOA: 12/17/2020 PCP: Eunice Blase, PA-C    Brief Narrative:  [name] is a 45 y.o. female with medical history significant for Bipolar disorder, diabetes mellitus alcohol use disorder and polysubstance abuse, FSH muscular dystrophy, with history of falls, last seen by neurology in January 2020, who was brought in by EMS was brought into the hospital after sustaining a fall and was unable to get up.  In the ED, patient was noted to have severe hyponatremia with sodium level of 113, potassium of 2.9.  Ammonia was elevated at 52 with bilirubin of 11.9.  Patient did have a right lower lobe infiltrate and received antibiotic with Unasyn and IV fluids.  She was also noted to have hepatic encephalopathy and was admitted to hospital.  Patient initially completed the whole course of Unasyn and Zyvox. 10/25.  Patient has completed antibiotics.  However, patient had a persistent leukocytosis, was found to have UTI.  Cefepime restarted.  Assessment & Plan:   Principal Problem:   Sepsis ([name]) Active Problems:   Alcohol withdrawal ([name])   Bipolar 2 disorder ([name])   Hypokalemia   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[name])   Acute hyponatremia   Alcohol use disorder, moderate, dependence ([name])   Hepatic steatosis   Polysubstance abuse ([name])   Hyperglycemia due to type 2 diabetes mellitus ([name])   Hyperbilirubinemia   Lactic acidosis   Alcoholic liver disease ([name])   Acute hepatic encephalopathy   Pressure injury of skin   Aspiration pneumonia of both lower lobes due to gastric secretions ([name])  Sepsis. Acute hypoxemic respiratory failure secondary to aspiration pneumonia Aspiration pneumonia. Persistent leukocytosis due to urinary tract infection. Patient condition had initially improved, but developed persistent leukocytosis.  Urine study was abnormal, consulted ID, started cefepime. Patient has right IJ tunneled  catheter, does not appear to be infected.  Culture from it was negative.   Alcohol abuse and polydrug abuse. 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 Liver failure secondary to alcoholic liver cirrhosis. Acute hepatic encephalopathy. Severe hypoalbuminemia secondary to liver cirrhosis. Condition has stabilized.  Patient appears to have accumulated more ascites.  May consider paracentesis before transfer to nursing home. Patient mental status has improved, continue rifaximin.  Hyponatremia. Hyperkalemia followed by hypokalemia Hypomagnesemia. Hypophosphatemia. Replete magnesium    DVT prophylaxis: lovenox Code Status: full Family Communication:  Disposition Plan:      Status is: Inpatient   Remains inpatient appropriate because: Severity of disease, and unsafe discharge       I/O last 3 completed shifts: In: 169.9 [I.V.:70; IV Piggyback:99.9] Out: 2300 [Urine:2300] Total I/O In: 180 [P.O.:180] Out: -    Consultants:  GI, ID   Procedures: None   Antimicrobials: Cefepime.     Subjective: Patient is doing well today, has some abdominal distention, no abdominal pain or nausea vomiting.  She has normal bowel movements. No Short of breath or cough. No fever and chills. No dysuria hematuria.  Objective: Vitals:   01/09/21 2109 01/10/21 0315 01/10/21 0500 01/10/21 0817  BP: 114/72 121/80  121/74  Pulse: (!) 103 (!) 104  (!) 104  Resp: 16 16  18   Temp: 98.8 F (37.1 C) 98.4 F (36.9 C)  98.6 F (37 C)  TempSrc: Oral Oral  Oral  SpO2: 94% 96%  93%  Weight:   108.5 kg   Height:        Intake/Output Summary (Last 24 hours) at 01/10/2021  1304 Last data filed at 01/10/2021 1000 Gross per 24 hour  Intake 349.86 ml  Output 1200 ml  Net -850.14 ml   Filed Weights   01/08/21 0357 01/09/21 0522 01/10/21 0500  Weight: 108.3 kg 107.6 kg 108.5 kg    Examination:  General exam: Appears calm and comfortable   Respiratory system: Clear to auscultation. Respiratory effort normal. Cardiovascular system: S1 & S2 heard, RRR. No JVD, murmurs, rubs, gallops or clicks. 1+ pedal edema. Gastrointestinal system: Abdomen is distended, soft and nontender. No organomegaly or masses felt. Normal bowel sounds heard. Central nervous system: Alert and oriented. No focal neurological deficits. Extremities: Symmetric 5 x 5 power. Skin: Jaundice Psychiatry: Judgement and insight appear normal. Mood & affect appropriate.     Data Reviewed: I have personally reviewed following labs and imaging studies  CBC: Recent Labs  Lab 01/06/21 0631 01/07/21 0449 01/08/21 0442 01/09/21 0511 01/10/21 0518  WBC 26.8* 28.4* 32.8* 34.0* 29.7*  NEUTROABS 22.6* 24.5* 28.1* 28.6* 24.7*  HGB 7.9* 8.2* 8.0* 8.2* 8.4*  HCT 25.6* 26.3* 25.9* 26.0* 26.9*  MCV 112.8* 114.8* 113.1* 115.0* 115.5*  PLT 193 186 250 282 305   Basic Metabolic Panel: Recent Labs  Lab 01/04/21 0311 01/05/21 0556 01/06/21 0631 01/07/21 0449 01/08/21 0442 01/09/21 0511 01/10/21 0518  NA 135 136 136 135 133* 135 133*  K 3.3* 3.1* 3.2* 3.3* 4.0 3.6 3.8  CL 87* 89* 91* 90* 88* 87* 91*  CO2 37* 37* 36* 34* 35* 34* 35*  GLUCOSE 179* 179* 247* 254* 225* 161* 264*  BUN 17 19 19 18 17 20 20   CREATININE 0.62 0.48 0.55 0.54 0.53 0.55 0.55  CALCIUM 7.5* 7.3* 7.3* 7.4* 7.8* 8.0* 8.4*  MG 1.6* 1.7 1.5* 1.8 1.6* 1.8 1.6*  PHOS 2.9 2.7  --   --  3.0  --   --    GFR: Estimated Creatinine Clearance: 113.9 mL/min (by C-G formula based on SCr of 0.55 mg/dL). Liver Function Tests: Recent Labs  Lab 01/05/21 0556 01/06/21 0631 01/08/21 0442 01/09/21 0511 01/10/21 0518  AST 161* 158* 154* 143* 139*  ALT 31 30 28 29 29   ALKPHOS 190* 198* 201* 219* 239*  BILITOT 9.9* 9.8* 9.8* 9.6* 9.3*  PROT 6.1* 6.3* 6.5 6.5 6.4*  ALBUMIN <1.5* <1.5* 1.5* <1.5* <1.5*   No results for input(s): LIPASE, AMYLASE in the last 168 hours. Recent Labs  Lab 01/10/21 1000   AMMONIA 43*   Coagulation Profile: No results for input(s): INR, PROTIME in the last 168 hours. Cardiac Enzymes: No results for input(s): CKTOTAL, CKMB, CKMBINDEX, TROPONINI in the last 168 hours. BNP (last 3 results) No results for input(s): PROBNP in the last 8760 hours. HbA1C: No results for input(s): HGBA1C in the last 72 hours. CBG: Recent Labs  Lab 01/09/21 1308 01/09/21 1605 01/09/21 2114 01/10/21 0759 01/10/21 1150  GLUCAP 219* 231* 204* 254* 231*   Lipid Profile: No results for input(s): CHOL, HDL, LDLCALC, TRIG, CHOLHDL, LDLDIRECT in the last 72 hours. Thyroid Function Tests: No results for input(s): TSH, T4TOTAL, FREET4, T3FREE, THYROIDAB in the last 72 hours. Anemia Panel: No results for input(s): VITAMINB12, FOLATE, FERRITIN, TIBC, IRON, RETICCTPCT in the last 72 hours. Sepsis Labs: Recent Labs  Lab 01/04/21 0311 01/05/21 0556 01/06/21 0631  PROCALCITON 1.77 2.73 2.54    Recent Results (from the past 240 hour(s))  Cath Tip Culture     Status: None   Collection Time: 01/04/21  1:59 PM   Specimen: Catheter Tip; Other  Result Value Ref Range Status   Specimen Description   Final    CATH TIP Performed at [facility], [address], [place], [place] [address]    Special Requests   Final    NONE Performed at [facility], [address], [place], [place] [address]    Culture   Final    NO GROWTH 2 DAYS Performed at [facility], [address], [place], [place] [address]    Report Status 01/07/2021 FINAL  Final  Culture, blood (single) w Reflex to ID Panel     Status: None   Collection Time: 01/04/21  2:00 PM   Specimen: BLOOD  Result Value Ref Range Status   Specimen Description BLOOD ALIN  Final   Special Requests   Final    BOTTLES DRAWN AEROBIC AND ANAEROBIC Blood Culture results may not be optimal due to an excessive volume of blood received in culture bottles   Culture   Final    NO GROWTH 5 DAYS Performed at  [facility], [address], [place], [place] [address]    Report Status 01/09/2021 FINAL  Final  Culture, blood (single) w Reflex to ID Panel     Status: None   Collection Time: 01/04/21  2:35 PM   Specimen: BLOOD  Result Value Ref Range Status   Specimen Description BLOOD LEFT ANTECUBITAL  Final   Special Requests   Final    BOTTLES DRAWN AEROBIC AND ANAEROBIC Blood Culture adequate volume   Culture   Final    NO GROWTH 5 DAYS Performed at [facility], [address] [place], [place] [address]    Report Status 01/09/2021 FINAL  Final  Body fluid culture w Gram Stain     Status: None   Collection Time: 01/06/21 10:55 AM   Specimen: PATH Cytology Peritoneal fluid  Result Value Ref Range Status   Specimen Description   Final    PERITONEAL Performed at [facility], [address] [place], [place] [address]    Special Requests   Final    NONE Performed at [facility], [address] [place], [place] [address]    Gram Stain   Final    FEW WBC PRESENT, PREDOMINANTLY MONONUCLEAR NO ORGANISMS SEEN    Culture   Final    NO GROWTH 3 DAYS Performed at [facility], [address] [place], [place] [address]    Report Status 01/09/2021 FINAL  Final         Radiology Studies: No results found.      Scheduled Meds:  budesonide  9 mg Oral Daily   cariprazine  1.5 mg Oral Daily   feeding supplement  237 mL Oral TID BM   FLUoxetine  80 mg Oral Daily   fluticasone  1 spray Each Nare Daily   folic acid  1 mg Oral Daily   furosemide  40 mg Oral Daily   insulin aspart  0-20 Units Subcutaneous TID WC   insulin aspart  0-5 Units Subcutaneous QHS   insulin aspart  6 Units Subcutaneous TID WC   insulin glargine-yfgn  26 Units Subcutaneous QHS   multivitamin with minerals  1 tablet Oral Daily   pantoprazole  40 mg Oral BID   pregabalin  75 mg Oral TID   rifaximin  550 mg Oral BID   spironolactone  100 mg Oral Daily    thiamine  100 mg Oral Daily   Vitamin D (  Ergocalciferol)  50,000 Units Oral Q7 days   Continuous Infusions:  sodium chloride Stopped (12/31/20 0118)   ceFEPime (MAXIPIME) IV 2 g (01/10/21 0815)     LOS: 23 days    Time spent: 28 minutes    Marrion Coy, MD [facility]   To contact the attending provider between 7A-7P or the covering provider during after hours 7P-7A, please log into the web site [email or web address] and access using universal [place] password for that web site. If you do not have the password, please call the hospital operator.  01/10/2021, 1:04 PM

## 2021-01-10 NOTE — Progress Notes (Signed)
Inpatient Diabetes Program Recommendations  AACE/ADA: New Consensus Statement on Inpatient Glycemic Control   Target Ranges:  Prepandial:   less than 140 mg/dL      Peak postprandial:   less than 180 mg/dL (1-2 hours)      Critically ill patients:  140 - 180 mg/dL   Results for GARI, TROVATO (MRN [id number]) as of 01/10/2021 11:21  Ref. Range 01/09/2021 08:25 01/09/2021 13:08 01/09/2021 16:05 01/09/2021 21:14 01/10/2021 07:59  Glucose-Capillary Latest Ref Range: 70 - 99 mg/dL 725 (H) 366 (H) 440 (H) 204 (H) 254 (H)   Review of Glycemic Control  Admit with: Fall/ Aspiration Pneumonia/ Sepsis/ Hyponatremia/ Alcohol liver disease with hepatic steatosis and ascites Severe hyperbilirubinemia/jaundice/ Elevated ammonia level   History: DM2, Bipolar disorder, Polysubstance Abuse Home DM Meds: Janumet XR 100/1000 mg Daily                             Metformin 500 mg BID                             Ozempic Qweek Current orders for Inpatient glycemic control: Semglee 26 units QHS, Novolog 6 units TID with meals, Novolog 0-20 units TID with meals, Novolog 0-5 units QHS; Entocort 9 mg daily  Inpatient Diabetes Program Recommendations:    Insulin: Please consider increasing Semglee to 28 units QHS and meal coverage to Novolog 9 units TID with meals.  Thanks, Orlando Penner, RN, MSN, CDE Diabetes Coordinator Inpatient Diabetes Program [phone] (Team Pager from 8am to 5pm)

## 2021-01-10 NOTE — TOC Progression Note (Signed)
Transition of Care [facility]) - Progression Note    Patient Details  Name: [name] MRN: [id number] Date of Birth: 1975-09-11  Transition of [facility]) CM/SW Contact  Chapman Fitch, RN Phone Number: 01/10/2021, 9:09 AM  Clinical Narrative:     No bed offers [facility] can not offer Follow up with MD about psych consult from last week   Expected Discharge Plan: Skilled Nursing Facility Barriers to Discharge: Continued Medical Work up  Expected Discharge Plan and Services Expected Discharge Plan: Skilled Nursing Facility     Post Acute Care Choice: Skilled Nursing Facility Living arrangements for the past 2 months: Single Family Home                                       Social Determinants of Health (SDOH) Interventions    Readmission Risk Interventions No flowsheet data found.

## 2021-01-10 NOTE — Progress Notes (Signed)
Date of Admission:  12/17/2020    ID: [name] is a 45 y.o. female Principal Problem:   Sepsis ([name]) Active Problems:   Alcohol withdrawal ([name])   Bipolar 2 disorder ([name])   Hypokalemia   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[name])   Acute hyponatremia   Alcohol use disorder, moderate, dependence ([name])   Hepatic steatosis   Polysubstance abuse ([name])   Hyperglycemia due to type 2 diabetes mellitus ([name])   Hyperbilirubinemia   Lactic acidosis   Alcoholic liver disease ([name])   Acute hepatic encephalopathy   Pressure injury of skin   Aspiration pneumonia of both lower lobes due to gastric secretions ([name])    Subjective: Pt says she has no diarrhea Has abdominal bloating Says the fluid is accumulating again Tired No dysuria No difficulty in passing urine   Medications:   budesonide  9 mg Oral Daily   cariprazine  1.5 mg Oral Daily   feeding supplement  237 mL Oral TID BM   FLUoxetine  80 mg Oral Daily   fluticasone  1 spray Each Nare Daily   folic acid  1 mg Oral Daily   furosemide  40 mg Oral Daily   insulin aspart  0-20 Units Subcutaneous TID WC   insulin aspart  0-5 Units Subcutaneous QHS   insulin aspart  6 Units Subcutaneous TID WC   insulin glargine-yfgn  26 Units Subcutaneous QHS   multivitamin with minerals  1 tablet Oral Daily   pantoprazole  40 mg Oral BID   pregabalin  75 mg Oral TID   spironolactone  100 mg Oral Daily   thiamine  100 mg Oral Daily   Vitamin D (Ergocalciferol)  50,000 Units Oral Q7 days    Objective: Vital signs in last 24 hours: Temp:  [98.4 F (36.9 C)-99.3 F (37.4 C)] 98.6 F (37 C) (10/25 0817) Pulse Rate:  [103-110] 104 (10/25 0817) Resp:  [16-18] 18 (10/25 0817) BP: (112-121)/(72-80) 121/74 (10/25 0817) SpO2:  [93 %-96 %] 93 % (10/25 0817) Weight:  [108.5 kg] 108.5 kg (10/25 0500)  PHYSICAL EXAM:  General: Alert, chronically ill, jaundiced  Head: Normocephalic, without obvious abnormality,  atraumatic. Eyes: icteric sclerae. Pupils are equal ENT Nares normal. No drainage or sinus tenderness. Tongue fissured and dry Neck: Supple, symmetrical, no adenopathy, thyroid: non tender no carotid bruit and no JVD. Back: No CVA tenderness. Lungs: b/l air entry- decreased bases  Heart: irregular. Abdomen: Soft, distended- not tender Extremities: atraumatic, no cyanosis. No edema. No clubbing Skin: multiple  bruising Lymph: Cervical, supraclavicular normal. Neurologic: Grossly non-focal Tunneled catheter Lab Results  Recent Labs    01/09/21 0511 01/10/21 0518  WBC 34.0* 29.7*  HGB 8.2* 8.4*  HCT 26.0* 26.9*  NA 135 133*  K 3.6 3.8  CL 87* 91*  CO2 34* 35*  BUN 20 20  CREATININE 0.55 0.55   Liver Panel Recent Labs    01/09/21 0511 01/10/21 0518  PROT 6.5 6.4*  ALBUMIN <1.5* <1.5*  AST 143* 139*  ALT 29 29  ALKPHOS 219* 239*  BILITOT 9.6* 9.3*     Microbiology:  Studies/Results: No results found.   Assessment/Plan: Leucocytosis Pt being treated as UTI- clinically asymptomatic- will Dc cefepime likely tomorrow Clincally currently cdiff not a concern Lactulose induced diarrhea- resolved since stopping lactulose Alcoholic hepatitis Marked hepatomegaly with steato hepatitis Hepatic encephalopathy-resolved Ascites- secondary to above No evidence of sbp  Recommend Xray abdomen to look for dilated loops    Polysubstance use  Discussed the management with patient and care team

## 2021-01-11 ENCOUNTER — Encounter: Payer: Self-pay | Admitting: Internal Medicine

## 2021-01-11 DIAGNOSIS — K709 Alcoholic liver disease, unspecified: Secondary | ICD-10-CM | POA: Diagnosis not present

## 2021-01-11 DIAGNOSIS — K7011 Alcoholic hepatitis with ascites: Secondary | ICD-10-CM | POA: Diagnosis not present

## 2021-01-11 DIAGNOSIS — F1020 Alcohol dependence, uncomplicated: Secondary | ICD-10-CM | POA: Diagnosis not present

## 2021-01-11 LAB — COMPREHENSIVE METABOLIC PANEL
ALT: 28 U/L (ref 0–44)
AST: 128 U/L — ABNORMAL HIGH (ref 15–41)
Albumin: <1.5 g/dL — ABNORMAL LOW (ref 3.5–5.0)
Alkaline Phosphatase: 232 U/L — ABNORMAL HIGH (ref 38–126)
Anion gap: 6 (ref 5–15)
BUN: 18 mg/dL (ref 6–20)
CO2: 35 mmol/L — ABNORMAL HIGH (ref 22–32)
Calcium: 8.1 mg/dL — ABNORMAL LOW (ref 8.9–10.3)
Chloride: 92 mmol/L — ABNORMAL LOW (ref 98–111)
Creatinine, Ser: 0.69 mg/dL (ref 0.44–1.00)
GFR, Estimated: >60 mL/min (ref >60)
Glucose, Bld: 247 mg/dL — ABNORMAL HIGH (ref 70–99)
Potassium: 3.9 mmol/L (ref 3.5–5.1)
Sodium: 133 mmol/L — ABNORMAL LOW (ref 135–145)
Total Bilirubin: 8.3 mg/dL — ABNORMAL HIGH (ref 0.3–1.2)
Total Protein: 6.5 g/dL (ref 6.5–8.1)

## 2021-01-11 LAB — CBC WITH DIFFERENTIAL/PLATELET
Abs Immature Granulocytes: 0.8 10*3/uL — ABNORMAL HIGH (ref 0.00–0.07)
Basophils Absolute: 0.1 10*3/uL (ref 0.0–0.1)
Basophils Relative: 0 %
Eosinophils Absolute: 0.4 10*3/uL (ref 0.0–0.5)
Eosinophils Relative: 1 %
HCT: 26.7 % — ABNORMAL LOW (ref 36.0–46.0)
Hemoglobin: 8.2 g/dL — ABNORMAL LOW (ref 12.0–15.0)
Immature Granulocytes: 3 %
Lymphocytes Relative: 9 %
Lymphs Abs: 2.5 10*3/uL (ref 0.7–4.0)
MCH: 35 pg — ABNORMAL HIGH (ref 26.0–34.0)
MCHC: 30.7 g/dL (ref 30.0–36.0)
MCV: 114.1 fL — ABNORMAL HIGH (ref 80.0–100.0)
Monocytes Absolute: 1.4 10*3/uL — ABNORMAL HIGH (ref 0.1–1.0)
Monocytes Relative: 5 %
Neutro Abs: 23.2 10*3/uL — ABNORMAL HIGH (ref 1.7–7.7)
Neutrophils Relative %: 82 %
Platelets: 289 10*3/uL (ref 150–400)
RBC: 2.34 MIL/uL — ABNORMAL LOW (ref 3.87–5.11)
RDW: 19 % — ABNORMAL HIGH (ref 11.5–15.5)
Smear Review: NORMAL
WBC: 28.4 10*3/uL — ABNORMAL HIGH (ref 4.0–10.5)
nRBC: 0.1 % (ref 0.0–0.2)

## 2021-01-11 LAB — GLUCOSE, CAPILLARY
Glucose-Capillary: 212 mg/dL — ABNORMAL HIGH (ref 70–99)
Glucose-Capillary: 256 mg/dL — ABNORMAL HIGH (ref 70–99)
Glucose-Capillary: 353 mg/dL — ABNORMAL HIGH (ref 70–99)
Glucose-Capillary: 358 mg/dL — ABNORMAL HIGH (ref 70–99)

## 2021-01-11 LAB — PHOSPHORUS: Phosphorus: 3.5 mg/dL (ref 2.5–4.6)

## 2021-01-11 LAB — MAGNESIUM: Magnesium: 2 mg/dL (ref 1.7–2.4)

## 2021-01-11 NOTE — Plan of Care (Signed)
  Problem: Education: Goal: Knowledge of General Education information will improve Description: Including pain rating scale, medication(s)/side effects and non-pharmacologic comfort measures Outcome: Progressing   Problem: Health Behavior/Discharge Planning: Goal: Ability to manage health-related needs will improve Outcome: Progressing   Problem: Clinical Measurements: Goal: Ability to maintain clinical measurements within normal limits will improve Outcome: Progressing Goal: Will remain free from infection Outcome: Progressing Goal: Diagnostic test results will improve Outcome: Progressing Goal: Respiratory complications will improve Outcome: Progressing Goal: Cardiovascular complication will be avoided Outcome: Progressing   Problem: Activity: Goal: Risk for activity intolerance will decrease Outcome: Progressing   Problem: Nutrition: Goal: Adequate nutrition will be maintained Outcome: Progressing   Problem: Coping: Goal: Level of anxiety will decrease Outcome: Progressing   Problem: Elimination: Goal: Will not experience complications related to bowel motility Outcome: Progressing Goal: Will not experience complications related to urinary retention Outcome: Progressing   Problem: Pain Managment: Goal: General experience of comfort will improve Outcome: Progressing   Problem: Safety: Goal: Ability to remain free from injury will improve Outcome: Progressing   Problem: Skin Integrity: Goal: Risk for impaired skin integrity will decrease Outcome: Progressing   Problem: Education: Goal: Knowledge of disease or condition will improve Outcome: Progressing Goal: Understanding of discharge needs will improve Outcome: Progressing   Problem: Health Behavior/Discharge Planning: Goal: Ability to identify changes in lifestyle to reduce recurrence of condition will improve Outcome: Progressing Goal: Identification of resources available to assist in meeting health  care needs will improve Outcome: Progressing   Problem: Physical Regulation: Goal: Complications related to the disease process, condition or treatment will be avoided or minimized Outcome: Progressing   Problem: Safety: Goal: Ability to remain free from injury will improve Outcome: Progressing   Problem: Activity: Goal: Ability to tolerate increased activity will improve Outcome: Progressing   Problem: Clinical Measurements: Goal: Ability to maintain a body temperature in the normal range will improve Outcome: Progressing   Problem: Respiratory: Goal: Ability to maintain adequate ventilation will improve Outcome: Progressing Goal: Ability to maintain a clear airway will improve Outcome: Progressing

## 2021-01-11 NOTE — Progress Notes (Signed)
Progress Note    [name]  XVQ:[id number] DOB: 08-Apr-1975  DOA: 12/17/2020 PCP: Eunice Blase, PA-C      Brief Narrative:    Medical records reviewed and are as summarized below:  [name] is a 45 y.o. female       Assessment/Plan:   Principal Problem:   Alcohol use disorder, moderate, dependence ([name]) Active Problems:   Alcohol withdrawal ([name])   Bipolar 2 disorder ([name])   Hypokalemia   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[name])   Acute hyponatremia   Hepatic steatosis   Polysubstance abuse ([name])   Hyperglycemia due to type 2 diabetes mellitus ([name])   Hyperbilirubinemia   Lactic acidosis   Alcoholic liver disease ([name])   Sepsis ([name])   Acute hepatic encephalopathy   Pressure injury of skin   Aspiration pneumonia of both lower lobes due to gastric secretions ([name])   Nutrition Problem: Inadequate oral intake Etiology: acute illness  Signs/Symptoms: per patient/family report   Body mass index is 37.43 kg/m.  (Morbid obesity)  Sepsis, aspiration pneumonia, acute UTI, persistent leukocytosis: She is still on IV cefepime.  Follow-up with ID for further recommendations.  Alcoholic liver cirrhosis with ascites, liver failure, acute hepatic encephalopathy, hypoalbuminemia, associated severe pulmonary hypertension: Continue Lasix, Aldactone and rifaximin.  Plan for paracentesis tomorrow.  Alcohol use disorder, polysubstance use disorder: Counseled to quit using alcohol and illicit drugs.  Bipolar disorder: Continue psychotropics.  Follow-up with psychiatrist.  Hyponatremia, hypokalemia, hypomagnesemia and hypokalemia phosphatemia: Improved.  Continue to monitor levels.  Anemia of chronic disease: H&H is stable.  Diet Order             Diet Carb Modified Fluid consistency: Thin; Room service appropriate? Yes  Diet effective now                      Consultants: Infectious disease, psychiatry, gastroenterology,  nephrology  Procedures: Paracentesis on 12/30/2020 and 01/06/2021 Right-sided tunneled central catheter on 12/30/2020    Medications:    budesonide  9 mg Oral Daily   feeding supplement  237 mL Oral TID BM   FLUoxetine  80 mg Oral Daily   fluticasone  1 spray Each Nare Daily   folic acid  1 mg Oral Daily   furosemide  40 mg Oral Daily   insulin aspart  0-20 Units Subcutaneous TID WC   insulin aspart  0-5 Units Subcutaneous QHS   insulin aspart  6 Units Subcutaneous TID WC   insulin glargine-yfgn  26 Units Subcutaneous QHS   multivitamin with minerals  1 tablet Oral Daily   OLANZapine  5 mg Oral QHS   pantoprazole  40 mg Oral BID   pregabalin  75 mg Oral TID   rifaximin  550 mg Oral BID   spironolactone  100 mg Oral Daily   thiamine  100 mg Oral Daily   Vitamin D (Ergocalciferol)  50,000 Units Oral Q7 days   Continuous Infusions:  sodium chloride Stopped (12/31/20 0118)   ceFEPime (MAXIPIME) IV 2 g (01/11/21 1129)     Anti-infectives (From admission, onward)    Start     Dose/Rate Route Frequency Ordered Stop   01/10/21 1400  rifaximin (XIFAXAN) tablet 550 mg        550 mg Oral 2 times daily 01/10/21 1302     01/09/21 1800  ceFEPIme (MAXIPIME) 2 g in sodium chloride 0.9 % 100 mL IVPB        2 g  200 mL/hr over 30 Minutes Intravenous Every 12 hours 01/09/21 1533     01/07/21 1800  vancomycin (VANCOCIN) capsule 125 mg  Status:  Discontinued        125 mg Oral 4 times daily 01/07/21 1511 01/09/21 1924   01/06/21 1400  vancomycin (VANCOREADY) IVPB 2000 mg/400 mL  Status:  Discontinued        2,000 mg 200 mL/hr over 120 Minutes Intravenous Every 24 hours 01/05/21 1400 01/06/21 1114   01/05/21 1415  vancomycin (VANCOCIN) 2,000 mg in sodium chloride 0.9 % 500 mL IVPB  Status:  Discontinued        2,000 mg 250 mL/hr over 120 Minutes Intravenous  Once 01/05/21 1315 01/05/21 1317   01/05/21 1415  vancomycin (VANCOREADY) IVPB 2000 mg/400 mL        2,000 mg 200 mL/hr over 120  Minutes Intravenous  Once 01/05/21 1318 01/05/21 1803   01/04/21 1415  Ampicillin-Sulbactam (UNASYN) 3 g in sodium chloride 0.9 % 100 mL IVPB  Status:  Discontinued        3 g 200 mL/hr over 30 Minutes Intravenous Every 6 hours 01/04/21 1317 01/07/21 1031   12/22/20 2200  linezolid (ZYVOX) tablet 600 mg  Status:  Discontinued        600 mg Oral Every 12 hours 12/22/20 1143 12/27/20 1213   12/20/20 2200  vancomycin (VANCOCIN) IVPB 1000 mg/200 mL premix  Status:  Discontinued        1,000 mg 200 mL/hr over 60 Minutes Intravenous Every 12 hours 12/20/20 0839 12/22/20 1143   12/20/20 0930  vancomycin (VANCOCIN) 2,000 mg in sodium chloride 0.9 % 500 mL IVPB  Status:  Discontinued        2,000 mg 250 mL/hr over 120 Minutes Intravenous  Once 12/20/20 0839 12/20/20 0907   12/20/20 0930  vancomycin (VANCOCIN) 2,000 mg in sodium chloride 0.9 % 500 mL IVPB        2,000 mg 250 mL/hr over 120 Minutes Intravenous  Once 12/20/20 0907 12/20/20 1223   12/19/20 1630  rifaximin (XIFAXAN) tablet 550 mg  Status:  Discontinued        550 mg Oral 2 times daily 12/19/20 1533 01/07/21 1511   12/18/20 1200  Ampicillin-Sulbactam (UNASYN) 3 g in sodium chloride 0.9 % 100 mL IVPB  Status:  Discontinued        3 g 200 mL/hr over 30 Minutes Intravenous Every 6 hours 12/18/20 1038 12/20/20 0839   12/18/20 0045  Ampicillin-Sulbactam (UNASYN) 3 g in sodium chloride 0.9 % 100 mL IVPB        3 g 200 mL/hr over 30 Minutes Intravenous  Once 12/18/20 0033 12/18/20 0303              Family Communication/Anticipated D/C date and plan/Code Status   DVT prophylaxis:      Code Status: Full Code  Family Communication: None Disposition Plan: Awaiting placement to SNF    Status is: Inpatient  Remains inpatient appropriate because: Awaiting placement to rehab           Subjective:   Interval events noted.  She complains of distended abdomen.  No abdominal pain, shortness of breath or chest  pain  Objective:    Vitals:   01/11/21 0507 01/11/21 0558 01/11/21 0741 01/11/21 1537  BP: 129/71 (!) 103/57 120/62 127/75  Pulse: (!) 115 (!) 110 (!) 109 (!) 108  Resp: 16 16 18 18   Temp: 98 F (36.7 C) 98.2 F (36.8 C)  98.8 F (37.1 C) 98.5 F (36.9 C)  TempSrc: Oral Oral Oral Oral  SpO2: 94% 95% 95% 93%  Weight:      Height:       No data found.   Intake/Output Summary (Last 24 hours) at 01/11/2021 1654 Last data filed at 01/11/2021 1128 Gross per 24 hour  Intake --  Output 1700 ml  Net -1700 ml   Filed Weights   01/09/21 0522 01/10/21 0500 01/11/21 0500  Weight: 107.6 kg 108.5 kg 108.4 kg    Exam:  GEN: NAD SKIN: No rash EYES: EOMI, icteric ENT: MMM CV: RRR PULM: CTA B ABD: soft, distended, NT, +BS CNS: AAO x 3, non focal EXT: No edema or tenderness        Data Reviewed:   I have personally reviewed following labs and imaging studies:  Labs: Labs show the following:   Basic Metabolic Panel: Recent Labs  Lab 01/05/21 0556 01/06/21 0631 01/07/21 0449 01/08/21 0442 01/09/21 0511 01/10/21 0518 01/11/21 0600  NA 136   < > 135 133* 135 133* 133*  K 3.1*   < > 3.3* 4.0 3.6 3.8 3.9  CL 89*   < > 90* 88* 87* 91* 92*  CO2 37*   < > 34* 35* 34* 35* 35*  GLUCOSE 179*   < > 254* 225* 161* 264* 247*  BUN 19   < > 18 17 20 20 18   CREATININE 0.48   < > 0.54 0.53 0.55 0.55 0.69  CALCIUM 7.3*   < > 7.4* 7.8* 8.0* 8.4* 8.1*  MG 1.7   < > 1.8 1.6* 1.8 1.6* 2.0  PHOS 2.7  --   --  3.0  --   --  3.5   < > = values in this interval not displayed.   GFR Estimated Creatinine Clearance: 113.8 mL/min (by C-G formula based on SCr of 0.69 mg/dL). Liver Function Tests: Recent Labs  Lab 01/06/21 0631 01/08/21 0442 01/09/21 0511 01/10/21 0518 01/11/21 0600  AST 158* 154* 143* 139* 128*  ALT 30 28 29 29 28   ALKPHOS 198* 201* 219* 239* 232*  BILITOT 9.8* 9.8* 9.6* 9.3* 8.3*  PROT 6.3* 6.5 6.5 6.4* 6.5  ALBUMIN <1.5* 1.5* <1.5* <1.5* <1.5*   No  results for input(s): LIPASE, AMYLASE in the last 168 hours. Recent Labs  Lab 01/10/21 1000  AMMONIA 43*   Coagulation profile No results for input(s): INR, PROTIME in the last 168 hours.  CBC: Recent Labs  Lab 01/07/21 0449 01/08/21 0442 01/09/21 0511 01/10/21 0518 01/11/21 0600  WBC 28.4* 32.8* 34.0* 29.7* 28.4*  NEUTROABS 24.5* 28.1* 28.6* 24.7* 23.2*  HGB 8.2* 8.0* 8.2* 8.4* 8.2*  HCT 26.3* 25.9* 26.0* 26.9* 26.7*  MCV 114.8* 113.1* 115.0* 115.5* 114.1*  PLT 186 250 282 305 289   Cardiac Enzymes: No results for input(s): CKTOTAL, CKMB, CKMBINDEX, TROPONINI in the last 168 hours. BNP (last 3 results) No results for input(s): PROBNP in the last 8760 hours. CBG: Recent Labs  Lab 01/10/21 1618 01/10/21 2140 01/11/21 0743 01/11/21 1131 01/11/21 1647  GLUCAP 290* 238* 212* 256* 353*   D-Dimer: No results for input(s): DDIMER in the last 72 hours. Hgb A1c: No results for input(s): HGBA1C in the last 72 hours. Lipid Profile: No results for input(s): CHOL, HDL, LDLCALC, TRIG, CHOLHDL, LDLDIRECT in the last 72 hours. Thyroid function studies: No results for input(s): TSH, T4TOTAL, T3FREE, THYROIDAB in the last 72 hours.  Invalid input(s): FREET3 Anemia work up: No  results for input(s): VITAMINB12, FOLATE, FERRITIN, TIBC, IRON, RETICCTPCT in the last 72 hours. Sepsis Labs: Recent Labs  Lab 01/05/21 0556 01/06/21 0631 01/07/21 0449 01/08/21 0442 01/09/21 0511 01/10/21 0518 01/11/21 0600  PROCALCITON 2.73 2.54  --   --   --   --   --   WBC 26.1* 26.8*   < > 32.8* 34.0* 29.7* 28.4*   < > = values in this interval not displayed.    Microbiology Recent Results (from the past 240 hour(s))  Cath Tip Culture     Status: None   Collection Time: 01/04/21  1:59 PM   Specimen: Catheter Tip; Other  Result Value Ref Range Status   Specimen Description   Final    CATH TIP Performed at [facility], [address], [place], [place] [address]    Special  Requests   Final    NONE Performed at [facility], [address], [place], [place] [address]    Culture   Final    NO GROWTH 2 DAYS Performed at [facility], [address], [place], [place] [address]    Report Status 01/07/2021 FINAL  Final  Culture, blood (single) w Reflex to ID Panel     Status: None   Collection Time: 01/04/21  2:00 PM   Specimen: BLOOD  Result Value Ref Range Status   Specimen Description BLOOD ALIN  Final   Special Requests   Final    BOTTLES DRAWN AEROBIC AND ANAEROBIC Blood Culture results may not be optimal due to an excessive volume of blood received in culture bottles   Culture   Final    NO GROWTH 5 DAYS Performed at [facility], [address], [place], [place] [address]    Report Status 01/09/2021 FINAL  Final  Culture, blood (single) w Reflex to ID Panel     Status: None   Collection Time: 01/04/21  2:35 PM   Specimen: BLOOD  Result Value Ref Range Status   Specimen Description BLOOD LEFT ANTECUBITAL  Final   Special Requests   Final    BOTTLES DRAWN AEROBIC AND ANAEROBIC Blood Culture adequate volume   Culture   Final    NO GROWTH 5 DAYS Performed at [facility], [address] [place], [place] [address]    Report Status 01/09/2021 FINAL  Final  Body fluid culture w Gram Stain     Status: None   Collection Time: 01/06/21 10:55 AM   Specimen: PATH Cytology Peritoneal fluid  Result Value Ref Range Status   Specimen Description   Final    PERITONEAL Performed at [facility], [address], [place], [place] [address]    Special Requests   Final    NONE Performed at [facility], [address] [place], [place] [address]    Gram Stain   Final    FEW WBC PRESENT, PREDOMINANTLY MONONUCLEAR NO ORGANISMS SEEN    Culture   Final    NO GROWTH 3 DAYS Performed at [facility], [address] [place], [place] [address]    Report Status 01/09/2021 FINAL  Final  Urine  Culture     Status: Abnormal   Collection Time: 01/09/21  5:30 AM   Specimen: Urine, Clean Catch  Result Value Ref Range Status   Specimen Description   Final    URINE, CLEAN CATCH Performed at [facility], [address], [place], [place] [address]    Special Requests  Final    NONE Performed at [facility], [address] [place], [place] [address]    Culture (A)  Final    30,000 COLONIES/mL MULTIPLE SPECIES PRESENT, SUGGEST RECOLLECTION   Report Status 01/10/2021 FINAL  Final    Procedures and diagnostic studies:  DG Abd 1 View  Result Date: 01/10/2021 CLINICAL DATA:  Abdomen pain fluid retention EXAM: ABDOMEN - 1 VIEW COMPARISON:  MRI 12/23/2020 FINDINGS: Limited by habitus. Overall nonobstructed gas pattern. No radiopaque calculi. IMPRESSION: Nonobstructed gas pattern Electronically Signed   By: Jasmine Pang M.D.   On: 01/10/2021 15:47               LOS: 24 days   Padme Arriaga  [facility]   Pager on [email or web address]. If 7PM-7AM, please contact night-coverage at [email or web address]     01/11/2021, 4:54 PM

## 2021-01-11 NOTE — Care Management Important Message (Signed)
Important Message  Patient Details  Name: [name] MRN: [id number] Date of Birth: Mar 06, 1976   Medicare Important Message Given:  Yes     Olegario Messier A Sherlonda Flater 01/11/2021, 3:12 PM

## 2021-01-11 NOTE — TOC Progression Note (Addendum)
Transition of Care [facility]) - Progression Note    Patient Details  Name: [name] MRN: [id number] Date of Birth: 1975/07/03  Transition of [facility] CM/SW Contact  Margarito Liner, LCSW Phone Number: 01/11/2021, 8:48 AM  Clinical Narrative:  PASARR obtained: [phone] F. Expires 04/09/2021.   10:27 am: Patient is still without any bed offers. Spoke to patient to provide update. Told her she may have to discharge home once stable enough. She's unsure who she would go home with so encouraged her to start calling family now to plan ahead. Encouraged her to work as hard as possible with PT/OT to get her as close to baseline as they can before discharge.  Expected Discharge Plan: Skilled Nursing Facility Barriers to Discharge: Continued Medical Work up  Expected Discharge Plan and Services Expected Discharge Plan: Skilled Nursing Facility     Post Acute Care Choice: Skilled Nursing Facility Living arrangements for the past 2 months: Single Family Home                                       Social Determinants of Health (SDOH) Interventions    Readmission Risk Interventions No flowsheet data found.

## 2021-01-11 NOTE — Progress Notes (Signed)
Physical Therapy Treatment Patient Details Name: [name] MRN: [id number] DOB: December 12, 1975 Today's Date: 01/11/2021   History of Present Illness 45 y.o. female with medical history significant for Bipolar disorder, diabetes, alcohol use disorder and polysubstance abuse, FSH muscular dystrophy, with history of frequent falls, last seen by neurology in January 2020, who was brought in by EMS after she fell at home 2 days prior and was unable to get up.    PT Comments    Co-treat with OT this date. Pt very willing to participate. Attempted to use sit<>Stand lift for improved endurance/tolerance for standing, however lift not functioning and appropriate size sling not available. +2 required for all standing attempts. RW used with required B knee blocked. Unable to fully stand upright and cues for hip extension. Unsafe for pre gait activities this session. Only able to stand for max 26 seconds due to fatigue. Session performed in 2 bouts with pt needing to have BM. Will continue to progress as able. Pt requesting frequent therapy as limited options for discharge at this time per discussion with care team. Will continue efforts.   Recommendations for follow up therapy are one component of a multi-disciplinary discharge planning process, led by the attending physician.  Recommendations may be updated based on patient status, additional functional criteria and insurance authorization.  Follow Up Recommendations  Skilled nursing-short term rehab (<3 hours/day)     Assistance Recommended at Discharge Frequent or constant Supervision/Assistance  Equipment Recommendations   (TBD)    Recommendations for Other Services       Precautions / Restrictions Precautions Precautions: Fall Restrictions Weight Bearing Restrictions: No     Mobility  Bed Mobility Overal bed mobility: Needs Assistance Bed Mobility: Supine to Sit     Supine to sit: Supervision Sit to supine: Min assist   General bed  mobility comments: improved ease of mobility with heavy use of B UE for coming to EOB. On return supine, needs min assist to slide up towards HOB. Limited ability to assist with B LEs    Transfers Overall transfer level: Needs assistance Equipment used: Rolling walker (2 wheels) Transfers: Sit to/from Stand;Lateral/Scoot Transfers Sit to Stand: Mod assist;+2 physical assistance;From elevated surface          Lateral/Scoot Transfers: Max assist General transfer comment: able to stand x 2 reps with last rep able to stand for 26 secs. B knees blocked due to weakness and RW used. Lateral scoot attempted for repositioning, however needs max assist for performance.    Ambulation/Gait             General Gait Details: unsafe at this time   Stairs             Wheelchair Mobility    Modified Rankin (Stroke Patients Only)       Balance Overall balance assessment: Needs assistance Sitting-balance support: Feet supported Sitting balance-Leahy Scale: Good     Standing balance support: Bilateral upper extremity supported;Reliant on assistive device for balance Standing balance-Leahy Scale: Fair                              Cognition Arousal/Alertness: Awake/alert Behavior During Therapy: WFL for tasks assessed/performed Overall Cognitive Status: Within Functional Limits for tasks assessed                                 General Comments: agreeable  to therapy. Willing to participate        Exercises Other Exercises Other Exercises: Session limited due to urgent BM. Assisted with rolling for bed pan placement.    General Comments        Pertinent Vitals/Pain Pain Assessment: No/denies pain    Home Living                          Prior Function            PT Goals (current goals can now be found in the care plan section) Acute Rehab PT Goals Patient Stated Goal: to get stronger and stand again PT Goal Formulation:  With patient Time For Goal Achievement: 01/18/21 Potential to Achieve Goals: Fair Progress towards PT goals: Progressing toward goals    Frequency    Min 2X/week      PT Plan Current plan remains appropriate    Co-evaluation PT/OT/SLP Co-Evaluation/Treatment: Yes Reason for Co-Treatment: For patient/therapist safety;To address functional/ADL transfers PT goals addressed during session: Mobility/safety with mobility;Strengthening/ROM;Balance OT goals addressed during session: Strengthening/ROM;ADL's and self-care      AM-PAC PT "6 Clicks" Mobility   Outcome Measure  Help needed turning from your back to your side while in a flat bed without using bedrails?: A Little Help needed moving from lying on your back to sitting on the side of a flat bed without using bedrails?: A Little Help needed moving to and from a bed to a chair (including a wheelchair)?: A Lot Help needed standing up from a chair using your arms (e.g., wheelchair or bedside chair)?: A Lot Help needed to walk in hospital room?: Total Help needed climbing 3-5 steps with a railing? : Total 6 Click Score: 12    End of Session Equipment Utilized During Treatment: Gait belt;Oxygen Activity Tolerance: Patient limited by fatigue Patient left: in bed;with family/visitor present Nurse Communication: Mobility status PT Visit Diagnosis: Other abnormalities of gait and mobility (R26.89);Muscle weakness (generalized) (M62.81);History of falling (Z91.81)     Time: 1144 (1200)-1217 (1228) PT Time Calculation (min) (ACUTE ONLY): 33 min  Charges:  $Therapeutic Activity: 8-22 mins                     [name], PT, DPT [phone]    [name] 01/11/2021, 3:25 PM

## 2021-01-11 NOTE — Progress Notes (Signed)
Occupational Therapy Treatment Patient Details Name: [name] MRN: [id number] DOB: Jan 24, 1976 Today's Date: 01/11/2021   History of present illness 45 y.o. female with medical history significant for Bipolar disorder, diabetes, alcohol use disorder and polysubstance abuse, FSH muscular dystrophy, with history of frequent falls, last seen by neurology in January 2020, who was brought in by EMS after she fell at home 2 days prior and was unable to get up.   OT comments  Pt seen for OT Co-tx with PT this date to maximize services as pt is with limited fxl activity tolerance. Pt agreeable to attempting to stand and less fearful this date. Pt requires MOD A +2 with bilateral foot/knee blocking and cues for use of RW for safety/sequencing hand placement. OT educates re: rationale. Pt also requires cues for activity pacing as she is noted to have increased RR with sup to sit requiring rest break before attempting standing. Entire treatment session completed over two visits as pt with need to have BM requesting break to use bed pan, but still wanting to stand again with therapy. Pt requires MIN A for rolling to place bed pan. After second standing trial, she is returned to bed with MIN/MOD A +2 for repositioning. HOB elevated in prep for meal time. All needs met and in reach. Visitor present throughout. Will contin to follow acutely. Continue to anticipate that pt will require STR d/t significant deconditioning.    Recommendations for follow up therapy are one component of a multi-disciplinary discharge planning process, led by the attending physician.  Recommendations may be updated based on patient status, additional functional criteria and insurance authorization.    Follow Up Recommendations  Skilled nursing-short term rehab (<3 hours/day)    Assistance Recommended at Discharge    Equipment Recommendations  [facility]    Recommendations for Other Services      Precautions / Restrictions  Precautions Precautions: Fall Restrictions Weight Bearing Restrictions: No       Mobility Bed Mobility Overal bed mobility: Needs Assistance Bed Mobility: Supine to Sit;Sit to Supine     Supine to sit: Supervision Sit to supine: Min assist   General bed mobility comments: MIN A with scooting and lifting LEs back to bed once fatigued    Transfers Overall transfer level: Needs assistance Equipment used: Rolling walker (2 wheels) Transfers: Sit to/from Stand;Lateral/Scoot Transfers Sit to Stand: Mod assist;+2 physical assistance;From elevated surface          Lateral/Scoot Transfers: Max assist General transfer comment: increased time, EOB elevated ~6", encouragement, cues for hand placement/ed re: rationale for sequence of steps of RW use. Pt requires bilateral knee/foot blocking.     Balance Overall balance assessment: Needs assistance Sitting-balance support: Feet supported Sitting balance-Leahy Scale: Good Sitting balance - Comments: keeps B hands for support on bed   Standing balance support: Bilateral upper extremity supported;Reliant on assistive device for balance Standing balance-Leahy Scale: Poor Standing balance comment: requires MOD A +2 to come to static stand, MIN A +2 to sustain                           ADL either performed or assessed with clinical judgement   ADL Overall ADL's : Needs assistance/impaired                         Toilet Transfer: Minimal assistance Toilet Transfer Details (indicate cue type and reason): bed level rolling, bed pan  Vision Patient Visual Report: No change from baseline     Perception     Praxis      Cognition Arousal/Alertness: Awake/alert Behavior During Therapy: WFL for tasks assessed/performed Overall Cognitive Status: Within Functional Limits for tasks assessed                                 General Comments: more wiling to participate and less  fearful of falling this date          Exercises Other Exercises Other Exercises: Pt educated re: safety with ADLs for STS, correct use of AD Other Exercises: Session limited due to urgent BM. Assisted with rolling for bed pan placement.   Shoulder Instructions       General Comments      Pertinent Vitals/ Pain       Pain Assessment: No/denies pain Faces Pain Scale: Hurts little more Pain Location: generalized - unable to describe location Pain Descriptors / Indicators: Discomfort Pain Intervention(s): Limited activity within patient's tolerance;Monitored during session;Repositioned  Home Living                                          Prior Functioning/Environment              Frequency  Min 2X/week        Progress Toward Goals  OT Goals(current goals can now be found in the care plan section)  Progress towards OT goals: Progressing toward goals  Acute Rehab OT Goals OT Goal Formulation: With patient Time For Goal Achievement: 01/17/21 Potential to Achieve Goals: [place] Discharge plan remains appropriate;Frequency remains appropriate    Co-evaluation    PT/OT/SLP Co-Evaluation/Treatment: Yes Reason for Co-Treatment: For patient/therapist safety;To address functional/ADL transfers PT goals addressed during session: Mobility/safety with mobility;Balance;Strengthening/ROM OT goals addressed during session: ADL's and self-care;Strengthening/ROM      AM-PAC OT "6 Clicks" Daily Activity     Outcome Measure   Help from another person eating meals?: None Help from another person taking care of personal grooming?: A Little Help from another person toileting, which includes using toliet, bedpan, or urinal?: A Lot Help from another person bathing (including washing, rinsing, drying)?: A Lot Help from another person to put on and taking off regular upper body clothing?: A Little Help from another person to put on and taking off regular  lower body clothing?: A Lot 6 Click Score: 16    End of Session Equipment Utilized During Treatment: Oxygen  OT Visit Diagnosis: Unsteadiness on feet (R26.81);Muscle weakness (generalized) (M62.81);History of falling (Z91.81)   Activity Tolerance Patient tolerated treatment well   Patient Left in bed;with call bell/phone within reach;with bed alarm set   Nurse Communication Mobility status        Time: [phone] (some overlap with time for session after this d/t coming at both 1146-1200, then from 1218-1228.) OT Time Calculation (min): 24 min  Charges: OT General Charges $OT Visit: 1 Visit OT Treatments $Self Care/Home Management : 8-22 mins  Gerrianne Scale, Lat[place], OTR/L ascom [phone] 01/11/21, 5:40 PM

## 2021-01-11 NOTE — Progress Notes (Signed)
Date of Admission:  12/17/2020    ID: [name] is a 45 y.o. female  Principal Problem:   Alcohol use disorder, moderate, dependence ([name]) Active Problems:   Alcohol withdrawal ([name])   Bipolar 2 disorder ([name])   Hypokalemia   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[name])   Acute hyponatremia   Hepatic steatosis   Polysubstance abuse ([name])   Hyperglycemia due to type 2 diabetes mellitus ([name])   Hyperbilirubinemia   Lactic acidosis   Alcoholic liver disease ([name])   Sepsis ([name])   Acute hepatic encephalopathy   Pressure injury of skin   Aspiration pneumonia of both lower lobes due to gastric secretions ([name])    Subjective: Patient states she is stable No diarrhea in in a few days since she stopped lactulose. No fever Weakness Appetite bedtime Some abdominal distention. Medications:   budesonide  9 mg Oral Daily   feeding supplement  237 mL Oral TID BM   FLUoxetine  80 mg Oral Daily   fluticasone  1 spray Each Nare Daily   folic acid  1 mg Oral Daily   furosemide  40 mg Oral Daily   insulin aspart  0-20 Units Subcutaneous TID WC   insulin aspart  0-5 Units Subcutaneous QHS   insulin aspart  6 Units Subcutaneous TID WC   insulin glargine-yfgn  26 Units Subcutaneous QHS   multivitamin with minerals  1 tablet Oral Daily   OLANZapine  5 mg Oral QHS   pantoprazole  40 mg Oral BID   pregabalin  75 mg Oral TID   rifaximin  550 mg Oral BID   spironolactone  100 mg Oral Daily   thiamine  100 mg Oral Daily   Vitamin D (Ergocalciferol)  50,000 Units Oral Q7 days    Objective: Vital signs in last 24 hours: Temp:  [97.7 F (36.5 C)-98.8 F (37.1 C)] 98.8 F (37.1 C) (10/26 0741) Pulse Rate:  [101-115] 109 (10/26 0741) Resp:  [16-18] 18 (10/26 0741) BP: (103-129)/(57-81) 120/62 (10/26 0741) SpO2:  [94 %-95 %] 95 % (10/26 0741) Weight:  [108.4 kg] 108.4 kg (10/26 0500)  PHYSICAL EXAM:  General: Alert, cooperative, chronically ill.  Icteric.  Lips, mucosa, and  tongue normal. No Thrush Neck: Supple, symmetrical, no adenopathy, thyroid: non tender no carotid bruit and no JVD. Bile lungs: Lateral air entry.  Decreased bases.Marland Kitchen Heart: Regular.  Abdomen: Soft, tender. Extremities: atraumatic, no cyanosis. No edema. No clubbing Skin: Bruising lower extremities. Lymph: Cervical, supraclavicular normal. Neurologic: Grossly non-focal  Lab Results Recent Labs    01/10/21 0518 01/11/21 0600  WBC 29.7* 28.4*  HGB 8.4* 8.2*  HCT 26.9* 26.7*  NA 133* 133*  K 3.8 3.9  CL 91* 92*  CO2 35* 35*  BUN 20 18  CREATININE 0.55 0.69   Liver Panel Recent Labs    01/10/21 0518 01/11/21 0600  PROT 6.4* 6.5  ALBUMIN <1.5* <1.5*  AST 139* 128*  ALT 29 28  ALKPHOS 239* 232*  BILITOT 9.3* 8.3*   Sedimentation Rate No results for input(s): ESRSEDRATE in the last 72 hours. C-Reactive Protein No results for input(s): CRP in the last 72 hours.  Microbiology: 01/09/2021 urine culture 30,000 colonies of mixed species 01/06/2021 abdominal fluid culture no growth 01/04/2021 blood cultures no growth  10-22 blood culture no growth 01/04/2021 catheter tip no growth Studies/Results: DG Abd 1 View  Result Date: 01/10/2021 CLINICAL DATA:  Abdomen pain fluid retention EXAM: ABDOMEN - 1 VIEW COMPARISON:  MRI 12/23/2020 FINDINGS:  Limited by habitus. Overall nonobstructed gas pattern. No radiopaque calculi. IMPRESSION: Nonobstructed gas pattern Electronically Signed   By: Jasmine Pang M.D.   On: 01/10/2021 15:47     Assessment/Plan: Leukocytosis slowly improving No evidence of clinically UTI or pneumonia.  C. difficile also currently is not a concern. Currently on cefepime for possible UTI which I will discontinue today.   Symptoms diarrhea resolved since stopping it  Alcoholic hepatitis  Marked hepatomegaly with steatohepatitis and ascites No secondary bacterial peritonitis.  Acetic fluid is reaccumulating.  May need paracentesis.    Hepatic  encephalopathy which is improved   Discussed the management with the patient.

## 2021-01-11 NOTE — Progress Notes (Signed)
Inpatient Diabetes Program Recommendations  AACE/ADA: New Consensus Statement on Inpatient Glycemic Control   Target Ranges:  Prepandial:   less than 140 mg/dL      Peak postprandial:   less than 180 mg/dL (1-2 hours)      Critically ill patients:  140 - 180 mg/dL   Results for PERLITA, FORBUSH (MRN [id number]) as of 01/11/2021 08:53  Ref. Range 01/10/2021 07:59 01/10/2021 11:50 01/10/2021 16:18 01/10/2021 21:40 01/11/2021 07:43  Glucose-Capillary Latest Ref Range: 70 - 99 mg/dL 355 (H)  Novolog 17 units 231 (H)  Novolog 13 units 290 (H)  Novolog 17 units 238 (H)  Novolog 2 units  Semglee 26 units 212 (H)    Review of Glycemic Control  Admit with: Fall/ Aspiration Pneumonia/ Sepsis/ Hyponatremia/ Alcohol liver disease with hepatic steatosis and ascites Severe hyperbilirubinemia/jaundice/ Elevated ammonia level   History: DM2, Bipolar disorder, Polysubstance Abuse Home DM Meds: Janumet XR 100/1000 mg Daily                             Metformin 500 mg BID                             Ozempic Qweek Current orders for Inpatient glycemic control: Semglee 26 units QHS, Novolog 6 units TID with meals, Novolog 0-20 units TID with meals, Novolog 0-5 units QHS; Entocort 9 mg daily   Inpatient Diabetes Program Recommendations:     Insulin: Please consider increasing Semglee to 28 units QHS and meal coverage to Novolog 10 units TID with meals.   Thanks, Orlando Penner, RN, MSN, CDE Diabetes Coordinator Inpatient Diabetes Program [phone] (Team Pager from 8am to 5pm)

## 2021-01-12 ENCOUNTER — Inpatient Hospital Stay: Payer: Medicare Other

## 2021-01-12 DIAGNOSIS — K709 Alcoholic liver disease, unspecified: Secondary | ICD-10-CM | POA: Diagnosis not present

## 2021-01-12 DIAGNOSIS — E871 Hypo-osmolality and hyponatremia: Secondary | ICD-10-CM | POA: Diagnosis not present

## 2021-01-12 DIAGNOSIS — K7011 Alcoholic hepatitis with ascites: Secondary | ICD-10-CM | POA: Diagnosis not present

## 2021-01-12 DIAGNOSIS — F1020 Alcohol dependence, uncomplicated: Secondary | ICD-10-CM | POA: Diagnosis not present

## 2021-01-12 DIAGNOSIS — K7682 Hepatic encephalopathy: Secondary | ICD-10-CM | POA: Diagnosis not present

## 2021-01-12 LAB — GLUCOSE, CAPILLARY
Glucose-Capillary: 198 mg/dL — ABNORMAL HIGH (ref 70–99)
Glucose-Capillary: 298 mg/dL — ABNORMAL HIGH (ref 70–99)
Glucose-Capillary: 271 mg/dL — ABNORMAL HIGH (ref 70–99)
Glucose-Capillary: 167 mg/dL — ABNORMAL HIGH (ref 70–99)

## 2021-01-12 LAB — BASIC METABOLIC PANEL
Anion gap: 7 (ref 5–15)
BUN: 17 mg/dL (ref 6–20)
CO2: 32 mmol/L (ref 22–32)
Calcium: 8.6 mg/dL — ABNORMAL LOW (ref 8.9–10.3)
Chloride: 94 mmol/L — ABNORMAL LOW (ref 98–111)
Creatinine, Ser: 0.62 mg/dL (ref 0.44–1.00)
GFR, Estimated: >60 mL/min (ref >60)
Glucose, Bld: 325 mg/dL — ABNORMAL HIGH (ref 70–99)
Potassium: 4.4 mmol/L (ref 3.5–5.1)
Sodium: 133 mmol/L — ABNORMAL LOW (ref 135–145)

## 2021-01-12 LAB — MAGNESIUM: Magnesium: 1.7 mg/dL (ref 1.7–2.4)

## 2021-01-12 MED ORDER — INSULIN GLARGINE-YFGN 100 UNIT/ML ~~LOC~~ SOLN
18.0000 [IU] | Freq: Two times a day (BID) | SUBCUTANEOUS | Status: DC
  Administered 2021-01-12 – 2021-01-13 (×3): 18 [IU] via SUBCUTANEOUS
  Filled 2021-01-12 (×4): qty 0.18

## 2021-01-12 NOTE — Progress Notes (Signed)
Progress Note    [name]  ZOX:[id number] DOB: 12/29/1975  DOA: 12/17/2020 PCP: Eunice Blase, PA-C      Brief Narrative:    Medical records reviewed and are as summarized below:  [name] is a 45 y.o. female with medical history significant for bipolar disorder, type 2 diabetes mellitus, alcohol use disorder, polysubstance use disorder, FSHD muscular dystrophy, history of falls, who was brought to the hospital because of a fall and inability to get up on her own.  She was found to have sepsis secondary to aspiration pneumonia, severe hyponatremia with sodium level of 113, hypokalemia with potassium of 2.9, hyperbilirubinemia with bilirubin of 11.9 and elevated ammonia of 52.  She was treated with empiric IV antibiotics and IV fluids.  Magnesium, phosphorus and potassium were repleted.  She was placed on 2 L/min oxygen for acute hypoxemic respiratory failure.  She was given lactulose and rifaximin for hepatic encephalopathy.  She underwent paracentesis for ascites.  After completing antibiotic therapy (Unasyn and Zyvox), she continued to have persistent leukocytosis and she was found to have UTI.  ID was consulted to assist with management.    Assessment/Plan:   Principal Problem:   Alcohol use disorder, moderate, dependence ([name]) Active Problems:   Alcohol withdrawal ([name])   Bipolar 2 disorder ([name])   Hypokalemia   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[name])   Acute hyponatremia   Hepatic steatosis   Polysubstance abuse ([name])   Hyperglycemia due to type 2 diabetes mellitus ([name])   Hyperbilirubinemia   Lactic acidosis   Alcoholic liver disease ([name])   Sepsis ([name])   Acute hepatic encephalopathy   Pressure injury of skin   Aspiration pneumonia of both lower lobes due to gastric secretions ([name])   Nutrition Problem: Inadequate oral intake Etiology: acute illness  Signs/Symptoms: per patient/family report   Body mass index is 36.98 kg/m.  (Morbid  obesity)  Sepsis secondary to aspiration pneumonia, acute UTI, persistent leukocytosis: Completed IV cefepime on 01/11/2021.  Follow-up with ID for further recommendations.  Alcoholic liver cirrhosis with ascites, liver failure, acute hepatic encephalopathy, hypoalbuminemia, associated severe pulmonary hypertension: Continue Lasix, Aldactone and rifaximin.  Plan for paracentesis today.  Alcohol use disorder, polysubstance use disorder: Continue thiamine and multivitamins.  Bipolar disorder: Continue psychotropics.  Follow-up with psychiatrist.  Type II DM with hyperglycemia: Increase insulin glargine from 26 nightly to 18 units twice daily.  Continue NovoLog and monitor glucose levels closely.  Hyponatremia, hypokalemia, hypomagnesemia and hypokalemia phosphatemia: Improved.  Continue to monitor levels.  Anemia of chronic disease: H&H is stable.  Diet Order             Diet Carb Modified Fluid consistency: Thin; Room service appropriate? Yes  Diet effective now                      Consultants: Infectious disease, psychiatry, gastroenterology, nephrology  Procedures: Paracentesis on 12/30/2020 and 01/06/2021 Right-sided tunneled central catheter on 12/30/2020    Medications:    budesonide  9 mg Oral Daily   feeding supplement  237 mL Oral TID BM   FLUoxetine  80 mg Oral Daily   fluticasone  1 spray Each Nare Daily   folic acid  1 mg Oral Daily   furosemide  40 mg Oral Daily   insulin aspart  0-20 Units Subcutaneous TID WC   insulin aspart  0-5 Units Subcutaneous QHS   insulin aspart  6 Units Subcutaneous TID WC  insulin glargine-yfgn  18 Units Subcutaneous BID   multivitamin with minerals  1 tablet Oral Daily   OLANZapine  5 mg Oral QHS   pantoprazole  40 mg Oral BID   pregabalin  75 mg Oral TID   rifaximin  550 mg Oral BID   spironolactone  100 mg Oral Daily   thiamine  100 mg Oral Daily   Vitamin D (Ergocalciferol)  50,000 Units Oral Q7 days    Continuous Infusions:  sodium chloride Stopped (12/31/20 0118)     Anti-infectives (From admission, onward)    Start     Dose/Rate Route Frequency Ordered Stop   01/10/21 1400  rifaximin (XIFAXAN) tablet 550 mg        550 mg Oral 2 times daily 01/10/21 1302     01/09/21 1800  ceFEPIme (MAXIPIME) 2 g in sodium chloride 0.9 % 100 mL IVPB  Status:  Discontinued        2 g 200 mL/hr over 30 Minutes Intravenous Every 12 hours 01/09/21 1533 01/12/21 0855   01/07/21 1800  vancomycin (VANCOCIN) capsule 125 mg  Status:  Discontinued        125 mg Oral 4 times daily 01/07/21 1511 01/09/21 1924   01/06/21 1400  vancomycin (VANCOREADY) IVPB 2000 mg/400 mL  Status:  Discontinued        2,000 mg 200 mL/hr over 120 Minutes Intravenous Every 24 hours 01/05/21 1400 01/06/21 1114   01/05/21 1415  vancomycin (VANCOCIN) 2,000 mg in sodium chloride 0.9 % 500 mL IVPB  Status:  Discontinued        2,000 mg 250 mL/hr over 120 Minutes Intravenous  Once 01/05/21 1315 01/05/21 1317   01/05/21 1415  vancomycin (VANCOREADY) IVPB 2000 mg/400 mL        2,000 mg 200 mL/hr over 120 Minutes Intravenous  Once 01/05/21 1318 01/05/21 1803   01/04/21 1415  Ampicillin-Sulbactam (UNASYN) 3 g in sodium chloride 0.9 % 100 mL IVPB  Status:  Discontinued        3 g 200 mL/hr over 30 Minutes Intravenous Every 6 hours 01/04/21 1317 01/07/21 1031   12/22/20 2200  linezolid (ZYVOX) tablet 600 mg  Status:  Discontinued        600 mg Oral Every 12 hours 12/22/20 1143 12/27/20 1213   12/20/20 2200  vancomycin (VANCOCIN) IVPB 1000 mg/200 mL premix  Status:  Discontinued        1,000 mg 200 mL/hr over 60 Minutes Intravenous Every 12 hours 12/20/20 0839 12/22/20 1143   12/20/20 0930  vancomycin (VANCOCIN) 2,000 mg in sodium chloride 0.9 % 500 mL IVPB  Status:  Discontinued        2,000 mg 250 mL/hr over 120 Minutes Intravenous  Once 12/20/20 0839 12/20/20 0907   12/20/20 0930  vancomycin (VANCOCIN) 2,000 mg in sodium chloride 0.9  % 500 mL IVPB        2,000 mg 250 mL/hr over 120 Minutes Intravenous  Once 12/20/20 0907 12/20/20 1223   12/19/20 1630  rifaximin (XIFAXAN) tablet 550 mg  Status:  Discontinued        550 mg Oral 2 times daily 12/19/20 1533 01/07/21 1511   12/18/20 1200  Ampicillin-Sulbactam (UNASYN) 3 g in sodium chloride 0.9 % 100 mL IVPB  Status:  Discontinued        3 g 200 mL/hr over 30 Minutes Intravenous Every 6 hours 12/18/20 1038 12/20/20 0839   12/18/20 0045  Ampicillin-Sulbactam (UNASYN) 3 g in sodium chloride 0.9 %  100 mL IVPB        3 g 200 mL/hr over 30 Minutes Intravenous  Once 12/18/20 0033 12/18/20 0303              Family Communication/Anticipated D/C date and plan/Code Status   DVT prophylaxis:      Code Status: Full Code  Family Communication: None Disposition Plan: Awaiting placement to SNF    Status is: Inpatient  Remains inpatient appropriate because: Awaiting placement to rehab           Subjective:   No new complaints.  She still complains of distended abdomen.  No abdominal pain or vomiting.   Objective:    Vitals:   01/11/21 1941 01/12/21 0339 01/12/21 0353 01/12/21 1145  BP: 118/80 137/82  127/76  Pulse: (!) 103 (!) 107  (!) 120  Resp: 18 20  20   Temp: 98 F (36.7 C) 98.3 F (36.8 C)    TempSrc: Oral Oral    SpO2: 98% 94%  95%  Weight:   107.1 kg   Height:       No data found.   Intake/Output Summary (Last 24 hours) at 01/12/2021 1211 Last data filed at 01/12/2021 0405 Gross per 24 hour  Intake --  Output 2200 ml  Net -2200 ml   Filed Weights   01/10/21 0500 01/11/21 0500 01/12/21 0353  Weight: 108.5 kg 108.4 kg 107.1 kg    Exam:  GEN: NAD SKIN: Warm and dry EYES: Icteric ENT: MMM CV: RRR PULM: CTA B ABD: soft, distended, NT, +BS CNS: AAO x 3, non focal EXT: Trace bilateral leg edema.  No erythema or tenderness       Data Reviewed:   I have personally reviewed following labs and imaging  studies:  Labs: Labs show the following:   Basic Metabolic Panel: Recent Labs  Lab 01/08/21 0442 01/09/21 0511 01/10/21 0518 01/11/21 0600 01/12/21 0800  NA 133* 135 133* 133* 133*  K 4.0 3.6 3.8 3.9 4.4  CL 88* 87* 91* 92* 94*  CO2 35* 34* 35* 35* 32  GLUCOSE 225* 161* 264* 247* 325*  BUN 17 20 20 18 17   CREATININE 0.53 0.55 0.55 0.69 0.62  CALCIUM 7.8* 8.0* 8.4* 8.1* 8.6*  MG 1.6* 1.8 1.6* 2.0 1.7  PHOS 3.0  --   --  3.5  --    GFR Estimated Creatinine Clearance: 113.1 mL/min (by C-G formula based on SCr of 0.62 mg/dL). Liver Function Tests: Recent Labs  Lab 01/06/21 0631 01/08/21 0442 01/09/21 0511 01/10/21 0518 01/11/21 0600  AST 158* 154* 143* 139* 128*  ALT 30 28 29 29 28   ALKPHOS 198* 201* 219* 239* 232*  BILITOT 9.8* 9.8* 9.6* 9.3* 8.3*  PROT 6.3* 6.5 6.5 6.4* 6.5  ALBUMIN <1.5* 1.5* <1.5* <1.5* <1.5*   No results for input(s): LIPASE, AMYLASE in the last 168 hours. Recent Labs  Lab 01/10/21 1000  AMMONIA 43*   Coagulation profile No results for input(s): INR, PROTIME in the last 168 hours.  CBC: Recent Labs  Lab 01/07/21 0449 01/08/21 0442 01/09/21 0511 01/10/21 0518 01/11/21 0600  WBC 28.4* 32.8* 34.0* 29.7* 28.4*  NEUTROABS 24.5* 28.1* 28.6* 24.7* 23.2*  HGB 8.2* 8.0* 8.2* 8.4* 8.2*  HCT 26.3* 25.9* 26.0* 26.9* 26.7*  MCV 114.8* 113.1* 115.0* 115.5* 114.1*  PLT 186 250 282 305 289   Cardiac Enzymes: No results for input(s): CKTOTAL, CKMB, CKMBINDEX, TROPONINI in the last 168 hours. BNP (last 3 results) No results for input(s):  PROBNP in the last 8760 hours. CBG: Recent Labs  Lab 01/11/21 0743 01/11/21 1131 01/11/21 1647 01/11/21 2101 01/12/21 0755  GLUCAP 212* 256* 353* 358* 198*   D-Dimer: No results for input(s): DDIMER in the last 72 hours. Hgb A1c: No results for input(s): HGBA1C in the last 72 hours. Lipid Profile: No results for input(s): CHOL, HDL, LDLCALC, TRIG, CHOLHDL, LDLDIRECT in the last 72 hours. Thyroid  function studies: No results for input(s): TSH, T4TOTAL, T3FREE, THYROIDAB in the last 72 hours.  Invalid input(s): FREET3 Anemia work up: No results for input(s): VITAMINB12, FOLATE, FERRITIN, TIBC, IRON, RETICCTPCT in the last 72 hours. Sepsis Labs: Recent Labs  Lab 01/06/21 0631 01/07/21 0449 01/08/21 0442 01/09/21 0511 01/10/21 0518 01/11/21 0600  PROCALCITON 2.54  --   --   --   --   --   WBC 26.8*   < > 32.8* 34.0* 29.7* 28.4*   < > = values in this interval not displayed.    Microbiology Recent Results (from the past 240 hour(s))  Cath Tip Culture     Status: None   Collection Time: 01/04/21  1:59 PM   Specimen: Catheter Tip; Other  Result Value Ref Range Status   Specimen Description   Final    CATH TIP Performed at [facility], [address] [place], [place] [address]    Special Requests   Final    NONE Performed at [facility], [address] [place], [place] [address]    Culture   Final    NO GROWTH 2 DAYS Performed at [facility], [address], [place], [place] [address]    Report Status 01/07/2021 FINAL  Final  Culture, blood (single) w Reflex to ID Panel     Status: None   Collection Time: 01/04/21  2:00 PM   Specimen: BLOOD  Result Value Ref Range Status   Specimen Description BLOOD ALIN  Final   Special Requests   Final    BOTTLES DRAWN AEROBIC AND ANAEROBIC Blood Culture results may not be optimal due to an excessive volume of blood received in culture bottles   Culture   Final    NO GROWTH 5 DAYS Performed at [facility], [address], [place], [place] [address]    Report Status 01/09/2021 FINAL  Final  Culture, blood (single) w Reflex to ID Panel     Status: None   Collection Time: 01/04/21  2:35 PM   Specimen: BLOOD  Result Value Ref Range Status   Specimen Description BLOOD LEFT ANTECUBITAL  Final   Special Requests   Final    BOTTLES DRAWN AEROBIC AND ANAEROBIC Blood Culture adequate volume    Culture   Final    NO GROWTH 5 DAYS Performed at [facility], [address], [place], [place] [address]    Report Status 01/09/2021 FINAL  Final  Body fluid culture w Gram Stain     Status: None   Collection Time: 01/06/21 10:55 AM   Specimen: PATH Cytology Peritoneal fluid  Result Value Ref Range Status   Specimen Description   Final    PERITONEAL Performed at [facility], [address] [place], [place] [address]    Special Requests   Final    NONE Performed at [facility], [address] [place], [place] [address]    Gram Stain   Final    FEW WBC PRESENT, PREDOMINANTLY MONONUCLEAR NO ORGANISMS SEEN    Culture  Final    NO GROWTH 3 DAYS Performed at [facility], [address], [place], [place] [address]    Report Status 01/09/2021 FINAL  Final  Urine Culture     Status: Abnormal   Collection Time: 01/09/21  5:30 AM   Specimen: Urine, Clean Catch  Result Value Ref Range Status   Specimen Description   Final    URINE, CLEAN CATCH Performed at [facility], [address] [place], [place] [address]    Special Requests   Final    NONE Performed at [facility], [address] [place], [place] [address]    Culture (A)  Final    30,000 COLONIES/mL MULTIPLE SPECIES PRESENT, SUGGEST RECOLLECTION   Report Status 01/10/2021 FINAL  Final    Procedures and diagnostic studies:  DG Abd 1 View  Result Date: 01/10/2021 CLINICAL DATA:  Abdomen pain fluid retention EXAM: ABDOMEN - 1 VIEW COMPARISON:  MRI 12/23/2020 FINDINGS: Limited by habitus. Overall nonobstructed gas pattern. No radiopaque calculi. IMPRESSION: Nonobstructed gas pattern Electronically Signed   By: Jasmine Pang M.D.   On: 01/10/2021 15:47               LOS: 25 days   Wilhemina Grall  [facility]   Pager on [email or web address]. If 7PM-7AM, please contact night-coverage at [email or web address]     01/12/2021, 12:12 PM

## 2021-01-12 NOTE — Progress Notes (Signed)
Date of Admission:  12/17/2020      ID: [name] is a 45 y.o. female Principal Problem:   Alcohol use disorder, moderate, dependence ([name]) Active Problems:   Alcohol withdrawal ([name])   Bipolar 2 disorder ([name])   Hypokalemia   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[name])   Acute hyponatremia   Hepatic steatosis   Polysubstance abuse ([name])   Hyperglycemia due to type 2 diabetes mellitus ([name])   Hyperbilirubinemia   Lactic acidosis   Alcoholic liver disease ([name])   Sepsis ([name])   Acute hepatic encephalopathy   Pressure injury of skin   Aspiration pneumonia of both lower lobes due to gastric secretions ([name])    Subjective: Doing better Had paracentesis and 2 L fluid removed Had a good lunch  Medications:   budesonide  9 mg Oral Daily   feeding supplement  237 mL Oral TID BM   FLUoxetine  80 mg Oral Daily   fluticasone  1 spray Each Nare Daily   folic acid  1 mg Oral Daily   furosemide  40 mg Oral Daily   insulin aspart  0-20 Units Subcutaneous TID WC   insulin aspart  0-5 Units Subcutaneous QHS   insulin aspart  6 Units Subcutaneous TID WC   insulin glargine-yfgn  18 Units Subcutaneous BID   multivitamin with minerals  1 tablet Oral Daily   OLANZapine  5 mg Oral QHS   pantoprazole  40 mg Oral BID   pregabalin  75 mg Oral TID   rifaximin  550 mg Oral BID   spironolactone  100 mg Oral Daily   thiamine  100 mg Oral Daily   Vitamin D (Ergocalciferol)  50,000 Units Oral Q7 days    Objective: Vital signs in last 24 hours: Temp:  [98 F (36.7 C)-98.5 F (36.9 C)] 98.3 F (36.8 C) (10/27 0339) Pulse Rate:  [103-120] 116 (10/27 1220) Resp:  [18-20] 20 (10/27 1145) BP: (118-137)/(65-82) 118/65 (10/27 1220) SpO2:  [93 %-98 %] 96 % (10/27 1220) Weight:  [107.1 kg] 107.1 kg (10/27 0353)  PHYSICAL EXAM:  General:  Alert, cooperative, no distress,  Eyes: icteric sclerae. Pupils are equal ENT Nares normal. No drainage or sinus tenderness. Lips, mucosa, and  tongue normal. No Thrush Neck: Supple, symmetrical, no adenopathy, thyroid: non tender no carotid bruit and no JVD. Back: No CVA tenderness. Lungs: b/l air entry- Decreased in bases Hs- irregular Abdomen: Soft, non-tender,not distended. Bowel sounds normal. No masses Extremities: bruising Lymph: Cervical, supraclavicular normal. Neurologic: Grossly non-focal  Lab Results Recent Labs    01/10/21 0518 01/11/21 0600 01/12/21 0800  WBC 29.7* 28.4*  --   HGB 8.4* 8.2*  --   HCT 26.9* 26.7*  --   NA 133* 133* 133*  K 3.8 3.9 4.4  CL 91* 92* 94*  CO2 35* 35* 32  BUN 20 18 17   CREATININE 0.55 0.69 0.62   Liver Panel Recent Labs    01/10/21 0518 01/11/21 0600  PROT 6.4* 6.5  ALBUMIN <1.5* <1.5*  AST 139* 128*  ALT 29 28  ALKPHOS 239* 232*  BILITOT 9.3* 8.3*    Microbiology: 01/09/2021 urine culture 30,000 colonies of mixed species 01/06/2021 abdominal fluid culture no growth 01/04/2021 blood cultures no growth  10-22 blood culture no growth 01/04/2021 catheter tip no growth Studies/Results: 01/06/2021 Paracentesis  Result Date: 01/12/2021 INDICATION: Ascites EXAM: ULTRASOUND GUIDED  PARACENTESIS MEDICATIONS: None. COMPLICATIONS: None immediate. PROCEDURE: Informed written consent was obtained from the patient after a discussion  of the risks, benefits and alternatives to treatment. A timeout was performed prior to the initiation of the procedure. Initial ultrasound scanning demonstrates a large amount of ascites within the right lower abdominal quadrant. The right lower abdomen was prepped and draped in the usual sterile fashion. 1% lidocaine was used for local anesthesia. Following this, a 19 gauge Yueh catheter was introduced. An ultrasound image was saved for documentation purposes. The paracentesis was performed. The catheter was removed and a dressing was applied. The patient tolerated the procedure well without immediate post procedural complication. FINDINGS: A total of  approximately 2 L of clear straw-colored fluid was removed. IMPRESSION: Successful ultrasound-guided paracentesis yielding 2 liters of clear straw-colored peritoneal fluid. Electronically Signed   By: Olive Bass M.D.   On: 01/12/2021 13:56     Assessment/Plan:  Alcoholic hepatitis Marked hepatomegaly with steatohepatitis and ascites.  No secondary bacterial peritonitis. Paracentesis done today.  Hepatic encephalopathy has resolved  Leukocytosis: Stable No evidence clinically of UTI or pneumonia on SBP.  Currently on cefepime which will be discontinued.  Observe off antibiotics.  Diarrhea secondary to lactulose has resolved since stopping The medication.

## 2021-01-12 NOTE — Plan of Care (Signed)
  Problem: Education: Goal: Knowledge of General Education information will improve Description: Including pain rating scale, medication(s)/side effects and non-pharmacologic comfort measures Outcome: Progressing   Problem: Health Behavior/Discharge Planning: Goal: Ability to manage health-related needs will improve Outcome: Progressing   Problem: Clinical Measurements: Goal: Ability to maintain clinical measurements within normal limits will improve Outcome: Progressing Goal: Will remain free from infection Outcome: Progressing Goal: Diagnostic test results will improve Outcome: Progressing Goal: Respiratory complications will improve Outcome: Progressing Goal: Cardiovascular complication will be avoided Outcome: Progressing   Problem: Activity: Goal: Risk for activity intolerance will decrease Outcome: Progressing   Problem: Nutrition: Goal: Adequate nutrition will be maintained Outcome: Progressing   Problem: Coping: Goal: Level of anxiety will decrease Outcome: Progressing   Problem: Elimination: Goal: Will not experience complications related to bowel motility Outcome: Progressing Goal: Will not experience complications related to urinary retention Outcome: Progressing   Problem: Pain Managment: Goal: General experience of comfort will improve Outcome: Progressing   Problem: Safety: Goal: Ability to remain free from injury will improve Outcome: Progressing   Problem: Skin Integrity: Goal: Risk for impaired skin integrity will decrease Outcome: Progressing   Problem: Education: Goal: Knowledge of disease or condition will improve Outcome: Progressing Goal: Understanding of discharge needs will improve Outcome: Progressing   Problem: Health Behavior/Discharge Planning: Goal: Ability to identify changes in lifestyle to reduce recurrence of condition will improve Outcome: Progressing Goal: Identification of resources available to assist in meeting health  care needs will improve Outcome: Progressing   Problem: Physical Regulation: Goal: Complications related to the disease process, condition or treatment will be avoided or minimized Outcome: Progressing   Problem: Safety: Goal: Ability to remain free from injury will improve Outcome: Progressing   Problem: Activity: Goal: Ability to tolerate increased activity will improve Outcome: Progressing   Problem: Clinical Measurements: Goal: Ability to maintain a body temperature in the normal range will improve Outcome: Progressing   Problem: Respiratory: Goal: Ability to maintain adequate ventilation will improve Outcome: Progressing Goal: Ability to maintain a clear airway will improve Outcome: Progressing

## 2021-01-12 NOTE — Progress Notes (Signed)
Inpatient Diabetes Program Recommendations  AACE/ADA: New Consensus Statement on Inpatient Glycemic Control (2015)  Target Ranges:  Prepandial:   less than 140 mg/dL      Peak postprandial:   less than 180 mg/dL (1-2 hours)      Critically ill patients:  140 - 180 mg/dL  Results for [name], [name] (MRN [id number]) as of 01/12/2021 14:01  Ref. Range 01/11/2021 07:43 01/11/2021 11:31 01/11/2021 16:47 01/11/2021 21:01  Glucose-Capillary Latest Ref Range: 70 - 99 mg/dL 528 (H)  13 units Novolog @0950  256 (H)  17 units Novolog @1343  353 (H)  26 units Novolog @1822  358 (H)  5 units Novolog   26 units Semglee  Results for [name], [name] (MRN ) as of 01/12/2021 14:01  Ref. Range 01/12/2021 07:55 01/12/2021 13:18  Glucose-Capillary Latest Ref Range: 70 - 99 mg/dL 01/14/2021 (H)  10 units Novolog @0945   18 units Semglee @0951  298 (H)  17 units Novolog     Home DM Meds: Janumet XR 100/1000 mg Daily                             Metformin 500 mg BID                             Ozempic Qweek   Current Orders: Semglee 18 units BID  Novolog 0-20 units TID ac/hs  Novolog 6 units TID with meals   Semglee increased this AM to 18 BID (got total of 26 units yest--will get total of 36 units today)   MD- Note CBG at 1pm today elevated to 298 after getting 10 units Novolog at 10am today (6 units meal coverage + 4 units SSI)  Please consider futher increasing the Novolog Meal Coverage to 10 units TID with meals    --Will follow patient during hospitalization--  01/14/2021 RN, MSN, CDE Diabetes Coordinator Inpatient Glycemic Control Team Team Pager: [phone] (8a-5p)

## 2021-01-13 DIAGNOSIS — K7011 Alcoholic hepatitis with ascites: Secondary | ICD-10-CM | POA: Diagnosis not present

## 2021-01-13 DIAGNOSIS — K7682 Hepatic encephalopathy: Secondary | ICD-10-CM | POA: Diagnosis not present

## 2021-01-13 DIAGNOSIS — K709 Alcoholic liver disease, unspecified: Secondary | ICD-10-CM | POA: Diagnosis not present

## 2021-01-13 DIAGNOSIS — D72829 Elevated white blood cell count, unspecified: Secondary | ICD-10-CM | POA: Diagnosis not present

## 2021-01-13 DIAGNOSIS — F1020 Alcohol dependence, uncomplicated: Secondary | ICD-10-CM | POA: Diagnosis not present

## 2021-01-13 LAB — GLUCOSE, CAPILLARY
Glucose-Capillary: 185 mg/dL — ABNORMAL HIGH (ref 70–99)
Glucose-Capillary: 209 mg/dL — ABNORMAL HIGH (ref 70–99)
Glucose-Capillary: 238 mg/dL — ABNORMAL HIGH (ref 70–99)
Glucose-Capillary: 257 mg/dL — ABNORMAL HIGH (ref 70–99)

## 2021-01-13 LAB — CBC WITH DIFFERENTIAL/PLATELET
Abs Immature Granulocytes: 0.67 10*3/uL — ABNORMAL HIGH (ref 0.00–0.07)
Basophils Absolute: 0.1 10*3/uL (ref 0.0–0.1)
Basophils Relative: 0 %
Eosinophils Absolute: 0.5 10*3/uL (ref 0.0–0.5)
Eosinophils Relative: 2 %
HCT: 26.9 % — ABNORMAL LOW (ref 36.0–46.0)
Hemoglobin: 8.5 g/dL — ABNORMAL LOW (ref 12.0–15.0)
Immature Granulocytes: 2 %
Lymphocytes Relative: 9 %
Lymphs Abs: 2.6 10*3/uL (ref 0.7–4.0)
MCH: 35.4 pg — ABNORMAL HIGH (ref 26.0–34.0)
MCHC: 31.6 g/dL (ref 30.0–36.0)
MCV: 112.1 fL — ABNORMAL HIGH (ref 80.0–100.0)
Monocytes Absolute: 1.1 10*3/uL — ABNORMAL HIGH (ref 0.1–1.0)
Monocytes Relative: 4 %
Neutro Abs: 23.4 10*3/uL — ABNORMAL HIGH (ref 1.7–7.7)
Neutrophils Relative %: 83 %
Platelets: 273 10*3/uL (ref 150–400)
RBC: 2.4 MIL/uL — ABNORMAL LOW (ref 3.87–5.11)
RDW: 17.9 % — ABNORMAL HIGH (ref 11.5–15.5)
Smear Review: NORMAL
WBC: 28.4 10*3/uL — ABNORMAL HIGH (ref 4.0–10.5)
nRBC: 0 % (ref 0.0–0.2)

## 2021-01-13 MED ORDER — INSULIN GLARGINE-YFGN 100 UNIT/ML ~~LOC~~ SOLN
22.0000 [IU] | Freq: Two times a day (BID) | SUBCUTANEOUS | Status: DC
  Administered 2021-01-13: 22 [IU] via SUBCUTANEOUS
  Filled 2021-01-13 (×3): qty 0.22

## 2021-01-13 NOTE — Progress Notes (Signed)
ID Pt working with PT Poor upper body strenght Also not able to stand up or move in bed while sitting PT helped her to sit in chair for 30 min  Awake and alert Icteric sclera No distress On nasal cannula  Patient Vitals for the past 24 hrs:  BP Temp Temp src Pulse Resp SpO2  01/13/21 0728 116/75 98.8 F (37.1 C) Oral (!) 107 20 96 %  01/13/21 0532 106/62 98.4 F (36.9 C) Oral -- 18 96 %  01/12/21 1944 128/66 98.5 F (36.9 C) Oral (!) 110 18 94 %  01/12/21 1548 124/74 99.1 F (37.3 C) Oral (!) 108 18 96 %    Chest b/l air entry Decreased bases [id number] Abd soft -distended Cns grossly non focal  Labs CBC Latest Ref Rng & Units 01/13/2021 01/11/2021 01/10/2021  WBC 4.0 - 10.5 K/uL 28.4(H) 28.4(H) 29.7(H)  Hemoglobin 12.0 - 15.0 g/dL 2.9(B) 2.8(U) 1.3(K)  Hematocrit 36.0 - 46.0 % 26.9(L) 26.7(L) 26.9(L)  Platelets 150 - 400 K/uL 273 289 305     CMP Latest Ref Rng & Units 01/12/2021 01/11/2021 01/10/2021  Glucose 70 - 99 mg/dL 440(N) 027(O) 536(U)  BUN 6 - 20 mg/dL 17 18 20   Creatinine 0.44 - 1.00 mg/dL 4.40 3.47  Sodium 135 - 145 mmol/L 133(L) 133(L) 133(L)  Potassium 3.5 - 5.1 mmol/L 4.4 3.9 3.8  Chloride 98 - 111 mmol/L 94(L) 92(L) 91(L)  CO2 22 - 32 mmol/L 32 35(H) 35(H)  Calcium 8.9 - 10.3 mg/dL 4.25) 8.1(L) 8.4(L)  Total Protein 6.5 - 8.1 g/dL - 6.5 9.5(G)  Total Bilirubin 0.3 - 1.2 mg/dL - 8.3(H) 9.3(H)  Alkaline Phos 38 - 126 U/L - 232(H) 239(H)  AST 15 - 41 U/L - 128(H) 139(H)  ALT 0 - 44 U/L - 28 29      Impression/recommendation  Alcoholic hepatitis with ascites S/p paracentesis X 3 No SPB  Leucocytosis - plateaued- stable- no sepsis was treated like aspiration pneumonia, UTI ( by urine examination- she was clinically asymptomatic) , with IV antibiotics but no change in leucocytosis- as currently no evidence of infection Dced all antibiotics and is observing No cdiff clinically and also diarrhea resolved once lactulose was disocntinued  Acute  hepatic encephalopathy- resolved  DM- poorly controlled  Fascioscapulo humeral muscular dystrophy  Pt on budesonide oral capsule 9mg  /day- ??  ID will follow her peripherally this weekend -call if needed

## 2021-01-13 NOTE — TOC Progression Note (Signed)
Transition of Care [facility]) - Progression Note    Patient Details  Name: [name] MRN: [id number] Date of Birth: 1976/03/07  Transition of Care [facility]) CM/SW Contact  Margarito Liner, LCSW Phone Number: 01/13/2021, 10:39 AM  Clinical Narrative:  Patient still without bed offers. Updated patient and she put her mom on speakerphone. Mom confirmed she has nowhere to go and patient cannot come home with her. She stated they have tried that several times and it never works out. Parents live in [place] and are disabled. Patient does not have the income to pay for a hotel. Asked [facility] to review. Sent updated therapy notes to facilities that had not responded to referral yet.   Expected Discharge Plan: Skilled Nursing Facility Barriers to Discharge: Continued Medical Work up  Expected Discharge Plan and Services Expected Discharge Plan: Skilled Nursing Facility     Post Acute Care Choice: Skilled Nursing Facility Living arrangements for the past 2 months: Single Family Home                                       Social Determinants of Health (SDOH) Interventions    Readmission Risk Interventions No flowsheet data found.

## 2021-01-13 NOTE — Progress Notes (Signed)
Progress Note    [name]  DUK:[id number] DOB: 1975-06-29  DOA: 12/17/2020 PCP: Eunice Blase, PA-C      Brief Narrative:    Medical records reviewed and are as summarized below:  [name] is a 45 y.o. female with medical history significant for bipolar disorder, type 2 diabetes mellitus, alcohol use disorder, polysubstance use disorder, FSHD muscular dystrophy, history of falls, who was brought to the hospital because of a fall and inability to get up on her own.  She was found to have sepsis secondary to aspiration pneumonia, severe hyponatremia with sodium level of 113, hypokalemia with potassium of 2.9, hyperbilirubinemia with bilirubin of 11.9 and elevated ammonia of 52.  She was treated with empiric IV antibiotics and IV fluids.  Magnesium, phosphorus and potassium were repleted.  She was placed on 2 L/min oxygen for acute hypoxemic respiratory failure.  She was given lactulose and rifaximin for hepatic encephalopathy.  She underwent paracentesis for ascites.  After completing antibiotic therapy (Unasyn and Zyvox), she continued to have persistent leukocytosis and she was found to have UTI.  ID was consulted to assist with management.    Assessment/Plan:   Principal Problem:   Alcohol use disorder, moderate, dependence ([name]) Active Problems:   Alcohol withdrawal ([name])   Bipolar 2 disorder ([name])   Hypokalemia   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[name])   Acute hyponatremia   Hepatic steatosis   Polysubstance abuse ([name])   Hyperglycemia due to type 2 diabetes mellitus ([name])   Hyperbilirubinemia   Lactic acidosis   Alcoholic liver disease ([name])   Sepsis ([name])   Acute hepatic encephalopathy   Pressure injury of skin   Aspiration pneumonia of both lower lobes due to gastric secretions ([name])   Nutrition Problem: Inadequate oral intake Etiology: acute illness  Signs/Symptoms: per patient/family report   Body mass index is 36.98 kg/m.  (Morbid  obesity)  Sepsis secondary to aspiration pneumonia, acute UTI, persistent leukocytosis: Completed IV cefepime on 01/11/2021.  Follow-up with ID for further recommendations.  Alcoholic liver cirrhosis with ascites, liver failure, acute hepatic encephalopathy, hypoalbuminemia, associated severe pulmonary hypertension: S/p paracentesis x3 on this admission.  Most recent 1 was on 01/12/2021.  Continue Lasix, Aldactone and rifaximin.    Alcohol use disorder, polysubstance use disorder: Continue thiamine and multivitamins.  Bipolar disorder: Continue psychotropics.  Follow-up with psychiatrist.  Type II DM with hyperglycemia: Increase insulin glargine from 18 units twice daily to 22 units twice daily.  Continue NovoLog and monitor glucose levels closely.  Hyponatremia, hypokalemia, hypomagnesemia and hypokalemia phosphatemia: Improved.  Continue to monitor levels.  Anemia of chronic disease: H&H is stable.  Diet Order             Diet Carb Modified Fluid consistency: Thin; Room service appropriate? Yes  Diet effective now                      Consultants: Infectious disease, psychiatry, gastroenterology, nephrology  Procedures: Paracentesis on 12/30/2020 , 01/06/2021, 01/12/2021 Right-sided tunneled central catheter on 12/30/2020    Medications:    budesonide  9 mg Oral Daily   feeding supplement  237 mL Oral TID BM   FLUoxetine  80 mg Oral Daily   fluticasone  1 spray Each Nare Daily   folic acid  1 mg Oral Daily   furosemide  40 mg Oral Daily   insulin aspart  0-20 Units Subcutaneous TID WC   insulin aspart  0-5 Units  Subcutaneous QHS   insulin aspart  6 Units Subcutaneous TID WC   insulin glargine-yfgn  22 Units Subcutaneous BID   multivitamin with minerals  1 tablet Oral Daily   OLANZapine  5 mg Oral QHS   pantoprazole  40 mg Oral BID   pregabalin  75 mg Oral TID   rifaximin  550 mg Oral BID   spironolactone  100 mg Oral Daily   thiamine  100 mg Oral Daily    Vitamin D (Ergocalciferol)  50,000 Units Oral Q7 days   Continuous Infusions:  sodium chloride Stopped (12/31/20 0118)     Anti-infectives (From admission, onward)    Start     Dose/Rate Route Frequency Ordered Stop   01/10/21 1400  rifaximin (XIFAXAN) tablet 550 mg        550 mg Oral 2 times daily 01/10/21 1302     01/09/21 1800  ceFEPIme (MAXIPIME) 2 g in sodium chloride 0.9 % 100 mL IVPB  Status:  Discontinued        2 g 200 mL/hr over 30 Minutes Intravenous Every 12 hours 01/09/21 1533 01/12/21 0855   01/07/21 1800  vancomycin (VANCOCIN) capsule 125 mg  Status:  Discontinued        125 mg Oral 4 times daily 01/07/21 1511 01/09/21 1924   01/06/21 1400  vancomycin (VANCOREADY) IVPB 2000 mg/400 mL  Status:  Discontinued        2,000 mg 200 mL/hr over 120 Minutes Intravenous Every 24 hours 01/05/21 1400 01/06/21 1114   01/05/21 1415  vancomycin (VANCOCIN) 2,000 mg in sodium chloride 0.9 % 500 mL IVPB  Status:  Discontinued        2,000 mg 250 mL/hr over 120 Minutes Intravenous  Once 01/05/21 1315 01/05/21 1317   01/05/21 1415  vancomycin (VANCOREADY) IVPB 2000 mg/400 mL        2,000 mg 200 mL/hr over 120 Minutes Intravenous  Once 01/05/21 1318 01/05/21 1803   01/04/21 1415  Ampicillin-Sulbactam (UNASYN) 3 g in sodium chloride 0.9 % 100 mL IVPB  Status:  Discontinued        3 g 200 mL/hr over 30 Minutes Intravenous Every 6 hours 01/04/21 1317 01/07/21 1031   12/22/20 2200  linezolid (ZYVOX) tablet 600 mg  Status:  Discontinued        600 mg Oral Every 12 hours 12/22/20 1143 12/27/20 1213   12/20/20 2200  vancomycin (VANCOCIN) IVPB 1000 mg/200 mL premix  Status:  Discontinued        1,000 mg 200 mL/hr over 60 Minutes Intravenous Every 12 hours 12/20/20 0839 12/22/20 1143   12/20/20 0930  vancomycin (VANCOCIN) 2,000 mg in sodium chloride 0.9 % 500 mL IVPB  Status:  Discontinued        2,000 mg 250 mL/hr over 120 Minutes Intravenous  Once 12/20/20 0839 12/20/20 0907   12/20/20 0930   vancomycin (VANCOCIN) 2,000 mg in sodium chloride 0.9 % 500 mL IVPB        2,000 mg 250 mL/hr over 120 Minutes Intravenous  Once 12/20/20 0907 12/20/20 1223   12/19/20 1630  rifaximin (XIFAXAN) tablet 550 mg  Status:  Discontinued        550 mg Oral 2 times daily 12/19/20 1533 01/07/21 1511   12/18/20 1200  Ampicillin-Sulbactam (UNASYN) 3 g in sodium chloride 0.9 % 100 mL IVPB  Status:  Discontinued        3 g 200 mL/hr over 30 Minutes Intravenous Every 6 hours 12/18/20 1038 12/20/20  6213   12/18/20 0045  Ampicillin-Sulbactam (UNASYN) 3 g in sodium chloride 0.9 % 100 mL IVPB        3 g 200 mL/hr over 30 Minutes Intravenous  Once 12/18/20 0033 12/18/20 0303              Family Communication/Anticipated D/C date and plan/Code Status   DVT prophylaxis:      Code Status: Full Code  Family Communication: None Disposition Plan: Awaiting placement to SNF    Status is: Inpatient  Remains inpatient appropriate because: Awaiting placement to rehab           Subjective:   Interval events noted.  Abdomen is still distended despite having paracentesis yesterday.  No shortness of breath or chest pain  Objective:    Vitals:   01/12/21 1548 01/12/21 1944 01/13/21 0532 01/13/21 0728  BP: 124/74 128/66 106/62 116/75  Pulse: (!) 108 (!) 110  (!) 107  Resp: Temp: 99.1 F (37.3 C) 98.5 F (36.9 C) 98.4 F (36.9 C) 98.8 F (37.1 C)  TempSrc: Oral Oral Oral Oral  SpO2: 96% 94% 96% 96%  Weight:      Height:       No data found.   Intake/Output Summary (Last 24 hours) at 01/13/2021 1326 Last data filed at 01/13/2021 0700 Gross per 24 hour  Intake --  Output 2350 ml  Net -2350 ml   Filed Weights   01/10/21 0500 01/11/21 0500 01/12/21 0353  Weight: 108.5 kg 108.4 kg 107.1 kg    Exam:  GEN: NAD SKIN: Jaundice EYES: Icteric ENT: MMM CV: RRR PULM: CTA B ABD: soft, distended, NT, +BS CNS: AAO x 3, non focal EXT: No edema or  tenderness         Data Reviewed:   I have personally reviewed following labs and imaging studies:  Labs: Labs show the following:   Basic Metabolic Panel: Recent Labs  Lab 01/08/21 0442 01/09/21 0511 01/10/21 0518 01/11/21 0600 01/12/21 0800  NA 133* 135 133* 133* 133*  K 4.0 3.6 3.8 3.9 4.4  CL 88* 87* 91* 92* 94*  CO2 35* 34* 35* 35* 32  GLUCOSE 225* 161* 264* 247* 325*  BUN CREATININE 0.53 0.55 0.55 0.69 0.62  CALCIUM 7.8* 8.0* 8.4* 8.1* 8.6*  MG 1.6* 1.8 1.6* 2.0 1.7  PHOS 3.0  --   --  3.5  --    GFR Estimated Creatinine Clearance: 113.1 mL/min (by C-G formula based on SCr of 0.62 mg/dL). Liver Function Tests: Recent Labs  Lab 01/08/21 0442 01/09/21 0511 01/10/21 0518 01/11/21 0600  AST 154* 143* 139* 128*  ALT ALKPHOS 201* 219* 239* 232*  BILITOT 9.8* 9.6* 9.3* 8.3*  PROT 6.5 6.5 6.4* 6.5  ALBUMIN 1.5* <1.5* <1.5* <1.5*   No results for input(s): LIPASE, AMYLASE in the last 168 hours. Recent Labs  Lab 01/10/21 1000  AMMONIA 43*   Coagulation profile No results for input(s): INR, PROTIME in the last 168 hours.  CBC: Recent Labs  Lab 01/08/21 0442 01/09/21 0511 01/10/21 0518 01/11/21 0600 01/13/21 0545  WBC 32.8* 34.0* 29.7* 28.4* 28.4*  NEUTROABS 28.1* 28.6* 24.7* 23.2* 23.4*  HGB 8.0* 8.2* 8.4* 8.2* 8.5*  HCT 25.9* 26.0* 26.9* 26.7* 26.9*  MCV 113.1* 115.0* 115.5* 114.1* 112.1*  PLT 250 282 305 289 273   Cardiac Enzymes: No results for input(s): CKTOTAL, CKMB, CKMBINDEX, TROPONINI in the last  168 hours. BNP (last 3 results) No results for input(s): PROBNP in the last 8760 hours. CBG: Recent Labs  Lab 01/12/21 1318 01/12/21 1715 01/12/21 2119 01/13/21 0724 01/13/21 1146  GLUCAP 298* 271* 167* 185* 209*   D-Dimer: No results for input(s): DDIMER in the last 72 hours. Hgb A1c: No results for input(s): HGBA1C in the last 72 hours. Lipid Profile: No results for input(s): CHOL, HDL, LDLCALC,  TRIG, CHOLHDL, LDLDIRECT in the last 72 hours. Thyroid function studies: No results for input(s): TSH, T4TOTAL, T3FREE, THYROIDAB in the last 72 hours.  Invalid input(s): FREET3 Anemia work up: No results for input(s): VITAMINB12, FOLATE, FERRITIN, TIBC, IRON, RETICCTPCT in the last 72 hours. Sepsis Labs: Recent Labs  Lab 01/09/21 0511 01/10/21 0518 01/11/21 0600 01/13/21 0545  WBC 34.0* 29.7* 28.4* 28.4*    Microbiology Recent Results (from the past 240 hour(s))  Cath Tip Culture     Status: None   Collection Time: 01/04/21  1:59 PM   Specimen: Catheter Tip; Other  Result Value Ref Range Status   Specimen Description   Final    CATH TIP Performed at [facility], [address] [place], [place] [address]    Special Requests   Final    NONE Performed at [facility], [address], [place], [place] [address]    Culture   Final    NO GROWTH 2 DAYS Performed at [facility], [address], [place], [place] [address]    Report Status 01/07/2021 FINAL  Final  Culture, blood (single) w Reflex to ID Panel     Status: None   Collection Time: 01/04/21  2:00 PM   Specimen: BLOOD  Result Value Ref Range Status   Specimen Description BLOOD ALIN  Final   Special Requests   Final    BOTTLES DRAWN AEROBIC AND ANAEROBIC Blood Culture results may not be optimal due to an excessive volume of blood received in culture bottles   Culture   Final    NO GROWTH 5 DAYS Performed at [facility], [address], [place], [place] [address]    Report Status 01/09/2021 FINAL  Final  Culture, blood (single) w Reflex to ID Panel     Status: None   Collection Time: 01/04/21  2:35 PM   Specimen: BLOOD  Result Value Ref Range Status   Specimen Description BLOOD LEFT ANTECUBITAL  Final   Special Requests   Final    BOTTLES DRAWN AEROBIC AND ANAEROBIC Blood Culture adequate volume   Culture   Final    NO GROWTH 5 DAYS Performed at [facility],  [address] [place], [place] [address]    Report Status 01/09/2021 FINAL  Final  Body fluid culture w Gram Stain     Status: None   Collection Time: 01/06/21 10:55 AM   Specimen: PATH Cytology Peritoneal fluid  Result Value Ref Range Status   Specimen Description   Final    PERITONEAL Performed at [facility], [address], [place], [place] [address]    Special Requests   Final    NONE Performed at [facility], [address] [place], [place] [address]    Gram Stain   Final    FEW WBC PRESENT, PREDOMINANTLY MONONUCLEAR NO ORGANISMS SEEN    Culture   Final    NO GROWTH 3 DAYS Performed at [facility], [address] [place], [place] [address]    Report Status 01/09/2021  FINAL  Final  Urine Culture     Status: Abnormal   Collection Time: 01/09/21  5:30 AM   Specimen: Urine, Clean Catch  Result Value Ref Range Status   Specimen Description   Final    URINE, CLEAN CATCH Performed at [facility], [address], [place], [place] [address]    Special Requests   Final    NONE Performed at [facility], [address] [place], [place] [address]    Culture (A)  Final    30,000 COLONIES/mL MULTIPLE SPECIES PRESENT, SUGGEST RECOLLECTION   Report Status 01/10/2021 FINAL  Final    Procedures and diagnostic studies:  US Paracentesis  Result Date: 01/12/2021 INDICATION: Ascites EXAM: ULTRASOUND GUIDED  PARACENTESIS MEDICATIONS: None. COMPLICATIONS: None immediate. PROCEDURE: Informed written consent was obtained from the patient after a discussion of the risks, benefits and alternatives to treatment. A timeout was performed prior to the initiation of the procedure. Initial ultrasound scanning demonstrates a large amount of ascites within the right lower abdominal quadrant. The right lower abdomen was prepped and draped in the usual sterile fashion. 1% lidocaine was used for local anesthesia. Following this, a 19 gauge Yueh  catheter was introduced. An ultrasound image was saved for documentation purposes. The paracentesis was performed. The catheter was removed and a dressing was applied. The patient tolerated the procedure well without immediate post procedural complication. FINDINGS: A total of approximately 2 L of clear straw-colored fluid was removed. IMPRESSION: Successful ultrasound-guided paracentesis yielding 2 liters of clear straw-colored peritoneal fluid. Electronically Signed   By: Olive Bass M.D.   On: 01/12/2021 13:56               LOS: 26 days   Future [place]  [facility]   Pager on [email or web address]. If 7PM-7AM, please contact night-coverage at [email or web address]     01/13/2021, 1:26 PM

## 2021-01-13 NOTE — Progress Notes (Signed)
Occupational Therapy Treatment Patient Details Name: SAVANHA ISLAND MRN: [id number] DOB: 1975/06/29 Today's Date: 01/13/2021   History of present illness 45 y.o. female with medical history significant for Bipolar disorder, diabetes, alcohol use disorder and polysubstance abuse, FSH muscular dystrophy, with history of frequent falls, last seen by neurology in January 2020, who was brought in by EMS after she fell at home 2 days prior and was unable to get up.   OT comments  Pt seen for OT session focused on incorporating learned ECS into daily ADL tasks. Pt up in chair and agreeable. Pt set up with her personal products and wash cloth. With back support and intermittent UE support on arm rest, pt was able to wash face and apply moisturizers to face one task at a time with [place] doffed briefly for each task, reapplied and Pt instructed in PLB to support breath recovery. SpO2 90-93% on 2L (down from 2.5L) and dropped to 88-89% on RA ~61min, HR up to 120. With PLB +60min and [place] reapplied, SpO2 improved back to 91-92% and HR back down to 105-112. RN notified. Pt continues to benefit from skilled OT services. Continue to strongly recommend STR at SNF upon discharge given pt is significantly far from baseline independence and unsafe to discharge back home at this time.    Recommendations for follow up therapy are one component of a multi-disciplinary discharge planning process, led by the attending physician.  Recommendations may be updated based on patient status, additional functional criteria and insurance authorization.    Follow Up Recommendations  Skilled nursing-short term rehab (<3 hours/day)    Assistance Recommended at Discharge    Equipment Recommendations  [facility]    Recommendations for Other Services      Precautions / Restrictions Precautions Precautions: Fall Precaution Comments: increasing WBCs Restrictions Weight Bearing Restrictions: No       Mobility Bed Mobility                General bed mobility comments: NT up in recliner    Transfers                         Balance                                           ADL either performed or assessed with clinical judgement   ADL Overall ADL's : Needs assistance/impaired     Grooming: Sitting;Wash/dry face;Wash/dry hands Grooming Details (indicate cue type and reason): Pt up in recliner w/ back rest, set up for items on counter next to her, and able to wash face and apply moisturizers to face one task at a time with [place] doffed briefly, reapplied and Pt instructed in PLB to support breath recovery. SpO2 90-93% on 2L (down from 2.5L) and dropped to 88-89% on RA ~72min, HR up to 120. With PLB +39min and  reapplied, SpO2 improved back to 91-92% and HR back down to 105-112. RN notified.                                     Vision       Perception     Praxis      Cognition Arousal/Alertness: Awake/alert Behavior During Therapy: WFL for tasks assessed/performed Overall Cognitive Status: Within Functional Limits  for tasks assessed                                            Exercises Other Exercises Other Exercises: Pt instructed in PLB and activity pacing in context of seated grooming tasks and alternating [place] on and off. Pt required VC to initiate PLB and remember to reapply [place] in timely manner. Overall, tolerated well.   Shoulder Instructions       General Comments      Pertinent Vitals/ Pain          Home Living                                          Prior Functioning/Environment              Frequency  Min 2X/week        Progress Toward Goals  OT Goals(current goals can now be found in the care plan section)  Progress towards OT goals: Progressing toward goals  Acute Rehab OT Goals OT Goal Formulation: With patient Time For Goal Achievement: 01/17/21 Potential to Achieve Goals: Good  Plan Discharge  plan remains appropriate;Frequency remains appropriate    Co-evaluation                 AM-PAC OT "6 Clicks" Daily Activity     Outcome Measure   Help from another person eating meals?: None Help from another person taking care of personal grooming?: A Little Help from another person toileting, which includes using toliet, bedpan, or urinal?: A Lot Help from another person bathing (including washing, rinsing, drying)?: A Lot Help from another person to put on and taking off regular upper body clothing?: A Little Help from another person to put on and taking off regular lower body clothing?: A Lot 6 Click Score: 16    End of Session Equipment Utilized During Treatment: Oxygen  OT Visit Diagnosis: Unsteadiness on feet (R26.81);Muscle weakness (generalized) (M62.81);History of falling (Z91.81)   Activity Tolerance Patient tolerated treatment well   Patient Left in chair;with call bell/phone within reach;with chair alarm set   Nurse Communication Other (comment) (O2)        Time: 5784-6962 OT Time Calculation (min): 19 min  Charges: OT General Charges $OT Visit: 1 Visit OT Treatments $Self Care/Home Management : 8-22 mins  Arman Filter., MPH, MS, OTR/L ascom [phone] 01/13/21, 11:58 AM

## 2021-01-13 NOTE — Progress Notes (Signed)
Physical Therapy Treatment Patient Details Name: [name] MRN: [id number] DOB: [birth year] Today's Date: 01/13/2021   History of Present Illness 45 y.o. female with medical history significant for Bipolar disorder, diabetes, alcohol use disorder and polysubstance abuse, FSH muscular dystrophy, with history of frequent falls, last seen by neurology in January 2020, who was brought in by EMS after she fell at home 2 days prior and was unable to get up.    PT Comments    Pt seen for PT tx with pt received in recliner. Pt eager to attempt to stand but is unable even with Max assist +2 as pt demonstrates significant glute & hip extensor weakness & relies heavily on BUE to even attempt standing. Pt then completes lateral scoot from drop arm recliner>bed with max assist & improving ability to scoot this direction. Pt soiled 2/2 purewick malfunction so pt rolls L<>R to allow PT to change bed linens. Continue to recommend STR upon d/c to maximize independence with functional mobility & reduce fall risk prior to return home.    Recommendations for follow up therapy are one component of a multi-disciplinary discharge planning process, led by the attending physician.  Recommendations may be updated based on patient status, additional functional criteria and insurance authorization.  Follow Up Recommendations  Skilled nursing-short term rehab (<3 hours/day)     Assistance Recommended at Discharge Frequent or constant Supervision/Assistance  Equipment Recommendations   (TBD in next venue)    Recommendations for Other Services       Precautions / Restrictions Precautions Precautions: Fall Precaution Comments: increasing WBCs Restrictions Weight Bearing Restrictions: No     Mobility  Bed Mobility Overal bed mobility: Needs Assistance Bed Mobility: Sit to Supine;Rolling Rolling: Modified independent (Device/Increase time) (with use of bed rails)   Supine to sit: HOB elevated;Supervision  (reliance on bed rails & HOB elevated, heavily uses arms to upright body/trunk) Sit to supine: Supervision;HOB elevated   General bed mobility comments: NT up in recliner    Transfers Overall transfer level: Needs assistance Equipment used: Rolling walker (2 wheels);None Transfers: Sit to/from Stand;Lateral/Scoot Transfers Sit to Stand:  (pt attempts sit.stand with +2 but poor awareness of safe hand placement as she attempts to pull up on RW; pt unable to achieve full upright standing 2/2 weak glutes/hip extensors & only very minimal ability to clear buttocks from seat)          Lateral/Scoot Transfers: Max assist (pt completes lateral scoot drop arm recliner>bed with max assist with ongoing cuing for head hips relationship but improved ability to scoot L vs R (during earlier session)) General transfer comment: pt completes lateral scoot bed>recliner on R with drop arm recliner & surfaces at equal height with PT providing education/cuing for head/hips relationship & pt requiring MAX assist to complete several lateral scoots to finally achieve transferring to recliner    Ambulation/Gait                 Stairs             Wheelchair Mobility    Modified Rankin (Stroke Patients Only)       Balance Overall balance assessment: Needs assistance Sitting-balance support: Feet supported;Bilateral upper extremity supported Sitting balance-Leahy Scale: Fair                                      Cognition Arousal/Alertness: Awake/alert Behavior During Therapy: WFL for tasks  assessed/performed Overall Cognitive Status: Within Functional Limits for tasks assessed                                 General Comments: eager to participate        Exercises Other Exercises Other Exercises: Pt instructed in PLB and activity pacing in context of seated grooming tasks and alternating [place] on and off. Pt required VC to initiate PLB and remember to  reapply [place] in timely manner. Overall, tolerated well.    General Comments        Pertinent Vitals/Pain Pain Assessment: No/denies pain    Home Living                          Prior Function            PT Goals (current goals can now be found in the care plan section) Acute Rehab PT Goals Patient Stated Goal: to get stronger and stand again PT Goal Formulation: With patient Time For Goal Achievement: 01/18/21 Potential to Achieve Goals: Fair Progress towards PT goals: Progressing toward goals    Frequency    Min 2X/week      PT Plan Current plan remains appropriate    Co-evaluation              AM-PAC PT "6 Clicks" Mobility   Outcome Measure  Help needed turning from your back to your side while in a flat bed without using bedrails?: A Little Help needed moving from lying on your back to sitting on the side of a flat bed without using bedrails?: A Lot Help needed moving to and from a bed to a chair (including a wheelchair)?: Total Help needed standing up from a chair using your arms (e.g., wheelchair or bedside chair)?: Total Help needed to walk in hospital room?: Total Help needed climbing 3-5 steps with a railing? : Total 6 Click Score: 9    End of Session Equipment Utilized During Treatment: Oxygen Activity Tolerance: Patient tolerated treatment well;Patient limited by fatigue Patient left: in bed;with call bell/phone within reach;with bed alarm set   PT Visit Diagnosis: Other abnormalities of gait and mobility (R26.89);Muscle weakness (generalized) (M62.81);History of falling (Z91.81);Unsteadiness on feet (R26.81);Difficulty in walking, not elsewhere classified (R26.2)     Time: 4081-4481 PT Time Calculation (min) (ACUTE ONLY): 19 min  Charges:  $Therapeutic Activity: 8-22 mins                     Aleda Grana, PT, DPT 01/13/21, 12:51 PM    Sandi Mariscal 01/13/2021, 12:48 PM

## 2021-01-13 NOTE — Progress Notes (Signed)
Physical Therapy Treatment Patient Details Name: [name] MRN: [id number] DOB: 01-Mar-1976 Today's Date: 01/13/2021   History of Present Illness 45 y.o. female with medical history significant for Bipolar disorder, diabetes, alcohol use disorder and polysubstance abuse, FSH muscular dystrophy, with history of frequent falls, last seen by neurology in January 2020, who was brought in by EMS after she fell at home 2 days prior and was unable to get up.    PT Comments    Pt seen for PT tx with pt eager to participate. Pt is able to complete supine>sit without physical assistance but heavily reliance on hospital bed features. Session focused on lateral scoot bed>drop arm recliner with pt requiring max assist with heavy cuing for technique. Pt left in recliner with goal of sitting up for 30 minutes.     Recommendations for follow up therapy are one component of a multi-disciplinary discharge planning process, led by the attending physician.  Recommendations may be updated based on patient status, additional functional criteria and insurance authorization.  Follow Up Recommendations  Skilled nursing-short term rehab (<3 hours/day)     Assistance Recommended at Discharge Frequent or constant Supervision/Assistance  Equipment Recommendations   (TBD in next venue)    Recommendations for Other Services       Precautions / Restrictions Precautions Precautions: Fall Precaution Comments: increasing WBCs Restrictions Weight Bearing Restrictions: No     Mobility  Bed Mobility Overal bed mobility: Needs Assistance Bed Mobility: Supine to Sit Rolling:  (use of bed rails)   Supine to sit: HOB elevated;Supervision (reliance on bed rails & HOB elevated, heavily uses arms to upright body/trunk)     General bed mobility comments: NT up in recliner    Transfers Overall transfer level: Needs assistance Equipment used: None Transfers: Lateral/Scoot Transfers            Lateral/Scoot  Transfers: Max assist General transfer comment: pt completes lateral scoot bed>recliner on R with drop arm recliner & surfaces at equal height with PT providing education/cuing for head/hips relationship & pt requiring MAX assist to complete several lateral scoots to finally achieve transferring to recliner    Ambulation/Gait                 Stairs             Wheelchair Mobility    Modified Rankin (Stroke Patients Only)       Balance Overall balance assessment: Needs assistance Sitting-balance support: Feet supported;Bilateral upper extremity supported Sitting balance-Leahy Scale: Fair                                      Cognition Arousal/Alertness: Awake/alert Behavior During Therapy: WFL for tasks assessed/performed Overall Cognitive Status: Within Functional Limits for tasks assessed                                 General Comments: eager to participate        Exercises Other Exercises Other Exercises: Pt instructed in PLB and activity pacing in context of seated grooming tasks and alternating [place] on and off. Pt required VC to initiate PLB and remember to reapply [place] in timely manner. Overall, tolerated well.    General Comments        Pertinent Vitals/Pain Pain Assessment: No/denies pain    Home Living  Prior Function            PT Goals (current goals can now be found in the care plan section) Acute Rehab PT Goals Patient Stated Goal: to get stronger and stand again PT Goal Formulation: With patient Time For Goal Achievement: 01/18/21 Potential to Achieve Goals: Fair Progress towards PT goals: Progressing toward goals    Frequency    Min 2X/week      PT Plan Current plan remains appropriate    Co-evaluation              AM-PAC PT "6 Clicks" Mobility   Outcome Measure  Help needed turning from your back to your side while in a flat bed without using  bedrails?: A Little Help needed moving from lying on your back to sitting on the side of a flat bed without using bedrails?: A Lot Help needed moving to and from a bed to a chair (including a wheelchair)?: Total Help needed standing up from a chair using your arms (e.g., wheelchair or bedside chair)?: Total Help needed to walk in hospital room?: Total Help needed climbing 3-5 steps with a railing? : Total 6 Click Score: 9    End of Session Equipment Utilized During Treatment: Oxygen Activity Tolerance: Patient tolerated treatment well Patient left: in chair;with call bell/phone within reach   PT Visit Diagnosis: Other abnormalities of gait and mobility (R26.89);Muscle weakness (generalized) (M62.81);History of falling (Z91.81);Unsteadiness on feet (R26.81);Difficulty in walking, not elsewhere classified (R26.2)     Time: 1040-1057 PT Time Calculation (min) (ACUTE ONLY): 17 min  Charges:  $Therapeutic Activity: 8-22 mins                     Aleda Grana, PT, DPT 01/13/21, 12:45 PM    Sandi Mariscal 01/13/2021, 12:44 PM

## 2021-01-13 NOTE — Progress Notes (Signed)
Nutrition Follow Up Note   DOCUMENTATION CODES:   Morbid obesity  INTERVENTION:   Ensure Enlive po to TID, each supplement provides 350 kcal and 20 grams of protein  MVI po daily   NUTRITION DIAGNOSIS:   Inadequate oral intake related to acute illness as evidenced by per patient/family report.  GOAL:   Patient will meet greater than or equal to 90% of their needs -progressing   MONITOR:   PO intake, Supplement acceptance, Labs, Weight trends, Skin, I & O's  ASSESSMENT:   45 y.o. 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 Pt found to have cirrhosis and pulmonary hypertension.   Met with pt in room today. Pt reports having anxiety today as she feels she has no safe place to discharge to. Pt reports that she has continued to have good appetite and oral intake in hospital; pt eating 100% of meals and reports that she is drinking 3 Ensure per day. Per chart, pt is weight stable since admit. Pt pending SNF placement.   Medications reviewed and include: folic acid, lasix, insulin, MVI, protonix, aldactone, thiamine, vitamin D  Labs reviewed: Na 133(L), K 4.4 wnl, Mg 1.7 wnl P 3.5 wnl- 10/26 Wbc- 28.4(H), Hgb 8.5(L), Hct 26.9(L) Cbgs- 185, 209 x 24 hrs  Diet Order:   Diet Order             Diet Carb Modified Fluid consistency: Thin; Room service appropriate? Yes  Diet effective now                  EDUCATION NEEDS:   Education needs have been addressed  Skin:  Skin Assessment: Reviewed RN Assessment  Last BM:  10/28- type 4  Height:   Ht Readings from Last 1 Encounters:  12/17/20 $RemoveB'5\' 7"'[id number]$  (1.702 m)    Weight:   Wt Readings from Last 1 Encounters:  01/12/21 107.1 kg    Ideal Body Weight:  61.36 kg  BMI:  Body mass  index is 36.98 kg/m.  Estimated Nutritional Needs:   Kcal:  2100-2400kcal/day  Protein:  105-120g/day  Fluid:  1.9-2.2L/day  Koleen Distance MS, RD, LDN Please refer to [facility] for RD and/or RD on-call/weekend/after hours pager

## 2021-01-13 NOTE — Care Management Important Message (Signed)
Important Message  Patient Details  Name: [name] MRN: [id number] Date of Birth: 1976/01/16   Medicare Important Message Given:  Yes     [name] 01/13/2021, 10:21 AM

## 2021-01-14 DIAGNOSIS — F1020 Alcohol dependence, uncomplicated: Secondary | ICD-10-CM | POA: Diagnosis not present

## 2021-01-14 DIAGNOSIS — K709 Alcoholic liver disease, unspecified: Secondary | ICD-10-CM | POA: Diagnosis not present

## 2021-01-14 LAB — GLUCOSE, CAPILLARY
Glucose-Capillary: 213 mg/dL — ABNORMAL HIGH (ref 70–99)
Glucose-Capillary: 131 mg/dL — ABNORMAL HIGH (ref 70–99)
Glucose-Capillary: 256 mg/dL — ABNORMAL HIGH (ref 70–99)
Glucose-Capillary: 182 mg/dL — ABNORMAL HIGH (ref 70–99)

## 2021-01-14 MED ORDER — INSULIN ASPART 100 UNIT/ML IJ SOLN
8.0000 [IU] | Freq: Three times a day (TID) | INTRAMUSCULAR | Status: DC
  Administered 2021-01-14 – 2021-01-16 (×9): 8 [IU] via SUBCUTANEOUS
  Filled 2021-01-14 (×8): qty 1

## 2021-01-14 MED ORDER — INSULIN GLARGINE-YFGN 100 UNIT/ML ~~LOC~~ SOLN
25.0000 [IU] | Freq: Two times a day (BID) | SUBCUTANEOUS | Status: DC
  Administered 2021-01-14 – 2021-01-17 (×6): 25 [IU] via SUBCUTANEOUS
  Filled 2021-01-14 (×7): qty 0.25

## 2021-01-14 NOTE — Progress Notes (Addendum)
Progress Note    [name]  ONG:[id number] DOB: 1975-05-05  DOA: 12/17/2020 PCP: Eunice Blase, PA-C      Brief Narrative:    Medical records reviewed and are as summarized below:  [name] is a 45 y.o. female with medical history significant for bipolar disorder, type 2 diabetes mellitus, alcohol use disorder, polysubstance use disorder, FSHD muscular dystrophy, history of falls, who was brought to the hospital because of a fall and inability to get up on her own.  She was found to have sepsis secondary to aspiration pneumonia, severe hyponatremia with sodium level of 113, hypokalemia with potassium of 2.9, hyperbilirubinemia with bilirubin of 11.9 and elevated ammonia of 52.  She was treated with empiric IV antibiotics and IV fluids.  Magnesium, phosphorus and potassium were repleted.  She was placed on 2 L/min oxygen for acute hypoxemic respiratory failure.  She was given lactulose and rifaximin for hepatic encephalopathy.  She underwent paracentesis for ascites.  After completing antibiotic therapy (Unasyn and Zyvox), she continued to have persistent leukocytosis and she was found to have UTI.  ID was consulted to assist with management.    Assessment/Plan:   Principal Problem:   Alcohol use disorder, moderate, dependence ([name]) Active Problems:   Alcohol withdrawal ([name])   Bipolar 2 disorder ([name])   Hypokalemia   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[name])   Acute hyponatremia   Hepatic steatosis   Polysubstance abuse ([name])   Hyperglycemia due to type 2 diabetes mellitus ([name])   Hyperbilirubinemia   Lactic acidosis   Alcoholic liver disease ([name])   Sepsis ([name])   Acute hepatic encephalopathy   Pressure injury of skin   Aspiration pneumonia of both lower lobes due to gastric secretions ([name])   Nutrition Problem: Inadequate oral intake Etiology: acute illness  Signs/Symptoms: per patient/family report   Body mass index is 36.98 kg/m.  (Morbid  obesity)  Sepsis secondary to aspiration pneumonia, acute UTI, persistent leukocytosis: Completed IV cefepime on 01/11/2021.  Follow-up with ID for further recommendations.  Repeat CBC tomorrow  Alcoholic liver cirrhosis with ascites, liver failure, acute hepatic encephalopathy, hypoalbuminemia, associated severe pulmonary hypertension: S/p paracentesis x3 on this admission.  Most recent one was on 01/12/2021.  Continue diuretics and rifaximin  Alcohol use disorder, polysubstance use disorder: Continue thiamine and multivitamins.  Bipolar disorder: Continue psychotropics.  Follow-up with psychiatrist.  Type II DM with hyperglycemia: Increase insulin glargine from 22 units to 25 units twice daily and increase NovoLog from 6 units to 8 units 3 times daily.   Hyponatremia, hypokalemia, hypomagnesemia and hypokalemia phosphatemia: Improved.  Repeat BMP tomorrow  Anemia of chronic disease: H&H is stable.  Diet Order             Diet Carb Modified Fluid consistency: Thin; Room service appropriate? Yes  Diet effective now                      Consultants: Infectious disease, psychiatry, gastroenterology, nephrology  Procedures: Paracentesis on 12/30/2020 , 01/06/2021, 01/12/2021 Right-sided tunneled central catheter on 12/30/2020    Medications:    budesonide  9 mg Oral Daily   feeding supplement  237 mL Oral TID BM   FLUoxetine  80 mg Oral Daily   fluticasone  1 spray Each Nare Daily   folic acid  1 mg Oral Daily   furosemide  40 mg Oral Daily   insulin aspart  0-20 Units Subcutaneous TID WC   insulin aspart  0-5 Units Subcutaneous QHS   insulin aspart  8 Units Subcutaneous TID WC   insulin glargine-yfgn  25 Units Subcutaneous BID   multivitamin with minerals  1 tablet Oral Daily   OLANZapine  5 mg Oral QHS   pantoprazole  40 mg Oral BID   pregabalin  75 mg Oral TID   rifaximin  550 mg Oral BID   spironolactone  100 mg Oral Daily   thiamine  100 mg Oral Daily    Vitamin D (Ergocalciferol)  50,000 Units Oral Q7 days   Continuous Infusions:  sodium chloride Stopped (12/31/20 0118)     Anti-infectives (From admission, onward)    Start     Dose/Rate Route Frequency Ordered Stop   01/10/21 1400  rifaximin (XIFAXAN) tablet 550 mg        550 mg Oral 2 times daily 01/10/21 1302     01/09/21 1800  ceFEPIme (MAXIPIME) 2 g in sodium chloride 0.9 % 100 mL IVPB  Status:  Discontinued        2 g 200 mL/hr over 30 Minutes Intravenous Every 12 hours 01/09/21 1533 01/12/21 0855   01/07/21 1800  vancomycin (VANCOCIN) capsule 125 mg  Status:  Discontinued        125 mg Oral 4 times daily 01/07/21 1511 01/09/21 1924   01/06/21 1400  vancomycin (VANCOREADY) IVPB 2000 mg/400 mL  Status:  Discontinued        2,000 mg 200 mL/hr over 120 Minutes Intravenous Every 24 hours 01/05/21 1400 01/06/21 1114   01/05/21 1415  vancomycin (VANCOCIN) 2,000 mg in sodium chloride 0.9 % 500 mL IVPB  Status:  Discontinued        2,000 mg 250 mL/hr over 120 Minutes Intravenous  Once 01/05/21 1315 01/05/21 1317   01/05/21 1415  vancomycin (VANCOREADY) IVPB 2000 mg/400 mL        2,000 mg 200 mL/hr over 120 Minutes Intravenous  Once 01/05/21 1318 01/05/21 1803   01/04/21 1415  Ampicillin-Sulbactam (UNASYN) 3 g in sodium chloride 0.9 % 100 mL IVPB  Status:  Discontinued        3 g 200 mL/hr over 30 Minutes Intravenous Every 6 hours 01/04/21 1317 01/07/21 1031   12/22/20 2200  linezolid (ZYVOX) tablet 600 mg  Status:  Discontinued        600 mg Oral Every 12 hours 12/22/20 1143 12/27/20 1213   12/20/20 2200  vancomycin (VANCOCIN) IVPB 1000 mg/200 mL premix  Status:  Discontinued        1,000 mg 200 mL/hr over 60 Minutes Intravenous Every 12 hours 12/20/20 0839 12/22/20 1143   12/20/20 0930  vancomycin (VANCOCIN) 2,000 mg in sodium chloride 0.9 % 500 mL IVPB  Status:  Discontinued        2,000 mg 250 mL/hr over 120 Minutes Intravenous  Once 12/20/20 0839 12/20/20 0907   12/20/20 0930   vancomycin (VANCOCIN) 2,000 mg in sodium chloride 0.9 % 500 mL IVPB        2,000 mg 250 mL/hr over 120 Minutes Intravenous  Once 12/20/20 0907 12/20/20 1223   12/19/20 1630  rifaximin (XIFAXAN) tablet 550 mg  Status:  Discontinued        550 mg Oral 2 times daily 12/19/20 1533 01/07/21 1511   12/18/20 1200  Ampicillin-Sulbactam (UNASYN) 3 g in sodium chloride 0.9 % 100 mL IVPB  Status:  Discontinued        3 g 200 mL/hr over 30 Minutes Intravenous Every 6 hours 12/18/20  1038 12/20/20 0839   12/18/20 0045  Ampicillin-Sulbactam (UNASYN) 3 g in sodium chloride 0.9 % 100 mL IVPB        3 g 200 mL/hr over 30 Minutes Intravenous  Once 12/18/20 0033 12/18/20 0303              Family Communication/Anticipated D/C date and plan/Code Status   DVT prophylaxis:      Code Status: Full Code  Family Communication: None Disposition Plan: Awaiting placement to SNF    Status is: Inpatient  Remains inpatient appropriate because: Awaiting placement to rehab           Subjective:   No acute events overnight. No shortness of breath, chest pain or abdominal pain. Abdomen is distended  Objective:    Vitals:   01/13/21 1638 01/13/21 1939 01/14/21 0348 01/14/21 0731  BP: 102/65 113/63 119/79 110/64  Pulse: (!) 110 (!) 103 (!) 109 (!) 110  Resp: 16 20 20 18   Temp: (!) 97.4 F (36.3 C) 98.9 F (37.2 C) 98.4 F (36.9 C) 98.9 F (37.2 C)  TempSrc: Oral Oral Oral Oral  SpO2: 95% 93% 93% 93%  Weight:      Height:       No data found.   Intake/Output Summary (Last 24 hours) at 01/14/2021 1322 Last data filed at 01/14/2021 0403 Gross per 24 hour  Intake --  Output 1400 ml  Net -1400 ml   Filed Weights   01/10/21 0500 01/11/21 0500 01/12/21 0353  Weight: 108.5 kg 108.4 kg 107.1 kg    Exam:  GEN: NAD SKIN: Jaundice EYES: Icteric ENT: MMM CV: RRR PULM: CTA B ABD: soft, distended, NT, +BS CNS: AAO x 3, non focal EXT: No edema or tenderness        Data  Reviewed:   I have personally reviewed following labs and imaging studies:  Labs: Labs show the following:   Basic Metabolic Panel: Recent Labs  Lab 01/08/21 0442 01/09/21 0511 01/10/21 0518 01/11/21 0600 01/12/21 0800  NA 133* 135 133* 133* 133*  K 4.0 3.6 3.8 3.9 4.4  CL 88* 87* 91* 92* 94*  CO2 35* 34* 35* 35* 32  GLUCOSE 225* 161* 264* 247* 325*  BUN 17 20 20 18 17   CREATININE 0.53 0.55 0.55 0.69 0.62  CALCIUM 7.8* 8.0* 8.4* 8.1* 8.6*  MG 1.6* 1.8 1.6* 2.0 1.7  PHOS 3.0  --   --  3.5  --    GFR Estimated Creatinine Clearance: 113.1 mL/min (by C-G formula based on SCr of 0.62 mg/dL). Liver Function Tests: Recent Labs  Lab 01/08/21 0442 01/09/21 0511 01/10/21 0518 01/11/21 0600  AST 154* 143* 139* 128*  ALT 28 29 29 28   ALKPHOS 201* 219* 239* 232*  BILITOT 9.8* 9.6* 9.3* 8.3*  PROT 6.5 6.5 6.4* 6.5  ALBUMIN 1.5* <1.5* <1.5* <1.5*   No results for input(s): LIPASE, AMYLASE in the last 168 hours. Recent Labs  Lab 01/10/21 1000  AMMONIA 43*   Coagulation profile No results for input(s): INR, PROTIME in the last 168 hours.  CBC: Recent Labs  Lab 01/08/21 0442 01/09/21 0511 01/10/21 0518 01/11/21 0600 01/13/21 0545  WBC 32.8* 34.0* 29.7* 28.4* 28.4*  NEUTROABS 28.1* 28.6* 24.7* 23.2* 23.4*  HGB 8.0* 8.2* 8.4* 8.2* 8.5*  HCT 25.9* 26.0* 26.9* 26.7* 26.9*  MCV 113.1* 115.0* 115.5* 114.1* 112.1*  PLT 250 282 305 289 273   Cardiac Enzymes: No results for input(s): CKTOTAL, CKMB, CKMBINDEX, TROPONINI in the last 168  hours. BNP (last 3 results) No results for input(s): PROBNP in the last 8760 hours. CBG: Recent Labs  Lab 01/13/21 1146 01/13/21 1635 01/13/21 2040 01/14/21 0735 01/14/21 1211  GLUCAP 209* 238* 257* 213* 131*   D-Dimer: No results for input(s): DDIMER in the last 72 hours. Hgb A1c: No results for input(s): HGBA1C in the last 72 hours. Lipid Profile: No results for input(s): CHOL, HDL, LDLCALC, TRIG, CHOLHDL, LDLDIRECT in the  last 72 hours. Thyroid function studies: No results for input(s): TSH, T4TOTAL, T3FREE, THYROIDAB in the last 72 hours.  Invalid input(s): FREET3 Anemia work up: No results for input(s): VITAMINB12, FOLATE, FERRITIN, TIBC, IRON, RETICCTPCT in the last 72 hours. Sepsis Labs: Recent Labs  Lab 01/09/21 0511 01/10/21 0518 01/11/21 0600 01/13/21 0545  WBC 34.0* 29.7* 28.4* 28.4*    Microbiology Recent Results (from the past 240 hour(s))  Cath Tip Culture     Status: None   Collection Time: 01/04/21  1:59 PM   Specimen: Catheter Tip; Other  Result Value Ref Range Status   Specimen Description   Final    CATH TIP Performed at [facility], [address] [place], [place] [address]    Special Requests   Final    NONE Performed at [facility], [address], [place], [place] [address]    Culture   Final    NO GROWTH 2 DAYS Performed at [facility], [address], [place], [place] [address]    Report Status 01/07/2021 FINAL  Final  Culture, blood (single) w Reflex to ID Panel     Status: None   Collection Time: 01/04/21  2:00 PM   Specimen: BLOOD  Result Value Ref Range Status   Specimen Description BLOOD ALIN  Final   Special Requests   Final    BOTTLES DRAWN AEROBIC AND ANAEROBIC Blood Culture results may not be optimal due to an excessive volume of blood received in culture bottles   Culture   Final    NO GROWTH 5 DAYS Performed at [facility], [address], [place], [place] [address]    Report Status 01/09/2021 FINAL  Final  Culture, blood (single) w Reflex to ID Panel     Status: None   Collection Time: 01/04/21  2:35 PM   Specimen: BLOOD  Result Value Ref Range Status   Specimen Description BLOOD LEFT ANTECUBITAL  Final   Special Requests   Final    BOTTLES DRAWN AEROBIC AND ANAEROBIC Blood Culture adequate volume   Culture   Final    NO GROWTH 5 DAYS Performed at [facility], [address], [place],  [place] [address]    Report Status 01/09/2021 FINAL  Final  Body fluid culture w Gram Stain     Status: None   Collection Time: 01/06/21 10:55 AM   Specimen: PATH Cytology Peritoneal fluid  Result Value Ref Range Status   Specimen Description   Final    PERITONEAL Performed at [facility], [address] [place], [place] [address]    Special Requests   Final    NONE Performed at [facility], [address] [place], [place] [address]    Gram Stain   Final    FEW WBC PRESENT, PREDOMINANTLY MONONUCLEAR NO ORGANISMS SEEN    Culture   Final    NO GROWTH 3 DAYS Performed at [facility], [address], [place], [place] [address]    Report Status 01/09/2021 FINAL  Final  Urine Culture     Status: Abnormal   Collection Time: 01/09/21  5:30 AM   Specimen: Urine, Clean Catch  Result Value Ref Range Status   Specimen Description   Final    URINE, CLEAN CATCH Performed at [facility], [address] [place], [place] [address]    Special Requests   Final    NONE Performed at [facility], [address] [place], [place] [address]    Culture (A)  Final    30,000 COLONIES/mL MULTIPLE SPECIES PRESENT, SUGGEST RECOLLECTION   Report Status 01/10/2021 FINAL  Final    Procedures and diagnostic studies:  No results found.             LOS: 27 days   Zacharius Funari  [employer] loss adjuster on [email or web address]. If 7PM-7AM, please contact night-coverage at [email or web address]     01/14/2021, 1:22 PM

## 2021-01-15 DIAGNOSIS — F1020 Alcohol dependence, uncomplicated: Secondary | ICD-10-CM | POA: Diagnosis not present

## 2021-01-15 DIAGNOSIS — K709 Alcoholic liver disease, unspecified: Secondary | ICD-10-CM | POA: Diagnosis not present

## 2021-01-15 LAB — CBC WITH DIFFERENTIAL/PLATELET
Abs Immature Granulocytes: 0.95 10*3/uL — ABNORMAL HIGH (ref 0.00–0.07)
Basophils Absolute: 0.1 10*3/uL (ref 0.0–0.1)
Basophils Relative: 0 %
Eosinophils Absolute: 0.3 10*3/uL (ref 0.0–0.5)
Eosinophils Relative: 1 %
HCT: 27.7 % — ABNORMAL LOW (ref 36.0–46.0)
Hemoglobin: 8.6 g/dL — ABNORMAL LOW (ref 12.0–15.0)
Immature Granulocytes: 3 %
Lymphocytes Relative: 10 %
Lymphs Abs: 3.2 10*3/uL (ref 0.7–4.0)
MCH: 34.1 pg — ABNORMAL HIGH (ref 26.0–34.0)
MCHC: 31 g/dL (ref 30.0–36.0)
MCV: 109.9 fL — ABNORMAL HIGH (ref 80.0–100.0)
Monocytes Absolute: 1.3 10*3/uL — ABNORMAL HIGH (ref 0.1–1.0)
Monocytes Relative: 4 %
Neutro Abs: 27.4 10*3/uL — ABNORMAL HIGH (ref 1.7–7.7)
Neutrophils Relative %: 82 %
Platelets: 267 10*3/uL (ref 150–400)
RBC: 2.52 MIL/uL — ABNORMAL LOW (ref 3.87–5.11)
RDW: 17.4 % — ABNORMAL HIGH (ref 11.5–15.5)
Smear Review: NORMAL
WBC: 33.2 10*3/uL — ABNORMAL HIGH (ref 4.0–10.5)
nRBC: 0.1 % (ref 0.0–0.2)

## 2021-01-15 LAB — COMPREHENSIVE METABOLIC PANEL
ALT: 29 U/L (ref 0–44)
AST: 126 U/L — ABNORMAL HIGH (ref 15–41)
Albumin: <1.5 g/dL — ABNORMAL LOW (ref 3.5–5.0)
Alkaline Phosphatase: 272 U/L — ABNORMAL HIGH (ref 38–126)
Anion gap: 7 (ref 5–15)
BUN: 19 mg/dL (ref 6–20)
CO2: 34 mmol/L — ABNORMAL HIGH (ref 22–32)
Calcium: 8.4 mg/dL — ABNORMAL LOW (ref 8.9–10.3)
Chloride: 93 mmol/L — ABNORMAL LOW (ref 98–111)
Creatinine, Ser: 0.63 mg/dL (ref 0.44–1.00)
GFR, Estimated: >60 mL/min (ref >60)
Glucose, Bld: 181 mg/dL — ABNORMAL HIGH (ref 70–99)
Potassium: 4.1 mmol/L (ref 3.5–5.1)
Sodium: 134 mmol/L — ABNORMAL LOW (ref 135–145)
Total Bilirubin: 6.9 mg/dL — ABNORMAL HIGH (ref 0.3–1.2)
Total Protein: 6.6 g/dL (ref 6.5–8.1)

## 2021-01-15 LAB — GLUCOSE, CAPILLARY
Glucose-Capillary: 245 mg/dL — ABNORMAL HIGH (ref 70–99)
Glucose-Capillary: 395 mg/dL — ABNORMAL HIGH (ref 70–99)
Glucose-Capillary: 278 mg/dL — ABNORMAL HIGH (ref 70–99)
Glucose-Capillary: 207 mg/dL — ABNORMAL HIGH (ref 70–99)

## 2021-01-15 LAB — MAGNESIUM: Magnesium: 1.5 mg/dL — ABNORMAL LOW (ref 1.7–2.4)

## 2021-01-15 MED ORDER — COVID-19MRNA BIVAL VACC PFIZER 30 MCG/0.3ML IM SUSP
0.3000 mL | Freq: Once | INTRAMUSCULAR | Status: AC
  Administered 2021-01-16: 0.3 mL via INTRAMUSCULAR
  Filled 2021-01-15: qty 0.3

## 2021-01-15 MED ORDER — INFLUENZA VAC SPLIT QUAD 0.5 ML IM SUSY
0.5000 mL | PREFILLED_SYRINGE | INTRAMUSCULAR | Status: AC
  Administered 2021-01-16: 0.5 mL via INTRAMUSCULAR
  Filled 2021-01-15 (×2): qty 0.5

## 2021-01-15 MED ORDER — INFLUENZA VAC SPLIT QUAD 0.5 ML IM SUSY: 0.5000 mL | PREFILLED_SYRINGE | Freq: Once | INTRAMUSCULAR | Status: DC

## 2021-01-15 MED ORDER — COVID-19 MRNA VAC-TRIS(PFIZER) 30 MCG/0.3ML IM SUSP
0.3000 mL | Freq: Once | INTRAMUSCULAR | Status: DC
  Filled 2021-01-15: qty 0.3

## 2021-01-15 MED ORDER — MAGNESIUM SULFATE 2 GM/50ML IV SOLN
2.0000 g | Freq: Once | INTRAVENOUS | Status: AC
  Administered 2021-01-15: 2 g via INTRAVENOUS
  Filled 2021-01-15: qty 50

## 2021-01-15 NOTE — Progress Notes (Addendum)
Progress Note    [name]  LTJ:[id number] DOB: May 15, 1975  DOA: 12/17/2020 PCP: Eunice Blase, PA-C      Brief Narrative:    Medical records reviewed and are as summarized below:  [name] is a 45 y.o. female with medical history significant for bipolar disorder, type 2 diabetes mellitus, alcohol use disorder, polysubstance use disorder, FSHD muscular dystrophy, history of falls, who was brought to the hospital because of a fall and inability to get up on her own.  She was found to have sepsis secondary to aspiration pneumonia, severe hyponatremia with sodium level of 113, hypokalemia with potassium of 2.9, hyperbilirubinemia with bilirubin of 11.9 and elevated ammonia of 52.  She was treated with empiric IV antibiotics and IV fluids.  Magnesium, phosphorus and potassium were repleted.  She was placed on 2 L/min oxygen for acute hypoxemic respiratory failure.  She was given lactulose and rifaximin for hepatic encephalopathy.  She underwent paracentesis for ascites.  After completing antibiotic therapy (Unasyn and Zyvox), she continued to have persistent leukocytosis and she was found to have UTI.  ID was consulted to assist with management.    Assessment/Plan:   Principal Problem:   Alcohol use disorder, moderate, dependence ([name]) Active Problems:   Alcohol withdrawal ([name])   Bipolar 2 disorder ([name])   Hypokalemia   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[name])   Acute hyponatremia   Hepatic steatosis   Polysubstance abuse ([name])   Hyperglycemia due to type 2 diabetes mellitus ([name])   Hyperbilirubinemia   Lactic acidosis   Alcoholic liver disease ([name])   Sepsis ([name])   Acute hepatic encephalopathy   Pressure injury of skin   Aspiration pneumonia of both lower lobes due to gastric secretions ([name])   Nutrition Problem: Inadequate oral intake Etiology: acute illness  Signs/Symptoms: per patient/family report   Body mass index is 36.98 kg/m.  (Morbid  obesity)  Sepsis secondary to aspiration pneumonia, acute UTI, persistent leukocytosis: Completed IV cefepime on 01/11/2021.  WBC still very high.  ID recommended monitoring off of antibiotics.  Follow-up with ID for further recommendations.  Repeat CBC tomorrow  Alcoholic liver cirrhosis with ascites, liver failure, acute hepatic encephalopathy, hypoalbuminemia, associated severe pulmonary hypertension: S/p paracentesis x3 on this admission.  Most recent one was on 01/12/2021.  Continue diuretics and rifaximin  Alcohol use disorder, polysubstance use disorder: Continue thiamine and multivitamins.  Bipolar disorder: Continue psychotropics.  Follow-up with psychiatrist.  Type II DM with hyperglycemia: Continue insulin glargine at 25 units twice daily and NovoLog at 8 units 3 times daily with meals.    Hyponatremia, hypokalemia, hypomagnesemia and hypokalemia phosphatemia: Magnesium dropped to 1.5.  Replete with IV magnesium sulfate.  Anemia of chronic disease: H&H is stable.  Influenza vaccine and COVID-19 booster vaccine have been ordered  Diet Order             Diet Carb Modified Fluid consistency: Thin; Room service appropriate? Yes  Diet effective now                      Consultants: Infectious disease, psychiatry, gastroenterology, nephrology  Procedures: Paracentesis on 12/30/2020 , 01/06/2021, 01/12/2021 Right-sided tunneled central catheter on 12/30/2020    Medications:    budesonide  9 mg Oral Daily   COVID-19 mRNA Vac-TriS (Pf[employer])  0.3 mL Intramuscular Once   feeding supplement  237 mL Oral TID BM   FLUoxetine  80 mg Oral Daily   fluticasone  1 spray  Each Nare Daily   folic acid  1 mg Oral Daily   furosemide  40 mg Oral Daily   [START ON 01/16/2021] influenza vac split quadrivalent PF  0.5 mL Intramuscular Tomorrow-1000   insulin aspart  0-20 Units Subcutaneous TID WC   insulin aspart  0-5 Units Subcutaneous QHS   insulin aspart  8 Units Subcutaneous  TID WC   insulin glargine-yfgn  25 Units Subcutaneous BID   multivitamin with minerals  1 tablet Oral Daily   OLANZapine  5 mg Oral QHS   pantoprazole  40 mg Oral BID   pregabalin  75 mg Oral TID   rifaximin  550 mg Oral BID   spironolactone  100 mg Oral Daily   thiamine  100 mg Oral Daily   Vitamin D (Ergocalciferol)  50,000 Units Oral Q7 days   Continuous Infusions:  sodium chloride Stopped (12/31/20 0118)     Anti-infectives (From admission, onward)    Start     Dose/Rate Route Frequency Ordered Stop   01/10/21 1400  rifaximin (XIFAXAN) tablet 550 mg        550 mg Oral 2 times daily 01/10/21 1302     01/09/21 1800  ceFEPIme (MAXIPIME) 2 g in sodium chloride 0.9 % 100 mL IVPB  Status:  Discontinued        2 g 200 mL/hr over 30 Minutes Intravenous Every 12 hours 01/09/21 1533 01/12/21 0855   01/07/21 1800  vancomycin (VANCOCIN) capsule 125 mg  Status:  Discontinued        125 mg Oral 4 times daily 01/07/21 1511 01/09/21 1924   01/06/21 1400  vancomycin (VANCOREADY) IVPB 2000 mg/400 mL  Status:  Discontinued        2,000 mg 200 mL/hr over 120 Minutes Intravenous Every 24 hours 01/05/21 1400 01/06/21 1114   01/05/21 1415  vancomycin (VANCOCIN) 2,000 mg in sodium chloride 0.9 % 500 mL IVPB  Status:  Discontinued        2,000 mg 250 mL/hr over 120 Minutes Intravenous  Once 01/05/21 1315 01/05/21 1317   01/05/21 1415  vancomycin (VANCOREADY) IVPB 2000 mg/400 mL        2,000 mg 200 mL/hr over 120 Minutes Intravenous  Once 01/05/21 1318 01/05/21 1803   01/04/21 1415  Ampicillin-Sulbactam (UNASYN) 3 g in sodium chloride 0.9 % 100 mL IVPB  Status:  Discontinued        3 g 200 mL/hr over 30 Minutes Intravenous Every 6 hours 01/04/21 1317 01/07/21 1031   12/22/20 2200  linezolid (ZYVOX) tablet 600 mg  Status:  Discontinued        600 mg Oral Every 12 hours 12/22/20 1143 12/27/20 1213   12/20/20 2200  vancomycin (VANCOCIN) IVPB 1000 mg/200 mL premix  Status:  Discontinued        1,000  mg 200 mL/hr over 60 Minutes Intravenous Every 12 hours 12/20/20 0839 12/22/20 1143   12/20/20 0930  vancomycin (VANCOCIN) 2,000 mg in sodium chloride 0.9 % 500 mL IVPB  Status:  Discontinued        2,000 mg 250 mL/hr over 120 Minutes Intravenous  Once 12/20/20 0839 12/20/20 0907   12/20/20 0930  vancomycin (VANCOCIN) 2,000 mg in sodium chloride 0.9 % 500 mL IVPB        2,000 mg 250 mL/hr over 120 Minutes Intravenous  Once 12/20/20 0907 12/20/20 1223   12/19/20 1630  rifaximin (XIFAXAN) tablet 550 mg  Status:  Discontinued  550 mg Oral 2 times daily 12/19/20 1533 01/07/21 1511   12/18/20 1200  Ampicillin-Sulbactam (UNASYN) 3 g in sodium chloride 0.9 % 100 mL IVPB  Status:  Discontinued        3 g 200 mL/hr over 30 Minutes Intravenous Every 6 hours 12/18/20 1038 12/20/20 0839   12/18/20 0045  Ampicillin-Sulbactam (UNASYN) 3 g in sodium chloride 0.9 % 100 mL IVPB        3 g 200 mL/hr over 30 Minutes Intravenous  Once 12/18/20 0033 12/18/20 0303              Family Communication/Anticipated D/C date and plan/Code Status   DVT prophylaxis:      Code Status: Full Code  Family Communication: None Disposition Plan: Awaiting placement to SNF    Status is: Inpatient  Remains inpatient appropriate because: Awaiting placement to rehab           Subjective:   Interval events noted.  She requested COVID-19 booster vaccine and influenza vaccine.    Objective:    Vitals:   01/14/21 1631 01/14/21 2001 01/15/21 0506 01/15/21 0740  BP: 110/66 113/65 113/78 121/63  Pulse: (!) 110 (!) 110 (!) 119 (!) 117  Resp: 18 20 16 18   Temp: 98.2 F (36.8 C) 98.7 F (37.1 C) 99.4 F (37.4 C) 98.6 F (37 C)  TempSrc: Oral Oral Oral Oral  SpO2: 96% 96% 94% 97%  Weight:      Height:       No data found.   Intake/Output Summary (Last 24 hours) at 01/15/2021 1153 Last data filed at 01/15/2021 1030 Gross per 24 hour  Intake 260 ml  Output 1651 ml  Net -1391 ml    Filed Weights   01/10/21 0500 01/11/21 0500 01/12/21 0353  Weight: 108.5 kg 108.4 kg 107.1 kg    Exam:  GEN: NAD SKIN: Jaundice EYES: Icteric ENT: MMM CV: RRR PULM: CTA B ABD: soft, distended, NT, +BS CNS: AAO x 3, non focal EXT: No edema or tenderness         Data Reviewed:   I have personally reviewed following labs and imaging studies:  Labs: Labs show the following:   Basic Metabolic Panel: Recent Labs  Lab 01/09/21 0511 01/10/21 0518 01/11/21 0600 01/12/21 0800 01/15/21 0412  NA 135 133* 133* 133* 134*  K 3.6 3.8 3.9 4.4 4.1  CL 87* 91* 92* 94* 93*  CO2 34* 35* 35* 32 34*  GLUCOSE 161* 264* 247* 325* 181*  BUN 20 20 18 17 19   CREATININE 0.55 0.55 0.69 0.62 0.63  CALCIUM 8.0* 8.4* 8.1* 8.6* 8.4*  MG 1.8 1.6* 2.0 1.7 1.5*  PHOS  --   --  3.5  --   --    GFR Estimated Creatinine Clearance: 113.1 mL/min (by C-G formula based on SCr of 0.63 mg/dL). Liver Function Tests: Recent Labs  Lab 01/09/21 0511 01/10/21 0518 01/11/21 0600 01/15/21 0412  AST 143* 139* 128* 126*  ALT 29 29 28 29   ALKPHOS 219* 239* 232* 272*  BILITOT 9.6* 9.3* 8.3* 6.9*  PROT 6.5 6.4* 6.5 6.6  ALBUMIN <1.5* <1.5* <1.5* <1.5*   No results for input(s): LIPASE, AMYLASE in the last 168 hours. Recent Labs  Lab 01/10/21 1000  AMMONIA 43*   Coagulation profile No results for input(s): INR, PROTIME in the last 168 hours.  CBC: Recent Labs  Lab 01/09/21 0511 01/10/21 0518 01/11/21 0600 01/13/21 0545 01/15/21 0412  WBC 34.0* 29.7* 28.4* 28.4* 33.2*  NEUTROABS 28.6* 24.7* 23.2* 23.4* 27.4*  HGB 8.2* 8.4* 8.2* 8.5* 8.6*  HCT 26.0* 26.9* 26.7* 26.9* 27.7*  MCV 115.0* 115.5* 114.1* 112.1* 109.9*  PLT 282 305 289 273 267   Cardiac Enzymes: No results for input(s): CKTOTAL, CKMB, CKMBINDEX, TROPONINI in the last 168 hours. BNP (last 3 results) No results for input(s): PROBNP in the last 8760 hours. CBG: Recent Labs  Lab 01/14/21 0735 01/14/21 1211 01/14/21 1628  01/14/21 2100 01/15/21 0742  GLUCAP 213* 131* 256* 182* 245*   D-Dimer: No results for input(s): DDIMER in the last 72 hours. Hgb A1c: No results for input(s): HGBA1C in the last 72 hours. Lipid Profile: No results for input(s): CHOL, HDL, LDLCALC, TRIG, CHOLHDL, LDLDIRECT in the last 72 hours. Thyroid function studies: No results for input(s): TSH, T4TOTAL, T3FREE, THYROIDAB in the last 72 hours.  Invalid input(s): FREET3 Anemia work up: No results for input(s): VITAMINB12, FOLATE, FERRITIN, TIBC, IRON, RETICCTPCT in the last 72 hours. Sepsis Labs: Recent Labs  Lab 01/10/21 0518 01/11/21 0600 01/13/21 0545 01/15/21 0412  WBC 29.7* 28.4* 28.4* 33.2*    Microbiology Recent Results (from the past 240 hour(s))  Body fluid culture w Gram Stain     Status: None   Collection Time: 01/06/21 10:55 AM   Specimen: PATH Cytology Peritoneal fluid  Result Value Ref Range Status   Specimen Description   Final    PERITONEAL Performed at [facility], [address], [place], [place] [address]    Special Requests   Final    NONE Performed at [facility], [address] [place], [place] [address]    Gram Stain   Final    FEW WBC PRESENT, PREDOMINANTLY MONONUCLEAR NO ORGANISMS SEEN    Culture   Final    NO GROWTH 3 DAYS Performed at [facility], [address] [place], [place] [address]    Report Status 01/09/2021 FINAL  Final  Urine Culture     Status: Abnormal   Collection Time: 01/09/21  5:30 AM   Specimen: Urine, Clean Catch  Result Value Ref Range Status   Specimen Description   Final    URINE, CLEAN CATCH Performed at [facility], [address], [place], [place] [address]    Special Requests   Final    NONE Performed at [facility], [address] [place], [place] [address]    Culture (A)  Final    30,000 COLONIES/mL MULTIPLE SPECIES PRESENT, SUGGEST RECOLLECTION   Report Status 01/10/2021 FINAL  Final     Procedures and diagnostic studies:  No results found.             LOS: 28 days   Meleni Delahunt  [employer] loss adjuster on [email or web address]. If 7PM-7AM, please contact night-coverage at [email or web address]     01/15/2021, 11:53 AM

## 2021-01-15 NOTE — Plan of Care (Signed)
  Problem: Education: Goal: Knowledge of General Education information will improve Description: Including pain rating scale, medication(s)/side effects and non-pharmacologic comfort measures Outcome: Progressing   Problem: Health Behavior/Discharge Planning: Goal: Ability to manage health-related needs will improve Outcome: Progressing   Problem: Clinical Measurements: Goal: Ability to maintain clinical measurements within normal limits will improve Outcome: Progressing Goal: Will remain free from infection Outcome: Progressing Goal: Diagnostic test results will improve Outcome: Progressing Goal: Respiratory complications will improve Outcome: Progressing Goal: Cardiovascular complication will be avoided Outcome: Progressing   Problem: Activity: Goal: Risk for activity intolerance will decrease Outcome: Progressing   Problem: Nutrition: Goal: Adequate nutrition will be maintained Outcome: Progressing   Problem: Coping: Goal: Level of anxiety will decrease Outcome: Progressing   Problem: Elimination: Goal: Will not experience complications related to bowel motility Outcome: Progressing Goal: Will not experience complications related to urinary retention Outcome: Progressing   Problem: Pain Managment: Goal: General experience of comfort will improve Outcome: Progressing   Problem: Safety: Goal: Ability to remain free from injury will improve Outcome: Progressing   Problem: Skin Integrity: Goal: Risk for impaired skin integrity will decrease Outcome: Progressing   Problem: Education: Goal: Knowledge of disease or condition will improve Outcome: Progressing Goal: Understanding of discharge needs will improve Outcome: Progressing   Problem: Health Behavior/Discharge Planning: Goal: Ability to identify changes in lifestyle to reduce recurrence of condition will improve Outcome: Progressing Goal: Identification of resources available to assist in meeting health  care needs will improve Outcome: Progressing   Problem: Physical Regulation: Goal: Complications related to the disease process, condition or treatment will be avoided or minimized Outcome: Progressing   Problem: Safety: Goal: Ability to remain free from injury will improve Outcome: Progressing   Problem: Activity: Goal: Ability to tolerate increased activity will improve Outcome: Progressing   Problem: Clinical Measurements: Goal: Ability to maintain a body temperature in the normal range will improve Outcome: Progressing   Problem: Respiratory: Goal: Ability to maintain adequate ventilation will improve Outcome: Progressing Goal: Ability to maintain a clear airway will improve Outcome: Progressing

## 2021-01-16 DIAGNOSIS — J690 Pneumonitis due to inhalation of food and vomit: Secondary | ICD-10-CM | POA: Diagnosis not present

## 2021-01-16 DIAGNOSIS — F1020 Alcohol dependence, uncomplicated: Secondary | ICD-10-CM | POA: Diagnosis not present

## 2021-01-16 DIAGNOSIS — K709 Alcoholic liver disease, unspecified: Secondary | ICD-10-CM | POA: Diagnosis not present

## 2021-01-16 DIAGNOSIS — K7011 Alcoholic hepatitis with ascites: Secondary | ICD-10-CM | POA: Diagnosis not present

## 2021-01-16 LAB — GLUCOSE, CAPILLARY
Glucose-Capillary: 200 mg/dL — ABNORMAL HIGH (ref 70–99)
Glucose-Capillary: 232 mg/dL — ABNORMAL HIGH (ref 70–99)
Glucose-Capillary: 228 mg/dL — ABNORMAL HIGH (ref 70–99)
Glucose-Capillary: 248 mg/dL — ABNORMAL HIGH (ref 70–99)

## 2021-01-16 LAB — CBC WITH DIFFERENTIAL/PLATELET
Abs Immature Granulocytes: 0.95 10*3/uL — ABNORMAL HIGH (ref 0.00–0.07)
Basophils Absolute: 0.1 10*3/uL (ref 0.0–0.1)
Basophils Relative: 0 %
Eosinophils Absolute: 0.3 10*3/uL (ref 0.0–0.5)
Eosinophils Relative: 1 %
HCT: 29.5 % — ABNORMAL LOW (ref 36.0–46.0)
Hemoglobin: 9.1 g/dL — ABNORMAL LOW (ref 12.0–15.0)
Immature Granulocytes: 3 %
Lymphocytes Relative: 9 %
Lymphs Abs: 3.1 10*3/uL (ref 0.7–4.0)
MCH: 34.6 pg — ABNORMAL HIGH (ref 26.0–34.0)
MCHC: 30.8 g/dL (ref 30.0–36.0)
MCV: 112.2 fL — ABNORMAL HIGH (ref 80.0–100.0)
Monocytes Absolute: 1.1 10*3/uL — ABNORMAL HIGH (ref 0.1–1.0)
Monocytes Relative: 3 %
Neutro Abs: 27.2 10*3/uL — ABNORMAL HIGH (ref 1.7–7.7)
Neutrophils Relative %: 84 %
Platelets: 255 10*3/uL (ref 150–400)
RBC: 2.63 MIL/uL — ABNORMAL LOW (ref 3.87–5.11)
RDW: 17 % — ABNORMAL HIGH (ref 11.5–15.5)
Smear Review: NORMAL
WBC: 32.8 10*3/uL — ABNORMAL HIGH (ref 4.0–10.5)
nRBC: 0.1 % (ref 0.0–0.2)

## 2021-01-16 LAB — MAGNESIUM: Magnesium: 1.5 mg/dL — ABNORMAL LOW (ref 1.7–2.4)

## 2021-01-16 MED ORDER — MAGNESIUM OXIDE -MG SUPPLEMENT 400 (240 MG) MG PO TABS
400.0000 mg | ORAL_TABLET | Freq: Every day | ORAL | Status: DC
  Administered 2021-01-16 – 2021-01-17 (×2): 400 mg via ORAL
  Filled 2021-01-16 (×2): qty 1

## 2021-01-16 MED ORDER — FUROSEMIDE 40 MG PO TABS
40.0000 mg | ORAL_TABLET | Freq: Two times a day (BID) | ORAL | Status: DC
  Administered 2021-01-16 – 2021-01-20 (×9): 40 mg via ORAL
  Filled 2021-01-16 (×9): qty 1

## 2021-01-16 MED ORDER — MAGNESIUM SULFATE 2 GM/50ML IV SOLN
2.0000 g | Freq: Once | INTRAVENOUS | Status: AC
  Administered 2021-01-16: 2 g via INTRAVENOUS
  Filled 2021-01-16: qty 50

## 2021-01-16 NOTE — Progress Notes (Signed)
Date of Admission:  12/17/2020      ID: [name] is a 45 y.o. female with   Principal Problem:   Alcohol use disorder, moderate, dependence ([place]) Active Problems:   Alcohol withdrawal ([place])   Bipolar 2 disorder ([name])   Hypokalemia   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[place])   Acute hyponatremia   Hepatic steatosis   Polysubstance abuse ([name])   Hyperglycemia due to type 2 diabetes mellitus ([name])   Hyperbilirubinemia   Lactic acidosis   Alcoholic liver disease ([name])   Sepsis ([place])   Acute hepatic encephalopathy   Pressure injury of skin   Aspiration pneumonia of both lower lobes due to gastric secretions ([name])    Subjective: Pt doing better Sat in chair for 2 hours and stood for 30 sec No diarrhea Rather constipated No cough On nasal cannula for some sob Some abdominal distension No dysuria  Medications:   budesonide  9 mg Oral Daily   feeding supplement  237 mL Oral TID BM   FLUoxetine  80 mg Oral Daily   fluticasone  1 spray Each Nare Daily   folic acid  1 mg Oral Daily   furosemide  40 mg Oral BID   insulin aspart  0-20 Units Subcutaneous TID WC   insulin aspart  0-5 Units Subcutaneous QHS   insulin aspart  8 Units Subcutaneous TID WC   insulin glargine-yfgn  25 Units Subcutaneous BID   magnesium oxide  400 mg Oral Daily   multivitamin with minerals  1 tablet Oral Daily   OLANZapine  5 mg Oral QHS   pantoprazole  40 mg Oral BID   pregabalin  75 mg Oral TID   rifaximin  550 mg Oral BID   spironolactone  100 mg Oral Daily   thiamine  100 mg Oral Daily   Vitamin D (Ergocalciferol)  50,000 Units Oral Q7 days    Objective: Vital signs in last 24 hours: Temp:  [97.8 F (36.6 C)-98.7 F (37.1 C)] 98.1 F (36.7 C) (10/31 0800) Pulse Rate:  [99-110] 110 (10/31 0800) Resp:  [16-18] 17 (10/31 0800) BP: (108-123)/(69-82) 112/70 (10/31 0800) SpO2:  [93 %-96 %] 94 % (10/31 0800)  PHYSICAL EXAM:  General: Alert, cooperative, no distress,  icteric sclera  ENT Nares normal. No drainage or sinus tenderness. Lips, mucosa, and tongue normal. No Thrush Neck: Supple, symmetrical, no adenopathy, thyroid: non tender no carotid bruit and no JVD. Back: No CVA tenderness. Lungs:b/l air entry- decreased bases Heart: Regular rate and rhythm, no murmur, rub or gallop. Abdomen: Soft, distended.no tenderness Extremities: atraumatic, no cyanosis. No edema. No clubbing Edema buttock and sacrum Skin: No rashes or lesions. Or bruising Lymph: Cervical, supraclavicular normal. Neurologic: weakness upper body  Lab Results Recent Labs    01/15/21 0412 01/16/21 0438  WBC 33.2* 32.8*  HGB 8.6* 9.1*  HCT 27.7* 29.5*  NA 134*  --   K 4.1  --   CL 93*  --   CO2 34*  --   BUN 19  --   CREATININE 0.63  --    Liver Panel Recent Labs    01/15/21 0412  PROT 6.6  ALBUMIN <1.5*  AST 126*  ALT 29  ALKPHOS 272*  BILITOT 6.9*   Sedimentation Rate No results for input(s): ESRSEDRATE in the last 72 hours. C-Reactive Protein No results for input(s): CRP in the last 72 hours.  Microbiology: 01/09/2021 urine culture 30,000 colonies of mixed species 01/06/2021 abdominal fluid culture no growth  01/04/2021 blood cultures no growth  10-22 blood culture no growth 01/04/2021 catheter tip no growth Studies/Results: No results found.   Assessment/Plan:   Alcoholic hepatitis with ascites S/p paracentesis X 3 No SPB   Leucocytosis - remains high - no sepsis currently ...was treated like aspiration pneumonia, UTI ( by urine examination- she was clinically asymptomatic) , with IV antibiotics but no change in leucocytosis- as currently no evidence of infection Dced all antibiotics and is observing No cdiff clinically and also diarrhea resolved once lactulose was discontinued Will recommend paracentesis and send for cell count, protein and culture Remove central line if paracentesis shows no SBP May repeat CXR    Acute hepatic  encephalopathy- resolved   DM- poorly controlled   Fascioscapulo humeral muscular dystrophy   Pt on budesonide oral capsule 9mg  /day- microscopic colitis Discussed the management with care team

## 2021-01-16 NOTE — Evaluation (Signed)
Occupational Therapy Re-Evaluation Patient Details Name: [name] MRN: [id number] DOB: Jun 03, 1975 Today's Date: 01/16/2021   History of Present Illness 45 y.o. female with medical history significant for Bipolar disorder, diabetes, alcohol use disorder and polysubstance abuse, FSH muscular dystrophy, with history of frequent falls, last seen by neurology in January 2020, who was brought in by EMS after she fell at home 2 days prior and was unable to get up.   Clinical Impression   Pt seen for re-evaluation and tx for OT this date. Pt received in recliner, requesting to promptly return to bed. Pt attempted lateral scoots requiring MAX-TOTAL A + chux pad but ultimately unable to clear buttocks enough to scoot. Pt required MAX-TOTAL A +2 from OT and nurse tech to perform and limited BLE effort noted with pt endorsing fatigue. HR up to 120's with exertion. Mod indep once seated EOB to return to supine + time to recover. Pt continues to benefit from skilled OT services. Was able to improve tolerance for sitting in recliner this date but continues to require therapy in order to maximize safety. OT Goals reviewed and updated based on progress.      Recommendations for follow up therapy are one component of a multi-disciplinary discharge planning process, led by the attending physician.  Recommendations may be updated based on patient status, additional functional criteria and insurance authorization.   Follow Up Recommendations  Skilled nursing-short term rehab (<3 hours/day)    Assistance Recommended at Discharge    Functional Status Assessment  Patient has had a recent decline in their functional status and demonstrates the ability to make significant improvements in function in a reasonable and predictable amount of time.  Equipment Recommendations  BSC    Recommendations for Other Services       Precautions / Restrictions Precautions Precautions: Fall Restrictions Weight Bearing  Restrictions: No      Mobility Bed Mobility Overal bed mobility: Modified Independent Bed Mobility: Sit to Supine      Sit to supine: Modified independent (Device/Increase time)       Transfers Overall transfer level: Needs assistance Equipment used: None Transfers: Lateral/Scoot Transfers            Lateral/Scoot Transfers: Max assist;+2 physical assistance General transfer comment: unable to clear buttocks enough to laterally scoot with MAX A +1 and use of chux pad. Ultimately required MAX A +2 to perform recliner >EOB. Very fatigued afterwards      Balance Overall balance assessment: Needs assistance Sitting-balance support: No upper extremity supported;Single extremity supported;Feet supported Sitting balance-Leahy Scale: Fair                                     ADL either performed or assessed with clinical judgement   ADL                                               Vision         Perception     Praxis      Pertinent Vitals/Pain Pain Assessment: No/denies pain Pain Location: pt complains of pain in tailbone with sitting in recliner too long, does not provide a number and endorses improvement once returned to bed     Hand Dominance     Extremity/Trunk Assessment  Communication     Cognition Arousal/Alertness: Awake/alert Behavior During Therapy: WFL for tasks assessed/performed Overall Cognitive Status: Within Functional Limits for tasks assessed                                       General Comments  HR up to 120's with exertion    Exercises    Shoulder Instructions      Home Living                                          Prior Functioning/Environment                          OT Problem List: Decreased strength;Decreased activity tolerance;Impaired balance (sitting and/or standing);Decreased safety awareness;Decreased knowledge of use of  DME or AE;Pain      OT Treatment/Interventions: Self-care/ADL training;Therapeutic exercise;Therapeutic activities;Energy conservation;DME and/or AE instruction;Patient/family education;Balance training;Manual therapy    OT Goals(Current goals can be found in the care plan section) Acute Rehab OT Goals Patient Stated Goal: get stronger OT Goal Formulation: With patient Time For Goal Achievement: 01/30/21 Potential to Achieve Goals: Good  OT Frequency: Min 2X/week   Barriers to D/C: Decreased caregiver support;Inaccessible home environment  2nd fl apt       Co-evaluation              AM-PAC OT "6 Clicks" Daily Activity     Outcome Measure Help from another person eating meals?: None Help from another person taking care of personal grooming?: A Little Help from another person toileting, which includes using toliet, bedpan, or urinal?: A Lot Help from another person bathing (including washing, rinsing, drying)?: A Lot Help from another person to put on and taking off regular upper body clothing?: A Little Help from another person to put on and taking off regular lower body clothing?: A Lot 6 Click Score: 16   End of Session Equipment Utilized During Treatment: Oxygen  Activity Tolerance: Patient tolerated treatment well Patient left: in bed;with call bell/phone within reach;with bed alarm set;with family/visitor present  OT Visit Diagnosis: Unsteadiness on feet (R26.81);Muscle weakness (generalized) (M62.81);History of falling (Z91.81)                Time: 3785-8850 OT Time Calculation (min): 16 min Charges:  OT General Charges $OT Visit: 1 Visit OT Evaluation $OT Re-eval: 1 Re-eval OT Treatments $Self Care/Home Management : 8-22 mins  Arman Filter., MPH, MS, OTR/L ascom [phone] 01/16/21, 3:41 PM

## 2021-01-16 NOTE — Progress Notes (Signed)
Physical Therapy Treatment Patient Details Name: [name] MRN: [id number] DOB: 01/24/1976 Today's Date: 01/16/2021   History of Present Illness 45 y.o. female with medical history significant for Bipolar disorder, diabetes, alcohol use disorder and polysubstance abuse, FSH muscular dystrophy, with history of frequent falls, last seen by neurology in January 2020, who was brought in by EMS after she fell at home 2 days prior and was unable to get up.    PT Comments    Pt is making gradual progress towards goals with ability to perform lateral transfer to recliner. Encouraged to sit in recliner for goal of 2 hours this date. Also performed sit<>Stand x 2 reps, however quick fatigue present. Bed linen changed once up in recliner as bed soiled. All needs met. Plan for OT to assist with return back to bed. Will continue to progress, pt more motivated this session.   Recommendations for follow up therapy are one component of a multi-disciplinary discharge planning process, led by the attending physician.  Recommendations may be updated based on patient status, additional functional criteria and insurance authorization.  Follow Up Recommendations  Skilled nursing-short term rehab (<3 hours/day)     Assistance Recommended at Discharge Frequent or constant Supervision/Assistance  Equipment Recommendations   (TBD)    Recommendations for Other Services       Precautions / Restrictions Precautions Precautions: Fall Restrictions Weight Bearing Restrictions: No     Mobility  Bed Mobility Overal bed mobility: Modified Independent Bed Mobility: Supine to Sit     Supine to sit: Modified independent (Device/Increase time);HOB elevated     General bed mobility comments: improved technique with no verbal cues required    Transfers Overall transfer level: Needs assistance Equipment used: None Transfers: Lateral/Scoot Transfers            Lateral/Scoot Transfers: Max  assist General transfer comment: pt completes lateral scoot transfer over to recliner with frequent cues and small scoots. Drop arm on recliner utilized. +2 for bracing of chair. Use of chux pad for transfer. O2 sats at 97% and HR at 128bpm post exertion.    Ambulation/Gait                 Stairs             Wheelchair Mobility    Modified Rankin (Stroke Patients Only)       Balance Overall balance assessment: Needs assistance Sitting-balance support: Feet supported;Bilateral upper extremity supported Sitting balance-Leahy Scale: Good                                      Cognition Arousal/Alertness: Awake/alert Behavior During Therapy: WFL for tasks assessed/performed Overall Cognitive Status: Within Functional Limits for tasks assessed                                          Exercises Other Exercises Other Exercises: 2 reps of sit<>Stand with +2 max assist and bed elevated. Able to stand for ~ 5 sec first rep and 30 seconds 2nd rep. Heavy use of B UE    General Comments        Pertinent Vitals/Pain Pain Assessment: No/denies pain    Home Living  Prior Function            PT Goals (current goals can now be found in the care plan section) Acute Rehab PT Goals Patient Stated Goal: to get stronger and stand again PT Goal Formulation: With patient Time For Goal Achievement: 01/18/21 Potential to Achieve Goals: Fair Progress towards PT goals: Progressing toward goals    Frequency    Min 2X/week      PT Plan Current plan remains appropriate    Co-evaluation              AM-PAC PT "6 Clicks" Mobility   Outcome Measure  Help needed turning from your back to your side while in a flat bed without using bedrails?: A Little Help needed moving from lying on your back to sitting on the side of a flat bed without using bedrails?: A Little Help needed moving to and from a bed  to a chair (including a wheelchair)?: A Lot Help needed standing up from a chair using your arms (e.g., wheelchair or bedside chair)?: A Lot Help needed to walk in hospital room?: Total Help needed climbing 3-5 steps with a railing? : Total 6 Click Score: 12    End of Session Equipment Utilized During Treatment: Gait belt;Oxygen Activity Tolerance: Patient tolerated treatment well;Patient limited by fatigue Patient left: in chair Nurse Communication: Mobility status PT Visit Diagnosis: Other abnormalities of gait and mobility (R26.89);Muscle weakness (generalized) (M62.81);History of falling (Z91.81);Unsteadiness on feet (R26.81);Difficulty in walking, not elsewhere classified (R26.2)     Time: 1130-1200 PT Time Calculation (min) (ACUTE ONLY): 30 min  Charges:  $Therapeutic Activity: 23-37 mins                     Greggory Stallion, PT, DPT [phone]    Adonis Ryther 01/16/2021, 12:27 PM

## 2021-01-16 NOTE — Progress Notes (Addendum)
Progress Note    [name]  [id number] DOB: 1975/08/12  DOA: 12/17/2020 PCP: Janine Limbo, PA-C      Brief Narrative:    Medical records reviewed and are as summarized below:  [name] is a 45 y.o. female with medical history significant for bipolar disorder, type 2 diabetes mellitus, alcohol use disorder, polysubstance use disorder, FSHD muscular dystrophy, history of falls, microscopic colitis on budesonide, who was brought to the hospital because of a fall and inability to get up on her own.  She was found to have sepsis secondary to aspiration pneumonia, severe hyponatremia with sodium level of 113, hypokalemia with potassium of 2.9, hyperbilirubinemia with bilirubin of 11.9 and elevated ammonia of 52.  She was treated with empiric IV antibiotics and IV fluids.  Magnesium, phosphorus and potassium were repleted.  She was placed on 2 L/min oxygen for acute hypoxemic respiratory failure.  She was given lactulose and rifaximin for hepatic encephalopathy.  She underwent paracentesis for ascites.  After completing antibiotic therapy (Unasyn and Zyvox), she continued to have persistent leukocytosis and she was found to have UTI.  ID was consulted to assist with management.    Assessment/Plan:   Principal Problem:   Alcohol use disorder, moderate, dependence ([name]) Active Problems:   Alcohol withdrawal ([name])   Bipolar 2 disorder ([name])   Hypokalemia   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[place])   Acute hyponatremia   Hepatic steatosis   Polysubstance abuse ([place])   Hyperglycemia due to type 2 diabetes mellitus ([name])   Hyperbilirubinemia   Lactic acidosis   Alcoholic liver disease ([name])   Sepsis ([name])   Acute hepatic encephalopathy   Pressure injury of skin   Aspiration pneumonia of both lower lobes due to gastric secretions ([name])   Nutrition Problem: Inadequate oral intake Etiology: acute illness  Signs/Symptoms: per patient/family report   Body  mass index is 36.98 kg/m.  (Morbid obesity)  Sepsis secondary to aspiration pneumonia, acute UTI, persistent leukocytosis: Completed IV cefepime on 01/11/2021.  WBC still elevated.  Discussed with ID, Dr. Steva Ready.  She was concerned her central line may be contributing to persistent leukocytosis.  However, patient would not have any IV access after central line is removed. Unfortunately, IV team said they cannot place a peripheral IV, PICC line or midline catheter. Plan to repeat paracentesis with white cell count and ascitic fluid culture to exclude SBP before removing central line.  Alcoholic liver cirrhosis with ascites, liver failure, acute hepatic encephalopathy, hypoalbuminemia, associated severe pulmonary hypertension: S/p paracentesis x3 on this admission.  Most recent one was on 01/12/2021.  Increase Lasix from 40 mg daily to 40 mg twice daily.  Continue Aldactone and rifaximin.  She may need another paracentesis in the next 1 to 2 days.  Alcohol use disorder, polysubstance use disorder: Continue thiamine and multivitamins.  Bipolar disorder: Continue psychotropics.  Follow-up with psychiatrist.  Type II DM with hyperglycemia: Continue insulin glargine at 25 units twice daily and NovoLog at 8 units 3 times daily with meals.    Hyponatremia, hypokalemia, hypomagnesemia and hypokalemia phosphatemia: Replete magnesium with IV magnesium sulfate and start daily oral magnesium oxide.  Anemia of chronic disease: H&H is stable.  History of microscopic colitis: She says she has been on budesonide since spring of 2021.  Received COVID-19 booster vaccine and influenza vaccine on 01/16/2021   Diet Order             Diet Carb Modified Fluid consistency: Thin; Room  service appropriate? Yes  Diet effective now                      Consultants: Infectious disease, psychiatry, gastroenterology, nephrology  Procedures: Paracentesis on 12/30/2020 , 01/06/2021,  01/12/2021 Right-sided tunneled central catheter on 12/30/2020    Medications:    budesonide  9 mg Oral Daily   feeding supplement  237 mL Oral TID BM   FLUoxetine  80 mg Oral Daily   fluticasone  1 spray Each Nare Daily   folic acid  1 mg Oral Daily   furosemide  40 mg Oral BID   insulin aspart  0-20 Units Subcutaneous TID WC   insulin aspart  0-5 Units Subcutaneous QHS   insulin aspart  8 Units Subcutaneous TID WC   insulin glargine-yfgn  25 Units Subcutaneous BID   magnesium oxide  400 mg Oral Daily   multivitamin with minerals  1 tablet Oral Daily   OLANZapine  5 mg Oral QHS   pantoprazole  40 mg Oral BID   pregabalin  75 mg Oral TID   rifaximin  550 mg Oral BID   spironolactone  100 mg Oral Daily   thiamine  100 mg Oral Daily   Vitamin D (Ergocalciferol)  50,000 Units Oral Q7 days   Continuous Infusions:  sodium chloride 10 mL/hr at 01/15/21 1246     Anti-infectives (From admission, onward)    Start     Dose/Rate Route Frequency Ordered Stop   01/10/21 1400  rifaximin (XIFAXAN) tablet 550 mg        550 mg Oral 2 times daily 01/10/21 1302     01/09/21 1800  ceFEPIme (MAXIPIME) 2 g in sodium chloride 0.9 % 100 mL IVPB  Status:  Discontinued        2 g 200 mL/hr over 30 Minutes Intravenous Every 12 hours 01/09/21 1533 01/12/21 0855   01/07/21 1800  vancomycin (VANCOCIN) capsule 125 mg  Status:  Discontinued        125 mg Oral 4 times daily 01/07/21 1511 01/09/21 1924   01/06/21 1400  vancomycin (VANCOREADY) IVPB 2000 mg/400 mL  Status:  Discontinued        2,000 mg 200 mL/hr over 120 Minutes Intravenous Every 24 hours 01/05/21 1400 01/06/21 1114   01/05/21 1415  vancomycin (VANCOCIN) 2,000 mg in sodium chloride 0.9 % 500 mL IVPB  Status:  Discontinued        2,000 mg 250 mL/hr over 120 Minutes Intravenous  Once 01/05/21 1315 01/05/21 1317   01/05/21 1415  vancomycin (VANCOREADY) IVPB 2000 mg/400 mL        2,000 mg 200 mL/hr over 120 Minutes Intravenous  Once  01/05/21 1318 01/05/21 1803   01/04/21 1415  Ampicillin-Sulbactam (UNASYN) 3 g in sodium chloride 0.9 % 100 mL IVPB  Status:  Discontinued        3 g 200 mL/hr over 30 Minutes Intravenous Every 6 hours 01/04/21 1317 01/07/21 1031   12/22/20 2200  linezolid (ZYVOX) tablet 600 mg  Status:  Discontinued        600 mg Oral Every 12 hours 12/22/20 1143 12/27/20 1213   12/20/20 2200  vancomycin (VANCOCIN) IVPB 1000 mg/200 mL premix  Status:  Discontinued        1,000 mg 200 mL/hr over 60 Minutes Intravenous Every 12 hours 12/20/20 0839 12/22/20 1143   12/20/20 0930  vancomycin (VANCOCIN) 2,000 mg in sodium chloride 0.9 % 500 mL IVPB  Status:  Discontinued        2,000 mg 250 mL/hr over 120 Minutes Intravenous  Once 12/20/20 0839 12/20/20 0907   12/20/20 0930  vancomycin (VANCOCIN) 2,000 mg in sodium chloride 0.9 % 500 mL IVPB        2,000 mg 250 mL/hr over 120 Minutes Intravenous  Once 12/20/20 0907 12/20/20 1223   12/19/20 1630  rifaximin (XIFAXAN) tablet 550 mg  Status:  Discontinued        550 mg Oral 2 times daily 12/19/20 1533 01/07/21 1511   12/18/20 1200  Ampicillin-Sulbactam (UNASYN) 3 g in sodium chloride 0.9 % 100 mL IVPB  Status:  Discontinued        3 g 200 mL/hr over 30 Minutes Intravenous Every 6 hours 12/18/20 1038 12/20/20 0839   12/18/20 0045  Ampicillin-Sulbactam (UNASYN) 3 g in sodium chloride 0.9 % 100 mL IVPB        3 g 200 mL/hr over 30 Minutes Intravenous  Once 12/18/20 0033 12/18/20 0303              Family Communication/Anticipated D/C date and plan/Code Status   DVT prophylaxis:      Code Status: Full Code  Family Communication: None Disposition Plan: Awaiting placement to SNF    Status is: Inpatient  Remains inpatient appropriate because: Awaiting placement to rehab   Received        Subjective:   She feels a little short of breath without oxygen.  She still has abdominal distention.  No vomiting, abdominal pain or diarrhea.  Her mother  and stepfather were at the bedside.  Objective:    Vitals:   01/15/21 1720 01/15/21 1936 01/16/21 0404 01/16/21 0800  BP: 114/82 123/74 108/69 112/70  Pulse: 99 99 (!) 101 (!) 110  Resp: 18 16 16 17   Temp: 97.8 F (36.6 C) 98.7 F (37.1 C) 98.1 F (36.7 C) 98.1 F (36.7 C)  TempSrc: Oral Oral Oral Oral  SpO2: 93% 96% 94% 94%  Weight:      Height:       No data found.   Intake/Output Summary (Last 24 hours) at 01/16/2021 1345 Last data filed at 01/16/2021 1021 Gross per 24 hour  Intake 480 ml  Output 1800 ml  Net -1320 ml   Filed Weights   01/10/21 0500 01/11/21 0500 01/12/21 0353  Weight: 108.5 kg 108.4 kg 107.1 kg    Exam:  GEN: NAD SKIN: No rash EYES: icteric ENT: MMM CV: RRR PULM: CTA B ABD: soft, distended, NT, +BS CNS: AAO x 3, non focal EXT: No edema or tenderness            Data Reviewed:   I have personally reviewed following labs and imaging studies:  Labs: Labs show the following:   Basic Metabolic Panel: Recent Labs  Lab 01/10/21 0518 01/11/21 0600 01/12/21 0800 01/15/21 0412 01/16/21 0438  NA 133* 133* 133* 134*  --   K 3.8 3.9 4.4 4.1  --   CL 91* 92* 94* 93*  --   CO2 35* 35* 32 34*  --   GLUCOSE 264* 247* 325* 181*  --   BUN 20 18 17 19   --   CREATININE 0.55 0.69 0.62 0.63  --   CALCIUM 8.4* 8.1* 8.6* 8.4*  --   MG 1.6* 2.0 1.7 1.5* 1.5*  PHOS  --  3.5  --   --   --    GFR Estimated Creatinine Clearance: 113.1 mL/min (by C-G formula based  on SCr of 0.63 mg/dL). Liver Function Tests: Recent Labs  Lab 01/10/21 0518 01/11/21 0600 01/15/21 0412  AST 139* 128* 126*  ALT 29 28 29   ALKPHOS 239* 232* 272*  BILITOT 9.3* 8.3* 6.9*  PROT 6.4* 6.5 6.6  ALBUMIN <1.5* <1.5* <1.5*   No results for input(s): LIPASE, AMYLASE in the last 168 hours. Recent Labs  Lab 01/10/21 1000  AMMONIA 43*   Coagulation profile No results for input(s): INR, PROTIME in the last 168 hours.  CBC: Recent Labs  Lab 01/10/21 0518  01/11/21 0600 01/13/21 0545 01/15/21 0412 01/16/21 0438  WBC 29.7* 28.4* 28.4* 33.2* 32.8*  NEUTROABS 24.7* 23.2* 23.4* 27.4* 27.2*  HGB 8.4* 8.2* 8.5* 8.6* 9.1*  HCT 26.9* 26.7* 26.9* 27.7* 29.5*  MCV 115.5* 114.1* 112.1* 109.9* 112.2*  PLT 305 289 273 267 255   Cardiac Enzymes: No results for input(s): CKTOTAL, CKMB, CKMBINDEX, TROPONINI in the last 168 hours. BNP (last 3 results) No results for input(s): PROBNP in the last 8760 hours. CBG: Recent Labs  Lab 01/15/21 1222 01/15/21 1718 01/15/21 2050 01/16/21 0802 01/16/21 1154  GLUCAP 395* 278* 207* 200* 232*   D-Dimer: No results for input(s): DDIMER in the last 72 hours. Hgb A1c: No results for input(s): HGBA1C in the last 72 hours. Lipid Profile: No results for input(s): CHOL, HDL, LDLCALC, TRIG, CHOLHDL, LDLDIRECT in the last 72 hours. Thyroid function studies: No results for input(s): TSH, T4TOTAL, T3FREE, THYROIDAB in the last 72 hours.  Invalid input(s): FREET3 Anemia work up: No results for input(s): VITAMINB12, FOLATE, FERRITIN, TIBC, IRON, RETICCTPCT in the last 72 hours. Sepsis Labs: Recent Labs  Lab 01/11/21 0600 01/13/21 0545 01/15/21 0412 01/16/21 0438  WBC 28.4* 28.4* 33.2* 32.8*    Microbiology Recent Results (from the past 240 hour(s))  Urine Culture     Status: Abnormal   Collection Time: 01/09/21  5:30 AM   Specimen: Urine, Clean Catch  Result Value Ref Range Status   Specimen Description   Final    URINE, CLEAN CATCH Performed at [facility], [address], [place], [place]    Special Requests   Final    NONE Performed at [facility], [address] [place], [place]    Culture (A)  Final    30,000 COLONIES/mL MULTIPLE SPECIES PRESENT, SUGGEST RECOLLECTION   Report Status 01/10/2021 FINAL  Final    Procedures and diagnostic studies:  No results found.             LOS: 29 days   Serenah Mill  [place] 01/12/2021  on [email or web address]. If 7PM-7AM, please contact night-coverage at [email or web address]     01/16/2021, 1:45 PM

## 2021-01-16 NOTE — Progress Notes (Addendum)
Inpatient Diabetes Program Recommendations  AACE/ADA: New Consensus Statement on Inpatient Glycemic Control (2015)  Target Ranges:  Prepandial:   less than 140 mg/dL      Peak postprandial:   less than 180 mg/dL (1-2 hours)      Critically ill patients:  140 - 180 mg/dL  Results for BRYAH, OCHELTREE (MRN [id number]) as of 01/16/2021 09:16  Ref. Range 01/15/2021 07:42 01/15/2021 12:22 01/15/2021 17:18 01/15/2021 20:50  Glucose-Capillary Latest Ref Range: 70 - 99 mg/dL 326 (H)  15 units Novolog  25 units Semglee  395 (H)  28 units Novolog  278 (H)  19 units Novolog  207 (H)  2 units Novolog  25 units Semglee   Results for ANGELLY, SPEARING (MRN [id number]) as of 01/16/2021 09:16  Ref. Range 01/16/2021 08:02  Glucose-Capillary Latest Ref Range: 70 - 99 mg/dL 833 (H)  12 units Novolog  25 units Semglee   Home DM Meds: Janumet XR 100/1000 mg Daily                             Metformin 500 mg BID                             Ozempic Qweek     Current Orders: Semglee 25 units BID  Novolog 0-20 units TID ac/hs  Novolog 8 units TID with meals   MD- Please consider:  1. Increase Semglee to 27 units BID  2. Increase Novolog Meal Coverage to 12 units TID with meals    --Will follow patient during hospitalization--  Ambrose Finland RN, MSN, CDE Diabetes Coordinator Inpatient Glycemic Control Team Team Pager: [phone] (8a-5p)

## 2021-01-16 NOTE — TOC Progression Note (Signed)
Transition of Care [facility]) - Progression Note    Patient Details  Name: [name] MRN: [id number] Date of Birth: 08-06-1975  [facility] CM/SW Contact  Margarito Liner, LCSW Phone Number: 01/16/2021, 3:26 PM  Clinical Narrative:  Patient has a bed offer from [facility] in [place]. Patient is aware and will notify her mother.   Expected Discharge Plan: Skilled Nursing Facility Barriers to Discharge: Continued Medical Work up  Expected Discharge Plan and Services Expected Discharge Plan: Skilled Nursing Facility     Post Acute Care Choice: Skilled Nursing Facility Living arrangements for the past 2 months: Single Family Home                                       Social Determinants of Health (SDOH) Interventions    Readmission Risk Interventions No flowsheet data found.

## 2021-01-17 ENCOUNTER — Encounter: Payer: Self-pay | Admitting: Internal Medicine

## 2021-01-17 ENCOUNTER — Inpatient Hospital Stay: Payer: Medicare Other

## 2021-01-17 DIAGNOSIS — J181 Lobar pneumonia, unspecified organism: Secondary | ICD-10-CM

## 2021-01-17 DIAGNOSIS — K709 Alcoholic liver disease, unspecified: Secondary | ICD-10-CM | POA: Diagnosis not present

## 2021-01-17 DIAGNOSIS — F1020 Alcohol dependence, uncomplicated: Secondary | ICD-10-CM | POA: Diagnosis not present

## 2021-01-17 DIAGNOSIS — J690 Pneumonitis due to inhalation of food and vomit: Secondary | ICD-10-CM | POA: Diagnosis not present

## 2021-01-17 LAB — CBC WITH DIFFERENTIAL/PLATELET
Abs Immature Granulocytes: 0.8 10*3/uL — ABNORMAL HIGH (ref 0.00–0.07)
Basophils Absolute: 0.2 10*3/uL — ABNORMAL HIGH (ref 0.0–0.1)
Basophils Relative: 1 %
Eosinophils Absolute: 0.4 10*3/uL (ref 0.0–0.5)
Eosinophils Relative: 1 %
HCT: 29.3 % — ABNORMAL LOW (ref 36.0–46.0)
Hemoglobin: 9.4 g/dL — ABNORMAL LOW (ref 12.0–15.0)
Immature Granulocytes: 2 %
Lymphocytes Relative: 9 %
Lymphs Abs: 3.2 10*3/uL (ref 0.7–4.0)
MCH: 35.9 pg — ABNORMAL HIGH (ref 26.0–34.0)
MCHC: 32.1 g/dL (ref 30.0–36.0)
MCV: 111.8 fL — ABNORMAL HIGH (ref 80.0–100.0)
Monocytes Absolute: 1 10*3/uL (ref 0.1–1.0)
Monocytes Relative: 3 %
Neutro Abs: 29.7 10*3/uL — ABNORMAL HIGH (ref 1.7–7.7)
Neutrophils Relative %: 84 %
Platelets: 251 10*3/uL (ref 150–400)
RBC: 2.62 MIL/uL — ABNORMAL LOW (ref 3.87–5.11)
RDW: 17.1 % — ABNORMAL HIGH (ref 11.5–15.5)
Smear Review: NORMAL
WBC: 35.2 10*3/uL — ABNORMAL HIGH (ref 4.0–10.5)
nRBC: 0 % (ref 0.0–0.2)

## 2021-01-17 LAB — BASIC METABOLIC PANEL
Anion gap: 8 (ref 5–15)
BUN: 22 mg/dL — ABNORMAL HIGH (ref 6–20)
CO2: 34 mmol/L — ABNORMAL HIGH (ref 22–32)
Calcium: 8.3 mg/dL — ABNORMAL LOW (ref 8.9–10.3)
Chloride: 93 mmol/L — ABNORMAL LOW (ref 98–111)
Creatinine, Ser: 0.65 mg/dL (ref 0.44–1.00)
GFR, Estimated: >60 mL/min (ref >60)
Glucose, Bld: 125 mg/dL — ABNORMAL HIGH (ref 70–99)
Potassium: 4.1 mmol/L (ref 3.5–5.1)
Sodium: 135 mmol/L (ref 135–145)

## 2021-01-17 LAB — BODY FLUID CELL COUNT WITH DIFFERENTIAL
Eos, Fluid: 0 %
Lymphs, Fluid: 40 %
Monocyte-Macrophage-Serous Fluid: 35 %
Neutrophil Count, Fluid: 25 %
Total Nucleated Cell Count, Fluid: 152 cu mm

## 2021-01-17 LAB — GLUCOSE, CAPILLARY
Glucose-Capillary: 113 mg/dL — ABNORMAL HIGH (ref 70–99)
Glucose-Capillary: 185 mg/dL — ABNORMAL HIGH (ref 70–99)
Glucose-Capillary: 231 mg/dL — ABNORMAL HIGH (ref 70–99)

## 2021-01-17 LAB — MAGNESIUM: Magnesium: 1.6 mg/dL — ABNORMAL LOW (ref 1.7–2.4)

## 2021-01-17 LAB — PROCALCITONIN: Procalcitonin: 2.24 ng/mL

## 2021-01-17 LAB — PROTEIN, PLEURAL OR PERITONEAL FLUID: Total protein, fluid: <3 g/dL

## 2021-01-17 LAB — MRSA NEXT GEN BY PCR, NASAL: MRSA by PCR Next Gen: NOT DETECTED

## 2021-01-17 LAB — PATHOLOGIST SMEAR REVIEW

## 2021-01-17 MED ORDER — INSULIN GLARGINE-YFGN 100 UNIT/ML ~~LOC~~ SOLN
28.0000 [IU] | Freq: Two times a day (BID) | SUBCUTANEOUS | Status: DC
  Administered 2021-01-17 – 2021-01-26 (×19): 28 [IU] via SUBCUTANEOUS
  Filled 2021-01-17 (×21): qty 0.28

## 2021-01-17 MED ORDER — INSULIN ASPART 100 UNIT/ML IJ SOLN
10.0000 [IU] | Freq: Three times a day (TID) | INTRAMUSCULAR | Status: DC
  Administered 2021-01-17 – 2021-01-18 (×4): 10 [IU] via SUBCUTANEOUS
  Filled 2021-01-17: qty 1

## 2021-01-17 MED ORDER — MAGNESIUM SULFATE 2 GM/50ML IV SOLN
2.0000 g | Freq: Once | INTRAVENOUS | Status: AC
  Administered 2021-01-17: 2 g via INTRAVENOUS
  Filled 2021-01-17: qty 50

## 2021-01-17 MED ORDER — PIPERACILLIN-TAZOBACTAM 3.375 G IVPB
3.3750 g | Freq: Three times a day (TID) | INTRAVENOUS | Status: AC
  Administered 2021-01-17 – 2021-01-19 (×7): 3.375 g via INTRAVENOUS
  Filled 2021-01-17 (×7): qty 50

## 2021-01-17 MED ORDER — MAGNESIUM OXIDE -MG SUPPLEMENT 400 (240 MG) MG PO TABS
400.0000 mg | ORAL_TABLET | Freq: Two times a day (BID) | ORAL | Status: DC
  Administered 2021-01-17 – 2021-01-26 (×18): 400 mg via ORAL
  Filled 2021-01-17 (×18): qty 1

## 2021-01-17 NOTE — Progress Notes (Signed)
PT Cancellation Note  Patient Details Name: [name] MRN: [id number] DOB: [birth year]   Cancelled Treatment:    Reason Eval/Treat Not Completed: Patient at procedure or test/unavailable  Pt off the floor for procedure when attempted to see patient. Will re-attempt at later time as time permits.   Verl Blalock, SPT  Verl Blalock 01/17/2021, 11:28 AM

## 2021-01-17 NOTE — Consult Note (Signed)
Pharmacy Antibiotic Note  [name] is a 45 y.o. female admitted on 12/17/2020 with  aspiration pneumonia .  Pharmacy has been consulted for Zosyn dosing.  Plan: Zosyn 3.375g IV q8h (4 hour infusion).  Height: 5\' 7"  (170.2 cm) Weight: 107.1 kg (236 lb 1.8 oz) IBW/kg (Calculated) : 61.6  Temp (24hrs), Avg:98.9 F (37.2 C), Min:98.2 F (36.8 C), Max:100.2 F (37.9 C)  Recent Labs  Lab 01/11/21 0600 01/12/21 0800 01/13/21 0545 01/15/21 0412 01/16/21 0438 01/17/21 0606  WBC 28.4*  --  28.4* 33.2* 32.8* 35.2*  CREATININE 0.69 0.62  --  0.63  --  0.65    Estimated Creatinine Clearance: 113.1 mL/min (by C-G formula based on SCr of 0.65 mg/dL).    Allergies  Allergen Reactions   Erythromycin Anaphylaxis   Sulfa Antibiotics Anaphylaxis   Tylenol [Acetaminophen] Other (See Comments)    Pancreatitis and liver dysfunction, not supposed to take med   Nsaids Other (See Comments)    Causes GI problems, very bad reaction-per patient.    Gabapentin     Mood changes     Antimicrobials this admission: Zosyn 11/1 >>    Microbiology results: 10/24 UCx: 30k mult species  11/1 MRSA PCR: pending  Thank you for allowing pharmacy to be a part of this patient's care.  13/1 A [name] 01/17/2021 3:45 PM

## 2021-01-17 NOTE — Progress Notes (Signed)
Progress Note    [name]  [id number] DOB: [birth year]  DOA: 12/17/2020 PCP: Janine Limbo, PA-C      Brief Narrative:    Medical records reviewed and are as summarized below:  [name] is a 45 y.o. female with medical history significant for bipolar disorder, type 2 diabetes mellitus, alcohol use disorder, polysubstance use disorder, FSHD muscular dystrophy, history of falls, microscopic colitis on budesonide, who was brought to the hospital because of a fall and inability to get up on her own.  She was found to have sepsis secondary to aspiration pneumonia, severe hyponatremia with sodium level of 113, hypokalemia with potassium of 2.9, hyperbilirubinemia with bilirubin of 11.9 and elevated ammonia of 52.  She was treated with empiric IV antibiotics and IV fluids.  Magnesium, phosphorus and potassium were repleted.  She was placed on 2 L/min oxygen for acute hypoxemic respiratory failure.  She remains on 2 L/min oxygen for chronic hypoxemic respiratory failure.  She was given lactulose and rifaximin for hepatic encephalopathy.  She underwent paracentesis for ascites.  There was no evidence of SBP.  After completing antibiotic therapy (Unasyn and Zyvox), she continued to have persistent leukocytosis and she was found to have UTI.  ID was consulted to assist with management.    Assessment/Plan:   Principal Problem:   Alcohol use disorder, moderate, dependence ([name]) Active Problems:   Alcohol withdrawal ([name])   Bipolar 2 disorder ([name])   Hypokalemia   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[place])   Acute hyponatremia   Hepatic steatosis   Polysubstance abuse ([place])   Hyperglycemia due to type 2 diabetes mellitus ([name])   Hyperbilirubinemia   Lactic acidosis   Alcoholic liver disease ([name])   Sepsis ([name])   Acute hepatic encephalopathy   Pressure injury of skin   Aspiration pneumonia of both lower lobes due to gastric secretions ([name])   Nutrition Problem:  Inadequate oral intake Etiology: acute illness  Signs/Symptoms: per patient/family report   Body mass index is 36.98 kg/m.  (Morbid obesity)  Sepsis secondary to aspiration pneumonia, acute UTI, persistent leukocytosis, low-grade fever on 01/17/2021: Completed IV cefepime on 01/11/2021.  WBC is still going up.  Repeat paracentesis done on 01/17/2021 did not show any evidence of SBP.  Chest x-ray and CT chest obtained today was nonspecific and findings could represent a combination of infection/inflammation versus atelectasis.  Procalcitonin and MRSA screen have been ordered.  Follow-up with ID specialist for further recommendations regarding antibiotics.  Alcoholic liver cirrhosis with ascites, liver failure, acute hepatic encephalopathy, hypoalbuminemia, associated severe pulmonary hypertension: S/p paracentesis x4 on this admission.  Most recent paracentesis on 01/17/2021.  Continue Lasix, Aldactone and rifaximin.   Chronic hypoxemic respiratory failure: Unable to wean her off of oxygen.  Continue 2 L/min oxygen  Bipolar disorder, depression: Continue psychotropics.  Follow-up with psychiatrist.  Type II DM with hyperglycemia: Increase insulin glargine from 25 to 28 units twice daily and increase NovoLog from 8 to 20 units 3 times daily.  Adjust insulin as needed.  Hyponatremia, hypokalemia, hypomagnesemia and hypokalemia phosphatemia: Improved.  Replete magnesium  Other comorbidities include alcohol use disorder, polysubstance use disorder, anemia of chronic disease, microscopic colitis (has been on budesonide since spring 2021)  Received COVID-19 booster vaccine and influenza vaccine on 01/16/2021   Diet Order             Diet Carb Modified Fluid consistency: Thin; Room service appropriate? Yes  Diet effective now  Consultants: Infectious disease, psychiatry, gastroenterology, nephrology  Procedures: Paracentesis on 12/30/2020 , 01/06/2021,  01/12/2021 Right-sided tunneled central catheter on 12/30/2020    Medications:    budesonide  9 mg Oral Daily   feeding supplement  237 mL Oral TID BM   FLUoxetine  80 mg Oral Daily   fluticasone  1 spray Each Nare Daily   folic acid  1 mg Oral Daily   furosemide  40 mg Oral BID   insulin aspart  0-20 Units Subcutaneous TID WC   insulin aspart  0-5 Units Subcutaneous QHS   insulin aspart  10 Units Subcutaneous TID WC   insulin glargine-yfgn  28 Units Subcutaneous BID   magnesium oxide  400 mg Oral Daily   multivitamin with minerals  1 tablet Oral Daily   OLANZapine  5 mg Oral QHS   pantoprazole  40 mg Oral BID   pregabalin  75 mg Oral TID   rifaximin  550 mg Oral BID   spironolactone  100 mg Oral Daily   thiamine  100 mg Oral Daily   Vitamin D (Ergocalciferol)  50,000 Units Oral Q7 days   Continuous Infusions:  sodium chloride 10 mL/hr at 01/15/21 1246   piperacillin-tazobactam (ZOSYN)  IV 3.375 g (01/17/21 1630)     Anti-infectives (From admission, onward)    Start     Dose/Rate Route Frequency Ordered Stop   01/17/21 1630  piperacillin-tazobactam (ZOSYN) IVPB 3.375 g        3.375 g 12.5 mL/hr over 240 Minutes Intravenous Every 8 hours 01/17/21 1544     01/10/21 1400  rifaximin (XIFAXAN) tablet 550 mg        550 mg Oral 2 times daily 01/10/21 1302     01/09/21 1800  ceFEPIme (MAXIPIME) 2 g in sodium chloride 0.9 % 100 mL IVPB  Status:  Discontinued        2 g 200 mL/hr over 30 Minutes Intravenous Every 12 hours 01/09/21 1533 01/12/21 0855   01/07/21 1800  vancomycin (VANCOCIN) capsule 125 mg  Status:  Discontinued        125 mg Oral 4 times daily 01/07/21 1511 01/09/21 1924   01/06/21 1400  vancomycin (VANCOREADY) IVPB 2000 mg/400 mL  Status:  Discontinued        2,000 mg 200 mL/hr over 120 Minutes Intravenous Every 24 hours 01/05/21 1400 01/06/21 1114   01/05/21 1415  vancomycin (VANCOCIN) 2,000 mg in sodium chloride 0.9 % 500 mL IVPB  Status:  Discontinued         2,000 mg 250 mL/hr over 120 Minutes Intravenous  Once 01/05/21 1315 01/05/21 1317   01/05/21 1415  vancomycin (VANCOREADY) IVPB 2000 mg/400 mL        2,000 mg 200 mL/hr over 120 Minutes Intravenous  Once 01/05/21 1318 01/05/21 1803   01/04/21 1415  Ampicillin-Sulbactam (UNASYN) 3 g in sodium chloride 0.9 % 100 mL IVPB  Status:  Discontinued        3 g 200 mL/hr over 30 Minutes Intravenous Every 6 hours 01/04/21 1317 01/07/21 1031   12/22/20 2200  linezolid (ZYVOX) tablet 600 mg  Status:  Discontinued        600 mg Oral Every 12 hours 12/22/20 1143 12/27/20 1213   12/20/20 2200  vancomycin (VANCOCIN) IVPB 1000 mg/200 mL premix  Status:  Discontinued        1,000 mg 200 mL/hr over 60 Minutes Intravenous Every 12 hours 12/20/20 0839 12/22/20 1143   12/20/20 0930  vancomycin (  VANCOCIN) 2,000 mg in sodium chloride 0.9 % 500 mL IVPB  Status:  Discontinued        2,000 mg 250 mL/hr over 120 Minutes Intravenous  Once 12/20/20 0839 12/20/20 0907   12/20/20 0930  vancomycin (VANCOCIN) 2,000 mg in sodium chloride 0.9 % 500 mL IVPB        2,000 mg 250 mL/hr over 120 Minutes Intravenous  Once 12/20/20 0907 12/20/20 1223   12/19/20 1630  rifaximin (XIFAXAN) tablet 550 mg  Status:  Discontinued        550 mg Oral 2 times daily 12/19/20 1533 01/07/21 1511   12/18/20 1200  Ampicillin-Sulbactam (UNASYN) 3 g in sodium chloride 0.9 % 100 mL IVPB  Status:  Discontinued        3 g 200 mL/hr over 30 Minutes Intravenous Every 6 hours 12/18/20 1038 12/20/20 0839   12/18/20 0045  Ampicillin-Sulbactam (UNASYN) 3 g in sodium chloride 0.9 % 100 mL IVPB        3 g 200 mL/hr over 30 Minutes Intravenous  Once 12/18/20 0033 12/18/20 0303              Family Communication/Anticipated D/C date and plan/Code Status   DVT prophylaxis:      Code Status: Full Code  Family Communication: None Disposition Plan: Awaiting placement to SNF    Status is: Inpatient  Remains inpatient appropriate because:  Awaiting placement to rehab   Received        Subjective:   Interval events noted.  She had low-grade fever with temperature of 100.2 F this morning.  No cough, chest pain or worsening shortness of breath.  No diarrhea, vomiting, abdominal pain or urinary symptoms.  Objective:    Vitals:   01/17/21 1124 01/17/21 1205 01/17/21 1510 01/17/21 1619  BP: 106/62 113/69 117/66 114/67  Pulse:  (!) 110 (!) 111 (!) 110  Resp:  18 18   Temp:  98.4 F (36.9 C) 98.2 F (36.8 C) 98.4 F (36.9 C)  TempSrc:  Oral Oral Oral  SpO2:  99% (!) 87% 95%  Weight:      Height:       No data found.   Intake/Output Summary (Last 24 hours) at 01/17/2021 1638 Last data filed at 01/17/2021 1611 Gross per 24 hour  Intake 240 ml  Output 3200 ml  Net -2960 ml   Filed Weights   01/10/21 0500 01/11/21 0500 01/12/21 0353  Weight: 108.5 kg 108.4 kg 107.1 kg    Exam:  GEN: NAD SKIN: Warm and dry EYES: No pallor or icterus ENT: MMM CV: RRR PULM: CTA B ABD: soft, distended, NT, +BS CNS: AAO x 3, non focal EXT: No edema or tenderness            Data Reviewed:   I have personally reviewed following labs and imaging studies:  Labs: Labs show the following:   Basic Metabolic Panel: Recent Labs  Lab 01/11/21 0600 01/12/21 0800 01/15/21 0412 01/16/21 0438 01/17/21 0606  NA 133* 133* 134*  --  135  K 3.9 4.4 4.1  --  4.1  CL 92* 94* 93*  --  93*  CO2 35* 32 34*  --  34*  GLUCOSE 247* 325* 181*  --  125*  BUN 18 17 19   --  22*  CREATININE 0.69 0.62 0.63  --  0.65  CALCIUM 8.1* 8.6* 8.4*  --  8.3*  MG 2.0 1.7 1.5* 1.5* 1.6*  PHOS 3.5  --   --   --   --  GFR Estimated Creatinine Clearance: 113.1 mL/min (by C-G formula based on SCr of 0.65 mg/dL). Liver Function Tests: Recent Labs  Lab 01/11/21 0600 01/15/21 0412  AST 128* 126*  ALT 28 29  ALKPHOS 232* 272*  BILITOT 8.3* 6.9*  PROT 6.5 6.6  ALBUMIN <1.5* <1.5*   No results for input(s): LIPASE, AMYLASE in the  last 168 hours. No results for input(s): AMMONIA in the last 168 hours.  Coagulation profile No results for input(s): INR, PROTIME in the last 168 hours.  CBC: Recent Labs  Lab 01/11/21 0600 01/13/21 0545 01/15/21 0412 01/16/21 0438 01/17/21 0606  WBC 28.4* 28.4* 33.2* 32.8* 35.2*  NEUTROABS 23.2* 23.4* 27.4* 27.2* 29.7*  HGB 8.2* 8.5* 8.6* 9.1* 9.4*  HCT 26.7* 26.9* 27.7* 29.5* 29.3*  MCV 114.1* 112.1* 109.9* 112.2* 111.8*  PLT 289 273 267 255 251   Cardiac Enzymes: No results for input(s): CKTOTAL, CKMB, CKMBINDEX, TROPONINI in the last 168 hours. BNP (last 3 results) No results for input(s): PROBNP in the last 8760 hours. CBG: Recent Labs  Lab 01/16/21 1154 01/16/21 1640 01/16/21 2135 01/17/21 0741 01/17/21 1158  GLUCAP 232* 228* 248* 113* 185*   D-Dimer: No results for input(s): DDIMER in the last 72 hours. Hgb A1c: No results for input(s): HGBA1C in the last 72 hours. Lipid Profile: No results for input(s): CHOL, HDL, LDLCALC, TRIG, CHOLHDL, LDLDIRECT in the last 72 hours. Thyroid function studies: No results for input(s): TSH, T4TOTAL, T3FREE, THYROIDAB in the last 72 hours.  Invalid input(s): FREET3 Anemia work up: No results for input(s): VITAMINB12, FOLATE, FERRITIN, TIBC, IRON, RETICCTPCT in the last 72 hours. Sepsis Labs: Recent Labs  Lab 01/13/21 0545 01/15/21 0412 01/16/21 0438 01/17/21 0606  WBC 28.4* 33.2* 32.8* 35.2*    Microbiology Recent Results (from the past 240 hour(s))  Urine Culture     Status: Abnormal   Collection Time: 01/09/21  5:30 AM   Specimen: Urine, Clean Catch  Result Value Ref Range Status   Specimen Description   Final    URINE, CLEAN CATCH Performed at [facility], [address] [place], [place] [address]    Special Requests   Final    NONE Performed at [facility], [address] [place], [place] [address]    Culture (A)  Final    30,000 COLONIES/mL MULTIPLE SPECIES PRESENT, SUGGEST  RECOLLECTION   Report Status 01/10/2021 FINAL  Final    Procedures and diagnostic studies:  CT CHEST WO CONTRAST  Result Date: 01/17/2021 CLINICAL DATA:  Pneumonia, effusion or abscess suspected, xray done persistent leukocytosis EXAM: CT CHEST WITHOUT CONTRAST TECHNIQUE: Multidetector CT imaging of the chest was performed following the standard protocol without IV contrast. COMPARISON:  CT chest 12/29/2020 FINDINGS: Lines and tubes: Right central venous catheter with tip terminating at the superior cavoatrial junction. Cardiovascular: Normal heart size. No significant pericardial effusion. The thoracic aorta is normal in caliber. Likely 2 vessel coronary artery calcifications. Mediastinum/Nodes: No enlarged mediastinal or axillary lymph nodes. Thyroid gland, trachea, and esophagus demonstrate no significant findings. Lungs/Pleura: Elevated right hemidiaphragm. Bilateral lower lobe peribronchovascular consolidations with air bronchograms. No pulmonary nodule. No pulmonary mass. Interval resolution of right pleural effusion. No pleural effusion. No pneumothorax. Upper Abdomen: No acute abnormality. Musculoskeletal: Subcutaneus soft tissue edema along the lateral right upper abdomen. No suspicious lytic or blastic osseous lesions. No acute displaced fracture. IMPRESSION: 1. Bilateral lower lobe peribronchovascular consolidations with air bronchograms. Findings could represent a combination of infection/inflammation versus atelectasis. Limited evaluation on  this noncontrast study. 2. Interval resolution of a right pleural effusion. Elevated right hemidiaphragm. 3. Subcutaneus soft tissue edema along the lateral right upper abdomen. Electronically Signed   By: Iven Finn M.D.   On: 01/17/2021 15:59   US Paracentesis  Result Date: 01/17/2021 INDICATION: Cirrhosis with recurrent ascites. Request received for diagnostic and therapeutic paracentesis. EXAM: ULTRASOUND GUIDED PARACENTESIS MEDICATIONS: Local  1% lidocaine only. COMPLICATIONS: None immediate. PROCEDURE: Informed written consent was obtained from the patient after a discussion of the risks, benefits and alternatives to treatment. A timeout was performed prior to the initiation of the procedure. Initial ultrasound scanning demonstrates a moderate amount of ascites within the right lower abdominal quadrant. The right lower abdomen was prepped and draped in the usual sterile fashion. 1% lidocaine was used for local anesthesia. Following this, a 19 gauge, 10-cm, Yueh catheter was introduced. An ultrasound image was saved for documentation purposes. The paracentesis was performed. The catheter was removed and a dressing was applied. The patient tolerated the procedure well without immediate post procedural complication. FINDINGS: A total of approximately 1800 mL of yellow-colored fluid was removed. Samples were sent to the laboratory as requested by the clinical team. IMPRESSION: Successful ultrasound-guided paracentesis yielding 1.8 liters of peritoneal fluid. Read By: Tsosie Billing PA-C Electronically Signed   By: Lucrezia Europe M.D.   On: 01/17/2021 12:39   DG Chest Port 1 View  Result Date: 01/17/2021 CLINICAL DATA:  Leukocytosis EXAM: PORTABLE CHEST 1 VIEW COMPARISON:  Previous studies including the examination of 01/05/2021 FINDINGS: Transverse diameter of heart is increased. There are linear densities in left mid and left lower lung fields with interval worsening. There is marked elevation of right hemidiaphragm. There is homogeneous opacification in the right lower lung fields with no significant interval change. Left lateral CP angle is clear. There is no pneumothorax. Tip of central venous catheter is seen in superior vena cava. IMPRESSION: Cardiomegaly. Opacification of right mid and lower lung fields has not changed significantly. This may be due to elevation of right hemidiaphragm along with atelectasis and pleural effusion. There is interval  increase in linear densities in left mid and left lower lung fields suggesting worsening of subsegmental atelectasis/pneumonitis. Electronically Signed   By: Elmer Picker M.D.   On: 01/17/2021 11:02               LOS: 30 days   Sammye Staff  [facility]   Pager on [email or web address]. If 7PM-7AM, please contact night-coverage at [email or web address]     01/17/2021, 4:38 PM

## 2021-01-17 NOTE — Procedures (Signed)
PROCEDURE SUMMARY:  Successful US guided paracentesis from RLQ.  Yielded 1.8 L of yellow fluid.  No immediate complications.  Pt tolerated well.   Specimen was sent for labs.  EBL < 49mL  Berneta Levins PA-C 01/17/2021 11:49 AM

## 2021-01-17 NOTE — TOC Progression Note (Signed)
Transition of [facility]) - Progression Note    Patient Details  Name: [name] MRN: [id number] Date of Birth: Jul 28, 1975  Transition of [facility]) CM/SW Contact  Shelbie Hutching, RN Phone Number: 01/17/2021, 2:23 PM  Clinical Narrative:    Patient had a paracentesis today and repeat chest x ray.  Patient still has central line.   RNCM met with patient at the bedside and she is excited about going to rehab and accepts bed offer from [place].      Expected Discharge Plan: [place] Barriers to Discharge: Continued Medical Work up  Expected Discharge Plan and Services Expected Discharge Plan: [place] Choice: [place] arrangements for the past 2 months: Single Family Home                                       Social Determinants of Health (SDOH) Interventions    Readmission Risk Interventions No flowsheet data found.

## 2021-01-18 DIAGNOSIS — K7011 Alcoholic hepatitis with ascites: Secondary | ICD-10-CM | POA: Diagnosis not present

## 2021-01-18 DIAGNOSIS — F1020 Alcohol dependence, uncomplicated: Secondary | ICD-10-CM | POA: Diagnosis not present

## 2021-01-18 DIAGNOSIS — D72829 Elevated white blood cell count, unspecified: Secondary | ICD-10-CM | POA: Diagnosis not present

## 2021-01-18 LAB — CBC WITH DIFFERENTIAL/PLATELET
Abs Immature Granulocytes: 0.84 10*3/uL — ABNORMAL HIGH (ref 0.00–0.07)
Basophils Absolute: 0.2 10*3/uL — ABNORMAL HIGH (ref 0.0–0.1)
Basophils Relative: 0 %
Eosinophils Absolute: 0.3 10*3/uL (ref 0.0–0.5)
Eosinophils Relative: 1 %
HCT: 28.6 % — ABNORMAL LOW (ref 36.0–46.0)
Hemoglobin: 9.1 g/dL — ABNORMAL LOW (ref 12.0–15.0)
Immature Granulocytes: 2 %
Lymphocytes Relative: 8 %
Lymphs Abs: 2.9 10*3/uL (ref 0.7–4.0)
MCH: 35.4 pg — ABNORMAL HIGH (ref 26.0–34.0)
MCHC: 31.8 g/dL (ref 30.0–36.0)
MCV: 111.3 fL — ABNORMAL HIGH (ref 80.0–100.0)
Monocytes Absolute: 1 10*3/uL (ref 0.1–1.0)
Monocytes Relative: 3 %
Neutro Abs: 31.7 10*3/uL — ABNORMAL HIGH (ref 1.7–7.7)
Neutrophils Relative %: 86 %
Platelets: 260 10*3/uL (ref 150–400)
RBC: 2.57 MIL/uL — ABNORMAL LOW (ref 3.87–5.11)
RDW: 16.6 % — ABNORMAL HIGH (ref 11.5–15.5)
Smear Review: NORMAL
WBC: 36.9 10*3/uL — ABNORMAL HIGH (ref 4.0–10.5)
nRBC: 0 % (ref 0.0–0.2)

## 2021-01-18 LAB — GLUCOSE, CAPILLARY
Glucose-Capillary: 271 mg/dL — ABNORMAL HIGH (ref 70–99)
Glucose-Capillary: 185 mg/dL — ABNORMAL HIGH (ref 70–99)
Glucose-Capillary: 253 mg/dL — ABNORMAL HIGH (ref 70–99)
Glucose-Capillary: 205 mg/dL — ABNORMAL HIGH (ref 70–99)
Glucose-Capillary: 206 mg/dL — ABNORMAL HIGH (ref 70–99)

## 2021-01-18 LAB — PROCALCITONIN: Procalcitonin: 2.06 ng/mL

## 2021-01-18 LAB — BASIC METABOLIC PANEL
Anion gap: 8 (ref 5–15)
BUN: 22 mg/dL — ABNORMAL HIGH (ref 6–20)
CO2: 35 mmol/L — ABNORMAL HIGH (ref 22–32)
Calcium: 8.3 mg/dL — ABNORMAL LOW (ref 8.9–10.3)
Chloride: 93 mmol/L — ABNORMAL LOW (ref 98–111)
Creatinine, Ser: 0.58 mg/dL (ref 0.44–1.00)
GFR, Estimated: >60 mL/min (ref >60)
Glucose, Bld: 231 mg/dL — ABNORMAL HIGH (ref 70–99)
Potassium: 3.9 mmol/L (ref 3.5–5.1)
Sodium: 136 mmol/L (ref 135–145)

## 2021-01-18 LAB — MAGNESIUM: Magnesium: 1.7 mg/dL (ref 1.7–2.4)

## 2021-01-18 LAB — PROTEIN, BODY FLUID (OTHER): Total Protein, Body Fluid Other: 2.1 g/dL

## 2021-01-18 MED ORDER — MAGNESIUM SULFATE 2 GM/50ML IV SOLN
2.0000 g | Freq: Once | INTRAVENOUS | Status: AC
  Administered 2021-01-18: 2 g via INTRAVENOUS
  Filled 2021-01-18: qty 50

## 2021-01-18 MED ORDER — ENOXAPARIN SODIUM 60 MG/0.6ML IJ SOSY
0.5000 mg/kg | PREFILLED_SYRINGE | INTRAMUSCULAR | Status: DC
  Administered 2021-01-18 – 2021-01-20 (×3): 57.5 mg via SUBCUTANEOUS
  Filled 2021-01-18 (×3): qty 0.6

## 2021-01-18 MED ORDER — INSULIN ASPART 100 UNIT/ML IJ SOLN
12.0000 [IU] | Freq: Three times a day (TID) | INTRAMUSCULAR | Status: DC
  Administered 2021-01-18 – 2021-01-26 (×22): 12 [IU] via SUBCUTANEOUS
  Filled 2021-01-18 (×22): qty 1

## 2021-01-18 NOTE — Progress Notes (Signed)
Date of Admission:  12/17/2020      ID: [name] is a 45 y.o. female with   Principal Problem:   Alcohol use disorder, moderate, dependence ([name]) Active Problems:   Alcohol withdrawal ([name])   Bipolar 2 disorder ([name])   Hypokalemia   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[name])   Acute hyponatremia   Hepatic steatosis   Polysubstance abuse ([name])   Hyperglycemia due to type 2 diabetes mellitus ([name])   Hyperbilirubinemia   Lactic acidosis   Alcoholic liver disease ([name])   Sepsis ([name])   Acute hepatic encephalopathy   Pressure injury of skin   Aspiration pneumonia of both lower lobes due to gastric secretions ([name])    Subjective: Underwent paracentesis Pt feeling fine Without oxygen she has some sob/hypoxia  Medications:   budesonide  9 mg Oral Daily   feeding supplement  237 mL Oral TID BM   FLUoxetine  80 mg Oral Daily   fluticasone  1 spray Each Nare Daily   folic acid  1 mg Oral Daily   furosemide  40 mg Oral BID   insulin aspart  0-20 Units Subcutaneous TID WC   insulin aspart  0-5 Units Subcutaneous QHS   insulin aspart  10 Units Subcutaneous TID WC   insulin glargine-yfgn  28 Units Subcutaneous BID   magnesium oxide  400 mg Oral BID   multivitamin with minerals  1 tablet Oral Daily   OLANZapine  5 mg Oral QHS   pantoprazole  40 mg Oral BID   pregabalin  75 mg Oral TID   rifaximin  550 mg Oral BID   spironolactone  100 mg Oral Daily   thiamine  100 mg Oral Daily   Vitamin D (Ergocalciferol)  50,000 Units Oral Q7 days    Objective: Vital signs in last 24 hours: Temp:  [98.2 F (36.8 C)-100.2 F (37.9 C)] 99.3 F (37.4 C) (11/01 1948) Pulse Rate:  [100-121] 100 (11/01 1948) Resp:  [15-20] 18 (11/01 1948) BP: (106-126)/(62-79) 108/69 (11/01 1948) SpO2:  [87 %-99 %] 95 % (11/01 1948)  PHYSICAL EXAM:  General: Alert, cooperative, no distress, icteric sclera  ENT Nares normal. No drainage or sinus tenderness. Lips, mucosa, and tongue  normal. No Thrush Neck: Supple, symmetrical, no adenopathy, thyroid: non tender no carotid bruit and no JVD. Back: No CVA tenderness. Lungs:b/l air entry- decreased bases Heart: Regular rate and rhythm, no murmur, rub or gallop. Abdomen: Soft, distended.no tenderness Extremities: atraumatic, no cyanosis. No edema. No clubbing Edema buttock and sacrum Skin: No rashes or lesions. Or bruising Lymph: Cervical, supraclavicular normal. Neurologic: weakness upper body  Lab Results Recent Labs    01/15/21 0412 01/16/21 0438 01/17/21 0606  WBC 33.2* 32.8* 35.2*  HGB 8.6* 9.1* 9.4*  HCT 27.7* 29.5* 29.3*  NA 134*  --  135  K 4.1  --  4.1  CL 93*  --  93*  CO2 34*  --  34*  BUN 19  --  22*  CREATININE 0.63  --  0.65   Liver Panel Recent Labs    01/15/21 0412  PROT 6.6  ALBUMIN <1.5*  AST 126*  ALT 29  ALKPHOS 272*  BILITOT 6.9*   Sedimentation Rate No results for input(s): ESRSEDRATE in the last 72 hours. C-Reactive Protein No results for input(s): CRP in the last 72 hours.  Microbiology: 01/09/2021 urine culture 30,000 colonies of mixed species 01/06/2021 abdominal fluid culture no growth 01/04/2021 blood cultures no growth  10-22 blood culture  no growth 01/04/2021 catheter tip no growth Studies/Results: CT CHEST WO CONTRAST  Result Date: 01/17/2021 CLINICAL DATA:  Pneumonia, effusion or abscess suspected, xray done persistent leukocytosis EXAM: CT CHEST WITHOUT CONTRAST TECHNIQUE: Multidetector CT imaging of the chest was performed following the standard protocol without IV contrast. COMPARISON:  CT chest 12/29/2020 FINDINGS: Lines and tubes: Right central venous catheter with tip terminating at the superior cavoatrial junction. Cardiovascular: Normal heart size. No significant pericardial effusion. The thoracic aorta is normal in caliber. Likely 2 vessel coronary artery calcifications. Mediastinum/Nodes: No enlarged mediastinal or axillary lymph nodes. Thyroid gland,  trachea, and esophagus demonstrate no significant findings. Lungs/Pleura: Elevated right hemidiaphragm. Bilateral lower lobe peribronchovascular consolidations with air bronchograms. No pulmonary nodule. No pulmonary mass. Interval resolution of right pleural effusion. No pleural effusion. No pneumothorax. Upper Abdomen: No acute abnormality. Musculoskeletal: Subcutaneus soft tissue edema along the lateral right upper abdomen. No suspicious lytic or blastic osseous lesions. No acute displaced fracture. IMPRESSION: 1. Bilateral lower lobe peribronchovascular consolidations with air bronchograms. Findings could represent a combination of infection/inflammation versus atelectasis. Limited evaluation on this noncontrast study. 2. Interval resolution of a right pleural effusion. Elevated right hemidiaphragm. 3. Subcutaneus soft tissue edema along the lateral right upper abdomen. Electronically Signed   By: Tish Frederickson M.D.   On: 01/17/2021 15:59   US Paracentesis  Result Date: 01/17/2021 INDICATION: Cirrhosis with recurrent ascites. Request received for diagnostic and therapeutic paracentesis. EXAM: ULTRASOUND GUIDED PARACENTESIS MEDICATIONS: Local 1% lidocaine only. COMPLICATIONS: None immediate. PROCEDURE: Informed written consent was obtained from the patient after a discussion of the risks, benefits and alternatives to treatment. A timeout was performed prior to the initiation of the procedure. Initial ultrasound scanning demonstrates a moderate amount of ascites within the right lower abdominal quadrant. The right lower abdomen was prepped and draped in the usual sterile fashion. 1% lidocaine was used for local anesthesia. Following this, a 19 gauge, 10-cm, Yueh catheter was introduced. An ultrasound image was saved for documentation purposes. The paracentesis was performed. The catheter was removed and a dressing was applied. The patient tolerated the procedure well without immediate post procedural  complication. FINDINGS: A total of approximately 1800 mL of yellow-colored fluid was removed. Samples were sent to the laboratory as requested by the clinical team. IMPRESSION: Successful ultrasound-guided paracentesis yielding 1.8 liters of peritoneal fluid. Read By: Pattricia Boss PA-C Electronically Signed   By: Corlis Leak M.D.   On: 01/17/2021 12:39   DG Chest Port 1 View  Result Date: 01/17/2021 CLINICAL DATA:  Leukocytosis EXAM: PORTABLE CHEST 1 VIEW COMPARISON:  Previous studies including the examination of 01/05/2021 FINDINGS: Transverse diameter of heart is increased. There are linear densities in left mid and left lower lung fields with interval worsening. There is marked elevation of right hemidiaphragm. There is homogeneous opacification in the right lower lung fields with no significant interval change. Left lateral CP angle is clear. There is no pneumothorax. Tip of central venous catheter is seen in superior vena cava. IMPRESSION: Cardiomegaly. Opacification of right mid and lower lung fields has not changed significantly. This may be due to elevation of right hemidiaphragm along with atelectasis and pleural effusion. There is interval increase in linear densities in left mid and left lower lung fields suggesting worsening of subsegmental atelectasis/pneumonitis. Electronically Signed   By: Ernie Avena M.D.   On: 01/17/2021 11:02    Bilateral lower lobe peribronchovascular consolidations with air bronchograms. Findings could represent a combination of infection/inflammation versus atelectasis. Limited evaluation on  this noncontrast study. 2. Interval resolution of a right pleural effusion. Elevated right hemidiaphragm. Assessment/Plan:   Alcoholic hepatitis with ascites S/p paracentesis X 34 No SPB  Worsening leucocytosis CXR done today shows b/l lower lobe infiltrate CT chest done shows b/l infiltrate  Paracentesis done today shows no SBP Will start zosyn for b/l pneimonia-  as MRSA nares neg will not give vanco   Acute hepatic encephalopathy- resolved   DM- poorly controlled   Fascioscapulo humeral muscular dystrophy   Pt on budesonide oral capsule 9mg  /day- microscopic colitis Discussed the management with care team

## 2021-01-18 NOTE — Progress Notes (Signed)
Date of Admission:  12/17/2020    ID: [name] is a 45 y.o. female    Principal Problem:   Alcohol use disorder, moderate, dependence ([name]) Active Problems:   Alcohol withdrawal ([name])   Bipolar 2 disorder ([name])   Hypokalemia   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[name])   Acute hyponatremia   Hepatic steatosis   Polysubstance abuse ([name])   Hyperglycemia due to type 2 diabetes mellitus ([name])   Hyperbilirubinemia   Lactic acidosis   Alcoholic liver disease ([name])   Sepsis ([name])   Acute hepatic encephalopathy   Pressure injury of skin   Aspiration pneumonia of both lower lobes due to gastric secretions ([name])    Subjective: Pt doing well Says without oxygen supplement she has low oxygen Had covid 2 months ago and previous smoker resp reserve is down No fever No diarrhea  Medications:   budesonide  9 mg Oral Daily   enoxaparin (LOVENOX) injection  0.5 mg/kg Subcutaneous Q24H   feeding supplement  237 mL Oral TID BM   FLUoxetine  80 mg Oral Daily   fluticasone  1 spray Each Nare Daily   folic acid  1 mg Oral Daily   furosemide  40 mg Oral BID   insulin aspart  0-20 Units Subcutaneous TID WC   insulin aspart  0-5 Units Subcutaneous QHS   insulin aspart  12 Units Subcutaneous TID WC   insulin glargine-yfgn  28 Units Subcutaneous BID   magnesium oxide  400 mg Oral BID   multivitamin with minerals  1 tablet Oral Daily   OLANZapine  5 mg Oral QHS   pantoprazole  40 mg Oral BID   pregabalin  75 mg Oral TID   rifaximin  550 mg Oral BID   spironolactone  100 mg Oral Daily   thiamine  100 mg Oral Daily   Vitamin D (Ergocalciferol)  50,000 Units Oral Q7 days    Objective: Vital signs in last 24 hours: Temp:  [98.2 F (36.8 C)-99.3 F (37.4 C)] 99 F (37.2 C) (11/02 0737) Pulse Rate:  [100-111] 110 (11/02 0737) Resp:  [16-18] 18 (11/02 0737) BP: (106-117)/(62-84) 106/84 (11/02 0737) SpO2:  [87 %-99 %] 97 % (11/02 0737)  PHYSICAL EXAM:  General: Alert,  cooperative, no distress, appears stated age. Nasal cannula Head: Normocephalic, without obvious abnormality, atraumatic. Eyes: Conjunctivae clear, anicteric sclerae. Pupils are equal ENT Nares normal. No drainage or sinus tenderness. Lips, mucosa, and tongue normal. No Thrush Neck: Supple, symmetrical, no adenopathy, thyroid: non tender Rt IJ line no carotid bruit and no JVD. Back: No CVA tenderness. Lungs: b/l air entry- decreased bases Heart: irregular Abdomen: Soft, edema of abdominal wall more on the rt side as she lays on her rt all the time Bowel sounds normal. No masses Extremities: atraumatic, no cyanosis. No edema. No clubbing Skin: No rashes or lesions. Or bruising Lymph: Cervical, supraclavicular normal. Neurologic: Grossly non-focal  Lab Results Recent Labs    01/17/21 0606 01/18/21 0510  WBC 35.2* 36.9*  HGB 9.4* 9.1*  HCT 29.3* 28.6*  NA 135 136  K 4.1 3.9  CL 93* 93*  CO2 34* 35*  BUN 22* 22*  CREATININE 0.65 0.58   Microbiology: 01/09/2021 urine culture 30,000 colonies of mixed species 01/06/2021 abdominal fluid culture no growth 01/04/2021 blood cultures no growth  10-22 blood culture no growth 01/04/2021 catheter tip no growth Studies/Results: CT CHEST WO CONTRAST  Result Date: 01/17/2021 CLINICAL DATA:  Pneumonia, effusion or abscess suspected, xray  done persistent leukocytosis EXAM: CT CHEST WITHOUT CONTRAST TECHNIQUE: Multidetector CT imaging of the chest was performed following the standard protocol without IV contrast. COMPARISON:  CT chest 12/29/2020 FINDINGS: Lines and tubes: Right central venous catheter with tip terminating at the superior cavoatrial junction. Cardiovascular: Normal heart size. No significant pericardial effusion. The thoracic aorta is normal in caliber. Likely 2 vessel coronary artery calcifications. Mediastinum/Nodes: No enlarged mediastinal or axillary lymph nodes. Thyroid gland, trachea, and esophagus demonstrate no significant  findings. Lungs/Pleura: Elevated right hemidiaphragm. Bilateral lower lobe peribronchovascular consolidations with air bronchograms. No pulmonary nodule. No pulmonary mass. Interval resolution of right pleural effusion. No pleural effusion. No pneumothorax. Upper Abdomen: No acute abnormality. Musculoskeletal: Subcutaneus soft tissue edema along the lateral right upper abdomen. No suspicious lytic or blastic osseous lesions. No acute displaced fracture. IMPRESSION: 1. Bilateral lower lobe peribronchovascular consolidations with air bronchograms. Findings could represent a combination of infection/inflammation versus atelectasis. Limited evaluation on this noncontrast study. 2. Interval resolution of a right pleural effusion. Elevated right hemidiaphragm. 3. Subcutaneus soft tissue edema along the lateral right upper abdomen. Electronically Signed   By: Iven Finn M.D.   On: 01/17/2021 15:59   US Paracentesis  Result Date: 01/17/2021 INDICATION: Cirrhosis with recurrent ascites. Request received for diagnostic and therapeutic paracentesis. EXAM: ULTRASOUND GUIDED PARACENTESIS MEDICATIONS: Local 1% lidocaine only. COMPLICATIONS: None immediate. PROCEDURE: Informed written consent was obtained from the patient after a discussion of the risks, benefits and alternatives to treatment. A timeout was performed prior to the initiation of the procedure. Initial ultrasound scanning demonstrates a moderate amount of ascites within the right lower abdominal quadrant. The right lower abdomen was prepped and draped in the usual sterile fashion. 1% lidocaine was used for local anesthesia. Following this, a 19 gauge, 10-cm, Yueh catheter was introduced. An ultrasound image was saved for documentation purposes. The paracentesis was performed. The catheter was removed and a dressing was applied. The patient tolerated the procedure well without immediate post procedural complication. FINDINGS: A total of approximately 1800 mL  of yellow-colored fluid was removed. Samples were sent to the laboratory as requested by the clinical team. IMPRESSION: Successful ultrasound-guided paracentesis yielding 1.8 liters of peritoneal fluid. Read By: Tsosie Billing PA-C Electronically Signed   By: Lucrezia Europe M.D.   On: 01/17/2021 12:39   DG Chest Port 1 View  Result Date: 01/17/2021 CLINICAL DATA:  Leukocytosis EXAM: PORTABLE CHEST 1 VIEW COMPARISON:  Previous studies including the examination of 01/05/2021 FINDINGS: Transverse diameter of heart is increased. There are linear densities in left mid and left lower lung fields with interval worsening. There is marked elevation of right hemidiaphragm. There is homogeneous opacification in the right lower lung fields with no significant interval change. Left lateral CP angle is clear. There is no pneumothorax. Tip of central venous catheter is seen in superior vena cava. IMPRESSION: Cardiomegaly. Opacification of right mid and lower lung fields has not changed significantly. This may be due to elevation of right hemidiaphragm along with atelectasis and pleural effusion. There is interval increase in linear densities in left mid and left lower lung fields suggesting worsening of subsegmental atelectasis/pneumonitis. Electronically Signed   By: Elmer Picker M.D.   On: 01/17/2021 11:02     Assessment/Plan:  Alcoholic hepatitis with ascites S/p paracentesis X 34 No SPB  Worsening leucocytosis but clinically improving and does not look septic CXR done  shows b/l lower lobe infiltrate CT chest done shows b/l infiltrate  This looks more like atelectais srather  than pneumonic consolidation Doubt this is the cause of leucocytosis  Paracentesis no SBP Zozyn started on 01/17/21 Will need to remove contral line if no improvement in leucocytosis tomorrow as sometimes there can be a thrombus around the line which can cause leucocytosis Will need incentive spirometry   Acute hepatic  encephalopathy- resolved   DM- poorly controlled   Fascioscapulo humeral muscular dystrophy   Pt on budesonide oral capsule 9mg  /day- microscopic colitis  Discussed the management with the patient and hospitlaist

## 2021-01-18 NOTE — Progress Notes (Signed)
Physical Therapy Treatment Patient Details Name: [name] MRN: [id number] DOB: 1975/10/30 Today's Date: 01/18/2021   History of Present Illness 45 y.o. female with medical history significant for Bipolar disorder, diabetes, alcohol use disorder and polysubstance abuse, FSH muscular dystrophy, with history of frequent falls, last seen by neurology in January 2020, who was brought in by EMS after she fell at home 2 days prior and was unable to get up.    PT Comments    Pt received laying supine in bed and agreeable to attempt standing today but declines desire to transfer to recliner chair. Pt able to complete bed mobility with verbal cues and SPV for safety. Instructed patient on technqiue to improve independence with scooting towards EOB using lateral lean. Pt stood with modA +2 + RW and cues for improving full upright posture. Pt continues to put forth good effort throughout session and improves with verbal encouragement. Pt will continue to benefit from skilled PT services during admission to maintain functional mobility and reduce caregiver burden.    Recommendations for follow up therapy are one component of a multi-disciplinary discharge planning process, led by the attending physician.  Recommendations may be updated based on patient status, additional functional criteria and insurance authorization.  Follow Up Recommendations  Skilled nursing-short term rehab (<3 hours/day)     Assistance Recommended at Discharge Frequent or constant Supervision/Assistance  Equipment Recommendations  Other (comment)    Recommendations for Other Services       Precautions / Restrictions Precautions Precautions: Fall Restrictions Weight Bearing Restrictions: No     Mobility  Bed Mobility Overal bed mobility: Modified Independent Bed Mobility: Supine to Sit;Sit to Supine     Supine to sit: Supervision Sit to supine: Supervision   General bed mobility comments: Cues for improving  ability to scoot towards EOB independently with lateral lean technique. verbal cues for reaching for head board to assist with scooting herself up in bed.    Transfers Overall transfer level: Needs assistance Equipment used: Rolling walker (2 wheels) Transfers: Sit to/from Stand Sit to Stand: Mod assist;+2 physical assistance;From elevated surface           General transfer comment: ModA + 2 assistance for standing at EOB x 3 (1 min, 15 sec, 20 sec)    Ambulation/Gait             General Gait Details: deferred   Stairs             Wheelchair Mobility    Modified Rankin (Stroke Patients Only)       Balance Overall balance assessment: Needs assistance Sitting-balance support: Bilateral upper extremity supported;Feet supported Sitting balance-Leahy Scale: Good     Standing balance support: Bilateral upper extremity supported;During functional activity;Reliant on assistive device for balance   Standing balance comment: Requiring modA +2 for maintaining standing position and heavy B UE support. Able to weight shift A/P slighlty while standing                            Cognition Arousal/Alertness: Awake/alert Behavior During Therapy: WFL for tasks assessed/performed Overall Cognitive Status: Within Functional Limits for tasks assessed                                 General Comments: More engaged in activities today        Exercises  General Comments        Pertinent Vitals/Pain Pain Assessment: No/denies pain    Home Living                          Prior Function            PT Goals (current goals can now be found in the care plan section) Acute Rehab PT Goals Patient Stated Goal: to get stronger and stand again PT Goal Formulation: With patient Time For Goal Achievement: 02/01/21 Potential to Achieve Goals: Fair Progress towards PT goals: Progressing toward goals    Frequency    Min  2X/week      PT Plan Current plan remains appropriate    Co-evaluation              AM-PAC PT "6 Clicks" Mobility   Outcome Measure  Help needed turning from your back to your side while in a flat bed without using bedrails?: A Little Help needed moving from lying on your back to sitting on the side of a flat bed without using bedrails?: A Little Help needed moving to and from a bed to a chair (including a wheelchair)?: A Lot Help needed standing up from a chair using your arms (e.g., wheelchair or bedside chair)?: A Lot Help needed to walk in hospital room?: Total Help needed climbing 3-5 steps with a railing? : Total 6 Click Score: 12    End of Session Equipment Utilized During Treatment: Gait belt;Oxygen Activity Tolerance: Patient tolerated treatment well;Patient limited by fatigue Patient left: in bed;with call bell/phone within reach;with bed alarm set Nurse Communication: Mobility status PT Visit Diagnosis: Other abnormalities of gait and mobility (R26.89);Muscle weakness (generalized) (M62.81);History of falling (Z91.81);Unsteadiness on feet (R26.81);Difficulty in walking, not elsewhere classified (R26.2)     Time: 0263-7858 PT Time Calculation (min) (ACUTE ONLY): 26 min  Charges:  $Therapeutic Activity: 23-37 mins                     Verl Blalock, SPT    Verl Blalock 01/18/2021, 12:28 PM

## 2021-01-18 NOTE — Progress Notes (Signed)
Inpatient Diabetes Program Recommendations  AACE/ADA: New Consensus Statement on Inpatient Glycemic Control (2015)  Target Ranges:  Prepandial:   less than 140 mg/dL      Peak postprandial:   less than 180 mg/dL (1-2 hours)      Critically ill patients:  140 - 180 mg/dL   Lab Results  Component Value Date   GLUCAP 185 (H) 01/18/2021   HGBA1C 11.1 (H) 12/18/2020    Review of Glycemic Control Results for SHUNDRA, WIRSING (MRN [id number]) as of 01/18/2021 10:06  Ref. Range 01/17/2021 07:41 01/17/2021 11:58 01/17/2021 16:31 01/17/2021 22:09 01/18/2021 07:33  Glucose-Capillary Latest Ref Range: 70 - 99 mg/dL 606 (H) 770 (H) 340 (H) 231 (H) 185 (H)  Home DM Meds: Janumet XR 100/1000 mg Daily                             Metformin 500 mg BID                             Ozempic Qweek Current Orders: Semglee 28 units BID  Novolog 0-20 units TID ac/hs  Novolog 10 units TID with meals Inpatient Diabetes Program Recommendations:   Consider increasing Novolog meal coverage to 12 units tid with meals (hold if patient eats less than 50% or NPO).   Thanks,  Beryl Meager, RN, BC-ADM Inpatient Diabetes Coordinator Pager [phone]  (8a-5p)

## 2021-01-18 NOTE — Progress Notes (Signed)
PROGRESS NOTE    [name]  [id number] DOB: [birth year] DOA: 12/17/2020 PCP: Janine Limbo, PA-C    Brief Narrative:  45 y.o. female with medical history significant for bipolar disorder, type 2 diabetes mellitus, alcohol use disorder, polysubstance use disorder, FSHD muscular dystrophy, history of falls, microscopic colitis on budesonide, who was brought to the hospital because of a fall and inability to get up on her own.  She was found to have sepsis secondary to aspiration pneumonia, severe hyponatremia with sodium level of 113, hypokalemia with potassium of 2.9, hyperbilirubinemia with bilirubin of 11.9 and elevated ammonia of 52.   She was treated with empiric IV antibiotics and IV fluids.  Magnesium, phosphorus and potassium were repleted.  She was placed on 2 L/min oxygen for acute hypoxemic respiratory failure.  She remains on 2 L/min oxygen for chronic hypoxemic respiratory failure.  She was given lactulose and rifaximin for hepatic encephalopathy.  She underwent paracentesis for ascites.  There was no evidence of SBP.  After completing antibiotic therapy (Unasyn and Zyvox), she continued to have persistent leukocytosis and she was found to have UTI.  ID was consulted to assist with management.  11/2: Uptrending leukocytosis.  Downtrending procalcitonin.  T-max 100.2 over the last 24 hours.   Assessment & Plan:   Principal Problem:   Alcohol use disorder, moderate, dependence ([name]) Active Problems:   Alcohol withdrawal ([name])   Bipolar 2 disorder ([name])   Hypokalemia   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[place])   Acute hyponatremia   Hepatic steatosis   Polysubstance abuse ([name])   Hyperglycemia due to type 2 diabetes mellitus ([name])   Hyperbilirubinemia   Lactic acidosis   Alcoholic liver disease ([name])   Sepsis ([name])   Acute hepatic encephalopathy   Pressure injury of skin   Aspiration pneumonia of both lower lobes due to gastric secretions ([name])  Sepsis  secondary to aspiration pneumonia Persistent severe leukocytosis Patient has completed course of IV cefepime on 10/26 White blood cell continues to uptrend Paracentesis 11/1, negative for SBP Possible aspiration on chest imaging Infectious disease consulted Plan: Continue Zosyn Monitor cultures Trend vitals and fever curve  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 Acute hepatic encephalopathy, resolved Hypoalbuminemia Pulmonary hypertension Status post paracentesis x4 this admission Most recent 11/1, negative for SBP Plan: Continue Lasix Continue Aldactone Continue rifaximin Monitor mental status  Chronic hypoxemic respiratory failure Unable to wean off 2 L Incentive spirometry and wean as tolerated  Bipolar disorder Depression Appears stable Continue current psychiatric regimen  Type 2 diabetes mellitus with hyperglycemia Semglee 28 units twice daily NovoLog 12 units 3 times daily with meals Carb modified diet Diabetes coordinator consult   DVT prophylaxis: SQ Lovenox Code Status: Full Family Communication: None Disposition Plan: Status is: Inpatient  Remains inpatient appropriate because: Uptrending leukocytosis.  Remains on IV antibiotics.       Level of care: Med-Surg  Consultants:  ID  Procedures:  None  Antimicrobials: Zosyn   Subjective: Seen and examined.  Resting comfortably in bed.  No visible distress.  No pain complaints.  Objective: Vitals:   01/17/21 1619 01/17/21 1948 01/18/21 0315 01/18/21 0737  BP: 114/67 108/69 109/62 106/84  Pulse: (!) 110 100 (!) 102 (!) 110  Resp:  18 16 18   Temp: 98.4 F (36.9 C) 99.3 F (37.4 C) 98.5 F (36.9 C) 99 F (37.2 C)  TempSrc: Oral Oral Oral Oral  SpO2: 95% 95% 99% 97%  Weight:      Height:  Intake/Output Summary (Last 24 hours) at 01/18/2021 1030 Last data filed at 01/18/2021 0900 Gross per 24 hour  Intake 182.82 ml  Output 2401 ml  Net -2218.18 ml   Filed Weights   01/10/21  0500 01/11/21 0500 01/12/21 0353  Weight: 108.5 kg 108.4 kg 107.1 kg    Examination:  General exam: No acute distress Respiratory system: Bibasilar crackles.  Normal work of breathing.  2 L Cardiovascular system: S1-S2, RRR, no murmurs, no pedal edema Gastrointestinal system: Soft, NT/ND, normal bowel sounds Central nervous system: Alert and oriented. No focal neurological deficits. Extremities: Symmetric 5 x 5 power. Skin: No rashes, lesions or ulcers Psychiatry: Judgement and insight appear normal. Mood & affect appropriate.     Data Reviewed: I have personally reviewed following labs and imaging studies  CBC: Recent Labs  Lab 01/13/21 0545 01/15/21 0412 01/16/21 0438 01/17/21 0606 01/18/21 0510  WBC 28.4* 33.2* 32.8* 35.2* 36.9*  NEUTROABS 23.4* 27.4* 27.2* 29.7* 31.7*  HGB 8.5* 8.6* 9.1* 9.4* 9.1*  HCT 26.9* 27.7* 29.5* 29.3* 28.6*  MCV 112.1* 109.9* 112.2* 111.8* 111.3*  PLT 273 267 255 251 [id number]   Basic Metabolic Panel: Recent Labs  Lab 01/12/21 0800 01/15/21 0412 01/16/21 0438 01/17/21 0606 01/18/21 0510  NA 133* 134*  --  135 136  K 4.4 4.1  --  4.1 3.9  CL 94* 93*  --  93* 93*  CO2 32 34*  --  34* 35*  GLUCOSE 325* 181*  --  125* 231*  BUN 17 19  --  22* 22*  CREATININE 0.62 0.63  --  0.65 0.58  CALCIUM 8.6* 8.4*  --  8.3* 8.3*  MG 1.7 1.5* 1.5* 1.6* 1.7   GFR: Estimated Creatinine Clearance: 113.1 mL/min (by C-G formula based on SCr of 0.58 mg/dL). Liver Function Tests: Recent Labs  Lab 01/15/21 0412  AST 126*  ALT 29  ALKPHOS 272*  BILITOT 6.9*  PROT 6.6  ALBUMIN <1.5*   No results for input(s): LIPASE, AMYLASE in the last 168 hours. No results for input(s): AMMONIA in the last 168 hours. Coagulation Profile: No results for input(s): INR, PROTIME in the last 168 hours. Cardiac Enzymes: No results for input(s): CKTOTAL, CKMB, CKMBINDEX, TROPONINI in the last 168 hours. BNP (last 3 results) No results for input(s): PROBNP in the last 8760  hours. HbA1C: No results for input(s): HGBA1C in the last 72 hours. CBG: Recent Labs  Lab 01/17/21 0741 01/17/21 1158 01/17/21 1631 01/17/21 2209 01/18/21 0733  GLUCAP 113* 185* 271* 231* 185*   Lipid Profile: No results for input(s): CHOL, HDL, LDLCALC, TRIG, CHOLHDL, LDLDIRECT in the last 72 hours. Thyroid Function Tests: No results for input(s): TSH, T4TOTAL, FREET4, T3FREE, THYROIDAB in the last 72 hours. Anemia Panel: No results for input(s): VITAMINB12, FOLATE, FERRITIN, TIBC, IRON, RETICCTPCT in the last 72 hours. Sepsis Labs: Recent Labs  Lab 01/17/21 0606 01/18/21 0510  PROCALCITON 2.24 2.06    Recent Results (from the past 240 hour(s))  Urine Culture     Status: Abnormal   Collection Time: 01/09/21  5:30 AM   Specimen: Urine, Clean Catch  Result Value Ref Range Status   Specimen Description   Final    URINE, CLEAN CATCH Performed at [facility], [address] [place], [place] [address]    Special Requests   Final    NONE Performed at [facility], [address], [place],  [address]    Culture (A)  Final  30,000 COLONIES/mL MULTIPLE SPECIES PRESENT, SUGGEST RECOLLECTION   Report Status 01/10/2021 FINAL  Final  Body fluid culture w Gram Stain     Status: None (Preliminary result)   Collection Time: 01/17/21 11:15 AM   Specimen: PATH Cytology Peritoneal fluid  Result Value Ref Range Status   Specimen Description   Final    PERITONEAL Performed at [facility], [address], [place], [place] [address]    Special Requests   Final    NONE Performed at [facility], [place], [place] [address]    Gram Stain NO WBC SEEN NO ORGANISMS SEEN   Final   Culture   Final    NO GROWTH < 12 HOURS Performed at [facility], [place] [address] [place], [place] [address]    Report Status PENDING  Incomplete  MRSA Next Gen by PCR, Nasal     Status: None   Collection Time: 01/17/21  4:07 PM    Specimen: Nasal Mucosa; Nasal Swab  Result Value Ref Range Status   MRSA by PCR Next Gen NOT DETECTED NOT DETECTED Final    Comment: (NOTE) The GeneXpert MRSA Assay (FDA approved for NASAL specimens only), is one component of a comprehensive MRSA colonization surveillance program. It is not intended to diagnose MRSA infection nor to guide or monitor treatment for MRSA infections. Test performance is not FDA approved in patients less than 56 years old. Performed at [facility], [address] [place], [place] [address]          Radiology Studies: CT CHEST WO CONTRAST  Result Date: 01/17/2021 CLINICAL DATA:  Pneumonia, effusion or abscess suspected, xray done persistent leukocytosis EXAM: CT CHEST WITHOUT CONTRAST TECHNIQUE: Multidetector CT imaging of the chest was performed following the standard protocol without IV contrast. COMPARISON:  CT chest 12/29/2020 FINDINGS: Lines and tubes: Right central venous catheter with tip terminating at the superior cavoatrial junction. Cardiovascular: Normal heart size. No significant pericardial effusion. The thoracic aorta is normal in caliber. Likely 2 vessel coronary artery calcifications. Mediastinum/Nodes: No enlarged mediastinal or axillary lymph nodes. Thyroid gland, trachea, and esophagus demonstrate no significant findings. Lungs/Pleura: Elevated right hemidiaphragm. Bilateral lower lobe peribronchovascular consolidations with air bronchograms. No pulmonary nodule. No pulmonary mass. Interval resolution of right pleural effusion. No pleural effusion. No pneumothorax. Upper Abdomen: No acute abnormality. Musculoskeletal: Subcutaneus soft tissue edema along the lateral right upper abdomen. No suspicious lytic or blastic osseous lesions. No acute displaced fracture. IMPRESSION: 1. Bilateral lower lobe peribronchovascular consolidations with air bronchograms. Findings could represent a combination of infection/inflammation versus atelectasis.  Limited evaluation on this noncontrast study. 2. Interval resolution of a right pleural effusion. Elevated right hemidiaphragm. 3. Subcutaneus soft tissue edema along the lateral right upper abdomen. Electronically Signed   By: Iven Finn M.D.   On: 01/17/2021 15:59   US Paracentesis  Result Date: 01/17/2021 INDICATION: Cirrhosis with recurrent ascites. Request received for diagnostic and therapeutic paracentesis. EXAM: ULTRASOUND GUIDED PARACENTESIS MEDICATIONS: Local 1% lidocaine only. COMPLICATIONS: None immediate. PROCEDURE: Informed written consent was obtained from the patient after a discussion of the risks, benefits and alternatives to treatment. A timeout was performed prior to the initiation of the procedure. Initial ultrasound scanning demonstrates a moderate amount of ascites within the right lower abdominal quadrant. The right lower abdomen was prepped and draped in the usual sterile fashion. 1% lidocaine was used for local anesthesia. Following this, a 19 gauge, 10-cm, Yueh catheter was introduced. An ultrasound image  was saved for documentation purposes. The paracentesis was performed. The catheter was removed and a dressing was applied. The patient tolerated the procedure well without immediate post procedural complication. FINDINGS: A total of approximately 1800 mL of yellow-colored fluid was removed. Samples were sent to the laboratory as requested by the clinical team. IMPRESSION: Successful ultrasound-guided paracentesis yielding 1.8 liters of peritoneal fluid. Read By: Pattricia Boss PA-C Electronically Signed   By: Corlis Leak M.D.   On: 01/17/2021 12:39   DG Chest Port 1 View  Result Date: 01/17/2021 CLINICAL DATA:  Leukocytosis EXAM: PORTABLE CHEST 1 VIEW COMPARISON:  Previous studies including the examination of 01/05/2021 FINDINGS: Transverse diameter of heart is increased. There are linear densities in left mid and left lower lung fields with interval worsening. There is marked  elevation of right hemidiaphragm. There is homogeneous opacification in the right lower lung fields with no significant interval change. Left lateral CP angle is clear. There is no pneumothorax. Tip of central venous catheter is seen in superior vena cava. IMPRESSION: Cardiomegaly. Opacification of right mid and lower lung fields has not changed significantly. This may be due to elevation of right hemidiaphragm along with atelectasis and pleural effusion. There is interval increase in linear densities in left mid and left lower lung fields suggesting worsening of subsegmental atelectasis/pneumonitis. Electronically Signed   By: Ernie Avena M.D.   On: 01/17/2021 11:02        Scheduled Meds:  budesonide  9 mg Oral Daily   feeding supplement  237 mL Oral TID BM   FLUoxetine  80 mg Oral Daily   fluticasone  1 spray Each Nare Daily   folic acid  1 mg Oral Daily   furosemide  40 mg Oral BID   insulin aspart  0-20 Units Subcutaneous TID WC   insulin aspart  0-5 Units Subcutaneous QHS   insulin aspart  12 Units Subcutaneous TID WC   insulin glargine-yfgn  28 Units Subcutaneous BID   magnesium oxide  400 mg Oral BID   multivitamin with minerals  1 tablet Oral Daily   OLANZapine  5 mg Oral QHS   pantoprazole  40 mg Oral BID   pregabalin  75 mg Oral TID   rifaximin  550 mg Oral BID   spironolactone  100 mg Oral Daily   thiamine  100 mg Oral Daily   Vitamin D (Ergocalciferol)  50,000 Units Oral Q7 days   Continuous Infusions:  sodium chloride 10 mL/hr at 01/18/21 0359   magnesium sulfate bolus IVPB     piperacillin-tazobactam (ZOSYN)  IV 3.375 g (01/18/21 0510)     LOS: 31 days    Time spent: 35 minutes    Tresa Moore, MD [facility]   If 7PM-7AM, please contact night-coverage  01/18/2021, 10:30 AM

## 2021-01-18 NOTE — Progress Notes (Signed)
Nutrition Follow Up Note   DOCUMENTATION CODES:   Morbid obesity  INTERVENTION:   Ensure Enlive po to TID, each supplement provides 350 kcal and 20 grams of protein  MVI po daily   NUTRITION DIAGNOSIS:   Inadequate oral intake related to acute illness as evidenced by per patient/family report.  GOAL:   Patient will meet greater than or equal to 90% of their needs -progressing   MONITOR:   PO intake, Supplement acceptance, Labs, Weight trends, Skin, I & O's  ASSESSMENT:   45 y.o. 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 Pt found to have cirrhosis and pulmonary hypertension.   Pt continues to have good appetite and oral intake; pt eating 100% of meals and reports that she is drinking 3 Ensure per day. Per chart, pt is weight stable since admit. Plan is for SNF at discharge.   Medications reviewed and include: lovenox, folic acid, lasix, insulin, Mg oxide, MVI, protonix, aldactone, thiamine, vitamin D, zosyn   Labs reviewed: K 3.9 wnl, BUN 22(H), Mg 1.7 wnl Wbc- 36.9(H), Hgb 9.1(L), Hct 28.6(L) Cbgs- 185, 253 x 24 hrs  Diet Order:   Diet Order             Diet Carb Modified Fluid consistency: Thin; Room service appropriate? Yes  Diet effective now                  EDUCATION NEEDS:   Education needs have been addressed  Skin:  Skin Assessment: Reviewed RN Assessment  Last BM:  11/1- type 6  Height:   Ht Readings from Last 1 Encounters:  12/17/20 5\' 7"  (1.702 m)    Weight:   Wt Readings from Last 1 Encounters:  01/12/21 107.1 kg    Ideal Body Weight:  61.36 kg  BMI:  Body mass index is 36.98 kg/m.  Estimated Nutritional Needs:   Kcal:  2100-2400kcal/day  Protein:  105-120g/day  Fluid:   1.9-2.2L/day  01/14/21 MS, RD, LDN Please refer to [facility] for RD and/or RD on-call/weekend/after hours pager

## 2021-01-19 ENCOUNTER — Inpatient Hospital Stay: Payer: Medicare Other | Admitting: Radiology

## 2021-01-19 DIAGNOSIS — D72829 Elevated white blood cell count, unspecified: Secondary | ICD-10-CM | POA: Diagnosis not present

## 2021-01-19 DIAGNOSIS — K7011 Alcoholic hepatitis with ascites: Secondary | ICD-10-CM | POA: Diagnosis not present

## 2021-01-19 HISTORY — PX: IR REMOVAL TUN CV CATH W/O FL: IMG2289

## 2021-01-19 LAB — GLUCOSE, CAPILLARY
Glucose-Capillary: 93 mg/dL (ref 70–99)
Glucose-Capillary: 210 mg/dL — ABNORMAL HIGH (ref 70–99)
Glucose-Capillary: 187 mg/dL — ABNORMAL HIGH (ref 70–99)
Glucose-Capillary: 141 mg/dL — ABNORMAL HIGH (ref 70–99)

## 2021-01-19 LAB — CBC WITH DIFFERENTIAL/PLATELET
Abs Immature Granulocytes: 0.53 10*3/uL — ABNORMAL HIGH (ref 0.00–0.07)
Basophils Absolute: 0.1 10*3/uL (ref 0.0–0.1)
Basophils Relative: 0 %
Eosinophils Absolute: 0.3 10*3/uL (ref 0.0–0.5)
Eosinophils Relative: 1 %
HCT: 27.5 % — ABNORMAL LOW (ref 36.0–46.0)
Hemoglobin: 8.5 g/dL — ABNORMAL LOW (ref 12.0–15.0)
Immature Granulocytes: 2 %
Lymphocytes Relative: 9 %
Lymphs Abs: 3.4 10*3/uL (ref 0.7–4.0)
MCH: 34.1 pg — ABNORMAL HIGH (ref 26.0–34.0)
MCHC: 30.9 g/dL (ref 30.0–36.0)
MCV: 110.4 fL — ABNORMAL HIGH (ref 80.0–100.0)
Monocytes Absolute: 1.1 10*3/uL — ABNORMAL HIGH (ref 0.1–1.0)
Monocytes Relative: 3 %
Neutro Abs: 30.3 10*3/uL — ABNORMAL HIGH (ref 1.7–7.7)
Neutrophils Relative %: 85 %
Platelets: 245 10*3/uL (ref 150–400)
RBC: 2.49 MIL/uL — ABNORMAL LOW (ref 3.87–5.11)
RDW: 16.3 % — ABNORMAL HIGH (ref 11.5–15.5)
Smear Review: NORMAL
WBC: 35.7 10*3/uL — ABNORMAL HIGH (ref 4.0–10.5)
nRBC: 0.1 % (ref 0.0–0.2)

## 2021-01-19 LAB — BASIC METABOLIC PANEL
Anion gap: 10 (ref 5–15)
BUN: 21 mg/dL — ABNORMAL HIGH (ref 6–20)
CO2: 34 mmol/L — ABNORMAL HIGH (ref 22–32)
Calcium: 7.8 mg/dL — ABNORMAL LOW (ref 8.9–10.3)
Chloride: 90 mmol/L — ABNORMAL LOW (ref 98–111)
Creatinine, Ser: 0.69 mg/dL (ref 0.44–1.00)
GFR, Estimated: >60 mL/min (ref >60)
Glucose, Bld: 107 mg/dL — ABNORMAL HIGH (ref 70–99)
Potassium: 3.6 mmol/L (ref 3.5–5.1)
Sodium: 134 mmol/L — ABNORMAL LOW (ref 135–145)

## 2021-01-19 LAB — PROCALCITONIN: Procalcitonin: 1.95 ng/mL

## 2021-01-19 MED ORDER — DIPHENOXYLATE-ATROPINE 2.5-0.025 MG PO TABS
1.0000 | ORAL_TABLET | Freq: Four times a day (QID) | ORAL | Status: DC | PRN
  Administered 2021-01-19 – 2021-01-23 (×3): 1 via ORAL
  Filled 2021-01-19 (×3): qty 1

## 2021-01-19 MED ORDER — LIDOCAINE HCL 1 % IJ SOLN
INTRAMUSCULAR | Status: AC
  Filled 2021-01-19: qty 20

## 2021-01-19 MED ORDER — AMOXICILLIN-POT CLAVULANATE 875-125 MG PO TABS
1.0000 | ORAL_TABLET | Freq: Two times a day (BID) | ORAL | Status: DC
Start: 2021-01-19 — End: 2021-01-24
  Administered 2021-01-19 – 2021-01-24 (×10): 1 via ORAL
  Filled 2021-01-19 (×10): qty 1

## 2021-01-19 MED ORDER — SODIUM CHLORIDE 0.9% FLUSH: 10.0000 mL | INTRAVENOUS | Status: DC | PRN

## 2021-01-19 NOTE — Progress Notes (Signed)
Occupational Therapy Treatment Patient Details Name: [name] MRN: [id number] DOB: 1975/11/12 Today's Date: 01/19/2021   History of present illness 45 y.o. female with medical history significant for Bipolar disorder, diabetes, alcohol use disorder and polysubstance abuse, FSH muscular dystrophy, with history of frequent falls, last seen by neurology in January 2020, who was brought in by EMS after she fell at home 2 days prior and was unable to get up.   OT comments  Pt seen for OT tx. Pt initially agreeable to standing attempts but then once seated EOB pt reports realizing how fatigued she was and attributes this to yesterday's PT session. Pt agreeable to seated EOB ADL session. Pt set up with bathing items and pt able to perform UB and LB bathing while seated (only laying supine to wash her perianal area), requiring MIN A to wash her back and her feet. Pt indep with donning/doffing socks quickly (difficulty maintaining figure 4 position for bathing). Pt tolerated well, requiring PRN VC for PLB. SpO2 ~90% on 2L. HR in 120's with exertion. Pt instructed in IS use and encouraged frequent use to support lung function. Pt verbalized understanding. Pt continues to benefit from skilled OT services and continue to recommend SNF at this time.    Recommendations for follow up therapy are one component of a multi-disciplinary discharge planning process, led by the attending physician.  Recommendations may be updated based on patient status, additional functional criteria and insurance authorization.    Follow Up Recommendations  Skilled nursing-short term rehab (<3 hours/day)    Assistance Recommended at Discharge    Equipment Recommendations  [facility]    Recommendations for Other Services      Precautions / Restrictions Restrictions Weight Bearing Restrictions: No       Mobility Bed Mobility Overal bed mobility: Modified Independent Bed Mobility: Supine to Sit;Sit to Supine     Supine to  sit: Supervision;HOB elevated Sit to supine: Supervision   General bed mobility comments: increased effort/time    Transfers                   General transfer comment: Pt initially agreeable to attempted but then declined once EOB stating that she "overdid it" with PT previous date     Balance Overall balance assessment: Needs assistance Sitting-balance support: No upper extremity supported;Feet supported Sitting balance-Leahy Scale: Good                                     ADL either performed or assessed with clinical judgement   ADL Overall ADL's : Needs assistance/impaired     Grooming: Sitting;Set up;Wash/dry face   Upper Body Bathing: Sitting;Minimal assistance;Set up Upper Body Bathing Details (indicate cue type and reason): Min A to wash back, set up otherwise Lower Body Bathing: Sitting/lateral leans;Minimal assistance Lower Body Bathing Details (indicate cue type and reason): Min A to wash feet while seated EOB Upper Body Dressing : Sitting;Set up;Supervision/safety Upper Body Dressing Details (indicate cue type and reason): supervision to ensure IV undisturbed                         Vision       Perception     Praxis      Cognition Arousal/Alertness: Awake/alert Behavior During Therapy: WFL for tasks assessed/performed Overall Cognitive Status: Within Functional Limits for tasks assessed  Exercises Other Exercises Other Exercises: Pt instructed in IS use to support lung health   Shoulder Instructions       General Comments      Pertinent Vitals/ Pain       Pain Assessment: No/denies pain  Home Living                                          Prior Functioning/Environment              Frequency  Min 2X/week        Progress Toward Goals  OT Goals(current goals can now be found in the care plan section)  Progress towards  OT goals: Progressing toward goals  Acute Rehab OT Goals Patient Stated Goal: get stronger OT Goal Formulation: With patient Time For Goal Achievement: 01/30/21 Potential to Achieve Goals: Good  Plan Discharge plan remains appropriate;Frequency remains appropriate    Co-evaluation                 AM-PAC OT "6 Clicks" Daily Activity     Outcome Measure   Help from another person eating meals?: None Help from another person taking care of personal grooming?: None Help from another person toileting, which includes using toliet, bedpan, or urinal?: A Lot Help from another person bathing (including washing, rinsing, drying)?: A Little Help from another person to put on and taking off regular upper body clothing?: A Little Help from another person to put on and taking off regular lower body clothing?: A Lot 6 Click Score: 18    End of Session Equipment Utilized During Treatment: Oxygen  OT Visit Diagnosis: Unsteadiness on feet (R26.81);Muscle weakness (generalized) (M62.81);History of falling (Z91.81)   Activity Tolerance Patient tolerated treatment well   Patient Left in bed;with call bell/phone within reach;with bed alarm set;with nursing/sitter in room   Nurse Communication          Time: 7290-2111 OT Time Calculation (min): 24 min  Charges: OT General Charges $OT Visit: 1 Visit OT Treatments $Self Care/Home Management : 23-37 mins  Arman Filter., MPH, MS, OTR/L ascom [phone] 01/19/21, 1:48 PM

## 2021-01-19 NOTE — Progress Notes (Signed)
PROGRESS NOTE    [name]  WER:[id number] DOB: 1975-12-20 DOA: 12/17/2020 PCP: Eunice Blase, PA-C    Brief Narrative:  45 y.o. female with medical history significant for bipolar disorder, type 2 diabetes mellitus, alcohol use disorder, polysubstance use disorder, FSHD muscular dystrophy, history of falls, microscopic colitis on budesonide, who was brought to the hospital because of a fall and inability to get up on her own.  She was found to have sepsis secondary to aspiration pneumonia, severe hyponatremia with sodium level of 113, hypokalemia with potassium of 2.9, hyperbilirubinemia with bilirubin of 11.9 and elevated ammonia of 52.   She was treated with empiric IV antibiotics and IV fluids.  Magnesium, phosphorus and potassium were repleted.  She was placed on 2 L/min oxygen for acute hypoxemic respiratory failure.  She remains on 2 L/min oxygen for chronic hypoxemic respiratory failure.  She was given lactulose and rifaximin for hepatic encephalopathy.  She underwent paracentesis for ascites.  There was no evidence of SBP.  After completing antibiotic therapy (Unasyn and Zyvox), she continued to have persistent leukocytosis and she was found to have UTI.  ID was consulted to assist with management.  11/2: Uptrending leukocytosis.  Downtrending procalcitonin.  T-max 100.2 over the last 24 hours. 11/3: Patient feels well.  Procalcitonin downtrending.  Leukocytosis slight downtrend but persistent.  No fevers or minimal   Assessment & Plan:   Principal Problem:   Alcohol use disorder, moderate, dependence ([name]) Active Problems:   Alcohol withdrawal ([name])   Bipolar 2 disorder ([name])   Hypokalemia   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[name])   Acute hyponatremia   Hepatic steatosis   Polysubstance abuse ([name])   Hyperglycemia due to type 2 diabetes mellitus ([name])   Hyperbilirubinemia   Lactic acidosis   Alcoholic liver disease ([name])   Sepsis ([name])   Acute hepatic  encephalopathy   Pressure injury of skin   Aspiration pneumonia of both lower lobes due to gastric secretions ([name])  Sepsis secondary to aspiration pneumonia Persistent severe leukocytosis Patient has completed course of IV cefepime on 10/26 All blood cells remain elevated Paracentesis 11/1, negative for SBP Possible aspiration on chest imaging Infectious disease consulted Plan: Discontinue central line.  Patient will need to return to IR assisted tunneled line.  Once peripheral access is replaced can resume Zosyn for now.  Monitor vitals and fever curve.  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  Educated at bedside  Bipolar disorder Depression Appears stable Continue current psychiatric regimen  Type 2 diabetes mellitus with hyperglycemia Semglee 28 units twice daily NovoLog 12 units 3 times daily with meals Carb modified diet Diabetes coordinator consult   DVT prophylaxis: SQ Lovenox Code Status: Full Family Communication: None Disposition Plan: Status is: Inpatient  Remains inpatient appropriate because: Marked leukocytosis of unclear etiology.  Dispo and plan pending.      Level of care: Med-Surg  Consultants:  ID  Procedures:  None  Antimicrobials: Zosyn   Subjective: Patient seen and examined.  Resting comfortably in bed.  No visible distress.  Answers all questions appropriately.  No pain complaints.  Objective: Vitals:   01/18/21 1605 01/18/21 2027 01/19/21 0304 01/19/21 0825  BP: 114/73 117/74 109/69 120/68  Pulse: (!) 105 (!) 103 (!) 110 (!) 108  Resp: 18 18 18  18   Temp: 98.6 F (37 C) 98.6 F (37 C) 99.8 F (37.7 C) 98.1 F (36.7 C)  TempSrc: Oral Oral Oral Oral  SpO2: 98% 98% 95% 97%  Weight:      Height:        Intake/Output Summary (Last 24 hours) at 01/19/2021 1032 Last data filed at 01/19/2021 0830 Gross per 24 hour  Intake 1462.37 ml  Output 1300 ml  Net 162.37 ml   Filed Weights   01/10/21 0500 01/11/21 0500 01/12/21 0353  Weight: 108.5 kg 108.4 kg 107.1 kg    Examination:  General exam: No apparent distress Respiratory system: Bibasilar crackles.  Normal work of breathing.  2 L Cardiovascular system: S1-S2, RRR, no murmurs, no pedal edema Gastrointestinal system: Soft, NT/ND, normal bowel sounds Central nervous system: Alert and oriented. No focal neurological deficits. Extremities: Symmetric 5 x 5 power. Skin: No rashes, lesions or ulcers Psychiatry: Judgement and insight appear normal. Mood & affect appropriate.     Data Reviewed: I have personally reviewed following labs and imaging studies  CBC: Recent Labs  Lab 01/15/21 0412 01/16/21 0438 01/17/21 0606 01/18/21 0510 01/19/21 0840  WBC 33.2* 32.8* 35.2* 36.9* 35.7*  NEUTROABS 27.4* 27.2* 29.7* 31.7* 30.3*  HGB 8.6* 9.1* 9.4* 9.1* 8.5*  HCT 27.7* 29.5* 29.3* 28.6* 27.5*  MCV 109.9* 112.2* 111.8* 111.3* 110.4*  PLT 267 255 251 260 [id number]   Basic Metabolic Panel: Recent Labs  Lab 01/15/21 0412 01/16/21 0438 01/17/21 0606 01/18/21 0510 01/19/21 0840  NA 134*  --  135 136 134*  K 4.1  --  4.1 3.9 3.6  CL 93*  --  93* 93* 90*  CO2 34*  --  34* 35* 34*  GLUCOSE 181*  --  125* 231* 107*  BUN 19  --  22* 22* 21*  CREATININE 0.63  --  0.65 0.58 0.69  CALCIUM 8.4*  --  8.3* 8.3* 7.8*  MG 1.5* 1.5* 1.6* 1.7  --    GFR: Estimated Creatinine Clearance: 113.1 mL/min (by C-G formula based on SCr of 0.69 mg/dL). Liver Function Tests: Recent Labs  Lab 01/15/21 0412  AST 126*  ALT 29  ALKPHOS 272*  BILITOT 6.9*  PROT 6.6  ALBUMIN <1.5*   No results  for input(s): LIPASE, AMYLASE in the last 168 hours. No results for input(s): AMMONIA in the last 168 hours. Coagulation Profile: No results for input(s): INR, PROTIME in the last 168 hours. Cardiac Enzymes: No results for input(s): CKTOTAL, CKMB, CKMBINDEX, TROPONINI in the last 168 hours. BNP (last 3 results) No results for input(s): PROBNP in the last 8760 hours. HbA1C: No results for input(s): HGBA1C in the last 72 hours. CBG: Recent Labs  Lab 01/18/21 0733 01/18/21 1134 01/18/21 1630 01/18/21 2114 01/19/21 0818  GLUCAP 185* 253* 205* 206* 93   Lipid Profile: No results for input(s): CHOL, HDL, LDLCALC, TRIG, CHOLHDL, LDLDIRECT in the last 72 hours. Thyroid Function Tests: No results for input(s): TSH, T4TOTAL, FREET4, T3FREE, THYROIDAB in the last 72 hours. Anemia Panel: No results for input(s): VITAMINB12, FOLATE, FERRITIN, TIBC, IRON, RETICCTPCT in the last 72 hours. Sepsis Labs: Recent Labs  Lab 01/17/21 0606 01/18/21 0510 01/19/21 0541  PROCALCITON 2.24 2.06 1.95    Recent Results (from the past 240 hour(s))  Body fluid culture w Gram Stain     Status: None (Preliminary result)   Collection Time: 01/17/21 11:15 AM  Specimen: PATH Cytology Peritoneal fluid  Result Value Ref Range Status   Specimen Description   Final    PERITONEAL Performed at [facility], [address], [place], [place] [address]    Special Requests   Final    NONE Performed at [facility], [place], [place] [address]    Gram Stain NO WBC SEEN NO ORGANISMS SEEN   Final   Culture   Final    NO GROWTH 2 DAYS Performed at [facility], [place] [address], [place], [place] [address]    Report Status PENDING  Incomplete  MRSA Next Gen by PCR, Nasal     Status: None   Collection Time: 01/17/21  4:07 PM   Specimen: Nasal Mucosa; Nasal Swab  Result Value Ref Range Status   MRSA by PCR Next Gen NOT DETECTED NOT DETECTED Final    Comment: (NOTE) The  GeneXpert MRSA Assay (FDA approved for NASAL specimens only), is one component of a comprehensive MRSA colonization surveillance program. It is not intended to diagnose MRSA infection nor to guide or monitor treatment for MRSA infections. Test performance is not FDA approved in patients less than 47 years old. Performed at [facility], [address] [place], [place] [address]          Radiology Studies: CT CHEST WO CONTRAST  Result Date: 01/17/2021 CLINICAL DATA:  Pneumonia, effusion or abscess suspected, xray done persistent leukocytosis EXAM: CT CHEST WITHOUT CONTRAST TECHNIQUE: Multidetector CT imaging of the chest was performed following the standard protocol without IV contrast. COMPARISON:  CT chest 12/29/2020 FINDINGS: Lines and tubes: Right central venous catheter with tip terminating at the superior cavoatrial junction. Cardiovascular: Normal heart size. No significant pericardial effusion. The thoracic aorta is normal in caliber. Likely 2 vessel coronary artery calcifications. Mediastinum/Nodes: No enlarged mediastinal or axillary lymph nodes. Thyroid gland, trachea, and esophagus demonstrate no significant findings. Lungs/Pleura: Elevated right hemidiaphragm. Bilateral lower lobe peribronchovascular consolidations with air bronchograms. No pulmonary nodule. No pulmonary mass. Interval resolution of right pleural effusion. No pleural effusion. No pneumothorax. Upper Abdomen: No acute abnormality. Musculoskeletal: Subcutaneus soft tissue edema along the lateral right upper abdomen. No suspicious lytic or blastic osseous lesions. No acute displaced fracture. IMPRESSION: 1. Bilateral lower lobe peribronchovascular consolidations with air bronchograms. Findings could represent a combination of infection/inflammation versus atelectasis. Limited evaluation on this noncontrast study. 2. Interval resolution of a right pleural effusion. Elevated right hemidiaphragm. 3. Subcutaneus soft  tissue edema along the lateral right upper abdomen. Electronically Signed   By: Iven Finn M.D.   On: 01/17/2021 15:59   US Paracentesis  Result Date: 01/17/2021 INDICATION: Cirrhosis with recurrent ascites. Request received for diagnostic and therapeutic paracentesis. EXAM: ULTRASOUND GUIDED PARACENTESIS MEDICATIONS: Local 1% lidocaine only. COMPLICATIONS: None immediate. PROCEDURE: Informed written consent was obtained from the patient after a discussion of the risks, benefits and alternatives to treatment. A timeout was performed prior to the initiation of the procedure. Initial ultrasound scanning demonstrates a moderate amount of ascites within the right lower abdominal quadrant. The right lower abdomen was prepped and draped in the usual sterile fashion. 1% lidocaine was used for local anesthesia. Following this, a 19 gauge, 10-cm, Yueh catheter was introduced. An ultrasound image was saved for documentation purposes. The paracentesis was performed. The catheter was removed and a dressing was applied. The patient tolerated the procedure well without immediate post procedural complication. FINDINGS: A total of approximately 1800 mL of yellow-colored fluid was  removed. Samples were sent to the laboratory as requested by the clinical team. IMPRESSION: Successful ultrasound-guided paracentesis yielding 1.8 liters of peritoneal fluid. Read By: Tsosie Billing PA-C Electronically Signed   By: Lucrezia Europe M.D.   On: 01/17/2021 12:39        Scheduled Meds:  budesonide  9 mg Oral Daily   enoxaparin (LOVENOX) injection  0.5 mg/kg Subcutaneous Q24H   feeding supplement  237 mL Oral TID BM   FLUoxetine  80 mg Oral Daily   fluticasone  1 spray Each Nare Daily   folic acid  1 mg Oral Daily   furosemide  40 mg Oral BID   insulin aspart  0-20 Units Subcutaneous TID WC   insulin aspart  0-5 Units Subcutaneous QHS   insulin aspart  12 Units Subcutaneous TID WC   insulin glargine-yfgn  28 Units  Subcutaneous BID   magnesium oxide  400 mg Oral BID   multivitamin with minerals  1 tablet Oral Daily   OLANZapine  5 mg Oral QHS   pantoprazole  40 mg Oral BID   pregabalin  75 mg Oral TID   rifaximin  550 mg Oral BID   spironolactone  100 mg Oral Daily   thiamine  100 mg Oral Daily   Vitamin D (Ergocalciferol)  50,000 Units Oral Q7 days   Continuous Infusions:  sodium chloride 10 mL/hr at 01/19/21 0416   piperacillin-tazobactam (ZOSYN)  IV 3.375 g (01/19/21 0538)     LOS: 32 days    Time spent: 25 minutes    Sidney Ace, MD [facility]   If 7PM-7AM, please contact night-coverage  01/19/2021, 10:32 AM

## 2021-01-19 NOTE — Progress Notes (Signed)
PT Cancellation Note  Patient Details Name: [name] MRN: [id number] DOB: 21-Jan-1976   Cancelled Treatment:     PT attempt. Pt is leaving floor for central line removal. Will hold PT this afternoon and return tomorrow. Pt states she did participate earlier with OT and is eager to attempt OOB to chair tomorrow.     Rushie Chestnut 01/19/2021, 1:21 PM

## 2021-01-20 DIAGNOSIS — J690 Pneumonitis due to inhalation of food and vomit: Secondary | ICD-10-CM | POA: Diagnosis not present

## 2021-01-20 DIAGNOSIS — F1020 Alcohol dependence, uncomplicated: Secondary | ICD-10-CM | POA: Diagnosis not present

## 2021-01-20 DIAGNOSIS — D72829 Elevated white blood cell count, unspecified: Secondary | ICD-10-CM | POA: Diagnosis not present

## 2021-01-20 LAB — BASIC METABOLIC PANEL
Anion gap: 11 (ref 5–15)
BUN: 24 mg/dL — ABNORMAL HIGH (ref 6–20)
CO2: 32 mmol/L (ref 22–32)
Calcium: 8.5 mg/dL — ABNORMAL LOW (ref 8.9–10.3)
Chloride: 94 mmol/L — ABNORMAL LOW (ref 98–111)
Creatinine, Ser: 0.7 mg/dL (ref 0.44–1.00)
GFR, Estimated: >60 mL/min (ref >60)
Glucose, Bld: 131 mg/dL — ABNORMAL HIGH (ref 70–99)
Potassium: 3.3 mmol/L — ABNORMAL LOW (ref 3.5–5.1)
Sodium: 137 mmol/L (ref 135–145)

## 2021-01-20 LAB — GLUCOSE, CAPILLARY
Glucose-Capillary: 116 mg/dL — ABNORMAL HIGH (ref 70–99)
Glucose-Capillary: 255 mg/dL — ABNORMAL HIGH (ref 70–99)
Glucose-Capillary: 246 mg/dL — ABNORMAL HIGH (ref 70–99)
Glucose-Capillary: 133 mg/dL — ABNORMAL HIGH (ref 70–99)

## 2021-01-20 LAB — CBC WITH DIFFERENTIAL/PLATELET
Abs Immature Granulocytes: 0.82 10*3/uL — ABNORMAL HIGH (ref 0.00–0.07)
Basophils Absolute: 0.1 10*3/uL (ref 0.0–0.1)
Basophils Relative: 0 %
Eosinophils Absolute: 0.4 10*3/uL (ref 0.0–0.5)
Eosinophils Relative: 1 %
HCT: 32.9 % — ABNORMAL LOW (ref 36.0–46.0)
Hemoglobin: 9.9 g/dL — ABNORMAL LOW (ref 12.0–15.0)
Immature Granulocytes: 3 %
Lymphocytes Relative: 10 %
Lymphs Abs: 3.1 10*3/uL (ref 0.7–4.0)
MCH: 34.3 pg — ABNORMAL HIGH (ref 26.0–34.0)
MCHC: 30.1 g/dL (ref 30.0–36.0)
MCV: 113.8 fL — ABNORMAL HIGH (ref 80.0–100.0)
Monocytes Absolute: 1 10*3/uL (ref 0.1–1.0)
Monocytes Relative: 3 %
Neutro Abs: 26.8 10*3/uL — ABNORMAL HIGH (ref 1.7–7.7)
Neutrophils Relative %: 83 %
Platelets: 259 10*3/uL (ref 150–400)
RBC: 2.89 MIL/uL — ABNORMAL LOW (ref 3.87–5.11)
RDW: 16 % — ABNORMAL HIGH (ref 11.5–15.5)
Smear Review: NORMAL
WBC: 32.3 10*3/uL — ABNORMAL HIGH (ref 4.0–10.5)
nRBC: 0 % (ref 0.0–0.2)

## 2021-01-20 NOTE — Progress Notes (Signed)
Physical Therapy Treatment Patient Details Name: [name] MRN: [id number] DOB: August 25, 1975 Today's Date: 01/20/2021   History of Present Illness 45 y.o. female with medical history significant for Bipolar disorder, diabetes, alcohol use disorder and polysubstance abuse, FSH muscular dystrophy, with history of frequent falls, last seen by neurology in January 2020, who was brought in by EMS after she fell at home 2 days prior and was unable to get up.    PT Comments    Pt was motivated to participate during the session and put forth good effort throughout. SpO2 was 96% and HR 107 at the beginning of the session and remained WNL on 2L/O2 throughout session. Pt was able to complete all ther ex supine in bed and was motivated to try a stand pivot transfer to the chair. She required supervision with bed mobility. Pt was unable to STS, but was still willing to complete mod assist lateral scoot transfer over to chair. Demanded to only be in chair for 2 hours, so OT was notified. Pt will benefit from PT services in a SNF setting upon discharge to safely address deficits listed in patient problem list for decreased caregiver assistance and eventual return to PLOF.       Recommendations for follow up therapy are one component of a multi-disciplinary discharge planning process, led by the attending physician.  Recommendations may be updated based on patient status, additional functional criteria and insurance authorization.  Follow Up Recommendations  Skilled nursing-short term rehab (<3 hours/day)     Assistance Recommended at Discharge Frequent or constant Supervision/Assistance  Equipment Recommendations  None recommended by PT    Recommendations for Other Services       Precautions / Restrictions Precautions Precautions: Fall Restrictions Weight Bearing Restrictions: No     Mobility  Bed Mobility Overal bed mobility: Modified Independent (Used bedrails to sit up) Bed Mobility: Supine  to Sit;Sit to Supine     Supine to sit: Supervision;HOB elevated Sit to supine: Supervision   General bed mobility comments: increased effort/time    Transfers Overall transfer level: Needs assistance Equipment used: Rolling walker (2 wheels) Transfers: Sit to/from Stand;Lateral/Scoot Transfers Sit to Stand: From elevated surface;+2 physical assistance          Lateral/Scoot Transfers: Mod assist General transfer comment: Pt motivated to try stnad pivot transfer, but unable to stand    Ambulation/Gait             General Gait Details: deferred   Stairs             Wheelchair Mobility    Modified Rankin (Stroke Patients Only)       Balance Overall balance assessment: Needs assistance Sitting-balance support: No upper extremity supported;Feet supported Sitting balance-Leahy Scale: Good   Postural control: Left lateral lean Standing balance support: Bilateral upper extremity supported;During functional activity;Reliant on assistive device for balance (but unable to fully stand) Standing balance-Leahy Scale: Poor                              Cognition Arousal/Alertness: Awake/alert Behavior During Therapy: WFL for tasks assessed/performed Overall Cognitive Status: Within Functional Limits for tasks assessed                                          Exercises Total Joint Exercises Ankle Circles/Pumps: AROM;Strengthening;Both;10 reps;Supine Quad Sets:  AROM;Strengthening;Both;10 reps;Supine Gluteal Sets: AROM;Strengthening;Both;10 reps;Supine Long Arc Quad: AROM;Both;Seated (Only completed 1 on both sides) Other Exercises Other Exercises: Pt educated on physiological benefits of sitting in chair    General Comments        Pertinent Vitals/Pain Pain Assessment: No/denies pain    Home Living                          Prior Function            PT Goals (current goals can now be found in the care plan  section) Acute Rehab PT Goals Patient Stated Goal: to get stronger and stand again PT Goal Formulation: With patient Time For Goal Achievement: 02/01/21 Potential to Achieve Goals: Fair Progress towards PT goals: Progressing toward goals    Frequency    Min 2X/week      PT Plan Current plan remains appropriate    Co-evaluation              AM-PAC PT "6 Clicks" Mobility   Outcome Measure  Help needed turning from your back to your side while in a flat bed without using bedrails?: A Little Help needed moving from lying on your back to sitting on the side of a flat bed without using bedrails?: A Little Help needed moving to and from a bed to a chair (including a wheelchair)?: A Lot Help needed standing up from a chair using your arms (e.g., wheelchair or bedside chair)?: A Lot Help needed to walk in hospital room?: Total Help needed climbing 3-5 steps with a railing? : Total 6 Click Score: 12    End of Session Equipment Utilized During Treatment: Gait belt Activity Tolerance: Patient limited by fatigue;Patient tolerated treatment well (Wanted to conserve energy) Patient left: in chair;with call bell/phone within reach Nurse Communication: Mobility status PT Visit Diagnosis: Muscle weakness (generalized) (M62.81);Difficulty in walking, not elsewhere classified (R26.2);Unsteadiness on feet (R26.81)     Time: 1040-1059 (and 11:05-11:19, break needed for toileting) PT Time Calculation (min) (ACUTE ONLY): 19 min  Charges:                        Hildred Alamin SPT 01/20/21, 1:20 PM

## 2021-01-20 NOTE — TOC Progression Note (Signed)
Transition of Care [facility]) - Progression Note    Patient Details  Name: [name] MRN: [id number] Date of Birth: 1976/02/15  [facility]) CM/SW Contact  Margarito Liner, LCSW Phone Number: 01/20/2021, 12:13 PM  Clinical Narrative:   Updated patient.  Expected Discharge Plan: Skilled Nursing Facility Barriers to Discharge: Continued Medical Work up  Expected Discharge Plan and Services Expected Discharge Plan: Skilled Nursing Facility     Post Acute Care Choice: Skilled Nursing Facility Living arrangements for the past 2 months: Single Family Home                                       Social Determinants of Health (SDOH) Interventions    Readmission Risk Interventions No flowsheet data found.

## 2021-01-20 NOTE — Progress Notes (Signed)
Date of Admission:  12/17/2020    ID: [name] is a 45 y.o. female    Principal Problem:   Alcohol use disorder, moderate, dependence ([place]) Active Problems:   Alcohol withdrawal ([place])   Bipolar 2 disorder ([place])   Hypokalemia   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[place])   Acute hyponatremia   Hepatic steatosis   Polysubstance abuse ([name])   Hyperglycemia due to type 2 diabetes mellitus ([name])   Hyperbilirubinemia   Lactic acidosis   Alcoholic liver disease ([name])   Sepsis ([place])   Acute hepatic encephalopathy   Pressure injury of skin   Aspiration pneumonia of both lower lobes due to gastric secretions ([name])    Subjective: Pt doing well No cough Some sob without oxygen Able to work more with PT No fever or chills  Medications:   amoxicillin-clavulanate  1 tablet Oral Q12H   budesonide  9 mg Oral Daily   enoxaparin (LOVENOX) injection  0.5 mg/kg Subcutaneous Q24H   feeding supplement  237 mL Oral TID BM   FLUoxetine  80 mg Oral Daily   fluticasone  1 spray Each Nare Daily   folic acid  1 mg Oral Daily   furosemide  40 mg Oral BID   insulin aspart  0-20 Units Subcutaneous TID WC   insulin aspart  0-5 Units Subcutaneous QHS   insulin aspart  12 Units Subcutaneous TID WC   insulin glargine-yfgn  28 Units Subcutaneous BID   magnesium oxide  400 mg Oral BID   multivitamin with minerals  1 tablet Oral Daily   OLANZapine  5 mg Oral QHS   pantoprazole  40 mg Oral BID   pregabalin  75 mg Oral TID   rifaximin  550 mg Oral BID   spironolactone  100 mg Oral Daily   thiamine  100 mg Oral Daily   Vitamin D (Ergocalciferol)  50,000 Units Oral Q7 days    Objective: Vital signs in last 24 hours: Temp:  [97.7 F (36.5 C)-98.1 F (36.7 C)] 98.1 F (36.7 C) (11/04 1513) Pulse Rate:  [97-107] 104 (11/04 1513) Resp:  [16-20] 16 (11/04 1513) BP: (107-120)/(67-74) 120/72 (11/04 1513) SpO2:  [96 %-98 %] 97 % (11/04 1513) Weight:  [98.3 kg] 98.3 kg (11/04  0548)  PHYSICAL EXAM:  General: Alert, cooperative, no distress, appears stated age. Nasal cannula Head: Normocephalic, without obvious abnormality, atraumatic. Eyes: Conjunctivae clear, anicteric sclerae. Pupils are equal ENT Nares normal. No drainage or sinus tenderness. Lips, mucosa, and tongue normal. No Thrush She cannot pucker her lips to do incentive spirometry   Neck: Supple, symmetrical, no adenopathy, thyroid: non tender Rt IJ line no carotid bruit and no JVD. Back: No CVA tenderness. Lungs: b/l air entry- few basal crepts Heart: irregular Neurologic: weakness upper body   Lab Results Recent Labs    01/19/21 0840 01/20/21 0841  WBC 35.7* 32.3*  HGB 8.5* 9.9*  HCT 27.5* 32.9*  NA 134* 137  K 3.6 3.3*  CL 90* 94*  CO2 34* 32  BUN 21* 24*  CREATININE 0.69 0.70   Microbiology: 01/09/2021 urine culture 30,000 colonies of mixed species 01/06/2021 abdominal fluid culture no growth 01/04/2021 blood cultures no growth  10-22 blood culture no growth  01/04/2021 catheter tip no growth Studies/Results: IR Removal Tun Cv Cath W/O FL  Result Date: 01/19/2021 CLINICAL DATA:  Status post tunneled central venous catheter placement via right internal jugular vein on 12/30/2020. Due to leukocytosis, request has been made to remove the  catheter. EXAM: REMOVAL OF TUNNELED CENTRAL VENOUS CATHETER PROCEDURE: The right chest tunneled central venous catheter site was prepped with chlorhexidine. A sterile gown and gloves were worn during the procedure. A retention suture was cut. The subcutaneous cuff of the dialysis catheter was freed by manual traction. The catheter was then successfully removed in its entirety. A sterile dressing was applied over the catheter exit site. IMPRESSION: Removal of tunneled central venous catheter. The procedure was uncomplicated. Electronically Signed   By: Irish Lack M.D.   On: 01/19/2021 14:28     Assessment/Plan:  Alcoholic hepatitis with  ascites S/p paracentesis X 34 No SPB  Worsening leucocytosis but clinically improving and does not look septic CXR done  shows b/l lower lobe infiltrate CT chest done shows b/l infiltrate  This looks more like atelectais rather than pneumonic consolidation Doubt this is the cause of leucocytosis Central line has been removed- and leucocytosis trending down  Paracentesis no SBP Zozyn started on 01/17/21 Discontinued 01/19/21- on PO augmentin   Acute hepatic encephalopathy- resolved   DM- poorly controlled   Fascioscapulo humeral muscular dystrophy   Pt on budesonide oral capsule 9mg  /day- microscopic colitis  Discussed the management with the patient ID will follow her peripherally this weekend

## 2021-01-20 NOTE — Progress Notes (Signed)
PROGRESS NOTE    [name]  ZYS:[id number] DOB: 06-16-1975 DOA: 12/17/2020 PCP: Eunice Blase, PA-C    Brief Narrative:  45 y.o. female with medical history significant for bipolar disorder, type 2 diabetes mellitus, alcohol use disorder, polysubstance use disorder, FSHD muscular dystrophy, history of falls, microscopic colitis on budesonide, who was brought to the hospital because of a fall and inability to get up on her own.  She was found to have sepsis secondary to aspiration pneumonia, severe hyponatremia with sodium level of 113, hypokalemia with potassium of 2.9, hyperbilirubinemia with bilirubin of 11.9 and elevated ammonia of 52.   She was treated with empiric IV antibiotics and IV fluids.  Magnesium, phosphorus and potassium were repleted.  She was placed on 2 L/min oxygen for acute hypoxemic respiratory failure.  She remains on 2 L/min oxygen for chronic hypoxemic respiratory failure.  She was given lactulose and rifaximin for hepatic encephalopathy.  She underwent paracentesis for ascites.  There was no evidence of SBP.  After completing antibiotic therapy (Unasyn and Zyvox), she continued to have persistent leukocytosis and she was found to have UTI.  ID was consulted to assist with management.  11/2: Uptrending leukocytosis.  Downtrending procalcitonin.  T-max 100.2 over the last 24 hours. 11/3: Patient feels well.  Procalcitonin downtrending.  Leukocytosis slight downtrend but persistent.  No fevers or minimal   Assessment & Plan:   Principal Problem:   Alcohol use disorder, moderate, dependence ([name]) Active Problems:   Alcohol withdrawal ([name])   Bipolar 2 disorder ([name])   Hypokalemia   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[name])   Acute hyponatremia   Hepatic steatosis   Polysubstance abuse ([name])   Hyperglycemia due to type 2 diabetes mellitus ([name])   Hyperbilirubinemia   Lactic acidosis   Alcoholic liver disease ([name])   Sepsis ([name])   Acute hepatic  encephalopathy   Pressure injury of skin   Aspiration pneumonia of both lower lobes due to gastric secretions ([name])  Sepsis secondary to aspiration pneumonia Persistent severe leukocytosis Patient has completed course of IV cefepime on 10/26 All blood cells remain elevated Paracentesis 11/1, negative for SBP Possible aspiration on chest imaging Infectious disease consulted Central line discontinued 11/3 White blood cell count decreasing Plan: Daily CBC with differential Empiric Augmentin  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  Educated at bedside  Bipolar disorder Depression Appears stable Continue current psychiatric regimen  Type 2 diabetes mellitus with hyperglycemia Semglee 28 units twice daily NovoLog 12 units 3 times daily with meals Carb modified diet Diabetes coordinator consult   DVT prophylaxis: SQ Lovenox Code Status: Full Family Communication: None Disposition Plan: Status is: Inpatient  Remains inpatient appropriate because: Marked leukocytosis of unclear etiology.  Will discharge to skilled nursing facility in [place].  Disposition date pending     Level of care: Med-Surg  Consultants:  ID  Procedures:  None  Antimicrobials: Augmentin   Subjective: Patient seen and examined.  Resting comfortably in bed.  No visible distress.  Answers all questions appropriately.  No pain complaints.  Objective: Vitals:   01/19/21 2125 01/20/21 0548 01/20/21 0604 01/20/21 0752  BP: 107/67  112/70 115/74  Pulse: 97  (!) 102 (!) 107  Resp:  18  20 17   Temp: 97.8 F (36.6  C)  97.7 F (36.5 C) 98.1 F (36.7 C)  TempSrc: Oral  Oral Oral  SpO2: 97%  96% 98%  Weight:  98.3 kg    Height:        Intake/Output Summary (Last 24 hours) at 01/20/2021 1235 Last data filed at 01/20/2021 1000 Gross per 24 hour  Intake 360 ml  Output 1100 ml  Net -740 ml   Filed Weights   01/11/21 0500 01/12/21 0353 01/20/21 0548  Weight: 108.4 kg 107.1 kg 98.3 kg    Examination:  General exam: No apparent distress Respiratory system: Bibasilar crackles.  Normal work of breathing.  2 L Cardiovascular system: S1-S2, RRR, no murmurs, no pedal edema Gastrointestinal system: Soft, NT/ND, normal bowel sounds Central nervous system: Alert and oriented. No focal neurological deficits. Extremities: Symmetric 5 x 5 power. Skin: No rashes, lesions or ulcers Psychiatry: Judgement and insight appear normal. Mood & affect appropriate.     Data Reviewed: I have personally reviewed following labs and imaging studies  CBC: Recent Labs  Lab 01/16/21 0438 01/17/21 0606 01/18/21 0510 01/19/21 0840 01/20/21 0841  WBC 32.8* 35.2* 36.9* 35.7* 32.3*  NEUTROABS 27.2* 29.7* 31.7* 30.3* 26.8*  HGB 9.1* 9.4* 9.1* 8.5* 9.9*  HCT 29.5* 29.3* 28.6* 27.5* 32.9*  MCV 112.2* 111.8* 111.3* 110.4* 113.8*  PLT 255 251 260 245 [id number]   Basic Metabolic Panel: Recent Labs  Lab 01/15/21 0412 01/16/21 0438 01/17/21 0606 01/18/21 0510 01/19/21 0840 01/20/21 0841  NA 134*  --  135 136 134* 137  K 4.1  --  4.1 3.9 3.6 3.3*  CL 93*  --  93* 93* 90* 94*  CO2 34*  --  34* 35* 34* 32  GLUCOSE 181*  --  125* 231* 107* 131*  BUN 19  --  22* 22* 21* 24*  CREATININE 0.63  --  0.65 0.58 0.69 0.70  CALCIUM 8.4*  --  8.3* 8.3* 7.8* 8.5*  MG 1.5* 1.5* 1.6* 1.7  --   --    GFR: Estimated Creatinine Clearance: 108.1 mL/min (by C-G formula based on SCr of 0.7 mg/dL). Liver Function Tests: Recent Labs  Lab 01/15/21 0412  AST 126*  ALT 29  ALKPHOS 272*  BILITOT 6.9*  PROT 6.6  ALBUMIN <1.5*   No  results for input(s): LIPASE, AMYLASE in the last 168 hours. No results for input(s): AMMONIA in the last 168 hours. Coagulation Profile: No results for input(s): INR, PROTIME in the last 168 hours. Cardiac Enzymes: No results for input(s): CKTOTAL, CKMB, CKMBINDEX, TROPONINI in the last 168 hours. BNP (last 3 results) No results for input(s): PROBNP in the last 8760 hours. HbA1C: No results for input(s): HGBA1C in the last 72 hours. CBG: Recent Labs  Lab 01/19/21 1150 01/19/21 1609 01/19/21 2126 01/20/21 0753 01/20/21 1128  GLUCAP 210* 187* 141* 116* 255*   Lipid Profile: No results for input(s): CHOL, HDL, LDLCALC, TRIG, CHOLHDL, LDLDIRECT in the last 72 hours. Thyroid Function Tests: No results for input(s): TSH, T4TOTAL, FREET4, T3FREE, THYROIDAB in the last 72 hours. Anemia Panel: No results for input(s): VITAMINB12, FOLATE, FERRITIN, TIBC, IRON, RETICCTPCT in the last 72 hours. Sepsis Labs: Recent Labs  Lab 01/17/21 0606 01/18/21 0510 01/19/21 0541  PROCALCITON 2.24 2.06 1.95    Recent Results (from the past 240 hour(s))  Body fluid culture w Gram Stain     Status: None (Preliminary result)   Collection Time: 01/17/21 11:15 AM  Specimen: PATH Cytology Peritoneal fluid  Result Value Ref Range Status   Specimen Description   Final    PERITONEAL Performed at [facility], [address] [place], [place] [address]    Special Requests   Final    NONE Performed at [facility], [place], [place] [address]    Gram Stain NO WBC SEEN NO ORGANISMS SEEN   Final   Culture   Final    NO GROWTH 3 DAYS Performed at [facility], [place] [address] [place], [place] [address]    Report Status PENDING  Incomplete  MRSA Next Gen by PCR, Nasal     Status: None   Collection Time: 01/17/21  4:07 PM   Specimen: Nasal Mucosa; Nasal Swab  Result Value Ref Range Status   MRSA by PCR Next Gen NOT DETECTED NOT DETECTED Final    Comment:  (NOTE) The GeneXpert MRSA Assay (FDA approved for NASAL specimens only), is one component of a comprehensive MRSA colonization surveillance program. It is not intended to diagnose MRSA infection nor to guide or monitor treatment for MRSA infections. Test performance is not FDA approved in patients less than 11 years old. Performed at [facility], [address] [place],  [address]          Radiology Studies: IR Removal Tun Cv Cath W/O FL  Result Date: 01/19/2021 CLINICAL DATA:  Status post tunneled central venous catheter placement via right internal jugular vein on 12/30/2020. Due to leukocytosis, request has been made to remove the catheter. EXAM: REMOVAL OF TUNNELED CENTRAL VENOUS CATHETER PROCEDURE: The right chest tunneled central venous catheter site was prepped with chlorhexidine. A sterile gown and gloves were worn during the procedure. A retention suture was cut. The subcutaneous cuff of the dialysis catheter was freed by manual traction. The catheter was then successfully removed in its entirety. A sterile dressing was applied over the catheter exit site. IMPRESSION: Removal of tunneled central venous catheter. The procedure was uncomplicated. Electronically Signed   By: Aletta Edouard M.D.   On: 01/19/2021 14:28        Scheduled Meds:  amoxicillin-clavulanate  1 tablet Oral Q12H   budesonide  9 mg Oral Daily   enoxaparin (LOVENOX) injection  0.5 mg/kg Subcutaneous Q24H   feeding supplement  237 mL Oral TID BM   FLUoxetine  80 mg Oral Daily   fluticasone  1 spray Each Nare Daily   folic acid  1 mg Oral Daily   furosemide  40 mg Oral BID   insulin aspart  0-20 Units Subcutaneous TID WC   insulin aspart  0-5 Units Subcutaneous QHS   insulin aspart  12 Units Subcutaneous TID WC   insulin glargine-yfgn  28 Units Subcutaneous BID   magnesium oxide  400 mg Oral BID   multivitamin with minerals  1 tablet Oral Daily   OLANZapine  5 mg Oral QHS    pantoprazole  40 mg Oral BID   pregabalin  75 mg Oral TID   rifaximin  550 mg Oral BID   spironolactone  100 mg Oral Daily   thiamine  100 mg Oral Daily   Vitamin D (Ergocalciferol)  50,000 Units Oral Q7 days   Continuous Infusions:  sodium chloride 10 mL/hr at 01/19/21 0416     LOS: 33 days    Time spent: 15 minutes    Sidney Ace, MD [facility]   If 7PM-7AM, please contact night-coverage  01/20/2021,  12:35 PM

## 2021-01-20 NOTE — Progress Notes (Signed)
Occupational Therapy Treatment Patient Details Name: [name] MRN: [id number] DOB: 03-Oct-1975 Today's Date: 01/20/2021   History of present illness 45 y.o. female with medical history significant for Bipolar disorder, diabetes, alcohol use disorder and polysubstance abuse, FSH muscular dystrophy, with history of frequent falls, last seen by neurology in January 2020, who was brought in by EMS after she fell at home 2 days prior and was unable to get up.   OT comments  Pt seen for OT Tx this date to f/u re: safety with ADLs/ADL mobility. Pt seated in recliner when OT presents and reports pain in R LE from sitting. Pt unsuccessful in full clearing bottom for STS with RW, but pt is able to clear ~4-5" from recliner seat (lower surface than previously attempted) with MAX A of 1 with RW. opted for scoot transfer back to bed in which pt only requires CGA/MIN A. OT engages pt in UB dressing with SETUP to MIN A. Pt in bed end of session with all needs met and in reach. OT will continue to follow as pt continues to make progress and benefit from services.    Recommendations for follow up therapy are one component of a multi-disciplinary discharge planning process, led by the attending physician.  Recommendations may be updated based on patient status, additional functional criteria and insurance authorization.    Follow Up Recommendations  Skilled nursing-short term rehab (<3 hours/day)    Assistance Recommended at Discharge    Equipment Recommendations  [facility]    Recommendations for Other Services      Precautions / Restrictions Precautions Precautions: Fall Restrictions Weight Bearing Restrictions: No       Mobility Bed Mobility Overal bed mobility: Modified Independent         Sit to supine: Supervision   General bed mobility comments: increased effort/time    Transfers Overall transfer level: Needs assistance Equipment used: Rolling walker (2 wheels) Transfers: Sit to/from  Stand;Lateral/Scoot Transfers Sit to Stand: Max assist          Lateral/Scoot Transfers: Min guard;Min assist General transfer comment: unsuccessful in full clearing bottom for STS with RW, but pt is able to clear ~4-5" from recliner seat (lower surface than previously attempted) with MAX A of 1 with RW. opted for scoot transfer back to bed in which pt only requires CGA/MIN A     Balance Overall balance assessment: Needs assistance   Sitting balance-Leahy Scale: Good                                     ADL either performed or assessed with clinical judgement   ADL Overall ADL's : Needs assistance/impaired                 Upper Body Dressing : Sitting;Set up;Supervision/safety                           Vision       Perception     Praxis      Cognition Arousal/Alertness: Awake/alert Behavior During Therapy: WFL for tasks assessed/performed Overall Cognitive Status: Within Functional Limits for tasks assessed                                            Exercises Other  Exercises Other Exercises: OT engages pt in dressing tasks and lateral scoot transfer.   Shoulder Instructions       General Comments      Pertinent Vitals/ Pain       Pain Assessment: 0-10 Pain Score: 6  Pain Location: R LE going numb from sitting up in chair Pain Descriptors / Indicators: Discomfort Pain Intervention(s): Repositioned;Monitored during session  Home Living                                          Prior Functioning/Environment              Frequency  Min 2X/week        Progress Toward Goals  OT Goals(current goals can now be found in the care plan section)  Progress towards OT goals: Progressing toward goals  Acute Rehab OT Goals OT Goal Formulation: With patient Time For Goal Achievement: 01/30/21 Potential to Achieve Goals: Good  Plan Discharge plan remains appropriate;Frequency remains  appropriate    Co-evaluation                 AM-PAC OT "6 Clicks" Daily Activity     Outcome Measure   Help from another person eating meals?: None Help from another person taking care of personal grooming?: None Help from another person toileting, which includes using toliet, bedpan, or urinal?: A Lot Help from another person bathing (including washing, rinsing, drying)?: A Little Help from another person to put on and taking off regular upper body clothing?: A Little Help from another person to put on and taking off regular lower body clothing?: A Lot 6 Click Score: 18    End of Session Equipment Utilized During Treatment: Oxygen  OT Visit Diagnosis: Unsteadiness on feet (R26.81);Muscle weakness (generalized) (M62.81);History of falling (Z91.81)   Activity Tolerance Patient tolerated treatment well   Patient Left in bed;with call bell/phone within reach;with bed alarm set;with family/visitor present   Nurse Communication          Time: 3557-3220 OT Time Calculation (min): 24 min  Charges: OT General Charges $OT Visit: 1 Visit OT Treatments $Self Care/Home Management : 8-22 mins $Therapeutic Activity: 8-22 mins  Gerrianne Scale, [place], OTR/L ascom [phone] 01/20/21, 6:19 PM

## 2021-01-20 NOTE — Plan of Care (Signed)
Continuing with plan of care. 

## 2021-01-21 ENCOUNTER — Inpatient Hospital Stay: Payer: Medicare Other

## 2021-01-21 DIAGNOSIS — D72829 Elevated white blood cell count, unspecified: Secondary | ICD-10-CM | POA: Diagnosis not present

## 2021-01-21 DIAGNOSIS — J690 Pneumonitis due to inhalation of food and vomit: Secondary | ICD-10-CM | POA: Diagnosis not present

## 2021-01-21 LAB — GLUCOSE, CAPILLARY
Glucose-Capillary: 159 mg/dL — ABNORMAL HIGH (ref 70–99)
Glucose-Capillary: 137 mg/dL — ABNORMAL HIGH (ref 70–99)
Glucose-Capillary: 103 mg/dL — ABNORMAL HIGH (ref 70–99)
Glucose-Capillary: 259 mg/dL — ABNORMAL HIGH (ref 70–99)

## 2021-01-21 LAB — BODY FLUID CULTURE W GRAM STAIN
Culture: NO GROWTH
Gram Stain: NONE SEEN

## 2021-01-21 LAB — CBC WITH DIFFERENTIAL/PLATELET
Abs Immature Granulocytes: 0.61 10*3/uL — ABNORMAL HIGH (ref 0.00–0.07)
Basophils Absolute: 0.2 10*3/uL — ABNORMAL HIGH (ref 0.0–0.1)
Basophils Relative: 0 %
Eosinophils Absolute: 0.4 10*3/uL (ref 0.0–0.5)
Eosinophils Relative: 1 %
HCT: 28 % — ABNORMAL LOW (ref 36.0–46.0)
Hemoglobin: 8.6 g/dL — ABNORMAL LOW (ref 12.0–15.0)
Immature Granulocytes: 2 %
Lymphocytes Relative: 10 %
Lymphs Abs: 3.4 10*3/uL (ref 0.7–4.0)
MCH: 34 pg (ref 26.0–34.0)
MCHC: 30.7 g/dL (ref 30.0–36.0)
MCV: 110.7 fL — ABNORMAL HIGH (ref 80.0–100.0)
Monocytes Absolute: 1.2 10*3/uL — ABNORMAL HIGH (ref 0.1–1.0)
Monocytes Relative: 3 %
Neutro Abs: 28.8 10*3/uL — ABNORMAL HIGH (ref 1.7–7.7)
Neutrophils Relative %: 84 %
Platelets: 277 10*3/uL (ref 150–400)
RBC: 2.53 MIL/uL — ABNORMAL LOW (ref 3.87–5.11)
RDW: 16 % — ABNORMAL HIGH (ref 11.5–15.5)
Smear Review: NORMAL
WBC: 34.5 10*3/uL — ABNORMAL HIGH (ref 4.0–10.5)
nRBC: 0.1 % (ref 0.0–0.2)

## 2021-01-21 MED ORDER — FUROSEMIDE 10 MG/ML IJ SOLN
40.0000 mg | Freq: Two times a day (BID) | INTRAMUSCULAR | Status: AC
  Administered 2021-01-21 (×2): 40 mg via INTRAVENOUS
  Filled 2021-01-21 (×2): qty 4

## 2021-01-21 MED ORDER — ENOXAPARIN SODIUM 60 MG/0.6ML IJ SOSY
0.5000 mg/kg | PREFILLED_SYRINGE | INTRAMUSCULAR | Status: DC
  Administered 2021-01-21 – 2021-01-25 (×5): 50 mg via SUBCUTANEOUS
  Filled 2021-01-21 (×6): qty 0.6

## 2021-01-21 NOTE — Plan of Care (Signed)
Continuing with plan of care. 

## 2021-01-21 NOTE — Progress Notes (Signed)
   01/21/21 0430 01/21/21 0447  Assess: MEWS Score  Pulse Rate (!) 124 (!) 121  SpO2  --  97 %  Assess: MEWS Score  MEWS Temp 0 0  MEWS Systolic 0 0  MEWS Pulse 2 2  MEWS RR 0 0  MEWS LOC 0 0  MEWS Score 2 2  MEWS Score Color Yellow Yellow  Assess: if the MEWS score is Yellow or Red  Were vital signs taken at a resting state?  --  Yes  Focused Assessment  --  Change from prior assessment (see assessment flowsheet)  Does the patient meet 2 or more of the SIRS criteria?  --  No  MEWS guidelines implemented *See Row Information*  --  Yes  Treat  MEWS Interventions  --  Other (Comment) (Notified Steward Drone NP)  Pain Scale  --  0-10  Pain Score  --  0  Take Vital Signs  Increase Vital Sign Frequency   --  Yellow: Q 2hr X 2 then Q 4hr X 2, if remains yellow, continue Q 4hrs  Escalate  MEWS: Escalate  --  Yellow: discuss with charge nurse/RN and consider discussing with provider and RRT  Notify: Charge Nurse/RN  Name of Charge Nurse/RN Notified  --  Dorathy Daft  Date Charge Nurse/RN Notified  --  01/21/21  Time Charge Nurse/RN Notified  --  0450  Notify: Provider  Provider Name/Title  --  Manuela Schwartz NP  Date Provider Notified  --  01/21/21  Time Provider Notified  --  [phone]  Notification Type  --   (Secure chat)  Notification Reason  --  Change in status (elevated heart rate)  Provider response  --  Other (Comment) (awaiting a response)  Date of Provider Response  --  01/21/21  Time of Provider Response  --   (awaiting a response)  Notify: Rapid Response  Name of Rapid Response RN Notified  --  Did not notified RR  Assess: SIRS CRITERIA  SIRS Temperature  0 0  SIRS Pulse 1 1  SIRS Respirations  0 0  SIRS WBC 0 0  SIRS Score Sum  1 1  Rechecked heart rate , remained elevated in the 120's. Notified Brenda NP of elevated heart rate. Awaiting response/ new orders. Anselm Jungling

## 2021-01-21 NOTE — Progress Notes (Signed)
PROGRESS NOTE    MARCINDA LEAVERTON  [id number] DOB: 1976/01/28 DOA: 12/17/2020 PCP: Janine Limbo, PA-C    Brief Narrative:  45 y.o. female with medical history significant for bipolar disorder, type 2 diabetes mellitus, alcohol use disorder, polysubstance use disorder, FSHD muscular dystrophy, history of falls, microscopic colitis on budesonide, who was brought to the hospital because of a fall and inability to get up on her own.  She was found to have sepsis secondary to aspiration pneumonia, severe hyponatremia with sodium level of 113, hypokalemia with potassium of 2.9, hyperbilirubinemia with bilirubin of 11.9 and elevated ammonia of 52.   She was treated with empiric IV antibiotics and IV fluids.  Magnesium, phosphorus and potassium were repleted.  She was placed on 2 L/min oxygen for acute hypoxemic respiratory failure.  She remains on 2 L/min oxygen for chronic hypoxemic respiratory failure.  She was given lactulose and rifaximin for hepatic encephalopathy.  She underwent paracentesis for ascites.  There was no evidence of SBP.  After completing antibiotic therapy (Unasyn and Zyvox), she continued to have persistent leukocytosis and she was found to have UTI.  ID was consulted to assist with management.  11/2: Uptrending leukocytosis.  Downtrending procalcitonin.  T-max 100.2 over the last 24 hours. 11/3: Patient feels well.  Procalcitonin downtrending.  Leukocytosis slight downtrend but persistent.  No fevers or minimal 11/5: Persistent leukocytosis.  Slight uptrend.  Patient had tachycardia and increased oxygen demand over interval.   Assessment & Plan:   Principal Problem:   Alcohol use disorder, moderate, dependence ([name]) Active Problems:   Alcohol withdrawal ([name])   Bipolar 2 disorder ([name])   Hypokalemia   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[place])   Acute hyponatremia   Hepatic steatosis   Polysubstance abuse ([name])   Hyperglycemia due to type 2 diabetes mellitus  ([name])   Hyperbilirubinemia   Lactic acidosis   Alcoholic liver disease ([name])   Sepsis ([place])   Acute hepatic encephalopathy   Pressure injury of skin   Aspiration pneumonia of both lower lobes due to gastric secretions ([name])  Sepsis secondary to aspiration pneumonia Persistent severe leukocytosis Patient has completed course of IV cefepime on 10/26 All blood cells remain elevated Paracentesis 11/1, negative for SBP Possible aspiration on chest imaging Infectious disease consulted Central line discontinued 11/3 11/5: White blood cell count remains markedly elevated Plan: Continue Augmentin Daily CBC with differential  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 Acute hepatic encephalopathy, resolved Hypoalbuminemia Pulmonary hypertension Status post paracentesis x4 this admission Most recent 11/1, negative for SBP No signs of hepatic encephalopathy Worsening ascites noted on exam 11/5 Plan: IV Lasix for today Continue Aldactone Continue rifaximin Monitor for encephalopathy Abdominal ultrasound  Acute on chronic hypoxic respiratory failure Baseline dependent on 2 L, unable to wean Had increasing oxygen demand to 4 L on 11/5 Plan: Lasix 40 mg IV x2 doses Suspect patient will need paracentesis, abdominal ultrasound ordered Encourage regular incentive spirometry use.  Educated at bedside  Bipolar disorder Depression Appears stable Continue current psychiatric regimen  Type 2 diabetes mellitus with hyperglycemia Semglee 28 units twice daily NovoLog 12 units 3 times daily with meals Carb modified diet Diabetes coordinator consult   DVT prophylaxis: SQ Lovenox Code Status: Full Family Communication: None Disposition Plan: Status is: Inpatient  Remains inpatient appropriate because: Marked leukocytosis of unclear etiology.  Recurrent ascites.  Patient will need repeat paracentesis likely on Monday 11/7    Level of care: Med-Surg  Consultants:  ID  Procedures:   None  Antimicrobials: Augmentin   Subjective: Patient seen and examined.  Resting in bed.  No visible distress.  Does endorse abdominal distention.  No other complaints  Objective: Vitals:   01/21/21 0430 01/21/21 0447 01/21/21 0648 01/21/21 0836  BP: 120/68   106/69  Pulse: (!) 124 (!) 121 (!) 110 (!) 112  Resp: 20   16  Temp: 99.7 F (37.6 C)   99.4 F (37.4 C)  TempSrc: Oral   Oral  SpO2: 98% 97% 98% 99%  Weight: 99.1 kg     Height:        Intake/Output Summary (Last 24 hours) at 01/21/2021 1015 Last data filed at 01/21/2021 0240 Gross per 24 hour  Intake 240 ml  Output 1250 ml  Net -1010 ml   Filed Weights   01/12/21 0353 01/20/21 0548 01/21/21 0430  Weight: 107.1 kg 98.3 kg 99.1 kg    Examination:  General exam: No apparent distress Respiratory system: Bibasilar crackles.  Normal work of breathing.  4 L Cardiovascular system: S1-S2, RRR, no murmurs, no pedal edema Gastrointestinal system: Soft, distended with positive fluid wave, decreased bowel sounds Central nervous system: Alert and oriented. No focal neurological deficits. Extremities: Symmetric 5 x 5 power. Skin: No rashes, lesions or ulcers Psychiatry: Judgement and insight appear normal. Mood & affect appropriate.     Data Reviewed: I have personally reviewed following labs and imaging studies  CBC: Recent Labs  Lab 01/17/21 0606 01/18/21 0510 01/19/21 0840 01/20/21 0841 01/21/21 0526  WBC 35.2* 36.9* 35.7* 32.3* 34.5*  NEUTROABS 29.7* 31.7* 30.3* 26.8* 28.8*  HGB 9.4* 9.1* 8.5* 9.9* 8.6*  HCT 29.3* 28.6* 27.5* 32.9* 28.0*  MCV 111.8* 111.3* 110.4* 113.8* 110.7*  PLT 251 260 245 259 [id number]   Basic Metabolic Panel: Recent Labs  Lab 01/15/21 0412 01/16/21 0438 01/17/21 0606 01/18/21 0510 01/19/21 0840 01/20/21 0841  NA 134*  --  135 136 134* 137  K 4.1  --  4.1 3.9 3.6 3.3*  CL 93*  --  93* 93* 90* 94*  CO2 34*  --  34* 35* 34* 32  GLUCOSE 181*  --  125* 231* 107* 131*  BUN 19  --   22* 22* 21* 24*  CREATININE 0.63  --  0.65 0.58 0.69 0.70  CALCIUM 8.4*  --  8.3* 8.3* 7.8* 8.5*  MG 1.5* 1.5* 1.6* 1.7  --   --    GFR: Estimated Creatinine Clearance: 108.5 mL/min (by C-G formula based on SCr of 0.7 mg/dL). Liver Function Tests: Recent Labs  Lab 01/15/21 0412  AST 126*  ALT 29  ALKPHOS 272*  BILITOT 6.9*  PROT 6.6  ALBUMIN <1.5*   No results for input(s): LIPASE, AMYLASE in the last 168 hours. No results for input(s): AMMONIA in the last 168 hours. Coagulation Profile: No results for input(s): INR, PROTIME in the last 168 hours. Cardiac Enzymes: No results for input(s): CKTOTAL, CKMB, CKMBINDEX, TROPONINI in the last 168 hours. BNP (last 3 results) No results for input(s): PROBNP in the last 8760 hours. HbA1C: No results for input(s): HGBA1C in the last 72 hours. CBG: Recent Labs  Lab 01/20/21 0753 01/20/21 1128 01/20/21 1659 01/20/21 2112 01/21/21 0948  GLUCAP 116* 255* 246* 133* 159*   Lipid Profile: No results for input(s): CHOL, HDL, LDLCALC, TRIG, CHOLHDL, LDLDIRECT in the last 72 hours. Thyroid Function Tests: No results for input(s): TSH, T4TOTAL, FREET4, T3FREE, THYROIDAB in the last 72 hours. Anemia Panel: No results for input(s): VITAMINB12, FOLATE, FERRITIN, TIBC,  IRON, RETICCTPCT in the last 72 hours. Sepsis Labs: Recent Labs  Lab 01/17/21 0606 01/18/21 0510 01/19/21 0541  PROCALCITON 2.24 2.06 1.95    Recent Results (from the past 240 hour(s))  Body fluid culture w Gram Stain     Status: None   Collection Time: 01/17/21 11:15 AM   Specimen: PATH Cytology Peritoneal fluid  Result Value Ref Range Status   Specimen Description   Final    PERITONEAL Performed at [facility], [address], [place], [place] [address]    Special Requests   Final    NONE Performed at [facility], [address] [place], [place] [address]    Gram Stain NO WBC SEEN NO ORGANISMS SEEN   Final   Culture   Final    NO  GROWTH 3 DAYS Performed at [facility], [address] [place], [place] [address]    Report Status 01/21/2021 FINAL  Final  MRSA Next Gen by PCR, Nasal     Status: None   Collection Time: 01/17/21  4:07 PM   Specimen: Nasal Mucosa; Nasal Swab  Result Value Ref Range Status   MRSA by PCR Next Gen NOT DETECTED NOT DETECTED Final    Comment: (NOTE) The GeneXpert MRSA Assay (FDA approved for NASAL specimens only), is one component of a comprehensive MRSA colonization surveillance program. It is not intended to diagnose MRSA infection nor to guide or monitor treatment for MRSA infections. Test performance is not FDA approved in patients less than 37 years old. Performed at [facility], [address] [place], [place] [address]          Radiology Studies: IR Removal Tun Cv Cath W/O FL  Result Date: 01/19/2021 CLINICAL DATA:  Status post tunneled central venous catheter placement via right internal jugular vein on 12/30/2020. Due to leukocytosis, request has been made to remove the catheter. EXAM: REMOVAL OF TUNNELED CENTRAL VENOUS CATHETER PROCEDURE: The right chest tunneled central venous catheter site was prepped with chlorhexidine. A sterile gown and gloves were worn during the procedure. A retention suture was cut. The subcutaneous cuff of the dialysis catheter was freed by manual traction. The catheter was then successfully removed in its entirety. A sterile dressing was applied over the catheter exit site. IMPRESSION: Removal of tunneled central venous catheter. The procedure was uncomplicated. Electronically Signed   By: Irish Lack M.D.   On: 01/19/2021 14:28        Scheduled Meds:  amoxicillin-clavulanate  1 tablet Oral Q12H   budesonide  9 mg Oral Daily   enoxaparin (LOVENOX) injection  0.5 mg/kg Subcutaneous Q24H   feeding supplement  237 mL Oral TID BM   FLUoxetine  80 mg Oral Daily   fluticasone  1 spray Each Nare Daily   folic acid  1 mg Oral  Daily   furosemide  40 mg Intravenous BID   insulin aspart  0-20 Units Subcutaneous TID WC   insulin aspart  0-5 Units Subcutaneous QHS   insulin aspart  12 Units Subcutaneous TID WC   insulin glargine-yfgn  28 Units Subcutaneous BID   magnesium oxide  400 mg Oral BID   multivitamin with minerals  1 tablet Oral Daily   OLANZapine  5 mg Oral QHS   pantoprazole  40 mg Oral BID   pregabalin  75 mg Oral TID   rifaximin  550 mg Oral BID   spironolactone  100 mg Oral Daily   thiamine  100 mg  Oral Daily   Vitamin D (Ergocalciferol)  50,000 Units Oral Q7 days   Continuous Infusions:  sodium chloride 10 mL/hr at 01/19/21 0416     LOS: 34 days    Time spent: 25 minutes    Sidney Ace, MD [facility]   If 7PM-7AM, please contact night-coverage  01/21/2021, 10:15 AM

## 2021-01-21 NOTE — Progress Notes (Signed)
   01/21/21 0430  Assess: MEWS Score  Temp 99.7 F (37.6 C)  BP 120/68  Pulse Rate (!) 124  Resp 20  SpO2 98 %  O2 Device Nasal Cannula  O2 Flow Rate (L/min) 4 L/min  Assess: MEWS Score  MEWS Temp 0  MEWS Systolic 0  MEWS Pulse 2  MEWS RR 0  MEWS LOC 0  MEWS Score 2  MEWS Score Color Yellow  Assess: SIRS CRITERIA  SIRS Temperature  0  SIRS Pulse 1  SIRS Respirations  0  SIRS WBC 0  SIRS Score Sum  1  [name] M

## 2021-01-22 DIAGNOSIS — J690 Pneumonitis due to inhalation of food and vomit: Secondary | ICD-10-CM | POA: Diagnosis not present

## 2021-01-22 DIAGNOSIS — D72829 Elevated white blood cell count, unspecified: Secondary | ICD-10-CM | POA: Diagnosis not present

## 2021-01-22 LAB — CBC WITH DIFFERENTIAL/PLATELET
Abs Immature Granulocytes: 0.69 10*3/uL — ABNORMAL HIGH (ref 0.00–0.07)
Basophils Absolute: 0.2 10*3/uL — ABNORMAL HIGH (ref 0.0–0.1)
Basophils Relative: 1 %
Eosinophils Absolute: 0.3 10*3/uL (ref 0.0–0.5)
Eosinophils Relative: 1 %
HCT: 27.5 % — ABNORMAL LOW (ref 36.0–46.0)
Hemoglobin: 8.4 g/dL — ABNORMAL LOW (ref 12.0–15.0)
Immature Granulocytes: 2 %
Lymphocytes Relative: 11 %
Lymphs Abs: 3.8 10*3/uL (ref 0.7–4.0)
MCH: 33.6 pg (ref 26.0–34.0)
MCHC: 30.5 g/dL (ref 30.0–36.0)
MCV: 110 fL — ABNORMAL HIGH (ref 80.0–100.0)
Monocytes Absolute: 1.1 10*3/uL — ABNORMAL HIGH (ref 0.1–1.0)
Monocytes Relative: 3 %
Neutro Abs: 28.3 10*3/uL — ABNORMAL HIGH (ref 1.7–7.7)
Neutrophils Relative %: 82 %
Platelets: 258 10*3/uL (ref 150–400)
RBC: 2.5 MIL/uL — ABNORMAL LOW (ref 3.87–5.11)
RDW: 15.9 % — ABNORMAL HIGH (ref 11.5–15.5)
Smear Review: NORMAL
WBC: 34.2 10*3/uL — ABNORMAL HIGH (ref 4.0–10.5)
nRBC: 0 % (ref 0.0–0.2)

## 2021-01-22 LAB — GLUCOSE, CAPILLARY
Glucose-Capillary: 113 mg/dL — ABNORMAL HIGH (ref 70–99)
Glucose-Capillary: 213 mg/dL — ABNORMAL HIGH (ref 70–99)
Glucose-Capillary: 170 mg/dL — ABNORMAL HIGH (ref 70–99)
Glucose-Capillary: 272 mg/dL — ABNORMAL HIGH (ref 70–99)

## 2021-01-22 MED ORDER — FUROSEMIDE 10 MG/ML IJ SOLN
40.0000 mg | Freq: Two times a day (BID) | INTRAMUSCULAR | Status: AC
  Administered 2021-01-22 (×2): 40 mg via INTRAVENOUS
  Filled 2021-01-22 (×2): qty 4

## 2021-01-22 MED ORDER — LOPERAMIDE HCL 2 MG PO CAPS
2.0000 mg | ORAL_CAPSULE | ORAL | Status: DC | PRN
  Administered 2021-01-22: 2 mg via ORAL
  Filled 2021-01-22: qty 1

## 2021-01-22 NOTE — Plan of Care (Signed)
Continuing with plan of care. 

## 2021-01-22 NOTE — Progress Notes (Signed)
PROGRESS NOTE    [name]  KNL:[id number] DOB: [birth year] DOA: 12/17/2020 PCP: Eunice Blase, PA-C    Brief Narrative:  45 y.o. female with medical history significant for bipolar disorder, type 2 diabetes mellitus, alcohol use disorder, polysubstance use disorder, FSHD muscular dystrophy, history of falls, microscopic colitis on budesonide, who was brought to the hospital because of a fall and inability to get up on her own.  She was found to have sepsis secondary to aspiration pneumonia, severe hyponatremia with sodium level of 113, hypokalemia with potassium of 2.9, hyperbilirubinemia with bilirubin of 11.9 and elevated ammonia of 52.   She was treated with empiric IV antibiotics and IV fluids.  Magnesium, phosphorus and potassium were repleted.  She was placed on 2 L/min oxygen for acute hypoxemic respiratory failure.  She remains on 2 L/min oxygen for chronic hypoxemic respiratory failure.  She was given lactulose and rifaximin for hepatic encephalopathy.  She underwent paracentesis for ascites.  There was no evidence of SBP.  After completing antibiotic therapy (Unasyn and Zyvox), she continued to have persistent leukocytosis and she was found to have UTI.  ID was consulted to assist with management.  11/2: Uptrending leukocytosis.  Downtrending procalcitonin.  T-max 100.2 over the last 24 hours. 11/3: Patient feels well.  Procalcitonin downtrending.  Leukocytosis slight downtrend but persistent.  No fevers or minimal 11/5: Persistent leukocytosis.  Slight uptrend.  Patient had tachycardia and increased oxygen demand over interval. 11/6: Persistent leukocytosis.  Abdominal ultrasound only trace ascites.  Not sufficient for paracentesis   Assessment & Plan:   Principal Problem:   Alcohol use disorder, moderate, dependence ([name]) Active Problems:   Alcohol withdrawal ([name])   Bipolar 2 disorder ([name])   Hypokalemia   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[name])   Acute  hyponatremia   Hepatic steatosis   Polysubstance abuse ([name])   Hyperglycemia due to type 2 diabetes mellitus ([name])   Hyperbilirubinemia   Lactic acidosis   Alcoholic liver disease ([name])   Sepsis ([name])   Acute hepatic encephalopathy   Pressure injury of skin   Aspiration pneumonia of both lower lobes due to gastric secretions ([name])  Sepsis secondary to aspiration pneumonia Persistent severe leukocytosis Patient has completed course of IV cefepime on 10/26 All blood cells remain elevated Paracentesis 11/1, negative for SBP Possible aspiration on chest imaging Infectious disease consulted Central line discontinued 11/3 11/5: White blood cell count remains markedly elevated Plan: Continue Augmentin Daily CBC with differential Appreciate infectious disease follow-up   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 Acute hepatic encephalopathy, resolved Hypoalbuminemia Pulmonary hypertension Status post paracentesis x4 this admission Most recent 11/1, negative for SBP No signs of hepatic encephalopathy clinical abdominal distention noted on exam however ultrasound negative for sufficient ascites for paracentesis Plan: Continue IV Lasix Continue Aldactone Continue rifaximin Monitor for encephalopathy Defer abdominal ultrasound  Acute on chronic hypoxic respiratory failure Baseline dependent on 2 L, unable to wean Had increasing oxygen demand to 4 L on 11/5 Plan: Lasix 40 mg IV Encourage incentive spirometry use  Bipolar disorder Depression Appears stable Continue current psychiatric regimen  Type 2 diabetes mellitus with hyperglycemia Semglee 28 units twice daily NovoLog 12 units 3 times daily with meals Carb modified diet Diabetes coordinator consult   DVT prophylaxis: SQ Lovenox Code Status: Full Family Communication: None Disposition Plan: Status is: Inpatient  Remains inpatient appropriate because: Marked leukocytosis of unclear etiology.  Disposition plan  unclear    Level of care: Med-Surg  Consultants:  ID  Procedures:  None  Antimicrobials: Augmentin   Subjective: Patient seen and examined.  Resting in bed.  No visible distress.  Does endorse abdominal distention.  No other complaints  Objective: Vitals:   01/21/21 2046 01/22/21 0347 01/22/21 0353 01/22/21 0755  BP: 118/75  115/72 112/76  Pulse: (!) 103  97 (!) 101  Resp: 16  16 20   Temp: 97.6 F (36.4 C)  98.7 F (37.1 C) 97.8 F (36.6 C)  TempSrc: Oral  Oral Oral  SpO2: 95%  93% 94%  Weight:  97.8 kg    Height:        Intake/Output Summary (Last 24 hours) at 01/22/2021 1129 Last data filed at 01/22/2021 0351 Gross per 24 hour  Intake --  Output 1300 ml  Net -1300 ml   Filed Weights   01/20/21 0548 01/21/21 0430 01/22/21 0347  Weight: 98.3 kg 99.1 kg 97.8 kg    Examination:  General exam: No acute distress Respiratory system: Bibasilar crackles.  Normal work of breathing.  2 L Cardiovascular system: S1-S2, RRR, no murmurs, no pedal edema Gastrointestinal system: Soft, distended with positive fluid wave, decreased bowel sounds Central nervous system: Alert and oriented. No focal neurological deficits. Extremities: Symmetric 5 x 5 power. Skin: No rashes, lesions or ulcers Psychiatry: Judgement and insight appear normal. Mood & affect appropriate.     Data Reviewed: I have personally reviewed following labs and imaging studies  CBC: Recent Labs  Lab 01/18/21 0510 01/19/21 0840 01/20/21 0841 01/21/21 0526 01/22/21 0402  WBC 36.9* 35.7* 32.3* 34.5* 34.2*  NEUTROABS 31.7* 30.3* 26.8* 28.8* 28.3*  HGB 9.1* 8.5* 9.9* 8.6* 8.4*  HCT 28.6* 27.5* 32.9* 28.0* 27.5*  MCV 111.3* 110.4* 113.8* 110.7* 110.0*  PLT 260 245 259 277 0000000   Basic Metabolic Panel: Recent Labs  Lab 01/16/21 0438 01/17/21 0606 01/18/21 0510 01/19/21 0840 01/20/21 0841  NA  --  135 136 134* 137  K  --  4.1 3.9 3.6 3.3*  CL  --  93* 93* 90* 94*  CO2  --  34* 35* 34* 32   GLUCOSE  --  125* 231* 107* 131*  BUN  --  22* 22* 21* 24*  CREATININE  --  0.65 0.58 0.69 0.70  CALCIUM  --  8.3* 8.3* 7.8* 8.5*  MG 1.5* 1.6* 1.7  --   --    GFR: Estimated Creatinine Clearance: 107.8 mL/min (by C-G formula based on SCr of 0.7 mg/dL). Liver Function Tests: No results for input(s): AST, ALT, ALKPHOS, BILITOT, PROT, ALBUMIN in the last 168 hours.  No results for input(s): LIPASE, AMYLASE in the last 168 hours. No results for input(s): AMMONIA in the last 168 hours. Coagulation Profile: No results for input(s): INR, PROTIME in the last 168 hours. Cardiac Enzymes: No results for input(s): CKTOTAL, CKMB, CKMBINDEX, TROPONINI in the last 168 hours. BNP (last 3 results) No results for input(s): PROBNP in the last 8760 hours. HbA1C: No results for input(s): HGBA1C in the last 72 hours. CBG: Recent Labs  Lab 01/21/21 0948 01/21/21 1141 01/21/21 1636 01/21/21 2216 01/22/21 0758  GLUCAP 159* 137* 103* 259* 113*   Lipid Profile: No results for input(s): CHOL, HDL, LDLCALC, TRIG, CHOLHDL, LDLDIRECT in the last 72 hours. Thyroid Function Tests: No results for input(s): TSH, T4TOTAL, FREET4, T3FREE, THYROIDAB in the last 72 hours. Anemia Panel: No results for input(s): VITAMINB12, FOLATE, FERRITIN, TIBC, IRON, RETICCTPCT in the last 72 hours. Sepsis Labs: Recent Labs  Lab 01/17/21 0606 01/18/21 0510  01/19/21 0541  PROCALCITON 2.24 2.06 1.95    Recent Results (from the past 240 hour(s))  Body fluid culture w Gram Stain     Status: None   Collection Time: 01/17/21 11:15 AM   Specimen: PATH Cytology Peritoneal fluid  Result Value Ref Range Status   Specimen Description   Final    PERITONEAL Performed at [facility], [address], [place], [place] [address]    Special Requests   Final    NONE Performed at [facility], [place], [place] [address]    Gram Stain NO WBC SEEN NO ORGANISMS SEEN   Final   Culture   Final     NO GROWTH 3 DAYS Performed at [facility], [place] [address] [place], [place] [address]    Report Status 01/21/2021 FINAL  Final  MRSA Next Gen by PCR, Nasal     Status: None   Collection Time: 01/17/21  4:07 PM   Specimen: Nasal Mucosa; Nasal Swab  Result Value Ref Range Status   MRSA by PCR Next Gen NOT DETECTED NOT DETECTED Final    Comment: (NOTE) The GeneXpert MRSA Assay (FDA approved for NASAL specimens only), is one component of a comprehensive MRSA colonization surveillance program. It is not intended to diagnose MRSA infection nor to guide or monitor treatment for MRSA infections. Test performance is not FDA approved in patients less than 72 years old. Performed at [facility], [address] [place], [place] [address]          Radiology Studies: US Abdomen Limited  Result Date: 01/21/2021 CLINICAL DATA:  Inpatient.  Evaluate for ascites. EXAM: LIMITED ABDOMEN ULTRASOUND FOR ASCITES TECHNIQUE: Limited ultrasound survey for ascites was performed in all four abdominal quadrants. COMPARISON:  12/23/2020 MRI abdomen. FINDINGS: Trace right greater than left lower quadrant ascites. No ascites in the right upper or left upper quadrants. IMPRESSION: Trace ascites, not sufficient for paracentesis. Electronically Signed   By: Ilona Sorrel M.D.   On: 01/21/2021 12:04   DG Chest Port 1 View  Result Date: 01/21/2021 CLINICAL DATA:  Hypoxia EXAM: PORTABLE CHEST 1 VIEW COMPARISON:  01/17/2021 chest radiograph. FINDINGS: Stable cardiomediastinal silhouette with top-normal heart size. No pneumothorax. Stable mild blunting of the right costophrenic angle. No left pleural effusion. Mild-to-moderate bibasilar atelectasis, similar. Stable elevation of the right hemidiaphragm. IMPRESSION: 1. Stable mild-to-moderate bibasilar atelectasis. 2. Stable mild blunting of the right costophrenic angle, cannot exclude small right pleural effusion. Electronically Signed   By: Ilona Sorrel  M.D.   On: 01/21/2021 12:06        Scheduled Meds:  amoxicillin-clavulanate  1 tablet Oral Q12H   budesonide  9 mg Oral Daily   enoxaparin (LOVENOX) injection  0.5 mg/kg Subcutaneous Q24H   feeding supplement  237 mL Oral TID BM   FLUoxetine  80 mg Oral Daily   fluticasone  1 spray Each Nare Daily   folic acid  1 mg Oral Daily   furosemide  40 mg Intravenous BID   insulin aspart  0-20 Units Subcutaneous TID WC   insulin aspart  0-5 Units Subcutaneous QHS   insulin aspart  12 Units Subcutaneous TID WC   insulin glargine-yfgn  28 Units Subcutaneous BID   magnesium oxide  400 mg Oral BID   multivitamin with minerals  1 tablet Oral Daily   OLANZapine  5 mg Oral QHS   pantoprazole  40 mg Oral BID   pregabalin  75 mg  Oral TID   rifaximin  550 mg Oral BID   spironolactone  100 mg Oral Daily   thiamine  100 mg Oral Daily   Vitamin D (Ergocalciferol)  50,000 Units Oral Q7 days   Continuous Infusions:  sodium chloride 10 mL/hr at 01/19/21 0416     LOS: 35 days    Time spent: 25 minutes    Sidney Ace, MD [facility]   If 7PM-7AM, please contact night-coverage  01/22/2021, 11:29 AM

## 2021-01-23 DIAGNOSIS — F1020 Alcohol dependence, uncomplicated: Secondary | ICD-10-CM | POA: Diagnosis not present

## 2021-01-23 LAB — CBC WITH DIFFERENTIAL/PLATELET
Abs Immature Granulocytes: 0.76 10*3/uL — ABNORMAL HIGH (ref 0.00–0.07)
Basophils Absolute: 0.1 10*3/uL (ref 0.0–0.1)
Basophils Relative: 0 %
Eosinophils Absolute: 0.3 10*3/uL (ref 0.0–0.5)
Eosinophils Relative: 1 %
HCT: 28.3 % — ABNORMAL LOW (ref 36.0–46.0)
Hemoglobin: 8.7 g/dL — ABNORMAL LOW (ref 12.0–15.0)
Immature Granulocytes: 2 %
Lymphocytes Relative: 13 %
Lymphs Abs: 4 10*3/uL (ref 0.7–4.0)
MCH: 34 pg (ref 26.0–34.0)
MCHC: 30.7 g/dL (ref 30.0–36.0)
MCV: 110.5 fL — ABNORMAL HIGH (ref 80.0–100.0)
Monocytes Absolute: 1 10*3/uL (ref 0.1–1.0)
Monocytes Relative: 3 %
Neutro Abs: 25.3 10*3/uL — ABNORMAL HIGH (ref 1.7–7.7)
Neutrophils Relative %: 81 %
Platelets: 279 10*3/uL (ref 150–400)
RBC: 2.56 MIL/uL — ABNORMAL LOW (ref 3.87–5.11)
RDW: 16 % — ABNORMAL HIGH (ref 11.5–15.5)
Smear Review: NORMAL
WBC: 31.5 10*3/uL — ABNORMAL HIGH (ref 4.0–10.5)
nRBC: 0 % (ref 0.0–0.2)

## 2021-01-23 LAB — GLUCOSE, CAPILLARY
Glucose-Capillary: 121 mg/dL — ABNORMAL HIGH (ref 70–99)
Glucose-Capillary: 180 mg/dL — ABNORMAL HIGH (ref 70–99)
Glucose-Capillary: 173 mg/dL — ABNORMAL HIGH (ref 70–99)
Glucose-Capillary: 251 mg/dL — ABNORMAL HIGH (ref 70–99)
Glucose-Capillary: 156 mg/dL — ABNORMAL HIGH (ref 70–99)

## 2021-01-23 LAB — COMPREHENSIVE METABOLIC PANEL
ALT: 25 U/L (ref 0–44)
AST: 87 U/L — ABNORMAL HIGH (ref 15–41)
Albumin: 1.6 g/dL — ABNORMAL LOW (ref 3.5–5.0)
Alkaline Phosphatase: 371 U/L — ABNORMAL HIGH (ref 38–126)
Anion gap: 12 (ref 5–15)
BUN: 22 mg/dL — ABNORMAL HIGH (ref 6–20)
CO2: 27 mmol/L (ref 22–32)
Calcium: 8.4 mg/dL — ABNORMAL LOW (ref 8.9–10.3)
Chloride: 96 mmol/L — ABNORMAL LOW (ref 98–111)
Creatinine, Ser: 0.72 mg/dL (ref 0.44–1.00)
GFR, Estimated: >60 mL/min (ref >60)
Glucose, Bld: 167 mg/dL — ABNORMAL HIGH (ref 70–99)
Potassium: 4.1 mmol/L (ref 3.5–5.1)
Sodium: 135 mmol/L (ref 135–145)
Total Bilirubin: 4.3 mg/dL — ABNORMAL HIGH (ref 0.3–1.2)
Total Protein: 7.1 g/dL (ref 6.5–8.1)

## 2021-01-23 MED ORDER — FUROSEMIDE 10 MG/ML IJ SOLN
20.0000 mg | Freq: Two times a day (BID) | INTRAMUSCULAR | Status: AC
  Administered 2021-01-23 – 2021-01-24 (×2): 20 mg via INTRAVENOUS
  Filled 2021-01-23 (×2): qty 4

## 2021-01-23 NOTE — Progress Notes (Signed)
PROGRESS NOTE    [name]  [id number] DOB: May 31, 1975 DOA: 12/17/2020 PCP: Janine Limbo, PA-C    Brief Narrative:  45 y.o. female with medical history significant for bipolar disorder, type 2 diabetes mellitus, alcohol use disorder, polysubstance use disorder, FSHD muscular dystrophy, history of falls, microscopic colitis on budesonide, who was brought to the hospital because of a fall and inability to get up on her own.  She was found to have sepsis secondary to aspiration pneumonia, severe hyponatremia with sodium level of 113, hypokalemia with potassium of 2.9, hyperbilirubinemia with bilirubin of 11.9 and elevated ammonia of 52.   She was treated with empiric IV antibiotics and IV fluids.  Magnesium, phosphorus and potassium were repleted.  She was placed on 2 L/min oxygen for acute hypoxemic respiratory failure.  She remains on 2 L/min oxygen for chronic hypoxemic respiratory failure.  She was given lactulose and rifaximin for hepatic encephalopathy.  She underwent paracentesis for ascites.  There was no evidence of SBP.  After completing antibiotic therapy (Unasyn and Zyvox), she continued to have persistent leukocytosis and she was found to have UTI.  ID was consulted to assist with management.  11/2: Uptrending leukocytosis.  Downtrending procalcitonin.  T-max 100.2 over the last 24 hours. 11/3: Patient feels well.  Procalcitonin downtrending.  Leukocytosis slight downtrend but persistent.  No fevers or minimal 11/5: Persistent leukocytosis.  Slight uptrend.  Patient had tachycardia and increased oxygen demand over interval. 11/6: Persistent leukocytosis.  Abdominal ultrasound only trace ascites.  Not sufficient for paracentesis 11/7: Slight downtrend in leukocytosis.  Still elevated.  31.5   Assessment & Plan:   Principal Problem:   Alcohol use disorder, moderate, dependence ([name]) Active Problems:   Alcohol withdrawal ([name])   Bipolar 2 disorder ([name])   Hypokalemia   FSHD  (facioscapulohumeral muscular dystrophy) ([place])   Acute hyponatremia   Hepatic steatosis   Polysubstance abuse ([name])   Hyperglycemia due to type 2 diabetes mellitus ([name])   Hyperbilirubinemia   Lactic acidosis   Alcoholic liver disease ([name])   Sepsis ([name])   Acute hepatic encephalopathy   Pressure injury of skin   Aspiration pneumonia of both lower lobes due to gastric secretions ([name])  Sepsis secondary to aspiration pneumonia Persistent severe leukocytosis Patient has completed course of IV cefepime on 10/26 All blood cells remain elevated Paracentesis 11/1, negative for SBP Possible aspiration on chest imaging Infectious disease consulted Central line discontinued 11/3 11/5: White blood cell count remains markedly elevated 11.7: White blood cell count 41.5  Plan: Continue empiric Augmentin for now Daily CBC Monitor vitals and fever curve  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 Acute hepatic encephalopathy, resolved Hypoalbuminemia Pulmonary hypertension Status post paracentesis x4 this admission Most recent 11/1, negative for SBP No signs of hepatic encephalopathy clinical abdominal distention noted on exam however ultrasound negative for sufficient ascites for paracentesis Plan: Continue IV Lasix Continue Aldactone Continue rifaximin Monitor for encephalopathy Defer abdominal ultrasound  Acute on chronic hypoxic respiratory failure Baseline dependent on 2 L, unable to wean Had increasing oxygen demand to 4 L on 11/5 Plan: Lasix 40 mg IV Encourage incentive spirometry use  Bipolar disorder Depression Appears stable Continue current psychiatric regimen  Type 2 diabetes mellitus with hyperglycemia Semglee 28 units twice daily NovoLog 12 units 3 times daily with meals Carb modified diet Diabetes coordinator consult   DVT prophylaxis: SQ Lovenox Code Status: Full Family Communication: None Disposition Plan: Status is: Inpatient  Remains inpatient  appropriate because: Marked leukocytosis of  unclear etiology.  Disposition plan unclear    Level of care: Med-Surg  Consultants:  ID  Procedures:  None  Antimicrobials: Augmentin   Subjective: Patient seen and examined.  Resting in bed.  No visible distress.  Does endorse abdominal distention.  No other complaints  Objective: Vitals:   01/22/21 2012 01/23/21 0423 01/23/21 0458 01/23/21 0733  BP: 116/70  102/61 108/68  Pulse: (!) 106  (!) 108 (!) 110  Resp: 16  18 18   Temp: 98.9 F (37.2 C)  98.9 F (37.2 C) 98.4 F (36.9 C)  TempSrc: Oral  Oral Oral  SpO2: 95%  96% 94%  Weight:  95.6 kg    Height:        Intake/Output Summary (Last 24 hours) at 01/23/2021 1109 Last data filed at 01/22/2021 2155 Gross per 24 hour  Intake --  Output 1500 ml  Net -1500 ml   Filed Weights   01/21/21 0430 01/22/21 0347 01/23/21 0423  Weight: 99.1 kg 97.8 kg 95.6 kg    Examination:  General exam: No acute distress Respiratory system: Bibasilar crackles.  Normal work of breathing.  2 L Cardiovascular system: S1-S2, RRR, no murmurs, no pedal edema Gastrointestinal system: Soft, distended with positive fluid wave, decreased bowel sounds Central nervous system: Alert and oriented. No focal neurological deficits. Extremities: Symmetric 5 x 5 power. Skin: No rashes, lesions or ulcers Psychiatry: Judgement and insight appear normal. Mood & affect appropriate.     Data Reviewed: I have personally reviewed following labs and imaging studies  CBC: Recent Labs  Lab 01/19/21 0840 01/20/21 0841 01/21/21 0526 01/22/21 0402 01/23/21 0745  WBC 35.7* 32.3* 34.5* 34.2* 31.5*  NEUTROABS 30.3* 26.8* 28.8* 28.3* 25.3*  HGB 8.5* 9.9* 8.6* 8.4* 8.7*  HCT 27.5* 32.9* 28.0* 27.5* 28.3*  MCV 110.4* 113.8* 110.7* 110.0* 110.5*  PLT 245 259 277 258 [id number]   Basic Metabolic Panel: Recent Labs  Lab 01/17/21 0606 01/18/21 0510 01/19/21 0840 01/20/21 0841 01/23/21 0427  NA 135 136 134* 137  135  K 4.1 3.9 3.6 3.3* 4.1  CL 93* 93* 90* 94* 96*  CO2 34* 35* 34* 32 27  GLUCOSE 125* 231* 107* 131* 167*  BUN 22* 22* 21* 24* 22*  CREATININE 0.65 0.58 0.69 0.70 0.72  CALCIUM 8.3* 8.3* 7.8* 8.5* 8.4*  MG 1.6* 1.7  --   --   --    GFR: Estimated Creatinine Clearance: 106.5 mL/min (by C-G formula based on SCr of 0.72 mg/dL). Liver Function Tests: Recent Labs  Lab 01/23/21 0427  AST 87*  ALT 25  ALKPHOS 371*  BILITOT 4.3*  PROT 7.1  ALBUMIN 1.6*    No results for input(s): LIPASE, AMYLASE in the last 168 hours. No results for input(s): AMMONIA in the last 168 hours. Coagulation Profile: No results for input(s): INR, PROTIME in the last 168 hours. Cardiac Enzymes: No results for input(s): CKTOTAL, CKMB, CKMBINDEX, TROPONINI in the last 168 hours. BNP (last 3 results) No results for input(s): PROBNP in the last 8760 hours. HbA1C: No results for input(s): HGBA1C in the last 72 hours. CBG: Recent Labs  Lab 01/22/21 0758 01/22/21 1130 01/22/21 1619 01/22/21 2135 01/23/21 0734  GLUCAP 113* 213* 170* 272* 180*   Lipid Profile: No results for input(s): CHOL, HDL, LDLCALC, TRIG, CHOLHDL, LDLDIRECT in the last 72 hours. Thyroid Function Tests: No results for input(s): TSH, T4TOTAL, FREET4, T3FREE, THYROIDAB in the last 72 hours. Anemia Panel: No results for input(s): VITAMINB12, FOLATE, FERRITIN, TIBC, IRON,  RETICCTPCT in the last 72 hours. Sepsis Labs: Recent Labs  Lab 01/17/21 0606 01/18/21 0510 01/19/21 0541  PROCALCITON 2.24 2.06 1.95    Recent Results (from the past 240 hour(s))  Body fluid culture w Gram Stain     Status: None   Collection Time: 01/17/21 11:15 AM   Specimen: PATH Cytology Peritoneal fluid  Result Value Ref Range Status   Specimen Description   Final    PERITONEAL Performed at [facility], [address] [place], [place] [address]    Special Requests   Final    NONE Performed at [facility], [address] [place], [place] [address]    Gram Stain NO WBC SEEN NO ORGANISMS SEEN   Final   Culture   Final    NO GROWTH 3 DAYS Performed at [facility], [address] [place], [place] [address]    Report Status 01/21/2021 FINAL  Final  MRSA Next Gen by PCR, Nasal     Status: None   Collection Time: 01/17/21  4:07 PM   Specimen: Nasal Mucosa; Nasal Swab  Result Value Ref Range Status   MRSA by PCR Next Gen NOT DETECTED NOT DETECTED Final    Comment: (NOTE) The GeneXpert MRSA Assay (FDA approved for NASAL specimens only), is one component of a comprehensive MRSA colonization surveillance program. It is not intended to diagnose MRSA infection nor to guide or monitor treatment for MRSA infections. Test performance is not FDA approved in patients less than 82 years old. Performed at [facility], [address], [place], [place] [address]          Radiology Studies: No results found.      Scheduled Meds:  amoxicillin-clavulanate  1 tablet Oral Q12H   budesonide  9 mg Oral Daily   enoxaparin (LOVENOX) injection  0.5 mg/kg Subcutaneous Q24H   feeding supplement  237 mL Oral TID BM   FLUoxetine  80 mg Oral Daily   fluticasone  1 spray Each Nare Daily   folic acid  1 mg Oral Daily   insulin aspart  0-20 Units Subcutaneous TID WC   insulin aspart  0-5 Units Subcutaneous QHS   insulin aspart  12 Units Subcutaneous TID WC   insulin glargine-yfgn  28 Units Subcutaneous BID   magnesium oxide  400 mg Oral BID   multivitamin with minerals  1 tablet Oral Daily   OLANZapine  5 mg Oral QHS   pantoprazole  40 mg Oral BID   pregabalin  75 mg Oral TID   rifaximin  550 mg Oral BID   spironolactone  100 mg Oral Daily   thiamine  100 mg Oral Daily   Vitamin D (Ergocalciferol)  50,000 Units Oral Q7 days   Continuous Infusions:  sodium chloride 10 mL/hr at 01/19/21 0416     LOS: 36 days    Time spent: 15 minutes    Tresa Moore, MD [facility]   If  7PM-7AM, please contact night-coverage  01/23/2021, 11:09 AM

## 2021-01-23 NOTE — Progress Notes (Signed)
Physical Therapy Treatment Patient Details Name: [name] MRN: [id number] DOB: 26-Oct-1975 Today's Date: 01/23/2021   History of Present Illness 45 y.o. female with medical history significant for Bipolar disorder, diabetes, alcohol use disorder and polysubstance abuse, FSH muscular dystrophy, with history of frequent falls, last seen by neurology in January 2020, who was brought in by EMS after she fell at home 2 days prior and was unable to get up.    PT Comments    Pt was pleasant and motivated to participate during the session and put forth good effort throughout. SpO2 was 91% and HR 106 at the beginning of the session. Pt was able to complete all ther ex supine in bed and EOB. Once seated EOB, SpO2 dropped to 88% and HR 108. Pt was motivated to try standing but refused to sit in chair. Pt was able to stand with max +2 assist for a minute and 30 seconds. Pt wanted to try standing a second time, but PT had her try STS at the sink requiring +2 mod assist and was able to stand for 30 seconds. After STS activities, SpO2 increased to 93% and HR 115. Pt will benefit from PT services in a SNF setting upon discharge to safely address deficits listed in patient problem list for decreased caregiver assistance and eventual return to PLOF.   Recommendations for follow up therapy are one component of a multi-disciplinary discharge planning process, led by the attending physician.  Recommendations may be updated based on patient status, additional functional criteria and insurance authorization.  Follow Up Recommendations  Skilled nursing-short term rehab (<3 hours/day)     Assistance Recommended at Discharge Frequent or constant Supervision/Assistance  Equipment Recommendations  None recommended by PT    Recommendations for Other Services       Precautions / Restrictions Precautions Precautions: Fall     Mobility  Bed Mobility Overal bed mobility: Modified Independent Bed Mobility: Supine  to Sit;Sit to Supine Rolling: Modified independent (Device/Increase time) (with use of bed rails) Sidelying to sit: Modified independent (Device/Increase time) (use of handrails)   Sit to supine: Supervision   General bed mobility comments: increased effort/time    Transfers Overall transfer level: Needs assistance Equipment used: Rolling walker (2 wheels) Transfers: Sit to/from Stand Sit to Stand: Min assist (for safety and stability)           General transfer comment: Required max assist when standing from EOB and slightly elevated surface, required min assist for stability and safety when using sink to stand and slightly eleavted surface, stood for 1:30    Ambulation/Gait Ambulation/Gait assistance: Min assist;+2 physical assistance (for stability)   Assistive device: Rolling walker (2 wheels) Gait Pattern/deviations: Step-to pattern;Decreased step length - right;Decreased step length - left;Decreased stride length Gait velocity: decreased     General Gait Details: Took 2-3 lateral steps at EOB toward [facility] with +2 min assist for safety   Stairs             Wheelchair Mobility    Modified Rankin (Stroke Patients Only)       Balance Overall balance assessment: Needs assistance Sitting-balance support: Feet supported;No upper extremity supported Sitting balance-Leahy Scale: Good   Postural control: Left lateral lean Standing balance support: Bilateral upper extremity supported;During functional activity;Reliant on assistive device for balance Standing balance-Leahy Scale: Poor Standing balance comment: Requiring mod assist +2 for maintaining standing position and heavy B UE support  Cognition Arousal/Alertness: Awake/alert Behavior During Therapy: WFL for tasks assessed/performed Overall Cognitive Status: Within Functional Limits for tasks assessed                                           Exercises Total Joint Exercises Ankle Circles/Pumps: AROM;Strengthening;Both;10 reps;Supine Quad Sets: AROM;Strengthening;Both;10 reps;Supine Gluteal Sets: AROM;Strengthening;Both;10 reps;Supine Towel Squeeze: AROM;Strengthening;Supine;Both;10 reps Long Arc Quad: AROM;Both;Seated;10 reps    General Comments        Pertinent Vitals/Pain Pain Location: Bilat hips Pain Descriptors / Indicators: Discomfort    Home Living                          Prior Function            PT Goals (current goals can now be found in the care plan section) Acute Rehab PT Goals Patient Stated Goal: to eventually walk again PT Goal Formulation: With patient Time For Goal Achievement: 02/01/21 Potential to Achieve Goals: Fair Progress towards PT goals: Progressing toward goals    Frequency    Min 2X/week      PT Plan Current plan remains appropriate    Co-evaluation              AM-PAC PT "6 Clicks" Mobility   Outcome Measure  Help needed turning from your back to your side while in a flat bed without using bedrails?: A Little Help needed moving from lying on your back to sitting on the side of a flat bed without using bedrails?: A Little Help needed moving to and from a bed to a chair (including a wheelchair)?: A Lot Help needed standing up from a chair using your arms (e.g., wheelchair or bedside chair)?: A Lot Help needed to walk in hospital room?: Total Help needed climbing 3-5 steps with a railing? : Total 6 Click Score: 12    End of Session Equipment Utilized During Treatment: Gait belt Activity Tolerance: Patient limited by fatigue;Patient tolerated treatment well Patient left: in bed;with call bell/phone within reach;with bed alarm set   PT Visit Diagnosis: Muscle weakness (generalized) (M62.81);Difficulty in walking, not elsewhere classified (R26.2);Unsteadiness on feet (R26.81)     Time: 6195-0932 PT Time Calculation (min) (ACUTE ONLY): 25  min  Charges:  $Therapeutic Exercise: 8-22 mins $Therapeutic Activity: 8-22 mins                     Hildred Alamin SPT 01/23/21, 4:53 PM

## 2021-01-23 NOTE — Progress Notes (Signed)
Pt sleeping so will assess her AM Wbc is around 20 On PO augmentin for aspiration pneumonitis which will be discontinued today

## 2021-01-24 DIAGNOSIS — D72829 Elevated white blood cell count, unspecified: Secondary | ICD-10-CM

## 2021-01-24 DIAGNOSIS — K7011 Alcoholic hepatitis with ascites: Secondary | ICD-10-CM | POA: Diagnosis not present

## 2021-01-24 DIAGNOSIS — F1020 Alcohol dependence, uncomplicated: Secondary | ICD-10-CM | POA: Diagnosis not present

## 2021-01-24 DIAGNOSIS — J690 Pneumonitis due to inhalation of food and vomit: Secondary | ICD-10-CM | POA: Diagnosis not present

## 2021-01-24 LAB — CBC WITH DIFFERENTIAL/PLATELET
Abs Immature Granulocytes: 0.68 10*3/uL — ABNORMAL HIGH (ref 0.00–0.07)
Basophils Absolute: 0.2 10*3/uL — ABNORMAL HIGH (ref 0.0–0.1)
Basophils Relative: 1 %
Eosinophils Absolute: 0.3 10*3/uL (ref 0.0–0.5)
Eosinophils Relative: 1 %
HCT: 28.8 % — ABNORMAL LOW (ref 36.0–46.0)
Hemoglobin: 8.7 g/dL — ABNORMAL LOW (ref 12.0–15.0)
Immature Granulocytes: 2 %
Lymphocytes Relative: 12 %
Lymphs Abs: 3.9 10*3/uL (ref 0.7–4.0)
MCH: 33 pg (ref 26.0–34.0)
MCHC: 30.2 g/dL (ref 30.0–36.0)
MCV: 109.1 fL — ABNORMAL HIGH (ref 80.0–100.0)
Monocytes Absolute: 1 10*3/uL (ref 0.1–1.0)
Monocytes Relative: 3 %
Neutro Abs: 25.8 10*3/uL — ABNORMAL HIGH (ref 1.7–7.7)
Neutrophils Relative %: 81 %
Platelets: 292 10*3/uL (ref 150–400)
RBC: 2.64 MIL/uL — ABNORMAL LOW (ref 3.87–5.11)
RDW: 16 % — ABNORMAL HIGH (ref 11.5–15.5)
Smear Review: NORMAL
WBC: 31.8 10*3/uL — ABNORMAL HIGH (ref 4.0–10.5)
nRBC: 0.1 % (ref 0.0–0.2)

## 2021-01-24 LAB — GLUCOSE, CAPILLARY
Glucose-Capillary: 188 mg/dL — ABNORMAL HIGH (ref 70–99)
Glucose-Capillary: 167 mg/dL — ABNORMAL HIGH (ref 70–99)
Glucose-Capillary: 180 mg/dL — ABNORMAL HIGH (ref 70–99)
Glucose-Capillary: 177 mg/dL — ABNORMAL HIGH (ref 70–99)

## 2021-01-24 LAB — LACTATE DEHYDROGENASE: LDH: 140 U/L (ref 98–192)

## 2021-01-24 NOTE — Progress Notes (Signed)
Date of Admission:  12/17/2020    ID: [name] is a 45 y.o. female    Principal Problem:   Alcohol use disorder, moderate, dependence ([place]) Active Problems:   Alcohol withdrawal ([place])   Bipolar 2 disorder ([place])   Hypokalemia   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[place])   Acute hyponatremia   Hepatic steatosis   Polysubstance abuse ([place])   Hyperglycemia due to type 2 diabetes mellitus ([name])   Hyperbilirubinemia   Lactic acidosis   Alcoholic liver disease ([name])   Sepsis ([place])   Acute hepatic encephalopathy   Pressure injury of skin   Aspiration pneumonia of both lower lobes due to gastric secretions ([name])    Subjective: Pt doing okay No fever No cough Some sob without oxygen No chills  Medications:   budesonide  9 mg Oral Daily   enoxaparin (LOVENOX) injection  0.5 mg/kg Subcutaneous Q24H   feeding supplement  237 mL Oral TID BM   FLUoxetine  80 mg Oral Daily   fluticasone  1 spray Each Nare Daily   folic acid  1 mg Oral Daily   insulin aspart  0-20 Units Subcutaneous TID WC   insulin aspart  0-5 Units Subcutaneous QHS   insulin aspart  12 Units Subcutaneous TID WC   insulin glargine-yfgn  28 Units Subcutaneous BID   magnesium oxide  400 mg Oral BID   multivitamin with minerals  1 tablet Oral Daily   OLANZapine  5 mg Oral QHS   pantoprazole  40 mg Oral BID   pregabalin  75 mg Oral TID   rifaximin  550 mg Oral BID   spironolactone  100 mg Oral Daily   thiamine  100 mg Oral Daily   Vitamin D (Ergocalciferol)  50,000 Units Oral Q7 days    Objective: Vital signs in last 24 hours: Temp:  [98 F (36.7 C)-98.7 F (37.1 C)] 98.4 F (36.9 C) (11/08 1527) Pulse Rate:  [106-118] 109 (11/08 1527) Resp:  [18-20] 18 (11/08 0815) BP: (112-120)/(65-79) 120/78 (11/08 1527) SpO2:  [90 %-96 %] 90 % (11/08 1527) Weight:  [98.8 kg] 98.8 kg (11/08 0415)  PHYSICAL EXAM:  General: Alert, cooperative, no distress, appears stated age. Nasal cannula Cushingoid  features  Head: Normocephalic, without obvious abnormality, atraumatic. Eyes: Conjunctivae clear, anicteric sclerae. Pupils are equal ENT Nares normal. No drainage or sinus tenderness. Lips, mucosa, and tongue normal. No Thrush  Neck: Supple, symmetrical, no adenopathy, thyroid: non tender no carotid bruit and no JVD. Back: No CVA tenderness. Lungs: b/l air entry- few basal crepts Heart: irregular Abdomen- obese Abdominal wall edema Rt >>> left But no erythema or tenderness Neurologic: weakness upper body   Lab Results Recent Labs    01/23/21 0427 01/23/21 0745 01/24/21 0501  WBC  --  31.5* 31.8*  HGB  --  8.7* 8.7*  HCT  --  28.3* 28.8*  NA 135  --   --   K 4.1  --   --   CL 96*  --   --   CO2 27  --   --   BUN 22*  --   --   CREATININE 0.72  --   --    Microbiology: 01/09/2021 urine culture 30,000 colonies of mixed species 01/06/2021 abdominal fluid culture no growth 01/04/2021 blood cultures no growth  10-22 blood culture no growth  01/04/2021 catheter tip no growth   Assessment/Plan:  Alcoholic hepatitis with ascites S/p paracentesis X 34 No SPB Leucocytosis with no  clear evidence of infection now clinically improving and does not look septic CXR done  shows b/l lower lobe infiltrate CT chest done shows b/l infiltrate  This looks more like atelectais rather than pneumonic consolidation Doubt this is the cause of leucocytosis Central line has been removed-  Paracentesis no SBP Zozyn started on 01/17/21 Discontinued 01/19/21- on PO augmentin completed 7 days and will Dc today Will need heme consult   Acute hepatic encephalopathy- resolved   DM- poorly controlled   Fascioscapulo humeral muscular dystrophy   Pt on budesonide oral capsule 9mg  /day- microscopic colitis  Discussed the management with the patient and care team [facility] will cover remotely tomorrow

## 2021-01-24 NOTE — Consult Note (Addendum)
Hematology/Oncology Consult Note [facility]  [phone] [phone]    Patient Care Team: Janine Limbo, Hershal Coria as PCP - General (Internal Medicine)   Name of the patient: Kelci Petrella  [id number]  May 26, 1975   Date of visit: 01/24/2021  History of Presenting Illness- Patient is 45 year old female with past medical history significant for bipolar disorder, diabetes type 2, alcohol use disorder, polysubstance abuse, FSH muscular dystrophy, frequent falls, and colitis who is currently admitted to hospital for sepsis secondary to aspiration pneumonia, severe hyponatremia with sodium level of 113, hypokalemia, hyperbilirubinemia with bilirubin of 11.9 and elevated ammonia of 52. During hospitalization she has been treated with empiric IV antibiotics, fluids, electrolytes, oxygen. She received lactulsoe and rifaximin for hepatic encephalopathy and underwent paracentesis for ascites. She has had persistent leukocytosis post antibiotic therapy and was found to have UTI. Over past few days leukocytosis has persisted post treatment of UTI while procalcitonin has resolved. Symptomatically she has improved during hospitalization. Denies chills, night sweats, unintentional weight loss. No new lumps or bumps. No abnormal bleeding or bruising.   ECOG PS- 3-4 during hospitalization.   Review of Systems  Constitutional:  Positive for malaise/fatigue (improving). Negative for chills, fever and weight loss.  HENT:  Negative for hearing loss, nosebleeds, sore throat and tinnitus.   Eyes:  Negative for blurred vision and double vision.  Respiratory:  Positive for shortness of breath (improving). Negative for cough, hemoptysis and wheezing.        On oxygen via nasal cannula  Cardiovascular:  Negative for chest pain, palpitations and leg swelling.  Gastrointestinal:  Negative for abdominal pain, blood in stool, constipation, diarrhea, melena, nausea and vomiting.   Genitourinary:  Negative for dysuria and urgency.  Musculoskeletal:  Negative for back pain, falls, joint pain and myalgias.  Skin:  Negative for itching and rash.  Neurological:  Negative for dizziness, tingling, sensory change, loss of consciousness, weakness and headaches.  Endo/Heme/Allergies:  Negative for environmental allergies. Does not bruise/bleed easily.  Psychiatric/Behavioral:  Negative for depression. The patient is not nervous/anxious and does not have insomnia.    Allergies  Allergen Reactions   Erythromycin Anaphylaxis   Sulfa Antibiotics Anaphylaxis   Tylenol [Acetaminophen] Other (See Comments)    Pancreatitis and liver dysfunction, not supposed to take med   Nsaids Other (See Comments)    Causes GI problems, very bad reaction-per patient.    Gabapentin     Mood changes     Patient Active Problem List   Diagnosis Date Noted   Aspiration pneumonia of both lower lobes due to gastric secretions ([place]) 01/04/2021   Pressure injury of skin 12/24/2020   Acute hepatic encephalopathy 12/20/2020   Sepsis ([place]) 12/19/2020   Acute hyponatremia 12/18/2020   Alcohol use disorder, moderate, dependence ([place]) 12/18/2020   Hepatic steatosis 12/18/2020   Polysubstance abuse ([place]) 12/18/2020   Hyperglycemia due to type 2 diabetes mellitus ([place]) 12/18/2020   Hyperbilirubinemia 12/18/2020   Lactic acidosis 16/03/930   Alcoholic liver disease ([place]) 12/18/2020   Bipolar 1 disorder, manic, moderate ([place]) 09/10/2018   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   Diabetes ([place]) 09/10/2018   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[place]) 03/28/2018   Anxiety and depression 10/01/2012   Protein-calorie malnutrition, severe ([place]) 09/30/2012   Alcohol abuse 09/26/2012   Trichomonas infection 09/26/2012   Dehydration 09/26/2012   Hypokalemia 11/14/2011   Thrombocytopenia ([place]) 11/14/2011   Alcohol withdrawal ([place]) 11/13/2011   Bipolar 2 disorder ([place]) 11/13/2011   Tobacco abuse  11/13/2011   Cocaine abuse ([place]) 11/13/2011   Gout 11/13/2011    Past Medical History:  Diagnosis Date   Alcohol abuse    Anxiety    Anxiety    Anxiety and depression    Asthma    Bipolar disorder ([place])    Borderline personality disorder ([place])    Depression    Diabetes ([place])    Type 2    Dyslipidemia    Esophageal varices ([name])    Foot drop    right   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[place])    GERD (gastroesophageal reflux disease)    Gout    History of drug abuse ([place])    Insomnia    Muscular dystrophies ([employer])    Neuropathy    Pancreatitis    Recovering alcoholic ([place])     Past Surgical History:  Procedure Laterality Date   HERNIA REPAIR     IR FLUORO GUIDE CV LINE RIGHT  12/30/2020   IR REMOVAL TUN CV CATH W/O FL  01/19/2021   IR US GUIDE VASC ACCESS RIGHT  12/30/2020    Social History   Socioeconomic History   Marital status: Legally Separated    Spouse name: Not on file   Number of children: Not on file   Years of education: Not on file   Highest education level: Not on file  Occupational History   Occupation: disabled  Tobacco Use   Smoking status: Every Day    Packs/day: 0.50    Types: Cigarettes   Smokeless tobacco: Never  Substance and Sexual Activity   Alcohol use: Yes   Drug use: Yes    Types: IV, Cocaine   Sexual activity: Yes    Birth control/protection: None  Other Topics Concern   Not on file  Social History Narrative   Not on file   Social Determinants of Health   Financial Resource Strain: Not on file  Food Insecurity: Not on file  Transportation Needs: Not on file  Physical Activity: Not on file  Stress: Not on file  Social Connections: Not on file  Intimate Partner Violence: Not on file    Family History  Problem Relation Age of Onset   Heart disease Mother    Heart attack Mother    Hyperlipidemia Maternal Grandmother    Diabetes Maternal Grandmother    Hypertension Maternal Grandmother    Hyperlipidemia  Maternal Grandfather    Heart disease Maternal Grandfather    Heart attack Maternal Grandfather    Diabetes Maternal Grandfather    Lung cancer Maternal Grandfather    Hypertension Maternal Grandfather    Other Paternal Grandmother        Thyroid problems    Heart disease Paternal Grandfather    Stroke Paternal Grandfather    Hyperlipidemia Paternal Grandfather    Heart attack Paternal Grandfather    Hypertension Paternal Grandfather     Current Facility-Administered Medications:    0.9 %  sodium chloride infusion, , Intravenous, PRN, Marcell Anger, MD, Last Rate: 10 mL/hr at 01/19/21 0416, Infusion Verify at 01/19/21 0416   albuterol (PROVENTIL) (2.5 MG/3ML) 0.083% nebulizer solution 2.5 mg, 2.5 mg, Nebulization, Q6H PRN, Marcell Anger, MD, 2.5 mg at 01/07/21 0855   budesonide (ENTOCORT EC) 24 hr capsule 9 mg, 9 mg, Oral, Daily, Spongberg, Audie Pinto, MD, 9 mg at 01/24/21 1145   dicyclomine (BENTYL) tablet 20 mg, 20 mg, Oral, TID PRN, Athena Masse, MD, 20 mg at 01/24/21 0145   diphenoxylate-atropine (LOMOTIL) 2.5-0.025  MG per tablet 1 tablet, 1 tablet, Oral, QID PRN, Ralene Muskrat B, MD, 1 tablet at 01/23/21 0422   enoxaparin (LOVENOX) injection 50 mg, 0.5 mg/kg, Subcutaneous, Q24H, Benita Gutter, RPH, 50 mg at 01/24/21 1651   feeding supplement (ENSURE ENLIVE / ENSURE PLUS) liquid 237 mL, 237 mL, Oral, TID BM, Jennye Boroughs, MD, 237 mL at 01/24/21 0834   FLUoxetine (PROZAC) capsule 80 mg, 80 mg, Oral, Daily, Spongberg, Audie Pinto, MD, 80 mg at 01/24/21 1145   fluticasone (FLONASE) 50 MCG/ACT nasal spray 1 spray, 1 spray, Each Nare, Daily, Spongberg, Audie Pinto, MD, 1 spray at 66/44/03 4742   folic acid (FOLVITE) tablet 1 mg, 1 mg, Oral, Daily, Spongberg, Audie Pinto, MD, 1 mg at 01/24/21 1146   insulin aspart (novoLOG) injection 0-20 Units, 0-20 Units, Subcutaneous, TID WC, Spongberg, Audie Pinto, MD, 4 Units at 01/24/21 1650   insulin aspart  (novoLOG) injection 0-5 Units, 0-5 Units, Subcutaneous, QHS, Spongberg, Audie Pinto, MD, 3 Units at 01/22/21 2140   insulin aspart (novoLOG) injection 12 Units, 12 Units, Subcutaneous, TID WC, Sreenath, Sudheer B, MD, 12 Units at 01/24/21 1649   insulin glargine-yfgn (SEMGLEE) injection 28 Units, 28 Units, Subcutaneous, BID, Jennye Boroughs, MD, 28 Units at 01/24/21 0826   lactulose (CHRONULAC) 10 GM/15ML solution 20 g, 20 g, Oral, BID PRN, Marius Ditch, Tally Due, MD   loperamide (IMODIUM) capsule 2 mg, 2 mg, Oral, PRN, Priscella Mann, Sudheer B, MD, 2 mg at 01/22/21 1336   magnesium oxide (MAG-OX) tablet 400 mg, 400 mg, Oral, BID, Jennye Boroughs, MD, 400 mg at 01/24/21 1146   methocarbamol (ROBAXIN) tablet 500 mg, 500 mg, Oral, BID PRN, Marcell Anger, MD, 500 mg at 01/21/21 2230   multivitamin with minerals tablet 1 tablet, 1 tablet, Oral, Daily, Spongberg, Audie Pinto, MD, 1 tablet at 01/23/21 0842   OLANZapine (ZYPREXA) tablet 5 mg, 5 mg, Oral, QHS, Clapacs, John T, MD, 5 mg at 01/23/21 2203   ondansetron (ZOFRAN) tablet 4 mg, 4 mg, Oral, Q6H PRN **OR** ondansetron (ZOFRAN) injection 4 mg, 4 mg, Intravenous, Q6H PRN, Spongberg, Audie Pinto, MD, 4 mg at 01/24/21 0826   pantoprazole (PROTONIX) EC tablet 40 mg, 40 mg, Oral, BID, Spongberg, Audie Pinto, MD, 40 mg at 01/24/21 1145   pregabalin (LYRICA) capsule 75 mg, 75 mg, Oral, TID, Spongberg, Audie Pinto, MD, 75 mg at 01/24/21 1649   rifaximin (XIFAXAN) tablet 550 mg, 550 mg, Oral, BID, Sharen Hones, MD, 550 mg at 01/24/21 1145   sodium chloride flush (NS) 0.9 % injection 10-40 mL, 10-40 mL, Intracatheter, PRN, Leonel Ramsay, MD   spironolactone (ALDACTONE) tablet 100 mg, 100 mg, Oral, Daily, Vanga, Tally Due, MD, 100 mg at 01/24/21 1146   [COMPLETED] thiamine 500mg  in normal saline (65ml) IVPB, 500 mg, Intravenous, Q8H, Last Rate: 100 mL/hr at 12/22/20 1648, 500 mg at 12/22/20 1648 **FOLLOWED BY** thiamine tablet 100 mg, 100 mg,  Oral, Daily, Spongberg, Audie Pinto, MD, 100 mg at 01/24/21 1145   Vitamin D (Ergocalciferol) (DRISDOL) capsule 50,000 Units, 50,000 Units, Oral, Q7 days, Marcell Anger, MD, 50,000 Units at 01/19/21 0940  BP 120/78 (BP Location: Right Arm)   Pulse (!) 109   Temp 98.4 F (36.9 C) (Oral)   Resp 18   Ht 5\' 7"  (1.702 m)   Wt 217 lb 13 oz (98.8 kg)   LMP  (LMP Unknown)   SpO2 90%   BMI 34.11 kg/m    Physical Exam Constitutional:  General: She is not in acute distress.    Appearance: She is obese. She is not ill-appearing, toxic-appearing or diaphoretic.     Interventions: Nasal cannula in place.     Comments: Alert. Sitting up in hospital bed watching tv.   Cardiovascular:     Rate and Rhythm: Normal rate and regular rhythm.  Pulmonary:     Effort: Pulmonary effort is normal. No respiratory distress.  Abdominal:     General: Bowel sounds are normal.     Palpations: There is no mass.     Comments: Hepatomegaly  Lymphadenopathy:     Cervical: No cervical adenopathy.     Upper Body:     Right upper body: No supraclavicular or axillary adenopathy.     Left upper body: No supraclavicular or axillary adenopathy.  Skin:    General: Skin is warm and dry.     Coloration: Skin is not pale.     Findings: No bruising or lesion.  Neurological:     Mental Status: She is alert and oriented to person, place, and time.     Motor: No weakness.  Psychiatric:        Mood and Affect: Mood normal.        Behavior: Behavior normal.     CMP Latest Ref Rng & Units 01/23/2021  Glucose 70 - 99 mg/dL 167(H)  BUN 6 - 20 mg/dL 22(H)  Creatinine 0.44 - 1.00 mg/dL 0.72  Sodium 135 - 145 mmol/L 135  Potassium 3.5 - 5.1 mmol/L 4.1  Chloride 98 - 111 mmol/L 96(L)  CO2 22 - 32 mmol/L 27  Calcium 8.9 - 10.3 mg/dL 8.4(L)  Total Protein 6.5 - 8.1 g/dL 7.1  Total Bilirubin 0.3 - 1.2 mg/dL 4.3(H)  Alkaline Phos 38 - 126 U/L 371(H)  AST 15 - 41 U/L 87(H)  ALT 0 - 44 U/L 25   CBC Latest  Ref Rng & Units 01/24/2021  WBC 4.0 - 10.5 K/uL 31.8(H)  Hemoglobin 12.0 - 15.0 g/dL 8.7(L)  Hematocrit 36.0 - 46.0 % 28.8(L)  Platelets 150 - 400 K/uL 292    DG Chest 2 View  Result Date: 01/05/2021 CLINICAL DATA:  Aspiration pneumonia. EXAM: CHEST - 2 VIEW COMPARISON:  January 02, 2021. FINDINGS: Stable cardiomediastinal silhouette. Stable elevated right hemidiaphragm is noted with associated right basilar atelectasis and effusion. Mild left basilar subsegmental atelectasis is noted. Right-sided PICC line is unchanged in position. Bony thorax is unremarkable. IMPRESSION: Stable elevated right hemidiaphragm is noted with associated right basilar atelectasis and effusion. Mild left basilar subsegmental atelectasis is noted. Electronically Signed   By: Marijo Conception M.D.   On: 01/05/2021 17:08   DG Abd 1 View  Result Date: 01/10/2021 CLINICAL DATA:  Abdomen pain fluid retention EXAM: ABDOMEN - 1 VIEW COMPARISON:  MRI 12/23/2020 FINDINGS: Limited by habitus. Overall nonobstructed gas pattern. No radiopaque calculi. IMPRESSION: Nonobstructed gas pattern Electronically Signed   By: Donavan Foil M.D.   On: 01/10/2021 15:47   CT CHEST WO CONTRAST  Result Date: 01/17/2021 CLINICAL DATA:  Pneumonia, effusion or abscess suspected, xray done persistent leukocytosis EXAM: CT CHEST WITHOUT CONTRAST TECHNIQUE: Multidetector CT imaging of the chest was performed following the standard protocol without IV contrast. COMPARISON:  CT chest 12/29/2020 FINDINGS: Lines and tubes: Right central venous catheter with tip terminating at the superior cavoatrial junction. Cardiovascular: Normal heart size. No significant pericardial effusion. The thoracic aorta is normal in caliber. Likely 2 vessel coronary artery calcifications. Mediastinum/Nodes: No enlarged mediastinal  or axillary lymph nodes. Thyroid gland, trachea, and esophagus demonstrate no significant findings. Lungs/Pleura: Elevated right hemidiaphragm.  Bilateral lower lobe peribronchovascular consolidations with air bronchograms. No pulmonary nodule. No pulmonary mass. Interval resolution of right pleural effusion. No pleural effusion. No pneumothorax. Upper Abdomen: No acute abnormality. Musculoskeletal: Subcutaneus soft tissue edema along the lateral right upper abdomen. No suspicious lytic or blastic osseous lesions. No acute displaced fracture. IMPRESSION: 1. Bilateral lower lobe peribronchovascular consolidations with air bronchograms. Findings could represent a combination of infection/inflammation versus atelectasis. Limited evaluation on this noncontrast study. 2. Interval resolution of a right pleural effusion. Elevated right hemidiaphragm. 3. Subcutaneus soft tissue edema along the lateral right upper abdomen. Electronically Signed   By: Iven Finn M.D.   On: 01/17/2021 15:59   CT Angio Chest Pulmonary Embolism (PE) W or WO Contrast  Result Date: 12/29/2020 CLINICAL DATA:  Inpatient.  Dyspnea.  Abnormal chest radiograph. EXAM: CT ANGIOGRAPHY CHEST WITH CONTRAST TECHNIQUE: Multidetector CT imaging of the chest was performed using the standard protocol during bolus administration of intravenous contrast. Multiplanar CT image reconstructions and MIPs were obtained to evaluate the vascular anatomy. CONTRAST:  25mL OMNIPAQUE IOHEXOL 350 MG/ML SOLN COMPARISON:  Chest radiograph from one day prior. FINDINGS: Cardiovascular: The study is high quality for the evaluation of pulmonary embolism. There are no filling defects in the central, lobar, segmental or subsegmental pulmonary artery branches to suggest acute pulmonary embolism. Normal course and caliber of the thoracic aorta. Top-normal caliber main pulmonary artery (3.1 cm diameter). Top-normal heart size. No significant pericardial fluid/thickening. Mediastinum/Nodes: No discrete thyroid nodules. Unremarkable esophagus. No pathologically enlarged axillary, mediastinal or hilar lymph nodes.  Lungs/Pleura: No pneumothorax. Small dependent right pleural effusion. No left pleural effusion. Moderate elevation of the right hemidiaphragm. Complete right lower lobe and near complete right middle lobe atelectasis. Moderate dependent left lower lobe and lingular atelectasis. No lung masses or significant pulmonary nodules in the aerated portions of the lungs. Upper abdomen: Small volume perihepatic ascites. Diffuse hepatic steatosis. Musculoskeletal: No aggressive appearing focal osseous lesions. Minimal thoracic spondylosis. Review of the MIP images confirms the above findings. IMPRESSION: 1. No pulmonary embolism. 2. Moderate elevation of the right hemidiaphragm. Complete right lower lobe and near complete right middle lobe atelectasis. Moderate dependent left lung base atelectasis. 3. Small dependent right pleural effusion. 4. Small volume perihepatic ascites. 5. Diffuse hepatic steatosis. Electronically Signed   By: Ilona Sorrel M.D.   On: 12/29/2020 18:28   [place] CHEST (PLEURAL EFFUSION)  Result Date: 12/29/2020 CLINICAL DATA:  Evaluate right pleural effusion for thoracentesis. EXAM: CHEST ULTRASOUND COMPARISON:  Chest radiograph 12/28/2020 FINDINGS: Minimal fluid in the right pleural space. IMPRESSION: 1. Minimal fluid in the right pleural space. Thoracentesis not performed due to the small amount of pleural fluid. 2. Opacities in the right lower chest on recent chest radiograph probably represent a combination of elevation the right hemidiaphragm and atelectasis. Electronically Signed   By: Markus Daft M.D.   On: 12/29/2020 12:25   US Abdomen Limited  Result Date: 01/21/2021 CLINICAL DATA:  Inpatient.  Evaluate for ascites. EXAM: LIMITED ABDOMEN ULTRASOUND FOR ASCITES TECHNIQUE: Limited ultrasound survey for ascites was performed in all four abdominal quadrants. COMPARISON:  12/23/2020 MRI abdomen. FINDINGS: Trace right greater than left lower quadrant ascites. No ascites in the right upper or left  upper quadrants. IMPRESSION: Trace ascites, not sufficient for paracentesis. Electronically Signed   By: Ilona Sorrel M.D.   On: 01/21/2021 12:04   US Paracentesis  Result  Date: 01/17/2021 INDICATION: Cirrhosis with recurrent ascites. Request received for diagnostic and therapeutic paracentesis. EXAM: ULTRASOUND GUIDED PARACENTESIS MEDICATIONS: Local 1% lidocaine only. COMPLICATIONS: None immediate. PROCEDURE: Informed written consent was obtained from the patient after a discussion of the risks, benefits and alternatives to treatment. A timeout was performed prior to the initiation of the procedure. Initial ultrasound scanning demonstrates a moderate amount of ascites within the right lower abdominal quadrant. The right lower abdomen was prepped and draped in the usual sterile fashion. 1% lidocaine was used for local anesthesia. Following this, a 19 gauge, 10-cm, Yueh catheter was introduced. An ultrasound image was saved for documentation purposes. The paracentesis was performed. The catheter was removed and a dressing was applied. The patient tolerated the procedure well without immediate post procedural complication. FINDINGS: A total of approximately 1800 mL of yellow-colored fluid was removed. Samples were sent to the laboratory as requested by the clinical team. IMPRESSION: Successful ultrasound-guided paracentesis yielding 1.8 liters of peritoneal fluid. Read By: Tsosie Billing PA-C Electronically Signed   By: Lucrezia Europe M.D.   On: 01/17/2021 12:39   US Paracentesis  Result Date: 01/12/2021 INDICATION: Ascites EXAM: ULTRASOUND GUIDED  PARACENTESIS MEDICATIONS: None. COMPLICATIONS: None immediate. PROCEDURE: Informed written consent was obtained from the patient after a discussion of the risks, benefits and alternatives to treatment. A timeout was performed prior to the initiation of the procedure. Initial ultrasound scanning demonstrates a large amount of ascites within the right lower abdominal  quadrant. The right lower abdomen was prepped and draped in the usual sterile fashion. 1% lidocaine was used for local anesthesia. Following this, a 19 gauge Yueh catheter was introduced. An ultrasound image was saved for documentation purposes. The paracentesis was performed. The catheter was removed and a dressing was applied. The patient tolerated the procedure well without immediate post procedural complication. FINDINGS: A total of approximately 2 L of clear straw-colored fluid was removed. IMPRESSION: Successful ultrasound-guided paracentesis yielding 2 liters of clear straw-colored peritoneal fluid. Electronically Signed   By: Albin Felling M.D.   On: 01/12/2021 13:56   US Paracentesis  Result Date: 01/06/2021 INDICATION: Ascites due to alcoholic hepatitis. EXAM: ULTRASOUND GUIDED  PARACENTESIS MEDICATIONS: None. COMPLICATIONS: None immediate. PROCEDURE: Informed written consent was obtained from the patient after a discussion of the risks, benefits and alternatives to treatment. A timeout was performed prior to the initiation of the procedure. Initial ultrasound scanning demonstrates a moderate amount of ascites within the right lower abdominal quadrant. The right lower abdomen was prepped and draped in the usual sterile fashion. 1% lidocaine was used for local anesthesia. Following this, a 19 gauge, 10-cm, Yueh catheter was introduced. An ultrasound image was saved for documentation purposes. The paracentesis was performed. The catheter was removed and a dressing was applied. The patient tolerated the procedure well without immediate post procedural complication. FINDINGS: A total of approximately 750 mL of yellow fluid was removed. Samples were sent to the laboratory as requested by the clinical team. IMPRESSION: Successful ultrasound-guided paracentesis yielding 750 mL of peritoneal fluid. Electronically Signed   By: Markus Daft M.D.   On: 01/06/2021 13:15   US Paracentesis  Result Date:  12/30/2020 INDICATION: Symptomatic ascites EXAM: ULTRASOUND-GUIDED DIAGNOSTIC PARACENTESIS COMPARISON:  CTA PE, 12/29/2020. MR abdomen, 12/23/2020. Abdominal ultrasound, 12/18/2020., MEDICATIONS: None. COMPLICATIONS: None immediate. TECHNIQUE: Informed written consent was obtained from the patient after a discussion of the risks, benefits and alternatives to treatment. A timeout was performed prior to the initiation of the procedure. Initial ultrasound scanning  demonstrates a moderate amount of ascites within the right upper abdominal quadrant. The right upper abdomen was prepped and draped in the usual sterile fashion. 1% lidocaine with epinephrine was used for local anesthesia. An ultrasound image was saved for documentation purposed. An 8 Fr Safe-T-Centesis catheter was introduced. The paracentesis was performed. The catheter was removed and a dressing was applied. The patient tolerated the procedure well without immediate post procedural complication. FINDINGS: A total of approximately 2.2 liters of bilious ascitic fluid was removed. Samples were sent to the laboratory as requested by the clinical team. IMPRESSION: Successful ultrasound-guided paracentesis yielding 2.2 liters of bilious ascitic fluid. Michaelle Birks, MD [facility] Electronically Signed   By: Michaelle Birks M.D.   On: 12/30/2020 10:08   IR Fluoro Guide CV Line Right  Result Date: 12/30/2020 INDICATION: Poor IV access. EXAM: ULTRASOUND AND FLUOROSCOPIC GUIDED PLACEMENT OF TUNNELED CENTRAL VENOUS CATHETER MEDICATIONS: None. ANESTHESIA/SEDATION: Local anesthetic was administered. FLUOROSCOPY TIME:  36 seconds (6.5 mGy) COMPLICATIONS: None immediate. PROCEDURE: Informed written consent was obtained from the patient after a discussion of the risks, benefits, and alternatives to treatment. Questions regarding the procedure were encouraged and answered. The right neck and chest were prepped  with chlorhexidine in a sterile fashion, and a sterile drape was applied covering the operative field. Maximum barrier sterile technique with sterile gowns and gloves were used for the procedure. A timeout was performed prior to the initiation of the procedure. After the overlying soft tissues were anesthetized, a small venotomy incision was created and a micropuncture kit was utilized to access the internal jugular vein. Real-time ultrasound guidance was utilized for vascular access including the acquisition of a permanent ultrasound image documenting patency of the accessed vessel. The microwire was utilized to measure appropriate catheter length. The micropuncture sheath was exchanged for a peel-away sheath over a guidewire. A 5 [place] dual lumen tunneled central venous catheter measuring 25 cm was tunneled in a retrograde fashion from the anterior chest wall to the venotomy incision. The catheter was then placed through the peel-away sheath with tip ultimately positioned at the RIGHT atrium. Final catheter positioning was confirmed and documented with a spot radiographic image. The catheter aspirates and flushes normally. The catheter was flushed with appropriate volume heparin dwells. The catheter exit site was secured with a 2-0 nylon retention suture. The venotomy incision was closed with Dermabond. Dressings were applied. The patient tolerated the procedure well without immediate post procedural complication. FINDINGS: After catheter placement, the tip lies within the RIGHT atrium the catheter aspirates and flushes normally and is ready for immediate use. IMPRESSION: Successful placement of 25 cm dual lumen tunneled open "Power Line" central venous catheter via the right internal jugular vein with tip terminating in the RIGHT atrium. The catheter is ready for immediate use. Michaelle Birks, MD [facility] Radiology Electronically Signed   By: Michaelle Birks M.D.    On: 12/30/2020 18:05   IR Removal Tun Cv Cath W/O FL  Result Date: 01/19/2021 CLINICAL DATA:  Status post tunneled central venous catheter placement via right internal jugular vein on 12/30/2020. Due to leukocytosis, request has been made to remove the catheter. EXAM: REMOVAL OF TUNNELED CENTRAL VENOUS CATHETER PROCEDURE: The right chest tunneled central venous catheter site was prepped with chlorhexidine. A sterile gown and gloves were worn during the procedure. A retention suture was cut. The subcutaneous cuff of the dialysis catheter was freed by manual traction. The catheter was  then successfully removed in its entirety. A sterile dressing was applied over the catheter exit site. IMPRESSION: Removal of tunneled central venous catheter. The procedure was uncomplicated. Electronically Signed   By: Aletta Edouard M.D.   On: 01/19/2021 14:28   IR US Guide Vasc Access Right  Result Date: 12/30/2020 INDICATION: Poor IV access. EXAM: ULTRASOUND AND FLUOROSCOPIC GUIDED PLACEMENT OF TUNNELED CENTRAL VENOUS CATHETER MEDICATIONS: None. ANESTHESIA/SEDATION: Local anesthetic was administered. FLUOROSCOPY TIME:  36 seconds (6.5 mGy) COMPLICATIONS: None immediate. PROCEDURE: Informed written consent was obtained from the patient after a discussion of the risks, benefits, and alternatives to treatment. Questions regarding the procedure were encouraged and answered. The right neck and chest were prepped with chlorhexidine in a sterile fashion, and a sterile drape was applied covering the operative field. Maximum barrier sterile technique with sterile gowns and gloves were used for the procedure. A timeout was performed prior to the initiation of the procedure. After the overlying soft tissues were anesthetized, a small venotomy incision was created and a micropuncture kit was utilized to access the internal jugular vein. Real-time ultrasound guidance was utilized for vascular access including the acquisition of a  permanent ultrasound image documenting patency of the accessed vessel. The microwire was utilized to measure appropriate catheter length. The micropuncture sheath was exchanged for a peel-away sheath over a guidewire. A 5 [place] dual lumen tunneled central venous catheter measuring 25 cm was tunneled in a retrograde fashion from the anterior chest wall to the venotomy incision. The catheter was then placed through the peel-away sheath with tip ultimately positioned at the RIGHT atrium. Final catheter positioning was confirmed and documented with a spot radiographic image. The catheter aspirates and flushes normally. The catheter was flushed with appropriate volume heparin dwells. The catheter exit site was secured with a 2-0 nylon retention suture. The venotomy incision was closed with Dermabond. Dressings were applied. The patient tolerated the procedure well without immediate post procedural complication. FINDINGS: After catheter placement, the tip lies within the RIGHT atrium the catheter aspirates and flushes normally and is ready for immediate use. IMPRESSION: Successful placement of 25 cm dual lumen tunneled open "Power Line" central venous catheter via the right internal jugular vein with tip terminating in the RIGHT atrium. The catheter is ready for immediate use. Michaelle Birks, MD [facility] Radiology Electronically Signed   By: Michaelle Birks M.D.   On: 12/30/2020 18:05   DG Chest Port 1 View  Result Date: 01/21/2021 CLINICAL DATA:  Hypoxia EXAM: PORTABLE CHEST 1 VIEW COMPARISON:  01/17/2021 chest radiograph. FINDINGS: Stable cardiomediastinal silhouette with top-normal heart size. No pneumothorax. Stable mild blunting of the right costophrenic angle. No left pleural effusion. Mild-to-moderate bibasilar atelectasis, similar. Stable elevation of the right hemidiaphragm. IMPRESSION: 1. Stable mild-to-moderate bibasilar atelectasis. 2. Stable mild blunting of  the right costophrenic angle, cannot exclude small right pleural effusion. Electronically Signed   By: Ilona Sorrel M.D.   On: 01/21/2021 12:06   DG Chest Port 1 View  Result Date: 01/17/2021 CLINICAL DATA:  Leukocytosis EXAM: PORTABLE CHEST 1 VIEW COMPARISON:  Previous studies including the examination of 01/05/2021 FINDINGS: Transverse diameter of heart is increased. There are linear densities in left mid and left lower lung fields with interval worsening. There is marked elevation of right hemidiaphragm. There is homogeneous opacification in the right lower lung fields with no significant interval change. Left lateral CP angle is clear. There is no pneumothorax. Tip of central venous catheter is seen in  superior vena cava. IMPRESSION: Cardiomegaly. Opacification of right mid and lower lung fields has not changed significantly. This may be due to elevation of right hemidiaphragm along with atelectasis and pleural effusion. There is interval increase in linear densities in left mid and left lower lung fields suggesting worsening of subsegmental atelectasis/pneumonitis. Electronically Signed   By: Elmer Picker M.D.   On: 01/17/2021 11:02   DG Chest Port 1 View  Result Date: 01/02/2021 CLINICAL DATA:  Aspiration pneumonia EXAM: PORTABLE CHEST 1 VIEW COMPARISON:  12/28/2020 FINDINGS: Stable complete obscuration of the right heart border due to a combination of elevation of the right hemidiaphragm, atelectasis, possibly underlying pleural effusion as shown on the CT scan from 4 days ago. Mildly improved aeration at the left lung base although some hazy density in the left hemithorax persists and there is some linear bandlike opacities in the left perihilar region. Some of the relative density of the left hemithorax compared to the right is also due to distribution of breast and chest wall tissues during imaging. Right internal jugular central venous catheter tip: SVC. No pneumothorax identified.  IMPRESSION: 1. Mildly improved aeration at the left lung base compared to 01/28/2021. Otherwise stable appearance with elevated right hemidiaphragm, atelectasis, and likely underlying right pleural effusion obscuring the right heart border. 2. Right IJ central line tip: SVC. Electronically Signed   By: Van Clines M.D.   On: 01/02/2021 10:09   DG Chest Port 1 View  Result Date: 12/28/2020 CLINICAL DATA:  Dyspnea EXAM: PORTABLE CHEST 1 VIEW COMPARISON:  12/17/2020 FINDINGS: Mild progression of moderately large right pleural effusion. There is associated collapse or consolidation in the right lung base. Decreased lung volume compared to the prior study. Increase in left lower lobe atelectasis. No edema. IMPRESSION: Hypoventilation Progressive moderately large right pleural effusion and right lower lobe atelectasis. Progressive mild left lower lobe atelectasis. Electronically Signed   By: Franchot Gallo M.D.   On: 12/28/2020 18:07   ECHOCARDIOGRAM COMPLETE  Result Date: 12/29/2020    ECHOCARDIOGRAM REPORT   Patient Name:   SHIESHA JAHN Date of Exam: 12/29/2020 Medical Rec #:  [id number]     Height:       67.0 in Accession #:    [id number]    Weight:       256.6 lb Date of Birth:  1975-06-17     BSA:          2.248 m Patient Age:    70 years      BP:           113/74 mmHg Patient Gender: F             HR:           108 bpm. Exam Location:  [facility] Procedure: 2D Echo, Cardiac Doppler and Color Doppler Indications:     Dyspnea R06.00  History:         Patient has no prior history of Echocardiogram examinations.                  Signs/Symptoms:Anxiety; Risk Factors:Diabetes.  Sonographer:     Sherrie Sport Referring Phys:  [id number] Val Riles Diagnosing Phys: Ida Rogue MD  Sonographer Comments: No apical window and no subcostal window. Pt cant lie on back nor left side. IMPRESSIONS  1. Left ventricular ejection fraction, by estimation, is 60 to 65%. The left ventricle has normal function. The left  ventricle has no regional wall motion abnormalities. Left ventricular diastolic parameters are  indeterminate.  2. Right ventricular systolic function is normal. The right ventricular size is normal. There is severely elevated pulmonary artery systolic pressure. The estimated right ventricular systolic pressure is 50.3 mmHg.  3. The mitral valve is normal in structure. No evidence of mitral valve regurgitation. No evidence of mitral stenosis. FINDINGS  Left Ventricle: Left ventricular ejection fraction, by estimation, is 60 to 65%. The left ventricle has normal function. The left ventricle has no regional wall motion abnormalities. The left ventricular internal cavity size was normal in size. There is  no left ventricular hypertrophy. Left ventricular diastolic parameters are indeterminate. Right Ventricle: The right ventricular size is normal. No increase in right ventricular wall thickness. Right ventricular systolic function is normal. There is severely elevated pulmonary artery systolic pressure. The tricuspid regurgitant velocity is 3.79 m/s, and with an assumed right atrial pressure of 5 mmHg, the estimated right ventricular systolic pressure is 54.6 mmHg. Left Atrium: Left atrial size was normal in size. Right Atrium: Right atrial size was normal in size. Pericardium: There is no evidence of pericardial effusion. Mitral Valve: The mitral valve is normal in structure. No evidence of mitral valve regurgitation. No evidence of mitral valve stenosis. Tricuspid Valve: The tricuspid valve is normal in structure. Tricuspid valve regurgitation is mild . No evidence of tricuspid stenosis. Aortic Valve: The aortic valve was not well visualized. Aortic valve regurgitation is not visualized. No aortic stenosis is present. Pulmonic Valve: The pulmonic valve was normal in structure. Pulmonic valve regurgitation is not visualized. No evidence of pulmonic stenosis. Aorta: The aortic root is normal in size and structure.  Venous: The pulmonary veins were not well visualized. The inferior vena cava was not well visualized. The inferior vena cava is normal in size with greater than 50% respiratory variability, suggesting right atrial pressure of 3 mmHg. IAS/Shunts: No atrial level shunt detected by color flow Doppler.  LEFT VENTRICLE PLAX 2D LVIDd:         4.90 cm LVIDs:         3.10 cm LV PW:         1.20 cm LV IVS:        0.90 cm LVOT diam:     2.00 cm LVOT Area:     3.14 cm  LEFT ATRIUM         Index LA diam:    3.60 cm 1.60 cm/m                        PULMONIC VALVE AORTA                 PV Vmax:        0.91 m/s Ao Root diam: 2.90 cm PV Peak grad:   3.3 mmHg                       RVOT Peak grad: 3 mmHg  TRICUSPID VALVE TR Peak grad:   57.5 mmHg TR Vmax:        379.00 cm/s  SHUNTS Systemic Diam: 2.00 cm Ida Rogue MD Electronically signed by Ida Rogue MD Signature Date/Time: 12/29/2020/1:27:00 PM    Final    [place] EKG SITE RITE  Result Date: 12/30/2020 If Site Rite image not attached, placement could not be confirmed due to current cardiac rhythm.    Assessment and plan- Patient is a 45 y.o. female currently admitted to [facility] for aspiration pneumonia and sepsis currently on her 37th  day of stay.  Heme-onc consulted for persistent leukocytosis despite treatment of infections.  Most recently WBC around 31-34 with absolute neutrophilia.  ANC ranging from 25-28.  Clinically, she is status post IV and oral antibiotics for treatment of pneumonia and UTI.  P.o. Augmentin for aspiration pneumonitis has been discontinued.  Additional labs to evaluate etiology: Peripheral blood smear, LDH, flow cytometry, retic panel, and BCR-ABL1 FISH.   Thank you for this interesting consult. Will continue to follow.   Delaney Meigs, DNP [place] [facility] 01/24/2021    I reviewed the note of Beckey Rutter, NP and agree with the documented findings and plan of care.  I have personally performed a  face-to-face diagnostic evaluation of this patient and independently performed history and physical examination with results as followed:  Persistent leukocytosis despite clinical improvement and resolve sepsis.  No recent baseline prior to current admission available.  She had a normal total white count of 10.5 on 01/22/2019.  On physical examinations, no palpable lymphadenopathy. Most likely reactive, leukemoid reaction.  Rule out other etiologies. Check peripheral blood smear, LDH, peripheral blood flow cytometry, white count is composed of predominantly neutrophilia and  mild basophilia, I will also check BCR ABL FISH.  Earlie Server, MD, PhD [facility] Hematology Oncology 01/24/2021

## 2021-01-24 NOTE — Progress Notes (Signed)
Occupational Therapy Treatment Patient Details Name: [name] MRN: [id number] DOB: [birth year] Today's Date: 01/24/2021   History of present illness 45 y.o. female with medical history significant for Bipolar disorder, diabetes, alcohol use disorder and polysubstance abuse, FSH muscular dystrophy, with history of frequent falls, last seen by neurology in January 2020, who was brought in by EMS after she fell at home 2 days prior and was unable to get up.   OT comments  Pt seen for OT tx this date. Pt eager to shave her legs as part of a seated bath. Pt required set up and supervision for LB and UB bathing (Min A to wash back only) while seated EOB. Pt able to also shave her armpits and legs without assist. Noted to have a couple small cuts from shaving on her legs. RN notified and came to apply some small bandages. Pt SpO2 93% on O2 and HR up to 125 with exertion. Cues for PLB. Left with nurse tech for bed linens change with pt indep with rolling. Pt progressing towards goals, continues to benefit from skilled OT services.    Recommendations for follow up therapy are one component of a multi-disciplinary discharge planning process, led by the attending physician.  Recommendations may be updated based on patient status, additional functional criteria and insurance authorization.    Follow Up Recommendations  Skilled nursing-short term rehab (<3 hours/day)    Assistance Recommended at Discharge    Equipment Recommendations  BSC/3in1    Recommendations for Other Services      Precautions / Restrictions Precautions Precautions: Fall Restrictions Weight Bearing Restrictions: No       Mobility Bed Mobility Overal bed mobility: Modified Independent Bed Mobility: Supine to Sit;Sit to Supine     Supine to sit: Modified independent (Device/Increase time) Sit to supine: Modified independent (Device/Increase time)        Transfers                         Balance Overall  balance assessment: Needs assistance Sitting-balance support: Feet supported;No upper extremity supported Sitting balance-Leahy Scale: Good                                     ADL either performed or assessed with clinical judgement   ADL Overall ADL's : Needs assistance/impaired     Grooming: Sitting;Set up;Supervision/safety   Upper Body Bathing: Sitting;Supervision/ safety;Set up Upper Body Bathing Details (indicate cue type and reason): Min A to wash back, set up otherwise Lower Body Bathing: Sitting/lateral leans;Set up;Supervison/ safety Lower Body Bathing Details (indicate cue type and reason): LB bathing and shaved her legs                            Extremity/Trunk Assessment              Vision       Perception     Praxis      Cognition Arousal/Alertness: Awake/alert Behavior During Therapy: WFL for tasks assessed/performed Overall Cognitive Status: Within Functional Limits for tasks assessed                                            Exercises     Shoulder Instructions  General Comments      Pertinent Vitals/ Pain       Pain Assessment: No/denies pain  Home Living                                          Prior Functioning/Environment              Frequency  Min 2X/week        Progress Toward Goals  OT Goals(current goals can now be found in the care plan section)  Progress towards OT goals: Progressing toward goals  Acute Rehab OT Goals Patient Stated Goal: get stronger OT Goal Formulation: With patient Time For Goal Achievement: 01/30/21 Potential to Achieve Goals: Good  Plan Discharge plan remains appropriate;Frequency remains appropriate    Co-evaluation                 AM-PAC OT "6 Clicks" Daily Activity     Outcome Measure   Help from another person eating meals?: None Help from another person taking care of personal grooming?: None Help  from another person toileting, which includes using toliet, bedpan, or urinal?: A Lot Help from another person bathing (including washing, rinsing, drying)?: A Little Help from another person to put on and taking off regular upper body clothing?: A Little Help from another person to put on and taking off regular lower body clothing?: A Lot 6 Click Score: 18    End of Session Equipment Utilized During Treatment: Oxygen  OT Visit Diagnosis: Unsteadiness on feet (R26.81);Muscle weakness (generalized) (M62.81);History of falling (Z91.81)   Activity Tolerance Patient tolerated treatment well   Patient Left in bed;with call bell/phone within reach;with bed alarm set;with family/visitor present   Nurse Communication Other (comment) (a couple little nicks while shaving legs)        Time: 1353-1416 OT Time Calculation (min): 23 min  Charges: OT General Charges $OT Visit: 1 Visit OT Treatments $Self Care/Home Management : 23-37 mins  Arman Filter., MPH, MS, OTR/L ascom [phone] 01/24/21, 2:22 PM

## 2021-01-24 NOTE — Progress Notes (Signed)
Nutrition Follow Up Note   DOCUMENTATION CODES:   Morbid obesity  INTERVENTION:   Ensure Enlive po to TID, each supplement provides 350 kcal and 20 grams of protein  MVI po daily   NUTRITION DIAGNOSIS:   Inadequate oral intake related to acute illness as evidenced by per patient/family report.  GOAL:   Patient will meet greater than or equal to 90% of their needs -progressing   MONITOR:   PO intake, Supplement acceptance, Labs, Weight trends, Skin, I & O's  ASSESSMENT:   45 y.o. 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 Pt found to have cirrhosis and pulmonary hypertension.   Pt continues to have good appetite and oral intake; pt eating 100% of meals and reports that she is drinking 3 Ensure per day. Per chart, pt is down ~16lbs since admit if bed weights are correct. Plan is for SNF at discharge.   Medications reviewed and include: lovenox, folic acid, lasix, insulin, Mg oxide, MVI, protonix, aldactone, thiamine, vitamin D  Labs reviewed: K 4.1 wnl, BUN 22(H)- 11/7 Wbc- 31.8(H), Hgb 8.7(L), Hct 28.8(L) Cbgs- 188, 167 x 24 hrs  Diet Order:   Diet Order             Diet Carb Modified Fluid consistency: Thin; Room service appropriate? Yes  Diet effective now                  EDUCATION NEEDS:   Education needs have been addressed  Skin:  Skin Assessment: Reviewed RN Assessment  Last BM:  11/8- type 6  Height:   Ht Readings from Last 1 Encounters:  12/17/20 5\' 7"  (1.702 m)    Weight:   Wt Readings from Last 1 Encounters:  01/24/21 98.8 kg    Ideal Body Weight:  61.36 kg  BMI:  Body mass index is 34.11 kg/m.  Estimated Nutritional Needs:   Kcal:  2100-2400kcal/day  Protein:  105-120g/day  Fluid:   1.9-2.2L/day  13/08/22 MS, RD, LDN Please refer to [facility] for RD and/or RD on-call/weekend/after hours pager

## 2021-01-24 NOTE — Progress Notes (Signed)
PROGRESS NOTE    KORRYN PANCOAST  PFX:[id number] DOB: February 06, 1976 DOA: 12/17/2020 PCP: Eunice Blase, PA-C    Brief Narrative:  45 y.o. female with medical history significant for bipolar disorder, type 2 diabetes mellitus, alcohol use disorder, polysubstance use disorder, FSHD muscular dystrophy, history of falls, microscopic colitis on budesonide, who was brought to the hospital because of a fall and inability to get up on her own.  She was found to have sepsis secondary to aspiration pneumonia, severe hyponatremia with sodium level of 113, hypokalemia with potassium of 2.9, hyperbilirubinemia with bilirubin of 11.9 and elevated ammonia of 52.   She was treated with empiric IV antibiotics and IV fluids.  Magnesium, phosphorus and potassium were repleted.  She was placed on 2 L/min oxygen for acute hypoxemic respiratory failure.  She remains on 2 L/min oxygen for chronic hypoxemic respiratory failure.  She was given lactulose and rifaximin for hepatic encephalopathy.  She underwent paracentesis for ascites.  There was no evidence of SBP.  After completing antibiotic therapy (Unasyn and Zyvox), she continued to have persistent leukocytosis and she was found to have UTI.  ID was consulted to assist with management.  11/2: Uptrending leukocytosis.  Downtrending procalcitonin.  T-max 100.2 over the last 24 hours. 11/3: Patient feels well.  Procalcitonin downtrending.  Leukocytosis slight downtrend but persistent.  No fevers or minimal 11/5: Persistent leukocytosis.  Slight uptrend.  Patient had tachycardia and increased oxygen demand over interval. 11/6: Persistent leukocytosis.  Abdominal ultrasound only trace ascites.  Not sufficient for paracentesis 11/7: Slight downtrend in leukocytosis.  Still elevated.  31.5 11/8: White blood cell 31.8.  Augmentin stopped yesterday   Assessment & Plan:   Principal Problem:   Alcohol use disorder, moderate, dependence ([name]) Active Problems:   Alcohol  withdrawal ([name])   Bipolar 2 disorder ([name])   Hypokalemia   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[name])   Acute hyponatremia   Hepatic steatosis   Polysubstance abuse ([name])   Hyperglycemia due to type 2 diabetes mellitus ([name])   Hyperbilirubinemia   Lactic acidosis   Alcoholic liver disease ([name])   Sepsis ([name])   Acute hepatic encephalopathy   Pressure injury of skin   Aspiration pneumonia of both lower lobes due to gastric secretions ([name])  Sepsis secondary to aspiration pneumonia Persistent severe leukocytosis Patient has completed course of IV cefepime on 10/26 All blood cells remain elevated Paracentesis 11/1, negative for SBP Possible aspiration on chest imaging Infectious disease consulted Central line discontinued 11/3 11/5: White blood cell count remains markedly elevated 11.7: White blood cell count 31.5  11/8: WBC 31.8.  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 Acute hepatic encephalopathy, resolved Hypoalbuminemia Pulmonary hypertension Status post paracentesis x4 this admission Most recent 11/1, negative for SBP No signs of hepatic encephalopathy clinical abdominal distention noted on exam however ultrasound negative for sufficient ascites for paracentesis Plan: Continue IV Lasix Continue Aldactone Continue rifaximin Monitor for encephalopathy Defer abdominal ultrasound  Acute on chronic hypoxic respiratory failure Baseline dependent on 2 L, unable to wean Had increasing oxygen demand to 4 L on 11/5 Plan: IV loop diuretics Encourage incentive spirometry use  Bipolar disorder Depression Appears stable Continue current psychiatric regimen  Type 2 diabetes mellitus with hyperglycemia Semglee 28 units twice daily NovoLog 12 units 3 times daily with meals Carb modified diet Diabetes coordinator consult   DVT prophylaxis: SQ  Lovenox Code Status: Full Family Communication:  None Disposition Plan: Status is: Inpatient  Remains inpatient appropriate because: Marked leukocytosis of unclear etiology.  Disposition plan unclear.  Will need skilled nursing facility    Level of care: Med-Surg  Consultants:  ID  Procedures:  None  Antimicrobials:   Subjective: Patient seen and examined.  Resting comfortably in bed.  No visible distress.  No pain complaints.  Tolerating p.o.  Objective: Vitals:   01/23/21 1929 01/24/21 0415 01/24/21 0423 01/24/21 0756  BP: 112/70  116/79 115/67  Pulse: (!) 106  (!) 108 (!) 118  Resp: 20  20   Temp: 98.7 F (37.1 C)  98.5 F (36.9 C) 98 F (36.7 C)  TempSrc: Oral  Oral Oral  SpO2: 96%  95% 94%  Weight:  98.8 kg    Height:        Intake/Output Summary (Last 24 hours) at 01/24/2021 1036 Last data filed at 01/24/2021 0525 Gross per 24 hour  Intake 0 ml  Output 2200 ml  Net -2200 ml   Filed Weights   01/22/21 0347 01/23/21 0423 01/24/21 0415  Weight: 97.8 kg 95.6 kg 98.8 kg    Examination:  General exam: No apparent distress.  Appears chronically ill Respiratory system: Bibasilar crackles.  Normal work of breathing.  2 L Cardiovascular system: S1-S2, RRR, no murmurs, no pedal edema Gastrointestinal system: Soft, distended with positive fluid wave, normal bowel sounds Central nervous system: Alert and oriented. No focal neurological deficits. Extremities: Symmetric 5 x 5 power. Skin: No rashes, lesions or ulcers Psychiatry: Judgement and insight appear normal. Mood & affect appropriate.     Data Reviewed: I have personally reviewed following labs and imaging studies  CBC: Recent Labs  Lab 01/20/21 0841 01/21/21 0526 01/22/21 0402 01/23/21 0745 01/24/21 0501  WBC 32.3* 34.5* 34.2* 31.5* 31.8*  NEUTROABS 26.8* 28.8* 28.3* 25.3* 25.8*  HGB 9.9* 8.6* 8.4* 8.7* 8.7*  HCT 32.9* 28.0* 27.5* 28.3* 28.8*  MCV 113.8* 110.7* 110.0* 110.5* 109.1*  PLT 259  277 258 279 [id number]   Basic Metabolic Panel: Recent Labs  Lab 01/18/21 0510 01/19/21 0840 01/20/21 0841 01/23/21 0427  NA 136 134* 137 135  K 3.9 3.6 3.3* 4.1  CL 93* 90* 94* 96*  CO2 35* 34* 32 27  GLUCOSE 231* 107* 131* 167*  BUN 22* 21* 24* 22*  CREATININE 0.58 0.69 0.70 0.72  CALCIUM 8.3* 7.8* 8.5* 8.4*  MG 1.7  --   --   --    GFR: Estimated Creatinine Clearance: 108.4 mL/min (by C-G formula based on SCr of 0.72 mg/dL). Liver Function Tests: Recent Labs  Lab 01/23/21 0427  AST 87*  ALT 25  ALKPHOS 371*  BILITOT 4.3*  PROT 7.1  ALBUMIN 1.6*    No results for input(s): LIPASE, AMYLASE in the last 168 hours. No results for input(s): AMMONIA in the last 168 hours. Coagulation Profile: No results for input(s): INR, PROTIME in the last 168 hours. Cardiac Enzymes: No results for input(s): CKTOTAL, CKMB, CKMBINDEX, TROPONINI in the last 168 hours. BNP (last 3 results) No results for input(s): PROBNP in the last 8760 hours. HbA1C: No results for input(s): HGBA1C in the last 72 hours. CBG: Recent Labs  Lab 01/23/21 0734 01/23/21 1136 01/23/21 1635 01/23/21 2109 01/24/21 0753  GLUCAP 180* 173* 251* 156* 188*   Lipid Profile: No results for input(s): CHOL, HDL, LDLCALC, TRIG, CHOLHDL, LDLDIRECT in the last 72 hours. Thyroid Function Tests: No results for input(s): TSH, T4TOTAL, FREET4, T3FREE, THYROIDAB in the last 72  hours. Anemia Panel: No results for input(s): VITAMINB12, FOLATE, FERRITIN, TIBC, IRON, RETICCTPCT in the last 72 hours. Sepsis Labs: Recent Labs  Lab 01/18/21 0510 01/19/21 0541  PROCALCITON 2.06 1.95    Recent Results (from the past 240 hour(s))  Body fluid culture w Gram Stain     Status: None   Collection Time: 01/17/21 11:15 AM   Specimen: PATH Cytology Peritoneal fluid  Result Value Ref Range Status   Specimen Description   Final    PERITONEAL Performed at [facility], [address], [place], [place] [address]    Special  Requests   Final    NONE Performed at [facility], [address] [place], [place] [address]    Gram Stain NO WBC SEEN NO ORGANISMS SEEN   Final   Culture   Final    NO GROWTH 3 DAYS Performed at [facility], [place] [address], [place], [place] [address]    Report Status 01/21/2021 FINAL  Final  MRSA Next Gen by PCR, Nasal     Status: None   Collection Time: 01/17/21  4:07 PM   Specimen: Nasal Mucosa; Nasal Swab  Result Value Ref Range Status   MRSA by PCR Next Gen NOT DETECTED NOT DETECTED Final    Comment: (NOTE) The GeneXpert MRSA Assay (FDA approved for NASAL specimens only), is one component of a comprehensive MRSA colonization surveillance program. It is not intended to diagnose MRSA infection nor to guide or monitor treatment for MRSA infections. Test performance is not FDA approved in patients less than 65 years old. Performed at [facility], [address], [place], [place] [address]          Radiology Studies: No results found.      Scheduled Meds:  amoxicillin-clavulanate  1 tablet Oral Q12H   budesonide  9 mg Oral Daily   enoxaparin (LOVENOX) injection  0.5 mg/kg Subcutaneous Q24H   feeding supplement  237 mL Oral TID BM   FLUoxetine  80 mg Oral Daily   fluticasone  1 spray Each Nare Daily   folic acid  1 mg Oral Daily   insulin aspart  0-20 Units Subcutaneous TID WC   insulin aspart  0-5 Units Subcutaneous QHS   insulin aspart  12 Units Subcutaneous TID WC   insulin glargine-yfgn  28 Units Subcutaneous BID   magnesium oxide  400 mg Oral BID   multivitamin with minerals  1 tablet Oral Daily   OLANZapine  5 mg Oral QHS   pantoprazole  40 mg Oral BID   pregabalin  75 mg Oral TID   rifaximin  550 mg Oral BID   spironolactone  100 mg Oral Daily   thiamine  100 mg Oral Daily   Vitamin D (Ergocalciferol)  50,000 Units Oral Q7 days   Continuous Infusions:  sodium chloride 10 mL/hr at 01/19/21 0416     LOS: 37 days     Time spent: 25 minutes    Sidney Ace, MD [facility]   If 7PM-7AM, please contact night-coverage  01/24/2021, 10:36 AM

## 2021-01-25 LAB — CBC WITH DIFFERENTIAL/PLATELET
Abs Immature Granulocytes: 0.63 10*3/uL — ABNORMAL HIGH (ref 0.00–0.07)
Basophils Absolute: 0.1 10*3/uL (ref 0.0–0.1)
Basophils Relative: 0 %
Eosinophils Absolute: 0.3 10*3/uL (ref 0.0–0.5)
Eosinophils Relative: 1 %
HCT: 27.9 % — ABNORMAL LOW (ref 36.0–46.0)
Hemoglobin: 8.6 g/dL — ABNORMAL LOW (ref 12.0–15.0)
Immature Granulocytes: 2 %
Lymphocytes Relative: 11 %
Lymphs Abs: 3.5 10*3/uL (ref 0.7–4.0)
MCH: 34.1 pg — ABNORMAL HIGH (ref 26.0–34.0)
MCHC: 30.8 g/dL (ref 30.0–36.0)
MCV: 110.7 fL — ABNORMAL HIGH (ref 80.0–100.0)
Monocytes Absolute: 1 10*3/uL (ref 0.1–1.0)
Monocytes Relative: 3 %
Neutro Abs: 25.3 10*3/uL — ABNORMAL HIGH (ref 1.7–7.7)
Neutrophils Relative %: 83 %
Platelets: 300 10*3/uL (ref 150–400)
RBC: 2.52 MIL/uL — ABNORMAL LOW (ref 3.87–5.11)
RDW: 15.7 % — ABNORMAL HIGH (ref 11.5–15.5)
Smear Review: NORMAL
WBC: 30.8 10*3/uL — ABNORMAL HIGH (ref 4.0–10.5)
nRBC: 0 % (ref 0.0–0.2)

## 2021-01-25 LAB — GLUCOSE, CAPILLARY
Glucose-Capillary: 138 mg/dL — ABNORMAL HIGH (ref 70–99)
Glucose-Capillary: 226 mg/dL — ABNORMAL HIGH (ref 70–99)
Glucose-Capillary: 231 mg/dL — ABNORMAL HIGH (ref 70–99)
Glucose-Capillary: 178 mg/dL — ABNORMAL HIGH (ref 70–99)

## 2021-01-25 LAB — RETIC PANEL
Immature Retic Fract: 38.4 % — ABNORMAL HIGH (ref 2.3–15.9)
RBC.: 2.58 MIL/uL — ABNORMAL LOW (ref 3.87–5.11)
Retic Count, Absolute: 160 10*3/uL (ref 19.0–186.0)
Retic Ct Pct: 6.2 % — ABNORMAL HIGH (ref 0.4–3.1)
Reticulocyte Hemoglobin: 29.8 pg (ref >27.9)

## 2021-01-25 LAB — PATHOLOGIST SMEAR REVIEW

## 2021-01-25 MED ORDER — FUROSEMIDE 40 MG PO TABS
40.0000 mg | ORAL_TABLET | Freq: Every day | ORAL | Status: DC
  Administered 2021-01-25 – 2021-01-26 (×2): 40 mg via ORAL
  Filled 2021-01-25 (×2): qty 1

## 2021-01-25 NOTE — Progress Notes (Signed)
   01/25/21 1120  Clinical Encounter Type  Visited With Patient  Visit Type Initial;Spiritual support;Social support  Referral From [employer] visited PT and offered emotional support. PT spoke of her health challenges being "daunting". Chaplain made space for expression of emotions. PT began to eat lunch, so chaplain will try to follow back up.  Posey Boyer, M. Div.

## 2021-01-25 NOTE — Progress Notes (Signed)
Physical Therapy Treatment Patient Details Name: [name] MRN: [id number] DOB: 06-02-1975 Today's Date: 01/25/2021   History of Present Illness 45 y.o. female with medical history significant for Bipolar disorder, diabetes, alcohol use disorder and polysubstance abuse, FSH muscular dystrophy, with history of frequent falls, last seen by neurology in January 2020, who was brought in by EMS after she fell at home 2 days prior and was unable to get up.   PT Comments    Pt was pleasant and motivated to participate during the session and put forth good effort throughout. Pt was able to complete all ther ex in recliner. Pt required min assist with lateral scoot from bed to recliner. Pt was excited to try and take steps today, but was unable to perform a STS from recliner with +2 max assist, but was agreeable to stay in recliner until OT comes this afternoon. Pt will benefit from PT services in a SNF setting upon discharge to safely address deficits listed in patient problem list for decreased caregiver assistance and eventual return to PLOF.   Recommendations for follow up therapy are one component of a multi-disciplinary discharge planning process, led by the attending physician.  Recommendations may be updated based on patient status, additional functional criteria and insurance authorization.  Follow Up Recommendations  Skilled nursing-short term rehab (<3 hours/day)     Assistance Recommended at Discharge Frequent or constant Supervision/Assistance  Equipment Recommendations  None recommended by PT    Recommendations for Other Services       Precautions / Restrictions Precautions Precautions: Fall Restrictions Weight Bearing Restrictions: No     Mobility  Bed Mobility Overal bed mobility: Needs Assistance Bed Mobility: Supine to Sit     Supine to sit: Supervision     General bed mobility comments: increased effort/time    Transfers Overall transfer level: Needs  assistance Equipment used: Rolling walker (2 wheels) Transfers: Sit to/from Stand Sit to Stand: Max assist;+2 physical assistance          Lateral/Scoot Transfers: Min assist General transfer comment: Lateral scoot to chair to attmept STS from recliner, pt attempted STS but was unable to even with +2 max assist    Ambulation/Gait               General Gait Details: Deferred for today's session   Stairs             Wheelchair Mobility    Modified Rankin (Stroke Patients Only)       Balance Overall balance assessment: Needs assistance Sitting-balance support: Feet supported;Bilateral upper extremity supported Sitting balance-Leahy Scale: Good   Postural control: Left lateral lean     Standing balance comment: Unable to stand today                            Cognition Arousal/Alertness: Awake/alert Behavior During Therapy: WFL for tasks assessed/performed Overall Cognitive Status: Within Functional Limits for tasks assessed                                          Exercises Total Joint Exercises Ankle Circles/Pumps: AROM;Strengthening;Both;10 reps;Seated Quad Sets: AROM;Strengthening;Both;10 reps;Seated Gluteal Sets: AROM;Strengthening;Both;10 reps;Seated Towel Squeeze: AROM;Strengthening;Both;10 reps;Seated Long Arc Quad: AROM;Both;Seated;10 reps;Strengthening Other Exercises Other Exercises: Pt education on STS sequencing from recliner    General Comments        Pertinent  Vitals/Pain Pain Assessment: No/denies pain    Home Living                          Prior Function            PT Goals (current goals can now be found in the care plan section) Acute Rehab PT Goals Patient Stated Goal: to eventually walk again PT Goal Formulation: With patient Time For Goal Achievement: 02/01/21 Potential to Achieve Goals: Fair Progress towards PT goals: Progressing toward goals    Frequency    Min  2X/week      PT Plan Current plan remains appropriate    Co-evaluation              AM-PAC PT "6 Clicks" Mobility   Outcome Measure  Help needed turning from your back to your side while in a flat bed without using bedrails?: A Little Help needed moving from lying on your back to sitting on the side of a flat bed without using bedrails?: A Little Help needed moving to and from a bed to a chair (including a wheelchair)?: A Lot Help needed standing up from a chair using your arms (e.g., wheelchair or bedside chair)?: A Lot Help needed to walk in hospital room?: Total Help needed climbing 3-5 steps with a railing? : Total 6 Click Score: 12    End of Session Equipment Utilized During Treatment: Gait belt Activity Tolerance: Patient limited by fatigue;Patient tolerated treatment well Patient left: in chair;with call bell/phone within reach (No where to plug chair alarm into) Nurse Communication: Mobility status PT Visit Diagnosis: Muscle weakness (generalized) (M62.81);Difficulty in walking, not elsewhere classified (R26.2);Unsteadiness on feet (R26.81)     Time: 2094-7096 PT Time Calculation (min) (ACUTE ONLY): 23 min  Charges:  $Therapeutic Exercise: 8-22 mins $Therapeutic Activity: 8-22 mins                     Hildred Alamin SPT 01/25/21, 1:34 PM

## 2021-01-25 NOTE — Progress Notes (Addendum)
PROGRESS NOTE    [name]  [id number] DOB: 17-Dec-1975 DOA: 12/17/2020 PCP: Janine Limbo, PA-C    Brief Narrative:  45 y.o. female with medical history significant for bipolar disorder, type 2 diabetes mellitus, alcohol use disorder, polysubstance use disorder, FSHD muscular dystrophy, history of falls, microscopic colitis on budesonide, who was brought to the hospital because of a fall and inability to get up on her own.  She was found to have sepsis secondary to aspiration pneumonia, severe hyponatremia with sodium level of 113, hypokalemia with potassium of 2.9, hyperbilirubinemia with bilirubin of 11.9 and elevated ammonia of 52.   She was treated with empiric IV antibiotics and IV fluids.  Magnesium, phosphorus and potassium were repleted.  She was placed on 2 L/min oxygen for acute hypoxemic respiratory failure.  She remains on 2 L/min oxygen for chronic hypoxemic respiratory failure.  She was given lactulose and rifaximin for hepatic encephalopathy.  She underwent paracentesis for ascites.  There was no evidence of SBP.  After completing antibiotic therapy (Unasyn and Zyvox), she continued to have persistent leukocytosis and she was found to have UTI.  ID was consulted to assist with management.  11/2: Uptrending leukocytosis.  Downtrending procalcitonin.  T-max 100.2 over the last 24 hours. 11/3: Patient feels well.  Procalcitonin downtrending.  Leukocytosis slight downtrend but persistent.  No fevers or minimal 11/5: Persistent leukocytosis.  Slight uptrend.  Patient had tachycardia and increased oxygen demand over interval. 11/6: Persistent leukocytosis.  Abdominal ultrasound only trace ascites.  Not sufficient for paracentesis 11/7: Slight downtrend in leukocytosis.  Still elevated.  31.5 11/8: White blood cell 31.8.  Augmentin stopped yesterday   Assessment & Plan:   Principal Problem:   Alcohol use disorder, moderate, dependence ([name]) Active Problems:   Alcohol  withdrawal ([name])   Bipolar 2 disorder ([name])   Hypokalemia   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[place])   Acute hyponatremia   Hepatic steatosis   Polysubstance abuse ([name])   Hyperglycemia due to type 2 diabetes mellitus ([name])   Hyperbilirubinemia   Lactic acidosis   Alcoholic liver disease ([name])   Sepsis ([name])   Acute hepatic encephalopathy   Pressure injury of skin   Aspiration pneumonia of both lower lobes due to gastric secretions ([name])   Leukocytosis  Sepsis secondary to aspiration pneumonia Persistent severe leukocytosis Patient has completed course of IV cefepime transitioned to augmentin All blood cells remain elevated Paracentesis 11/1, negative for SBP Infectious disease consulted, does not think active infection Hematology consulted 11/8, w/u pending, thinks like leukamoid reaction Central line discontinued 11/3 Plan: Defer antibiotics Daily CBC Will message heme about d/c planning, possible f/u can occur as outpt  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 Acute hepatic encephalopathy, resolved Hypoalbuminemia Pulmonary hypertension Status post paracentesis x4 this admission Most recent 11/1, negative for SBP No signs of hepatic encephalopathy clinical abdominal distention noted on exam however ultrasound negative for sufficient ascites for paracentesis Plan: Continue lasix Continue Aldactone Continue rifaximin Monitor for encephalopathy Defer abdominal ultrasound  Acute on chronic hypoxic respiratory failure Baseline dependent on 2 L, unable to wean Had increasing oxygen demand to 4 L on 11/5 Plan: Encourage incentive spirometry use - [place] o2, wean as able  Bipolar disorder Depression Appears stable Continue current psychiatric regimen  Type 2 diabetes mellitus with hyperglycemia Semglee 28 units twice daily NovoLog 12 units 3 times daily with meals Carb modified diet Diabetes coordinator consult   DVT prophylaxis: SQ Lovenox Code Status:  Full Family Communication: None Disposition  Plan: Status is: Inpatient  Remains inpatient appropriate because: unsafe d/c plan, ongoing w/u of leukocytosis    Level of care: Med-Surg  Consultants:  ID, heme  Procedures:  Paracentesis x4  Antimicrobials:   Subjective: Patient seen and examined.  Resting comfortably in bed.  No visible distress.  No pain complaints.  Tolerating p.o.  Objective: Vitals:   01/24/21 1939 01/25/21 0325 01/25/21 0427 01/25/21 0741  BP: 113/72  112/74 110/80  Pulse: (!) 102  (!) 102 (!) 107  Resp: 16  18   Temp: 97.8 F (36.6 C)  98.1 F (36.7 C) (!) 97.5 F (36.4 C)  TempSrc: Oral  Oral Oral  SpO2: 92%  93% 96%  Weight:  99.5 kg    Height:        Intake/Output Summary (Last 24 hours) at 01/25/2021 1538 Last data filed at 01/25/2021 0648 Gross per 24 hour  Intake 0 ml  Output 450 ml  Net -450 ml   Filed Weights   01/23/21 0423 01/24/21 0415 01/25/21 0325  Weight: 95.6 kg 98.8 kg 99.5 kg    Examination:  General exam: No apparent distress.  Appears chronically ill Respiratory system: Bibasilar crackles.  Normal work of breathing.  2 L Cardiovascular system: S1-S2, RRR, no murmurs, no pedal edema Gastrointestinal system: Soft, mod distended,normal bowel sounds Central nervous system: Alert and oriented. No focal neurological deficits. Extremities: Symmetric 5 x 5 power. Skin: No rashes, lesions or ulcers Psychiatry: Judgement and insight appear normal. Mood & affect appropriate.     Data Reviewed: I have personally reviewed following labs and imaging studies  CBC: Recent Labs  Lab 01/21/21 0526 01/22/21 0402 01/23/21 0745 01/24/21 0501 01/25/21 0617  WBC 34.5* 34.2* 31.5* 31.8* 30.8*  NEUTROABS 28.8* 28.3* 25.3* 25.8* 25.3*  HGB 8.6* 8.4* 8.7* 8.7* 8.6*  HCT 28.0* 27.5* 28.3* 28.8* 27.9*  MCV 110.7* 110.0* 110.5* 109.1* 110.7*  PLT 277 258 279 292 300   Basic Metabolic Panel: Recent Labs  Lab 01/19/21 0840  01/20/21 0841 01/23/21 0427  NA 134* 137 135  K 3.6 3.3* 4.1  CL 90* 94* 96*  CO2 34* 32 27  GLUCOSE 107* 131* 167*  BUN 21* 24* 22*  CREATININE 0.69 0.70 0.72  CALCIUM 7.8* 8.5* 8.4*   GFR: Estimated Creatinine Clearance: 108.8 mL/min (by C-G formula based on SCr of 0.72 mg/dL). Liver Function Tests: Recent Labs  Lab 01/23/21 0427  AST 87*  ALT 25  ALKPHOS 371*  BILITOT 4.3*  PROT 7.1  ALBUMIN 1.6*    No results for input(s): LIPASE, AMYLASE in the last 168 hours. No results for input(s): AMMONIA in the last 168 hours. Coagulation Profile: No results for input(s): INR, PROTIME in the last 168 hours. Cardiac Enzymes: No results for input(s): CKTOTAL, CKMB, CKMBINDEX, TROPONINI in the last 168 hours. BNP (last 3 results) No results for input(s): PROBNP in the last 8760 hours. HbA1C: No results for input(s): HGBA1C in the last 72 hours. CBG: Recent Labs  Lab 01/24/21 1132 01/24/21 1633 01/24/21 2157 01/25/21 0740 01/25/21 1129  GLUCAP 167* 180* 177* 138* 226*   Lipid Profile: No results for input(s): CHOL, HDL, LDLCALC, TRIG, CHOLHDL, LDLDIRECT in the last 72 hours. Thyroid Function Tests: No results for input(s): TSH, T4TOTAL, FREET4, T3FREE, THYROIDAB in the last 72 hours. Anemia Panel: Recent Labs    01/25/21 0617  RETICCTPCT 6.2*   Sepsis Labs: Recent Labs  Lab 01/19/21 0541  PROCALCITON 1.95    Recent Results (from the  past 240 hour(s))  Body fluid culture w Gram Stain     Status: None   Collection Time: 01/17/21 11:15 AM   Specimen: PATH Cytology Peritoneal fluid  Result Value Ref Range Status   Specimen Description   Final    PERITONEAL Performed at [facility], [address] [place], [place] [address]    Special Requests   Final    NONE Performed at [facility], [place], [place] [address]    Gram Stain NO WBC SEEN NO ORGANISMS SEEN   Final   Culture   Final    NO GROWTH 3 DAYS Performed at  [facility], [place] [address], [place], [place] [address]    Report Status 01/21/2021 FINAL  Final  MRSA Next Gen by PCR, Nasal     Status: None   Collection Time: 01/17/21  4:07 PM   Specimen: Nasal Mucosa; Nasal Swab  Result Value Ref Range Status   MRSA by PCR Next Gen NOT DETECTED NOT DETECTED Final    Comment: (NOTE) The GeneXpert MRSA Assay (FDA approved for NASAL specimens only), is one component of a comprehensive MRSA colonization surveillance program. It is not intended to diagnose MRSA infection nor to guide or monitor treatment for MRSA infections. Test performance is not FDA approved in patients less than 106 years old. Performed at [facility], [address] [place],  [address]          Radiology Studies: No results found.      Scheduled Meds:  budesonide  9 mg Oral Daily   enoxaparin (LOVENOX) injection  0.5 mg/kg Subcutaneous Q24H   feeding supplement  237 mL Oral TID BM   FLUoxetine  80 mg Oral Daily   fluticasone  1 spray Each Nare Daily   folic acid  1 mg Oral Daily   insulin aspart  0-20 Units Subcutaneous TID WC   insulin aspart  0-5 Units Subcutaneous QHS   insulin aspart  12 Units Subcutaneous TID WC   insulin glargine-yfgn  28 Units Subcutaneous BID   magnesium oxide  400 mg Oral BID   multivitamin with minerals  1 tablet Oral Daily   OLANZapine  5 mg Oral QHS   pantoprazole  40 mg Oral BID   pregabalin  75 mg Oral TID   rifaximin  550 mg Oral BID   spironolactone  100 mg Oral Daily   thiamine  100 mg Oral Daily   Vitamin D (Ergocalciferol)  50,000 Units Oral Q7 days   Continuous Infusions:  sodium chloride 10 mL/hr at 01/19/21 0416     LOS: 38 days    Time spent: 30 minutes    Desma Maxim, MD [facility]   If 7PM-7AM, please contact night-coverage  01/25/2021, 3:38 PM

## 2021-01-25 NOTE — TOC Progression Note (Signed)
Transition of Care [facility]) - Progression Note    Patient Details  Name: [name] MRN: [id number] Date of Birth: January 07, 1976  [facility] of [facility]) CM/SW Contact  Margarito Liner, LCSW Phone Number: 01/25/2021, 3:45 PM   Clinical Narrative:   Left voicemail for [facility] admissions coordinator to see when they would have a bed available. Will start insurance authorization once she calls back.  Expected Discharge Plan: Skilled Nursing Facility Barriers to Discharge: Continued Medical Work up  Expected Discharge Plan and Services Expected Discharge Plan: Skilled Nursing Facility     Post Acute Care Choice: Skilled Nursing Facility Living arrangements for the past 2 months: Single Family Home                                       Social Determinants of Health (SDOH) Interventions    Readmission Risk Interventions No flowsheet data found.

## 2021-01-25 NOTE — Progress Notes (Signed)
Occupational Therapy Treatment Patient Details Name: [name] MRN: [id number] DOB: 02-26-1976 Today's Date: 01/25/2021   History of present illness 45 y.o. female with medical history significant for Bipolar disorder, diabetes, alcohol use disorder and polysubstance abuse, FSH muscular dystrophy, with history of frequent falls, last seen by neurology in January 2020, who was brought in by EMS after she fell at home 2 days prior and was unable to get up.   OT comments  Pt seen for OT tx this date. OT engages pt in MOD A +2 with RW STS from recliner with cues for hand placement and posterior support to bring hips into extension. Pt requires MIN A to lateral scoot from drop-arm recliner to bed adjacent. OT engages pt in peri care with MIN A to SETUP and encourages pt to be more proactive about skin integrity including cleaning, drying and applying barrier cream to anterior peri area since she is able to achieve this with SETUP only. Pt able to achieve supine with MOD I. Left in bed with all needs met and in reach. Visitor present. Will continue to follow per OT POC.   Recommendations for follow up therapy are one component of a multi-disciplinary discharge planning process, led by the attending physician.  Recommendations may be updated based on patient status, additional functional criteria and insurance authorization.    Follow Up Recommendations  Skilled nursing-short term rehab (<3 hours/day)    Assistance Recommended at Discharge    Equipment Recommendations  BSC/3in1    Recommendations for Other Services      Precautions / Restrictions Precautions Precautions: Fall Precaution Comments: increasing WBCs Restrictions Weight Bearing Restrictions: No       Mobility Bed Mobility Overal bed mobility: Needs Assistance Bed Mobility: Sit to Supine       Sit to supine: Modified independent (Device/Increase time)   General bed mobility comments: increased effort/time     Transfers Overall transfer level: Needs assistance Equipment used: Rolling walker (2 wheels) Transfers: Sit to/from Stand Sit to Stand: Mod assist;+2 physical assistance           General transfer comment: Pt able to CTS with MOD A +2 (requires posterior support under hips/buttocks in order to bring hips into extension to acheive standing, esp from lower surface of recliner. MIN A for lateral scoot transfer from chair to bed.     Balance Overall balance assessment: Needs assistance Sitting-balance support: Feet supported;Bilateral upper extremity supported Sitting balance-Leahy Scale: Good Sitting balance - Comments: keeps B hands for support on bed   Standing balance support: Bilateral upper extremity supported;During functional activity;Reliant on assistive device for balance Standing balance-Leahy Scale: Poor                             ADL either performed or assessed with clinical judgement   ADL Overall ADL's : Needs assistance/impaired             Lower Body Bathing: Minimal assistance;Bed level Lower Body Bathing Details (indicate cue type and reason): for anterior peri area, ed re: prevention of skin breakdown by being proactive herself rather than dep on nursing for what she can do herself in bed with SETUP                            Extremity/Trunk Assessment              Vision Patient Visual Report:  No change from baseline     Perception     Praxis      Cognition Arousal/Alertness: Awake/alert Behavior During Therapy: WFL for tasks assessed/performed Overall Cognitive Status: Within Functional Limits for tasks assessed                                            Exercises Other Exercises Other Exercises: OT ed with pt re: tending to peri area herself instead of always wainting on nrusing in terms of ensuring that the area is clean, dry and barrier cream applied as needed for redness/irritation.    Shoulder Instructions       General Comments      Pertinent Vitals/ Pain       Pain Assessment: No/denies pain Faces Pain Scale: Hurts little more Pain Location: Bilat hips Pain Descriptors / Indicators: Discomfort Pain Intervention(s): Repositioned  Home Living                                          Prior Functioning/Environment              Frequency  Min 2X/week        Progress Toward Goals  OT Goals(current goals can now be found in the care plan section)  Progress towards OT goals: Progressing toward goals  Acute Rehab OT Goals Patient Stated Goal: get stronger OT Goal Formulation: With patient Time For Goal Achievement: 01/30/21 Potential to Achieve Goals: Good  Plan Discharge plan remains appropriate;Frequency remains appropriate    Co-evaluation                 AM-PAC OT "6 Clicks" Daily Activity     Outcome Measure   Help from another person eating meals?: None Help from another person taking care of personal grooming?: None Help from another person toileting, which includes using toliet, bedpan, or urinal?: A Lot Help from another person bathing (including washing, rinsing, drying)?: A Little Help from another person to put on and taking off regular upper body clothing?: A Little Help from another person to put on and taking off regular lower body clothing?: A Lot 6 Click Score: 18    End of Session Equipment Utilized During Treatment: Oxygen  OT Visit Diagnosis: Unsteadiness on feet (R26.81);Muscle weakness (generalized) (M62.81);History of falling (Z91.81)   Activity Tolerance Patient tolerated treatment well   Patient Left in bed;with call bell/phone within reach;with bed alarm set;with family/visitor present   Nurse Communication          Time: 1761-6073 OT Time Calculation (min): 23 min  Charges: OT General Charges $OT Visit: 1 Visit OT Treatments $Self Care/Home Management : 8-22 mins $Therapeutic  Activity: 8-22 mins  Gerrianne Scale, Zeb, OTR/L ascom [phone] 01/25/21, 7:21 PM

## 2021-01-26 DIAGNOSIS — E1165 Type 2 diabetes mellitus with hyperglycemia: Secondary | ICD-10-CM | POA: Diagnosis not present

## 2021-01-26 DIAGNOSIS — D72829 Elevated white blood cell count, unspecified: Secondary | ICD-10-CM | POA: Diagnosis not present

## 2021-01-26 DIAGNOSIS — E119 Type 2 diabetes mellitus without complications: Secondary | ICD-10-CM | POA: Diagnosis not present

## 2021-01-26 DIAGNOSIS — G7249 Other inflammatory and immune myopathies, not elsewhere classified: Secondary | ICD-10-CM | POA: Diagnosis not present

## 2021-01-26 DIAGNOSIS — K219 Gastro-esophageal reflux disease without esophagitis: Secondary | ICD-10-CM | POA: Diagnosis not present

## 2021-01-26 DIAGNOSIS — Z801 Family history of malignant neoplasm of trachea, bronchus and lung: Secondary | ICD-10-CM | POA: Diagnosis not present

## 2021-01-26 DIAGNOSIS — R5383 Other fatigue: Secondary | ICD-10-CM | POA: Diagnosis not present

## 2021-01-26 DIAGNOSIS — Z881 Allergy status to other antibiotic agents status: Secondary | ICD-10-CM | POA: Diagnosis not present

## 2021-01-26 DIAGNOSIS — L8990 Pressure ulcer of unspecified site, unspecified stage: Secondary | ICD-10-CM | POA: Diagnosis not present

## 2021-01-26 DIAGNOSIS — R2689 Other abnormalities of gait and mobility: Secondary | ICD-10-CM | POA: Diagnosis not present

## 2021-01-26 DIAGNOSIS — R0602 Shortness of breath: Secondary | ICD-10-CM | POA: Diagnosis not present

## 2021-01-26 DIAGNOSIS — Z882 Allergy status to sulfonamides status: Secondary | ICD-10-CM | POA: Diagnosis not present

## 2021-01-26 DIAGNOSIS — F1020 Alcohol dependence, uncomplicated: Secondary | ICD-10-CM | POA: Diagnosis not present

## 2021-01-26 DIAGNOSIS — Z7952 Long term (current) use of systemic steroids: Secondary | ICD-10-CM | POA: Diagnosis not present

## 2021-01-26 DIAGNOSIS — Z7401 Bed confinement status: Secondary | ICD-10-CM | POA: Diagnosis not present

## 2021-01-26 DIAGNOSIS — Z886 Allergy status to analgesic agent status: Secondary | ICD-10-CM | POA: Diagnosis not present

## 2021-01-26 DIAGNOSIS — K760 Fatty (change of) liver, not elsewhere classified: Secondary | ICD-10-CM | POA: Diagnosis not present

## 2021-01-26 DIAGNOSIS — Z23 Encounter for immunization: Secondary | ICD-10-CM | POA: Diagnosis not present

## 2021-01-26 DIAGNOSIS — Z833 Family history of diabetes mellitus: Secondary | ICD-10-CM | POA: Diagnosis not present

## 2021-01-26 DIAGNOSIS — J690 Pneumonitis due to inhalation of food and vomit: Secondary | ICD-10-CM | POA: Diagnosis not present

## 2021-01-26 DIAGNOSIS — K582 Mixed irritable bowel syndrome: Secondary | ICD-10-CM | POA: Diagnosis not present

## 2021-01-26 DIAGNOSIS — R531 Weakness: Secondary | ICD-10-CM | POA: Diagnosis not present

## 2021-01-26 DIAGNOSIS — Z743 Need for continuous supervision: Secondary | ICD-10-CM | POA: Diagnosis not present

## 2021-01-26 DIAGNOSIS — F17210 Nicotine dependence, cigarettes, uncomplicated: Secondary | ICD-10-CM | POA: Diagnosis not present

## 2021-01-26 DIAGNOSIS — Z823 Family history of stroke: Secondary | ICD-10-CM | POA: Diagnosis not present

## 2021-01-26 DIAGNOSIS — N390 Urinary tract infection, site not specified: Secondary | ICD-10-CM | POA: Diagnosis not present

## 2021-01-26 DIAGNOSIS — J90 Pleural effusion, not elsewhere classified: Secondary | ICD-10-CM | POA: Diagnosis not present

## 2021-01-26 DIAGNOSIS — Z79899 Other long term (current) drug therapy: Secondary | ICD-10-CM | POA: Diagnosis not present

## 2021-01-26 DIAGNOSIS — D539 Nutritional anemia, unspecified: Secondary | ICD-10-CM | POA: Diagnosis not present

## 2021-01-26 DIAGNOSIS — M6281 Muscle weakness (generalized): Secondary | ICD-10-CM | POA: Diagnosis not present

## 2021-01-26 DIAGNOSIS — K7011 Alcoholic hepatitis with ascites: Secondary | ICD-10-CM | POA: Diagnosis not present

## 2021-01-26 DIAGNOSIS — Z794 Long term (current) use of insulin: Secondary | ICD-10-CM | POA: Diagnosis not present

## 2021-01-26 DIAGNOSIS — Z8249 Family history of ischemic heart disease and other diseases of the circulatory system: Secondary | ICD-10-CM | POA: Diagnosis not present

## 2021-01-26 DIAGNOSIS — K7682 Hepatic encephalopathy: Secondary | ICD-10-CM | POA: Diagnosis not present

## 2021-01-26 DIAGNOSIS — Y92009 Unspecified place in unspecified non-institutional (private) residence as the place of occurrence of the external cause: Secondary | ICD-10-CM | POA: Diagnosis not present

## 2021-01-26 DIAGNOSIS — E785 Hyperlipidemia, unspecified: Secondary | ICD-10-CM | POA: Diagnosis not present

## 2021-01-26 DIAGNOSIS — R600 Localized edema: Secondary | ICD-10-CM | POA: Diagnosis not present

## 2021-01-26 DIAGNOSIS — E876 Hypokalemia: Secondary | ICD-10-CM | POA: Diagnosis not present

## 2021-01-26 DIAGNOSIS — G629 Polyneuropathy, unspecified: Secondary | ICD-10-CM | POA: Diagnosis not present

## 2021-01-26 DIAGNOSIS — Z888 Allergy status to other drugs, medicaments and biological substances status: Secondary | ICD-10-CM | POA: Diagnosis not present

## 2021-01-26 DIAGNOSIS — K7031 Alcoholic cirrhosis of liver with ascites: Secondary | ICD-10-CM | POA: Diagnosis not present

## 2021-01-26 DIAGNOSIS — Z5320 Procedure and treatment not carried out because of patient's decision for unspecified reasons: Secondary | ICD-10-CM | POA: Diagnosis not present

## 2021-01-26 DIAGNOSIS — R5381 Other malaise: Secondary | ICD-10-CM | POA: Diagnosis not present

## 2021-01-26 DIAGNOSIS — Z8349 Family history of other endocrine, nutritional and metabolic diseases: Secondary | ICD-10-CM | POA: Diagnosis not present

## 2021-01-26 DIAGNOSIS — R0902 Hypoxemia: Secondary | ICD-10-CM | POA: Diagnosis not present

## 2021-01-26 LAB — BASIC METABOLIC PANEL
Anion gap: 5 (ref 5–15)
BUN: 21 mg/dL — ABNORMAL HIGH (ref 6–20)
CO2: 33 mmol/L — ABNORMAL HIGH (ref 22–32)
Calcium: 8.5 mg/dL — ABNORMAL LOW (ref 8.9–10.3)
Chloride: 98 mmol/L (ref 98–111)
Creatinine, Ser: 0.61 mg/dL (ref 0.44–1.00)
GFR, Estimated: >60 mL/min (ref >60)
Glucose, Bld: 129 mg/dL — ABNORMAL HIGH (ref 70–99)
Potassium: 4.4 mmol/L (ref 3.5–5.1)
Sodium: 136 mmol/L (ref 135–145)

## 2021-01-26 LAB — CBC WITH DIFFERENTIAL/PLATELET
Abs Immature Granulocytes: 0.77 10*3/uL — ABNORMAL HIGH (ref 0.00–0.07)
Basophils Absolute: 0.2 10*3/uL — ABNORMAL HIGH (ref 0.0–0.1)
Basophils Relative: 1 %
Eosinophils Absolute: 0.3 10*3/uL (ref 0.0–0.5)
Eosinophils Relative: 1 %
HCT: 28.1 % — ABNORMAL LOW (ref 36.0–46.0)
Hemoglobin: 8.7 g/dL — ABNORMAL LOW (ref 12.0–15.0)
Immature Granulocytes: 2 %
Lymphocytes Relative: 14 %
Lymphs Abs: 4.8 10*3/uL — ABNORMAL HIGH (ref 0.7–4.0)
MCH: 33.9 pg (ref 26.0–34.0)
MCHC: 31 g/dL (ref 30.0–36.0)
MCV: 109.3 fL — ABNORMAL HIGH (ref 80.0–100.0)
Monocytes Absolute: 1.1 10*3/uL — ABNORMAL HIGH (ref 0.1–1.0)
Monocytes Relative: 3 %
Neutro Abs: 27.5 10*3/uL — ABNORMAL HIGH (ref 1.7–7.7)
Neutrophils Relative %: 79 %
Platelets: 318 10*3/uL (ref 150–400)
RBC: 2.57 MIL/uL — ABNORMAL LOW (ref 3.87–5.11)
RDW: 16.1 % — ABNORMAL HIGH (ref 11.5–15.5)
Smear Review: NORMAL
WBC: 34.6 10*3/uL — ABNORMAL HIGH (ref 4.0–10.5)
nRBC: 0.1 % (ref 0.0–0.2)

## 2021-01-26 LAB — COMP PANEL: LEUKEMIA/LYMPHOMA

## 2021-01-26 LAB — GLUCOSE, CAPILLARY
Glucose-Capillary: 107 mg/dL — ABNORMAL HIGH (ref 70–99)
Glucose-Capillary: 127 mg/dL — ABNORMAL HIGH (ref 70–99)

## 2021-01-26 LAB — SARS CORONAVIRUS 2 (TAT 6-24 HRS): SARS Coronavirus 2: NEGATIVE

## 2021-01-26 MED ORDER — FOLIC ACID 1 MG PO TABS: 1.0000 mg | ORAL_TABLET | Freq: Every day | ORAL | Status: DC

## 2021-01-26 MED ORDER — THIAMINE HCL 100 MG PO TABS: 100.0000 mg | ORAL_TABLET | Freq: Every day | ORAL | Status: DC

## 2021-01-26 MED ORDER — SPIRONOLACTONE 100 MG PO TABS: 100.0000 mg | ORAL_TABLET | Freq: Every day | ORAL | Status: DC

## 2021-01-26 MED ORDER — FUROSEMIDE 40 MG PO TABS: 40.0000 mg | ORAL_TABLET | Freq: Every day | ORAL | Status: DC

## 2021-01-26 MED ORDER — LACTULOSE 10 GM/15ML PO SOLN
20.0000 g | Freq: Two times a day (BID) | ORAL | 0 refills | Status: DC | PRN
Start: 2021-01-26 — End: 2021-02-14

## 2021-01-26 MED ORDER — OLANZAPINE 5 MG PO TABS: 5.0000 mg | ORAL_TABLET | Freq: Every day | ORAL | Status: DC

## 2021-01-26 MED ORDER — RIFAXIMIN 550 MG PO TABS: 550.0000 mg | ORAL_TABLET | Freq: Two times a day (BID) | ORAL | Status: DC

## 2021-01-26 MED ORDER — INSULIN GLARGINE-YFGN 100 UNIT/ML ~~LOC~~ SOLN: 28.0000 [IU] | Freq: Two times a day (BID) | SUBCUTANEOUS | 11 refills | Status: DC

## 2021-01-26 MED ORDER — INSULIN ASPART 100 UNIT/ML IJ SOLN
12.0000 [IU] | Freq: Three times a day (TID) | INTRAMUSCULAR | 11 refills | Status: DC
Start: 2021-01-26 — End: 2021-02-15

## 2021-01-26 MED ORDER — MAGNESIUM OXIDE -MG SUPPLEMENT 400 (240 MG) MG PO TABS: 400.0000 mg | ORAL_TABLET | Freq: Two times a day (BID) | ORAL | Status: DC

## 2021-01-26 NOTE — NC FL2 (Signed)
[place] MEDICAID FL2 LEVEL OF CARE SCREENING TOOL     IDENTIFICATION  Patient Name: [name] Birthdate: 02-02-1976 Sex: female Admission Date (Current Location): 12/17/2020  [facility] and [place] Number:  Chiropodist and Address:  [facility], [address], [place], [place] [address]      Provider Number: [id number]  Attending Physician Name and Address:  Kathrynn Running, MD  Relative Name and Phone Number:  Bonita Quin (mother) [phone]    Current Level of Care: Hospital Recommended Level of Care: Skilled Nursing Facility Prior Approval Number:    Date Approved/Denied:   PASRR Number:  [id number] F Expires 04/09/2021  Discharge Plan: SNF    Current Diagnoses: Patient Active Problem List   Diagnosis Date Noted   Leukocytosis    Aspiration pneumonia of both lower lobes due to gastric secretions ([name]) 01/04/2021   Pressure injury of skin 12/24/2020   Acute hepatic encephalopathy 12/20/2020   Sepsis ([name]) 12/19/2020   Acute hyponatremia 12/18/2020   Alcohol use disorder, moderate, dependence ([name]) 12/18/2020   Hepatic steatosis 12/18/2020   Polysubstance abuse ([name]) 12/18/2020   Hyperglycemia due to type 2 diabetes mellitus ([name]) 12/18/2020   Hyperbilirubinemia 12/18/2020   Lactic acidosis 12/18/2020   Alcoholic liver disease ([name]) 12/18/2020   Bipolar 1 disorder, manic, moderate ([name]) 09/10/2018   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   Diabetes ([name]) 09/10/2018   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   Anxiety and depression 10/01/2012   Protein-calorie malnutrition, severe ([name]) 09/30/2012   Alcohol abuse 09/26/2012   Trichomonas infection 09/26/2012   Dehydration 09/26/2012   Hypokalemia 11/14/2011   Thrombocytopenia ([name]) 11/14/2011   Alcohol withdrawal ([name]) 11/13/2011   Bipolar 2 disorder ([name]) 11/13/2011   Tobacco abuse 11/13/2011   Cocaine abuse ([name]) 11/13/2011   Gout 11/13/2011     Orientation RESPIRATION BLADDER Height & Weight     Self, Time, Situation, Place  Normal, O2 (Goes between nasal cannula 1 L and room air.) Continent, External catheter Weight: 215 lb 13.3 oz (97.9 kg) Height:  5\' 7"  (170.2 cm)  BEHAVIORAL SYMPTOMS/MOOD NEUROLOGICAL BOWEL NUTRITION STATUS   (None)  (None) Continent Diet (Carb modified)  AMBULATORY STATUS COMMUNICATION OF NEEDS Skin   Extensive Assist Verbally Bruising, Other (Comment) (Blister, excoriated. MASD right left anterior pelvis (no dressing).)                       Personal Care Assistance Level of Assistance  Bathing, Feeding Bathing Assistance: Limited assistance Feeding assistance: Independent Dressing Assistance: Limited assistance   Functional Limitations Info  Sight, Hearing, Speech Sight Info: Adequate Hearing Info: Adequate Speech Info: Adequate    SPECIAL CARE FACTORS FREQUENCY  PT (By licensed PT), OT (By licensed OT)     PT Frequency: 5 x week OT Frequency: 5 x week            Contractures Contractures Info: Not present    Additional Factors Info  Code Status, Allergies, Psychotropic Code Status Info: Full code Allergies Info: Erythromycin, Sulfa Antibiotics, Tylenol (Acetaminophen), Nsaids, Gabapentin Psychotropic Info: Bipolar 2         Current Medications (01/26/2021):  This is the current hospital active medication list Current Facility-Administered Medications  Medication Dose Route Frequency Provider Last Rate Last Admin   0.9 %  sodium chloride infusion   Intravenous PRN 13/12/2020, MD 10 mL/hr at 01/19/21 0416 Infusion Verify at 01/19/21 0416   albuterol (PROVENTIL) (2.5 MG/3ML) 0.083% nebulizer solution  2.5 mg  2.5 mg Nebulization Q6H PRN Marzetta Board, MD   2.5 mg at 01/07/21 0855   budesonide (ENTOCORT EC) 24 hr capsule 9 mg  9 mg Oral Daily Marzetta Board, MD   9 mg at 01/26/21 0817   dicyclomine (BENTYL) tablet 20 mg  20 mg Oral TID PRN  Andris Baumann, MD   20 mg at 01/24/21 0145   diphenoxylate-atropine (LOMOTIL) 2.5-0.025 MG per tablet 1 tablet  1 tablet Oral QID PRN Lolita Patella B, MD   1 tablet at 01/23/21 0422   enoxaparin (LOVENOX) injection 50 mg  0.5 mg/kg Subcutaneous Q24H Tressie Ellis, RPH   50 mg at 01/25/21 1747   feeding supplement (ENSURE ENLIVE / ENSURE PLUS) liquid 237 mL  237 mL Oral TID BM Lurene Shadow, MD   237 mL at 01/26/21 0814   FLUoxetine (PROZAC) capsule 80 mg  80 mg Oral Daily Marzetta Board, MD   80 mg at 01/26/21 0817   fluticasone (FLONASE) 50 MCG/ACT nasal spray 1 spray  1 spray Each Nare Daily Marzetta Board, MD   1 spray at 01/25/21 0920   folic acid (FOLVITE) tablet 1 mg  1 mg Oral Daily Marzetta Board, MD   1 mg at 01/26/21 0816   furosemide (LASIX) tablet 40 mg  40 mg Oral Daily Kathrynn Running, MD   40 mg at 01/26/21 0816   insulin aspart (novoLOG) injection 0-20 Units  0-20 Units Subcutaneous TID WC Marzetta Board, MD   7 Units at 01/25/21 1633   insulin aspart (novoLOG) injection 0-5 Units  0-5 Units Subcutaneous QHS Marzetta Board, MD   3 Units at 01/22/21 2140   insulin aspart (novoLOG) injection 12 Units  12 Units Subcutaneous TID WC Lolita Patella B, MD   12 Units at 01/26/21 0817   insulin glargine-yfgn (SEMGLEE) injection 28 Units  28 Units Subcutaneous BID Lurene Shadow, MD   28 Units at 01/26/21 0827   lactulose (CHRONULAC) 10 GM/15ML solution 20 g  20 g Oral BID PRN Toney Reil, MD       loperamide (IMODIUM) capsule 2 mg  2 mg Oral PRN Lolita Patella B, MD   2 mg at 01/22/21 1336   magnesium oxide (MAG-OX) tablet 400 mg  400 mg Oral BID Lurene Shadow, MD   400 mg at 01/26/21 0816   methocarbamol (ROBAXIN) tablet 500 mg  500 mg Oral BID PRN Marzetta Board, MD   500 mg at 01/26/21 0047   multivitamin with minerals tablet 1 tablet  1 tablet Oral Daily Marzetta Board, MD   1 tablet at  01/26/21 0816   OLANZapine (ZYPREXA) tablet 5 mg  5 mg Oral QHS Clapacs, John T, MD   5 mg at 01/25/21 2104   ondansetron (ZOFRAN) tablet 4 mg  4 mg Oral Q6H PRN Marzetta Board, MD       Or   ondansetron [facility]) injection 4 mg  4 mg Intravenous Q6H PRN Marzetta Board, MD   4 mg at 01/24/21 0826   pantoprazole (PROTONIX) EC tablet 40 mg  40 mg Oral BID Marzetta Board, MD   40 mg at 01/26/21 0816   pregabalin (LYRICA) capsule 75 mg  75 mg Oral TID Marzetta Board, MD   75 mg at 01/26/21 0816   rifaximin (XIFAXAN) tablet 550 mg  550 mg Oral BID Marrion Coy, MD   550 mg at 01/26/21 [phone]  sodium chloride flush (NS) 0.9 % injection 10-40 mL  10-40 mL Intracatheter PRN Mick Sell, MD       spironolactone (ALDACTONE) tablet 100 mg  100 mg Oral Daily Toney Reil, MD   100 mg at 01/26/21 0964   thiamine tablet 100 mg  100 mg Oral Daily Marzetta Board, MD   100 mg at 01/26/21 3838   Vitamin D (Ergocalciferol) (DRISDOL) capsule 50,000 Units  50,000 Units Oral Q7 days Marzetta Board, MD   50,000 Units at 01/26/21 1840     Discharge Medications: Please see discharge summary for a list of discharge medications.  Relevant Imaging Results:  Relevant Lab Results:   Additional Information SS#: [id number]  Margarito Liner, LCSW

## 2021-01-26 NOTE — Progress Notes (Signed)
Occupational Therapy Treatment Patient Details Name: [name] MRN: [id number] DOB: 1975/08/14 Today's Date: 01/26/2021   History of present illness 45 y.o. female with medical history significant for Bipolar disorder, diabetes, alcohol use disorder and polysubstance abuse, FSH muscular dystrophy, with history of frequent falls, last seen by neurology in January 2020, who was brought in by EMS after she fell at home 2 days prior and was unable to get up.   OT comments  Pt seen for OT tx this date to f/u re: safety with ADLs/ADL mobility. OT engages pt in sup to sit with SUPV and pt demos F sitting balance. She requires SETUP/SUPV for seated UB bathing/drying/dressing tasks. She is agreeable to attempting to complete LB bathing in standing. She requires MOD A +2 to CTS from ~2-3" elevated bed surface and requires posterior support to achieve hip extension. She requires MAX A to complete actual LB bathing task in standing as she cannot alternate her UE support from RW. On second stand trial OT engages pt in taking some steps to improve safety with fxl mobility. Pt able to take ~4 steps FWB and ~3 back to bed (requires more support for backward step d/t decreased balance). Pt able to take steps with only MIN A and RW for safety/balance. She returned to sitting with MOD A. Able to return to supine with MOD I. She demos continued progress with OT services and makes good effort. Will continue to follow.    Recommendations for follow up therapy are one component of a multi-disciplinary discharge planning process, led by the attending physician.  Recommendations may be updated based on patient status, additional functional criteria and insurance authorization.    Follow Up Recommendations  Skilled nursing-short term rehab (<3 hours/day)    Assistance Recommended at Discharge    Equipment Recommendations  BSC/3in1    Recommendations for Other Services      Precautions / Restrictions  Precautions Precautions: Fall Precaution Comments: increasing WBCs Restrictions Weight Bearing Restrictions: No       Mobility Bed Mobility Overal bed mobility: Needs Assistance       Supine to sit: Supervision Sit to supine: Modified independent (Device/Increase time)        Transfers Overall transfer level: Needs assistance Equipment used: Rolling walker (2 wheels) Transfers: Sit to/from Stand Sit to Stand: Mod assist;+2 physical assistance           General transfer comment: Pt able to CTS with MOD A +2 (requires posterior support under hips/buttocks in order to bring hips into extension to acheive standing, bed surface elevated ~2-3"     Balance Overall balance assessment: Needs assistance Sitting-balance support: Feet supported;Bilateral upper extremity supported Sitting balance-Leahy Scale: Good Sitting balance - Comments: keeps B hands for support on bed   Standing balance support: Bilateral upper extremity supported;During functional activity;Reliant on assistive device for balance Standing balance-Leahy Scale: Poor Standing balance comment: requires MIN A to sustain static stand and cannot alternate hands from bilateral support d/t weakness.                           ADL either performed or assessed with clinical judgement   ADL Overall ADL's : Needs assistance/impaired         Upper Body Bathing: Sitting;Supervision/ safety;Set up Upper Body Bathing Details (indicate cue type and reason): G EOB static sitting Lower Body Bathing: Maximal assistance;Sit to/from stand Lower Body Bathing Details (indicate cue type and reason): MOD A +  2 to CTS from EOB with RW, MAX A for actual standing LB bathing task as pt is unable to alternate UE support from walker to address peri area.     Lower Body Dressing: Modified independent;Bed level Lower Body Dressing Details (indicate cue type and reason): figure 4 in bed to don/doff socks              Functional mobility during ADLs: Minimal assistance;Rolling walker (2 wheels) (to take ~4 steps FWD and ~3 back to bed with RW. Has support on both sides, but more for confidence. She can actually complete steps with MIN A of one person)      Extremity/Trunk Assessment              Vision       Perception     Praxis      Cognition Arousal/Alertness: Awake/alert Behavior During Therapy: WFL for tasks assessed/performed Overall Cognitive Status: Within Functional Limits for tasks assessed                                            Exercises Other Exercises Other Exercises: OT ed with pt re: safe use of RW. OT engages pt in bathing/dressing tasks.   Shoulder Instructions       General Comments      Pertinent Vitals/ Pain       Pain Assessment: No/denies pain Pain Score: 4  Pain Location: Bilat hips Pain Descriptors / Indicators: Discomfort Pain Intervention(s): Repositioned  Home Living                                          Prior Functioning/Environment              Frequency  Min 2X/week        Progress Toward Goals  OT Goals(current goals can now be found in the care plan section)  Progress towards OT goals: Progressing toward goals  Acute Rehab OT Goals OT Goal Formulation: With patient Time For Goal Achievement: 01/30/21 Potential to Achieve Goals: Good  Plan Discharge plan remains appropriate;Frequency remains appropriate    Co-evaluation                 AM-PAC OT "6 Clicks" Daily Activity     Outcome Measure   Help from another person eating meals?: None Help from another person taking care of personal grooming?: None Help from another person toileting, which includes using toliet, bedpan, or urinal?: A Lot Help from another person bathing (including washing, rinsing, drying)?: A Little Help from another person to put on and taking off regular upper body clothing?: A Little Help from  another person to put on and taking off regular lower body clothing?: A Lot 6 Click Score: 18    End of Session Equipment Utilized During Treatment: Oxygen;Gait belt;Rolling walker (2 wheels)  OT Visit Diagnosis: Unsteadiness on feet (R26.81);Muscle weakness (generalized) (M62.81);History of falling (Z91.81)   Activity Tolerance Patient tolerated treatment well   Patient Left in bed;with call bell/phone within reach;with bed alarm set;with family/visitor present   Nurse Communication          Time: 1020-1058 OT Time Calculation (min): 38 min  Charges: OT General Charges $OT Visit: 1 Visit OT Treatments $Self Care/Home Management : 23-37 mins $  Therapeutic Activity: 8-22 mins  Rejeana Brock, [place], OTR/L ascom [phone] 01/26/21, 12:43 PM

## 2021-01-26 NOTE — Care Management Important Message (Signed)
Important Message  Patient Details  Name: [name] MRN: [id number] Date of Birth: 13-Sep-1975   Medicare Important Message Given:  Yes     Olegario Messier A [name] 01/26/2021, 11:55 AM

## 2021-01-26 NOTE — Progress Notes (Signed)
Patient being discharged to facility via EMS.  Discharge instructions given to zenette at facility via phone. EMS received discharge packet.  Patient discharged with all pertinent information, prescriptions, and personal belongings. Family bedside and made aware. IV site d/ced.  No acute distress noted. Care relinquished.

## 2021-01-26 NOTE — TOC Progression Note (Addendum)
Transition of Care [facility]) - Progression Note    Patient Details  Name: MAICY FILIP MRN: [id number] Date of Birth: 10-30-1975  Transition [facility]) CM/SW Contact  Margarito Liner, LCSW Phone Number: 01/26/2021, 8:55 AM  Clinical Narrative:   [facility] has a bed available today if insurance authorization approved. Uploaded clinicals into [facility] portal. COVID test done yesterday and was negative.  10:11 am: Insurance authorization approved: [id number]. Valid 11/10-11/14. Left message for SNF admissions coordinator to notify. Will ask MD for discharge once she replies.  Expected Discharge Plan: Skilled Nursing Facility Barriers to Discharge: Continued Medical Work up  Expected Discharge Plan and Services Expected Discharge Plan: Skilled Nursing Facility     Post Acute Care Choice: Skilled Nursing Facility Living arrangements for the past 2 months: Single Family Home                                       Social Determinants of Health (SDOH) Interventions    Readmission Risk Interventions No flowsheet data found.

## 2021-01-26 NOTE — TOC Transition Note (Signed)
Transition of Care [facility]) - CM/SW Discharge Note   Patient Details  Name: [name] MRN: [id number] Date of Birth: 10-14-1975  [facility]) CM/SW Contact:  Margarito Liner, LCSW Phone Number: 01/26/2021, 11:12 AM   Clinical Narrative:   Patient has orders to discharge to [facility] in [place]. RN will call report to [phone] (Room A3, Bed 1). EMS transport arranged for 3:00 per SNF request. No further concerns. CSW signing off.  Final next level of care: Skilled Nursing Facility Barriers to Discharge: Barriers Resolved   Patient Goals and CMS Choice Patient states their goals for this hospitalization and ongoing recovery are:: to go home CMS Medicare.gov Compare Post Acute Care list provided to:: Patient Choice offered to / list presented to : Patient  Discharge Placement PASRR number recieved: 01/11/21            Patient chooses bed at: Other - please specify in the comment section below: [facility] in [place]) Patient to be transferred to facility by: EMS Name of family member notified: Cousin at bedside. Patient will notify her mother. Patient and family notified of of transfer: 01/26/21  Discharge Plan and Services     Post Acute Care Choice: Skilled Nursing Facility                               Social Determinants of Health (SDOH) Interventions     Readmission Risk Interventions No flowsheet data found.

## 2021-01-26 NOTE — Discharge Summary (Signed)
[name] EXH:[id number] DOB: Mar 21, 1975 DOA: 12/17/2020  PCP: Eunice Blase, PA-C  Admit date: 12/17/2020 Discharge date: 01/26/2021  Time spent: 45 minutes  Recommendations for Outpatient Follow-up:  F/u Hematology (Dr. Cathie Hoops) in 1 week Repeat CBC and BMP in 1 week GI f/u for cirrhosis    Discharge Diagnoses:  Principal Problem:   Alcohol use disorder, moderate, dependence ([name]) Active Problems:   Alcohol withdrawal ([name])   Bipolar 2 disorder ([name])   Hypokalemia   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[name])   Acute hyponatremia   Hepatic steatosis   Polysubstance abuse ([name])   Hyperglycemia due to type 2 diabetes mellitus ([name])   Hyperbilirubinemia   Lactic acidosis   Alcoholic liver disease ([name])   Sepsis ([name])   Acute hepatic encephalopathy   Pressure injury of skin   Aspiration pneumonia of both lower lobes due to gastric secretions ([name])   Leukocytosis   Discharge Condition: stable  Diet recommendation: low sodium carb modified  Filed Weights   01/24/21 0415 01/25/21 0325 01/26/21 0500  Weight: 98.8 kg 99.5 kg 97.9 kg    History of present illness:   [name] is a 45 y.o. female with medical history significant for Bipolar disorder, diabetes, alcohol use disorder and polysubstance abuse, FSH muscular dystrophy, with history of falls, last seen by neurology in January 2020, who was brought in by EMS after she fell at home 2 days prior and was unable to get up   Patient s denies loss of consciousness or hurting herself when she fell.  States that she hurts all over but denies back pain.  Has numbness in her toes that she states is typical for her for her peripheral neuropathy.  Continues to feel very weak in her legs and states that it is becoming increasingly difficult for her to get around in the house.  States she had been drinking vodka and beer for the first day while on the floor.    Hospital Course:   Extended hospital course of 39 days.  Primary problem aspiration pneumonia, treated with cefepime transitioned to augmentin and completed course. Does have persistent o2 requirement of 1 liter so will d/c on that. Patient has alcohol cirrhosis and was encephalopathic on arrival. Rifaximin and lactulose started and encephalopathy resolved. Patient required paracentesis x4, no SBP. Diuretics were up-titrated. Bipolar disorder and diabetes stable. Patient had persistent leukocytosis. ID consulted, leukocytosis felt not to be 2/2 active infection. Hematology consulted, thinks likely leukemoid reaction. Several labs obtained by hematology are pending at time of discharge. Will need f/u with hematology in 1 week, they will schedule. Alcohol abstinence obviously necessary.  Procedures: paracentesis   Consultations: ID, hematology  Discharge Exam: Vitals:   01/26/21 0446 01/26/21 0731  BP:  119/74  Pulse:  (!) 105  Resp:  18  Temp:  98.7 F (37.1 C)  SpO2: 94% 92%    General exam: No apparent distress.  Appears chronically ill Respiratory system: Bibasilar crackles.  Normal work of breathing.  2 L Cardiovascular system: S1-S2, RRR, no murmurs, no pedal edema Gastrointestinal system: Soft, mod distended,normal bowel sounds  Central nervous system: Alert and oriented. No focal neurological deficits. Extremities: Symmetric 5 x 5 power. Skin: No rashes, lesions or ulcers Psychiatry: Judgement and insight appear normal. Mood & affect appropriate.     Discharge Instructions   Discharge Instructions     Diet - low sodium heart healthy   Complete by: As directed    Discharge wound care:  Complete by: As directed    Silicone foam dressings to the sacral wound change every 3 days. Assess under dressings each shift for any acute changes in the wounds.   Increase activity slowly   Complete by: As directed    No wound care   Complete by: As directed       Allergies as of 01/26/2021       Reactions   Erythromycin Anaphylaxis    Sulfa Antibiotics Anaphylaxis   Tylenol [acetaminophen] Other (See Comments)   Pancreatitis and liver dysfunction, not supposed to take med   Nsaids Other (See Comments)   Causes GI problems, very bad reaction-per patient.    Gabapentin    Mood changes         Medication List     STOP taking these medications    cariprazine 3 MG capsule Commonly known as: Vraylar   clonazePAM 1 MG tablet Commonly known as: KLONOPIN   Janumet XR [phone] MG Tb24 Generic drug: SitaGLIPtin-MetFORMIN HCl   metFORMIN 500 MG tablet Commonly known as: GLUCOPHAGE   Myrbetriq 50 MG Tb24 tablet Generic drug: mirabegron ER   naproxen 250 MG tablet Commonly known as: NAPROSYN   Norgestimate-Ethinyl Estradiol Triphasic 0.18/0.215/0.25 MG-35 MCG tablet   Ozempic (0.25 or 0.5 MG/DOSE) 2 MG/1.5ML Sopn Generic drug: Semaglutide(0.25 or 0.5MG /DOS)   topiramate 200 MG tablet Commonly known as: TOPAMAX       TAKE these medications    albuterol 108 (90 Base) MCG/ACT inhaler Commonly known as: VENTOLIN HFA Inhale 2 puffs into the lungs every 6 (six) hours as needed for wheezing.   atorvastatin 40 MG tablet Commonly known as: LIPITOR Take 1 tablet (40 mg total) by mouth daily.   budesonide 3 MG 24 hr capsule Commonly known as: ENTOCORT EC Take 9 mg by mouth daily.   calcium-vitamin D 500-200 MG-UNIT tablet Commonly known as: OSCAL WITH D Take 1 tablet by mouth daily with breakfast.   dicyclomine 20 MG tablet Commonly known as: BENTYL Take 20 mg by mouth 3 (three) times daily.   diphenoxylate-atropine 2.5-0.025 MG tablet Commonly known as: LOMOTIL Take 1 tablet by mouth in the morning, at noon, in the evening, and at bedtime.   esomeprazole 40 MG capsule Commonly known as: NEXIUM Take 40 mg by mouth daily.   FLUoxetine 40 MG capsule Commonly known as: PROZAC Take 2 capsules (80 mg total) by mouth daily.   fluticasone 50 MCG/ACT nasal spray Commonly known as: FLONASE Place 1  spray into both nostrils daily.   folic acid 1 MG tablet Commonly known as: FOLVITE Take 1 tablet (1 mg total) by mouth daily. Start taking on: January 27, 2021   furosemide 40 MG tablet Commonly known as: LASIX Take 1 tablet (40 mg total) by mouth daily. Start taking on: January 27, 2021   insulin aspart 100 UNIT/ML injection Commonly known as: novoLOG Inject 12 Units into the skin 3 (three) times daily with meals.   insulin glargine-yfgn 100 UNIT/ML injection Commonly known as: SEMGLEE Inject 0.28 mLs (28 Units total) into the skin 2 (two) times daily.   lactulose 10 GM/15ML solution Commonly known as: CHRONULAC Take 30 mLs (20 g total) by mouth 2 (two) times daily as needed for mild constipation.   magnesium oxide 400 (240 Mg) MG tablet Commonly known as: MAG-OX Take 1 tablet (400 mg total) by mouth 2 (two) times daily.   methocarbamol 500 MG tablet Commonly known as: ROBAXIN Take 500 mg by mouth 2 (two) times  daily as needed.   multivitamin with minerals Tabs tablet Take 1 tablet by mouth daily.   naloxone 4 MG/0.1ML Liqd nasal spray kit Commonly known as: NARCAN Take as needed for opioid overdose   OLANZapine 5 MG tablet Commonly known as: ZYPREXA Take 1 tablet (5 mg total) by mouth at bedtime.   pantoprazole 40 MG tablet Commonly known as: PROTONIX Take 1 tablet (40 mg total) by mouth daily.   pregabalin 75 MG capsule Commonly known as: LYRICA Take 75-150 mg by mouth 3 (three) times daily.   promethazine 25 MG tablet Commonly known as: PHENERGAN Take 25 mg by mouth every 6 (six) hours as needed for nausea/vomiting.   rifaximin 550 MG Tabs tablet Commonly known as: XIFAXAN Take 1 tablet (550 mg total) by mouth 2 (two) times daily.   spironolactone 100 MG tablet Commonly known as: ALDACTONE Take 1 tablet (100 mg total) by mouth daily. Start taking on: January 27, 2021   thiamine 100 MG tablet Take 1 tablet (100 mg total) by mouth daily. Start  taking on: January 27, 2021               Discharge Care Instructions  (From admission, onward)           Start     Ordered   01/26/21 0000  Discharge wound care:       Comments: Silicone foam dressings to the sacral wound change every 3 days. Assess under dressings each shift for any acute changes in the wounds.   01/26/21 1041           Allergies  Allergen Reactions   Erythromycin Anaphylaxis   Sulfa Antibiotics Anaphylaxis   Tylenol [Acetaminophen] Other (See Comments)    Pancreatitis and liver dysfunction, not supposed to take med   Nsaids Other (See Comments)    Causes GI problems, very bad reaction-per patient.    Gabapentin     Mood changes     Contact information for follow-up providers     Earlie Server, MD Follow up in 1 week(s).   Specialty: Oncology Contact information: [place] [address] [phone]              Contact information for after-discharge care     [facility] .   Service: Skilled Nursing Contact information: [address] [place] [phone]                      The results of significant diagnostics from this hospitalization (including imaging, microbiology, ancillary and laboratory) are listed below for reference.    Significant Diagnostic Studies: DG Chest 2 View  Result Date: 01/05/2021 CLINICAL DATA:  Aspiration pneumonia. EXAM: CHEST - 2 VIEW COMPARISON:  January 02, 2021. FINDINGS: Stable cardiomediastinal silhouette. Stable elevated right hemidiaphragm is noted with associated right basilar atelectasis and effusion. Mild left basilar subsegmental atelectasis is noted. Right-sided PICC line is unchanged in position. Bony thorax is unremarkable. IMPRESSION: Stable elevated right hemidiaphragm is noted with associated right basilar atelectasis and effusion. Mild left basilar subsegmental atelectasis is noted.  Electronically Signed   By: Marijo Conception M.D.   On: 01/05/2021 17:08   DG Abd 1 View  Result Date: 01/10/2021 CLINICAL DATA:  Abdomen pain fluid retention EXAM: ABDOMEN - 1 VIEW COMPARISON:  MRI 12/23/2020 FINDINGS: Limited by habitus. Overall nonobstructed gas pattern. No radiopaque calculi. IMPRESSION: Nonobstructed gas pattern Electronically Signed  By: Jasmine Pang M.D.   On: 01/10/2021 15:47   CT CHEST WO CONTRAST  Result Date: 01/17/2021 CLINICAL DATA:  Pneumonia, effusion or abscess suspected, xray done persistent leukocytosis EXAM: CT CHEST WITHOUT CONTRAST TECHNIQUE: Multidetector CT imaging of the chest was performed following the standard protocol without IV contrast. COMPARISON:  CT chest 12/29/2020 FINDINGS: Lines and tubes: Right central venous catheter with tip terminating at the superior cavoatrial junction. Cardiovascular: Normal heart size. No significant pericardial effusion. The thoracic aorta is normal in caliber. Likely 2 vessel coronary artery calcifications. Mediastinum/Nodes: No enlarged mediastinal or axillary lymph nodes. Thyroid gland, trachea, and esophagus demonstrate no significant findings. Lungs/Pleura: Elevated right hemidiaphragm. Bilateral lower lobe peribronchovascular consolidations with air bronchograms. No pulmonary nodule. No pulmonary mass. Interval resolution of right pleural effusion. No pleural effusion. No pneumothorax. Upper Abdomen: No acute abnormality. Musculoskeletal: Subcutaneus soft tissue edema along the lateral right upper abdomen. No suspicious lytic or blastic osseous lesions. No acute displaced fracture. IMPRESSION: 1. Bilateral lower lobe peribronchovascular consolidations with air bronchograms. Findings could represent a combination of infection/inflammation versus atelectasis. Limited evaluation on this noncontrast study. 2. Interval resolution of a right pleural effusion. Elevated right hemidiaphragm. 3. Subcutaneus soft tissue edema along  the lateral right upper abdomen. Electronically Signed   By: Tish Frederickson M.D.   On: 01/17/2021 15:59   CT Angio Chest Pulmonary Embolism (PE) W or WO Contrast  Result Date: 12/29/2020 CLINICAL DATA:  Inpatient.  Dyspnea.  Abnormal chest radiograph. EXAM: CT ANGIOGRAPHY CHEST WITH CONTRAST TECHNIQUE: Multidetector CT imaging of the chest was performed using the standard protocol during bolus administration of intravenous contrast. Multiplanar CT image reconstructions and MIPs were obtained to evaluate the vascular anatomy. CONTRAST:  68mL OMNIPAQUE IOHEXOL 350 MG/ML SOLN COMPARISON:  Chest radiograph from one day prior. FINDINGS: Cardiovascular: The study is high quality for the evaluation of pulmonary embolism. There are no filling defects in the central, lobar, segmental or subsegmental pulmonary artery branches to suggest acute pulmonary embolism. Normal course and caliber of the thoracic aorta. Top-normal caliber main pulmonary artery (3.1 cm diameter). Top-normal heart size. No significant pericardial fluid/thickening. Mediastinum/Nodes: No discrete thyroid nodules. Unremarkable esophagus. No pathologically enlarged axillary, mediastinal or hilar lymph nodes. Lungs/Pleura: No pneumothorax. Small dependent right pleural effusion. No left pleural effusion. Moderate elevation of the right hemidiaphragm. Complete right lower lobe and near complete right middle lobe atelectasis. Moderate dependent left lower lobe and lingular atelectasis. No lung masses or significant pulmonary nodules in the aerated portions of the lungs. Upper abdomen: Small volume perihepatic ascites. Diffuse hepatic steatosis. Musculoskeletal: No aggressive appearing focal osseous lesions. Minimal thoracic spondylosis. Review of the MIP images confirms the above findings. IMPRESSION: 1. No pulmonary embolism. 2. Moderate elevation of the right hemidiaphragm. Complete right lower lobe and near complete right middle lobe atelectasis.  Moderate dependent left lung base atelectasis. 3. Small dependent right pleural effusion. 4. Small volume perihepatic ascites. 5. Diffuse hepatic steatosis. Electronically Signed   By: Delbert Phenix M.D.   On: 12/29/2020 18:28   [place] CHEST (PLEURAL EFFUSION)  Result Date: 12/29/2020 CLINICAL DATA:  Evaluate right pleural effusion for thoracentesis. EXAM: CHEST ULTRASOUND COMPARISON:  Chest radiograph 12/28/2020 FINDINGS: Minimal fluid in the right pleural space. IMPRESSION: 1. Minimal fluid in the right pleural space. Thoracentesis not performed due to the small amount of pleural fluid. 2. Opacities in the right lower chest on recent chest radiograph probably represent a combination of elevation the right hemidiaphragm and atelectasis. Electronically Signed  By: Markus Daft M.D.   On: 12/29/2020 12:25   US Abdomen Limited  Result Date: 01/21/2021 CLINICAL DATA:  Inpatient.  Evaluate for ascites. EXAM: LIMITED ABDOMEN ULTRASOUND FOR ASCITES TECHNIQUE: Limited ultrasound survey for ascites was performed in all four abdominal quadrants. COMPARISON:  12/23/2020 MRI abdomen. FINDINGS: Trace right greater than left lower quadrant ascites. No ascites in the right upper or left upper quadrants. IMPRESSION: Trace ascites, not sufficient for paracentesis. Electronically Signed   By: Ilona Sorrel M.D.   On: 01/21/2021 12:04   US Paracentesis  Result Date: 01/17/2021 INDICATION: Cirrhosis with recurrent ascites. Request received for diagnostic and therapeutic paracentesis. EXAM: ULTRASOUND GUIDED PARACENTESIS MEDICATIONS: Local 1% lidocaine only. COMPLICATIONS: None immediate. PROCEDURE: Informed written consent was obtained from the patient after a discussion of the risks, benefits and alternatives to treatment. A timeout was performed prior to the initiation of the procedure. Initial ultrasound scanning demonstrates a moderate amount of ascites within the right lower abdominal quadrant. The right lower abdomen was  prepped and draped in the usual sterile fashion. 1% lidocaine was used for local anesthesia. Following this, a 19 gauge, 10-cm, Yueh catheter was introduced. An ultrasound image was saved for documentation purposes. The paracentesis was performed. The catheter was removed and a dressing was applied. The patient tolerated the procedure well without immediate post procedural complication. FINDINGS: A total of approximately 1800 mL of yellow-colored fluid was removed. Samples were sent to the laboratory as requested by the clinical team. IMPRESSION: Successful ultrasound-guided paracentesis yielding 1.8 liters of peritoneal fluid. Read By: Tsosie Billing PA-C Electronically Signed   By: Lucrezia Europe M.D.   On: 01/17/2021 12:39   US Paracentesis  Result Date: 01/12/2021 INDICATION: Ascites EXAM: ULTRASOUND GUIDED  PARACENTESIS MEDICATIONS: None. COMPLICATIONS: None immediate. PROCEDURE: Informed written consent was obtained from the patient after a discussion of the risks, benefits and alternatives to treatment. A timeout was performed prior to the initiation of the procedure. Initial ultrasound scanning demonstrates a large amount of ascites within the right lower abdominal quadrant. The right lower abdomen was prepped and draped in the usual sterile fashion. 1% lidocaine was used for local anesthesia. Following this, a 19 gauge Yueh catheter was introduced. An ultrasound image was saved for documentation purposes. The paracentesis was performed. The catheter was removed and a dressing was applied. The patient tolerated the procedure well without immediate post procedural complication. FINDINGS: A total of approximately 2 L of clear straw-colored fluid was removed. IMPRESSION: Successful ultrasound-guided paracentesis yielding 2 liters of clear straw-colored peritoneal fluid. Electronically Signed   By: Albin Felling M.D.   On: 01/12/2021 13:56   US Paracentesis  Result Date: 01/06/2021 INDICATION: Ascites due  to alcoholic hepatitis. EXAM: ULTRASOUND GUIDED  PARACENTESIS MEDICATIONS: None. COMPLICATIONS: None immediate. PROCEDURE: Informed written consent was obtained from the patient after a discussion of the risks, benefits and alternatives to treatment. A timeout was performed prior to the initiation of the procedure. Initial ultrasound scanning demonstrates a moderate amount of ascites within the right lower abdominal quadrant. The right lower abdomen was prepped and draped in the usual sterile fashion. 1% lidocaine was used for local anesthesia. Following this, a 19 gauge, 10-cm, Yueh catheter was introduced. An ultrasound image was saved for documentation purposes. The paracentesis was performed. The catheter was removed and a dressing was applied. The patient tolerated the procedure well without immediate post procedural complication. FINDINGS: A total of approximately 750 mL of yellow fluid was removed. Samples were sent to  the laboratory as requested by the clinical team. IMPRESSION: Successful ultrasound-guided paracentesis yielding 750 mL of peritoneal fluid. Electronically Signed   By: Markus Daft M.D.   On: 01/06/2021 13:15   US Paracentesis  Result Date: 12/30/2020 INDICATION: Symptomatic ascites EXAM: ULTRASOUND-GUIDED DIAGNOSTIC PARACENTESIS COMPARISON:  CTA PE, 12/29/2020. MR abdomen, 12/23/2020. Abdominal ultrasound, 12/18/2020., MEDICATIONS: None. COMPLICATIONS: None immediate. TECHNIQUE: Informed written consent was obtained from the patient after a discussion of the risks, benefits and alternatives to treatment. A timeout was performed prior to the initiation of the procedure. Initial ultrasound scanning demonstrates a moderate amount of ascites within the right upper abdominal quadrant. The right upper abdomen was prepped and draped in the usual sterile fashion. 1% lidocaine with epinephrine was used for local anesthesia. An ultrasound image was saved for documentation purposed. An 8 Fr  Safe-T-Centesis catheter was introduced. The paracentesis was performed. The catheter was removed and a dressing was applied. The patient tolerated the procedure well without immediate post procedural complication. FINDINGS: A total of approximately 2.2 liters of bilious ascitic fluid was removed. Samples were sent to the laboratory as requested by the clinical team. IMPRESSION: Successful ultrasound-guided paracentesis yielding 2.2 liters of bilious ascitic fluid. Michaelle Birks, MD [facility] Radiology Electronically Signed   By: Michaelle Birks M.D.   On: 12/30/2020 10:08   IR Fluoro Guide CV Line Right  Result Date: 12/30/2020 INDICATION: Poor IV access. EXAM: ULTRASOUND AND FLUOROSCOPIC GUIDED PLACEMENT OF TUNNELED CENTRAL VENOUS CATHETER MEDICATIONS: None. ANESTHESIA/SEDATION: Local anesthetic was administered. FLUOROSCOPY TIME:  36 seconds (6.5 mGy) COMPLICATIONS: None immediate. PROCEDURE: Informed written consent was obtained from the patient after a discussion of the risks, benefits, and alternatives to treatment. Questions regarding the procedure were encouraged and answered. The right neck and chest were prepped with chlorhexidine in a sterile fashion, and a sterile drape was applied covering the operative field. Maximum barrier sterile technique with sterile gowns and gloves were used for the procedure. A timeout was performed prior to the initiation of the procedure. After the overlying soft tissues were anesthetized, a small venotomy incision was created and a micropuncture kit was utilized to access the internal jugular vein. Real-time ultrasound guidance was utilized for vascular access including the acquisition of a permanent ultrasound image documenting patency of the accessed vessel. The microwire was utilized to measure appropriate catheter length. The micropuncture sheath was exchanged for a peel-away sheath over a guidewire. A 5 [place] dual  lumen tunneled central venous catheter measuring 25 cm was tunneled in a retrograde fashion from the anterior chest wall to the venotomy incision. The catheter was then placed through the peel-away sheath with tip ultimately positioned at the RIGHT atrium. Final catheter positioning was confirmed and documented with a spot radiographic image. The catheter aspirates and flushes normally. The catheter was flushed with appropriate volume heparin dwells. The catheter exit site was secured with a 2-0 nylon retention suture. The venotomy incision was closed with Dermabond. Dressings were applied. The patient tolerated the procedure well without immediate post procedural complication. FINDINGS: After catheter placement, the tip lies within the RIGHT atrium the catheter aspirates and flushes normally and is ready for immediate use. IMPRESSION: Successful placement of 25 cm dual lumen tunneled open "Power Line" central venous catheter via the right internal jugular vein with tip terminating in the RIGHT atrium. The catheter is ready for immediate use. Michaelle Birks, MD [facility] Radiology Electronically Signed   By: Michaelle Birks  M.D.   On: 12/30/2020 18:05   IR Removal Tun Cv Cath W/O FL  Result Date: 01/19/2021 CLINICAL DATA:  Status post tunneled central venous catheter placement via right internal jugular vein on 12/30/2020. Due to leukocytosis, request has been made to remove the catheter. EXAM: REMOVAL OF TUNNELED CENTRAL VENOUS CATHETER PROCEDURE: The right chest tunneled central venous catheter site was prepped with chlorhexidine. A sterile gown and gloves were worn during the procedure. A retention suture was cut. The subcutaneous cuff of the dialysis catheter was freed by manual traction. The catheter was then successfully removed in its entirety. A sterile dressing was applied over the catheter exit site. IMPRESSION: Removal of tunneled central venous catheter.  The procedure was uncomplicated. Electronically Signed   By: Aletta Edouard M.D.   On: 01/19/2021 14:28   IR US Guide Vasc Access Right  Result Date: 12/30/2020 INDICATION: Poor IV access. EXAM: ULTRASOUND AND FLUOROSCOPIC GUIDED PLACEMENT OF TUNNELED CENTRAL VENOUS CATHETER MEDICATIONS: None. ANESTHESIA/SEDATION: Local anesthetic was administered. FLUOROSCOPY TIME:  36 seconds (6.5 mGy) COMPLICATIONS: None immediate. PROCEDURE: Informed written consent was obtained from the patient after a discussion of the risks, benefits, and alternatives to treatment. Questions regarding the procedure were encouraged and answered. The right neck and chest were prepped with chlorhexidine in a sterile fashion, and a sterile drape was applied covering the operative field. Maximum barrier sterile technique with sterile gowns and gloves were used for the procedure. A timeout was performed prior to the initiation of the procedure. After the overlying soft tissues were anesthetized, a small venotomy incision was created and a micropuncture kit was utilized to access the internal jugular vein. Real-time ultrasound guidance was utilized for vascular access including the acquisition of a permanent ultrasound image documenting patency of the accessed vessel. The microwire was utilized to measure appropriate catheter length. The micropuncture sheath was exchanged for a peel-away sheath over a guidewire. A 5 [place] dual lumen tunneled central venous catheter measuring 25 cm was tunneled in a retrograde fashion from the anterior chest wall to the venotomy incision. The catheter was then placed through the peel-away sheath with tip ultimately positioned at the RIGHT atrium. Final catheter positioning was confirmed and documented with a spot radiographic image. The catheter aspirates and flushes normally. The catheter was flushed with appropriate volume heparin dwells. The catheter exit site was secured with a 2-0 nylon retention suture.  The venotomy incision was closed with Dermabond. Dressings were applied. The patient tolerated the procedure well without immediate post procedural complication. FINDINGS: After catheter placement, the tip lies within the RIGHT atrium the catheter aspirates and flushes normally and is ready for immediate use. IMPRESSION: Successful placement of 25 cm dual lumen tunneled open "Power Line" central venous catheter via the right internal jugular vein with tip terminating in the RIGHT atrium. The catheter is ready for immediate use. Michaelle Birks, MD [facility] Electronically Signed   By: Michaelle Birks M.D.   On: 12/30/2020 18:05   DG Chest Port 1 View  Result Date: 01/21/2021 CLINICAL DATA:  Hypoxia EXAM: PORTABLE CHEST 1 VIEW COMPARISON:  01/17/2021 chest radiograph. FINDINGS: Stable cardiomediastinal silhouette with top-normal heart size. No pneumothorax. Stable mild blunting of the right costophrenic angle. No left pleural effusion. Mild-to-moderate bibasilar atelectasis, similar. Stable elevation of the right hemidiaphragm. IMPRESSION: 1. Stable mild-to-moderate bibasilar atelectasis. 2. Stable mild blunting of the right costophrenic angle, cannot exclude small right pleural effusion. Electronically Signed   By: Ilona Sorrel  M.D.   On: 01/21/2021 12:06   DG Chest Port 1 View  Result Date: 01/17/2021 CLINICAL DATA:  Leukocytosis EXAM: PORTABLE CHEST 1 VIEW COMPARISON:  Previous studies including the examination of 01/05/2021 FINDINGS: Transverse diameter of heart is increased. There are linear densities in left mid and left lower lung fields with interval worsening. There is marked elevation of right hemidiaphragm. There is homogeneous opacification in the right lower lung fields with no significant interval change. Left lateral CP angle is clear. There is no pneumothorax. Tip of central venous catheter is seen in superior vena cava. IMPRESSION:  Cardiomegaly. Opacification of right mid and lower lung fields has not changed significantly. This may be due to elevation of right hemidiaphragm along with atelectasis and pleural effusion. There is interval increase in linear densities in left mid and left lower lung fields suggesting worsening of subsegmental atelectasis/pneumonitis. Electronically Signed   By: Elmer Picker M.D.   On: 01/17/2021 11:02   DG Chest Port 1 View  Result Date: 01/02/2021 CLINICAL DATA:  Aspiration pneumonia EXAM: PORTABLE CHEST 1 VIEW COMPARISON:  12/28/2020 FINDINGS: Stable complete obscuration of the right heart border due to a combination of elevation of the right hemidiaphragm, atelectasis, possibly underlying pleural effusion as shown on the CT scan from 4 days ago. Mildly improved aeration at the left lung base although some hazy density in the left hemithorax persists and there is some linear bandlike opacities in the left perihilar region. Some of the relative density of the left hemithorax compared to the right is also due to distribution of breast and chest wall tissues during imaging. Right internal jugular central venous catheter tip: SVC. No pneumothorax identified. IMPRESSION: 1. Mildly improved aeration at the left lung base compared to 01/28/2021. Otherwise stable appearance with elevated right hemidiaphragm, atelectasis, and likely underlying right pleural effusion obscuring the right heart border. 2. Right IJ central line tip: SVC. Electronically Signed   By: Van Clines M.D.   On: 01/02/2021 10:09   DG Chest Port 1 View  Result Date: 12/28/2020 CLINICAL DATA:  Dyspnea EXAM: PORTABLE CHEST 1 VIEW COMPARISON:  12/17/2020 FINDINGS: Mild progression of moderately large right pleural effusion. There is associated collapse or consolidation in the right lung base. Decreased lung volume compared to the prior study. Increase in left lower lobe atelectasis. No edema. IMPRESSION: Hypoventilation  Progressive moderately large right pleural effusion and right lower lobe atelectasis. Progressive mild left lower lobe atelectasis. Electronically Signed   By: Franchot Gallo M.D.   On: 12/28/2020 18:07   ECHOCARDIOGRAM COMPLETE  Result Date: 12/29/2020    ECHOCARDIOGRAM REPORT   Patient Name:   CACIE GASKINS Date of Exam: 12/29/2020 Medical Rec #:  [id number]     Height:       67.0 in Accession #:    [id number]    Weight:       256.6 lb Date of Birth:  September 27, 1975     BSA:          2.248 m Patient Age:    59 years      BP:           113/74 mmHg Patient Gender: F             HR:           108 bpm. Exam Location:  [facility] Procedure: 2D Echo, Cardiac Doppler and Color Doppler Indications:     Dyspnea R06.00  History:         Patient  has no prior history of Echocardiogram examinations.                  Signs/Symptoms:Anxiety; Risk Factors:Diabetes.  Sonographer:     Sherrie Sport Referring Phys:  [id number] Val Riles Diagnosing Phys: Ida Rogue MD  Sonographer Comments: No apical window and no subcostal window. Pt cant lie on back nor left side. IMPRESSIONS  1. Left ventricular ejection fraction, by estimation, is 60 to 65%. The left ventricle has normal function. The left ventricle has no regional wall motion abnormalities. Left ventricular diastolic parameters are indeterminate.  2. Right ventricular systolic function is normal. The right ventricular size is normal. There is severely elevated pulmonary artery systolic pressure. The estimated right ventricular systolic pressure is 45.4 mmHg.  3. The mitral valve is normal in structure. No evidence of mitral valve regurgitation. No evidence of mitral stenosis. FINDINGS  Left Ventricle: Left ventricular ejection fraction, by estimation, is 60 to 65%. The left ventricle has normal function. The left ventricle has no regional wall motion abnormalities. The left ventricular internal cavity size was normal in size. There is  no left ventricular hypertrophy. Left  ventricular diastolic parameters are indeterminate. Right Ventricle: The right ventricular size is normal. No increase in right ventricular wall thickness. Right ventricular systolic function is normal. There is severely elevated pulmonary artery systolic pressure. The tricuspid regurgitant velocity is 3.79 m/s, and with an assumed right atrial pressure of 5 mmHg, the estimated right ventricular systolic pressure is 09.8 mmHg. Left Atrium: Left atrial size was normal in size. Right Atrium: Right atrial size was normal in size. Pericardium: There is no evidence of pericardial effusion. Mitral Valve: The mitral valve is normal in structure. No evidence of mitral valve regurgitation. No evidence of mitral valve stenosis. Tricuspid Valve: The tricuspid valve is normal in structure. Tricuspid valve regurgitation is mild . No evidence of tricuspid stenosis. Aortic Valve: The aortic valve was not well visualized. Aortic valve regurgitation is not visualized. No aortic stenosis is present. Pulmonic Valve: The pulmonic valve was normal in structure. Pulmonic valve regurgitation is not visualized. No evidence of pulmonic stenosis. Aorta: The aortic root is normal in size and structure. Venous: The pulmonary veins were not well visualized. The inferior vena cava was not well visualized. The inferior vena cava is normal in size with greater than 50% respiratory variability, suggesting right atrial pressure of 3 mmHg. IAS/Shunts: No atrial level shunt detected by color flow Doppler.  LEFT VENTRICLE PLAX 2D LVIDd:         4.90 cm LVIDs:         3.10 cm LV PW:         1.20 cm LV IVS:        0.90 cm LVOT diam:     2.00 cm LVOT Area:     3.14 cm  LEFT ATRIUM         Index LA diam:    3.60 cm 1.60 cm/m                        PULMONIC VALVE AORTA                 PV Vmax:        0.91 m/s Ao Root diam: 2.90 cm PV Peak grad:   3.3 mmHg                       RVOT Peak grad: 3 mmHg  TRICUSPID VALVE TR Peak grad:   57.5 mmHg TR Vmax:         379.00 cm/s  SHUNTS Systemic Diam: 2.00 cm Ida Rogue MD Electronically signed by Ida Rogue MD Signature Date/Time: 12/29/2020/1:27:00 PM    Final    [place] EKG SITE RITE  Result Date: 12/30/2020 If Site Rite image not attached, placement could not be confirmed due to current cardiac rhythm.   Microbiology: Recent Results (from the past 240 hour(s))  Body fluid culture w Gram Stain     Status: None   Collection Time: 01/17/21 11:15 AM   Specimen: PATH Cytology Peritoneal fluid  Result Value Ref Range Status   Specimen Description   Final    PERITONEAL Performed at [facility], [address], [place], [place] [address]    Special Requests   Final    NONE Performed at [facility], [address] [place], [place] [address]    Gram Stain NO WBC SEEN NO ORGANISMS SEEN   Final   Culture   Final    NO GROWTH 3 DAYS Performed at [facility], [place] [address], [place], [place] [address]    Report Status 01/21/2021 FINAL  Final  MRSA Next Gen by PCR, Nasal     Status: None   Collection Time: 01/17/21  4:07 PM   Specimen: Nasal Mucosa; Nasal Swab  Result Value Ref Range Status   MRSA by PCR Next Gen NOT DETECTED NOT DETECTED Final    Comment: (NOTE) The GeneXpert MRSA Assay (FDA approved for NASAL specimens only), is one component of a comprehensive MRSA colonization surveillance program. It is not intended to diagnose MRSA infection nor to guide or monitor treatment for MRSA infections. Test performance is not FDA approved in patients less than 8 years old. Performed at [facility], [place], [place] [address]   SARS CORONAVIRUS 2 (TAT 6-24 HRS) Nasopharyngeal Nasopharyngeal Swab     Status: None   Collection Time: 01/25/21  3:39 PM   Specimen: Nasopharyngeal Swab  Result Value Ref Range Status   SARS Coronavirus 2 NEGATIVE NEGATIVE Final    Comment: (NOTE) SARS-CoV-2 target nucleic acids are NOT DETECTED.  The  SARS-CoV-2 RNA is generally detectable in upper and lower respiratory specimens during the acute phase of infection. Negative results do not preclude SARS-CoV-2 infection, do not rule out co-infections with other pathogens, and should not be used as the sole basis for treatment or other patient management decisions. Negative results must be combined with clinical observations, patient history, and epidemiological information. The expected result is Negative.  Fact Sheet for Patients: SugarRoll.be  Fact Sheet for Healthcare Providers: https://www.woods-mathews.com/  This test is not yet approved or cleared by the [place] FDA and  has been authorized for detection and/or diagnosis of SARS-CoV-2 by FDA under an Emergency Use Authorization (EUA). This EUA will remain  in effect (meaning this test can be used) for the duration of the COVID-19 declaration under Se ction 564(b)(1) of the Act, 21 U.S.C. section 360bbb-3(b)(1), unless the authorization is terminated or revoked sooner.  Performed at [facility], [place] [address], [place], [place] [address]      Labs: Basic Metabolic Panel: Recent Labs  Lab 01/20/21 0841 01/23/21 0427 01/26/21 0650  NA 137 135 136  K 3.3* 4.1 4.4  CL 94* 96* 98  CO2 32 27 33*  GLUCOSE 131* 167* 129*  BUN 24* 22* 21*  CREATININE 0.70 0.72  0.61  CALCIUM 8.5* 8.4* 8.5*   Liver Function Tests: Recent Labs  Lab 01/23/21 0427  AST 87*  ALT 25  ALKPHOS 371*  BILITOT 4.3*  PROT 7.1  ALBUMIN 1.6*   No results for input(s): LIPASE, AMYLASE in the last 168 hours. No results for input(s): AMMONIA in the last 168 hours. CBC: Recent Labs  Lab 01/22/21 0402 01/23/21 0745 01/24/21 0501 01/25/21 0617 01/26/21 0650  WBC 34.2* 31.5* 31.8* 30.8* 34.6*  NEUTROABS 28.3* 25.3* 25.8* 25.3* 27.5*  HGB 8.4* 8.7* 8.7* 8.6* 8.7*  HCT 27.5* 28.3* 28.8* 27.9* 28.1*  MCV 110.0* 110.5* 109.1* 110.7* 109.3*   PLT 258 279 292 300 318   Cardiac Enzymes: No results for input(s): CKTOTAL, CKMB, CKMBINDEX, TROPONINI in the last 168 hours. BNP: BNP (last 3 results) No results for input(s): BNP in the last 8760 hours.  ProBNP (last 3 results) No results for input(s): PROBNP in the last 8760 hours.  CBG: Recent Labs  Lab 01/25/21 0740 01/25/21 1129 01/25/21 1625 01/25/21 2052 01/26/21 0733  GLUCAP 138* 226* 231* 178* 107*       Signed:  Desma Maxim MD.  [facility] 01/26/2021, 10:46 AM

## 2021-01-30 DIAGNOSIS — Z79899 Other long term (current) drug therapy: Secondary | ICD-10-CM | POA: Diagnosis not present

## 2021-01-30 DIAGNOSIS — Z794 Long term (current) use of insulin: Secondary | ICD-10-CM | POA: Diagnosis not present

## 2021-01-30 DIAGNOSIS — E1165 Type 2 diabetes mellitus with hyperglycemia: Secondary | ICD-10-CM | POA: Diagnosis not present

## 2021-01-30 DIAGNOSIS — K760 Fatty (change of) liver, not elsewhere classified: Secondary | ICD-10-CM | POA: Diagnosis not present

## 2021-01-30 LAB — BCR-ABL1 FISH
Cells Analyzed: 200
Cells Counted: 200

## 2021-02-01 ENCOUNTER — Encounter: Payer: Self-pay | Admitting: Oncology

## 2021-02-01 ENCOUNTER — Inpatient Hospital Stay: Payer: Medicare Other | Attending: Oncology | Admitting: Oncology

## 2021-02-01 ENCOUNTER — Inpatient Hospital Stay: Payer: Medicare Other

## 2021-02-01 ENCOUNTER — Other Ambulatory Visit: Payer: Self-pay

## 2021-02-01 VITALS — BP 111/72 | HR 94 | Temp 97.6°F

## 2021-02-01 DIAGNOSIS — Z801 Family history of malignant neoplasm of trachea, bronchus and lung: Secondary | ICD-10-CM | POA: Diagnosis not present

## 2021-02-01 DIAGNOSIS — Z8249 Family history of ischemic heart disease and other diseases of the circulatory system: Secondary | ICD-10-CM | POA: Insufficient documentation

## 2021-02-01 DIAGNOSIS — E119 Type 2 diabetes mellitus without complications: Secondary | ICD-10-CM | POA: Insufficient documentation

## 2021-02-01 DIAGNOSIS — Z8349 Family history of other endocrine, nutritional and metabolic diseases: Secondary | ICD-10-CM | POA: Diagnosis not present

## 2021-02-01 DIAGNOSIS — F17210 Nicotine dependence, cigarettes, uncomplicated: Secondary | ICD-10-CM | POA: Diagnosis not present

## 2021-02-01 DIAGNOSIS — D539 Nutritional anemia, unspecified: Secondary | ICD-10-CM | POA: Diagnosis not present

## 2021-02-01 DIAGNOSIS — K7031 Alcoholic cirrhosis of liver with ascites: Secondary | ICD-10-CM | POA: Diagnosis not present

## 2021-02-01 DIAGNOSIS — Z888 Allergy status to other drugs, medicaments and biological substances status: Secondary | ICD-10-CM | POA: Insufficient documentation

## 2021-02-01 DIAGNOSIS — Z882 Allergy status to sulfonamides status: Secondary | ICD-10-CM | POA: Diagnosis not present

## 2021-02-01 DIAGNOSIS — Z833 Family history of diabetes mellitus: Secondary | ICD-10-CM | POA: Insufficient documentation

## 2021-02-01 DIAGNOSIS — K7011 Alcoholic hepatitis with ascites: Secondary | ICD-10-CM | POA: Insufficient documentation

## 2021-02-01 DIAGNOSIS — D72829 Elevated white blood cell count, unspecified: Secondary | ICD-10-CM | POA: Diagnosis not present

## 2021-02-01 DIAGNOSIS — R0902 Hypoxemia: Secondary | ICD-10-CM | POA: Diagnosis not present

## 2021-02-01 DIAGNOSIS — R5383 Other fatigue: Secondary | ICD-10-CM | POA: Insufficient documentation

## 2021-02-01 DIAGNOSIS — K760 Fatty (change of) liver, not elsewhere classified: Secondary | ICD-10-CM

## 2021-02-01 DIAGNOSIS — R600 Localized edema: Secondary | ICD-10-CM | POA: Diagnosis not present

## 2021-02-01 DIAGNOSIS — Z823 Family history of stroke: Secondary | ICD-10-CM | POA: Insufficient documentation

## 2021-02-01 DIAGNOSIS — Z886 Allergy status to analgesic agent status: Secondary | ICD-10-CM | POA: Insufficient documentation

## 2021-02-01 DIAGNOSIS — Z881 Allergy status to other antibiotic agents status: Secondary | ICD-10-CM | POA: Diagnosis not present

## 2021-02-01 DIAGNOSIS — Z79899 Other long term (current) drug therapy: Secondary | ICD-10-CM | POA: Insufficient documentation

## 2021-02-01 DIAGNOSIS — J90 Pleural effusion, not elsewhere classified: Secondary | ICD-10-CM | POA: Insufficient documentation

## 2021-02-01 DIAGNOSIS — Z7952 Long term (current) use of systemic steroids: Secondary | ICD-10-CM | POA: Diagnosis not present

## 2021-02-01 LAB — RETIC PANEL
Immature Retic Fract: 34.5 % — ABNORMAL HIGH (ref 2.3–15.9)
RBC.: 2.75 MIL/uL — ABNORMAL LOW (ref 3.87–5.11)
Retic Count, Absolute: 187 10*3/uL — ABNORMAL HIGH (ref 19.0–186.0)
Retic Ct Pct: 6.8 % — ABNORMAL HIGH (ref 0.4–3.1)
Reticulocyte Hemoglobin: 32.3 pg (ref >27.9)

## 2021-02-01 LAB — COMPREHENSIVE METABOLIC PANEL
ALT: 20 U/L (ref 0–44)
AST: 93 U/L — ABNORMAL HIGH (ref 15–41)
Albumin: 2.2 g/dL — ABNORMAL LOW (ref 3.5–5.0)
Alkaline Phosphatase: 324 U/L — ABNORMAL HIGH (ref 38–126)
Anion gap: 12 (ref 5–15)
BUN: 16 mg/dL (ref 6–20)
CO2: 25 mmol/L (ref 22–32)
Calcium: 7.9 mg/dL — ABNORMAL LOW (ref 8.9–10.3)
Chloride: 98 mmol/L (ref 98–111)
Creatinine, Ser: 0.7 mg/dL (ref 0.44–1.00)
GFR, Estimated: >60 mL/min (ref >60)
Glucose, Bld: 194 mg/dL — ABNORMAL HIGH (ref 70–99)
Potassium: 3.4 mmol/L — ABNORMAL LOW (ref 3.5–5.1)
Sodium: 135 mmol/L (ref 135–145)
Total Bilirubin: 2.9 mg/dL — ABNORMAL HIGH (ref 0.3–1.2)
Total Protein: 7.8 g/dL (ref 6.5–8.1)

## 2021-02-01 LAB — CBC WITH DIFFERENTIAL/PLATELET
Abs Immature Granulocytes: 0.16 10*3/uL — ABNORMAL HIGH (ref 0.00–0.07)
Basophils Absolute: 0.1 10*3/uL (ref 0.0–0.1)
Basophils Relative: 0 %
Eosinophils Absolute: 0.2 10*3/uL (ref 0.0–0.5)
Eosinophils Relative: 1 %
HCT: 30.5 % — ABNORMAL LOW (ref 36.0–46.0)
Hemoglobin: 9.1 g/dL — ABNORMAL LOW (ref 12.0–15.0)
Immature Granulocytes: 1 %
Lymphocytes Relative: 11 %
Lymphs Abs: 2.8 10*3/uL (ref 0.7–4.0)
MCH: 32.9 pg (ref 26.0–34.0)
MCHC: 29.8 g/dL — ABNORMAL LOW (ref 30.0–36.0)
MCV: 110.1 fL — ABNORMAL HIGH (ref 80.0–100.0)
Monocytes Absolute: 0.8 10*3/uL (ref 0.1–1.0)
Monocytes Relative: 3 %
Neutro Abs: 22.1 10*3/uL — ABNORMAL HIGH (ref 1.7–7.7)
Neutrophils Relative %: 84 %
Platelets: 385 10*3/uL (ref 150–400)
RBC: 2.77 MIL/uL — ABNORMAL LOW (ref 3.87–5.11)
RDW: 15.7 % — ABNORMAL HIGH (ref 11.5–15.5)
WBC: 26.3 10*3/uL — ABNORMAL HIGH (ref 4.0–10.5)
nRBC: 0 % (ref 0.0–0.2)

## 2021-02-01 NOTE — Progress Notes (Signed)
Hematology/Oncology Follow Up Note  [phone] [phone]  Patient Care Team: Otila Back as PCP - General (Internal Medicine)   Name of the patient: [name]  [id number]  05-Feb-1976   REASON FOR VISIT Post hospitalization follow up  INTERVAL HISTORY Patient was seen by me during recent for leukocytosis.  She had some blood work done which were still pending at the time of discharge.  Patient presents to follow-up Patient had a fall in the bathroom today.  Per RN who responded to the bathroom along, patient slipped off the wheelchair when she attempted to transition from wheelchair to toilet.  She denies any injuries.  Patient reports that she is currently in rehab, participating physical therapy.  She feels much better. She has not drank any alcohol.  She is interested in establish care with local gastroenterologist. Denies any fever and chills.  Review of Systems  Constitutional:  Positive for fatigue. Negative for appetite change, chills and fever.  HENT:   Negative for hearing loss and voice change.   Eyes:  Negative for eye problems.  Respiratory:  Negative for chest tightness and cough.   Cardiovascular:  Negative for chest pain.  Gastrointestinal:  Negative for abdominal distention, abdominal pain and blood in stool.  Endocrine: Negative for hot flashes.  Genitourinary:  Negative for difficulty urinating and frequency.   Musculoskeletal:  Negative for arthralgias.  Skin:  Negative for itching and rash.  Neurological:  Negative for extremity weakness.  Hematological:  Negative for adenopathy.  Psychiatric/Behavioral:  Negative for confusion.      Allergies  Allergen Reactions   Erythromycin Anaphylaxis   Sulfa Antibiotics Anaphylaxis   Tylenol [Acetaminophen] Other (See Comments)    Pancreatitis and liver dysfunction, not supposed to take med   Nsaids Other (See Comments)    Causes GI problems, very bad reaction-per patient.     Gabapentin     Mood changes      Past Medical History:  Diagnosis Date   Alcohol abuse    Anxiety    Anxiety    Anxiety and depression    Asthma    Bipolar disorder ([name])    Borderline personality disorder ([name])    Depression    Diabetes ([name])    Type 2    Dyslipidemia    Esophageal varices ([name])    Foot drop    right   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[name])    GERD (gastroesophageal reflux disease)    Gout    History of drug abuse ([name])    Insomnia    Muscular dystrophies ([name])    Neuropathy    Pancreatitis    Recovering alcoholic ([name])      Past Surgical History:  Procedure Laterality Date   HERNIA REPAIR     IR FLUORO GUIDE CV LINE RIGHT  12/30/2020   IR REMOVAL TUN CV CATH W/O FL  01/19/2021   IR US GUIDE VASC ACCESS RIGHT  12/30/2020    Social History   Socioeconomic History   Marital status: Legally Separated    Spouse name: Not on file   Number of children: Not on file   Years of education: Not on file   Highest education level: Not on file  Occupational History   Occupation: disabled  Tobacco Use   Smoking status: Every Day    Packs/day: 0.50    Types: Cigarettes   Smokeless tobacco: Never  Substance and Sexual Activity   Alcohol use: Yes   Drug  use: Yes    Types: IV, Cocaine   Sexual activity: Yes    Birth control/protection: None  Other Topics Concern   Not on file  Social History Narrative   Not on file   Social Determinants of Health   Financial Resource Strain: Not on file  Food Insecurity: Not on file  Transportation Needs: Not on file  Physical Activity: Not on file  Stress: Not on file  Social Connections: Not on file  Intimate Partner Violence: Not on file    Family History  Problem Relation Age of Onset   Heart disease Mother    Heart attack Mother    Hyperlipidemia Maternal Grandmother    Diabetes Maternal Grandmother    Hypertension Maternal Grandmother    Hyperlipidemia Maternal Grandfather    Heart  disease Maternal Grandfather    Heart attack Maternal Grandfather    Diabetes Maternal Grandfather    Lung cancer Maternal Grandfather    Hypertension Maternal Grandfather    Other Paternal Grandmother        Thyroid problems    Heart disease Paternal Grandfather    Stroke Paternal Grandfather    Hyperlipidemia Paternal Grandfather    Heart attack Paternal Grandfather    Hypertension Paternal Grandfather      Current Outpatient Medications:    albuterol (VENTOLIN HFA) 108 (90 Base) MCG/ACT inhaler, Inhale 2 puffs into the lungs every 6 (six) hours as needed for wheezing., Disp: 6.7 g, Rfl: 1   atorvastatin (LIPITOR) 40 MG tablet, Take 1 tablet (40 mg total) by mouth daily., Disp: 30 tablet, Rfl: 1   budesonide (ENTOCORT EC) 3 MG 24 hr capsule, Take 9 mg by mouth daily., Disp: , Rfl:    calcium-vitamin D (OSCAL WITH D) 500-200 MG-UNIT tablet, Take 1 tablet by mouth daily with breakfast., Disp: 30 tablet, Rfl: 1   dicyclomine (BENTYL) 20 MG tablet, Take 20 mg by mouth 3 (three) times daily., Disp: , Rfl:    diphenoxylate-atropine (LOMOTIL) 2.5-0.025 MG tablet, Take 1 tablet by mouth in the morning, at noon, in the evening, and at bedtime., Disp: , Rfl:    esomeprazole (NEXIUM) 40 MG capsule, Take 40 mg by mouth daily., Disp: , Rfl:    FLUoxetine (PROZAC) 40 MG capsule, Take 2 capsules (80 mg total) by mouth daily., Disp: 60 capsule, Rfl: 1   fluticasone (FLONASE) 50 MCG/ACT nasal spray, Place 1 spray into both nostrils daily., Disp: 9.9 mL, Rfl: 1   folic acid (FOLVITE) 1 MG tablet, Take 1 tablet (1 mg total) by mouth daily., Disp: , Rfl:    furosemide (LASIX) 40 MG tablet, Take 1 tablet (40 mg total) by mouth daily., Disp: 30 tablet, Rfl:    insulin aspart (NOVOLOG) 100 UNIT/ML injection, Inject 12 Units into the skin 3 (three) times daily with meals., Disp: 10 mL, Rfl: 11   insulin glargine-yfgn (SEMGLEE) 100 UNIT/ML injection, Inject 0.28 mLs (28 Units total) into the skin 2 (two) times  daily., Disp: 10 mL, Rfl: 11   lactulose (CHRONULAC) 10 GM/15ML solution, Take 30 mLs (20 g total) by mouth 2 (two) times daily as needed for mild constipation., Disp: 236 mL, Rfl: 0   magnesium oxide (MAG-OX) 400 (240 Mg) MG tablet, Take 1 tablet (400 mg total) by mouth 2 (two) times daily., Disp: , Rfl:    methocarbamol (ROBAXIN) 500 MG tablet, Take 500 mg by mouth 2 (two) times daily as needed., Disp: , Rfl:    Multiple Vitamin (MULTIVITAMIN WITH MINERALS) TABS  tablet, Take 1 tablet by mouth daily., Disp: 30 tablet, Rfl: 1   naloxone (NARCAN) nasal spray 4 mg/0.1 mL, Take as needed for opioid overdose, Disp: 2 each, Rfl: 0   OLANZapine (ZYPREXA) 5 MG tablet, Take 1 tablet (5 mg total) by mouth at bedtime., Disp: , Rfl:    pantoprazole (PROTONIX) 40 MG tablet, Take 1 tablet (40 mg total) by mouth daily., Disp: 30 tablet, Rfl: 1   pregabalin (LYRICA) 75 MG capsule, Take 75-150 mg by mouth 3 (three) times daily., Disp: , Rfl:    promethazine (PHENERGAN) 25 MG tablet, Take 25 mg by mouth every 6 (six) hours as needed for nausea/vomiting., Disp: , Rfl:    rifaximin (XIFAXAN) 550 MG TABS tablet, Take 1 tablet (550 mg total) by mouth 2 (two) times daily., Disp: 42 tablet, Rfl:    spironolactone (ALDACTONE) 100 MG tablet, Take 1 tablet (100 mg total) by mouth daily., Disp: , Rfl:    thiamine 100 MG tablet, Take 1 tablet (100 mg total) by mouth daily., Disp: , Rfl:   Physical exam:  Vitals:   02/01/21 1105  BP: 111/72  Pulse: 94  Temp: 97.6 F (36.4 C)  TempSrc: Tympanic   Physical Exam Constitutional:      General: She is not in acute distress.    Appearance: She is obese.     Comments: Patient sits in the wheelchair  HENT:     Head: Normocephalic and atraumatic.  Eyes:     General: Scleral icterus present.  Cardiovascular:     Rate and Rhythm: Normal rate and regular rhythm.     Heart sounds: Normal heart sounds.  Pulmonary:     Effort: Pulmonary effort is normal. No respiratory  distress.     Breath sounds: No wheezing.  Abdominal:     General: Bowel sounds are normal.     Palpations: Abdomen is soft.  Musculoskeletal:        General: No deformity. Normal range of motion.     Cervical back: Normal range of motion and neck supple.  Skin:    General: Skin is warm and dry.     Findings: No erythema or rash.     Comments: Slightly jaundiced  Neurological:     Mental Status: She is alert and oriented to person, place, and time. Mental status is at baseline.     Cranial Nerves: No cranial nerve deficit.     Coordination: Coordination normal.  Psychiatric:        Mood and Affect: Mood normal.    CMP Latest Ref Rng & Units 02/01/2021  Glucose 70 - 99 mg/dL 194(H)  BUN 6 - 20 mg/dL 16  Creatinine 0.44 - 1.00 mg/dL 0.70  Sodium 135 - 145 mmol/L 135  Potassium 3.5 - 5.1 mmol/L 3.4(L)  Chloride 98 - 111 mmol/L 98  CO2 22 - 32 mmol/L 25  Calcium 8.9 - 10.3 mg/dL 7.9(L)  Total Protein 6.5 - 8.1 g/dL 7.8  Total Bilirubin 0.3 - 1.2 mg/dL 2.9(H)  Alkaline Phos 38 - 126 U/L 324(H)  AST 15 - 41 U/L 93(H)  ALT 0 - 44 U/L 20   CBC Latest Ref Rng & Units 02/01/2021  WBC 4.0 - 10.5 K/uL 26.3(H)  Hemoglobin 12.0 - 15.0 g/dL 9.1(L)  Hematocrit 36.0 - 46.0 % 30.5(L)  Platelets 150 - 400 K/uL 385    RADIOGRAPHIC STUDIES: I have personally reviewed the radiological images as listed and agreed with the findings in the report. DG Chest  2 View  Result Date: 01/05/2021 CLINICAL DATA:  Aspiration pneumonia. EXAM: CHEST - 2 VIEW COMPARISON:  January 02, 2021. FINDINGS: Stable cardiomediastinal silhouette. Stable elevated right hemidiaphragm is noted with associated right basilar atelectasis and effusion. Mild left basilar subsegmental atelectasis is noted. Right-sided PICC line is unchanged in position. Bony thorax is unremarkable. IMPRESSION: Stable elevated right hemidiaphragm is noted with associated right basilar atelectasis and effusion. Mild left basilar subsegmental  atelectasis is noted. Electronically Signed   By: Lupita RaiderJames  Green Jr M.D.   On: 01/05/2021 17:08   DG Abd 1 View  Result Date: 01/10/2021 CLINICAL DATA:  Abdomen pain fluid retention EXAM: ABDOMEN - 1 VIEW COMPARISON:  MRI 12/23/2020 FINDINGS: Limited by habitus. Overall nonobstructed gas pattern. No radiopaque calculi. IMPRESSION: Nonobstructed gas pattern Electronically Signed   By: Jasmine PangKim  Fujinaga M.D.   On: 01/10/2021 15:47   CT CHEST WO CONTRAST  Result Date: 01/17/2021 CLINICAL DATA:  Pneumonia, effusion or abscess suspected, xray done persistent leukocytosis EXAM: CT CHEST WITHOUT CONTRAST TECHNIQUE: Multidetector CT imaging of the chest was performed following the standard protocol without IV contrast. COMPARISON:  CT chest 12/29/2020 FINDINGS: Lines and tubes: Right central venous catheter with tip terminating at the superior cavoatrial junction. Cardiovascular: Normal heart size. No significant pericardial effusion. The thoracic aorta is normal in caliber. Likely 2 vessel coronary artery calcifications. Mediastinum/Nodes: No enlarged mediastinal or axillary lymph nodes. Thyroid gland, trachea, and esophagus demonstrate no significant findings. Lungs/Pleura: Elevated right hemidiaphragm. Bilateral lower lobe peribronchovascular consolidations with air bronchograms. No pulmonary nodule. No pulmonary mass. Interval resolution of right pleural effusion. No pleural effusion. No pneumothorax. Upper Abdomen: No acute abnormality. Musculoskeletal: Subcutaneus soft tissue edema along the lateral right upper abdomen. No suspicious lytic or blastic osseous lesions. No acute displaced fracture. IMPRESSION: 1. Bilateral lower lobe peribronchovascular consolidations with air bronchograms. Findings could represent a combination of infection/inflammation versus atelectasis. Limited evaluation on this noncontrast study. 2. Interval resolution of a right pleural effusion. Elevated right hemidiaphragm. 3. Subcutaneus  soft tissue edema along the lateral right upper abdomen. Electronically Signed   By: Tish FredericksonMorgane  Naveau M.D.   On: 01/17/2021 15:59   US Abdomen Limited  Result Date: 01/21/2021 CLINICAL DATA:  Inpatient.  Evaluate for ascites. EXAM: LIMITED ABDOMEN ULTRASOUND FOR ASCITES TECHNIQUE: Limited ultrasound survey for ascites was performed in all four abdominal quadrants. COMPARISON:  12/23/2020 MRI abdomen. FINDINGS: Trace right greater than left lower quadrant ascites. No ascites in the right upper or left upper quadrants. IMPRESSION: Trace ascites, not sufficient for paracentesis. Electronically Signed   By: Delbert PhenixJason A Poff M.D.   On: 01/21/2021 12:04   US Paracentesis  Result Date: 01/17/2021 INDICATION: Cirrhosis with recurrent ascites. Request received for diagnostic and therapeutic paracentesis. EXAM: ULTRASOUND GUIDED PARACENTESIS MEDICATIONS: Local 1% lidocaine only. COMPLICATIONS: None immediate. PROCEDURE: Informed written consent was obtained from the patient after a discussion of the risks, benefits and alternatives to treatment. A timeout was performed prior to the initiation of the procedure. Initial ultrasound scanning demonstrates a moderate amount of ascites within the right lower abdominal quadrant. The right lower abdomen was prepped and draped in the usual sterile fashion. 1% lidocaine was used for local anesthesia. Following this, a 19 gauge, 10-cm, Yueh catheter was introduced. An ultrasound image was saved for documentation purposes. The paracentesis was performed. The catheter was removed and a dressing was applied. The patient tolerated the procedure well without immediate post procedural complication. FINDINGS: A total of approximately 1800 mL of yellow-colored fluid was  removed. Samples were sent to the laboratory as requested by the clinical team. IMPRESSION: Successful ultrasound-guided paracentesis yielding 1.8 liters of peritoneal fluid. Read By: Tsosie Billing PA-C Electronically Signed    By: Lucrezia Europe M.D.   On: 01/17/2021 12:39   US Paracentesis  Result Date: 01/12/2021 INDICATION: Ascites EXAM: ULTRASOUND GUIDED  PARACENTESIS MEDICATIONS: None. COMPLICATIONS: None immediate. PROCEDURE: Informed written consent was obtained from the patient after a discussion of the risks, benefits and alternatives to treatment. A timeout was performed prior to the initiation of the procedure. Initial ultrasound scanning demonstrates a large amount of ascites within the right lower abdominal quadrant. The right lower abdomen was prepped and draped in the usual sterile fashion. 1% lidocaine was used for local anesthesia. Following this, a 19 gauge Yueh catheter was introduced. An ultrasound image was saved for documentation purposes. The paracentesis was performed. The catheter was removed and a dressing was applied. The patient tolerated the procedure well without immediate post procedural complication. FINDINGS: A total of approximately 2 L of clear straw-colored fluid was removed. IMPRESSION: Successful ultrasound-guided paracentesis yielding 2 liters of clear straw-colored peritoneal fluid. Electronically Signed   By: Albin Felling M.D.   On: 01/12/2021 13:56   US Paracentesis  Result Date: 01/06/2021 INDICATION: Ascites due to alcoholic hepatitis. EXAM: ULTRASOUND GUIDED  PARACENTESIS MEDICATIONS: None. COMPLICATIONS: None immediate. PROCEDURE: Informed written consent was obtained from the patient after a discussion of the risks, benefits and alternatives to treatment. A timeout was performed prior to the initiation of the procedure. Initial ultrasound scanning demonstrates a moderate amount of ascites within the right lower abdominal quadrant. The right lower abdomen was prepped and draped in the usual sterile fashion. 1% lidocaine was used for local anesthesia. Following this, a 19 gauge, 10-cm, Yueh catheter was introduced. An ultrasound image was saved for documentation purposes. The  paracentesis was performed. The catheter was removed and a dressing was applied. The patient tolerated the procedure well without immediate post procedural complication. FINDINGS: A total of approximately 750 mL of yellow fluid was removed. Samples were sent to the laboratory as requested by the clinical team. IMPRESSION: Successful ultrasound-guided paracentesis yielding 750 mL of peritoneal fluid. Electronically Signed   By: Markus Daft M.D.   On: 01/06/2021 13:15   IR Removal Tun Cv Cath W/O FL  Result Date: 01/19/2021 CLINICAL DATA:  Status post tunneled central venous catheter placement via right internal jugular vein on 12/30/2020. Due to leukocytosis, request has been made to remove the catheter. EXAM: REMOVAL OF TUNNELED CENTRAL VENOUS CATHETER PROCEDURE: The right chest tunneled central venous catheter site was prepped with chlorhexidine. A sterile gown and gloves were worn during the procedure. A retention suture was cut. The subcutaneous cuff of the dialysis catheter was freed by manual traction. The catheter was then successfully removed in its entirety. A sterile dressing was applied over the catheter exit site. IMPRESSION: Removal of tunneled central venous catheter. The procedure was uncomplicated. Electronically Signed   By: Aletta Edouard M.D.   On: 01/19/2021 14:28   DG Chest Port 1 View  Result Date: 01/21/2021 CLINICAL DATA:  Hypoxia EXAM: PORTABLE CHEST 1 VIEW COMPARISON:  01/17/2021 chest radiograph. FINDINGS: Stable cardiomediastinal silhouette with top-normal heart size. No pneumothorax. Stable mild blunting of the right costophrenic angle. No left pleural effusion. Mild-to-moderate bibasilar atelectasis, similar. Stable elevation of the right hemidiaphragm. IMPRESSION: 1. Stable mild-to-moderate bibasilar atelectasis. 2. Stable mild blunting of the right costophrenic angle, cannot exclude small right pleural effusion.  Electronically Signed   By: Ilona Sorrel M.D.   On: 01/21/2021  12:06   DG Chest Port 1 View  Result Date: 01/17/2021 CLINICAL DATA:  Leukocytosis EXAM: PORTABLE CHEST 1 VIEW COMPARISON:  Previous studies including the examination of 01/05/2021 FINDINGS: Transverse diameter of heart is increased. There are linear densities in left mid and left lower lung fields with interval worsening. There is marked elevation of right hemidiaphragm. There is homogeneous opacification in the right lower lung fields with no significant interval change. Left lateral CP angle is clear. There is no pneumothorax. Tip of central venous catheter is seen in superior vena cava. IMPRESSION: Cardiomegaly. Opacification of right mid and lower lung fields has not changed significantly. This may be due to elevation of right hemidiaphragm along with atelectasis and pleural effusion. There is interval increase in linear densities in left mid and left lower lung fields suggesting worsening of subsegmental atelectasis/pneumonitis. Electronically Signed   By: Elmer Picker M.D.   On: 01/17/2021 11:02     Assessment and plan  1. Leukocytosis, unspecified type   2. Alcoholic cirrhosis of liver with ascites ([place])   3. Macrocytic anemia    #Leukocytosis, likely reactive due to recent  Infection.  Clinically she is doing better. Labs reviewed and discussed with patient.  BCR ABL1 negative, peripheral blood flow cytometry negative, normal LDH. Repeat CBC today showed that leukocytosis has trended down to 26.3.  #History of chronic alcohol use, Liver cirrhosis. Patient is interested in establishing care with gastroenterology locally.  I will refer to [facility] I will defer to gastroenterology for Ultrasound liver surveillance for cirrhosis.  #Macrocytic anemia MCV 110.  Hemoglobin 9.1.  Hemoglobin has slightly improved comparing to the level at discharge. Patient has adequate vitamin B12 and folate.   Continue thiamine,Folic acid,  Orders Placed This Encounter  Procedures   CBC with  Differential/Platelet    Standing Status:   Future    Number of Occurrences:   1    Standing Expiration Date:   02/01/2022   Comprehensive metabolic panel    Standing Status:   Future    Number of Occurrences:   1    Standing Expiration Date:   02/01/2022   Retic Panel    Standing Status:   Future    Number of Occurrences:   1    Standing Expiration Date:   02/01/2022   Ambulatory referral to Gastroenterology    Referral Priority:   Routine    Referral Type:   Consultation    Referral Reason:   Specialty Services Required    Referred to Provider:   Lin Landsman, MD    Number of Visits Requested:   1     Follow up in 6 months.    Earlie Server, MD, PhD [facility] Hematology Oncology 02/01/2021

## 2021-02-01 NOTE — Progress Notes (Signed)
1030 Bathroom alarm was activated.  Two RNs responded to bathroom and found patient sitting in the floor directly in front of wheelchair with caregiver from patient's facility present.  Patient and caregiver states patient had slipped of of the wheelchair when she attempted to transition from wheelchair to toilet.  Patient denies any injuries.  Patient A&O and no visible injuries noted.  2 RNs assisted patient back into wheelchair, reassessed for any injuries and patient denied any further needs. MD notified.

## 2021-02-02 ENCOUNTER — Telehealth: Payer: Self-pay

## 2021-02-02 ENCOUNTER — Other Ambulatory Visit: Payer: Self-pay

## 2021-02-02 DIAGNOSIS — J690 Pneumonitis due to inhalation of food and vomit: Secondary | ICD-10-CM | POA: Diagnosis not present

## 2021-02-02 DIAGNOSIS — E119 Type 2 diabetes mellitus without complications: Secondary | ICD-10-CM | POA: Diagnosis not present

## 2021-02-02 DIAGNOSIS — N390 Urinary tract infection, site not specified: Secondary | ICD-10-CM | POA: Diagnosis not present

## 2021-02-02 DIAGNOSIS — D72829 Elevated white blood cell count, unspecified: Secondary | ICD-10-CM

## 2021-02-02 DIAGNOSIS — L8990 Pressure ulcer of unspecified site, unspecified stage: Secondary | ICD-10-CM | POA: Diagnosis not present

## 2021-02-02 NOTE — Telephone Encounter (Signed)
-----   Message from Katharine Look, CMA sent at 02/02/2021  8:36 AM EST ----- Victorino Dike please schedule patient for Labs in 6 months and MD 1-2 days after. Thanks  ----- Message ----- From: Rickard Patience, MD Sent: 02/01/2021  10:33 PM EST To: Coralee Rud, RN, Amy Meredith Mody, CMA  Let her know that her white count has trended down which is good.  Recommend her to follow up in 6 months, cbc cmp, vitamin b12, folate, lab prior to MD  please order

## 2021-02-02 NOTE — Telephone Encounter (Signed)
Spoke with patient and got her scheduled.

## 2021-02-02 NOTE — Telephone Encounter (Signed)
Jennifer please schedule patient for Labs in 6 months and MD 1-2 days after. Thanks

## 2021-02-02 NOTE — Telephone Encounter (Signed)
Attempted to contact patient regarding fall while at clinic for appt. on 02/01/2021. No answer. VM left informing patient that if she had any questions or concerns regarding fall to contact clinic. Clinic number provided.

## 2021-02-06 DIAGNOSIS — Z5320 Procedure and treatment not carried out because of patient's decision for unspecified reasons: Secondary | ICD-10-CM | POA: Diagnosis not present

## 2021-02-06 DIAGNOSIS — Z79899 Other long term (current) drug therapy: Secondary | ICD-10-CM | POA: Diagnosis not present

## 2021-02-06 DIAGNOSIS — E1165 Type 2 diabetes mellitus with hyperglycemia: Secondary | ICD-10-CM | POA: Diagnosis not present

## 2021-02-06 DIAGNOSIS — G629 Polyneuropathy, unspecified: Secondary | ICD-10-CM | POA: Diagnosis not present

## 2021-02-10 DIAGNOSIS — G629 Polyneuropathy, unspecified: Secondary | ICD-10-CM | POA: Diagnosis not present

## 2021-02-10 DIAGNOSIS — E119 Type 2 diabetes mellitus without complications: Secondary | ICD-10-CM | POA: Diagnosis not present

## 2021-02-10 DIAGNOSIS — Z79899 Other long term (current) drug therapy: Secondary | ICD-10-CM | POA: Diagnosis not present

## 2021-02-10 DIAGNOSIS — Z794 Long term (current) use of insulin: Secondary | ICD-10-CM | POA: Diagnosis not present

## 2021-02-10 DIAGNOSIS — E1165 Type 2 diabetes mellitus with hyperglycemia: Secondary | ICD-10-CM | POA: Diagnosis not present

## 2021-02-11 ENCOUNTER — Other Ambulatory Visit: Payer: Self-pay

## 2021-02-11 ENCOUNTER — Inpatient Hospital Stay
Admission: EM | Admit: 2021-02-11 | Discharge: 2021-02-15 | DRG: 641 | Disposition: A | Discharge status: Home Health | Payer: Medicare Other | Attending: Internal Medicine | Admitting: Internal Medicine

## 2021-02-11 ENCOUNTER — Encounter: Payer: Self-pay | Admitting: Emergency Medicine

## 2021-02-11 ENCOUNTER — Emergency Department: Payer: Medicare Other

## 2021-02-11 DIAGNOSIS — Z833 Family history of diabetes mellitus: Secondary | ICD-10-CM | POA: Diagnosis not present

## 2021-02-11 DIAGNOSIS — K7030 Alcoholic cirrhosis of liver without ascites: Secondary | ICD-10-CM | POA: Diagnosis present

## 2021-02-11 DIAGNOSIS — F603 Borderline personality disorder: Secondary | ICD-10-CM | POA: Diagnosis present

## 2021-02-11 DIAGNOSIS — Z79899 Other long term (current) drug therapy: Secondary | ICD-10-CM

## 2021-02-11 DIAGNOSIS — M79631 Pain in right forearm: Secondary | ICD-10-CM | POA: Diagnosis not present

## 2021-02-11 DIAGNOSIS — K760 Fatty (change of) liver, not elsewhere classified: Secondary | ICD-10-CM | POA: Diagnosis not present

## 2021-02-11 DIAGNOSIS — M109 Gout, unspecified: Secondary | ICD-10-CM | POA: Diagnosis present

## 2021-02-11 DIAGNOSIS — F1021 Alcohol dependence, in remission: Secondary | ICD-10-CM | POA: Diagnosis present

## 2021-02-11 DIAGNOSIS — G7102 Facioscapulohumeral muscular dystrophy: Secondary | ICD-10-CM | POA: Diagnosis present

## 2021-02-11 DIAGNOSIS — E871 Hypo-osmolality and hyponatremia: Principal | ICD-10-CM | POA: Diagnosis present

## 2021-02-11 DIAGNOSIS — E669 Obesity, unspecified: Secondary | ICD-10-CM | POA: Diagnosis present

## 2021-02-11 DIAGNOSIS — D72829 Elevated white blood cell count, unspecified: Secondary | ICD-10-CM | POA: Diagnosis present

## 2021-02-11 DIAGNOSIS — E785 Hyperlipidemia, unspecified: Secondary | ICD-10-CM | POA: Diagnosis present

## 2021-02-11 DIAGNOSIS — D72823 Leukemoid reaction: Secondary | ICD-10-CM | POA: Diagnosis present

## 2021-02-11 DIAGNOSIS — F3181 Bipolar II disorder: Secondary | ICD-10-CM | POA: Diagnosis present

## 2021-02-11 DIAGNOSIS — Z20822 Contact with and (suspected) exposure to covid-19: Secondary | ICD-10-CM | POA: Diagnosis present

## 2021-02-11 DIAGNOSIS — Z801 Family history of malignant neoplasm of trachea, bronchus and lung: Secondary | ICD-10-CM | POA: Diagnosis not present

## 2021-02-11 DIAGNOSIS — E1165 Type 2 diabetes mellitus with hyperglycemia: Secondary | ICD-10-CM | POA: Diagnosis present

## 2021-02-11 DIAGNOSIS — R296 Repeated falls: Secondary | ICD-10-CM | POA: Diagnosis not present

## 2021-02-11 DIAGNOSIS — Z91119 Patient's noncompliance with dietary regimen due to unspecified reason: Secondary | ICD-10-CM

## 2021-02-11 DIAGNOSIS — K709 Alcoholic liver disease, unspecified: Secondary | ICD-10-CM | POA: Diagnosis present

## 2021-02-11 DIAGNOSIS — Z823 Family history of stroke: Secondary | ICD-10-CM

## 2021-02-11 DIAGNOSIS — Z743 Need for continuous supervision: Secondary | ICD-10-CM | POA: Diagnosis not present

## 2021-02-11 DIAGNOSIS — J45909 Unspecified asthma, uncomplicated: Secondary | ICD-10-CM | POA: Diagnosis not present

## 2021-02-11 DIAGNOSIS — S0990XA Unspecified injury of head, initial encounter: Secondary | ICD-10-CM | POA: Diagnosis not present

## 2021-02-11 DIAGNOSIS — Z8249 Family history of ischemic heart disease and other diseases of the circulatory system: Secondary | ICD-10-CM

## 2021-02-11 DIAGNOSIS — Z794 Long term (current) use of insulin: Secondary | ICD-10-CM

## 2021-02-11 DIAGNOSIS — R0902 Hypoxemia: Secondary | ICD-10-CM | POA: Diagnosis not present

## 2021-02-11 DIAGNOSIS — K219 Gastro-esophageal reflux disease without esophagitis: Secondary | ICD-10-CM | POA: Diagnosis present

## 2021-02-11 DIAGNOSIS — E119 Type 2 diabetes mellitus without complications: Secondary | ICD-10-CM

## 2021-02-11 DIAGNOSIS — W19XXXA Unspecified fall, initial encounter: Secondary | ICD-10-CM | POA: Diagnosis not present

## 2021-02-11 DIAGNOSIS — S6991XA Unspecified injury of right wrist, hand and finger(s), initial encounter: Secondary | ICD-10-CM | POA: Diagnosis not present

## 2021-02-11 DIAGNOSIS — R531 Weakness: Secondary | ICD-10-CM

## 2021-02-11 DIAGNOSIS — E1140 Type 2 diabetes mellitus with diabetic neuropathy, unspecified: Secondary | ICD-10-CM | POA: Diagnosis present

## 2021-02-11 DIAGNOSIS — M25539 Pain in unspecified wrist: Secondary | ICD-10-CM | POA: Diagnosis not present

## 2021-02-11 DIAGNOSIS — S8991XA Unspecified injury of right lower leg, initial encounter: Secondary | ICD-10-CM | POA: Diagnosis not present

## 2021-02-11 DIAGNOSIS — F17210 Nicotine dependence, cigarettes, uncomplicated: Secondary | ICD-10-CM | POA: Diagnosis present

## 2021-02-11 LAB — BASIC METABOLIC PANEL WITH GFR
Anion gap: 8 (ref 5–15)
BUN: 15 mg/dL (ref 6–20)
CO2: 29 mmol/L (ref 22–32)
Calcium: 8.8 mg/dL — ABNORMAL LOW (ref 8.9–10.3)
Chloride: 100 mmol/L (ref 98–111)
Creatinine, Ser: 0.65 mg/dL (ref 0.44–1.00)
GFR, Estimated: >60 mL/min
Glucose, Bld: 165 mg/dL — ABNORMAL HIGH (ref 70–99)
Potassium: 3.3 mmol/L — ABNORMAL LOW (ref 3.5–5.1)
Sodium: 137 mmol/L (ref 135–145)

## 2021-02-11 LAB — CBC
HCT: 34 % — ABNORMAL LOW (ref 36.0–46.0)
Hemoglobin: 10.7 g/dL — ABNORMAL LOW (ref 12.0–15.0)
MCH: 33.5 pg (ref 26.0–34.0)
MCHC: 31.5 g/dL (ref 30.0–36.0)
MCV: 106.6 fL — ABNORMAL HIGH (ref 80.0–100.0)
Platelets: 241 10*3/uL (ref 150–400)
RBC: 3.19 MIL/uL — ABNORMAL LOW (ref 3.87–5.11)
RDW: 15.1 % (ref 11.5–15.5)
WBC: 16.1 10*3/uL — ABNORMAL HIGH (ref 4.0–10.5)
nRBC: 0 % (ref 0.0–0.2)

## 2021-02-11 LAB — HEPATIC FUNCTION PANEL
ALT: 19 U/L (ref 0–44)
AST: 87 U/L — ABNORMAL HIGH (ref 15–41)
Albumin: 2.7 g/dL — ABNORMAL LOW (ref 3.5–5.0)
Alkaline Phosphatase: 274 U/L — ABNORMAL HIGH (ref 38–126)
Bilirubin, Direct: 0.9 mg/dL — ABNORMAL HIGH (ref 0.0–0.2)
Indirect Bilirubin: 1.3 mg/dL — ABNORMAL HIGH (ref 0.3–0.9)
Total Bilirubin: 2.2 mg/dL — ABNORMAL HIGH (ref 0.3–1.2)
Total Protein: 8 g/dL (ref 6.5–8.1)

## 2021-02-11 LAB — CK: Total CK: 62 U/L (ref 38–234)

## 2021-02-11 MED ORDER — DICYCLOMINE HCL 20 MG PO TABS
20.0000 mg | ORAL_TABLET | Freq: Three times a day (TID) | ORAL | Status: DC
  Administered 2021-02-12 – 2021-02-15 (×10): 20 mg via ORAL
  Filled 2021-02-11 (×12): qty 1

## 2021-02-11 MED ORDER — FOLIC ACID 1 MG PO TABS
1.0000 mg | ORAL_TABLET | Freq: Every day | ORAL | Status: DC
  Administered 2021-02-12 – 2021-02-15 (×4): 1 mg via ORAL
  Filled 2021-02-11 (×4): qty 1

## 2021-02-11 MED ORDER — PANTOPRAZOLE SODIUM 40 MG PO TBEC
40.0000 mg | DELAYED_RELEASE_TABLET | Freq: Every day | ORAL | Status: DC
  Filled 2021-02-11: qty 1

## 2021-02-11 MED ORDER — METHOCARBAMOL 500 MG PO TABS
500.0000 mg | ORAL_TABLET | Freq: Two times a day (BID) | ORAL | Status: DC | PRN
  Administered 2021-02-11 – 2021-02-14 (×4): 500 mg via ORAL
  Filled 2021-02-11 (×7): qty 1

## 2021-02-11 MED ORDER — ALBUTEROL SULFATE (2.5 MG/3ML) 0.083% IN NEBU: 2.5000 mg | INHALATION_SOLUTION | Freq: Four times a day (QID) | RESPIRATORY_TRACT | Status: DC | PRN

## 2021-02-11 MED ORDER — OYSTER SHELL CALCIUM/D3 500-5 MG-MCG PO TABS
1.0000 | ORAL_TABLET | Freq: Every day | ORAL | Status: DC
  Administered 2021-02-12 – 2021-02-15 (×4): 1 via ORAL
  Filled 2021-02-11 (×4): qty 1

## 2021-02-11 MED ORDER — ALBUTEROL SULFATE HFA 108 (90 BASE) MCG/ACT IN AERS: 2.0000 | INHALATION_SPRAY | Freq: Four times a day (QID) | RESPIRATORY_TRACT | Status: DC | PRN

## 2021-02-11 MED ORDER — FUROSEMIDE 40 MG PO TABS
40.0000 mg | ORAL_TABLET | Freq: Every day | ORAL | Status: DC
  Administered 2021-02-12 – 2021-02-15 (×4): 40 mg via ORAL
  Filled 2021-02-11 (×4): qty 1

## 2021-02-11 MED ORDER — BUDESONIDE 3 MG PO CPEP
9.0000 mg | ORAL_CAPSULE | Freq: Every day | ORAL | Status: DC
  Administered 2021-02-12 – 2021-02-15 (×4): 9 mg via ORAL
  Filled 2021-02-11 (×4): qty 3

## 2021-02-11 MED ORDER — PROMETHAZINE HCL 25 MG PO TABS
25.0000 mg | ORAL_TABLET | Freq: Four times a day (QID) | ORAL | Status: DC | PRN
  Filled 2021-02-11: qty 1

## 2021-02-11 MED ORDER — ATORVASTATIN CALCIUM 20 MG PO TABS
40.0000 mg | ORAL_TABLET | Freq: Every day | ORAL | Status: DC
  Administered 2021-02-12 – 2021-02-15 (×4): 40 mg via ORAL
  Filled 2021-02-11 (×4): qty 2

## 2021-02-11 MED ORDER — DIPHENOXYLATE-ATROPINE 2.5-0.025 MG PO TABS: 1.0000 | ORAL_TABLET | Freq: Four times a day (QID) | ORAL | Status: DC | PRN

## 2021-02-11 MED ORDER — FLUOXETINE HCL 20 MG PO CAPS
80.0000 mg | ORAL_CAPSULE | Freq: Every day | ORAL | Status: DC
  Administered 2021-02-12 – 2021-02-15 (×4): 80 mg via ORAL
  Filled 2021-02-11 (×4): qty 4

## 2021-02-11 MED ORDER — SPIRONOLACTONE 25 MG PO TABS
100.0000 mg | ORAL_TABLET | Freq: Every day | ORAL | Status: DC
  Administered 2021-02-12 – 2021-02-15 (×4): 100 mg via ORAL
  Filled 2021-02-11 (×4): qty 4

## 2021-02-11 MED ORDER — INSULIN ASPART 100 UNIT/ML IJ SOLN
12.0000 [IU] | Freq: Three times a day (TID) | INTRAMUSCULAR | Status: DC
  Administered 2021-02-12 – 2021-02-14 (×7): 12 [IU] via SUBCUTANEOUS
  Filled 2021-02-11 (×9): qty 1

## 2021-02-11 MED ORDER — MAGNESIUM OXIDE -MG SUPPLEMENT 400 (240 MG) MG PO TABS
400.0000 mg | ORAL_TABLET | Freq: Two times a day (BID) | ORAL | Status: DC
  Administered 2021-02-12 – 2021-02-15 (×7): 400 mg via ORAL
  Filled 2021-02-11 (×7): qty 1

## 2021-02-11 MED ORDER — THIAMINE HCL 100 MG PO TABS
100.0000 mg | ORAL_TABLET | Freq: Every day | ORAL | Status: DC
  Administered 2021-02-12 – 2021-02-15 (×4): 100 mg via ORAL
  Filled 2021-02-11 (×4): qty 1

## 2021-02-11 MED ORDER — ADULT MULTIVITAMIN W/MINERALS CH
1.0000 | ORAL_TABLET | Freq: Every day | ORAL | Status: DC
  Administered 2021-02-13 – 2021-02-15 (×3): 1 via ORAL
  Filled 2021-02-11 (×3): qty 1

## 2021-02-11 MED ORDER — PREGABALIN 25 MG PO CAPS
75.0000 mg | ORAL_CAPSULE | Freq: Three times a day (TID) | ORAL | Status: DC
  Administered 2021-02-11 – 2021-02-12 (×3): 150 mg via ORAL
  Administered 2021-02-12: 17:00:00 100 mg via ORAL
  Administered 2021-02-13: 21:00:00 150 mg via ORAL
  Administered 2021-02-13 – 2021-02-15 (×6): 75 mg via ORAL
  Filled 2021-02-11: qty 3
  Filled 2021-02-11: qty 6
  Filled 2021-02-11: qty 3
  Filled 2021-02-11 (×2): qty 6
  Filled 2021-02-11 (×3): qty 3
  Filled 2021-02-11: qty 4
  Filled 2021-02-11: qty 6
  Filled 2021-02-11: qty 3

## 2021-02-11 MED ORDER — INSULIN GLARGINE-YFGN 100 UNIT/ML ~~LOC~~ SOLN
28.0000 [IU] | Freq: Two times a day (BID) | SUBCUTANEOUS | Status: DC
  Administered 2021-02-12 – 2021-02-14 (×5): 28 [IU] via SUBCUTANEOUS
  Filled 2021-02-11 (×6): qty 0.28

## 2021-02-11 MED ORDER — PANTOPRAZOLE SODIUM 40 MG PO TBEC
40.0000 mg | DELAYED_RELEASE_TABLET | Freq: Every day | ORAL | Status: DC
  Administered 2021-02-12 – 2021-02-15 (×4): 40 mg via ORAL
  Filled 2021-02-11 (×4): qty 1

## 2021-02-11 MED ORDER — FLUTICASONE PROPIONATE 50 MCG/ACT NA SUSP
1.0000 | Freq: Every day | NASAL | Status: DC
  Administered 2021-02-13 – 2021-02-15 (×3): 1 via NASAL
  Filled 2021-02-11: qty 16

## 2021-02-11 MED ORDER — OLANZAPINE 5 MG PO TABS
5.0000 mg | ORAL_TABLET | Freq: Every day | ORAL | Status: DC
  Administered 2021-02-11 – 2021-02-14 (×4): 5 mg via ORAL
  Filled 2021-02-11 (×5): qty 1

## 2021-02-11 MED ORDER — LACTULOSE 10 GM/15ML PO SOLN: 20.0000 g | Freq: Two times a day (BID) | ORAL | Status: DC | PRN

## 2021-02-11 NOTE — ED Provider Notes (Signed)
Emergency Medicine Provider Triage Evaluation Note  [name] , a 45 y.o. female  was evaluated in triage.  Pt complains of generalized weakness. .  Review of Systems  Positive:  Weak, falling, can't stand Negative:  Syncope, fever  Physical Exam  BP (!) 150/85 (BP Location: Right Arm)   Pulse 95   Temp 97.9 F (36.6 C) (Oral)   Resp 20   Ht 5\' 7"  (1.702 m)   Wt 92.5 kg   LMP  (LMP Unknown)   SpO2 95%   BMI 31.95 kg/m  Gen:   Awake, no distress   Resp:  Normal effort  MSK:   Moves extremities without difficulty  Other:    Medical Decision Making  Medically screening exam initiated at 11:46 AM.  Appropriate orders placed.  [name] was informed that the remainder of the evaluation will be completed by another provider, this initial triage assessment does not replace that evaluation, and the importance of remaining in the ED until their evaluation is complete.     Jasper Riling, MD 02/11/21 [phone]

## 2021-02-11 NOTE — TOC Initial Note (Addendum)
Transition of Care [facility]) - Initial/Assessment Note    Patient Details  Name: [name] MRN: [id number] Date of Birth: 02/13/1976  Transition of Care [facility]) CM/SW Contact:    Bernice Cellar, RN Phone Number: 02/11/2021, 6:24 PM  Clinical Narrative:                 Patient well known to [facility] from previous admissions. Requesting SNF placement after attempting to live in motel with her cousin. Patient recently discharged to [facility] for rehab and discharged home with cousin to motel yesterday after admission to motel. Reporting she is no longer drinking alcohol and is hopeful to return home with her cousin once she is able to use walker again. Will complete Fl2 and send out bed search once PT eval completed . Appears patient has used 15 SNF days and is unable to afford copay days.         Patient Goals and CMS Choice        Expected Discharge Plan and Services                                                Prior Living Arrangements/Services                       Activities of Daily Living      Permission Sought/Granted                  Emotional Assessment              Admission diagnosis:  Weakness, Fall Patient Active Problem List   Diagnosis Date Noted   Leukocytosis    Aspiration pneumonia of both lower lobes due to gastric secretions ([name]) 01/04/2021   Pressure injury of skin 12/24/2020   Acute hepatic encephalopathy 12/20/2020   Sepsis ([name]) 12/19/2020   Acute hyponatremia 12/18/2020   Alcohol use disorder, moderate, dependence ([name]) 12/18/2020   Hepatic steatosis 12/18/2020   Polysubstance abuse ([name]) 12/18/2020   Hyperglycemia due to type 2 diabetes mellitus ([name]) 12/18/2020   Hyperbilirubinemia 12/18/2020   Lactic acidosis 12/18/2020   Alcoholic liver disease ([name]) 12/18/2020   Bipolar 1 disorder, manic, moderate ([name]) 09/10/2018   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   Diabetes ([name]) 09/10/2018   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   Anxiety and depression 10/01/2012   Protein-calorie malnutrition, severe ([name]) 09/30/2012   Alcohol abuse 09/26/2012   Trichomonas infection 09/26/2012   Dehydration 09/26/2012   Hypokalemia 11/14/2011   Thrombocytopenia ([name]) 11/14/2011   Alcohol withdrawal ([name]) 11/13/2011   Bipolar 2 disorder ([name]) 11/13/2011   Tobacco abuse 11/13/2011   Cocaine abuse ([name]) 11/13/2011   Gout 11/13/2011   PCP:  Eunice Blase, PA-C Pharmacy:   [id number] - [place], [place] - [address] AT [facility] [address] [place] [address] [place] [address] Phone: [phone] Fax: [phone]     Social Determinants of Health (SDOH) Interventions    Readmission Risk Interventions No flowsheet data found.

## 2021-02-11 NOTE — ED Triage Notes (Signed)
Pt via EMS from home. Pt was recently d/c from a rehab facility yesterday. Pt has been having multiple falls since discharge. States she has no strength in her legs. She feels like she was d/c too soon.   Pt has a hx of muscular dystrophy

## 2021-02-11 NOTE — ED Provider Notes (Addendum)
[facility] Emergency Department Provider Note  ____________________________________________   Event Date/Time   First MD Initiated Contact with Patient 02/11/21 1609     (approximate)  I have reviewed the triage vital signs and the nursing notes.   HISTORY  Chief Complaint Fall and Weakness   HPI [name] is a 45 y.o. female with facioscapulohumeral muscular dystrophy and a recent discharge she reports she is very weak and has been falling frequently.  She has a walker and a gait belt that her cousin who she is living with has been helping her with but she fell twice in the last 24 hours and now complains of a lot of pain in her right lower leg right knee and right distal forearm.  Patient is currently living on the second floor at a motel.  The motel has no elevator.  The patient cannot make it up the steps either by scooting on her buttocks backwards or by crawling.  She cannot walk up with a walker either.  She reports she is no longer drinking.  They have applied for section 8 housing but have not heard anything.  There is no vacancies on the first floor.      Past Medical History:  Diagnosis Date   Alcohol abuse    Anxiety    Anxiety    Anxiety and depression    Asthma    Bipolar disorder ([name])    Borderline personality disorder ([name])    Depression    Diabetes ([name])    Type 2    Dyslipidemia    Esophageal varices ([name])    Foot drop    right   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[name])    GERD (gastroesophageal reflux disease)    Gout    History of drug abuse ([name])    Insomnia    Muscular dystrophies ([name])    Neuropathy    Pancreatitis    Recovering alcoholic ([name])     Patient Active Problem List   Diagnosis Date Noted   Leukocytosis    Aspiration pneumonia of both lower lobes due to gastric secretions ([name]) 01/04/2021   Pressure injury of skin 12/24/2020   Acute hepatic encephalopathy 12/20/2020   Sepsis ([name])  12/19/2020   Acute hyponatremia 12/18/2020   Alcohol use disorder, moderate, dependence ([name]) 12/18/2020   Hepatic steatosis 12/18/2020   Polysubstance abuse ([name]) 12/18/2020   Hyperglycemia due to type 2 diabetes mellitus ([name]) 12/18/2020   Hyperbilirubinemia 12/18/2020   Lactic acidosis 12/18/2020   Alcoholic liver disease ([name]) 12/18/2020   Bipolar 1 disorder, manic, moderate ([name]) 09/10/2018   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   Diabetes ([name]) 09/10/2018   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   Anxiety and depression 10/01/2012   Protein-calorie malnutrition, severe ([name]) 09/30/2012   Alcohol abuse 09/26/2012   Trichomonas infection 09/26/2012   Dehydration 09/26/2012   Hypokalemia 11/14/2011   Thrombocytopenia ([name]) 11/14/2011   Alcohol withdrawal ([name]) 11/13/2011   Bipolar 2 disorder ([name]) 11/13/2011   Tobacco abuse 11/13/2011   Cocaine abuse ([name]) 11/13/2011   Gout 11/13/2011    Past Surgical History:  Procedure Laterality Date   HERNIA REPAIR     IR FLUORO GUIDE CV LINE RIGHT  12/30/2020   IR REMOVAL TUN CV CATH W/O FL  01/19/2021   IR US GUIDE VASC ACCESS RIGHT  12/30/2020    Prior to Admission medications   Medication Sig Start Date End Date Taking? Authorizing Provider  albuterol (VENTOLIN HFA) 108 (  90 Base) MCG/ACT inhaler Inhale 2 puffs into the lungs every 6 (six) hours as needed for wheezing. 09/15/18   Clapacs, Jackquline Denmark, MD  atorvastatin (LIPITOR) 40 MG tablet Take 1 tablet (40 mg total) by mouth daily. 09/15/18   Clapacs, Jackquline Denmark, MD  budesonide (ENTOCORT EC) 3 MG 24 hr capsule Take 9 mg by mouth daily. 12/09/20   [provider]  calcium-vitamin D (OSCAL WITH D) 500-200 MG-UNIT tablet Take 1 tablet by mouth daily with breakfast. 09/16/18   Clapacs, Jackquline Denmark, MD  dicyclomine (BENTYL) 20 MG tablet Take 20 mg by mouth 3 (three) times daily. 07/02/20   [provider]  diphenoxylate-atropine (LOMOTIL) 2.5-0.025 MG tablet Take 1  tablet by mouth in the morning, at noon, in the evening, and at bedtime. 07/07/20   [provider]  esomeprazole (NEXIUM) 40 MG capsule Take 40 mg by mouth daily. 09/30/20   [provider]  FLUoxetine (PROZAC) 40 MG capsule Take 2 capsules (80 mg total) by mouth daily. 09/15/18   Clapacs, Jackquline Denmark, MD  fluticasone (FLONASE) 50 MCG/ACT nasal spray Place 1 spray into both nostrils daily. 09/16/18   Clapacs, Jackquline Denmark, MD  folic acid (FOLVITE) 1 MG tablet Take 1 tablet (1 mg total) by mouth daily. 01/27/21   Wouk, Wilfred Curtis, MD  furosemide (LASIX) 40 MG tablet Take 1 tablet (40 mg total) by mouth daily. 01/27/21   Wouk, Wilfred Curtis, MD  insulin aspart (NOVOLOG) 100 UNIT/ML injection Inject 12 Units into the skin 3 (three) times daily with meals. 01/26/21   Wouk, Wilfred Curtis, MD  insulin glargine-yfgn (SEMGLEE) 100 UNIT/ML injection Inject 0.28 mLs (28 Units total) into the skin 2 (two) times daily. 01/26/21   Wouk, Wilfred Curtis, MD  lactulose (CHRONULAC) 10 GM/15ML solution Take 30 mLs (20 g total) by mouth 2 (two) times daily as needed for mild constipation. 01/26/21   Wouk, Wilfred Curtis, MD  magnesium oxide (MAG-OX) 400 (240 Mg) MG tablet Take 1 tablet (400 mg total) by mouth 2 (two) times daily. 01/26/21   Wouk, Wilfred Curtis, MD  methocarbamol (ROBAXIN) 500 MG tablet Take 500 mg by mouth 2 (two) times daily as needed. 12/01/20   [provider]  Multiple Vitamin (MULTIVITAMIN WITH MINERALS) TABS tablet Take 1 tablet by mouth daily. 09/16/18   Clapacs, Jackquline Denmark, MD  naloxone [facility]) nasal spray 4 mg/0.1 mL Take as needed for opioid overdose 01/22/19   Shaune Pollack, MD  OLANZapine (ZYPREXA) 5 MG tablet Take 1 tablet (5 mg total) by mouth at bedtime. 01/26/21   Wouk, Wilfred Curtis, MD  pantoprazole (PROTONIX) 40 MG tablet Take 1 tablet (40 mg total) by mouth daily. 09/16/18   Clapacs, Jackquline Denmark, MD  pregabalin (LYRICA) 75 MG capsule Take 75-150 mg by mouth 3 (three) times daily.  11/16/20   [provider]  promethazine (PHENERGAN) 25 MG tablet Take 25 mg by mouth every 6 (six) hours as needed for nausea/vomiting. 12/01/20   [provider]  rifaximin (XIFAXAN) 550 MG TABS tablet Take 1 tablet (550 mg total) by mouth 2 (two) times daily. 01/26/21   Wouk, Wilfred Curtis, MD  spironolactone (ALDACTONE) 100 MG tablet Take 1 tablet (100 mg total) by mouth daily. 01/27/21   Wouk, Wilfred Curtis, MD  thiamine 100 MG tablet Take 1 tablet (100 mg total) by mouth daily. 01/27/21   Wouk, Wilfred Curtis, MD    Allergies Erythromycin, Sulfa antibiotics, Tylenol [acetaminophen], Nsaids, and Gabapentin  Family History  Problem Relation Age of Onset   Heart disease Mother    Heart attack Mother    Hyperlipidemia Maternal Grandmother    Diabetes Maternal Grandmother    Hypertension Maternal Grandmother    Hyperlipidemia Maternal Grandfather    Heart disease Maternal Grandfather    Heart attack Maternal Grandfather    Diabetes Maternal Grandfather    Lung cancer Maternal Grandfather    Hypertension Maternal Grandfather    Other Paternal Grandmother        Thyroid problems    Heart disease Paternal Grandfather    Stroke Paternal Grandfather    Hyperlipidemia Paternal Grandfather    Heart attack Paternal Grandfather    Hypertension Paternal Grandfather     Social History Social History   Tobacco Use   Smoking status: Every Day    Packs/day: 0.50    Types: Cigarettes   Smokeless tobacco: Never  Substance Use Topics   Alcohol use: Yes   Drug use: Yes    Types: IV, Cocaine    Review of Systems  Constitutional: No fever/chills Eyes: No visual changes. ENT: No sore throat. Cardiovascular: Denies chest pain. Respiratory: Denies shortness of breath. Gastrointestinal: No abdominal pain.  No nausea, no vomiting.  No diarrhea.  No constipation. Genitourinary: Negative for dysuria. Musculoskeletal: Negative for back pain. Skin: Negative for  rash. Neurological: Negative for headaches, focal weakness   ____________________________________________   PHYSICAL EXAM:  VITAL SIGNS: ED Triage Vitals  Enc Vitals Group     BP 02/11/21 1136 (!) 150/85     Pulse Rate 02/11/21 1136 95     Resp 02/11/21 1136 20     Temp 02/11/21 1136 97.9 F (36.6 C)     Temp Source 02/11/21 1136 Oral     SpO2 02/11/21 1136 95 %     Weight 02/11/21 1134 204 lb (92.5 kg)     Height 02/11/21 1134 5\' 7"  (1.702 m)     Head Circumference --      Peak Flow --      Pain Score 02/11/21 1134 4     Pain Loc --      Pain Edu? --      Excl. in GC? --     Constitutional: Alert and oriented. Well appearing and in no acute distress. Eyes: Conjunctivae are normal. PER Head: Atraumatic. Nose: No congestion/rhinnorhea. Mouth/Throat: Mucous membranes are moist.  Oropharynx non-erythematous. Neck: No stridor. Cardiovascular: Normal rate, regular rhythm. Grossly normal heart sounds.  Good peripheral circulation. Respiratory: Normal respiratory effort.  No retractions. Lungs CTAB. Gastrointestinal: Soft and nontender. No distention. No abdominal bruits. Musculoskeletal: No lower extremity tenderness except as described in HPI nor edema.  No joint effusions. Neurologic:  Normal speech and language. No new gross focal neurologic deficits are appreciated.  Skin:  Skin is warm, dry and intact. No rash noted.  ____________________________________________   LABS (all labs ordered are listed, but only abnormal results are displayed)  Labs Reviewed  BASIC METABOLIC PANEL - Abnormal; Notable for the following components:      Result Value   Potassium 3.3 (*)    Glucose, Bld 165 (*)    Calcium 8.8 (*)    All other components within normal limits  CBC - Abnormal; Notable for the following components:   WBC 16.1 (*)    RBC 3.19 (*)    Hemoglobin 10.7 (*)    HCT 34.0 (*)    MCV 106.6 (*)    All other components within normal limits  HEPATIC  FUNCTION PANEL -  Abnormal; Notable for the following components:   Albumin 2.7 (*)    AST 87 (*)    Alkaline Phosphatase 274 (*)    Total Bilirubin 2.2 (*)    Bilirubin, Direct 0.9 (*)    Indirect Bilirubin 1.3 (*)    All other components within normal limits  CK  URINALYSIS, ROUTINE W REFLEX MICROSCOPIC  CBG MONITORING, ED  POC URINE PREG, ED   ____________________________________________  EKG   ____________________________________________  RADIOLOGY Jill Poling, personally viewed and evaluated these images (plain radiographs) as part of my medical decision making, as well as reviewing the written report by the radiologist.  ED MD interpretation:    Official radiology report(s): DG Wrist Complete Right  Result Date: 02/11/2021 CLINICAL DATA:  Trauma, fall EXAM: RIGHT WRIST - COMPLETE 3+ VIEW COMPARISON:  None. FINDINGS: No displaced fracture or dislocation is seen. There is 1 mm smooth marginated calcification adjacent to the distal radius, possibly an artifact or old tiny avulsion IMPRESSION: No recent fracture or dislocation is seen. There is 1 mm smooth marginated calcification adjacent to the distal radius may be residual from previous injury. Electronically Signed   By: Ernie Avena M.D.   On: 02/11/2021 16:58   DG Tibia/Fibula Right  Result Date: 02/11/2021 CLINICAL DATA:  Trauma, fall EXAM: RIGHT TIBIA AND FIBULA - 2 VIEW COMPARISON:  None. FINDINGS: There is no evidence of fracture or other focal bone lesions. There are scattered soft tissue calcifications. IMPRESSION: No fracture or dislocation is seen in the right tibia and fibula. Electronically Signed   By: Ernie Avena M.D.   On: 02/11/2021 16:56   DG Knee Complete 4 Views Right  Result Date: 02/11/2021 CLINICAL DATA:  Trauma, fall EXAM: RIGHT KNEE - COMPLETE 4+ VIEW COMPARISON:  None. FINDINGS: No fracture or dislocation is seen. There is 2 mm smooth marginated calcification in the soft tissues adjacent to  the inferior margin of patella, possibly soft tissue calcification from previous injury. IMPRESSION: No recent fracture or dislocation is seen in the right knee. Electronically Signed   By: Ernie Avena M.D.   On: 02/11/2021 16:57    ____________________________________________   PROCEDURES  Procedure(s) performed (including Critical Care):  Procedures   ____________________________________________   INITIAL IMPRESSION / ASSESSMENT AND PLAN / ED COURSE ----------------------------------------- 5:42 PM on 02/11/2021 -----------------------------------------  Patient with elevation of liver function tests more than previously but no abdominal pain.  MRI done last month and previous liver ultrasound do not show anything but marked hepatic steatosis and enlargement.  No gallbladder problems previously.  Similar pattern of elevation of LFTs previously.  I will not reimage her at this time.  She does want help with rehab or some way of strengthening herself.  I will talk to social work about this and putting the PT consult.  Patient reports she cannot get up the stairs to her place of living even with help.  We will have to keep her here is not safe to let her go she cannot manage walking by herself at this point even with a walker.              ____________________________________________   FINAL CLINICAL IMPRESSION(S) / ED DIAGNOSES  Final diagnoses:  Fall, initial encounter  Weakness     ED Discharge Orders     None        Note:  This document was prepared using Dragon voice recognition software and may include unintentional dictation  errors.    Arnaldo Natal, MD 02/11/21 1749 ----------------------------------------- 12:20 AM on 02/12/2021 ----------------------------------------- Micah Flesher to check on the patient again she says she feels okay.  There is still no tenderness on palpation of her abdomen or percussion of her abdomen.   Arnaldo Natal,  MD 02/12/21 Perlie Mayo

## 2021-02-11 NOTE — ED Notes (Signed)
Lab refusing urine specimine due to patient label being difficult to read. This rn sent 2 patient labels on urine to ensure proper identification and ensuring that all patient ID needs being met. Lab endorsing that eve though they can see all the information they are refusing the specimen

## 2021-02-12 ENCOUNTER — Inpatient Hospital Stay: Payer: Medicare Other

## 2021-02-12 DIAGNOSIS — R531 Weakness: Secondary | ICD-10-CM | POA: Diagnosis present

## 2021-02-12 DIAGNOSIS — K7030 Alcoholic cirrhosis of liver without ascites: Secondary | ICD-10-CM | POA: Diagnosis present

## 2021-02-12 DIAGNOSIS — Z20822 Contact with and (suspected) exposure to covid-19: Secondary | ICD-10-CM | POA: Diagnosis present

## 2021-02-12 DIAGNOSIS — K760 Fatty (change of) liver, not elsewhere classified: Secondary | ICD-10-CM | POA: Diagnosis present

## 2021-02-12 DIAGNOSIS — Z79899 Other long term (current) drug therapy: Secondary | ICD-10-CM | POA: Diagnosis not present

## 2021-02-12 DIAGNOSIS — Z794 Long term (current) use of insulin: Secondary | ICD-10-CM | POA: Diagnosis not present

## 2021-02-12 DIAGNOSIS — K219 Gastro-esophageal reflux disease without esophagitis: Secondary | ICD-10-CM | POA: Diagnosis present

## 2021-02-12 DIAGNOSIS — F1021 Alcohol dependence, in remission: Secondary | ICD-10-CM | POA: Diagnosis present

## 2021-02-12 DIAGNOSIS — E785 Hyperlipidemia, unspecified: Secondary | ICD-10-CM | POA: Diagnosis present

## 2021-02-12 DIAGNOSIS — M109 Gout, unspecified: Secondary | ICD-10-CM | POA: Diagnosis present

## 2021-02-12 DIAGNOSIS — R296 Repeated falls: Secondary | ICD-10-CM | POA: Diagnosis present

## 2021-02-12 DIAGNOSIS — Z801 Family history of malignant neoplasm of trachea, bronchus and lung: Secondary | ICD-10-CM | POA: Diagnosis not present

## 2021-02-12 DIAGNOSIS — D72823 Leukemoid reaction: Secondary | ICD-10-CM | POA: Diagnosis present

## 2021-02-12 DIAGNOSIS — E1165 Type 2 diabetes mellitus with hyperglycemia: Secondary | ICD-10-CM | POA: Diagnosis present

## 2021-02-12 DIAGNOSIS — G7102 Facioscapulohumeral muscular dystrophy: Secondary | ICD-10-CM | POA: Diagnosis present

## 2021-02-12 DIAGNOSIS — Z823 Family history of stroke: Secondary | ICD-10-CM | POA: Diagnosis not present

## 2021-02-12 DIAGNOSIS — F3181 Bipolar II disorder: Secondary | ICD-10-CM | POA: Diagnosis present

## 2021-02-12 DIAGNOSIS — J45909 Unspecified asthma, uncomplicated: Secondary | ICD-10-CM | POA: Diagnosis present

## 2021-02-12 DIAGNOSIS — E669 Obesity, unspecified: Secondary | ICD-10-CM | POA: Diagnosis present

## 2021-02-12 DIAGNOSIS — Z8249 Family history of ischemic heart disease and other diseases of the circulatory system: Secondary | ICD-10-CM | POA: Diagnosis not present

## 2021-02-12 DIAGNOSIS — K709 Alcoholic liver disease, unspecified: Secondary | ICD-10-CM | POA: Diagnosis present

## 2021-02-12 DIAGNOSIS — Z833 Family history of diabetes mellitus: Secondary | ICD-10-CM | POA: Diagnosis not present

## 2021-02-12 DIAGNOSIS — F603 Borderline personality disorder: Secondary | ICD-10-CM | POA: Diagnosis present

## 2021-02-12 DIAGNOSIS — E1140 Type 2 diabetes mellitus with diabetic neuropathy, unspecified: Secondary | ICD-10-CM | POA: Diagnosis present

## 2021-02-12 DIAGNOSIS — E871 Hypo-osmolality and hyponatremia: Secondary | ICD-10-CM | POA: Diagnosis present

## 2021-02-12 LAB — CBG MONITORING, ED: Glucose-Capillary: 191 mg/dL — ABNORMAL HIGH (ref 70–99)

## 2021-02-12 LAB — CBC
HCT: 30.7 % — ABNORMAL LOW (ref 36.0–46.0)
Hemoglobin: 9.4 g/dL — ABNORMAL LOW (ref 12.0–15.0)
MCH: 32.1 pg (ref 26.0–34.0)
MCHC: 30.6 g/dL (ref 30.0–36.0)
MCV: 104.8 fL — ABNORMAL HIGH (ref 80.0–100.0)
Platelets: 221 10*3/uL (ref 150–400)
RBC: 2.93 MIL/uL — ABNORMAL LOW (ref 3.87–5.11)
RDW: 15.3 % (ref 11.5–15.5)
WBC: 14.4 10*3/uL — ABNORMAL HIGH (ref 4.0–10.5)
nRBC: 0 % (ref 0.0–0.2)

## 2021-02-12 LAB — GLUCOSE, CAPILLARY
Glucose-Capillary: 191 mg/dL — ABNORMAL HIGH (ref 70–99)
Glucose-Capillary: 130 mg/dL — ABNORMAL HIGH (ref 70–99)
Glucose-Capillary: 341 mg/dL — ABNORMAL HIGH (ref 70–99)
Glucose-Capillary: 277 mg/dL — ABNORMAL HIGH (ref 70–99)

## 2021-02-12 LAB — URINALYSIS, ROUTINE W REFLEX MICROSCOPIC
Bilirubin Urine: NEGATIVE
Glucose, UA: NEGATIVE mg/dL
Ketones, ur: NEGATIVE mg/dL
Nitrite: POSITIVE — AB
Protein, ur: NEGATIVE mg/dL
Specific Gravity, Urine: 1.02 (ref 1.005–1.030)
pH: 6.5 (ref 5.0–8.0)

## 2021-02-12 LAB — COMPREHENSIVE METABOLIC PANEL
ALT: 17 U/L (ref 0–44)
AST: 93 U/L — ABNORMAL HIGH (ref 15–41)
Albumin: 2.4 g/dL — ABNORMAL LOW (ref 3.5–5.0)
Alkaline Phosphatase: 241 U/L — ABNORMAL HIGH (ref 38–126)
Anion gap: 5 (ref 5–15)
BUN: 12 mg/dL (ref 6–20)
CO2: 30 mmol/L (ref 22–32)
Calcium: 8.6 mg/dL — ABNORMAL LOW (ref 8.9–10.3)
Chloride: 104 mmol/L (ref 98–111)
Creatinine, Ser: 0.72 mg/dL (ref 0.44–1.00)
GFR, Estimated: >60 mL/min (ref >60)
Glucose, Bld: 202 mg/dL — ABNORMAL HIGH (ref 70–99)
Potassium: 3.5 mmol/L (ref 3.5–5.1)
Sodium: 139 mmol/L (ref 135–145)
Total Bilirubin: 2 mg/dL — ABNORMAL HIGH (ref 0.3–1.2)
Total Protein: 7.2 g/dL (ref 6.5–8.1)

## 2021-02-12 LAB — URINALYSIS, MICROSCOPIC (REFLEX)

## 2021-02-12 LAB — CREATININE, SERUM
Creatinine, Ser: 0.64 mg/dL (ref 0.44–1.00)
GFR, Estimated: >60 mL/min (ref >60)

## 2021-02-12 LAB — RESP PANEL BY RT-PCR (FLU A&B, COVID) ARPGX2
Influenza A by PCR: NEGATIVE
Influenza B by PCR: NEGATIVE
SARS Coronavirus 2 by RT PCR: NEGATIVE

## 2021-02-12 LAB — AMMONIA: Ammonia: 43 umol/L — ABNORMAL HIGH (ref 9–35)

## 2021-02-12 MED ORDER — RIFAXIMIN 550 MG PO TABS
550.0000 mg | ORAL_TABLET | Freq: Two times a day (BID) | ORAL | Status: DC
  Administered 2021-02-12 – 2021-02-15 (×7): 550 mg via ORAL
  Filled 2021-02-12 (×7): qty 1

## 2021-02-12 MED ORDER — ONDANSETRON HCL 4 MG PO TABS: 4.0000 mg | ORAL_TABLET | Freq: Four times a day (QID) | ORAL | Status: DC | PRN

## 2021-02-12 MED ORDER — INSULIN ASPART 100 UNIT/ML IJ SOLN
0.0000 [IU] | Freq: Every day | INTRAMUSCULAR | Status: DC
  Administered 2021-02-12: 21:00:00 3 [IU] via SUBCUTANEOUS
  Administered 2021-02-13: 22:00:00 5 [IU] via SUBCUTANEOUS
  Administered 2021-02-14: 2 [IU] via SUBCUTANEOUS
  Filled 2021-02-12 (×4): qty 1

## 2021-02-12 MED ORDER — INSULIN ASPART 100 UNIT/ML IJ SOLN
0.0000 [IU] | Freq: Three times a day (TID) | INTRAMUSCULAR | Status: DC
  Administered 2021-02-12: 17:00:00 11 [IU] via SUBCUTANEOUS
  Administered 2021-02-12: 13:00:00 2 [IU] via SUBCUTANEOUS
  Administered 2021-02-12 – 2021-02-13 (×3): 3 [IU] via SUBCUTANEOUS
  Administered 2021-02-14: 17:00:00 11 [IU] via SUBCUTANEOUS
  Administered 2021-02-14: 2 [IU] via SUBCUTANEOUS
  Administered 2021-02-15: 8 [IU] via SUBCUTANEOUS
  Filled 2021-02-12 (×8): qty 1

## 2021-02-12 MED ORDER — BUTALBITAL-APAP-CAFFEINE 50-325-40 MG PO TABS
1.0000 | ORAL_TABLET | Freq: Once | ORAL | Status: AC | PRN
  Administered 2021-02-12: 11:00:00 1 via ORAL
  Filled 2021-02-12: qty 1

## 2021-02-12 MED ORDER — ONDANSETRON HCL 4 MG/2ML IJ SOLN: 4.0000 mg | Freq: Four times a day (QID) | INTRAMUSCULAR | Status: DC | PRN

## 2021-02-12 MED ORDER — ENOXAPARIN SODIUM 60 MG/0.6ML IJ SOSY
0.5000 mg/kg | PREFILLED_SYRINGE | INTRAMUSCULAR | Status: DC
  Administered 2021-02-12 – 2021-02-15 (×4): 47.5 mg via SUBCUTANEOUS
  Filled 2021-02-12 (×4): qty 0.6

## 2021-02-12 NOTE — ED Notes (Signed)
Pt states had urinary incontinence throughout the night. Pt placed into clean/dry brief. Purewick placed on patient at this time.

## 2021-02-12 NOTE — Progress Notes (Signed)
PT Cancellation Note  Patient Details Name: [name] MRN: [id number] DOB: [birth year]   Cancelled Treatment:    Reason Eval/Treat Not Completed: Patient declined, no reason specified Pt transferred from ED to floor earlier this AM.  Attempted to see her in new room, pt did not open eyes and stated she needed to nap more this morning (~9:20).  Will try back later per pt request.  Malachi Pro, DPT 02/12/2021, 9:29 AM

## 2021-02-12 NOTE — Evaluation (Signed)
Occupational Therapy Evaluation Patient Details Name: [name] MRN: [id number] DOB: [birth year] Today's Date: 02/12/2021   History of Present Illness Pt is a 45 y/o F admitted on 02/11/21 with c/c of fall & weakness.  PMH: bipolar disorder, DM2, alcoholic cirrhosis, FSH muscular dystrophy with frequent falls, recent prolonged hospitalization for aspiration PNA, sepsis & hepatic encephalopathy   Clinical Impression   Pt seen for OT evaluation (co-tx with PT). Pt reports that she was recently d/c from [facility] (2/2 to insurance coverage issues) to a motel room with a flight of stairs to enter and with no DME. Pt endorses 3 falls since d/c from STR. While at [facility], pt progressed to standing/walking with therapy and to performing bed-level LB dressing MOD-I (required increased time/effort). Prior to Oct 2022 hospital admission, pt was independent with ADLs and was using RW for functional mobility. This date, pt performed bed mobility MOD-I. Pt currently requires SUPERVISION/SET-UP for seated UB ADLs, SUPERVISION/SET-UP for donning socks while seated EOB, MOD A for sit<>stand transfers, and MIN A+2 to walk 81ft with RW due to current functional impairments (See OT Problem List below). Pt was motivated to participate in therapy this date and stated goal of being able to independently perform sit>stand LB ADLs and BSC transfers. Pt would benefit from additional skilled OT services to maximize return to PLOF and minimize risk of future falls, injury, caregiver burden, and readmission. Upon discharge, recommend SNF.         Recommendations for follow up therapy are one component of a multi-disciplinary discharge planning process, led by the attending physician.  Recommendations may be updated based on patient status, additional functional criteria and insurance authorization.   Follow Up Recommendations  Skilled nursing-short term rehab (<3 hours/day)    Assistance Recommended at Discharge Intermittent  Supervision/Assistance  Functional Status Assessment  Patient has had a recent decline in their functional status and demonstrates the ability to make significant improvements in function in a reasonable and predictable amount of time.  Equipment Recommendations  BSC/3in1 (with drop-arm)       Precautions / Restrictions Precautions Precautions: Fall Restrictions Weight Bearing Restrictions: No      Mobility Bed Mobility Overal bed mobility: Modified Independent       Supine to sit: Modified independent (Device/Increase time);HOB elevated     General bed mobility comments: With use of bedrails, requires no physical assist    Transfers Overall transfer level: Needs assistance Equipment used: Rolling walker (2 wheels) Transfers: Sit to/from Stand;Bed to chair/wheelchair/BSC Sit to Stand: Mod assist          Lateral/Scoot Transfers: Mod assist;+2 safety/equipment General transfer comment: Pt with min cuing for safe hand placement, once buttocks is off bed pt requires extra time to upright trunk & activate glutes/hip extensors      Balance Overall balance assessment: Needs assistance Sitting-balance support: Feet supported;Single extremity supported Sitting balance-Leahy Scale: Good Sitting balance - Comments: Good balance at EOB during seated grooming tasks   Standing balance support: Bilateral upper extremity supported;During functional activity;Reliant on assistive device for balance Standing balance-Leahy Scale: Poor Standing balance comment: Requires MIN A+2 and BUE support from RW to walk ~43ft                           ADL either performed or assessed with clinical judgement   ADL Overall ADL's : Needs assistance/impaired     Grooming: Brushing hair;Supervision/safety;Set up;Sitting Grooming Details (indicate cue type and reason):  While sitting EOB, requires no physical assist for seated grooming tasks         Upper Body Dressing : Sitting;Set  up;Supervision/safety Upper Body Dressing Details (indicate cue type and reason): to don hospital gown Lower Body Dressing: Supervision/safety;Set up;Sitting/lateral leans Lower Body Dressing Details (indicate cue type and reason): to don socks via figure 4 position             Functional mobility during ADLs: Minimal assistance;+2 for physical assistance;Rolling walker (2 wheels) (to walk ~5 ft with RW. +2 for physical assist and chair follow)       Vision Ability to See in Adequate Light: 0 Adequate Patient Visual Report: No change from baseline              Pertinent Vitals/Pain Pain Assessment: No/denies pain        Extremity/Trunk Assessment Upper Extremity Assessment Upper Extremity Assessment: RUE deficits/detail;LUE deficits/detail RUE Deficits / Details: Limited AROM of shoulder flexion (~90 degrees). Strength: shoulder flex 3+/5, elbow flex/ext 4/5, grip strength 5/5 RUE Coordination: decreased fine motor LUE Deficits / Details: Limited AROM of shoulder flexion (~90 degrees). Strength: shoulder flex 3+/5, elbow flex/ext 4/5, grip strength 5/5. LUE Sensation: decreased light touch LUE Coordination: decreased fine motor   Lower Extremity Assessment Lower Extremity Assessment: Generalized weakness;Defer to PT evaluation RLE Deficits / Details: 1/5 ankle dorsiflexion, 4-/5 knee extension, 3+/5 hip flexion in sitting LLE Deficits / Details: 1/5 ankle dorsiflexion, 4-/5 knee extension, 3+/5 hip flexion in sitting       Communication Communication Communication: No difficulties   Cognition Arousal/Alertness: Awake/alert Behavior During Therapy: WFL for tasks assessed/performed Overall Cognitive Status: Within Functional Limits for tasks assessed                                 General Comments: Pt motivated to participate in session                Home Living Family/patient expects to be discharged to:: Private residence Living Arrangements:  Non-relatives/Friends (cousin) Available Help at Discharge: Family;Available PRN/intermittently Type of Home: Other(Comment) (Motel room) Home Access: Stairs to enter [employer] of Steps: lives on second floor of a motel- 15-17 stairs with no elevator. Entrance Stairs-Rails: Can reach both Home Layout: One level               Home Equipment: Agricultural consultant (2 wheels)   Additional Comments: B AFOs      Prior Functioning/Environment               Mobility Comments: Pt recently discharged from SNF where she was working on standing/walking with PT but otherwise was w/c bound. Reports 3 falls since d/c from STR (had to d/c due to insurance issues). ADLs Comments: Pt recently discharged from SNF where she could dress lower body from bed level with increased time/effort. Requires physical assist for LB bathing        OT Problem List: Decreased strength;Decreased activity tolerance;Impaired balance (sitting and/or standing);Decreased safety awareness;Pain;Decreased knowledge of use of DME or AE      OT Treatment/Interventions: Self-care/ADL training;Therapeutic exercise;Therapeutic activities;Energy conservation;DME and/or AE instruction;Patient/family education;Balance training;Manual therapy    OT Goals(Current goals can be found in the care plan section) Acute Rehab OT Goals Patient Stated Goal: to use BSC OT Goal Formulation: With patient Time For Goal Achievement: 02/26/21 Potential to Achieve Goals: Good ADL Goals Pt Will Perform Lower Body Bathing: with  min assist;sit to/from stand Pt Will Perform Lower Body Dressing: with min assist;sit to/from stand;with adaptive equipment Pt Will Transfer to Toilet: with min assist;squat pivot transfer;bedside commode  OT Frequency: Min 2X/week   Barriers to D/C: Decreased caregiver support;Inaccessible home environment          Co-evaluation PT/OT/SLP Co-Evaluation/Treatment: Yes Reason for Co-Treatment:  Complexity of the patient's impairments (multi-system involvement);To address functional/ADL transfers PT goals addressed during session: Mobility/safety with mobility OT goals addressed during session: ADL's and self-care      AM-PAC OT "6 Clicks" Daily Activity     Outcome Measure Help from another person eating meals?: None Help from another person taking care of personal grooming?: A Little Help from another person toileting, which includes using toliet, bedpan, or urinal?: A Lot Help from another person bathing (including washing, rinsing, drying)?: A Lot Help from another person to put on and taking off regular upper body clothing?: A Little Help from another person to put on and taking off regular lower body clothing?: A Little 6 Click Score: 17   End of Session Equipment Utilized During Treatment: Gait belt;Rolling walker (2 wheels) Nurse Communication: Mobility status  Activity Tolerance: Patient tolerated treatment well Patient left: in chair;with call bell/phone within reach;with chair alarm set  OT Visit Diagnosis: Unsteadiness on feet (R26.81);Muscle weakness (generalized) (M62.81);History of falling (Z91.81)                Time: 3086-5784 OT Time Calculation (min): 25 min Charges:  OT General Charges $OT Visit: 1 Visit OT Evaluation $OT Eval Moderate Complexity: 1 Mod  Matthew Folks, OTR/L [facility] [phone]

## 2021-02-12 NOTE — Evaluation (Signed)
Physical Therapy Evaluation Patient Details Name: [name] MRN: [id number] DOB: [birth year] Today's Date: 02/12/2021  History of Present Illness  Pt is a 45 y/o F admitted on 02/11/21 with c/c of fall & weakness.  PMH: bipolar disorder, DM2, alcoholic cirrhosis, FSH muscular dystrophy with frequent falls, recent prolonged hospitalization for aspiration PNA, sepsis & hepatic encephalopathy  Clinical Impression  Pt seen for PT evaluation with co-tx with OT. Pt reports she was recently d/c from STR 2/2 insurance coverage issues & d/c home with flight of stairs to access apartment & without any DME. Pt reports 3 falls since d/c from STR. Overall during evaluation pt demonstrates improvement & increasing independence with functional tasks as compared to last time this PT saw her. Pt was able to complete supine>sit with mod I & sit<>stand transfers with mod assist +2 for safety. Pt even progress to ambulating ~5 ft with RW & +2 assist! Pt is very motivated to increase independence with mobility & ambulate again. Will continue to follow pt acutely to progress gait & strengthening as able. Would recommend pt d/c to STR again to maximize independence with functional mobility & reduce fall risk prior to return home.        Recommendations for follow up therapy are one component of a multi-disciplinary discharge planning process, led by the attending physician.  Recommendations may be updated based on patient status, additional functional criteria and insurance authorization.  Follow Up Recommendations Skilled nursing-short term rehab (<3 hours/day)    Assistance Recommended at Discharge Frequent or constant Supervision/Assistance  Functional Status Assessment Patient has had a recent decline in their functional status and demonstrates the ability to make significant improvements in function in a reasonable and predictable amount of time.  Equipment Recommendations  Rolling walker (2  wheels);BSC/3in1;Wheelchair (measurements PT);Wheelchair cushion (measurements PT)    Recommendations for Other Services       Precautions / Restrictions Precautions Precautions: Fall Restrictions Weight Bearing Restrictions: No      Mobility  Bed Mobility Overal bed mobility: Needs Assistance       Supine to sit: Modified independent (Device/Increase time);HOB elevated (use of bed rails)          Transfers Overall transfer level: Needs assistance Equipment used: Rolling walker (2 wheels) Transfers: Sit to/from Stand;Bed to chair/wheelchair/BSC Sit to Stand: Mod assist (2nd person present for safety)          Lateral/Scoot Transfers: Mod assist;+2 safety/equipment General transfer comment: Pt with min cuing for safe hand placement, once buttocks is off bed pt requires extra time to upright trunk & activate glutes/hip extensors    Ambulation/Gait Ambulation/Gait assistance: Mod assist;+2 physical assistance Gait Distance (Feet): 5 Feet Assistive device: Rolling walker (2 wheels) Gait Pattern/deviations: Decreased step length - right;Decreased step length - left;Decreased stride length;Decreased dorsiflexion - right;Decreased dorsiflexion - left Gait velocity: decreased     General Gait Details: Slightly excessive hip flexion to allow BLE foot clearance 2/2 hx of food drop & pt not having AFOs present.  Stairs            Wheelchair Mobility    Modified Rankin (Stroke Patients Only)       Balance Overall balance assessment: Needs assistance Sitting-balance support: Feet supported;Single extremity supported Sitting balance-Leahy Scale: Good     Standing balance support: Bilateral upper extremity supported;During functional activity;Reliant on assistive device for balance Standing balance-Leahy Scale: Poor Standing balance comment: BUE support on RW  Pertinent Vitals/Pain Pain Assessment: No/denies pain     Home Living Family/patient expects to be discharged to:: Private residence Living Arrangements: Non-relatives/Friends (cousin) Available Help at Discharge: Family;Available PRN/intermittently Type of Home: Apartment Home Access: Stairs to enter Entrance Stairs-Rails: Can reach both Entrance Stairs-Number of Steps: lives on second floor apartment - 15-17 stairs with no elevator.   Home Layout: One level Home Equipment: Agricultural consultant (2 wheels) Additional Comments: B AFOs    Prior Function               Mobility Comments: Pt recently discharged from SNF where she was working on standing/walking with PT but otherwise was w/c bound. Reports 3 falls since d/c from STR (had to d/c due to insurance issues). ADLs Comments: could dress lower body from bed level with some difficulty getting clothing over hips     Hand Dominance        Extremity/Trunk Assessment   Upper Extremity Assessment Upper Extremity Assessment: Defer to OT evaluation RUE Deficits / Details: ~90 degrees BUE shoulder flexion    Lower Extremity Assessment Lower Extremity Assessment: RLE deficits/detail;LLE deficits/detail (BLE sensation & proprioception intact) RLE Deficits / Details: 1/5 ankle dorsiflexion, 4-/5 knee extension, 3+/5 hip flexion in sitting LLE Deficits / Details: 1/5 ankle dorsiflexion, 4-/5 knee extension, 3+/5 hip flexion in sitting       Communication   Communication: No difficulties  Cognition Arousal/Alertness: Awake/alert Behavior During Therapy: WFL for tasks assessed/performed Overall Cognitive Status: Within Functional Limits for tasks assessed                                 General Comments: Pt reports she recognizes importance of OOB mobility daily.        General Comments      Exercises Other Exercises Other Exercises: PT educated pt on BLE gastroc stretching with use of sheet with pt return demonstrating; educated her on need to hold stretch 30  seconds 3x on each BLE & pt voiced understanding.   Assessment/Plan    PT Assessment Patient needs continued PT services  PT Problem List Decreased strength;Decreased mobility;Decreased safety awareness;Decreased range of motion;Decreased activity tolerance;Cardiopulmonary status limiting activity;Decreased balance       PT Treatment Interventions DME instruction;Therapeutic activities;Gait training;Therapeutic exercise;Patient/family education;Stair training;Balance training;Functional mobility training;Neuromuscular re-education;Modalities    PT Goals (Current goals can be found in the Care Plan section)  Acute Rehab PT Goals Patient Stated Goal: get better, walk again PT Goal Formulation: With patient Time For Goal Achievement: 02/26/21 Potential to Achieve Goals: Fair    Frequency Min 2X/week   Barriers to discharge Inaccessible home environment lives on 2nd floor apartment with flight of stairs to access without elevator option    Co-evaluation PT/OT/SLP Co-Evaluation/Treatment: Yes Reason for Co-Treatment: For patient/therapist safety;To address functional/ADL transfers PT goals addressed during session: Mobility/safety with mobility;Balance;Proper use of DME;Strengthening/ROM         AM-PAC PT "6 Clicks" Mobility  Outcome Measure Help needed turning from your back to your side while in a flat bed without using bedrails?: None Help needed moving from lying on your back to sitting on the side of a flat bed without using bedrails?: A Little Help needed moving to and from a bed to a chair (including a wheelchair)?: A Lot Help needed standing up from a chair using your arms (e.g., wheelchair or bedside chair)?: A Lot Help needed to walk in hospital room?: A Lot  Help needed climbing 3-5 steps with a railing? : Total 6 Click Score: 14    End of Session Equipment Utilized During Treatment: Gait belt Activity Tolerance: Patient tolerated treatment well Patient left: in  chair;with call bell/phone within reach;with family/visitor present Nurse Communication: Mobility status PT Visit Diagnosis: Muscle weakness (generalized) (M62.81);Difficulty in walking, not elsewhere classified (R26.2);Unsteadiness on feet (R26.81);Other abnormalities of gait and mobility (R26.89)    Time: 9528-4132 PT Time Calculation (min) (ACUTE ONLY): 26 min   Charges:   PT Evaluation $PT Eval Moderate Complexity: 1 Mod          Aleda Grana, PT, DPT 02/12/21, 2:18 PM   Sandi Mariscal 02/12/2021, 2:16 PM

## 2021-02-12 NOTE — Progress Notes (Signed)
Anticoagulation monitoring(Lovenox):  45 yo female ordered Lovenox 40 mg Q24h    Filed Weights   02/11/21 1134  Weight: 92.5 kg (204 lb)   BMI 31.95    Lab Results  Component Value Date   CREATININE 0.65 02/11/2021   CREATININE 0.70 02/01/2021   CREATININE 0.61 01/26/2021   Estimated Creatinine Clearance: 104.8 mL/min (by C-G formula based on SCr of 0.65 mg/dL). Hemoglobin & Hematocrit     Component Value Date/Time   HGB 10.7 (L) 02/11/2021 1142   HCT 34.0 (L) 02/11/2021 1142     Per Protocol for Patient with estCrcl > 30 ml/min and BMI > 30, will transition to Lovenox 47.5 mg Q24h.

## 2021-02-12 NOTE — ED Notes (Signed)
Pt in bed, pt arouses easily to verbal stim, pt denies pain, pt has no complaints, pt placed on 2L O2 via [place] secondary to low O2 sat when sleeping, pt 89% when resting.

## 2021-02-12 NOTE — ED Notes (Signed)
ED TO INPATIENT HANDOFF REPORT  ED Nurse Name and Phone #: Feliz Beam [phone]  S Name/Age/Gender [name] 45 y.o. female Room/Bed: ED49A/ED49A  Code Status   Code Status: Full Code  Home/SNF/Other Home Patient oriented to: self, place, and time Is this baseline? Yes   Triage Complete: Triage complete  Chief Complaint Frequent falls [R29.6]  Triage Note Pt via EMS from home. Pt was recently d/c from a rehab facility yesterday. Pt has been having multiple falls since discharge. States she has no strength in her legs. She feels like she was d/c too soon.   Pt has a hx of muscular dystrophy    Allergies Allergies  Allergen Reactions   Erythromycin Anaphylaxis   Sulfa Antibiotics Anaphylaxis   Tylenol [Acetaminophen] Other (See Comments)    Pancreatitis and liver dysfunction, not supposed to take med   Nsaids Other (See Comments)    Causes GI problems, very bad reaction-per patient.    Gabapentin     Mood changes     Level of Care/Admitting Diagnosis ED Disposition     ED Disposition  Admit   Condition  --   Comment  Hospital Area: [facility] [[id number]]  Level of Care: Med-Surg [16]  Covid Evaluation: Confirmed COVID Negative  Diagnosis: Frequent falls [697756]  Admitting Physician: Andris Baumann [[id number]]  Attending Physician: Andris Baumann [[id number]]  Estimated length of stay: past midnight tomorrow  Certification:: I certify this patient will need inpatient services for at least 2 midnights          B Medical/Surgery History Past Medical History:  Diagnosis Date   Alcohol abuse    Anxiety    Anxiety    Anxiety and depression    Asthma    Bipolar disorder ([name])    Borderline personality disorder ([name])    Depression    Diabetes ([name])    Type 2    Dyslipidemia    Esophageal varices ([name])    Foot drop    right   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[name])    GERD (gastroesophageal reflux disease)    Gout     History of drug abuse ([name])    Insomnia    Muscular dystrophies ([name])    Neuropathy    Pancreatitis    Recovering alcoholic ([name])    Past Surgical History:  Procedure Laterality Date   HERNIA REPAIR     IR FLUORO GUIDE CV LINE RIGHT  12/30/2020   IR REMOVAL TUN CV CATH W/O FL  01/19/2021   IR US GUIDE VASC ACCESS RIGHT  12/30/2020     A IV Location/Drains/Wounds Patient Lines/Drains/Airways Status     Active Line/Drains/Airways     Name Placement date Placement time Site Days   Peripheral IV 02/12/21 20 G Left;Posterior Hand 02/12/21  0219  Hand  less than 1   External Urinary Catheter 12/18/20  0500  --  56   Wound / Incision (Open or Dehisced) (MASD) Moisture Associated Skin Damage Pelvis Anterior;Right;Left reddness with top layer of skin broken --  --  Pelvis  --            Intake/Output Last 24 hours No intake or output data in the 24 hours ending 02/12/21 0743  Labs/Imaging Results for orders placed or performed during the hospital encounter of 02/11/21 (from the past 48 hour(s))  Basic metabolic panel     Status: Abnormal   Collection Time: 02/11/21 11:42 AM  Result Value Ref Range  Sodium 137 135 - 145 mmol/L   Potassium 3.3 (L) 3.5 - 5.1 mmol/L   Chloride 100 98 - 111 mmol/L   CO2 29 22 - 32 mmol/L   Glucose, Bld 165 (H) 70 - 99 mg/dL    Comment: Glucose reference range applies only to samples taken after fasting for at least 8 hours.   BUN 15 6 - 20 mg/dL   Creatinine, Ser 9.76 0.44 - 1.00 mg/dL   Calcium 8.8 (L) 8.9 - 10.3 mg/dL   GFR, Estimated >73 >41 mL/min    Comment: (NOTE) Calculated using the CKD-EPI Creatinine Equation (2021)    Anion gap 8 5 - 15    Comment: Performed at [facility], [address] [place], [place] [address]  CBC     Status: Abnormal   Collection Time: 02/11/21 11:42 AM  Result Value Ref Range   WBC 16.1 (H) 4.0 - 10.5 K/uL   RBC 3.19 (L) 3.87 - 5.11 MIL/uL   Hemoglobin 10.7 (L) 12.0 - 15.0 g/dL   HCT 24.0  (L) 97.3 - 46.0 %   MCV 106.6 (H) 80.0 - 100.0 fL   MCH 33.5 26.0 - 34.0 pg   MCHC 31.5 30.0 - 36.0 g/dL   RDW 53.2 99.2 - 42.6 %   Platelets 241 150 - 400 K/uL   nRBC 0.0 0.0 - 0.2 %    Comment: Performed at [facility], [address] [place], [place] [address]  Hepatic function panel     Status: Abnormal   Collection Time: 02/11/21 11:42 AM  Result Value Ref Range   Total Protein 8.0 6.5 - 8.1 g/dL   Albumin 2.7 (L) 3.5 - 5.0 g/dL   AST 87 (H) 15 - 41 U/L   ALT 19 0 - 44 U/L   Alkaline Phosphatase 274 (H) 38 - 126 U/L   Total Bilirubin 2.2 (H) 0.3 - 1.2 mg/dL   Bilirubin, Direct 0.9 (H) 0.0 - 0.2 mg/dL   Indirect Bilirubin 1.3 (H) 0.3 - 0.9 mg/dL    Comment: Performed at [facility], [address] [place], [place] [address]  CK     Status: None   Collection Time: 02/11/21  4:40 PM  Result Value Ref Range   Total CK 62 38 - 234 U/L    Comment: Performed at [facility], [address] [place], [place] [address]  Resp Panel by RT-PCR (Flu A&B, Covid) Nasopharyngeal Swab     Status: None   Collection Time: 02/12/21 12:44 AM   Specimen: Nasopharyngeal Swab; Nasopharyngeal(NP) swabs in vial transport medium  Result Value Ref Range   SARS Coronavirus 2 by RT PCR NEGATIVE NEGATIVE    Comment: (NOTE) SARS-CoV-2 target nucleic acids are NOT DETECTED.  The SARS-CoV-2 RNA is generally detectable in upper respiratory specimens during the acute phase of infection. The lowest concentration of SARS-CoV-2 viral copies this assay can detect is 138 copies/mL. A negative result does not preclude SARS-Cov-2 infection and should not be used as the sole basis for treatment or other patient management decisions. A negative result may occur with  improper specimen collection/handling, submission of specimen other than nasopharyngeal swab, presence of viral mutation(s) within the areas targeted by this assay, and inadequate number of viral copies(<138  copies/mL). A negative result must be combined with clinical observations, patient history, and epidemiological information. The expected result is Negative.  Fact Sheet for Patients:  BloggerCourse.com  Fact Sheet for Healthcare Providers:  SeriousBroker.it  This test  is no t yet approved or cleared by the [place] and  has been authorized for detection and/or diagnosis of SARS-CoV-2 by FDA under an Emergency Use Authorization (EUA). This EUA will remain  in effect (meaning this test can be used) for the duration of the COVID-19 declaration under Section 564(b)(1) of the Act, 21 U.S.C.section 360bbb-3(b)(1), unless the authorization is terminated  or revoked sooner.       Influenza A by PCR NEGATIVE NEGATIVE   Influenza B by PCR NEGATIVE NEGATIVE    Comment: (NOTE) The Xpert Xpress SARS-CoV-2/FLU/RSV plus assay is intended as an aid in the diagnosis of influenza from Nasopharyngeal swab specimens and should not be used as a sole basis for treatment. Nasal washings and aspirates are unacceptable for Xpert Xpress SARS-CoV-2/FLU/RSV testing.  Fact Sheet for Patients: BloggerCourse.com  Fact Sheet for Healthcare Providers: SeriousBroker.it  This test is not yet approved or cleared by the [place] FDA and has been authorized for detection and/or diagnosis of SARS-CoV-2 by FDA under an Emergency Use Authorization (EUA). This EUA will remain in effect (meaning this test can be used) for the duration of the COVID-19 declaration under Section 564(b)(1) of the Act, 21 U.S.C. section 360bbb-3(b)(1), unless the authorization is terminated or revoked.  Performed at [facility], [address] [place], [place] [address]   CBG monitoring, ED     Status: Abnormal   Collection Time: 02/12/21  2:17 AM  Result Value Ref Range   Glucose-Capillary 191 (H) 70 - 99  mg/dL    Comment: Glucose reference range applies only to samples taken after fasting for at least 8 hours.  CBC     Status: Abnormal   Collection Time: 02/12/21  2:18 AM  Result Value Ref Range   WBC 14.4 (H) 4.0 - 10.5 K/uL   RBC 2.93 (L) 3.87 - 5.11 MIL/uL   Hemoglobin 9.4 (L) 12.0 - 15.0 g/dL   HCT 60.4 (L) 54.0 - 98.1 %   MCV 104.8 (H) 80.0 - 100.0 fL   MCH 32.1 26.0 - 34.0 pg   MCHC 30.6 30.0 - 36.0 g/dL   RDW 19.1 47.8 - 29.5 %   Platelets 221 150 - 400 K/uL   nRBC 0.0 0.0 - 0.2 %    Comment: Performed at [facility], [address] [place], [place] [address]  Creatinine, serum     Status: None   Collection Time: 02/12/21  2:18 AM  Result Value Ref Range   Creatinine, Ser 0.64 0.44 - 1.00 mg/dL   GFR, Estimated >86 >57 mL/min    Comment: (NOTE) Calculated using the CKD-EPI Creatinine Equation (2021) Performed at [facility], [address] [place], [place] [address]   Ammonia     Status: Abnormal   Collection Time: 02/12/21  4:44 AM  Result Value Ref Range   Ammonia 43 (H) 9 - 35 umol/L    Comment: Performed at [facility], [address] [place], [place] [address]   DG Wrist Complete Right  Result Date: 02/11/2021 CLINICAL DATA:  Trauma, fall EXAM: RIGHT WRIST - COMPLETE 3+ VIEW COMPARISON:  None. FINDINGS: No displaced fracture or dislocation is seen. There is 1 mm smooth marginated calcification adjacent to the distal radius, possibly an artifact or old tiny avulsion IMPRESSION: No recent fracture or dislocation is seen. There is 1 mm smooth marginated calcification adjacent to the distal radius may be residual from previous injury. Electronically Signed   By: Ernie Avena  M.D.   On: 02/11/2021 16:58   DG Tibia/Fibula Right  Result Date: 02/11/2021 CLINICAL DATA:  Trauma, fall EXAM: RIGHT TIBIA AND FIBULA - 2 VIEW COMPARISON:  None. FINDINGS: There is no evidence of fracture or other focal bone lesions. There are scattered  soft tissue calcifications. IMPRESSION: No fracture or dislocation is seen in the right tibia and fibula. Electronically Signed   By: Ernie Avena M.D.   On: 02/11/2021 16:56   DG Knee Complete 4 Views Right  Result Date: 02/11/2021 CLINICAL DATA:  Trauma, fall EXAM: RIGHT KNEE - COMPLETE 4+ VIEW COMPARISON:  None. FINDINGS: No fracture or dislocation is seen. There is 2 mm smooth marginated calcification in the soft tissues adjacent to the inferior margin of patella, possibly soft tissue calcification from previous injury. IMPRESSION: No recent fracture or dislocation is seen in the right knee. Electronically Signed   By: Ernie Avena M.D.   On: 02/11/2021 16:57    Pending Labs Unresulted Labs (From admission, onward)     Start     Ordered   02/19/21 0500  Creatinine, serum  (enoxaparin (LOVENOX)    CrCl >/= 30 ml/min)  Weekly,   STAT     Comments: while on enoxaparin therapy    02/12/21 0059   02/11/21 1845  Urinalysis, Routine w reflex microscopic  Once,   STAT        02/11/21 1845            Vitals/Pain Today's Vitals   02/12/21 0459 02/12/21 0525 02/12/21 0614 02/12/21 0741  BP: 120/86   134/84  Pulse: 96   96  Resp: 16   16  Temp:    98.5 F (36.9 C)  TempSrc:    Oral  SpO2: 94%   97%  Weight:      Height:      PainSc:  Asleep Asleep 0-No pain    Isolation Precautions No active isolations  Medications Medications  atorvastatin (LIPITOR) tablet 40 mg (40 mg Oral Not Given 02/11/21 2109)  budesonide (ENTOCORT EC) 24 hr capsule 9 mg (9 mg Oral Not Given 02/11/21 2109)  calcium-vitamin D (OSCAL WITH D) 500-5 MG-MCG per tablet 1 tablet (has no administration in time range)  dicyclomine (BENTYL) tablet 20 mg (20 mg Oral Not Given 02/11/21 2110)  diphenoxylate-atropine (LOMOTIL) 2.5-0.025 MG per tablet 1 tablet (has no administration in time range)  pantoprazole (PROTONIX) EC tablet 40 mg (40 mg Oral Patient Refused/Not Given 02/11/21 2112)  FLUoxetine  (PROZAC) capsule 80 mg (80 mg Oral Not Given 02/11/21 2110)  fluticasone (FLONASE) 50 MCG/ACT nasal spray 1 spray (1 spray Each Nare Not Given 02/11/21 2110)  folic acid (FOLVITE) tablet 1 mg (1 mg Oral Not Given 02/11/21 2110)  furosemide (LASIX) tablet 40 mg (40 mg Oral Not Given 02/11/21 2109)  insulin aspart (novoLOG) injection 12 Units (has no administration in time range)  insulin glargine-yfgn (SEMGLEE) injection 28 Units (28 Units Subcutaneous Not Given 02/12/21 0135)  lactulose (CHRONULAC) 10 GM/15ML solution 20 g (has no administration in time range)  magnesium oxide (MAG-OX) tablet 400 mg (400 mg Oral Not Given 02/11/21 2111)  methocarbamol (ROBAXIN) tablet 500 mg (500 mg Oral Given 02/11/21 2136)  multivitamin with minerals tablet 1 tablet (1 tablet Oral Not Given 02/11/21 2111)  OLANZapine (ZYPREXA) tablet 5 mg (5 mg Oral Given 02/11/21 2136)  pantoprazole (PROTONIX) EC tablet 40 mg (40 mg Oral Not Given 02/11/21 2111)  pregabalin (LYRICA) capsule 75-150 mg (150 mg Oral Given  02/11/21 2134)  promethazine (PHENERGAN) tablet 25 mg (has no administration in time range)  spironolactone (ALDACTONE) tablet 100 mg (100 mg Oral Patient Refused/Not Given 02/11/21 2112)  thiamine tablet 100 mg (100 mg Oral Patient Refused/Not Given 02/11/21 2112)  albuterol (PROVENTIL) (2.5 MG/3ML) 0.083% nebulizer solution 2.5 mg (has no administration in time range)  rifaximin (XIFAXAN) tablet 550 mg (has no administration in time range)  insulin aspart (novoLOG) injection 0-15 Units (has no administration in time range)  insulin aspart (novoLOG) injection 0-5 Units (0 Units Subcutaneous Not Given 02/12/21 0230)  enoxaparin (LOVENOX) injection 47.5 mg (47.5 mg Subcutaneous Not Given 02/12/21 0134)  ondansetron (ZOFRAN) tablet 4 mg (has no administration in time range)    Or  ondansetron (ZOFRAN) injection 4 mg (has no administration in time range)    Mobility walks High fall risk        R Recommendations: See Admitting Provider Note  Report given to:   Additional Notes:

## 2021-02-12 NOTE — H&P (Addendum)
History and Physical    [name] FXJ:[id number] DOB: 10-May-1975 DOA: 02/11/2021  PCP: Janine Limbo, PA-C   Patient coming from: home  I have personally briefly reviewed patient's relevant medical records in [place]  Chief Complaint: falls  HPI: [name] is a 45 y.o. 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  She denies other illness and denies cough, shortness of breath, nausea, vomiting, abdominal pain or diarrhea.  Denies chest pain.  ED course: BP 150/85 and pulse 101 with otherwise normal vitals Blood work: AST/ALT 87/19 with alk phos 274, total bili 2.3 but improved from last hospital stay WBC 16,000, down from 34,000 a couple weeks prior deemed secondary to leukemoid reaction for which she was referred to hematology Hemoglobin 10.7 Total CK 62  Imaging: X-ray right wrist, right knee and right tib-fib with no acute injury  EKG, personally viewed and interpreted: NSR at 87 with no acute ST-T wave changes  Hospitalist consulted for admission due to no safe discharge options.    Review of Systems: As per HPI otherwise all other systems on review of systems negative.    Past Medical History:  Diagnosis Date   Alcohol abuse    Anxiety    Anxiety    Anxiety and depression    Asthma    Bipolar disorder ([place])    Borderline personality disorder ([place])    Depression    Diabetes ([place])    Type 2    Dyslipidemia    Esophageal varices ([name])    Foot drop    right    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[name])    GERD (gastroesophageal reflux disease)    Gout    History of drug abuse ([place])    Insomnia    Muscular dystrophies ([place])    Neuropathy    Pancreatitis    Recovering alcoholic ([place])     Past Surgical History:  Procedure Laterality Date   HERNIA REPAIR     IR FLUORO GUIDE CV LINE RIGHT  12/30/2020   IR REMOVAL TUN CV CATH W/O FL  01/19/2021   IR US GUIDE VASC ACCESS RIGHT  12/30/2020     reports that she has been smoking. She has been smoking an average of .5 packs per day. She has never used smokeless tobacco. She reports current alcohol use. She reports current drug use. Drugs: IV and Cocaine.  Allergies  Allergen Reactions   Erythromycin Anaphylaxis   Sulfa Antibiotics Anaphylaxis   Tylenol [Acetaminophen] Other (See Comments)    Pancreatitis and liver dysfunction, not supposed to take med   Nsaids Other (See Comments)    Causes GI problems, very bad reaction-per patient.    Gabapentin     Mood changes     Family History  Problem Relation Age of Onset   Heart disease Mother    Heart attack Mother    Hyperlipidemia Maternal Grandmother    Diabetes Maternal Grandmother    Hypertension Maternal Grandmother    Hyperlipidemia Maternal Grandfather    Heart  disease Maternal Grandfather    Heart attack Maternal Grandfather    Diabetes Maternal Grandfather    Lung cancer Maternal Grandfather    Hypertension Maternal Grandfather    Other Paternal Grandmother        Thyroid problems    Heart disease Paternal Grandfather    Stroke Paternal Grandfather    Hyperlipidemia Paternal Grandfather    Heart attack Paternal Grandfather    Hypertension Paternal Grandfather       Prior to Admission medications   Medication Sig Start Date End Date Taking? Authorizing Provider  albuterol (VENTOLIN HFA) 108 (90 Base) MCG/ACT inhaler Inhale 2 puffs into the lungs every 6 (six) hours as needed for wheezing. 09/15/18   Clapacs, Madie Reno,  MD  atorvastatin (LIPITOR) 40 MG tablet Take 1 tablet (40 mg total) by mouth daily. 09/15/18   Clapacs, Madie Reno, MD  budesonide (ENTOCORT EC) 3 MG 24 hr capsule Take 9 mg by mouth daily. 12/09/20   [provider]  calcium-vitamin D (OSCAL WITH D) 500-200 MG-UNIT tablet Take 1 tablet by mouth daily with breakfast. 09/16/18   Clapacs, Madie Reno, MD  dicyclomine (BENTYL) 20 MG tablet Take 20 mg by mouth 3 (three) times daily. 07/02/20   [provider]  diphenoxylate-atropine (LOMOTIL) 2.5-0.025 MG tablet Take 1 tablet by mouth in the morning, at noon, in the evening, and at bedtime. 07/07/20   [provider]  esomeprazole (NEXIUM) 40 MG capsule Take 40 mg by mouth daily. 09/30/20   [provider]  FLUoxetine (PROZAC) 40 MG capsule Take 2 capsules (80 mg total) by mouth daily. 09/15/18   Clapacs, Madie Reno, MD  fluticasone (FLONASE) 50 MCG/ACT nasal spray Place 1 spray into both nostrils daily. 09/16/18   Clapacs, Madie Reno, MD  folic acid (FOLVITE) 1 MG tablet Take 1 tablet (1 mg total) by mouth daily. 01/27/21   Wouk, Ailene Rud, MD  furosemide (LASIX) 40 MG tablet Take 1 tablet (40 mg total) by mouth daily. 01/27/21   Wouk, Ailene Rud, MD  insulin aspart (NOVOLOG) 100 UNIT/ML injection Inject 12 Units into the skin 3 (three) times daily with meals. 01/26/21   Wouk, Ailene Rud, MD  insulin glargine-yfgn (SEMGLEE) 100 UNIT/ML injection Inject 0.28 mLs (28 Units total) into the skin 2 (two) times daily. 01/26/21   Wouk, Ailene Rud, MD  lactulose (CHRONULAC) 10 GM/15ML solution Take 30 mLs (20 g total) by mouth 2 (two) times daily as needed for mild constipation. 01/26/21   Wouk, Ailene Rud, MD  magnesium oxide (MAG-OX) 400 (240 Mg) MG tablet Take 1 tablet (400 mg total) by mouth 2 (two) times daily. 01/26/21   Wouk, Ailene Rud, MD  methocarbamol (ROBAXIN) 500 MG tablet Take 500 mg by mouth 2 (two) times daily as needed. 12/01/20   [provider]  Multiple  Vitamin (MULTIVITAMIN WITH MINERALS) TABS tablet Take 1 tablet by mouth daily. 09/16/18   Clapacs, Madie Reno, MD  naloxone [facility]) nasal spray 4 mg/0.1 mL Take as needed for opioid overdose 01/22/19   Duffy Bruce, MD  OLANZapine (ZYPREXA) 5 MG tablet Take 1 tablet (5 mg total) by mouth at bedtime. 01/26/21   Wouk, Ailene Rud, MD  pantoprazole (PROTONIX) 40 MG tablet Take 1 tablet (40 mg total) by mouth daily. 09/16/18   Clapacs, Madie Reno, MD  pregabalin (LYRICA) 75 MG capsule Take 75-150 mg by mouth 3 (three) times daily. 11/16/20   [provider]  promethazine (PHENERGAN) 25 MG tablet Take 25  mg by mouth every 6 (six) hours as needed for nausea/vomiting. 12/01/20   [provider]  rifaximin (XIFAXAN) 550 MG TABS tablet Take 1 tablet (550 mg total) by mouth 2 (two) times daily. 01/26/21   Wouk, Ailene Rud, MD  spironolactone (ALDACTONE) 100 MG tablet Take 1 tablet (100 mg total) by mouth daily. 01/27/21   Wouk, Ailene Rud, MD  thiamine 100 MG tablet Take 1 tablet (100 mg total) by mouth daily. 01/27/21   Wouk, Ailene Rud, MD    Physical Exam: Vitals:   02/11/21 1940 02/11/21 1941 02/11/21 2108 02/12/21 0044  BP: 121/64 121/64 116/62 123/64  Pulse:  (!) 101  96  Resp:  20  16  Temp:      TempSrc:      SpO2:  93% 94% 91%  Weight:      Height:       Constitutional: Somnolent but arousable and oriented x 3 . Not in any apparent distress HEENT:      Head: Normocephalic and atraumatic.         Eyes: PERLA, EOMI, Conjunctivae are normal. Sclera is non-icteric.       Mouth/Throat: Mucous membranes are moist.       Neck: Supple with no signs of meningismus. Cardiovascular: Regular rate and rhythm. No murmurs, gallops, or rubs. 2+ symmetrical distal pulses are present . No JVD. No  LE edema Respiratory: Respiratory effort normal .Lungs sounds clear bilaterally. No wheezes, crackles, or rhonchi.  Gastrointestinal: Soft, non tender, mildly distended. Positive bowel sounds.   Genitourinary: No CVA tenderness. Musculoskeletal: Nontender with normal range of motion in all extremities. No cyanosis, or erythema of extremities. Neurologic:  Face is symmetric. Moving all extremities. No gross focal neurologic deficits . Skin: Skin is warm, dry.  No rash or ulcers Psychiatric: Mood and affect are appropriate    Labs on Admission: I have personally reviewed following labs and imaging studies  CBC: Recent Labs  Lab 02/11/21 1142  WBC 16.1*  HGB 10.7*  HCT 34.0*  MCV 106.6*  PLT 712   Basic Metabolic Panel: Recent Labs  Lab 02/11/21 1142  NA 137  K 3.3*  CL 100  CO2 29  GLUCOSE 165*  BUN 15  CREATININE 0.65  CALCIUM 8.8*   GFR: Estimated Creatinine Clearance: 104.8 mL/min (by C-G formula based on SCr of 0.65 mg/dL). Liver Function Tests: Recent Labs  Lab 02/11/21 1142  AST 87*  ALT 19  ALKPHOS 274*  BILITOT 2.2*  PROT 8.0  ALBUMIN 2.7*   No results for input(s): LIPASE, AMYLASE in the last 168 hours. No results for input(s): AMMONIA in the last 168 hours. Coagulation Profile: No results for input(s): INR, PROTIME in the last 168 hours. Cardiac Enzymes: Recent Labs  Lab 02/11/21 1640  CKTOTAL 62   BNP (last 3 results) No results for input(s): PROBNP in the last 8760 hours. HbA1C: No results for input(s): HGBA1C in the last 72 hours. CBG: No results for input(s): GLUCAP in the last 168 hours. Lipid Profile: No results for input(s): CHOL, HDL, LDLCALC, TRIG, CHOLHDL, LDLDIRECT in the last 72 hours. Thyroid Function Tests: No results for input(s): TSH, T4TOTAL, FREET4, T3FREE, THYROIDAB in the last 72 hours. Anemia Panel: No results for input(s): VITAMINB12, FOLATE, FERRITIN, TIBC, IRON, RETICCTPCT in the last 72 hours. Urine analysis:    Component Value Date/Time   COLORURINE AMBER (A) 01/09/2021 0530   APPEARANCEUR CLOUDY (A) 01/09/2021 0530   LABSPEC 1.012 01/09/2021 0530   PHURINE  6.0 01/09/2021 0530   GLUCOSEU NEGATIVE  01/09/2021 0530   HGBUR SMALL (A) 01/09/2021 0530   BILIRUBINUR MODERATE (A) 01/09/2021 0530   KETONESUR NEGATIVE 01/09/2021 0530   PROTEINUR 30 (A) 01/09/2021 0530   UROBILINOGEN 0.2 11/18/2014 1520   NITRITE NEGATIVE 01/09/2021 0530   LEUKOCYTESUR LARGE (A) 01/09/2021 0530    Radiological Exams on Admission: DG Wrist Complete Right  Result Date: 02/11/2021 CLINICAL DATA:  Trauma, fall EXAM: RIGHT WRIST - COMPLETE 3+ VIEW COMPARISON:  None. FINDINGS: No displaced fracture or dislocation is seen. There is 1 mm smooth marginated calcification adjacent to the distal radius, possibly an artifact or old tiny avulsion IMPRESSION: No recent fracture or dislocation is seen. There is 1 mm smooth marginated calcification adjacent to the distal radius may be residual from previous injury. Electronically Signed   By: Elmer Picker M.D.   On: 02/11/2021 16:58   DG Tibia/Fibula Right  Result Date: 02/11/2021 CLINICAL DATA:  Trauma, fall EXAM: RIGHT TIBIA AND FIBULA - 2 VIEW COMPARISON:  None. FINDINGS: There is no evidence of fracture or other focal bone lesions. There are scattered soft tissue calcifications. IMPRESSION: No fracture or dislocation is seen in the right tibia and fibula. Electronically Signed   By: Elmer Picker M.D.   On: 02/11/2021 16:56   DG Knee Complete 4 Views Right  Result Date: 02/11/2021 CLINICAL DATA:  Trauma, fall EXAM: RIGHT KNEE - COMPLETE 4+ VIEW COMPARISON:  None. FINDINGS: No fracture or dislocation is seen. There is 2 mm smooth marginated calcification in the soft tissues adjacent to the inferior margin of patella, possibly soft tissue calcification from previous injury. IMPRESSION: No recent fracture or dislocation is seen in the right knee. Electronically Signed   By: Elmer Picker M.D.   On: 02/11/2021 16:57    Assessment/Plan Principal Problem:   Frequent falls Active Problems:   Leukocytosis   Diabetes ([name])   Alcoholic liver disease ([name])    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[place])   Bipolar 2 disorder ([place])   45 year old female with history of bipolar disorder, diabetes, alcohol use disorder and polysubstance abuse, FSH muscular dystrophy, presenting with frequent falls   FSH muscular dystrophy Fall at home in the setting of frequent falls - No external evidence of acute injury - Physical therapy evaluation - No evidence of rhabdo.  CK WNL - TOC consult     Alcohol use disorder, moderate, dependence ([name]) - CIWA withdrawal protocol     Bipolar 2 disorder ([place]) - Continue home meds pending med rec  Leukocytosis -WBC 16,000, down from 34,000 - Likely residual leukemoid reaction - Consider hematology input.  Patient wants to follow-up post discharge  Hyponatremia, suspect acute -Suspect hypovolemic and in part related to chronic alcohol use - IV hydration with NS and monitor      Hyperglycemia due to type 2 diabetes mellitus ([name]) - Sliding scale insulin coverage   Alcoholic liver disease/hepatic steatosis  -Appears stable --will check ammonia level        DVT prophylaxis: Lovenox  Code Status: full code  Family Communication:  none  Disposition Plan: Back to previous home environment Consults called: none  Status:At the time of admission, it appears that the appropriate admission status for this patient is INPATIENT. This is judged to be reasonable and necessary in order to provide the required intensity of service to ensure the patient's safety given the presenting symptoms, physical exam findings, and initial radiographic and laboratory data in the context of their  Comorbid conditions.   Patient requires inpatient status due to high intensity of service, high risk for further deterioration and high frequency of surveillance required.   I certify that at the point of admission it is my clinical judgment that the patient will require inpatient hospital care spanning beyond [place] MD [facility]   02/12/2021, 1:05 AM

## 2021-02-12 NOTE — Progress Notes (Addendum)
PROGRESS NOTE    [name]  GYJ:[id number] DOB: November 07, 1975 DOA: 02/11/2021 PCP: Eunice Blase, PA-C   Chief Complaint  Patient presents with   Fall   Weakness    Brief Narrative:  45 yo with hx bipolar, T2Dm, alcoholic cirrhosis, FSH muscular dystrophy with frequent falls, s/p recent prolonged hospitalization for aspiration pneumonia, sepsis, and hepatic encephalopathy presenting to the ED with persistent weakness and recurrent falls after being discharged from SNF.   Assessment & Plan:   Principal Problem:   Frequent falls Active Problems:   Bipolar 2 disorder ([name])   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[name])   Diabetes ([name])   Alcoholic liver disease ([name])   Leukocytosis  Recurrent Falls  Generalized Weakness FSH muscular dystrophy Recurrent falls after recent discharge from SNF Discharged 11/10 from >1 month hospitalization for aspiration pneumonia, hepatic encephalopathy Likely component of deconditioning from prolonged hospital stay Head CT with head trauma Negative plain films of right tib/fib, right wrist (no recent fx or dislocation - possible residual 1 mm smooth marginated calcificatioN), and right knee Right forearm plain films with continued pain - consider ortho discussion PT/OT CK wnl TOC for placement  Alcohol Abuse Denies etoh since recent admission CIWA  Bipolar Prozac, zyprexa  Leukocytosis Dr. Cathie Hoops was consulted at last hospital stay and she followed with them Consider reconsulting if persistent  Hyponatremia Resolved  T2DM SSI  Alcoholic Liver Disease  Cirrhosis Continue to follow Lasix, spironolactone Lactulose, rifaximin  DVT prophylaxis: lovenox Code Status: full  Family Communication: none at bedside Disposition:   Status is: Inpatient  Remains inpatient appropriate because: need for safe discharge plan       Consultants:  none  Procedures:  none  Antimicrobials:  Anti-infectives (From admission,  onward)    Start     Dose/Rate Route Frequency Ordered Stop   02/12/21 1000  rifaximin (XIFAXAN) tablet 550 mg        550 mg Oral 2 times daily 02/12/21 0059            Subjective: C/o headache, notes she hit her head, no LOC C/o right forearm pain  Objective: Vitals:   02/12/21 0044 02/12/21 0459 02/12/21 0741 02/12/21 0810  BP: 123/64 120/86 134/84 (!) 127/56  Pulse: 96 96 96 94  Resp: 16 16 16 20   Temp:   98.5 F (36.9 C) 97.8 F (36.6 C)  TempSrc:   Oral Oral  SpO2: 91% 94% 97% 100%  Weight:      Height:       No intake or output data in the 24 hours ending 02/12/21 1155 Filed Weights   02/11/21 1134  Weight: 92.5 kg    Examination:  General exam: Appears calm and comfortable  Respiratory system: Clear to auscultation. Respiratory effort normal. Cardiovascular system: S1 & S2 heard, RRR.  Gastrointestinal system: Abdomen is distended, nontender Central nervous system: Alert and oriented. No focal neurological deficits. Extremities: no LEE - tenderness to R forearm Psychiatry: Judgement and insight appear normal. Mood & affect appropriate.   Data Reviewed: I have personally reviewed following labs and imaging studies  CBC: Recent Labs  Lab 02/11/21 1142 02/12/21 0218  WBC 16.1* 14.4*  HGB 10.7* 9.4*  HCT 34.0* 30.7*  MCV 106.6* 104.8*  PLT 241 221    Basic Metabolic Panel: Recent Labs  Lab 02/11/21 1142 02/12/21 0218 02/12/21 0444  NA 137  --  139  K 3.3*  --  3.5  CL 100  --  104  CO2  29  --  30  GLUCOSE 165*  --  202*  BUN 15  --  12  CREATININE 0.65 0.64 0.72  CALCIUM 8.8*  --  8.6*    GFR: Estimated Creatinine Clearance: 104.8 mL/min (by C-G formula based on SCr of 0.72 mg/dL).  Liver Function Tests: Recent Labs  Lab 02/11/21 1142 02/12/21 0444  AST 87* 93*  ALT 19 17  ALKPHOS 274* 241*  BILITOT 2.2* 2.0*  PROT 8.0 7.2  ALBUMIN 2.7* 2.4*    CBG: Recent Labs  Lab 02/12/21 0217 02/12/21 0823  GLUCAP 191* 191*      Recent Results (from the past 240 hour(s))  Resp Panel by RT-PCR (Flu Ovie Eastep&B, Covid) Nasopharyngeal Swab     Status: None   Collection Time: 02/12/21 12:44 AM   Specimen: Nasopharyngeal Swab; Nasopharyngeal(NP) swabs in vial transport medium  Result Value Ref Range Status   SARS Coronavirus 2 by RT PCR NEGATIVE NEGATIVE Final    Comment: (NOTE) SARS-CoV-2 target nucleic acids are NOT DETECTED.  The SARS-CoV-2 RNA is generally detectable in upper respiratory specimens during the acute phase of infection. The lowest concentration of SARS-CoV-2 viral copies this assay can detect is 138 copies/mL. Annie Roseboom negative result does not preclude SARS-Cov-2 infection and should not be used as the sole basis for treatment or other patient management decisions. [place] Prout negative result may occur with  improper specimen collection/handling, submission of specimen other than nasopharyngeal swab, presence of viral mutation(s) within the areas targeted by this assay, and inadequate number of viral copies(<138 copies/mL). Suzzanne Brunkhorst negative result must be combined with clinical observations, patient history, and epidemiological information. The expected result is Negative.  Fact Sheet for Patients:  BloggerCourse.com  Fact Sheet for Healthcare Providers:  SeriousBroker.it  This test is no t yet approved or cleared by the [place] FDA and  has been authorized for detection and/or diagnosis of SARS-CoV-2 by FDA under an Emergency Use Authorization (EUA). This EUA will remain  in effect (meaning this test can be used) for the duration of the COVID-19 declaration under Section 564(b)(1) of the Act, 21 U.S.C.section 360bbb-3(b)(1), unless the authorization is terminated  or revoked sooner.       Influenza Jamella Grayer by PCR NEGATIVE NEGATIVE Final   Influenza B by PCR NEGATIVE NEGATIVE Final    Comment: (NOTE) The Xpert Xpress SARS-CoV-2/FLU/RSV plus assay is intended as  an aid in the diagnosis of influenza from Nasopharyngeal swab specimens and should not be used as [place] Tippin sole basis for treatment. Nasal washings and aspirates are unacceptable for Xpert Xpress SARS-CoV-2/FLU/RSV testing.  Fact Sheet for Patients: BloggerCourse.com  Fact Sheet for Healthcare Providers: SeriousBroker.it  This test is not yet approved or cleared by the [place] FDA and has been authorized for detection and/or diagnosis of SARS-CoV-2 by FDA under an Emergency Use Authorization (EUA). This EUA will remain in effect (meaning this test can be used) for the duration of the COVID-19 declaration under Section 564(b)(1) of the Act, 21 U.S.C. section 360bbb-3(b)(1), unless the authorization is terminated or revoked.  Performed at [facility], [address], [place], [place] [address]          Radiology Studies: DG Wrist Complete Right  Result Date: 02/11/2021 CLINICAL DATA:  Trauma, fall EXAM: RIGHT WRIST - COMPLETE 3+ VIEW COMPARISON:  None. FINDINGS: No displaced fracture or dislocation is seen. There is 1 mm smooth marginated calcification adjacent to the distal radius, possibly an artifact or old tiny avulsion IMPRESSION: No recent  fracture or dislocation is seen. There is 1 mm smooth marginated calcification adjacent to the distal radius may be residual from previous injury. Electronically Signed   By: Ernie Avena M.D.   On: 02/11/2021 16:58   DG Tibia/Fibula Right  Result Date: 02/11/2021 CLINICAL DATA:  Trauma, fall EXAM: RIGHT TIBIA AND FIBULA - 2 VIEW COMPARISON:  None. FINDINGS: There is no evidence of fracture or other focal bone lesions. There are scattered soft tissue calcifications. IMPRESSION: No fracture or dislocation is seen in the right tibia and fibula. Electronically Signed   By: Ernie Avena M.D.   On: 02/11/2021 16:56   DG Knee Complete 4 Views Right  Result Date:  02/11/2021 CLINICAL DATA:  Trauma, fall EXAM: RIGHT KNEE - COMPLETE 4+ VIEW COMPARISON:  None. FINDINGS: No fracture or dislocation is seen. There is 2 mm smooth marginated calcification in the soft tissues adjacent to the inferior margin of patella, possibly soft tissue calcification from previous injury. IMPRESSION: No recent fracture or dislocation is seen in the right knee. Electronically Signed   By: Ernie Avena M.D.   On: 02/11/2021 16:57        Scheduled Meds:  atorvastatin  40 mg Oral Daily   budesonide  9 mg Oral Daily   calcium-vitamin D  1 tablet Oral Q breakfast   dicyclomine  20 mg Oral TID   enoxaparin (LOVENOX) injection  0.5 mg/kg Subcutaneous Q24H   FLUoxetine  80 mg Oral Daily   fluticasone  1 spray Each Nare Daily   folic acid  1 mg Oral Daily   furosemide  40 mg Oral Daily   insulin aspart  0-15 Units Subcutaneous TID WC   insulin aspart  0-5 Units Subcutaneous QHS   insulin aspart  12 Units Subcutaneous TID WC   insulin glargine-yfgn  28 Units Subcutaneous BID   magnesium oxide  400 mg Oral BID   multivitamin with minerals  1 tablet Oral Daily   OLANZapine  5 mg Oral QHS   pantoprazole  40 mg Oral Daily   pantoprazole  40 mg Oral Daily   pregabalin  75-150 mg Oral TID   rifaximin  550 mg Oral BID   spironolactone  100 mg Oral Daily   thiamine  100 mg Oral Daily   Continuous Infusions:   LOS: 0 days    Time spent: over 30 min    Lacretia Nicks, MD [facility]   To contact the attending provider between 7A-7P or the covering provider during after hours 7P-7A, please log into the web site [email or web address] and access using universal [place] password for that web site. If you do not have the password, please call the hospital operator.  02/12/2021, 11:55 AM

## 2021-02-13 LAB — GLUCOSE, CAPILLARY
Glucose-Capillary: 175 mg/dL — ABNORMAL HIGH (ref 70–99)
Glucose-Capillary: 110 mg/dL — ABNORMAL HIGH (ref 70–99)
Glucose-Capillary: 178 mg/dL — ABNORMAL HIGH (ref 70–99)
Glucose-Capillary: 359 mg/dL — ABNORMAL HIGH (ref 70–99)

## 2021-02-13 LAB — PROTIME-INR
INR: 1 (ref 0.8–1.2)
Prothrombin Time: 13.5 seconds (ref 11.4–15.2)

## 2021-02-13 LAB — CBC WITH DIFFERENTIAL/PLATELET
Abs Immature Granulocytes: 0.04 10*3/uL (ref 0.00–0.07)
Basophils Absolute: 0.1 10*3/uL (ref 0.0–0.1)
Basophils Relative: 1 %
Eosinophils Absolute: 0.1 10*3/uL (ref 0.0–0.5)
Eosinophils Relative: 1 %
HCT: 31.9 % — ABNORMAL LOW (ref 36.0–46.0)
Hemoglobin: 9.8 g/dL — ABNORMAL LOW (ref 12.0–15.0)
Immature Granulocytes: 0 %
Lymphocytes Relative: 27 %
Lymphs Abs: 3.3 10*3/uL (ref 0.7–4.0)
MCH: 31.6 pg (ref 26.0–34.0)
MCHC: 30.7 g/dL (ref 30.0–36.0)
MCV: 102.9 fL — ABNORMAL HIGH (ref 80.0–100.0)
Monocytes Absolute: 0.5 10*3/uL (ref 0.1–1.0)
Monocytes Relative: 4 %
Neutro Abs: 8.1 10*3/uL — ABNORMAL HIGH (ref 1.7–7.7)
Neutrophils Relative %: 67 %
Platelets: 218 10*3/uL (ref 150–400)
RBC: 3.1 MIL/uL — ABNORMAL LOW (ref 3.87–5.11)
RDW: 14.7 % (ref 11.5–15.5)
WBC: 12.1 10*3/uL — ABNORMAL HIGH (ref 4.0–10.5)
nRBC: 0 % (ref 0.0–0.2)

## 2021-02-13 LAB — COMPREHENSIVE METABOLIC PANEL
ALT: 14 U/L (ref 0–44)
AST: 56 U/L — ABNORMAL HIGH (ref 15–41)
Albumin: 2.4 g/dL — ABNORMAL LOW (ref 3.5–5.0)
Alkaline Phosphatase: 231 U/L — ABNORMAL HIGH (ref 38–126)
Anion gap: 5 (ref 5–15)
BUN: 17 mg/dL (ref 6–20)
CO2: 30 mmol/L (ref 22–32)
Calcium: 8.7 mg/dL — ABNORMAL LOW (ref 8.9–10.3)
Chloride: 105 mmol/L (ref 98–111)
Creatinine, Ser: 0.64 mg/dL (ref 0.44–1.00)
GFR, Estimated: >60 mL/min (ref >60)
Glucose, Bld: 228 mg/dL — ABNORMAL HIGH (ref 70–99)
Potassium: 3.6 mmol/L (ref 3.5–5.1)
Sodium: 140 mmol/L (ref 135–145)
Total Bilirubin: 1.8 mg/dL — ABNORMAL HIGH (ref 0.3–1.2)
Total Protein: 7.5 g/dL (ref 6.5–8.1)

## 2021-02-13 LAB — PHOSPHORUS: Phosphorus: 4.1 mg/dL (ref 2.5–4.6)

## 2021-02-13 LAB — MAGNESIUM: Magnesium: 1.5 mg/dL — ABNORMAL LOW (ref 1.7–2.4)

## 2021-02-13 MED ORDER — TRAMADOL HCL 50 MG PO TABS
50.0000 mg | ORAL_TABLET | Freq: Once | ORAL | Status: AC
  Administered 2021-02-13: 18:00:00 50 mg via ORAL
  Filled 2021-02-13: qty 1

## 2021-02-13 MED ORDER — LACTULOSE 10 GM/15ML PO SOLN
20.0000 g | Freq: Every day | ORAL | Status: DC
  Administered 2021-02-13 – 2021-02-14 (×2): 20 g via ORAL
  Filled 2021-02-13 (×3): qty 30

## 2021-02-13 NOTE — Progress Notes (Signed)
Physical Therapy Treatment Patient Details Name: [name] MRN: [id number] DOB: 1975/10/25 Today's Date: 02/13/2021   History of Present Illness Pt is a 45 y/o F admitted on 02/11/21 with c/c of fall & weakness.  PMH: bipolar disorder, DM2, alcoholic cirrhosis, FSH muscular dystrophy with frequent falls, recent prolonged hospitalization for aspiration PNA, sepsis & hepatic encephalopathy    PT Comments    Pt seen for PT tx with pt very motivated to participate as pt reports she was independent without AD prior to initial hospitalization a couple months ago. On this date pt is mod I with bed mobility but requires mod assist for sit>stand 2/2 weak hip extensors/glutes. Pt is able to perform stand pivot transfers with RW & ambulate short distance in room with RW & min assist with PT providing close w/c follow for safety. Pt does fatigue with multiple sit<>stands & requires ongoing cuing for hand placement. Pt is eager to participate & very motivated, will update d/c recommendations to CIR as pt would be able to tolerate 3 hours of intensive therapies & is eager to return to PLOF.     Recommendations for follow up therapy are one component of a multi-disciplinary discharge planning process, led by the attending physician.  Recommendations may be updated based on patient status, additional functional criteria and insurance authorization.  Follow Up Recommendations  Acute inpatient rehab (3hours/day)     Assistance Recommended at Discharge Frequent or constant Supervision/Assistance  Equipment Recommendations  Rolling walker (2 wheels);BSC/3in1;Wheelchair (measurements PT);Wheelchair cushion (measurements PT) (non emergent EMS transport home)    Recommendations for Other Services Rehab consult     Precautions / Restrictions Precautions Precautions: Fall Restrictions Weight Bearing Restrictions: No     Mobility  Bed Mobility Overal bed mobility: Modified Independent Bed Mobility:  Supine to Sit     Supine to sit: Modified independent (Device/Increase time);HOB elevated          Transfers   Equipment used: Rolling walker (2 wheels) Transfers: Sit to/from Stand;Bed to chair/wheelchair/BSC Sit to Stand: Mod assist Stand pivot transfers: Min assist         General transfer comment: Ongoing cuing re: safe hand placement during sit>stand transfers. Mod assist to power up, min assist for stand pivot to recliner. Pt with weak hip extensors & glutes & requires extra time to upright herself once buttocks clears the seat. Pt performs multiple sit<>stand during session from low recliner & from EOB.    Ambulation/Gait Ambulation/Gait assistance: Min assist Gait Distance (Feet): 10 Feet Assistive device: Rolling walker (2 wheels) Gait Pattern/deviations: Decreased step length - right;Decreased step length - left;Decreased stride length;Decreased dorsiflexion - right;Decreased dorsiflexion - left Gait velocity: decreased     General Gait Details: Slightly excessive hip flexion to allow BLE foot clearance 2/2 hx of food drop & pt not having AFOs present. PT also provides close chair follow for safety.   Stairs             Wheelchair Mobility    Modified Rankin (Stroke Patients Only)       Balance Overall balance assessment: Needs assistance Sitting-balance support: Single extremity supported;Feet supported Sitting balance-Leahy Scale: Normal Sitting balance - Comments: Pt able to perform lateral leans in both directions without LOB to doff soiled underwear.   Standing balance support: Bilateral upper extremity supported;During functional activity;Reliant on assistive device for balance Standing balance-Leahy Scale: Poor Standing balance comment: BUE support on RW, min assist  Cognition Arousal/Alertness: Awake/alert Behavior During Therapy: WFL for tasks assessed/performed Overall Cognitive Status: Within  Functional Limits for tasks assessed                                 General Comments: Motivated to participate & increase independence with mobility. Good understanding & receptiveness of education.        Exercises Other Exercises Other Exercises: BLE bridging while supine in bed with cuing for exhalation upon exertion, PT provides min assist for neutral BLE alignment to prevent splaying.    General Comments General comments (skin integrity, edema, etc.): Pt urinates & purewick is unhooked so pt changes underwear & gown; pt requires assistance to pull brief over hips while standing.      Pertinent Vitals/Pain Pain Assessment: No/denies pain    Home Living                          Prior Function            PT Goals (current goals can now be found in the care plan section) Acute Rehab PT Goals Patient Stated Goal: get better, walk again PT Goal Formulation: With patient Time For Goal Achievement: 02/26/21 Potential to Achieve Goals: Fair Progress towards PT goals: Progressing toward goals    Frequency    7X/week      PT Plan Discharge plan needs to be updated    Co-evaluation              AM-PAC PT "6 Clicks" Mobility   Outcome Measure  Help needed turning from your back to your side while in a flat bed without using bedrails?: None Help needed moving from lying on your back to sitting on the side of a flat bed without using bedrails?: None Help needed moving to and from a bed to a chair (including a wheelchair)?: A Little Help needed standing up from a chair using your arms (e.g., wheelchair or bedside chair)?: A Lot Help needed to walk in hospital room?: A Lot Help needed climbing 3-5 steps with a railing? : Total 6 Click Score: 16    End of Session Equipment Utilized During Treatment: Gait belt Activity Tolerance: Patient tolerated treatment well Patient left: in bed;with call bell/phone within reach (MD in room) Nurse  Communication: Mobility status PT Visit Diagnosis: Muscle weakness (generalized) (M62.81);Difficulty in walking, not elsewhere classified (R26.2);Unsteadiness on feet (R26.81);Other abnormalities of gait and mobility (R26.89)     Time: 1287-8676 PT Time Calculation (min) (ACUTE ONLY): 31 min  Charges:  $Therapeutic Activity: 23-37 mins                     Aleda Grana, PT, DPT 02/13/21, 12:01 PM    Sandi Mariscal 02/13/2021, 11:58 AM

## 2021-02-13 NOTE — TOC Progression Note (Signed)
Transition of Care [facility]) - Progression Note    Patient Details  Name: [name] MRN: [id number] Date of Birth: 06/09/1975  Transition of Care [facility] CM/SW Contact  Caryn Section, RN Phone Number: 02/13/2021, 11:14 AM  Clinical Narrative:   Spoke with patient with PT at bedside.  Patient now requests SNF.  Patient has used a number of days at [place] (she does not want to return to [facility]).  Explained that SNF will require payment up front, but I can send bed search.  PT states they will work with her and discuss recommendations from today's workup.  Patient would like to wait until PT has updated recommendations.  TOC to follow.         Expected Discharge Plan and Services                                                 Social Determinants of Health (SDOH) Interventions    Readmission Risk Interventions No flowsheet data found.

## 2021-02-13 NOTE — Progress Notes (Addendum)
Inpatient Rehab Admissions Coordinator:   Per therapy recommendations, patient was screened for CIR candidacy by Megan Salon, MS, CCC-SLP At this time, medical necessity for CIR admission is unclear. I have requested MD review and will update chart once I receive a response.   Megan Salon, MS, CCC-SLP Rehab Admissions Coordinator  [phone] (celll) [phone] (office)

## 2021-02-13 NOTE — Progress Notes (Signed)
[facility]  - [place] at [facility]   PATIENT NAME: [name]    MR#:  [id number]  DATE OF BIRTH:  04/03/1975  SUBJECTIVE:   patient worked with physical therapy. Sitting on the recliner chair. Complains of weakness. Overall improving slowly. Denies any alcohol or drug use lately. Was discharged from rehab few days ago to [facility] Did not have any equipment. Came back with weakness. REVIEW OF SYSTEMS:   Review of Systems  Constitutional:  Negative for chills, fever and weight loss.  HENT:  Negative for ear discharge, ear pain and nosebleeds.   Eyes:  Negative for blurred vision, pain and discharge.  Respiratory:  Negative for sputum production, shortness of breath, wheezing and stridor.   Cardiovascular:  Negative for chest pain, palpitations, orthopnea and PND.  Gastrointestinal:  Negative for abdominal pain, diarrhea, nausea and vomiting.  Genitourinary:  Negative for frequency and urgency.  Musculoskeletal:  Negative for back pain and joint pain.  Neurological:  Positive for weakness. Negative for sensory change, speech change and focal weakness.  Psychiatric/Behavioral:  Negative for depression and hallucinations. The patient is not nervous/anxious.   Tolerating Diet: yes Tolerating PT: yes  DRUG ALLERGIES:   Allergies  Allergen Reactions   Erythromycin Anaphylaxis   Sulfa Antibiotics Anaphylaxis   Tylenol [Acetaminophen] Other (See Comments)    Pancreatitis and liver dysfunction, not supposed to take med   Nsaids Other (See Comments)    Causes GI problems, very bad reaction-per patient.    Gabapentin     Mood changes     VITALS:  Blood pressure 122/82, pulse 93, temperature 98 F (36.7 C), temperature source Oral, resp. rate 18, height 5\' 7"  (1.702 m), weight 92.5 kg, SpO2 98 %.  PHYSICAL EXAMINATION:   Physical Exam  GENERAL:  45 y.o.-year-old patient lying in the bed with no acute distress. Obese HEENT: Head atraumatic, normocephalic. Oropharynx  and nasopharynx clear.  LUNGS: Normal breath sounds bilaterally, no wheezing, rales, rhonchi. No use of accessory muscles of respiration.  CARDIOVASCULAR: S1, S2 normal. No murmurs, rubs, or gallops.  ABDOMEN: Soft, nontender, nondistended. Bowel sounds present. No organomegaly or mass.  EXTREMITIES: No cyanosis, clubbing or edema b/l.    NEUROLOGIC: nonfocal PSYCHIATRIC:  patient is alert and oriented x 3.  SKIN: No obvious rash, lesion, or ulcer.   LABORATORY PANEL:  CBC Recent Labs  Lab 02/13/21 0436  WBC 12.1*  HGB 9.8*  HCT 31.9*  PLT 218    Chemistries  Recent Labs  Lab 02/13/21 0436  NA 140  K 3.6  CL 105  CO2 30  GLUCOSE 228*  BUN 17  CREATININE 0.64  CALCIUM 8.7*  MG 1.5*  AST 56*  ALT 14  ALKPHOS 231*  BILITOT 1.8*   Cardiac Enzymes No results for input(s): TROPONINI in the last 168 hours. RADIOLOGY:  DG Forearm Right  Result Date: 02/12/2021 CLINICAL DATA:  Multiple falls in the past few days. Pain on proximal radial aspect of forearm. EXAM: RIGHT FOREARM - 2 VIEW COMPARISON:  Right elbow radiograph 01/16/2007, right forearm radiograph 08/30/2003 FINDINGS: There is no evidence of fracture or other focal bone lesions. Soft tissues are unremarkable. IMPRESSION: Negative. Electronically Signed   By: 09/01/2003 M.D.   On: 02/12/2021 14:31   DG Wrist Complete Right  Result Date: 02/11/2021 CLINICAL DATA:  Trauma, fall EXAM: RIGHT WRIST - COMPLETE 3+ VIEW COMPARISON:  None. FINDINGS: No displaced fracture or dislocation is seen. There is 1 mm smooth marginated calcification  adjacent to the distal radius, possibly an artifact or old tiny avulsion IMPRESSION: No recent fracture or dislocation is seen. There is 1 mm smooth marginated calcification adjacent to the distal radius may be residual from previous injury. Electronically Signed   By: Ernie Avena M.D.   On: 02/11/2021 16:58   DG Tibia/Fibula Right  Result Date: 02/11/2021 CLINICAL DATA:   Trauma, fall EXAM: RIGHT TIBIA AND FIBULA - 2 VIEW COMPARISON:  None. FINDINGS: There is no evidence of fracture or other focal bone lesions. There are scattered soft tissue calcifications. IMPRESSION: No fracture or dislocation is seen in the right tibia and fibula. Electronically Signed   By: Ernie Avena M.D.   On: 02/11/2021 16:56   CT HEAD WO CONTRAST ( )  Result Date: 02/12/2021 CLINICAL DATA:  Head trauma due to fall. EXAM: CT HEAD WITHOUT CONTRAST TECHNIQUE: Contiguous axial images were obtained from the base of the skull through the vertex without intravenous contrast. COMPARISON:  12/17/2020 FINDINGS: Brain: No evidence of acute infarction, hemorrhage, hydrocephalus, extra-axial collection or mass lesion/mass effect. Vascular: No hyperdense vessel or unexpected calcification. Skull: Normal. Negative for fracture or focal lesion. Sinuses/Orbits: No acute finding. IMPRESSION: Negative head CT. Electronically Signed   By: Tiburcio Pea M.D.   On: 02/12/2021 12:03   DG Knee Complete 4 Views Right  Result Date: 02/11/2021 CLINICAL DATA:  Trauma, fall EXAM: RIGHT KNEE - COMPLETE 4+ VIEW COMPARISON:  None. FINDINGS: No fracture or dislocation is seen. There is 2 mm smooth marginated calcification in the soft tissues adjacent to the inferior margin of patella, possibly soft tissue calcification from previous injury. IMPRESSION: No recent fracture or dislocation is seen in the right knee. Electronically Signed   By: Ernie Avena M.D.   On: 02/11/2021 16:57   ASSESSMENT AND PLAN:   45 yo with hx bipolar, T2Dm, alcoholic cirrhosis, FSH muscular dystrophy with frequent falls, s/p recent prolonged hospitalization for aspiration pneumonia, sepsis, and hepatic encephalopathy presenting to the ED with persistent weakness and recurrent falls after being discharged from SNF.    Recurrent Falls  Generalized Weakness FSH muscular dystrophy --Recurrent falls after recent discharge from  SNF --Discharged 11/10 from >1 month hospitalization for aspiration pneumonia, hepatic encephalopathy --Likely component of deconditioning from prolonged hospital stay --Head CT with head trauma --Negative plain films of right tib/fib, right wrist (no recent fx or dislocation - possible residual 1 mm smooth marginated calcificatioN), and right knee --PT/OT-- PT recommends acute rehab--TOC for d/c planning --CK wnl   Alcohol Abuse Denies etoh since recent admission CIWA--scoring low   Bipolar Prozac, zyprexa   Leukocytosis --Dr. Cathie Hoops was consulted at last hospital stay and she followed with them --No source of infection identified   Hyponatremia Resolved   T2DM, with hyperglycemia, obesity SSI  cont long ans short acting insulin  Alcoholic Liver Disease  Cirrhosis Continue to follow Lasix, spironolactone Lactulose, rifaximin   DVT prophylaxis: lovenox Code Status: full  Family Communication: none at bedside Disposition:    Status is: Inpatient   Remains inpatient appropriate because: need for safe discharge plan             TOTAL TIME TAKING CARE OF THIS PATIENT: 30 minutes.  >50% time spent on counselling and coordination of care  Note: This dictation was prepared with Dragon dictation along with smaller phrase technology. Any transcriptional errors that result from this process are unintentional.  Enedina Finner M.D    [facility]   CC: Primary care physician; O'Buch,  Edgardo Roys, PA-C Patient ID: [name], female   DOB: [birth year], 45 y.o.   MRN: [id number]

## 2021-02-14 LAB — GLUCOSE, CAPILLARY
Glucose-Capillary: 140 mg/dL — ABNORMAL HIGH (ref 70–99)
Glucose-Capillary: 78 mg/dL (ref 70–99)
Glucose-Capillary: 315 mg/dL — ABNORMAL HIGH (ref 70–99)
Glucose-Capillary: 203 mg/dL — ABNORMAL HIGH (ref 70–99)

## 2021-02-14 MED ORDER — OLANZAPINE 5 MG PO TABS: 5.0000 mg | ORAL_TABLET | Freq: Every day | ORAL | 0 refills | Status: DC

## 2021-02-14 MED ORDER — FOLIC ACID 1 MG PO TABS: 1.0000 mg | ORAL_TABLET | Freq: Every day | ORAL | 1 refills | Status: DC

## 2021-02-14 MED ORDER — INSULIN GLARGINE-YFGN 100 UNIT/ML ~~LOC~~ SOLN
30.0000 [IU] | Freq: Two times a day (BID) | SUBCUTANEOUS | Status: DC
  Administered 2021-02-14 – 2021-02-15 (×2): 30 [IU] via SUBCUTANEOUS
  Filled 2021-02-14 (×4): qty 0.3

## 2021-02-14 MED ORDER — FLUOXETINE HCL 40 MG PO CAPS
80.0000 mg | ORAL_CAPSULE | Freq: Every day | ORAL | 1 refills | Status: AC
Start: 2021-02-14 — End: ?

## 2021-02-14 MED ORDER — PREGABALIN 150 MG PO CAPS: 150.0000 mg | ORAL_CAPSULE | Freq: Three times a day (TID) | ORAL | 0 refills | Status: DC

## 2021-02-14 MED ORDER — RIFAXIMIN 550 MG PO TABS: 550.0000 mg | ORAL_TABLET | Freq: Two times a day (BID) | ORAL | 1 refills | Status: DC

## 2021-02-14 MED ORDER — PANTOPRAZOLE SODIUM 40 MG PO TBEC
40.0000 mg | DELAYED_RELEASE_TABLET | Freq: Every day | ORAL | 2 refills | Status: AC
Start: 2021-02-14 — End: ?

## 2021-02-14 MED ORDER — INSULIN LISPRO 100 UNIT/ML IJ SOLN: 12.0000 [IU] | Freq: Three times a day (TID) | INTRAMUSCULAR | 3 refills | Status: DC

## 2021-02-14 MED ORDER — INSULIN ASPART 100 UNIT/ML IJ SOLN
15.0000 [IU] | Freq: Three times a day (TID) | INTRAMUSCULAR | Status: DC
  Administered 2021-02-14 – 2021-02-15 (×2): 15 [IU] via SUBCUTANEOUS
  Filled 2021-02-14: qty 1

## 2021-02-14 MED ORDER — ATORVASTATIN CALCIUM 40 MG PO TABS
40.0000 mg | ORAL_TABLET | Freq: Every day | ORAL | 2 refills | Status: AC
Start: 2021-02-14 — End: ?

## 2021-02-14 MED ORDER — INSULIN GLARGINE-YFGN 100 UNIT/ML ~~LOC~~ SOLN: 28.0000 [IU] | Freq: Two times a day (BID) | SUBCUTANEOUS | 11 refills | Status: DC

## 2021-02-14 MED ORDER — SPIRONOLACTONE 100 MG PO TABS: 100.0000 mg | ORAL_TABLET | Freq: Every day | ORAL | 1 refills | Status: DC

## 2021-02-14 MED ORDER — LACTULOSE 10 GM/15ML PO SOLN: 20.0000 g | Freq: Two times a day (BID) | ORAL | 1 refills | Status: DC | PRN

## 2021-02-14 MED ORDER — FUROSEMIDE 40 MG PO TABS: 40.0000 mg | ORAL_TABLET | Freq: Every day | ORAL | 1 refills | Status: DC

## 2021-02-14 MED ORDER — INSULIN GLARGINE-YFGN 100 UNIT/ML ~~LOC~~ SOLN: 28.0000 [IU] | Freq: Two times a day (BID) | SUBCUTANEOUS | 3 refills | Status: DC

## 2021-02-14 MED ORDER — ADULT MULTIVITAMIN W/MINERALS CH: 1.0000 | ORAL_TABLET | Freq: Every day | ORAL | 1 refills | Status: DC

## 2021-02-14 NOTE — Progress Notes (Signed)
Physical Therapy Treatment Patient Details Name: [name] MRN: [id number] DOB: 01/28/1976 Today's Date: 02/14/2021   History of Present Illness Pt is a 45 y/o F admitted on 02/11/21 with c/c of fall & weakness.  PMH: bipolar disorder, DM2, alcoholic cirrhosis, FSH muscular dystrophy with frequent falls, recent prolonged hospitalization for aspiration PNA, sepsis & hepatic encephalopathy    PT Comments    Pt received supine in bed, agreeable to therapy. She was able to increase ambulation distance however continues to require MIN A for steadying, safety and chair follow. Pt continues to demonstrate significant weakness in glutes and quads, RLE weaker than LLE. Due to this LE weakness, pt is reliant on UE's in order to stand. Remains MOD A to stand from low surfaces but did progress to close CGA when EOB was elevated. LE therex completed at end of session to encourage strengthening and muscle fiber recruitment. Would benefit from skilled PT to address above deficits and promote optimal return to PLOF.   Recommendations for follow up therapy are one component of a multi-disciplinary discharge planning process, led by the attending physician.  Recommendations may be updated based on patient status, additional functional criteria and insurance authorization.  Follow Up Recommendations  Acute inpatient rehab (3hours/day)     Assistance Recommended at Discharge Frequent or constant Supervision/Assistance  Equipment Recommendations  Rolling walker (2 wheels);BSC/3in1;Wheelchair (measurements PT);Wheelchair cushion (measurements PT) (non emergent EMS transport home)    Recommendations for Other Services Rehab consult     Precautions / Restrictions Precautions Precautions: Fall Restrictions Weight Bearing Restrictions: No     Mobility  Bed Mobility Overal bed mobility: Modified Independent Bed Mobility: Supine to Sit;Sit to Supine     Supine to sit: Modified independent  (Device/Increase time);HOB elevated Sit to supine: Modified independent (Device/Increase time)   General bed mobility comments: With use of bedrails, requires no physical assist    Transfers Overall transfer level: Needs assistance Equipment used: Rolling walker (2 wheels) Transfers: Sit to/from Stand Sit to Stand: Mod assist           General transfer comment: MOD A to lift from low surface EOB using RW. Good carryover of previous VC on hand placement.    Ambulation/Gait Ambulation/Gait assistance: Min assist Gait Distance (Feet): 30 Feet Assistive device: Rolling walker (2 wheels) Gait Pattern/deviations: Decreased stride length;Decreased dorsiflexion - right;Decreased dorsiflexion - left;Step-through pattern;Decreased step length - right;Decreased step length - left Gait velocity: decreased     General Gait Details: Within room, good navigation around obstacles. Chair follow (recliner) for safety. Foot drop bilaterally requiring increased hip flexion for foot clearance.   Stairs             Wheelchair Mobility    Modified Rankin (Stroke Patients Only)       Balance Overall balance assessment: Needs assistance Sitting-balance support: Feet supported;No upper extremity supported Sitting balance-Leahy Scale: Normal Sitting balance - Comments: Able to obtain various sitting positions while EOB without LOB.   Standing balance support: Bilateral upper extremity supported;During functional activity;Reliant on assistive device for balance Standing balance-Leahy Scale: Poor Standing balance comment: BUE support on RW, min assist                            Cognition Arousal/Alertness: Awake/alert Behavior During Therapy: WFL for tasks assessed/performed Overall Cognitive Status: Within Functional Limits for tasks assessed  General Comments: Motivated to participate & agreeable to attempting all exercises.         Exercises General Exercises - Lower Extremity Long Arc Quad: Strengthening;Both;10 reps;Seated (3 second hold) Hip ABduction/ADduction: Strengthening;Both;10 reps;Supine (resisted with 3 second hold, in hooklying) Straight Leg Raises: Strengthening;Both;10 reps;Supine (AAROM to assist RLE maintain extension) Other Exercises Other Exercises: LE therex listed above in addition to: hooklying bridges with PT assisting to maintain neutral alignment at hips; STS 2 x 5 from elevated surface with pt progressing to close CGA.    General Comments        Pertinent Vitals/Pain Pain Assessment: No/denies pain    Home Living                          Prior Function            PT Goals (current goals can now be found in the care plan section) Acute Rehab PT Goals Patient Stated Goal: get better, walk again PT Goal Formulation: With patient Time For Goal Achievement: 02/26/21 Potential to Achieve Goals: Fair    Frequency    7X/week      PT Plan      Co-evaluation              AM-PAC PT "6 Clicks" Mobility   Outcome Measure  Help needed turning from your back to your side while in a flat bed without using bedrails?: None Help needed moving from lying on your back to sitting on the side of a flat bed without using bedrails?: A Little Help needed moving to and from a bed to a chair (including a wheelchair)?: A Little Help needed standing up from a chair using your arms (e.g., wheelchair or bedside chair)?: A Lot Help needed to walk in hospital room?: A Lot Help needed climbing 3-5 steps with a railing? : Total 6 Click Score: 15    End of Session Equipment Utilized During Treatment: Gait belt Activity Tolerance: Patient tolerated treatment well Patient left: in bed;with call bell/phone within reach;with bed alarm set (MD in room) Nurse Communication: Mobility status PT Visit Diagnosis: Muscle weakness (generalized) (M62.81);Difficulty in walking, not  elsewhere classified (R26.2);Unsteadiness on feet (R26.81);Other abnormalities of gait and mobility (R26.89)     Time: 1026-1106 PT Time Calculation (min) (ACUTE ONLY): 40 min  Charges:  $Therapeutic Exercise: 23-37 mins $Therapeutic Activity: 8-22 mins                     Basilia Jumbo PT, DPT 02/14/21 12:54 PM [phone]

## 2021-02-14 NOTE — Progress Notes (Signed)
Inpatient Rehab Admissions Coordinator:   Rehab MD reviewed case and he feels that Pt. Does not meet criteria for CIR at this time. TOC will need to pursue other rehab venues.  Megan Salon, MS, CCC-SLP Rehab Admissions Coordinator  [phone] (celll) [phone] (office)

## 2021-02-14 NOTE — Progress Notes (Signed)
Patient being discharged home with insulin scheduled.  Patient educated on insulin administration.  Her cousin is in the room and states that she gives herself insulin daily and can be a resource for patient.  Patient was able to demonstrate SQ insulin administration.  Patient states that she is confident that she will be able to give herself insulin.  Patient denies any questions or concerns at this time.  Awaiting EMS for transport.

## 2021-02-14 NOTE — Discharge Summary (Addendum)
[facility] NAME: [name]    MR#:  [id number]  DATE OF BIRTH:  1976/01/13  DATE OF ADMISSION:  02/11/2021 ADMITTING PHYSICIAN: Athena Masse, MD  DATE OF DISCHARGE: 02/14/2021  PRIMARY CARE PHYSICIAN: O'Buch, Alvis Lemmings, PA-C    ADMISSION DIAGNOSIS:  Weakness [R53.1] Frequent falls [R29.6] Fall, initial encounter [W19.XXXA]  DISCHARGE DIAGNOSIS:  Gen weakness with fall  SECONDARY DIAGNOSIS:   Past Medical History:  Diagnosis Date   Alcohol abuse    Anxiety    Anxiety    Anxiety and depression    Asthma    Bipolar disorder ([place])    Borderline personality disorder ([place])    Depression    Diabetes ([place])    Type 2    Dyslipidemia    Esophageal varices ([name])    Foot drop    right   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[name])    GERD (gastroesophageal reflux disease)    Gout    History of drug abuse ([place])    Insomnia    Muscular dystrophies ([name])    Neuropathy    Pancreatitis    Recovering alcoholic ([place])     HOSPITAL COURSE:   45 yo with hx bipolar, T7SV, alcoholic cirrhosis, FSH muscular dystrophy with frequent falls, s/p recent prolonged hospitalization for aspiration pneumonia, sepsis, and hepatic encephalopathy presenting to the ED with persistent weakness and recurrent falls after being discharged from SNF.    Recurrent Falls  Generalized Weakness FSH muscular dystrophy --Recurrent falls after recent discharge from SNF --Discharged 11/10 from >1 month hospitalization for aspiration pneumonia, hepatic encephalopathy --Likely component of deconditioning from prolonged hospital stay --Head CT with head trauma --Negative plain films of right tib/fib, right wrist (no recent fx or dislocation - possible residual 1 mm smooth marginated calcificatioN), and right knee --PT/OT-- PT recommends acute rehab--TOC for d/c planning --CK wnl --[facility]IR at [facility] declined. Pt has no other option but d/c to [facility]  with HH. D/w TOC lead Manuela Schwartz   Alcohol Abuse Denies etoh since recent admission CIWA--scoring low   Bipolar Prozac, zyprexa   Leukocytosis --Dr. Tasia Catchings was consulted at last hospital stay and she followed with them --No source of infection identified   Hyponatremia Resolved   T2DM, with hyperglycemia, obesity SSI  cont long and short acting insulin Non compliance to diet is a BIG issue   Alcoholic Liver Disease  Cirrhosis Continue to follow Lasix, spironolactone Lactulose, rifaximin   DVT prophylaxis: lovenox Code Status: full  Family Communication: none at bedside Disposition: to motel with [place] and 3-in-1 provided        CONSULTS OBTAINED:    DRUG ALLERGIES:   Allergies  Allergen Reactions   Erythromycin Anaphylaxis   Sulfa Antibiotics Anaphylaxis   Tylenol [Acetaminophen] Other (See Comments)    Pancreatitis and liver dysfunction, not supposed to take med   Nsaids Other (See Comments)    Causes GI problems, very bad reaction-per patient.    Gabapentin     Mood changes     DISCHARGE MEDICATIONS:   Allergies as of 02/14/2021       Reactions   Erythromycin Anaphylaxis   Sulfa Antibiotics Anaphylaxis   Tylenol [acetaminophen] Other (See Comments)   Pancreatitis and liver dysfunction, not supposed to take med   Nsaids Other (See Comments)   Causes GI problems, very bad reaction-per patient.    Gabapentin    Mood changes         Medication  List     STOP taking these medications    diphenoxylate-atropine 2.5-0.025 MG tablet Commonly known as: LOMOTIL   insulin aspart 100 UNIT/ML injection Commonly known as: novoLOG   LORazepam 0.5 MG tablet Commonly known as: ATIVAN   methocarbamol 500 MG tablet Commonly known as: ROBAXIN   naloxone 4 MG/0.1ML Liqd nasal spray kit Commonly known as: NARCAN   omeprazole 20 MG capsule Commonly known as: PRILOSEC   promethazine 25 MG suppository Commonly known as: PHENERGAN   promethazine 25 MG  tablet Commonly known as: PHENERGAN       TAKE these medications    albuterol 108 (90 Base) MCG/ACT inhaler Commonly known as: VENTOLIN HFA Inhale 2 puffs into the lungs every 6 (six) hours as needed for wheezing.   atorvastatin 40 MG tablet Commonly known as: LIPITOR Take 1 tablet (40 mg total) by mouth daily.   budesonide 3 MG 24 hr capsule Commonly known as: ENTOCORT EC Take 9 mg by mouth daily.   calcium-vitamin D 500-200 MG-UNIT tablet Commonly known as: OSCAL WITH D Take 1 tablet by mouth daily with breakfast.   dicyclomine 20 MG tablet Commonly known as: BENTYL Take 20 mg by mouth 3 (three) times daily.   FLUoxetine 40 MG capsule Commonly known as: PROZAC Take 2 capsules (80 mg total) by mouth daily.   fluticasone 50 MCG/ACT nasal spray Commonly known as: FLONASE Place 1 spray into both nostrils daily.   folic acid 1 MG tablet Commonly known as: FOLVITE Take 1 tablet (1 mg total) by mouth daily.   furosemide 40 MG tablet Commonly known as: LASIX Take 1 tablet (40 mg total) by mouth daily.   insulin glargine-yfgn 100 UNIT/ML injection Commonly known as: SEMGLEE Inject 0.28 mLs (28 Units total) into the skin 2 (two) times daily. What changed: how much to take   insulin lispro 100 UNIT/ML injection Commonly known as: HUMALOG Inject 0.12 mLs (12 Units total) into the skin 3 (three) times daily with meals.   lactulose 10 GM/15ML solution Commonly known as: CHRONULAC Take 30 mLs (20 g total) by mouth 2 (two) times daily as needed for mild constipation.   magnesium oxide 400 (240 Mg) MG tablet Commonly known as: MAG-OX Take 1 tablet (400 mg total) by mouth 2 (two) times daily.   multivitamin with minerals Tabs tablet Take 1 tablet by mouth daily.   OLANZapine 5 MG tablet Commonly known as: ZYPREXA Take 1 tablet (5 mg total) by mouth at bedtime.   pantoprazole 40 MG tablet Commonly known as: PROTONIX Take 1 tablet (40 mg total) by mouth daily.    pregabalin 150 MG capsule Commonly known as: LYRICA Take 1 capsule (150 mg total) by mouth 3 (three) times daily.   rifaximin 550 MG Tabs tablet Commonly known as: XIFAXAN Take 1 tablet (550 mg total) by mouth 2 (two) times daily.   spironolactone 100 MG tablet Commonly known as: ALDACTONE Take 1 tablet (100 mg total) by mouth daily.   thiamine 100 MG tablet Take 1 tablet (100 mg total) by mouth daily.               Durable Medical Equipment  (From admission, onward)           Start     Ordered   02/14/21 1309  For home use only DME lightweight manual wheelchair with seat cushion  Once       Comments: Patient suffers from [place] which impairs their ability to perform daily  activities like bathing, dressing, and grooming in the home.  A walker will not resolve  issue with performing activities of daily living. A wheelchair will allow patient to safely perform daily activities. Patient is not able to propel themselves in the home using a standard weight wheelchair due to general weakness. Patient can self propel in the lightweight wheelchair. Length of need Lifetime. Accessories: elevating leg rests (ELRs), wheel locks, extensions and anti-tippers.   02/14/21 1310   02/14/21 1307  DME Walker  Once       Question Answer Comment  Walker: With [place] Wheels   Patient needs a walker to treat with the following condition Weakness      02/14/21 1307   02/14/21 1307  DME 3-in-1  Once        02/14/21 1307   02/14/21 1300  For home use only DME Walker rolling  Once       Question Answer Comment  Walker: With [place]   Patient needs a walker to treat with the following condition Impaired ambulation      02/14/21 1259   02/14/21 1300  For home use only DME 3 n 1  Once        02/14/21 1300            If you experience worsening of your admission symptoms, develop shortness of breath, life threatening emergency, suicidal or homicidal thoughts you must  seek medical attention immediately by calling 911 or calling your MD immediately  if symptoms less severe.  You Must read complete instructions/literature along with all the possible adverse reactions/side effects for all the Medicines you take and that have been prescribed to you. Take any new Medicines after you have completely understood and accept all the possible adverse reactions/side effects.   Please note  You were cared for by a hospitalist during your hospital stay. If you have any questions about your discharge medications or the care you received while you were in the hospital after you are discharged, you can call the unit and asked to speak with the hospitalist on call if the hospitalist that took care of you is not available. Once you are discharged, your primary care physician will handle any further medical issues. Please note that NO REFILLS for any discharge medications will be authorized once you are discharged, as it is imperative that you return to your primary care physician (or establish a relationship with a primary care physician if you do not have one) for your aftercare needs so that they can reassess your need for medications and monitor your lab values. Today   SUBJECTIVE   No new complaints Eating chinese, mc donald and cherry coke brought from outside  VITAL SIGNS:  Blood pressure (!) 145/85, pulse 79, temperature 98.1 F (36.7 C), temperature source Oral, resp. rate 17, height $RemoveBe'5\' 7"'KDQYNDoIc$  (1.702 m), weight 92.5 kg, SpO2 98 %.  I/O:   Intake/Output Summary (Last 24 hours) at 02/14/2021 1601 Last data filed at 02/14/2021 1145 Gross per 24 hour  Intake --  Output 1050 ml  Net -1050 ml    PHYSICAL EXAMINATION:  GENERAL:  45 y.o.-year-old patient lying in the bed with no acute distress. Obese LUNGS: Normal breath sounds bilaterally, no wheezing, rales,rhonchi or crepitation. No use of accessory muscles of respiration.  CARDIOVASCULAR: S1, S2 normal. No murmurs,  rubs, or gallops.  ABDOMEN: Soft, non-tender, non-distended. Bowel sounds present. No organomegaly or mass.  EXTREMITIES: No pedal edema, cyanosis, or clubbing.  NEUROLOGIC: non-focal PSYCHIATRIC:  patient is alert and oriented x 3.  SKIN: No obvious rash, lesion, or ulcer.   DATA REVIEW:   CBC  Recent Labs  Lab 02/13/21 0436  WBC 12.1*  HGB 9.8*  HCT 31.9*  PLT 218    Chemistries  Recent Labs  Lab 02/13/21 0436  NA 140  K 3.6  CL 105  CO2 30  GLUCOSE 228*  BUN 17  CREATININE 0.64  CALCIUM 8.7*  MG 1.5*  AST 56*  ALT 14  ALKPHOS 231*  BILITOT 1.8*    Microbiology Results   Recent Results (from the past 240 hour(s))  Resp Panel by RT-PCR (Flu A&B, Covid) Nasopharyngeal Swab     Status: None   Collection Time: 02/12/21 12:44 AM   Specimen: Nasopharyngeal Swab; Nasopharyngeal(NP) swabs in vial transport medium  Result Value Ref Range Status   SARS Coronavirus 2 by RT PCR NEGATIVE NEGATIVE Final    Comment: (NOTE) SARS-CoV-2 target nucleic acids are NOT DETECTED.  The SARS-CoV-2 RNA is generally detectable in upper respiratory specimens during the acute phase of infection. The lowest concentration of SARS-CoV-2 viral copies this assay can detect is 138 copies/mL. A negative result does not preclude SARS-Cov-2 infection and should not be used as the sole basis for treatment or other patient management decisions. A negative result may occur with  improper specimen collection/handling, submission of specimen other than nasopharyngeal swab, presence of viral mutation(s) within the areas targeted by this assay, and inadequate number of viral copies(<138 copies/mL). A negative result must be combined with clinical observations, patient history, and epidemiological information. The expected result is Negative.  Fact Sheet for Patients:  EntrepreneurPulse.com.au  Fact Sheet for Healthcare Providers:   IncredibleEmployment.be  This test is no t yet approved or cleared by the [place] FDA and  has been authorized for detection and/or diagnosis of SARS-CoV-2 by FDA under an Emergency Use Authorization (EUA). This EUA will remain  in effect (meaning this test can be used) for the duration of the COVID-19 declaration under Section 564(b)(1) of the Act, 21 U.S.C.section 360bbb-3(b)(1), unless the authorization is terminated  or revoked sooner.       Influenza A by PCR NEGATIVE NEGATIVE Final   Influenza B by PCR NEGATIVE NEGATIVE Final    Comment: (NOTE) The Xpert Xpress SARS-CoV-2/FLU/RSV plus assay is intended as an aid in the diagnosis of influenza from Nasopharyngeal swab specimens and should not be used as a sole basis for treatment. Nasal washings and aspirates are unacceptable for Xpert Xpress SARS-CoV-2/FLU/RSV testing.  Fact Sheet for Patients: EntrepreneurPulse.com.au  Fact Sheet for Healthcare Providers: IncredibleEmployment.be  This test is not yet approved or cleared by the [place] FDA and has been authorized for detection and/or diagnosis of SARS-CoV-2 by FDA under an Emergency Use Authorization (EUA). This EUA will remain in effect (meaning this test can be used) for the duration of the COVID-19 declaration under Section 564(b)(1) of the Act, 21 U.S.C. section 360bbb-3(b)(1), unless the authorization is terminated or revoked.  Performed at [facility], [address] [place], [place] [address]     RADIOLOGY:  No results found.   CODE STATUS:     Code Status Orders  (From admission, onward)           Start     Ordered   02/12/21 0100  Full code  Continuous        02/12/21 0059  Code Status History     Date Active Date Inactive Code Status Order ID Comments User Context   12/18/2020 0515 01/26/2021 2052 Full Code [id number]  Athena Masse, MD ED    09/10/2018 0212 09/15/2018 1758 Full Code [id number]  Lamont Dowdy, NP Inpatient   09/26/2012 2221 10/02/2012 2135 Full Code [id number]  Orvan Falconer, MD Inpatient   11/13/2011 2332 11/15/2011 1536 Full Code [id number]  Lenis Noon, RN Inpatient        TOTAL TIME TAKING CARE OF THIS PATIENT: 40 minutes.    Fritzi Mandes M.D  [facility]    CC: Primary care physician; Janine Limbo, [place]

## 2021-02-14 NOTE — Discharge Instructions (Signed)
Keep log of your sugars at home °

## 2021-02-14 NOTE — TOC Progression Note (Addendum)
Transition of Care [facility]) - Progression Note    Patient Details  Name: [name] MRN: [id number] Date of Birth: 01/18/1976  [facility]) CM/SW Contact  Caryn Section, RN Phone Number: 02/14/2021, 1:14 PM  Clinical Narrative:   Patient did not qualify for CIR as per Vernona Rieger.  Patient will discharge to hotel, she is staying at [facility] with a roommate.  Patient accepts rolling walker, 3 n 1 and wheelchair, as recommended by PT.  At this time, attempting to acquire home health.  [facility] is unable to take patient due to patient having [employer].  Information provided to [place] at [facility], awaiting response.  Addendum: 1191 hours: Patient is returning home via EMSAddress[address], [place], [place] [address] Phone: [phone]   Unable to get home health as [facility] is unable, [facility] unable, [facility] unable, [facility] unable to take.  Patient is aware and will call insurance to set up [facility] in AM.      Expected Discharge Plan and Services           Expected Discharge Date: 02/14/21                                     Social Determinants of Health (SDOH) Interventions    Readmission Risk Interventions No flowsheet data found.

## 2021-02-15 LAB — GLUCOSE, CAPILLARY
Glucose-Capillary: 115 mg/dL — ABNORMAL HIGH (ref 70–99)
Glucose-Capillary: 272 mg/dL — ABNORMAL HIGH (ref 70–99)

## 2021-02-15 NOTE — TOC Progression Note (Signed)
Transition of Care [facility]) - Progression Note    Patient Details  Name: [name] MRN: [id number] Date of Birth: 08-11-1975  Transition of Care [facility]) CM/SW Contact  Caryn Section, RN Phone Number: 02/15/2021, 3:24 PM  Clinical Narrative:    Patient lives on the 2nd floor and has had this arrangement prior to admission.  Patient states that [facility] will not move her to the first floor.  Per Serafina Royals at [facility], patient and roomates are on 2nd floor due to smoking.  [facility] has just rented their last first floor room and is unable to transfer patient to first floor.  Discussed safety concerns about patient not being able to take stairs (hotel has no elevator) and ability to get to appointments and fill needs as well as emergency planning.  Discussed moving to another hotel.  Patient states her roommates have the room and she plans on returning to that particular room.  Patient requested EMS to transport her home.  EMS contacted 11/29 and 02/15/2021.  EMS had a high volume of transports and they were concerned about patient's 2nd floor residency.  EMS refused to transport patient multiple times for these reasons.  EMS recommends that if assistance is needed for transport up/down stairs, that patient/caregivers call 911 for assistance.  Patient and caregivers aware of all of the above discussions.  [facility] contacted about transport, they had no available trucks to transport.  [facility] transport notified, they were unable to secure a vendor to transport patient.    Discussed patient returning to SNF, [facility] is willing to accept patient upon return, however during last admission, patient's insurance was failing to pay and patient signed out of facility prior to a formal discharge.  Facility was unable to set patient up with Home Health or equipment.  Patient refuses returning to SNF, as there are copays and her check would have to be signed over to facility.  Patient refuses copays and refuses  to sign check over.  States she does not want to return to [place] and is unable to make financial arrangements with facilities, thus it is not possible to transfer to SNF at this time.  Patient states she would like to be discharged and will "find a way" to get upstairs, she was reluctant to discuss how, and then stated that "two large men will do the lifting."  Physical Therapy at bedside to instruct on safe transfer and evacuation if needed.  Patient and visitor verbalized understanding of explanation.  When asked about transport to appointments, patient states she does not plan to attend appointments.  Feliberto Gottron with [facility] set patient up with Telehealth, who will follow patient at home.  [facility], [facility], [facility] and [facility] all unable to follow patient due to insurance discrepancies.  Patient is aware, and states she plans to call insurance about this discrepancy and ask that they set her up with Home Health after dischare.  Patient has rolling walker, 3n1, delivered to room by [facility] on 11/29, wheelchair to be delivered to patient's home by [facility].  Patient states that she needs to return home immediately and will work on all logistics herself, understands telehealth and risks of being on 2nd floor.  Wanted to end discussion.  Understands she can call 911 for assisting with stairs.           Expected Discharge Plan and Services           Expected  Discharge Date: 02/15/21                                     Social Determinants of Health (SDOH) Interventions    Readmission Risk Interventions Readmission Risk Prevention Plan 02/14/2021  Transportation Screening Complete  Medication Review [facility]) Complete  PCP or Specialist appointment within 3-5 days of discharge Complete  HRI or Home Care Consult Complete  SW Recovery Care/Counseling Consult Not Complete  SW Consult Not Complete  Comments RNCM assigned to case  Palliative Care Screening Not Applicable  Skilled Nursing Facility Complete  Some recent data might be hidden

## 2021-02-15 NOTE — Care Management Important Message (Signed)
Important Message  Patient Details  Name: [name] MRN: [id number] Date of Birth: 09/29/1975   Medicare Important Message Given:  Yes     Olegario Messier A Mahala Rommel 02/15/2021, 9:40 AM

## 2021-02-15 NOTE — Consult Note (Signed)
[facility] Inpatient Consult   02/15/2021  JAYCE BOYKO Jun 12, 1975 [id number]  [facility] Care Management [facility] CM)   Patient was reviewed for less than 30 days readmission and extreme risk score for unplanned readmission. Assessed for post hospital transition of care needs for chronic disease management and care coordination with [facility] CM services.   Referral to [facility] CM for post hospital follow up.  Of note, [facility] Care Management services does not replace or interfere with any services that are arranged by inpatient case management or social work.   Christophe Louis, MSN, RN [facility] [employer] [phone]  Toll free office [phone]

## 2021-02-15 NOTE — Discharge Summary (Addendum)
[facility] [place] [facility]   PATIENT NAME: [name]    MR#:  [id number]  DATE OF BIRTH:  Apr 25, 1975  DATE OF ADMISSION:  02/11/2021 ADMITTING PHYSICIAN: Andris Baumann, MD  DATE OF DISCHARGE: 02/15/2021  PRIMARY CARE PHYSICIAN: O'Buch, Edgardo Roys, PA-C    ADMISSION DIAGNOSIS:  Weakness [R53.1] Frequent falls [R29.6] Fall, initial encounter [W19.XXXA]  DISCHARGE DIAGNOSIS:  Gen weakness with fall Dm-2 uncontrolled, hyperglycemia, dietary noncompliance SECONDARY DIAGNOSIS:   Past Medical History:  Diagnosis Date   Alcohol abuse    Anxiety    Anxiety    Anxiety and depression    Asthma    Bipolar disorder ([name])    Borderline personality disorder ([name])    Depression    Diabetes ([name])    Type 2    Dyslipidemia    Esophageal varices ([name])    Foot drop    right   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[name])    GERD (gastroesophageal reflux disease)    Gout    History of drug abuse ([name])    Insomnia    Muscular dystrophies ([name])    Neuropathy    Pancreatitis    Recovering alcoholic ([name])     HOSPITAL COURSE:   45 yo with hx bipolar, T2Dm, alcoholic cirrhosis, FSH muscular dystrophy with frequent falls, s/p recent prolonged hospitalization for aspiration pneumonia, sepsis, and hepatic encephalopathy presenting to the ED with persistent weakness and recurrent falls after being discharged from SNF.    Recurrent Falls  Generalized Weakness FSH muscular dystrophy --Recurrent falls after recent discharge from SNF --Discharged 11/10 from >1 month hospitalization for aspiration pneumonia, hepatic encephalopathy --Likely component of deconditioning from prolonged hospital stay --Head CT with head trauma --Negative plain films of right tib/fib, right wrist (no recent fx or dislocation - possible residual 1 mm smooth marginated calcificatioN), and right knee --PT/OT-- PT recommends acute rehab--TOC for d/c planning --CK wnl --CIR at [facility]  cone declined. Pt has no other option but d/c to [facility] with HH. D/w TOC lead Darl Pikes   Alcohol Abuse Denies etoh since recent admission CIWA--scoring low   Bipolar Prozac, zyprexa   Leukocytosis --Dr. Cathie Hoops was consulted at last hospital stay and she followed with them --No source of infection identified   Hyponatremia Resolved   T2DM, with hyperglycemia, obesity SSI  cont long and short acting insulin Non compliance to diet is a BIG issue   Alcoholic Liver Disease  Cirrhosis Continue to follow Lasix, spironolactone Lactulose, rifaximin   DVT prophylaxis: lovenox Code Status: full  Family Communication: none at bedside Disposition: to motel with HH Walker and 3-in-1 provided      Pt will discharge today   CONSULTS OBTAINED:    DRUG ALLERGIES:   Allergies  Allergen Reactions   Erythromycin Anaphylaxis   Sulfa Antibiotics Anaphylaxis   Tylenol [Acetaminophen] Other (See Comments)    Pancreatitis and liver dysfunction, not supposed to take med   Nsaids Other (See Comments)    Causes GI problems, very bad reaction-per patient.    Gabapentin     Mood changes     DISCHARGE MEDICATIONS:   Allergies as of 02/15/2021       Reactions   Erythromycin Anaphylaxis   Sulfa Antibiotics Anaphylaxis   Tylenol [acetaminophen] Other (See Comments)   Pancreatitis and liver dysfunction, not supposed to take med   Nsaids Other (See Comments)   Causes GI problems, very bad reaction-per patient.    Gabapentin    Mood changes  Medication List     STOP taking these medications    diphenoxylate-atropine 2.5-0.025 MG tablet Commonly known as: LOMOTIL   insulin aspart 100 UNIT/ML injection Commonly known as: novoLOG   LORazepam 0.5 MG tablet Commonly known as: ATIVAN   methocarbamol 500 MG tablet Commonly known as: ROBAXIN   naloxone 4 MG/0.1ML Liqd nasal spray kit Commonly known as: NARCAN   omeprazole 20 MG capsule Commonly known as: PRILOSEC    promethazine 25 MG suppository Commonly known as: PHENERGAN   promethazine 25 MG tablet Commonly known as: PHENERGAN       TAKE these medications    albuterol 108 (90 Base) MCG/ACT inhaler Commonly known as: VENTOLIN HFA Inhale 2 puffs into the lungs every 6 (six) hours as needed for wheezing.   atorvastatin 40 MG tablet Commonly known as: LIPITOR Take 1 tablet (40 mg total) by mouth daily.   budesonide 3 MG 24 hr capsule Commonly known as: ENTOCORT EC Take 9 mg by mouth daily.   calcium-vitamin D 500-200 MG-UNIT tablet Commonly known as: OSCAL WITH D Take 1 tablet by mouth daily with breakfast.   dicyclomine 20 MG tablet Commonly known as: BENTYL Take 20 mg by mouth 3 (three) times daily.   FLUoxetine 40 MG capsule Commonly known as: PROZAC Take 2 capsules (80 mg total) by mouth daily.   fluticasone 50 MCG/ACT nasal spray Commonly known as: FLONASE Place 1 spray into both nostrils daily.   folic acid 1 MG tablet Commonly known as: FOLVITE Take 1 tablet (1 mg total) by mouth daily.   furosemide 40 MG tablet Commonly known as: LASIX Take 1 tablet (40 mg total) by mouth daily.   insulin glargine-yfgn 100 UNIT/ML injection Commonly known as: SEMGLEE Inject 0.28 mLs (28 Units total) into the skin 2 (two) times daily. What changed: how much to take   insulin lispro 100 UNIT/ML injection Commonly known as: HUMALOG Inject 0.12 mLs (12 Units total) into the skin 3 (three) times daily with meals.   lactulose 10 GM/15ML solution Commonly known as: CHRONULAC Take 30 mLs (20 g total) by mouth 2 (two) times daily as needed for mild constipation.   magnesium oxide 400 (240 Mg) MG tablet Commonly known as: MAG-OX Take 1 tablet (400 mg total) by mouth 2 (two) times daily.   multivitamin with minerals Tabs tablet Take 1 tablet by mouth daily.   OLANZapine 5 MG tablet Commonly known as: ZYPREXA Take 1 tablet (5 mg total) by mouth at bedtime.   pantoprazole 40 MG  tablet Commonly known as: PROTONIX Take 1 tablet (40 mg total) by mouth daily.   pregabalin 150 MG capsule Commonly known as: LYRICA Take 1 capsule (150 mg total) by mouth 3 (three) times daily.   rifaximin 550 MG Tabs tablet Commonly known as: XIFAXAN Take 1 tablet (550 mg total) by mouth 2 (two) times daily.   spironolactone 100 MG tablet Commonly known as: ALDACTONE Take 1 tablet (100 mg total) by mouth daily.   thiamine 100 MG tablet Take 1 tablet (100 mg total) by mouth daily.               Durable Medical Equipment  (From admission, onward)           Start     Ordered   02/14/21 1309  For home use only DME lightweight manual wheelchair with seat cushion  Once       Comments: Patient suffers from [place] which impairs their ability to perform  daily activities like bathing, dressing, and grooming in the home.  A walker will not resolve  issue with performing activities of daily living. A wheelchair will allow patient to safely perform daily activities. Patient is not able to propel themselves in the home using a standard weight wheelchair due to general weakness. Patient can self propel in the lightweight wheelchair. Length of need Lifetime. Accessories: elevating leg rests (ELRs), wheel locks, extensions and anti-tippers.   02/14/21 1310   02/14/21 1307  DME Walker  Once       Question Answer Comment  Walker: With [place] Wheels   Patient needs a walker to treat with the following condition Weakness      02/14/21 1307   02/14/21 1307  DME 3-in-1  Once        02/14/21 1307   02/14/21 1300  For home use only DME Walker rolling  Once       Question Answer Comment  Walker: With [place]   Patient needs a walker to treat with the following condition Impaired ambulation      02/14/21 1259   02/14/21 1300  For home use only DME 3 n 1  Once        02/14/21 1300            If you experience worsening of your admission symptoms, develop  shortness of breath, life threatening emergency, suicidal or homicidal thoughts you must seek medical attention immediately by calling 911 or calling your MD immediately  if symptoms less severe.  You Must read complete instructions/literature along with all the possible adverse reactions/side effects for all the Medicines you take and that have been prescribed to you. Take any new Medicines after you have completely understood and accept all the possible adverse reactions/side effects.   Please note  You were cared for by a hospitalist during your hospital stay. If you have any questions about your discharge medications or the care you received while you were in the hospital after you are discharged, you can call the unit and asked to speak with the hospitalist on call if the hospitalist that took care of you is not available. Once you are discharged, your primary care physician will handle any further medical issues. Please note that NO REFILLS for any discharge medications will be authorized once you are discharged, as it is imperative that you return to your primary care physician (or establish a relationship with a primary care physician if you do not have one) for your aftercare needs so that they can reassess your need for medications and monitor your lab values. Today   SUBJECTIVE   No new complaints  VITAL SIGNS:  Blood pressure 131/70, pulse 67, temperature 98.1 F (36.7 C), temperature source Oral, resp. rate 16, height $RemoveBe'5\' 7"'yacqKYpBi$  (1.702 m), weight 92.5 kg, SpO2 96 %.  I/O:   Intake/Output Summary (Last 24 hours) at 02/15/2021 0825 Last data filed at 02/14/2021 1145 Gross per 24 hour  Intake --  Output 500 ml  Net -500 ml     PHYSICAL EXAMINATION:  GENERAL:  45 y.o.-year-old patient lying in the bed with no acute distress. Obese LUNGS: Normal breath sounds bilaterally, no wheezing, rales,rhonchi or crepitation. No use of accessory muscles of respiration.  CARDIOVASCULAR: S1, S2  normal. No murmurs, rubs, or gallops.  ABDOMEN: Soft, non-tender, non-distended. Bowel sounds present. No organomegaly or mass.  EXTREMITIES: No pedal edema, cyanosis, or clubbing.  NEUROLOGIC: non-focal PSYCHIATRIC:  patient is alert and  oriented x 3.  SKIN: No obvious rash, lesion, or ulcer.   DATA REVIEW:   CBC  Recent Labs  Lab 02/13/21 0436  WBC 12.1*  HGB 9.8*  HCT 31.9*  PLT 218     Chemistries  Recent Labs  Lab 02/13/21 0436  NA 140  K 3.6  CL 105  CO2 30  GLUCOSE 228*  BUN 17  CREATININE 0.64  CALCIUM 8.7*  MG 1.5*  AST 56*  ALT 14  ALKPHOS 231*  BILITOT 1.8*     Microbiology Results   Recent Results (from the past 240 hour(s))  Resp Panel by RT-PCR (Flu A&B, Covid) Nasopharyngeal Swab     Status: None   Collection Time: 02/12/21 12:44 AM   Specimen: Nasopharyngeal Swab; Nasopharyngeal(NP) swabs in vial transport medium  Result Value Ref Range Status   SARS Coronavirus 2 by RT PCR NEGATIVE NEGATIVE Final    Comment: (NOTE) SARS-CoV-2 target nucleic acids are NOT DETECTED.  The SARS-CoV-2 RNA is generally detectable in upper respiratory specimens during the acute phase of infection. The lowest concentration of SARS-CoV-2 viral copies this assay can detect is 138 copies/mL. A negative result does not preclude SARS-Cov-2 infection and should not be used as the sole basis for treatment or other patient management decisions. A negative result may occur with  improper specimen collection/handling, submission of specimen other than nasopharyngeal swab, presence of viral mutation(s) within the areas targeted by this assay, and inadequate number of viral copies(<138 copies/mL). A negative result must be combined with clinical observations, patient history, and epidemiological information. The expected result is Negative.  Fact Sheet for Patients:  EntrepreneurPulse.com.au  Fact Sheet for Healthcare Providers:   IncredibleEmployment.be  This test is no t yet approved or cleared by the [place] FDA and  has been authorized for detection and/or diagnosis of SARS-CoV-2 by FDA under an Emergency Use Authorization (EUA). This EUA will remain  in effect (meaning this test can be used) for the duration of the COVID-19 declaration under Section 564(b)(1) of the Act, 21 U.S.C.section 360bbb-3(b)(1), unless the authorization is terminated  or revoked sooner.       Influenza A by PCR NEGATIVE NEGATIVE Final   Influenza B by PCR NEGATIVE NEGATIVE Final    Comment: (NOTE) The Xpert Xpress SARS-CoV-2/FLU/RSV plus assay is intended as an aid in the diagnosis of influenza from Nasopharyngeal swab specimens and should not be used as a sole basis for treatment. Nasal washings and aspirates are unacceptable for Xpert Xpress SARS-CoV-2/FLU/RSV testing.  Fact Sheet for Patients: EntrepreneurPulse.com.au  Fact Sheet for Healthcare Providers: IncredibleEmployment.be  This test is not yet approved or cleared by the [place] FDA and has been authorized for detection and/or diagnosis of SARS-CoV-2 by FDA under an Emergency Use Authorization (EUA). This EUA will remain in effect (meaning this test can be used) for the duration of the COVID-19 declaration under Section 564(b)(1) of the Act, 21 U.S.C. section 360bbb-3(b)(1), unless the authorization is terminated or revoked.  Performed at [facility], [address] [place], [place] [address]     RADIOLOGY:  No results found.   CODE STATUS:     Code Status Orders  (From admission, onward)           Start     Ordered   02/12/21 0100  Full code  Continuous        02/12/21 0059           Code Status History  Date Active Date Inactive Code Status Order ID Comments User Context   12/18/2020 0515 01/26/2021 2052 Full Code [id number]  Athena Masse, MD ED    09/10/2018 0212 09/15/2018 1758 Full Code [id number]  Lamont Dowdy, NP Inpatient   09/26/2012 2221 10/02/2012 2135 Full Code [id number]  Orvan Falconer, MD Inpatient   11/13/2011 2332 11/15/2011 1536 Full Code [id number]  Lenis Noon, RN Inpatient        TOTAL TIME TAKING CARE OF THIS PATIENT: 40 minutes.    Fritzi Mandes M.D  [facility]    CC: Primary care physician; Janine Limbo, [place]

## 2021-02-17 ENCOUNTER — Other Ambulatory Visit: Payer: Self-pay | Admitting: *Deleted

## 2021-02-17 NOTE — Patient Outreach (Signed)
[facility]) Care Management  02/17/2021  [name] 03/31/1975 [id number]   [facility] Unsuccessful outreach   Outreach attempt to the listed at the preferred outreach number in EPIC  No answer. Voice mail box full  [facility] RN CM left HIPAA [facility] Portability and Accountability Act) compliant voicemail message along with CM's contact info via text   Plan: [facility] RN CM scheduled this patient for another call attempt within 4-7 business days Unsuccessful outreach letter sent on 02/17/21 Unsuccessful outreach on 02/17/21   [name] [name], RN, BSN, CCM [facility] Telephonic Care Management Care Coordinator Office number [phone] Mobile number [phone]  Main [facility] number [phone] Fax number [phone]

## 2021-02-20 ENCOUNTER — Other Ambulatory Visit: Payer: Self-pay | Admitting: *Deleted

## 2021-02-20 NOTE — Patient Outreach (Signed)
[facility]) Care Management  02/20/2021  [name] August 30, 1975 [id number]   [facility] second Unsuccessful outreach     Outreach attempt to the listed at the preferred outreach number in EPIC  No answer. Voice mail box full  [facility] RN CM left HIPAA [facility] Portability and Accountability Act) compliant voicemail message along with CM's contact info via text    Plan: [facility] RN CM scheduled this patient for another call attempt within 4-7 business days Unsuccessful outreach letter sent on 02/17/21 Unsuccessful outreach on 02/17/21., 02/20/21     [name] [name], RN, BSN, CCM [facility] Telephonic Care Management Care Coordinator Office number [phone] Mobile number [phone]  Main [facility] number [phone] Fax number [phone]

## 2021-02-23 ENCOUNTER — Other Ambulatory Visit: Payer: Self-pay | Admitting: *Deleted

## 2021-02-23 NOTE — Patient Outreach (Signed)
[facility]) Care Management  02/23/2021  [name] [birth year] [id number]   [facility] third Unsuccessful outreach     Outreach attempt to the listed at the preferred outreach number in EPIC [phone] No answer. Voice mail box full  [facility] RN CM left HIPAA [facility] Portability and Accountability Act) compliant voicemail message along with CM's contact info via text   RN CM found a scanned Designated Party Release (DPR) in EPIC listing her mother Selinda Michaels  RN CM outreached to Mrs Alto Denver who confirms she has not seen pt since her hospital discharge, aware of patient living on the second floor of a hotel but has spoken twice with her daughter twice since her hospital discharge to confirm she is okay. Mrs Alto Denver is on her way to a medical appointment but confirms she will provide the patient with RN CM office number for a possible return call. RN CM discussed also a letter mailed to the patient    Plan: [facility] RN CM scheduled this patient for another call attempt within 3 weeks prior to pending case closure Unsuccessful outreach letter sent on 02/17/21 Unsuccessful outreach on 02/17/21., 02/20/21, 02/23/21     [name] [name], RN, BSN, CCM [facility] Telephonic Care Management Care Coordinator Office number [phone] Mobile number [phone]  Main [facility] number [phone] Fax number [phone]

## 2021-03-10 ENCOUNTER — Other Ambulatory Visit: Payer: Self-pay | Admitting: *Deleted

## 2021-03-10 NOTE — Patient Outreach (Signed)
[facility]) Care Management  03/10/2021  SHAHED YEOMAN Aug 30, 1975 [id number]   [facility] fourth Unsuccessful outreach    RN CM receive a message from patient   Outreach attempt to the listed at the preferred outreach number in EPIC [phone] The phone was answered and disconnected x 2    RN CM  sent a text to this number to let pt know RN CM was attempting to follow up with her and requested a return call    Plan: [facility] RN CM scheduled this patient for pending case closure Unsuccessful outreach letter sent on 02/17/21 Unsuccessful outreach on 02/17/21., 02/20/21, 02/23/21, 03/10/21     [name] [name], RN, BSN, CCM [facility] Telephonic Care Management Care Coordinator Office number [phone] Mobile number [phone]  Main [facility] number [phone] Fax number [phone]

## 2021-03-16 ENCOUNTER — Ambulatory Visit: Payer: Self-pay | Admitting: *Deleted

## 2021-03-17 DIAGNOSIS — R531 Weakness: Secondary | ICD-10-CM | POA: Diagnosis not present

## 2021-04-10 ENCOUNTER — Other Ambulatory Visit: Payer: Self-pay | Admitting: *Deleted

## 2021-04-10 NOTE — Patient Outreach (Signed)
[facility]) Care Management  04/10/2021  [name] 1976-01-26 [id number]   [facility] Case closure -unable to establish contact  Mrs [name] was referred to [facility] on 02/15/21 post  regional hospitalization stay for screening for needs  Unsuccessful outreach on 02/17/21 voice box full., 02/20/21 voice box full , 02/23/21 voice box full- spoke with pt's mother, 03/10/21 (answered and disconnected calls x 2, text unanswered) Unsuccessful outreach letter sent on 02/17/21   Plan [facility] RN CM will close case after no response from patient within 10+ business days. Unable to reach Case closure letters sent to patient and MD  [name] [name], RN, BSN, CCM [facility] Telephonic Care Management Care Coordinator Office number [phone] Mobile number [phone]  Main [facility] number [phone] Fax number [phone]

## 2021-04-11 DIAGNOSIS — K766 Portal hypertension: Secondary | ICD-10-CM | POA: Diagnosis not present

## 2021-04-11 DIAGNOSIS — E1142 Type 2 diabetes mellitus with diabetic polyneuropathy: Secondary | ICD-10-CM | POA: Diagnosis not present

## 2021-04-11 DIAGNOSIS — D72829 Elevated white blood cell count, unspecified: Secondary | ICD-10-CM | POA: Diagnosis not present

## 2021-04-11 DIAGNOSIS — K7460 Unspecified cirrhosis of liver: Secondary | ICD-10-CM | POA: Diagnosis not present

## 2021-04-11 DIAGNOSIS — I272 Pulmonary hypertension, unspecified: Secondary | ICD-10-CM | POA: Diagnosis not present

## 2021-04-11 DIAGNOSIS — G629 Polyneuropathy, unspecified: Secondary | ICD-10-CM | POA: Diagnosis not present

## 2021-04-17 DIAGNOSIS — R531 Weakness: Secondary | ICD-10-CM | POA: Diagnosis not present

## 2021-05-16 DIAGNOSIS — R531 Weakness: Secondary | ICD-10-CM | POA: Diagnosis not present

## 2021-05-24 DIAGNOSIS — M109 Gout, unspecified: Secondary | ICD-10-CM | POA: Diagnosis not present

## 2021-05-24 DIAGNOSIS — J449 Chronic obstructive pulmonary disease, unspecified: Secondary | ICD-10-CM | POA: Diagnosis not present

## 2021-05-24 DIAGNOSIS — G629 Polyneuropathy, unspecified: Secondary | ICD-10-CM | POA: Diagnosis not present

## 2021-05-24 DIAGNOSIS — D649 Anemia, unspecified: Secondary | ICD-10-CM | POA: Diagnosis not present

## 2021-05-24 DIAGNOSIS — E785 Hyperlipidemia, unspecified: Secondary | ICD-10-CM | POA: Diagnosis not present

## 2021-05-24 DIAGNOSIS — K219 Gastro-esophageal reflux disease without esophagitis: Secondary | ICD-10-CM | POA: Diagnosis not present

## 2021-05-24 DIAGNOSIS — Z79899 Other long term (current) drug therapy: Secondary | ICD-10-CM | POA: Diagnosis not present

## 2021-05-24 DIAGNOSIS — K7460 Unspecified cirrhosis of liver: Secondary | ICD-10-CM | POA: Diagnosis not present

## 2021-05-24 DIAGNOSIS — E1142 Type 2 diabetes mellitus with diabetic polyneuropathy: Secondary | ICD-10-CM | POA: Diagnosis not present

## 2021-06-15 DIAGNOSIS — R531 Weakness: Secondary | ICD-10-CM | POA: Diagnosis not present

## 2021-06-19 DIAGNOSIS — G5690 Unspecified mononeuropathy of unspecified upper limb: Secondary | ICD-10-CM | POA: Diagnosis not present

## 2021-06-19 DIAGNOSIS — E1142 Type 2 diabetes mellitus with diabetic polyneuropathy: Secondary | ICD-10-CM | POA: Diagnosis not present

## 2021-06-19 DIAGNOSIS — R29898 Other symptoms and signs involving the musculoskeletal system: Secondary | ICD-10-CM | POA: Diagnosis not present

## 2021-07-05 ENCOUNTER — Ambulatory Visit: Payer: Medicare Other | Admitting: Physical Therapy

## 2021-07-10 ENCOUNTER — Ambulatory Visit: Payer: Medicare Other | Attending: Internal Medicine | Admitting: Physical Therapy

## 2021-07-10 ENCOUNTER — Encounter: Payer: Medicare Other | Admitting: Physical Therapy

## 2021-07-10 ENCOUNTER — Encounter: Payer: Self-pay | Admitting: Physical Therapy

## 2021-07-10 DIAGNOSIS — M6281 Muscle weakness (generalized): Secondary | ICD-10-CM | POA: Insufficient documentation

## 2021-07-10 DIAGNOSIS — R2689 Other abnormalities of gait and mobility: Secondary | ICD-10-CM | POA: Insufficient documentation

## 2021-07-10 DIAGNOSIS — Z9181 History of falling: Secondary | ICD-10-CM | POA: Diagnosis not present

## 2021-07-10 NOTE — Therapy (Addendum)
?OUTPATIENT PHYSICAL THERAPY EVALUATION ? ? ?Patient Name: [name] ?MRN: [id number] ?DOB:July 04, 1975, 46 y.o., female ?Today's Date: 07/11/2021 ? ? PT End of Session - 07/11/21 1310   ? ? Visit Number 1   ? Number of Visits 24   ? Date for PT Re-Evaluation 10/02/21   ? Authorization Type UHC MEDICARE/Medicaid reporting period from 07/10/2021   ? Progress Note Due on Visit 10   ? PT Start Time 1030   ? PT Stop Time 1115   ? PT Time Calculation (min) 45 min   ? Activity Tolerance Patient tolerated treatment well   ? Behavior During Therapy [facility] for tasks assessed/performed   ? ?  ?  ? ?  ? ? ?Past Medical History:  ?Diagnosis Date  ? Alcohol abuse   ? Anxiety   ? Anxiety   ? Anxiety and depression   ? Asthma   ? Bipolar disorder ([place])   ? Borderline personality disorder ([place])   ? Depression   ? Diabetes ([place])   ? Type 2   ? Dyslipidemia   ? Esophageal varices ([name])   ? Foot drop   ? right  ? 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 GERD (gastroesophageal reflux disease)   ? Gout   ? History of drug abuse ([place])   ? Insomnia   ? Muscular dystrophies ([place])   ? Neuropathy   ? Pancreatitis   ? Recovering alcoholic ([place])   ? ?Past Surgical History:  ?Procedure Laterality Date  ? HERNIA REPAIR    ? IR FLUORO GUIDE CV LINE RIGHT  12/30/2020  ? IR REMOVAL TUN CV CATH W/O FL  01/19/2021  ? IR US GUIDE VASC ACCESS RIGHT  12/30/2020  ? ?Patient Active Problem List  ? Diagnosis Date Noted  ? Frequent falls 02/12/2021  ? Leukocytosis   ? Aspiration pneumonia of both lower lobes due to gastric secretions ([place]) 01/04/2021  ? Pressure injury of skin 12/24/2020  ? Acute hepatic encephalopathy ([place]) 12/20/2020  ? Sepsis ([place]) 12/19/2020  ? Acute hyponatremia 12/18/2020  ? Alcohol use disorder, moderate, dependence ([place]) 12/18/2020  ? Hepatic steatosis 12/18/2020  ? Polysubstance abuse ([place]) 12/18/2020  ? Hyperglycemia due to type 2 diabetes mellitus ([place]) 12/18/2020  ? Hyperbilirubinemia 12/18/2020  ? Lactic acidosis  12/18/2020  ? Alcoholic liver disease ([place]) 12/18/2020  ? Bipolar 1 disorder, manic, moderate ([place]) 09/10/2018  ? 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 Diabetes ([place]) 09/10/2018  ? 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 Chronic gout of foot 09/29/2015  ? Muscular dystrophy ([place]) 09/29/2015  ? Type 2 diabetes mellitus without complication ([place]) [id number]  ? Anxiety and depression 10/01/2012  ? Protein-calorie malnutrition, severe ([place]) 09/30/2012  ? Alcohol abuse 09/26/2012  ? Trichomonas infection 09/26/2012  ? Dehydration 09/26/2012  ? Hypokalemia 11/14/2011  ? Thrombocytopenia ([place]) 11/14/2011  ? Alcohol withdrawal ([place]) 11/13/2011  ? Bipolar 2 disorder ([place]) 11/13/2011  ? Tobacco abuse 11/13/2011  ? Cocaine abuse ([place]) 11/13/2011  ? Gout 11/13/2011  ? ? ?PCP: Janine Limbo, PA-C ? ?REFERRING PROVIDER: Janine Limbo, PA-C ? ?REFERRING DIAG: bilateral leg weakness ? ?THERAPY DIAG:  ?Other abnormalities of gait and mobility ? ?Muscle weakness (generalized) ? ?History of falling ? ?ONSET DATE: September/October 2022 (with chronic issues for years) ? ?SUBJECTIVE:  ? ?SUBJECTIVE STATEMENT: ?Patient states she was in the hospital or in the SNF for a total of 61 days last Fall and she has been working to regain mobility ever since. She  was hospitalized for alcoholism and has [name]. Her hospital stay started December 17, 2020. She is currently staying with her cousin and a roommate [place] with 16 steps to get in. She is moving into a mobile home on 07/19/2021. She has been been going up and down the stairs for about the last 2 months, but she needs help to go down. Her balance was terrible before all of this happened. She has muscular dystrophy and she has "terrible" neuropathy, and she used to drink. She is sober now (7 months on 07/18/2021). She also had COVID19 and it accelerated her progression downhill (that had already started). She had some  trouble with her right hip that affected her but it doesn't seem to be bothering her now. She states she has pain in her arms and legs related to her muscular dystrophy that is worse when she has to pull herself up the stairs. She has been using a rolling walker since September 2022. She had not been using a cane or other assistive device normally prior to that.  Main thing she wants to work on is mobility and balance. She has fallen backwards, she trips on things sometimes. When she fell a week and a half ago she turned around too fast. R LE seems to be weaker and gives her more pain.  ? ?PERTINENT HISTORY: ?Patient is a 46 y.o. female who presents to outpatient physical therapy with a referral for medical diagnosis bilateral leg weakness. This patient's chief complaints consist of difficulty with weakness and balance with history of frequent falling leading to the following functional deficits: difficulty with basic mobility including ambulation, stairs, transfers; difficulty with ADLs, IADLs. Currently requires assistance to navigate stairs, assistance with ADLs and  IADLs, and ambulates with RW when she previously did not need an assistive device. Relevant past medical history and comorbidities include muscular dystrophy (FSHD - facioscapulohumeral muscular dystrophy), anxiety and depression, bipolar 1 disorder, borderline personality disorder, diabetes, GERD, gout, pancreatitis, alcoholic liver disease, tobacco abuse,  foot drop, neuropathy, recovering alcoholic, history of drug abuse, history of hernia repair.  Patient denies hx of cancer, stroke, seizures, lung problems, heart problems, unexplained weight loss, unexplained changes in bowel or bladder problems, unexplained stumbling or dropping things, osteoporosis, and spinal surgery ? ? ?PAIN:  ?Are you having pain? Yes: NPRS scale: Current: 4/10,  Best: 0/10, Worst: 8/10. ?Pain location: thighs down and arms ?Aggravating factors: feels related to muscular  dystrophy, a lot of use.  ?Relieving factors: biofreeze, massage, muscle relaxants ? ? ?FUNCTIONAL LIMITATIONS: difficulty with basic mobility including ambulation, stairs, transfers; difficulty with ADLs, IADLs. Currently requires assistance to navigate stairs, assistance with ADLs and  IADLs, and ambulates with RW when she previously did not need an assistive device.  ? ?PRECAUTIONS: Fall, not drink alcohol. ? ?WEIGHT BEARING RESTRICTIONS No ? ?FALLS:  ?Has patient fallen in last 6 months? Yes. Number of falls 1 . She had a lot of falls before due to "balance and booze." ? ?LIVING ENVIRONMENT: ?Lives with: lives with her cousin and another roommate but will only be her cousin shortly. She is not alone more than 2 hours at a time.  ?[place], will be moving to mobile home 07/19/2021.  ?Stairs: Yes: External: [place]: 16 steps; bilateral but cannot reach both. Mobile home: ~ 4 steps in, B handrails and can reach both. Small step to kitchen.  ?Has following equipment at home: [employer]ile - 2 wheeled, Wheelchair (  manual), and bed side commode. She used to have a carbon fiber AFO for R ankle but lost it when she moved. She did not like it.  ? ?OCCUPATION: on disability ? ?PLOF: Prior to September 2022 she did not ambulate with an AD except when her back was hurting. Patient required help with bathing and grooming, was independent with dressing.  ? ?PATIENT GOALS would like to be free of RW. Would like to be able to navigate stairs better. Improve balance.  ? ? ?OBJECTIVE:  ? ?COGNITION: ? Overall cognitive status: Within functional limits for tasks assessed   ?  ?OBSERVATION/INSPECTION ?Posture ?Posture (seated): forward head, rounded shoulders. ?Posture (standing): mildly stooped  ?Anthropometrics ?Tremor: none ?Muscle bulk: low in bilateral LE  ?Functional Mobility ?Bed mobility: not tested ?Transfers: sit <> stand mod I with UE support on plinth.  ?Gait: ambulate with bilateral steppage gait with heavy  weight bearing through B UE on RW.  ?Stairs: climbs up with left, down with left but is able to reverse this while using B UE support.  ? ? ?PERIPHERAL JOINT MOTION (in degrees) ?Passive Range of Motion (PROM)

## 2021-07-13 ENCOUNTER — Ambulatory Visit: Payer: Medicare Other | Admitting: Physical Therapy

## 2021-07-16 DIAGNOSIS — R531 Weakness: Secondary | ICD-10-CM | POA: Diagnosis not present

## 2021-07-17 ENCOUNTER — Encounter: Payer: Self-pay | Admitting: Physical Therapy

## 2021-07-17 ENCOUNTER — Ambulatory Visit: Payer: Medicare Other | Attending: Internal Medicine | Admitting: Physical Therapy

## 2021-07-17 VITALS — BP 141/95 | HR 117

## 2021-07-17 DIAGNOSIS — R2689 Other abnormalities of gait and mobility: Secondary | ICD-10-CM

## 2021-07-17 DIAGNOSIS — Z9181 History of falling: Secondary | ICD-10-CM | POA: Diagnosis not present

## 2021-07-17 DIAGNOSIS — M6281 Muscle weakness (generalized): Secondary | ICD-10-CM

## 2021-07-17 NOTE — Therapy (Addendum)
?OUTPATIENT PHYSICAL THERAPY TREATMENT NOTE ? ? ?Patient Name: [name] ?MRN: [id number] ?DOB:[birth year], 46 y.o., female ?Today's Date: 07/17/2021 ? ?PCP: Janine Limbo, PA-C ?REFERRING PROVIDER: Janine Limbo, PA-C ? ?END OF SESSION:  ? PT End of Session - 07/17/21 1212   ? ? Visit Number 2   ? Number of Visits 24   ? Date for PT Re-Evaluation 10/02/21   ? Authorization Type UHC MEDICARE/Medicaid reporting period from 07/10/2021   ? Progress Note Due on Visit 10   ? PT Start Time 1125   ? PT Stop Time 1203   ? PT Time Calculation (min) 38 min   ? Equipment Utilized During Treatment Gait belt   ? Activity Tolerance Patient tolerated treatment well   ? Behavior During Therapy [facility] for tasks assessed/performed   ? ?  ?  ? ?  ? ? ?Past Medical History:  ?Diagnosis Date  ? Alcohol abuse   ? Anxiety   ? Anxiety   ? Anxiety and depression   ? Asthma   ? Bipolar disorder ([place])   ? Borderline personality disorder ([place])   ? Depression   ? Diabetes ([place])   ? Type 2   ? Dyslipidemia   ? Esophageal varices ([name])   ? Foot drop   ? right  ? 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 GERD (gastroesophageal reflux disease)   ? Gout   ? History of drug abuse ([place])   ? Insomnia   ? Muscular dystrophies ([place])   ? Neuropathy   ? Pancreatitis   ? Recovering alcoholic ([place])   ? ?Past Surgical History:  ?Procedure Laterality Date  ? HERNIA REPAIR    ? IR FLUORO GUIDE CV LINE RIGHT  12/30/2020  ? IR REMOVAL TUN CV CATH W/O FL  01/19/2021  ? IR US GUIDE VASC ACCESS RIGHT  12/30/2020  ? ?Patient Active Problem List  ? Diagnosis Date Noted  ? Frequent falls 02/12/2021  ? Leukocytosis   ? Aspiration pneumonia of both lower lobes due to gastric secretions ([place]) 01/04/2021  ? Pressure injury of skin 12/24/2020  ? Acute hepatic encephalopathy ([place]) 12/20/2020  ? Sepsis ([place]) 12/19/2020  ? Acute hyponatremia 12/18/2020  ? Alcohol use disorder, moderate, dependence ([place]) 12/18/2020  ? Hepatic steatosis 12/18/2020  ? Polysubstance abuse  ([place]) 12/18/2020  ? Hyperglycemia due to type 2 diabetes mellitus ([place]) 12/18/2020  ? Hyperbilirubinemia 12/18/2020  ? Lactic acidosis 12/18/2020  ? Alcoholic liver disease ([place]) 12/18/2020  ? Bipolar 1 disorder, manic, moderate ([place]) 09/10/2018  ? 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 Diabetes ([place]) 09/10/2018  ? 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 Chronic gout of foot 09/29/2015  ? Muscular dystrophy ([place]) 09/29/2015  ? Type 2 diabetes mellitus without complication ([place]) [id number]  ? Anxiety and depression 10/01/2012  ? Protein-calorie malnutrition, severe ([place]) 09/30/2012  ? Alcohol abuse 09/26/2012  ? Trichomonas infection 09/26/2012  ? Dehydration 09/26/2012  ? Hypokalemia 11/14/2011  ? Thrombocytopenia ([place]) 11/14/2011  ? Alcohol withdrawal ([place]) 11/13/2011  ? Bipolar 2 disorder ([place]) 11/13/2011  ? Tobacco abuse 11/13/2011  ? Cocaine abuse ([place]) 11/13/2011  ? Gout 11/13/2011  ? ? ?REFERRING DIAG: bilateral leg weakness ? ?THERAPY DIAG:  ?Other abnormalities of gait and mobility ? ?Muscle weakness (generalized) ? ?History of falling ? ?PERTINENT HISTORY: Patient is a 46 y.o. female who presents to outpatient physical therapy with a referral for medical diagnosis bilateral leg weakness. This patient's chief complaints consist of difficulty with weakness  and balance with history of frequent falling leading to the following functional deficits: difficulty with basic mobility including ambulation, stairs, transfers; difficulty with ADLs, IADLs. Currently requires assistance to navigate stairs, assistance with ADLs and  IADLs, and ambulates with RW when she previously did not need an assistive device. Relevant past medical history and comorbidities include muscular dystrophy (FSHD - facioscapulohumeral muscular dystrophy), anxiety and depression, bipolar 1 disorder, borderline personality disorder, diabetes, GERD, gout, pancreatitis, alcoholic liver disease, tobacco abuse,  foot drop,  neuropathy, recovering alcoholic, history of drug abuse, history of hernia repair.  Patient denies hx of cancer, stroke, seizures, lung problems, heart problems, unexplained weight loss, unexplained changes in bowel or bladder problems, unexplained stumbling or dropping things, osteoporosis, and spinal surgery ? ?PRECAUTIONS: Fall, not drink alcohol. ? ?SUBJECTIVE: Patient reports she is late because she had some issues with unexpected traffic. She states she missed last PT session after she had an incidence on the stairs and fell partially down them. She was moderately sore. She has rested since then and hasn't done anything. She thinks she is okay. She remembered she has been having trouble with her right groin for some time and sometimes feels like it is "pulled." She felt it pulling at the right proximal medial thigh, at the distal right medial hamstring and medial calf. She was going up the stairs when she fell. She is not sure what happened when she fell but thinks she didn't have a good grip on the hand supports. Would like to re-visit starting HEP on Thursday.  ? ?PAIN:  ?Are you having pain? Yes: NPRS scale: Current: 6/10.  ?Neuropathy in legs and ankles.  ? ? ?OBJECTIVE:  ?   ?Vitals:  ? 07/17/21 1138  ?BP: (!) 141/95  ?Pulse: (!) 117  ?SpO2: 97%  ? Seated, right arm, adult automatic cuff ? ?SELF-REPORTED FUNCTION ?FOTO score: 46/100 (NOC-Neuromuscular disorder questionnaire) ? ?FUNCTIONAL/BALANCE TESTS ?6 Minute Walk Test: 310 feet with RW and steppage gait, R > L. Reports discomfort in B UE and low back.  ? ? ? ?TODAY'S TREATMENT: ?Therapeutic exercise: to centralize symptoms and improve ROM, strength, muscular endurance, and activity tolerance required for successful completion of functional activities.  ?- ambulation around clinic with RW for distance (see 6 minute walk test above).  ?- seated B ankle DF, 1x10 (reports significant fatigue on R after 2-3), 2x5.  ?- sit <> stand from 17 inch chair with B  UE support on knees, 2x5 ?Neuromuscular Re-education: to improve, balance, postural strength, muscle activation patterns, and stabilization strength required for functional activities: ?- standing self-selected to narrow stance on airex pad with intermittent UE support with horizontal head turns and CGA. 2x1 min.  ? ? ?PATIENT EDUCATION:  ?Education details: Exercise purpose/form. Self management techniques. Person educated: Patient ?Education method: Explanation, demonstration, verbal cuing, tactile cuing. ?Education comprehension: verbalized understanding, demonstrated understanding, and needs further education ?  ?  ?HOME EXERCISE PROGRAM: ?TBD ?  ?ASSESSMENT: ?  ?CLINICAL IMPRESSION: ?Patient tolerated treatment well overall with increase in fatigue but not discomfort by end of session. HR elevated this session and plan to monitor at subsequent sessions. Patient fatigued quickly and was provided with extra time for recovery. Plan to continue with functional strengthening and balance exercises as tolerated nest session.Patient would benefit from continued management of limiting condition by skilled physical therapist to address remaining impairments and functional limitations to work towards stated goals and return to PLOF or maximal functional independence.  ? ?Patient is a 46  y.o. female referred to outpatient physical therapy with a medical diagnosis of bilateral leg weakness who presents with signs and symptoms consistent with generalized weakness, balance deficits, and motor control problems likely related to deconditioning and FSHD muscular dystrophy. Patient presents with significant balance, motor control, posture, postural, gait, pain, muscle performance (strength/power/endurance) and activity tolerance impairments that are limiting ability to complete basic mobility and self care including ambulation, stairs, transfers, ADLs, and IADLs without difficulty. At eval requires assistance to navigate stairs,  assistance with ADLs and  IADLs, and ambulates with RW when she previously did not need an assistive device. Patient would likely benefit from bilateral AFOs to address foot drop found at both ankles

## 2021-07-20 ENCOUNTER — Encounter: Payer: Self-pay | Admitting: Physical Therapy

## 2021-07-20 ENCOUNTER — Ambulatory Visit: Payer: Medicare Other | Admitting: Physical Therapy

## 2021-07-20 DIAGNOSIS — Z9181 History of falling: Secondary | ICD-10-CM | POA: Diagnosis not present

## 2021-07-20 DIAGNOSIS — M6281 Muscle weakness (generalized): Secondary | ICD-10-CM

## 2021-07-20 DIAGNOSIS — R2689 Other abnormalities of gait and mobility: Secondary | ICD-10-CM | POA: Diagnosis not present

## 2021-07-20 NOTE — Therapy (Signed)
?OUTPATIENT PHYSICAL THERAPY TREATMENT NOTE ? ? ?Patient Name: [name] ?MRN: [id number] ?DOB:1975-05-05, 46 y.o., female ?Today's Date: 07/20/2021 ? ?PCP: Eunice Blase, PA-C ?REFERRING PROVIDER: Eunice Blase, PA-C ? ?END OF SESSION:  ? PT End of Session - 07/20/21 1131   ? ? Visit Number 3   ? Number of Visits 24   ? Date for PT Re-Evaluation 10/02/21   ? Authorization Type [employer] MEDICARE/Medicaid reporting period from 07/10/2021   ? Progress Note Due on Visit 10   ? PT Start Time 1124   ? PT Stop Time 1204   ? PT Time Calculation (min) 40 min   ? Equipment Utilized During Treatment Gait belt   ? Activity Tolerance Patient tolerated treatment well   ? Behavior During Therapy [facility] for tasks assessed/performed   ? ?  ?  ? ?  ? ? ? ?Past Medical History:  ?Diagnosis Date  ? Alcohol abuse   ? Anxiety   ? Anxiety   ? Anxiety and depression   ? Asthma   ? Bipolar disorder ([name])   ? Borderline personality disorder ([name])   ? Depression   ? Diabetes ([name])   ? Type 2   ? Dyslipidemia   ? Esophageal varices ([name])   ? Foot drop   ? right  ? 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[name])   ? GERD (gastroesophageal reflux disease)   ? Gout   ? History of drug abuse ([name])   ? Insomnia   ? Muscular dystrophies ([name])   ? Neuropathy   ? Pancreatitis   ? Recovering alcoholic ([name])   ? ?Past Surgical History:  ?Procedure Laterality Date  ? HERNIA REPAIR    ? IR FLUORO GUIDE CV LINE RIGHT  12/30/2020  ? IR REMOVAL TUN CV CATH W/O FL  01/19/2021  ? IR US GUIDE VASC ACCESS RIGHT  12/30/2020  ? ?Patient Active Problem List  ? Diagnosis Date Noted  ? Frequent falls 02/12/2021  ? Leukocytosis   ? Aspiration pneumonia of both lower lobes due to gastric secretions ([name]) 01/04/2021  ? Pressure injury of skin 12/24/2020  ? Acute hepatic encephalopathy ([name]) 12/20/2020  ? Sepsis ([name]) 12/19/2020  ? Acute hyponatremia 12/18/2020  ? Alcohol use disorder, moderate, dependence ([name]) 12/18/2020  ? Hepatic steatosis 12/18/2020  ? Polysubstance abuse  ([name]) 12/18/2020  ? Hyperglycemia due to type 2 diabetes mellitus ([name]) 12/18/2020  ? Hyperbilirubinemia 12/18/2020  ? Lactic acidosis 12/18/2020  ? Alcoholic liver disease ([name]) 12/18/2020  ? Bipolar 1 disorder, manic, moderate ([name]) 09/10/2018  ? 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 Diabetes ([name]) 09/10/2018  ? 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  ? Chronic gout of foot 09/29/2015  ? Muscular dystrophy ([name]) 09/29/2015  ? Type 2 diabetes mellitus without complication ([name]) 09/29/2015  ? Anxiety and depression 10/01/2012  ? Protein-calorie malnutrition, severe ([name]) 09/30/2012  ? Alcohol abuse 09/26/2012  ? Trichomonas infection 09/26/2012  ? Dehydration 09/26/2012  ? Hypokalemia 11/14/2011  ? Thrombocytopenia ([name]) 11/14/2011  ? Alcohol withdrawal ([name]) 11/13/2011  ? Bipolar 2 disorder ([name]) 11/13/2011  ? Tobacco abuse 11/13/2011  ? Cocaine abuse ([name]) 11/13/2011  ? Gout 11/13/2011  ? ? ?REFERRING DIAG: bilateral leg weakness ? ?THERAPY DIAG:  ?Other abnormalities of gait and mobility ? ?Muscle weakness (generalized) ? ?History of falling ? ?PERTINENT HISTORY: Patient is a 46 y.o. female who presents to outpatient physical therapy with a referral for medical diagnosis bilateral leg weakness. This patient's chief complaints consist of difficulty with  weakness and balance with history of frequent falling leading to the following functional deficits: difficulty with basic mobility including ambulation, stairs, transfers; difficulty with ADLs, IADLs. Currently requires assistance to navigate stairs, assistance with ADLs and  IADLs, and ambulates with RW when she previously did not need an assistive device. Relevant past medical history and comorbidities include muscular dystrophy (FSHD - facioscapulohumeral muscular dystrophy), anxiety and depression, bipolar 1 disorder, borderline personality disorder, diabetes, GERD, gout, pancreatitis, alcoholic liver disease, tobacco abuse,  foot drop,  neuropathy, recovering alcoholic, history of drug abuse, history of hernia repair.  Patient denies hx of cancer, stroke, seizures, lung problems, heart problems, unexplained weight loss, unexplained changes in bowel or bladder problems, unexplained stumbling or dropping things, osteoporosis, and spinal surgery ? ?PRECAUTIONS: Fall, not drink alcohol. ? ?SUBJECTIVE: Patient states she is feeling tired today after moving yesterday. She is sleeping on an air mattress on the floor now. She has figured out how to get up and down with help from her cousin. She states she rolled her right ankle last week going up stairs, which caused her to miss her 2nd PT appointment last week. She states her ankle is feeling better but still has some twinges.  ? ?PAIN:  ?Are you having pain? more tired than pain, no numeric rating provided.  ? ? ?OBJECTIVE:  ?  ?TODAY'S TREATMENT: ?Therapeutic exercise: to centralize symptoms and improve ROM, strength, muscular endurance, and activity tolerance required for successful completion of functional activities.  ?- NuStep level 1 using lower extremities. Seat/handle setting 9. For improved extremity mobility, muscular endurance, and activity tolerance; and to induce the analgesic effect of aerobic exercise, stimulate improved joint nutrition, and prepare body structures and systems for following interventions. x 7  minutes. Average SPM = 41. ?- sit <> stand from 17 inch chair with B UE support on knees, 4x5 (moderate difficulty). ?- standing lateral single step each direction with RTB around knees, 2x10 each way.  ?- Education on HEP including handout  ? ?Neuromuscular Re-education: to improve, balance, postural strength, muscle activation patterns, and stabilization strength required for functional activities: ?- standing alternating foot taps on 8.5 inch surface, with B UE support. 4x10 each side progressing to BUE to 1 finger each hand to taking hands off as much as possible.  ?- forward  sway on airex pad with self selected stance and UE support on TM bar as needed, 2x10 (strong fatigue in right calf by end of 2nd set).  ?- standing moderate stance on airex with trunk rotations while holding 2.2 lb med ball. SBA and Rail in front for safety if needed.  ?- ambulation forwards and backwards with U UE support on right progressing to no UE support (next to TM rail) with CGA, 2x5 each direction over 4 feet. ? ? ?PATIENT EDUCATION:  ?Education details: Exercise purpose/form. Self management techniques. Person educated: Patient ?Education method: Explanation, demonstration, verbal cuing, tactile cuing. ?Education comprehension: verbalized understanding, demonstrated understanding, and needs further education ?  ?  ?HOME EXERCISE PROGRAM: ?Access Code: [email or web address] ?URL: [email or web address] ?Date: 07/20/2021 ?Prepared by: Norton BlizzardSara Khalea Ventura ? ?Exercises ?- Sit to Stand  - 3-4 x weekly - 4 sets - 5 reps ?  ?ASSESSMENT: ?  ?CLINICAL IMPRESSION: ?Patient tolerated treatment well overall but was limited by quick fatigue and required many seated rest breaks. Session focused on moderate intensity exercises for improved balance and functional strength. Plan to continue with similar exercises next session with progressions as appropriate. Patient required close guarding and AD  to prevent falls and maintain safety. Patient would benefit from continued management of limiting condition by skilled physical therapist to address remaining impairments and functional limitations to work towards stated goals and return to PLOF or maximal functional independence.  ? ? ?Patient is a 46 y.o. female referred to outpatient physical therapy with a medical diagnosis of bilateral leg weakness who presents with signs and symptoms consistent with generalized weakness, balance deficits, and motor control problems likely related to deconditioning and FSHD muscular dystrophy. Patient presents with significant balance, motor  control, posture, postural, gait, pain, muscle performance (strength/power/endurance) and activity tolerance impairments that are limiting ability to complete basic mobility and self care including ambulation,

## 2021-07-24 ENCOUNTER — Ambulatory Visit: Payer: Medicare Other | Admitting: Physical Therapy

## 2021-07-24 ENCOUNTER — Encounter: Payer: Self-pay | Admitting: Physical Therapy

## 2021-07-24 DIAGNOSIS — Z9181 History of falling: Secondary | ICD-10-CM | POA: Diagnosis not present

## 2021-07-24 DIAGNOSIS — M6281 Muscle weakness (generalized): Secondary | ICD-10-CM | POA: Diagnosis not present

## 2021-07-24 DIAGNOSIS — R2689 Other abnormalities of gait and mobility: Secondary | ICD-10-CM

## 2021-07-24 NOTE — Therapy (Signed)
?OUTPATIENT PHYSICAL THERAPY TREATMENT NOTE ? ? ?Patient Name: [name] ?MRN: [id number] ?DOB:12/01/1975, 46 y.o., female ?Today's Date: 07/24/2021 ? ?PCP: Eunice Blase, PA-C ?REFERRING PROVIDER: Eunice Blase, PA-C ? ?END OF SESSION:  ? PT End of Session - 07/24/21 1034   ? ? Visit Number 4   ? Number of Visits 24   ? Date for PT Re-Evaluation 10/02/21   ? Authorization Type [employer] MEDICARE/Medicaid reporting period from 07/10/2021   ? Progress Note Due on Visit 10   ? PT Start Time 1033   ? PT Stop Time 1113   ? PT Time Calculation (min) 40 min   ? Equipment Utilized During Treatment Gait belt   ? Activity Tolerance Patient tolerated treatment well   ? Behavior During Therapy [facility] for tasks assessed/performed   ? ?  ?  ? ?  ? ? ? ? ?Past Medical History:  ?Diagnosis Date  ? Alcohol abuse   ? Anxiety   ? Anxiety   ? Anxiety and depression   ? Asthma   ? Bipolar disorder ([name])   ? Borderline personality disorder ([name])   ? Depression   ? Diabetes ([name])   ? Type 2   ? Dyslipidemia   ? Esophageal varices ([name])   ? Foot drop   ? right  ? 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[name])   ? GERD (gastroesophageal reflux disease)   ? Gout   ? History of drug abuse ([name])   ? Insomnia   ? Muscular dystrophies ([name])   ? Neuropathy   ? Pancreatitis   ? Recovering alcoholic ([name])   ? ?Past Surgical History:  ?Procedure Laterality Date  ? HERNIA REPAIR    ? IR FLUORO GUIDE CV LINE RIGHT  12/30/2020  ? IR REMOVAL TUN CV CATH W/O FL  01/19/2021  ? IR US GUIDE VASC ACCESS RIGHT  12/30/2020  ? ?Patient Active Problem List  ? Diagnosis Date Noted  ? Frequent falls 02/12/2021  ? Leukocytosis   ? Aspiration pneumonia of both lower lobes due to gastric secretions ([name]) 01/04/2021  ? Pressure injury of skin 12/24/2020  ? Acute hepatic encephalopathy ([name]) 12/20/2020  ? Sepsis ([name]) 12/19/2020  ? Acute hyponatremia 12/18/2020  ? Alcohol use disorder, moderate, dependence ([name]) 12/18/2020  ? Hepatic steatosis 12/18/2020  ? Polysubstance  abuse ([name]) 12/18/2020  ? Hyperglycemia due to type 2 diabetes mellitus ([name]) 12/18/2020  ? Hyperbilirubinemia 12/18/2020  ? Lactic acidosis 12/18/2020  ? Alcoholic liver disease ([name]) 12/18/2020  ? Bipolar 1 disorder, manic, moderate ([name]) 09/10/2018  ? 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 Diabetes ([name]) 09/10/2018  ? 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  ? Chronic gout of foot 09/29/2015  ? Muscular dystrophy ([name]) 09/29/2015  ? Type 2 diabetes mellitus without complication ([name]) 09/29/2015  ? Anxiety and depression 10/01/2012  ? Protein-calorie malnutrition, severe ([name]) 09/30/2012  ? Alcohol abuse 09/26/2012  ? Trichomonas infection 09/26/2012  ? Dehydration 09/26/2012  ? Hypokalemia 11/14/2011  ? Thrombocytopenia ([name]) 11/14/2011  ? Alcohol withdrawal ([name]) 11/13/2011  ? Bipolar 2 disorder ([name]) 11/13/2011  ? Tobacco abuse 11/13/2011  ? Cocaine abuse ([name]) 11/13/2011  ? Gout 11/13/2011  ? ? ?REFERRING DIAG: bilateral leg weakness ? ?THERAPY DIAG:  ?Other abnormalities of gait and mobility ? ?Muscle weakness (generalized) ? ?History of falling ? ?PERTINENT HISTORY: Patient is a 46 y.o. female who presents to outpatient physical therapy with a referral for medical diagnosis bilateral leg weakness. This patient's chief complaints consist of difficulty  with weakness and balance with history of frequent falling leading to the following functional deficits: difficulty with basic mobility including ambulation, stairs, transfers; difficulty with ADLs, IADLs. Currently requires assistance to navigate stairs, assistance with ADLs and  IADLs, and ambulates with RW when she previously did not need an assistive device. Relevant past medical history and comorbidities include muscular dystrophy (FSHD - facioscapulohumeral muscular dystrophy), anxiety and depression, bipolar 1 disorder, borderline personality disorder, diabetes, GERD, gout, pancreatitis, alcoholic liver disease, tobacco abuse,  foot  drop, neuropathy, recovering alcoholic, history of drug abuse, history of hernia repair.  Patient denies hx of cancer, stroke, seizures, lung problems, heart problems, unexplained weight loss, unexplained changes in bowel or bladder problems, unexplained stumbling or dropping things, osteoporosis, and spinal surgery ? ?PRECAUTIONS: Fall, not drink alcohol. ? ?SUBJECTIVE: Patient reports she has been really tired for the last week and her right LE has been "giving me a fit." She states she thinks it is related to moving and starting PT at the same time. She did her HEP but not as many sets as reccommended by PT but what she felt she could handle currently. She states she does not fee like she did too much at PT last session. She denies falls since last PT session. She arrives on her RW accompanied by her cousin to the waiting room.  ? ?PAIN:  ?Are you having pain? Yes, right glute to mid lateral thigh. NPRS 4/10. Mildly achy.  ? ? ?OBJECTIVE:  ?  ?TODAY'S TREATMENT: ?Therapeutic exercise: to centralize symptoms and improve ROM, strength, muscular endurance, and activity tolerance required for successful completion of functional activities.  ?- NuStep level 1 using lower extremities. Seat/handle setting 9. For improved extremity mobility, muscular endurance, and activity tolerance; and to induce the analgesic effect of aerobic exercise, stimulate improved joint nutrition, and prepare body structures and systems for following interventions. x 6 minutes. Average SPM = 71. ?- sit <> stand from 17 inch chair with B UE support on knees, 4x5 (moderate difficulty). ?- standing lateral single step each direction with RTB around knees, 2x10 each way.  ? ?Neuromuscular Re-education: to improve, balance, postural strength, muscle activation patterns, and stabilization strength required for functional activities: ?- forward sway on airex pad with self selected stance and UE support on TM bar as needed, 2x10. ?- standing  alternating foot taps on 8.5 inch surface, 3x10 each side progressing to no UE as able. SBA and near TM bar for safety.  ? ?Pt required multimodal cuing for proper technique and to facilitate improved neuromuscular control, strength, range of motion, and functional ability resulting in improved performance and form. ? ?PATIENT EDUCATION:  ?Education details: Exercise purpose/form. Self management techniques. Person educated: Patient ?Education method: Explanation, demonstration, verbal cuing, tactile cuing. ?Education comprehension: verbalized understanding, demonstrated understanding, and needs further education ?  ?  ?HOME EXERCISE PROGRAM: ?Access Code: [email or web address] ?URL: [email or web address] ?Date: 07/20/2021 ?Prepared by: Norton Blizzard ? ?Exercises ?- Sit to Stand  - 3-4 x weekly - 4 sets - 5 reps ?  ?ASSESSMENT: ?  ?CLINICAL IMPRESSION: ?Patient tolerated session with some difficulty due to quick fatigue. Patient required frequent rest breaks but demonstrated good participation and improved balance this session. Plan to continue with continued focus on function and balance as appropriate next session. Patient would benefit from continued management of limiting condition by skilled physical therapist to address remaining impairments and functional limitations to work towards stated goals and return to PLOF or maximal functional independence.  ? ?Patient  is a 46 y.o. female referred to outpatient physical therapy with a medical diagnosis of bilateral leg weakness who presents with signs and symptoms consistent with generalized weakness, balance deficits, and motor control problems likely related to deconditioning and FSHD muscular dystrophy. Patient presents with significant balance, motor control, posture, postural, gait, pain, muscle performance (strength/power/endurance) and activity tolerance impairments that are limiting ability to complete basic mobility and self care including ambulation, stairs,  transfers, ADLs, and IADLs without difficulty. At eval requires assistance to navigate stairs, assistance with ADLs and  IADLs, and ambulates with RW when she previously did not need an assistive device. Patient

## 2021-07-26 NOTE — Therapy (Signed)
?OUTPATIENT PHYSICAL THERAPY TREATMENT NOTE ? ? ?Patient Name: [name] ?MRN: [id number] ?DOB:1975/11/20, 46 y.o., female ?Today's Date: 07/27/2021 ? ?PCP: Eunice Blase, PA-C ?REFERRING PROVIDER: Eunice Blase, PA-C ? ?END OF SESSION:  ? PT End of Session - 07/27/21 1137   ? ? Visit Number 5   ? Number of Visits 24   ? Date for PT Re-Evaluation 10/02/21   ? Authorization Type [employer] MEDICARE/Medicaid reporting period from 07/10/2021   ? Progress Note Due on Visit 10   ? PT Start Time 1125   ? PT Stop Time 1205   ? PT Time Calculation (min) 40 min   ? Equipment Utilized During Treatment Gait belt   ? Activity Tolerance Patient tolerated treatment well   ? Behavior During Therapy [facility] for tasks assessed/performed   ? ?  ?  ? ?  ? ? ? ? ? ?Past Medical History:  ?Diagnosis Date  ? Alcohol abuse   ? Anxiety   ? Anxiety   ? Anxiety and depression   ? Asthma   ? Bipolar disorder ([name])   ? Borderline personality disorder ([name])   ? Depression   ? Diabetes ([name])   ? Type 2   ? Dyslipidemia   ? Esophageal varices ([name])   ? Foot drop   ? right  ? 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[name])   ? GERD (gastroesophageal reflux disease)   ? Gout   ? History of drug abuse ([name])   ? Insomnia   ? Muscular dystrophies ([name])   ? Neuropathy   ? Pancreatitis   ? Recovering alcoholic ([name])   ? ?Past Surgical History:  ?Procedure Laterality Date  ? HERNIA REPAIR    ? IR FLUORO GUIDE CV LINE RIGHT  12/30/2020  ? IR REMOVAL TUN CV CATH W/O FL  01/19/2021  ? IR US GUIDE VASC ACCESS RIGHT  12/30/2020  ? ?Patient Active Problem List  ? Diagnosis Date Noted  ? Frequent falls 02/12/2021  ? Leukocytosis   ? Aspiration pneumonia of both lower lobes due to gastric secretions ([name]) 01/04/2021  ? Pressure injury of skin 12/24/2020  ? Acute hepatic encephalopathy ([name]) 12/20/2020  ? Sepsis ([name]) 12/19/2020  ? Acute hyponatremia 12/18/2020  ? Alcohol use disorder, moderate, dependence ([name]) 12/18/2020  ? Hepatic steatosis 12/18/2020  ? Polysubstance  abuse ([name]) 12/18/2020  ? Hyperglycemia due to type 2 diabetes mellitus ([name]) 12/18/2020  ? Hyperbilirubinemia 12/18/2020  ? Lactic acidosis 12/18/2020  ? Alcoholic liver disease ([name]) 12/18/2020  ? Bipolar 1 disorder, manic, moderate ([name]) 09/10/2018  ? 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 Diabetes ([name]) 09/10/2018  ? 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  ? Chronic gout of foot 09/29/2015  ? Muscular dystrophy ([name]) 09/29/2015  ? Type 2 diabetes mellitus without complication ([name]) 09/29/2015  ? Anxiety and depression 10/01/2012  ? Protein-calorie malnutrition, severe ([name]) 09/30/2012  ? Alcohol abuse 09/26/2012  ? Trichomonas infection 09/26/2012  ? Dehydration 09/26/2012  ? Hypokalemia 11/14/2011  ? Thrombocytopenia ([name]) 11/14/2011  ? Alcohol withdrawal ([name]) 11/13/2011  ? Bipolar 2 disorder ([name]) 11/13/2011  ? Tobacco abuse 11/13/2011  ? Cocaine abuse ([name]) 11/13/2011  ? Gout 11/13/2011  ? ? ?REFERRING DIAG: bilateral leg weakness ? ?THERAPY DIAG:  ?Other abnormalities of gait and mobility ? ?Muscle weakness (generalized) ? ?History of falling ? ?PERTINENT HISTORY: Patient is a 46 y.o. female who presents to outpatient physical therapy with a referral for medical diagnosis bilateral leg weakness. This patient's chief complaints consist of  difficulty with weakness and balance with history of frequent falling leading to the following functional deficits: difficulty with basic mobility including ambulation, stairs, transfers; difficulty with ADLs, IADLs. Currently requires assistance to navigate stairs, assistance with ADLs and  IADLs, and ambulates with RW when she previously did not need an assistive device. Relevant past medical history and comorbidities include muscular dystrophy (FSHD - facioscapulohumeral muscular dystrophy), anxiety and depression, bipolar 1 disorder, borderline personality disorder, diabetes, GERD, gout, pancreatitis, alcoholic liver disease, tobacco abuse,  foot  drop, neuropathy, recovering alcoholic, history of drug abuse, history of hernia repair.  Patient denies hx of cancer, stroke, seizures, lung problems, heart problems, unexplained weight loss, unexplained changes in bowel or bladder problems, unexplained stumbling or dropping things, osteoporosis, and spinal surgery ? ?PRECAUTIONS: Fall, not drink alcohol. ? ?SUBJECTIVE: Patient reports she is very tired today. She states her air mattress has a large leak in it so she has spent the last two day sleeping on pillows on the floor. She states she is getting shooting pains from the right to the left hip and right shoulder to left shoulder and right low back is in a moderate amount of pain. She states her new home is bigger than her new home and she has noticed she needs more stamina to get around that she feels she is deficient in. She feels like she recovered from PT after last session.  ? ?PAIN:  ?Are you having pain? Yes, see above. NPRS: 5-7/10 when she has shooting pains. 3/10 otherwise.  ?  ?OBJECTIVE ? ?SELF-REPORTED FUNCTION ?FOTO score: 45/100 (NOC-Neuromuscular disorder questionnaire) ? ?TODAY'S TREATMENT: ?Therapeutic exercise: to centralize symptoms and improve ROM, strength, muscular endurance, and activity tolerance required for successful completion of functional activities.  ?- NuStep level 1 using lower extremities. Seat/handle setting 9. For improved extremity mobility, muscular endurance, and activity tolerance; and to induce the analgesic effect of aerobic exercise, stimulate improved joint nutrition, and prepare body structures and systems for following interventions. x 7 minutes. Average SPM = 77. ?- sit <> stand from 17 inch chair with B UE support on knees, 4x5 (moderate difficulty). ?- standing lateral single step each direction with RTB around knees, 2x10 each way.  ? ?Neuromuscular Re-education: to improve, balance, postural strength, muscle activation patterns, and stabilization strength  required for functional activities: ?- forward sway on airex pad with self selected stance and UE support on TM bar as needed, 2x10. ?- standing alternating foot taps on 8.5 inch surface, 3x10 each side progressing to no UE as able. SBA and near TM bar for safety.  ? ?Pt required multimodal cuing for proper technique and to facilitate improved neuromuscular control, strength, range of motion, and functional ability resulting in improved performance and form. ? ?PATIENT EDUCATION:  ?Education details: Exercise purpose/form. Self management techniques. Person educated: Patient ?Education method: Explanation, demonstration, verbal cuing, tactile cuing. ?Education comprehension: verbalized understanding, demonstrated understanding, and needs further education ?  ?  ?HOME EXERCISE PROGRAM: ?Access Code: [email or web address] ?URL: [email or web address] ?Date: 07/20/2021 ?Prepared by: Norton BlizzardSara Sylvan Lahm ? ?Exercises ?- Sit to Stand  - 3-4 x weekly - 4 sets - 5 reps ?  ?ASSESSMENT: ?  ?CLINICAL IMPRESSION: ?Patient arrives extra tired and with increased pain today since not having a mattress to sleep on for the last few days. Patient was able to complete exercises with sufficient rest and supervision. Plan to continue with moderate intensity exercise for balance and function as appropriate. Patient would benefit from continued management of limiting condition by  skilled physical therapist to address remaining impairments and functional limitations to work towards stated goals and return to PLOF or maximal functional independence.  ? ?Patient is a 46 y.o. female referred to outpatient physical therapy with a medical diagnosis of bilateral leg weakness who presents with signs and symptoms consistent with generalized weakness, balance deficits, and motor control problems likely related to deconditioning and FSHD muscular dystrophy. Patient presents with significant balance, motor control, posture, postural, gait, pain, muscle  performance (strength/power/endurance) and activity tolerance impairments that are limiting ability to complete basic mobility and self care including ambulation, stairs, transfers, ADLs, and IADLs without diff

## 2021-07-27 ENCOUNTER — Encounter: Payer: Self-pay | Admitting: Physical Therapy

## 2021-07-27 ENCOUNTER — Ambulatory Visit: Payer: Medicare Other | Admitting: Physical Therapy

## 2021-07-27 DIAGNOSIS — R2689 Other abnormalities of gait and mobility: Secondary | ICD-10-CM

## 2021-07-27 DIAGNOSIS — M6281 Muscle weakness (generalized): Secondary | ICD-10-CM | POA: Diagnosis not present

## 2021-07-27 DIAGNOSIS — Z9181 History of falling: Secondary | ICD-10-CM | POA: Diagnosis not present

## 2021-07-27 NOTE — Therapy (Incomplete)
?OUTPATIENT PHYSICAL THERAPY TREATMENT NOTE ? ? ?Patient Name: [name] ?MRN: [id number] ?DOB:1975/11/26, 46 y.o., female ?Today's Date: 07/27/2021 ? ?PCP: Eunice Blase, PA-C ?REFERRING PROVIDER: Eunice Blase, PA-C ? ?END OF SESSION:  ? ? ? ? ? ? ?Past Medical History:  ?Diagnosis Date  ? Alcohol abuse   ? Anxiety   ? Anxiety   ? Anxiety and depression   ? Asthma   ? Bipolar disorder ([name])   ? Borderline personality disorder ([name])   ? Depression   ? Diabetes ([name])   ? Type 2   ? Dyslipidemia   ? Esophageal varices ([name])   ? Foot drop   ? right  ? 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[name])   ? GERD (gastroesophageal reflux disease)   ? Gout   ? History of drug abuse ([name])   ? Insomnia   ? Muscular dystrophies ([name])   ? Neuropathy   ? Pancreatitis   ? Recovering alcoholic ([name])   ? ?Past Surgical History:  ?Procedure Laterality Date  ? HERNIA REPAIR    ? IR FLUORO GUIDE CV LINE RIGHT  12/30/2020  ? IR REMOVAL TUN CV CATH W/O FL  01/19/2021  ? IR US GUIDE VASC ACCESS RIGHT  12/30/2020  ? ?Patient Active Problem List  ? Diagnosis Date Noted  ? Frequent falls 02/12/2021  ? Leukocytosis   ? Aspiration pneumonia of both lower lobes due to gastric secretions ([name]) 01/04/2021  ? Pressure injury of skin 12/24/2020  ? Acute hepatic encephalopathy ([name]) 12/20/2020  ? Sepsis ([name]) 12/19/2020  ? Acute hyponatremia 12/18/2020  ? Alcohol use disorder, moderate, dependence ([name]) 12/18/2020  ? Hepatic steatosis 12/18/2020  ? Polysubstance abuse ([name]) 12/18/2020  ? Hyperglycemia due to type 2 diabetes mellitus ([name]) 12/18/2020  ? Hyperbilirubinemia 12/18/2020  ? Lactic acidosis 12/18/2020  ? Alcoholic liver disease ([name]) 12/18/2020  ? Bipolar 1 disorder, manic, moderate ([name]) 09/10/2018  ? 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 Diabetes ([name]) 09/10/2018  ? 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  ? Chronic gout of foot 09/29/2015  ? Muscular dystrophy ([name]) 09/29/2015  ? Type 2 diabetes mellitus  without complication ([name]) 09/29/2015  ? Anxiety and depression 10/01/2012  ? Protein-calorie malnutrition, severe ([name]) 09/30/2012  ? Alcohol abuse 09/26/2012  ? Trichomonas infection 09/26/2012  ? Dehydration 09/26/2012  ? Hypokalemia 11/14/2011  ? Thrombocytopenia ([name]) 11/14/2011  ? Alcohol withdrawal ([name]) 11/13/2011  ? Bipolar 2 disorder ([name]) 11/13/2011  ? Tobacco abuse 11/13/2011  ? Cocaine abuse ([name]) 11/13/2011  ? Gout 11/13/2011  ? ? ?REFERRING DIAG: bilateral leg weakness ? ?THERAPY DIAG:  ?No diagnosis found. ? ?PERTINENT HISTORY: Patient is a 46 y.o. female who presents to outpatient physical therapy with a referral for medical diagnosis bilateral leg weakness. This patient's chief complaints consist of difficulty with weakness and balance with history of frequent falling leading to the following functional deficits: difficulty with basic mobility including ambulation, stairs, transfers; difficulty with ADLs, IADLs. Currently requires assistance to navigate stairs, assistance with ADLs and  IADLs, and ambulates with RW when she previously did not need an assistive device. Relevant past medical history and comorbidities include muscular dystrophy (FSHD - facioscapulohumeral muscular dystrophy), anxiety and depression, bipolar 1 disorder, borderline personality disorder, diabetes, GERD, gout, pancreatitis, alcoholic liver disease, tobacco abuse,  foot drop, neuropathy, recovering alcoholic, history of drug abuse, history of hernia repair.  Patient denies hx of cancer, stroke, seizures, lung problems, heart problems, unexplained weight loss, unexplained changes in bowel or bladder problems,  unexplained stumbling or dropping things, osteoporosis, and spinal surgery ? ?PRECAUTIONS: Fall, not drink alcohol. ? ?SUBJECTIVE: Patient reports she is very tired today. She states her air mattress has a large leak in it so she has spent the last two day sleeping on pillows on the floor. She states she is getting  shooting pains from the right to the left hip and right shoulder to left shoulder and right low back is in a moderate amount of pain. She states her new home is bigger than her new home and she has noticed she needs more stamina to get around that she feels she is deficient in. She feels like she recovered from PT after last session.  ? ?PAIN:  ?Are you having pain? Yes, see above. NPRS: 5-7/10 when she has shooting pains. 3/10 otherwise.  ?  ?OBJECTIVE ? ?SELF-REPORTED FUNCTION ?FOTO score: 45/100 (NOC-Neuromuscular disorder questionnaire) ? ?TODAY'S TREATMENT: ?Therapeutic exercise: to centralize symptoms and improve ROM, strength, muscular endurance, and activity tolerance required for successful completion of functional activities.  ?- NuStep level 1 using lower extremities. Seat/handle setting 9. For improved extremity mobility, muscular endurance, and activity tolerance; and to induce the analgesic effect of aerobic exercise, stimulate improved joint nutrition, and prepare body structures and systems for following interventions. x 7 minutes. Average SPM = 77. ?- sit <> stand from 17 inch chair with B UE support on knees, 4x5 (moderate difficulty). ?- standing lateral single step each direction with RTB around knees, 2x10 each way.  ? ?Neuromuscular Re-education: to improve, balance, postural strength, muscle activation patterns, and stabilization strength required for functional activities: ?- forward sway on airex pad with self selected stance and UE support on TM bar as needed, 2x10. ?- standing alternating foot taps on 8.5 inch surface, 3x10 each side progressing to no UE as able. SBA and near TM bar for safety.  ? ?Pt required multimodal cuing for proper technique and to facilitate improved neuromuscular control, strength, range of motion, and functional ability resulting in improved performance and form. ? ?PATIENT EDUCATION:  ?Education details: Exercise purpose/form. Self management techniques. Person  educated: Patient ?Education method: Explanation, demonstration, verbal cuing, tactile cuing. ?Education comprehension: verbalized understanding, demonstrated understanding, and needs further education ?  ?  ?HOME EXERCISE PROGRAM: ?Access Code: [email or web address] ?URL: [email or web address] ?Date: 07/20/2021 ?Prepared by: Norton BlizzardSara Marielena Harvell ? ?Exercises ?- Sit to Stand  - 3-4 x weekly - 4 sets - 5 reps ?  ?ASSESSMENT: ?  ?CLINICAL IMPRESSION: ?Patient arrives extra tired and with increased pain today since not having a mattress to sleep on for the last few days. Patient was able to complete exercises with sufficient rest and supervision. Plan to continue with moderate intensity exercise for balance and function as appropriate. Patient would benefit from continued management of limiting condition by skilled physical therapist to address remaining impairments and functional limitations to work towards stated goals and return to PLOF or maximal functional independence.  ? ?Patient is a 46 y.o. female referred to outpatient physical therapy with a medical diagnosis of bilateral leg weakness who presents with signs and symptoms consistent with generalized weakness, balance deficits, and motor control problems likely related to deconditioning and FSHD muscular dystrophy. Patient presents with significant balance, motor control, posture, postural, gait, pain, muscle performance (strength/power/endurance) and activity tolerance impairments that are limiting ability to complete basic mobility and self care including ambulation, stairs, transfers, ADLs, and IADLs without difficulty. At eval requires assistance to navigate stairs, assistance with ADLs and  IADLs, and ambulates  with RW when she previously did not need an assistive device. Patient would likely benefit from bilateral AFOs to address foot drop found at both ankles and improve her steppage gait. Patient will benefit from skilled physical therapy intervention to  address current body structure impairments and activity limitations to improve function and work towards goals set in current POC in order to return to prior level of function or maximal functional improvement.  ?

## 2021-07-31 ENCOUNTER — Inpatient Hospital Stay: Payer: Medicare Other | Attending: Oncology

## 2021-07-31 ENCOUNTER — Encounter: Payer: Medicare Other | Admitting: Physical Therapy

## 2021-07-31 ENCOUNTER — Ambulatory Visit: Payer: Medicare Other | Admitting: Physical Therapy

## 2021-07-31 DIAGNOSIS — F17210 Nicotine dependence, cigarettes, uncomplicated: Secondary | ICD-10-CM | POA: Diagnosis not present

## 2021-07-31 DIAGNOSIS — D72829 Elevated white blood cell count, unspecified: Secondary | ICD-10-CM | POA: Insufficient documentation

## 2021-07-31 DIAGNOSIS — K709 Alcoholic liver disease, unspecified: Secondary | ICD-10-CM | POA: Insufficient documentation

## 2021-07-31 LAB — COMPREHENSIVE METABOLIC PANEL
ALT: 14 U/L (ref 0–44)
AST: 28 U/L (ref 15–41)
Albumin: 3.3 g/dL — ABNORMAL LOW (ref 3.5–5.0)
Alkaline Phosphatase: 98 U/L (ref 38–126)
Anion gap: 5 (ref 5–15)
BUN: 21 mg/dL — ABNORMAL HIGH (ref 6–20)
CO2: 24 mmol/L (ref 22–32)
Calcium: 9.2 mg/dL (ref 8.9–10.3)
Chloride: 102 mmol/L (ref 98–111)
Creatinine, Ser: 0.73 mg/dL (ref 0.44–1.00)
GFR, Estimated: >60 mL/min (ref >60)
Glucose, Bld: 219 mg/dL — ABNORMAL HIGH (ref 70–99)
Potassium: 4.2 mmol/L (ref 3.5–5.1)
Sodium: 131 mmol/L — ABNORMAL LOW (ref 135–145)
Total Bilirubin: 0.5 mg/dL (ref 0.3–1.2)
Total Protein: 7.6 g/dL (ref 6.5–8.1)

## 2021-07-31 LAB — CBC WITH DIFFERENTIAL/PLATELET
Abs Immature Granulocytes: 0.02 10*3/uL (ref 0.00–0.07)
Basophils Absolute: 0.1 10*3/uL (ref 0.0–0.1)
Basophils Relative: 1 %
Eosinophils Absolute: 0.3 10*3/uL (ref 0.0–0.5)
Eosinophils Relative: 3 %
HCT: 39.6 % (ref 36.0–46.0)
Hemoglobin: 13 g/dL (ref 12.0–15.0)
Immature Granulocytes: 0 %
Lymphocytes Relative: 37 %
Lymphs Abs: 3.1 10*3/uL (ref 0.7–4.0)
MCH: 28.1 pg (ref 26.0–34.0)
MCHC: 32.8 g/dL (ref 30.0–36.0)
MCV: 85.7 fL (ref 80.0–100.0)
Monocytes Absolute: 0.4 10*3/uL (ref 0.1–1.0)
Monocytes Relative: 5 %
Neutro Abs: 4.6 10*3/uL (ref 1.7–7.7)
Neutrophils Relative %: 54 %
Platelets: 180 10*3/uL (ref 150–400)
RBC: 4.62 MIL/uL (ref 3.87–5.11)
RDW: 14.2 % (ref 11.5–15.5)
WBC: 8.6 10*3/uL (ref 4.0–10.5)
nRBC: 0 % (ref 0.0–0.2)

## 2021-07-31 LAB — VITAMIN B12: Vitamin B-12: 343 pg/mL (ref 180–914)

## 2021-07-31 LAB — FOLATE: Folate: 20.3 ng/mL (ref >5.9)

## 2021-08-02 ENCOUNTER — Encounter: Payer: Self-pay | Admitting: Oncology

## 2021-08-02 ENCOUNTER — Inpatient Hospital Stay (HOSPITAL_BASED_OUTPATIENT_CLINIC_OR_DEPARTMENT_OTHER): Payer: Medicare Other | Admitting: Oncology

## 2021-08-02 VITALS — BP 147/92 | HR 107 | Wt 191.0 lb

## 2021-08-02 DIAGNOSIS — D72829 Elevated white blood cell count, unspecified: Secondary | ICD-10-CM | POA: Diagnosis not present

## 2021-08-02 DIAGNOSIS — K709 Alcoholic liver disease, unspecified: Secondary | ICD-10-CM | POA: Diagnosis not present

## 2021-08-02 DIAGNOSIS — F17210 Nicotine dependence, cigarettes, uncomplicated: Secondary | ICD-10-CM | POA: Diagnosis not present

## 2021-08-02 NOTE — Progress Notes (Signed)
? ?Hematology/Oncology Progress note ?Telephone:([phone]) [phone] [phone]) [phone] ?  ? ? ?Patient Care Team: ?O'Buch, Edgardo RoysGreta, PA-C as PCP - General (Internal Medicine)  ? ?Name of the patient: [name]  ?[id number]  ?[birth year]  ? ?REASON FOR VISIT ?Post hospitalization follow up ? ?INTERVAL HISTORY ?Patient was seen by me during recent for leukocytosis.  She had some blood work done which were still pending at the time of discharge.  Patient presents to follow-up ?Patient reports feeling well.  She was accompanied by her cousin. ?She has been sober for 8 months.  No new complaints today. ?Review of Systems  ?Constitutional:  Negative for appetite change, chills, fatigue and fever.  ?HENT:   Negative for hearing loss and voice change.   ?Eyes:  Negative for eye problems.  ?Respiratory:  Negative for chest tightness and cough.   ?Cardiovascular:  Negative for chest pain.  ?Gastrointestinal:  Negative for abdominal distention, abdominal pain and blood in stool.  ?Endocrine: Negative for hot flashes.  ?Genitourinary:  Negative for difficulty urinating and frequency.   ?Musculoskeletal:  Negative for arthralgias.  ?Skin:  Negative for itching and rash.  ?Neurological:  Negative for extremity weakness.  ?Hematological:  Negative for adenopathy.  ?Psychiatric/Behavioral:  Negative for confusion.    ? ? ?Allergies  ?Allergen Reactions  ? Erythromycin Anaphylaxis  ? Sulfa Antibiotics Anaphylaxis  ? Tylenol [Acetaminophen] Other (See Comments)  ?  Pancreatitis and liver dysfunction, not supposed to take med  ? Nsaids Other (See Comments)  ?  Causes GI problems, very bad reaction-per patient.   ? Gabapentin   ?  Mood changes   ? ? ? ?Past Medical History:  ?Diagnosis Date  ? Alcohol abuse   ? Anxiety   ? Anxiety   ? Anxiety and depression   ? Asthma   ? Bipolar disorder ([name])   ? Borderline personality disorder ([name])   ? Depression   ? Diabetes ([name])   ? Type 2   ? Dyslipidemia   ? Esophageal varices ([name])   ? Foot drop   ?  right  ? 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[name])   ? GERD (gastroesophageal reflux disease)   ? Gout   ? History of drug abuse ([name])   ? Insomnia   ? Muscular dystrophies ([name])   ? Neuropathy   ? Pancreatitis   ? Recovering alcoholic ([name])   ? ? ? ?Past Surgical History:  ?Procedure Laterality Date  ? HERNIA REPAIR    ? IR FLUORO GUIDE CV LINE RIGHT  12/30/2020  ? IR REMOVAL TUN CV CATH W/O FL  01/19/2021  ? IR US GUIDE VASC ACCESS RIGHT  12/30/2020  ? ? ?Social History  ? ?Socioeconomic History  ? Marital status: Legally Separated  ?  Spouse name: Not on file  ? Number of children: Not on file  ? Years of education: Not on file  ? Highest education level: Not on file  ?Occupational History  ? Occupation: disabled  ?Tobacco Use  ? Smoking status: Every Day  ?  Packs/day: 0.50  ?  Types: Cigarettes  ? Smokeless tobacco: Never  ?Substance and Sexual Activity  ? Alcohol use: Yes  ? Drug use: Yes  ?  Types: IV, Cocaine  ? Sexual activity: Yes  ?  Birth control/protection: None  ?Other Topics Concern  ? Not on file  ?Social History Narrative  ? Not on file  ? ?Social Determinants of Health  ? ?Financial Resource Strain: Not on file  ?Food Insecurity:  Not on file  ?Transportation Needs: Not on file  ?Physical Activity: Not on file  ?Stress: Not on file  ?Social Connections: Not on file  ?Intimate Partner Violence: Not on file  ? ? ?Family History  ?Problem Relation Age of Onset  ? Heart disease Mother   ? Heart attack Mother   ? Hyperlipidemia Maternal Grandmother   ? Diabetes Maternal Grandmother   ? Hypertension Maternal Grandmother   ? Hyperlipidemia Maternal Grandfather   ? Heart disease Maternal Grandfather   ? Heart attack Maternal Grandfather   ? Diabetes Maternal Grandfather   ? Lung cancer Maternal Grandfather   ? Hypertension Maternal Grandfather   ? Other Paternal Grandmother   ?     Thyroid problems   ? Heart disease Paternal Grandfather   ? Stroke Paternal Grandfather   ? Hyperlipidemia Paternal  Grandfather   ? Heart attack Paternal Grandfather   ? Hypertension Paternal Grandfather   ? ? ? ?Current Outpatient Medications:  ?  b complex vitamins capsule, Take 1 capsule by mouth daily., Disp: , Rfl:  ?  Mirabegron (MYRBETRIQ PO), Take by mouth., Disp: , Rfl:  ?  pregabalin (LYRICA) 150 MG capsule, Take 1 capsule (150 mg total) by mouth 3 (three) times daily., Disp: 30 capsule, Rfl: 0 ?  promethazine (PHENERGAN) 50 MG tablet, Take 50 mg by mouth every 6 (six) hours as needed for nausea or vomiting., Disp: , Rfl:  ?  thiamine 100 MG tablet, Take 1 tablet (100 mg total) by mouth daily., Disp: , Rfl:  ?  albuterol (VENTOLIN HFA) 108 (90 Base) MCG/ACT inhaler, Inhale 2 puffs into the lungs every 6 (six) hours as needed for wheezing. (Patient not taking: Reported on 08/02/2021), Disp: 6.7 g, Rfl: 1 ?  atorvastatin (LIPITOR) 40 MG tablet, Take 1 tablet (40 mg total) by mouth daily. (Patient not taking: Reported on 08/02/2021), Disp: 30 tablet, Rfl: 2 ?  budesonide (ENTOCORT EC) 3 MG 24 hr capsule, Take 9 mg by mouth daily. (Patient not taking: Reported on 08/02/2021), Disp: , Rfl:  ?  calcium-vitamin D (OSCAL WITH D) 500-200 MG-UNIT tablet, Take 1 tablet by mouth daily with breakfast. (Patient not taking: Reported on 08/02/2021), Disp: 30 tablet, Rfl: 1 ?  dicyclomine (BENTYL) 20 MG tablet, Take 20 mg by mouth 3 (three) times daily. (Patient not taking: Reported on 08/02/2021), Disp: , Rfl:  ?  FLUoxetine (PROZAC) 40 MG capsule, Take 2 capsules (80 mg total) by mouth daily. (Patient not taking: Reported on 08/02/2021), Disp: 30 capsule, Rfl: 1 ?  fluticasone (FLONASE) 50 MCG/ACT nasal spray, Place 1 spray into both nostrils daily. (Patient not taking: Reported on 08/02/2021), Disp: 9.9 mL, Rfl: 1 ?  folic acid (FOLVITE) 1 MG tablet, Take 1 tablet (1 mg total) by mouth daily. (Patient not taking: Reported on 08/02/2021), Disp: 30 tablet, Rfl: 1 ?  furosemide (LASIX) 40 MG tablet, Take 1 tablet (40 mg total) by mouth daily.  (Patient not taking: Reported on 08/02/2021), Disp: 30 tablet, Rfl: 1 ?  insulin glargine-yfgn (SEMGLEE) 100 UNIT/ML injection, Inject 0.28 mLs (28 Units total) into the skin 2 (two) times daily. (Patient not taking: Reported on 08/02/2021), Disp: 10 mL, Rfl: 3 ?  insulin lispro (HUMALOG) 100 UNIT/ML injection, Inject 0.12 mLs (12 Units total) into the skin 3 (three) times daily with meals. (Patient not taking: Reported on 08/02/2021), Disp: 10 mL, Rfl: 3 ?  lactulose (CHRONULAC) 10 GM/15ML solution, Take 30 mLs (20 g total) by mouth 2 (  two) times daily as needed for mild constipation. (Patient not taking: Reported on 08/02/2021), Disp: 236 mL, Rfl: 1 ?  magnesium oxide (MAG-OX) 400 (240 Mg) MG tablet, Take 1 tablet (400 mg total) by mouth 2 (two) times daily. (Patient not taking: Reported on 08/02/2021), Disp: , Rfl:  ?  methocarbamol (ROBAXIN) 500 MG tablet, Take 500 mg by mouth 3 (three) times daily as needed., Disp: , Rfl:  ?  Multiple Vitamin (MULTIVITAMIN WITH MINERALS) TABS tablet, Take 1 tablet by mouth daily. (Patient not taking: Reported on 08/02/2021), Disp: 30 tablet, Rfl: 1 ?  OLANZapine (ZYPREXA) 5 MG tablet, Take 1 tablet (5 mg total) by mouth at bedtime. (Patient not taking: Reported on 08/02/2021), Disp: 30 tablet, Rfl: 0 ?  pantoprazole (PROTONIX) 40 MG tablet, Take 1 tablet (40 mg total) by mouth daily. (Patient not taking: Reported on 08/02/2021), Disp: 30 tablet, Rfl: 2 ?  rifaximin (XIFAXAN) 550 MG TABS tablet, Take 1 tablet (550 mg total) by mouth 2 (two) times daily. (Patient not taking: Reported on 08/02/2021), Disp: 60 tablet, Rfl: 1 ?  spironolactone (ALDACTONE) 100 MG tablet, Take 1 tablet (100 mg total) by mouth daily. (Patient not taking: Reported on 08/02/2021), Disp: 30 tablet, Rfl: 1 ? ?Physical exam:  ?Vitals:  ? 08/02/21 1409 08/02/21 1416 08/02/21 1419  ?BP:  (!) 146/100 (!) 147/92  ?Pulse:  (!) 107   ?SpO2:  94%   ?Weight: 191 lb (86.6 kg)    ? ?Physical Exam ?Constitutional:   ?    General: She is not in acute distress. ?   Appearance: She is obese.  ?   Comments: Patient sits in the wheelchair  ?HENT:  ?   Head: Normocephalic and atraumatic.  ?Eyes:  ?   General: No scleral icterus. ?Set designer

## 2021-08-03 ENCOUNTER — Ambulatory Visit: Payer: Medicare Other | Admitting: Physical Therapy

## 2021-08-03 ENCOUNTER — Encounter: Payer: Self-pay | Admitting: Physical Therapy

## 2021-08-03 DIAGNOSIS — R2689 Other abnormalities of gait and mobility: Secondary | ICD-10-CM

## 2021-08-03 DIAGNOSIS — M6281 Muscle weakness (generalized): Secondary | ICD-10-CM

## 2021-08-03 DIAGNOSIS — Z9181 History of falling: Secondary | ICD-10-CM | POA: Diagnosis not present

## 2021-08-03 NOTE — Therapy (Addendum)
OUTPATIENT PHYSICAL THERAPY TREATMENT NOTE   Patient Name: [name] MRN: [id number] DOB:1975-11-30, 46 y.o., female Today's Date: 08/03/2021  PCP: Eunice Blase, PA-C REFERRING PROVIDER: Eunice Blase, PA-C  END OF SESSION:   PT End of Session - 08/03/21 1311     Visit Number 6    Number of Visits 24    Date for PT Re-Evaluation 10/02/21    Authorization Type [employer] MEDICARE/Medicaid reporting period from 07/10/2021    Progress Note Due on Visit 10    PT Start Time 1304    PT Stop Time 1342    PT Time Calculation (min) 38 min    Equipment Utilized During Treatment Gait belt    Activity Tolerance Patient tolerated treatment well    Behavior During Therapy WFL for tasks assessed/performed                 Past Medical History:  Diagnosis Date   Alcohol abuse    Anxiety    Anxiety    Anxiety and depression    Asthma    Bipolar disorder ([name])    Borderline personality disorder ([name])    Depression    Diabetes ([name])    Type 2    Dyslipidemia    Esophageal varices ([name])    Foot drop    right   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[name])    GERD (gastroesophageal reflux disease)    Gout    History of drug abuse ([name])    Insomnia    Muscular dystrophies ([name])    Neuropathy    Pancreatitis    Recovering alcoholic ([name])    Past Surgical History:  Procedure Laterality Date   HERNIA REPAIR     IR FLUORO GUIDE CV LINE RIGHT  12/30/2020   IR REMOVAL TUN CV CATH W/O FL  01/19/2021   IR US GUIDE VASC ACCESS RIGHT  12/30/2020   Patient Active Problem List   Diagnosis Date Noted   Frequent falls 02/12/2021   Leukocytosis    Aspiration pneumonia of both lower lobes due to gastric secretions ([name]) 01/04/2021   Pressure injury of skin 12/24/2020   Acute hepatic encephalopathy ([name]) 12/20/2020   Sepsis ([name]) 12/19/2020   Acute hyponatremia 12/18/2020   Alcohol use disorder, moderate, dependence ([name]) 12/18/2020   Hepatic steatosis 12/18/2020    Polysubstance abuse ([name]) 12/18/2020   Hyperglycemia due to type 2 diabetes mellitus ([name]) 12/18/2020   Hyperbilirubinemia 12/18/2020   Lactic acidosis 12/18/2020   Alcoholic liver disease ([name]) 12/18/2020   Bipolar 1 disorder, manic, moderate ([name]) 09/10/2018   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   Diabetes ([name]) 09/10/2018   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   Chronic gout of foot 09/29/2015   Muscular dystrophy ([name]) 09/29/2015   Type 2 diabetes mellitus without complication ([name]) 09/29/2015   Anxiety and depression 10/01/2012   Protein-calorie malnutrition, severe ([name]) 09/30/2012   Alcohol abuse 09/26/2012   Trichomonas infection 09/26/2012   Dehydration 09/26/2012   Hypokalemia 11/14/2011   Thrombocytopenia ([name]) 11/14/2011   Alcohol withdrawal ([name]) 11/13/2011   Bipolar 2 disorder ([name]) 11/13/2011   Tobacco abuse 11/13/2011   Cocaine abuse ([name]) 11/13/2011   Gout 11/13/2011     REFERRING DIAG: bilateral leg weakness  THERAPY DIAG:  Other abnormalities of gait and mobility  Muscle weakness (generalized)  History of falling  Rationale for Evaluation and Treatment Rehabilitation  PERTINENT HISTORY: Patient is a 46 y.o. female who presents to outpatient physical therapy with a referral for medical diagnosis  bilateral leg weakness. This patient's chief complaints consist of difficulty with weakness and balance with history of frequent falling leading to the following functional deficits: difficulty with basic mobility including ambulation, stairs, transfers; difficulty with ADLs, IADLs. Currently requires assistance to navigate stairs, assistance with ADLs and  IADLs, and ambulates with RW when she previously did not need an assistive device. Relevant past medical history and comorbidities include muscular dystrophy (FSHD - facioscapulohumeral muscular dystrophy), anxiety and depression, bipolar 1 disorder, borderline personality disorder, diabetes,  GERD, gout, pancreatitis, alcoholic liver disease, tobacco abuse,  foot drop, neuropathy, recovering alcoholic, history of drug abuse, history of hernia repair.  Patient denies hx of cancer, stroke, seizures, lung problems, heart problems, unexplained weight loss, unexplained changes in bowel or bladder problems, unexplained stumbling or dropping things, osteoporosis, and spinal surgery  PRECAUTIONS: Fall, not drink alcohol.  SUBJECTIVE: Patient reports she had a doctor's appointment yesterday where they had been concerned about something serious like cancer. They recommended she continue with gastroenterology but felt they did not need oncology any longer. Patient missed her last PT session after having a lot of pain [place] She state she is still sleeping on the floor or a lounge chair (like what one would sit in on the deck). She states she had a lot of hip pain last night when she laid down. She states she is planning to call her doctor again due to feeling some edema in the right LE. She is here with her RW accompanied to the waiting room by her cousin. Patient states she was sore after her last PT session. She did sit <> stands on Saturday.   PAIN:  Are you having pain? Yes. NPRS: 3/10 at R shoulder and R hip.    OBJECTIVE  TODAY'S TREATMENT: Therapeutic exercise: to centralize symptoms and improve ROM, strength, muscular endurance, and activity tolerance required for successful completion of functional activities.  - NuStep level 1 using lower extremities. Seat/handle setting 9. For improved extremity mobility, muscular endurance, and activity tolerance; and to induce the analgesic effect of aerobic exercise, stimulate improved joint nutrition, and prepare body structures and systems for following interventions. x 6 minutes. Average SPM = 86. - sit <> stand from 17 inch chair with B UE support on knees, 4x5 (moderate difficulty). -  standing lateral single step each direction with RTB around knees, 2x10 each way.   Neuromuscular Re-education: to improve, balance, postural strength, muscle activation patterns, and stabilization strength required for functional activities: - forward sway on airex pad with self selected stance and UE support on TM bar as needed, 2x10. - standing alternating foot taps on 8.5 inch surface, 3x10 each side progressing to no UE as able. SBA and near TM bar for safety.  - 365 degree turning alternating sides, 1x5 each way. SBA - narrow stance horizontal head rotation, 10 on each side. SBA  Pt required multimodal cuing for proper technique and to facilitate improved neuromuscular control, strength, range of motion, and functional ability resulting in improved performance and form. Requires CGA - SBA for balance exercises.   PATIENT EDUCATION:  Education details: Exercise purpose/form. Self management techniques. Person educated: Patient Education method: Explanation, demonstration, verbal cuing, tactile cuing. Education comprehension: verbalized understanding, demonstrated understanding, and needs further education     HOME EXERCISE PROGRAM: Access Code: [email or web address] URL: [email or web address] Date: 07/20/2021 Prepared by: Norton BlizzardSara Raymondo Garcialopez  Exercises - Sit to Stand  - 3-4 x weekly -  4 sets - 5 reps   ASSESSMENT:   CLINICAL IMPRESSION: Patient tolerated treatment well with some increased discomfort in the right LE with standing exercises. Continued to need frequent breaks due to quick fatigue. Plan to continue with functional strengthening and balance exercises as appropriate. Patient would benefit from continued management of limiting condition by skilled physical therapist to address remaining impairments and functional limitations to work towards stated goals and return to PLOF or maximal functional independence.   Patient is a 46 y.o. female referred to outpatient physical therapy  with a medical diagnosis of bilateral leg weakness who presents with signs and symptoms consistent with generalized weakness, balance deficits, and motor control problems likely related to deconditioning and FSHD muscular dystrophy. Patient presents with significant balance, motor control, posture, postural, gait, pain, muscle performance (strength/power/endurance) and activity tolerance impairments that are limiting ability to complete basic mobility and self care including ambulation, stairs, transfers, ADLs, and IADLs without difficulty. At eval requires assistance to navigate stairs, assistance with ADLs and  IADLs, and ambulates with RW when she previously did not need an assistive device. Patient would likely benefit from bilateral AFOs to address foot drop found at both ankles and improve her steppage gait. Patient will benefit from skilled physical therapy intervention to address current body structure impairments and activity limitations to improve function and work towards goals set in current POC in order to return to prior level of function or maximal functional improvement.      OBJECTIVE IMPAIRMENTS Abnormal gait, decreased activity tolerance, decreased balance, decreased coordination, decreased endurance, decreased mobility, difficulty walking, decreased ROM, decreased strength, impaired perceived functional ability, increased muscle spasms, impaired tone, impaired UE functional use, improper body mechanics, postural dysfunction, and pain.    ACTIVITY LIMITATIONS cleaning, community activity, driving, meal prep, laundry, and shopping.    PERSONAL FACTORS Behavior pattern, Fitness, Past/current experiences, Time since onset of injury/illness/exacerbation, Transportation, and 3+ comorbidities:    muscular dystrophy (FSHD - facioscapulohumeral muscular dystrophy), anxiety and depression, bipolar 1 disorder, borderline personality disorder, diabetes, GERD, gout, pancreatitis, alcoholic liver  disease, tobacco abuse,  foot drop, neuropathy, recovering alcoholic, history of drug abuse, history of hernia repair are also affecting patient's functional outcome.      REHAB POTENTIAL: Good   CLINICAL DECISION MAKING: Evolving/moderate complexity   EVALUATION COMPLEXITY: Moderate     GOALS: Goals reviewed with patient? No   SHORT TERM GOALS: Target date: 07/24/2021   Patient will be independent with initial home exercise program for self-management of symptoms. Baseline: Initial HEP to be provided at visit 2 as appropriate (07/10/21); Goal status: In Progress     LONG TERM GOALS: Target date: 10/02/2021   Patient will be independent with a long-term home exercise program for self-management of symptoms.  Baseline: Initial HEP to be provided at visit 2 as appropriate (07/10/21); Goal status: In-Progress   2.  Patient will demonstrate improved FOTO score by 10 points from baseline to demonstrate improvement in overall condition and self-reported functional ability.  Baseline: to be measured visit 2 as appropriate (07/10/21); Goal status: In-Progress   3.  Patient will ambulate equal or greater than 1000 feet during 6 Min Walk Test using [facility] or less restrictive assistive device to improve community ambulation.  Baseline: To be tested visit 2 as appropriate (07/10/21); 310 feet with RW (07/17/2021); Goal status: In-Progress   4.  Patient will complete 5 Time Sit To Stand test in equal or less than 20 seconds with no UE support from  18.5 inch plinth to demonstrate improved LE functional strength and power for basic mobility and improved fall risk.  Baseline: 24 seconds with B UE on 18.5 inch plinth.  (07/10/21); Goal status: In-Progress   5.  Patient be able to ascend and descend stairs at her home mod I with use of B handrails.  Baseline: able to ascend/descend 4 steps in the clinic with B UE support on handrails and supervision - reports currently needs assistance to descend steps  at home. (07/10/21); Goal status: In-Progress   6.  Patient will demonstrate ability to perform static balance in tandem stance for equal or greater than 30 seconds on each side, to demonstrate improved balance and decreased fall risk.  Baseline: right foot front 10 seconds, left foot front 7 seconds (07/10/2021);  Goal status: In-Progress     PLAN: PT FREQUENCY: 2x/week   PT DURATION: 12 weeks   PLANNED INTERVENTIONS: Therapeutic exercises, Therapeutic activity, Neuromuscular re-education, Balance training, Gait training, Patient/Family education, Joint mobilization, Orthotic/Fit training, DME instructions, Dry Needling, Electrical stimulation, Cryotherapy, Moist heat, Splintting, Taping, and Manual therapy   PLAN FOR NEXT SESSION: continue with moderate intensity functional strengthening and balance exercises.     [name]. Ilsa Iha, PT, DPT 08/03/21, 1:45 PM  [facility] [address] [place], [place] [address] P: [phone] I F: [phone]

## 2021-08-07 ENCOUNTER — Ambulatory Visit: Payer: Medicare Other | Admitting: Physical Therapy

## 2021-08-10 NOTE — Therapy (Signed)
OUTPATIENT PHYSICAL THERAPY TREATMENT NOTE   Patient Name: [name] MRN: [id number] DOB:1975-04-27, 46 y.o., female Today's Date: 08/15/2021  PCP: Janine Limbo, PA-C REFERRING PROVIDER: Janine Limbo, PA-C  END OF SESSION:   PT End of Session - 08/15/21 1359     Visit Number 7    Number of Visits 24    Date for PT Re-Evaluation 10/02/21    Authorization Type [employer] MEDICARE/Medicaid reporting period from 07/10/2021    Progress Note Due on Visit 10    PT Start Time 1347    PT Stop Time 1425    PT Time Calculation (min) 38 min    Equipment Utilized During Treatment Gait belt    Activity Tolerance Patient tolerated treatment well    Behavior During Therapy WFL for tasks assessed/performed                  Past Medical History:  Diagnosis Date   Alcohol abuse    Anxiety    Anxiety    Anxiety and depression    Asthma    Bipolar disorder ([place])    Borderline personality disorder ([place])    Depression    Diabetes ([place])    Type 2    Dyslipidemia    Esophageal varices ([name])    Foot drop    right   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[place])    GERD (gastroesophageal reflux disease)    Gout    History of drug abuse ([place])    Insomnia    Muscular dystrophies ([place])    Neuropathy    Pancreatitis    Recovering alcoholic ([place])    Past Surgical History:  Procedure Laterality Date   HERNIA REPAIR     IR FLUORO GUIDE CV LINE RIGHT  12/30/2020   IR REMOVAL TUN CV CATH W/O FL  01/19/2021   IR US GUIDE VASC ACCESS RIGHT  12/30/2020   Patient Active Problem List   Diagnosis Date Noted   Frequent falls 02/12/2021   Leukocytosis    Aspiration pneumonia of both lower lobes due to gastric secretions ([place]) 01/04/2021   Pressure injury of skin 12/24/2020   Acute hepatic encephalopathy ([place]) 12/20/2020   Sepsis ([place]) 12/19/2020   Acute hyponatremia 12/18/2020   Alcohol use disorder, moderate, dependence ([place]) 12/18/2020   Hepatic steatosis 12/18/2020    Polysubstance abuse ([place]) 12/18/2020   Hyperglycemia due to type 2 diabetes mellitus ([place]) 12/18/2020   Hyperbilirubinemia 12/18/2020   Lactic acidosis [id number]   Alcoholic liver disease ([place]) 12/18/2020   Bipolar 1 disorder, manic, moderate ([place]) 09/10/2018   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) 11/14/2011   Alcohol withdrawal ([place]) 11/13/2011   Bipolar 2 disorder ([place]) 11/13/2011   Tobacco abuse 11/13/2011   Cocaine abuse ([place]) 11/13/2011   Gout 11/13/2011     REFERRING DIAG: bilateral leg weakness  THERAPY DIAG:  Other abnormalities of gait and mobility  Muscle weakness (generalized)  History of falling  Rationale for Evaluation and Treatment Rehabilitation  PERTINENT HISTORY: Patient is a 46 y.o. female who presents to outpatient physical therapy with a referral for medical  diagnosis bilateral leg weakness. This patient's chief complaints consist of difficulty with weakness and balance with history of frequent falling leading to the following functional deficits: difficulty with basic mobility including ambulation, stairs, transfers; difficulty with ADLs, IADLs. Currently requires assistance to navigate stairs, assistance with ADLs and  IADLs, and ambulates with RW when she previously did not need an assistive device. Relevant past medical history and comorbidities include muscular dystrophy (FSHD - facioscapulohumeral muscular dystrophy), anxiety and depression, bipolar 1 disorder, borderline personality disorder, diabetes,  GERD, gout, pancreatitis, alcoholic liver disease, tobacco abuse,  foot drop, neuropathy, recovering alcoholic, history of drug abuse, history of hernia repair.  Patient denies hx of cancer, stroke, seizures, lung problems, heart problems, unexplained weight loss, unexplained changes in bowel or bladder problems, unexplained stumbling or dropping things, osteoporosis, and spinal surgery  PRECAUTIONS: Fall, not drink alcohol.  SUBJECTIVE: Patient reports she is feeling okay since she has had some time to rest. Her right shoulder is giving her a bit of a hard time to sleep. Her neuropathy is bothering her a lot lately. She was able to get furniture including beds for herself and her cousin through the furniture ministry. Her body is not as sore because she has been able to sleep in a bed. She arrives on a rollator. She states her neuropathy feels worse when her shoes on, especially when it is hot. It has been easier to do her HEP with the new furniture at her home.   PAIN:  Are you having pain? Yes. NPRS: 5/10 neuropathy in hands ankles and feet.  OBJECTIVE  TODAY'S TREATMENT: Therapeutic exercise: to centralize symptoms and improve ROM, strength, muscular endurance, and activity tolerance required for successful completion of functional activities.  - NuStep level 1-2 using lower extremities. Seat/handle setting 9. For improved extremity mobility, muscular endurance, and activity tolerance; and to induce the analgesic effect of aerobic exercise, stimulate improved joint nutrition, and prepare body structures and systems for following interventions. x 6 minutes. Average SPM = 89. - sit <> stand from 17 inch chair with B UE support on knees, 4x5 (moderate difficulty). - seated hamstring curl with heel on basketball, 1x20 each side - seated marching while sitting on large dynadisc on chair, 3x10 each side. (2# AW on each foot for last 2 sets).  - standing lateral double step each direction with R/GTB  around knees, 2x10 each way.   Neuromuscular Re-education: to improve, balance, postural strength, muscle activation patterns, and stabilization strength required for functional activities: - 365 degree turning alternating sides, 1x5 each way. SBA - narrow stance horizontal head rotation, 1x10 on each side on firm surface, 1x10 each side on compliant surface. SBA and touchdown UE support as needed on TM bar.   Pt required multimodal cuing for proper technique and to facilitate improved neuromuscular control, strength, range of motion, and functional ability resulting in improved performance and form. Requires CGA - SBA for balance exercises.   PATIENT EDUCATION:  Education details: Exercise purpose/form. Self management techniques. Person educated: Patient Education method: Explanation, demonstration, verbal cuing, tactile cuing. Education comprehension: verbalized understanding, demonstrated understanding, and needs further education     HOME EXERCISE PROGRAM: Access Code: [email or web address] URL: [email or web address] Date: 07/20/2021 Prepared by: Rosita Kea  Exercises - Sit to Stand  - 3-4 x weekly - 4 sets - 5 reps   ASSESSMENT:   CLINICAL IMPRESSION: Patient tolerated treatment well overall and was less limited by fatigue and pain today. Continued working on functional balance and strength  to improve functional mobility and independence. Provided rests breaks as needed. Plan to continue with similar interventions progressed as tolerated next session. Patient would benefit from continued management of limiting condition by skilled physical therapist to address remaining impairments and functional limitations to work towards stated goals and return to PLOF or maximal functional independence.   Patient is a 46 y.o. female referred to outpatient physical therapy with a medical diagnosis of bilateral leg weakness who presents with signs and symptoms consistent with generalized  weakness, balance deficits, and motor control problems likely related to deconditioning and FSHD muscular dystrophy. Patient presents with significant balance, motor control, posture, postural, gait, pain, muscle performance (strength/power/endurance) and activity tolerance impairments that are limiting ability to complete basic mobility and self care including ambulation, stairs, transfers, ADLs, and IADLs without difficulty. At eval requires assistance to navigate stairs, assistance with ADLs and  IADLs, and ambulates with RW when she previously did not need an assistive device. Patient would likely benefit from bilateral AFOs to address foot drop found at both ankles and improve her steppage gait. Patient will benefit from skilled physical therapy intervention to address current body structure impairments and activity limitations to improve function and work towards goals set in current POC in order to return to prior level of function or maximal functional improvement.      OBJECTIVE IMPAIRMENTS Abnormal gait, decreased activity tolerance, decreased balance, decreased coordination, decreased endurance, decreased mobility, difficulty walking, decreased ROM, decreased strength, impaired perceived functional ability, increased muscle spasms, impaired tone, impaired UE functional use, improper body mechanics, postural dysfunction, and pain.    ACTIVITY LIMITATIONS cleaning, community activity, driving, meal prep, laundry, and shopping.    PERSONAL FACTORS Behavior pattern, Fitness, Past/current experiences, Time since onset of injury/illness/exacerbation, Transportation, and 3+ comorbidities:    muscular dystrophy (FSHD - facioscapulohumeral muscular dystrophy), anxiety and depression, bipolar 1 disorder, borderline personality disorder, diabetes, GERD, gout, pancreatitis, alcoholic liver disease, tobacco abuse,  foot drop, neuropathy, recovering alcoholic, history of drug abuse, history of hernia repair are  also affecting patient's functional outcome.      REHAB POTENTIAL: Good   CLINICAL DECISION MAKING: Evolving/moderate complexity   EVALUATION COMPLEXITY: Moderate     GOALS: Goals reviewed with patient? No   SHORT TERM GOALS: Target date: 07/24/2021   Patient will be independent with initial home exercise program for self-management of symptoms. Baseline: Initial HEP to be provided at visit 2 as appropriate (07/10/21); Goal status: In Progress     LONG TERM GOALS: Target date: 10/02/2021   Patient will be independent with a long-term home exercise program for self-management of symptoms.  Baseline: Initial HEP to be provided at visit 2 as appropriate (07/10/21); Goal status: In-Progress   2.  Patient will demonstrate improved FOTO score by 10 points from baseline to demonstrate improvement in overall condition and self-reported functional ability.  Baseline: to be measured visit 2 as appropriate (07/10/21); Goal status: In-Progress   3.  Patient will ambulate equal or greater than 1000 feet during 6 Min Walk Test using [facility] or less restrictive assistive device to improve community ambulation.  Baseline: To be tested visit 2 as appropriate (07/10/21); 310 feet with RW (07/17/2021); Goal status: In-Progress   4.  Patient will complete 5 Time Sit To Stand test in equal or less than 20 seconds with no UE support from 18.5 inch plinth to demonstrate improved LE functional strength and power for basic mobility and improved fall risk.  Baseline: 24 seconds with B UE  on 18.5 inch plinth.  (07/10/21); Goal status: In-Progress   5.  Patient be able to ascend and descend stairs at her home mod I with use of B handrails.  Baseline: able to ascend/descend 4 steps in the clinic with B UE support on handrails and supervision - reports currently needs assistance to descend steps at home. (07/10/21); Goal status: In-Progress   6.  Patient will demonstrate ability to perform static balance in  tandem stance for equal or greater than 30 seconds on each side, to demonstrate improved balance and decreased fall risk.  Baseline: right foot front 10 seconds, left foot front 7 seconds (07/10/2021);  Goal status: In-Progress     PLAN: PT FREQUENCY: 2x/week   PT DURATION: 12 weeks   PLANNED INTERVENTIONS: Therapeutic exercises, Therapeutic activity, Neuromuscular re-education, Balance training, Gait training, Patient/Family education, Joint mobilization, Orthotic/Fit training, DME instructions, Dry Needling, Electrical stimulation, Cryotherapy, Moist heat, Splintting, Taping, and Manual therapy   PLAN FOR NEXT SESSION: continue with moderate intensity functional strengthening and balance exercises.     Everlean Alstrom. Graylon Good, PT, DPT 08/15/21, 2:27 PM  [facility] [address] [place], [place] [address] P: [phone] I F: [phone]

## 2021-08-15 ENCOUNTER — Encounter: Payer: Self-pay | Admitting: Physical Therapy

## 2021-08-15 ENCOUNTER — Ambulatory Visit: Payer: Medicare Other | Admitting: Physical Therapy

## 2021-08-15 DIAGNOSIS — Z9181 History of falling: Secondary | ICD-10-CM

## 2021-08-15 DIAGNOSIS — R2689 Other abnormalities of gait and mobility: Secondary | ICD-10-CM

## 2021-08-15 DIAGNOSIS — M6281 Muscle weakness (generalized): Secondary | ICD-10-CM | POA: Diagnosis not present

## 2021-08-15 DIAGNOSIS — R531 Weakness: Secondary | ICD-10-CM | POA: Diagnosis not present

## 2021-08-17 ENCOUNTER — Ambulatory Visit: Payer: Medicare Other | Admitting: Physical Therapy

## 2021-08-22 NOTE — Therapy (Signed)
OUTPATIENT PHYSICAL THERAPY TREATMENT NOTE   Patient Name: [name] MRN: [id number] DOB:[birth year], 46 y.o., female Today's Date: 08/23/2021  PCP: Eunice Blase, PA-C REFERRING PROVIDER: Eunice Blase, PA-C  END OF SESSION:   PT End of Session - 08/23/21 1638     Visit Number 8    Number of Visits 24    Date for PT Re-Evaluation 10/02/21    Authorization Type [employer] MEDICARE/Medicaid reporting period from 07/10/2021    Progress Note Due on Visit 10    PT Start Time 1520    PT Stop Time 1600    PT Time Calculation (min) 40 min    Equipment Utilized During Treatment Gait belt    Activity Tolerance Patient tolerated treatment well    Behavior During Therapy WFL for tasks assessed/performed                   Past Medical History:  Diagnosis Date   Alcohol abuse    Anxiety    Anxiety    Anxiety and depression    Asthma    Bipolar disorder ([name])    Borderline personality disorder ([name])    Depression    Diabetes ([name])    Type 2    Dyslipidemia    Esophageal varices ([name])    Foot drop    right   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[name])    GERD (gastroesophageal reflux disease)    Gout    History of drug abuse ([name])    Insomnia    Muscular dystrophies ([name])    Neuropathy    Pancreatitis    Recovering alcoholic ([name])    Past Surgical History:  Procedure Laterality Date   HERNIA REPAIR     IR FLUORO GUIDE CV LINE RIGHT  12/30/2020   IR REMOVAL TUN CV CATH W/O FL  01/19/2021   IR US GUIDE VASC ACCESS RIGHT  12/30/2020   Patient Active Problem List   Diagnosis Date Noted   Frequent falls 02/12/2021   Leukocytosis    Aspiration pneumonia of both lower lobes due to gastric secretions ([name]) 01/04/2021   Pressure injury of skin 12/24/2020   Acute hepatic encephalopathy ([name]) 12/20/2020   Sepsis ([name]) 12/19/2020   Acute hyponatremia 12/18/2020   Alcohol use disorder, moderate, dependence ([name]) 12/18/2020   Hepatic steatosis 12/18/2020    Polysubstance abuse ([name]) 12/18/2020   Hyperglycemia due to type 2 diabetes mellitus ([name]) 12/18/2020   Hyperbilirubinemia 12/18/2020   Lactic acidosis 12/18/2020   Alcoholic liver disease ([name]) 12/18/2020   Bipolar 1 disorder, manic, moderate ([name]) 09/10/2018   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   Diabetes ([name]) 09/10/2018   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   Chronic gout of foot 09/29/2015   Muscular dystrophy ([name]) 09/29/2015   Type 2 diabetes mellitus without complication ([name]) 09/29/2015   Anxiety and depression 10/01/2012   Protein-calorie malnutrition, severe ([name]) 09/30/2012   Alcohol abuse 09/26/2012   Trichomonas infection 09/26/2012   Dehydration 09/26/2012   Hypokalemia 11/14/2011   Thrombocytopenia ([name]) 11/14/2011   Alcohol withdrawal ([name]) 11/13/2011   Bipolar 2 disorder ([name]) 11/13/2011   Tobacco abuse 11/13/2011   Cocaine abuse ([name]) 11/13/2011   Gout 11/13/2011     REFERRING DIAG: bilateral leg weakness  THERAPY DIAG:  Other abnormalities of gait and mobility  Muscle weakness (generalized)  History of falling  Rationale for Evaluation and Treatment Rehabilitation  PERTINENT HISTORY: Patient is a 46 y.o. female who presents to outpatient physical therapy with a referral for  medical diagnosis bilateral leg weakness. This patient's chief complaints consist of difficulty with weakness and balance with history of frequent falling leading to the following functional deficits: difficulty with basic mobility including ambulation, stairs, transfers; difficulty with ADLs, IADLs. Currently requires assistance to navigate stairs, assistance with ADLs and  IADLs, and ambulates with RW when she previously did not need an assistive device. Relevant past medical history and comorbidities include muscular dystrophy (FSHD - facioscapulohumeral muscular dystrophy), anxiety and depression, bipolar 1 disorder, borderline personality disorder, diabetes,  GERD, gout, pancreatitis, alcoholic liver disease, tobacco abuse,  foot drop, neuropathy, recovering alcoholic, history of drug abuse, history of hernia repair.  Patient denies hx of cancer, stroke, seizures, lung problems, heart problems, unexplained weight loss, unexplained changes in bowel or bladder problems, unexplained stumbling or dropping things, osteoporosis, and spinal surgery  PRECAUTIONS: Fall, not drink alcohol.  SUBJECTIVE: Patient reports she has been having a lot of pain in her right hip. She has noticed it is hurting when she is sitting and when she lays on her right side. She also feels it when she is walking but does not do a lot of walking. It feels better after she has had some rest and sleep. It hasn't bothered her this much until the last week. The pain is located from the top of her right pelvis in the glutes to the back of her right thigh. She cannot tell if this feels different or the same than pain she has previously had. She has not done any sit to stands this week. She missed her last PT session because her cousin who brings her accidentally scheduled a conflicting appointment. Patient notes she has had more balance issues since she noticed the pain in the right hip.   PAIN:  Are you having pain? No  OBJECTIVE  TODAY'S TREATMENT: Therapeutic exercise: to centralize symptoms and improve ROM, strength, muscular endurance, and activity tolerance required for successful completion of functional activities.  - NuStep level 2 using lower extremities. Seat/handle setting 9. For improved extremity mobility, muscular endurance, and activity tolerance; and to induce the analgesic effect of aerobic exercise, stimulate improved joint nutrition, and prepare body structures and systems for following interventions. x 7:14 minutes. Average SPM = 88. - sit <> stand from 17 inch chair with B UE support on knees, 4x5 (moderate difficulty). - seated hamstring forward scoots on rolling stool,  2x10 feet - seated marching while sitting on large dynadisc on chair, 3x10 each side with 2# AW on each foot.   Neuromuscular Re-education: to improve, balance, postural strength, muscle activation patterns, and stabilization strength required for functional activities: - step standing pallof press with GTB with foot on 8.5 inch step, 1x10 each foot on each side, CGA. Seated rest in between.  - standing 365 turn, 2x5 each direction, with touch on wall and SBA for safety.  - standing cone tip and right, with U UE support at wall, 1x5 each side. CGA.   Pt required multimodal cuing for proper technique and to facilitate improved neuromuscular control, strength, range of motion, and functional ability resulting in improved performance and form. Requires CGA - SBA for balance exercises.   PATIENT EDUCATION:  Education details: Exercise purpose/form. Self management techniques. Person educated: Patient Education method: Explanation, demonstration, verbal cuing, tactile cuing. Education comprehension: verbalized understanding, demonstrated understanding, and needs further education     HOME EXERCISE PROGRAM: Access Code: [email or web address] URL: [email or web address] Date: 07/20/2021 Prepared by: Norton Blizzard  Exercises - Sit to Stand  -  3-4 x weekly - 4 sets - 5 reps   ASSESSMENT:   CLINICAL IMPRESSION: Patient tolerated treatment well overall with no increase in pain by end of session. Continued working on balance and functional strength. Patient found hamstring exercise and pallof press exercise very challenging. Plan to progress pallof exercise to more sets depending on tolerance assessed at next session. Patient would benefit from continued management of limiting condition by skilled physical therapist to address remaining impairments and functional limitations to work towards stated goals and return to PLOF or maximal functional independence.   Patient is a 46 y.o. female referred to  outpatient physical therapy with a medical diagnosis of bilateral leg weakness who presents with signs and symptoms consistent with generalized weakness, balance deficits, and motor control problems likely related to deconditioning and FSHD muscular dystrophy. Patient presents with significant balance, motor control, posture, postural, gait, pain, muscle performance (strength/power/endurance) and activity tolerance impairments that are limiting ability to complete basic mobility and self care including ambulation, stairs, transfers, ADLs, and IADLs without difficulty. At eval requires assistance to navigate stairs, assistance with ADLs and  IADLs, and ambulates with RW when she previously did not need an assistive device. Patient would likely benefit from bilateral AFOs to address foot drop found at both ankles and improve her steppage gait. Patient will benefit from skilled physical therapy intervention to address current body structure impairments and activity limitations to improve function and work towards goals set in current POC in order to return to prior level of function or maximal functional improvement.      OBJECTIVE IMPAIRMENTS Abnormal gait, decreased activity tolerance, decreased balance, decreased coordination, decreased endurance, decreased mobility, difficulty walking, decreased ROM, decreased strength, impaired perceived functional ability, increased muscle spasms, impaired tone, impaired UE functional use, improper body mechanics, postural dysfunction, and pain.    ACTIVITY LIMITATIONS cleaning, community activity, driving, meal prep, laundry, and shopping.    PERSONAL FACTORS Behavior pattern, Fitness, Past/current experiences, Time since onset of injury/illness/exacerbation, Transportation, and 3+ comorbidities:    muscular dystrophy (FSHD - facioscapulohumeral muscular dystrophy), anxiety and depression, bipolar 1 disorder, borderline personality disorder, diabetes, GERD, gout,  pancreatitis, alcoholic liver disease, tobacco abuse,  foot drop, neuropathy, recovering alcoholic, history of drug abuse, history of hernia repair are also affecting patient's functional outcome.      REHAB POTENTIAL: Good   CLINICAL DECISION MAKING: Evolving/moderate complexity   EVALUATION COMPLEXITY: Moderate     GOALS: Goals reviewed with patient? No   SHORT TERM GOALS: Target date: 07/24/2021   Patient will be independent with initial home exercise program for self-management of symptoms. Baseline: Initial HEP to be provided at visit 2 as appropriate (07/10/21); Goal status: In Progress     LONG TERM GOALS: Target date: 10/02/2021   Patient will be independent with a long-term home exercise program for self-management of symptoms.  Baseline: Initial HEP to be provided at visit 2 as appropriate (07/10/21); Goal status: In-Progress   2.  Patient will demonstrate improved FOTO score by 10 points from baseline to demonstrate improvement in overall condition and self-reported functional ability.  Baseline: to be measured visit 2 as appropriate (07/10/21); Goal status: In-Progress   3.  Patient will ambulate equal or greater than 1000 feet during 6 Min Walk Test using [facility] or less restrictive assistive device to improve community ambulation.  Baseline: To be tested visit 2 as appropriate (07/10/21); 310 feet with RW (07/17/2021); Goal status: In-Progress   4.  Patient will complete 5 Time Sit  To Stand test in equal or less than 20 seconds with no UE support from 18.5 inch plinth to demonstrate improved LE functional strength and power for basic mobility and improved fall risk.  Baseline: 24 seconds with B UE on 18.5 inch plinth.  (07/10/21); Goal status: In-Progress   5.  Patient be able to ascend and descend stairs at her home mod I with use of B handrails.  Baseline: able to ascend/descend 4 steps in the clinic with B UE support on handrails and supervision - reports currently  needs assistance to descend steps at home. (07/10/21); Goal status: In-Progress   6.  Patient will demonstrate ability to perform static balance in tandem stance for equal or greater than 30 seconds on each side, to demonstrate improved balance and decreased fall risk.  Baseline: right foot front 10 seconds, left foot front 7 seconds (07/10/2021);  Goal status: In-Progress     PLAN: PT FREQUENCY: 2x/week   PT DURATION: 12 weeks   PLANNED INTERVENTIONS: Therapeutic exercises, Therapeutic activity, Neuromuscular re-education, Balance training, Gait training, Patient/Family education, Joint mobilization, Orthotic/Fit training, DME instructions, Dry Needling, Electrical stimulation, Cryotherapy, Moist heat, Splintting, Taping, and Manual therapy   PLAN FOR NEXT SESSION: continue with moderate intensity functional strengthening and balance exercises.     Luretha MurphySara R. Ilsa IhaSnyder, PT, DPT 08/23/21, 4:58 PM  [facility] [address] [place], [place] [id number] P: [phone] I F: [phone]

## 2021-08-23 ENCOUNTER — Ambulatory Visit: Payer: Medicare Other | Attending: Internal Medicine | Admitting: Physical Therapy

## 2021-08-23 ENCOUNTER — Encounter: Payer: Self-pay | Admitting: Physical Therapy

## 2021-08-23 DIAGNOSIS — M6281 Muscle weakness (generalized): Secondary | ICD-10-CM | POA: Diagnosis not present

## 2021-08-23 DIAGNOSIS — R2689 Other abnormalities of gait and mobility: Secondary | ICD-10-CM | POA: Diagnosis not present

## 2021-08-23 DIAGNOSIS — Z9181 History of falling: Secondary | ICD-10-CM | POA: Insufficient documentation

## 2021-08-28 ENCOUNTER — Ambulatory Visit: Payer: Medicare Other | Admitting: Physical Therapy

## 2021-08-30 ENCOUNTER — Encounter: Payer: Self-pay | Admitting: Physical Therapy

## 2021-08-30 ENCOUNTER — Ambulatory Visit: Payer: Medicare Other | Attending: Internal Medicine | Admitting: Physical Therapy

## 2021-08-30 DIAGNOSIS — M6281 Muscle weakness (generalized): Secondary | ICD-10-CM | POA: Diagnosis not present

## 2021-08-30 DIAGNOSIS — Z9181 History of falling: Secondary | ICD-10-CM | POA: Insufficient documentation

## 2021-08-30 DIAGNOSIS — R2689 Other abnormalities of gait and mobility: Secondary | ICD-10-CM | POA: Insufficient documentation

## 2021-08-30 NOTE — Therapy (Signed)
OUTPATIENT PHYSICAL THERAPY TREATMENT NOTE   Patient Name: [name] MRN: [id number] DOB:[birth year], 46 y.o., female Today's Date: 08/30/2021  PCP: Eunice Blase, PA-C REFERRING PROVIDER: Eunice Blase, PA-C  END OF SESSION:   PT End of Session - 08/30/21 1309     Visit Number 9    Number of Visits 24    Date for PT Re-Evaluation 10/02/21    Authorization Type [employer] MEDICARE/Medicaid reporting period from 07/10/2021    Progress Note Due on Visit 10    PT Start Time 1304    PT Stop Time 1342    PT Time Calculation (min) 38 min    Equipment Utilized During Treatment Gait belt    Activity Tolerance Patient tolerated treatment well    Behavior During Therapy WFL for tasks assessed/performed                    Past Medical History:  Diagnosis Date   Alcohol abuse    Anxiety    Anxiety    Anxiety and depression    Asthma    Bipolar disorder ([name])    Borderline personality disorder ([name])    Depression    Diabetes ([name])    Type 2    Dyslipidemia    Esophageal varices ([name])    Foot drop    right   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[name])    GERD (gastroesophageal reflux disease)    Gout    History of drug abuse ([name])    Insomnia    Muscular dystrophies ([name])    Neuropathy    Pancreatitis    Recovering alcoholic ([name])    Past Surgical History:  Procedure Laterality Date   HERNIA REPAIR     IR FLUORO GUIDE CV LINE RIGHT  12/30/2020   IR REMOVAL TUN CV CATH W/O FL  01/19/2021   IR US GUIDE VASC ACCESS RIGHT  12/30/2020   Patient Active Problem List   Diagnosis Date Noted   Frequent falls 02/12/2021   Leukocytosis    Aspiration pneumonia of both lower lobes due to gastric secretions ([name]) 01/04/2021   Pressure injury of skin 12/24/2020   Acute hepatic encephalopathy ([name]) 12/20/2020   Sepsis ([name]) 12/19/2020   Acute hyponatremia 12/18/2020   Alcohol use disorder, moderate, dependence ([name]) 12/18/2020   Hepatic steatosis 12/18/2020    Polysubstance abuse ([name]) 12/18/2020   Hyperglycemia due to type 2 diabetes mellitus ([name]) 12/18/2020   Hyperbilirubinemia 12/18/2020   Lactic acidosis 12/18/2020   Alcoholic liver disease ([name]) 12/18/2020   Bipolar 1 disorder, manic, moderate ([name]) 09/10/2018   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   Diabetes ([name]) 09/10/2018   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   Chronic gout of foot 09/29/2015   Muscular dystrophy ([name]) 09/29/2015   Type 2 diabetes mellitus without complication ([name]) 09/29/2015   Anxiety and depression 10/01/2012   Protein-calorie malnutrition, severe ([name]) 09/30/2012   Alcohol abuse 09/26/2012   Trichomonas infection 09/26/2012   Dehydration 09/26/2012   Hypokalemia 11/14/2011   Thrombocytopenia ([name]) 11/14/2011   Alcohol withdrawal ([name]) 11/13/2011   Bipolar 2 disorder ([name]) 11/13/2011   Tobacco abuse 11/13/2011   Cocaine abuse ([name]) 11/13/2011   Gout 11/13/2011     REFERRING DIAG: bilateral leg weakness  THERAPY DIAG:  Other abnormalities of gait and mobility  Muscle weakness (generalized)  History of falling  Rationale for Evaluation and Treatment Rehabilitation  PERTINENT HISTORY: Patient is a 46 y.o. female who presents to outpatient physical therapy with a referral  for medical diagnosis bilateral leg weakness. This patient's chief complaints consist of difficulty with weakness and balance with history of frequent falling leading to the following functional deficits: difficulty with basic mobility including ambulation, stairs, transfers; difficulty with ADLs, IADLs. Currently requires assistance to navigate stairs, assistance with ADLs and  IADLs, and ambulates with RW when she previously did not need an assistive device. Relevant past medical history and comorbidities include muscular dystrophy (FSHD - facioscapulohumeral muscular dystrophy), anxiety and depression, bipolar 1 disorder, borderline personality disorder, diabetes,  GERD, gout, pancreatitis, alcoholic liver disease, tobacco abuse,  foot drop, neuropathy, recovering alcoholic, history of drug abuse, history of hernia repair.  Patient denies hx of cancer, stroke, seizures, lung problems, heart problems, unexplained weight loss, unexplained changes in bowel or bladder problems, unexplained stumbling or dropping things, osteoporosis, and spinal surgery  PRECAUTIONS: Fall, not drink alcohol.  SUBJECTIVE: Patient arrives on her rollator walker today. She came to the clinic on Monday this week but decided to go home because she felt she was having a flair of her dystrophy symptoms with pain all over her body. She spent the rest of Monday and most of yesterday resting and feels better now. She is continuing to have trouble in her right glute region and has general soreness of 2/10. She states the pain near the hip is worse as the day goes on. The increased pain started towards the end of Sunday. She felt okay after her last PT session.   PAIN:  Are you having pain? 2/10 generalized soreness.   OBJECTIVE  TODAY'S TREATMENT: Therapeutic exercise: to centralize symptoms and improve ROM, strength, muscular endurance, and activity tolerance required for successful completion of functional activities.  - NuStep level 3 using lower extremities. Seat/handle setting 9. For improved extremity mobility, muscular endurance, and activity tolerance; and to induce the analgesic effect of aerobic exercise, stimulate improved joint nutrition, and prepare body structures and systems for following interventions. x 6 minutes. Average SPM = 88. - sit <> stand from 17 inch chair with B UE support on knees, 4x6 (moderate difficulty). - seated hamstring forward scoots on rolling stool, 2x10 feet - seated marching while sitting on large dynadisc on chair, 3x10 each side with 2# AW on each foot. With pallof press hold with GTB one on each side last 2 sets.   Neuromuscular Re-education: to  improve, balance, postural strength, muscle activation patterns, and stabilization strength required for functional activities: - standing cone tip and right, with U UE support at [facility] (holding TM bar too easy), 1x10 each side. CGA. Seated rest after each set. Fatigues in arms and legs.  - step standing pallof press with GTB with foot on 8.5 inch step, 1x10 each foot on each side, CGA. Seated rest in between directions.  - standing 365 turn, 2x5 each direction, with touch on wall and SBA for safety.   Pt required multimodal cuing for proper technique and to facilitate improved neuromuscular control, strength, range of motion, and functional ability resulting in improved performance and form. Requires CGA - SBA for balance exercises.   PATIENT EDUCATION:  Education details: Exercise purpose/form. Self management techniques. Person educated: Patient Education method: Explanation, demonstration, verbal cuing, tactile cuing. Education comprehension: verbalized understanding, demonstrated understanding, and needs further education     HOME EXERCISE PROGRAM: Access Code: [email or web address] URL: [email or web address] Date: 07/20/2021 Prepared by: Norton Blizzard  Exercises - Sit to Stand  - 3-4 x weekly - 4 sets - 5 reps   ASSESSMENT:  CLINICAL IMPRESSION: Patient tolerated treatment well overall but stated she "could feel it" by end of session and expected to be sore 2 hours later. She continues to need frequent rest breaks. She was able to increased sit <> stand reps and progress some of her exercises slightly. Plan to formally assess progress next session.Patient would benefit from continued management of limiting condition by skilled physical therapist to address remaining impairments and functional limitations to work towards stated goals and return to PLOF or maximal functional independence.    Patient is a 46 y.o. female referred to outpatient physical therapy with a medical diagnosis of  bilateral leg weakness who presents with signs and symptoms consistent with generalized weakness, balance deficits, and motor control problems likely related to deconditioning and FSHD muscular dystrophy. Patient presents with significant balance, motor control, posture, postural, gait, pain, muscle performance (strength/power/endurance) and activity tolerance impairments that are limiting ability to complete basic mobility and self care including ambulation, stairs, transfers, ADLs, and IADLs without difficulty. At eval requires assistance to navigate stairs, assistance with ADLs and  IADLs, and ambulates with RW when she previously did not need an assistive device. Patient would likely benefit from bilateral AFOs to address foot drop found at both ankles and improve her steppage gait. Patient will benefit from skilled physical therapy intervention to address current body structure impairments and activity limitations to improve function and work towards goals set in current POC in order to return to prior level of function or maximal functional improvement.      OBJECTIVE IMPAIRMENTS Abnormal gait, decreased activity tolerance, decreased balance, decreased coordination, decreased endurance, decreased mobility, difficulty walking, decreased ROM, decreased strength, impaired perceived functional ability, increased muscle spasms, impaired tone, impaired UE functional use, improper body mechanics, postural dysfunction, and pain.    ACTIVITY LIMITATIONS cleaning, community activity, driving, meal prep, laundry, and shopping.    PERSONAL FACTORS Behavior pattern, Fitness, Past/current experiences, Time since onset of injury/illness/exacerbation, Transportation, and 3+ comorbidities:    muscular dystrophy (FSHD - facioscapulohumeral muscular dystrophy), anxiety and depression, bipolar 1 disorder, borderline personality disorder, diabetes, GERD, gout, pancreatitis, alcoholic liver disease, tobacco abuse,  foot  drop, neuropathy, recovering alcoholic, history of drug abuse, history of hernia repair are also affecting patient's functional outcome.      REHAB POTENTIAL: Good   CLINICAL DECISION MAKING: Evolving/moderate complexity   EVALUATION COMPLEXITY: Moderate     GOALS: Goals reviewed with patient? No   SHORT TERM GOALS: Target date: 07/24/2021   Patient will be independent with initial home exercise program for self-management of symptoms. Baseline: Initial HEP to be provided at visit 2 as appropriate (07/10/21); Goal status: In Progress     LONG TERM GOALS: Target date: 10/02/2021   Patient will be independent with a long-term home exercise program for self-management of symptoms.  Baseline: Initial HEP to be provided at visit 2 as appropriate (07/10/21); Goal status: In-Progress   2.  Patient will demonstrate improved FOTO score by 10 points from baseline to demonstrate improvement in overall condition and self-reported functional ability.  Baseline: to be measured visit 2 as appropriate (07/10/21); Goal status: In-Progress   3.  Patient will ambulate equal or greater than 1000 feet during 6 Min Walk Test using [facility] or less restrictive assistive device to improve community ambulation.  Baseline: To be tested visit 2 as appropriate (07/10/21); 310 feet with RW (07/17/2021); Goal status: In-Progress   4.  Patient will complete 5 Time Sit To Stand test in equal or less  than 20 seconds with no UE support from 18.5 inch plinth to demonstrate improved LE functional strength and power for basic mobility and improved fall risk.  Baseline: 24 seconds with B UE on 18.5 inch plinth.  (07/10/21); Goal status: In-Progress   5.  Patient be able to ascend and descend stairs at her home mod I with use of B handrails.  Baseline: able to ascend/descend 4 steps in the clinic with B UE support on handrails and supervision - reports currently needs assistance to descend steps at home. (07/10/21); Goal  status: In-Progress   6.  Patient will demonstrate ability to perform static balance in tandem stance for equal or greater than 30 seconds on each side, to demonstrate improved balance and decreased fall risk.  Baseline: right foot front 10 seconds, left foot front 7 seconds (07/10/2021);  Goal status: In-Progress     PLAN: PT FREQUENCY: 2x/week   PT DURATION: 12 weeks   PLANNED INTERVENTIONS: Therapeutic exercises, Therapeutic activity, Neuromuscular re-education, Balance training, Gait training, Patient/Family education, Joint mobilization, Orthotic/Fit training, DME instructions, Dry Needling, Electrical stimulation, Cryotherapy, Moist heat, Splintting, Taping, and Manual therapy   PLAN FOR NEXT SESSION: continue with moderate intensity functional strengthening and balance exercises.     Luretha MurphySara R. Ilsa IhaSnyder, PT, DPT 08/30/21, 2:03 PM  [facility] [address] [place] [address] P: [phone] I F: [id number]

## 2021-09-04 ENCOUNTER — Ambulatory Visit: Payer: Medicare Other | Admitting: Physical Therapy

## 2021-09-04 ENCOUNTER — Encounter: Payer: Self-pay | Admitting: Physical Therapy

## 2021-09-04 DIAGNOSIS — R2689 Other abnormalities of gait and mobility: Secondary | ICD-10-CM | POA: Diagnosis not present

## 2021-09-04 DIAGNOSIS — Z9181 History of falling: Secondary | ICD-10-CM | POA: Diagnosis not present

## 2021-09-04 DIAGNOSIS — M6281 Muscle weakness (generalized): Secondary | ICD-10-CM | POA: Diagnosis not present

## 2021-09-04 NOTE — Therapy (Signed)
OUTPATIENT PHYSICAL THERAPY TREATMENT / PROGRESS NOTE Dates of reporting from 07/10/2021 to 09/04/2021   Patient Name: [name] MRN: [id number] DOB:12-27-1975, 46 y.o., female Today's Date: 09/04/2021  PCP: Janine Limbo, PA-C REFERRING PROVIDER: Janine Limbo, PA-C  END OF SESSION:   PT End of Session - 09/04/21 1302     Visit Number 10    Number of Visits 24    Date for PT Re-Evaluation 10/02/21    Authorization Type [employer] MEDICARE/Medicaid reporting period from 07/10/2021    Progress Note Due on Visit 10    PT Start Time 1301    PT Stop Time 1340    PT Time Calculation (min) 39 min    Equipment Utilized During Treatment Gait belt    Activity Tolerance Patient tolerated treatment well    Behavior During Therapy WFL for tasks assessed/performed               Past Medical History:  Diagnosis Date   Alcohol abuse    Anxiety    Anxiety    Anxiety and depression    Asthma    Bipolar disorder ([place])    Borderline personality disorder ([place])    Depression    Diabetes ([place])    Type 2    Dyslipidemia    Esophageal varices ([name])    Foot drop    right   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[place])    GERD (gastroesophageal reflux disease)    Gout    History of drug abuse ([place])    Insomnia    Muscular dystrophies ([place])    Neuropathy    Pancreatitis    Recovering alcoholic ([place])    Past Surgical History:  Procedure Laterality Date   HERNIA REPAIR     IR FLUORO GUIDE CV LINE RIGHT  12/30/2020   IR REMOVAL TUN CV CATH W/O FL  01/19/2021   IR US GUIDE VASC ACCESS RIGHT  12/30/2020   Patient Active Problem List   Diagnosis Date Noted   Frequent falls 02/12/2021   Leukocytosis    Aspiration pneumonia of both lower lobes due to gastric secretions ([place]) 01/04/2021   Pressure injury of skin 12/24/2020   Acute hepatic encephalopathy ([place]) 12/20/2020   Sepsis ([place]) 12/19/2020   Acute hyponatremia 12/18/2020   Alcohol use disorder, moderate, dependence ([place])  12/18/2020   Hepatic steatosis 12/18/2020   Polysubstance abuse ([place]) 12/18/2020   Hyperglycemia due to type 2 diabetes mellitus ([place]) 12/18/2020   Hyperbilirubinemia 12/18/2020   Lactic acidosis 88/82/8003   Alcoholic liver disease ([place]) 12/18/2020   Bipolar 1 disorder, manic, moderate ([place]) 09/10/2018   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   Diabetes ([place]) 09/10/2018   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[place]) 03/28/2018   Chronic gout of foot 09/29/2015   Muscular dystrophy ([place]) 09/29/2015   Type 2 diabetes mellitus without complication ([place]) 49/17/9150   Anxiety and depression 10/01/2012   Protein-calorie malnutrition, severe ([place]) 09/30/2012   Alcohol abuse 09/26/2012   Trichomonas infection 09/26/2012   Dehydration 09/26/2012   Hypokalemia 11/14/2011   Thrombocytopenia ([place]) 11/14/2011   Alcohol withdrawal ([place]) 11/13/2011   Bipolar 2 disorder ([place]) 11/13/2011   Tobacco abuse 11/13/2011   Cocaine abuse ([place]) 11/13/2011   Gout 11/13/2011     REFERRING DIAG: bilateral leg weakness  THERAPY DIAG:  Other abnormalities of gait and mobility  Muscle weakness (generalized)  History of falling  Rationale for Evaluation and Treatment Rehabilitation  PERTINENT HISTORY: Patient is a 46 y.o. female who presents to outpatient physical  therapy with a referral for medical diagnosis bilateral leg weakness. This patient's chief complaints consist of difficulty with weakness and balance with history of frequent falling leading to the following functional deficits: difficulty with basic mobility including ambulation, stairs, transfers; difficulty with ADLs, IADLs. Currently requires assistance to navigate stairs, assistance with ADLs and  IADLs, and ambulates with RW when she previously did not need an assistive device. Relevant past medical history and comorbidities include muscular dystrophy (FSHD - facioscapulohumeral muscular dystrophy), anxiety and depression, bipolar 1  disorder, borderline personality disorder, diabetes, GERD, gout, pancreatitis, alcoholic liver disease, tobacco abuse,  foot drop, neuropathy, recovering alcoholic, history of drug abuse, history of hernia repair.  Patient denies hx of cancer, stroke, seizures, lung problems, heart problems, unexplained weight loss, unexplained changes in bowel or bladder problems, unexplained stumbling or dropping things, osteoporosis, and spinal surgery  PRECAUTIONS: Fall, not drink alcohol.  SUBJECTIVE: Patient arrives with rollator. She states she fell earlier today. She said her pride is hurt more than anything and her cousin helped her get up. She was turning too quickly while holding things in her arms, and the chair was not in the right spot. She was also really sore after the new exercises last session. She thinks having pain over the weekend after having a flair in pain earlier last week and the new exercises last week may have contributed to the fall. She feels like PT is helping her. She states her balance still has a lot to be desired but she feels stronger. She has 5 steps into her home now and one step up in the home. She can get up and down her stairs now without assistance from another person. She feels like she has a bit more stamina. She is cooking again. Her right hip continues to bother her. She has an appointment with the chiropractor later this month.   PAIN:  Are you having pain? 4/10 lower back and right hip.   OBJECTIVE  SELF-REPORTED FUNCTION FOTO score: 50/100 (NOC-Neuromuscular disorder questionnaire)  FUNCTIONAL/BALANCE TESTS: 6 Minute Walk Test: 556 feet with rollator and steppage gait, R > L. Reports discomfort in B UE and low back.  Five Time Sit to Stand (5TSTS): 20 seconds with B UE on thighs from 18.5 inch plinth.   Static balance: tandem stance, eyes open: right foot front 4 seconds, left foot front 6 seconds.   Stairs: able to ascend/descend 4 steps in the clinic with B UE  support on handrails and supervision, leading up and down with left. able to perform with reversed LE leads.      TODAY'S TREATMENT: Therapeutic exercise: to centralize symptoms and improve ROM, strength, muscular endurance, and activity tolerance required for successful completion of functional activities.  - testing to assess progress (see above).  - stair practice: 3x up/down 4 steps (6 inch) practicing with different legs leading.   Neuromuscular Re-education: to improve, balance, postural strength, muscle activation patterns, and stabilization strength required for functional activities: - standing cone tip and right, with U UE support at [facility] (holding TM bar too easy), 1x10 each side. CGA. Seated rest after each set. Fatigues in arms and legs.   Pt required multimodal cuing for proper technique and to facilitate improved neuromuscular control, strength, range of motion, and functional ability resulting in improved performance and form. Requires CGA - SBA for balance exercises.   PATIENT EDUCATION:  Education details: Exercise purpose/form. Self management techniques. Person educated: Patient Education method: Explanation, demonstration, verbal cuing, tactile cuing.  Education comprehension: verbalized understanding, demonstrated understanding, and needs further education     HOME EXERCISE PROGRAM: Access Code: [email or web address] URL: [email or web address] Date: 07/20/2021 Prepared by: Rosita Kea  Exercises - Sit to Stand  - 3-4 x weekly - 4 sets - 5 reps   ASSESSMENT:   CLINICAL IMPRESSION: Patient has attended 10 physical therapy since starting this episode of care on 07/10/2021. She is making progress towards most goals including improving her 6MWT distance for 310 to 556 feet and 5TSTS from 24 seconds to 20 seconds with less UE support. Patient has been limited by quick fatigue and pain with heavier exertion that has limited her participation at times. She continues to have  high fall risk and profound mobility limitations. Patient would benefit from continued management of limiting condition by skilled physical therapist to address remaining impairments and functional limitations to work towards stated goals and return to PLOF or maximal functional independence.   Patient is a 46 y.o. female referred to outpatient physical therapy with a medical diagnosis of bilateral leg weakness who presents with signs and symptoms consistent with generalized weakness, balance deficits, and motor control problems likely related to deconditioning and FSHD muscular dystrophy. Patient presents with significant balance, motor control, posture, postural, gait, pain, muscle performance (strength/power/endurance) and activity tolerance impairments that are limiting ability to complete basic mobility and self care including ambulation, stairs, transfers, ADLs, and IADLs without difficulty. At eval requires assistance to navigate stairs, assistance with ADLs and  IADLs, and ambulates with RW when she previously did not need an assistive device. Patient would likely benefit from bilateral AFOs to address foot drop found at both ankles and improve her steppage gait. Patient will benefit from skilled physical therapy intervention to address current body structure impairments and activity limitations to improve function and work towards goals set in current POC in order to return to prior level of function or maximal functional improvement.      OBJECTIVE IMPAIRMENTS Abnormal gait, decreased activity tolerance, decreased balance, decreased coordination, decreased endurance, decreased mobility, difficulty walking, decreased ROM, decreased strength, impaired perceived functional ability, increased muscle spasms, impaired tone, impaired UE functional use, improper body mechanics, postural dysfunction, and pain.    ACTIVITY LIMITATIONS cleaning, community activity, driving, meal prep, laundry, and shopping.     PERSONAL FACTORS Behavior pattern, Fitness, Past/current experiences, Time since onset of injury/illness/exacerbation, Transportation, and 3+ comorbidities:    muscular dystrophy (FSHD - facioscapulohumeral muscular dystrophy), anxiety and depression, bipolar 1 disorder, borderline personality disorder, diabetes, GERD, gout, pancreatitis, alcoholic liver disease, tobacco abuse,  foot drop, neuropathy, recovering alcoholic, history of drug abuse, history of hernia repair are also affecting patient's functional outcome.      REHAB POTENTIAL: Good   CLINICAL DECISION MAKING: Evolving/moderate complexity   EVALUATION COMPLEXITY: Moderate     GOALS: Goals reviewed with patient? No   SHORT TERM GOALS: Target date: 07/24/2021   Patient will be independent with initial home exercise program for self-management of symptoms. Baseline: Initial HEP to be provided at visit 2 as appropriate (07/10/21); Goal status: Met     LONG TERM GOALS: Target date: 10/02/2021   Patient will be independent with a long-term home exercise program for self-management of symptoms.  Baseline: Initial HEP to be provided at visit 2 as appropriate (07/10/21); participating as tolerated, recently too much pain to participate (09/04/2021);  Goal status: In-Progress   2.  Patient will demonstrate improved FOTO score by 10 points from baseline to demonstrate improvement in overall condition and  self-reported functional ability.  Baseline: to be measured visit 2 as appropriate (07/10/21); 46 at visit # 2 (07/17/2021); 50 at visit # 10 (09/04/2021); Goal status: In-Progress   3.  Patient will ambulate equal or greater than 1000 feet during 6 Min Walk Test using [facility] or less restrictive assistive device to improve community ambulation.  Baseline: To be tested visit 2 as appropriate (07/10/21); 310 feet with RW (07/17/2021); 556 feet with rollator (09/04/2021);  Goal status: In-Progress   4.  Patient will complete 5 Time Sit To Stand  test in equal or less than 20 seconds with no UE support from 18.5 inch plinth to demonstrate improved LE functional strength and power for basic mobility and improved fall risk.  Baseline: 24 seconds with B UE on 18.5 inch plinth.  (07/10/21); 20 seconds with B UE on thighs from 18.5 inch plinth (09/04/2021);  Goal status: In-Progress   5.  Patient be able to ascend and descend stairs at her home mod I with use of B handrails.  Baseline: able to ascend/descend 4 steps in the clinic with B UE support on handrails and supervision - reports currently needs assistance to descend steps at home. (07/10/21); able to ascend/descend 4 steps in the clinic with B UE support on handrails and supervision, leading up and down with left. able to perform with reversed LE leads (09/04/2021); Goal status: In-Progress   6.  Patient will demonstrate ability to perform static balance in tandem stance for equal or greater than 30 seconds on each side, to demonstrate improved balance and decreased fall risk.  Baseline: right foot front 10 seconds, left foot front 7 seconds (07/10/2021); right foot front 4 seconds, left foot front 6 seconds (09/04/2021);  Goal status: In-Progress     PLAN: PT FREQUENCY: 2x/week   PT DURATION: 12 weeks   PLANNED INTERVENTIONS: Therapeutic exercises, Therapeutic activity, Neuromuscular re-education, Balance training, Gait training, Patient/Family education, Joint mobilization, Orthotic/Fit training, DME instructions, Dry Needling, Electrical stimulation, Cryotherapy, Moist heat, Splintting, Taping, and Manual therapy   PLAN FOR NEXT SESSION: continue with moderate intensity functional strengthening and balance exercises.     Everlean Alstrom. Graylon Good, PT, DPT 09/04/21, 1:43 PM  [facility] [address] [place], [place] [address] P: [phone] I F: [phone]

## 2021-09-06 ENCOUNTER — Ambulatory Visit: Payer: Medicare Other | Admitting: Physical Therapy

## 2021-09-06 ENCOUNTER — Encounter: Payer: Self-pay | Admitting: Physical Therapy

## 2021-09-06 DIAGNOSIS — M6281 Muscle weakness (generalized): Secondary | ICD-10-CM | POA: Diagnosis not present

## 2021-09-06 DIAGNOSIS — Z9181 History of falling: Secondary | ICD-10-CM

## 2021-09-06 DIAGNOSIS — R2689 Other abnormalities of gait and mobility: Secondary | ICD-10-CM

## 2021-09-06 NOTE — Therapy (Signed)
OUTPATIENT PHYSICAL THERAPY TREATMENT NOTE   Patient Name: [name] MRN: [id number] DOB:01/10/1976, 46 y.o., female Today's Date: 09/06/2021  PCP: Janine Limbo, PA-C REFERRING PROVIDER: Janine Limbo, PA-C  END OF SESSION:   PT End of Session - 09/06/21 1437     Visit Number 11    Number of Visits 24    Date for PT Re-Evaluation 10/02/21    Authorization Type [employer] MEDICARE/Medicaid reporting period from 09/04/2021    Progress Note Due on Visit 10    PT Start Time 1435    PT Stop Time 1515    PT Time Calculation (min) 40 min    Equipment Utilized During Treatment Gait belt    Activity Tolerance Patient tolerated treatment well    Behavior During Therapy WFL for tasks assessed/performed              Past Medical History:  Diagnosis Date   Alcohol abuse    Anxiety    Anxiety    Anxiety and depression    Asthma    Bipolar disorder ([place])    Borderline personality disorder ([place])    Depression    Diabetes ([place])    Type 2    Dyslipidemia    Esophageal varices ([name])    Foot drop    right   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[place])    GERD (gastroesophageal reflux disease)    Gout    History of drug abuse ([place])    Insomnia    Muscular dystrophies ([place])    Neuropathy    Pancreatitis    Recovering alcoholic ([place])    Past Surgical History:  Procedure Laterality Date   HERNIA REPAIR     IR FLUORO GUIDE CV LINE RIGHT  12/30/2020   IR REMOVAL TUN CV CATH W/O FL  01/19/2021   IR US GUIDE VASC ACCESS RIGHT  12/30/2020   Patient Active Problem List   Diagnosis Date Noted   Frequent falls 02/12/2021   Leukocytosis    Aspiration pneumonia of both lower lobes due to gastric secretions ([place]) 01/04/2021   Pressure injury of skin 12/24/2020   Acute hepatic encephalopathy ([place]) 12/20/2020   Sepsis ([place]) 12/19/2020   Acute hyponatremia 12/18/2020   Alcohol use disorder, moderate, dependence ([place]) 12/18/2020   Hepatic steatosis 12/18/2020   Polysubstance  abuse ([place]) 12/18/2020   Hyperglycemia due to type 2 diabetes mellitus ([place]) 12/18/2020   Hyperbilirubinemia 12/18/2020   Lactic acidosis 73/71/0626   Alcoholic liver disease ([place]) 12/18/2020   Bipolar 1 disorder, manic, moderate ([place]) 09/10/2018   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) 09/30/2012   Alcohol abuse 09/26/2012   Trichomonas infection 09/26/2012   Dehydration 09/26/2012   Hypokalemia 11/14/2011   Thrombocytopenia ([place]) 11/14/2011   Alcohol withdrawal ([place]) 11/13/2011   Bipolar 2 disorder ([place]) 11/13/2011   Tobacco abuse 11/13/2011   Cocaine abuse ([place]) 11/13/2011   Gout 11/13/2011     REFERRING DIAG: bilateral leg weakness  THERAPY DIAG:  Other abnormalities of gait and mobility  Muscle weakness (generalized)  History of falling  Rationale for Evaluation and Treatment Rehabilitation  PERTINENT HISTORY: Patient is a 46 y.o. female who presents to outpatient physical therapy with a referral for medical diagnosis bilateral leg weakness.  This patient's chief complaints consist of difficulty with weakness and balance with history of frequent falling leading to the following functional deficits: difficulty with basic mobility including ambulation, stairs, transfers; difficulty with ADLs, IADLs. Currently requires assistance to navigate stairs, assistance with ADLs and  IADLs, and ambulates with RW when she previously did not need an assistive device. Relevant past medical history and comorbidities include muscular dystrophy (FSHD - facioscapulohumeral muscular dystrophy), anxiety and depression, bipolar 1 disorder, borderline personality disorder, diabetes, GERD, gout,  pancreatitis, alcoholic liver disease, tobacco abuse,  foot drop, neuropathy, recovering alcoholic, history of drug abuse, history of hernia repair.  Patient denies hx of cancer, stroke, seizures, lung problems, heart problems, unexplained weight loss, unexplained changes in bowel or bladder problems, unexplained stumbling or dropping things, osteoporosis, and spinal surgery  PRECAUTIONS: Fall, not drink alcohol.  SUBJECTIVE: Patient arrives with rollator. She states she is tired today after not being able to sleep this morning, then made breakfast this morning. She has a little tightness on the right low back.   PAIN:  Are you having pain? 3/10 lower back and right hip.   OBJECTIVE  TODAY'S TREATMENT: Therapeutic exercise: to centralize symptoms and improve ROM, strength, muscular endurance, and activity tolerance required for successful completion of functional activities.  - NuStep level 4 using lower extremities. Seat/handle setting 9. For improved extremity mobility, muscular endurance, and activity tolerance; and to induce the analgesic effect of aerobic exercise, stimulate improved joint nutrition, and prepare body structures and systems for following interventions. x 6 minutes. Average SPM = 91. - sit <> stand from 17 inch chair with B UE support on knees, 4x6 (moderate difficulty). - sidelying hip abduction, 3x10 (much weaker on R than left).    Neuromuscular Re-education: to improve, balance, postural strength, muscle activation patterns, and stabilization strength required for functional activities: - standing cone tip and right, with U UE support at [facility] (holding TM bar too easy), 1x10 each side. CGA. Seated rest afterwards.  - standing static balance on airex, 1x99mn with wide stance, 2x1 min with narrow stance.    Pt required multimodal cuing for proper technique and to facilitate improved neuromuscular control, strength, range of motion, and functional ability resulting in improved  performance and form. Requires CGA - SBA for balance exercises.   PATIENT EDUCATION:  Education details: Exercise purpose/form. Self management techniques. Person educated: Patient Education method: Explanation, demonstration, verbal cuing, tactile cuing. Education comprehension: verbalized understanding, demonstrated understanding, and needs further education     HOME EXERCISE PROGRAM: Access Code: [email or web address]: [email or web address] Date: 07/20/2021 Prepared by: SRosita Kea Exercises - Sit to Stand  - 3-4 x weekly - 4 sets - 5 reps   ASSESSMENT:   CLINICAL IMPRESSION: Patient tolerated treatment with some increased fatigue by end of session. Patient continues to work towards improved functional strength and balance. Provided frequent rests and scaled exercise intensity to attempt to avoid unacceptable increase in pain/fatigue post session. Plan to continue working on functional strength and balance as tolerated next session. Patient would benefit from continued management of limiting condition by skilled physical therapist to address remaining impairments and functional limitations to work towards stated goals and return to PLOF or maximal functional independence.   Patient is a 46y.o. female referred to outpatient physical therapy with a medical diagnosis of bilateral leg weakness who presents with signs and symptoms consistent with generalized weakness, balance deficits, and motor control problems likely related to deconditioning and FSHD muscular dystrophy. Patient presents with  significant balance, motor control, posture, postural, gait, pain, muscle performance (strength/power/endurance) and activity tolerance impairments that are limiting ability to complete basic mobility and self care including ambulation, stairs, transfers, ADLs, and IADLs without difficulty. At eval requires assistance to navigate stairs, assistance with ADLs and  IADLs, and ambulates with RW when  she previously did not need an assistive device. Patient would likely benefit from bilateral AFOs to address foot drop found at both ankles and improve her steppage gait. Patient will benefit from skilled physical therapy intervention to address current body structure impairments and activity limitations to improve function and work towards goals set in current POC in order to return to prior level of function or maximal functional improvement.      OBJECTIVE IMPAIRMENTS Abnormal gait, decreased activity tolerance, decreased balance, decreased coordination, decreased endurance, decreased mobility, difficulty walking, decreased ROM, decreased strength, impaired perceived functional ability, increased muscle spasms, impaired tone, impaired UE functional use, improper body mechanics, postural dysfunction, and pain.    ACTIVITY LIMITATIONS cleaning, community activity, driving, meal prep, laundry, and shopping.    PERSONAL FACTORS Behavior pattern, Fitness, Past/current experiences, Time since onset of injury/illness/exacerbation, Transportation, and 3+ comorbidities:    muscular dystrophy (FSHD - facioscapulohumeral muscular dystrophy), anxiety and depression, bipolar 1 disorder, borderline personality disorder, diabetes, GERD, gout, pancreatitis, alcoholic liver disease, tobacco abuse,  foot drop, neuropathy, recovering alcoholic, history of drug abuse, history of hernia repair are also affecting patient's functional outcome.      REHAB POTENTIAL: Good   CLINICAL DECISION MAKING: Evolving/moderate complexity   EVALUATION COMPLEXITY: Moderate     GOALS: Goals reviewed with patient? No   SHORT TERM GOALS: Target date: 07/24/2021   Patient will be independent with initial home exercise program for self-management of symptoms. Baseline: Initial HEP to be provided at visit 2 as appropriate (07/10/21); Goal status: Met     LONG TERM GOALS: Target date: 10/02/2021   Patient will be independent with  a long-term home exercise program for self-management of symptoms.  Baseline: Initial HEP to be provided at visit 2 as appropriate (07/10/21); participating as tolerated, recently too much pain to participate (09/04/2021);  Goal status: In-Progress   2.  Patient will demonstrate improved FOTO score by 10 points from baseline to demonstrate improvement in overall condition and self-reported functional ability.  Baseline: to be measured visit 2 as appropriate (07/10/21); 46 at visit # 2 (07/17/2021); 50 at visit # 10 (09/04/2021); Goal status: In-Progress   3.  Patient will ambulate equal or greater than 1000 feet during 6 Min Walk Test using [facility] or less restrictive assistive device to improve community ambulation.  Baseline: To be tested visit 2 as appropriate (07/10/21); 310 feet with RW (07/17/2021); 556 feet with rollator (09/04/2021);  Goal status: In-Progress   4.  Patient will complete 5 Time Sit To Stand test in equal or less than 20 seconds with no UE support from 18.5 inch plinth to demonstrate improved LE functional strength and power for basic mobility and improved fall risk.  Baseline: 24 seconds with B UE on 18.5 inch plinth.  (07/10/21); 20 seconds with B UE on thighs from 18.5 inch plinth (09/04/2021);  Goal status: In-Progress   5.  Patient be able to ascend and descend stairs at her home mod I with use of B handrails.  Baseline: able to ascend/descend 4 steps in the clinic with B UE support on handrails and supervision - reports currently needs assistance to descend steps at home. (07/10/21); able to  ascend/descend 4 steps in the clinic with B UE support on handrails and supervision, leading up and down with left. able to perform with reversed LE leads (09/04/2021); Goal status: In-Progress   6.  Patient will demonstrate ability to perform static balance in tandem stance for equal or greater than 30 seconds on each side, to demonstrate improved balance and decreased fall risk.  Baseline:  right foot front 10 seconds, left foot front 7 seconds (07/10/2021); right foot front 4 seconds, left foot front 6 seconds (09/04/2021);  Goal status: In-Progress     PLAN: PT FREQUENCY: 2x/week   PT DURATION: 12 weeks   PLANNED INTERVENTIONS: Therapeutic exercises, Therapeutic activity, Neuromuscular re-education, Balance training, Gait training, Patient/Family education, Joint mobilization, Orthotic/Fit training, DME instructions, Dry Needling, Electrical stimulation, Cryotherapy, Moist heat, Splintting, Taping, and Manual therapy   PLAN FOR NEXT SESSION: continue with moderate intensity functional strengthening and balance exercises.     Everlean Alstrom. Graylon Good, PT, DPT 09/06/21, 3:33 PM  [facility] [address] [place], [place] [address] P: [phone] I F: [phone]

## 2021-09-11 ENCOUNTER — Encounter: Payer: Self-pay | Admitting: Physical Therapy

## 2021-09-11 ENCOUNTER — Ambulatory Visit: Payer: Medicare Other

## 2021-09-11 DIAGNOSIS — M6281 Muscle weakness (generalized): Secondary | ICD-10-CM

## 2021-09-11 DIAGNOSIS — Z9181 History of falling: Secondary | ICD-10-CM

## 2021-09-11 DIAGNOSIS — R2689 Other abnormalities of gait and mobility: Secondary | ICD-10-CM | POA: Diagnosis not present

## 2021-09-13 DIAGNOSIS — Z79899 Other long term (current) drug therapy: Secondary | ICD-10-CM | POA: Diagnosis not present

## 2021-09-13 DIAGNOSIS — E785 Hyperlipidemia, unspecified: Secondary | ICD-10-CM | POA: Diagnosis not present

## 2021-09-13 DIAGNOSIS — R1310 Dysphagia, unspecified: Secondary | ICD-10-CM | POA: Diagnosis not present

## 2021-09-13 DIAGNOSIS — K219 Gastro-esophageal reflux disease without esophagitis: Secondary | ICD-10-CM | POA: Diagnosis not present

## 2021-09-13 DIAGNOSIS — M109 Gout, unspecified: Secondary | ICD-10-CM | POA: Diagnosis not present

## 2021-09-13 DIAGNOSIS — G629 Polyneuropathy, unspecified: Secondary | ICD-10-CM | POA: Diagnosis not present

## 2021-09-13 DIAGNOSIS — J449 Chronic obstructive pulmonary disease, unspecified: Secondary | ICD-10-CM | POA: Diagnosis not present

## 2021-09-13 DIAGNOSIS — E1142 Type 2 diabetes mellitus with diabetic polyneuropathy: Secondary | ICD-10-CM | POA: Diagnosis not present

## 2021-09-14 DIAGNOSIS — Z79899 Other long term (current) drug therapy: Secondary | ICD-10-CM | POA: Diagnosis not present

## 2021-09-15 DIAGNOSIS — R531 Weakness: Secondary | ICD-10-CM | POA: Diagnosis not present

## 2021-09-18 ENCOUNTER — Ambulatory Visit: Payer: Medicare Other | Admitting: Physical Therapy

## 2021-09-20 ENCOUNTER — Ambulatory Visit: Payer: Medicare Other | Attending: Internal Medicine | Admitting: Physical Therapy

## 2021-09-20 ENCOUNTER — Encounter: Payer: Self-pay | Admitting: Physical Therapy

## 2021-09-20 DIAGNOSIS — R2689 Other abnormalities of gait and mobility: Secondary | ICD-10-CM | POA: Insufficient documentation

## 2021-09-20 DIAGNOSIS — Z9181 History of falling: Secondary | ICD-10-CM | POA: Diagnosis not present

## 2021-09-20 DIAGNOSIS — M6281 Muscle weakness (generalized): Secondary | ICD-10-CM | POA: Diagnosis not present

## 2021-09-20 NOTE — Therapy (Signed)
OUTPATIENT PHYSICAL THERAPY TREATMENT NOTE   Patient Name: [name] MRN: [id number] DOB:[birth year], 46 y.o., female Today's Date: 09/20/2021  PCP: Janine Limbo, PA-C REFERRING PROVIDER: Janine Limbo, PA-C  END OF SESSION:   PT End of Session - 09/20/21 1317     Visit Number 13    Number of Visits 24    Date for PT Re-Evaluation 10/02/21    Authorization Type [employer] MEDICARE/Medicaid reporting period from 09/04/2021    Progress Note Due on Visit 10    PT Start Time 1306    PT Stop Time 1344    PT Time Calculation (min) 38 min    Equipment Utilized During Treatment Gait belt    Activity Tolerance Patient tolerated treatment well    Behavior During Therapy WFL for tasks assessed/performed               Past Medical History:  Diagnosis Date   Alcohol abuse    Anxiety    Anxiety    Anxiety and depression    Asthma    Bipolar disorder ([place])    Borderline personality disorder ([place])    Depression    Diabetes ([place])    Type 2    Dyslipidemia    Esophageal varices ([name])    Foot drop    right   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[place])    GERD (gastroesophageal reflux disease)    Gout    History of drug abuse ([place])    Insomnia    Muscular dystrophies ([place])    Neuropathy    Pancreatitis    Recovering alcoholic ([place])    Past Surgical History:  Procedure Laterality Date   HERNIA REPAIR     IR FLUORO GUIDE CV LINE RIGHT  12/30/2020   IR REMOVAL TUN CV CATH W/O FL  01/19/2021   IR US GUIDE VASC ACCESS RIGHT  12/30/2020   Patient Active Problem List   Diagnosis Date Noted   Frequent falls 02/12/2021   Leukocytosis    Aspiration pneumonia of both lower lobes due to gastric secretions ([place]) 01/04/2021   Pressure injury of skin 12/24/2020   Acute hepatic encephalopathy ([place]) 12/20/2020   Sepsis ([place]) 12/19/2020   Acute hyponatremia 12/18/2020   Alcohol use disorder, moderate, dependence ([place]) 12/18/2020   Hepatic steatosis 12/18/2020   Polysubstance  abuse ([place]) 12/18/2020   Hyperglycemia due to type 2 diabetes mellitus ([place]) 12/18/2020   Hyperbilirubinemia 12/18/2020   Lactic acidosis 25/00/3704   Alcoholic liver disease ([place]) 12/18/2020   Bipolar 1 disorder, manic, moderate ([place]) 09/10/2018   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   Diabetes ([place]) 09/10/2018   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[place]) 03/28/2018   Chronic gout of foot 09/29/2015   Muscular dystrophy ([place]) 09/29/2015   Type 2 diabetes mellitus without complication ([place]) 88/89/1694   Anxiety and depression 10/01/2012   Protein-calorie malnutrition, severe ([place]) 09/30/2012   Alcohol abuse 09/26/2012   Trichomonas infection 09/26/2012   Dehydration 09/26/2012   Hypokalemia 11/14/2011   Thrombocytopenia ([place]) 11/14/2011   Alcohol withdrawal ([place]) 11/13/2011   Bipolar 2 disorder ([place]) 11/13/2011   Tobacco abuse 11/13/2011   Cocaine abuse ([place]) 11/13/2011   Gout 11/13/2011     REFERRING DIAG: bilateral leg weakness  THERAPY DIAG:  Other abnormalities of gait and mobility  Muscle weakness (generalized)  History of falling  Rationale for Evaluation and Treatment Rehabilitation  PERTINENT HISTORY: Patient is a 46 y.o. female who presents to outpatient physical therapy with a referral for medical diagnosis bilateral leg  weakness. This patient's chief complaints consist of difficulty with weakness and balance with history of frequent falling leading to the following functional deficits: difficulty with basic mobility including ambulation, stairs, transfers; difficulty with ADLs, IADLs. Currently requires assistance to navigate stairs, assistance with ADLs and  IADLs, and ambulates with RW when she previously did not need an assistive device. Relevant past medical history and comorbidities include muscular dystrophy (FSHD - facioscapulohumeral muscular dystrophy), anxiety and depression, bipolar 1 disorder, borderline personality disorder, diabetes, GERD, gout,  pancreatitis, alcoholic liver disease, tobacco abuse,  foot drop, neuropathy, recovering alcoholic, history of drug abuse, history of hernia repair.  Patient denies hx of cancer, stroke, seizures, lung problems, heart problems, unexplained weight loss, unexplained changes in bowel or bladder problems, unexplained stumbling or dropping things, osteoporosis, and spinal surgery  PRECAUTIONS: Fall, not drink alcohol.  SUBJECTIVE: Patient arrives with rollator. Pt states a lot of fatigue over the day after last PT session but it "did not dominate her week." She reports pain in the right SIJ region. She thinks she overdid it a bit yesterday and had a dizzy spell after doing a lot of cooking. Her blood glucose was within usual range for her. She went and laid by the [facility] vent and felt better. Her cousin has been feeling light headed a lot lately but has also been going through health issues of her own. She just realized she forgot her neuropathy medication today. She expects to have a lot of pain by end of session due to forgetting her medication. She states the heat is really bothering her.   PAIN:   4/10 NRPS at right low back/glute region.  OBJECTIVE  TODAY'S TREATMENT: Therapeutic exercise: to centralize symptoms and improve ROM, strength, muscular endurance, and activity tolerance required for successful completion of functional activities.  NuStep level 4 using lower extremities. Seat/handle setting 9. For improved extremity mobility, muscular endurance, and activity tolerance; and to induce the analgesic effect of aerobic exercise, stimulate improved joint nutrition, and prepare body structures and systems for following interventions. x 6 minutes. Average SPM = 89. - STS from 17" chair: 1x6 with seated rest between. Relies on UE's on knees to assist in standing (unable without UE support).  - STS from 17" chair with UE support on TRX, 3x10 with SBA.    Side lying hip abduction:   LLE: 2# AW's,  3x10  RLE: 3x6, no resistance   Neuro Re-Ed:   Airex pad exercises (SBA unless otherwise stated) Sideways steps on aeromat beam: 8 laps CGA with two finger support. Good balance.    Pt required multimodal cuing for proper technique and to facilitate improved neuromuscular control, strength, range of motion, and functional ability resulting in improved performance and form. Requires CGA - SBA for balance exercises.   PATIENT EDUCATION:  Education details: form/technique with exercise. Person educated: Patient Education method: Explanation, demonstration, verbal cuing, tactile cuing. Education comprehension: verbalized understanding, demonstrated understanding, and needs further education     HOME EXERCISE PROGRAM: Access Code: [email or web address] URL: [email or web address] Date: 07/20/2021 Prepared by: Rosita Kea  Exercises - Sit to Stand  - 3-4 x weekly - 4 sets - 5 reps   ASSESSMENT:   CLINICAL IMPRESSION: Patient tolerated treatment well with some difficulty due to fatigued. Patient able to tolerate exercises with sufficient rest breaks to improve recovery in context of MD. Continued working on functional strength and hip strength as able. Patient continues to have deficits with balance, motor control, and functional strength. Patient would  benefit from continued management of limiting condition by skilled physical therapist to address remaining impairments and functional limitations to work towards stated goals and return to PLOF or maximal functional independence.  Patient is a 46 y.o. female referred to outpatient physical therapy with a medical diagnosis of bilateral leg weakness who presents with signs and symptoms consistent with generalized weakness, balance deficits, and motor control problems likely related to deconditioning and FSHD muscular dystrophy. Patient presents with significant balance, motor control, posture, postural, gait, pain, muscle performance  (strength/power/endurance) and activity tolerance impairments that are limiting ability to complete basic mobility and self care including ambulation, stairs, transfers, ADLs, and IADLs without difficulty. At eval requires assistance to navigate stairs, assistance with ADLs and  IADLs, and ambulates with RW when she previously did not need an assistive device. Patient would likely benefit from bilateral AFOs to address foot drop found at both ankles and improve her steppage gait. Patient will benefit from skilled physical therapy intervention to address current body structure impairments and activity limitations to improve function and work towards goals set in current POC in order to return to prior level of function or maximal functional improvement.      OBJECTIVE IMPAIRMENTS Abnormal gait, decreased activity tolerance, decreased balance, decreased coordination, decreased endurance, decreased mobility, difficulty walking, decreased ROM, decreased strength, impaired perceived functional ability, increased muscle spasms, impaired tone, impaired UE functional use, improper body mechanics, postural dysfunction, and pain.    ACTIVITY LIMITATIONS cleaning, community activity, driving, meal prep, laundry, and shopping.    PERSONAL FACTORS Behavior pattern, Fitness, Past/current experiences, Time since onset of injury/illness/exacerbation, Transportation, and 3+ comorbidities:    muscular dystrophy (FSHD - facioscapulohumeral muscular dystrophy), anxiety and depression, bipolar 1 disorder, borderline personality disorder, diabetes, GERD, gout, pancreatitis, alcoholic liver disease, tobacco abuse,  foot drop, neuropathy, recovering alcoholic, history of drug abuse, history of hernia repair are also affecting patient's functional outcome.      REHAB POTENTIAL: Good   CLINICAL DECISION MAKING: Evolving/moderate complexity   EVALUATION COMPLEXITY: Moderate     GOALS: Goals reviewed with patient? No    SHORT TERM GOALS: Target date: 07/24/2021   Patient will be independent with initial home exercise program for self-management of symptoms. Baseline: Initial HEP to be provided at visit 2 as appropriate (07/10/21); Goal status: Met     LONG TERM GOALS: Target date: 10/02/2021   Patient will be independent with a long-term home exercise program for self-management of symptoms.  Baseline: Initial HEP to be provided at visit 2 as appropriate (07/10/21); participating as tolerated, recently too much pain to participate (09/04/2021);  Goal status: In-Progress   2.  Patient will demonstrate improved FOTO score by 10 points from baseline to demonstrate improvement in overall condition and self-reported functional ability.  Baseline: to be measured visit 2 as appropriate (07/10/21); 46 at visit # 2 (07/17/2021); 50 at visit # 10 (09/04/2021); Goal status: In-Progress   3.  Patient will ambulate equal or greater than 1000 feet during 6 Min Walk Test using [facility] or less restrictive assistive device to improve community ambulation.  Baseline: To be tested visit 2 as appropriate (07/10/21); 310 feet with RW (07/17/2021); 556 feet with rollator (09/04/2021);  Goal status: In-Progress   4.  Patient will complete 5 Time Sit To Stand test in equal or less than 20 seconds with no UE support from 18.5 inch plinth to demonstrate improved LE functional strength and power for basic mobility and improved fall risk.  Baseline: 24 seconds  with B UE on 18.5 inch plinth.  (07/10/21); 20 seconds with B UE on thighs from 18.5 inch plinth (09/04/2021);  Goal status: In-Progress   5.  Patient be able to ascend and descend stairs at her home mod I with use of B handrails.  Baseline: able to ascend/descend 4 steps in the clinic with B UE support on handrails and supervision - reports currently needs assistance to descend steps at home. (07/10/21); able to ascend/descend 4 steps in the clinic with B UE support on handrails and  supervision, leading up and down with left. able to perform with reversed LE leads (09/04/2021); Goal status: In-Progress   6.  Patient will demonstrate ability to perform static balance in tandem stance for equal or greater than 30 seconds on each side, to demonstrate improved balance and decreased fall risk.  Baseline: right foot front 10 seconds, left foot front 7 seconds (07/10/2021); right foot front 4 seconds, left foot front 6 seconds (09/04/2021);  Goal status: In-Progress     PLAN: PT FREQUENCY: 2x/week   PT DURATION: 12 weeks   PLANNED INTERVENTIONS: Therapeutic exercises, Therapeutic activity, Neuromuscular re-education, Balance training, Gait training, Patient/Family education, Joint mobilization, Orthotic/Fit training, DME instructions, Dry Needling, Electrical stimulation, Cryotherapy, Moist heat, Splintting, Taping, and Manual therapy   PLAN FOR NEXT SESSION: continue with moderate intensity functional strengthening and balance exercises.     Everlean Alstrom. Graylon Good, PT, DPT 09/20/21, 1:49 PM  [facility] [address] [place], [place] [address] P: [phone] I F: [phone]

## 2021-09-25 ENCOUNTER — Ambulatory Visit: Payer: Medicare Other | Admitting: Physical Therapy

## 2021-09-25 ENCOUNTER — Encounter: Payer: Self-pay | Admitting: Physical Therapy

## 2021-09-25 DIAGNOSIS — R2689 Other abnormalities of gait and mobility: Secondary | ICD-10-CM

## 2021-09-25 DIAGNOSIS — M6281 Muscle weakness (generalized): Secondary | ICD-10-CM

## 2021-09-25 DIAGNOSIS — Z9181 History of falling: Secondary | ICD-10-CM

## 2021-09-25 NOTE — Therapy (Signed)
OUTPATIENT PHYSICAL THERAPY TREATMENT NOTE   Patient Name: [name] MRN: [id number] DOB:08/03/1975, 46 y.o., female Today's Date: 09/25/2021  PCP: Janine Limbo, PA-C REFERRING PROVIDER: Janine Limbo, PA-C  END OF SESSION:   PT End of Session - 09/25/21 1307     Visit Number 14    Number of Visits 24    Date for PT Re-Evaluation 10/02/21    Authorization Type [employer] MEDICARE/Medicaid reporting period from 09/04/2021    Progress Note Due on Visit 10    PT Start Time 1304    PT Stop Time 1342    PT Time Calculation (min) 38 min    Equipment Utilized During Treatment Gait belt    Activity Tolerance Patient tolerated treatment well    Behavior During Therapy WFL for tasks assessed/performed                Past Medical History:  Diagnosis Date   Alcohol abuse    Anxiety    Anxiety    Anxiety and depression    Asthma    Bipolar disorder ([place])    Borderline personality disorder ([place])    Depression    Diabetes ([place])    Type 2    Dyslipidemia    Esophageal varices ([name])    Foot drop    right   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[place])    GERD (gastroesophageal reflux disease)    Gout    History of drug abuse ([place])    Insomnia    Muscular dystrophies ([place])    Neuropathy    Pancreatitis    Recovering alcoholic ([place])    Past Surgical History:  Procedure Laterality Date   HERNIA REPAIR     IR FLUORO GUIDE CV LINE RIGHT  12/30/2020   IR REMOVAL TUN CV CATH W/O FL  01/19/2021   IR US GUIDE VASC ACCESS RIGHT  12/30/2020   Patient Active Problem List   Diagnosis Date Noted   Frequent falls 02/12/2021   Leukocytosis    Aspiration pneumonia of both lower lobes due to gastric secretions ([place]) 01/04/2021   Pressure injury of skin 12/24/2020   Acute hepatic encephalopathy ([place]) 12/20/2020   Sepsis ([place]) 12/19/2020   Acute hyponatremia 12/18/2020   Alcohol use disorder, moderate, dependence ([place]) 12/18/2020   Hepatic steatosis 12/18/2020   Polysubstance  abuse ([place]) 12/18/2020   Hyperglycemia due to type 2 diabetes mellitus ([place]) 12/18/2020   Hyperbilirubinemia 12/18/2020   Lactic acidosis 12/09/3005   Alcoholic liver disease ([place]) 12/18/2020   Bipolar 1 disorder, manic, moderate ([place]) 09/10/2018   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   Diabetes ([place]) 09/10/2018   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[place]) 03/28/2018   Chronic gout of foot 09/29/2015   Muscular dystrophy ([place]) 09/29/2015   Type 2 diabetes mellitus without complication ([place]) 62/26/3335   Anxiety and depression 10/01/2012   Protein-calorie malnutrition, severe ([place]) 09/30/2012   Alcohol abuse 09/26/2012   Trichomonas infection 09/26/2012   Dehydration 09/26/2012   Hypokalemia 11/14/2011   Thrombocytopenia ([place]) 11/14/2011   Alcohol withdrawal ([place]) 11/13/2011   Bipolar 2 disorder ([place]) 11/13/2011   Tobacco abuse 11/13/2011   Cocaine abuse ([place]) 11/13/2011   Gout 11/13/2011     REFERRING DIAG: bilateral leg weakness  THERAPY DIAG:  Other abnormalities of gait and mobility  Muscle weakness (generalized)  History of falling  Rationale for Evaluation and Treatment Rehabilitation  PERTINENT HISTORY: Patient is a 46 y.o. female who presents to outpatient physical therapy with a referral for medical diagnosis bilateral  leg weakness. This patient's chief complaints consist of difficulty with weakness and balance with history of frequent falling leading to the following functional deficits: difficulty with basic mobility including ambulation, stairs, transfers; difficulty with ADLs, IADLs. Currently requires assistance to navigate stairs, assistance with ADLs and  IADLs, and ambulates with RW when she previously did not need an assistive device. Relevant past medical history and comorbidities include muscular dystrophy (FSHD - facioscapulohumeral muscular dystrophy), anxiety and depression, bipolar 1 disorder, borderline personality disorder, diabetes, GERD, gout,  pancreatitis, alcoholic liver disease, tobacco abuse,  foot drop, neuropathy, recovering alcoholic, history of drug abuse, history of hernia repair.  Patient denies hx of cancer, stroke, seizures, lung problems, heart problems, unexplained weight loss, unexplained changes in bowel or bladder problems, unexplained stumbling or dropping things, osteoporosis, and spinal surgery  PRECAUTIONS: Fall, not drink alcohol.  SUBJECTIVE: Patient arrives with rollator. She states she overdid it on Friday cooking a big meal, which she loves to do. She has increased pain in her low back and right hip. She states she was "pretty beat" when she got home after last PT session. She did sit to stands at home but not sidelying hip abduction.   PAIN:   5-6/10 NRPS at right low back/glute region.  OBJECTIVE  TODAY'S TREATMENT: Therapeutic exercise: to centralize symptoms and improve ROM, strength, muscular endurance, and activity tolerance required for successful completion of functional activities.  - NuStep level 1 using lower extremities. Seat/handle setting 9. For improved extremity mobility, muscular endurance, and activity tolerance; and to induce the analgesic effect of aerobic exercise, stimulate improved joint nutrition, and prepare body structures and systems for following interventions. x 6 minutes. Average SPM = 96.   Side lying hip abduction:   - LLE: 2# AW's, 4x6  - RLE: 3x6, no resistance  - Hooklying LTR, 2 min, self selected speed to decrease low back pain.  - hooklying posterior pelvic tilt, 1x6 - hooklying modified single leg stretch, 3x6 on each side. VC/TC to improve. Torso down, arms holding knees with abdominal muscles also engaged.  - hooklying articulated bridge, 3x6. VC/TC for form.    Pt required multimodal cuing for proper technique and to facilitate improved neuromuscular control, strength, range of motion, and functional ability resulting in improved performance and form. Requires CGA  - SBA for balance exercises.   PATIENT EDUCATION:  Education details: form/technique with exercise. Person educated: Patient Education method: Explanation, demonstration, verbal cuing, tactile cuing. Education comprehension: verbalized understanding, demonstrated understanding, and needs further education     HOME EXERCISE PROGRAM: Access Code: [email or web address] URL: [email or web address] Date: 07/20/2021 Prepared by: Rosita Kea  Exercises - Sit to Stand  - 3-4 x weekly - 4 sets - 5 reps   ASSESSMENT:   CLINICAL IMPRESSION: Patient arrives with elevated pain and fatigue so exercise intensity modified to accommodate her activity tolerance today. Session focused on core exercises and generalized activity tolerance to improve back pain. Patient continues to have a lot of difficulty with balance, placing her at high fall risk, and limitations in functional mobility. Plan to continue working on functional and LE strength and balance next session as tolerated. Patient would benefit from continued management of limiting condition by skilled physical therapist to address remaining impairments and functional limitations to work towards stated goals and return to PLOF or maximal functional independence.   Patient is a 46 y.o. female referred to outpatient physical therapy with a medical diagnosis of bilateral leg weakness who presents with signs and symptoms consistent  with generalized weakness, balance deficits, and motor control problems likely related to deconditioning and FSHD muscular dystrophy. Patient presents with significant balance, motor control, posture, postural, gait, pain, muscle performance (strength/power/endurance) and activity tolerance impairments that are limiting ability to complete basic mobility and self care including ambulation, stairs, transfers, ADLs, and IADLs without difficulty. At eval requires assistance to navigate stairs, assistance with ADLs and  IADLs, and  ambulates with RW when she previously did not need an assistive device. Patient would likely benefit from bilateral AFOs to address foot drop found at both ankles and improve her steppage gait. Patient will benefit from skilled physical therapy intervention to address current body structure impairments and activity limitations to improve function and work towards goals set in current POC in order to return to prior level of function or maximal functional improvement.      OBJECTIVE IMPAIRMENTS Abnormal gait, decreased activity tolerance, decreased balance, decreased coordination, decreased endurance, decreased mobility, difficulty walking, decreased ROM, decreased strength, impaired perceived functional ability, increased muscle spasms, impaired tone, impaired UE functional use, improper body mechanics, postural dysfunction, and pain.    ACTIVITY LIMITATIONS cleaning, community activity, driving, meal prep, laundry, and shopping.    PERSONAL FACTORS Behavior pattern, Fitness, Past/current experiences, Time since onset of injury/illness/exacerbation, Transportation, and 3+ comorbidities:    muscular dystrophy (FSHD - facioscapulohumeral muscular dystrophy), anxiety and depression, bipolar 1 disorder, borderline personality disorder, diabetes, GERD, gout, pancreatitis, alcoholic liver disease, tobacco abuse,  foot drop, neuropathy, recovering alcoholic, history of drug abuse, history of hernia repair are also affecting patient's functional outcome.      REHAB POTENTIAL: Good   CLINICAL DECISION MAKING: Evolving/moderate complexity   EVALUATION COMPLEXITY: Moderate     GOALS: Goals reviewed with patient? No   SHORT TERM GOALS: Target date: 07/24/2021   Patient will be independent with initial home exercise program for self-management of symptoms. Baseline: Initial HEP to be provided at visit 2 as appropriate (07/10/21); Goal status: Met     LONG TERM GOALS: Target date: 10/02/2021   Patient  will be independent with a long-term home exercise program for self-management of symptoms.  Baseline: Initial HEP to be provided at visit 2 as appropriate (07/10/21); participating as tolerated, recently too much pain to participate (09/04/2021);  Goal status: In-Progress   2.  Patient will demonstrate improved FOTO score by 10 points from baseline to demonstrate improvement in overall condition and self-reported functional ability.  Baseline: to be measured visit 2 as appropriate (07/10/21); 46 at visit # 2 (07/17/2021); 50 at visit # 10 (09/04/2021); Goal status: In-Progress   3.  Patient will ambulate equal or greater than 1000 feet during 6 Min Walk Test using [facility] or less restrictive assistive device to improve community ambulation.  Baseline: To be tested visit 2 as appropriate (07/10/21); 310 feet with RW (07/17/2021); 556 feet with rollator (09/04/2021);  Goal status: In-Progress   4.  Patient will complete 5 Time Sit To Stand test in equal or less than 20 seconds with no UE support from 18.5 inch plinth to demonstrate improved LE functional strength and power for basic mobility and improved fall risk.  Baseline: 24 seconds with B UE on 18.5 inch plinth.  (07/10/21); 20 seconds with B UE on thighs from 18.5 inch plinth (09/04/2021);  Goal status: In-Progress   5.  Patient be able to ascend and descend stairs at her home mod I with use of B handrails.  Baseline: able to ascend/descend 4 steps in the clinic with  B UE support on handrails and supervision - reports currently needs assistance to descend steps at home. (07/10/21); able to ascend/descend 4 steps in the clinic with B UE support on handrails and supervision, leading up and down with left. able to perform with reversed LE leads (09/04/2021); Goal status: In-Progress   6.  Patient will demonstrate ability to perform static balance in tandem stance for equal or greater than 30 seconds on each side, to demonstrate improved balance and  decreased fall risk.  Baseline: right foot front 10 seconds, left foot front 7 seconds (07/10/2021); right foot front 4 seconds, left foot front 6 seconds (09/04/2021);  Goal status: In-Progress     PLAN: PT FREQUENCY: 2x/week   PT DURATION: 12 weeks   PLANNED INTERVENTIONS: Therapeutic exercises, Therapeutic activity, Neuromuscular re-education, Balance training, Gait training, Patient/Family education, Joint mobilization, Orthotic/Fit training, DME instructions, Dry Needling, Electrical stimulation, Cryotherapy, Moist heat, Splintting, Taping, and Manual therapy   PLAN FOR NEXT SESSION: continue with moderate intensity functional strengthening and balance exercises.     Everlean Alstrom. Graylon Good, PT, DPT 09/25/21, 1:48 PM  [facility] [address] [place], [place] [address] P: [phone] I F: [phone]

## 2021-09-27 ENCOUNTER — Ambulatory Visit: Payer: Medicare Other | Admitting: Physical Therapy

## 2021-10-02 ENCOUNTER — Ambulatory Visit: Payer: Medicare Other | Admitting: Physical Therapy

## 2021-10-04 ENCOUNTER — Ambulatory Visit: Payer: Medicare Other | Admitting: Physical Therapy

## 2021-10-04 ENCOUNTER — Encounter: Payer: Self-pay | Admitting: Physical Therapy

## 2021-10-04 DIAGNOSIS — R2689 Other abnormalities of gait and mobility: Secondary | ICD-10-CM

## 2021-10-04 DIAGNOSIS — Z9181 History of falling: Secondary | ICD-10-CM | POA: Diagnosis not present

## 2021-10-04 DIAGNOSIS — M6281 Muscle weakness (generalized): Secondary | ICD-10-CM

## 2021-10-04 NOTE — Therapy (Signed)
OUTPATIENT PHYSICAL THERAPY TREATMENT NOTE / PROGRESS NOTE / RE-CERTIFICATION Dates of reporting 09/04/2021 to 10/04/2021   Patient Name: [name] MRN: [id number] DOB:03/25/1975, 46 y.o., female Today's Date: 10/04/2021  PCP: Greta O'Buch, PA-C REFERRING PROVIDER: Greta O'Buch, PA-C  END OF SESSION:   PT End of Session - 10/04/21 1436     Visit Number 15    Number of Visits 24    Date for PT Re-Evaluation 12/27/21    Authorization Type [employer] MEDICARE/Medicaid reporting period from 09/04/2021    Progress Note Due on Visit 10    PT Start Time 1350    PT Stop Time 1435    PT Time Calculation (min) 45 min    Equipment Utilized During Treatment Gait belt    Activity Tolerance Patient tolerated treatment well    Behavior During Therapy WFL for tasks assessed/performed              Past Medical History:  Diagnosis Date   Alcohol abuse    Anxiety    Anxiety    Anxiety and depression    Asthma    Bipolar disorder ([name])    Borderline personality disorder ([name])    Depression    Diabetes ([name])    Type 2    Dyslipidemia    Esophageal varices ([name])    Foot drop    right   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[name])    GERD (gastroesophageal reflux disease)    Gout    History of drug abuse ([name])    Insomnia    Muscular dystrophies ([name])    Neuropathy    Pancreatitis    Recovering alcoholic ([name])    Past Surgical History:  Procedure Laterality Date   HERNIA REPAIR     IR FLUORO GUIDE CV LINE RIGHT  12/30/2020   IR REMOVAL TUN CV CATH W/O FL  01/19/2021   IR US GUIDE VASC ACCESS RIGHT  12/30/2020   Patient Active Problem List   Diagnosis Date Noted   Frequent falls 02/12/2021   Leukocytosis    Aspiration pneumonia of both lower lobes due to gastric secretions ([name]) 01/04/2021   Pressure injury of skin 12/24/2020   Acute hepatic encephalopathy ([name]) 12/20/2020   Sepsis ([name]) 12/19/2020   Acute hyponatremia 12/18/2020   Alcohol use disorder, moderate,  dependence ([name]) 12/18/2020   Hepatic steatosis 12/18/2020   Polysubstance abuse ([name]) 12/18/2020   Hyperglycemia due to type 2 diabetes mellitus ([name]) 12/18/2020   Hyperbilirubinemia 12/18/2020   Lactic acidosis 12/18/2020   Alcoholic liver disease ([name]) 12/18/2020   Bipolar 1 disorder, manic, moderate ([name]) 09/10/2018   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   Diabetes ([name]) 09/10/2018   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   Chronic gout of foot 09/29/2015   Muscular dystrophy ([name]) 09/29/2015   Type 2 diabetes mellitus without complication ([name]) 09/29/2015   Anxiety and depression 10/01/2012   Protein-calorie malnutrition, severe ([name]) 09/30/2012   Alcohol abuse 09/26/2012   Trichomonas infection 09/26/2012   Dehydration 09/26/2012   Hypokalemia 11/14/2011   Thrombocytopenia ([name]) 11/14/2011   Alcohol withdrawal ([name]) 11/13/2011   Bipolar 2 disorder ([name]) 11/13/2011   Tobacco abuse 11/13/2011   Cocaine abuse ([name]) 11/13/2011   Gout 11/13/2011     REFERRING DIAG: bilateral leg weakness  THERAPY DIAG:  Other abnormalities of gait and mobility  Muscle weakness (generalized)  History of falling  Rationale for Evaluation and Treatment Rehabilitation  PERTINENT HISTORY: Patient is a 46 y.o. female who presents to outpatient   physical therapy with a referral for medical diagnosis bilateral leg weakness. This patient's chief complaints consist of difficulty with weakness and balance with history of frequent falling leading to the following functional deficits: difficulty with basic mobility including ambulation, stairs, transfers; difficulty with ADLs, IADLs. Currently requires assistance to navigate stairs, assistance with ADLs and  IADLs, and ambulates with RW when she previously did not need an assistive device. Relevant past medical history and comorbidities include muscular dystrophy (FSHD - facioscapulohumeral muscular dystrophy), anxiety and depression,  bipolar 1 disorder, borderline personality disorder, diabetes, GERD, gout, pancreatitis, alcoholic liver disease, tobacco abuse,  foot drop, neuropathy, recovering alcoholic, history of drug abuse, history of hernia repair.  Patient denies hx of cancer, stroke, seizures, lung problems, heart problems, unexplained weight loss, unexplained changes in bowel or bladder problems, unexplained stumbling or dropping things, osteoporosis, and spinal surgery  PRECAUTIONS: Fall, not drink alcohol.  SUBJECTIVE: Patient reports she had break problems on her car last week and then earlier this week her cousin had to go to an appointment so she missed her last 2 PT appointments. Patient states she feels like she has been really busy and it felt good until it didn't. Today her low back hurts. She has been doing her sit <> stands every other day at home. She was sore for 2 hours in her abdomen after her last PT session. She arrives on rollator and she had one fall when she was reaching for something that fell under her bed. She didn't hurt anything but needed help from her cousin to get up. She has been working on going down steps step over step. She feels like PT is helping her. She has been doing more with less fatigue/pain than before.   PAIN:   5/10 NRPS at low back (R > L).   OBJECTIVE   SELF-REPORTED FUNCTION FOTO score: 53/100 (NOC-Neuromuscular disorder questionnaire)   FUNCTIONAL/BALANCE TESTS: 6 Minute Walk Test: 600 feet with rollator and steppage gait, R > L. Reports discomfort in B UE and low back. Stopped 5 seconds early due to fatigue.    Five Time Sit to Stand (5TSTS): 19 seconds with B UE on thighs from 18.5 inch plinth.    Static balance (best of 3 trials): tandem stance, eyes open: right foot front 9 seconds, left foot front 31 seconds.    Stairs: able to ascend/descend 4 steps in the clinic with B UE support on handrails and supervision, step over step with heavy UE use on the way down.      TODAY'S TREATMENT: Therapeutic exercise: to centralize symptoms and improve ROM, strength, muscular endurance, and activity tolerance required for successful completion of functional activities.  - testing to assess progress (see above).  - stair practice: 2x up/down 4 steps (6 inch) practicing with step over step gait pattern with B UE support.    Side lying hip abduction:   - LLE: 2# AW's, 4x6  - RLE: 3x6, no resistance  Pt required multimodal cuing for proper technique and to facilitate improved neuromuscular control, strength, range of motion, and functional ability resulting in improved performance and form. Requires CGA - SBA for balance exercises.   PATIENT EDUCATION:  Education details: form/technique with exercise. Person educated: Patient Education method: Explanation, demonstration, verbal cuing, tactile cuing. Education comprehension: verbalized understanding, demonstrated understanding, and needs further education     HOME EXERCISE PROGRAM: Access Code: [email or web address] URL: [email or web address] Date: 10/04/2021 Prepared by: Rosita Kea  Exercises - Sit to Stand  - 3-4  x weekly - 4 sets - 5 reps - Sidelying Hip Abduction  - 3-4 x weekly - 4 sets - 6 reps - 2 seconds hold    ASSESSMENT:   CLINICAL IMPRESSION: Patient has attended 15 physical therapy sessions since starting the current episode of care on 07/10/2021. She has been making steady progress towards goals but continues to be very limited in mobility and activity tolerance and would benefit from continued PT. She has bilateral foot drop, worse on the R compared to the left and would benefit from an AFO for the right to improve her gait pattern, efficiency of walking, and improve her fall risk. She is currently dependent on a rollator or UE support to prevent falls and continues to have occasional falls. Her progress is negatively affected by muscular dystrophy, which limits her ability to tolerate exercise  and impairs her strength, motor control, and endurance. Patient would benefit from continued management of limiting condition by skilled physical therapist to address remaining impairments and functional limitations to work towards stated goals and return to PLOF or maximal functional independence.   Patient is a 46 y.o. female referred to outpatient physical therapy with a medical diagnosis of bilateral leg weakness who presents with signs and symptoms consistent with generalized weakness, balance deficits, and motor control problems likely related to deconditioning and FSHD muscular dystrophy. Patient presents with significant balance, motor control, posture, postural, gait, pain, muscle performance (strength/power/endurance) and activity tolerance impairments that are limiting ability to complete basic mobility and self care including ambulation, stairs, transfers, ADLs, and IADLs without difficulty. At eval requires assistance to navigate stairs, assistance with ADLs and  IADLs, and ambulates with RW when she previously did not need an assistive device. Patient would likely benefit from bilateral AFOs to address foot drop found at both ankles and improve her steppage gait. Patient will benefit from skilled physical therapy intervention to address current body structure impairments and activity limitations to improve function and work towards goals set in current POC in order to return to prior level of function or maximal functional improvement.      OBJECTIVE IMPAIRMENTS Abnormal gait, decreased activity tolerance, decreased balance, decreased coordination, decreased endurance, decreased mobility, difficulty walking, decreased ROM, decreased strength, impaired perceived functional ability, increased muscle spasms, impaired tone, impaired UE functional use, improper body mechanics, postural dysfunction, and pain.    ACTIVITY LIMITATIONS cleaning, community activity, driving, meal prep, laundry, and  shopping.    PERSONAL FACTORS Behavior pattern, Fitness, Past/current experiences, Time since onset of injury/illness/exacerbation, Transportation, and 3+ comorbidities:    muscular dystrophy (FSHD - facioscapulohumeral muscular dystrophy), anxiety and depression, bipolar 1 disorder, borderline personality disorder, diabetes, GERD, gout, pancreatitis, alcoholic liver disease, tobacco abuse,  foot drop, neuropathy, recovering alcoholic, history of drug abuse, history of hernia repair are also affecting patient's functional outcome.      REHAB POTENTIAL: Good   CLINICAL DECISION MAKING: Evolving/moderate complexity   EVALUATION COMPLEXITY: Moderate     GOALS: Goals reviewed with patient? No   SHORT TERM GOALS: Target date: 07/24/2021   Patient will be independent with initial home exercise program for self-management of symptoms. Baseline: Initial HEP to be provided at visit 2 as appropriate (07/10/21); Goal status: Met     LONG TERM GOALS: Target date: 10/02/2021; updated to 12/27/2021 for all unmet goals on 10/04/2021.    Patient will be independent with a long-term home exercise program for self-management of symptoms.  Baseline: Initial HEP to be provided at visit 2 as   appropriate (07/10/21); participating as tolerated, recently too much pain to participate (09/04/2021); participating regularly (10/04/2021);  Goal status: In-Progress   2.  Patient will demonstrate improved FOTO score by 10 points from baseline to demonstrate improvement in overall condition and self-reported functional ability.  Baseline: to be measured visit 2 as appropriate (07/10/21); 46 at visit # 2 (07/17/2021); 50 at visit # 10 (09/04/2021); 53 at visit #15 (10/04/2021);;  Goal status: In-Progress   3.  Patient will ambulate equal or greater than 1000 feet during 6 Min Walk Test using SPC or less restrictive assistive device to improve community ambulation.  Baseline: To be tested visit 2 as appropriate (07/10/21); 310  feet with RW (07/17/2021); 556 feet with rollator (09/04/2021); 600 feet with rollator (10/04/2021);  Goal status: In-Progress   4.  Patient will complete 5 Time Sit To Stand test in equal or less than 20 seconds with no UE support from 18.5 inch plinth to demonstrate improved LE functional strength and power for basic mobility and improved fall risk.  Baseline: 24 seconds with B UE on 18.5 inch plinth.  (07/10/21); 20 seconds with B UE on thighs from 18.5 inch plinth (09/04/2021); 19 seconds with B UE on thighs from 18.5 inch plinth (10/04/2021);  Goal status: In-Progress   5.  Patient be able to ascend and descend stairs at her home mod I with use of B handrails.  Baseline: able to ascend/descend 4 steps in the clinic with B UE support on handrails and supervision - reports currently needs assistance to descend steps at home. (07/10/21); able to ascend/descend 4 steps in the clinic with B UE support on handrails and supervision, leading up and down with left. able to perform with reversed LE leads (09/04/2021); able to ascend/descend 4 steps in the clinic with B UE support on handrails and supervision, step over step with heavy UE use on the way down (10/04/2021); Goal status: In-Progress   6.  Patient will demonstrate ability to perform static balance in tandem stance for equal or greater than 30 seconds on each side, to demonstrate improved balance and decreased fall risk.  Baseline: right foot front 10 seconds, left foot front 7 seconds (07/10/2021); right foot front 4 seconds, left foot front 6 seconds (09/04/2021); right foot front 9 seconds, left foot front 31 seconds (10/04/2021);  Goal status: In-Progress     PLAN: PT FREQUENCY: 2x/week   PT DURATION: 12 weeks   PLANNED INTERVENTIONS: Therapeutic exercises, Therapeutic activity, Neuromuscular re-education, Balance training, Gait training, Patient/Family education, Joint mobilization, Orthotic/Fit training, DME instructions, Dry Needling,  Electrical stimulation, Cryotherapy, Moist heat, Splintting, Taping, and Manual therapy   PLAN FOR NEXT SESSION: continue with moderate intensity functional strengthening and balance exercises.      R. , PT, DPT 10/04/21, 7:59 PM  [facility] [address] [place], [place] [address] P: [phone] I F: [phone]       

## 2021-10-09 ENCOUNTER — Ambulatory Visit: Payer: Medicare Other | Admitting: Physical Therapy

## 2021-10-10 ENCOUNTER — Telehealth: Payer: Self-pay | Admitting: Physical Therapy

## 2021-10-10 NOTE — Telephone Encounter (Signed)
Called patient to check on her after she reported a fall yesterday and to help schedule more appointments so she can attend PT at 2x a week as planned. She state she is feeling a bit better from the fall but her whole back hurts and the muscles in her right knee feel like they are "twisting" when she puts weight on it. She states she fell when stepping down the small step she has in her house leading with the R foot. She is not sure exactly what happened but she usually holds on. She was not able to take any of the available openings for a second appointment each week in August.   Cecil Bixby R. Ilsa Iha, PT, DPT 10/10/21, 9:29 AM  [facility] [address] [place], [place] [address] P: [phone] I F: [phone]

## 2021-10-11 ENCOUNTER — Telehealth: Payer: Self-pay | Admitting: Physical Therapy

## 2021-10-11 ENCOUNTER — Ambulatory Visit: Payer: Medicare Other | Admitting: Physical Therapy

## 2021-10-11 ENCOUNTER — Encounter: Payer: Self-pay | Admitting: Physical Therapy

## 2021-10-11 DIAGNOSIS — Z9181 History of falling: Secondary | ICD-10-CM | POA: Diagnosis not present

## 2021-10-11 DIAGNOSIS — R2689 Other abnormalities of gait and mobility: Secondary | ICD-10-CM | POA: Diagnosis not present

## 2021-10-11 DIAGNOSIS — M6281 Muscle weakness (generalized): Secondary | ICD-10-CM | POA: Diagnosis not present

## 2021-10-11 NOTE — Therapy (Signed)
OUTPATIENT PHYSICAL THERAPY TREATMENT NOTE   Patient Name: [name] MRN: [id number] DOB:September 27, 1975, 46 y.o., female Today's Date: 10/11/2021  PCP: Janine Limbo, PA-C REFERRING PROVIDER: Janine Limbo, PA-C  END OF SESSION:   PT End of Session - 10/11/21 2002     Visit Number 16    Number of Visits 24    Date for PT Re-Evaluation 12/27/21    Authorization Type [employer] MEDICARE/Medicaid reporting period from 09/04/2021    Progress Note Due on Visit 10    PT Start Time 1350    PT Stop Time 1430    PT Time Calculation (min) 40 min    Activity Tolerance Patient limited by pain    Behavior During Therapy WFL for tasks assessed/performed               Past Medical History:  Diagnosis Date   Alcohol abuse    Anxiety    Anxiety    Anxiety and depression    Asthma    Bipolar disorder ([place])    Borderline personality disorder ([place])    Depression    Diabetes (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[place])    GERD (gastroesophageal reflux disease)    Gout    History of drug abuse ([place])    Insomnia    Muscular dystrophies ([place])    Neuropathy    Pancreatitis    Recovering alcoholic ([place])    Past Surgical History:  Procedure Laterality Date   HERNIA REPAIR     IR FLUORO GUIDE CV LINE RIGHT  12/30/2020   IR REMOVAL TUN CV CATH W/O FL  01/19/2021   IR US GUIDE VASC ACCESS RIGHT  12/30/2020   Patient Active Problem List   Diagnosis Date Noted   Frequent falls 02/12/2021   Leukocytosis    Aspiration pneumonia of both lower lobes due to gastric secretions ([place]) 01/04/2021   Pressure injury of skin 12/24/2020   Acute hepatic encephalopathy ([place]) 12/20/2020   Sepsis ([place]) 12/19/2020   Acute hyponatremia 12/18/2020   Alcohol use disorder, moderate, dependence ([place]) 12/18/2020   Hepatic steatosis 12/18/2020   Polysubstance abuse ([place]) 12/18/2020   Hyperglycemia due to type 2  diabetes mellitus ([place]) 12/18/2020   Hyperbilirubinemia 12/18/2020   Lactic acidosis 30/09/6224   Alcoholic liver disease ([place]) 12/18/2020   Bipolar 1 disorder, manic, moderate ([place]) 09/10/2018   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   Diabetes ([place]) 09/10/2018   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[place]) 03/28/2018   Chronic gout of foot 09/29/2015   Muscular dystrophy ([place]) 09/29/2015   Type 2 diabetes mellitus without complication ([employer]) 33/35/4562   Anxiety and depression 10/01/2012   Protein-calorie malnutrition, severe () 09/30/2012   Alcohol abuse 09/26/2012   Trichomonas infection 09/26/2012   Dehydration 09/26/2012   Hypokalemia 11/14/2011   Thrombocytopenia ([place]) 11/14/2011   Alcohol withdrawal ([place]) 11/13/2011   Bipolar 2 disorder ([place]) 11/13/2011   Tobacco abuse 11/13/2011   Cocaine abuse ([place]) 11/13/2011   Gout 11/13/2011     REFERRING DIAG: bilateral leg weakness  THERAPY DIAG:  Other abnormalities of gait and mobility  Muscle weakness (generalized)  History of falling  Rationale for Evaluation and Treatment Rehabilitation  PERTINENT HISTORY: Patient is a 46 y.o. female who presents to outpatient physical therapy with a referral for medical diagnosis bilateral leg weakness. This patient's chief complaints consist of difficulty with  weakness and balance with history of frequent falling leading to the following functional deficits: difficulty with basic mobility including ambulation, stairs, transfers; difficulty with ADLs, IADLs. Currently requires assistance to navigate stairs, assistance with ADLs and  IADLs, and ambulates with RW when she previously did not need an assistive device. Relevant past medical history and comorbidities include muscular dystrophy (FSHD - facioscapulohumeral muscular dystrophy), anxiety and depression, bipolar 1 disorder, borderline personality disorder, diabetes, GERD, gout, pancreatitis, alcoholic liver disease, tobacco abuse,   foot drop, neuropathy, recovering alcoholic, history of drug abuse, history of hernia repair.  Patient denies hx of cancer, stroke, seizures, lung problems, heart problems, unexplained weight loss, unexplained changes in bowel or bladder problems, unexplained stumbling or dropping things, osteoporosis, and spinal surgery  PRECAUTIONS: Fall, not drink alcohol.  SUBJECTIVE: Patient arrives with 7/10 pain in low back in right glute and down right leg to back of calf. She fell on Saturday when she stepped off the single step in her home leading with her L leg. She is unsure exactly how she fell. She needed help getting up and thought she was going to be okay until she woke up Sunday and had a lot of pain. She missed her Monday PT appointment because of the pain she was having. She reports her R knee hurts and it feels like the muscles are "twisted." She notes the pain in her right calf. Then yesterday she lost her balance and ended up sitting hard in a chair where the cushion was pushed off and her back started hurting even more than with the first fall. She really wanted to come to PT so she came today but feels she will need to do less. She is thinking about using her RW at home for a while to help prevent falls. She is next scheduled to see her PCP on Monday to start the Choose to Lose program that she has done before with her. She will be tracking everything she eats on the los it ap and sending it to her MD. States she took sudafed today.   PAIN:   7/10 NRPS at low back, R hip down back of leg to knee and calf.    OBJECTIVE  DVT Screen Wells score: 0 indicating the probability of DVT is low  Active cancer (treatment within last 6 months or palliative): 0 Calf swelling ? 3 cm compared to asymptomatic calf (10 cm below tibial tuberosity): 1 (did not measure cm) Swollen unilateral superficial veins (non-varicose, in symptomatic leg): 0  Unilateral pitting edema (in symptomatic leg): 0  Previous  documented DVT: 0  Swelling of entire leg: 0 Localized tenderness along the deep venous system: 1 Paralysis, paresis, or recent cast immobilization of lower extremities: 0 Recently bedridden ? 3 days, or major surgery in the past 12 weeks: 0 Alternative diagnosis at least as likely: ?2      <1     Probability of DVT is LOW (5%) 1 - 2  Probability of DVT is MODERATE (17%)  >2     Probability of DVT is HIGH (53%)  LOWER LIMB NEURODYNAMIC TESTS Straight Leg Raise (Sciatic nerve) R  = positive for concordant low back pain, questionable concordant knee pain.   L  = negative for concordant pain  PALPATION TTP at R inferior calf (not concordant), posterior knee, back of left thigh. Tender over lateral hamstrings but does not pull away.   ROM  R knee flexion with OP mildly tender  R knee extension with  mild OP strong concordant pain  R tibial IR and ER mid discomfort  R KNEE SPECIAL TESTS  R valgus and varus stress negative  Posterior drawer and anterior drawer negative  Sag sign negative McMurray's generally painful but not sharply and no clicks or catches.   MUSCLE LOADING TESTS  R calf concordant pain with resisted plantar flexion  R knee flexion painful with ER but no IR.  Unable to perform R hip abduction with hip in IR due to pain at knee.   TODAY'S TREATMENT: Therapeutic exercise: to centralize symptoms and improve ROM, strength, muscular endurance, and activity tolerance required for successful completion of functional activities.  - NuStep level 1 using lower extremities. Seat/handle setting 9. For improved extremity mobility, muscular endurance, and activity tolerance; and to induce the analgesic effect of aerobic exercise, stimulate improved joint nutrition, and prepare body structures and systems for following interventions. x 6 minutes. Average SPM = not recorded.  - seated LAQ with 5#AW, 4x6 each side.    Side lying hip abduction:   - LLE: 2# AW's, 4x6  - RLE: unable  due to R postiero knee pain.   - examination of R knee and leg (see above).  - practice with ascending and descending 6 inch steps with B UE support. Up/down 4 steps x2, practicing stepping down with R or L foot.   Pt required multimodal cuing for proper technique and to facilitate improved neuromuscular control, strength, range of motion, and functional ability resulting in improved performance and form. Requires CGA - SBA for balance exercises.   PATIENT EDUCATION:  Education details: form/technique with exercise. Person educated: Patient Education method: Explanation, demonstration, verbal cuing, tactile cuing. Education comprehension: verbalized understanding, demonstrated understanding, and needs further education     HOME EXERCISE PROGRAM: Access Code: [email or web address] URL: [email or web address] Date: 10/04/2021 Prepared by: Rosita Kea  Exercises - Sit to Stand  - 3-4 x weekly - 4 sets - 5 reps - Sidelying Hip Abduction  - 3-4 x weekly - 4 sets - 6 reps - 2 seconds hold    ASSESSMENT:   CLINICAL IMPRESSION: Patient arrives with elevated pain after fall down a step in her home on Saturday and a second uncontrolled fall into hard chair yesterday. Her right LE has been hurting more since the first fall and was further examined today. Per Wells Criteria for DVT she has low suspicion of pain in her calf from DVT. She does have symptoms of radiculopathy on the right side that seem to have worsened after the fall and her R SLR is positive for low back pain, less clear if it is positive for posterior knee pain. She does have increased pain here with resisted knee flexion with tibia in IR and with ankle plantar flexion, as well as concordant pain with knee hyperextension over pressure that suggests a possible hyperextension injury possibly effecting biceps femoris. Plan to monitor over the next week and refer for further medical assessment if it fails to improve. Also advised patient  to contact PCP if she started to have worsening mobility or further concern about her knee before next week.  Patient encouraged to use RW at home to prevent further falls. Patient informed that order for AFO was faxed to [facility] and advised to call them if she does not hear from them in the next week. Patient was able to tolerate interventions today with some difficulty and was unable to perform R hip abduction due to pain at the right knee. Patient also  requested frequent sitting breaks due to increased pain with standing. Patient would benefit from continued management of limiting condition by skilled physical therapist to address remaining impairments and functional limitations to work towards stated goals and return to PLOF or maximal functional independence.   Patient is a 46 y.o. female referred to outpatient physical therapy with a medical diagnosis of bilateral leg weakness who presents with signs and symptoms consistent with generalized weakness, balance deficits, and motor control problems likely related to deconditioning and FSHD muscular dystrophy. Patient presents with significant balance, motor control, posture, postural, gait, pain, muscle performance (strength/power/endurance) and activity tolerance impairments that are limiting ability to complete basic mobility and self care including ambulation, stairs, transfers, ADLs, and IADLs without difficulty. At eval requires assistance to navigate stairs, assistance with ADLs and  IADLs, and ambulates with RW when she previously did not need an assistive device. Patient would likely benefit from bilateral AFOs to address foot drop found at both ankles and improve her steppage gait. Patient will benefit from skilled physical therapy intervention to address current body structure impairments and activity limitations to improve function and work towards goals set in current POC in order to return to prior level of function or maximal functional improvement.    OBJECTIVE IMPAIRMENTS Abnormal gait, decreased activity tolerance, decreased balance, decreased coordination, decreased endurance, decreased mobility, difficulty walking, decreased ROM, decreased strength, impaired perceived functional ability, increased muscle spasms, impaired tone, impaired UE functional use, improper body mechanics, postural dysfunction, and pain.    ACTIVITY LIMITATIONS cleaning, community activity, driving, meal prep, laundry, and shopping.    PERSONAL FACTORS Behavior pattern, Fitness, Past/current experiences, Time since onset of injury/illness/exacerbation, Transportation, and 3+ comorbidities:    muscular dystrophy (FSHD - facioscapulohumeral muscular dystrophy), anxiety and depression, bipolar 1 disorder, borderline personality disorder, diabetes, GERD, gout, pancreatitis, alcoholic liver disease, tobacco abuse,  foot drop, neuropathy, recovering alcoholic, history of drug abuse, history of hernia repair are also affecting patient's functional outcome.      REHAB POTENTIAL: Good   CLINICAL DECISION MAKING: Evolving/moderate complexity   EVALUATION COMPLEXITY: Moderate     GOALS: Goals reviewed with patient? No   SHORT TERM GOALS: Target date: 07/24/2021   Patient will be independent with initial home exercise program for self-management of symptoms. Baseline: Initial HEP to be provided at visit 2 as appropriate (07/10/21); Goal status: Met     LONG TERM GOALS: Target date: 10/02/2021; updated to 12/27/2021 for all unmet goals on 10/04/2021.    Patient will be independent with a long-term home exercise program for self-management of symptoms.  Baseline: Initial HEP to be provided at visit 2 as appropriate (07/10/21); participating as tolerated, recently too much pain to participate (09/04/2021); participating regularly (10/04/2021);  Goal status: In-Progress   2.  Patient will demonstrate improved FOTO score by 10 points from baseline to demonstrate improvement  in overall condition and self-reported functional ability.  Baseline: to be measured visit 2 as appropriate (07/10/21); 46 at visit # 2 (07/17/2021); 50 at visit # 10 (09/04/2021); 53 at visit #15 (10/04/2021);;  Goal status: In-Progress   3.  Patient will ambulate equal or greater than 1000 feet during 6 Min Walk Test using [facility] or less restrictive assistive device to improve community ambulation.  Baseline: To be tested visit 2 as appropriate (07/10/21); 310 feet with RW (07/17/2021); 556 feet with rollator (09/04/2021); 600 feet with rollator (10/04/2021);  Goal status: In-Progress   4.  Patient will complete 5 Time Sit To Stand test  in equal or less than 20 seconds with no UE support from 18.5 inch plinth to demonstrate improved LE functional strength and power for basic mobility and improved fall risk.  Baseline: 24 seconds with B UE on 18.5 inch plinth.  (07/10/21); 20 seconds with B UE on thighs from 18.5 inch plinth (09/04/2021); 19 seconds with B UE on thighs from 18.5 inch plinth (10/04/2021);  Goal status: In-Progress   5.  Patient be able to ascend and descend stairs at her home mod I with use of B handrails.  Baseline: able to ascend/descend 4 steps in the clinic with B UE support on handrails and supervision - reports currently needs assistance to descend steps at home. (07/10/21); able to ascend/descend 4 steps in the clinic with B UE support on handrails and supervision, leading up and down with left. able to perform with reversed LE leads (09/04/2021); able to ascend/descend 4 steps in the clinic with B UE support on handrails and supervision, step over step with heavy UE use on the way down (10/04/2021); Goal status: In-Progress   6.  Patient will demonstrate ability to perform static balance in tandem stance for equal or greater than 30 seconds on each side, to demonstrate improved balance and decreased fall risk.  Baseline: right foot front 10 seconds, left foot front 7 seconds (07/10/2021);  right foot front 4 seconds, left foot front 6 seconds (09/04/2021); right foot front 9 seconds, left foot front 31 seconds (10/04/2021);  Goal status: In-Progress     PLAN: PT FREQUENCY: 2x/week   PT DURATION: 12 weeks   PLANNED INTERVENTIONS: Therapeutic exercises, Therapeutic activity, Neuromuscular re-education, Balance training, Gait training, Patient/Family education, Joint mobilization, Orthotic/Fit training, DME instructions, Dry Needling, Electrical stimulation, Cryotherapy, Moist heat, Splintting, Taping, and Manual therapy   PLAN FOR NEXT SESSION: continue with moderate intensity functional strengthening and balance exercises.     Everlean Alstrom. Graylon Good, PT, DPT 10/11/21, 8:13 PM  [facility] [address] [place], [place] [address] P: [phone] I F: [phone]

## 2021-10-11 NOTE — Telephone Encounter (Signed)
Called patient back as she requested since she fell into a hard chair this morning. Patient states she would like to come to PT and feels she can tolerate it if PT has her do easier exercises. PT recommends she come to her session, so she agreed to come.   [name]. Ilsa Iha, PT, DPT 10/11/21, 11:06 AM  [facility] [address] [place], [place] [address] P: [phone] I F: [phone]

## 2021-10-15 DIAGNOSIS — R531 Weakness: Secondary | ICD-10-CM | POA: Diagnosis not present

## 2021-10-16 DIAGNOSIS — R0602 Shortness of breath: Secondary | ICD-10-CM | POA: Diagnosis not present

## 2021-10-16 DIAGNOSIS — K7460 Unspecified cirrhosis of liver: Secondary | ICD-10-CM | POA: Diagnosis not present

## 2021-10-16 DIAGNOSIS — E1142 Type 2 diabetes mellitus with diabetic polyneuropathy: Secondary | ICD-10-CM | POA: Diagnosis not present

## 2021-10-18 ENCOUNTER — Ambulatory Visit: Payer: Medicare Other | Admitting: Physical Therapy

## 2021-10-23 ENCOUNTER — Encounter: Payer: Self-pay | Admitting: Physical Therapy

## 2021-10-23 ENCOUNTER — Ambulatory Visit: Payer: Medicare Other | Attending: Internal Medicine | Admitting: Physical Therapy

## 2021-10-23 DIAGNOSIS — M6281 Muscle weakness (generalized): Secondary | ICD-10-CM | POA: Diagnosis not present

## 2021-10-23 DIAGNOSIS — R2689 Other abnormalities of gait and mobility: Secondary | ICD-10-CM | POA: Insufficient documentation

## 2021-10-23 DIAGNOSIS — Z9181 History of falling: Secondary | ICD-10-CM | POA: Insufficient documentation

## 2021-10-23 NOTE — Therapy (Addendum)
OUTPATIENT PHYSICAL THERAPY TREATMENT NOTE   Patient Name: [name] MRN: [id number] DOB:Jun 10, 1975, 46 y.o., female Today's Date: 10/23/2021  PCP: Janine Limbo, PA-C REFERRING PROVIDER: Janine Limbo, PA-C  END OF SESSION:   PT End of Session - 10/23/21 1308     Visit Number 17    Number of Visits 24    Date for PT Re-Evaluation 12/27/21    Authorization Type [employer] MEDICARE/Medicaid reporting period from 09/04/2021    Progress Note Due on Visit 10    PT Start Time 1300    PT Stop Time 1340    PT Time Calculation (min) 40 min    Equipment Utilized During Treatment Gait belt    Activity Tolerance Patient limited by pain    Behavior During Therapy WFL for tasks assessed/performed                Past Medical History:  Diagnosis Date   Alcohol abuse    Anxiety    Anxiety    Anxiety and depression    Asthma    Bipolar disorder ([place])    Borderline personality disorder ([place])    Depression    Diabetes ([place])    Type 2    Dyslipidemia    Esophageal varices ([name])    Foot drop    right   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[place])    GERD (gastroesophageal reflux disease)    Gout    History of drug abuse ([place])    Insomnia    Muscular dystrophies ([place])    Neuropathy    Pancreatitis    Recovering alcoholic ([place])    Past Surgical History:  Procedure Laterality Date   HERNIA REPAIR     IR FLUORO GUIDE CV LINE RIGHT  12/30/2020   IR REMOVAL TUN CV CATH W/O FL  01/19/2021   IR US GUIDE VASC ACCESS RIGHT  12/30/2020   Patient Active Problem List   Diagnosis Date Noted   Frequent falls 02/12/2021   Leukocytosis    Aspiration pneumonia of both lower lobes due to gastric secretions ([place]) 01/04/2021   Pressure injury of skin 12/24/2020   Acute hepatic encephalopathy ([place]) 12/20/2020   Sepsis ([place]) 12/19/2020   Acute hyponatremia 12/18/2020   Alcohol use disorder, moderate, dependence ([place]) 12/18/2020   Hepatic steatosis 12/18/2020   Polysubstance abuse  ([place]) 12/18/2020   Hyperglycemia due to type 2 diabetes mellitus ([place]) 12/18/2020   Hyperbilirubinemia 12/18/2020   Lactic acidosis 67/61/9509   Alcoholic liver disease ([place]) 12/18/2020   Bipolar 1 disorder, manic, moderate ([place]) 09/10/2018   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) 03/28/2018   Chronic gout of foot 09/29/2015   Muscular dystrophy ([place]) 09/29/2015   Type 2 diabetes mellitus without complication ([place]) 32/67/1245   Anxiety and depression 10/01/2012   Protein-calorie malnutrition, severe ([place]) 09/30/2012   Alcohol abuse 09/26/2012   Trichomonas infection 09/26/2012   Dehydration 09/26/2012   Hypokalemia 11/14/2011   Thrombocytopenia ([place]) 11/14/2011   Alcohol withdrawal ([place]) 11/13/2011   Bipolar 2 disorder ([place]) 11/13/2011   Tobacco abuse 11/13/2011   Cocaine abuse ([place]) 11/13/2011   Gout 11/13/2011     REFERRING DIAG: bilateral leg weakness  THERAPY DIAG:  Other abnormalities of gait and mobility  Muscle weakness (generalized)  History of falling  Rationale for Evaluation and Treatment Rehabilitation  PERTINENT HISTORY: Patient is a 46 y.o. female who presents to outpatient physical therapy with a referral for medical diagnosis bilateral  leg weakness. This patient's chief complaints consist of difficulty with weakness and balance with history of frequent falling leading to the following functional deficits: difficulty with basic mobility including ambulation, stairs, transfers; difficulty with ADLs, IADLs. Currently requires assistance to navigate stairs, assistance with ADLs and  IADLs, and ambulates with RW when she previously did not need an assistive device. Relevant past medical history and comorbidities include muscular dystrophy (FSHD - facioscapulohumeral muscular dystrophy), anxiety and depression, bipolar 1 disorder, borderline personality disorder, diabetes, GERD, gout,  pancreatitis, alcoholic liver disease, tobacco abuse,  foot drop, neuropathy, recovering alcoholic, history of drug abuse, history of hernia repair.  Patient denies hx of cancer, stroke, seizures, lung problems, heart problems, unexplained weight loss, unexplained changes in bowel or bladder problems, unexplained stumbling or dropping things, osteoporosis, and spinal surgery  PRECAUTIONS: Fall, not drink alcohol.  SUBJECTIVE: Patient arrives on rollator. She continues to feel her muscles "twist" in her right knee. She missed her PT appointment last week due to falling when she stepped down from the step in her home leading with the left LE. She has changed the way she is going up and down the step on the other side of the step, with her walker, and making sure she leads with her right LE. She states she has used a lot of biofreeze. She is hurting but is able to come to PT. She has an appointment with [facility] on 11/16/2021. She had one with them last week but had to reschedule it. She has not been doing any HEP since last week. She states she felt fine after last PT session.   PAIN:   6/10 NRPS at low back, R SIJ and towards hip and upper back.   OBJECTIVE  TODAY'S TREATMENT: Therapeutic exercise: to centralize symptoms and improve ROM, strength, muscular endurance, and activity tolerance required for successful completion of functional activities.  - NuStep level 5 using lower extremities. Seat/handle setting 9. For improved extremity mobility, muscular endurance, and activity tolerance; and to induce the analgesic effect of aerobic exercise, stimulate improved joint nutrition, and prepare body structures and systems for following interventions. x 6 minutes. Average SPM = 84. -  sit <> stand from 17 inch chair, 4x6 - seated LAQ with 10#AW, 4x6 each side.    Side lying hip abduction:   - LLE: 2# AW's, 4x6  - RLE:3x6 (pain during, not after)  Pt required multimodal cuing for proper technique and to  facilitate improved neuromuscular control, strength, range of motion, and functional ability resulting in improved performance and form. Requires CGA - SBA for balance exercises.   PATIENT EDUCATION:  Education details: form/technique with exercise. Person educated: Patient Education method: Explanation, demonstration, verbal cuing, tactile cuing. Education comprehension: verbalized understanding, demonstrated understanding, and needs further education     HOME EXERCISE PROGRAM: Access Code: [email or web address] URL: [email or web address] Date: 10/04/2021 Prepared by:    Exercises - Sit to Stand  - 3-4 x weekly - 4 sets - 5 reps - Sidelying Hip Abduction  - 3-4 x weekly - 4 sets - 6 reps - 2 seconds hold    ASSESSMENT:   CLINICAL IMPRESSION: Patient arrives with elevated pain after another fall, again related to navigating the one step in her home. She appears to have improved her technique with navigating the step since then. She tolerated today's session well overall but continues to have LE weakness and imbalance that negatively affects gait, fall risk, and functional mobility. Patient would benefit from continued management of limiting   condition by skilled physical therapist to address remaining impairments and functional limitations to work towards stated goals and return to PLOF or maximal functional independence.  Patient is a 46 y.o. female referred to outpatient physical therapy with a medical diagnosis of bilateral leg weakness who presents with signs and symptoms consistent with generalized weakness, balance deficits, and motor control problems likely related to deconditioning and FSHD muscular dystrophy. Patient presents with significant balance, motor control, posture, postural, gait, pain, muscle performance (strength/power/endurance) and activity tolerance impairments that are limiting ability to complete basic mobility and self care including ambulation, stairs,  transfers, ADLs, and IADLs without difficulty. At eval requires assistance to navigate stairs, assistance with ADLs and  IADLs, and ambulates with RW when she previously did not need an assistive device. Patient would likely benefit from bilateral AFOs to address foot drop found at both ankles and improve her steppage gait. Patient will benefit from skilled physical therapy intervention to address current body structure impairments and activity limitations to improve function and work towards goals set in current POC in order to return to prior level of function or maximal functional improvement.   OBJECTIVE IMPAIRMENTS Abnormal gait, decreased activity tolerance, decreased balance, decreased coordination, decreased endurance, decreased mobility, difficulty walking, decreased ROM, decreased strength, impaired perceived functional ability, increased muscle spasms, impaired tone, impaired UE functional use, improper body mechanics, postural dysfunction, and pain.    ACTIVITY LIMITATIONS cleaning, community activity, driving, meal prep, laundry, and shopping.    PERSONAL FACTORS Behavior pattern, Fitness, Past/current experiences, Time since onset of injury/illness/exacerbation, Transportation, and 3+ comorbidities:    muscular dystrophy (FSHD - facioscapulohumeral muscular dystrophy), anxiety and depression, bipolar 1 disorder, borderline personality disorder, diabetes, GERD, gout, pancreatitis, alcoholic liver disease, tobacco abuse,  foot drop, neuropathy, recovering alcoholic, history of drug abuse, history of hernia repair are also affecting patient's functional outcome.      REHAB POTENTIAL: Good   CLINICAL DECISION MAKING: Evolving/moderate complexity   EVALUATION COMPLEXITY: Moderate     GOALS: Goals reviewed with patient? No   SHORT TERM GOALS: Target date: 07/24/2021   Patient will be independent with initial home exercise program for self-management of symptoms. Baseline: Initial HEP to  be provided at visit 2 as appropriate (07/10/21); Goal status: Met     LONG TERM GOALS: Target date: 10/02/2021; updated to 12/27/2021 for all unmet goals on 10/04/2021.    Patient will be independent with a long-term home exercise program for self-management of symptoms.  Baseline: Initial HEP to be provided at visit 2 as appropriate (07/10/21); participating as tolerated, recently too much pain to participate (09/04/2021); participating regularly (10/04/2021);  Goal status: In-Progress   2.  Patient will demonstrate improved FOTO score by 10 points from baseline to demonstrate improvement in overall condition and self-reported functional ability.  Baseline: to be measured visit 2 as appropriate (07/10/21); 46 at visit # 2 (07/17/2021); 50 at visit # 10 (09/04/2021); 53 at visit #15 (10/04/2021);;  Goal status: In-Progress   3.  Patient will ambulate equal or greater than 1000 feet during 6 Min Walk Test using SPC or less restrictive assistive device to improve community ambulation.  Baseline: To be tested visit 2 as appropriate (07/10/21); 310 feet with RW (07/17/2021); 556 feet with rollator (09/04/2021); 600 feet with rollator (10/04/2021);  Goal status: In-Progress   4.  Patient will complete 5 Time Sit To Stand test in equal or less than 20 seconds with no UE support from 18.5 inch plinth to demonstrate improved LE   functional strength and power for basic mobility and improved fall risk.  Baseline: 24 seconds with B UE on 18.5 inch plinth.  (07/10/21); 20 seconds with B UE on thighs from 18.5 inch plinth (09/04/2021); 19 seconds with B UE on thighs from 18.5 inch plinth (10/04/2021);  Goal status: In-Progress   5.  Patient be able to ascend and descend stairs at her home mod I with use of B handrails.  Baseline: able to ascend/descend 4 steps in the clinic with B UE support on handrails and supervision - reports currently needs assistance to descend steps at home. (07/10/21); able to ascend/descend 4  steps in the clinic with B UE support on handrails and supervision, leading up and down with left. able to perform with reversed LE leads (09/04/2021); able to ascend/descend 4 steps in the clinic with B UE support on handrails and supervision, step over step with heavy UE use on the way down (10/04/2021); Goal status: In-Progress   6.  Patient will demonstrate ability to perform static balance in tandem stance for equal or greater than 30 seconds on each side, to demonstrate improved balance and decreased fall risk.  Baseline: right foot front 10 seconds, left foot front 7 seconds (07/10/2021); right foot front 4 seconds, left foot front 6 seconds (09/04/2021); right foot front 9 seconds, left foot front 31 seconds (10/04/2021);  Goal status: In-Progress     PLAN: PT FREQUENCY: 2x/week   PT DURATION: 12 weeks   PLANNED INTERVENTIONS: Therapeutic exercises, Therapeutic activity, Neuromuscular re-education, Balance training, Gait training, Patient/Family education, Joint mobilization, Orthotic/Fit training, DME instructions, Dry Needling, Electrical stimulation, Cryotherapy, Moist heat, Splintting, Taping, and Manual therapy   PLAN FOR NEXT SESSION: continue with moderate intensity functional strengthening and balance exercises.     Everlean Alstrom. Graylon Good, PT, DPT 10/23/21, 1:56 PM  [facility] [address] [place], [place] [address] P: [phone] I F: [phone]

## 2021-10-25 ENCOUNTER — Ambulatory Visit: Payer: Medicare Other | Admitting: Physical Therapy

## 2021-10-31 ENCOUNTER — Ambulatory Visit: Payer: Medicare Other | Attending: Internal Medicine | Admitting: Physical Therapy

## 2021-10-31 ENCOUNTER — Encounter: Payer: Self-pay | Admitting: Physical Therapy

## 2021-10-31 DIAGNOSIS — M6281 Muscle weakness (generalized): Secondary | ICD-10-CM

## 2021-10-31 DIAGNOSIS — R2689 Other abnormalities of gait and mobility: Secondary | ICD-10-CM | POA: Diagnosis not present

## 2021-10-31 DIAGNOSIS — Z9181 History of falling: Secondary | ICD-10-CM

## 2021-10-31 NOTE — Therapy (Signed)
OUTPATIENT PHYSICAL THERAPY TREATMENT NOTE   Patient Name: [name] MRN: [id number] DOB:1975-05-15, 46 y.o., female Today's Date: 10/31/2021  PCP: Janine Limbo, PA-C REFERRING PROVIDER: Janine Limbo, PA-C  END OF SESSION:   PT End of Session - 10/31/21 1046     Visit Number 18    Number of Visits 24    Date for PT Re-Evaluation 12/27/21    Authorization Type [employer] MEDICARE/Medicaid reporting period from 09/04/2021    Progress Note Due on Visit 10    PT Start Time 1033    PT Stop Time 1111    PT Time Calculation (min) 38 min    Equipment Utilized During Treatment Gait belt    Activity Tolerance Patient limited by pain    Behavior During Therapy WFL for tasks assessed/performed             Past Medical History:  Diagnosis Date   Alcohol abuse    Anxiety    Anxiety    Anxiety and depression    Asthma    Bipolar disorder ([place])    Borderline personality disorder ([place])    Depression    Diabetes ([place])    Type 2    Dyslipidemia    Esophageal varices ([name])    Foot drop    right   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[place])    GERD (gastroesophageal reflux disease)    Gout    History of drug abuse ([place])    Insomnia    Muscular dystrophies ([place])    Neuropathy    Pancreatitis    Recovering alcoholic ([place])    Past Surgical History:  Procedure Laterality Date   HERNIA REPAIR     IR FLUORO GUIDE CV LINE RIGHT  12/30/2020   IR REMOVAL TUN CV CATH W/O FL  01/19/2021   IR US GUIDE VASC ACCESS RIGHT  12/30/2020   Patient Active Problem List   Diagnosis Date Noted   Frequent falls 02/12/2021   Leukocytosis    Aspiration pneumonia of both lower lobes due to gastric secretions ([place]) 01/04/2021   Pressure injury of skin 12/24/2020   Acute hepatic encephalopathy ([place]) 12/20/2020   Sepsis ([place]) 12/19/2020   Acute hyponatremia 12/18/2020   Alcohol use disorder, moderate, dependence ([place]) 12/18/2020   Hepatic steatosis 12/18/2020   Polysubstance abuse ([place])  12/18/2020   Hyperglycemia due to type 2 diabetes mellitus ([place]) 12/18/2020   Hyperbilirubinemia 12/18/2020   Lactic acidosis 05/39/7673   Alcoholic liver disease ([place]) 12/18/2020   Bipolar 1 disorder, manic, moderate ([place]) 09/10/2018   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   Diabetes ([place]) 09/10/2018   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[place]) 03/28/2018   Chronic gout of foot 09/29/2015   Muscular dystrophy ([place]) 09/29/2015   Type 2 diabetes mellitus without complication ([place]) 41/93/7902   Anxiety and depression 10/01/2012   Protein-calorie malnutrition, severe ([place]) 09/30/2012   Alcohol abuse 09/26/2012   Trichomonas infection 09/26/2012   Dehydration 09/26/2012   Hypokalemia 11/14/2011   Thrombocytopenia ([place]) 11/14/2011   Alcohol withdrawal ([place]) 11/13/2011   Bipolar 2 disorder ([place]) 11/13/2011   Tobacco abuse 11/13/2011   Cocaine abuse ([place]) 11/13/2011   Gout 11/13/2011     REFERRING DIAG: bilateral leg weakness  THERAPY DIAG:  Other abnormalities of gait and mobility  Muscle weakness (generalized)  History of falling  Rationale for Evaluation and Treatment Rehabilitation  PERTINENT HISTORY: Patient is a 46 y.o. female who presents to outpatient physical therapy with a referral for medical diagnosis bilateral leg weakness. This  patient's chief complaints consist of difficulty with weakness and balance with history of frequent falling leading to the following functional deficits: difficulty with basic mobility including ambulation, stairs, transfers; difficulty with ADLs, IADLs. Currently requires assistance to navigate stairs, assistance with ADLs and  IADLs, and ambulates with RW when she previously did not need an assistive device. Relevant past medical history and comorbidities include muscular dystrophy (FSHD - facioscapulohumeral muscular dystrophy), anxiety and depression, bipolar 1 disorder, borderline personality disorder, diabetes, GERD, gout, pancreatitis,  alcoholic liver disease, tobacco abuse,  foot drop, neuropathy, recovering alcoholic, history of drug abuse, history of hernia repair.  Patient denies hx of cancer, stroke, seizures, lung problems, heart problems, unexplained weight loss, unexplained changes in bowel or bladder problems, unexplained stumbling or dropping things, osteoporosis, and spinal surgery  PRECAUTIONS: Fall, not drink alcohol.  SUBJECTIVE: Patient arrives on rollator. She states her right knee keeps wanting to do "something funny" and "turn in." She has not fallen since last PT session. She has been sore since last PT session. She called her doctor about her pain and they said they cannot do anything without seeing her.  She states her legs are trying to cross when ambulating. She has not been doing as good with her HEP since her last fall. She states her lower back is hurting a lot and she sometimes has a hard time just getting her ADLs done.   PAIN:   3/10 NRPS at low back, R SIJ  OBJECTIVE  TODAY'S TREATMENT: Therapeutic exercise: to centralize symptoms and improve ROM, strength, muscular endurance, and activity tolerance required for successful completion of functional activities.  - NuStep level 5 using lower extremities. Seat/handle setting 9. For improved extremity mobility, muscular endurance, and activity tolerance; and to induce the analgesic effect of aerobic exercise, stimulate improved joint nutrition, and prepare body structures and systems for following interventions. x 6 minutes. Average SPM = 81.  - Hooklying LTR, 3 min, self selected speed to decrease low back pain.  - hooklying posterior pelvic tilt, 1x20 with 5 second holds.  - hooklying modified single leg stretch, 3x6 on each side. VC/TC to improve  form and abdominal contraction.  Torso down, arms holding knees with abdominal muscles also engaged.  - hooklying articulated bridge, 3x6. VC/TC for form (feels in back, unable to lift hips off mat).   -  Side lying hip abduction: LLE: 2# AW's, 4x6; not tolerated on R LE.  - sidelying  RLE clam shell 4x6   Pt required multimodal cuing for proper technique and to facilitate improved neuromuscular control, strength, range of motion, and functional ability resulting in improved performance and form. Requires CGA - SBA for balance exercises.   PATIENT EDUCATION:  Education details: form/technique with exercise. Person educated: Patient Education method: Explanation, demonstration, verbal cuing, tactile cuing. Education comprehension: verbalized understanding, demonstrated understanding, and needs further education     HOME EXERCISE PROGRAM: Access Code: [email or web address] URL: [email or web address] Date: 10/04/2021 Prepared by: Rosita Kea  Exercises - Sit to Stand  - 3-4 x weekly - 4 sets - 5 reps - Sidelying Hip Abduction  - 3-4 x weekly - 4 sets - 6 reps - 2 seconds hold    ASSESSMENT:   CLINICAL IMPRESSION: Patient arrives with decreased pain compared to last PT session but is continuing to struggle with back pain and right hip pain as well as feeling less control of R knee since last fall. Today's session focused on core and LE strengthening as tolerated and with  short sets to help prevent over-fatiguing muscles. Patient encouraged to return to HEP as able that could help her feel stronger. Also agree that patient would benefit from consulting her doctor about her R knee pain/dysfunction. Patient would benefit from continued management of limiting condition by skilled physical therapist to address remaining impairments and functional limitations to work towards stated goals and return to PLOF or maximal functional independence.   Patient is a 46 y.o. female referred to outpatient physical therapy with a medical diagnosis of bilateral leg weakness who presents with signs and symptoms consistent with generalized weakness, balance deficits, and motor control problems likely related to  deconditioning and FSHD muscular dystrophy. Patient presents with significant balance, motor control, posture, postural, gait, pain, muscle performance (strength/power/endurance) and activity tolerance impairments that are limiting ability to complete basic mobility and self care including ambulation, stairs, transfers, ADLs, and IADLs without difficulty. At eval requires assistance to navigate stairs, assistance with ADLs and  IADLs, and ambulates with RW when she previously did not need an assistive device. Patient would likely benefit from bilateral AFOs to address foot drop found at both ankles and improve her steppage gait. Patient will benefit from skilled physical therapy intervention to address current body structure impairments and activity limitations to improve function and work towards goals set in current POC in order to return to prior level of function or maximal functional improvement.   OBJECTIVE IMPAIRMENTS Abnormal gait, decreased activity tolerance, decreased balance, decreased coordination, decreased endurance, decreased mobility, difficulty walking, decreased ROM, decreased strength, impaired perceived functional ability, increased muscle spasms, impaired tone, impaired UE functional use, improper body mechanics, postural dysfunction, and pain.    ACTIVITY LIMITATIONS cleaning, community activity, driving, meal prep, laundry, and shopping.    PERSONAL FACTORS Behavior pattern, Fitness, Past/current experiences, Time since onset of injury/illness/exacerbation, Transportation, and 3+ comorbidities:    muscular dystrophy (FSHD - facioscapulohumeral muscular dystrophy), anxiety and depression, bipolar 1 disorder, borderline personality disorder, diabetes, GERD, gout, pancreatitis, alcoholic liver disease, tobacco abuse,  foot drop, neuropathy, recovering alcoholic, history of drug abuse, history of hernia repair are also affecting patient's functional outcome.      REHAB POTENTIAL: Good    CLINICAL DECISION MAKING: Evolving/moderate complexity   EVALUATION COMPLEXITY: Moderate     GOALS: Goals reviewed with patient? No   SHORT TERM GOALS: Target date: 07/24/2021   Patient will be independent with initial home exercise program for self-management of symptoms. Baseline: Initial HEP to be provided at visit 2 as appropriate (07/10/21); Goal status: Met     LONG TERM GOALS: Target date: 10/02/2021; updated to 12/27/2021 for all unmet goals on 10/04/2021.    Patient will be independent with a long-term home exercise program for self-management of symptoms.  Baseline: Initial HEP to be provided at visit 2 as appropriate (07/10/21); participating as tolerated, recently too much pain to participate (09/04/2021); participating regularly (10/04/2021);  Goal status: In-Progress   2.  Patient will demonstrate improved FOTO score by 10 points from baseline to demonstrate improvement in overall condition and self-reported functional ability.  Baseline: to be measured visit 2 as appropriate (07/10/21); 46 at visit # 2 (07/17/2021); 50 at visit # 10 (09/04/2021); 53 at visit #15 (10/04/2021);;  Goal status: In-Progress   3.  Patient will ambulate equal or greater than 1000 feet during 6 Min Walk Test using [facility] or less restrictive assistive device to improve community ambulation.  Baseline: To be tested visit 2 as appropriate (07/10/21); 310 feet with RW (07/17/2021); 556  feet with rollator (09/04/2021); 600 feet with rollator (10/04/2021);  Goal status: In-Progress   4.  Patient will complete 5 Time Sit To Stand test in equal or less than 20 seconds with no UE support from 18.5 inch plinth to demonstrate improved LE functional strength and power for basic mobility and improved fall risk.  Baseline: 24 seconds with B UE on 18.5 inch plinth.  (07/10/21); 20 seconds with B UE on thighs from 18.5 inch plinth (09/04/2021); 19 seconds with B UE on thighs from 18.5 inch plinth (10/04/2021);  Goal status:  In-Progress   5.  Patient be able to ascend and descend stairs at her home mod I with use of B handrails.  Baseline: able to ascend/descend 4 steps in the clinic with B UE support on handrails and supervision - reports currently needs assistance to descend steps at home. (07/10/21); able to ascend/descend 4 steps in the clinic with B UE support on handrails and supervision, leading up and down with left. able to perform with reversed LE leads (09/04/2021); able to ascend/descend 4 steps in the clinic with B UE support on handrails and supervision, step over step with heavy UE use on the way down (10/04/2021); Goal status: In-Progress   6.  Patient will demonstrate ability to perform static balance in tandem stance for equal or greater than 30 seconds on each side, to demonstrate improved balance and decreased fall risk.  Baseline: right foot front 10 seconds, left foot front 7 seconds (07/10/2021); right foot front 4 seconds, left foot front 6 seconds (09/04/2021); right foot front 9 seconds, left foot front 31 seconds (10/04/2021);  Goal status: In-Progress     PLAN: PT FREQUENCY: 2x/week   PT DURATION: 12 weeks   PLANNED INTERVENTIONS: Therapeutic exercises, Therapeutic activity, Neuromuscular re-education, Balance training, Gait training, Patient/Family education, Joint mobilization, Orthotic/Fit training, DME instructions, Dry Needling, Electrical stimulation, Cryotherapy, Moist heat, Splintting, Taping, and Manual therapy   PLAN FOR NEXT SESSION: continue with moderate intensity functional strengthening and balance exercises.     Everlean Alstrom. Graylon Good, PT, DPT 10/31/21, 11:17 AM  [facility] [address] [place], [place] [address] P: [phone] I F: [phone]

## 2021-11-06 ENCOUNTER — Ambulatory Visit: Payer: Medicare Other | Admitting: Physical Therapy

## 2021-11-07 ENCOUNTER — Ambulatory Visit: Payer: Medicare Other | Admitting: Physical Therapy

## 2021-11-09 ENCOUNTER — Ambulatory Visit: Payer: Medicare Other | Admitting: Physical Therapy

## 2021-11-14 ENCOUNTER — Encounter: Payer: Self-pay | Admitting: Physical Therapy

## 2021-11-14 ENCOUNTER — Ambulatory Visit: Payer: Medicare Other | Admitting: Physical Therapy

## 2021-11-14 DIAGNOSIS — Z9181 History of falling: Secondary | ICD-10-CM | POA: Diagnosis not present

## 2021-11-14 DIAGNOSIS — R2689 Other abnormalities of gait and mobility: Secondary | ICD-10-CM | POA: Diagnosis not present

## 2021-11-14 DIAGNOSIS — M6281 Muscle weakness (generalized): Secondary | ICD-10-CM | POA: Diagnosis not present

## 2021-11-14 NOTE — Therapy (Signed)
OUTPATIENT PHYSICAL THERAPY TREATMENT NOTE   Patient Name: [name] MRN: [id number] DOB:Sep 10, 1975, 46 y.o., female Today's Date: 11/14/2021  PCP: Janine Limbo, PA-C REFERRING PROVIDER: Janine Limbo, PA-C  END OF SESSION:   PT End of Session - 11/14/21 1445     Visit Number 19    Number of Visits 24    Date for PT Re-Evaluation 12/27/21    Authorization Type [employer] MEDICARE/Medicaid reporting period from 09/04/2021    Progress Note Due on Visit 10    PT Start Time 1437    PT Stop Time 1515    PT Time Calculation (min) 38 min    Equipment Utilized During Treatment Gait belt    Activity Tolerance Patient limited by pain    Behavior During Therapy WFL for tasks assessed/performed              Past Medical History:  Diagnosis Date   Alcohol abuse    Anxiety    Anxiety    Anxiety and depression    Asthma    Bipolar disorder ([place])    Borderline personality disorder ([place])    Depression    Diabetes ([place])    Type 2    Dyslipidemia    Esophageal varices ([name])    Foot drop    right   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[place])    GERD (gastroesophageal reflux disease)    Gout    History of drug abuse ([place])    Insomnia    Muscular dystrophies ([place])    Neuropathy    Pancreatitis    Recovering alcoholic ([place])    Past Surgical History:  Procedure Laterality Date   HERNIA REPAIR     IR FLUORO GUIDE CV LINE RIGHT  12/30/2020   IR REMOVAL TUN CV CATH W/O FL  01/19/2021   IR US GUIDE VASC ACCESS RIGHT  12/30/2020   Patient Active Problem List   Diagnosis Date Noted   Frequent falls 02/12/2021   Leukocytosis    Aspiration pneumonia of both lower lobes due to gastric secretions ([place]) 01/04/2021   Pressure injury of skin 12/24/2020   Acute hepatic encephalopathy ([place]) 12/20/2020   Sepsis ([place]) 12/19/2020   Acute hyponatremia 12/18/2020   Alcohol use disorder, moderate, dependence ([place]) 12/18/2020   Hepatic steatosis 12/18/2020   Polysubstance abuse ([place])  12/18/2020   Hyperglycemia due to type 2 diabetes mellitus ([place]) 12/18/2020   Hyperbilirubinemia 12/18/2020   Lactic acidosis 04/29/1550   Alcoholic liver disease ([place]) 12/18/2020   Bipolar 1 disorder, manic, moderate ([place]) 09/10/2018   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/10/2018   Diabetes ([place]) 09/10/2018   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[place]) 03/28/2018   Chronic gout of foot 09/29/2015   Muscular dystrophy ([place]) 09/29/2015   Type 2 diabetes mellitus without complication ([place]) 10/19/2334   Anxiety and depression 10/01/2012   Protein-calorie malnutrition, severe ([place]) 09/30/2012   Alcohol abuse 09/26/2012   Trichomonas infection 09/26/2012   Dehydration 09/26/2012   Hypokalemia 11/14/2011   Thrombocytopenia ([place]) 11/14/2011   Alcohol withdrawal ([place]) 11/13/2011   Bipolar 2 disorder ([place]) 11/13/2011   Tobacco abuse 11/13/2011   Cocaine abuse ([place]) 11/13/2011   Gout 11/13/2011     REFERRING DIAG: bilateral leg weakness  THERAPY DIAG:  Other abnormalities of gait and mobility  Muscle weakness (generalized)  History of falling  Rationale for Evaluation and Treatment Rehabilitation  PERTINENT HISTORY: Patient is a 46 y.o. female who presents to outpatient physical therapy with a referral for medical diagnosis bilateral leg weakness.  This patient's chief complaints consist of difficulty with weakness and balance with history of frequent falling leading to the following functional deficits: difficulty with basic mobility including ambulation, stairs, transfers; difficulty with ADLs, IADLs. Currently requires assistance to navigate stairs, assistance with ADLs and  IADLs, and ambulates with RW when she previously did not need an assistive device. Relevant past medical history and comorbidities include muscular dystrophy (FSHD - facioscapulohumeral muscular dystrophy), anxiety and depression, bipolar 1 disorder, borderline personality disorder, diabetes, GERD, gout, pancreatitis,  alcoholic liver disease, tobacco abuse,  foot drop, neuropathy, recovering alcoholic, history of drug abuse, history of hernia repair.  Patient denies hx of cancer, stroke, seizures, lung problems, heart problems, unexplained weight loss, unexplained changes in bowel or bladder problems, unexplained stumbling or dropping things, osteoporosis, and spinal surgery  PRECAUTIONS: Fall, not drink alcohol.  SUBJECTIVE: Patient arrives on rollator. She states she saw her chiropractor recently and he worked on her right hip which moved her pain more laterally. She states the pain seems just as bad but different. She states her knee continues to feel like it is going to turn in when she is going to get up and when she extends her knee in standing it hurts on the lateral thigh and calf. She has not had any falls since last PT session. She states she is pretty okay today but she does sit with ice on her right glute unless she is sitting leaning forward at the kitchen table and she does not need it. R knee just hurts as she goes from sit to stand so she stopped that exercises as well as the mat exercises because that also hurts too much. She states she felt "thoroughly worked" and her pain did not seem to be any worse than it normally would have been after last PT session. She does feel her R knee is getting a little better. The last little motion  of pulling her hips and back up the last little way. She has noticed she cannot pick something up from the top because her hand will not hold it.   PAIN:   Not when sitting  OBJECTIVE  TODAY'S TREATMENT: Therapeutic exercise: to centralize symptoms and improve ROM, strength, muscular endurance, and activity tolerance required for successful completion of functional activities.  - NuStep level 5 using lower extremities. Seat/handle setting 9. For improved extremity mobility, muscular endurance, and activity tolerance; and to induce the analgesic effect of aerobic exercise,  stimulate improved joint nutrition, and prepare body structures and systems for following interventions. x 6 minutes. Average SPM = 81. - up and down 4 stairs step over step with B UE support, 2x up and down, 6 inch steps. Reports she feels unsteady with a little pain in her R knee when leading down with left LE>  - Hooklying LTR, 3 min, self selected speed to decrease low back pain.  - hooklying modified single leg stretch, 3x6 on each side. VC/TC to improve  form and abdominal contraction.  Torso down, arms holding knees with abdominal muscles also engaged.   - Side lying hip abduction: LLE: 2# AW's, 4x6; not tolerated on R LE.  - sidelying  RLE clam shell 4x6  - hooklying articulated bridge, 3x6. VC/TC for form (feels in right back and glute, unable to lift hips off mat). 5 second holds.   Pt required multimodal cuing for proper technique and to facilitate improved neuromuscular control, strength, range of motion, and functional ability resulting in improved performance and form.  Requires CGA - SBA for balance exercises.   PATIENT EDUCATION:  Education details: form/technique with exercise. Person educated: Patient Education method: Explanation, demonstration, verbal cuing, tactile cuing. Education comprehension: verbalized understanding, demonstrated understanding, and needs further education     HOME EXERCISE PROGRAM: Access Code: [email or web address] URL: [email or web address] Date: 11/14/2021 Prepared by: Rosita Kea  Exercises - Supine Lower Trunk Rotation  - 2 x daily - 1 sets - 20 reps - 1-5 seconds hold - Single Leg Stretch  - 3-4 x weekly - 3 sets - 6 reps - Supine Bridge with Spinal Articulation  - 3-4 x weekly - 3 sets - 6 reps - Clam Shells in Side Lying  - 3-4 x weekly - 3 sets - 10 reps - Sidelying Hip Abduction  - 3-4 x weekly - 4 sets - 6 reps - 2 seconds hold    ASSESSMENT:   CLINICAL IMPRESSION: Patient tolerated treatment well overall with usual fatigue and  need for rest breaks. Also continued to have discomfort in right glute region that prevented her from doing sit<> stand exercise or R sided hip abduction. Patient appears to be feeling better with time in the right knee. Patient would benefit from continued management of limiting condition by skilled physical therapist to address remaining impairments and functional limitations to work towards stated goals and return to PLOF or maximal functional independence.   Patient is a 46 y.o. female referred to outpatient physical therapy with a medical diagnosis of bilateral leg weakness who presents with signs and symptoms consistent with generalized weakness, balance deficits, and motor control problems likely related to deconditioning and FSHD muscular dystrophy. Patient presents with significant balance, motor control, posture, postural, gait, pain, muscle performance (strength/power/endurance) and activity tolerance impairments that are limiting ability to complete basic mobility and self care including ambulation, stairs, transfers, ADLs, and IADLs without difficulty. At eval requires assistance to navigate stairs, assistance with ADLs and  IADLs, and ambulates with RW when she previously did not need an assistive device. Patient would likely benefit from bilateral AFOs to address foot drop found at both ankles and improve her steppage gait. Patient will benefit from skilled physical therapy intervention to address current body structure impairments and activity limitations to improve function and work towards goals set in current POC in order to return to prior level of function or maximal functional improvement.   OBJECTIVE IMPAIRMENTS Abnormal gait, decreased activity tolerance, decreased balance, decreased coordination, decreased endurance, decreased mobility, difficulty walking, decreased ROM, decreased strength, impaired perceived functional ability, increased muscle spasms, impaired tone, impaired UE  functional use, improper body mechanics, postural dysfunction, and pain.    ACTIVITY LIMITATIONS cleaning, community activity, driving, meal prep, laundry, and shopping.    PERSONAL FACTORS Behavior pattern, Fitness, Past/current experiences, Time since onset of injury/illness/exacerbation, Transportation, and 3+ comorbidities:    muscular dystrophy (FSHD - facioscapulohumeral muscular dystrophy), anxiety and depression, bipolar 1 disorder, borderline personality disorder, diabetes, GERD, gout, pancreatitis, alcoholic liver disease, tobacco abuse,  foot drop, neuropathy, recovering alcoholic, history of drug abuse, history of hernia repair are also affecting patient's functional outcome.      REHAB POTENTIAL: Good   CLINICAL DECISION MAKING: Evolving/moderate complexity   EVALUATION COMPLEXITY: Moderate     GOALS: Goals reviewed with patient? No   SHORT TERM GOALS: Target date: 07/24/2021   Patient will be independent with initial home exercise program for self-management of symptoms. Baseline: Initial HEP to be provided at visit 2 as appropriate (07/10/21); Goal status: Met  LONG TERM GOALS: Target date: 10/02/2021; updated to 12/27/2021 for all unmet goals on 10/04/2021.    Patient will be independent with a long-term home exercise program for self-management of symptoms.  Baseline: Initial HEP to be provided at visit 2 as appropriate (07/10/21); participating as tolerated, recently too much pain to participate (09/04/2021); participating regularly (10/04/2021);  Goal status: In-Progress   2.  Patient will demonstrate improved FOTO score by 10 points from baseline to demonstrate improvement in overall condition and self-reported functional ability.  Baseline: to be measured visit 2 as appropriate (07/10/21); 46 at visit # 2 (07/17/2021); 50 at visit # 10 (09/04/2021); 53 at visit #15 (10/04/2021);;  Goal status: In-Progress   3.  Patient will ambulate equal or greater than 1000 feet  during 6 Min Walk Test using [facility] or less restrictive assistive device to improve community ambulation.  Baseline: To be tested visit 2 as appropriate (07/10/21); 310 feet with RW (07/17/2021); 556 feet with rollator (09/04/2021); 600 feet with rollator (10/04/2021);  Goal status: In-Progress   4.  Patient will complete 5 Time Sit To Stand test in equal or less than 20 seconds with no UE support from 18.5 inch plinth to demonstrate improved LE functional strength and power for basic mobility and improved fall risk.  Baseline: 24 seconds with B UE on 18.5 inch plinth.  (07/10/21); 20 seconds with B UE on thighs from 18.5 inch plinth (09/04/2021); 19 seconds with B UE on thighs from 18.5 inch plinth (10/04/2021);  Goal status: In-Progress   5.  Patient be able to ascend and descend stairs at her home mod I with use of B handrails.  Baseline: able to ascend/descend 4 steps in the clinic with B UE support on handrails and supervision - reports currently needs assistance to descend steps at home. (07/10/21); able to ascend/descend 4 steps in the clinic with B UE support on handrails and supervision, leading up and down with left. able to perform with reversed LE leads (09/04/2021); able to ascend/descend 4 steps in the clinic with B UE support on handrails and supervision, step over step with heavy UE use on the way down (10/04/2021); Goal status: In-Progress   6.  Patient will demonstrate ability to perform static balance in tandem stance for equal or greater than 30 seconds on each side, to demonstrate improved balance and decreased fall risk.  Baseline: right foot front 10 seconds, left foot front 7 seconds (07/10/2021); right foot front 4 seconds, left foot front 6 seconds (09/04/2021); right foot front 9 seconds, left foot front 31 seconds (10/04/2021);  Goal status: In-Progress     PLAN: PT FREQUENCY: 2x/week   PT DURATION: 12 weeks   PLANNED INTERVENTIONS: Therapeutic exercises, Therapeutic activity,  Neuromuscular re-education, Balance training, Gait training, Patient/Family education, Joint mobilization, Orthotic/Fit training, DME instructions, Dry Needling, Electrical stimulation, Cryotherapy, Moist heat, Splintting, Taping, and Manual therapy   PLAN FOR NEXT SESSION: continue with moderate intensity functional strengthening and balance exercises.     Everlean Alstrom. Graylon Good, PT, DPT 11/14/21, 3:48 PM  [facility] [address] [place], [place] [address] P: [phone] I F: [phone]

## 2021-11-15 DIAGNOSIS — Z0000 Encounter for general adult medical examination without abnormal findings: Secondary | ICD-10-CM | POA: Diagnosis not present

## 2021-11-15 DIAGNOSIS — Z9181 History of falling: Secondary | ICD-10-CM | POA: Diagnosis not present

## 2021-11-15 DIAGNOSIS — E785 Hyperlipidemia, unspecified: Secondary | ICD-10-CM | POA: Diagnosis not present

## 2021-11-15 DIAGNOSIS — R531 Weakness: Secondary | ICD-10-CM | POA: Diagnosis not present

## 2021-11-21 ENCOUNTER — Ambulatory Visit: Payer: Medicare Other | Attending: Internal Medicine | Admitting: Physical Therapy

## 2021-11-21 ENCOUNTER — Encounter: Payer: Self-pay | Admitting: Physical Therapy

## 2021-11-21 DIAGNOSIS — Z9181 History of falling: Secondary | ICD-10-CM | POA: Insufficient documentation

## 2021-11-21 DIAGNOSIS — R2689 Other abnormalities of gait and mobility: Secondary | ICD-10-CM | POA: Diagnosis not present

## 2021-11-21 DIAGNOSIS — M6281 Muscle weakness (generalized): Secondary | ICD-10-CM | POA: Insufficient documentation

## 2021-11-21 NOTE — Therapy (Addendum)
OUTPATIENT PHYSICAL THERAPY TREATMENT NOTE / PROGRESS NOTE  Dates of reporting 09/04/2021 to 11/21/2021  Patient Name: [name] MRN: [id number] DOB:January 05, 1976, 46 y.o., female Today's Date: 11/21/2021  PCP: Janine Limbo, PA-C REFERRING PROVIDER: Janine Limbo, PA-C  END OF SESSION:   PT End of Session - 11/21/21 1609     Visit Number 20    Number of Visits 24    Date for PT Re-Evaluation 12/27/21    Authorization Type [employer] MEDICARE/Medicaid reporting period from 09/04/2021    Progress Note Due on Visit 10    PT Start Time 1608    PT Stop Time 1646    PT Time Calculation (min) 38 min    Equipment Utilized During Treatment Gait belt    Activity Tolerance Patient limited by pain    Behavior During Therapy WFL for tasks assessed/performed               Past Medical History:  Diagnosis Date   Alcohol abuse    Anxiety    Anxiety    Anxiety and depression    Asthma    Bipolar disorder ([place])    Borderline personality disorder ([place])    Depression    Diabetes ([place])    Type 2    Dyslipidemia    Esophageal varices ([name])    Foot drop    right   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[place])    GERD (gastroesophageal reflux disease)    Gout    History of drug abuse ([place])    Insomnia    Muscular dystrophies ([place])    Neuropathy    Pancreatitis    Recovering alcoholic ([place])    Past Surgical History:  Procedure Laterality Date   HERNIA REPAIR     IR FLUORO GUIDE CV LINE RIGHT  12/30/2020   IR REMOVAL TUN CV CATH W/O FL  01/19/2021   IR US GUIDE VASC ACCESS RIGHT  12/30/2020   Patient Active Problem List   Diagnosis Date Noted   Frequent falls 02/12/2021   Leukocytosis    Aspiration pneumonia of both lower lobes due to gastric secretions ([place]) 01/04/2021   Pressure injury of skin 12/24/2020   Acute hepatic encephalopathy ([place]) 12/20/2020   Sepsis ([place]) 12/19/2020   Acute hyponatremia 12/18/2020   Alcohol use disorder, moderate, dependence ([place]) 12/18/2020    Hepatic steatosis 12/18/2020   Polysubstance abuse ([place]) 12/18/2020   Hyperglycemia due to type 2 diabetes mellitus ([place]) 12/18/2020   Hyperbilirubinemia 12/18/2020   Lactic acidosis 42/35/3614   Alcoholic liver disease ([place]) 12/18/2020   Bipolar 1 disorder, manic, moderate ([place]) 09/10/2018   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   Diabetes ([place]) 09/10/2018   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[place]) 03/28/2018   Chronic gout of foot 09/29/2015   Muscular dystrophy ([place]) 09/29/2015   Type 2 diabetes mellitus without complication ([place]) 43/15/4008   Anxiety and depression 10/01/2012   Protein-calorie malnutrition, severe ([place]) 09/30/2012   Alcohol abuse 09/26/2012   Trichomonas infection 09/26/2012   Dehydration 09/26/2012   Hypokalemia 11/14/2011   Thrombocytopenia ([place]) 11/14/2011   Alcohol withdrawal ([place]) 11/13/2011   Bipolar 2 disorder ([place]) 11/13/2011   Tobacco abuse 11/13/2011   Cocaine abuse ([place]) 11/13/2011   Gout 11/13/2011     REFERRING DIAG: bilateral leg weakness  THERAPY DIAG:  Other abnormalities of gait and mobility  Muscle weakness (generalized)  History of falling  Rationale for Evaluation and Treatment Rehabilitation  PERTINENT HISTORY: Patient is a 46 y.o. female who presents to outpatient physical  therapy with a referral for medical diagnosis bilateral leg weakness. This patient's chief complaints consist of difficulty with weakness and balance with history of frequent falling leading to the following functional deficits: difficulty with basic mobility including ambulation, stairs, transfers; difficulty with ADLs, IADLs. Currently requires assistance to navigate stairs, assistance with ADLs and  IADLs, and ambulates with RW when she previously did not need an assistive device. Relevant past medical history and comorbidities include muscular dystrophy (FSHD - facioscapulohumeral muscular dystrophy), anxiety and depression, bipolar 1 disorder, borderline  personality disorder, diabetes, GERD, gout, pancreatitis, alcoholic liver disease, tobacco abuse,  foot drop, neuropathy, recovering alcoholic, history of drug abuse, history of hernia repair.  Patient denies hx of cancer, stroke, seizures, lung problems, heart problems, unexplained weight loss, unexplained changes in bowel or bladder problems, unexplained stumbling or dropping things, osteoporosis, and spinal surgery  PRECAUTIONS: Fall, not drink alcohol.  SUBJECTIVE: Patient arrives on rollator. She states her right hip and low back continues to bother her and she has to sit on ice almost all of the time She states she is okay for about 30 min when she is not on the ice. She feels like her balance is better but not where she wants it to be. She is able to go down the stairs leading with L LE sometimes. She has stopped using her RW at home except when she goes down the step. Her right knee is still bothering her. She feels like PT is helping her. She felt "whooped" after last PT session. She feels like the knee thing has really messed her up. She has an appointment with her doctor about her knee at the end of the month. She thinks it is getting better because it doesn't hurt as bad when standing up. She feels like she has backslid lately due to the pain in her right LE and from previous falls, but she feels overall PT has helped a lot. She did lose her footing the other day and sat down hard on the mattress. She notes she has gained a lot of weight quickly and is preparing to start a diet monitored by her doctor that she has been successful with in the past.   PAIN: Yes 5/10 in R knee and back and right hip.   OBJECTIVE   SELF-REPORTED FUNCTION FOTO score: 50/100 (NOC-Neuromuscular disorder questionnaire)   FUNCTIONAL/BALANCE TESTS: 6 Minute Walk Test: 300 feet with rollator and steppage gait, R > L. Stopped due to increasing R glute/posterior hip pain at about 3 min. R knee pain no different after  walk.    Five Time Sit to Stand (5TSTS): 22 seconds with B UE on thighs from 18.5 inch plinth.    Static balance (best of 3 trials): tandem stance, eyes open: right foot front 6.5 seconds, left foot front 8.5 seconds.    Stairs: able to ascend/descend 4 steps in the clinic with B UE support on handrails and supervision, step over step up and step to leading with R LE with heavy UE use on the way down.     TODAY'S TREATMENT: Therapeutic exercise: to centralize symptoms and improve ROM, strength, muscular endurance, and activity tolerance required for successful completion of functional activities.  - measurements to assess progress (see above).  - prone press up 4x5 (feels like a good stretch, discomfort small of back during each rep).   Manual therapy: to reduce pain and tissue tension, improve range of motion, neuromodulation, in order to promote improved ability to complete  functional activities. PRONE - STM to B posterior lumbar paraspinals and right glute.  - CPA grade III-IV lumbar segments focusing on tender segments.   Pt required multimodal cuing for proper technique and to facilitate improved neuromuscular control, strength, range of motion, and functional ability resulting in improved performance and form. Requires CGA - SBA for balance exercises.   PATIENT EDUCATION:  Education details: form/technique with exercise. Person educated: Patient Education method: Explanation, demonstration, verbal cuing, tactile cuing. Education comprehension: verbalized understanding, demonstrated understanding, and needs further education     HOME EXERCISE PROGRAM: Access Code: [email or web address] URL: [email or web address] Date: 11/21/2021 Prepared by: Rosita Kea  Exercises - Supine Lower Trunk Rotation  - 2 x daily - 1 sets - 20 reps - 1-5 seconds hold - Single Leg Stretch  - 3-4 x weekly - 3 sets - 6 reps - Supine Bridge with Spinal Articulation  - 3-4 x weekly - 3 sets - 6 reps -  Clam Shells in Side Lying  - 3-4 x weekly - 3 sets - 10 reps - Sidelying Hip Abduction  - 3-4 x weekly - 4 sets - 6 reps - 2 seconds hold - Prone Press Up  - 2 x daily - 2 sets - 5 reps    ASSESSMENT:   CLINICAL IMPRESSION: Patient has attended 20 physical therapy sessions since starting current episode of care on 07/10/2021. Her visits have been interrupted at times by illness, transportation issues, pain after falling, and other medical needs or needs of her family. Patient has communicated well when unable to come and gives good effort during sessions. Patient was making progress towards her goals until one of her last falls caused increased back and right glute pain as well as right knee pain that has made it difficult to participate in PT at the same level as prior to the fall. Patient's progress has therefore regressed some and she is still currently limited by her right sided back and glute pain and knee pain. Knee pain does not appear directly related to back and glute pain at this time, but glute and back pain do appear to be related with glute pain likely referring from low back. Patient would benefit from PT interventions to address back/glute pain so she can continue working improved balance and functional mobility. Plan to continue PT with attention given to low back and R knee as appropriate. Patient with trial of lumbar extension based exercises and manual therapy today with initial mild improvement in discomfort. Patient would benefit from continued management of limiting condition by skilled physical therapist to address remaining impairments and functional limitations to work towards stated goals and return to PLOF or maximal functional independence.    From PT Eval 07/10/2021:  Patient is a 46 y.o. female referred to outpatient physical therapy with a medical diagnosis of bilateral leg weakness who presents with signs and symptoms consistent with generalized weakness, balance deficits,  and motor control problems likely related to deconditioning and FSHD muscular dystrophy. Patient presents with significant balance, motor control, posture, postural, gait, pain, muscle performance (strength/power/endurance) and activity tolerance impairments that are limiting ability to complete basic mobility and self care including ambulation, stairs, transfers, ADLs, and IADLs without difficulty. At eval requires assistance to navigate stairs, assistance with ADLs and  IADLs, and ambulates with RW when she previously did not need an assistive device. Patient would likely benefit from bilateral AFOs to address foot drop found at both ankles and improve her steppage gait. Patient will benefit from  skilled physical therapy intervention to address current body structure impairments and activity limitations to improve function and work towards goals set in current POC in order to return to prior level of function or maximal functional improvement.   OBJECTIVE IMPAIRMENTS Abnormal gait, decreased activity tolerance, decreased balance, decreased coordination, decreased endurance, decreased mobility, difficulty walking, decreased ROM, decreased strength, impaired perceived functional ability, increased muscle spasms, impaired tone, impaired UE functional use, improper body mechanics, postural dysfunction, and pain.    ACTIVITY LIMITATIONS cleaning, community activity, driving, meal prep, laundry, and shopping.    PERSONAL FACTORS Behavior pattern, Fitness, Past/current experiences, Time since onset of injury/illness/exacerbation, Transportation, and 3+ comorbidities:    muscular dystrophy (FSHD - facioscapulohumeral muscular dystrophy), anxiety and depression, bipolar 1 disorder, borderline personality disorder, diabetes, GERD, gout, pancreatitis, alcoholic liver disease, tobacco abuse,  foot drop, neuropathy, recovering alcoholic, history of drug abuse, history of hernia repair are also affecting patient's  functional outcome.      REHAB POTENTIAL: Good   CLINICAL DECISION MAKING: Evolving/moderate complexity   EVALUATION COMPLEXITY: Moderate     GOALS: Goals reviewed with patient? No   SHORT TERM GOALS: Target date: 07/24/2021   Patient will be independent with initial home exercise program for self-management of symptoms. Baseline: Initial HEP to be provided at visit 2 as appropriate (07/10/21); Goal status: Met     LONG TERM GOALS: Target date: 10/02/2021; updated to 12/27/2021 for all unmet goals on 10/04/2021.    Patient will be independent with a long-term home exercise program for self-management of symptoms.  Baseline: Initial HEP to be provided at visit 2 as appropriate (07/10/21); participating as tolerated, recently too much pain to participate (09/04/2021); participating regularly (10/04/2021); participating as able (11/21/2021);  Goal status: In-Progress   2.  Patient will demonstrate improved FOTO score by 10 points from baseline to demonstrate improvement in overall condition and self-reported functional ability.  Baseline: to be measured visit 2 as appropriate (07/10/21); 46 at visit # 2 (07/17/2021); 50 at visit # 10 (09/04/2021); 53 at visit #15 (10/04/2021); 50 at visit # 20 (11/21/2021);  Goal status: In-Progress   3.  Patient will ambulate equal or greater than 1000 feet during 6 Min Walk Test using [facility] or less restrictive assistive device to improve community ambulation.  Baseline: To be tested visit 2 as appropriate (07/10/21); 310 feet with RW (07/17/2021); 556 feet with rollator (09/04/2021); 600 feet with rollator (10/04/2021); 300 feet with rollator - had to stop test at 3 min due to intolerable right glute pain (11/21/2021);  Goal status: In-Progress   4.  Patient will complete 5 Time Sit To Stand test in equal or less than 20 seconds with no UE support from 18.5 inch plinth to demonstrate improved LE functional strength and power for basic mobility and improved fall risk.   Baseline: 24 seconds with B UE on 18.5 inch plinth.  (07/10/21); 20 seconds with B UE on thighs from 18.5 inch plinth (09/04/2021); 19 seconds with B UE on thighs from 18.5 inch plinth (10/04/2021); 22 seconds with B UE on thighs from 18.5 inch plinth (11/21/2021);  Goal status: In-Progress   5.  Patient be able to ascend and descend stairs at her home mod I with use of B handrails.  Baseline: able to ascend/descend 4 steps in the clinic with B UE support on handrails and supervision - reports currently needs assistance to descend steps at home. (07/10/21); able to ascend/descend 4 steps in the clinic with B UE support on  handrails and supervision, leading up and down with left. able to perform with reversed LE leads (09/04/2021); able to ascend/descend 4 steps in the clinic with B UE support on handrails and supervision, step over step with heavy UE use on the way down (10/04/2021); able to ascend/descend 4 steps in the clinic with B UE support on handrails and supervision, step over step up and step to leading with R LE with heavy UE use on the way down (11/21/2021); Goal status: In-Progress   6.  Patient will demonstrate ability to perform static balance in tandem stance for equal or greater than 30 seconds on each side, to demonstrate improved balance and decreased fall risk.  Baseline: right foot front 10 seconds, left foot front 7 seconds (07/10/2021); right foot front 4 seconds, left foot front 6 seconds (09/04/2021); right foot front 9 seconds, left foot front 31 seconds (10/04/2021); right foot front 6.5 seconds, left foot front 8.5 seconds (11/21/2021);  Goal status: In-Progress     PLAN: PT FREQUENCY: 2x/week   PT DURATION: 12 weeks   PLANNED INTERVENTIONS: Therapeutic exercises, Therapeutic activity, Neuromuscular re-education, Balance training, Gait training, Patient/Family education, Joint mobilization, Orthotic/Fit training, DME instructions, Dry Needling, Electrical stimulation, Cryotherapy,  Moist heat, Splintting, Taping, and Manual therapy   PLAN FOR NEXT SESSION: continue with moderate intensity functional strengthening and balance exercises as able, address back and right glute pain to allow return to functional strengthening and balance with better tolerance.      Everlean Alstrom. Graylon Good, PT, DPT 11/21/21, 7:48 PM  [facility] [address] [place], [place] [address] P: [phone] I F: [phone]

## 2021-11-23 ENCOUNTER — Encounter: Payer: Medicare Other | Admitting: Physical Therapy

## 2021-11-28 ENCOUNTER — Ambulatory Visit: Payer: Medicare Other | Admitting: Physical Therapy

## 2021-11-28 ENCOUNTER — Encounter: Payer: Self-pay | Admitting: Physical Therapy

## 2021-11-28 DIAGNOSIS — Z9181 History of falling: Secondary | ICD-10-CM

## 2021-11-28 DIAGNOSIS — M6281 Muscle weakness (generalized): Secondary | ICD-10-CM | POA: Diagnosis not present

## 2021-11-28 DIAGNOSIS — R2689 Other abnormalities of gait and mobility: Secondary | ICD-10-CM

## 2021-11-28 NOTE — Therapy (Signed)
OUTPATIENT PHYSICAL THERAPY TREATMENT NOTE   Patient Name: [name] MRN: [id number] DOB:18-Aug-1975, 46 y.o., female Today's Date: 11/28/2021  PCP: Janine Limbo, PA-C REFERRING PROVIDER: Janine Limbo, PA-C  END OF SESSION:   PT End of Session - 11/28/21 1650     Visit Number 21    Number of Visits 24    Date for PT Re-Evaluation 12/27/21    Authorization Type [employer] MEDICARE/Medicaid reporting period from 11/21/2021    Progress Note Due on Visit 10    PT Start Time 1649    PT Stop Time 1729    PT Time Calculation (min) 40 min    Equipment Utilized During Treatment Gait belt    Activity Tolerance Patient limited by pain    Behavior During Therapy WFL for tasks assessed/performed              Past Medical History:  Diagnosis Date   Alcohol abuse    Anxiety    Anxiety    Anxiety and depression    Asthma    Bipolar disorder ([place])    Borderline personality disorder ([place])    Depression    Diabetes ([place])    Type 2    Dyslipidemia    Esophageal varices ([name])    Foot drop    right   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[place])    GERD (gastroesophageal reflux disease)    Gout    History of drug abuse ([place])    Insomnia    Muscular dystrophies ([place])    Neuropathy    Pancreatitis    Recovering alcoholic ([place])    Past Surgical History:  Procedure Laterality Date   HERNIA REPAIR     IR FLUORO GUIDE CV LINE RIGHT  12/30/2020   IR REMOVAL TUN CV CATH W/O FL  01/19/2021   IR US GUIDE VASC ACCESS RIGHT  12/30/2020   Patient Active Problem List   Diagnosis Date Noted   Frequent falls 02/12/2021   Leukocytosis    Aspiration pneumonia of both lower lobes due to gastric secretions ([place]) 01/04/2021   Pressure injury of skin 12/24/2020   Acute hepatic encephalopathy ([place]) 12/20/2020   Sepsis ([place]) 12/19/2020   Acute hyponatremia 12/18/2020   Alcohol use disorder, moderate, dependence ([place]) 12/18/2020   Hepatic steatosis 12/18/2020   Polysubstance abuse ([place])  12/18/2020   Hyperglycemia due to type 2 diabetes mellitus ([place]) 12/18/2020   Hyperbilirubinemia 12/18/2020   Lactic acidosis 56/25/6389   Alcoholic liver disease ([place]) 12/18/2020   Bipolar 1 disorder, manic, moderate ([place]) 09/10/2018   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   Diabetes ([place]) 09/10/2018   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[place]) 03/28/2018   Chronic gout of foot 09/29/2015   Muscular dystrophy ([place]) 09/29/2015   Type 2 diabetes mellitus without complication ([place]) 37/34/2876   Anxiety and depression 10/01/2012   Protein-calorie malnutrition, severe ([place]) 09/30/2012   Alcohol abuse 09/26/2012   Trichomonas infection 09/26/2012   Dehydration 09/26/2012   Hypokalemia 11/14/2011   Thrombocytopenia ([place]) 11/14/2011   Alcohol withdrawal ([place]) 11/13/2011   Bipolar 2 disorder ([place]) 11/13/2011   Tobacco abuse 11/13/2011   Cocaine abuse ([place]) 11/13/2011   Gout 11/13/2011     REFERRING DIAG: bilateral leg weakness  THERAPY DIAG:  Other abnormalities of gait and mobility  Muscle weakness (generalized)  History of falling  Rationale for Evaluation and Treatment Rehabilitation  PERTINENT HISTORY: Patient is a 46 y.o. female who presents to outpatient physical therapy with a referral for medical diagnosis bilateral leg weakness.  This patient's chief complaints consist of difficulty with weakness and balance with history of frequent falling leading to the following functional deficits: difficulty with basic mobility including ambulation, stairs, transfers; difficulty with ADLs, IADLs. Currently requires assistance to navigate stairs, assistance with ADLs and  IADLs, and ambulates with RW when she previously did not need an assistive device. Relevant past medical history and comorbidities include muscular dystrophy (FSHD - facioscapulohumeral muscular dystrophy), anxiety and depression, bipolar 1 disorder, borderline personality disorder, diabetes, GERD, gout, pancreatitis,  alcoholic liver disease, tobacco abuse,  foot drop, neuropathy, recovering alcoholic, history of drug abuse, history of hernia repair.  Patient denies hx of cancer, stroke, seizures, lung problems, heart problems, unexplained weight loss, unexplained changes in bowel or bladder problems, unexplained stumbling or dropping things, osteoporosis, and spinal surgery  PRECAUTIONS: Fall, not drink alcohol.  SUBJECTIVE: Patient arrives on rollator. She states she "tweaked" her R knee about 30 min before she left to get to PT. She states she stepped and felt her knee but she is not sure what she did to cause the problem. She states her right knee seems to be getting better overall until today when she had a sharp shooting pain that went across the back of her knee.  She states her right glute and right low back continues to bother her but it seems to have been a lot better since manual therapy on last PT session. She states she started her diet/lifestyle change yesterday. She accidentally dropped her blood glucose yesterday which caused her to go over her energy intake goal by about 300 kcal yesterday. She states the difficulty she has been having with her right hand holding things has gotten worse.   PAIN: Yes 5/10 in R knee when moving right low back is 5/10.   OBJECTIVE   TODAY'S TREATMENT:  Manual therapy: to reduce pain and tissue tension, improve range of motion, neuromodulation, in order to promote improved ability to complete functional activities. PRONE - STM to B posterior lumbar paraspinals and right glute.  - CPA and R UPA grade III-IV lower thoracic and lumbar segments focusing on tender segments.   Therapeutic exercise: to centralize symptoms and improve ROM, strength, muscular endurance, and activity tolerance required for successful completion of functional activities.  - seated alternating marching with abdominal brace, 1x10 each side.  - seated trunk twists while holding 2kg med ball,  1x10 each side. With ipsilateral hip flexion/march 1x5 each side.  - seated trunk twists while holding 3kg med ball with ipsilateral hip flexion/march, 2x5 each side.  - standing trunk rotations with self selected stance on airex pad with support for UE available, while holding 3kg med ball. 2x5 each side (R knee pain worsening 2nd set).   Pt required multimodal cuing for proper technique and to facilitate improved neuromuscular control, strength, range of motion, and functional ability resulting in improved performance and form. Requires CGA - SBA for balance exercises.   PATIENT EDUCATION:  Education details: form/technique with exercise. Person educated: Patient Education method: Explanation, demonstration, verbal cuing, tactile cuing. Education comprehension: verbalized understanding, demonstrated understanding, and needs further education     HOME EXERCISE PROGRAM: Access Code: [email or web address] URL: [email or web address] Date: 11/21/2021 Prepared by: Rosita Kea  Exercises - Supine Lower Trunk Rotation  - 2 x daily - 1 sets - 20 reps - 1-5 seconds hold - Single Leg Stretch  - 3-4 x weekly - 3 sets - 6 reps - Supine Bridge with Spinal Articulation  - 3-4 x weekly -  3 sets - 6 reps - Clam Shells in Side Lying  - 3-4 x weekly - 3 sets - 10 reps - Sidelying Hip Abduction  - 3-4 x weekly - 4 sets - 6 reps - 2 seconds hold - Prone Press Up  - 2 x daily - 2 sets - 5 reps    ASSESSMENT:   CLINICAL IMPRESSION: Patient arrives with increased R knee pain after feeling it had been getting better. Had good response to manual therapy to address R low back and hip/glute pain last session so repeated this in more depth today. Patient reported significant improvement in right sided low back and right hip pain but continues to have difficulty with R knee. Completed some exercises for core and balance with limitations from R knee. Plan to continue manual interventions for pain control as needed  to allow improved ability to participate in functional strengthening and balance exercises. Patient would benefit from continued management of limiting condition by skilled physical therapist to address remaining impairments and functional limitations to work towards stated goals and return to PLOF or maximal functional independence.    From PT Eval 07/10/2021:  Patient is a 46 y.o. female referred to outpatient physical therapy with a medical diagnosis of bilateral leg weakness who presents with signs and symptoms consistent with generalized weakness, balance deficits, and motor control problems likely related to deconditioning and FSHD muscular dystrophy. Patient presents with significant balance, motor control, posture, postural, gait, pain, muscle performance (strength/power/endurance) and activity tolerance impairments that are limiting ability to complete basic mobility and self care including ambulation, stairs, transfers, ADLs, and IADLs without difficulty. At eval requires assistance to navigate stairs, assistance with ADLs and  IADLs, and ambulates with RW when she previously did not need an assistive device. Patient would likely benefit from bilateral AFOs to address foot drop found at both ankles and improve her steppage gait. Patient will benefit from skilled physical therapy intervention to address current body structure impairments and activity limitations to improve function and work towards goals set in current POC in order to return to prior level of function or maximal functional improvement.   OBJECTIVE IMPAIRMENTS Abnormal gait, decreased activity tolerance, decreased balance, decreased coordination, decreased endurance, decreased mobility, difficulty walking, decreased ROM, decreased strength, impaired perceived functional ability, increased muscle spasms, impaired tone, impaired UE functional use, improper body mechanics, postural dysfunction, and pain.    ACTIVITY LIMITATIONS cleaning,  community activity, driving, meal prep, laundry, and shopping.    PERSONAL FACTORS Behavior pattern, Fitness, Past/current experiences, Time since onset of injury/illness/exacerbation, Transportation, and 3+ comorbidities:    muscular dystrophy (FSHD - facioscapulohumeral muscular dystrophy), anxiety and depression, bipolar 1 disorder, borderline personality disorder, diabetes, GERD, gout, pancreatitis, alcoholic liver disease, tobacco abuse,  foot drop, neuropathy, recovering alcoholic, history of drug abuse, history of hernia repair are also affecting patient's functional outcome.      REHAB POTENTIAL: Good   CLINICAL DECISION MAKING: Evolving/moderate complexity   EVALUATION COMPLEXITY: Moderate     GOALS: Goals reviewed with patient? No   SHORT TERM GOALS: Target date: 07/24/2021   Patient will be independent with initial home exercise program for self-management of symptoms. Baseline: Initial HEP to be provided at visit 2 as appropriate (07/10/21); Goal status: Met     LONG TERM GOALS: Target date: 10/02/2021; updated to 12/27/2021 for all unmet goals on 10/04/2021.    Patient will be independent with a long-term home exercise program for self-management of symptoms.  Baseline: Initial HEP to  be provided at visit 2 as appropriate (07/10/21); participating as tolerated, recently too much pain to participate (09/04/2021); participating regularly (10/04/2021); participating as able (11/21/2021);  Goal status: In-Progress   2.  Patient will demonstrate improved FOTO score by 10 points from baseline to demonstrate improvement in overall condition and self-reported functional ability.  Baseline: to be measured visit 2 as appropriate (07/10/21); 46 at visit # 2 (07/17/2021); 50 at visit # 10 (09/04/2021); 53 at visit #15 (10/04/2021); 50 at visit # 20 (11/21/2021);  Goal status: In-Progress   3.  Patient will ambulate equal or greater than 1000 feet during 6 Min Walk Test using [facility] or less  restrictive assistive device to improve community ambulation.  Baseline: To be tested visit 2 as appropriate (07/10/21); 310 feet with RW (07/17/2021); 556 feet with rollator (09/04/2021); 600 feet with rollator (10/04/2021); 300 feet with rollator - had to stop test at 3 min due to intolerable right glute pain (11/21/2021);  Goal status: In-Progress   4.  Patient will complete 5 Time Sit To Stand test in equal or less than 20 seconds with no UE support from 18.5 inch plinth to demonstrate improved LE functional strength and power for basic mobility and improved fall risk.  Baseline: 24 seconds with B UE on 18.5 inch plinth.  (07/10/21); 20 seconds with B UE on thighs from 18.5 inch plinth (09/04/2021); 19 seconds with B UE on thighs from 18.5 inch plinth (10/04/2021); 22 seconds with B UE on thighs from 18.5 inch plinth (11/21/2021);  Goal status: In-Progress   5.  Patient be able to ascend and descend stairs at her home mod I with use of B handrails.  Baseline: able to ascend/descend 4 steps in the clinic with B UE support on handrails and supervision - reports currently needs assistance to descend steps at home. (07/10/21); able to ascend/descend 4 steps in the clinic with B UE support on handrails and supervision, leading up and down with left. able to perform with reversed LE leads (09/04/2021); able to ascend/descend 4 steps in the clinic with B UE support on handrails and supervision, step over step with heavy UE use on the way down (10/04/2021); able to ascend/descend 4 steps in the clinic with B UE support on handrails and supervision, step over step up and step to leading with R LE with heavy UE use on the way down (11/21/2021); Goal status: In-Progress   6.  Patient will demonstrate ability to perform static balance in tandem stance for equal or greater than 30 seconds on each side, to demonstrate improved balance and decreased fall risk.  Baseline: right foot front 10 seconds, left foot front 7 seconds  (07/10/2021); right foot front 4 seconds, left foot front 6 seconds (09/04/2021); right foot front 9 seconds, left foot front 31 seconds (10/04/2021); right foot front 6.5 seconds, left foot front 8.5 seconds (11/21/2021);  Goal status: In-Progress     PLAN: PT FREQUENCY: 2x/week   PT DURATION: 12 weeks   PLANNED INTERVENTIONS: Therapeutic exercises, Therapeutic activity, Neuromuscular re-education, Balance training, Gait training, Patient/Family education, Joint mobilization, Orthotic/Fit training, DME instructions, Dry Needling, Electrical stimulation, Cryotherapy, Moist heat, Splintting, Taping, and Manual therapy   PLAN FOR NEXT SESSION: continue with moderate intensity functional strengthening and balance exercises as able, address back and right glute pain to allow return to functional strengthening and balance with better tolerance.      Everlean Alstrom. Graylon Good, PT, DPT 11/28/21, 5:46 PM  [facility]  [place], [place] [address] P: [phone] I F: [phone]

## 2021-11-30 ENCOUNTER — Encounter: Payer: Self-pay | Admitting: Physical Therapy

## 2021-11-30 ENCOUNTER — Ambulatory Visit: Payer: Medicare Other | Admitting: Physical Therapy

## 2021-11-30 DIAGNOSIS — Z9181 History of falling: Secondary | ICD-10-CM | POA: Diagnosis not present

## 2021-11-30 DIAGNOSIS — M6281 Muscle weakness (generalized): Secondary | ICD-10-CM | POA: Diagnosis not present

## 2021-11-30 DIAGNOSIS — R2689 Other abnormalities of gait and mobility: Secondary | ICD-10-CM

## 2021-11-30 NOTE — Therapy (Signed)
OUTPATIENT PHYSICAL THERAPY TREATMENT NOTE   Patient Name: [name] MRN: [id number] DOB:Jan 02, 1976, 46 y.o., female Today's Date: 11/30/2021  PCP: Janine Limbo, PA-C REFERRING PROVIDER: Janine Limbo, PA-C  END OF SESSION:   PT End of Session - 11/30/21 0950     Visit Number 22    Number of Visits 24    Date for PT Re-Evaluation 12/27/21    Authorization Type [employer] MEDICARE/Medicaid reporting period from 11/21/2021    Progress Note Due on Visit 10    PT Start Time 0949    PT Stop Time 1027    PT Time Calculation (min) 38 min    Equipment Utilized During Treatment Gait belt    Activity Tolerance Patient limited by pain    Behavior During Therapy WFL for tasks assessed/performed             Past Medical History:  Diagnosis Date   Alcohol abuse    Anxiety    Anxiety    Anxiety and depression    Asthma    Bipolar disorder ([place])    Borderline personality disorder ([place])    Depression    Diabetes ([place])    Type 2    Dyslipidemia    Esophageal varices ([name])    Foot drop    right   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[place])    GERD (gastroesophageal reflux disease)    Gout    History of drug abuse ([place])    Insomnia    Muscular dystrophies ([place])    Neuropathy    Pancreatitis    Recovering alcoholic ([place])    Past Surgical History:  Procedure Laterality Date   HERNIA REPAIR     IR FLUORO GUIDE CV LINE RIGHT  12/30/2020   IR REMOVAL TUN CV CATH W/O FL  01/19/2021   IR US GUIDE VASC ACCESS RIGHT  12/30/2020   Patient Active Problem List   Diagnosis Date Noted   Frequent falls 02/12/2021   Leukocytosis    Aspiration pneumonia of both lower lobes due to gastric secretions ([place]) 01/04/2021   Pressure injury of skin 12/24/2020   Acute hepatic encephalopathy ([place]) 12/20/2020   Sepsis ([place]) 12/19/2020   Acute hyponatremia 12/18/2020   Alcohol use disorder, moderate, dependence ([place]) 12/18/2020   Hepatic steatosis 12/18/2020   Polysubstance abuse ([place])  12/18/2020   Hyperglycemia due to type 2 diabetes mellitus ([place]) 12/18/2020   Hyperbilirubinemia 12/18/2020   Lactic acidosis 59/74/1638   Alcoholic liver disease ([place]) 12/18/2020   Bipolar 1 disorder, manic, moderate ([place]) 09/10/2018   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/10/2018   Diabetes ([place]) 09/10/2018   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[place]) 03/28/2018   Chronic gout of foot 09/29/2015   Muscular dystrophy ([place]) 09/29/2015   Type 2 diabetes mellitus without complication ([place]) 45/36/4680   Anxiety and depression 10/01/2012   Protein-calorie malnutrition, severe ([place]) 09/30/2012   Alcohol abuse 09/26/2012   Trichomonas infection 09/26/2012   Dehydration 09/26/2012   Hypokalemia 11/14/2011   Thrombocytopenia ([place]) 11/14/2011   Alcohol withdrawal ([place]) 11/13/2011   Bipolar 2 disorder ([place]) 11/13/2011   Tobacco abuse 11/13/2011   Cocaine abuse ([place]) 11/13/2011   Gout 11/13/2011     REFERRING DIAG: bilateral leg weakness  THERAPY DIAG:  Other abnormalities of gait and mobility  Muscle weakness (generalized)  History of falling  Rationale for Evaluation and Treatment Rehabilitation  PERTINENT HISTORY: Patient is a 46 y.o. female who presents to outpatient physical therapy with a referral for medical diagnosis bilateral leg weakness. This  patient's chief complaints consist of difficulty with weakness and balance with history of frequent falling leading to the following functional deficits: difficulty with basic mobility including ambulation, stairs, transfers; difficulty with ADLs, IADLs. Currently requires assistance to navigate stairs, assistance with ADLs and  IADLs, and ambulates with RW when she previously did not need an assistive device. Relevant past medical history and comorbidities include muscular dystrophy (FSHD - facioscapulohumeral muscular dystrophy), anxiety and depression, bipolar 1 disorder, borderline personality disorder, diabetes, GERD, gout, pancreatitis,  alcoholic liver disease, tobacco abuse,  foot drop, neuropathy, recovering alcoholic, history of drug abuse, history of hernia repair.  Patient denies hx of cancer, stroke, seizures, lung problems, heart problems, unexplained weight loss, unexplained changes in bowel or bladder problems, unexplained stumbling or dropping things, osteoporosis, and spinal surgery  PRECAUTIONS: Fall, not drink alcohol.  SUBJECTIVE: Patient arrives on rollator. She states her R knee is really bothering her. She states she is sore from the abdominal muscles down that she attributes to working her abdominal muscles last PT session.  She feels the manual therapy was helpful. She noticed her R calf is larger than her left.   PAIN: Yes 6/10 in R knee when moving right low back is 3/10.   OBJECTIVE   TODAY'S TREATMENT:  Therapeutic exercise: to centralize symptoms and improve ROM, strength, muscular endurance, and activity tolerance required for successful completion of functional activities.  - seated alternating marching with abdominal brace, 1x2 min each side.  - seated trunk twists while holding 3kg med ball with ipsilateral hip flexion/march, 3x10 each side.  - standing head rotations with self selected stance on airex and UE support available, 3x21 - seated R sciatic nerve glide, 1x15.  - seated LAQ, 3x10 each side with 5#AW,   Manual therapy: to reduce pain and tissue tension, improve range of motion, neuromodulation, in order to promote improved ability to complete functional activities. SEATED  - R tibiofemoral joint distraction with PROM knee flex/ext. 2x10 - R tibiofemoral joint IR mobilization, grade III-IV (ER increases pain).    PRONE - STM to B posterior lumbar paraspinals and right glute.  - CPA and R UPA grade III-IV lower thoracic and lumbar segments focusing on tender segments.   Pt required multimodal cuing for proper technique and to acilitate improved neuromuscular control, strength, range of  motion, and functional ability resulting in improved performance and form. Requires CGA - SBA for balance exercises.   PATIENT EDUCATION:  Education details: form/technique with exercise. Person educated: Patient Education method: Explanation, demonstration, verbal cuing, tactile cuing. Education comprehension: verbalized understanding, demonstrated understanding, and needs further education     HOME EXERCISE PROGRAM: Access Code: [email or web address] URL: [email or web address] Date: 11/21/2021 Prepared by: Rosita Kea  Exercises - Supine Lower Trunk Rotation  - 2 x daily - 1 sets - 20 reps - 1-5 seconds hold - Single Leg Stretch  - 3-4 x weekly - 3 sets - 6 reps - Supine Bridge with Spinal Articulation  - 3-4 x weekly - 3 sets - 6 reps - Clam Shells in Side Lying  - 3-4 x weekly - 3 sets - 10 reps - Sidelying Hip Abduction  - 3-4 x weekly - 4 sets - 6 reps - 2 seconds hold - Prone Press Up  - 2 x daily - 2 sets - 5 reps    ASSESSMENT:   CLINICAL IMPRESSION: Patient arrives with continued difficulty with pain in her right knee and back pain. Utilized manual therapy for pain control in knee  and back/R glute with some improvement by end of session. Patient was able to tolerate more quad strengthening and balance exercises this session. She is still very limited in balance and mobility due to her impairments. Patient would benefit from continued management of limiting condition by skilled physical therapist to address remaining impairments and functional limitations to work towards stated goals and return to PLOF or maximal functional independence.    From PT Eval 07/10/2021:  Patient is a 46 y.o. female referred to outpatient physical therapy with a medical diagnosis of bilateral leg weakness who presents with signs and symptoms consistent with generalized weakness, balance deficits, and motor control problems likely related to deconditioning and FSHD muscular dystrophy. Patient presents  with significant balance, motor control, posture, postural, gait, pain, muscle performance (strength/power/endurance) and activity tolerance impairments that are limiting ability to complete basic mobility and self care including ambulation, stairs, transfers, ADLs, and IADLs without difficulty. At eval requires assistance to navigate stairs, assistance with ADLs and  IADLs, and ambulates with RW when she previously did not need an assistive device. Patient would likely benefit from bilateral AFOs to address foot drop found at both ankles and improve her steppage gait. Patient will benefit from skilled physical therapy intervention to address current body structure impairments and activity limitations to improve function and work towards goals set in current POC in order to return to prior level of function or maximal functional improvement.   OBJECTIVE IMPAIRMENTS Abnormal gait, decreased activity tolerance, decreased balance, decreased coordination, decreased endurance, decreased mobility, difficulty walking, decreased ROM, decreased strength, impaired perceived functional ability, increased muscle spasms, impaired tone, impaired UE functional use, improper body mechanics, postural dysfunction, and pain.    ACTIVITY LIMITATIONS cleaning, community activity, driving, meal prep, laundry, and shopping.    PERSONAL FACTORS Behavior pattern, Fitness, Past/current experiences, Time since onset of injury/illness/exacerbation, Transportation, and 3+ comorbidities:    muscular dystrophy (FSHD - facioscapulohumeral muscular dystrophy), anxiety and depression, bipolar 1 disorder, borderline personality disorder, diabetes, GERD, gout, pancreatitis, alcoholic liver disease, tobacco abuse,  foot drop, neuropathy, recovering alcoholic, history of drug abuse, history of hernia repair are also affecting patient's functional outcome.      REHAB POTENTIAL: Good   CLINICAL DECISION MAKING: Evolving/moderate complexity    EVALUATION COMPLEXITY: Moderate     GOALS: Goals reviewed with patient? No   SHORT TERM GOALS: Target date: 07/24/2021   Patient will be independent with initial home exercise program for self-management of symptoms. Baseline: Initial HEP to be provided at visit 2 as appropriate (07/10/21); Goal status: Met     LONG TERM GOALS: Target date: 10/02/2021; updated to 12/27/2021 for all unmet goals on 10/04/2021.    Patient will be independent with a long-term home exercise program for self-management of symptoms.  Baseline: Initial HEP to be provided at visit 2 as appropriate (07/10/21); participating as tolerated, recently too much pain to participate (09/04/2021); participating regularly (10/04/2021); participating as able (11/21/2021);  Goal status: In-Progress   2.  Patient will demonstrate improved FOTO score by 10 points from baseline to demonstrate improvement in overall condition and self-reported functional ability.  Baseline: to be measured visit 2 as appropriate (07/10/21); 46 at visit # 2 (07/17/2021); 50 at visit # 10 (09/04/2021); 53 at visit #15 (10/04/2021); 50 at visit # 20 (11/21/2021);  Goal status: In-Progress   3.  Patient will ambulate equal or greater than 1000 feet during 6 Min Walk Test using [facility] or less restrictive assistive device to improve community ambulation.  Baseline: To be tested visit 2 as appropriate (07/10/21); 310 feet with RW (07/17/2021); 556 feet with rollator (09/04/2021); 600 feet with rollator (10/04/2021); 300 feet with rollator - had to stop test at 3 min due to intolerable right glute pain (11/21/2021);  Goal status: In-Progress   4.  Patient will complete 5 Time Sit To Stand test in equal or less than 20 seconds with no UE support from 18.5 inch plinth to demonstrate improved LE functional strength and power for basic mobility and improved fall risk.  Baseline: 24 seconds with B UE on 18.5 inch plinth.  (07/10/21); 20 seconds with B UE on thighs from 18.5 inch  plinth (09/04/2021); 19 seconds with B UE on thighs from 18.5 inch plinth (10/04/2021); 22 seconds with B UE on thighs from 18.5 inch plinth (11/21/2021);  Goal status: In-Progress   5.  Patient be able to ascend and descend stairs at her home mod I with use of B handrails.  Baseline: able to ascend/descend 4 steps in the clinic with B UE support on handrails and supervision - reports currently needs assistance to descend steps at home. (07/10/21); able to ascend/descend 4 steps in the clinic with B UE support on handrails and supervision, leading up and down with left. able to perform with reversed LE leads (09/04/2021); able to ascend/descend 4 steps in the clinic with B UE support on handrails and supervision, step over step with heavy UE use on the way down (10/04/2021); able to ascend/descend 4 steps in the clinic with B UE support on handrails and supervision, step over step up and step to leading with R LE with heavy UE use on the way down (11/21/2021); Goal status: In-Progress   6.  Patient will demonstrate ability to perform static balance in tandem stance for equal or greater than 30 seconds on each side, to demonstrate improved balance and decreased fall risk.  Baseline: right foot front 10 seconds, left foot front 7 seconds (07/10/2021); right foot front 4 seconds, left foot front 6 seconds (09/04/2021); right foot front 9 seconds, left foot front 31 seconds (10/04/2021); right foot front 6.5 seconds, left foot front 8.5 seconds (11/21/2021);  Goal status: In-Progress     PLAN: PT FREQUENCY: 2x/week   PT DURATION: 12 weeks   PLANNED INTERVENTIONS: Therapeutic exercises, Therapeutic activity, Neuromuscular re-education, Balance training, Gait training, Patient/Family education, Joint mobilization, Orthotic/Fit training, DME instructions, Dry Needling, Electrical stimulation, Cryotherapy, Moist heat, Splintting, Taping, and Manual therapy   PLAN FOR NEXT SESSION: continue with moderate intensity  functional strengthening and balance exercises as able, address back and right glute pain to allow return to functional strengthening and balance with better tolerance.      Everlean Alstrom. Graylon Good, PT, DPT 11/30/21, 7:08 PM  [facility] [address] [place], [place] [address] P: [phone] I F: [phone]

## 2021-12-05 ENCOUNTER — Encounter: Payer: Self-pay | Admitting: Physical Therapy

## 2021-12-05 ENCOUNTER — Ambulatory Visit: Payer: Medicare Other | Admitting: Physical Therapy

## 2021-12-05 DIAGNOSIS — Z9181 History of falling: Secondary | ICD-10-CM

## 2021-12-05 DIAGNOSIS — M6281 Muscle weakness (generalized): Secondary | ICD-10-CM

## 2021-12-05 DIAGNOSIS — R2689 Other abnormalities of gait and mobility: Secondary | ICD-10-CM

## 2021-12-05 NOTE — Therapy (Signed)
OUTPATIENT PHYSICAL THERAPY TREATMENT NOTE   Patient Name: [name] MRN: [id number] DOB:Mar 25, 1975, 46 y.o., female Today's Date: 12/05/2021  PCP: Janine Limbo, PA-C REFERRING PROVIDER: Janine Limbo, PA-C  END OF SESSION:   PT End of Session - 12/05/21 0951     Visit Number 23    Number of Visits 24    Date for PT Re-Evaluation 12/27/21    Authorization Type [employer] MEDICARE/Medicaid reporting period from 11/21/2021    Progress Note Due on Visit 10    PT Start Time 0949    PT Stop Time 1027    PT Time Calculation (min) 38 min    Equipment Utilized During Treatment Gait belt    Activity Tolerance Patient limited by pain    Behavior During Therapy WFL for tasks assessed/performed              Past Medical History:  Diagnosis Date   Alcohol abuse    Anxiety    Anxiety    Anxiety and depression    Asthma    Bipolar disorder ([place])    Borderline personality disorder ([place])    Depression    Diabetes ([place])    Type 2    Dyslipidemia    Esophageal varices ([name])    Foot drop    right   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[place])    GERD (gastroesophageal reflux disease)    Gout    History of drug abuse ([place])    Insomnia    Muscular dystrophies ([place])    Neuropathy    Pancreatitis    Recovering alcoholic ([place])    Past Surgical History:  Procedure Laterality Date   HERNIA REPAIR     IR FLUORO GUIDE CV LINE RIGHT  12/30/2020   IR REMOVAL TUN CV CATH W/O FL  01/19/2021   IR US GUIDE VASC ACCESS RIGHT  12/30/2020   Patient Active Problem List   Diagnosis Date Noted   Frequent falls 02/12/2021   Leukocytosis    Aspiration pneumonia of both lower lobes due to gastric secretions ([place]) 01/04/2021   Pressure injury of skin 12/24/2020   Acute hepatic encephalopathy ([place]) 12/20/2020   Sepsis ([place]) 12/19/2020   Acute hyponatremia 12/18/2020   Alcohol use disorder, moderate, dependence ([place]) 12/18/2020   Hepatic steatosis 12/18/2020   Polysubstance abuse ([place])  12/18/2020   Hyperglycemia due to type 2 diabetes mellitus ([place]) 12/18/2020   Hyperbilirubinemia 12/18/2020   Lactic acidosis 37/90/2409   Alcoholic liver disease ([place]) 12/18/2020   Bipolar 1 disorder, manic, moderate ([place]) 09/10/2018   Amphetamine abuse ([place]) 09/10/2018   Diabetes ([place]) 09/10/2018   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) 03/28/2018   Chronic gout of foot 09/29/2015   Muscular dystrophy ([place]) 09/29/2015   Type 2 diabetes mellitus without complication ([place]) 73/53/2992   Anxiety and depression 10/01/2012   Protein-calorie malnutrition, severe ([place]) 09/30/2012   Alcohol abuse 09/26/2012   Trichomonas infection 09/26/2012   Dehydration 09/26/2012   Hypokalemia 11/14/2011   Thrombocytopenia ([place]) 11/14/2011   Alcohol withdrawal ([place]) 11/13/2011   Bipolar 2 disorder ([place]) 11/13/2011   Tobacco abuse 11/13/2011   Cocaine abuse ([place]) 11/13/2011   Gout 11/13/2011     REFERRING DIAG: bilateral leg weakness  THERAPY DIAG:  Other abnormalities of gait and mobility  Muscle weakness (generalized)  History of falling  Rationale for Evaluation and Treatment Rehabilitation  PERTINENT HISTORY: Patient is a 46 y.o. female who presents to outpatient physical therapy with a referral for medical diagnosis bilateral leg weakness.  This patient's chief complaints consist of difficulty with weakness and balance with history of frequent falling leading to the following functional deficits: difficulty with basic mobility including ambulation, stairs, transfers; difficulty with ADLs, IADLs. Currently requires assistance to navigate stairs, assistance with ADLs and  IADLs, and ambulates with RW when she previously did not need an assistive device. Relevant past medical history and comorbidities include muscular dystrophy (FSHD - facioscapulohumeral muscular dystrophy), anxiety and depression, bipolar 1 disorder, borderline personality disorder, diabetes, GERD, gout, pancreatitis,  alcoholic liver disease, tobacco abuse,  foot drop, neuropathy, recovering alcoholic, history of drug abuse, history of hernia repair.  Patient denies hx of cancer, stroke, seizures, lung problems, heart problems, unexplained weight loss, unexplained changes in bowel or bladder problems, unexplained stumbling or dropping things, osteoporosis, and spinal surgery  PRECAUTIONS: Fall, not drink alcohol.  SUBJECTIVE: Patient arrives on rollator. She states her R knee is much better after the manual therapy at last PT session.   PAIN: Yes 3/10 low back and right hip  OBJECTIVE   TODAY'S TREATMENT:  Therapeutic exercise: to centralize symptoms and improve ROM, strength, muscular endurance, and activity tolerance required for successful completion of functional activities.  - seated alternating marching with abdominal brace, 1x2 min (too long).  - seated trunk twists while holding 3kg med ball with ipsilateral hip flexion/march, 3x10 each side. - seated B ankle dorsiflexion AROM, 3x15. - seated LAQ, 3x10 each side with 7.5#AW,  - standing head rotations with self selected stance on airex and UE support available, 3x21, 1x15  Manual therapy: to reduce pain and tissue tension, improve range of motion, neuromodulation, in order to promote improved ability to complete functional activities. SEATED  - R tibiofemoral joint distraction with PROM knee flex/ext. 1x10 - R tibiofemoral joint IR mobilization, grade III-IV   PRONE - STM to B posterior lumbar paraspinals and right glute.  - CPA and R UPA grade III-IV lower thoracic and lumbar segments focusing on tender segments.   Pt required multimodal cuing for proper technique and to acilitate improved neuromuscular control, strength, range of motion, and functional ability resulting in improved performance and form. Requires CGA - SBA for balance exercises.   PATIENT EDUCATION:  Education details: form/technique with exercise. Person educated:  Patient Education method: Explanation, demonstration, verbal cuing, tactile cuing. Education comprehension: verbalized understanding, demonstrated understanding, and needs further education     HOME EXERCISE PROGRAM: Access Code: [email or web address] URL: [email or web address] Date: 11/21/2021 Prepared by: Rosita Kea  Exercises - Supine Lower Trunk Rotation  - 2 x daily - 1 sets - 20 reps - 1-5 seconds hold - Single Leg Stretch  - 3-4 x weekly - 3 sets - 6 reps - Supine Bridge with Spinal Articulation  - 3-4 x weekly - 3 sets - 6 reps - Clam Shells in Side Lying  - 3-4 x weekly - 3 sets - 10 reps - Sidelying Hip Abduction  - 3-4 x weekly - 4 sets - 6 reps - 2 seconds hold - Prone Press Up  - 2 x daily - 2 sets - 5 reps    ASSESSMENT:   CLINICAL IMPRESSION: Patient arrives with significantly improved pain in R knee and better controlled low back and right hip pain following last PT session. Continued with similar interventions today with gentle progressions on knee extension exercises with good results. Plan to continue incorporating manual therapy as needed and progressing balance and strengthening exercises as tolerated. Patient with significant tender and tight soft tissue at right deep  hip rotators and glutes. Patient would benefit from continued management of limiting condition by skilled physical therapist to address remaining impairments and functional limitations to work towards stated goals and return to PLOF or maximal functional independence.    From PT Eval 07/10/2021:  Patient is a 46 y.o. female referred to outpatient physical therapy with a medical diagnosis of bilateral leg weakness who presents with signs and symptoms consistent with generalized weakness, balance deficits, and motor control problems likely related to deconditioning and FSHD muscular dystrophy. Patient presents with significant balance, motor control, posture, postural, gait, pain, muscle performance  (strength/power/endurance) and activity tolerance impairments that are limiting ability to complete basic mobility and self care including ambulation, stairs, transfers, ADLs, and IADLs without difficulty. At eval requires assistance to navigate stairs, assistance with ADLs and  IADLs, and ambulates with RW when she previously did not need an assistive device. Patient would likely benefit from bilateral AFOs to address foot drop found at both ankles and improve her steppage gait. Patient will benefit from skilled physical therapy intervention to address current body structure impairments and activity limitations to improve function and work towards goals set in current POC in order to return to prior level of function or maximal functional improvement.   OBJECTIVE IMPAIRMENTS Abnormal gait, decreased activity tolerance, decreased balance, decreased coordination, decreased endurance, decreased mobility, difficulty walking, decreased ROM, decreased strength, impaired perceived functional ability, increased muscle spasms, impaired tone, impaired UE functional use, improper body mechanics, postural dysfunction, and pain.    ACTIVITY LIMITATIONS cleaning, community activity, driving, meal prep, laundry, and shopping.    PERSONAL FACTORS Behavior pattern, Fitness, Past/current experiences, Time since onset of injury/illness/exacerbation, Transportation, and 3+ comorbidities:    muscular dystrophy (FSHD - facioscapulohumeral muscular dystrophy), anxiety and depression, bipolar 1 disorder, borderline personality disorder, diabetes, GERD, gout, pancreatitis, alcoholic liver disease, tobacco abuse,  foot drop, neuropathy, recovering alcoholic, history of drug abuse, history of hernia repair are also affecting patient's functional outcome.      REHAB POTENTIAL: Good   CLINICAL DECISION MAKING: Evolving/moderate complexity   EVALUATION COMPLEXITY: Moderate     GOALS: Goals reviewed with patient? No   SHORT  TERM GOALS: Target date: 07/24/2021   Patient will be independent with initial home exercise program for self-management of symptoms. Baseline: Initial HEP to be provided at visit 2 as appropriate (07/10/21); Goal status: Met     LONG TERM GOALS: Target date: 10/02/2021; updated to 12/27/2021 for Dixon unmet goals on 10/04/2021.    Patient will be independent with a long-term home exercise program for self-management of symptoms.  Baseline: Initial HEP to be provided at visit 2 as appropriate (07/10/21); participating as tolerated, recently too much pain to participate (09/04/2021); participating regularly (10/04/2021); participating as able (11/21/2021);  Goal status: In-Progress   2.  Patient will demonstrate improved FOTO score by 10 points from baseline to demonstrate improvement in overall condition and self-reported functional ability.  Baseline: to be measured visit 2 as appropriate (07/10/21); 46 at visit # 2 (07/17/2021); 50 at visit # 10 (09/04/2021); 53 at visit #15 (10/04/2021); 50 at visit # 20 (11/21/2021);  Goal status: In-Progress   3.  Patient will ambulate equal or greater than 1000 feet during 6 Min Walk Test using [facility] or less restrictive assistive device to improve community ambulation.  Baseline: To be tested visit 2 as appropriate (07/10/21); 310 feet with RW (07/17/2021); 556 feet with rollator (09/04/2021); 600 feet with rollator (10/04/2021); 300 feet with rollator - had to  stop test at 3 min due to intolerable right glute pain (11/21/2021);  Goal status: In-Progress   4.  Patient will complete 5 Time Sit To Stand test in equal or less than 20 seconds with no UE support from 18.5 inch plinth to demonstrate improved LE functional strength and power for basic mobility and improved fall risk.  Baseline: 24 seconds with B UE on 18.5 inch plinth.  (07/10/21); 20 seconds with B UE on thighs from 18.5 inch plinth (09/04/2021); 19 seconds with B UE on thighs from 18.5 inch plinth (10/04/2021); 22  seconds with B UE on thighs from 18.5 inch plinth (11/21/2021);  Goal status: In-Progress   5.  Patient be able to ascend and descend stairs at her home mod I with use of B handrails.  Baseline: able to ascend/descend 4 steps in the clinic with B UE support on handrails and supervision - reports currently needs assistance to descend steps at home. (07/10/21); able to ascend/descend 4 steps in the clinic with B UE support on handrails and supervision, leading up and down with left. able to perform with reversed LE leads (09/04/2021); able to ascend/descend 4 steps in the clinic with B UE support on handrails and supervision, step over step with heavy UE use on the way down (10/04/2021); able to ascend/descend 4 steps in the clinic with B UE support on handrails and supervision, step over step up and step to leading with R LE with heavy UE use on the way down (11/21/2021); Goal status: In-Progress   6.  Patient will demonstrate ability to perform static balance in tandem stance for equal or greater than 30 seconds on each side, to demonstrate improved balance and decreased fall risk.  Baseline: right foot front 10 seconds, left foot front 7 seconds (07/10/2021); right foot front 4 seconds, left foot front 6 seconds (09/04/2021); right foot front 9 seconds, left foot front 31 seconds (10/04/2021); right foot front 6.5 seconds, left foot front 8.5 seconds (11/21/2021);  Goal status: In-Progress     PLAN: PT FREQUENCY: 2x/week   PT DURATION: 12 weeks   PLANNED INTERVENTIONS: Therapeutic exercises, Therapeutic activity, Neuromuscular re-education, Balance training, Gait training, Patient/Family education, Joint mobilization, Orthotic/Fit training, DME instructions, Dry Needling, Electrical stimulation, Cryotherapy, Moist heat, Splintting, Taping, and Manual therapy   PLAN FOR NEXT SESSION: continue with moderate intensity functional strengthening and balance exercises as able, address back and right glute pain to  allow return to functional strengthening and balance with better tolerance.      Everlean Alstrom. Graylon Good, PT, DPT 12/05/21, 8:48 PM  [facility] [address] [place], [place] [address] P: [phone] I F: [phone]

## 2021-12-11 ENCOUNTER — Ambulatory Visit: Payer: Medicare Other | Admitting: Gastroenterology

## 2021-12-12 ENCOUNTER — Ambulatory Visit: Payer: Medicare Other | Admitting: Physical Therapy

## 2021-12-14 ENCOUNTER — Encounter: Payer: Self-pay | Admitting: Physical Therapy

## 2021-12-14 ENCOUNTER — Ambulatory Visit: Payer: Medicare Other | Admitting: Physical Therapy

## 2021-12-14 DIAGNOSIS — R2689 Other abnormalities of gait and mobility: Secondary | ICD-10-CM

## 2021-12-14 DIAGNOSIS — M6281 Muscle weakness (generalized): Secondary | ICD-10-CM

## 2021-12-14 DIAGNOSIS — Z9181 History of falling: Secondary | ICD-10-CM | POA: Diagnosis not present

## 2021-12-14 NOTE — Therapy (Signed)
OUTPATIENT PHYSICAL THERAPY TREATMENT NOTE   Patient Name: [name] MRN: [id number] DOB:02/10/1976, 46 y.o., female Today's Date: 12/14/2021  PCP: Janine Limbo, PA-C REFERRING PROVIDER: Janine Limbo, PA-C  END OF SESSION:   PT End of Session - 12/14/21 2045     Visit Number 24    Number of Visits 38    Date for PT Re-Evaluation 12/27/21    Authorization Type [employer] MEDICARE/Medicaid reporting period from 11/21/2021    Progress Note Due on Visit 10    PT Start Time 0950    PT Stop Time 1030    PT Time Calculation (min) 40 min    Equipment Utilized During Treatment Gait belt    Activity Tolerance Patient limited by pain;Patient limited by fatigue    Behavior During Therapy [facility] for tasks assessed/performed               Past Medical History:  Diagnosis Date   Alcohol abuse    Anxiety    Anxiety    Anxiety and depression    Asthma    Bipolar disorder ([place])    Borderline personality disorder ([place])    Depression    Diabetes ([place])    Type 2    Dyslipidemia    Esophageal varices ([name])    Foot drop    right   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[place])    GERD (gastroesophageal reflux disease)    Gout    History of drug abuse ([place])    Insomnia    Muscular dystrophies ([place])    Neuropathy    Pancreatitis    Recovering alcoholic ([place])    Past Surgical History:  Procedure Laterality Date   HERNIA REPAIR     IR FLUORO GUIDE CV LINE RIGHT  12/30/2020   IR REMOVAL TUN CV CATH W/O FL  01/19/2021   IR US GUIDE VASC ACCESS RIGHT  12/30/2020   Patient Active Problem List   Diagnosis Date Noted   Frequent falls 02/12/2021   Leukocytosis    Aspiration pneumonia of both lower lobes due to gastric secretions ([place]) 01/04/2021   Pressure injury of skin 12/24/2020   Acute hepatic encephalopathy ([place]) 12/20/2020   Sepsis ([place]) 12/19/2020   Acute hyponatremia 12/18/2020   Alcohol use disorder, moderate, dependence ([place]) 12/18/2020   Hepatic steatosis 12/18/2020    Polysubstance abuse ([place]) 12/18/2020   Hyperglycemia due to type 2 diabetes mellitus ([place]) 12/18/2020   Hyperbilirubinemia 12/18/2020   Lactic acidosis 03/54/6568   Alcoholic liver disease ([place]) 12/18/2020   Bipolar 1 disorder, manic, moderate ([place]) 09/10/2018   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   Diabetes ([place]) 09/10/2018   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[place]) 03/28/2018   Chronic gout of foot 09/29/2015   Muscular dystrophy ([place]) 09/29/2015   Type 2 diabetes mellitus without complication ([place]) 12/75/1700   Anxiety and depression 10/01/2012   Protein-calorie malnutrition, severe ([place]) 09/30/2012   Alcohol abuse 09/26/2012   Trichomonas infection 09/26/2012   Dehydration 09/26/2012   Hypokalemia 11/14/2011   Thrombocytopenia ([place]) 11/14/2011   Alcohol withdrawal ([place]) 11/13/2011   Bipolar 2 disorder ([place]) 11/13/2011   Tobacco abuse 11/13/2011   Cocaine abuse ([place]) 11/13/2011   Gout 11/13/2011     REFERRING DIAG: bilateral leg weakness  THERAPY DIAG:  Other abnormalities of gait and mobility  Muscle weakness (generalized)  History of falling  Rationale for Evaluation and Treatment Rehabilitation  PERTINENT HISTORY: Patient is a 46 y.o. female who presents to outpatient physical therapy with a referral for medical  diagnosis bilateral leg weakness. This patient's chief complaints consist of difficulty with weakness and balance with history of frequent falling leading to the following functional deficits: difficulty with basic mobility including ambulation, stairs, transfers; difficulty with ADLs, IADLs. Currently requires assistance to navigate stairs, assistance with ADLs and  IADLs, and ambulates with RW when she previously did not need an assistive device. Relevant past medical history and comorbidities include muscular dystrophy (FSHD - facioscapulohumeral muscular dystrophy), anxiety and depression, bipolar 1 disorder, borderline personality disorder, diabetes,  GERD, gout, pancreatitis, alcoholic liver disease, tobacco abuse,  foot drop, neuropathy, recovering alcoholic, history of drug abuse, history of hernia repair.  Patient denies hx of cancer, stroke, seizures, lung problems, heart problems, unexplained weight loss, unexplained changes in bowel or bladder problems, unexplained stumbling or dropping things, osteoporosis, and spinal surgery  PRECAUTIONS: Fall, not drink alcohol.  SUBJECTIVE: Patient arrives on rollator. She missed her last PT session due to a day of pain all over her body. She states she occasionally has days where she feels like someone beat her with a garden hose and she thinks this is related to muscular dystrophy and what was going on two days ago. She states she feels like what she is doing in PT is helping. Her R knee continues to bother her at the lateral aspect but is overall better after manual techniques. She states her back and right hip has been feeling better after manual techniques as well. She went to [facility] and they determined she got her last AFOs 3 years ago and insurance usually covers new ones every 5 years. She has a follow up in a week.   PAIN: Yes 3/10 low back and right hip, lateral R knee.   OBJECTIVE   TODAY'S TREATMENT:  Therapeutic exercise: to centralize symptoms and improve ROM, strength, muscular endurance, and activity tolerance required for successful completion of functional activities.  - seated alternating marching with abdominal brace, 2x1 min. - seated trunk twists while holding 3kg med ball with ipsilateral hip flexion/march, 3x10 each side. - seated B ankle dorsiflexion AROM, 3x15. - seated LAQ, 3x10 each side with 10#AW - sidelying R hip abduction, 1x10 (painful at lateral knee but doable).  - Education on HEP including handout   Manual therapy: to reduce pain and tissue tension, improve range of motion, neuromodulation, in order to promote improved ability to complete functional  activities. SEATED  - R tibiofemoral joint distraction with PROM knee flex/ext. 1x10 - R tibiofemoral joint IR mobilization, grade III-IV   PRONE - STM to B posterior lumbar paraspinals and right glute.  - CPA and R UPA grade III-IV lower thoracic and lumbar segments focusing on tender segments.   Pt required multimodal cuing for proper technique and to acilitate improved neuromuscular control, strength, range of motion, and functional ability resulting in improved performance and form. Requires CGA - SBA for balance exercises.   PATIENT EDUCATION:  Education details: form/technique with exercise. Person educated: Patient Education method: Explanation, demonstration, verbal cuing, tactile cuing. Education comprehension: verbalized understanding, demonstrated understanding, and needs further education     HOME EXERCISE PROGRAM: Access Code: [email or web address] URL: [email or web address] Date: 12/14/2021 Prepared by: Rosita Kea  Exercises - Supine Lower Trunk Rotation  - 2 x daily - 1 sets - 20 reps - 1-5 seconds hold - Prone Press Up  - 2 x daily - 2 sets - 5 reps - Seated Toe Raise  - 1 x daily - 3 sets - 15 reps - 1 second  hold  HOME EXERCISE PROGRAM [[id number]]  Seated marching with weighted twist -  Repeat 10 Times, Hold 1 Second(s), Complete 3 Sets, Perform 1 Times a Day    ASSESSMENT:   CLINICAL IMPRESSION: Patient arrives with continued improvement of R knee pain, low back pain, and right glute pain. Continued with similar interventions with gradual progression of exercises including return to R sidelying hip abduction which was uncomfortable but improved tolerance compared to previously. Plan to continue with progression to progressive LE strengthening and balance with interventions to address core weakness and pain as needed to allow patient to continue working on LE weakness. Patient would benefit from continued management of limiting condition by skilled physical  therapist to address remaining impairments and functional limitations to work towards stated goals and return to PLOF or maximal functional independence.    From PT Eval 07/10/2021:  Patient is a 46 y.o. female referred to outpatient physical therapy with a medical diagnosis of bilateral leg weakness who presents with signs and symptoms consistent with generalized weakness, balance deficits, and motor control problems likely related to deconditioning and FSHD muscular dystrophy. Patient presents with significant balance, motor control, posture, postural, gait, pain, muscle performance (strength/power/endurance) and activity tolerance impairments that are limiting ability to complete basic mobility and self care including ambulation, stairs, transfers, ADLs, and IADLs without difficulty. At eval requires assistance to navigate stairs, assistance with ADLs and  IADLs, and ambulates with RW when she previously did not need an assistive device. Patient would likely benefit from bilateral AFOs to address foot drop found at both ankles and improve her steppage gait. Patient will benefit from skilled physical therapy intervention to address current body structure impairments and activity limitations to improve function and work towards goals set in current POC in order to return to prior level of function or maximal functional improvement.   OBJECTIVE IMPAIRMENTS Abnormal gait, decreased activity tolerance, decreased balance, decreased coordination, decreased endurance, decreased mobility, difficulty walking, decreased ROM, decreased strength, impaired perceived functional ability, increased muscle spasms, impaired tone, impaired UE functional use, improper body mechanics, postural dysfunction, and pain.    ACTIVITY LIMITATIONS cleaning, community activity, driving, meal prep, laundry, and shopping.    PERSONAL FACTORS Behavior pattern, Fitness, Past/current experiences, Time since onset of  injury/illness/exacerbation, Transportation, and 3+ comorbidities:    muscular dystrophy (FSHD - facioscapulohumeral muscular dystrophy), anxiety and depression, bipolar 1 disorder, borderline personality disorder, diabetes, GERD, gout, pancreatitis, alcoholic liver disease, tobacco abuse,  foot drop, neuropathy, recovering alcoholic, history of drug abuse, history of hernia repair are also affecting patient's functional outcome.      REHAB POTENTIAL: Good   CLINICAL DECISION MAKING: Evolving/moderate complexity   EVALUATION COMPLEXITY: Moderate     GOALS: Goals reviewed with patient? No   SHORT TERM GOALS: Target date: 07/24/2021   Patient will be independent with initial home exercise program for self-management of symptoms. Baseline: Initial HEP to be provided at visit 2 as appropriate (07/10/21); Goal status: Met     LONG TERM GOALS: Target date: 10/02/2021; updated to 12/27/2021 for all unmet goals on 10/04/2021.    Patient will be independent with a long-term home exercise program for self-management of symptoms.  Baseline: Initial HEP to be provided at visit 2 as appropriate (07/10/21); participating as tolerated, recently too much pain to participate (09/04/2021); participating regularly (10/04/2021); participating as able (11/21/2021);  Goal status: In-Progress   2.  Patient will demonstrate improved FOTO score by 10 points from baseline to demonstrate improvement in overall  condition and self-reported functional ability.  Baseline: to be measured visit 2 as appropriate (07/10/21); 46 at visit # 2 (07/17/2021); 50 at visit # 10 (09/04/2021); 53 at visit #15 (10/04/2021); 50 at visit # 20 (11/21/2021);  Goal status: In-Progress   3.  Patient will ambulate equal or greater than 1000 feet during 6 Min Walk Test using [facility] or less restrictive assistive device to improve community ambulation.  Baseline: To be tested visit 2 as appropriate (07/10/21); 310 feet with RW (07/17/2021); 556 feet with  rollator (09/04/2021); 600 feet with rollator (10/04/2021); 300 feet with rollator - had to stop test at 3 min due to intolerable right glute pain (11/21/2021);  Goal status: In-Progress   4.  Patient will complete 5 Time Sit To Stand test in equal or less than 20 seconds with no UE support from 18.5 inch plinth to demonstrate improved LE functional strength and power for basic mobility and improved fall risk.  Baseline: 24 seconds with B UE on 18.5 inch plinth.  (07/10/21); 20 seconds with B UE on thighs from 18.5 inch plinth (09/04/2021); 19 seconds with B UE on thighs from 18.5 inch plinth (10/04/2021); 22 seconds with B UE on thighs from 18.5 inch plinth (11/21/2021);  Goal status: In-Progress   5.  Patient be able to ascend and descend stairs at her home mod I with use of B handrails.  Baseline: able to ascend/descend 4 steps in the clinic with B UE support on handrails and supervision - reports currently needs assistance to descend steps at home. (07/10/21); able to ascend/descend 4 steps in the clinic with B UE support on handrails and supervision, leading up and down with left. able to perform with reversed LE leads (09/04/2021); able to ascend/descend 4 steps in the clinic with B UE support on handrails and supervision, step over step with heavy UE use on the way down (10/04/2021); able to ascend/descend 4 steps in the clinic with B UE support on handrails and supervision, step over step up and step to leading with R LE with heavy UE use on the way down (11/21/2021); Goal status: In-Progress   6.  Patient will demonstrate ability to perform static balance in tandem stance for equal or greater than 30 seconds on each side, to demonstrate improved balance and decreased fall risk.  Baseline: right foot front 10 seconds, left foot front 7 seconds (07/10/2021); right foot front 4 seconds, left foot front 6 seconds (09/04/2021); right foot front 9 seconds, left foot front 31 seconds (10/04/2021); right foot front  6.5 seconds, left foot front 8.5 seconds (11/21/2021);  Goal status: In-Progress     PLAN: PT FREQUENCY: 2x/week   PT DURATION: 12 weeks   PLANNED INTERVENTIONS: Therapeutic exercises, Therapeutic activity, Neuromuscular re-education, Balance training, Gait training, Patient/Family education, Joint mobilization, Orthotic/Fit training, DME instructions, Dry Needling, Electrical stimulation, Cryotherapy, Moist heat, Splintting, Taping, and Manual therapy   PLAN FOR NEXT SESSION: continue with moderate intensity functional strengthening and balance exercises as able, address back and right glute pain to allow return to functional strengthening and balance with better tolerance.      Everlean Alstrom. Graylon Good, PT, DPT 12/14/21, 8:53 PM  [facility] [address] [place], [place] [address] P: [phone] I F: [phone]

## 2021-12-18 ENCOUNTER — Encounter: Payer: Self-pay | Admitting: Physical Therapy

## 2021-12-18 ENCOUNTER — Ambulatory Visit: Payer: Medicare Other | Attending: Internal Medicine

## 2021-12-18 DIAGNOSIS — R2689 Other abnormalities of gait and mobility: Secondary | ICD-10-CM | POA: Diagnosis not present

## 2021-12-18 DIAGNOSIS — Z9181 History of falling: Secondary | ICD-10-CM | POA: Insufficient documentation

## 2021-12-18 DIAGNOSIS — M6281 Muscle weakness (generalized): Secondary | ICD-10-CM | POA: Insufficient documentation

## 2021-12-18 NOTE — Therapy (Signed)
OUTPATIENT PHYSICAL THERAPY TREATMENT NOTE   Patient Name: [name] MRN: [id number] DOB:07-28-1975, 46 y.o., female Today's Date: 12/18/2021  PCP: Janine Limbo, PA-C REFERRING PROVIDER: Janine Limbo, PA-C  END OF SESSION:   PT End of Session - 12/18/21 1033     Visit Number 25    Number of Visits 38    Date for PT Re-Evaluation 12/27/21    Authorization Type [employer] MEDICARE/Medicaid reporting period from 11/21/2021    Progress Note Due on Visit 10    PT Start Time 1031    PT Stop Time 1115    PT Time Calculation (min) 44 min    Equipment Utilized During Treatment Gait belt    Activity Tolerance Patient limited by pain;Patient limited by fatigue    Behavior During Therapy [facility] for tasks assessed/performed               Past Medical History:  Diagnosis Date   Alcohol abuse    Anxiety    Anxiety    Anxiety and depression    Asthma    Bipolar disorder ([place])    Borderline personality disorder ([place])    Depression    Diabetes ([place])    Type 2    Dyslipidemia    Esophageal varices ([name])    Foot drop    right   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[place])    GERD (gastroesophageal reflux disease)    Gout    History of drug abuse ([place])    Insomnia    Muscular dystrophies ([place])    Neuropathy    Pancreatitis    Recovering alcoholic ([place])    Past Surgical History:  Procedure Laterality Date   HERNIA REPAIR     IR FLUORO GUIDE CV LINE RIGHT  12/30/2020   IR REMOVAL TUN CV CATH W/O FL  01/19/2021   IR US GUIDE VASC ACCESS RIGHT  12/30/2020   Patient Active Problem List   Diagnosis Date Noted   Frequent falls 02/12/2021   Leukocytosis    Aspiration pneumonia of both lower lobes due to gastric secretions ([place]) 01/04/2021   Pressure injury of skin 12/24/2020   Acute hepatic encephalopathy ([place]) 12/20/2020   Sepsis ([place]) 12/19/2020   Acute hyponatremia 12/18/2020   Alcohol use disorder, moderate, dependence ([place]) 12/18/2020   Hepatic steatosis 12/18/2020    Polysubstance abuse ([place]) 12/18/2020   Hyperglycemia due to type 2 diabetes mellitus ([place]) 12/18/2020   Hyperbilirubinemia 12/18/2020   Lactic acidosis 27/25/3664   Alcoholic liver disease ([place]) 12/18/2020   Bipolar 1 disorder, manic, moderate ([place]) 09/10/2018   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   Diabetes ([place]) 09/10/2018   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[place]) 03/28/2018   Chronic gout of foot 09/29/2015   Muscular dystrophy ([place]) 09/29/2015   Type 2 diabetes mellitus without complication ([place]) 40/34/7425   Anxiety and depression 10/01/2012   Protein-calorie malnutrition, severe ([place]) 09/30/2012   Alcohol abuse 09/26/2012   Trichomonas infection 09/26/2012   Dehydration 09/26/2012   Hypokalemia 11/14/2011   Thrombocytopenia ([place]) 11/14/2011   Alcohol withdrawal ([place]) 11/13/2011   Bipolar 2 disorder ([place]) 11/13/2011   Tobacco abuse 11/13/2011   Cocaine abuse ([place]) 11/13/2011   Gout 11/13/2011     REFERRING DIAG: bilateral leg weakness  THERAPY DIAG:  Other abnormalities of gait and mobility  Muscle weakness (generalized)  History of falling  Rationale for Evaluation and Treatment Rehabilitation  PERTINENT HISTORY: Patient is a 46 y.o. female who presents to outpatient physical therapy with a referral for medical  diagnosis bilateral leg weakness. This patient's chief complaints consist of difficulty with weakness and balance with history of frequent falling leading to the following functional deficits: difficulty with basic mobility including ambulation, stairs, transfers; difficulty with ADLs, IADLs. Currently requires assistance to navigate stairs, assistance with ADLs and  IADLs, and ambulates with RW when she previously did not need an assistive device. Relevant past medical history and comorbidities include muscular dystrophy (FSHD - facioscapulohumeral muscular dystrophy), anxiety and depression, bipolar 1 disorder, borderline personality disorder, diabetes,  GERD, gout, pancreatitis, alcoholic liver disease, tobacco abuse,  foot drop, neuropathy, recovering alcoholic, history of drug abuse, history of hernia repair.  Patient denies hx of cancer, stroke, seizures, lung problems, heart problems, unexplained weight loss, unexplained changes in bowel or bladder problems, unexplained stumbling or dropping things, osteoporosis, and spinal surgery  PRECAUTIONS: Fall, not drink alcohol.  SUBJECTIVE: Patient reports pain currently 2.5/10. Pain improving in RLE with hands on techniques.   PAIN: Yes 2.5/10 low back and right hip, lateral R knee.   OBJECTIVE   TODAY'S TREATMENT:  Therapeutic exercise: to centralize symptoms and improve ROM, strength, muscular endurance, and activity tolerance required for successful completion of functional activities.   Nu-Step L2 for 5 minutes for LE warm up. Unbilled.   Prone press ups: x10  Side lying R hip abduction: 1x10  Seated:   Alternating marches with abdominal bracing: x10/LE  Trunk twists holding 3 Kg med ball with ipsilateral hip flexion. 3x10.   Ankle DF AROM: 3x15   LAQ: 2x10, 10# AW      Manual therapy: to reduce pain and tissue tension, improve range of motion, neuromodulation, in order to promote improved ability to complete functional activities.  SEATED   R tibiofemoral joint distraction with PROM knee flex/ext. 1x10  R tibiofemoral joint IR mobilization, grade III-IV, x10 bouts   R tibiofemoral joint distraction: x30 seconds    PRONE  - CPA and R UPA grade III-IV lower thoracic and lumbar segments focusing on tender segments. X10-15 sec bouts/segments, 3 reps L5-T12  Pt required multimodal cuing for proper technique and to acilitate improved neuromuscular control, strength, range of motion, and functional ability resulting in improved performance and form. Requires CGA - SBA for balance exercises.   PATIENT EDUCATION:  Education details: form/technique with exercise. Person educated:  Patient Education method: Explanation, demonstration, verbal cuing, tactile cuing. Education comprehension: verbalized understanding, demonstrated understanding, and needs further education     HOME EXERCISE PROGRAM: Access Code: [email or web address] URL: [email or web address] Date: 12/14/2021 Prepared by: Rosita Kea  Exercises - Supine Lower Trunk Rotation  - 2 x daily - 1 sets - 20 reps - 1-5 seconds hold - Prone Press Up  - 2 x daily - 2 sets - 5 reps - Seated Toe Raise  - 1 x daily - 3 sets - 15 reps - 1 second hold  HOME EXERCISE PROGRAM [[id number]]  Seated marching with weighted twist -  Repeat 10 Times, Hold 1 Second(s), Complete 3 Sets, Perform 1 Times a Day    ASSESSMENT:   CLINICAL IMPRESSION: Continuing PT POC with focus on improving R knee pain and LBP. Pt responds well to manual interventions reporting decreased stiffness and pain. Pt still has some knee pain with hip abduction but tolerates overall standard LE therex well. Pt able to perform ankle DF with marches with cuing to continue improvement in ankle DF for gait improvement and decreased falls risk. Pt will continue to benefit from skilled PT services to address impairments and  functional limitations to maximize functional independence.      From PT Eval 07/10/2021:  Patient is a 46 y.o. female referred to outpatient physical therapy with a medical diagnosis of bilateral leg weakness who presents with signs and symptoms consistent with generalized weakness, balance deficits, and motor control problems likely related to deconditioning and FSHD muscular dystrophy. Patient presents with significant balance, motor control, posture, postural, gait, pain, muscle performance (strength/power/endurance) and activity tolerance impairments that are limiting ability to complete basic mobility and self care including ambulation, stairs, transfers, ADLs, and IADLs without difficulty. At eval requires assistance to navigate stairs,  assistance with ADLs and  IADLs, and ambulates with RW when she previously did not need an assistive device. Patient would likely benefit from bilateral AFOs to address foot drop found at both ankles and improve her steppage gait. Patient will benefit from skilled physical therapy intervention to address current body structure impairments and activity limitations to improve function and work towards goals set in current POC in order to return to prior level of function or maximal functional improvement.   OBJECTIVE IMPAIRMENTS Abnormal gait, decreased activity tolerance, decreased balance, decreased coordination, decreased endurance, decreased mobility, difficulty walking, decreased ROM, decreased strength, impaired perceived functional ability, increased muscle spasms, impaired tone, impaired UE functional use, improper body mechanics, postural dysfunction, and pain.    ACTIVITY LIMITATIONS cleaning, community activity, driving, meal prep, laundry, and shopping.    PERSONAL FACTORS Behavior pattern, Fitness, Past/current experiences, Time since onset of injury/illness/exacerbation, Transportation, and 3+ comorbidities:    muscular dystrophy (FSHD - facioscapulohumeral muscular dystrophy), anxiety and depression, bipolar 1 disorder, borderline personality disorder, diabetes, GERD, gout, pancreatitis, alcoholic liver disease, tobacco abuse,  foot drop, neuropathy, recovering alcoholic, history of drug abuse, history of hernia repair are also affecting patient's functional outcome.      REHAB POTENTIAL: Good   CLINICAL DECISION MAKING: Evolving/moderate complexity   EVALUATION COMPLEXITY: Moderate     GOALS: Goals reviewed with patient? No   SHORT TERM GOALS: Target date: 07/24/2021   Patient will be independent with initial home exercise program for self-management of symptoms. Baseline: Initial HEP to be provided at visit 2 as appropriate (07/10/21); Goal status: Met     LONG TERM GOALS:  Target date: 10/02/2021; updated to 12/27/2021 for all unmet goals on 10/04/2021.    Patient will be independent with a long-term home exercise program for self-management of symptoms.  Baseline: Initial HEP to be provided at visit 2 as appropriate (07/10/21); participating as tolerated, recently too much pain to participate (09/04/2021); participating regularly (10/04/2021); participating as able (11/21/2021);  Goal status: In-Progress   2.  Patient will demonstrate improved FOTO score by 10 points from baseline to demonstrate improvement in overall condition and self-reported functional ability.  Baseline: to be measured visit 2 as appropriate (07/10/21); 46 at visit # 2 (07/17/2021); 50 at visit # 10 (09/04/2021); 53 at visit #15 (10/04/2021); 50 at visit # 20 (11/21/2021);  Goal status: In-Progress   3.  Patient will ambulate equal or greater than 1000 feet during 6 Min Walk Test using [facility] or less restrictive assistive device to improve community ambulation.  Baseline: To be tested visit 2 as appropriate (07/10/21); 310 feet with RW (07/17/2021); 556 feet with rollator (09/04/2021); 600 feet with rollator (10/04/2021); 300 feet with rollator - had to stop test at 3 min due to intolerable right glute pain (11/21/2021);  Goal status: In-Progress   4.  Patient will complete 5 Time Sit To Stand test  in equal or less than 20 seconds with no UE support from 18.5 inch plinth to demonstrate improved LE functional strength and power for basic mobility and improved fall risk.  Baseline: 24 seconds with B UE on 18.5 inch plinth.  (07/10/21); 20 seconds with B UE on thighs from 18.5 inch plinth (09/04/2021); 19 seconds with B UE on thighs from 18.5 inch plinth (10/04/2021); 22 seconds with B UE on thighs from 18.5 inch plinth (11/21/2021);  Goal status: In-Progress   5.  Patient be able to ascend and descend stairs at her home mod I with use of B handrails.  Baseline: able to ascend/descend 4 steps in the clinic with B UE  support on handrails and supervision - reports currently needs assistance to descend steps at home. (07/10/21); able to ascend/descend 4 steps in the clinic with B UE support on handrails and supervision, leading up and down with left. able to perform with reversed LE leads (09/04/2021); able to ascend/descend 4 steps in the clinic with B UE support on handrails and supervision, step over step with heavy UE use on the way down (10/04/2021); able to ascend/descend 4 steps in the clinic with B UE support on handrails and supervision, step over step up and step to leading with R LE with heavy UE use on the way down (11/21/2021); Goal status: In-Progress   6.  Patient will demonstrate ability to perform static balance in tandem stance for equal or greater than 30 seconds on each side, to demonstrate improved balance and decreased fall risk.  Baseline: right foot front 10 seconds, left foot front 7 seconds (07/10/2021); right foot front 4 seconds, left foot front 6 seconds (09/04/2021); right foot front 9 seconds, left foot front 31 seconds (10/04/2021); right foot front 6.5 seconds, left foot front 8.5 seconds (11/21/2021);  Goal status: In-Progress     PLAN: PT FREQUENCY: 2x/week   PT DURATION: 12 weeks   PLANNED INTERVENTIONS: Therapeutic exercises, Therapeutic activity, Neuromuscular re-education, Balance training, Gait training, Patient/Family education, Joint mobilization, Orthotic/Fit training, DME instructions, Dry Needling, Electrical stimulation, Cryotherapy, Moist heat, Splintting, Taping, and Manual therapy   PLAN FOR NEXT SESSION: continue with moderate intensity functional strengthening and balance exercises as able, address back and right glute pain to allow return to functional strengthening and balance with better tolerance.      Salem Caster. Fairly IV, PT, DPT Physical Therapist- [facility]  12/18/21, 11:22 AM

## 2021-12-20 ENCOUNTER — Ambulatory Visit: Payer: Medicare Other | Admitting: Physical Therapy

## 2021-12-25 ENCOUNTER — Encounter: Payer: Medicare Other | Admitting: Physical Therapy

## 2021-12-27 ENCOUNTER — Ambulatory Visit: Payer: Medicare Other | Admitting: Physical Therapy

## 2022-01-01 ENCOUNTER — Encounter: Payer: Self-pay | Admitting: Physical Therapy

## 2022-01-01 ENCOUNTER — Ambulatory Visit: Payer: Medicare Other | Admitting: Physical Therapy

## 2022-01-01 DIAGNOSIS — M6281 Muscle weakness (generalized): Secondary | ICD-10-CM

## 2022-01-01 DIAGNOSIS — R2689 Other abnormalities of gait and mobility: Secondary | ICD-10-CM

## 2022-01-01 DIAGNOSIS — Z9181 History of falling: Secondary | ICD-10-CM

## 2022-01-01 NOTE — Therapy (Signed)
OUTPATIENT PHYSICAL THERAPY TREATMENT NOTE / PROGRESS NOTE / RE-CERTIFICATION Dates of reporting: 11/21/2021 to 01/01/2022  Patient Name: [name] MRN: [id number] DOB:12-Sep-1975, 46 y.o., female Today's Date: 01/01/2022  PCP: Janine Limbo, PA-C REFERRING PROVIDER: Janine Limbo, PA-C  END OF SESSION:   PT End of Session - 01/01/22 1406     Visit Number 26    Number of Visits 80    Date for PT Re-Evaluation 03/26/22    Authorization Type [employer] MEDICARE/Medicaid reporting period from 11/21/2021    Progress Note Due on Visit 10    PT Start Time 1352    PT Stop Time 1430    PT Time Calculation (min) 38 min    Equipment Utilized During Treatment Gait belt    Activity Tolerance Patient limited by pain;Patient limited by fatigue    Behavior During Therapy [facility] for tasks assessed/performed                Past Medical History:  Diagnosis Date   Alcohol abuse    Anxiety    Anxiety    Anxiety and depression    Asthma    Bipolar disorder ([place])    Borderline personality disorder ([place])    Depression    Diabetes ([place])    Type 2    Dyslipidemia    Esophageal varices ([name])    Foot drop    right   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[place])    GERD (gastroesophageal reflux disease)    Gout    History of drug abuse ([place])    Insomnia    Muscular dystrophies ([place])    Neuropathy    Pancreatitis    Recovering alcoholic ([place])    Past Surgical History:  Procedure Laterality Date   HERNIA REPAIR     IR FLUORO GUIDE CV LINE RIGHT  12/30/2020   IR REMOVAL TUN CV CATH W/O FL  01/19/2021   IR US GUIDE VASC ACCESS RIGHT  12/30/2020   Patient Active Problem List   Diagnosis Date Noted   Frequent falls 02/12/2021   Leukocytosis    Aspiration pneumonia of both lower lobes due to gastric secretions ([place]) 01/04/2021   Pressure injury of skin 12/24/2020   Acute hepatic encephalopathy ([place]) 12/20/2020   Sepsis ([place]) 12/19/2020   Acute hyponatremia 12/18/2020   Alcohol use  disorder, moderate, dependence ([place]) 12/18/2020   Hepatic steatosis 12/18/2020   Polysubstance abuse ([place]) 12/18/2020   Hyperglycemia due to type 2 diabetes mellitus ([place]) 12/18/2020   Hyperbilirubinemia 12/18/2020   Lactic acidosis 64/33/2951   Alcoholic liver disease ([place]) 12/18/2020   Bipolar 1 disorder, manic, moderate ([place]) 09/10/2018   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   Diabetes ([place]) 09/10/2018   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[place]) 03/28/2018   Chronic gout of foot 09/29/2015   Muscular dystrophy ([place]) 09/29/2015   Type 2 diabetes mellitus without complication ([place]) 88/41/6606   Anxiety and depression 10/01/2012   Protein-calorie malnutrition, severe ([place]) 09/30/2012   Alcohol abuse 09/26/2012   Trichomonas infection 09/26/2012   Dehydration 09/26/2012   Hypokalemia 11/14/2011   Thrombocytopenia ([place]) 11/14/2011   Alcohol withdrawal ([place]) 11/13/2011   Bipolar 2 disorder ([place]) 11/13/2011   Tobacco abuse 11/13/2011   Cocaine abuse ([place]) 11/13/2011   Gout 11/13/2011     REFERRING DIAG: bilateral leg weakness  THERAPY DIAG:  Other abnormalities of gait and mobility  Muscle weakness (generalized)  History of falling  Rationale for Evaluation and Treatment Rehabilitation  PERTINENT HISTORY: Patient is a 46 y.o. female  who presents to outpatient physical therapy with a referral for medical diagnosis bilateral leg weakness. This patient's chief complaints consist of difficulty with weakness and balance with history of frequent falling leading to the following functional deficits: difficulty with basic mobility including ambulation, stairs, transfers; difficulty with ADLs, IADLs. Currently requires assistance to navigate stairs, assistance with ADLs and  IADLs, and ambulates with RW when she previously did not need an assistive device. Relevant past medical history and comorbidities include muscular dystrophy (FSHD - facioscapulohumeral muscular dystrophy),  anxiety and depression, bipolar 1 disorder, borderline personality disorder, diabetes, GERD, gout, pancreatitis, alcoholic liver disease, tobacco abuse,  foot drop, neuropathy, recovering alcoholic, history of drug abuse, history of hernia repair.  Patient denies hx of cancer, stroke, seizures, lung problems, heart problems, unexplained weight loss, unexplained changes in bowel or bladder problems, unexplained stumbling or dropping things, osteoporosis, and spinal surgery  PRECAUTIONS: Fall, not drink alcohol.  SUBJECTIVE: Patient arrives with rollator. She rates her overall pain 4/10. Mostly her right knee is bothering her at the lateral aspect. She continues to have some pain in the right hip/glute and SIJ region. She has been getting more pain in her left glute region as well. She states the right glute/hip region has not been hurting as much, which allows her to do more things and stay up on her feet more. . She has not been doing HEP recently. No falls lately. She states she has had but one truly bad day in some time. She has an appointment on 01/15/22 where she is expecting to speak with her doctor about her right knee pain and difficulty with right hand.   PAIN: Yes 4/10 low back and right hip, lateral R knee.   OBJECTIVE   SELF-REPORTED FUNCTION FOTO score: 52/100 (NOC-Neuromuscular disorder questionnaire)   FUNCTIONAL/BALANCE TESTS: 6 Minute Walk Test: 420 feet with rollator and steppage gait, R > L. Stopped at 5 min due to feeling of instability in low back to mid thoracic spine.    Five Time Sit to Stand (5TSTS): 20 seconds with B UE on thighs from 18.5 inch plinth.    Static balance (best of 3 trials): tandem stance, eyes open: right foot front 8 seconds, left foot front 7 seconds.    Stairs: able to ascend/descend 4 steps in the clinic with B UE support on handrails and supervision, step over step up and down with heavy UE use on the way down.     TODAY'S TREATMENT:  Therapeutic  exercise: to centralize symptoms and improve ROM, strength, muscular endurance, and activity tolerance required for successful completion of functional activities.  - measurements to assess progress (see above).  - seated LAQ, 3x10 each side with 10# AW.    Manual therapy: to reduce pain and tissue tension, improve range of motion, neuromodulation, in order to promote improved ability to complete functional activities. SEATED - R tibofemoral distraction with PROM from approx 0-90 degrees flexion, 1x10.   Pt required multimodal cuing for proper technique and to acilitate improved neuromuscular control, strength, range of motion, and functional ability resulting in improved performance and form. Requires CGA - SBA for balance exercises.   PATIENT EDUCATION:  Education details: form/technique with exercise. Person educated: Patient Education method: Explanation, demonstration, verbal cuing, tactile cuing. Education comprehension: verbalized understanding, demonstrated understanding, and needs further education     HOME EXERCISE PROGRAM: Access Code: [email or web address] URL: [email or web address] Date: 12/14/2021 Prepared by: Rosita Kea  Exercises - Supine Lower Trunk Rotation  - 2 x  daily - 1 sets - 20 reps - 1-5 seconds hold - Prone Press Up  - 2 x daily - 2 sets - 5 reps - Seated Toe Raise  - 1 x daily - 3 sets - 15 reps - 1 second hold  HOME EXERCISE PROGRAM [[id number]]  Seated marching with weighted twist -  Repeat 10 Times, Hold 1 Second(s), Complete 3 Sets, Perform 1 Times a Day    ASSESSMENT:   CLINICAL IMPRESSION: Patient has attended 26 physical therapy sessions since starting this episode of care on 07/10/2021. She has had trouble with attendance lately due to difficulty with transportation and illness. Since last progress note she has improved in her 6 Minute Walk Test, 5 Time Sit To Stand Test, and FOTO score. She was quite limited last progress note due to increased pain  after a fall. She continues to have pain in her lateral R knee and is waiting to see her doctor about this. She also continues to be limited by low back and glute region pain R > L. Since last PT session, interventions have focused more on pain relief to improve functional activity tolerance. This has improved her function but she continues to be limited by weakness, imbalance,e and lack of coordination as well as pain. She has a degenerative condition and continued PT is necessary to prevent or delay decline in function and loss of functional independence. Patient would benefit from continued management of limiting condition by skilled physical therapist to address remaining impairments and functional limitations to work towards stated goals and return to PLOF or maximal functional independence.     From PT Eval 07/10/2021:  Patient is a 46 y.o. female referred to outpatient physical therapy with a medical diagnosis of bilateral leg weakness who presents with signs and symptoms consistent with generalized weakness, balance deficits, and motor control problems likely related to deconditioning and FSHD muscular dystrophy. Patient presents with significant balance, motor control, posture, postural, gait, pain, muscle performance (strength/power/endurance) and activity tolerance impairments that are limiting ability to complete basic mobility and self care including ambulation, stairs, transfers, ADLs, and IADLs without difficulty. At eval requires assistance to navigate stairs, assistance with ADLs and  IADLs, and ambulates with RW when she previously did not need an assistive device. Patient would likely benefit from bilateral AFOs to address foot drop found at both ankles and improve her steppage gait. Patient will benefit from skilled physical therapy intervention to address current body structure impairments and activity limitations to improve function and work towards goals set in current POC in order to  return to prior level of function or maximal functional improvement.   OBJECTIVE IMPAIRMENTS Abnormal gait, decreased activity tolerance, decreased balance, decreased coordination, decreased endurance, decreased mobility, difficulty walking, decreased ROM, decreased strength, impaired perceived functional ability, increased muscle spasms, impaired tone, impaired UE functional use, improper body mechanics, postural dysfunction, and pain.    ACTIVITY LIMITATIONS cleaning, community activity, driving, meal prep, laundry, and shopping.    PERSONAL FACTORS Behavior pattern, Fitness, Past/current experiences, Time since onset of injury/illness/exacerbation, Transportation, and 3+ comorbidities:    muscular dystrophy (FSHD - facioscapulohumeral muscular dystrophy), anxiety and depression, bipolar 1 disorder, borderline personality disorder, diabetes, GERD, gout, pancreatitis, alcoholic liver disease, tobacco abuse,  foot drop, neuropathy, recovering alcoholic, history of drug abuse, history of hernia repair are also affecting patient's functional outcome.      REHAB POTENTIAL: Good   CLINICAL DECISION MAKING: Evolving/moderate complexity   EVALUATION COMPLEXITY: Moderate  GOALS: Goals reviewed with patient? No   SHORT TERM GOALS: Target date: 07/24/2021   Patient will be independent with initial home exercise program for self-management of symptoms. Baseline: Initial HEP to be provided at visit 2 as appropriate (07/10/21); Goal status: Met     LONG TERM GOALS: Target date: 10/02/2021; updated to 12/27/2021 for all unmet goals on 10/04/2021; updated to 03/26/2022 for all unmet goals on 01/01/2022;   Patient will be independent with a long-term home exercise program for self-management of symptoms.  Baseline: Initial HEP to be provided at visit 2 as appropriate (07/10/21); participating as tolerated, recently too much pain to participate (09/04/2021); participating regularly (10/04/2021);  participating as able (11/21/2021); not participating regularly over last couple of weeks (01/01/2022);  Goal status: Ongoing   2.  Patient will demonstrate improved FOTO score by 10 points from baseline to demonstrate improvement in overall condition and self-reported functional ability.  Baseline: to be measured visit 2 as appropriate (07/10/21); 46 at visit # 2 (07/17/2021); 50 at visit # 10 (09/04/2021); 53 at visit #15 (10/04/2021); 50 at visit # 20 (11/21/2021); 52 at visit # 26 (01/01/2022);  Goal status: In-Progress   3.  Patient will ambulate equal or greater than 1000 feet during 6 Min Walk Test using [facility] or less restrictive assistive device to improve community ambulation.  Baseline: To be tested visit 2 as appropriate (07/10/21); 310 feet with RW (07/17/2021); 556 feet with rollator (09/04/2021); 600 feet with rollator (10/04/2021); 300 feet with rollator - had to stop test at 3 min due to intolerable right glute pain (11/21/2021); 420 feet with rollator and steppage gait, R > L. Stopped at 5 min due to feeling of instability in low back to mid thoracic spine (01/01/2022);  Goal status: In-Progress   4.  Patient will complete 5 Time Sit To Stand test in equal or less than 20 seconds with no UE support from 18.5 inch plinth to demonstrate improved LE functional strength and power for basic mobility and improved fall risk.  Baseline: 24 seconds with B UE on 18.5 inch plinth.  (07/10/21); 20 seconds with B UE on thighs from 18.5 inch plinth (09/04/2021); 19 seconds with B UE on thighs from 18.5 inch plinth (10/04/2021); 22 seconds with B UE on thighs from 18.5 inch plinth (11/21/2021); 20 seconds with B UE on thighs from 18.5 inch plinth (01/01/2022);  Goal status: In-Progress   5.  Patient be able to ascend and descend stairs at her home mod I with use of B handrails.  Baseline: able to ascend/descend 4 steps in the clinic with B UE support on handrails and supervision - reports currently needs assistance to  descend steps at home. (07/10/21); able to ascend/descend 4 steps in the clinic with B UE support on handrails and supervision, leading up and down with left. able to perform with reversed LE leads (09/04/2021); able to ascend/descend 4 steps in the clinic with B UE support on handrails and supervision, step over step with heavy UE use on the way down (10/04/2021); able to ascend/descend 4 steps in the clinic with B UE support on handrails and supervision, step over step up and step to leading with R LE with heavy UE use on the way down (11/21/2021); ascend/descends 4 steps in the clinic with B UE support on handrails and supervision, step over step up and down with heavy UE use on the way down (01/01/2022);  Goal status: In-Progress   6.  Patient will demonstrate ability to  perform static balance in tandem stance for equal or greater than 30 seconds on each side, to demonstrate improved balance and decreased fall risk.  Baseline: right foot front 10 seconds, left foot front 7 seconds (07/10/2021); right foot front 4 seconds, left foot front 6 seconds (09/04/2021); right foot front 9 seconds, left foot front 31 seconds (10/04/2021); right foot front 6.5 seconds, left foot front 8.5 seconds (11/21/2021); right foot front 8 seconds, left foot front 7 seconds (01/01/2022);  Goal status: In-Progress     PLAN: PT FREQUENCY: 2x/week   PT DURATION: 12 weeks   PLANNED INTERVENTIONS: Therapeutic exercises, Therapeutic activity, Neuromuscular re-education, Balance training, Gait training, Patient/Family education, Joint mobilization, Orthotic/Fit training, DME instructions, Dry Needling, Electrical stimulation, Cryotherapy, Moist heat, Splintting, Taping, and Manual therapy   PLAN FOR NEXT SESSION: continue with moderate intensity functional strengthening and balance exercises as able, address back and right glute pain to allow return to functional strengthening and balance with better tolerance.      Everlean Alstrom.  Graylon Good, PT, DPT 01/01/22, 2:52 PM  [facility] [address] [place], [place] [address] P: [phone] I F: [phone]

## 2022-01-03 ENCOUNTER — Ambulatory Visit: Payer: Medicare Other | Admitting: Physical Therapy

## 2022-01-08 ENCOUNTER — Encounter: Payer: Medicare Other | Admitting: Physical Therapy

## 2022-01-11 ENCOUNTER — Encounter: Payer: Medicare Other | Admitting: Physical Therapy

## 2022-01-15 ENCOUNTER — Ambulatory Visit: Payer: Medicare Other | Admitting: Physical Therapy

## 2022-01-23 ENCOUNTER — Telehealth: Payer: Self-pay | Admitting: Physical Therapy

## 2022-01-23 ENCOUNTER — Ambulatory Visit: Payer: Medicare Other | Admitting: Physical Therapy

## 2022-01-23 NOTE — Telephone Encounter (Signed)
Left Message - Called patient whe she did not come to her PT appointment scheduled at 2:30pm today. Left VM notifying patient of the missed visit, her next scheduled visit, and requesting she call us if unable to make it.   Everlean Alstrom. Graylon Good, PT, DPT 01/23/22, 2:51 PM  [facility] [address] [place], [place] [address] P: [phone] I F: [phone]

## 2022-01-25 ENCOUNTER — Ambulatory Visit: Payer: Medicare Other | Attending: Internal Medicine | Admitting: Physical Therapy

## 2022-01-25 ENCOUNTER — Encounter: Payer: Self-pay | Admitting: Physical Therapy

## 2022-01-25 DIAGNOSIS — R2689 Other abnormalities of gait and mobility: Secondary | ICD-10-CM | POA: Diagnosis not present

## 2022-01-25 DIAGNOSIS — Z9181 History of falling: Secondary | ICD-10-CM | POA: Diagnosis present

## 2022-01-25 DIAGNOSIS — M6281 Muscle weakness (generalized): Secondary | ICD-10-CM | POA: Insufficient documentation

## 2022-01-25 NOTE — Therapy (Signed)
OUTPATIENT PHYSICAL THERAPY TREATMENT NOTE / PROGRESS NOTE / RE-CERTIFICATION Dates of reporting: 11/21/2021 to 01/01/2022  Patient Name: [name] MRN: [id number] DOB:05-11-1975, 46 y.o., female Today's Date: 01/25/2022  PCP: Janine Limbo, PA-C REFERRING PROVIDER: Janine Limbo, PA-C  END OF SESSION:   PT End of Session - 01/25/22 0942     Visit Number 27    Number of Visits 59    Date for PT Re-Evaluation 03/26/22    Authorization Type [employer] MEDICARE/Medicaid reporting period from 11/21/2021    Progress Note Due on Visit 10    PT Start Time 0906    PT Stop Time 0944    PT Time Calculation (min) 38 min    Equipment Utilized During Treatment Gait belt    Activity Tolerance Patient limited by pain;Patient limited by fatigue    Behavior During Therapy [facility] for tasks assessed/performed                 Past Medical History:  Diagnosis Date   Alcohol abuse    Anxiety    Anxiety    Anxiety and depression    Asthma    Bipolar disorder ([place])    Borderline personality disorder ([place])    Depression    Diabetes ([place])    Type 2    Dyslipidemia    Esophageal varices ([name])    Foot drop    right   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[place])    GERD (gastroesophageal reflux disease)    Gout    History of drug abuse ([place])    Insomnia    Muscular dystrophies ([place])    Neuropathy    Pancreatitis    Recovering alcoholic ([place])    Past Surgical History:  Procedure Laterality Date   HERNIA REPAIR     IR FLUORO GUIDE CV LINE RIGHT  12/30/2020   IR REMOVAL TUN CV CATH W/O FL  01/19/2021   IR US GUIDE VASC ACCESS RIGHT  12/30/2020   Patient Active Problem List   Diagnosis Date Noted   Frequent falls 02/12/2021   Leukocytosis    Aspiration pneumonia of both lower lobes due to gastric secretions ([place]) 01/04/2021   Pressure injury of skin 12/24/2020   Acute hepatic encephalopathy ([place]) 12/20/2020   Sepsis ([place]) 12/19/2020   Acute hyponatremia 12/18/2020   Alcohol use  disorder, moderate, dependence ([place]) 12/18/2020   Hepatic steatosis 12/18/2020   Polysubstance abuse ([place]) 12/18/2020   Hyperglycemia due to type 2 diabetes mellitus ([place]) 12/18/2020   Hyperbilirubinemia 12/18/2020   Lactic acidosis 83/41/9622   Alcoholic liver disease ([place]) 12/18/2020   Bipolar 1 disorder, manic, moderate ([place]) 09/10/2018   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   Diabetes ([place]) 09/10/2018   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[place]) 03/28/2018   Chronic gout of foot 09/29/2015   Muscular dystrophy ([place]) 09/29/2015   Type 2 diabetes mellitus without complication ([place]) 29/79/8921   Anxiety and depression 10/01/2012   Protein-calorie malnutrition, severe ([place]) 09/30/2012   Alcohol abuse 09/26/2012   Trichomonas infection 09/26/2012   Dehydration 09/26/2012   Hypokalemia 11/14/2011   Thrombocytopenia ([place]) 11/14/2011   Alcohol withdrawal ([place]) 11/13/2011   Bipolar 2 disorder ([place]) 11/13/2011   Tobacco abuse 11/13/2011   Cocaine abuse ([place]) 11/13/2011   Gout 11/13/2011     REFERRING DIAG: bilateral leg weakness  THERAPY DIAG:  Other abnormalities of gait and mobility  Muscle weakness (generalized)  History of falling  Rationale for Evaluation and Treatment Rehabilitation  PERTINENT HISTORY: Patient is a 46 y.o.  female who presents to outpatient physical therapy with a referral for medical diagnosis bilateral leg weakness. This patient's chief complaints consist of difficulty with weakness and balance with history of frequent falling leading to the following functional deficits: difficulty with basic mobility including ambulation, stairs, transfers; difficulty with ADLs, IADLs. Currently requires assistance to navigate stairs, assistance with ADLs and  IADLs, and ambulates with RW when she previously did not need an assistive device. Relevant past medical history and comorbidities include muscular dystrophy (FSHD - facioscapulohumeral muscular dystrophy),  anxiety and depression, bipolar 1 disorder, borderline personality disorder, diabetes, GERD, gout, pancreatitis, alcoholic liver disease, tobacco abuse,  foot drop, neuropathy, recovering alcoholic, history of drug abuse, history of hernia repair.  Patient denies hx of cancer, stroke, seizures, lung problems, heart problems, unexplained weight loss, unexplained changes in bowel or bladder problems, unexplained stumbling or dropping things, osteoporosis, and spinal surgery  PRECAUTIONS: Fall, not drink alcohol.  SUBJECTIVE: Patient arrives with rollator. She states her R knee has been doing better and so had her back until Monday when she did a lot of sitting. She states her cousin was in the hospital for 10 days and patient and her cousin has been going to a lot of appointments that they missed. Patient depends on her cousin for transportation currently. Patient's cousin is in need of a living liver donor and patient is considering finding out if she can donate. She was told she needs to lose some weight to be able to donate.  [facility] today. She states she got her COVID19 shot yesterday. She states she fell while her cousin was in the hospital. She was able to get up by putting a cushion under he knees and pulling up on the table. She thinks the fall may have caused her low back to hurt more again.   PAIN: NPRS 4/10 low back and right hip, 1/10 lateral R knee.   OBJECTIVE   TODAY'S TREATMENT:  Therapeutic exercise: to centralize symptoms and improve ROM, strength, muscular endurance, and activity tolerance required for successful completion of functional activities.  - NuStep level 5? using bilateral upper and lower extremities. Seat/handle setting 9/10. For improved extremity mobility, muscular endurance, and activity tolerance; and to induce the analgesic effect of aerobic exercise, stimulate improved joint nutrition, and prepare body structures and systems for  following interventions. x 5  minutes. Average SPM = 82.  (Manual therapy - see below).  - prone press up 1x10 - prone press up with hands on yoga blocks, 1x4 (patient reports sudden pulling at scar at left lower abdomen where she had a hernia repair, discontinued due to continued pain there).  - prone press up with no blocks, 1x4 (discontinued due to ongoing pain at left lower abdomen).  - seated LAQ, 2x10 each side with 10# AW.    Manual therapy: to reduce pain and tissue tension, improve range of motion, neuromodulation, in order to promote improved ability to complete functional activities. PRONE - STM to B posterior lumbar paraspinals and right glute.  - CPA and R UPA grade III-IV lower thoracic and lumbar segments focusing on tender segments.   Pt required multimodal cuing for proper technique and to acilitate improved neuromuscular control, strength, range of motion, and functional ability resulting in improved performance and form. Requires CGA - SBA for balance exercises.   PATIENT EDUCATION:  Education details: form/technique with exercise. Person educated: Patient Education method: Explanation, demonstration, verbal cuing, tactile cuing. Education comprehension:  verbalized understanding, demonstrated understanding, and needs further education     HOME EXERCISE PROGRAM: Access Code: [email or web address] URL: [email or web address] Date: 12/14/2021 Prepared by: Rosita Kea  Exercises - Supine Lower Trunk Rotation  - 2 x daily - 1 sets - 20 reps - 1-5 seconds hold - Prone Press Up  - 2 x daily - 2 sets - 5 reps - Seated Toe Raise  - 1 x daily - 3 sets - 15 reps - 1 second hold  HOME EXERCISE PROGRAM [[id number]]  Seated marching with weighted twist -  Repeat 10 Times, Hold 1 Second(s), Complete 3 Sets, Perform 1 Times a Day    ASSESSMENT:   CLINICAL IMPRESSION: Patient is returning to PT after her last appointment was 01/01/2022. She has not been able to attend due to  transportation and family health issues. She returns with improved knee pain and continued low back and glute pain and difficulty with balance and mobility. Today's session focused on pain reduction techniques and introducing LE strengthening again. Patient tolerated interventions well overall but was limited in prone press up due to left lower abdominal pain at site of prior hernia surgery (when patient was 46 years old). Plan to continue with strengthening and balance activities as tolerated with interventions for pain control as needed. Patient would benefit from continued management of limiting condition by skilled physical therapist to address remaining impairments and functional limitations to work towards stated goals and return to PLOF or maximal functional independence.    From PT Eval 07/10/2021:  Patient is a 46 y.o. female referred to outpatient physical therapy with a medical diagnosis of bilateral leg weakness who presents with signs and symptoms consistent with generalized weakness, balance deficits, and motor control problems likely related to deconditioning and FSHD muscular dystrophy. Patient presents with significant balance, motor control, posture, postural, gait, pain, muscle performance (strength/power/endurance) and activity tolerance impairments that are limiting ability to complete basic mobility and self care including ambulation, stairs, transfers, ADLs, and IADLs without difficulty. At eval requires assistance to navigate stairs, assistance with ADLs and  IADLs, and ambulates with RW when she previously did not need an assistive device. Patient would likely benefit from bilateral AFOs to address foot drop found at both ankles and improve her steppage gait. Patient will benefit from skilled physical therapy intervention to address current body structure impairments and activity limitations to improve function and work towards goals set in current POC in order to return to prior level of  function or maximal functional improvement.   OBJECTIVE IMPAIRMENTS Abnormal gait, decreased activity tolerance, decreased balance, decreased coordination, decreased endurance, decreased mobility, difficulty walking, decreased ROM, decreased strength, impaired perceived functional ability, increased muscle spasms, impaired tone, impaired UE functional use, improper body mechanics, postural dysfunction, and pain.    ACTIVITY LIMITATIONS cleaning, community activity, driving, meal prep, laundry, and shopping.    PERSONAL FACTORS Behavior pattern, Fitness, Past/current experiences, Time since onset of injury/illness/exacerbation, Transportation, and 3+ comorbidities:    muscular dystrophy (FSHD - facioscapulohumeral muscular dystrophy), anxiety and depression, bipolar 1 disorder, borderline personality disorder, diabetes, GERD, gout, pancreatitis, alcoholic liver disease, tobacco abuse,  foot drop, neuropathy, recovering alcoholic, history of drug abuse, history of hernia repair are also affecting patient's functional outcome.      REHAB POTENTIAL: Good   CLINICAL DECISION MAKING: Evolving/moderate complexity   EVALUATION COMPLEXITY: Moderate     GOALS: Goals reviewed with patient? No   SHORT TERM GOALS: Target date: 07/24/2021   Patient will be independent with  initial home exercise program for self-management of symptoms. Baseline: Initial HEP to be provided at visit 2 as appropriate (07/10/21); Goal status: Met     LONG TERM GOALS: Target date: 10/02/2021; updated to 12/27/2021 for all unmet goals on 10/04/2021; updated to 03/26/2022 for all unmet goals on 01/01/2022;   Patient will be independent with a long-term home exercise program for self-management of symptoms.  Baseline: Initial HEP to be provided at visit 2 as appropriate (07/10/21); participating as tolerated, recently too much pain to participate (09/04/2021); participating regularly (10/04/2021); participating as able (11/21/2021); not  participating regularly over last couple of weeks (01/01/2022);  Goal status: Ongoing   2.  Patient will demonstrate improved FOTO score by 10 points from baseline to demonstrate improvement in overall condition and self-reported functional ability.  Baseline: to be measured visit 2 as appropriate (07/10/21); 46 at visit # 2 (07/17/2021); 50 at visit # 10 (09/04/2021); 53 at visit #15 (10/04/2021); 50 at visit # 20 (11/21/2021); 52 at visit # 26 (01/01/2022);  Goal status: In-Progress   3.  Patient will ambulate equal or greater than 1000 feet during 6 Min Walk Test using [facility] or less restrictive assistive device to improve community ambulation.  Baseline: To be tested visit 2 as appropriate (07/10/21); 310 feet with RW (07/17/2021); 556 feet with rollator (09/04/2021); 600 feet with rollator (10/04/2021); 300 feet with rollator - had to stop test at 3 min due to intolerable right glute pain (11/21/2021); 420 feet with rollator and steppage gait, R > L. Stopped at 5 min due to feeling of instability in low back to mid thoracic spine (01/01/2022);  Goal status: In-Progress   4.  Patient will complete 5 Time Sit To Stand test in equal or less than 20 seconds with no UE support from 18.5 inch plinth to demonstrate improved LE functional strength and power for basic mobility and improved fall risk.  Baseline: 24 seconds with B UE on 18.5 inch plinth.  (07/10/21); 20 seconds with B UE on thighs from 18.5 inch plinth (09/04/2021); 19 seconds with B UE on thighs from 18.5 inch plinth (10/04/2021); 22 seconds with B UE on thighs from 18.5 inch plinth (11/21/2021); 20 seconds with B UE on thighs from 18.5 inch plinth (01/01/2022);  Goal status: In-Progress   5.  Patient be able to ascend and descend stairs at her home mod I with use of B handrails.  Baseline: able to ascend/descend 4 steps in the clinic with B UE support on handrails and supervision - reports currently needs assistance to descend steps at home. (07/10/21);  able to ascend/descend 4 steps in the clinic with B UE support on handrails and supervision, leading up and down with left. able to perform with reversed LE leads (09/04/2021); able to ascend/descend 4 steps in the clinic with B UE support on handrails and supervision, step over step with heavy UE use on the way down (10/04/2021); able to ascend/descend 4 steps in the clinic with B UE support on handrails and supervision, step over step up and step to leading with R LE with heavy UE use on the way down (11/21/2021); ascend/descends 4 steps in the clinic with B UE support on handrails and supervision, step over step up and down with heavy UE use on the way down (01/01/2022);  Goal status: In-Progress   6.  Patient will demonstrate ability to perform static balance in tandem stance for equal or greater than 30 seconds on each side, to demonstrate improved balance and  decreased fall risk.  Baseline: right foot front 10 seconds, left foot front 7 seconds (07/10/2021); right foot front 4 seconds, left foot front 6 seconds (09/04/2021); right foot front 9 seconds, left foot front 31 seconds (10/04/2021); right foot front 6.5 seconds, left foot front 8.5 seconds (11/21/2021); right foot front 8 seconds, left foot front 7 seconds (01/01/2022);  Goal status: In-Progress     PLAN: PT FREQUENCY: 2x/week   PT DURATION: 12 weeks   PLANNED INTERVENTIONS: Therapeutic exercises, Therapeutic activity, Neuromuscular re-education, Balance training, Gait training, Patient/Family education, Joint mobilization, Orthotic/Fit training, DME instructions, Dry Needling, Electrical stimulation, Cryotherapy, Moist heat, Splintting, Taping, and Manual therapy   PLAN FOR NEXT SESSION: continue with moderate intensity functional strengthening and balance exercises as able, address back and right glute pain to allow return to functional strengthening and balance with better tolerance.      Everlean Alstrom. Graylon Good, PT, DPT 01/25/22, 10:02  AM  [facility] [address] [place], [place] [address] P: [phone] I F: [phone]

## 2022-02-01 ENCOUNTER — Ambulatory Visit: Payer: Medicare Other | Admitting: Physical Therapy

## 2022-02-01 ENCOUNTER — Encounter: Payer: Self-pay | Admitting: Physical Therapy

## 2022-02-01 DIAGNOSIS — R2689 Other abnormalities of gait and mobility: Secondary | ICD-10-CM

## 2022-02-01 DIAGNOSIS — Z9181 History of falling: Secondary | ICD-10-CM

## 2022-02-01 DIAGNOSIS — M6281 Muscle weakness (generalized): Secondary | ICD-10-CM

## 2022-02-01 NOTE — Therapy (Signed)
OUTPATIENT PHYSICAL THERAPY TREATMENT NOTE  Patient Name: [name] MRN: [id number] DOB:October 21, 1975, 46 y.o., female Today's Date: 02/01/2022  PCP: Janine Limbo, PA-C REFERRING PROVIDER: Janine Limbo, PA-C  END OF SESSION:   PT End of Session - 02/01/22 1803     Visit Number 28    Number of Visits 19    Date for PT Re-Evaluation 03/26/22    Authorization Type [employer] MEDICARE/Medicaid reporting period from 11/21/2021    Progress Note Due on Visit 10    PT Start Time 1815    PT Stop Time 1855    PT Time Calculation (min) 40 min    Equipment Utilized During Treatment Gait belt    Activity Tolerance Patient limited by pain;Patient limited by fatigue    Behavior During Therapy [facility] for tasks assessed/performed             Past Medical History:  Diagnosis Date   Alcohol abuse    Anxiety    Anxiety    Anxiety and depression    Asthma    Bipolar disorder ([place])    Borderline personality disorder ([place])    Depression    Diabetes ([place])    Type 2    Dyslipidemia    Esophageal varices ([name])    Foot drop    right   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[place])    GERD (gastroesophageal reflux disease)    Gout    History of drug abuse ([place])    Insomnia    Muscular dystrophies ([place])    Neuropathy    Pancreatitis    Recovering alcoholic ([place])    Past Surgical History:  Procedure Laterality Date   HERNIA REPAIR     IR FLUORO GUIDE CV LINE RIGHT  12/30/2020   IR REMOVAL TUN CV CATH W/O FL  01/19/2021   IR US GUIDE VASC ACCESS RIGHT  12/30/2020   Patient Active Problem List   Diagnosis Date Noted   Frequent falls 02/12/2021   Leukocytosis    Aspiration pneumonia of both lower lobes due to gastric secretions ([place]) 01/04/2021   Pressure injury of skin 12/24/2020   Acute hepatic encephalopathy ([place]) 12/20/2020   Sepsis ([place]) 12/19/2020   Acute hyponatremia 12/18/2020   Alcohol use disorder, moderate, dependence ([place]) 12/18/2020   Hepatic steatosis 12/18/2020    Polysubstance abuse ([place]) 12/18/2020   Hyperglycemia due to type 2 diabetes mellitus ([place]) 12/18/2020   Hyperbilirubinemia 12/18/2020   Lactic acidosis 44/81/8563   Alcoholic liver disease ([place]) 12/18/2020   Bipolar 1 disorder, manic, moderate ([place]) 09/10/2018   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   Diabetes ([place]) 09/10/2018   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[place]) 03/28/2018   Chronic gout of foot 09/29/2015   Muscular dystrophy ([place]) 09/29/2015   Type 2 diabetes mellitus without complication ([place]) 14/97/0263   Anxiety and depression 10/01/2012   Protein-calorie malnutrition, severe ([place]) 09/30/2012   Alcohol abuse 09/26/2012   Trichomonas infection 09/26/2012   Dehydration 09/26/2012   Hypokalemia 11/14/2011   Thrombocytopenia ([place]) 11/14/2011   Alcohol withdrawal ([place]) 11/13/2011   Bipolar 2 disorder ([place]) 11/13/2011   Tobacco abuse 11/13/2011   Cocaine abuse ([place]) 11/13/2011   Gout 11/13/2011     REFERRING DIAG: bilateral leg weakness  THERAPY DIAG:  Other abnormalities of gait and mobility  Muscle weakness (generalized)  History of falling  Rationale for Evaluation and Treatment Rehabilitation  PERTINENT HISTORY: Patient is a 46 y.o. female who presents to outpatient physical therapy with a referral for medical diagnosis bilateral leg  weakness. This patient's chief complaints consist of difficulty with weakness and balance with history of frequent falling leading to the following functional deficits: difficulty with basic mobility including ambulation, stairs, transfers; difficulty with ADLs, IADLs. Currently requires assistance to navigate stairs, assistance with ADLs and  IADLs, and ambulates with RW when she previously did not need an assistive device. Relevant past medical history and comorbidities include muscular dystrophy (FSHD - facioscapulohumeral muscular dystrophy), anxiety and depression, bipolar 1 disorder, borderline personality disorder, diabetes,  GERD, gout, pancreatitis, alcoholic liver disease, tobacco abuse,  foot drop, neuropathy, recovering alcoholic, history of drug abuse, history of hernia repair.  Patient denies hx of cancer, stroke, seizures, lung problems, heart problems, unexplained weight loss, unexplained changes in bowel or bladder problems, unexplained stumbling or dropping things, osteoporosis, and spinal surgery  PRECAUTIONS: Fall, not drink alcohol.  SUBJECTIVE: Patient arrives with rollator. She states she feels like there is something cylindrical pushing on her right sacral region. Her R knee hurts sometimes but not as bad. She was so tired after last PT session her legs didn't want to work and she fell at home when she caught her right foot on something and "it didn't want to work" when she tried to lift her LE up. She aimed towards a pile of laundry and she does not feel like she hurt herself. She also had pain in her groin. She feels like she needs to come to PT more often to stay strong.   PAIN: NPRS 3/10 low back and right hip, 1/10 lateral R knee.   OBJECTIVE   TODAY'S TREATMENT:  Therapeutic exercise: to centralize symptoms and improve ROM, strength, muscular endurance, and activity tolerance required for successful completion of functional activities.  - NuStep level 3 using bilateral upper and lower extremities. Seat/handle setting 9/10. For improved extremity mobility, muscular endurance, and activity tolerance; and to induce the analgesic effect of aerobic exercise, stimulate improved joint nutrition, and prepare body structures and systems for following interventions. x 4  minutes. Average SPM = 85.   (Manual therapy - see below).   - prone lying with head of bed elevated 40 degrees, 1x3 min to decrease back and hip pain.  - seated LAQ, 2x10 each side with 7.5# AW.  - standing static balance, 3x1 min on airex pad with seated rest break in between. CGA - lateral stepping over 6 inch hurdle with B UE support,  1x8 each direction. SBA. Cuing for step length. Stopped due to right knee pain.  - seated AAROM hamstring curl/knee extension with foot on basketball rolling on floor, 2x10 each side. Cuing for limiting ROM on R to avoid increasing knee pain.    Manual therapy: to reduce pain and tissue tension, improve range of motion, neuromodulation, in order to promote improved ability to complete functional activities. PRONE - STM to B posterior lumbar paraspinals and right glute.  - CPA grade III-IV lower thoracic and lumbar segments focusing on tender segments.   Pt required multimodal cuing for proper technique and to acilitate improved neuromuscular control, strength, range of motion, and functional ability resulting in improved performance and form. Requires CGA - SBA for balance exercises.   PATIENT EDUCATION:  Education details: form/technique with exercise. Person educated: Patient Education method: Explanation, demonstration, verbal cuing, tactile cuing. Education comprehension: verbalized understanding, demonstrated understanding, and needs further education     HOME EXERCISE PROGRAM: Access Code: [email or web address] URL: [email or web address] Date: 12/14/2021 Prepared by: Rosita Kea  Exercises - Supine Lower Trunk Rotation  -  2 x daily - 1 sets - 20 reps - 1-5 seconds hold - Prone Press Up  - 2 x daily - 2 sets - 5 reps - Seated Toe Raise  - 1 x daily - 3 sets - 15 reps - 1 second hold  HOME EXERCISE PROGRAM [[id number]]  Seated marching with weighted twist -  Repeat 10 Times, Hold 1 Second(s), Complete 3 Sets, Perform 1 Times a Day    ASSESSMENT:   CLINICAL IMPRESSION: Patient arrives with report of difficulty with motor control and strength following last PT session. Decreased intensity of exercise some this session with more breaks. Unclear if sustained lumbar extension in lying had any effect on low back or hip pain but it was better tolerated than prone press up. Continued  interventions for pain relief at the back and right hip as well as exercises for functional strength, balance, and coordination. Patient required cuing for exercise selection, volume, and form. Patient would benefit from continued management of limiting condition by skilled physical therapist to address remaining impairments and functional limitations to work towards stated goals and return to PLOF or maximal functional independence.    From PT Eval 07/10/2021:  Patient is a 46 y.o. female referred to outpatient physical therapy with a medical diagnosis of bilateral leg weakness who presents with signs and symptoms consistent with generalized weakness, balance deficits, and motor control problems likely related to deconditioning and FSHD muscular dystrophy. Patient presents with significant balance, motor control, posture, postural, gait, pain, muscle performance (strength/power/endurance) and activity tolerance impairments that are limiting ability to complete basic mobility and self care including ambulation, stairs, transfers, ADLs, and IADLs without difficulty. At eval requires assistance to navigate stairs, assistance with ADLs and  IADLs, and ambulates with RW when she previously did not need an assistive device. Patient would likely benefit from bilateral AFOs to address foot drop found at both ankles and improve her steppage gait. Patient will benefit from skilled physical therapy intervention to address current body structure impairments and activity limitations to improve function and work towards goals set in current POC in order to return to prior level of function or maximal functional improvement.   OBJECTIVE IMPAIRMENTS Abnormal gait, decreased activity tolerance, decreased balance, decreased coordination, decreased endurance, decreased mobility, difficulty walking, decreased ROM, decreased strength, impaired perceived functional ability, increased muscle spasms, impaired tone, impaired UE  functional use, improper body mechanics, postural dysfunction, and pain.    ACTIVITY LIMITATIONS cleaning, community activity, driving, meal prep, laundry, and shopping.    PERSONAL FACTORS Behavior pattern, Fitness, Past/current experiences, Time since onset of injury/illness/exacerbation, Transportation, and 3+ comorbidities:    muscular dystrophy (FSHD - facioscapulohumeral muscular dystrophy), anxiety and depression, bipolar 1 disorder, borderline personality disorder, diabetes, GERD, gout, pancreatitis, alcoholic liver disease, tobacco abuse,  foot drop, neuropathy, recovering alcoholic, history of drug abuse, history of hernia repair are also affecting patient's functional outcome.      REHAB POTENTIAL: Good   CLINICAL DECISION MAKING: Evolving/moderate complexity   EVALUATION COMPLEXITY: Moderate     GOALS: Goals reviewed with patient? No   SHORT TERM GOALS: Target date: 07/24/2021   Patient will be independent with initial home exercise program for self-management of symptoms. Baseline: Initial HEP to be provided at visit 2 as appropriate (07/10/21); Goal status: Met     LONG TERM GOALS: Target date: 10/02/2021; updated to 12/27/2021 for all unmet goals on 10/04/2021; updated to 03/26/2022 for all unmet goals on 01/01/2022;   Patient will be independent with  a long-term home exercise program for self-management of symptoms.  Baseline: Initial HEP to be provided at visit 2 as appropriate (07/10/21); participating as tolerated, recently too much pain to participate (09/04/2021); participating regularly (10/04/2021); participating as able (11/21/2021); not participating regularly over last couple of weeks (01/01/2022);  Goal status: Ongoing   2.  Patient will demonstrate improved FOTO score by 10 points from baseline to demonstrate improvement in overall condition and self-reported functional ability.  Baseline: to be measured visit 2 as appropriate (07/10/21); 46 at visit # 2 (07/17/2021);  50 at visit # 10 (09/04/2021); 53 at visit #15 (10/04/2021); 50 at visit # 20 (11/21/2021); 52 at visit # 26 (01/01/2022);  Goal status: In-Progress   3.  Patient will ambulate equal or greater than 1000 feet during 6 Min Walk Test using [facility] or less restrictive assistive device to improve community ambulation.  Baseline: To be tested visit 2 as appropriate (07/10/21); 310 feet with RW (07/17/2021); 556 feet with rollator (09/04/2021); 600 feet with rollator (10/04/2021); 300 feet with rollator - had to stop test at 3 min due to intolerable right glute pain (11/21/2021); 420 feet with rollator and steppage gait, R > L. Stopped at 5 min due to feeling of instability in low back to mid thoracic spine (01/01/2022);  Goal status: In-Progress   4.  Patient will complete 5 Time Sit To Stand test in equal or less than 20 seconds with no UE support from 18.5 inch plinth to demonstrate improved LE functional strength and power for basic mobility and improved fall risk.  Baseline: 24 seconds with B UE on 18.5 inch plinth.  (07/10/21); 20 seconds with B UE on thighs from 18.5 inch plinth (09/04/2021); 19 seconds with B UE on thighs from 18.5 inch plinth (10/04/2021); 22 seconds with B UE on thighs from 18.5 inch plinth (11/21/2021); 20 seconds with B UE on thighs from 18.5 inch plinth (01/01/2022);  Goal status: In-Progress   5.  Patient be able to ascend and descend stairs at her home mod I with use of B handrails.  Baseline: able to ascend/descend 4 steps in the clinic with B UE support on handrails and supervision - reports currently needs assistance to descend steps at home. (07/10/21); able to ascend/descend 4 steps in the clinic with B UE support on handrails and supervision, leading up and down with left. able to perform with reversed LE leads (09/04/2021); able to ascend/descend 4 steps in the clinic with B UE support on handrails and supervision, step over step with heavy UE use on the way down (10/04/2021); able to  ascend/descend 4 steps in the clinic with B UE support on handrails and supervision, step over step up and step to leading with R LE with heavy UE use on the way down (11/21/2021); ascend/descends 4 steps in the clinic with B UE support on handrails and supervision, step over step up and down with heavy UE use on the way down (01/01/2022);  Goal status: In-Progress   6.  Patient will demonstrate ability to perform static balance in tandem stance for equal or greater than 30 seconds on each side, to demonstrate improved balance and decreased fall risk.  Baseline: right foot front 10 seconds, left foot front 7 seconds (07/10/2021); right foot front 4 seconds, left foot front 6 seconds (09/04/2021); right foot front 9 seconds, left foot front 31 seconds (10/04/2021); right foot front 6.5 seconds, left foot front 8.5 seconds (11/21/2021); right foot front 8 seconds, left foot front 7  seconds (01/01/2022);  Goal status: In-Progress     PLAN: PT FREQUENCY: 2x/week   PT DURATION: 12 weeks   PLANNED INTERVENTIONS: Therapeutic exercises, Therapeutic activity, Neuromuscular re-education, Balance training, Gait training, Patient/Family education, Joint mobilization, Orthotic/Fit training, DME instructions, Dry Needling, Electrical stimulation, Cryotherapy, Moist heat, Splintting, Taping, and Manual therapy   PLAN FOR NEXT SESSION: continue with moderate intensity functional strengthening and balance exercises as able, address back and right glute pain to allow return to functional strengthening and balance with better tolerance.      Everlean Alstrom. Graylon Good, PT, DPT 02/01/22, 7:12 PM  [facility] [address] [place], [place] [address] P: [phone] I F: [phone]

## 2022-02-05 ENCOUNTER — Ambulatory Visit: Payer: Medicare Other | Admitting: Physical Therapy

## 2022-02-07 ENCOUNTER — Ambulatory Visit: Payer: Medicare Other | Admitting: Physical Therapy

## 2022-02-13 ENCOUNTER — Ambulatory Visit: Payer: Medicare Other | Admitting: Physical Therapy

## 2022-02-13 ENCOUNTER — Encounter: Payer: Self-pay | Admitting: Physical Therapy

## 2022-02-13 DIAGNOSIS — M6281 Muscle weakness (generalized): Secondary | ICD-10-CM

## 2022-02-13 DIAGNOSIS — R2689 Other abnormalities of gait and mobility: Secondary | ICD-10-CM

## 2022-02-13 DIAGNOSIS — Z9181 History of falling: Secondary | ICD-10-CM

## 2022-02-13 NOTE — Therapy (Signed)
OUTPATIENT PHYSICAL THERAPY TREATMENT NOTE  Patient Name: [name] MRN: [id number] DOB:[birth year], 46 y.o., female Today's Date: 02/13/2022  PCP: Janine Limbo, PA-C REFERRING PROVIDER: Janine Limbo, PA-C  END OF SESSION:   PT End of Session - 02/13/22 1727     Visit Number 29    Number of Visits 60    Date for PT Re-Evaluation 03/26/22    Authorization Type [employer]HC MEDICARE/Medicaid reporting period from 11/21/2021    Progress Note Due on Visit 10    PT Start Time 1650    PT Stop Time 1728    PT Time Calculation (min) 38 min    Equipment Utilized During Treatment Gait belt    Activity Tolerance Patient limited by pain;Patient limited by fatigue    Behavior During Therapy [facility] for tasks assessed/performed             Past Medical History:  Diagnosis Date   Alcohol abuse    Anxiety    Anxiety    Anxiety and depression    Asthma    Bipolar disorder ([place])    Borderline personality disorder ([place])    Depression    Diabetes ([place])    Type 2    Dyslipidemia    Esophageal varices ([name])    Foot drop    right   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[place])    GERD (gastroesophageal reflux disease)    Gout    History of drug abuse ([place])    Insomnia    Muscular dystrophies ([place])    Neuropathy    Pancreatitis    Recovering alcoholic ([place])    Past Surgical History:  Procedure Laterality Date   HERNIA REPAIR     IR FLUORO GUIDE CV LINE RIGHT  12/30/2020   IR REMOVAL TUN CV CATH W/O FL  01/19/2021   IR US GUIDE VASC ACCESS RIGHT  12/30/2020   Patient Active Problem List   Diagnosis Date Noted   Frequent falls 02/12/2021   Leukocytosis    Aspiration pneumonia of both lower lobes due to gastric secretions ([place]) 01/04/2021   Pressure injury of skin 12/24/2020   Acute hepatic encephalopathy ([place]) 12/20/2020   Sepsis ([place]) 12/19/2020   Acute hyponatremia 12/18/2020   Alcohol use disorder, moderate, dependence ([place]) 12/18/2020   Hepatic steatosis 12/18/2020    Polysubstance abuse ([place]) 12/18/2020   Hyperglycemia due to type 2 diabetes mellitus ([place]) 12/18/2020   Hyperbilirubinemia 12/18/2020   Lactic acidosis 74/25/9563   Alcoholic liver disease ([place]) 12/18/2020   Bipolar 1 disorder, manic, moderate ([place]) 09/10/2018   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   Diabetes ([place]) 09/10/2018   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[place]) 03/28/2018   Chronic gout of foot 09/29/2015   Muscular dystrophy ([place]) 09/29/2015   Type 2 diabetes mellitus without complication ([place]) 87/56/4332   Anxiety and depression 10/01/2012   Protein-calorie malnutrition, severe ([place]) 09/30/2012   Alcohol abuse 09/26/2012   Trichomonas infection 09/26/2012   Dehydration 09/26/2012   Hypokalemia 11/14/2011   Thrombocytopenia ([place]) 11/14/2011   Alcohol withdrawal ([place]) 11/13/2011   Bipolar 2 disorder ([place]) 11/13/2011   Tobacco abuse 11/13/2011   Cocaine abuse ([place]) 11/13/2011   Gout 11/13/2011     REFERRING DIAG: bilateral leg weakness  THERAPY DIAG:  Other abnormalities of gait and mobility  Muscle weakness (generalized)  History of falling  Rationale for Evaluation and Treatment Rehabilitation  PERTINENT HISTORY: Patient is a 46 y.o. female who presents to outpatient physical therapy with a referral for medical diagnosis bilateral leg  weakness. This patient's chief complaints consist of difficulty with weakness and balance with history of frequent falling leading to the following functional deficits: difficulty with basic mobility including ambulation, stairs, transfers; difficulty with ADLs, IADLs. Currently requires assistance to navigate stairs, assistance with ADLs and  IADLs, and ambulates with RW when she previously did not need an assistive device. Relevant past medical history and comorbidities include muscular dystrophy (FSHD - facioscapulohumeral muscular dystrophy), anxiety and depression, bipolar 1 disorder, borderline personality disorder, diabetes,  GERD, gout, pancreatitis, alcoholic liver disease, tobacco abuse,  foot drop, neuropathy, recovering alcoholic, history of drug abuse, history of hernia repair.  Patient denies hx of cancer, stroke, seizures, lung problems, heart problems, unexplained weight loss, unexplained changes in bowel or bladder problems, unexplained stumbling or dropping things, osteoporosis, and spinal surgery  PRECAUTIONS: Fall, not drink alcohol.  SUBJECTIVE: Patient arrives with rollator. She states her back and right knee is feeling a little bit better. She is interested in dry needling. She denies back surgery or scoliosis. She bruises easily.   PAIN: NPRS 2/10 low back and right sacral area, 1/10 lateral R knee.   OBJECTIVE   TODAY'S TREATMENT:  Therapeutic exercise: to centralize symptoms and improve ROM, strength, muscular endurance, and activity tolerance required for successful completion of functional activities.  - NuStep level 3 using bilateral upper and lower extremities. Seat/handle setting 9/10. For improved extremity mobility, muscular endurance, and activity tolerance; and to induce the analgesic effect of aerobic exercise, stimulate improved joint nutrition, and prepare body structures and systems for following interventions. x 6 minutes. Average SPM = 85.   (Manual therapy/dry needling - see below).   - standing static balance, 3x50-60 seconds on airex pad with seated rest break in between. SBA. TM bar for UE support as needed.     Manual therapy: to reduce pain and tissue tension, improve range of motion, neuromodulation, in order to promote improved ability to complete functional activities. PRONE - STM to B posterior lumbar paraspinals focusing on R >L   Modality: (unbilled) Dry needling performed to lumbar spine to decrease pain and spasms along patient's lumbar and right glute region with patient in prone utilizing 2 dry needle(s) .75m x 75-1066mwith 2 sticks at right and left L5 lumbar  multifidi . Patient educated about the risks and benefits from therapy and verbally consents to treatment.  Dry needling performed by SaEverlean AlstromSnGraylon GoodT, DPT who is certified in this technique.    Pt required multimodal cuing for proper technique and to acilitate improved neuromuscular control, strength, range of motion, and functional ability resulting in improved performance and form. Requires CGA - SBA for balance exercises.   PATIENT EDUCATION:  Education details: form/technique with exercise. Person educated: Patient Education method: Explanation, demonstration, verbal cuing, tactile cuing. Education comprehension: verbalized understanding, demonstrated understanding, and needs further education     HOME EXERCISE PROGRAM: Access Code: [email or web address]: [email or web address] Date: 12/14/2021 Prepared by: SaRosita KeaExercises - Supine Lower Trunk Rotation  - 2 x daily - 1 sets - 20 reps - 1-5 seconds hold - Prone Press Up  - 2 x daily - 2 sets - 5 reps - Seated Toe Raise  - 1 x daily - 3 sets - 15 reps - 1 second hold  HOME EXERCISE PROGRAM [[id number]]Seated marching with weighted twist -  Repeat 10 Times, Hold 1 Second(s), Complete 3 Sets, Perform 1 Times a Day    ASSESSMENT:   CLINICAL IMPRESSION: Patient arrives with  report of continued improvement with right knee pain and low back/R glute pain but still limited by low back and right glute pain. Dry needling was utilized to attempt to decrease right glute and back pain with patient reporting mild improvement in response. Plan to continue with exercises for balance and functional strength as tolerated next session with hopeful carry over of effects of dry needling from today to make strength and balance exercises more comfortable. Patient continues to have significant mobility and fall risk limitations.  Patient would benefit from continued management of limiting condition by skilled physical therapist to address  remaining impairments and functional limitations to work towards stated goals and return to PLOF or maximal functional independence.    From PT Eval 07/10/2021:  Patient is a 46 y.o. female referred to outpatient physical therapy with a medical diagnosis of bilateral leg weakness who presents with signs and symptoms consistent with generalized weakness, balance deficits, and motor control problems likely related to deconditioning and FSHD muscular dystrophy. Patient presents with significant balance, motor control, posture, postural, gait, pain, muscle performance (strength/power/endurance) and activity tolerance impairments that are limiting ability to complete basic mobility and self care including ambulation, stairs, transfers, ADLs, and IADLs without difficulty. At eval requires assistance to navigate stairs, assistance with ADLs and  IADLs, and ambulates with RW when she previously did not need an assistive device. Patient would likely benefit from bilateral AFOs to address foot drop found at both ankles and improve her steppage gait. Patient will benefit from skilled physical therapy intervention to address current body structure impairments and activity limitations to improve function and work towards goals set in current POC in order to return to prior level of function or maximal functional improvement.   OBJECTIVE IMPAIRMENTS Abnormal gait, decreased activity tolerance, decreased balance, decreased coordination, decreased endurance, decreased mobility, difficulty walking, decreased ROM, decreased strength, impaired perceived functional ability, increased muscle spasms, impaired tone, impaired UE functional use, improper body mechanics, postural dysfunction, and pain.    ACTIVITY LIMITATIONS cleaning, community activity, driving, meal prep, laundry, and shopping.    PERSONAL FACTORS Behavior pattern, Fitness, Past/current experiences, Time since onset of injury/illness/exacerbation, Transportation,  and 3+ comorbidities:    muscular dystrophy (FSHD - facioscapulohumeral muscular dystrophy), anxiety and depression, bipolar 1 disorder, borderline personality disorder, diabetes, GERD, gout, pancreatitis, alcoholic liver disease, tobacco abuse,  foot drop, neuropathy, recovering alcoholic, history of drug abuse, history of hernia repair are also affecting patient's functional outcome.      REHAB POTENTIAL: Good   CLINICAL DECISION MAKING: Evolving/moderate complexity   EVALUATION COMPLEXITY: Moderate     GOALS: Goals reviewed with patient? No   SHORT TERM GOALS: Target date: 07/24/2021   Patient will be independent with initial home exercise program for self-management of symptoms. Baseline: Initial HEP to be provided at visit 2 as appropriate (07/10/21); Goal status: Met     LONG TERM GOALS: Target date: 10/02/2021; updated to 12/27/2021 for all unmet goals on 10/04/2021; updated to 03/26/2022 for all unmet goals on 01/01/2022;   Patient will be independent with a long-term home exercise program for self-management of symptoms.  Baseline: Initial HEP to be provided at visit 2 as appropriate (07/10/21); participating as tolerated, recently too much pain to participate (09/04/2021); participating regularly (10/04/2021); participating as able (11/21/2021); not participating regularly over last couple of weeks (01/01/2022);  Goal status: Ongoing   2.  Patient will demonstrate improved FOTO score by 10 points from baseline to demonstrate improvement in overall condition and self-reported  functional ability.  Baseline: to be measured visit 2 as appropriate (07/10/21); 46 at visit # 2 (07/17/2021); 50 at visit # 10 (09/04/2021); 53 at visit #15 (10/04/2021); 50 at visit # 20 (11/21/2021); 52 at visit # 26 (01/01/2022);  Goal status: In-Progress   3.  Patient will ambulate equal or greater than 1000 feet during 6 Min Walk Test using [facility] or less restrictive assistive device to improve community ambulation.   Baseline: To be tested visit 2 as appropriate (07/10/21); 310 feet with RW (07/17/2021); 556 feet with rollator (09/04/2021); 600 feet with rollator (10/04/2021); 300 feet with rollator - had to stop test at 3 min due to intolerable right glute pain (11/21/2021); 420 feet with rollator and steppage gait, R > L. Stopped at 5 min due to feeling of instability in low back to mid thoracic spine (01/01/2022);  Goal status: In-Progress   4.  Patient will complete 5 Time Sit To Stand test in equal or less than 20 seconds with no UE support from 18.5 inch plinth to demonstrate improved LE functional strength and power for basic mobility and improved fall risk.  Baseline: 24 seconds with B UE on 18.5 inch plinth.  (07/10/21); 20 seconds with B UE on thighs from 18.5 inch plinth (09/04/2021); 19 seconds with B UE on thighs from 18.5 inch plinth (10/04/2021); 22 seconds with B UE on thighs from 18.5 inch plinth (11/21/2021); 20 seconds with B UE on thighs from 18.5 inch plinth (01/01/2022);  Goal status: In-Progress   5.  Patient be able to ascend and descend stairs at her home mod I with use of B handrails.  Baseline: able to ascend/descend 4 steps in the clinic with B UE support on handrails and supervision - reports currently needs assistance to descend steps at home. (07/10/21); able to ascend/descend 4 steps in the clinic with B UE support on handrails and supervision, leading up and down with left. able to perform with reversed LE leads (09/04/2021); able to ascend/descend 4 steps in the clinic with B UE support on handrails and supervision, step over step with heavy UE use on the way down (10/04/2021); able to ascend/descend 4 steps in the clinic with B UE support on handrails and supervision, step over step up and step to leading with R LE with heavy UE use on the way down (11/21/2021); ascend/descends 4 steps in the clinic with B UE support on handrails and supervision, step over step up and down with heavy UE use on the  way down (01/01/2022);  Goal status: In-Progress   6.  Patient will demonstrate ability to perform static balance in tandem stance for equal or greater than 30 seconds on each side, to demonstrate improved balance and decreased fall risk.  Baseline: right foot front 10 seconds, left foot front 7 seconds (07/10/2021); right foot front 4 seconds, left foot front 6 seconds (09/04/2021); right foot front 9 seconds, left foot front 31 seconds (10/04/2021); right foot front 6.5 seconds, left foot front 8.5 seconds (11/21/2021); right foot front 8 seconds, left foot front 7 seconds (01/01/2022);  Goal status: In-Progress     PLAN: PT FREQUENCY: 2x/week   PT DURATION: 12 weeks   PLANNED INTERVENTIONS: Therapeutic exercises, Therapeutic activity, Neuromuscular re-education, Balance training, Gait training, Patient/Family education, Joint mobilization, Orthotic/Fit training, DME instructions, Dry Needling, Electrical stimulation, Cryotherapy, Moist heat, Splintting, Taping, and Manual therapy   PLAN FOR NEXT SESSION: continue with moderate intensity functional strengthening and balance exercises as able, address back and right  glute pain to allow return to functional strengthening and balance with better tolerance.      Everlean Alstrom. Graylon Good, PT, DPT 02/13/22, 7:33 PM  [facility] [address] [place], [place] [address] P: [phone] I F: [phone]

## 2022-02-15 ENCOUNTER — Ambulatory Visit: Payer: Medicare Other | Admitting: Physical Therapy

## 2022-02-15 ENCOUNTER — Encounter: Payer: Self-pay | Admitting: Physical Therapy

## 2022-02-15 DIAGNOSIS — R2689 Other abnormalities of gait and mobility: Secondary | ICD-10-CM

## 2022-02-15 DIAGNOSIS — Z9181 History of falling: Secondary | ICD-10-CM

## 2022-02-15 DIAGNOSIS — M6281 Muscle weakness (generalized): Secondary | ICD-10-CM

## 2022-02-15 NOTE — Therapy (Signed)
OUTPATIENT PHYSICAL THERAPY TREATMENT NOTE / PROGRESS NOTE Dates of reporting from 11/21/2021 to 02/15/2022  Patient Name: [name] MRN: [id number] DOB:1976/01/04, 46 y.o., female Today's Date: 02/15/2022  PCP: Janine Limbo, PA-C REFERRING PROVIDER: Janine Limbo, PA-C  END OF SESSION:   PT End of Session - 02/15/22 1003     Visit Number 30    Number of Visits 38    Date for PT Re-Evaluation 03/26/22    Authorization Type [employer] MEDICARE/Medicaid reporting period from 11/21/2021    Progress Note Due on Visit 10    PT Start Time 0949    PT Stop Time 1027    PT Time Calculation (min) 38 min    Equipment Utilized During Treatment Gait belt    Activity Tolerance Patient limited by pain;Patient limited by fatigue    Behavior During Therapy [facility] for tasks assessed/performed              Past Medical History:  Diagnosis Date   Alcohol abuse    Anxiety    Anxiety    Anxiety and depression    Asthma    Bipolar disorder ([place])    Borderline personality disorder ([place])    Depression    Diabetes ([place])    Type 2    Dyslipidemia    Esophageal varices ([name])    Foot drop    right   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[place])    GERD (gastroesophageal reflux disease)    Gout    History of drug abuse ([place])    Insomnia    Muscular dystrophies ([place])    Neuropathy    Pancreatitis    Recovering alcoholic ([place])    Past Surgical History:  Procedure Laterality Date   HERNIA REPAIR     IR FLUORO GUIDE CV LINE RIGHT  12/30/2020   IR REMOVAL TUN CV CATH W/O FL  01/19/2021   IR US GUIDE VASC ACCESS RIGHT  12/30/2020   Patient Active Problem List   Diagnosis Date Noted   Frequent falls 02/12/2021   Leukocytosis    Aspiration pneumonia of both lower lobes due to gastric secretions ([place]) 01/04/2021   Pressure injury of skin 12/24/2020   Acute hepatic encephalopathy ([place]) 12/20/2020   Sepsis ([place]) 12/19/2020   Acute hyponatremia 12/18/2020   Alcohol use disorder, moderate,  dependence ([place]) 12/18/2020   Hepatic steatosis 12/18/2020   Polysubstance abuse ([place]) 12/18/2020   Hyperglycemia due to type 2 diabetes mellitus ([place]) 12/18/2020   Hyperbilirubinemia 12/18/2020   Lactic acidosis 54/00/8676   Alcoholic liver disease ([place]) 12/18/2020   Bipolar 1 disorder, manic, moderate ([place]) 09/10/2018   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) 09/30/2012   Alcohol abuse 09/26/2012   Trichomonas infection 09/26/2012   Dehydration 09/26/2012   Hypokalemia 11/14/2011   Thrombocytopenia ([place]) 11/14/2011   Alcohol withdrawal ([place]) 11/13/2011   Bipolar 2 disorder ([place]) 11/13/2011   Tobacco abuse 11/13/2011   Cocaine abuse ([place]) 11/13/2011   Gout 11/13/2011     REFERRING DIAG: bilateral leg weakness  THERAPY DIAG:  Other abnormalities of gait and mobility  Muscle weakness (generalized)  History of falling  Rationale for Evaluation and Treatment Rehabilitation  PERTINENT HISTORY: Patient is a 46 y.o. female who presents to  outpatient physical therapy with a referral for medical diagnosis bilateral leg weakness. This patient's chief complaints consist of difficulty with weakness and balance with history of frequent falling leading to the following functional deficits: difficulty with basic mobility including ambulation, stairs, transfers; difficulty with ADLs, IADLs. Currently requires assistance to navigate stairs, assistance with ADLs and  IADLs, and ambulates with RW when she previously did not need an assistive device. Relevant past medical history and comorbidities include muscular dystrophy (FSHD - facioscapulohumeral muscular dystrophy), anxiety and depression,  bipolar 1 disorder, borderline personality disorder, diabetes, GERD, gout, pancreatitis, alcoholic liver disease, tobacco abuse,  foot drop, neuropathy, recovering alcoholic, history of drug abuse, history of hernia repair.  Patient denies hx of cancer, stroke, seizures, lung problems, heart problems, unexplained weight loss, unexplained changes in bowel or bladder problems, unexplained stumbling or dropping things, osteoporosis, and spinal surgery  PRECAUTIONS: Fall, not drink alcohol.  SUBJECTIVE: Patient arrives with rollator. She states she feels dry needling improved her pain further with only mild sensation of discomfort in low back upon arrival and no knee pain. She denies falls since last PT session. She feels that PT is very helpful in keeping her pain under control and improving her functional mobility.   PAIN: NPRS 0/10 in low back.    OBJECTIVE   SELF-REPORTED FUNCTION FOTO score: 50/100 (NOC-Neuromuscular disorder questionnaire)   FUNCTIONAL/BALANCE TESTS: 6 Minute Walk Test: 573 feet with rollator and steppage gait, R > L. Occasional standing breaks. Low back pain increased to 3.5/10.    Five Time Sit to Stand (5TSTS, best of 3 trials): 19 seconds without use of B UE support (2nd attempt).    Static balance (best of 3 trials): tandem stance, eyes open: right foot front 9 seconds, left foot front 20 seconds.    Stairs: able to ascend/descend 4 steps in the clinic with B UE support on handrails and supervision, step over step up and down with heavy UE use on the way down. leads up with right foot step to gait.  TODAY'S TREATMENT:  Therapeutic exercise: to centralize symptoms and improve ROM, strength, muscular endurance, and activity tolerance required for successful completion of functional activities.  - ambulation over ground for distance in 6 minutes with rollator and standing breaks as needed (see 6MWT above).  - sit <> stand for speed from 18.5 inch plinth, 3x5 (see 5  times sit to stand test above). Needed B UE support on knees.  - tandem stance static balance 1x3 with each foot front  Pt required multimodal cuing for proper technique and to acilitate improved neuromuscular control, strength, range of motion, and functional ability resulting in improved performance and form. Requires CGA - SBA for balance exercises.   PATIENT EDUCATION:  Education details: form/technique with exercise. Person educated: Patient Education method: Explanation, demonstration, verbal cuing, tactile cuing. Education comprehension: verbalized understanding, demonstrated understanding, and needs further education     HOME EXERCISE PROGRAM: Access Code: [email or web address] URL: [email or web address] Date: 02/15/2022 Prepared by: Rosita Kea  Exercises - Sit to Stand Without Arm Support  - 3 x weekly - 3 sets - 5 reps - Seated Toe Raise  - 3 x weekly - 3 sets - 15 reps - 1 second hold - Supine Lower Trunk Rotation  - 3 x weekly - 1 sets - 20 reps - 1-5 seconds hold - Prone Press Up  - 3 x weekly - 2 sets - 5 reps  HOME EXERCISE PROGRAM [[id number]]  Seated marching with weighted twist -  Repeat 10 Times, Hold 1 Second(s), Complete 3 Sets, Perform 1 Times a Day    ASSESSMENT:   CLINICAL IMPRESSION: Patient has attended 30 physical therapy sessions since starting this episode of care on 07/10/2021. Her attendance has been interrupted by medical, transportation, and pain limitations and she has not been able to attend as regularly as recommended in the past two months. Since last progress note, patient has experienced improvement in low back, right glute, and right knee pain with increased focus on pain control including dry needling during PT sessions. Today she was able to complete entire 6 Minute Walk Test without stopping earlier due to pain and demonstrates improved tolerance for sit to stands. Patient continues to be limited by her muscular dystrophy which is expected to  worsen over time. Continued PT is medically necessary to help maximize function and quality of life in the setting of a chronic degenerative disease process. Plan to focus more on strengthening and balance as appropriate with pain under better control. Pain control interventions to be utilized as needed. Patient would benefit from continued management of limiting condition by skilled physical therapist to address remaining impairments and functional limitations to work towards stated goals and return to PLOF or maximal functional independence.    From PT Eval 07/10/2021:  Patient is a 46 y.o. female referred to outpatient physical therapy with a medical diagnosis of bilateral leg weakness who presents with signs and symptoms consistent with generalized weakness, balance deficits, and motor control problems likely related to deconditioning and FSHD muscular dystrophy. Patient presents with significant balance, motor control, posture, postural, gait, pain, muscle performance (strength/power/endurance) and activity tolerance impairments that are limiting ability to complete basic mobility and self care including ambulation, stairs, transfers, ADLs, and IADLs without difficulty. At eval requires assistance to navigate stairs, assistance with ADLs and  IADLs, and ambulates with RW when she previously did not need an assistive device. Patient would likely benefit from bilateral AFOs to address foot drop found at both ankles and improve her steppage gait. Patient will benefit from skilled physical therapy intervention to address current body structure impairments and activity limitations to improve function and work towards goals set in current POC in order to return to prior level of function or maximal functional improvement.   OBJECTIVE IMPAIRMENTS Abnormal gait, decreased activity tolerance, decreased balance, decreased coordination, decreased endurance, decreased mobility, difficulty walking, decreased ROM,  decreased strength, impaired perceived functional ability, increased muscle spasms, impaired tone, impaired UE functional use, improper body mechanics, postural dysfunction, and pain.    ACTIVITY LIMITATIONS cleaning, community activity, driving, meal prep, laundry, and shopping.    PERSONAL FACTORS Behavior pattern, Fitness, Past/current experiences, Time since onset of injury/illness/exacerbation, Transportation, and 3+ comorbidities:    muscular dystrophy (FSHD - facioscapulohumeral muscular dystrophy), anxiety and depression, bipolar 1 disorder, borderline personality disorder, diabetes, GERD, gout, pancreatitis, alcoholic liver disease, tobacco abuse,  foot drop, neuropathy, recovering alcoholic, history of drug abuse, history of hernia repair are also affecting patient's functional outcome.      REHAB POTENTIAL: Good   CLINICAL DECISION MAKING: Evolving/moderate complexity   EVALUATION COMPLEXITY: Moderate     GOALS: Goals reviewed with patient? No   SHORT TERM GOALS: Target date: 07/24/2021   Patient will be independent with initial home exercise program for self-management of symptoms. Baseline: Initial HEP to be provided at visit 2 as appropriate (07/10/21); Goal status: Met     LONG TERM GOALS: Target date: 10/02/2021; updated to 12/27/2021 for all unmet goals  on 10/04/2021; updated to 03/26/2022 for all unmet goals on 01/01/2022;   Patient will be independent with a long-term home exercise program for self-management of symptoms.  Baseline: Initial HEP to be provided at visit 2 as appropriate (07/10/21); participating as tolerated, recently too much pain to participate (09/04/2021); participating regularly (10/04/2021); participating as able (11/21/2021); not participating regularly over last couple of weeks (01/01/2022); no participating regularly since last progress note (02/15/2022);  Goal status: Ongoing   2.  Patient will demonstrate improved FOTO score by 10 points from baseline  to demonstrate improvement in overall condition and self-reported functional ability.  Baseline: to be measured visit 2 as appropriate (07/10/21); 46 at visit # 2 (07/17/2021); 50 at visit # 10 (09/04/2021); 53 at visit #15 (10/04/2021); 50 at visit # 20 (11/21/2021); 52 at visit # 26 (01/01/2022); 50 at visit #30 (02/15/2022);  Goal status: In-Progress   3.  Patient will ambulate equal or greater than 1000 feet during 6 Min Walk Test using [facility] or less restrictive assistive device to improve community ambulation.  Baseline: To be tested visit 2 as appropriate (07/10/21); 310 feet with RW (07/17/2021); 556 feet with rollator (09/04/2021); 600 feet with rollator (10/04/2021); 300 feet with rollator - had to stop test at 3 min due to intolerable right glute pain (11/21/2021); 420 feet with rollator and steppage gait, R > L. Stopped at 5 min due to feeling of instability in low back to mid thoracic spine (01/01/2022); 573 feet with rollator and steppage gait, R > L. Occasional standing breaks. Low back pain increased to 3.5/10 (02/15/2022);  Goal status: In-Progress   4.  Patient will complete 5 Time Sit To Stand test in equal or less than 20 seconds with no UE support from 18.5 inch plinth to demonstrate improved LE functional strength and power for basic mobility and improved fall risk.  Baseline: 24 seconds with B UE on 18.5 inch plinth.  (07/10/21); 20 seconds with B UE on thighs from 18.5 inch plinth (09/04/2021); 19 seconds with B UE on thighs from 18.5 inch plinth (10/04/2021); 22 seconds with B UE on thighs from 18.5 inch plinth (11/21/2021); 20 seconds with B UE on thighs from 18.5 inch plinth (01/01/2022); 19 seconds without use of B UE support (02/15/2022);  Goal status: In-Progress   5.  Patient be able to ascend and descend stairs at her home mod I with use of B handrails.  Baseline: able to ascend/descend 4 steps in the clinic with B UE support on handrails and supervision - reports currently needs assistance  to descend steps at home. (07/10/21); able to ascend/descend 4 steps in the clinic with B UE support on handrails and supervision, leading up and down with left. able to perform with reversed LE leads (09/04/2021); able to ascend/descend 4 steps in the clinic with B UE support on handrails and supervision, step over step with heavy UE use on the way down (10/04/2021); able to ascend/descend 4 steps in the clinic with B UE support on handrails and supervision, step over step up and step to leading with R LE with heavy UE use on the way down (11/21/2021); ascend/descends 4 steps in the clinic with B UE support on handrails and supervision, step over step up and down with heavy UE use on the way down (01/01/2022); able to ascend/descend 4 steps in the clinic with B UE support on handrails and supervision, step over step up and down with heavy UE use on the way down. leads up  with right foot step to gait (02/15/2022);  Goal status: In-Progress   6.  Patient will demonstrate ability to perform static balance in tandem stance for equal or greater than 30 seconds on each side, to demonstrate improved balance and decreased fall risk.  Baseline: right foot front 10 seconds, left foot front 7 seconds (07/10/2021); right foot front 4 seconds, left foot front 6 seconds (09/04/2021); right foot front 9 seconds, left foot front 31 seconds (10/04/2021); right foot front 6.5 seconds, left foot front 8.5 seconds (11/21/2021); right foot front 8 seconds, left foot front 7 seconds (01/01/2022);  right foot front 9 seconds, left foot front 20 seconds (02/15/2022);  Goal status: In-Progress     PLAN: PT FREQUENCY: 2x/week   PT DURATION: 12 weeks   PLANNED INTERVENTIONS: Therapeutic exercises, Therapeutic activity, Neuromuscular re-education, Balance training, Gait training, Patient/Family education, Joint mobilization, Orthotic/Fit training, DME instructions, Dry Needling, Electrical stimulation, Cryotherapy, Moist heat, Splintting,  Taping, and Manual therapy   PLAN FOR NEXT SESSION: continue with moderate intensity functional strengthening and balance exercises as able, address back and right glute pain to allow return to functional strengthening and balance with better tolerance.      Everlean Alstrom. Graylon Good, PT, DPT 02/15/22, 3:36 PM  [facility] [address] [place], [place] [address] P: [phone] I F: [phone]

## 2022-02-19 ENCOUNTER — Ambulatory Visit: Payer: Medicare Other | Admitting: Physical Therapy

## 2022-02-21 ENCOUNTER — Ambulatory Visit: Payer: Medicare Other | Admitting: Physical Therapy

## 2022-02-27 ENCOUNTER — Ambulatory Visit: Payer: Medicare Other | Admitting: Physical Therapy

## 2022-03-01 ENCOUNTER — Encounter: Payer: Self-pay | Admitting: Physical Therapy

## 2022-03-01 ENCOUNTER — Ambulatory Visit: Payer: Medicare Other | Attending: Internal Medicine | Admitting: Physical Therapy

## 2022-03-01 DIAGNOSIS — Z9181 History of falling: Secondary | ICD-10-CM | POA: Insufficient documentation

## 2022-03-01 DIAGNOSIS — M6281 Muscle weakness (generalized): Secondary | ICD-10-CM | POA: Insufficient documentation

## 2022-03-01 DIAGNOSIS — R2689 Other abnormalities of gait and mobility: Secondary | ICD-10-CM | POA: Insufficient documentation

## 2022-03-01 NOTE — Therapy (Signed)
OUTPATIENT PHYSICAL THERAPY TREATMENT NOTE   Patient Name: [name] MRN: [id number] DOB:[birth year], 46 y.o., female Today's Date: 03/01/2022  PCP: Janine Limbo, PA-C REFERRING PROVIDER: Janine Limbo, PA-C  END OF SESSION:   PT End of Session - 03/01/22 1536     Visit Number 31    Number of Visits 73    Date for PT Re-Evaluation 03/26/22    Authorization Type [employer] MEDICARE/Medicaid reporting period from 02/15/2022    Progress Note Due on Visit 40    PT Start Time 1517    PT Stop Time 1557    PT Time Calculation (min) 40 min    Equipment Utilized During Treatment Gait belt    Activity Tolerance Patient limited by pain;Patient limited by fatigue    Behavior During Therapy [facility] for tasks assessed/performed               Past Medical History:  Diagnosis Date   Alcohol abuse    Anxiety    Anxiety    Anxiety and depression    Asthma    Bipolar disorder ([place])    Borderline personality disorder ([place])    Depression    Diabetes ([place])    Type 2    Dyslipidemia    Esophageal varices ([name])    Foot drop    right   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[place])    GERD (gastroesophageal reflux disease)    Gout    History of drug abuse ([place])    Insomnia    Muscular dystrophies ([place])    Neuropathy    Pancreatitis    Recovering alcoholic ([place])    Past Surgical History:  Procedure Laterality Date   HERNIA REPAIR     IR FLUORO GUIDE CV LINE RIGHT  12/30/2020   IR REMOVAL TUN CV CATH W/O FL  01/19/2021   IR US GUIDE VASC ACCESS RIGHT  12/30/2020   Patient Active Problem List   Diagnosis Date Noted   Frequent falls 02/12/2021   Leukocytosis    Aspiration pneumonia of both lower lobes due to gastric secretions ([place]) 01/04/2021   Pressure injury of skin 12/24/2020   Acute hepatic encephalopathy ([place]) 12/20/2020   Sepsis ([place]) 12/19/2020   Acute hyponatremia 12/18/2020   Alcohol use disorder, moderate, dependence ([place]) 12/18/2020   Hepatic steatosis 12/18/2020    Polysubstance abuse ([place]) 12/18/2020   Hyperglycemia due to type 2 diabetes mellitus ([place]) 12/18/2020   Hyperbilirubinemia 12/18/2020   Lactic acidosis 01/60/1093   Alcoholic liver disease ([place]) 12/18/2020   Bipolar 1 disorder, manic, moderate ([place]) 09/10/2018   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   Diabetes ([place]) 09/10/2018   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[place]) 03/28/2018   Chronic gout of foot 09/29/2015   Muscular dystrophy ([place]) 09/29/2015   Type 2 diabetes mellitus without complication ([place]) 23/55/7322   Anxiety and depression 10/01/2012   Protein-calorie malnutrition, severe ([place]) 09/30/2012   Alcohol abuse 09/26/2012   Trichomonas infection 09/26/2012   Dehydration 09/26/2012   Hypokalemia 11/14/2011   Thrombocytopenia ([place]) 11/14/2011   Alcohol withdrawal ([place]) 11/13/2011   Bipolar 2 disorder ([place]) 11/13/2011   Tobacco abuse 11/13/2011   Cocaine abuse ([place]) 11/13/2011   Gout 11/13/2011     REFERRING DIAG: bilateral leg weakness  THERAPY DIAG:  Other abnormalities of gait and mobility  Muscle weakness (generalized)  History of falling  Rationale for Evaluation and Treatment Rehabilitation  PERTINENT HISTORY: Patient is a 46 y.o. female who presents to outpatient physical therapy with a referral for medical  diagnosis bilateral leg weakness. This patient's chief complaints consist of difficulty with weakness and balance with history of frequent falling leading to the following functional deficits: difficulty with basic mobility including ambulation, stairs, transfers; difficulty with ADLs, IADLs. Currently requires assistance to navigate stairs, assistance with ADLs and  IADLs, and ambulates with RW when she previously did not need an assistive device. Relevant past medical history and comorbidities include muscular dystrophy (FSHD - facioscapulohumeral muscular dystrophy), anxiety and depression, bipolar 1 disorder, borderline personality disorder,  diabetes, GERD, gout, pancreatitis, alcoholic liver disease, tobacco abuse,  foot drop, neuropathy, recovering alcoholic, history of drug abuse, history of hernia repair.  Patient denies hx of cancer, stroke, seizures, lung problems, heart problems, unexplained weight loss, unexplained changes in bowel or bladder problems, unexplained stumbling or dropping things, osteoporosis, and spinal surgery  PRECAUTIONS: Fall, not drink alcohol.  SUBJECTIVE: Patient arrives with rollator. Patient reports she missed some of her past PT sessions due to pain, dystrophy flair, and transportation issues.  PAIN: NPRS 4-5/10 in low back and right glute.   OBJECTIVE     TODAY'S TREATMENT:  Manual therapy: to reduce pain and tissue tension, improve range of motion, neuromodulation, in order to promote improved ability to complete functional activities. PRONE - STM to B posterior lumbar paraspinals focusing on R >L  - CPA to lumbar segments, grade III-IV   Modality: (unbilled) Dry needling performed to lumbar spine to decrease pain and spasms along patient's lumbar and right glute region with patient in prone utilizing 2 dry needle(s) .47m x 1055mwith 6 sticks at right and left L3-L5 lumbar multifidi . Patient educated about the risks and benefits from therapy and verbally consents to treatment.  Dry needling performed by SaEverlean AlstromSnGraylon GoodT, DPT who is certified in this technique.  Therapeutic exercise: to centralize symptoms and improve ROM, strength, muscular endurance, and activity tolerance required for successful completion of functional activities.  - sit <> stand from 18.5 inch plinth, 4x5 (see 5 times sit to stand test above). Needed intermittent B UE support.  - wide stance static balance on airex with head turns, 3x10 each direction.  Seated rest between each set.  - standing AROM hip abduction with B UE support, 3x5 each side with standing rests between each set.  - standing hip extension AROM with  conscious glute squeeze and sliding toe on floor with slider, 2x10 each side with B UE support and minimal rest between sets.  - seated good morning with hands on hip while sitting on high plinth, 3x5 with cuing for technique and good posture.   Pt required multimodal cuing for proper technique and to acilitate improved neuromuscular control, strength, range of motion, and functional ability resulting in improved performance and form. Requires CGA - SBA for balance exercises.     PATIENT EDUCATION:  Education details: form/technique with exercise. Person educated: Patient Education method: Explanation, demonstration, verbal cuing, tactile cuing. Education comprehension: verbalized understanding, demonstrated understanding, and needs further education     HOME EXERCISE PROGRAM: Access Code: [email or web address]: [email or web address] Date: 02/15/2022 Prepared by: SaRosita KeaExercises - Sit to Stand Without Arm Support  - 3 x weekly - 3 sets - 5 reps - Seated Toe Raise  - 3 x weekly - 3 sets - 15 reps - 1 second hold - Supine Lower Trunk Rotation  - 3 x weekly - 1 sets - 20 reps - 1-5 seconds hold - Prone Press Up  - 3 x weekly - 2  sets - 5 reps  HOME EXERCISE PROGRAM [[id number]]  Seated marching with weighted twist -  Repeat 10 Times, Hold 1 Second(s), Complete 3 Sets, Perform 1 Times a Day    ASSESSMENT:   CLINICAL IMPRESSION: Patient arrives after missing PT for 2 weeks because of illness, pain, and transportation issues. Today's session focused on pain relief followed by strength and balance exercises to improve patient's functional independence and safety. Patient tolerated treatment well overall and continues to require skilled PT to guide session and perform interventions .Patient would benefit from continued management of limiting condition by skilled physical therapist to address remaining impairments and functional limitations to work towards stated goals and return to  PLOF or maximal functional independence.    From PT Eval 07/10/2021:  Patient is a 46 y.o. female referred to outpatient physical therapy with a medical diagnosis of bilateral leg weakness who presents with signs and symptoms consistent with generalized weakness, balance deficits, and motor control problems likely related to deconditioning and FSHD muscular dystrophy. Patient presents with significant balance, motor control, posture, postural, gait, pain, muscle performance (strength/power/endurance) and activity tolerance impairments that are limiting ability to complete basic mobility and self care including ambulation, stairs, transfers, ADLs, and IADLs without difficulty. At eval requires assistance to navigate stairs, assistance with ADLs and  IADLs, and ambulates with RW when she previously did not need an assistive device. Patient would likely benefit from bilateral AFOs to address foot drop found at both ankles and improve her steppage gait. Patient will benefit from skilled physical therapy intervention to address current body structure impairments and activity limitations to improve function and work towards goals set in current POC in order to return to prior level of function or maximal functional improvement.   OBJECTIVE IMPAIRMENTS Abnormal gait, decreased activity tolerance, decreased balance, decreased coordination, decreased endurance, decreased mobility, difficulty walking, decreased ROM, decreased strength, impaired perceived functional ability, increased muscle spasms, impaired tone, impaired UE functional use, improper body mechanics, postural dysfunction, and pain.    ACTIVITY LIMITATIONS cleaning, community activity, driving, meal prep, laundry, and shopping.    PERSONAL FACTORS Behavior pattern, Fitness, Past/current experiences, Time since onset of injury/illness/exacerbation, Transportation, and 3+ comorbidities:    muscular dystrophy (FSHD - facioscapulohumeral muscular dystrophy),  anxiety and depression, bipolar 1 disorder, borderline personality disorder, diabetes, GERD, gout, pancreatitis, alcoholic liver disease, tobacco abuse,  foot drop, neuropathy, recovering alcoholic, history of drug abuse, history of hernia repair are also affecting patient's functional outcome.      REHAB POTENTIAL: Good   CLINICAL DECISION MAKING: Evolving/moderate complexity   EVALUATION COMPLEXITY: Moderate     GOALS: Goals reviewed with patient? No   SHORT TERM GOALS: Target date: 07/24/2021   Patient will be independent with initial home exercise program for self-management of symptoms. Baseline: Initial HEP to be provided at visit 2 as appropriate (07/10/21); Goal status: Met     LONG TERM GOALS: Target date: 10/02/2021; updated to 12/27/2021 for all unmet goals on 10/04/2021; updated to 03/26/2022 for all unmet goals on 01/01/2022;   Patient will be independent with a long-term home exercise program for self-management of symptoms.  Baseline: Initial HEP to be provided at visit 2 as appropriate (07/10/21); participating as tolerated, recently too much pain to participate (09/04/2021); participating regularly (10/04/2021); participating as able (11/21/2021); not participating regularly over last couple of weeks (01/01/2022); no participating regularly since last progress note (02/15/2022);  Goal status: Ongoing   2.  Patient will demonstrate improved FOTO score by 10  points from baseline to demonstrate improvement in overall condition and self-reported functional ability.  Baseline: to be measured visit 2 as appropriate (07/10/21); 46 at visit # 2 (07/17/2021); 50 at visit # 10 (09/04/2021); 53 at visit #15 (10/04/2021); 50 at visit # 20 (11/21/2021); 52 at visit # 26 (01/01/2022); 50 at visit #30 (02/15/2022);  Goal status: In-Progress   3.  Patient will ambulate equal or greater than 1000 feet during 6 Min Walk Test using [facility] or less restrictive assistive device to improve community ambulation.   Baseline: To be tested visit 2 as appropriate (07/10/21); 310 feet with RW (07/17/2021); 556 feet with rollator (09/04/2021); 600 feet with rollator (10/04/2021); 300 feet with rollator - had to stop test at 3 min due to intolerable right glute pain (11/21/2021); 420 feet with rollator and steppage gait, R > L. Stopped at 5 min due to feeling of instability in low back to mid thoracic spine (01/01/2022); 573 feet with rollator and steppage gait, R > L. Occasional standing breaks. Low back pain increased to 3.5/10 (02/15/2022);  Goal status: In-Progress   4.  Patient will complete 5 Time Sit To Stand test in equal or less than 20 seconds with no UE support from 18.5 inch plinth to demonstrate improved LE functional strength and power for basic mobility and improved fall risk.  Baseline: 24 seconds with B UE on 18.5 inch plinth.  (07/10/21); 20 seconds with B UE on thighs from 18.5 inch plinth (09/04/2021); 19 seconds with B UE on thighs from 18.5 inch plinth (10/04/2021); 22 seconds with B UE on thighs from 18.5 inch plinth (11/21/2021); 20 seconds with B UE on thighs from 18.5 inch plinth (01/01/2022); 19 seconds without use of B UE support (02/15/2022);  Goal status: In-Progress   5.  Patient be able to ascend and descend stairs at her home mod I with use of B handrails.  Baseline: able to ascend/descend 4 steps in the clinic with B UE support on handrails and supervision - reports currently needs assistance to descend steps at home. (07/10/21); able to ascend/descend 4 steps in the clinic with B UE support on handrails and supervision, leading up and down with left. able to perform with reversed LE leads (09/04/2021); able to ascend/descend 4 steps in the clinic with B UE support on handrails and supervision, step over step with heavy UE use on the way down (10/04/2021); able to ascend/descend 4 steps in the clinic with B UE support on handrails and supervision, step over step up and step to leading with R LE with  heavy UE use on the way down (11/21/2021); ascend/descends 4 steps in the clinic with B UE support on handrails and supervision, step over step up and down with heavy UE use on the way down (01/01/2022); able to ascend/descend 4 steps in the clinic with B UE support on handrails and supervision, step over step up and down with heavy UE use on the way down. leads up with right foot step to gait (02/15/2022);  Goal status: In-Progress   6.  Patient will demonstrate ability to perform static balance in tandem stance for equal or greater than 30 seconds on each side, to demonstrate improved balance and decreased fall risk.  Baseline: right foot front 10 seconds, left foot front 7 seconds (07/10/2021); right foot front 4 seconds, left foot front 6 seconds (09/04/2021); right foot front 9 seconds, left foot front 31 seconds (10/04/2021); right foot front 6.5 seconds, left foot front 8.5 seconds (11/21/2021);  right foot front 8 seconds, left foot front 7 seconds (01/01/2022);  right foot front 9 seconds, left foot front 20 seconds (02/15/2022);  Goal status: In-Progress     PLAN: PT FREQUENCY: 2x/week   PT DURATION: 12 weeks   PLANNED INTERVENTIONS: Therapeutic exercises, Therapeutic activity, Neuromuscular re-education, Balance training, Gait training, Patient/Family education, Joint mobilization, Orthotic/Fit training, DME instructions, Dry Needling, Electrical stimulation, Cryotherapy, Moist heat, Splintting, Taping, and Manual therapy   PLAN FOR NEXT SESSION: continue with moderate intensity functional strengthening and balance exercises as able, address back and right glute pain to allow return to functional strengthening and balance with better tolerance.      Everlean Alstrom. Graylon Good, PT, DPT 03/01/22, 3:58 PM  [facility] [address] [place], [place] [address] P: [phone] I F: [phone]

## 2022-03-05 ENCOUNTER — Ambulatory Visit: Payer: Medicare Other | Admitting: Physical Therapy

## 2022-03-06 ENCOUNTER — Ambulatory Visit: Payer: Medicare Other | Admitting: Neurology

## 2022-03-06 ENCOUNTER — Encounter: Payer: Self-pay | Admitting: Physical Therapy

## 2022-03-06 ENCOUNTER — Ambulatory Visit: Payer: Medicare Other | Admitting: Physical Therapy

## 2022-03-06 DIAGNOSIS — R2689 Other abnormalities of gait and mobility: Secondary | ICD-10-CM | POA: Diagnosis not present

## 2022-03-06 DIAGNOSIS — Z9181 History of falling: Secondary | ICD-10-CM

## 2022-03-06 DIAGNOSIS — M6281 Muscle weakness (generalized): Secondary | ICD-10-CM

## 2022-03-06 NOTE — Therapy (Signed)
OUTPATIENT PHYSICAL THERAPY TREATMENT NOTE   Patient Name: [name] MRN: [id number] DOB:[birth year], 46 y.o., female Today's Date: 03/06/2022  PCP: Janine Limbo, PA-C REFERRING PROVIDER: Janine Limbo, PA-C  END OF SESSION:   PT End of Session - 03/06/22 1037     Visit Number 32    Number of Visits 49    Date for PT Re-Evaluation 03/26/22    Authorization Type [employer] MEDICARE/Medicaid reporting period from 02/15/2022    Progress Note Due on Visit 40    PT Start Time 1035    PT Stop Time 1113    PT Time Calculation (min) 38 min    Equipment Utilized During Treatment Gait belt    Activity Tolerance Patient limited by pain;Patient limited by fatigue    Behavior During Therapy [facility] for tasks assessed/performed                Past Medical History:  Diagnosis Date   Alcohol abuse    Anxiety    Anxiety    Anxiety and depression    Asthma    Bipolar disorder ([place])    Borderline personality disorder ([place])    Depression    Diabetes ([place])    Type 2    Dyslipidemia    Esophageal varices ([name])    Foot drop    right   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[place])    GERD (gastroesophageal reflux disease)    Gout    History of drug abuse ([place])    Insomnia    Muscular dystrophies ([place])    Neuropathy    Pancreatitis    Recovering alcoholic ([place])    Past Surgical History:  Procedure Laterality Date   HERNIA REPAIR     IR FLUORO GUIDE CV LINE RIGHT  12/30/2020   IR REMOVAL TUN CV CATH W/O FL  01/19/2021   IR US GUIDE VASC ACCESS RIGHT  12/30/2020   Patient Active Problem List   Diagnosis Date Noted   Frequent falls 02/12/2021   Leukocytosis    Aspiration pneumonia of both lower lobes due to gastric secretions ([place]) 01/04/2021   Pressure injury of skin 12/24/2020   Acute hepatic encephalopathy ([place]) 12/20/2020   Sepsis ([place]) 12/19/2020   Acute hyponatremia 12/18/2020   Alcohol use disorder, moderate, dependence ([place]) 12/18/2020   Hepatic steatosis  12/18/2020   Polysubstance abuse ([place]) 12/18/2020   Hyperglycemia due to type 2 diabetes mellitus ([place]) 12/18/2020   Hyperbilirubinemia 12/18/2020   Lactic acidosis 73/71/0626   Alcoholic liver disease ([place]) 12/18/2020   Bipolar 1 disorder, manic, moderate ([place]) 09/10/2018   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   Diabetes ([place]) 09/10/2018   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[place]) 03/28/2018   Chronic gout of foot 09/29/2015   Muscular dystrophy ([place]) 09/29/2015   Type 2 diabetes mellitus without complication ([place]) 94/85/4627   Anxiety and depression 10/01/2012   Protein-calorie malnutrition, severe ([place]) 09/30/2012   Alcohol abuse 09/26/2012   Trichomonas infection 09/26/2012   Dehydration 09/26/2012   Hypokalemia 11/14/2011   Thrombocytopenia ([place]) 11/14/2011   Alcohol withdrawal ([place]) 11/13/2011   Bipolar 2 disorder ([place]) 11/13/2011   Tobacco abuse 11/13/2011   Cocaine abuse ([place]) 11/13/2011   Gout 11/13/2011     REFERRING DIAG: bilateral leg weakness  THERAPY DIAG:  Other abnormalities of gait and mobility  Muscle weakness (generalized)  History of falling  Rationale for Evaluation and Treatment Rehabilitation  PERTINENT HISTORY: Patient is a 46 y.o. female who presents to outpatient physical therapy with a referral for  medical diagnosis bilateral leg weakness. This patient's chief complaints consist of difficulty with weakness and balance with history of frequent falling leading to the following functional deficits: difficulty with basic mobility including ambulation, stairs, transfers; difficulty with ADLs, IADLs. Currently requires assistance to navigate stairs, assistance with ADLs and  IADLs, and ambulates with RW when she previously did not need an assistive device. Relevant past medical history and comorbidities include muscular dystrophy (FSHD - facioscapulohumeral muscular dystrophy), anxiety and depression, bipolar 1 disorder, borderline personality  disorder, diabetes, GERD, gout, pancreatitis, alcoholic liver disease, tobacco abuse,  foot drop, neuropathy, recovering alcoholic, history of drug abuse, history of hernia repair.  Patient denies hx of cancer, stroke, seizures, lung problems, heart problems, unexplained weight loss, unexplained changes in bowel or bladder problems, unexplained stumbling or dropping things, osteoporosis, and spinal surgery  PRECAUTIONS: Fall, not drink alcohol.  SUBJECTIVE: Patient arrives with rollator. Patient reports her back pain was better until yesterday. R knee is doing great. R glute has been bothering her so it is hard to sit for a long time and she has had to get up and put biofreeze on it at night due to pain with laying on it. It feels like a bruise.   PAIN: NPRS 4/10 in low back.   OBJECTIVE     TODAY'S TREATMENT:  Manual therapy: to reduce pain and tissue tension, improve range of motion, neuromodulation, in order to promote improved ability to complete functional activities. PRONE - STM to B posterior lumbar paraspinals focusing on R >L, and right glute.    Modality: (unbilled) Dry needling performed to lumbar spine to decrease pain and spasms along patient's lumbar and right glute region with patient in prone utilizing 1 dry needle(s) .91m x 1056mwith 4 sticks at right and left L4-L5 lumbar multifidi. Patient educated about the risks and benefits from therapy and verbally consents to treatment.  Dry needling performed by SaEverlean AlstromSnGraylon GoodT, DPT who is certified in this technique.  Therapeutic exercise: to centralize symptoms and improve ROM, strength, muscular endurance, and activity tolerance required for successful completion of functional activities.  - sit <> stand from 18.5 inch plinth, 4x5 (see 5 times sit to stand test above). Needed intermittent B UE support.  - semi-narrow stance static balance on airex with head turns, 3x10 each direction.  Seated rest between 2nd and 3rd set.   -  standing AROM hip abduction with B UE support, 3x5 each side with standing rests between each set.  - seated good morning with hands on hip while sitting on high plinth, 3x5 with cuing for technique and good posture.   Pt required multimodal cuing for proper technique and to acilitate improved neuromuscular control, strength, range of motion, and functional ability resulting in improved performance and form. Requires CGA - SBA for balance exercises.     PATIENT EDUCATION:  Education details: form/technique with exercise. Person educated: Patient Education method: Explanation, demonstration, verbal cuing, tactile cuing. Education comprehension: verbalized understanding, demonstrated understanding, and needs further education     HOME EXERCISE PROGRAM: Access Code: [email or web address]: [email or web address] Date: 02/15/2022 Prepared by: SaRosita KeaExercises - Sit to Stand Without Arm Support  - 3 x weekly - 3 sets - 5 reps - Seated Toe Raise  - 3 x weekly - 3 sets - 15 reps - 1 second hold - Supine Lower Trunk Rotation  - 3 x weekly - 1 sets - 20 reps - 1-5 seconds hold - Prone Press Up  -  3 x weekly - 2 sets - 5 reps  HOME EXERCISE PROGRAM [[id number]]  Seated marching with weighted twist -  Repeat 10 Times, Hold 1 Second(s), Complete 3 Sets, Perform 1 Times a Day    ASSESSMENT:   CLINICAL IMPRESSION: Patient arrives with complaint of low back pain and right glute pain that had good response to dry needling at last PT session. Continued with dry needling/manual therapy followed by exercises for balance and functional strength to improve functional mobility and decrease fall risk. Reps per set kept low to prevent excessive fatigue. Patient continues to lack sufficient lateral hip strength to prevent trendelenburg. Patient encouraged to bring new AFOs to next session. Patient reported feeling significantly better by end of session. Patient would benefit from continued management of  limiting condition by skilled physical therapist to address remaining impairments and functional limitations to work towards stated goals and return to PLOF or maximal functional independence.     From PT Eval 07/10/2021:  Patient is a 46 y.o. female referred to outpatient physical therapy with a medical diagnosis of bilateral leg weakness who presents with signs and symptoms consistent with generalized weakness, balance deficits, and motor control problems likely related to deconditioning and FSHD muscular dystrophy. Patient presents with significant balance, motor control, posture, postural, gait, pain, muscle performance (strength/power/endurance) and activity tolerance impairments that are limiting ability to complete basic mobility and self care including ambulation, stairs, transfers, ADLs, and IADLs without difficulty. At eval requires assistance to navigate stairs, assistance with ADLs and  IADLs, and ambulates with RW when she previously did not need an assistive device. Patient would likely benefit from bilateral AFOs to address foot drop found at both ankles and improve her steppage gait. Patient will benefit from skilled physical therapy intervention to address current body structure impairments and activity limitations to improve function and work towards goals set in current POC in order to return to prior level of function or maximal functional improvement.   OBJECTIVE IMPAIRMENTS Abnormal gait, decreased activity tolerance, decreased balance, decreased coordination, decreased endurance, decreased mobility, difficulty walking, decreased ROM, decreased strength, impaired perceived functional ability, increased muscle spasms, impaired tone, impaired UE functional use, improper body mechanics, postural dysfunction, and pain.    ACTIVITY LIMITATIONS cleaning, community activity, driving, meal prep, laundry, and shopping.    PERSONAL FACTORS Behavior pattern, Fitness, Past/current experiences,  Time since onset of injury/illness/exacerbation, Transportation, and 3+ comorbidities:    muscular dystrophy (FSHD - facioscapulohumeral muscular dystrophy), anxiety and depression, bipolar 1 disorder, borderline personality disorder, diabetes, GERD, gout, pancreatitis, alcoholic liver disease, tobacco abuse,  foot drop, neuropathy, recovering alcoholic, history of drug abuse, history of hernia repair are also affecting patient's functional outcome.      REHAB POTENTIAL: Good   CLINICAL DECISION MAKING: Evolving/moderate complexity   EVALUATION COMPLEXITY: Moderate     GOALS: Goals reviewed with patient? No   SHORT TERM GOALS: Target date: 07/24/2021   Patient will be independent with initial home exercise program for self-management of symptoms. Baseline: Initial HEP to be provided at visit 2 as appropriate (07/10/21); Goal status: Met     LONG TERM GOALS: Target date: 10/02/2021; updated to 12/27/2021 for all unmet goals on 10/04/2021; updated to 03/26/2022 for all unmet goals on 01/01/2022;   Patient will be independent with a long-term home exercise program for self-management of symptoms.  Baseline: Initial HEP to be provided at visit 2 as appropriate (07/10/21); participating as tolerated, recently too much pain to participate (09/04/2021); participating regularly (10/04/2021);  participating as able (11/21/2021); not participating regularly over last couple of weeks (01/01/2022); no participating regularly since last progress note (02/15/2022);  Goal status: Ongoing   2.  Patient will demonstrate improved FOTO score by 10 points from baseline to demonstrate improvement in overall condition and self-reported functional ability.  Baseline: to be measured visit 2 as appropriate (07/10/21); 46 at visit # 2 (07/17/2021); 50 at visit # 10 (09/04/2021); 53 at visit #15 (10/04/2021); 50 at visit # 20 (11/21/2021); 52 at visit # 26 (01/01/2022); 50 at visit #30 (02/15/2022);  Goal status: In-Progress   3.   Patient will ambulate equal or greater than 1000 feet during 6 Min Walk Test using [facility] or less restrictive assistive device to improve community ambulation.  Baseline: To be tested visit 2 as appropriate (07/10/21); 310 feet with RW (07/17/2021); 556 feet with rollator (09/04/2021); 600 feet with rollator (10/04/2021); 300 feet with rollator - had to stop test at 3 min due to intolerable right glute pain (11/21/2021); 420 feet with rollator and steppage gait, R > L. Stopped at 5 min due to feeling of instability in low back to mid thoracic spine (01/01/2022); 573 feet with rollator and steppage gait, R > L. Occasional standing breaks. Low back pain increased to 3.5/10 (02/15/2022);  Goal status: In-Progress   4.  Patient will complete 5 Time Sit To Stand test in equal or less than 20 seconds with no UE support from 18.5 inch plinth to demonstrate improved LE functional strength and power for basic mobility and improved fall risk.  Baseline: 24 seconds with B UE on 18.5 inch plinth.  (07/10/21); 20 seconds with B UE on thighs from 18.5 inch plinth (09/04/2021); 19 seconds with B UE on thighs from 18.5 inch plinth (10/04/2021); 22 seconds with B UE on thighs from 18.5 inch plinth (11/21/2021); 20 seconds with B UE on thighs from 18.5 inch plinth (01/01/2022); 19 seconds without use of B UE support (02/15/2022);  Goal status: In-Progress   5.  Patient be able to ascend and descend stairs at her home mod I with use of B handrails.  Baseline: able to ascend/descend 4 steps in the clinic with B UE support on handrails and supervision - reports currently needs assistance to descend steps at home. (07/10/21); able to ascend/descend 4 steps in the clinic with B UE support on handrails and supervision, leading up and down with left. able to perform with reversed LE leads (09/04/2021); able to ascend/descend 4 steps in the clinic with B UE support on handrails and supervision, step over step with heavy UE use on the way down  (10/04/2021); able to ascend/descend 4 steps in the clinic with B UE support on handrails and supervision, step over step up and step to leading with R LE with heavy UE use on the way down (11/21/2021); ascend/descends 4 steps in the clinic with B UE support on handrails and supervision, step over step up and down with heavy UE use on the way down (01/01/2022); able to ascend/descend 4 steps in the clinic with B UE support on handrails and supervision, step over step up and down with heavy UE use on the way down. leads up with right foot step to gait (02/15/2022);  Goal status: In-Progress   6.  Patient will demonstrate ability to perform static balance in tandem stance for equal or greater than 30 seconds on each side, to demonstrate improved balance and decreased fall risk.  Baseline: right foot front 10 seconds, left foot  front 7 seconds (07/10/2021); right foot front 4 seconds, left foot front 6 seconds (09/04/2021); right foot front 9 seconds, left foot front 31 seconds (10/04/2021); right foot front 6.5 seconds, left foot front 8.5 seconds (11/21/2021); right foot front 8 seconds, left foot front 7 seconds (01/01/2022);  right foot front 9 seconds, left foot front 20 seconds (02/15/2022);  Goal status: In-Progress     PLAN: PT FREQUENCY: 2x/week   PT DURATION: 12 weeks   PLANNED INTERVENTIONS: Therapeutic exercises, Therapeutic activity, Neuromuscular re-education, Balance training, Gait training, Patient/Family education, Joint mobilization, Orthotic/Fit training, DME instructions, Dry Needling, Electrical stimulation, Cryotherapy, Moist heat, Splintting, Taping, and Manual therapy   PLAN FOR NEXT SESSION: continue with moderate intensity functional strengthening and balance exercises as able, address back and right glute pain to allow return to functional strengthening and balance with better tolerance.      Everlean Alstrom. Graylon Good, PT, DPT 03/06/22, 3:06 PM  [facility] [address] [place], [place] [address] P: [phone] I F: [phone]

## 2022-03-08 ENCOUNTER — Ambulatory Visit: Payer: Medicare Other | Admitting: Physical Therapy

## 2022-03-15 ENCOUNTER — Ambulatory Visit: Payer: Medicare Other | Admitting: Physical Therapy

## 2022-03-20 ENCOUNTER — Ambulatory Visit: Payer: 59 | Admitting: Physical Therapy

## 2022-03-22 ENCOUNTER — Encounter: Payer: Self-pay | Admitting: Physical Therapy

## 2022-03-22 ENCOUNTER — Ambulatory Visit: Payer: 59 | Attending: Internal Medicine | Admitting: Physical Therapy

## 2022-03-22 DIAGNOSIS — Z9181 History of falling: Secondary | ICD-10-CM | POA: Insufficient documentation

## 2022-03-22 DIAGNOSIS — M6281 Muscle weakness (generalized): Secondary | ICD-10-CM | POA: Insufficient documentation

## 2022-03-22 DIAGNOSIS — R2689 Other abnormalities of gait and mobility: Secondary | ICD-10-CM | POA: Insufficient documentation

## 2022-03-22 NOTE — Therapy (Signed)
OUTPATIENT PHYSICAL THERAPY TREATMENT NOTE   Patient Name: [name] MRN: [id number] DOB:12-27-1975, 47 y.o., female Today's Date: 03/22/2022  PCP: Janine Limbo, PA-C REFERRING PROVIDER: Janine Limbo, PA-C  END OF SESSION:   PT End of Session - 03/22/22 1521     Visit Number 33    Number of Visits 3    Date for PT Re-Evaluation 03/26/22    Authorization Type [employer] MEDICARE/Medicaid reporting period from 02/15/2022    Progress Note Due on Visit 40    PT Start Time 1515    PT Stop Time 1555    PT Time Calculation (min) 40 min    Equipment Utilized During Treatment Gait belt    Activity Tolerance Patient limited by pain;Patient limited by fatigue    Behavior During Therapy [facility] for tasks assessed/performed              Past Medical History:  Diagnosis Date   Alcohol abuse    Anxiety    Anxiety    Anxiety and depression    Asthma    Bipolar disorder ([place])    Borderline personality disorder ([place])    Depression    Diabetes ([place])    Type 2    Dyslipidemia    Esophageal varices ([name])    Foot drop    right   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[place])    GERD (gastroesophageal reflux disease)    Gout    History of drug abuse ([place])    Insomnia    Muscular dystrophies ([place])    Neuropathy    Pancreatitis    Recovering alcoholic ([place])    Past Surgical History:  Procedure Laterality Date   HERNIA REPAIR     IR FLUORO GUIDE CV LINE RIGHT  12/30/2020   IR REMOVAL TUN CV CATH W/O FL  01/19/2021   IR US GUIDE VASC ACCESS RIGHT  12/30/2020   Patient Active Problem List   Diagnosis Date Noted   Frequent falls 02/12/2021   Leukocytosis    Aspiration pneumonia of both lower lobes due to gastric secretions ([place]) 01/04/2021   Pressure injury of skin 12/24/2020   Acute hepatic encephalopathy ([place]) 12/20/2020   Sepsis ([place]) 12/19/2020   Acute hyponatremia 12/18/2020   Alcohol use disorder, moderate, dependence ([place]) 12/18/2020   Hepatic steatosis 12/18/2020    Polysubstance abuse ([place]) 12/18/2020   Hyperglycemia due to type 2 diabetes mellitus ([place]) 12/18/2020   Hyperbilirubinemia 12/18/2020   Lactic acidosis 85/04/7739   Alcoholic liver disease ([place]) 12/18/2020   Bipolar 1 disorder, manic, moderate ([place]) 09/10/2018   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   Diabetes ([place]) 09/10/2018   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[place]) 03/28/2018   Chronic gout of foot 09/29/2015   Muscular dystrophy ([place]) 09/29/2015   Type 2 diabetes mellitus without complication ([place]) 28/78/6767   Anxiety and depression 10/01/2012   Protein-calorie malnutrition, severe ([place]) 09/30/2012   Alcohol abuse 09/26/2012   Trichomonas infection 09/26/2012   Dehydration 09/26/2012   Hypokalemia 11/14/2011   Thrombocytopenia ([place]) 11/14/2011   Alcohol withdrawal ([place]) 11/13/2011   Bipolar 2 disorder ([place]) 11/13/2011   Tobacco abuse 11/13/2011   Cocaine abuse ([place]) 11/13/2011   Gout 11/13/2011     REFERRING DIAG: bilateral leg weakness  THERAPY DIAG:  Other abnormalities of gait and mobility  Muscle weakness (generalized)  History of falling  Rationale for Evaluation and Treatment Rehabilitation  PERTINENT HISTORY: Patient is a 47 y.o. female who presents to outpatient physical therapy with a referral for medical diagnosis  bilateral leg weakness. This patient's chief complaints consist of difficulty with weakness and balance with history of frequent falling leading to the following functional deficits: difficulty with basic mobility including ambulation, stairs, transfers; difficulty with ADLs, IADLs. Currently requires assistance to navigate stairs, assistance with ADLs and  IADLs, and ambulates with RW when she previously did not need an assistive device. Relevant past medical history and comorbidities include muscular dystrophy (FSHD - facioscapulohumeral muscular dystrophy), anxiety and depression, bipolar 1 disorder, borderline personality disorder, diabetes,  GERD, gout, pancreatitis, alcoholic liver disease, tobacco abuse,  foot drop, neuropathy, recovering alcoholic, history of drug abuse, history of hernia repair.  Patient denies hx of cancer, stroke, seizures, lung problems, heart problems, unexplained weight loss, unexplained changes in bowel or bladder problems, unexplained stumbling or dropping things, osteoporosis, and spinal surgery  PRECAUTIONS: Fall, not drink alcohol.  SUBJECTIVE: Patient arrives with rollator. Patient reports she has continued to have low back and R glute pain. She denies any falls. She states she feels the dry needling is helping and she would like to do that again today. She had some R knee pain for a while when she sat funny but as soon as she moved out of the position it felt better.   PAIN: NPRS 3/10 in low back.   OBJECTIVE    TODAY'S TREATMENT:  Therapeutic exercise: to centralize symptoms and improve ROM, strength, muscular endurance, and activity tolerance required for successful completion of functional activities.  - NuStep level 2 using bilateral upper and lower extremities. Seat/handle setting 9/9. For improved extremity mobility, muscular endurance, and activity tolerance; and to induce the analgesic effect of aerobic exercise, stimulate improved joint nutrition, and prepare body structures and systems for following interventions. x 5  minutes. Average SPM = 73. (Manual/dry needling - see below).  - sit <> stand from 18.5 inch plinth, 4x5, no UE support - standing AROM hip abduction with B UE support, 3x5 each side with standing rests between each set.   Manual therapy: to reduce pain and tissue tension, improve range of motion, neuromodulation, in order to promote improved ability to complete functional activities. PRONE - STM to B posterior lumbar paraspinals focusing on R >L, and right glute.    Modality: (unbilled) Dry needling performed to lumbar spine to decrease pain and spasms along patient's  lumbar and right glute region with patient in prone utilizing 2 dry needle(s) .31m x 1068mwith 4 sticks at right and left L4-L5 lumbar multifidi and 1 dry needle .3037m 100 mm with 1 stick at R piriformis. Patient educated about the risks and benefits from therapy and verbally consents to treatment.  Dry needling performed by SarEverlean AlstromnyGraylon Good, DPT who is certified in this technique.  Pt required multimodal cuing for proper technique and to acilitate improved neuromuscular control, strength, range of motion, and functional ability resulting in improved performance and form.     PATIENT EDUCATION:  Education details: form/technique with exercise. Person educated: Patient Education method: Explanation, demonstration, verbal cuing, tactile cuing. Education comprehension: verbalized understanding, demonstrated understanding, and needs further education     HOME EXERCISE PROGRAM: Access Code: [email or web address]: [email or web address] Date: 02/15/2022 Prepared by: SarRosita Keaxercises - Sit to Stand Without Arm Support  - 3 x weekly - 3 sets - 5 reps - Seated Toe Raise  - 3 x weekly - 3 sets - 15 reps - 1 second hold - Supine Lower Trunk Rotation  - 3 x weekly - 1 sets -  20 reps - 1-5 seconds hold - Prone Press Up  - 3 x weekly - 2 sets - 5 reps  HOME EXERCISE PROGRAM [[id number]]  Seated marching with weighted twist -  Repeat 10 Times, Hold 1 Second(s), Complete 3 Sets, Perform 1 Times a Day    ASSESSMENT:   CLINICAL IMPRESSION: Patient arrives with continued complaint of low back pain and right glute pain as well as overall feeling of achiness. She has been struggling to get to scheduled visits due to transportation issues. Continued today with dry needling and manual for pain relief. Continues to have ropey feeling musculature in right buttock region that is tender and relaxes some with sustained pressure. Continued to encourage patient to participate more in HEP but continued  similar exercises due to lack of regular participation at home. Patient continues to be limited by hip weakness and quick muscle fatigue. Patient would benefit from continued management of limiting condition by skilled physical therapist to address remaining impairments and functional limitations to work towards stated goals and return to PLOF or maximal functional independence.    From PT Eval 07/10/2021:  Patient is a 47 y.o. female referred to outpatient physical therapy with a medical diagnosis of bilateral leg weakness who presents with signs and symptoms consistent with generalized weakness, balance deficits, and motor control problems likely related to deconditioning and FSHD muscular dystrophy. Patient presents with significant balance, motor control, posture, postural, gait, pain, muscle performance (strength/power/endurance) and activity tolerance impairments that are limiting ability to complete basic mobility and self care including ambulation, stairs, transfers, ADLs, and IADLs without difficulty. At eval requires assistance to navigate stairs, assistance with ADLs and  IADLs, and ambulates with RW when she previously did not need an assistive device. Patient would likely benefit from bilateral AFOs to address foot drop found at both ankles and improve her steppage gait. Patient will benefit from skilled physical therapy intervention to address current body structure impairments and activity limitations to improve function and work towards goals set in current POC in order to return to prior level of function or maximal functional improvement.   OBJECTIVE IMPAIRMENTS Abnormal gait, decreased activity tolerance, decreased balance, decreased coordination, decreased endurance, decreased mobility, difficulty walking, decreased ROM, decreased strength, impaired perceived functional ability, increased muscle spasms, impaired tone, impaired UE functional use, improper body mechanics, postural dysfunction,  and pain.    ACTIVITY LIMITATIONS cleaning, community activity, driving, meal prep, laundry, and shopping.    PERSONAL FACTORS Behavior pattern, Fitness, Past/current experiences, Time since onset of injury/illness/exacerbation, Transportation, and 3+ comorbidities:    muscular dystrophy (FSHD - facioscapulohumeral muscular dystrophy), anxiety and depression, bipolar 1 disorder, borderline personality disorder, diabetes, GERD, gout, pancreatitis, alcoholic liver disease, tobacco abuse,  foot drop, neuropathy, recovering alcoholic, history of drug abuse, history of hernia repair are also affecting patient's functional outcome.      REHAB POTENTIAL: Good   CLINICAL DECISION MAKING: Evolving/moderate complexity   EVALUATION COMPLEXITY: Moderate     GOALS: Goals reviewed with patient? No   SHORT TERM GOALS: Target date: 07/24/2021   Patient will be independent with initial home exercise program for self-management of symptoms. Baseline: Initial HEP to be provided at visit 2 as appropriate (07/10/21); Goal status: Met     LONG TERM GOALS: Target date: 10/02/2021; updated to 12/27/2021 for all unmet goals on 10/04/2021; updated to 03/26/2022 for all unmet goals on 01/01/2022;   Patient will be independent with a long-term home exercise program for self-management of symptoms.  Baseline:  Initial HEP to be provided at visit 2 as appropriate (07/10/21); participating as tolerated, recently too much pain to participate (09/04/2021); participating regularly (10/04/2021); participating as able (11/21/2021); not participating regularly over last couple of weeks (01/01/2022); no participating regularly since last progress note (02/15/2022);  Goal status: Ongoing   2.  Patient will demonstrate improved FOTO score by 10 points from baseline to demonstrate improvement in overall condition and self-reported functional ability.  Baseline: to be measured visit 2 as appropriate (07/10/21); 46 at visit # 2  (07/17/2021); 50 at visit # 10 (09/04/2021); 53 at visit #15 (10/04/2021); 50 at visit # 20 (11/21/2021); 52 at visit # 26 (01/01/2022); 50 at visit #30 (02/15/2022);  Goal status: In-Progress   3.  Patient will ambulate equal or greater than 1000 feet during 6 Min Walk Test using [facility] or less restrictive assistive device to improve community ambulation.  Baseline: To be tested visit 2 as appropriate (07/10/21); 310 feet with RW (07/17/2021); 556 feet with rollator (09/04/2021); 600 feet with rollator (10/04/2021); 300 feet with rollator - had to stop test at 3 min due to intolerable right glute pain (11/21/2021); 420 feet with rollator and steppage gait, R > L. Stopped at 5 min due to feeling of instability in low back to mid thoracic spine (01/01/2022); 573 feet with rollator and steppage gait, R > L. Occasional standing breaks. Low back pain increased to 3.5/10 (02/15/2022);  Goal status: In-Progress   4.  Patient will complete 5 Time Sit To Stand test in equal or less than 20 seconds with no UE support from 18.5 inch plinth to demonstrate improved LE functional strength and power for basic mobility and improved fall risk.  Baseline: 24 seconds with B UE on 18.5 inch plinth.  (07/10/21); 20 seconds with B UE on thighs from 18.5 inch plinth (09/04/2021); 19 seconds with B UE on thighs from 18.5 inch plinth (10/04/2021); 22 seconds with B UE on thighs from 18.5 inch plinth (11/21/2021); 20 seconds with B UE on thighs from 18.5 inch plinth (01/01/2022); 19 seconds without use of B UE support (02/15/2022);  Goal status: In-Progress   5.  Patient be able to ascend and descend stairs at her home mod I with use of B handrails.  Baseline: able to ascend/descend 4 steps in the clinic with B UE support on handrails and supervision - reports currently needs assistance to descend steps at home. (07/10/21); able to ascend/descend 4 steps in the clinic with B UE support on handrails and supervision, leading up and down with left.  able to perform with reversed LE leads (09/04/2021); able to ascend/descend 4 steps in the clinic with B UE support on handrails and supervision, step over step with heavy UE use on the way down (10/04/2021); able to ascend/descend 4 steps in the clinic with B UE support on handrails and supervision, step over step up and step to leading with R LE with heavy UE use on the way down (11/21/2021); ascend/descends 4 steps in the clinic with B UE support on handrails and supervision, step over step up and down with heavy UE use on the way down (01/01/2022); able to ascend/descend 4 steps in the clinic with B UE support on handrails and supervision, step over step up and down with heavy UE use on the way down. leads up with right foot step to gait (02/15/2022);  Goal status: In-Progress   6.  Patient will demonstrate ability to perform static balance in tandem stance for equal or  greater than 30 seconds on each side, to demonstrate improved balance and decreased fall risk.  Baseline: right foot front 10 seconds, left foot front 7 seconds (07/10/2021); right foot front 4 seconds, left foot front 6 seconds (09/04/2021); right foot front 9 seconds, left foot front 31 seconds (10/04/2021); right foot front 6.5 seconds, left foot front 8.5 seconds (11/21/2021); right foot front 8 seconds, left foot front 7 seconds (01/01/2022);  right foot front 9 seconds, left foot front 20 seconds (02/15/2022);  Goal status: In-Progress     PLAN: PT FREQUENCY: 2x/week   PT DURATION: 12 weeks   PLANNED INTERVENTIONS: Therapeutic exercises, Therapeutic activity, Neuromuscular re-education, Balance training, Gait training, Patient/Family education, Joint mobilization, Orthotic/Fit training, DME instructions, Dry Needling, Electrical stimulation, Cryotherapy, Moist heat, Splintting, Taping, and Manual therapy   PLAN FOR NEXT SESSION: continue with moderate intensity functional strengthening and balance exercises as able, address back and  right glute pain to allow return to functional strengthening and balance with better tolerance.      Everlean Alstrom. Graylon Good, PT, DPT 03/22/22, 8:13 PM  [facility] [address] [place], [place] [address] P: [phone] I F: [phone]

## 2022-03-27 ENCOUNTER — Ambulatory Visit: Payer: 59 | Admitting: Physical Therapy

## 2022-03-27 ENCOUNTER — Encounter: Payer: Self-pay | Admitting: Physical Therapy

## 2022-03-27 DIAGNOSIS — M6281 Muscle weakness (generalized): Secondary | ICD-10-CM

## 2022-03-27 DIAGNOSIS — R2689 Other abnormalities of gait and mobility: Secondary | ICD-10-CM | POA: Diagnosis not present

## 2022-03-27 DIAGNOSIS — Z9181 History of falling: Secondary | ICD-10-CM

## 2022-03-27 NOTE — Therapy (Signed)
OUTPATIENT PHYSICAL THERAPY TREATMENT NOTE  / PROGRESS NOTE / RE-CERTIFICATION Dates of reporting from 02/15/2022 to 03/27/2022  Patient Name: [name] MRN: [id number] DOB:03-03-1976, 47 y.o., female Today's Date: 03/27/2022  PCP: Eunice Blase, PA-C REFERRING PROVIDER: Eunice Blase, PA-C  END OF SESSION:   PT End of Session - 03/27/22 1616     Visit Number 34    Number of Visits 49    Date for PT Re-Evaluation 06/19/22    Authorization Type [employer] MEDICARE/Medicaid reporting period from 02/15/2022    Progress Note Due on Visit 40    PT Start Time 1605    PT Stop Time 1645    PT Time Calculation (min) 40 min    Activity Tolerance Patient limited by pain;Patient limited by fatigue    Behavior During Therapy [facility] for tasks assessed/performed             Past Medical History:  Diagnosis Date   Alcohol abuse    Anxiety    Anxiety    Anxiety and depression    Asthma    Bipolar disorder ([name])    Borderline personality disorder ([name])    Depression    Diabetes ([name])    Type 2    Dyslipidemia    Esophageal varices ([name])    Foot drop    right   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[name])    GERD (gastroesophageal reflux disease)    Gout    History of drug abuse ([name])    Insomnia    Muscular dystrophies ([name])    Neuropathy    Pancreatitis    Recovering alcoholic ([name])    Past Surgical History:  Procedure Laterality Date   HERNIA REPAIR     IR FLUORO GUIDE CV LINE RIGHT  12/30/2020   IR REMOVAL TUN CV CATH W/O FL  01/19/2021   IR US GUIDE VASC ACCESS RIGHT  12/30/2020   Patient Active Problem List   Diagnosis Date Noted   Frequent falls 02/12/2021   Leukocytosis    Aspiration pneumonia of both lower lobes due to gastric secretions ([name]) 01/04/2021   Pressure injury of skin 12/24/2020   Acute hepatic encephalopathy ([name]) 12/20/2020   Sepsis ([name]) 12/19/2020   Acute hyponatremia 12/18/2020   Alcohol use disorder, moderate, dependence ([name]) 12/18/2020    Hepatic steatosis 12/18/2020   Polysubstance abuse ([name]) 12/18/2020   Hyperglycemia due to type 2 diabetes mellitus ([name]) 12/18/2020   Hyperbilirubinemia 12/18/2020   Lactic acidosis 12/18/2020   Alcoholic liver disease ([name]) 12/18/2020   Bipolar 1 disorder, manic, moderate ([name]) 09/10/2018   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   Diabetes ([name]) 09/10/2018   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   Chronic gout of foot 09/29/2015   Muscular dystrophy ([name]) 09/29/2015   Type 2 diabetes mellitus without complication ([name]) 09/29/2015   Anxiety and depression 10/01/2012   Protein-calorie malnutrition, severe ([name]) 09/30/2012   Alcohol abuse 09/26/2012   Trichomonas infection 09/26/2012   Dehydration 09/26/2012   Hypokalemia 11/14/2011   Thrombocytopenia ([name]) 11/14/2011   Alcohol withdrawal ([name]) 11/13/2011   Bipolar 2 disorder ([name]) 11/13/2011   Tobacco abuse 11/13/2011   Cocaine abuse ([name]) 11/13/2011   Gout 11/13/2011     REFERRING DIAG: bilateral leg weakness  THERAPY DIAG:  Other abnormalities of gait and mobility - Plan: PT plan of care cert/re-cert  Muscle weakness (generalized) - Plan: PT plan of care cert/re-cert  History of falling - Plan: PT plan of care cert/re-cert  Rationale for Evaluation and  Treatment Rehabilitation  PERTINENT HISTORY: Patient is a 47 y.o. female who presents to outpatient physical therapy with a referral for medical diagnosis bilateral leg weakness. This patient's chief complaints consist of difficulty with weakness and balance with history of frequent falling leading to the following functional deficits: difficulty with basic mobility including ambulation, stairs, transfers; difficulty with ADLs, IADLs. Currently requires assistance to navigate stairs, assistance with ADLs and  IADLs, and ambulates with RW when she previously did not need an assistive device. Relevant past medical history and comorbidities include muscular  dystrophy (FSHD - facioscapulohumeral muscular dystrophy), anxiety and depression, bipolar 1 disorder, borderline personality disorder, diabetes, GERD, gout, pancreatitis, alcoholic liver disease, tobacco abuse,  foot drop, neuropathy, recovering alcoholic, history of drug abuse, history of hernia repair.  Patient denies hx of cancer, stroke, seizures, lung problems, heart problems, unexplained weight loss, unexplained changes in bowel or bladder problems, unexplained stumbling or dropping things, osteoporosis, and spinal surgery  PRECAUTIONS: Fall, not drink alcohol.  SUBJECTIVE: Patient arrives with rollator. She reports she continues to have low back pain and right glute pain. She states she did sit to stand exercise and toe raises from her HEP since last PT session. She feels like PT is helping her. She feels more sure on her feet  PAIN: NPRS 2/10 in low back and right glute.   OBJECTIVE   SELF-REPORTED FUNCTION FOTO score: 54/100 (NOC-Neuromuscular disorder questionnaire)   FUNCTIONAL/BALANCE TESTS: 6 Minute Walk Test: 381 feet with rollator and steppage gait, R > L. Stopped at 4:30 min due to pain in low back and right glute.    Five Time Sit to Stand (5TSTS): 21 seconds with no UE support from 18.5 inch plinth.    Static balance (best of 2 trials): tandem stance, eyes open: right foot front 30 seconds, left foot front 45 seconds.    Stairs: able to ascend/descend 4 steps in the clinic with B UE support on handrails and supervision, step over step up and down with heavy UE use on the way down.     TODAY'S TREATMENT:  Therapeutic exercise: to centralize symptoms and improve ROM, strength, muscular endurance, and activity tolerance required for successful completion of functional activities.  - testing to assess progress (see above).   Manual therapy: to reduce pain and tissue tension, improve range of motion, neuromodulation, in order to promote improved ability to complete  functional activities. PRONE - STM to B posterior lumbar paraspinals focusing on R >L, and right glute.    Pt required multimodal cuing for proper technique and to acilitate improved neuromuscular control, strength, range of motion, and functional ability resulting in improved performance and form.     PATIENT EDUCATION:  Education details: form/technique with exercise. Person educated: Patient Education method: Explanation, demonstration, verbal cuing, tactile cuing. Education comprehension: verbalized understanding, demonstrated understanding, and needs further education     HOME EXERCISE PROGRAM: Access Code: [email or web address] URL: [email or web address] Date: 02/15/2022 Prepared by: Norton Blizzard  Exercises - Sit to Stand Without Arm Support  - 3 x weekly - 3 sets - 5 reps - Seated Toe Raise  - 3 x weekly - 3 sets - 15 reps - 1 second hold - Supine Lower Trunk Rotation  - 3 x weekly - 1 sets - 20 reps - 1-5 seconds hold - Prone Press Up  - 3 x weekly - 2 sets - 5 reps  HOME EXERCISE PROGRAM [[id number]]  Seated marching with weighted twist -  Repeat 10 Times, Hold 1 Second(s),  Complete 3 Sets, Perform 1 Times a Day    ASSESSMENT:   CLINICAL IMPRESSION: Patient has attended 34 physical therapy session since starting current episode of care on 07/10/2021. Since starting this episode of care, she has improved in her FOTO score (self-reported balance questionnaire), tandem stance hold time (met goal today), and 5 Time Sit To Stand test. She has had difficulty with regular attendance due to transportation limitations and illness at times. Her participation in strengthening exercises is limited by muscular dystrophy and low back and right glute/posterior hip pain that appear to be related. Patient has decreased how often she falls over the last few weeks and improved drastically on her tandem stance balance since last progress note. Continued PT is medically necessary to help preserve  her functional independence and maximize her mobility for as long as possible in the setting of a progressive degenerative disease. She may benefit from further medical evaluation of her low back and right hip/glute pain as it continues to limit her participation and quality of life. PT has helped with pain control but she may benefit from consideration of other treatments that may further improve pain. Plan to continue with PT focusing on improving quality of life, function, and safety for 12 more weeks at a rate of 2x per week. Patient would benefit from continued management of limiting condition by skilled physical therapist to address remaining impairments and functional limitations to work towards stated goals and return to PLOF or maximal functional independence.    From PT Eval 07/10/2021:  Patient is a 47 y.o. female referred to outpatient physical therapy with a medical diagnosis of bilateral leg weakness who presents with signs and symptoms consistent with generalized weakness, balance deficits, and motor control problems likely related to deconditioning and FSHD muscular dystrophy. Patient presents with significant balance, motor control, posture, postural, gait, pain, muscle performance (strength/power/endurance) and activity tolerance impairments that are limiting ability to complete basic mobility and self care including ambulation, stairs, transfers, ADLs, and IADLs without difficulty. At eval requires assistance to navigate stairs, assistance with ADLs and  IADLs, and ambulates with RW when she previously did not need an assistive device. Patient would likely benefit from bilateral AFOs to address foot drop found at both ankles and improve her steppage gait. Patient will benefit from skilled physical therapy intervention to address current body structure impairments and activity limitations to improve function and work towards goals set in current POC in order to return to prior level of function  or maximal functional improvement.   OBJECTIVE IMPAIRMENTS Abnormal gait, decreased activity tolerance, decreased balance, decreased coordination, decreased endurance, decreased mobility, difficulty walking, decreased ROM, decreased strength, impaired perceived functional ability, increased muscle spasms, impaired tone, impaired UE functional use, improper body mechanics, postural dysfunction, and pain.    ACTIVITY LIMITATIONS cleaning, community activity, driving, meal prep, laundry, and shopping.    PERSONAL FACTORS Behavior pattern, Fitness, Past/current experiences, Time since onset of injury/illness/exacerbation, Transportation, and 3+ comorbidities:    muscular dystrophy (FSHD - facioscapulohumeral muscular dystrophy), anxiety and depression, bipolar 1 disorder, borderline personality disorder, diabetes, GERD, gout, pancreatitis, alcoholic liver disease, tobacco abuse,  foot drop, neuropathy, recovering alcoholic, history of drug abuse, history of hernia repair are also affecting patient's functional outcome.      REHAB POTENTIAL: Good   CLINICAL DECISION MAKING: Evolving/moderate complexity   EVALUATION COMPLEXITY: Moderate     GOALS: Goals reviewed with patient? No   SHORT TERM GOALS: Target date: 07/24/2021   Patient will be  independent with initial home exercise program for self-management of symptoms. Baseline: Initial HEP to be provided at visit 2 as appropriate (07/10/21); Goal status: Met     LONG TERM GOALS: Target date: 10/02/2021; updated to 12/27/2021 for all unmet goals on 10/04/2021; updated to 03/26/2022 for all unmet goals on 01/01/2022; updated to 06/19/2022 for all unmet goals on 03/27/2022.    Patient will be independent with a long-term home exercise program for self-management of symptoms.  Baseline: Initial HEP to be provided at visit 2 as appropriate (07/10/21); participating as tolerated, recently too much pain to participate (09/04/2021); participating regularly  (10/04/2021); participating as able (11/21/2021); not participating regularly over last couple of weeks (01/01/2022); no participating regularly since last progress note (02/15/2022); improving participation (03/27/2022);  Goal status: Ongoing   2.  Patient will demonstrate improved FOTO score by 10 points from baseline to demonstrate improvement in overall condition and self-reported functional ability.  Baseline: to be measured visit 2 as appropriate (07/10/21); 47 at visit # 2 (07/17/2021); 50 at visit # 10 (09/04/2021); 53 at visit #15 (10/04/2021); 50 at visit # 20 (11/21/2021); 52 at visit # 26 (01/01/2022); 50 at visit #30 (02/15/2022); 54 at visit # 34 (03/27/22);  Goal status: In-Progress   3.  Patient will ambulate equal or greater than 1000 feet during 6 Min Walk Test using [facility] or less restrictive assistive device to improve community ambulation.  Baseline: To be tested visit 2 as appropriate (07/10/21); 310 feet with RW (07/17/2021); 556 feet with rollator (09/04/2021); 600 feet with rollator (10/04/2021); 300 feet with rollator - had to stop test at 3 min due to intolerable right glute pain (11/21/2021); 420 feet with rollator and steppage gait, R > L. Stopped at 5 min due to feeling of instability in low back to mid thoracic spine (01/01/2022); 573 feet with rollator and steppage gait, R > L. Occasional standing breaks. Low back pain increased to 3.5/10 (02/15/2022);  381 feet with rollator and steppage gait, R > L. Stopped at 4:30 min due to pain in low back and right glute (03/27/2022);  Goal status: In-Progress   4.  Patient will complete 5 Time Sit To Stand test in equal or less than 20 seconds with no UE support from 18.5 inch plinth to demonstrate improved LE functional strength and power for basic mobility and improved fall risk.  Baseline: 24 seconds with B UE on 18.5 inch plinth.  (07/10/21); 20 seconds with B UE on thighs from 18.5 inch plinth (09/04/2021); 19 seconds with B UE on thighs from 18.5 inch  plinth (10/04/2021); 22 seconds with B UE on thighs from 18.5 inch plinth (11/21/2021); 20 seconds with B UE on thighs from 18.5 inch plinth (01/01/2022); 19 seconds without use of B UE support (02/15/2022); 21 seconds with no UE support from 18.5 inch plinth (03/27/2022);      5.  Patient be able to ascend and descend stairs at her home mod I with use of B handrails.  Baseline: able to ascend/descend 4 steps in the clinic with B UE support on handrails and supervision - reports currently needs assistance to descend steps at home. (07/10/21); able to ascend/descend 4 steps in the clinic with B UE support on handrails and supervision, leading up and down with left. able to perform with reversed LE leads (09/04/2021); able to ascend/descend 4 steps in the clinic with B UE support on handrails and supervision, step over step with heavy UE use on the way down (10/04/2021); able  to ascend/descend 4 steps in the clinic with B UE support on handrails and supervision, step over step up and step to leading with R LE with heavy UE use on the way down (11/21/2021); ascend/descends 4 steps in the clinic with B UE support on handrails and supervision, step over step up and down with heavy UE use on the way down (01/01/2022); able to ascend/descend 4 steps in the clinic with B UE support on handrails and supervision, step over step up and down with heavy UE use on the way down. leads up with right foot step to gait (02/15/2022; 03/27/22);  Goal status: MET   6.  Patient will demonstrate ability to perform static balance in tandem stance for equal or greater than 30 seconds on each side, to demonstrate improved balance and decreased fall risk.  Baseline: right foot front 10 seconds, left foot front 7 seconds (07/10/2021); right foot front 4 seconds, left foot front 6 seconds (09/04/2021); right foot front 9 seconds, left foot front 31 seconds (10/04/2021); right foot front 6.5 seconds, left foot front 8.5 seconds (11/21/2021); right foot  front 8 seconds, left foot front 7 seconds (01/01/2022);  right foot front 9 seconds, left foot front 20 seconds (02/15/2022); right foot front 30 seconds, left foot front 45 seconds (03/27/2022);  Goal status: MET 03/27/22     PLAN: PT FREQUENCY: 2x/week   PT DURATION: 12 weeks   PLANNED INTERVENTIONS: Therapeutic exercises, Therapeutic activity, Neuromuscular re-education, Balance training, Gait training, Patient/Family education, Joint mobilization, Orthotic/Fit training, DME instructions, Dry Needling, Electrical stimulation, Cryotherapy, Moist heat, Splintting, Taping, and Manual therapy   PLAN FOR NEXT SESSION: continue with moderate intensity functional strengthening and balance exercises as able, address back and right glute pain to allow return to functional strengthening and balance with better tolerance.      [name]. Ilsa Iha, PT, DPT 03/27/22, 5:54 PM  [facility] [address] [place], [place] [address] P: [phone] I F: [phone]

## 2022-03-29 ENCOUNTER — Ambulatory Visit: Payer: 59 | Admitting: Physical Therapy

## 2022-03-29 ENCOUNTER — Encounter: Payer: Self-pay | Admitting: Physical Therapy

## 2022-03-29 DIAGNOSIS — R2689 Other abnormalities of gait and mobility: Secondary | ICD-10-CM | POA: Diagnosis not present

## 2022-03-29 DIAGNOSIS — M6281 Muscle weakness (generalized): Secondary | ICD-10-CM

## 2022-03-29 DIAGNOSIS — Z9181 History of falling: Secondary | ICD-10-CM

## 2022-03-29 NOTE — Therapy (Signed)
OUTPATIENT PHYSICAL THERAPY TREATMENT NOTE   Patient Name: [name] MRN: [id number] DOB:04/16/1975, 47 y.o., female Today's Date: 03/29/2022  PCP: Janine Limbo, PA-C REFERRING PROVIDER: Janine Limbo, PA-C  END OF SESSION:   PT End of Session - 03/29/22 1534     Visit Number 35    Number of Visits 4    Date for PT Re-Evaluation 06/19/22    Authorization Type [employer] MEDICARE/Medicaid reporting period from 03/27/2022    Progress Note Due on Visit 40    PT Start Time 1520    PT Stop Time 1558    PT Time Calculation (min) 38 min    Activity Tolerance Patient limited by pain;Patient limited by fatigue    Behavior During Therapy [facility] for tasks assessed/performed             Past Medical History:  Diagnosis Date   Alcohol abuse    Anxiety    Anxiety    Anxiety and depression    Asthma    Bipolar disorder ([place])    Borderline personality disorder ([place])    Depression    Diabetes ([place])    Type 2    Dyslipidemia    Esophageal varices ([name])    Foot drop    right   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[place])    GERD (gastroesophageal reflux disease)    Gout    History of drug abuse ([place])    Insomnia    Muscular dystrophies ([place])    Neuropathy    Pancreatitis    Recovering alcoholic ([place])    Past Surgical History:  Procedure Laterality Date   HERNIA REPAIR     IR FLUORO GUIDE CV LINE RIGHT  12/30/2020   IR REMOVAL TUN CV CATH W/O FL  01/19/2021   IR US GUIDE VASC ACCESS RIGHT  12/30/2020   Patient Active Problem List   Diagnosis Date Noted   Frequent falls 02/12/2021   Leukocytosis    Aspiration pneumonia of both lower lobes due to gastric secretions ([place]) 01/04/2021   Pressure injury of skin 12/24/2020   Acute hepatic encephalopathy ([place]) 12/20/2020   Sepsis ([place]) 12/19/2020   Acute hyponatremia 12/18/2020   Alcohol use disorder, moderate, dependence (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[place]) 03/28/2018   Chronic gout of foot 09/29/2015   Muscular dystrophy ([place]) 09/29/2015   Type 2 diabetes mellitus without complication ([place]) 36/14/4315   Anxiety and depression 10/01/2012   Protein-calorie malnutrition, severe ([place]) 09/30/2012   Alcohol abuse 09/26/2012   Trichomonas infection 09/26/2012   Dehydration 09/26/2012   Hypokalemia 11/14/2011   Thrombocytopenia ([place]) 11/14/2011   Alcohol withdrawal ([place]) 11/13/2011   Bipolar 2 disorder ([place]) 11/13/2011   Tobacco abuse 11/13/2011   Cocaine abuse ([place]) 11/13/2011   Gout 11/13/2011     REFERRING DIAG: bilateral leg weakness  THERAPY DIAG:  Other abnormalities of gait and mobility  Muscle weakness (generalized)  History of falling  Rationale for Evaluation and Treatment Rehabilitation  PERTINENT HISTORY: Patient is a 47 y.o. female who presents to outpatient physical therapy with a referral for medical diagnosis bilateral leg weakness. This patient's chief complaints consist of difficulty  with weakness and balance with history of frequent falling leading to the following functional deficits: difficulty with basic mobility including ambulation, stairs, transfers; difficulty with ADLs, IADLs. Currently requires assistance to navigate stairs, assistance with ADLs and  IADLs, and ambulates with RW when she previously did not need an assistive device. Relevant past medical history and comorbidities include muscular dystrophy (FSHD - facioscapulohumeral muscular dystrophy), anxiety and depression, bipolar 1 disorder, borderline personality disorder, diabetes, GERD, gout, pancreatitis, alcoholic liver disease,  tobacco abuse,  foot drop, neuropathy, recovering alcoholic, history of drug abuse, history of hernia repair.  Patient denies hx of cancer, stroke, seizures, lung problems, heart problems, unexplained weight loss, unexplained changes in bowel or bladder problems, unexplained stumbling or dropping things, osteoporosis, and spinal surgery  PRECAUTIONS: Fall, not drink alcohol.  SUBJECTIVE: Patient arrives with rollator. She right glute region bothered her a lot since last PT session. Patient states she has a goal is 4x10 sit <> stands on Saturday before coming back to PT.   PAIN: NPRS 2/10 in low back and right glute.   OBJECTIVE  TODAY'S TREATMENT:  Therapeutic exercise: to centralize symptoms and improve ROM, strength, muscular endurance, and activity tolerance required for successful completion of functional activities.  - NuStep level 3 using bilateral upper and lower extremities. Seat/handle setting 9/9. For improved extremity mobility, muscular endurance, and activity tolerance; and to induce the analgesic effect of aerobic exercise, stimulate improved joint nutrition, and prepare body structures and systems for following interventions. x 6  minutes. Average SPM = 77. RPE 4-5/10. - sit <> stand from 17 inch chair, 4x5, no UE support (RPE ranged from 3-5) - lateral step back and forth over 4 inch smaller aerobic step with heavy  B UE support, 1x10 each direction (RPE 8/10). 1x5 (RPE 8-9/10, pain in right glute); 1x3  (RPE 7.5-8/10). Pain in R glute with push off.   Manual therapy: to reduce pain and tissue tension, improve range of motion, neuromodulation, in order to promote improved ability to complete functional activities. PRONE - STM to right glute and deep posterior hip muscles as tolerated.    Pt required multimodal cuing for proper technique and to acilitate improved neuromuscular control, strength, range of motion, and functional ability resulting in improved performance and form.      PATIENT EDUCATION:  Education details: form/technique with exercise. Person educated: Patient Education method: Explanation, demonstration, verbal cuing, tactile cuing. Education comprehension: verbalized understanding, demonstrated understanding, and needs further education     HOME EXERCISE PROGRAM: Access Code: [email or web address] URL: [email or web address] Date: 02/15/2022 Prepared by: Norton Blizzard  Exercises - Sit to Stand Without Arm Support  - 3 x weekly - 3 sets - 5 reps - Seated Toe Raise  - 3 x weekly - 3 sets - 15 reps - 1 second hold - Supine Lower Trunk Rotation  - 3 x weekly - 1 sets - 20 reps - 1-5 seconds hold - Prone Press Up  - 3 x weekly - 2 sets - 5 reps  HOME EXERCISE PROGRAM [[id number]]  Seated marching with weighted twist -  Repeat 10 Times, Hold 1 Second(s), Complete 3 Sets, Perform 1 Times a Day    ASSESSMENT:   CLINICAL IMPRESSION: Patient arrives with continued complaint of right glute and low back pain and did not participate in HEP since last PT session. RPE was monitored this session and initially exercises were in the light to moderate range until the end of the session when she rated even a few  reps of exercise as hard activity. Continued with functional and LE strengthening to improve balance and function as well as manual therapy for pain control to improve function. Patient continues to require frequent rests and has limited exercise capacity related to muscular dystrophy. Patient encouraged to participate in HEP more often and goal setting activity was performed to help her improve participation. Patient would benefit from continued management of limiting condition by skilled physical therapist to address remaining impairments and functional limitations to work towards stated goals and return to PLOF or maximal functional independence.    From PT Eval 07/10/2021:  Patient is a 47 y.o. female referred to outpatient physical therapy with a medical  diagnosis of bilateral leg weakness who presents with signs and symptoms consistent with generalized weakness, balance deficits, and motor control problems likely related to deconditioning and FSHD muscular dystrophy. Patient presents with significant balance, motor control, posture, postural, gait, pain, muscle performance (strength/power/endurance) and activity tolerance impairments that are limiting ability to complete basic mobility and self care including ambulation, stairs, transfers, ADLs, and IADLs without difficulty. At eval requires assistance to navigate stairs, assistance with ADLs and  IADLs, and ambulates with RW when she previously did not need an assistive device. Patient would likely benefit from bilateral AFOs to address foot drop found at both ankles and improve her steppage gait. Patient will benefit from skilled physical therapy intervention to address current body structure impairments and activity limitations to improve function and work towards goals set in current POC in order to return to prior level of function or maximal functional improvement.   OBJECTIVE IMPAIRMENTS Abnormal gait, decreased activity tolerance, decreased balance, decreased coordination, decreased endurance, decreased mobility, difficulty walking, decreased ROM, decreased strength, impaired perceived functional ability, increased muscle spasms, impaired tone, impaired UE functional use, improper body mechanics, postural dysfunction, and pain.    ACTIVITY LIMITATIONS cleaning, community activity, driving, meal prep, laundry, and shopping.    PERSONAL FACTORS Behavior pattern, Fitness, Past/current experiences, Time since onset of injury/illness/exacerbation, Transportation, and 3+ comorbidities:    muscular dystrophy (FSHD - facioscapulohumeral muscular dystrophy), anxiety and depression, bipolar 1 disorder, borderline personality disorder, diabetes, GERD, gout, pancreatitis, alcoholic liver disease, tobacco abuse,   foot drop, neuropathy, recovering alcoholic, history of drug abuse, history of hernia repair are also affecting patient's functional outcome.      REHAB POTENTIAL: Good   CLINICAL DECISION MAKING: Evolving/moderate complexity   EVALUATION COMPLEXITY: Moderate     GOALS: Goals reviewed with patient? No   SHORT TERM GOALS: Target date: 07/24/2021   Patient will be independent with initial home exercise program for self-management of symptoms. Baseline: Initial HEP to be provided at visit 2 as appropriate (07/10/21); Goal status: Met     LONG TERM GOALS: Target date: 10/02/2021; updated to 12/27/2021 for all unmet goals on 10/04/2021; updated to 03/26/2022 for all unmet goals on 01/01/2022; updated to 06/19/2022 for all unmet goals on 03/27/2022.    Patient will be independent with a long-term home exercise program for self-management of symptoms.  Baseline: Initial HEP to be provided at visit 2 as appropriate (07/10/21); participating as tolerated, recently too much pain to participate (09/04/2021); participating regularly (10/04/2021); participating as able (11/21/2021); not participating regularly over last couple of weeks (01/01/2022); no participating regularly since last progress note (02/15/2022); improving participation (03/27/2022);  Goal status: Ongoing   2.  Patient will demonstrate improved FOTO score by 10 points from baseline to demonstrate improvement in overall condition and self-reported functional ability.  Baseline:  to be measured visit 2 as appropriate (07/10/21); 47 at visit # 2 (07/17/2021); 50 at visit # 10 (09/04/2021); 53 at visit #15 (10/04/2021); 50 at visit # 20 (11/21/2021); 52 at visit # 26 (01/01/2022); 50 at visit #30 (02/15/2022); 54 at visit # 34 (03/27/22);  Goal status: In-Progress   3.  Patient will ambulate equal or greater than 1000 feet during 6 Min Walk Test using [facility] or less restrictive assistive device to improve community ambulation.  Baseline: To be tested visit 2 as  appropriate (07/10/21); 310 feet with RW (07/17/2021); 556 feet with rollator (09/04/2021); 600 feet with rollator (10/04/2021); 300 feet with rollator - had to stop test at 3 min due to intolerable right glute pain (11/21/2021); 420 feet with rollator and steppage gait, R > L. Stopped at 5 min due to feeling of instability in low back to mid thoracic spine (01/01/2022); 573 feet with rollator and steppage gait, R > L. Occasional standing breaks. Low back pain increased to 3.5/10 (02/15/2022);  381 feet with rollator and steppage gait, R > L. Stopped at 4:30 min due to pain in low back and right glute (03/27/2022);  Goal status: In-Progress   4.  Patient will complete 5 Time Sit To Stand test in equal or less than 20 seconds with no UE support from 18.5 inch plinth to demonstrate improved LE functional strength and power for basic mobility and improved fall risk.  Baseline: 24 seconds with B UE on 18.5 inch plinth.  (07/10/21); 20 seconds with B UE on thighs from 18.5 inch plinth (09/04/2021); 19 seconds with B UE on thighs from 18.5 inch plinth (10/04/2021); 22 seconds with B UE on thighs from 18.5 inch plinth (11/21/2021); 20 seconds with B UE on thighs from 18.5 inch plinth (01/01/2022); 19 seconds without use of B UE support (02/15/2022); 21 seconds with no UE support from 18.5 inch plinth (03/27/2022);      5.  Patient be able to ascend and descend stairs at her home mod I with use of B handrails.  Baseline: able to ascend/descend 4 steps in the clinic with B UE support on handrails and supervision - reports currently needs assistance to descend steps at home. (07/10/21); able to ascend/descend 4 steps in the clinic with B UE support on handrails and supervision, leading up and down with left. able to perform with reversed LE leads (09/04/2021); able to ascend/descend 4 steps in the clinic with B UE support on handrails and supervision, step over step with heavy UE use on the way down (10/04/2021); able to  ascend/descend 4 steps in the clinic with B UE support on handrails and supervision, step over step up and step to leading with R LE with heavy UE use on the way down (11/21/2021); ascend/descends 4 steps in the clinic with B UE support on handrails and supervision, step over step up and down with heavy UE use on the way down (01/01/2022); able to ascend/descend 4 steps in the clinic with B UE support on handrails and supervision, step over step up and down with heavy UE use on the way down. leads up with right foot step to gait (02/15/2022; 03/27/22);  Goal status: MET   6.  Patient will demonstrate ability to perform static balance in tandem stance for equal or greater than 30 seconds on each side, to demonstrate improved balance and decreased fall risk.  Baseline: right foot front 10 seconds, left foot front 7 seconds (07/10/2021); right foot front 4 seconds, left  foot front 6 seconds (09/04/2021); right foot front 9 seconds, left foot front 31 seconds (10/04/2021); right foot front 6.5 seconds, left foot front 8.5 seconds (11/21/2021); right foot front 8 seconds, left foot front 7 seconds (01/01/2022);  right foot front 9 seconds, left foot front 20 seconds (02/15/2022); right foot front 30 seconds, left foot front 45 seconds (03/27/2022);  Goal status: MET 03/27/22     PLAN: PT FREQUENCY: 2x/week   PT DURATION: 12 weeks   PLANNED INTERVENTIONS: Therapeutic exercises, Therapeutic activity, Neuromuscular re-education, Balance training, Gait training, Patient/Family education, Joint mobilization, Orthotic/Fit training, DME instructions, Dry Needling, Electrical stimulation, Cryotherapy, Moist heat, Splintting, Taping, and Manual therapy   PLAN FOR NEXT SESSION: continue with moderate intensity functional strengthening and balance exercises as able, address back and right glute pain to allow return to functional strengthening and balance with better tolerance.      Everlean Alstrom. Graylon Good, PT, DPT 03/29/22, 7:45  PM  [facility] [address] [place], [place] [address] P: [phone] I F: [phone]

## 2022-04-03 ENCOUNTER — Ambulatory Visit: Payer: 59 | Admitting: Physical Therapy

## 2022-04-05 ENCOUNTER — Ambulatory Visit: Payer: 59 | Admitting: Physical Therapy

## 2022-04-10 ENCOUNTER — Encounter: Payer: Self-pay | Admitting: Physical Therapy

## 2022-04-10 ENCOUNTER — Ambulatory Visit: Payer: 59 | Admitting: Physical Therapy

## 2022-04-10 DIAGNOSIS — R2689 Other abnormalities of gait and mobility: Secondary | ICD-10-CM

## 2022-04-10 DIAGNOSIS — M6281 Muscle weakness (generalized): Secondary | ICD-10-CM

## 2022-04-10 DIAGNOSIS — Z9181 History of falling: Secondary | ICD-10-CM

## 2022-04-10 NOTE — Therapy (Signed)
OUTPATIENT PHYSICAL THERAPY TREATMENT NOTE   Patient Name: [name] MRN: [id number] DOB:04/01/1975, 47 y.o., female Today's Date: 04/10/2022  PCP: Eunice Blase, PA-C REFERRING PROVIDER: Eunice Blase, PA-C  END OF SESSION:   PT End of Session - 04/10/22 2010     Visit Number 36    Number of Visits 49    Date for PT Re-Evaluation 06/19/22    Authorization Type [employer] MEDICARE/Medicaid reporting period from 03/27/2022    Progress Note Due on Visit 40    PT Start Time 1518    PT Stop Time 1558    PT Time Calculation (min) 40 min    Activity Tolerance Patient limited by pain;Patient limited by fatigue    Behavior During Therapy [facility] for tasks assessed/performed              Past Medical History:  Diagnosis Date   Alcohol abuse    Anxiety    Anxiety    Anxiety and depression    Asthma    Bipolar disorder ([name])    Borderline personality disorder ([name])    Depression    Diabetes ([name])    Type 2    Dyslipidemia    Esophageal varices ([name])    Foot drop    right   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[name])    GERD (gastroesophageal reflux disease)    Gout    History of drug abuse ([name])    Insomnia    Muscular dystrophies ([name])    Neuropathy    Pancreatitis    Recovering alcoholic ([name])    Past Surgical History:  Procedure Laterality Date   HERNIA REPAIR     IR FLUORO GUIDE CV LINE RIGHT  12/30/2020   IR REMOVAL TUN CV CATH W/O FL  01/19/2021   IR US GUIDE VASC ACCESS RIGHT  12/30/2020   Patient Active Problem List   Diagnosis Date Noted   Frequent falls 02/12/2021   Leukocytosis    Aspiration pneumonia of both lower lobes due to gastric secretions ([name]) 01/04/2021   Pressure injury of skin 12/24/2020   Acute hepatic encephalopathy ([name]) 12/20/2020   Sepsis ([name]) 12/19/2020   Acute hyponatremia 12/18/2020   Alcohol use disorder, moderate, dependence ([name]) 12/18/2020   Hepatic steatosis 12/18/2020   Polysubstance abuse ([name]) 12/18/2020    Hyperglycemia due to type 2 diabetes mellitus ([name]) 12/18/2020   Hyperbilirubinemia 12/18/2020   Lactic acidosis 12/18/2020   Alcoholic liver disease ([name]) 12/18/2020   Bipolar 1 disorder, manic, moderate ([name]) 09/10/2018   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   Diabetes ([name]) 09/10/2018   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   Chronic gout of foot 09/29/2015   Muscular dystrophy ([name]) 09/29/2015   Type 2 diabetes mellitus without complication ([name]) 09/29/2015   Anxiety and depression 10/01/2012   Protein-calorie malnutrition, severe ([name]) 09/30/2012   Alcohol abuse 09/26/2012   Trichomonas infection 09/26/2012   Dehydration 09/26/2012   Hypokalemia 11/14/2011   Thrombocytopenia ([name]) 11/14/2011   Alcohol withdrawal ([name]) 11/13/2011   Bipolar 2 disorder ([name]) 11/13/2011   Tobacco abuse 11/13/2011   Cocaine abuse ([name]) 11/13/2011   Gout 11/13/2011     REFERRING DIAG: bilateral leg weakness  THERAPY DIAG:  Other abnormalities of gait and mobility  Muscle weakness (generalized)  History of falling  Rationale for Evaluation and Treatment Rehabilitation  PERTINENT HISTORY: Patient is a 47 y.o. female who presents to outpatient physical therapy with a referral for medical diagnosis bilateral leg weakness. This patient's chief complaints consist of  difficulty with weakness and balance with history of frequent falling leading to the following functional deficits: difficulty with basic mobility including ambulation, stairs, transfers; difficulty with ADLs, IADLs. Currently requires assistance to navigate stairs, assistance with ADLs and  IADLs, and ambulates with RW when she previously did not need an assistive device. Relevant past medical history and comorbidities include muscular dystrophy (FSHD - facioscapulohumeral muscular dystrophy), anxiety and depression, bipolar 1 disorder, borderline personality disorder, diabetes, GERD, gout, pancreatitis, alcoholic  liver disease, tobacco abuse,  foot drop, neuropathy, recovering alcoholic, history of drug abuse, history of hernia repair.  Patient denies hx of cancer, stroke, seizures, lung problems, heart problems, unexplained weight loss, unexplained changes in bowel or bladder problems, unexplained stumbling or dropping things, osteoporosis, and spinal surgery  PRECAUTIONS: Fall, not drink alcohol.  SUBJECTIVE: Patient arrives with rollator. She missed her last PT session because she fell when stepping down from the kitchen. She stepped with the right LE and the left suddenly gave out on her. She got up with help from her cousin and holding on to stationary objects. She states she had been feeling good before falling but now she is having intermittent shooting pain down to mid posterior lateral thigh. She states it feels nerve-ish. She did some of her HEP the prior week, but then her cousin fell and then she fell so she missed appointments and did not get to her HEP.   PAIN: NPRS 6/10 in low back and right glute.   OBJECTIVE  TODAY'S TREATMENT:  Therapeutic exercise: to centralize symptoms and improve ROM, strength, muscular endurance, and activity tolerance required for successful completion of functional activities.  - NuStep level 3 using bilateral upper and lower extremities. Seat/handle setting 9/9. For improved extremity mobility, muscular endurance, and activity tolerance; and to induce the analgesic effect of aerobic exercise, stimulate improved joint nutrition, and prepare body structures and systems for following interventions. x 5 minutes. Average SPM = 71. RPE 5/10. (Manual therapy / Dry needling - see below) - sit <> stand from 19 inch plinth, 4x5, no UE support (RPE ranged from 4-6/10)  Manual therapy: to reduce pain and tissue tension, improve range of motion, neuromodulation, in order to promote improved ability to complete functional activities. PRONE - STM to lumbar paraspinals and right  glute and deep posterior hip muscles as tolerated.   Modality: (unbilled) Dry needling performed to lumbar spine to decrease pain and spasms along patient's lumbar and right glute and thigh region with patient in prone utilizing 2 dry needle(s) .33mm x 152mm with 4 sticks total to bilateral multifidus, one on each side at L4 and L5. Patient educated about the risks and benefits from therapy and verbally consents to treatment.  Dry needling performed by Everlean Alstrom. Graylon Good PT, DPT who is certified in this technique.  Pt required multimodal cuing for proper technique and to acilitate improved neuromuscular control, strength, range of motion, and functional ability resulting in improved performance and form.     PATIENT EDUCATION:  Education details: form/technique with exercise. Person educated: Patient Education method: Explanation, demonstration, verbal cuing, tactile cuing. Education comprehension: verbalized understanding, demonstrated understanding, and needs further education     HOME EXERCISE PROGRAM: Access Code: [email or web address] URL: [email or web address] Date: 02/15/2022 Prepared by: Rosita Kea  Exercises - Sit to Stand Without Arm Support  - 3 x weekly - 3 sets - 5 reps - Seated Toe Raise  - 3 x weekly - 3 sets - 15 reps - 1 second hold - Supine  Lower Trunk Rotation  - 3 x weekly - 1 sets - 20 reps - 1-5 seconds hold - Prone Press Up  - 3 x weekly - 2 sets - 5 reps  HOME EXERCISE PROGRAM [[id number]]  Seated marching with weighted twist -  Repeat 10 Times, Hold 1 Second(s), Complete 3 Sets, Perform 1 Times a Day    ASSESSMENT:   CLINICAL IMPRESSION: Patient arrives with increased pain in right buttock and low back after fall last week. Dry needling and manual therapy decreased pain and patient was able to continue with functional strengthening. Plan to continue with strengthening and balance interventions as tolerated and interventions for pain control as needed. Patient  would benefit from continued management of limiting condition by skilled physical therapist to address remaining impairments and functional limitations to work towards stated goals and return to PLOF or maximal functional independence.     From PT Eval 07/10/2021:  Patient is a 47 y.o. female referred to outpatient physical therapy with a medical diagnosis of bilateral leg weakness who presents with signs and symptoms consistent with generalized weakness, balance deficits, and motor control problems likely related to deconditioning and FSHD muscular dystrophy. Patient presents with significant balance, motor control, posture, postural, gait, pain, muscle performance (strength/power/endurance) and activity tolerance impairments that are limiting ability to complete basic mobility and self care including ambulation, stairs, transfers, ADLs, and IADLs without difficulty. At eval requires assistance to navigate stairs, assistance with ADLs and  IADLs, and ambulates with RW when she previously did not need an assistive device. Patient would likely benefit from bilateral AFOs to address foot drop found at both ankles and improve her steppage gait. Patient will benefit from skilled physical therapy intervention to address current body structure impairments and activity limitations to improve function and work towards goals set in current POC in order to return to prior level of function or maximal functional improvement.   OBJECTIVE IMPAIRMENTS Abnormal gait, decreased activity tolerance, decreased balance, decreased coordination, decreased endurance, decreased mobility, difficulty walking, decreased ROM, decreased strength, impaired perceived functional ability, increased muscle spasms, impaired tone, impaired UE functional use, improper body mechanics, postural dysfunction, and pain.    ACTIVITY LIMITATIONS cleaning, community activity, driving, meal prep, laundry, and shopping.    PERSONAL FACTORS Behavior  pattern, Fitness, Past/current experiences, Time since onset of injury/illness/exacerbation, Transportation, and 3+ comorbidities:    muscular dystrophy (FSHD - facioscapulohumeral muscular dystrophy), anxiety and depression, bipolar 1 disorder, borderline personality disorder, diabetes, GERD, gout, pancreatitis, alcoholic liver disease, tobacco abuse,  foot drop, neuropathy, recovering alcoholic, history of drug abuse, history of hernia repair are also affecting patient's functional outcome.      REHAB POTENTIAL: Good   CLINICAL DECISION MAKING: Evolving/moderate complexity   EVALUATION COMPLEXITY: Moderate     GOALS: Goals reviewed with patient? No   SHORT TERM GOALS: Target date: 07/24/2021   Patient will be independent with initial home exercise program for self-management of symptoms. Baseline: Initial HEP to be provided at visit 2 as appropriate (07/10/21); Goal status: Met     LONG TERM GOALS: Target date: 10/02/2021; updated to 12/27/2021 for all unmet goals on 10/04/2021; updated to 03/26/2022 for all unmet goals on 01/01/2022; updated to 06/19/2022 for all unmet goals on 03/27/2022.    Patient will be independent with a long-term home exercise program for self-management of symptoms.  Baseline: Initial HEP to be provided at visit 2 as appropriate (07/10/21); participating as tolerated, recently too much pain to participate (09/04/2021); participating regularly (10/04/2021);  participating as able (11/21/2021); not participating regularly over last couple of weeks (01/01/2022); no participating regularly since last progress note (02/15/2022); improving participation (03/27/2022);  Goal status: Ongoing   2.  Patient will demonstrate improved FOTO score by 10 points from baseline to demonstrate improvement in overall condition and self-reported functional ability.  Baseline: to be measured visit 2 as appropriate (07/10/21); 46 at visit # 2 (07/17/2021); 50 at visit # 10 (09/04/2021); 53 at visit #15  (10/04/2021); 50 at visit # 20 (11/21/2021); 52 at visit # 26 (01/01/2022); 50 at visit #30 (02/15/2022); 54 at visit # 34 (03/27/22);  Goal status: In-Progress   3.  Patient will ambulate equal or greater than 1000 feet during 6 Min Walk Test using [facility] or less restrictive assistive device to improve community ambulation.  Baseline: To be tested visit 2 as appropriate (07/10/21); 310 feet with RW (07/17/2021); 556 feet with rollator (09/04/2021); 600 feet with rollator (10/04/2021); 300 feet with rollator - had to stop test at 3 min due to intolerable right glute pain (11/21/2021); 420 feet with rollator and steppage gait, R > L. Stopped at 5 min due to feeling of instability in low back to mid thoracic spine (01/01/2022); 573 feet with rollator and steppage gait, R > L. Occasional standing breaks. Low back pain increased to 3.5/10 (02/15/2022);  381 feet with rollator and steppage gait, R > L. Stopped at 4:30 min due to pain in low back and right glute (03/27/2022);  Goal status: In-Progress   4.  Patient will complete 5 Time Sit To Stand test in equal or less than 20 seconds with no UE support from 18.5 inch plinth to demonstrate improved LE functional strength and power for basic mobility and improved fall risk.  Baseline: 24 seconds with B UE on 18.5 inch plinth.  (07/10/21); 20 seconds with B UE on thighs from 18.5 inch plinth (09/04/2021); 19 seconds with B UE on thighs from 18.5 inch plinth (10/04/2021); 22 seconds with B UE on thighs from 18.5 inch plinth (11/21/2021); 20 seconds with B UE on thighs from 18.5 inch plinth (01/01/2022); 19 seconds without use of B UE support (02/15/2022); 21 seconds with no UE support from 18.5 inch plinth (03/27/2022);      5.  Patient be able to ascend and descend stairs at her home mod I with use of B handrails.  Baseline: able to ascend/descend 4 steps in the clinic with B UE support on handrails and supervision - reports currently needs assistance to descend steps at home.  (07/10/21); able to ascend/descend 4 steps in the clinic with B UE support on handrails and supervision, leading up and down with left. able to perform with reversed LE leads (09/04/2021); able to ascend/descend 4 steps in the clinic with B UE support on handrails and supervision, step over step with heavy UE use on the way down (10/04/2021); able to ascend/descend 4 steps in the clinic with B UE support on handrails and supervision, step over step up and step to leading with R LE with heavy UE use on the way down (11/21/2021); ascend/descends 4 steps in the clinic with B UE support on handrails and supervision, step over step up and down with heavy UE use on the way down (01/01/2022); able to ascend/descend 4 steps in the clinic with B UE support on handrails and supervision, step over step up and down with heavy UE use on the way down. leads up with right foot step to gait (02/15/2022; 03/27/22);  Goal status: MET   6.  Patient will demonstrate ability to perform static balance in tandem stance for equal or greater than 30 seconds on each side, to demonstrate improved balance and decreased fall risk.  Baseline: right foot front 10 seconds, left foot front 7 seconds (07/10/2021); right foot front 4 seconds, left foot front 6 seconds (09/04/2021); right foot front 9 seconds, left foot front 31 seconds (10/04/2021); right foot front 6.5 seconds, left foot front 8.5 seconds (11/21/2021); right foot front 8 seconds, left foot front 7 seconds (01/01/2022);  right foot front 9 seconds, left foot front 20 seconds (02/15/2022); right foot front 30 seconds, left foot front 45 seconds (03/27/2022);  Goal status: MET 03/27/22     PLAN: PT FREQUENCY: 2x/week   PT DURATION: 12 weeks   PLANNED INTERVENTIONS: Therapeutic exercises, Therapeutic activity, Neuromuscular re-education, Balance training, Gait training, Patient/Family education, Joint mobilization, Orthotic/Fit training, DME instructions, Dry Needling, Electrical  stimulation, Cryotherapy, Moist heat, Splintting, Taping, and Manual therapy   PLAN FOR NEXT SESSION: continue with moderate intensity functional strengthening and balance exercises as able, address back and right glute pain to allow return to functional strengthening and balance with better tolerance.      Everlean Alstrom. Graylon Good, PT, DPT 04/10/22, 8:15 PM  [facility] [address] [place], [place] [address] P: [phone] I F: [phone]

## 2022-04-12 ENCOUNTER — Ambulatory Visit: Payer: 59 | Admitting: Physical Therapy

## 2022-04-16 ENCOUNTER — Other Ambulatory Visit: Payer: Self-pay

## 2022-04-16 ENCOUNTER — Encounter: Payer: Self-pay | Admitting: Gastroenterology

## 2022-04-16 ENCOUNTER — Ambulatory Visit (INDEPENDENT_AMBULATORY_CARE_PROVIDER_SITE_OTHER): Payer: 59 | Admitting: Gastroenterology

## 2022-04-16 VITALS — BP 159/96 | HR 118 | Temp 98.1°F | Ht 67.5 in | Wt 246.0 lb

## 2022-04-16 DIAGNOSIS — K709 Alcoholic liver disease, unspecified: Secondary | ICD-10-CM | POA: Diagnosis not present

## 2022-04-16 DIAGNOSIS — R1319 Other dysphagia: Secondary | ICD-10-CM

## 2022-04-16 NOTE — Patient Instructions (Signed)
Please arrive 30 minutes before your ultrasound at the [facility]. Please do not eat or drink anything after midnight the night before.

## 2022-04-16 NOTE — Progress Notes (Signed)
Jonathon Bellows MD, MRCP(U.K) [address]  [place]  [place], [place] [address]  Main: [phone]  Fax: [phone]   Primary Care Physician: Janine Limbo, PA-C  Primary Gastroenterologist:  Dr. Jonathon Bellows   Chief Complaint  Patient presents with   Cirrhosis    HPI: [name] is a 46 y.o. female   Summary of history :  She was last seen by my self back in October 2022 when she presented with jaundice show history of bipolar disorder alcohol and substance abuse she came in with grossly abnormal liver function tests she had been drinking alcohol most days of the week about 1/5 of a bottle but was unable to tell what kind of alcohol she was consuming she has been incarcerated in the past and had tattoos professionally performed.  Was treated for hepatic encephalopathy, alcoholic hepatitis she had hepatitis B E antigen positive with negative viral load and surface antigen.  Was advised to abstain from all alcohol in the future.  Was not a candidate for steroids due to admission with pneumonia.  She did not follow-up subsequently.  No recent imaging of the liver and  07/31/2021 hemoglobin 13 g, CMP albumin 3.3 total bilirubin 0.5 creatinine 0.73.  Since her hospitalization she stopped drinking all alcohol has been sober for 16 months stopped all illegal drug use as well as stop smoking.  No complaints except for dysphagia ongoing for more than 2 years she states that Dr. Melina Copa diagnosed with collagenous colitis about 3 years back.  She has dysphagia for solids more than liquids feels like pills get stuck in her throat at times.  She is taking Protonix.  Current Outpatient Medications  Medication Sig Dispense Refill   Accu-Chek Softclix Lancets lancets SMARTSIG:1 Topical Daily     albuterol (VENTOLIN HFA) 108 (90 Base) MCG/ACT inhaler Inhale 2 puffs into the lungs every 6 (six) hours as needed for wheezing. 6.7 g 1   amitriptyline (ELAVIL) 25 MG tablet Take 25 mg by mouth at  bedtime.     atorvastatin (LIPITOR) 40 MG tablet Take 1 tablet (40 mg total) by mouth daily. 30 tablet 2   b complex vitamins capsule Take 1 capsule by mouth daily.     budesonide (ENTOCORT EC) 3 MG 24 hr capsule Take 9 mg by mouth daily.     calcium-vitamin D (OSCAL WITH D) 500-200 MG-UNIT tablet Take 1 tablet by mouth daily with breakfast. 30 tablet 1   dicyclomine (BENTYL) 20 MG tablet Take 20 mg by mouth 3 (three) times daily.     FLUoxetine (PROZAC) 40 MG capsule Take 2 capsules (80 mg total) by mouth daily. 30 capsule 1   fluticasone (FLONASE) 50 MCG/ACT nasal spray Place 1 spray into both nostrils daily. 9.9 mL 1   folic acid (FOLVITE) 1 MG tablet Take 1 tablet (1 mg total) by mouth daily. 30 tablet 1   furosemide (LASIX) 40 MG tablet Take 1 tablet (40 mg total) by mouth daily. 30 tablet 1   hydrOXYzine (ATARAX) 10 MG tablet Take 10 mg by mouth 2 (two) times daily as needed.     insulin glargine-yfgn (SEMGLEE) 100 UNIT/ML injection Inject 0.28 mLs (28 Units total) into the skin 2 (two) times daily. 10 mL 3   insulin lispro (HUMALOG) 100 UNIT/ML injection Inject 0.12 mLs (12 Units total) into the skin 3 (three) times daily with meals. 10 mL 3   lactulose (CHRONULAC) 10 GM/15ML solution Take 30 mLs (20 g total)  by mouth 2 (two) times daily as needed for mild constipation. 236 mL 1   LANTUS SOLOSTAR 100 UNIT/ML Solostar Pen Inject 27 mLs into the skin 2 (two) times daily.     lisinopril (ZESTRIL) 10 MG tablet Take 10 mg by mouth daily.     magnesium oxide (MAG-OX) 400 (240 Mg) MG tablet Take 1 tablet (400 mg total) by mouth 2 (two) times daily.     methocarbamol (ROBAXIN) 500 MG tablet Take 500 mg by mouth 3 (three) times daily as needed.     Mirabegron (MYRBETRIQ PO) Take by mouth.     mirtazapine (REMERON) 15 MG tablet Take 15 mg by mouth at bedtime.     Multiple Vitamin (MULTIVITAMIN WITH MINERALS) TABS tablet Take 1 tablet by mouth daily. 30 tablet 1   OLANZapine (ZYPREXA) 5 MG tablet  Take 1 tablet (5 mg total) by mouth at bedtime. 30 tablet 0   OZEMPIC, 0.25 OR 0.5 MG/DOSE, 2 MG/3ML SOPN once a week.     pantoprazole (PROTONIX) 40 MG tablet Take 1 tablet (40 mg total) by mouth daily. 30 tablet 2   phentermine (ADIPEX-P) 37.5 MG tablet Take 37.5 mg by mouth daily.     pregabalin (LYRICA) 150 MG capsule Take 1 capsule (150 mg total) by mouth 3 (three) times daily. 30 capsule 0   promethazine (PHENERGAN) 25 MG tablet Take 25 mg by mouth every 6 (six) hours as needed.     rifaximin (XIFAXAN) 550 MG TABS tablet Take 1 tablet (550 mg total) by mouth 2 (two) times daily. 60 tablet 1   spironolactone (ALDACTONE) 100 MG tablet Take 1 tablet (100 mg total) by mouth daily. 30 tablet 1   thiamine 100 MG tablet Take 1 tablet (100 mg total) by mouth daily.     VRAYLAR 3 MG capsule Take 3 mg by mouth daily.     No current facility-administered medications for this visit.    Allergies as of 04/16/2022 - Review Complete 04/16/2022  Allergen Reaction Noted   Erythromycin Anaphylaxis 11/13/2011   Sulfa antibiotics Anaphylaxis 11/13/2011   Tylenol [acetaminophen] Other (See Comments) 09/26/2012   Nsaids Other (See Comments) 09/26/2012   Gabapentin  03/27/2018    ROS:  General: Negative for anorexia, weight loss, fever, chills, fatigue, weakness. ENT: Negative for hoarseness, difficulty swallowing , nasal congestion. CV: Negative for chest pain, angina, palpitations, dyspnea on exertion, peripheral edema.  Respiratory: Negative for dyspnea at rest, dyspnea on exertion, cough, sputum, wheezing.  GI: See history of present illness. GU:  Negative for dysuria, hematuria, urinary incontinence, urinary frequency, nocturnal urination.  Endo: Negative for unusual weight change.    Physical Examination:   BP (!) 159/96   Pulse (!) 118   Temp 98.1 F (36.7 C) (Oral)   Ht 5' 7.5" (1.715 m)   Wt 246 lb (111.6 kg)   BMI 37.96 kg/m   General: Well-nourished, well-developed in no acute  distress.  Eyes: No icterus. Conjunctivae pink. Neuro: Alert and oriented x 3.  Grossly intact. Skin: Warm and dry, no jaundice.   Psych: Alert and cooperative, normal mood and affect.   Imaging Studies: No results found.  Assessment and Plan:   [name] is a 47 y.o. y/o female with a history of alcoholic liver disease last seen back in 2022 when she was actively consuming alcohol a lot along with polysubstance abuse has since stopped drinking alcohol smoking as well as using illegal drugs.  Congratulated her on all the good  behavior for over 16 months  Plan 1.  Continue to stay off all alcohol and smoking 2.  Full autoimmune and viral hepatitis workup 3.  Previously had a positive hepatitis B E antigen with other markers negative we will recheck 4.  Right upper quadrant ultrasound to evaluate liver 5.  EGD to evaluate for dysphagia, history of collagenous colitis but asymptomatic presently it is associated with eosinophilic esophagitis. 6.  Discussed about GERD lifestyle changes weight loss.  Will discuss further at next visit   I have discussed alternative options, risks & benefits,  which include, but are not limited to, bleeding, infection, perforation,respiratory complication & drug reaction.  The patient agrees with this plan & written consent will be obtained.      Follow-up in 8 weeks  Dr [name] MD,MRCP [facility]) Gastroenterology/Hepatology Pager: [phone]

## 2022-04-17 ENCOUNTER — Encounter: Payer: Self-pay | Admitting: Physical Therapy

## 2022-04-17 ENCOUNTER — Ambulatory Visit: Payer: 59 | Admitting: Physical Therapy

## 2022-04-17 DIAGNOSIS — M6281 Muscle weakness (generalized): Secondary | ICD-10-CM

## 2022-04-17 DIAGNOSIS — R2689 Other abnormalities of gait and mobility: Secondary | ICD-10-CM | POA: Diagnosis not present

## 2022-04-17 DIAGNOSIS — Z9181 History of falling: Secondary | ICD-10-CM

## 2022-04-17 NOTE — Therapy (Signed)
OUTPATIENT PHYSICAL THERAPY TREATMENT NOTE   Patient Name: [name] MRN: [id number] DOB:[birth year], 47 y.o., female Today's Date: 04/17/2022  PCP: Janine Limbo, PA-C REFERRING PROVIDER: Janine Limbo, PA-C  END OF SESSION:   PT End of Session - 04/17/22 1511     Visit Number 37    Number of Visits 41    Date for PT Re-Evaluation 06/19/22    Authorization Type [employer] MEDICARE/Medicaid reporting period from 03/27/2022    Progress Note Due on Visit 40    PT Start Time 1435    PT Stop Time 1513    PT Time Calculation (min) 38 min    Activity Tolerance Patient limited by pain;Patient limited by fatigue    Behavior During Therapy [facility] for tasks assessed/performed               Past Medical History:  Diagnosis Date   Alcohol abuse    Anxiety    Anxiety    Anxiety and depression    Asthma    Bipolar disorder ([place])    Borderline personality disorder ([place])    Depression    Diabetes ([place])    Type 2    Dyslipidemia    Esophageal varices ([name])    Foot drop    right   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[place])    GERD (gastroesophageal reflux disease)    Gout    History of drug abuse ([place])    Insomnia    Muscular dystrophies ([place])    Neuropathy    Pancreatitis    Recovering alcoholic ([place])    Past Surgical History:  Procedure Laterality Date   HERNIA REPAIR     IR FLUORO GUIDE CV LINE RIGHT  12/30/2020   IR REMOVAL TUN CV CATH W/O FL  01/19/2021   IR US GUIDE VASC ACCESS RIGHT  12/30/2020   Patient Active Problem List   Diagnosis Date Noted   Frequent falls 02/12/2021   Leukocytosis    Aspiration pneumonia of both lower lobes due to gastric secretions ([place]) 01/04/2021   Pressure injury of skin 12/24/2020   Acute hepatic encephalopathy ([place]) 12/20/2020   Sepsis ([place]) 12/19/2020   Acute hyponatremia 12/18/2020   Alcohol use disorder, moderate, dependence ([place]) 12/18/2020   Hepatic steatosis 12/18/2020   Polysubstance abuse ([place]) 12/18/2020    Hyperglycemia due to type 2 diabetes mellitus ([place]) 12/18/2020   Hyperbilirubinemia 12/18/2020   Lactic acidosis 32/99/2426   Alcoholic liver disease ([place]) 12/18/2020   Bipolar 1 disorder, manic, moderate ([place]) 09/10/2018   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   Diabetes ([place]) 09/10/2018   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[place]) 03/28/2018   Chronic gout of foot 09/29/2015   Muscular dystrophy ([place]) 09/29/2015   Type 2 diabetes mellitus without complication ([place]) 83/41/9622   Anxiety and depression 10/01/2012   Protein-calorie malnutrition, severe ([place]) 09/30/2012   Alcohol abuse 09/26/2012   Trichomonas infection 09/26/2012   Dehydration 09/26/2012   Hypokalemia 11/14/2011   Thrombocytopenia ([place]) 11/14/2011   Alcohol withdrawal ([place]) 11/13/2011   Bipolar 2 disorder ([place]) 11/13/2011   Tobacco abuse 11/13/2011   Cocaine abuse ([place]) 11/13/2011   Gout 11/13/2011     REFERRING DIAG: bilateral leg weakness  THERAPY DIAG:  Other abnormalities of gait and mobility  Muscle weakness (generalized)  History of falling  Rationale for Evaluation and Treatment Rehabilitation  PERTINENT HISTORY: Patient is a 47 y.o. female who presents to outpatient physical therapy with a referral for medical diagnosis bilateral leg weakness. This patient's chief complaints consist  of difficulty with weakness and balance with history of frequent falling leading to the following functional deficits: difficulty with basic mobility including ambulation, stairs, transfers; difficulty with ADLs, IADLs. Currently requires assistance to navigate stairs, assistance with ADLs and  IADLs, and ambulates with RW when she previously did not need an assistive device. Relevant past medical history and comorbidities include muscular dystrophy (FSHD - facioscapulohumeral muscular dystrophy), anxiety and depression, bipolar 1 disorder, borderline personality disorder, diabetes, GERD, gout, pancreatitis, alcoholic  liver disease, tobacco abuse,  foot drop, neuropathy, recovering alcoholic, history of drug abuse, history of hernia repair.  Patient denies hx of cancer, stroke, seizures, lung problems, heart problems, unexplained weight loss, unexplained changes in bowel or bladder problems, unexplained stumbling or dropping things, osteoporosis, and spinal surgery  PRECAUTIONS: Fall, not drink alcohol.  SUBJECTIVE: Patient arrives with rollator. She states she fell again last Wednesday when she was trying to step up to her kitchen with her left leg and it went down instead of up. Her cousin helped her get up. She states her pain is elevated to 6.5/10 currently but it is is fluctuating. She just helped put more oil in her car and that exacerbated it. She thinks she can do the nustep. She gets to see her mother tomorrow. She saw her new gastroenterologist and he has scheduled an endoscopy because she is having a hard time swallowing.   PAIN: NPRS 6/10 in low back and right glute.   OBJECTIVE  TODAY'S TREATMENT:  Therapeutic exercise: to centralize symptoms and improve ROM, strength, muscular endurance, and activity tolerance required for successful completion of functional activities.  - NuStep level 3 using bilateral upper and lower extremities. Seat/handle setting 9/9. For improved extremity mobility, muscular endurance, and activity tolerance; and to induce the analgesic effect of aerobic exercise, stimulate improved joint nutrition, and prepare body structures and systems for following interventions. x 3 minutes. (Discontinued due to shooting pain in right glute).  Average SPM = 71. RPE 5/10.  (Manual therapy / Dry needling - see below)  - sit <> stand from 19 inch plinth, 3x5/5/4, no UE support (RPE ranged from 4-6/10)  Manual therapy: to reduce pain and tissue tension, improve range of motion, neuromodulation, in order to promote improved ability to complete functional activities. PRONE - STM to lumbar  paraspinals and right glute and deep posterior hip muscles as tolerated.   Modality: (unbilled) Dry needling performed to lumbar spine to decrease pain and spasms along patient's lumbar and right glute and thigh region with patient in prone utilizing 2 dry needle(s) .82mm x 179mm with 4 sticks total to bilateral multifidus, one on each side at L4 and L5. Patient educated about the risks and benefits from therapy and verbally consents to treatment.  Dry needling performed by Everlean Alstrom. Graylon Good PT, DPT who is certified in this technique.  Pt required multimodal cuing for proper technique and to acilitate improved neuromuscular control, strength, range of motion, and functional ability resulting in improved performance and form.     PATIENT EDUCATION:  Education details: form/technique with exercise. Person educated: Patient Education method: Explanation, demonstration, verbal cuing, tactile cuing. Education comprehension: verbalized understanding, demonstrated understanding, and needs further education     HOME EXERCISE PROGRAM: Access Code: [email or web address] URL: [email or web address] Date: 02/15/2022 Prepared by: Rosita Kea  Exercises - Sit to Stand Without Arm Support  - 3 x weekly - 3 sets - 5 reps - Seated Toe Raise  - 3 x weekly - 3 sets - 15 reps -  1 second hold - Supine Lower Trunk Rotation  - 3 x weekly - 1 sets - 20 reps - 1-5 seconds hold - Prone Press Up  - 3 x weekly - 2 sets - 5 reps  HOME EXERCISE PROGRAM [[id number]]  Seated marching with weighted twist -  Repeat 10 Times, Hold 1 Second(s), Complete 3 Sets, Perform 1 Times a Day    ASSESSMENT:   CLINICAL IMPRESSION: Patient arrives with pain in right buttock and low back furhter increased after another fall since last PT session when her left LE gave out on her while stepping up to her kitchen. Repeated dry needling and manual therapy to help bring down the pain from 6.5/10 to 5.75/10 by end of session. Patient was  unable to tolerate full 5 min of planned nustep exercise prior to manual/dry needling, but was able to complete 3 sets of sit <> stand following those interventions. Patient educated on options to help keep her from falling should her leg give out in the future. Plan to continue with strengthening and balance interventions as tolerated and interventions for pain control as needed. Patient would benefit from continued management of limiting condition by skilled physical therapist to address remaining impairments and functional limitations to work towards stated goals and return to PLOF or maximal functional independence.     From PT Eval 07/10/2021:  Patient is a 47 y.o. female referred to outpatient physical therapy with a medical diagnosis of bilateral leg weakness who presents with signs and symptoms consistent with generalized weakness, balance deficits, and motor control problems likely related to deconditioning and FSHD muscular dystrophy. Patient presents with significant balance, motor control, posture, postural, gait, pain, muscle performance (strength/power/endurance) and activity tolerance impairments that are limiting ability to complete basic mobility and self care including ambulation, stairs, transfers, ADLs, and IADLs without difficulty. At eval requires assistance to navigate stairs, assistance with ADLs and  IADLs, and ambulates with RW when she previously did not need an assistive device. Patient would likely benefit from bilateral AFOs to address foot drop found at both ankles and improve her steppage gait. Patient will benefit from skilled physical therapy intervention to address current body structure impairments and activity limitations to improve function and work towards goals set in current POC in order to return to prior level of function or maximal functional improvement.   OBJECTIVE IMPAIRMENTS Abnormal gait, decreased activity tolerance, decreased balance, decreased coordination,  decreased endurance, decreased mobility, difficulty walking, decreased ROM, decreased strength, impaired perceived functional ability, increased muscle spasms, impaired tone, impaired UE functional use, improper body mechanics, postural dysfunction, and pain.    ACTIVITY LIMITATIONS cleaning, community activity, driving, meal prep, laundry, and shopping.    PERSONAL FACTORS Behavior pattern, Fitness, Past/current experiences, Time since onset of injury/illness/exacerbation, Transportation, and 3+ comorbidities:    muscular dystrophy (FSHD - facioscapulohumeral muscular dystrophy), anxiety and depression, bipolar 1 disorder, borderline personality disorder, diabetes, GERD, gout, pancreatitis, alcoholic liver disease, tobacco abuse,  foot drop, neuropathy, recovering alcoholic, history of drug abuse, history of hernia repair are also affecting patient's functional outcome.      REHAB POTENTIAL: Good   CLINICAL DECISION MAKING: Evolving/moderate complexity   EVALUATION COMPLEXITY: Moderate     GOALS: Goals reviewed with patient? No   SHORT TERM GOALS: Target date: 07/24/2021   Patient will be independent with initial home exercise program for self-management of symptoms. Baseline: Initial HEP to be provided at visit 2 as appropriate (07/10/21); Goal status: Met     LONG  TERM GOALS: Target date: 10/02/2021; updated to 12/27/2021 for all unmet goals on 10/04/2021; updated to 03/26/2022 for all unmet goals on 01/01/2022; updated to 06/19/2022 for all unmet goals on 03/27/2022.    Patient will be independent with a long-term home exercise program for self-management of symptoms.  Baseline: Initial HEP to be provided at visit 2 as appropriate (07/10/21); participating as tolerated, recently too much pain to participate (09/04/2021); participating regularly (10/04/2021); participating as able (11/21/2021); not participating regularly over last couple of weeks (01/01/2022); no participating regularly since last  progress note (02/15/2022); improving participation (03/27/2022);  Goal status: Ongoing   2.  Patient will demonstrate improved FOTO score by 10 points from baseline to demonstrate improvement in overall condition and self-reported functional ability.  Baseline: to be measured visit 2 as appropriate (07/10/21); 46 at visit # 2 (07/17/2021); 50 at visit # 10 (09/04/2021); 53 at visit #15 (10/04/2021); 50 at visit # 20 (11/21/2021); 52 at visit # 26 (01/01/2022); 50 at visit #30 (02/15/2022); 54 at visit # 34 (03/27/22);  Goal status: In-Progress   3.  Patient will ambulate equal or greater than 1000 feet during 6 Min Walk Test using [facility] or less restrictive assistive device to improve community ambulation.  Baseline: To be tested visit 2 as appropriate (07/10/21); 310 feet with RW (07/17/2021); 556 feet with rollator (09/04/2021); 600 feet with rollator (10/04/2021); 300 feet with rollator - had to stop test at 3 min due to intolerable right glute pain (11/21/2021); 420 feet with rollator and steppage gait, R > L. Stopped at 5 min due to feeling of instability in low back to mid thoracic spine (01/01/2022); 573 feet with rollator and steppage gait, R > L. Occasional standing breaks. Low back pain increased to 3.5/10 (02/15/2022);  381 feet with rollator and steppage gait, R > L. Stopped at 4:30 min due to pain in low back and right glute (03/27/2022);  Goal status: In-Progress   4.  Patient will complete 5 Time Sit To Stand test in equal or less than 20 seconds with no UE support from 18.5 inch plinth to demonstrate improved LE functional strength and power for basic mobility and improved fall risk.  Baseline: 24 seconds with B UE on 18.5 inch plinth.  (07/10/21); 20 seconds with B UE on thighs from 18.5 inch plinth (09/04/2021); 19 seconds with B UE on thighs from 18.5 inch plinth (10/04/2021); 22 seconds with B UE on thighs from 18.5 inch plinth (11/21/2021); 20 seconds with B UE on thighs from 18.5 inch plinth (01/01/2022);  19 seconds without use of B UE support (02/15/2022); 21 seconds with no UE support from 18.5 inch plinth (03/27/2022);      5.  Patient be able to ascend and descend stairs at her home mod I with use of B handrails.  Baseline: able to ascend/descend 4 steps in the clinic with B UE support on handrails and supervision - reports currently needs assistance to descend steps at home. (07/10/21); able to ascend/descend 4 steps in the clinic with B UE support on handrails and supervision, leading up and down with left. able to perform with reversed LE leads (09/04/2021); able to ascend/descend 4 steps in the clinic with B UE support on handrails and supervision, step over step with heavy UE use on the way down (10/04/2021); able to ascend/descend 4 steps in the clinic with B UE support on handrails and supervision, step over step up and step to leading with R LE with heavy UE use  on the way down (11/21/2021); ascend/descends 4 steps in the clinic with B UE support on handrails and supervision, step over step up and down with heavy UE use on the way down (01/01/2022); able to ascend/descend 4 steps in the clinic with B UE support on handrails and supervision, step over step up and down with heavy UE use on the way down. leads up with right foot step to gait (02/15/2022; 03/27/22);  Goal status: MET   6.  Patient will demonstrate ability to perform static balance in tandem stance for equal or greater than 30 seconds on each side, to demonstrate improved balance and decreased fall risk.  Baseline: right foot front 10 seconds, left foot front 7 seconds (07/10/2021); right foot front 4 seconds, left foot front 6 seconds (09/04/2021); right foot front 9 seconds, left foot front 31 seconds (10/04/2021); right foot front 6.5 seconds, left foot front 8.5 seconds (11/21/2021); right foot front 8 seconds, left foot front 7 seconds (01/01/2022);  right foot front 9 seconds, left foot front 20 seconds (02/15/2022); right foot front 30  seconds, left foot front 45 seconds (03/27/2022);  Goal status: MET 03/27/22     PLAN: PT FREQUENCY: 2x/week   PT DURATION: 12 weeks   PLANNED INTERVENTIONS: Therapeutic exercises, Therapeutic activity, Neuromuscular re-education, Balance training, Gait training, Patient/Family education, Joint mobilization, Orthotic/Fit training, DME instructions, Dry Needling, Electrical stimulation, Cryotherapy, Moist heat, Splintting, Taping, and Manual therapy   PLAN FOR NEXT SESSION: continue with moderate intensity functional strengthening and balance exercises as able, address back and right glute pain to allow return to functional strengthening and balance with better tolerance.      Everlean Alstrom. Graylon Good, PT, DPT 04/17/22, 7:13 PM  [facility] [address] [place], [place] [address] P: [phone] I F: [phone]

## 2022-04-19 ENCOUNTER — Ambulatory Visit: Payer: 59 | Admitting: Physical Therapy

## 2022-04-21 LAB — COMPREHENSIVE METABOLIC PANEL
ALT: 13 IU/L (ref 0–32)
AST: 26 IU/L (ref 0–40)
Albumin/Globulin Ratio: 1.3 (ref 1.2–2.2)
Albumin: 4 g/dL (ref 3.9–4.9)
Alkaline Phosphatase: 154 IU/L — ABNORMAL HIGH (ref 44–121)
BUN/Creatinine Ratio: 25 — ABNORMAL HIGH (ref 9–23)
BUN: 19 mg/dL (ref 6–24)
Bilirubin Total: <0.2 mg/dL (ref 0.0–1.2)
CO2: 20 mmol/L (ref 20–29)
Calcium: 9.4 mg/dL (ref 8.7–10.2)
Chloride: 103 mmol/L (ref 96–106)
Creatinine, Ser: 0.77 mg/dL (ref 0.57–1.00)
Globulin, Total: 3.2 g/dL (ref 1.5–4.5)
Glucose: 264 mg/dL — ABNORMAL HIGH (ref 70–99)
Potassium: 5.7 mmol/L — ABNORMAL HIGH (ref 3.5–5.2)
Sodium: 136 mmol/L (ref 134–144)
Total Protein: 7.2 g/dL (ref 6.0–8.5)
eGFR: 97 mL/min/{1.73_m2} (ref >59)

## 2022-04-21 LAB — CBC WITH DIFFERENTIAL/PLATELET
Basophils Absolute: 0.1 10*3/uL (ref 0.0–0.2)
Basos: 1 %
EOS (ABSOLUTE): 0.1 10*3/uL (ref 0.0–0.4)
Eos: 1 %
Hematocrit: 36.7 % (ref 34.0–46.6)
Hemoglobin: 11.8 g/dL (ref 11.1–15.9)
Immature Grans (Abs): 0 10*3/uL (ref 0.0–0.1)
Immature Granulocytes: 0 %
Lymphocytes Absolute: 3.2 10*3/uL — ABNORMAL HIGH (ref 0.7–3.1)
Lymphs: 34 %
MCH: 28.1 pg (ref 26.6–33.0)
MCHC: 32.2 g/dL (ref 31.5–35.7)
MCV: 87 fL (ref 79–97)
Monocytes Absolute: 0.4 10*3/uL (ref 0.1–0.9)
Monocytes: 5 %
Neutrophils Absolute: 5.5 10*3/uL (ref 1.4–7.0)
Neutrophils: 59 %
Platelets: 133 10*3/uL — ABNORMAL LOW (ref 150–450)
RBC: 4.2 x10E6/uL (ref 3.77–5.28)
RDW: 13.4 % (ref 11.7–15.4)
WBC: 9.4 10*3/uL (ref 3.4–10.8)

## 2022-04-21 LAB — HEPATITIS B SURFACE ANTIGEN: Hepatitis B Surface Ag: NEGATIVE

## 2022-04-21 LAB — MITOCHONDRIAL/SMOOTH MUSCLE AB PNL
Mitochondrial Ab: <20 Units (ref 0.0–20.0)
Smooth Muscle Ab: 6 Units (ref 0–19)

## 2022-04-21 LAB — IMMUNOGLOBULINS A/E/G/M, SERUM
IgA/Immunoglobulin A, Serum: 371 mg/dL — ABNORMAL HIGH (ref 87–352)
IgE (Immunoglobulin E), Serum: <2 IU/mL — ABNORMAL LOW (ref 6–495)
IgG (Immunoglobin G), Serum: 1418 mg/dL (ref 586–1602)
IgM (Immunoglobulin M), Srm: 173 mg/dL (ref 26–217)

## 2022-04-21 LAB — PROTIME-INR
INR: 0.9 (ref 0.9–1.2)
Prothrombin Time: 9.8 s (ref 9.1–12.0)

## 2022-04-21 LAB — HEPATITIS A ANTIBODY, TOTAL: hep A Total Ab: POSITIVE — AB

## 2022-04-21 LAB — HEPATITIS B CORE ANTIBODY, TOTAL: Hep B Core Total Ab: NEGATIVE

## 2022-04-21 LAB — CERULOPLASMIN: Ceruloplasmin: 36.7 mg/dL (ref 19.0–39.0)

## 2022-04-21 LAB — IRON,TIBC AND FERRITIN PANEL
Ferritin: 22 ng/mL (ref 15–150)
Iron Saturation: 16 % (ref 15–55)
Iron: 61 ug/dL (ref 27–159)
Total Iron Binding Capacity: 372 ug/dL (ref 250–450)
UIBC: 311 ug/dL (ref 131–425)

## 2022-04-21 LAB — CELIAC DISEASE AB SCREEN W/RFX
Antigliadin Abs, IgA: 7 units (ref 0–19)
Transglutaminase IgA: <2 U/mL (ref 0–3)

## 2022-04-21 LAB — HEPATITIS B DNA, ULTRAQUANTITATIVE, PCR: HBV DNA SERPL PCR-ACNC: NOT DETECTED IU/mL

## 2022-04-21 LAB — HEPATITIS B E ANTIGEN: Hep B E Ag: NEGATIVE

## 2022-04-21 LAB — HIV ANTIBODY (ROUTINE TESTING W REFLEX): HIV Screen 4th Generation wRfx: NONREACTIVE

## 2022-04-21 LAB — ANTI-MICROSOMAL ANTIBODY LIVER / KIDNEY: LKM1 Ab: 1.1 Units (ref 0.0–20.0)

## 2022-04-21 LAB — CK: Total CK: 132 U/L (ref 32–182)

## 2022-04-21 LAB — ALPHA-1-ANTITRYPSIN: A-1 Antitrypsin: 147 mg/dL (ref 101–187)

## 2022-04-21 LAB — HEPATITIS B E ANTIBODY: Hep B E Ab: NEGATIVE

## 2022-04-21 LAB — ANA: Anti Nuclear Antibody (ANA): NEGATIVE

## 2022-04-21 LAB — HEPATITIS B SURFACE ANTIBODY,QUALITATIVE: Hep B Surface Ab, Qual: NONREACTIVE

## 2022-04-21 LAB — HEPATITIS C ANTIBODY: Hep C Virus Ab: NONREACTIVE

## 2022-04-23 ENCOUNTER — Ambulatory Visit: Payer: 59 | Attending: Gastroenterology

## 2022-04-24 ENCOUNTER — Ambulatory Visit: Payer: 59 | Admitting: Physical Therapy

## 2022-04-25 ENCOUNTER — Encounter: Payer: Self-pay | Admitting: Registered Nurse

## 2022-04-25 ENCOUNTER — Encounter: Payer: Self-pay | Admitting: Gastroenterology

## 2022-04-25 ENCOUNTER — Encounter
Admission: RE | Disposition: A | Discharge status: Home / Self Care | Payer: Self-pay | Source: Home / Self Care | Attending: Gastroenterology

## 2022-04-25 ENCOUNTER — Ambulatory Visit
Admission: RE | Admit: 2022-04-25 | Discharge: 2022-04-25 | Disposition: A | Discharge status: Home / Self Care | Payer: 59 | Attending: Gastroenterology | Admitting: Gastroenterology

## 2022-04-25 DIAGNOSIS — R1319 Other dysphagia: Secondary | ICD-10-CM

## 2022-04-25 DIAGNOSIS — Z538 Procedure and treatment not carried out for other reasons: Secondary | ICD-10-CM | POA: Insufficient documentation

## 2022-04-25 DIAGNOSIS — R1310 Dysphagia, unspecified: Secondary | ICD-10-CM | POA: Insufficient documentation

## 2022-04-25 HISTORY — PX: ESOPHAGOGASTRODUODENOSCOPY: SHX5428

## 2022-04-25 SURGERY — EGD (ESOPHAGOGASTRODUODENOSCOPY)
Anesthesia: General

## 2022-04-25 MED ORDER — GLYCOPYRROLATE 0.2 MG/ML IJ SOLN
INTRAMUSCULAR | Status: AC
  Filled 2022-04-25: qty 1

## 2022-04-25 MED ORDER — SODIUM CHLORIDE 0.9 % IV SOLN: INTRAVENOUS | Status: DC

## 2022-04-25 MED ORDER — LIDOCAINE HCL (PF) 2 % IJ SOLN
INTRAMUSCULAR | Status: AC
  Filled 2022-04-25: qty 5

## 2022-04-25 MED ORDER — DEXMEDETOMIDINE HCL IN NACL 80 MCG/20ML IV SOLN
INTRAVENOUS | Status: AC
  Filled 2022-04-25: qty 20

## 2022-04-25 MED ORDER — PROPOFOL 1000 MG/100ML IV EMUL
INTRAVENOUS | Status: AC
  Filled 2022-04-25: qty 100

## 2022-04-25 NOTE — Progress Notes (Signed)
Patient was notified that we would need to collect urine for a drug screen. She then admitted that "it's going to be positive," and then apologized for "not being honest about it at first."

## 2022-04-25 NOTE — H&P (Signed)
Kellie Bellows, MD [address] [place], [place], [name], [id number], [name], [name], [name], [name] Phone: [phone]  Fax: [phone]  Primary Care Physician:  Janine Limbo, PA-C   Pre-Procedure History & Physical: HPI:  [name] is a 47 y.o. 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)    Neuropathy    Pancreatitis    Recovering alcoholic ([place])     Past Surgical History:  Procedure Laterality Date   HERNIA REPAIR     IR FLUORO GUIDE CV LINE RIGHT  12/30/2020   IR REMOVAL TUN CV CATH W/O FL  01/19/2021   IR US GUIDE VASC ACCESS RIGHT  12/30/2020    Prior to Admission medications   Medication Sig Start Date End Date Taking? Authorizing Provider  atorvastatin (LIPITOR) 40 MG tablet Take 1 tablet (40 mg total) by mouth daily. 02/14/21  Yes Fritzi Mandes, MD  b complex vitamins capsule Take 1 capsule by mouth daily.   Yes [provider]  calcium-vitamin D (OSCAL WITH D) 500-200 MG-UNIT tablet Take 1 tablet by mouth daily with breakfast. 09/16/18  Yes Clapacs, Madie Reno, MD  FLUoxetine (PROZAC) 40 MG capsule Take 2 capsules (80 mg total) by mouth daily. 02/14/21  Yes Fritzi Mandes, MD  fluticasone (FLONASE) 50 MCG/ACT nasal spray Place 1 spray into both nostrils daily. 09/16/18  Yes Clapacs, Madie Reno, MD  folic acid (FOLVITE) 1 MG tablet Take 1 tablet (1 mg total) by mouth daily. 02/14/21  Yes Fritzi Mandes, MD  LANTUS SOLOSTAR 100 UNIT/ML Solostar Pen Inject 27 mLs into the skin 2 (two)  times daily. 02/23/22  Yes [provider]  lisinopril (ZESTRIL) 10 MG tablet Take 10 mg by mouth daily. 01/15/22  Yes [provider]  magnesium oxide (MAG-OX) 400 (240 Mg) MG tablet Take 1 tablet (400 mg total) by mouth 2 (two) times daily. 01/26/21  Yes Wouk, Ailene Rud, MD  Multiple Vitamin (MULTIVITAMIN WITH MINERALS) TABS tablet Take 1 tablet by mouth daily. 02/14/21  Yes Fritzi Mandes, MD  Accu-Chek Softclix Lancets lancets SMARTSIG:1 Topical Daily 10/24/21   [provider]  albuterol (VENTOLIN HFA) 108 (90 Base) MCG/ACT inhaler Inhale 2 puffs into the lungs every 6 (six) hours as needed for wheezing. 09/15/18   Clapacs, Madie Reno, MD  amitriptyline (ELAVIL) 25 MG tablet Take 25 mg by mouth at bedtime. 04/06/22   [provider]  budesonide (ENTOCORT EC) 3 MG 24 hr capsule Take 9 mg by mouth daily. 12/09/20   [provider]  dicyclomine (BENTYL) 20 MG tablet Take 20 mg by mouth 3 (three) times daily. 07/02/20   [provider]  furosemide (LASIX) 40 MG tablet Take 1 tablet (40 mg total) by mouth daily. 02/14/21   Fritzi Mandes, MD  hydrOXYzine (ATARAX) 10 MG tablet Take 10  mg by mouth 2 (two) times daily as needed. 01/19/22   [provider]  insulin glargine-yfgn (SEMGLEE) 100 UNIT/ML injection Inject 0.28 mLs (28 Units total) into the skin 2 (two) times daily. 02/14/21   Fritzi Mandes, MD  insulin lispro (HUMALOG) 100 UNIT/ML injection Inject 0.12 mLs (12 Units total) into the skin 3 (three) times daily with meals. 02/14/21   Fritzi Mandes, MD  lactulose (CHRONULAC) 10 GM/15ML solution Take 30 mLs (20 g total) by mouth 2 (two) times daily as needed for mild constipation. 02/14/21   Fritzi Mandes, MD  methocarbamol (ROBAXIN) 500 MG tablet Take 500 mg by mouth 3 (three) times daily as needed. 07/18/21   [provider]  Mirabegron (MYRBETRIQ PO) Take by mouth.    [provider]  mirtazapine (REMERON) 15 MG tablet Take 15 mg by  mouth at bedtime. 01/19/22   [provider]  OLANZapine (ZYPREXA) 5 MG tablet Take 1 tablet (5 mg total) by mouth at bedtime. 02/14/21   Fritzi Mandes, MD  OZEMPIC, 0.25 OR 0.5 MG/DOSE, 2 MG/3ML SOPN once a week. 12/04/21   [provider]  pantoprazole (PROTONIX) 40 MG tablet Take 1 tablet (40 mg total) by mouth daily. 02/14/21   Fritzi Mandes, MD  phentermine (ADIPEX-P) 37.5 MG tablet Take 37.5 mg by mouth daily. 11/13/21   [provider]  pregabalin (LYRICA) 150 MG capsule Take 1 capsule (150 mg total) by mouth 3 (three) times daily. 02/14/21   Fritzi Mandes, MD  promethazine (PHENERGAN) 25 MG tablet Take 25 mg by mouth every 6 (six) hours as needed. 01/15/22   [provider]  rifaximin (XIFAXAN) 550 MG TABS tablet Take 1 tablet (550 mg total) by mouth 2 (two) times daily. 02/14/21   Fritzi Mandes, MD  spironolactone (ALDACTONE) 100 MG tablet Take 1 tablet (100 mg total) by mouth daily. 02/14/21   Fritzi Mandes, MD  thiamine 100 MG tablet Take 1 tablet (100 mg total) by mouth daily. 01/27/21   Wouk, Ailene Rud, MD  VRAYLAR 3 MG capsule Take 3 mg by mouth daily. 03/28/22   [provider]    Allergies as of 04/17/2022 - Review Complete 04/17/2022  Allergen Reaction Noted   Erythromycin Anaphylaxis 11/13/2011   Sulfa antibiotics Anaphylaxis 11/13/2011   Tylenol [acetaminophen] Other (See Comments) 09/26/2012   Nsaids Other (See Comments) 09/26/2012   Gabapentin  03/27/2018    Family History  Problem Relation Age of Onset   Heart disease Mother    Heart attack Mother    Hyperlipidemia Maternal Grandmother    Diabetes Maternal Grandmother    Hypertension Maternal Grandmother    Hyperlipidemia Maternal Grandfather    Heart disease Maternal Grandfather    Heart attack Maternal Grandfather    Diabetes Maternal Grandfather    Lung cancer Maternal Grandfather    Hypertension Maternal Grandfather    Other Paternal Grandmother        Thyroid problems     Heart disease Paternal Grandfather    Stroke Paternal Grandfather    Hyperlipidemia Paternal Grandfather    Heart attack Paternal Grandfather    Hypertension Paternal Grandfather     Social History   Socioeconomic History   Marital status: Legally Separated    Spouse name: Not on file   Number of children: Not on file   Years of education: Not on file   Highest education level: Not on file  Occupational History   Occupation: disabled  Tobacco Use   Smoking status: Former  Packs/day: 0.50    Types: Cigarettes    Quit date: 09/2020    Years since quitting: 1.6   Smokeless tobacco: Never  Substance and Sexual Activity   Alcohol use: Not Currently   Drug use: Not Currently    Types: IV, Cocaine    Comment: denies using in the last 6 months   Sexual activity: Yes    Birth control/protection: None  Other Topics Concern   Not on file  Social History Narrative   Not on file   Social Determinants of Health   Financial Resource Strain: Not on file  Food Insecurity: Not on file  Transportation Needs: Not on file  Physical Activity: Not on file  Stress: Not on file  Social Connections: Not on file  Intimate Partner Violence: Not on file    Review of Systems: See HPI, otherwise negative ROS  Physical Exam: BP 131/86   Pulse (!) 101   Temp 97.6 F (36.4 C) (Temporal)   Resp 17   Wt 112.5 kg   SpO2 99%   BMI 38.27 kg/m  General:   Alert,  pleasant and cooperative in NAD Head:  Normocephalic and atraumatic. Neck:  Supple; no masses or thyromegaly. Lungs:  Clear throughout to auscultation, normal respiratory effort.    Heart:  +S1, +S2, Regular rate and rhythm, No edema. Abdomen:  Soft, nontender and nondistended. Normal bowel sounds, without guarding, and without rebound.   Neurologic:  Alert and  oriented x4;  grossly normal neurologically.  Impression/Plan: [name] is here for an endoscopy  to be performed for  evaluation of dysphagia    Risks,  benefits, limitations, and alternatives regarding endoscopy have been reviewed with the patient.  Questions have been answered.  All parties agreeable.   Kellie Bellows, MD  04/25/2022, 8:24 AM

## 2022-04-25 NOTE — Anesthesia Preprocedure Evaluation (Deleted)
Anesthesia Evaluation  Patient identified by MRN, date of birth, ID band Patient awake    Reviewed: Allergy & Precautions, NPO status , Patient's Chart, lab work & pertinent test results  Airway Mallampati: III  TM Distance: >3 FB Neck ROM: full    Dental  (+) Teeth Intact, Caps,    Pulmonary asthma , Patient abstained from smoking., former smoker   Pulmonary exam normal  + decreased breath sounds      Cardiovascular Exercise Tolerance: Good negative cardio ROS Normal cardiovascular exam Rhythm:Regular     Neuro/Psych   Anxiety Depression Bipolar Disorder   negative neurological ROS  negative psych ROS   GI/Hepatic negative GI ROS,GERD  Medicated,,(+)     substance abuse  alcohol use  Endo/Other  diabetes, Well Controlled, Type 1  Morbid obesity  Renal/GU negative Renal ROS  negative genitourinary   Musculoskeletal  (+) Arthritis ,    Abdominal  (+) + obese  Peds negative pediatric ROS (+)  Hematology negative hematology ROS (+)   Anesthesia Other Findings Past Medical History: No date: Alcohol abuse No date: Anxiety No date: Anxiety No date: Anxiety and depression No date: Asthma No date: Bipolar disorder ([place]) No date: Borderline personality disorder ([place]) No date: Depression No date: Diabetes ([place])     Comment:  Type 2  No date: Dyslipidemia No date: Esophageal varices ([name]) No date: Foot drop     Comment:  right No date: 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[place]) No date: GERD (gastroesophageal reflux disease) No date: Gout No date: History of drug abuse ([place]) No date: Insomnia No date: Muscular dystrophies ([place]) No date: Neuropathy No date: Pancreatitis No date: Recovering alcoholic [facility])  Past Surgical History: No date: HERNIA REPAIR 12/30/2020: IR FLUORO GUIDE CV LINE RIGHT 01/19/2021: IR REMOVAL TUN CV CATH W/O FL 12/30/2020: IR US GUIDE VASC ACCESS RIGHT  BMI    Body Mass  Index: 38.27 kg/m      Reproductive/Obstetrics negative OB ROS                             Anesthesia Physical Anesthesia Plan  ASA: 3  Anesthesia Plan: General   Post-op Pain Management:    Induction: Intravenous  PONV Risk Score and Plan: Propofol infusion and TIVA  Airway Management Planned: Natural Airway and Nasal Cannula  Additional Equipment:   Intra-op Plan:   Post-operative Plan:   Informed Consent: I have reviewed the patients History and Physical, chart, labs and discussed the procedure including the risks, benefits and alternatives for the proposed anesthesia with the patient or authorized representative who has indicated his/her understanding and acceptance.     Dental Advisory Given  Plan Discussed with: CRNA and Surgeon  Anesthesia Plan Comments:        Anesthesia Quick Evaluation

## 2022-04-26 ENCOUNTER — Ambulatory Visit: Payer: 59 | Admitting: Neurology

## 2022-04-26 ENCOUNTER — Encounter: Payer: Self-pay | Admitting: Gastroenterology

## 2022-04-26 ENCOUNTER — Telehealth: Payer: Self-pay

## 2022-04-26 NOTE — Telephone Encounter (Signed)
Called patient back and left a voicemail to call us back whenever she wanted to reschedule her procedure.

## 2022-04-26 NOTE — Telephone Encounter (Signed)
She needs to be off all "drugs" for 10 days then we can schedule her as they will check urine on day of procedure to confirm

## 2022-04-26 NOTE — Telephone Encounter (Signed)
Dr. Vicente Males, patient called wanting to reschedule her EGD since she did not pass the drug test. When can I reschedule her EGD? Please advise.

## 2022-04-30 ENCOUNTER — Ambulatory Visit: Payer: 59 | Admitting: Physical Therapy

## 2022-05-02 ENCOUNTER — Ambulatory Visit: Payer: 59 | Admitting: Physical Therapy

## 2022-05-02 NOTE — Telephone Encounter (Signed)
Called patent again and let her know that I was calling her to reschedule her procedure. However, I had to let he know that Dr. Vicente Males was recommending for her to be off drugs 10 days prior to procedure. Patient then stated that she was not scheduling procedure at this time but that she would give [place] a call to reschedule when she was ready.

## 2022-05-08 ENCOUNTER — Ambulatory Visit: Payer: 59 | Admitting: Physical Therapy

## 2022-05-10 ENCOUNTER — Encounter: Payer: Self-pay | Admitting: Physical Therapy

## 2022-05-10 ENCOUNTER — Ambulatory Visit: Payer: 59 | Attending: Internal Medicine | Admitting: Physical Therapy

## 2022-05-10 DIAGNOSIS — R2689 Other abnormalities of gait and mobility: Secondary | ICD-10-CM | POA: Diagnosis present

## 2022-05-10 DIAGNOSIS — M6281 Muscle weakness (generalized): Secondary | ICD-10-CM | POA: Diagnosis present

## 2022-05-10 DIAGNOSIS — Z9181 History of falling: Secondary | ICD-10-CM

## 2022-05-10 NOTE — Therapy (Signed)
OUTPATIENT PHYSICAL THERAPY TREATMENT NOTE   Patient Name: [name] MRN: [id number] DOB:1976/01/24, 47 y.o., female Today's Date: 05/10/2022  PCP: Janine Limbo, PA-C REFERRING PROVIDER: Janine Limbo, PA-C  END OF SESSION:   PT End of Session - 05/10/22 1612     Visit Number 38    Number of Visits 39    Date for PT Re-Evaluation 06/19/22    Authorization Type [employer] MEDICARE/Medicaid reporting period from 03/27/2022    Progress Note Due on Visit 40    PT Start Time 1609    PT Stop Time 1640    PT Time Calculation (min) 31 min    Activity Tolerance Patient limited by pain;Patient limited by fatigue    Behavior During Therapy [facility] for tasks assessed/performed                Past Medical History:  Diagnosis Date   Alcohol abuse    Anxiety    Anxiety    Anxiety and depression    Asthma    Bipolar disorder ([name])    Borderline personality disorder ([place])    Depression    Diabetes ([place])    Type 2    Dyslipidemia    Esophageal varices ([name])    Foot drop    right   [name] ([name])    GERD (gastroesophageal reflux disease)    Gout    History of drug abuse ([place])    Insomnia    Muscular dystrophies ([place])    Neuropathy    Pancreatitis    Recovering alcoholic ([place])    Past Surgical History:  Procedure Laterality Date   ESOPHAGOGASTRODUODENOSCOPY N/A 04/25/2022   Procedure: ESOPHAGOGASTRODUODENOSCOPY (EGD);  Surgeon: Jonathon Bellows, MD;  Location: [facility] ENDOSCOPY;  Service: Gastroenterology;  Laterality: N/A;   HERNIA REPAIR     IR FLUORO GUIDE CV LINE RIGHT  12/30/2020   IR REMOVAL TUN CV CATH W/O FL  01/19/2021   IR US GUIDE VASC ACCESS RIGHT  12/30/2020   Patient Active Problem List   Diagnosis Date Noted   Frequent falls 02/12/2021   Leukocytosis    Aspiration pneumonia of both lower lobes due to gastric secretions ([place]) 01/04/2021   Pressure injury of skin 12/24/2020   Acute hepatic encephalopathy ([place]) 12/20/2020   Sepsis  ([place]) 12/19/2020   Acute hyponatremia 12/18/2020   Alcohol use disorder, moderate, dependence ([place]) 12/18/2020   Hepatic steatosis 12/18/2020   Polysubstance abuse ([place]) 12/18/2020   Hyperglycemia due to type 2 diabetes mellitus ([place]) 12/18/2020   Hyperbilirubinemia 12/18/2020   Lactic acidosis [id number]   Alcoholic liver disease ([place]) 12/18/2020   Bipolar 1 disorder, manic, moderate ([place]) 09/10/2018   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   Diabetes ([place]) 09/10/2018   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[place]) 03/28/2018   Chronic gout of foot 09/29/2015   Muscular dystrophy ([place]) 09/29/2015   Type 2 diabetes mellitus without complication ([place]) [id number]   Anxiety and depression 10/01/2012   Protein-calorie malnutrition, severe ([place]) 09/30/2012   Alcohol abuse 09/26/2012   Trichomonas infection 09/26/2012   Dehydration 09/26/2012   Hypokalemia 11/14/2011   Thrombocytopenia ([place]) 11/14/2011   Alcohol withdrawal ([place]) 11/13/2011   Bipolar 2 disorder ([place]) 11/13/2011   Tobacco abuse 11/13/2011   Cocaine abuse ([place]) 11/13/2011   Gout 11/13/2011     REFERRING DIAG: bilateral leg weakness  THERAPY DIAG:  Other abnormalities of gait and mobility  Muscle weakness (generalized)  History of falling  Rationale for Evaluation and Treatment Rehabilitation  PERTINENT HISTORY:  Patient is a 47 y.o. female who presents to outpatient physical therapy with a referral for medical diagnosis bilateral leg weakness. This patient's chief complaints consist of difficulty with weakness and balance with history of frequent falling leading to the following functional deficits: difficulty with basic mobility including ambulation, stairs, transfers; difficulty with ADLs, IADLs. Currently requires assistance to navigate stairs, assistance with ADLs and  IADLs, and ambulates with RW when she previously did not need an assistive device. Relevant past medical history and comorbidities include  muscular dystrophy (FSHD - facioscapulohumeral muscular dystrophy), anxiety and depression, bipolar 1 disorder, borderline personality disorder, diabetes, GERD, gout, pancreatitis, alcoholic liver disease, tobacco abuse,  foot drop, neuropathy, recovering alcoholic, history of drug abuse, history of hernia repair.  Patient denies hx of cancer, stroke, seizures, lung problems, heart problems, unexplained weight loss, unexplained changes in bowel or bladder problems, unexplained stumbling or dropping things, osteoporosis, and spinal surgery  PRECAUTIONS: Fall, not drink alcohol.  SUBJECTIVE: Patient arrives with rollator. She states she has fallen twice since her last PT session. She had canceled some of her previous appointments due to care problems and changes in her medications. Today she is late because they were pulled over on the way here. She states she fell twice since her last PT session. She fell once when she crossed her feet and got her feet tangled and the second worse fall was when the plastic chair broke under her while she was trying to sweep and the leg stabbed her in the right upper thigh a week ago. She states the scrape is healing but it still feels like someone is stabbing her there.  She states she can tell she has not been at PT and she has not been doing any of her HEP.   PAIN: NPRS 5.25/10 in low back and right glute. Stabbing pain in right upper lateral thigh where the chair stabbed her a week ago when it broke and she fell.   OBJECTIVE  TODAY'S TREATMENT:  Therapeutic exercise: to centralize symptoms and improve ROM, strength, muscular endurance, and activity tolerance required for successful completion of functional activities.  - NuStep level 5 using bilateral upper and lower extremities. Seat/handle setting 9/9. For improved extremity mobility, muscular endurance, and activity tolerance; and to induce the analgesic effect of aerobic exercise, stimulate improved joint  nutrition, and prepare body structures and systems for following interventions. x 5 minutes. (Increased leg soreness). Average SPM = 70. RPE 5/10. - sit <> stand from 19 inch plinth, 4x5, no UE support (RPE ranged from 6/10).  - standing balance with self selected stance on airex pad with horizontal head turns, 4x30 seconds with seated rests between reps  Manual therapy: to reduce pain and tissue tension, improve range of motion, neuromodulation, in order to promote improved ability to complete functional activities. PRONE - STM to lumbar paraspinals and right glute and deep posterior hip muscles as tolerated.   Pt required multimodal cuing for proper technique and to acilitate improved neuromuscular control, strength, range of motion, and functional ability resulting in improved performance and form.     PATIENT EDUCATION:  Education details: form/technique with exercise. Person educated: Patient Education method: Explanation, demonstration, verbal cuing, tactile cuing. Education comprehension: verbalized understanding, demonstrated understanding, and needs further education     HOME EXERCISE PROGRAM: Access Code: [email or web address] URL: [email or web address] Date: 02/15/2022 Prepared by: Rosita Kea  Exercises - Sit to Stand Without Arm Support  - 3 x weekly - 3 sets - 5 reps -  Seated Toe Raise  - 3 x weekly - 3 sets - 15 reps - 1 second hold - Supine Lower Trunk Rotation  - 3 x weekly - 1 sets - 20 reps - 1-5 seconds hold - Prone Press Up  - 3 x weekly - 2 sets - 5 reps  HOME EXERCISE PROGRAM [id number]  Seated marching with weighted twist -  Repeat 10 Times, Hold 1 Second(s), Complete 3 Sets, Perform 1 Times a Day    ASSESSMENT:   CLINICAL IMPRESSION: Patient returns to PT after over 3 weeks away from PT due to health and transportation issues. She continues to be limited by right low back and glute pain and was limited by decreased activity tolerance today. Plan to  continue with strengthening, balance, and pain control as appropriate at future sessions. Patient would benefit from continued management of limiting condition by skilled physical therapist to address remaining impairments and functional limitations to work towards stated goals and return to PLOF or maximal functional independence.    From PT Eval 07/10/2021:  Patient is a 47 y.o. female referred to outpatient physical therapy with a medical diagnosis of bilateral leg weakness who presents with signs and symptoms consistent with generalized weakness, balance deficits, and motor control problems likely related to deconditioning and FSHD muscular dystrophy. Patient presents with significant balance, motor control, posture, postural, gait, pain, muscle performance (strength/power/endurance) and activity tolerance impairments that are limiting ability to complete basic mobility and self care including ambulation, stairs, transfers, ADLs, and IADLs without difficulty. At eval requires assistance to navigate stairs, assistance with ADLs and  IADLs, and ambulates with RW when she previously did not need an assistive device. Patient would likely benefit from bilateral AFOs to address foot drop found at both ankles and improve her steppage gait. Patient will benefit from skilled physical therapy intervention to address current body structure impairments and activity limitations to improve function and work towards goals set in current POC in order to return to prior level of function or maximal functional improvement.   OBJECTIVE IMPAIRMENTS Abnormal gait, decreased activity tolerance, decreased balance, decreased coordination, decreased endurance, decreased mobility, difficulty walking, decreased ROM, decreased strength, impaired perceived functional ability, increased muscle spasms, impaired tone, impaired UE functional use, improper body mechanics, postural dysfunction, and pain.    ACTIVITY LIMITATIONS cleaning,  community activity, driving, meal prep, laundry, and shopping.    PERSONAL FACTORS Behavior pattern, Fitness, Past/current experiences, Time since onset of injury/illness/exacerbation, Transportation, and 3+ comorbidities:    muscular dystrophy (FSHD - facioscapulohumeral muscular dystrophy), anxiety and depression, bipolar 1 disorder, borderline personality disorder, diabetes, GERD, gout, pancreatitis, alcoholic liver disease, tobacco abuse,  foot drop, neuropathy, recovering alcoholic, history of drug abuse, history of hernia repair are also affecting patient's functional outcome.      REHAB POTENTIAL: Good   CLINICAL DECISION MAKING: Evolving/moderate complexity   EVALUATION COMPLEXITY: Moderate     GOALS: Goals reviewed with patient? No   SHORT TERM GOALS: Target date: 07/24/2021   Patient will be independent with initial home exercise program for self-management of symptoms. Baseline: Initial HEP to be provided at visit 2 as appropriate (07/10/21); Goal status: Met     LONG TERM GOALS: Target date: 10/02/2021; updated to 12/27/2021 for all unmet goals on 10/04/2021; updated to 03/26/2022 for all unmet goals on 01/01/2022; updated to 06/19/2022 for all unmet goals on 03/27/2022.    Patient will be independent with a long-term home exercise program for self-management of symptoms.  Baseline: Initial HEP  to be provided at visit 2 as appropriate (07/10/21); participating as tolerated, recently too much pain to participate (09/04/2021); participating regularly (10/04/2021); participating as able (11/21/2021); not participating regularly over last couple of weeks (01/01/2022); no participating regularly since last progress note (02/15/2022); improving participation (03/27/2022);  Goal status: Ongoing   2.  Patient will demonstrate improved FOTO score by 10 points from baseline to demonstrate improvement in overall condition and self-reported functional ability.  Baseline: to be measured visit 2 as  appropriate (07/10/21); 47 at visit # 2 (07/17/2021); 50 at visit # 10 (09/04/2021); 53 at visit #15 (10/04/2021); 50 at visit # 20 (11/21/2021); 52 at visit # 26 (01/01/2022); 50 at visit #30 (02/15/2022); 54 at visit # 34 (03/27/22);  Goal status: In-Progress   3.  Patient will ambulate equal or greater than 1000 feet during 6 Min Walk Test using [facility] or less restrictive assistive device to improve community ambulation.  Baseline: To be tested visit 2 as appropriate (07/10/21); 310 feet with RW (07/17/2021); 556 feet with rollator (09/04/2021); 600 feet with rollator (10/04/2021); 300 feet with rollator - had to stop test at 3 min due to intolerable right glute pain (11/21/2021); 420 feet with rollator and steppage gait, R > L. Stopped at 5 min due to feeling of instability in low back to mid thoracic spine (01/01/2022); 573 feet with rollator and steppage gait, R > L. Occasional standing breaks. Low back pain increased to 3.5/10 (02/15/2022);  381 feet with rollator and steppage gait, R > L. Stopped at 4:30 min due to pain in low back and right glute (03/27/2022);  Goal status: In-Progress   4.  Patient will complete 5 Time Sit To Stand test in equal or less than 20 seconds with no UE support from 18.5 inch plinth to demonstrate improved LE functional strength and power for basic mobility and improved fall risk.  Baseline: 24 seconds with B UE on 18.5 inch plinth.  (07/10/21); 20 seconds with B UE on thighs from 18.5 inch plinth (09/04/2021); 19 seconds with B UE on thighs from 18.5 inch plinth (10/04/2021); 22 seconds with B UE on thighs from 18.5 inch plinth (11/21/2021); 20 seconds with B UE on thighs from 18.5 inch plinth (01/01/2022); 19 seconds without use of B UE support (02/15/2022); 21 seconds with no UE support from 18.5 inch plinth (03/27/2022);      5.  Patient be able to ascend and descend stairs at her home mod I with use of B handrails.  Baseline: able to ascend/descend 4 steps in the clinic with B UE support  on handrails and supervision - reports currently needs assistance to descend steps at home. (07/10/21); able to ascend/descend 4 steps in the clinic with B UE support on handrails and supervision, leading up and down with left. able to perform with reversed LE leads (09/04/2021); able to ascend/descend 4 steps in the clinic with B UE support on handrails and supervision, step over step with heavy UE use on the way down (10/04/2021); able to ascend/descend 4 steps in the clinic with B UE support on handrails and supervision, step over step up and step to leading with R LE with heavy UE use on the way down (11/21/2021); ascend/descends 4 steps in the clinic with B UE support on handrails and supervision, step over step up and down with heavy UE use on the way down (01/01/2022); able to ascend/descend 4 steps in the clinic with B UE support on handrails and supervision, step over step  up and down with heavy UE use on the way down. leads up with right foot step to gait (02/15/2022; 03/27/22);  Goal status: MET   6.  Patient will demonstrate ability to perform static balance in tandem stance for equal or greater than 30 seconds on each side, to demonstrate improved balance and decreased fall risk.  Baseline: right foot front 10 seconds, left foot front 7 seconds (07/10/2021); right foot front 4 seconds, left foot front 6 seconds (09/04/2021); right foot front 9 seconds, left foot front 31 seconds (10/04/2021); right foot front 6.5 seconds, left foot front 8.5 seconds (11/21/2021); right foot front 8 seconds, left foot front 7 seconds (01/01/2022);  right foot front 9 seconds, left foot front 20 seconds (02/15/2022); right foot front 30 seconds, left foot front 45 seconds (03/27/2022);  Goal status: MET 03/27/22     PLAN: PT FREQUENCY: 2x/week   PT DURATION: 12 weeks   PLANNED INTERVENTIONS: Therapeutic exercises, Therapeutic activity, Neuromuscular re-education, Balance training, Gait training, Patient/Family education,  Joint mobilization, Orthotic/Fit training, DME instructions, Dry Needling, Electrical stimulation, Cryotherapy, Moist heat, Splintting, Taping, and Manual therapy   PLAN FOR NEXT SESSION: continue with moderate intensity functional strengthening and balance exercises as able, address back and right glute pain to allow return to functional strengthening and balance with better tolerance.      Everlean Alstrom. Graylon Good, PT, DPT 05/10/22, 4:41 PM  [facility] [address] [place], [place] [address] P: [phone] I F: [phone]

## 2022-05-15 ENCOUNTER — Ambulatory Visit: Payer: 59 | Admitting: Physical Therapy

## 2022-05-17 ENCOUNTER — Ambulatory Visit: Payer: 59

## 2022-05-17 ENCOUNTER — Encounter: Payer: Self-pay | Admitting: Physical Therapy

## 2022-05-17 DIAGNOSIS — M6281 Muscle weakness (generalized): Secondary | ICD-10-CM

## 2022-05-17 DIAGNOSIS — R2689 Other abnormalities of gait and mobility: Secondary | ICD-10-CM

## 2022-05-17 DIAGNOSIS — Z9181 History of falling: Secondary | ICD-10-CM

## 2022-05-17 NOTE — Therapy (Signed)
OUTPATIENT PHYSICAL THERAPY TREATMENT NOTE/PROGRESS NOTE  Dates of Reporting Period: 02/16/23 - 05/17/22  Patient Name: [name] MRN: [id number] DOB:10-12-1975, 47 y.o., female Today's Date: 05/17/2022  PCP: Janine Limbo, PA-C REFERRING PROVIDER: Janine Limbo, PA-C  END OF SESSION:   PT End of Session - 05/17/22 1434     Visit Number 39    Number of Visits 77    Date for PT Re-Evaluation 06/19/22    Authorization Type [employer] MEDICARE/Medicaid reporting period from 03/27/2022    Progress Note Due on Visit 40    PT Start Time 1432    PT Stop Time 1515    PT Time Calculation (min) 43 min    Activity Tolerance Patient limited by pain;Patient limited by fatigue    Behavior During Therapy [facility] for tasks assessed/performed                Past Medical History:  Diagnosis Date   Alcohol abuse    Anxiety    Anxiety    Anxiety and depression    Asthma    Bipolar disorder ([name])    Borderline personality disorder ([place])    Depression    Diabetes ([place])    Type 2    Dyslipidemia    Esophageal varices ([name])    Foot drop    right   [name] ([name])    GERD (gastroesophageal reflux disease)    Gout    History of drug abuse ()    Insomnia    Muscular dystrophies ([place])    Neuropathy    Pancreatitis    Recovering alcoholic ([place])    Past Surgical History:  Procedure Laterality Date   ESOPHAGOGASTRODUODENOSCOPY N/A 04/25/2022   Procedure: ESOPHAGOGASTRODUODENOSCOPY (EGD);  Surgeon: Jonathon Bellows, MD;  Location: [facility] ENDOSCOPY;  Service: Gastroenterology;  Laterality: N/A;   HERNIA REPAIR     IR FLUORO GUIDE CV LINE RIGHT  12/30/2020   IR REMOVAL TUN CV CATH W/O FL  01/19/2021   IR US GUIDE VASC ACCESS RIGHT  12/30/2020   Patient Active Problem List   Diagnosis Date Noted   Frequent falls 02/12/2021   Leukocytosis    Aspiration pneumonia of both lower lobes due to gastric secretions ([place]) 01/04/2021   Pressure injury of skin 12/24/2020    Acute hepatic encephalopathy ([place]) 12/20/2020   Sepsis ([place]) 12/19/2020   Acute hyponatremia 12/18/2020   Alcohol use disorder, moderate, dependence ([place]) 12/18/2020   Hepatic steatosis 12/18/2020   Polysubstance abuse ([place]) 12/18/2020   Hyperglycemia due to type 2 diabetes mellitus ([place]) 12/18/2020   Hyperbilirubinemia 12/18/2020   Lactic acidosis [id number]   Alcoholic liver disease ([place]) 12/18/2020   Bipolar 1 disorder, manic, moderate ([place]) 09/10/2018   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   Diabetes ([place]) 09/10/2018   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[place]) 03/28/2018   Chronic gout of foot 09/29/2015   Muscular dystrophy ([place]) 09/29/2015   Type 2 diabetes mellitus without complication ([place]) [id number]   Anxiety and depression 10/01/2012   Protein-calorie malnutrition, severe ([place]) 09/30/2012   Alcohol abuse 09/26/2012   Trichomonas infection 09/26/2012   Dehydration 09/26/2012   Hypokalemia 11/14/2011   Thrombocytopenia ([place]) 11/14/2011   Alcohol withdrawal ([place]) 11/13/2011   Bipolar 2 disorder ([place]) 11/13/2011   Tobacco abuse 11/13/2011   Cocaine abuse ([place]) 11/13/2011   Gout 11/13/2011     REFERRING DIAG: bilateral leg weakness  THERAPY DIAG:  Other abnormalities of gait and mobility  Muscle weakness (generalized)  History of falling  Rationale  for Evaluation and Treatment Rehabilitation  PERTINENT HISTORY: Patient is a 47 y.o. female who presents to outpatient physical therapy with a referral for medical diagnosis bilateral leg weakness. This patient's chief complaints consist of difficulty with weakness and balance with history of frequent falling leading to the following functional deficits: difficulty with basic mobility including ambulation, stairs, transfers; difficulty with ADLs, IADLs. Currently requires assistance to navigate stairs, assistance with ADLs and  IADLs, and ambulates with RW when she previously did not need an assistive device.  Relevant past medical history and comorbidities include muscular dystrophy (FSHD - facioscapulohumeral muscular dystrophy), anxiety and depression, bipolar 1 disorder, borderline personality disorder, diabetes, GERD, gout, pancreatitis, alcoholic liver disease, tobacco abuse,  foot drop, neuropathy, recovering alcoholic, history of drug abuse, history of hernia repair.  Patient denies hx of cancer, stroke, seizures, lung problems, heart problems, unexplained weight loss, unexplained changes in bowel or bladder problems, unexplained stumbling or dropping things, osteoporosis, and spinal surgery  PRECAUTIONS: Fall, not drink alcohol.  SUBJECTIVE: Patient arrives with rollator. Reports family medical issues limiting participation. RLE pain is better. No falls.     PAIN: NPRS 4/10 in low back and right glute.  OBJECTIVE  TODAY'S TREATMENT:    There.ex:     Nu-Step L3 for 5 min UE/LE use for lumbar mobility and gentle LE strengthening.  Review of goals. See goals section for details.        Hook lying:      Glut bridge + isometric: 3x6      Hip flexion + abduction over hurdles alternating: 3x6/LE      Hip adduction blue squishy ball squeeze + LAQ: 3x6/LE       Seated D2 flexion with 2KG med ball: 2x6. PT demo.      Pt required multimodal cuing for proper technique and to acilitate improved neuromuscular control, strength, range of motion, and functional ability resulting in improved performance and form.     PATIENT EDUCATION:  Education details: form/technique with exercise. Person educated: Patient Education method: Explanation, demonstration, verbal cuing, tactile cuing. Education comprehension: verbalized understanding, demonstrated understanding, and needs further education     HOME EXERCISE PROGRAM: Access Code: [email or web address] URL: [email or web address] Date: 02/15/2022 Prepared by: Rosita Kea  Exercises - Sit to Stand Without Arm Support  - 3 x weekly - 3 sets  - 5 reps - Seated Toe Raise  - 3 x weekly - 3 sets - 15 reps - 1 second hold - Supine Lower Trunk Rotation  - 3 x weekly - 1 sets - 20 reps - 1-5 seconds hold - Prone Press Up  - 3 x weekly - 2 sets - 5 reps  HOME EXERCISE PROGRAM [id number]  Seated marching with weighted twist -  Repeat 10 Times, Hold 1 Second(s), Complete 3 Sets, Perform 1 Times a Day    ASSESSMENT:   CLINICAL IMPRESSION: Progress note performed today as pt close to 40 sessions and has been absent for ~3-4 weeks from PT. Thus goals assessed. Pt has been non-compliant with HEP since last fall, remains ISQ with FOTO score, and onset of R LBP and hip pain exacerbated during 6MWT leading to termination. Pt however does demo some strength gains via 5xSTS time from lowered surface and does not rely on BUE support. Encouraged pt to f/u with PT per POC as able as pt has had difficulty coming to PT for personal family reasons and transportation problems but pt is aware she would benefit with consistent turn out to  PT. Patient's condition has the potential to improve in response to therapy. Maximum improvement is yet to be obtained. The anticipated improvement is attainable and reasonable in a generally predictable time. Patient would benefit from continued management of limiting condition by skilled physical therapist to address remaining impairments and functional limitations to work towards stated goals and return to PLOF or maximal functional independence.     From PT Eval 07/10/2021:  Patient is a 47 y.o. female referred to outpatient physical therapy with a medical diagnosis of bilateral leg weakness who presents with signs and symptoms consistent with generalized weakness, balance deficits, and motor control problems likely related to deconditioning and FSHD muscular dystrophy. Patient presents with significant balance, motor control, posture, postural, gait, pain, muscle performance (strength/power/endurance) and activity tolerance  impairments that are limiting ability to complete basic mobility and self care including ambulation, stairs, transfers, ADLs, and IADLs without difficulty. At eval requires assistance to navigate stairs, assistance with ADLs and  IADLs, and ambulates with RW when she previously did not need an assistive device. Patient would likely benefit from bilateral AFOs to address foot drop found at both ankles and improve her steppage gait. Patient will benefit from skilled physical therapy intervention to address current body structure impairments and activity limitations to improve function and work towards goals set in current POC in order to return to prior level of function or maximal functional improvement.   OBJECTIVE IMPAIRMENTS Abnormal gait, decreased activity tolerance, decreased balance, decreased coordination, decreased endurance, decreased mobility, difficulty walking, decreased ROM, decreased strength, impaired perceived functional ability, increased muscle spasms, impaired tone, impaired UE functional use, improper body mechanics, postural dysfunction, and pain.    ACTIVITY LIMITATIONS cleaning, community activity, driving, meal prep, laundry, and shopping.    PERSONAL FACTORS Behavior pattern, Fitness, Past/current experiences, Time since onset of injury/illness/exacerbation, Transportation, and 3+ comorbidities:    muscular dystrophy (FSHD - facioscapulohumeral muscular dystrophy), anxiety and depression, bipolar 1 disorder, borderline personality disorder, diabetes, GERD, gout, pancreatitis, alcoholic liver disease, tobacco abuse,  foot drop, neuropathy, recovering alcoholic, history of drug abuse, history of hernia repair are also affecting patient's functional outcome.      REHAB POTENTIAL: Good   CLINICAL DECISION MAKING: Evolving/moderate complexity   EVALUATION COMPLEXITY: Moderate     GOALS: Goals reviewed with patient? No   SHORT TERM GOALS: Target date: 07/24/2021   Patient will  be independent with initial home exercise program for self-management of symptoms. Baseline: Initial HEP to be provided at visit 2 as appropriate (07/10/21); Goal status: Met     LONG TERM GOALS: Target date: 10/02/2021; updated to 12/27/2021 for all unmet goals on 10/04/2021; updated to 03/26/2022 for all unmet goals on 01/01/2022; updated to 06/19/2022 for all unmet goals on 03/27/2022.    Patient will be independent with a long-term home exercise program for self-management of symptoms.  Baseline: Initial HEP to be provided at visit 2 as appropriate (07/10/21); participating as tolerated, recently too much pain to participate (09/04/2021); participating regularly (10/04/2021); participating as able (11/21/2021); not participating regularly over last couple of weeks (01/01/2022); no participating regularly since last progress note (02/15/2022); improving participation (03/27/2022); has not last two weeks due to recent fall (05/17/22) Goal status: Ongoing   2.  Patient will demonstrate improved FOTO score by 10 points from baseline to demonstrate improvement in overall condition and self-reported functional ability.  Baseline: to be measured visit 2 as appropriate (07/10/21); 47 at visit # 2 (07/17/2021); 50 at visit # 10 (09/04/2021); 53  at visit #15 (10/04/2021); 50 at visit # 20 (11/21/2021); 52 at visit # 26 (01/01/2022); 50 at visit #30 (02/15/2022); 54 at visit # 34 (03/27/22);  53 (05/17/22) Goal status: In-Progress   3.  Patient will ambulate equal or greater than 1000 feet during 6 Min Walk Test using [facility] or less restrictive assistive device to improve community ambulation.  Baseline: To be tested visit 2 as appropriate (07/10/21); 310 feet with RW (07/17/2021); 556 feet with rollator (09/04/2021); 600 feet with rollator (10/04/2021); 300 feet with rollator - had to stop test at 3 min due to intolerable right glute pain (11/21/2021); 420 feet with rollator and steppage gait, R > L. Stopped at 5 min due to feeling of  instability in low back to mid thoracic spine (01/01/2022); 573 feet with rollator and steppage gait, R > L. Occasional standing breaks. Low back pain increased to 3.5/10 (02/15/2022);  381 feet with rollator and steppage gait, R > L. Stopped at 4:30 min due to pain in low back and right glute (03/27/2022); 124' in 1 minute 22 sec. Had to discontinue due to R back/hip pain.  Goal status: In-Progress   4.  Patient will complete 5 Time Sit To Stand test in equal or less than 20 seconds with no UE support from 18.5 inch plinth to demonstrate improved LE functional strength and power for basic mobility and improved fall risk.  Baseline: 24 seconds with B UE on 18.5 inch plinth.  (07/10/21); 20 seconds with B UE on thighs from 18.5 inch plinth (09/04/2021); 19 seconds with B UE on thighs from 18.5 inch plinth (10/04/2021); 22 seconds with B UE on thighs from 18.5 inch plinth (11/21/2021); 20 seconds with B UE on thighs from 18.5 inch plinth (01/01/2022); 19 seconds without use of B UE support (02/15/2022); 21 seconds with no UE support from 18.5 inch plinth (03/27/2022); 20.5 seconds no UE support and 18" surface  Goal status: in progress     5.  Patient be able to ascend and descend stairs at her home mod I with use of B handrails.  Baseline: able to ascend/descend 4 steps in the clinic with B UE support on handrails and supervision - reports currently needs assistance to descend steps at home. (07/10/21); able to ascend/descend 4 steps in the clinic with B UE support on handrails and supervision, leading up and down with left. able to perform with reversed LE leads (09/04/2021); able to ascend/descend 4 steps in the clinic with B UE support on handrails and supervision, step over step with heavy UE use on the way down (10/04/2021); able to ascend/descend 4 steps in the clinic with B UE support on handrails and supervision, step over step up and step to leading with R LE with heavy UE use on the way down (11/21/2021);  ascend/descends 4 steps in the clinic with B UE support on handrails and supervision, step over step up and down with heavy UE use on the way down (01/01/2022); able to ascend/descend 4 steps in the clinic with B UE support on handrails and supervision, step over step up and down with heavy UE use on the way down. leads up with right foot step to gait (02/15/2022; 03/27/22);  Goal status: MET   6.  Patient will demonstrate ability to perform static balance in tandem stance for equal or greater than 30 seconds on each side, to demonstrate improved balance and decreased fall risk.  Baseline: right foot front 10 seconds, left foot front 7 seconds (  07/10/2021); right foot front 4 seconds, left foot front 6 seconds (09/04/2021); right foot front 9 seconds, left foot front 31 seconds (10/04/2021); right foot front 6.5 seconds, left foot front 8.5 seconds (11/21/2021); right foot front 8 seconds, left foot front 7 seconds (01/01/2022);  right foot front 9 seconds, left foot front 20 seconds (02/15/2022); right foot front 30 seconds, left foot front 45 seconds (03/27/2022);  Goal status: MET 03/27/22     PLAN: PT FREQUENCY: 2x/week   PT DURATION: 12 weeks   PLANNED INTERVENTIONS: Therapeutic exercises, Therapeutic activity, Neuromuscular re-education, Balance training, Gait training, Patient/Family education, Joint mobilization, Orthotic/Fit training, DME instructions, Dry Needling, Electrical stimulation, Cryotherapy, Moist heat, Splintting, Taping, and Manual therapy   PLAN FOR NEXT SESSION: continue with moderate intensity functional strengthening and balance exercises as able, address back and right glute pain to allow return to functional strengthening and balance with better tolerance.    Salem Caster. Fairly IV, PT, DPT Physical Therapist- [facility] 05/17/22, 3:41 PM

## 2022-05-22 ENCOUNTER — Ambulatory Visit: Payer: 59 | Attending: Internal Medicine | Admitting: Physical Therapy

## 2022-05-22 ENCOUNTER — Encounter: Payer: Self-pay | Admitting: Physical Therapy

## 2022-05-22 DIAGNOSIS — R2689 Other abnormalities of gait and mobility: Secondary | ICD-10-CM

## 2022-05-22 DIAGNOSIS — Z9181 History of falling: Secondary | ICD-10-CM | POA: Diagnosis present

## 2022-05-22 DIAGNOSIS — M6281 Muscle weakness (generalized): Secondary | ICD-10-CM | POA: Diagnosis present

## 2022-05-22 NOTE — Therapy (Signed)
OUTPATIENT PHYSICAL THERAPY TREATMENT NOTE/PROGRESS NOTE  Dates of Reporting Period: 02/16/23 - 05/22/2022  Patient Name: [name] MRN: [id number] DOB:1975-05-16, 47 y.o., female Today's Date: 05/22/2022  PCP: Janine Limbo, PA-C REFERRING PROVIDER: Janine Limbo, PA-C  END OF SESSION:   PT End of Session - 05/22/22 1732     Visit Number 40    Number of Visits 63    Date for PT Re-Evaluation 08/14/22    Authorization Type [employer] MEDICARE/Medicaid reporting period from 03/27/2022    Progress Note Due on Visit 40    PT Start Time 1730    PT Stop Time 1810    PT Time Calculation (min) 40 min    Activity Tolerance Patient limited by pain;Patient limited by fatigue    Behavior During Therapy [facility] for tasks assessed/performed             Past Medical History:  Diagnosis Date   Alcohol abuse    Anxiety    Anxiety    Anxiety and depression    Asthma    Bipolar disorder ([name])    Borderline personality disorder ([place])    Depression    Diabetes ([place])    Type 2    Dyslipidemia    Esophageal varices ([name])    Foot drop    right   [name] ([name])    GERD (gastroesophageal reflux disease)    Gout    History of drug abuse ([place])    Insomnia    Muscular dystrophies ([place])    Neuropathy    Pancreatitis    Recovering alcoholic ([place])    Past Surgical History:  Procedure Laterality Date   ESOPHAGOGASTRODUODENOSCOPY N/A 04/25/2022   Procedure: ESOPHAGOGASTRODUODENOSCOPY (EGD);  Surgeon: Jonathon Bellows, MD;  Location: [facility] ENDOSCOPY;  Service: Gastroenterology;  Laterality: N/A;   HERNIA REPAIR     IR FLUORO GUIDE CV LINE RIGHT  12/30/2020   IR REMOVAL TUN CV CATH W/O FL  01/19/2021   IR US GUIDE VASC ACCESS RIGHT  12/30/2020   Patient Active Problem List   Diagnosis Date Noted   Frequent falls 02/12/2021   Leukocytosis    Aspiration pneumonia of both lower lobes due to gastric secretions ([place]) 01/04/2021   Pressure injury of skin 12/24/2020   Acute  hepatic encephalopathy ([place]) 12/20/2020   Sepsis ([place]) 12/19/2020   Acute hyponatremia 12/18/2020   Alcohol use disorder, moderate, dependence ([place]) 12/18/2020   Hepatic steatosis 12/18/2020   Polysubstance abuse ([place]) 12/18/2020   Hyperglycemia due to type 2 diabetes mellitus ([place]) 12/18/2020   Hyperbilirubinemia 12/18/2020   Lactic acidosis [id number]   Alcoholic liver disease ([place]) 12/18/2020   Bipolar 1 disorder, manic, moderate ([place]) 09/10/2018   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   Diabetes ([place]) 09/10/2018   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[place]) 03/28/2018   Chronic gout of foot 09/29/2015   Muscular dystrophy ([place]) 09/29/2015   Type 2 diabetes mellitus without complication ([place]) [id number]   Anxiety and depression 10/01/2012   Protein-calorie malnutrition, severe ([place]) 09/30/2012   Alcohol abuse 09/26/2012   Trichomonas infection 09/26/2012   Dehydration 09/26/2012   Hypokalemia 11/14/2011   Thrombocytopenia ([place]) 11/14/2011   Alcohol withdrawal ([place]) 11/13/2011   Bipolar 2 disorder ([place]) 11/13/2011   Tobacco abuse 11/13/2011   Cocaine abuse ([place]) 11/13/2011   Gout 11/13/2011     REFERRING DIAG: bilateral leg weakness  THERAPY DIAG:  Other abnormalities of gait and mobility  Muscle weakness (generalized)  History of falling  Rationale for Evaluation and  Treatment Rehabilitation  PERTINENT HISTORY: Patient is a 47 y.o. female who presents to outpatient physical therapy with a referral for medical diagnosis bilateral leg weakness. This patient's chief complaints consist of difficulty with weakness and balance with history of frequent falling leading to the following functional deficits: difficulty with basic mobility including ambulation, stairs, transfers; difficulty with ADLs, IADLs. Currently requires assistance to navigate stairs, assistance with ADLs and  IADLs, and ambulates with RW when she previously did not need an assistive device. Relevant  past medical history and comorbidities include muscular dystrophy (FSHD - facioscapulohumeral muscular dystrophy), anxiety and depression, bipolar 1 disorder, borderline personality disorder, diabetes, GERD, gout, pancreatitis, alcoholic liver disease, tobacco abuse,  foot drop, neuropathy, recovering alcoholic, history of drug abuse, history of hernia repair.  Patient denies hx of cancer, stroke, seizures, lung problems, heart problems, unexplained weight loss, unexplained changes in bowel or bladder problems, unexplained stumbling or dropping things, osteoporosis, and spinal surgery  PRECAUTIONS: Fall, not drink alcohol.  SUBJECTIVE: Patient arrives with rollator. She states not falls since last PT session. She states her left LE suddenly kicked out and across her body when she was laying on her right side. She states she is sore after last PT session and was too sore to continue her HEP since last PT session. Patient reports the region from the top of your hips to the bottom of her shoulder blades feel unstable.   PAIN: NPRS 5.75/10 in low back and right glute  and 4.75/10 in left glute. .  OBJECTIVE  TODAY'S TREATMENT: Therapeutic exercise: to centralize symptoms and improve ROM, strength, muscular endurance, and activity tolerance required for successful completion of functional activities.  - NuStep level 5-2 using bilateral upper and lower extremities. Seat/handle setting 9/9. For improved extremity mobility, muscular endurance, and activity tolerance; and to induce the analgesic effect of aerobic exercise, stimulate improved joint nutrition, and prepare body structures and systems for following interventions. x 4 minutes. (Increased leg soreness). Average SPM = 70. RPE 5/10.     Hooklying: Circuit:  - Glute isometric/bridge (minimal lift) isometric hold 5 seconds: 3x6  - Hip flexion + abduction over hurdles alternating: 3x6/LE  - Hip adduction blue squishy ball squeeze + LAQ: 3x6/LE     -  low trunk rotation with knees towards chest holding small blue ball between knees, 3x3-5 each direction.  - curl up, 3x6 with 5 second holds.     Seated: - B UE D2 flexion with 2KG med ball: 2x6. Good carry over   Pt required multimodal cuing for proper technique and to acilitate improved neuromuscular control, strength, range of motion, and functional ability resulting in improved performance and form.     PATIENT EDUCATION:  Education details: form/technique with exercise. Person educated: Patient Education method: Explanation, demonstration, verbal cuing, tactile cuing. Education comprehension: verbalized understanding, demonstrated understanding, and needs further education     HOME EXERCISE PROGRAM: Access Code: [email or web address] URL: [email or web address] Date: 02/15/2022 Prepared by: Rosita Kea  Exercises - Sit to Stand Without Arm Support  - 3 x weekly - 3 sets - 5 reps - Seated Toe Raise  - 3 x weekly - 3 sets - 15 reps - 1 second hold - Supine Lower Trunk Rotation  - 3 x weekly - 1 sets - 20 reps - 1-5 seconds hold - Prone Press Up  - 3 x weekly - 2 sets - 5 reps  HOME EXERCISE PROGRAM [id number]  Seated marching with weighted twist -  Repeat 10 Times,  Hold 1 Second(s), Complete 3 Sets, Perform 1 Times a Day    ASSESSMENT:   CLINICAL IMPRESSION: Patient has attended 40 physical therapy sessions since starting current episode of care on 07/10/2021: Patient came back to PT last week after being absent for ~3-4 weeks from PT. Thus goals assessed that day.  Pt has been non-compliant with HEP since last fall, remains ISQ with FOTO score, and onset of R LBP and hip pain exacerbated during 6MWT leading to termination. Pt however does demo some strength gains via 5xSTS time from lowered surface and does not rely on BUE support. Continued to encourage pt to f/u with PT per POC as able as pt has had difficulty coming to PT for personal family reasons and transportation problems  but pt is aware she would benefit with consistent turn out to PT. Patient's condition has the potential to improve in response to therapy. Maximum improvement is yet to be obtained. The anticipated improvement is attainable and reasonable in a generally predictable time. Patient would benefit from continued management of limiting condition by skilled physical therapist to address remaining impairments and functional limitations to work towards stated goals and return to PLOF or maximal functional independence.    From PT Eval 07/10/2021:  Patient is a 47 y.o. female referred to outpatient physical therapy with a medical diagnosis of bilateral leg weakness who presents with signs and symptoms consistent with generalized weakness, balance deficits, and motor control problems likely related to deconditioning and FSHD muscular dystrophy. Patient presents with significant balance, motor control, posture, postural, gait, pain, muscle performance (strength/power/endurance) and activity tolerance impairments that are limiting ability to complete basic mobility and self care including ambulation, stairs, transfers, ADLs, and IADLs without difficulty. At eval requires assistance to navigate stairs, assistance with ADLs and  IADLs, and ambulates with RW when she previously did not need an assistive device. Patient would likely benefit from bilateral AFOs to address foot drop found at both ankles and improve her steppage gait. Patient will benefit from skilled physical therapy intervention to address current body structure impairments and activity limitations to improve function and work towards goals set in current POC in order to return to prior level of function or maximal functional improvement.   OBJECTIVE IMPAIRMENTS Abnormal gait, decreased activity tolerance, decreased balance, decreased coordination, decreased endurance, decreased mobility, difficulty walking, decreased ROM, decreased strength, impaired perceived  functional ability, increased muscle spasms, impaired tone, impaired UE functional use, improper body mechanics, postural dysfunction, and pain.    ACTIVITY LIMITATIONS cleaning, community activity, driving, meal prep, laundry, and shopping.    PERSONAL FACTORS Behavior pattern, Fitness, Past/current experiences, Time since onset of injury/illness/exacerbation, Transportation, and 3+ comorbidities:    muscular dystrophy (FSHD - facioscapulohumeral muscular dystrophy), anxiety and depression, bipolar 1 disorder, borderline personality disorder, diabetes, GERD, gout, pancreatitis, alcoholic liver disease, tobacco abuse,  foot drop, neuropathy, recovering alcoholic, history of drug abuse, history of hernia repair are also affecting patient's functional outcome.      REHAB POTENTIAL: Good   CLINICAL DECISION MAKING: Evolving/moderate complexity   EVALUATION COMPLEXITY: Moderate     GOALS: Goals reviewed with patient? No   SHORT TERM GOALS: Target date: 07/24/2021   Patient will be independent with initial home exercise program for self-management of symptoms. Baseline: Initial HEP to be provided at visit 2 as appropriate (07/10/21); Goal status: Met     LONG TERM GOALS: Target date: 10/02/2021; updated to 12/27/2021 for all unmet goals on 10/04/2021; updated to 03/26/2022 for all  unmet goals on 01/01/2022; updated to 06/19/2022 for all unmet goals on 03/27/2022; updated to 08/14/2022 for all unmet goals on 05/22/2022.    Patient will be independent with a long-term home exercise program for self-management of symptoms.  Baseline: Initial HEP to be provided at visit 2 as appropriate (07/10/21); participating as tolerated, recently too much pain to participate (09/04/2021); participating regularly (10/04/2021); participating as able (11/21/2021); not participating regularly over last couple of weeks (01/01/2022); no participating regularly since last progress note (02/15/2022); improving participation  (03/27/2022); has not last two weeks due to recent fall (05/17/22);  Goal status: Ongoing   2.  Patient will demonstrate improved FOTO score by 10 points from baseline to demonstrate improvement in overall condition and self-reported functional ability.  Baseline: to be measured visit 2 as appropriate (07/10/21); 47 at visit # 2 (07/17/2021); 50 at visit # 10 (09/04/2021); 53 at visit #15 (10/04/2021); 50 at visit # 20 (11/21/2021); 52 at visit # 26 (01/01/2022); 50 at visit #30 (02/15/2022); 54 at visit # 34 (03/27/22);  53 (05/17/22); Goal status: In-Progress   3.  Patient will ambulate equal or greater than 1000 feet during 6 Min Walk Test using [facility] or less restrictive assistive device to improve community ambulation.  Baseline: To be tested visit 2 as appropriate (07/10/21); 310 feet with RW (07/17/2021); 556 feet with rollator (09/04/2021); 600 feet with rollator (10/04/2021); 300 feet with rollator - had to stop test at 3 min due to intolerable right glute pain (11/21/2021); 420 feet with rollator and steppage gait, R > L. Stopped at 5 min due to feeling of instability in low back to mid thoracic spine (01/01/2022); 573 feet with rollator and steppage gait, R > L. Occasional standing breaks. Low back pain increased to 3.5/10 (02/15/2022);  381 feet with rollator and steppage gait, R > L. Stopped at 4:30 min due to pain in low back and right glute (03/27/2022); 124' in 1 minute 22 sec. Had to discontinue due to R back/hip pain (05/17/2022);  Goal status: In-Progress   4.  Patient will complete 5 Time Sit To Stand test in equal or less than 20 seconds with no UE support from 18.5 inch plinth to demonstrate improved LE functional strength and power for basic mobility and improved fall risk.  Baseline: 24 seconds with B UE on 18.5 inch plinth.  (07/10/21); 20 seconds with B UE on thighs from 18.5 inch plinth (09/04/2021); 19 seconds with B UE on thighs from 18.5 inch plinth (10/04/2021); 22 seconds with B UE on thighs from  18.5 inch plinth (11/21/2021); 20 seconds with B UE on thighs from 18.5 inch plinth (01/01/2022); 19 seconds without use of B UE support (02/15/2022); 21 seconds with no UE support from 18.5 inch plinth (03/27/2022); 20.5 seconds no UE support and 18" surface (05/17/2022);  Goal status: in progress     5.  Patient be able to ascend and descend stairs at her home mod I with use of B handrails.  Baseline: able to ascend/descend 4 steps in the clinic with B UE support on handrails and supervision - reports currently needs assistance to descend steps at home. (07/10/21); able to ascend/descend 4 steps in the clinic with B UE support on handrails and supervision, leading up and down with left. able to perform with reversed LE leads (09/04/2021); able to ascend/descend 4 steps in the clinic with B UE support on handrails and supervision, step over step with heavy UE use on the way down (10/04/2021); able  to ascend/descend 4 steps in the clinic with B UE support on handrails and supervision, step over step up and step to leading with R LE with heavy UE use on the way down (11/21/2021); ascend/descends 4 steps in the clinic with B UE support on handrails and supervision, step over step up and down with heavy UE use on the way down (01/01/2022); able to ascend/descend 4 steps in the clinic with B UE support on handrails and supervision, step over step up and down with heavy UE use on the way down. leads up with right foot step to gait (02/15/2022; 03/27/22);  Goal status: MET   6.  Patient will demonstrate ability to perform static balance in tandem stance for equal or greater than 30 seconds on each side, to demonstrate improved balance and decreased fall risk.  Baseline: right foot front 10 seconds, left foot front 7 seconds (07/10/2021); right foot front 4 seconds, left foot front 6 seconds (09/04/2021); right foot front 9 seconds, left foot front 31 seconds (10/04/2021); right foot front 6.5 seconds, left foot front 8.5  seconds (11/21/2021); right foot front 8 seconds, left foot front 7 seconds (01/01/2022);  right foot front 9 seconds, left foot front 20 seconds (02/15/2022); right foot front 30 seconds, left foot front 45 seconds (03/27/2022);  Goal status: MET 03/27/22     PLAN: PT FREQUENCY: 2x/week   PT DURATION: 12 weeks   PLANNED INTERVENTIONS: Therapeutic exercises, Therapeutic activity, Neuromuscular re-education, Balance training, Gait training, Patient/Family education, Joint mobilization, Orthotic/Fit training, DME instructions, Dry Needling, Electrical stimulation, Cryotherapy, Moist heat, Splintting, Taping, and Manual therapy   PLAN FOR NEXT SESSION: continue with moderate intensity functional strengthening and balance exercises as able, address back and right glute pain to allow return to functional strengthening and balance with better tolerance.    Everlean Alstrom. Graylon Good, PT, DPT 05/22/22, 5:51 PM  [facility] [address] [place], [place] [address] P: [phone] I F: [phone]

## 2022-05-24 ENCOUNTER — Ambulatory Visit (INDEPENDENT_AMBULATORY_CARE_PROVIDER_SITE_OTHER): Payer: 59 | Admitting: Neurology

## 2022-05-24 ENCOUNTER — Ambulatory Visit: Payer: 59 | Admitting: Physical Therapy

## 2022-05-24 ENCOUNTER — Encounter: Payer: Self-pay | Admitting: Physical Therapy

## 2022-05-24 ENCOUNTER — Telehealth: Payer: Self-pay | Admitting: Neurology

## 2022-05-24 VITALS — BP 139/90 | HR 113 | Ht 67.0 in | Wt 249.0 lb

## 2022-05-24 DIAGNOSIS — R2689 Other abnormalities of gait and mobility: Secondary | ICD-10-CM | POA: Diagnosis not present

## 2022-05-24 DIAGNOSIS — R269 Unspecified abnormalities of gait and mobility: Secondary | ICD-10-CM | POA: Diagnosis not present

## 2022-05-24 DIAGNOSIS — R29898 Other symptoms and signs involving the musculoskeletal system: Secondary | ICD-10-CM

## 2022-05-24 DIAGNOSIS — Z9181 History of falling: Secondary | ICD-10-CM

## 2022-05-24 DIAGNOSIS — M6281 Muscle weakness (generalized): Secondary | ICD-10-CM

## 2022-05-24 NOTE — Therapy (Signed)
OUTPATIENT PHYSICAL THERAPY TREATMENT NOTE  Patient Name: [name] MRN: [id number] DOB:[birth year], 47 y.o., female Today's Date: 05/24/2022  PCP: Janine Limbo, PA-C REFERRING PROVIDER: Janine Limbo, PA-C  END OF SESSION:   PT End of Session - 05/24/22 1753     Visit Number 41    Number of Visits 31    Date for PT Re-Evaluation 08/14/22    Authorization Type [employer] MEDICARE/Medicaid reporting period from 05/22/2022    Progress Note Due on Visit 40    PT Start Time 1731    PT Stop Time 1810    PT Time Calculation (min) 39 min    Activity Tolerance Patient limited by pain;Patient limited by fatigue    Behavior During Therapy [facility] for tasks assessed/performed              Past Medical History:  Diagnosis Date   Alcohol abuse    Anxiety    Anxiety    Anxiety and depression    Asthma    Bipolar disorder ([name])    Borderline personality disorder ([place])    Depression    Diabetes ([place])    Type 2    Dyslipidemia    Esophageal varices ([name])    Foot drop    right   [name] ([name])    GERD (gastroesophageal reflux disease)    Gout    History of drug abuse ([place])    Insomnia    Muscular dystrophies ([place])    Neuropathy    Pancreatitis    Recovering alcoholic ([place])    Past Surgical History:  Procedure Laterality Date   ESOPHAGOGASTRODUODENOSCOPY N/A 04/25/2022   Procedure: ESOPHAGOGASTRODUODENOSCOPY (EGD);  Surgeon: Jonathon Bellows, MD;  Location: [facility] ENDOSCOPY;  Service: Gastroenterology;  Laterality: N/A;   HERNIA REPAIR     IR FLUORO GUIDE CV LINE RIGHT  12/30/2020   IR REMOVAL TUN CV CATH W/O FL  01/19/2021   IR US GUIDE VASC ACCESS RIGHT  12/30/2020   Patient Active Problem List   Diagnosis Date Noted   Gait abnormality 05/24/2022   Right hand weakness 05/24/2022   Frequent falls 02/12/2021   Leukocytosis    Aspiration pneumonia of both lower lobes due to gastric secretions ([place]) 01/04/2021   Pressure injury of skin 12/24/2020    Acute hepatic encephalopathy ([place]) 12/20/2020   Sepsis ([place]) 12/19/2020   Acute hyponatremia 12/18/2020   Alcohol use disorder, moderate, dependence ([place]) 12/18/2020   Hepatic steatosis 12/18/2020   Polysubstance abuse ([place]) 12/18/2020   Hyperglycemia due to type 2 diabetes mellitus ([place]) 12/18/2020   Hyperbilirubinemia 12/18/2020   Lactic acidosis [id number]   Alcoholic liver disease ([place]) 12/18/2020   Bipolar 1 disorder, manic, moderate ([place]) 09/10/2018   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   Diabetes ([place]) 09/10/2018   [name] ([place]) 03/28/2018   Chronic gout of foot 09/29/2015   Muscular dystrophy ([place]) 09/29/2015   Type 2 diabetes mellitus without complication ([place]) [id number]   Anxiety and depression 10/01/2012   Protein-calorie malnutrition, severe ([place]) 09/30/2012   Alcohol abuse 09/26/2012   Trichomonas infection 09/26/2012   Dehydration 09/26/2012   Hypokalemia 11/14/2011   Thrombocytopenia ([place]) 11/14/2011   Alcohol withdrawal ([place]) 11/13/2011   Bipolar 2 disorder ([place]) 11/13/2011   Tobacco abuse 11/13/2011   Cocaine abuse ([place]) 11/13/2011   Gout 11/13/2011     REFERRING DIAG: bilateral leg weakness  THERAPY DIAG:  Other abnormalities of gait and mobility  Muscle weakness (generalized)  History of falling  Rationale  for Evaluation and Treatment Rehabilitation  PERTINENT HISTORY: Patient is a 47 y.o. female who presents to outpatient physical therapy with a referral for medical diagnosis bilateral leg weakness. This patient's chief complaints consist of difficulty with weakness and balance with history of frequent falling leading to the following functional deficits: difficulty with basic mobility including ambulation, stairs, transfers; difficulty with ADLs, IADLs. Currently requires assistance to navigate stairs, assistance with ADLs and  IADLs, and ambulates with RW when she previously did not need an assistive device.  Relevant past medical history and comorbidities include muscular dystrophy (FSHD - facioscapulohumeral muscular dystrophy), anxiety and depression, bipolar 1 disorder, borderline personality disorder, diabetes, GERD, gout, pancreatitis, alcoholic liver disease, tobacco abuse,  foot drop, neuropathy, recovering alcoholic, history of drug abuse, history of hernia repair.  Patient denies hx of cancer, stroke, seizures, lung problems, heart problems, unexplained weight loss, unexplained changes in bowel or bladder problems, unexplained stumbling or dropping things, osteoporosis, and spinal surgery  PRECAUTIONS: Fall, not drink alcohol.  SUBJECTIVE: Patient arrives with rollator. She states "you worked the hell out of me last time" but that "I think I need that."  She was pretty sore after last PT session. She states she is having a lot of pain in her usual spot in the right glute and hopes for some manual therapy to address that. She saw her neurologist today who wants to do a nerve conduction test. She denies falls since last PT session although she had one close call. She did not do HEP yesterday.   PAIN: NPRS 7.5/10 in right low back and glute. B hips 4/10   OBJECTIVE  TODAY'S TREATMENT: Therapeutic exercise: to centralize symptoms and improve ROM, strength, muscular endurance, and activity tolerance required for successful completion of functional activities.  - NuStep level 3 using bilateral upper and lower extremities. Seat/handle setting 9/9. For improved extremity mobility, muscular endurance, and activity tolerance; and to induce the analgesic effect of aerobic exercise, stimulate improved joint nutrition, and prepare body structures and systems for following interventions. x 5 minutes. (Increased leg soreness). Average SPM = 68.     Hooklying: Circuit:  - Glute isometric/bridge (minimal lift) isometric hold 5 seconds: 3x6  - Hip flexion + abduction over hurdles alternating: 3x6/LE  - Hip  adduction blue squishy ball squeeze + LAQ: 3x6/LE     - low trunk rotation with knees towards chest holding small blue ball between knees, 3x5 each direction (needs to put down feet between reps).  - curl up, 3x6 with 5 second holds.     Seated: - B UE D2 flexion with 2KG med ball: 2x6.   Manual therapy: to reduce pain and tissue tension, improve range of motion, neuromodulation, in order to promote improved ability to complete functional activities. PRONE with pillow under chest - STM to lumbar paraspinals and right glute region.  - CPA grade I-III at lumbar spine.   Pt required multimodal cuing for proper technique and to acilitate improved neuromuscular control, strength, range of motion, and functional ability resulting in improved performance and form.     PATIENT EDUCATION:  Education details: form/technique with exercise. Person educated: Patient Education method: Explanation, demonstration, verbal cuing, tactile cuing. Education comprehension: verbalized understanding, demonstrated understanding, and needs further education     HOME EXERCISE PROGRAM: Access Code: [email or web address] URL: [email or web address] Date: 02/15/2022 Prepared by: Rosita Kea  Exercises - Sit to Stand Without Arm Support  - 3 x weekly - 3 sets - 5 reps - Seated Toe Raise  -  3 x weekly - 3 sets - 15 reps - 1 second hold - Supine Lower Trunk Rotation  - 3 x weekly - 1 sets - 20 reps - 1-5 seconds hold - Prone Press Up  - 3 x weekly - 2 sets - 5 reps  HOME EXERCISE PROGRAM [id number]  Seated marching with weighted twist -  Repeat 10 Times, Hold 1 Second(s), Complete 3 Sets, Perform 1 Times a Day    ASSESSMENT:   CLINICAL IMPRESSION: Patient arrives reporting increased soreness in the right glute and low back region. She was motivated to continue with most recent exercises. Continued with similar exercises as they are appropriately challenging. Utilized manual to help with pain management.  Patient would benefit from continued management of limiting condition by skilled physical therapist to address remaining impairments and functional limitations to work towards stated goals and return to PLOF or maximal functional independence.    From PT Eval 07/10/2021:  Patient is a 47 y.o. female referred to outpatient physical therapy with a medical diagnosis of bilateral leg weakness who presents with signs and symptoms consistent with generalized weakness, balance deficits, and motor control problems likely related to deconditioning and FSHD muscular dystrophy. Patient presents with significant balance, motor control, posture, postural, gait, pain, muscle performance (strength/power/endurance) and activity tolerance impairments that are limiting ability to complete basic mobility and self care including ambulation, stairs, transfers, ADLs, and IADLs without difficulty. At eval requires assistance to navigate stairs, assistance with ADLs and  IADLs, and ambulates with RW when she previously did not need an assistive device. Patient would likely benefit from bilateral AFOs to address foot drop found at both ankles and improve her steppage gait. Patient will benefit from skilled physical therapy intervention to address current body structure impairments and activity limitations to improve function and work towards goals set in current POC in order to return to prior level of function or maximal functional improvement.   OBJECTIVE IMPAIRMENTS Abnormal gait, decreased activity tolerance, decreased balance, decreased coordination, decreased endurance, decreased mobility, difficulty walking, decreased ROM, decreased strength, impaired perceived functional ability, increased muscle spasms, impaired tone, impaired UE functional use, improper body mechanics, postural dysfunction, and pain.    ACTIVITY LIMITATIONS cleaning, community activity, driving, meal prep, laundry, and shopping.    PERSONAL FACTORS  Behavior pattern, Fitness, Past/current experiences, Time since onset of injury/illness/exacerbation, Transportation, and 3+ comorbidities:    muscular dystrophy (FSHD - facioscapulohumeral muscular dystrophy), anxiety and depression, bipolar 1 disorder, borderline personality disorder, diabetes, GERD, gout, pancreatitis, alcoholic liver disease, tobacco abuse,  foot drop, neuropathy, recovering alcoholic, history of drug abuse, history of hernia repair are also affecting patient's functional outcome.      REHAB POTENTIAL: Good   CLINICAL DECISION MAKING: Evolving/moderate complexity   EVALUATION COMPLEXITY: Moderate     GOALS: Goals reviewed with patient? No   SHORT TERM GOALS: Target date: 07/24/2021   Patient will be independent with initial home exercise program for self-management of symptoms. Baseline: Initial HEP to be provided at visit 2 as appropriate (07/10/21); Goal status: Met     LONG TERM GOALS: Target date: 10/02/2021; updated to 12/27/2021 for all unmet goals on 10/04/2021; updated to 03/26/2022 for all unmet goals on 01/01/2022; updated to 06/19/2022 for all unmet goals on 03/27/2022; updated to 08/14/2022 for all unmet goals on 05/22/2022.    Patient will be independent with a long-term home exercise program for self-management of symptoms.  Baseline: Initial HEP to be provided at visit 2 as appropriate (07/10/21);  participating as tolerated, recently too much pain to participate (09/04/2021); participating regularly (10/04/2021); participating as able (11/21/2021); not participating regularly over last couple of weeks (01/01/2022); no participating regularly since last progress note (02/15/2022); improving participation (03/27/2022); has not last two weeks due to recent fall (05/17/22);  Goal status: Ongoing   2.  Patient will demonstrate improved FOTO score by 10 points from baseline to demonstrate improvement in overall condition and self-reported functional ability.  Baseline: to be  measured visit 2 as appropriate (07/10/21); 47 at visit # 2 (07/17/2021); 50 at visit # 10 (09/04/2021); 53 at visit #15 (10/04/2021); 50 at visit # 20 (11/21/2021); 52 at visit # 26 (01/01/2022); 50 at visit #30 (02/15/2022); 54 at visit # 34 (03/27/22);  53 (05/17/22); Goal status: In-Progress   3.  Patient will ambulate equal or greater than 1000 feet during 6 Min Walk Test using [facility] or less restrictive assistive device to improve community ambulation.  Baseline: To be tested visit 2 as appropriate (07/10/21); 310 feet with RW (07/17/2021); 556 feet with rollator (09/04/2021); 600 feet with rollator (10/04/2021); 300 feet with rollator - had to stop test at 3 min due to intolerable right glute pain (11/21/2021); 420 feet with rollator and steppage gait, R > L. Stopped at 5 min due to feeling of instability in low back to mid thoracic spine (01/01/2022); 573 feet with rollator and steppage gait, R > L. Occasional standing breaks. Low back pain increased to 3.5/10 (02/15/2022);  381 feet with rollator and steppage gait, R > L. Stopped at 4:30 min due to pain in low back and right glute (03/27/2022); 124' in 1 minute 22 sec. Had to discontinue due to R back/hip pain (05/17/2022);  Goal status: In-Progress   4.  Patient will complete 5 Time Sit To Stand test in equal or less than 20 seconds with no UE support from 18.5 inch plinth to demonstrate improved LE functional strength and power for basic mobility and improved fall risk.  Baseline: 24 seconds with B UE on 18.5 inch plinth.  (07/10/21); 20 seconds with B UE on thighs from 18.5 inch plinth (09/04/2021); 19 seconds with B UE on thighs from 18.5 inch plinth (10/04/2021); 22 seconds with B UE on thighs from 18.5 inch plinth (11/21/2021); 20 seconds with B UE on thighs from 18.5 inch plinth (01/01/2022); 19 seconds without use of B UE support (02/15/2022); 21 seconds with no UE support from 18.5 inch plinth (03/27/2022); 20.5 seconds no UE support and 18" surface (05/17/2022);   Goal status: in progress     5.  Patient be able to ascend and descend stairs at her home mod I with use of B handrails.  Baseline: able to ascend/descend 4 steps in the clinic with B UE support on handrails and supervision - reports currently needs assistance to descend steps at home. (07/10/21); able to ascend/descend 4 steps in the clinic with B UE support on handrails and supervision, leading up and down with left. able to perform with reversed LE leads (09/04/2021); able to ascend/descend 4 steps in the clinic with B UE support on handrails and supervision, step over step with heavy UE use on the way down (10/04/2021); able to ascend/descend 4 steps in the clinic with B UE support on handrails and supervision, step over step up and step to leading with R LE with heavy UE use on the way down (11/21/2021); ascend/descends 4 steps in the clinic with B UE support on handrails and supervision, step over step  up and down with heavy UE use on the way down (01/01/2022); able to ascend/descend 4 steps in the clinic with B UE support on handrails and supervision, step over step up and down with heavy UE use on the way down. leads up with right foot step to gait (02/15/2022; 03/27/22);  Goal status: MET   6.  Patient will demonstrate ability to perform static balance in tandem stance for equal or greater than 30 seconds on each side, to demonstrate improved balance and decreased fall risk.  Baseline: right foot front 10 seconds, left foot front 7 seconds (07/10/2021); right foot front 4 seconds, left foot front 6 seconds (09/04/2021); right foot front 9 seconds, left foot front 31 seconds (10/04/2021); right foot front 6.5 seconds, left foot front 8.5 seconds (11/21/2021); right foot front 8 seconds, left foot front 7 seconds (01/01/2022);  right foot front 9 seconds, left foot front 20 seconds (02/15/2022); right foot front 30 seconds, left foot front 45 seconds (03/27/2022);  Goal status: MET 03/27/22     PLAN: PT  FREQUENCY: 2x/week   PT DURATION: 12 weeks   PLANNED INTERVENTIONS: Therapeutic exercises, Therapeutic activity, Neuromuscular re-education, Balance training, Gait training, Patient/Family education, Joint mobilization, Orthotic/Fit training, DME instructions, Dry Needling, Electrical stimulation, Cryotherapy, Moist heat, Splintting, Taping, and Manual therapy   PLAN FOR NEXT SESSION: continue with moderate intensity functional strengthening and balance exercises as able, address back and right glute pain to allow return to functional strengthening and balance with better tolerance.    Everlean Alstrom. Graylon Good, PT, DPT 05/24/22, 6:12 PM  [facility] [address] [place], [place] [address] P: [phone] I F: [phone]

## 2022-05-24 NOTE — Progress Notes (Signed)
Chief Complaint  Patient presents with   New Patient (Initial Visit)    Rm 15 NX Willis/LS 03/2018/Paper /Muscular dystrophy      ASSESSMENT AND PLAN  [name] is a 47 y.o. 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   MEDICAL HISTORY:  [name] is a 47 year old female, seen in request by her primary care PA O'Buch, Alvis Lemmings, for evaluation of muscle weakness, gait abnormality, new onset right hand weakness, she is accompanied by her, Maternal Cousin Louanna Raw at visit on May 24, 2022   I reviewed and summarized the referring note. PMHX. 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  She lives with her cousin, ambulate with a walker for few years now, significant psychiatric history, on polypharmacy treatment, also personally reviewed multiple hospital admission in November 2022,  Alcohol abuse, polysubstance abuse, was admitted for frequent fall, was not able to get up,  suffered aspiration pneumonia, alcoholic cirrhosis, encephalopathic, ascites, required paracentesis,  Since that hospital discharge, she stayed with her cousin Dawn at home, who has helped her rehabilitation, not able to ambulate with walker  Since October 2023, without clear triggers, she noticed difficulty using her right hand, mostly involving right third through fifth fingers, difficult to make a tight grip, dropping [place] fry out of her hands, also some numbness at the lateral 3 fingers, complains of worsening neck pain, urinary incontinence sometimes cannot make it to the bathroom, she has stopped using alcohol, also smoking, prior to that, she can drink 5 bottles of wine in 12 hours, smoked up to 2 packs a day, previous multiple UDS was positive for cocaine, amphetamine, marijuana, benzo,  PHYSICAL EXAM:   Vitals:   05/24/22 1433  BP: (!) 139/90  Pulse: (!) 113  Weight: 249 lb (112.9 kg)  Height: '5\' 7"'$  (1.702 m)     Body mass index is 39 kg/m.  PHYSICAL EXAMNIATION:  Gen: NAD, conversant, well nourised, well groomed                     Cardiovascular: Regular rate rhythm, no peripheral edema, warm, nontender. Eyes: Conjunctivae clear without exudates or hemorrhage Neck: Supple, no carotid bruits. Pulmonary: Clear to auscultation bilaterally   NEUROLOGICAL EXAM:  MENTAL STATUS: Speech/cognition: Awake, alert, oriented to history taking and casual conversation CRANIAL NERVES: CN II: Visual fields are full to confrontation. Pupils are round equal and briskly reactive to light. CN III, IV, VI: extraocular movement are normal. No ptosis. CN V: Facial sensation is intact to light touch CN VII: Face is symmetric with moderate to severe bilateral eye closure, cheek puff weakness, CN VIII:  Hearing is normal to causal conversation. CN IX, X: Phonation is normal. CN XI: Head turning and shoulder shrug are intact  MOTOR:, Mild neck flexion weakness, mild atrophy of left biceps,   atrophy of distal leg muscle  UE Shoulder Abduction Shoulder External Rotation Elbow Flexion Elbow  Extension Wrist Flexion Wrist Extension Grip Finger  Abduction Finger Flexion /Extension  R 5 5 5- 5- '5 5 5 5 '$ 5/5  L 4 4 5- 5- '5 5 5 5 '$ 5/5   LE Hip Flexion Knee flexion Knee extension Ankle Dorsiflexion Eversion Ankle plantar Flexion Inversion  R '5 5 5 1 3 4 4  '$ L '5 5 5 1 3 4 4    '$ REFLEXES: Reflexes are absent at bilateral upper and lower extremity. Plantar responses are flexor.  SENSORY: Length-dependent decreased to light touch, pinprick to ankle level  COORDINATION: There is no trunk or limb dysmetria noted.  GAIT/STANCE: Need push-up to get up from seated position, very unsteady, bilateral foot drop, also complicated by her central obesity  REVIEW OF SYSTEMS:  Full 14 system review of systems performed and notable only for as above All other review of systems were negative.   ALLERGIES: Allergies  Allergen Reactions   Erythromycin Anaphylaxis   Sulfa Antibiotics Anaphylaxis   Tylenol [Acetaminophen] Other (See Comments)    Pancreatitis and liver dysfunction, not supposed to take med   Nsaids Other (See Comments)    Causes GI problems, very bad reaction-per patient.    Gabapentin     Mood changes     HOME MEDICATIONS: Current Outpatient Medications  Medication Sig Dispense Refill   Accu-Chek Softclix Lancets lancets SMARTSIG:1 Topical Daily     albuterol (VENTOLIN HFA) 108 (90 Base) MCG/ACT inhaler Inhale 2 puffs into the lungs every 6 (six) hours as needed for wheezing. 6.7 g 1   amitriptyline (ELAVIL) 25 MG tablet Take 25 mg by mouth at bedtime.     atorvastatin (LIPITOR) 40 MG tablet Take 1 tablet (40 mg total) by mouth daily. 30 tablet 2   dicyclomine (BENTYL) 20 MG tablet Take 20 mg by mouth 3 (three) times daily.     FLUoxetine (PROZAC) 40 MG capsule Take 2 capsules (80 mg total) by mouth daily. 30 capsule 1   fluticasone (FLONASE) 50 MCG/ACT nasal spray  Place 1 spray into both nostrils daily. 9.9 mL 1   hydrOXYzine (ATARAX) 10 MG tablet Take 10 mg by mouth 2 (two) times daily as needed.     insulin lispro (HUMALOG) 100 UNIT/ML injection Inject 0.12 mLs (12 Units total) into the skin 3 (three) times daily with meals. 10 mL 3   lactulose (CHRONULAC) 10 GM/15ML solution Take 30 mLs (20 g total) by mouth 2 (two) times daily as needed for mild constipation. 236 mL 1   LANTUS SOLOSTAR 100 UNIT/ML Solostar Pen Inject 27 mLs into the skin 2 (two) times daily.     lisinopril (ZESTRIL) 10 MG tablet Take 10 mg by mouth daily.     magnesium oxide (MAG-OX) 400 (240 Mg) MG tablet Take 1 tablet (400 mg total) by mouth 2 (two) times daily.     methocarbamol (ROBAXIN) 500 MG tablet Take 500 mg by mouth 3 (three) times daily as needed.     Mirabegron (MYRBETRIQ PO) Take by mouth.     mirtazapine (REMERON) 15 MG tablet Take 15 mg by mouth at bedtime.     Multiple Vitamin (MULTIVITAMIN WITH MINERALS) TABS tablet Take 1 tablet by mouth daily. [place]  tablet 1   OLANZapine (ZYPREXA) 5 MG tablet Take 1 tablet (5 mg total) by mouth at bedtime. 30 tablet 0   OZEMPIC, 0.25 OR 0.5 MG/DOSE, 2 MG/3ML SOPN once a week.     pantoprazole (PROTONIX) 40 MG tablet Take 1 tablet (40 mg total) by mouth daily. 30 tablet 2   phentermine (ADIPEX-P) 37.5 MG tablet Take 37.5 mg by mouth daily.     pregabalin (LYRICA) 150 MG capsule Take 1 capsule (150 mg total) by mouth 3 (three) times daily. 30 capsule 0   promethazine (PHENERGAN) 25 MG tablet Take 25 mg by mouth every 6 (six) hours as needed.     VRAYLAR 3 MG capsule Take 3 mg by mouth daily.     No current facility-administered medications for this visit.    PAST MEDICAL HISTORY: Past Medical History:  Diagnosis Date   Alcohol abuse    Anxiety    Anxiety    Anxiety and depression    Asthma    Bipolar disorder ([place])    Borderline personality disorder ([place])    Depression    Diabetes ([place])    Type 2    Dyslipidemia     Esophageal varices ([name])    Foot drop    right   [name] ([name])    GERD (gastroesophageal reflux disease)    Gout    History of drug abuse ([place])    Insomnia    Muscular dystrophies ([place])    Neuropathy    Pancreatitis    Recovering alcoholic ([place])     PAST SURGICAL HISTORY: Past Surgical History:  Procedure Laterality Date   ESOPHAGOGASTRODUODENOSCOPY N/A 04/25/2022   Procedure: ESOPHAGOGASTRODUODENOSCOPY (EGD);  Surgeon: Jonathon Bellows, MD;  Location[facility] END[facility]COPY;  Service: Gastroenterology;  Laterality: N/A;   HERNIA REPAIR     IR FLUORO GUIDE CV LINE RIGHT  12/30/2020   IR REMOVAL TUN CV CATH W/O FL  01/19/2021   IR US GUIDE VASC ACCESS RIGHT  12/30/2020    FAMILY HISTORY: Family History  Problem Relation Age of Onset   Heart disease Mother    Heart attack Mother    Hyperlipidemia Maternal Grandmother    Diabetes Maternal Grandmother    Hypertension Maternal Grandmother    Hyperlipidemia Maternal Grandfather    Heart disease Maternal Grandfather    Heart attack Maternal Grandfather    Diabetes Maternal Grandfather    Lung cancer Maternal Grandfather    Hypertension Maternal Grandfather    Other Paternal Grandmother        Thyroid problems    Heart disease Paternal Grandfather    Stroke Paternal Grandfather    Hyperlipidemia Paternal Grandfather    Heart attack Paternal Grandfather    Hypertension Paternal Grandfather     SOCIAL HISTORY: Social History   Socioeconomic History   Marital status: Divorced    Spouse name: Not on file   Number of children: Not on file   Years of education: Not on file   Highest education level: Not on file  Occupational History   Occupation: disabled  Tobacco Use   Smoking status: Former    Packs/day: 0.50    Types: Cigarettes    Quit date: 09/2020    Years since quitting: 1.6   Smokeless tobacco: Never  Substance and Sexual Activity   Alcohol use: Not Currently   Drug use: Yes     Types: IV, Cocaine   Sexual activity: Yes    Birth control/protection: None  Other Topics  Concern   Not on file  Social History Narrative   Not on file   Social Determinants of Health   Financial Resource Strain: Not on file  Food Insecurity: Not on file  Transportation Needs: Not on file  Physical Activity: Not on file  Stress: Not on file  Social Connections: Not on file  Intimate Partner Violence: Not on file      Marcial Pacas, M.D. Ph.D.  [facility] [address] [place], [place] [address] Ph: [phone] Fax: [phone]  CC:  Janine Limbo, PA-C [place],  [place] [id number]  Janine Limbo, PA-C

## 2022-05-24 NOTE — Telephone Encounter (Signed)
[employer] medicare/[place] medicaid NPR sent to [facility] [id number]

## 2022-05-29 ENCOUNTER — Ambulatory Visit: Payer: 59

## 2022-05-29 DIAGNOSIS — R2689 Other abnormalities of gait and mobility: Secondary | ICD-10-CM

## 2022-05-29 DIAGNOSIS — Z9181 History of falling: Secondary | ICD-10-CM

## 2022-05-29 DIAGNOSIS — M6281 Muscle weakness (generalized): Secondary | ICD-10-CM

## 2022-05-29 NOTE — Therapy (Signed)
OUTPATIENT PHYSICAL THERAPY TREATMENT NOTE  Patient Name: [name] MRN: [id number] DOB:1976-03-18, 47 y.o., female Today's Date: 05/29/2022  PCP: Janine Limbo, PA-C REFERRING PROVIDER: Janine Limbo, PA-C  END OF SESSION:   PT End of Session - 05/29/22 1623     Visit Number 42    Number of Visits 40    Date for PT Re-Evaluation 08/14/22    Authorization Type [employer] MEDICARE/Medicaid reporting period from 05/22/2022    Authorization Time Period 05/22/22-08/14/22    Progress Note Due on Visit 69    PT Start Time 1615    PT Stop Time 1655    PT Time Calculation (min) 40 min    Equipment Utilized During Treatment Gait belt    Activity Tolerance Patient tolerated treatment well;No increased pain    Behavior During Therapy WFL for tasks assessed/performed              Past Medical History:  Diagnosis Date   Alcohol abuse    Anxiety    Anxiety    Anxiety and depression    Asthma    Bipolar disorder ([name])    Borderline personality disorder ([place])    Depression    Diabetes ([place])    Type 2    Dyslipidemia    Esophageal varices ([name])    Foot drop    right   [name] ([name])    GERD (gastroesophageal reflux disease)    Gout    History of drug abuse ([place])    Insomnia    Muscular dystrophies ([place])    Neuropathy    Pancreatitis    Recovering alcoholic ([place])    Past Surgical History:  Procedure Laterality Date   ESOPHAGOGASTRODUODENOSCOPY N/A 04/25/2022   Procedure: ESOPHAGOGASTRODUODENOSCOPY (EGD);  Surgeon: Jonathon Bellows, MD;  Location: [facility] ENDOSCOPY;  Service: Gastroenterology;  Laterality: N/A;   HERNIA REPAIR     IR FLUORO GUIDE CV LINE RIGHT  12/30/2020   IR REMOVAL TUN CV CATH W/O FL  01/19/2021   IR US GUIDE VASC ACCESS RIGHT  12/30/2020   Patient Active Problem List   Diagnosis Date Noted   Gait abnormality 05/24/2022   Right hand weakness 05/24/2022   Frequent falls 02/12/2021   Leukocytosis    Aspiration pneumonia of both  lower lobes due to gastric secretions ([place]) 01/04/2021   Pressure injury of skin 12/24/2020   Acute hepatic encephalopathy ([place]) 12/20/2020   Sepsis ([place]) 12/19/2020   Acute hyponatremia 12/18/2020   Alcohol use disorder, moderate, dependence ([place]) 12/18/2020   Hepatic steatosis 12/18/2020   Polysubstance abuse ([place]) 12/18/2020   Hyperglycemia due to type 2 diabetes mellitus ([place]) 12/18/2020   Hyperbilirubinemia 12/18/2020   Lactic acidosis [id number]   Alcoholic liver disease ([place]) 12/18/2020   Bipolar 1 disorder, manic, moderate ([place]) 09/10/2018   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   Diabetes ([place]) 09/10/2018   [name] ([place]) 03/28/2018   Chronic gout of foot 09/29/2015   Muscular dystrophy ([place]) 09/29/2015   Type 2 diabetes mellitus without complication ([place]) [id number]   Anxiety and depression 10/01/2012   Protein-calorie malnutrition, severe ([place]) 09/30/2012   Alcohol abuse 09/26/2012   Trichomonas infection 09/26/2012   Dehydration 09/26/2012   Hypokalemia 11/14/2011   Thrombocytopenia ([place]) 11/14/2011   Alcohol withdrawal ([place]) 11/13/2011   Bipolar 2 disorder ([place]) 11/13/2011   Tobacco abuse 11/13/2011   Cocaine abuse ([place]) 11/13/2011   Gout 11/13/2011     REFERRING DIAG: bilateral leg weakness  THERAPY DIAG:  Other  abnormalities of gait and mobility  Muscle weakness (generalized)  History of falling  Rationale for Evaluation and Treatment Rehabilitation  PERTINENT HISTORY: Patient is a 47 y.o. female who presents to outpatient physical therapy with a referral for medical diagnosis bilateral leg weakness. This patient's chief complaints consist of difficulty with weakness and balance with history of frequent falling leading to the following functional deficits: difficulty with basic mobility including ambulation, stairs, transfers; difficulty with ADLs, IADLs. Currently requires assistance to navigate stairs, assistance with  ADLs and  IADLs, and ambulates with RW when she previously did not need an assistive device. Relevant past medical history and comorbidities include muscular dystrophy (FSHD - facioscapulohumeral muscular dystrophy), anxiety and depression, bipolar 1 disorder, borderline personality disorder, diabetes, GERD, gout, pancreatitis, alcoholic liver disease, tobacco abuse,  foot drop, neuropathy, recovering alcoholic, history of drug abuse, history of hernia repair.  Patient denies hx of cancer, stroke, seizures, lung problems, heart problems, unexplained weight loss, unexplained changes in bowel or bladder problems, unexplained stumbling or dropping things, osteoporosis, and spinal surgery  PRECAUTIONS: Fall, not drink alcohol.  SUBJECTIVE: Pt reports a fall at home, new flooring in kitchen causing her to catch her toes and lose balance. No injury sustained, just sore all over. Pt has no other updates at this time.   PAIN: 6/10 (not too bad)   OBJECTIVE  TODAY'S TREATMENT: -Nustep seat 9, arms 9, level 1x5 minutes -15f AMB overground in clinic (plan was for 244fbut pt limited by DOE)  *seated recovery -10076fMB overground in clinic *vitals show HR 128bpm NSR, SpO2 95% on room air.  -Hooklying myofascial release to Rt glute max x10 minutes  PATIENT EDUCATION:  Pt educated on what appropriate HR is with activity level and exertion per her age, and what symptoms may be experienced with tachycardia and excessively high HR during actvity.      HOME EXERCISE PROGRAM: Access Code: [email or web address]: [email or web address] Date: 02/15/2022 Prepared by: SarRosita Keaxercises - Sit to Stand Without Arm Support  - 3 x weekly - 3 sets - 5 reps - Seated Toe Raise  - 3 x weekly - 3 sets - 15 reps - 1 second hold - Supine Lower Trunk Rotation  - 3 x weekly - 1 sets - 20 reps - 1-5 seconds hold - Prone Press Up  - 3 x weekly - 2 sets - 5 reps  HOME EXERCISE PROGRAM [[id number]  marching with weighted twist -  Repeat 10 Times, Hold 1 Second(s), Complete 3 Sets, Perform 1 Times a Day    ASSESSMENT:   CLINICAL IMPRESSION: Continued with current POC as previously established, bringing in additional walking/transfers training. Pt requires additional recovery intervals today, AMB limited to ~100f78foted HR 128 after each interval. Pt unable to correlate any specific detail on baseline vitals, but today appears that tachycardia is contributing to DOE. Avoided additional exertion from this point one. On review, last few office visits wth neurology and GI show vtials with rates in 110s, but not clear remarks from providers. May need to reach out to PCP regarding phenomenon. Finished session with myofascial work to address tightness in Rt gluteal area.    OBJECTIVE IMPAIRMENTS Abnormal gait, decreased activity tolerance, decreased balance, decreased coordination, decreased endurance, decreased mobility, difficulty walking, decreased ROM, decreased strength, impaired perceived functional ability, increased muscle spasms, impaired tone, impaired UE functional use, improper body mechanics, postural dysfunction, and pain.    ACTIVITY LIMITATIONS cleaning, community activity, driving, meal prep,  laundry, and shopping.    PERSONAL FACTORS Behavior pattern, Fitness, Past/current experiences, Time since onset of injury/illness/exacerbation, Transportation, and 3+ comorbidities:    muscular dystrophy (FSHD - facioscapulohumeral muscular dystrophy), anxiety and depression, bipolar 1 disorder, borderline personality disorder, diabetes, GERD, gout, pancreatitis, alcoholic liver disease, tobacco abuse,  foot drop, neuropathy, recovering alcoholic, history of drug abuse, history of hernia repair are also affecting patient's functional outcome.      REHAB POTENTIAL: Good   CLINICAL DECISION MAKING: Evolving/moderate complexity   EVALUATION COMPLEXITY: Moderate     GOALS: Goals reviewed with  patient? No   SHORT TERM GOALS: Target date: 07/24/2021   Patient will be independent with initial home exercise program for self-management of symptoms. Baseline: Initial HEP to be provided at visit 2 as appropriate (07/10/21); Goal status: Met     LONG TERM GOALS: Target date: 10/02/2021; updated to 12/27/2021 for all unmet goals on 10/04/2021; updated to 03/26/2022 for all unmet goals on 01/01/2022; updated to 06/19/2022 for all unmet goals on 03/27/2022; updated to 08/14/2022 for all unmet goals on 05/22/2022.    Patient will be independent with a long-term home exercise program for self-management of symptoms.  Baseline: Initial HEP to be provided at visit 2 as appropriate (07/10/21); participating as tolerated, recently too much pain to participate (09/04/2021); participating regularly (10/04/2021); participating as able (11/21/2021); not participating regularly over last couple of weeks (01/01/2022); no participating regularly since last progress note (02/15/2022); improving participation (03/27/2022); has not last two weeks due to recent fall (05/17/22);  Goal status: Ongoing   2.  Patient will demonstrate improved FOTO score by 10 points from baseline to demonstrate improvement in overall condition and self-reported functional ability.  Baseline: to be measured visit 2 as appropriate (07/10/21); 47 at visit # 2 (07/17/2021); 50 at visit # 10 (09/04/2021); 53 at visit #15 (10/04/2021); 50 at visit # 20 (11/21/2021); 52 at visit # 26 (01/01/2022); 50 at visit #30 (02/15/2022); 54 at visit # 34 (03/27/22);  53 (05/17/22); Goal status: In-Progress   3.  Patient will ambulate equal or greater than 1000 feet during 6 Min Walk Test using [facility] or less restrictive assistive device to improve community ambulation.  Baseline: To be tested visit 2 as appropriate (07/10/21); 310 feet with RW (07/17/2021); 556 feet with rollator (09/04/2021); 600 feet with rollator (10/04/2021); 300 feet with rollator - had to stop test at 3 min due  to intolerable right glute pain (11/21/2021); 420 feet with rollator and steppage gait, R > L. Stopped at 5 min due to feeling of instability in low back to mid thoracic spine (01/01/2022); 573 feet with rollator and steppage gait, R > L. Occasional standing breaks. Low back pain increased to 3.5/10 (02/15/2022);  381 feet with rollator and steppage gait, R > L. Stopped at 4:30 min due to pain in low back and right glute (03/27/2022); 124' in 1 minute 22 sec. Had to discontinue due to R back/hip pain (05/17/2022);  Goal status: In-Progress   4.  Patient will complete 5 Time Sit To Stand test in equal or less than 20 seconds with no UE support from 18.5 inch plinth to demonstrate improved LE functional strength and power for basic mobility and improved fall risk.  Baseline: 24 seconds with B UE on 18.5 inch plinth.  (07/10/21); 20 seconds with B UE on thighs from 18.5 inch plinth (09/04/2021); 19 seconds with B UE on thighs from 18.5 inch plinth (10/04/2021); 22 seconds with B UE on thighs from  18.5 inch plinth (11/21/2021); 20 seconds with B UE on thighs from 18.5 inch plinth (01/01/2022); 19 seconds without use of B UE support (02/15/2022); 21 seconds with no UE support from 18.5 inch plinth (03/27/2022); 20.5 seconds no UE support and 18" surface (05/17/2022);  Goal status: in progress     5.  Patient be able to ascend and descend stairs at her home mod I with use of B handrails.  Baseline: able to ascend/descend 4 steps in the clinic with B UE support on handrails and supervision - reports currently needs assistance to descend steps at home. (07/10/21); able to ascend/descend 4 steps in the clinic with B UE support on handrails and supervision, leading up and down with left. able to perform with reversed LE leads (09/04/2021); able to ascend/descend 4 steps in the clinic with B UE support on handrails and supervision, step over step with heavy UE use on the way down (10/04/2021); able to ascend/descend 4 steps in the  clinic with B UE support on handrails and supervision, step over step up and step to leading with R LE with heavy UE use on the way down (11/21/2021); ascend/descends 4 steps in the clinic with B UE support on handrails and supervision, step over step up and down with heavy UE use on the way down (01/01/2022); able to ascend/descend 4 steps in the clinic with B UE support on handrails and supervision, step over step up and down with heavy UE use on the way down. leads up with right foot step to gait (02/15/2022; 03/27/22);  Goal status: MET   6.  Patient will demonstrate ability to perform static balance in tandem stance for equal or greater than 30 seconds on each side, to demonstrate improved balance and decreased fall risk.  Baseline: right foot front 10 seconds, left foot front 7 seconds (07/10/2021); right foot front 4 seconds, left foot front 6 seconds (09/04/2021); right foot front 9 seconds, left foot front 31 seconds (10/04/2021); right foot front 6.5 seconds, left foot front 8.5 seconds (11/21/2021); right foot front 8 seconds, left foot front 7 seconds (01/01/2022);  right foot front 9 seconds, left foot front 20 seconds (02/15/2022); right foot front 30 seconds, left foot front 45 seconds (03/27/2022);  Goal status: MET 03/27/22     PLAN: PT FREQUENCY: 2x/week   PT DURATION: 12 weeks   PLANNED INTERVENTIONS: Therapeutic exercises, Therapeutic activity, Neuromuscular re-education, Balance training, Gait training, Patient/Family education, Joint mobilization, Orthotic/Fit training, DME instructions, Dry Needling, Electrical stimulation, Cryotherapy, Moist heat, Splintting, Taping, and Manual therapy   PLAN FOR NEXT SESSION: continue with moderate intensity functional strengthening and balance exercises as able, address back and right glute pain to allow return to functional strengthening and balance with better tolerance.    4:27 PM, 05/29/22 Etta Grandchild, PT, DPT Physical Therapist - [place] [phone] (Office)   05/29/22, 4:27 PM  [facility] [address] [place], [place] [address] P: [phone] I F: [phone]

## 2022-05-31 ENCOUNTER — Ambulatory Visit: Payer: 59 | Admitting: Physical Therapy

## 2022-05-31 NOTE — Telephone Encounter (Signed)
Patient called me and left me a voicemail letting me know that she wanted to reschedule her procedure. I then call her right back and she does not answer. I left her a voicemail to call me back.

## 2022-06-01 NOTE — Telephone Encounter (Signed)
Called and left a message for call back  

## 2022-06-05 ENCOUNTER — Ambulatory Visit: Payer: 59 | Admitting: Physical Therapy

## 2022-06-07 ENCOUNTER — Ambulatory Visit: Payer: 59 | Admitting: Physical Therapy

## 2022-06-08 NOTE — Telephone Encounter (Signed)
Called patient again and had to leave her another voicemail to call us back.

## 2022-06-11 ENCOUNTER — Ambulatory Visit: Payer: 59 | Admitting: Gastroenterology

## 2022-06-12 ENCOUNTER — Ambulatory Visit: Payer: 59 | Admitting: Physical Therapy

## 2022-06-14 ENCOUNTER — Ambulatory Visit: Payer: 59 | Admitting: Physical Therapy

## 2022-06-14 ENCOUNTER — Encounter: Payer: Self-pay | Admitting: Physical Therapy

## 2022-06-14 VITALS — BP 100/71 | HR 110

## 2022-06-14 DIAGNOSIS — M6281 Muscle weakness (generalized): Secondary | ICD-10-CM

## 2022-06-14 DIAGNOSIS — R2689 Other abnormalities of gait and mobility: Secondary | ICD-10-CM | POA: Diagnosis not present

## 2022-06-14 DIAGNOSIS — Z9181 History of falling: Secondary | ICD-10-CM
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
## 2022-06-17 ENCOUNTER — Ambulatory Visit
Admission: RE | Admit: 2022-06-17 | Discharge: 2022-06-17 | Disposition: A | Discharge status: Home / Self Care | Payer: 59 | Source: Ambulatory Visit | Attending: Neurology | Admitting: Neurology

## 2022-06-17 DIAGNOSIS — R29898 Other symptoms and signs involving the musculoskeletal system: Secondary | ICD-10-CM

## 2022-06-17 DIAGNOSIS — R269 Unspecified abnormalities of gait and mobility: Secondary | ICD-10-CM

## 2022-06-18 NOTE — Telephone Encounter (Signed)
Patient called and left a voicemail for me to call her back. However, I called her and had to leave her another voicemail to call us back.

## 2022-06-19 ENCOUNTER — Other Ambulatory Visit: Payer: Self-pay

## 2022-06-19 ENCOUNTER — Ambulatory Visit: Payer: 59 | Admitting: Physical Therapy

## 2022-06-19 DIAGNOSIS — R1319 Other dysphagia: Secondary | ICD-10-CM

## 2022-06-19 NOTE — Telephone Encounter (Signed)
Called patient back and I was finally able to speak to her. Patient stated that she would be able to do her EGD on 06/26/2022 to make sure that everything "recreational drugs" out of her system. I explained to her that she is to hold any multivitamins 5 days prior, Ozempic 7 days before and to do half dosage of her insulin the night before her procedure. Instructions will be sent through MyChart and patient is aware. Patient understood and had no further questions.

## 2022-06-21 ENCOUNTER — Ambulatory Visit: Payer: 59 | Admitting: Gastroenterology

## 2022-06-21 ENCOUNTER — Telehealth: Payer: Self-pay | Admitting: Gastroenterology

## 2022-06-21 ENCOUNTER — Ambulatory Visit: Payer: 59 | Admitting: Physical Therapy

## 2022-06-21 NOTE — Telephone Encounter (Signed)
Patient called to cancel upcoming procedure. States that she will call back when she gets back in town to reschedule.

## 2022-06-21 NOTE — Telephone Encounter (Signed)
Called endoscopy unit to cancel patient's EGD and spoke to [place]. Dr. Vicente Males, I want you to know that she stated that she smoked marijuana and is also going out of town so she will call us back when she is ready to reschedule.

## 2022-06-26 ENCOUNTER — Encounter: Admission: RE | Payer: Self-pay | Source: Home / Self Care

## 2022-06-26 ENCOUNTER — Ambulatory Visit: Payer: 59 | Admitting: Physical Therapy

## 2022-06-26 ENCOUNTER — Ambulatory Visit: Admission: RE | Admit: 2022-06-26 | Payer: 59 | Source: Home / Self Care | Admitting: Gastroenterology

## 2022-06-26 SURGERY — ESOPHAGOGASTRODUODENOSCOPY (EGD) WITH PROPOFOL
Anesthesia: General

## 2022-06-28 ENCOUNTER — Encounter: Payer: 59 | Admitting: Physical Therapy

## 2022-07-03 ENCOUNTER — Ambulatory Visit: Payer: 59 | Admitting: Physical Therapy

## 2022-07-05 ENCOUNTER — Ambulatory Visit: Payer: 59 | Admitting: Physical Therapy

## 2022-07-10 ENCOUNTER — Ambulatory Visit: Payer: 59 | Admitting: Physical Therapy

## 2022-07-11 ENCOUNTER — Encounter: Payer: 59 | Admitting: Neurology

## 2022-07-12 ENCOUNTER — Ambulatory Visit: Payer: 59 | Admitting: Physical Therapy

## 2022-07-12 ENCOUNTER — Telehealth: Payer: Self-pay | Admitting: Gastroenterology

## 2022-07-12 NOTE — Telephone Encounter (Signed)
Dr. Tobi Bastos, patient cancelled her procedure again (04/25/22 and 06/26/2022). Are you okay me rescheduling it? Please advise.

## 2022-07-12 NOTE — Telephone Encounter (Signed)
Pt left message to reschedule EGD please return call

## 2022-07-13 NOTE — Telephone Encounter (Signed)
Ok sounds good

## 2022-07-16 NOTE — Telephone Encounter (Signed)
Called patient back and left her a voicemail since the call went straight to voicemail.

## 2022-07-18 ENCOUNTER — Ambulatory Visit: Payer: 59 | Admitting: Physical Therapy

## 2022-07-26 NOTE — Telephone Encounter (Signed)
Called patient again and left her a voicemail to call me back. 

## 2022-08-08 ENCOUNTER — Ambulatory Visit: Payer: 59 | Admitting: Physical Therapy

## 2022-08-20 ENCOUNTER — Encounter: Payer: Self-pay | Admitting: Physical Therapy

## 2022-08-20 ENCOUNTER — Ambulatory Visit: Payer: 59 | Attending: Internal Medicine | Admitting: Physical Therapy

## 2022-08-20 DIAGNOSIS — M6281 Muscle weakness (generalized): Secondary | ICD-10-CM | POA: Diagnosis not present

## 2022-08-20 DIAGNOSIS — Z9181 History of falling: Secondary | ICD-10-CM | POA: Insufficient documentation

## 2022-08-20 DIAGNOSIS — R2689 Other abnormalities of gait and mobility: Secondary | ICD-10-CM | POA: Insufficient documentation
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
## 2022-08-28 ENCOUNTER — Encounter: Payer: Self-pay | Admitting: Physical Therapy

## 2022-08-28 ENCOUNTER — Ambulatory Visit: Payer: 59 | Admitting: Physical Therapy

## 2022-08-28 DIAGNOSIS — Z9181 History of falling: Secondary | ICD-10-CM

## 2022-08-28 DIAGNOSIS — R2689 Other abnormalities of gait and mobility: Secondary | ICD-10-CM

## 2022-08-28 DIAGNOSIS — M6281 Muscle weakness (generalized): Secondary | ICD-10-CM | POA: Diagnosis not present

## 2022-08-28 NOTE — Therapy (Signed)
OUTPATIENT PHYSICAL THERAPY TREATMENT NOTE  Patient Name: [name] MRN: [id number] DOB:08-19-1975, 47 y.o., female Today's Date: 08/28/2022  PCP: Eunice Blase, PA-C REFERRING PROVIDER: Eunice Blase, PA-C  END OF SESSION:   PT End of Session - 08/28/22 1723     Visit Number 45    Number of Visits 63    Date for PT Re-Evaluation 11/12/22    Authorization Type [employer] MEDICARE/Medicaid reporting period from 05/22/2022    Progress Note Due on Visit 50    PT Start Time 1653    PT Stop Time 1731    PT Time Calculation (min) 38 min    Equipment Utilized During Treatment Gait belt    Behavior During Therapy WFL for tasks assessed/performed               Past Medical History:  Diagnosis Date   Alcohol abuse    Anxiety    Anxiety    Anxiety and depression    Asthma    Bipolar disorder ([name])    Borderline personality disorder ([name])    Depression    Diabetes ([name])    Type 2    Dyslipidemia    Esophageal varices ([name])    Foot drop    right   [name] ([name])    GERD (gastroesophageal reflux disease)    Gout    History of drug abuse ([name])    Insomnia    Muscular dystrophies ([name])    Neuropathy    Pancreatitis    Recovering alcoholic ([name])    Past Surgical History:  Procedure Laterality Date   ESOPHAGOGASTRODUODENOSCOPY N/A 04/25/2022   Procedure: ESOPHAGOGASTRODUODENOSCOPY (EGD);  Surgeon: [name], MD;  Location: [facility] ENDOSCOPY;  Service: Gastroenterology;  Laterality: N/A;   HERNIA REPAIR     IR FLUORO GUIDE CV LINE RIGHT  12/30/2020   IR REMOVAL TUN CV CATH W/O FL  01/19/2021   IR US GUIDE VASC ACCESS RIGHT  12/30/2020   Patient Active Problem List   Diagnosis Date Noted   Gait abnormality 05/24/2022   Right hand weakness 05/24/2022   Frequent falls 02/12/2021   Leukocytosis    Aspiration pneumonia of both lower lobes due to gastric secretions ([name]) 01/04/2021   Pressure injury of skin 12/24/2020   Acute hepatic  encephalopathy ([name]) 12/20/2020   Sepsis ([name]) 12/19/2020   Acute hyponatremia 12/18/2020   Alcohol use disorder, moderate, dependence ([name]) 12/18/2020   Hepatic steatosis 12/18/2020   Polysubstance abuse ([name]) 12/18/2020   Hyperglycemia due to type 2 diabetes mellitus ([name]) 12/18/2020   Hyperbilirubinemia 12/18/2020   Lactic acidosis 12/18/2020   Alcoholic liver disease ([name]) 12/18/2020   Bipolar 1 disorder, manic, moderate ([name]) 09/10/2018   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   Diabetes ([name]) 09/10/2018   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   Chronic gout of foot 09/29/2015   Muscular dystrophy ([name]) 09/29/2015   Type 2 diabetes mellitus without complication ([name]) 09/29/2015   Anxiety and depression 10/01/2012   Protein-calorie malnutrition, severe ([name]) 09/30/2012   Alcohol abuse 09/26/2012   Trichomonas infection 09/26/2012   Dehydration 09/26/2012   Hypokalemia 11/14/2011   Thrombocytopenia ([name]) 11/14/2011   Alcohol withdrawal ([name]) 11/13/2011   Bipolar 2 disorder ([name]) 11/13/2011   Tobacco abuse 11/13/2011   Cocaine abuse ([name]) 11/13/2011   Gout 11/13/2011     REFERRING DIAG: bilateral leg weakness  THERAPY DIAG:  Other abnormalities of gait and mobility  Muscle weakness (generalized)  History of falling  Rationale for Evaluation  and Treatment Rehabilitation  PERTINENT HISTORY: Patient is a 47 y.o. female who presents to outpatient physical therapy with a referral for medical diagnosis bilateral leg weakness. This patient's chief complaints consist of difficulty with weakness and balance with history of frequent falling leading to the following functional deficits: difficulty with basic mobility including ambulation, stairs, transfers; difficulty with ADLs, IADLs. Currently requires assistance to navigate stairs, assistance with ADLs and  IADLs, and ambulates with RW when she previously did not need an assistive device. Relevant past  medical history and comorbidities include muscular dystrophy (FSHD - facioscapulohumeral muscular dystrophy), anxiety and depression, bipolar 1 disorder, borderline personality disorder, diabetes, GERD, gout, pancreatitis, alcoholic liver disease, tobacco abuse,  foot drop, neuropathy, recovering alcoholic, history of drug abuse, history of hernia repair.  Patient denies hx of cancer, stroke, seizures, lung problems, heart problems, unexplained weight loss, unexplained changes in bowel or bladder problems, unexplained stumbling or dropping things, osteoporosis, and spinal surgery  PRECAUTIONS: Fall, not drink alcohol.  SUBJECTIVE: Patient reports she is doing well. She did her HEP on Saturday. She was sore after last PT session, and she thinks it because of doing exercise she is not used to. She has pain in her right glute and low back upon arrival. She is using her rollator.   PAIN: NPRS 4/10 right lower back  OBJECTIVE  TODAY'S TREATMENT: Therapeutic exercise: to centralize symptoms and improve ROM, strength, muscular endurance, and activity tolerance required for successful completion of functional activities.  - ambulation over ground with rollator, 200 feet. (Limited by pain in right glute/lower back).  - sit <> stand 3x5 from 19.5 inch table.  - seated toe raises (DF), 3x6. - seated LAQ, 3x5-6 each side with 5#AW.  - standing hip abduction, 3x5 each direction.  - prone press up, 3x5 - hooklying glute bridge, 3x5 - hooklying hip abduction, 3x5 with GTB  Pt required multimodal cuing for proper technique and to facilitate improved neuromuscular control, strength, range of motion, and functional ability resulting in improved performance and form.   PATIENT EDUCATION:  Education details: form/technique with exercise. Recommendation to contact PCP about tachycardia, why tachycardia is not desirable.  Person educated: Patient Education method: Explanation, demonstration, verbal cuing,  tactile cuing. Education comprehension: verbalized understanding, demonstrated understanding, and needs further education      HOME EXERCISE PROGRAM: Access Code: [email or web address] URL: [email or web address] Date: 08/20/2022 Prepared by: [place] Blizzard  Exercises - Sit to Stand Without Arm Support  - 3 x weekly - 3 sets - 5 reps - Seated Toe Raise  - 3 x weekly - 3 sets - 6 reps - 1 second hold - Supine Lower Trunk Rotation  - 3 x weekly - 1 sets - 20 reps - 1-5 seconds hold - Prone Press Up  - 3 x weekly - 2 sets - 5 reps    ASSESSMENT:   CLINICAL IMPRESSION: Patient arrives with report of continued right glute/back pain but was able to continue with strengthening exercises to improve balance and function. Low reps per set and frequent breaks utilized due to muscular dystrophy diagnosis. Patient appeared to tolerated session well and reported being appropriately tired at the end of the session. Patient would benefit from continued management of limiting condition by skilled physical therapist to address remaining impairments and functional limitations to work towards stated goals and return to PLOF or maximal functional independence.   OBJECTIVE IMPAIRMENTS Abnormal gait, decreased activity tolerance, decreased balance, decreased coordination, decreased endurance, decreased mobility, difficulty walking, decreased ROM,  decreased strength, impaired perceived functional ability, increased muscle spasms, impaired tone, impaired UE functional use, improper body mechanics, postural dysfunction, and pain.    ACTIVITY LIMITATIONS cleaning, community activity, driving, meal prep, laundry, and shopping.    PERSONAL FACTORS Behavior pattern, Fitness, Past/current experiences, Time since onset of injury/illness/exacerbation, Transportation, and 3+ comorbidities:    muscular dystrophy (FSHD - facioscapulohumeral muscular dystrophy), anxiety and depression, bipolar 1 disorder, borderline personality  disorder, diabetes, GERD, gout, pancreatitis, alcoholic liver disease, tobacco abuse,  foot drop, neuropathy, recovering alcoholic, history of drug abuse, history of hernia repair are also affecting patient's functional outcome.      REHAB POTENTIAL: Good   CLINICAL DECISION MAKING: Evolving/moderate complexity   EVALUATION COMPLEXITY: Moderate     GOALS: Goals reviewed with patient? No   SHORT TERM GOALS: Target date: 07/24/2021   Patient will be independent with initial home exercise program for self-management of symptoms. Baseline: Initial HEP to be provided at visit 2 as appropriate (07/10/21); Goal status: Met     LONG TERM GOALS: Target date: 10/02/2021; updated to 12/27/2021 for all unmet goals on 10/04/2021; updated to 03/26/2022 for all unmet goals on 01/01/2022; updated to 06/19/2022 for all unmet goals on 03/27/2022; updated to 08/14/2022 for all unmet goals on 05/22/2022, updated to 11/12/2022 for all unmet goals on 08/20/2022.    Patient will be independent with a long-term home exercise program for self-management of symptoms.  Baseline: Initial HEP to be provided at visit 2 as appropriate (07/10/21); participating as tolerated, recently too much pain to participate (09/04/2021); participating regularly (10/04/2021); participating as able (11/21/2021); not participating regularly over last couple of weeks (01/01/2022); no participating regularly since last progress note (02/15/2022); improving participation (03/27/2022); has not last two weeks due to recent fall (05/17/22); has not been participating (08/20/2022);  Goal status: Ongoing   2.  Patient will demonstrate improved FOTO score by 10 points from baseline to demonstrate improvement in overall condition and self-reported functional ability.  Baseline: to be measured visit 2 as appropriate (07/10/21); 46 at visit # 2 (07/17/2021); 50 at visit # 10 (09/04/2021); 53 at visit #15 (10/04/2021); 50 at visit # 20 (11/21/2021); 52 at visit # 26  (01/01/2022); 50 at visit #30 (02/15/2022); 54 at visit # 34 (03/27/22);  53 (05/17/22); 51 at visit #44 (08/20/2022); Goal status: In-Progress   3.  Patient will ambulate equal or greater than 1000 feet during 6 Min Walk Test using [facility] or less restrictive assistive device to improve community ambulation.  Baseline: To be tested visit 2 as appropriate (07/10/21); 310 feet with RW (07/17/2021); 556 feet with rollator (09/04/2021); 600 feet with rollator (10/04/2021); 300 feet with rollator - had to stop test at 3 min due to intolerable right glute pain (11/21/2021); 420 feet with rollator and steppage gait, R > L. Stopped at 5 min due to feeling of instability in low back to mid thoracic spine (01/01/2022); 573 feet with rollator and steppage gait, R > L. Occasional standing breaks. Low back pain increased to 3.5/10 (02/15/2022);  381 feet with rollator and steppage gait, R > L. Stopped at 4:30 min due to pain in low back and right glute (03/27/2022); 124' in 1 minute 22 sec. Had to discontinue due to R back/hip pain (05/17/2022); 200 feet with rollator, discontinued due to low back/glute pain (08/20/2022);  Goal status: In-Progress   4.  Patient will complete 5 Time Sit To Stand test in equal or less than 20 seconds with no UE support from  18.5 inch plinth to demonstrate improved LE functional strength and power for basic mobility and improved fall risk.  Baseline: 24 seconds with B UE on 18.5 inch plinth.  (07/10/21); 20 seconds with B UE on thighs from 18.5 inch plinth (09/04/2021); 19 seconds with B UE on thighs from 18.5 inch plinth (10/04/2021); 22 seconds with B UE on thighs from 18.5 inch plinth (11/21/2021); 20 seconds with B UE on thighs from 18.5 inch plinth (01/01/2022); 19 seconds without use of B UE support (02/15/2022); 21 seconds with no UE support from 18.5 inch plinth (03/27/2022); 20.5 seconds no UE support and 18" surface (05/17/2022); 20 seconds with no UE support from 18.5 inch plinth (08/20/2022);  Goal  status: in progress     5.  Patient be able to ascend and descend stairs at her home mod I with use of B handrails.  Baseline: able to ascend/descend 4 steps in the clinic with B UE support on handrails and supervision - reports currently needs assistance to descend steps at home. (07/10/21); able to ascend/descend 4 steps in the clinic with B UE support on handrails and supervision, leading up and down with left. able to perform with reversed LE leads (09/04/2021); able to ascend/descend 4 steps in the clinic with B UE support on handrails and supervision, step over step with heavy UE use on the way down (10/04/2021); able to ascend/descend 4 steps in the clinic with B UE support on handrails and supervision, step over step up and step to leading with R LE with heavy UE use on the way down (11/21/2021); ascend/descends 4 steps in the clinic with B UE support on handrails and supervision, step over step up and down with heavy UE use on the way down (01/01/2022); able to ascend/descend 4 steps in the clinic with B UE support on handrails and supervision, step over step up and down with heavy UE use on the way down. leads up with right foot step to gait (02/15/2022; 03/27/22); able to ascend/descend 4 steps in the clinic with B UE support on handrails and supervision, step over step up and down with moderately heavy UE use on the way down (08/20/2022);  Goal status: MET   6.  Patient will demonstrate ability to perform static balance in tandem stance for equal or greater than 30 seconds on each side, to demonstrate improved balance and decreased fall risk.  Baseline: right foot front 10 seconds, left foot front 7 seconds (07/10/2021); right foot front 4 seconds, left foot front 6 seconds (09/04/2021); right foot front 9 seconds, left foot front 31 seconds (10/04/2021); right foot front 6.5 seconds, left foot front 8.5 seconds (11/21/2021); right foot front 8 seconds, left foot front 7 seconds (01/01/2022);  right foot  front 9 seconds, left foot front 20 seconds (02/15/2022); right foot front 30 seconds, left foot front 45 seconds (03/27/2022); right foot front 16 seconds, left foot front 17 seconds  Goal status: MET 03/27/22     PLAN: PT FREQUENCY: 2x/week   PT DURATION: 12 weeks   PLANNED INTERVENTIONS: Therapeutic exercises, Therapeutic activity, Neuromuscular re-education, Balance training, Gait training, Patient/Family education, Joint mobilization, Orthotic/Fit training, DME instructions, Dry Needling, Electrical stimulation, Cryotherapy, Moist heat, Splintting, Taping, and Manual therapy   PLAN FOR NEXT SESSION: continue with moderate intensity functional strengthening and balance exercises as able, address back and right glute pain to allow return to functional strengthening and balance with better tolerance.     [name]. Ilsa Iha, PT, DPT 08/28/22, 5:44 PM  [facility] [address] [place], [place] [address] P: [phone] I F: [phone]

## 2022-09-04 ENCOUNTER — Ambulatory Visit: Payer: 59 | Admitting: Physical Therapy

## 2022-09-05 ENCOUNTER — Ambulatory Visit (INDEPENDENT_AMBULATORY_CARE_PROVIDER_SITE_OTHER): Payer: 59 | Admitting: Podiatry

## 2022-09-05 ENCOUNTER — Encounter: Payer: Self-pay | Admitting: Podiatry

## 2022-09-05 DIAGNOSIS — L603 Nail dystrophy: Secondary | ICD-10-CM | POA: Diagnosis not present

## 2022-09-05 DIAGNOSIS — L608 Other nail disorders: Secondary | ICD-10-CM | POA: Diagnosis not present

## 2022-09-05 NOTE — Progress Notes (Signed)
Subjective:  Patient ID: [name], female    DOB: March 03, 1976,  MRN: [id number] HPI Chief Complaint  Patient presents with   Nail Problem    Toenails bilateral - thick, discolored nails, tried trimming, but did cut the tip of the hallux left, tried topical OTC - no help, diabetic - 6.6   New Patient (Initial Visit)    47 y.o. female presents with the above complaint.   ROS: Denies fever chills nausea vomit muscle aches pains calf pain back pain chest pain shortness of breath.    Past Medical History:  Diagnosis Date   Alcohol abuse    Anxiety    Anxiety    Anxiety and depression    Asthma    Bipolar disorder ([name])    Borderline personality disorder ([name])    Depression    Diabetes ([name])    Type 2    Dyslipidemia    Esophageal varices ([name])    Foot drop    right   [name] ([name])    GERD (gastroesophageal reflux disease)    Gout    History of drug abuse ([name])    Insomnia    Muscular dystrophies ([name])    Neuropathy    Pancreatitis    Recovering alcoholic ([name])    Past Surgical History:  Procedure Laterality Date   ESOPHAGOGASTRODUODENOSCOPY N/A 04/25/2022   Procedure: ESOPHAGOGASTRODUODENOSCOPY (EGD);  Surgeon: [name], MD;  Location: [facility] ENDOSCOPY;  Service: Gastroenterology;  Laterality: N/A;   HERNIA REPAIR     IR FLUORO GUIDE CV LINE RIGHT  12/30/2020   IR REMOVAL TUN CV CATH W/O FL  01/19/2021   IR US GUIDE VASC ACCESS RIGHT  12/30/2020    Current Outpatient Medications:    B-D UF III MINI PEN NEEDLES 31G X 5 MM MISC, Inject into the skin., Disp: , Rfl:    Continuous Glucose Sensor (DEXCOM G7 SENSOR) MISC, USE EVERY 10 DAYS AS DIRECTED, Disp: , Rfl:    metFORMIN (GLUCOPHAGE-XR) 750 MG 24 hr tablet, Take 750 mg by mouth 2 (two) times daily., Disp: , Rfl:    Accu-Chek Softclix Lancets lancets, SMARTSIG:1 Topical Daily, Disp: , Rfl:    albuterol (VENTOLIN HFA) 108 (90 Base) MCG/ACT inhaler, Inhale 2 puffs into the lungs  every 6 (six) hours as needed for wheezing., Disp: 6.7 g, Rfl: 1   amitriptyline (ELAVIL) 25 MG tablet, Take 25 mg by mouth at bedtime., Disp: , Rfl:    atorvastatin (LIPITOR) 40 MG tablet, Take 1 tablet (40 mg total) by mouth daily., Disp: 30 tablet, Rfl: 2   dicyclomine (BENTYL) 20 MG tablet, Take 20 mg by mouth 3 (three) times daily., Disp: , Rfl:    FLUoxetine (PROZAC) 40 MG capsule, Take 2 capsules (80 mg total) by mouth daily., Disp: 30 capsule, Rfl: 1   fluticasone (FLONASE) 50 MCG/ACT nasal spray, Place 1 spray into both nostrils daily., Disp: 9.9 mL, Rfl: 1   hydrOXYzine (ATARAX) 10 MG tablet, Take 10 mg by mouth 2 (two) times daily as needed., Disp: , Rfl:    insulin lispro (HUMALOG) 100 UNIT/ML injection, Inject 0.12 mLs (12 Units total) into the skin 3 (three) times daily with meals., Disp: 10 mL, Rfl: 3   lactulose (CHRONULAC) 10 GM/15ML solution, Take 30 mLs (20 g total) by mouth 2 (two) times daily as needed for mild constipation., Disp: 236 mL, Rfl: 1   LANTUS SOLOSTAR 100 UNIT/ML Solostar Pen, Inject 27 mLs into the skin 2 (two) times  daily., Disp: , Rfl:    lisinopril (ZESTRIL) 10 MG tablet, Take 10 mg by mouth daily., Disp: , Rfl:    magnesium oxide (MAG-OX) 400 (240 Mg) MG tablet, Take 1 tablet (400 mg total) by mouth 2 (two) times daily., Disp: , Rfl:    methocarbamol (ROBAXIN) 500 MG tablet, Take 500 mg by mouth 3 (three) times daily as needed., Disp: , Rfl:    Mirabegron (MYRBETRIQ PO), Take by mouth., Disp: , Rfl:    mirtazapine (REMERON) 15 MG tablet, Take 15 mg by mouth at bedtime., Disp: , Rfl:    Multiple Vitamin (MULTIVITAMIN WITH MINERALS) TABS tablet, Take 1 tablet by mouth daily., Disp: 30 tablet, Rfl: 1   OLANZapine (ZYPREXA) 5 MG tablet, Take 1 tablet (5 mg total) by mouth at bedtime., Disp: 30 tablet, Rfl: 0   OZEMPIC, 0.25 OR 0.5 MG/DOSE, 2 MG/3ML SOPN, once a week., Disp: , Rfl:    pantoprazole (PROTONIX) 40 MG tablet, Take 1 tablet (40 mg total) by mouth  daily., Disp: 30 tablet, Rfl: 2   phentermine (ADIPEX-P) 37.5 MG tablet, Take 37.5 mg by mouth daily., Disp: , Rfl:    pregabalin (LYRICA) 150 MG capsule, Take 1 capsule (150 mg total) by mouth 3 (three) times daily., Disp: 30 capsule, Rfl: 0   promethazine (PHENERGAN) 25 MG tablet, Take 25 mg by mouth every 6 (six) hours as needed., Disp: , Rfl:    VRAYLAR 3 MG capsule, Take 3 mg by mouth daily., Disp: , Rfl:   Allergies  Allergen Reactions   Erythromycin Anaphylaxis   Sulfa Antibiotics Anaphylaxis   Tylenol [Acetaminophen] Other (See Comments)    Pancreatitis and liver dysfunction, not supposed to take med   Nsaids Other (See Comments)    Causes GI problems, very bad reaction-per patient.    Gabapentin     Mood changes    Review of Systems Objective:  There were no vitals filed for this visit.  General: Well developed, nourished, in no acute distress, alert and oriented x3   Dermatological: Skin is warm, dry and supple bilateral. Nails x 10 are thick yellow dystrophic possibly mycotic remaining integument appears unremarkable at this time. There are no open sores, no preulcerative lesions, no rash or signs of infection present.  Vascular: Dorsalis Pedis artery and Posterior Tibial artery pedal pulses are 1/4 bilateral with immedate capillary fill time. Pedal hair growth present. No varicosities and no lower extremity edema present bilateral.   Neruologic: Grossly intact via light touch bilateral. Vibratory intact via tuning fork bilateral. Protective threshold with Semmes Wienstein monofilament diminished to all pedal sites bilateral. Patellar and Achilles deep tendon reflexes not elicitable no Babinski or clonus noted bilateral.   Musculoskeletal: No gross boney pedal deformities bilateral. No pain, crepitus, or limitation noted with foot and ankle range of motion bilateral. Muscular strength 3/5 in all groups tested bilateral.  Gait: Unassisted, Nonantalgic.     Radiographs:  None taken  Assessment & Plan:   Assessment: Nail dystrophy  Plan: Samples of skin and nail were taken today for pathologic evaluation follow-up with her in 1 month     [name] T. [place], [place]

## 2022-09-11 ENCOUNTER — Encounter: Payer: Self-pay | Admitting: Physical Therapy

## 2022-09-11 ENCOUNTER — Ambulatory Visit: Payer: 59 | Admitting: Physical Therapy

## 2022-09-11 DIAGNOSIS — R2689 Other abnormalities of gait and mobility: Secondary | ICD-10-CM

## 2022-09-11 DIAGNOSIS — Z9181 History of falling: Secondary | ICD-10-CM

## 2022-09-11 DIAGNOSIS — M6281 Muscle weakness (generalized): Secondary | ICD-10-CM

## 2022-09-11 NOTE — Therapy (Signed)
OUTPATIENT PHYSICAL THERAPY TREATMENT NOTE  Patient Name: [name] MRN: [id number] DOB:30-Oct-1975, 47 y.o., female Today's Date: 09/11/2022  PCP: Eunice Blase, PA-C REFERRING PROVIDER: Eunice Blase, PA-C  END OF SESSION:   PT End of Session - 09/11/22 1446     Visit Number 46    Number of Visits 63    Date for PT Re-Evaluation 11/12/22    Authorization Type [employer] MEDICARE/Medicaid reporting period from 05/22/2022    Progress Note Due on Visit 50    PT Start Time 1440    PT Stop Time 1518    PT Time Calculation (min) 38 min    Equipment Utilized During Treatment Gait belt    Behavior During Therapy WFL for tasks assessed/performed                Past Medical History:  Diagnosis Date   Alcohol abuse    Anxiety    Anxiety    Anxiety and depression    Asthma    Bipolar disorder ([name])    Borderline personality disorder ([name])    Depression    Diabetes ([name])    Type 2    Dyslipidemia    Esophageal varices ([name])    Foot drop    right   [name] ([name])    GERD (gastroesophageal reflux disease)    Gout    History of drug abuse ([name])    Insomnia    Muscular dystrophies ([name])    Neuropathy    Pancreatitis    Recovering alcoholic ([name])    Past Surgical History:  Procedure Laterality Date   ESOPHAGOGASTRODUODENOSCOPY N/A 04/25/2022   Procedure: ESOPHAGOGASTRODUODENOSCOPY (EGD);  Surgeon: [name], MD;  Location: [facility] ENDOSCOPY;  Service: Gastroenterology;  Laterality: N/A;   HERNIA REPAIR     IR FLUORO GUIDE CV LINE RIGHT  12/30/2020   IR REMOVAL TUN CV CATH W/O FL  01/19/2021   IR US GUIDE VASC ACCESS RIGHT  12/30/2020   Patient Active Problem List   Diagnosis Date Noted   Gait abnormality 05/24/2022   Right hand weakness 05/24/2022   Frequent falls 02/12/2021   Leukocytosis    Aspiration pneumonia of both lower lobes due to gastric secretions ([name]) 01/04/2021   Pressure injury of skin 12/24/2020   Acute hepatic  encephalopathy ([name]) 12/20/2020   Sepsis ([name]) 12/19/2020   Acute hyponatremia 12/18/2020   Alcohol use disorder, moderate, dependence ([name]) 12/18/2020   Hepatic steatosis 12/18/2020   Polysubstance abuse ([name]) 12/18/2020   Hyperglycemia due to type 2 diabetes mellitus ([name]) 12/18/2020   Hyperbilirubinemia 12/18/2020   Lactic acidosis 12/18/2020   Alcoholic liver disease ([name]) 12/18/2020   Bipolar 1 disorder, manic, moderate ([name]) 09/10/2018   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   Diabetes ([name]) 09/10/2018   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   Chronic gout of foot 09/29/2015   Muscular dystrophy ([name]) 09/29/2015   Type 2 diabetes mellitus without complication ([name]) 09/29/2015   Anxiety and depression 10/01/2012   Protein-calorie malnutrition, severe ([name]) 09/30/2012   Trichomonas infection 09/26/2012   Dehydration 09/26/2012   Hypokalemia 11/14/2011   Thrombocytopenia ([name]) 11/14/2011   Bipolar 2 disorder ([name]) 11/13/2011   Gout 11/13/2011     REFERRING DIAG: bilateral leg weakness  THERAPY DIAG:  Other abnormalities of gait and mobility  Muscle weakness (generalized)  History of falling  Rationale for Evaluation and Treatment Rehabilitation  PERTINENT HISTORY: Patient is a 47 y.o. female who presents to outpatient physical therapy with a referral  for medical diagnosis bilateral leg weakness. This patient's chief complaints consist of difficulty with weakness and balance with history of frequent falling leading to the following functional deficits: difficulty with basic mobility including ambulation, stairs, transfers; difficulty with ADLs, IADLs. Currently requires assistance to navigate stairs, assistance with ADLs and  IADLs, and ambulates with RW when she previously did not need an assistive device. Relevant past medical history and comorbidities include muscular dystrophy (FSHD - facioscapulohumeral muscular dystrophy), anxiety and depression,  bipolar 1 disorder, borderline personality disorder, diabetes, GERD, gout, pancreatitis, alcoholic liver disease, tobacco abuse,  foot drop, neuropathy, recovering alcoholic, history of drug abuse, history of hernia repair.  Patient denies hx of cancer, stroke, seizures, lung problems, heart problems, unexplained weight loss, unexplained changes in bowel or bladder problems, unexplained stumbling or dropping things, osteoporosis, and spinal surgery  PRECAUTIONS: Fall, not drink alcohol.  SUBJECTIVE: Patient reports she is doing well. She has been doing her HEP. She was tired after last PT session.   PAIN: NPRS 2/10 right lower back  OBJECTIVE  TODAY'S TREATMENT: Therapeutic exercise: to centralize symptoms and improve ROM, strength, muscular endurance, and activity tolerance required for successful completion of functional activities.  - ambulation over ground with rollator, 200 feet. (Limited by pain in right glute/lower back and fatigue).  - sit <> stand 3x6 from 18.5 inch table.  - seated toe raises (DF), 3x6. - seated LAQ, 3x6 each side with 7.5#AW.  - standing hip abduction, 3x6 each direction. (Seated rest between 2nd and 3rd set).  - prone press up, 3x5 - hooklying glute bridge, 3x6 - hooklying hip abduction, 3x6 with GTB - hooklying glute bridge with abduction against GTB, 3x5.   Pt required multimodal cuing for proper technique and to facilitate improved neuromuscular control, strength, range of motion, and functional ability resulting in improved performance and form.   PATIENT EDUCATION:  Education details: form/technique with exercise. Recommendation to contact PCP about tachycardia, why tachycardia is not desirable.  Person educated: Patient Education method: Explanation, demonstration, verbal cuing, tactile cuing. Education comprehension: verbalized understanding, demonstrated understanding, and needs further education      HOME EXERCISE PROGRAM: Access Code:  [email or web address] URL: [email or web address] Date: 08/20/2022 Prepared by: Norton Blizzard  Exercises - Sit to Stand Without Arm Support  - 3 x weekly - 3 sets - 5 reps - Seated Toe Raise  - 3 x weekly - 3 sets - 6 reps - 1 second hold - Supine Lower Trunk Rotation  - 3 x weekly - 1 sets - 20 reps - 1-5 seconds hold - Prone Press Up  - 3 x weekly - 2 sets - 5 reps    ASSESSMENT:   CLINICAL IMPRESSION: Patient arrives reporting decreased pain and good participation in HEP. Today's session continued focus on improving LE, core, and functional strength with reps in each set limited to accommodate muscular fatigue. Patient would benefit from continued management of limiting condition by skilled physical therapist to address remaining impairments and functional limitations to work towards stated goals and return to PLOF or maximal functional independence.   OBJECTIVE IMPAIRMENTS Abnormal gait, decreased activity tolerance, decreased balance, decreased coordination, decreased endurance, decreased mobility, difficulty walking, decreased ROM, decreased strength, impaired perceived functional ability, increased muscle spasms, impaired tone, impaired UE functional use, improper body mechanics, postural dysfunction, and pain.    ACTIVITY LIMITATIONS cleaning, community activity, driving, meal prep, laundry, and shopping.    PERSONAL FACTORS Behavior pattern, Fitness, Past/current experiences, Time since onset of injury/illness/exacerbation, Transportation, and  3+ comorbidities:    muscular dystrophy (FSHD - facioscapulohumeral muscular dystrophy), anxiety and depression, bipolar 1 disorder, borderline personality disorder, diabetes, GERD, gout, pancreatitis, alcoholic liver disease, tobacco abuse,  foot drop, neuropathy, recovering alcoholic, history of drug abuse, history of hernia repair are also affecting patient's functional outcome.      REHAB POTENTIAL: Good   CLINICAL DECISION MAKING:  Evolving/moderate complexity   EVALUATION COMPLEXITY: Moderate     GOALS: Goals reviewed with patient? No   SHORT TERM GOALS: Target date: 07/24/2021   Patient will be independent with initial home exercise program for self-management of symptoms. Baseline: Initial HEP to be provided at visit 2 as appropriate (07/10/21); Goal status: Met     LONG TERM GOALS: Target date: 10/02/2021; updated to 12/27/2021 for all unmet goals on 10/04/2021; updated to 03/26/2022 for all unmet goals on 01/01/2022; updated to 06/19/2022 for all unmet goals on 03/27/2022; updated to 08/14/2022 for all unmet goals on 05/22/2022, updated to 11/12/2022 for all unmet goals on 08/20/2022.    Patient will be independent with a long-term home exercise program for self-management of symptoms.  Baseline: Initial HEP to be provided at visit 2 as appropriate (07/10/21); participating as tolerated, recently too much pain to participate (09/04/2021); participating regularly (10/04/2021); participating as able (11/21/2021); not participating regularly over last couple of weeks (01/01/2022); no participating regularly since last progress note (02/15/2022); improving participation (03/27/2022); has not last two weeks due to recent fall (05/17/22); has not been participating (08/20/2022);  Goal status: Ongoing   2.  Patient will demonstrate improved FOTO score by 10 points from baseline to demonstrate improvement in overall condition and self-reported functional ability.  Baseline: to be measured visit 2 as appropriate (07/10/21); 47 at visit # 2 (07/17/2021); 50 at visit # 10 (09/04/2021); 53 at visit #15 (10/04/2021); 50 at visit # 20 (11/21/2021); 52 at visit # 26 (01/01/2022); 50 at visit #30 (02/15/2022); 54 at visit # 34 (03/27/22);  53 (05/17/22); 51 at visit #44 (08/20/2022); Goal status: In-Progress   3.  Patient will ambulate equal or greater than 1000 feet during 6 Min Walk Test using [facility] or less restrictive assistive device to improve community  ambulation.  Baseline: To be tested visit 2 as appropriate (07/10/21); 310 feet with RW (07/17/2021); 556 feet with rollator (09/04/2021); 600 feet with rollator (10/04/2021); 300 feet with rollator - had to stop test at 3 min due to intolerable right glute pain (11/21/2021); 420 feet with rollator and steppage gait, R > L. Stopped at 5 min due to feeling of instability in low back to mid thoracic spine (01/01/2022); 573 feet with rollator and steppage gait, R > L. Occasional standing breaks. Low back pain increased to 3.5/10 (02/15/2022);  381 feet with rollator and steppage gait, R > L. Stopped at 4:30 min due to pain in low back and right glute (03/27/2022); 124' in 1 minute 22 sec. Had to discontinue due to R back/hip pain (05/17/2022); 200 feet with rollator, discontinued due to low back/glute pain (08/20/2022);  Goal status: In-Progress   4.  Patient will complete 5 Time Sit To Stand test in equal or less than 20 seconds with no UE support from 18.5 inch plinth to demonstrate improved LE functional strength and power for basic mobility and improved fall risk.  Baseline: 24 seconds with B UE on 18.5 inch plinth.  (07/10/21); 20 seconds with B UE on thighs from 18.5 inch plinth (09/04/2021); 19 seconds with B UE on thighs from 18.5 inch  plinth (10/04/2021); 22 seconds with B UE on thighs from 18.5 inch plinth (11/21/2021); 20 seconds with B UE on thighs from 18.5 inch plinth (01/01/2022); 19 seconds without use of B UE support (02/15/2022); 21 seconds with no UE support from 18.5 inch plinth (03/27/2022); 20.5 seconds no UE support and 18" surface (05/17/2022); 20 seconds with no UE support from 18.5 inch plinth (08/20/2022);  Goal status: in progress     5.  Patient be able to ascend and descend stairs at her home mod I with use of B handrails.  Baseline: able to ascend/descend 4 steps in the clinic with B UE support on handrails and supervision - reports currently needs assistance to descend steps at home. (07/10/21);  able to ascend/descend 4 steps in the clinic with B UE support on handrails and supervision, leading up and down with left. able to perform with reversed LE leads (09/04/2021); able to ascend/descend 4 steps in the clinic with B UE support on handrails and supervision, step over step with heavy UE use on the way down (10/04/2021); able to ascend/descend 4 steps in the clinic with B UE support on handrails and supervision, step over step up and step to leading with R LE with heavy UE use on the way down (11/21/2021); ascend/descends 4 steps in the clinic with B UE support on handrails and supervision, step over step up and down with heavy UE use on the way down (01/01/2022); able to ascend/descend 4 steps in the clinic with B UE support on handrails and supervision, step over step up and down with heavy UE use on the way down. leads up with right foot step to gait (02/15/2022; 03/27/22); able to ascend/descend 4 steps in the clinic with B UE support on handrails and supervision, step over step up and down with moderately heavy UE use on the way down (08/20/2022);  Goal status: MET   6.  Patient will demonstrate ability to perform static balance in tandem stance for equal or greater than 30 seconds on each side, to demonstrate improved balance and decreased fall risk.  Baseline: right foot front 10 seconds, left foot front 7 seconds (07/10/2021); right foot front 4 seconds, left foot front 6 seconds (09/04/2021); right foot front 9 seconds, left foot front 31 seconds (10/04/2021); right foot front 6.5 seconds, left foot front 8.5 seconds (11/21/2021); right foot front 8 seconds, left foot front 7 seconds (01/01/2022);  right foot front 9 seconds, left foot front 20 seconds (02/15/2022); right foot front 30 seconds, left foot front 45 seconds (03/27/2022); right foot front 16 seconds, left foot front 17 seconds  Goal status: MET 03/27/22     PLAN: PT FREQUENCY: 2x/week   PT DURATION: 12 weeks   PLANNED INTERVENTIONS:  Therapeutic exercises, Therapeutic activity, Neuromuscular re-education, Balance training, Gait training, Patient/Family education, Joint mobilization, Orthotic/Fit training, DME instructions, Dry Needling, Electrical stimulation, Cryotherapy, Moist heat, Splintting, Taping, and Manual therapy   PLAN FOR NEXT SESSION: continue with moderate intensity functional strengthening and balance exercises as able, address back and right glute pain to allow return to functional strengthening and balance with better tolerance.     [name]. Ilsa Iha, PT, DPT 09/11/22, 3:18 PM  [facility] [address] [place], [place] [address] P: [phone] I F: [phone]

## 2022-09-12 ENCOUNTER — Encounter: Payer: 59 | Admitting: Physical Therapy

## 2022-09-17 ENCOUNTER — Ambulatory Visit: Payer: 59 | Attending: Internal Medicine

## 2022-09-17 DIAGNOSIS — Z9181 History of falling: Secondary | ICD-10-CM | POA: Diagnosis not present

## 2022-09-17 DIAGNOSIS — M6281 Muscle weakness (generalized): Secondary | ICD-10-CM | POA: Insufficient documentation

## 2022-09-17 DIAGNOSIS — R2689 Other abnormalities of gait and mobility: Secondary | ICD-10-CM | POA: Diagnosis not present

## 2022-09-17 NOTE — Therapy (Addendum)
OUTPATIENT PHYSICAL THERAPY TREATMENT   Patient Name: [name] MRN: [id number] DOB:Jul 14, 1975, 47 y.o., female Today's Date: 09/17/2022  PCP: Eunice Blase, PA-C REFERRING PROVIDER: Eunice Blase, PA-C  END OF SESSION:   PT End of Session - 09/17/22 1359     Visit Number 47    Number of Visits 63    Date for PT Re-Evaluation 11/12/22    Authorization Type [employer] MEDICARE/Medicaid reporting period from 05/22/2022    Progress Note Due on Visit 50    PT Start Time 1353    PT Stop Time 1431    PT Time Calculation (min) 38 min    Equipment Utilized During Treatment Gait belt    Activity Tolerance Patient tolerated treatment well;No increased pain    Behavior During Therapy WFL for tasks assessed/performed                Past Medical History:  Diagnosis Date   Alcohol abuse    Anxiety    Anxiety    Anxiety and depression    Asthma    Bipolar disorder ([name])    Borderline personality disorder ([name])    Depression    Diabetes ([name])    Type 2    Dyslipidemia    Esophageal varices ([name])    Foot drop    right   [name] ([name])    GERD (gastroesophageal reflux disease)    Gout    History of drug abuse ([name])    Insomnia    Muscular dystrophies ([name])    Neuropathy    Pancreatitis    Recovering alcoholic ([name])    Past Surgical History:  Procedure Laterality Date   ESOPHAGOGASTRODUODENOSCOPY N/A 04/25/2022   Procedure: ESOPHAGOGASTRODUODENOSCOPY (EGD);  Surgeon: [name], MD;  Location: [facility];  Service: Gastroenterology;  Laterality: N/A;   HERNIA REPAIR     IR FLUORO GUIDE CV LINE RIGHT  12/30/2020   IR REMOVAL TUN CV CATH W/O FL  01/19/2021   IR US GUIDE VASC ACCESS RIGHT  12/30/2020   Patient Active Problem List   Diagnosis Date Noted   Gait abnormality 05/24/2022   Right hand weakness 05/24/2022   Frequent falls 02/12/2021   Leukocytosis    Aspiration pneumonia of both lower lobes due to gastric secretions ([name])  01/04/2021   Pressure injury of skin 12/24/2020   Acute hepatic encephalopathy ([name]) 12/20/2020   Sepsis ([name]) 12/19/2020   Acute hyponatremia 12/18/2020   Alcohol use disorder, moderate, dependence ([name]) 12/18/2020   Hepatic steatosis 12/18/2020   Polysubstance abuse ([name]) 12/18/2020   Hyperglycemia due to type 2 diabetes mellitus ([name]) 12/18/2020   Hyperbilirubinemia 12/18/2020   Lactic acidosis 12/18/2020   Alcoholic liver disease ([name]) 12/18/2020   Bipolar 1 disorder, manic, moderate ([name]) 09/10/2018   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   Diabetes ([name]) 09/10/2018   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   Chronic gout of foot 09/29/2015   Muscular dystrophy ([name]) 09/29/2015   Type 2 diabetes mellitus without complication ([name]) 09/29/2015   Anxiety and depression 10/01/2012   Protein-calorie malnutrition, severe ([name]) 09/30/2012   Trichomonas infection 09/26/2012   Dehydration 09/26/2012   Hypokalemia 11/14/2011   Thrombocytopenia ([name]) 11/14/2011   Bipolar 2 disorder ([name]) 11/13/2011   Gout 11/13/2011     REFERRING DIAG: bilateral leg weakness  THERAPY DIAG:  Other abnormalities of gait and mobility  Muscle weakness (generalized)  History of falling  Rationale for Evaluation and Treatment Rehabilitation  PERTINENT HISTORY: Patient is a 47  y.o. female who presents to outpatient physical therapy with a referral for medical diagnosis bilateral leg weakness. This patient's chief complaints consist of difficulty with weakness and balance with history of frequent falling leading to the following functional deficits: difficulty with basic mobility including ambulation, stairs, transfers; difficulty with ADLs, IADLs. Currently requires assistance to navigate stairs, assistance with ADLs and  IADLs, and ambulates with RW when she previously did not need an assistive device. Relevant past medical history and comorbidities include muscular dystrophy (FSHD -  facioscapulohumeral muscular dystrophy), anxiety and depression, bipolar 1 disorder, borderline personality disorder, diabetes, GERD, gout, pancreatitis, alcoholic liver disease, tobacco abuse,  foot drop, neuropathy, recovering alcoholic, history of drug abuse, history of hernia repair.  Patient denies hx of cancer, stroke, seizures, lung problems, heart problems, unexplained weight loss, unexplained changes in bowel or bladder problems, unexplained stumbling or dropping things, osteoporosis, and spinal surgery  PRECAUTIONS: Fall, not drink alcohol.  SUBJECTIVE: Patient reports she is doing well. Cleaned a lot the othe rday and has some increased back and Rt hip pain.   PAIN: NPRS 2/10 right lower back  OBJECTIVE  TODAY'S TREATMENT: Therapeutic exercise:    - ambulation (skipped due to elevated pain, symptoms today)   -Nustep, seat 9, arms 9, level 2 x6 minutes   - sit <> stand 3x6 from 18.5 inch table.    - seated toe raises on decline wedge (DF) 1x10 bilat, then 1x6 bilat    - standing hip abduction, 1x7 each direction  - seated LAQ, 1x9 7.5#AW  - standing hip abduction, 1x7 each direction  - seated LAQ, 1x9 7.5#AW   -sled push 2x76ft (sled only, 75lb)  -seated march x17, alternating sides  -sled push 2x53ft (sled only, 75lb)  -seated march x17, alternating sides    Pt required multimodal cuing for proper technique and to facilitate improved neuromuscular control, strength, range of motion, and functional ability resulting in improved performance and form.   PATIENT EDUCATION:  Education details: form/technique with exercise. Recommendation to contact PCP about tachycardia, why tachycardia is not desirable.  Person educated: Patient Education method: Explanation, demonstration, verbal cuing, tactile cuing. Education comprehension: verbalized understanding, demonstrated understanding, and needs further education      HOME EXERCISE PROGRAM: Access Code: [email or web address] URL:  [email or web address] Date: 08/20/2022 Prepared by: Norton Blizzard  Exercises - Sit to Stand Without Arm Support  - 3 x weekly - 3 sets - 5 reps - Seated Toe Raise  - 3 x weekly - 3 sets - 6 reps - 1 second hold - Supine Lower Trunk Rotation  - 3 x weekly - 1 sets - 20 reps - 1-5 seconds hold - Prone Press Up  - 3 x weekly - 2 sets - 5 reps    ASSESSMENT:   CLINICAL IMPRESSION: Continued to work on focal strength and endurance training. Pt has elevated pain on arrival from housework but otherwise is able to put forth good effort. Pt able to perform sled push today for first time. Patient would benefit from continued management of limiting condition by skilled physical therapist to address remaining impairments and functional limitations to work towards stated goals and return to PLOF or maximal functional independence.   OBJECTIVE IMPAIRMENTS Abnormal gait, decreased activity tolerance, decreased balance, decreased coordination, decreased endurance, decreased mobility, difficulty walking, decreased ROM, decreased strength, impaired perceived functional ability, increased muscle spasms, impaired tone, impaired UE functional use, improper body mechanics, postural dysfunction, and pain.    ACTIVITY LIMITATIONS cleaning, community activity, driving,  meal prep, laundry, and shopping.    PERSONAL FACTORS Behavior pattern, Fitness, Past/current experiences, Time since onset of injury/illness/exacerbation, Transportation, and 3+ comorbidities:    muscular dystrophy (FSHD - facioscapulohumeral muscular dystrophy), anxiety and depression, bipolar 1 disorder, borderline personality disorder, diabetes, GERD, gout, pancreatitis, alcoholic liver disease, tobacco abuse,  foot drop, neuropathy, recovering alcoholic, history of drug abuse, history of hernia repair are also affecting patient's functional outcome.      REHAB POTENTIAL: Good   CLINICAL DECISION MAKING: Evolving/moderate complexity    EVALUATION COMPLEXITY: Moderate     GOALS: Goals reviewed with patient? No   SHORT TERM GOALS: Target date: 07/24/2021   Patient will be independent with initial home exercise program for self-management of symptoms. Baseline: Initial HEP to be provided at visit 2 as appropriate (07/10/21); Goal status: Met     LONG TERM GOALS: Target date: 10/02/2021; updated to 12/27/2021 for all unmet goals on 10/04/2021; updated to 03/26/2022 for all unmet goals on 01/01/2022; updated to 06/19/2022 for all unmet goals on 03/27/2022; updated to 08/14/2022 for all unmet goals on 05/22/2022, updated to 11/12/2022 for all unmet goals on 08/20/2022.    Patient will be independent with a long-term home exercise program for self-management of symptoms.  Baseline: Initial HEP to be provided at visit 2 as appropriate (07/10/21); participating as tolerated, recently too much pain to participate (09/04/2021); participating regularly (10/04/2021); participating as able (11/21/2021); not participating regularly over last couple of weeks (01/01/2022); no participating regularly since last progress note (02/15/2022); improving participation (03/27/2022); has not last two weeks due to recent fall (05/17/22); has not been participating (08/20/2022);  Goal status: Ongoing   2.  Patient will demonstrate improved FOTO score by 10 points from baseline to demonstrate improvement in overall condition and self-reported functional ability.  Baseline: to be measured visit 2 as appropriate (07/10/21); 47 at visit # 2 (07/17/2021); 50 at visit # 10 (09/04/2021); 53 at visit #15 (10/04/2021); 50 at visit # 20 (11/21/2021); 52 at visit # 26 (01/01/2022); 50 at visit #30 (02/15/2022); 54 at visit # 34 (03/27/22);  53 (05/17/22); 51 at visit #44 (08/20/2022); Goal status: In-Progress   3.  Patient will ambulate equal or greater than 1000 feet during 6 Min Walk Test using [facility] or less restrictive assistive device to improve community ambulation.  Baseline: To be tested  visit 2 as appropriate (07/10/21); 310 feet with RW (07/17/2021); 556 feet with rollator (09/04/2021); 600 feet with rollator (10/04/2021); 300 feet with rollator - had to stop test at 3 min due to intolerable right glute pain (11/21/2021); 420 feet with rollator and steppage gait, R > L. Stopped at 5 min due to feeling of instability in low back to mid thoracic spine (01/01/2022); 573 feet with rollator and steppage gait, R > L. Occasional standing breaks. Low back pain increased to 3.5/10 (02/15/2022);  381 feet with rollator and steppage gait, R > L. Stopped at 4:30 min due to pain in low back and right glute (03/27/2022); 124' in 1 minute 22 sec. Had to discontinue due to R back/hip pain (05/17/2022); 200 feet with rollator, discontinued due to low back/glute pain (08/20/2022);  Goal status: In-Progress   4.  Patient will complete 5 Time Sit To Stand test in equal or less than 20 seconds with no UE support from 18.5 inch plinth to demonstrate improved LE functional strength and power for basic mobility and improved fall risk.  Baseline: 24 seconds with B UE on 18.5 inch plinth.  (07/10/21);  20 seconds with B UE on thighs from 18.5 inch plinth (09/04/2021); 19 seconds with B UE on thighs from 18.5 inch plinth (10/04/2021); 22 seconds with B UE on thighs from 18.5 inch plinth (11/21/2021); 20 seconds with B UE on thighs from 18.5 inch plinth (01/01/2022); 19 seconds without use of B UE support (02/15/2022); 21 seconds with no UE support from 18.5 inch plinth (03/27/2022); 20.5 seconds no UE support and 18" surface (05/17/2022); 20 seconds with no UE support from 18.5 inch plinth (08/20/2022);  Goal status: in progress     5.  Patient be able to ascend and descend stairs at her home mod I with use of B handrails.  Baseline: able to ascend/descend 4 steps in the clinic with B UE support on handrails and supervision - reports currently needs assistance to descend steps at home. (07/10/21); able to ascend/descend 4 steps in the  clinic with B UE support on handrails and supervision, leading up and down with left. able to perform with reversed LE leads (09/04/2021); able to ascend/descend 4 steps in the clinic with B UE support on handrails and supervision, step over step with heavy UE use on the way down (10/04/2021); able to ascend/descend 4 steps in the clinic with B UE support on handrails and supervision, step over step up and step to leading with R LE with heavy UE use on the way down (11/21/2021); ascend/descends 4 steps in the clinic with B UE support on handrails and supervision, step over step up and down with heavy UE use on the way down (01/01/2022); able to ascend/descend 4 steps in the clinic with B UE support on handrails and supervision, step over step up and down with heavy UE use on the way down. leads up with right foot step to gait (02/15/2022; 03/27/22); able to ascend/descend 4 steps in the clinic with B UE support on handrails and supervision, step over step up and down with moderately heavy UE use on the way down (08/20/2022);  Goal status: MET   6.  Patient will demonstrate ability to perform static balance in tandem stance for equal or greater than 30 seconds on each side, to demonstrate improved balance and decreased fall risk.  Baseline: right foot front 10 seconds, left foot front 7 seconds (07/10/2021); right foot front 4 seconds, left foot front 6 seconds (09/04/2021); right foot front 9 seconds, left foot front 31 seconds (10/04/2021); right foot front 6.5 seconds, left foot front 8.5 seconds (11/21/2021); right foot front 8 seconds, left foot front 7 seconds (01/01/2022);  right foot front 9 seconds, left foot front 20 seconds (02/15/2022); right foot front 30 seconds, left foot front 45 seconds (03/27/2022); right foot front 16 seconds, left foot front 17 seconds  Goal status: MET 03/27/22     PLAN: PT FREQUENCY: 2x/week   PT DURATION: 12 weeks   PLANNED INTERVENTIONS: Therapeutic exercises, Therapeutic  activity, Neuromuscular re-education, Balance training, Gait training, Patient/Family education, Joint mobilization, Orthotic/Fit training, DME instructions, Dry Needling, Electrical stimulation, Cryotherapy, Moist heat, Splintting, Taping, and Manual therapy   PLAN FOR NEXT SESSION: continue with moderate intensity functional strengthening and balance exercises as able, address back and right glute pain to allow return to functional strengthening and balance with better tolerance.     2:00 PM, 09/17/22 Rosamaria Lints, PT, DPT Physical Therapist - [place] [phone] (Office)   09/17/22, 2:00 PM  [facility] [address] [place], [place] [address] P: [phone] I F:  [phone] 

## 2022-09-18 ENCOUNTER — Telehealth: Payer: Self-pay | Admitting: *Deleted

## 2022-09-18 NOTE — Telephone Encounter (Signed)
-----   Message from Elinor Parkinson, [place] sent at 09/17/2022 11:50 AM EDT ----- Negative for fungus chronic trauma

## 2022-09-19 ENCOUNTER — Encounter: Payer: 59 | Admitting: Physical Therapy

## 2022-09-24 ENCOUNTER — Ambulatory Visit: Payer: 59 | Admitting: Physical Therapy

## 2022-09-26 ENCOUNTER — Ambulatory Visit: Payer: 59 | Admitting: Physical Therapy

## 2022-10-01 ENCOUNTER — Encounter: Payer: 59 | Admitting: Physical Therapy

## 2022-10-03 ENCOUNTER — Encounter: Payer: Self-pay | Admitting: Physical Therapy

## 2022-10-03 ENCOUNTER — Ambulatory Visit: Payer: 59 | Admitting: Physical Therapy

## 2022-10-03 DIAGNOSIS — M6281 Muscle weakness (generalized): Secondary | ICD-10-CM | POA: Diagnosis not present

## 2022-10-03 DIAGNOSIS — R2689 Other abnormalities of gait and mobility: Secondary | ICD-10-CM | POA: Diagnosis not present

## 2022-10-03 DIAGNOSIS — Z9181 History of falling: Secondary | ICD-10-CM | POA: Diagnosis not present

## 2022-10-03 NOTE — Therapy (Signed)
OUTPATIENT PHYSICAL THERAPY TREATMENT   Patient Name: [name] MRN: [id number] DOB:[birth year], 47 y.o., female Today's Date: 10/03/2022  PCP: Eunice Blase, PA-C REFERRING PROVIDER: Eunice Blase, PA-C  END OF SESSION:   PT End of Session - 10/03/22 1509     Visit Number 48    Number of Visits 63    Date for PT Re-Evaluation 11/12/22    Authorization Type [employer] MEDICARE/Medicaid reporting period from 05/22/2022    Progress Note Due on Visit 50    PT Start Time 1515    PT Stop Time 1555    PT Time Calculation (min) 40 min    Equipment Utilized During Treatment Gait belt    Activity Tolerance Patient tolerated treatment well    Behavior During Therapy WFL for tasks assessed/performed                 Past Medical History:  Diagnosis Date   Alcohol abuse    Anxiety    Anxiety    Anxiety and depression    Asthma    Bipolar disorder ([name])    Borderline personality disorder ([name])    Depression    Diabetes ([name])    Type 2    Dyslipidemia    Esophageal varices ([name])    Foot drop    right   [name] ([name])    GERD (gastroesophageal reflux disease)    Gout    History of drug abuse ([name])    Insomnia    Muscular dystrophies ([name])    Neuropathy    Pancreatitis    Recovering alcoholic ([name])    Past Surgical History:  Procedure Laterality Date   ESOPHAGOGASTRODUODENOSCOPY N/A 04/25/2022   Procedure: ESOPHAGOGASTRODUODENOSCOPY (EGD);  Surgeon: [name], MD;  Location: [facility] ENDOSCOPY;  Service: Gastroenterology;  Laterality: N/A;   HERNIA REPAIR     IR FLUORO GUIDE CV LINE RIGHT  12/30/2020   IR REMOVAL TUN CV CATH W/O FL  01/19/2021   IR US GUIDE VASC ACCESS RIGHT  12/30/2020   Patient Active Problem List   Diagnosis Date Noted   Gait abnormality 05/24/2022   Right hand weakness 05/24/2022   Frequent falls 02/12/2021   Leukocytosis    Aspiration pneumonia of both lower lobes due to gastric secretions ([name]) 01/04/2021    Pressure injury of skin 12/24/2020   Acute hepatic encephalopathy ([name]) 12/20/2020   Sepsis ([name]) 12/19/2020   Acute hyponatremia 12/18/2020   Alcohol use disorder, moderate, dependence ([name]) 12/18/2020   Hepatic steatosis 12/18/2020   Polysubstance abuse ([name]) 12/18/2020   Hyperglycemia due to type 2 diabetes mellitus ([name]) 12/18/2020   Hyperbilirubinemia 12/18/2020   Lactic acidosis 12/18/2020   Alcoholic liver disease ([name]) 12/18/2020   Bipolar 1 disorder, manic, moderate ([name]) 09/10/2018   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   Diabetes ([name]) 09/10/2018   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   Chronic gout of foot 09/29/2015   Muscular dystrophy ([name]) 09/29/2015   Type 2 diabetes mellitus without complication ([name]) 09/29/2015   Anxiety and depression 10/01/2012   Protein-calorie malnutrition, severe ([name]) 09/30/2012   Trichomonas infection 09/26/2012   Dehydration 09/26/2012   Hypokalemia 11/14/2011   Thrombocytopenia ([name]) 11/14/2011   Bipolar 2 disorder ([name]) 11/13/2011   Gout 11/13/2011     REFERRING DIAG: bilateral leg weakness  THERAPY DIAG:  Other abnormalities of gait and mobility  Muscle weakness (generalized)  History of falling  Rationale for Evaluation and Treatment Rehabilitation  PERTINENT HISTORY: Patient is a 47 y.o.  female who presents to outpatient physical therapy with a referral for medical diagnosis bilateral leg weakness. This patient's chief complaints consist of difficulty with weakness and balance with history of frequent falling leading to the following functional deficits: difficulty with basic mobility including ambulation, stairs, transfers; difficulty with ADLs, IADLs. Currently requires assistance to navigate stairs, assistance with ADLs and  IADLs, and ambulates with RW when she previously did not need an assistive device. Relevant past medical history and comorbidities include muscular dystrophy (FSHD -  facioscapulohumeral muscular dystrophy), anxiety and depression, bipolar 1 disorder, borderline personality disorder, diabetes, GERD, gout, pancreatitis, alcoholic liver disease, tobacco abuse,  foot drop, neuropathy, recovering alcoholic, history of drug abuse, history of hernia repair.  Patient denies hx of cancer, stroke, seizures, lung problems, heart problems, unexplained weight loss, unexplained changes in bowel or bladder problems, unexplained stumbling or dropping things, osteoporosis, and spinal surgery.   PRECAUTIONS: Fall, not drink alcohol.  SUBJECTIVE: Patient arrives with rollator. She states she did her HEP twice since last time she was at PT. She states she has increased pain in the right glute and low back that flared up after she tweaked it the other day. She reports no falls recently.  PAIN: NPRS 5.5/10 right lower back to right glute.   OBJECTIVE  TODAY'S TREATMENT: Manual therapy: to reduce pain and tissue tension, improve range of motion, neuromodulation, in order to promote improved ability to complete functional activities. PRONE - STM to bilateral lumbar paraspinals and right posterior hip musculature including gludes and deep hip rotators.   Modality: (unbilled) Dry needling performed to low back and right piriformis to decrease pain and spasms along patient's low back and right glute region with patient in prone utilizing 2 dry needle(s) .30mm x with 1 stick each level at right lumbar multifidi at L4 and L5 and one stick at right piriformis and 1 dry needle(s) .30mm x 75mm with 1 stick each level at left lumbar multifidi at L4 and L5. Patient educated about the risks and benefits from therapy and verbally consents to treatment and draped appropriately. Dry needling performed by [name]. Ilsa Iha PT, DPT who is certified in this technique.   Therapeutic exercise: to centralize symptoms and improve ROM, strength, muscular endurance, and activity tolerance required for  successful completion of functional activities.  - prone press up 3x5  - sit <> stand 3x6 from 18.5 inch table.  - sled push two sets of 2x74ft (sled only, 75lb)    Pt required multimodal cuing for proper technique and to facilitate improved neuromuscular control, strength, range of motion, and functional ability resulting in improved performance and form.   PATIENT EDUCATION:  Education details: form/technique with exercise. Recommendation to contact PCP about tachycardia, why tachycardia is not desirable.  Person educated: Patient Education method: Explanation, demonstration, verbal cuing, tactile cuing. Education comprehension: verbalized understanding, demonstrated understanding, and needs further education      HOME EXERCISE PROGRAM: Access Code: [email or web address] URL: [email or web address] Date: 08/20/2022 Prepared by: Norton Blizzard  Exercises - Sit to Stand Without Arm Support  - 3 x weekly - 3 sets - 5 reps - Seated Toe Raise  - 3 x weekly - 3 sets - 6 reps - 1 second hold - Supine Lower Trunk Rotation  - 3 x weekly - 1 sets - 20 reps - 1-5 seconds hold - Prone Press Up  - 3 x weekly - 2 sets - 5 reps    ASSESSMENT:   CLINICAL IMPRESSION: Continued to work  on focal strength and endurance training. Pt has elevated pain on arrival from housework but otherwise is able to put forth good effort. Pt able to perform sled push today for first time. Patient would benefit from continued management of limiting condition by skilled physical therapist to address remaining impairments and functional limitations to work towards stated goals and return to PLOF or maximal functional independence.   OBJECTIVE IMPAIRMENTS Abnormal gait, decreased activity tolerance, decreased balance, decreased coordination, decreased endurance, decreased mobility, difficulty walking, decreased ROM, decreased strength, impaired perceived functional ability, increased muscle spasms, impaired tone, impaired UE  functional use, improper body mechanics, postural dysfunction, and pain.    ACTIVITY LIMITATIONS cleaning, community activity, driving, meal prep, laundry, and shopping.    PERSONAL FACTORS Behavior pattern, Fitness, Past/current experiences, Time since onset of injury/illness/exacerbation, Transportation, and 3+ comorbidities:    muscular dystrophy (FSHD - facioscapulohumeral muscular dystrophy), anxiety and depression, bipolar 1 disorder, borderline personality disorder, diabetes, GERD, gout, pancreatitis, alcoholic liver disease, tobacco abuse,  foot drop, neuropathy, recovering alcoholic, history of drug abuse, history of hernia repair are also affecting patient's functional outcome.      REHAB POTENTIAL: Good   CLINICAL DECISION MAKING: Evolving/moderate complexity   EVALUATION COMPLEXITY: Moderate     GOALS: Goals reviewed with patient? No   SHORT TERM GOALS: Target date: 07/24/2021   Patient will be independent with initial home exercise program for self-management of symptoms. Baseline: Initial HEP to be provided at visit 2 as appropriate (07/10/21); Goal status: Met     LONG TERM GOALS: Target date: 10/02/2021; updated to 12/27/2021 for all unmet goals on 10/04/2021; updated to 03/26/2022 for all unmet goals on 01/01/2022; updated to 06/19/2022 for all unmet goals on 03/27/2022; updated to 08/14/2022 for all unmet goals on 05/22/2022, updated to 11/12/2022 for all unmet goals on 08/20/2022.    Patient will be independent with a long-term home exercise program for self-management of symptoms.  Baseline: Initial HEP to be provided at visit 2 as appropriate (07/10/21); participating as tolerated, recently too much pain to participate (09/04/2021); participating regularly (10/04/2021); participating as able (11/21/2021); not participating regularly over last couple of weeks (01/01/2022); no participating regularly since last progress note (02/15/2022); improving participation (03/27/2022); has not last two  weeks due to recent fall (05/17/22); has not been participating (08/20/2022);  Goal status: Ongoing   2.  Patient will demonstrate improved FOTO score by 10 points from baseline to demonstrate improvement in overall condition and self-reported functional ability.  Baseline: to be measured visit 2 as appropriate (07/10/21); 47 at visit # 2 (07/17/2021); 50 at visit # 10 (09/04/2021); 53 at visit #15 (10/04/2021); 50 at visit # 20 (11/21/2021); 52 at visit # 26 (01/01/2022); 50 at visit #30 (02/15/2022); 54 at visit # 34 (03/27/22);  53 (05/17/22); 51 at visit #44 (08/20/2022); Goal status: In-Progress   3.  Patient will ambulate equal or greater than 1000 feet during 6 Min Walk Test using [facility] or less restrictive assistive device to improve community ambulation.  Baseline: To be tested visit 2 as appropriate (07/10/21); 310 feet with RW (07/17/2021); 556 feet with rollator (09/04/2021); 600 feet with rollator (10/04/2021); 300 feet with rollator - had to stop test at 3 min due to intolerable right glute pain (11/21/2021); 420 feet with rollator and steppage gait, R > L. Stopped at 5 min due to feeling of instability in low back to mid thoracic spine (01/01/2022); 573 feet with rollator and steppage gait, R > L. Occasional standing breaks.  Low back pain increased to 3.5/10 (02/15/2022);  381 feet with rollator and steppage gait, R > L. Stopped at 4:30 min due to pain in low back and right glute (03/27/2022); 124' in 1 minute 22 sec. Had to discontinue due to R back/hip pain (05/17/2022); 200 feet with rollator, discontinued due to low back/glute pain (08/20/2022);  Goal status: In-Progress   4.  Patient will complete 5 Time Sit To Stand test in equal or less than 20 seconds with no UE support from 18.5 inch plinth to demonstrate improved LE functional strength and power for basic mobility and improved fall risk.  Baseline: 24 seconds with B UE on 18.5 inch plinth.  (07/10/21); 20 seconds with B UE on thighs from 18.5 inch plinth  (09/04/2021); 19 seconds with B UE on thighs from 18.5 inch plinth (10/04/2021); 22 seconds with B UE on thighs from 18.5 inch plinth (11/21/2021); 20 seconds with B UE on thighs from 18.5 inch plinth (01/01/2022); 19 seconds without use of B UE support (02/15/2022); 21 seconds with no UE support from 18.5 inch plinth (03/27/2022); 20.5 seconds no UE support and 18" surface (05/17/2022); 20 seconds with no UE support from 18.5 inch plinth (08/20/2022);  Goal status: in progress     5.  Patient be able to ascend and descend stairs at her home mod I with use of B handrails.  Baseline: able to ascend/descend 4 steps in the clinic with B UE support on handrails and supervision - reports currently needs assistance to descend steps at home. (07/10/21); able to ascend/descend 4 steps in the clinic with B UE support on handrails and supervision, leading up and down with left. able to perform with reversed LE leads (09/04/2021); able to ascend/descend 4 steps in the clinic with B UE support on handrails and supervision, step over step with heavy UE use on the way down (10/04/2021); able to ascend/descend 4 steps in the clinic with B UE support on handrails and supervision, step over step up and step to leading with R LE with heavy UE use on the way down (11/21/2021); ascend/descends 4 steps in the clinic with B UE support on handrails and supervision, step over step up and down with heavy UE use on the way down (01/01/2022); able to ascend/descend 4 steps in the clinic with B UE support on handrails and supervision, step over step up and down with heavy UE use on the way down. leads up with right foot step to gait (02/15/2022; 03/27/22); able to ascend/descend 4 steps in the clinic with B UE support on handrails and supervision, step over step up and down with moderately heavy UE use on the way down (08/20/2022);  Goal status: MET   6.  Patient will demonstrate ability to perform static balance in tandem stance for equal or greater  than 30 seconds on each side, to demonstrate improved balance and decreased fall risk.  Baseline: right foot front 10 seconds, left foot front 7 seconds (07/10/2021); right foot front 4 seconds, left foot front 6 seconds (09/04/2021); right foot front 9 seconds, left foot front 31 seconds (10/04/2021); right foot front 6.5 seconds, left foot front 8.5 seconds (11/21/2021); right foot front 8 seconds, left foot front 7 seconds (01/01/2022);  right foot front 9 seconds, left foot front 20 seconds (02/15/2022); right foot front 30 seconds, left foot front 45 seconds (03/27/2022); right foot front 16 seconds, left foot front 17 seconds  Goal status: MET 03/27/22     PLAN: PT FREQUENCY:  2x/week   PT DURATION: 12 weeks   PLANNED INTERVENTIONS: Therapeutic exercises, Therapeutic activity, Neuromuscular re-education, Balance training, Gait training, Patient/Family education, Joint mobilization, Orthotic/Fit training, DME instructions, Dry Needling, Electrical stimulation, Cryotherapy, Moist heat, Splintting, Taping, and Manual therapy   PLAN FOR NEXT SESSION: continue with moderate intensity functional strengthening and balance exercises as able, address back and right glute pain to allow return to functional strengthening and balance with better tolerance.     [name]. Ilsa Iha, PT, DPT 10/03/22, 3:57 PM  [facility] [address] [place], [place] [address] P: [phone] I F: [phone]

## 2022-10-08 ENCOUNTER — Ambulatory Visit: Payer: 59 | Admitting: Physical Therapy

## 2022-10-10 ENCOUNTER — Encounter: Payer: 59 | Admitting: Physical Therapy

## 2022-10-10 ENCOUNTER — Ambulatory Visit: Payer: 59 | Admitting: Podiatry

## 2022-10-15 ENCOUNTER — Ambulatory Visit: Payer: 59

## 2022-10-15 ENCOUNTER — Encounter: Payer: Self-pay | Admitting: Physical Therapy

## 2022-10-15 DIAGNOSIS — Z9181 History of falling: Secondary | ICD-10-CM

## 2022-10-15 DIAGNOSIS — R2689 Other abnormalities of gait and mobility: Secondary | ICD-10-CM | POA: Diagnosis not present

## 2022-10-15 DIAGNOSIS — M6281 Muscle weakness (generalized): Secondary | ICD-10-CM | POA: Diagnosis not present

## 2022-10-15 NOTE — Therapy (Signed)
OUTPATIENT PHYSICAL THERAPY TREATMENT   Patient Name: [name] MRN: [id number] DOB:Nov 23, 1975, 47 y.o., female Today's Date: 10/15/2022  PCP: Eunice Blase, PA-C REFERRING PROVIDER: Eunice Blase, PA-C  END OF SESSION:   PT End of Session - 10/15/22 1600     Visit Number 49    Number of Visits 63    Date for PT Re-Evaluation 11/12/22    Authorization Type [employer] MEDICARE/Medicaid reporting period from 05/22/2022    Progress Note Due on Visit 50    PT Start Time 1600    PT Stop Time 1643    PT Time Calculation (min) 43 min    Equipment Utilized During Treatment Gait belt    Activity Tolerance Patient tolerated treatment well    Behavior During Therapy WFL for tasks assessed/performed                  Past Medical History:  Diagnosis Date   Alcohol abuse    Anxiety    Anxiety    Anxiety and depression    Asthma    Bipolar disorder ([name])    Borderline personality disorder ([name])    Depression    Diabetes ([name])    Type 2    Dyslipidemia    Esophageal varices ([name])    Foot drop    right   [name] ([name])    GERD (gastroesophageal reflux disease)    Gout    History of drug abuse ([name])    Insomnia    Muscular dystrophies ([name])    Neuropathy    Pancreatitis    Recovering alcoholic ([name])    Past Surgical History:  Procedure Laterality Date   ESOPHAGOGASTRODUODENOSCOPY N/A 04/25/2022   Procedure: ESOPHAGOGASTRODUODENOSCOPY (EGD);  Surgeon: [name], MD;  Location: [facility] ENDOSCOPY;  Service: Gastroenterology;  Laterality: N/A;   HERNIA REPAIR     IR FLUORO GUIDE CV LINE RIGHT  12/30/2020   IR REMOVAL TUN CV CATH W/O FL  01/19/2021   IR US GUIDE VASC ACCESS RIGHT  12/30/2020   Patient Active Problem List   Diagnosis Date Noted   Gait abnormality 05/24/2022   Right hand weakness 05/24/2022   Frequent falls 02/12/2021   Leukocytosis    Aspiration pneumonia of both lower lobes due to gastric secretions ([name]) 01/04/2021    Pressure injury of skin 12/24/2020   Acute hepatic encephalopathy ([name]) 12/20/2020   Sepsis ([name]) 12/19/2020   Acute hyponatremia 12/18/2020   Alcohol use disorder, moderate, dependence ([name]) 12/18/2020   Hepatic steatosis 12/18/2020   Polysubstance abuse ([name]) 12/18/2020   Hyperglycemia due to type 2 diabetes mellitus ([name]) 12/18/2020   Hyperbilirubinemia 12/18/2020   Lactic acidosis 12/18/2020   Alcoholic liver disease ([name]) 12/18/2020   Bipolar 1 disorder, manic, moderate ([name]) 09/10/2018   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   Diabetes ([name]) 09/10/2018   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   Chronic gout of foot 09/29/2015   Muscular dystrophy ([name]) 09/29/2015   Type 2 diabetes mellitus without complication ([name]) 09/29/2015   Anxiety and depression 10/01/2012   Protein-calorie malnutrition, severe ([name]) 09/30/2012   Trichomonas infection 09/26/2012   Dehydration 09/26/2012   Hypokalemia 11/14/2011   Thrombocytopenia ([name]) 11/14/2011   Bipolar 2 disorder ([name]) 11/13/2011   Gout 11/13/2011     REFERRING DIAG: bilateral leg weakness  THERAPY DIAG:  Other abnormalities of gait and mobility  Muscle weakness (generalized)  History of falling  Rationale for Evaluation and Treatment Rehabilitation  PERTINENT HISTORY: Patient is a 47  y.o. female who presents to outpatient physical therapy with a referral for medical diagnosis bilateral leg weakness. This patient's chief complaints consist of difficulty with weakness and balance with history of frequent falling leading to the following functional deficits: difficulty with basic mobility including ambulation, stairs, transfers; difficulty with ADLs, IADLs. Currently requires assistance to navigate stairs, assistance with ADLs and  IADLs, and ambulates with RW when she previously did not need an assistive device. Relevant past medical history and comorbidities include muscular dystrophy (FSHD -  facioscapulohumeral muscular dystrophy), anxiety and depression, bipolar 1 disorder, borderline personality disorder, diabetes, GERD, gout, pancreatitis, alcoholic liver disease, tobacco abuse,  foot drop, neuropathy, recovering alcoholic, history of drug abuse, history of hernia repair.  Patient denies hx of cancer, stroke, seizures, lung problems, heart problems, unexplained weight loss, unexplained changes in bowel or bladder problems, unexplained stumbling or dropping things, osteoporosis, and spinal surgery.   PRECAUTIONS: Fall, not drink alcohol.  SUBJECTIVE: Patient arrives with rollator. She states she is having 3.5/10 pain in her low back today  PAIN: NPRS 5.5/10 right lower back to right glute.   OBJECTIVE  TODAY'S TREATMENT:  Manual therapy: to reduce pain and tissue tension, improve range of motion, neuromodulation, in order to promote improved ability to complete functional activities. PRONE - STM to bilateral lumbar paraspinals and right posterior hip musculature including gludes and deep hip rotators.  SUPINE -general stretching (FADIR, FABER, knee to chest, trunk rotation) x 10 minutes   Therapeutic exercise: to centralize symptoms and improve ROM, strength, muscular endurance, and activity tolerance required for successful completion of functional activities.   - sit <> stand 3x5 from 18.5 inch table.  - standing heel/toe raises 3 x 5 each direction, B UE support of rollator  - sled push two sets of 2x31ft (sled only, 75lb)  -step up 6" step with B hand rails 2 x 5 leading with each LE   Pt required multimodal cuing for proper technique and to facilitate improved neuromuscular control, strength, range of motion, and functional ability resulting in improved performance and form.   PATIENT EDUCATION:  Education details: form/technique with exercise. Recommendation to contact PCP about tachycardia, why tachycardia is not desirable.  Person educated: Patient Education  method: Explanation, demonstration, verbal cuing, tactile cuing. Education comprehension: verbalized understanding, demonstrated understanding, and needs further education      HOME EXERCISE PROGRAM: Access Code: [email or web address] URL: [email or web address] Date: 08/20/2022 Prepared by: Norton Blizzard  Exercises - Sit to Stand Without Arm Support  - 3 x weekly - 3 sets - 5 reps - Seated Toe Raise  - 3 x weekly - 3 sets - 6 reps - 1 second hold - Supine Lower Trunk Rotation  - 3 x weekly - 1 sets - 20 reps - 1-5 seconds hold - Prone Press Up  - 3 x weekly - 2 sets - 5 reps    ASSESSMENT:   CLINICAL IMPRESSION:  Continued to work on focal strength and endurance training. Patient able to complete increase in number of LE strengthening exercises. Patient would benefit from continued management of limiting condition by skilled physical therapist to address remaining impairments and functional limitations to work towards stated goals and return to PLOF or maximal functional independence.   OBJECTIVE IMPAIRMENTS Abnormal gait, decreased activity tolerance, decreased balance, decreased coordination, decreased endurance, decreased mobility, difficulty walking, decreased ROM, decreased strength, impaired perceived functional ability, increased muscle spasms, impaired tone, impaired UE functional use, improper body mechanics, postural dysfunction, and pain.  ACTIVITY LIMITATIONS cleaning, community activity, driving, meal prep, laundry, and shopping.    PERSONAL FACTORS Behavior pattern, Fitness, Past/current experiences, Time since onset of injury/illness/exacerbation, Transportation, and 3+ comorbidities:    muscular dystrophy (FSHD - facioscapulohumeral muscular dystrophy), anxiety and depression, bipolar 1 disorder, borderline personality disorder, diabetes, GERD, gout, pancreatitis, alcoholic liver disease, tobacco abuse,  foot drop, neuropathy, recovering alcoholic, history of drug abuse,  history of hernia repair are also affecting patient's functional outcome.      REHAB POTENTIAL: Good   CLINICAL DECISION MAKING: Evolving/moderate complexity   EVALUATION COMPLEXITY: Moderate     GOALS: Goals reviewed with patient? No   SHORT TERM GOALS: Target date: 07/24/2021   Patient will be independent with initial home exercise program for self-management of symptoms. Baseline: Initial HEP to be provided at visit 2 as appropriate (07/10/21); Goal status: Met     LONG TERM GOALS: Target date: 10/02/2021; updated to 12/27/2021 for all unmet goals on 10/04/2021; updated to 03/26/2022 for all unmet goals on 01/01/2022; updated to 06/19/2022 for all unmet goals on 03/27/2022; updated to 08/14/2022 for all unmet goals on 05/22/2022, updated to 11/12/2022 for all unmet goals on 08/20/2022.    Patient will be independent with a long-term home exercise program for self-management of symptoms.  Baseline: Initial HEP to be provided at visit 2 as appropriate (07/10/21); participating as tolerated, recently too much pain to participate (09/04/2021); participating regularly (10/04/2021); participating as able (11/21/2021); not participating regularly over last couple of weeks (01/01/2022); no participating regularly since last progress note (02/15/2022); improving participation (03/27/2022); has not last two weeks due to recent fall (05/17/22); has not been participating (08/20/2022);  Goal status: Ongoing   2.  Patient will demonstrate improved FOTO score by 10 points from baseline to demonstrate improvement in overall condition and self-reported functional ability.  Baseline: to be measured visit 2 as appropriate (07/10/21); 47 at visit # 2 (07/17/2021); 50 at visit # 10 (09/04/2021); 53 at visit #15 (10/04/2021); 50 at visit # 20 (11/21/2021); 52 at visit # 26 (01/01/2022); 50 at visit #30 (02/15/2022); 54 at visit # 34 (03/27/22);  53 (05/17/22); 51 at visit #44 (08/20/2022); Goal status: In-Progress   3.  Patient will  ambulate equal or greater than 1000 feet during 6 Min Walk Test using [facility] or less restrictive assistive device to improve community ambulation.  Baseline: To be tested visit 2 as appropriate (07/10/21); 310 feet with RW (07/17/2021); 556 feet with rollator (09/04/2021); 600 feet with rollator (10/04/2021); 300 feet with rollator - had to stop test at 3 min due to intolerable right glute pain (11/21/2021); 420 feet with rollator and steppage gait, R > L. Stopped at 5 min due to feeling of instability in low back to mid thoracic spine (01/01/2022); 573 feet with rollator and steppage gait, R > L. Occasional standing breaks. Low back pain increased to 3.5/10 (02/15/2022);  381 feet with rollator and steppage gait, R > L. Stopped at 4:30 min due to pain in low back and right glute (03/27/2022); 124' in 1 minute 22 sec. Had to discontinue due to R back/hip pain (05/17/2022); 200 feet with rollator, discontinued due to low back/glute pain (08/20/2022);  Goal status: In-Progress   4.  Patient will complete 5 Time Sit To Stand test in equal or less than 20 seconds with no UE support from 18.5 inch plinth to demonstrate improved LE functional strength and power for basic mobility and improved fall risk.  Baseline: 24 seconds with B UE  on 18.5 inch plinth.  (07/10/21); 20 seconds with B UE on thighs from 18.5 inch plinth (09/04/2021); 19 seconds with B UE on thighs from 18.5 inch plinth (10/04/2021); 22 seconds with B UE on thighs from 18.5 inch plinth (11/21/2021); 20 seconds with B UE on thighs from 18.5 inch plinth (01/01/2022); 19 seconds without use of B UE support (02/15/2022); 21 seconds with no UE support from 18.5 inch plinth (03/27/2022); 20.5 seconds no UE support and 18" surface (05/17/2022); 20 seconds with no UE support from 18.5 inch plinth (08/20/2022);  Goal status: in progress     5.  Patient be able to ascend and descend stairs at her home mod I with use of B handrails.  Baseline: able to ascend/descend 4 steps in  the clinic with B UE support on handrails and supervision - reports currently needs assistance to descend steps at home. (07/10/21); able to ascend/descend 4 steps in the clinic with B UE support on handrails and supervision, leading up and down with left. able to perform with reversed LE leads (09/04/2021); able to ascend/descend 4 steps in the clinic with B UE support on handrails and supervision, step over step with heavy UE use on the way down (10/04/2021); able to ascend/descend 4 steps in the clinic with B UE support on handrails and supervision, step over step up and step to leading with R LE with heavy UE use on the way down (11/21/2021); ascend/descends 4 steps in the clinic with B UE support on handrails and supervision, step over step up and down with heavy UE use on the way down (01/01/2022); able to ascend/descend 4 steps in the clinic with B UE support on handrails and supervision, step over step up and down with heavy UE use on the way down. leads up with right foot step to gait (02/15/2022; 03/27/22); able to ascend/descend 4 steps in the clinic with B UE support on handrails and supervision, step over step up and down with moderately heavy UE use on the way down (08/20/2022);  Goal status: MET   6.  Patient will demonstrate ability to perform static balance in tandem stance for equal or greater than 30 seconds on each side, to demonstrate improved balance and decreased fall risk.  Baseline: right foot front 10 seconds, left foot front 7 seconds (07/10/2021); right foot front 4 seconds, left foot front 6 seconds (09/04/2021); right foot front 9 seconds, left foot front 31 seconds (10/04/2021); right foot front 6.5 seconds, left foot front 8.5 seconds (11/21/2021); right foot front 8 seconds, left foot front 7 seconds (01/01/2022);  right foot front 9 seconds, left foot front 20 seconds (02/15/2022); right foot front 30 seconds, left foot front 45 seconds (03/27/2022); right foot front 16 seconds, left foot  front 17 seconds  Goal status: MET 03/27/22     PLAN: PT FREQUENCY: 2x/week   PT DURATION: 12 weeks   PLANNED INTERVENTIONS: Therapeutic exercises, Therapeutic activity, Neuromuscular re-education, Balance training, Gait training, Patient/Family education, Joint mobilization, Orthotic/Fit training, DME instructions, Dry Needling, Electrical stimulation, Cryotherapy, Moist heat, Splintting, Taping, and Manual therapy   PLAN FOR NEXT SESSION: continue with moderate intensity functional strengthening and balance exercises as able, address back and right glute pain to allow return to functional strengthening and balance with better tolerance.     Olumide Dolinger A. Dan Humphreys, PT, DPT 10/15/22, 4:01 PM  [facility] [address] [place], [place] [address] P: [phone] I F: [phone]

## 2022-10-17 ENCOUNTER — Encounter: Payer: Self-pay | Admitting: Physical Therapy

## 2022-10-17 ENCOUNTER — Ambulatory Visit: Payer: 59 | Admitting: Physical Therapy

## 2022-10-17 DIAGNOSIS — M6281 Muscle weakness (generalized): Secondary | ICD-10-CM | POA: Diagnosis not present

## 2022-10-17 DIAGNOSIS — Z9181 History of falling: Secondary | ICD-10-CM | POA: Diagnosis not present

## 2022-10-17 DIAGNOSIS — R2689 Other abnormalities of gait and mobility: Secondary | ICD-10-CM

## 2022-10-17 NOTE — Therapy (Signed)
OUTPATIENT PHYSICAL THERAPY TREATMENT / PROGRESS NOTE / RE-CERTIFICATION Dates of reporting from 05/22/2022 to 10/17/2022  Patient Name: [name] MRN: [id number] DOB:01/25/1976, 47 y.o., female Today's Date: 10/17/2022  PCP: Eunice Blase, PA-C REFERRING PROVIDER: Eunice Blase, PA-C  END OF SESSION:   PT End of Session - 10/17/22 1516     Visit Number 50    Number of Visits 73    Date for PT Re-Evaluation 01/09/23    Authorization Type [employer] MEDICARE/Medicaid reporting period from 05/22/2022    Progress Note Due on Visit 50    PT Start Time 1516    PT Stop Time 1604    PT Time Calculation (min) 48 min    Equipment Utilized During Treatment Gait belt    Activity Tolerance Patient tolerated treatment well    Behavior During Therapy WFL for tasks assessed/performed              Past Medical History:  Diagnosis Date   Alcohol abuse    Anxiety    Anxiety    Anxiety and depression    Asthma    Bipolar disorder ([name])    Borderline personality disorder ([name])    Depression    Diabetes ([name])    Type 2    Dyslipidemia    Esophageal varices ([name])    Foot drop    right   [name] ([name])    GERD (gastroesophageal reflux disease)    Gout    History of drug abuse ([name])    Insomnia    Muscular dystrophies ([name])    Neuropathy    Pancreatitis    Recovering alcoholic ([name])    Past Surgical History:  Procedure Laterality Date   ESOPHAGOGASTRODUODENOSCOPY N/A 04/25/2022   Procedure: ESOPHAGOGASTRODUODENOSCOPY (EGD);  Surgeon: [name], MD;  Location: [facility] ENDOSCOPY;  Service: Gastroenterology;  Laterality: N/A;   HERNIA REPAIR     IR FLUORO GUIDE CV LINE RIGHT  12/30/2020   IR REMOVAL TUN CV CATH W/O FL  01/19/2021   IR US GUIDE VASC ACCESS RIGHT  12/30/2020   Patient Active Problem List   Diagnosis Date Noted   Gait abnormality 05/24/2022   Right hand weakness 05/24/2022   Frequent falls 02/12/2021   Leukocytosis    Aspiration  pneumonia of both lower lobes due to gastric secretions ([name]) 01/04/2021   Pressure injury of skin 12/24/2020   Acute hepatic encephalopathy ([name]) 12/20/2020   Sepsis ([name]) 12/19/2020   Acute hyponatremia 12/18/2020   Alcohol use disorder, moderate, dependence ([name]) 12/18/2020   Hepatic steatosis 12/18/2020   Polysubstance abuse ([name]) 12/18/2020   Hyperglycemia due to type 2 diabetes mellitus ([name]) 12/18/2020   Hyperbilirubinemia 12/18/2020   Lactic acidosis 12/18/2020   Alcoholic liver disease ([name]) 12/18/2020   Bipolar 1 disorder, manic, moderate ([name]) 09/10/2018   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   Diabetes ([name]) 09/10/2018   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   Chronic gout of foot 09/29/2015   Muscular dystrophy ([name]) 09/29/2015   Type 2 diabetes mellitus without complication ([name]) 09/29/2015   Anxiety and depression 10/01/2012   Protein-calorie malnutrition, severe ([name]) 09/30/2012   Trichomonas infection 09/26/2012   Dehydration 09/26/2012   Hypokalemia 11/14/2011   Thrombocytopenia ([name]) 11/14/2011   Bipolar 2 disorder ([name]) 11/13/2011   Gout 11/13/2011     REFERRING DIAG: bilateral leg weakness  THERAPY DIAG:  Other abnormalities of gait and mobility  Muscle weakness (generalized)  History of falling  Rationale for Evaluation and Treatment Rehabilitation  PERTINENT HISTORY: Patient is a 47 y.o. female who presents to outpatient physical therapy with a referral for medical diagnosis bilateral leg weakness. This patient's chief complaints consist of difficulty with weakness and balance with history of frequent falling leading to the following functional deficits: difficulty with basic mobility including ambulation, stairs, transfers; difficulty with ADLs, IADLs. Currently requires assistance to navigate stairs, assistance with ADLs and  IADLs, and ambulates with RW when she previously did not need an assistive device. Relevant past medical  history and comorbidities include muscular dystrophy (FSHD - facioscapulohumeral muscular dystrophy), anxiety and depression, bipolar 1 disorder, borderline personality disorder, diabetes, GERD, gout, pancreatitis, alcoholic liver disease, tobacco abuse,  foot drop, neuropathy, recovering alcoholic, history of drug abuse, history of hernia repair.  Patient denies hx of cancer, stroke, seizures, lung problems, heart problems, unexplained weight loss, unexplained changes in bowel or bladder problems, unexplained stumbling or dropping things, osteoporosis, and spinal surgery.   PRECAUTIONS: Fall, not drink alcohol.  SUBJECTIVE: Patient arrives with rollator. She states she is feeling well today but she still has back pain. She states that whatever was done 2 visits ago really helped her pain and the sled exercise "kicked her ass." She states she has lost 18 lbs in 4 months. She states she has been doing her HEP a lot more at home.   PAIN: NPRS 2.5/10 right lower back to right glute.   OBJECTIVE   SELF-REPORTED FUNCTION FOTO score: 58/100 (NOC-Neuromuscular disorder questionnaire)   FUNCTIONAL/BALANCE TESTS: 6 Minute Walk Test: 500 feet with rollator and steppage gait, R > L. Stopped at 5:37 min due to pain in low back and right glute limiting distance.    Stairs: able to ascend/descend 4 steps in the clinic with B UE support on handrails and supervision, step over step up and down with UE use on the way down.    Five Time Sit to Stand (5TSTS): 16 seconds with no UE support from 18.5 inch plinth.    Static balance (best of 2 trials): tandem stance, eyes open: right foot front 26 seconds, left foot front 43 seconds.     TODAY'S TREATMENT:  Therapeutic exercise: to centralize symptoms and improve ROM, strength, muscular endurance, and activity tolerance required for successful completion of functional activities.  - measurements to assess progress (see above).   Manual therapy: to reduce pain  and tissue tension, improve range of motion, neuromodulation, in order to promote improved ability to complete functional activities. PRONE - STM to bilateral lumbar paraspinals and right posterior hip musculature including gludes and deep hip rotators.    Modality: (unbilled) Dry needling performed to low back and right piriformis to decrease pain and spasms along patient's low back and right glute region with patient in prone utilizing 3 dry needle(s) .30mm x 75mm with 1 stick each level  each side at lumbar multifidi at L4 and L5  and 2 dry needle(s) .30mm x with 1 stick at right lumbar multifidi at L5 and 1 stick at right piriformis. Patient educated about the risks and benefits from therapy and verbally consents to treatment and draped appropriately. Dry needling performed by [name]. Ilsa Iha PT, DPT who is certified in this technique.  Pt required multimodal cuing for proper technique and to facilitate improved neuromuscular control, strength, range of motion, and functional ability resulting in improved performance and form.   PATIENT EDUCATION:  Education details: Exercise purpose/form. Self management techniques. Education on diagnosis, prognosis, POC, anatomy and physiology of current condition. Dry  needling.  Person educated: Patient Education method: Explanation, demonstration, verbal cuing, tactile cuing. Education comprehension: verbalized understanding, demonstrated understanding, and needs further education      HOME EXERCISE PROGRAM: Access Code: [email or web address] URL: [email or web address] Date: 08/20/2022 Prepared by: Norton Blizzard  Exercises - Sit to Stand Without Arm Support  - 3 x weekly - 3 sets - 5 reps - Seated Toe Raise  - 3 x weekly - 3 sets - 6 reps - 1 second hold - Supine Lower Trunk Rotation  - 3 x weekly - 1 sets - 20 reps - 1-5 seconds hold - Prone Press Up  - 3 x weekly - 2 sets - 5 reps    ASSESSMENT:   CLINICAL IMPRESSION:  Patient has  attended 50 physical therapy sessions since starting current episode of care on 07/10/2021. Since her last progress note patient has demonstrated better attendance and participation in HEP, resulting in progress towards all goals. She also has reported less falls. Patient has not yet reached her potential for improvement and has a chronic degenerative neurologic condition. She does have chronic pain that PT is able to help manage to improve her function.  She would benefit from continued PT at a rate of 2x per week as she is able to get to PT. She does have significant psychosocial barriers to attendance and participation that negatively affect the impact of PT. Patient would benefit from continued management of limiting condition by skilled physical therapist to address remaining impairments and functional limitations to work towards stated goals and return to PLOF or maximal functional independence.   OBJECTIVE IMPAIRMENTS Abnormal gait, decreased activity tolerance, decreased balance, decreased coordination, decreased endurance, decreased mobility, difficulty walking, decreased ROM, decreased strength, impaired perceived functional ability, increased muscle spasms, impaired tone, impaired UE functional use, improper body mechanics, postural dysfunction, and pain.    ACTIVITY LIMITATIONS cleaning, community activity, driving, meal prep, laundry, and shopping.    PERSONAL FACTORS Behavior pattern, Fitness, Past/current experiences, Time since onset of injury/illness/exacerbation, Transportation, and 3+ comorbidities:    muscular dystrophy (FSHD - facioscapulohumeral muscular dystrophy), anxiety and depression, bipolar 1 disorder, borderline personality disorder, diabetes, GERD, gout, pancreatitis, alcoholic liver disease, tobacco abuse,  foot drop, neuropathy, recovering alcoholic, history of drug abuse, history of hernia repair are also affecting patient's functional outcome.      REHAB POTENTIAL: Good    CLINICAL DECISION MAKING: Evolving/moderate complexity   EVALUATION COMPLEXITY: Moderate     GOALS: Goals reviewed with patient? No   SHORT TERM GOALS: Target date: 07/24/2021   Patient will be independent with initial home exercise program for self-management of symptoms. Baseline: Initial HEP to be provided at visit 2 as appropriate (07/10/21); Goal status: Met     LONG TERM GOALS: Target date: 10/02/2021; updated to 12/27/2021 for all unmet goals on 10/04/2021; updated to 03/26/2022 for all unmet goals on 01/01/2022; updated to 06/19/2022 for all unmet goals on 03/27/2022; updated to 08/14/2022 for all unmet goals on 05/22/2022, updated to 11/12/2022 for all unmet goals on 08/20/2022, updated to 01/09/2023 for all unmet goals on 10/17/2022.    Patient will be independent with a long-term home exercise program for self-management of symptoms.  Baseline: Initial HEP to be provided at visit 2 as appropriate (07/10/21); participating as tolerated, recently too much pain to participate (09/04/2021); participating regularly (10/04/2021); participating as able (11/21/2021); not participating regularly over last couple of weeks (01/01/2022); no participating regularly since last progress note (02/15/2022); improving participation (03/27/2022); has not last two weeks  due to recent fall (05/17/22); has not been participating (08/20/2022); patient is participating in HEP 2x a week (10/17/2022);  Goal status:  In-progress   2.  Patient will demonstrate improved FOTO score by 10 points from baseline to demonstrate improvement in overall condition and self-reported functional ability.  Baseline: to be measured visit 2 as appropriate (07/10/21); 46 at visit # 2 (07/17/2021); 50 at visit # 10 (09/04/2021); 53 at visit #15 (10/04/2021); 50 at visit # 20 (11/21/2021); 52 at visit # 26 (01/01/2022); 50 at visit #30 (02/15/2022); 54 at visit # 34 (03/27/22);  53 (05/17/22); 51 at visit #44 (08/20/2022); 58 at visit #50 (10/17/2022);  Goal status:  In-Progress   3.  Patient will ambulate equal or greater than 1000 feet during 6 Min Walk Test using [facility] or less restrictive assistive device to improve community ambulation.  Baseline: To be tested visit 2 as appropriate (07/10/21); 310 feet with RW (07/17/2021); 556 feet with rollator (09/04/2021); 600 feet with rollator (10/04/2021); 300 feet with rollator - had to stop test at 3 min due to intolerable right glute pain (11/21/2021); 420 feet with rollator and steppage gait, R > L. Stopped at 5 min due to feeling of instability in low back to mid thoracic spine (01/01/2022); 573 feet with rollator and steppage gait, R > L. Occasional standing breaks. Low back pain increased to 3.5/10 (02/15/2022);  381 feet with rollator and steppage gait, R > L. Stopped at 4:30 min due to pain in low back and right glute (03/27/2022); 124' in 1 minute 22 sec. Had to discontinue due to R back/hip pain (05/17/2022); 200 feet with rollator, discontinued due to low back/glute pain (08/20/2022); 500 feet with rollator (10/17/2022);  Goal status: In-Progress   4.  Patient will complete 5 Time Sit To Stand test in equal or less than 20 seconds with no UE support from 18.5 inch plinth to demonstrate improved LE functional strength and power for basic mobility and improved fall risk.  Baseline: 24 seconds with B UE on 18.5 inch plinth.  (07/10/21); 20 seconds with B UE on thighs from 18.5 inch plinth (09/04/2021); 19 seconds with B UE on thighs from 18.5 inch plinth (10/04/2021); 22 seconds with B UE on thighs from 18.5 inch plinth (11/21/2021); 20 seconds with B UE on thighs from 18.5 inch plinth (01/01/2022); 19 seconds without use of B UE support (02/15/2022); 21 seconds with no UE support from 18.5 inch plinth (03/27/2022); 20.5 seconds no UE support and 18" surface (05/17/2022); 20 seconds with no UE support from 18.5 inch plinth (08/20/2022);  Goal status: in progress     5.  Patient be able to ascend and descend stairs at her home mod I with  use of B handrails.  Baseline: able to ascend/descend 4 steps in the clinic with B UE support on handrails and supervision - reports currently needs assistance to descend steps at home. (07/10/21); able to ascend/descend 4 steps in the clinic with B UE support on handrails and supervision, leading up and down with left. able to perform with reversed LE leads (09/04/2021); able to ascend/descend 4 steps in the clinic with B UE support on handrails and supervision, step over step with heavy UE use on the way down (10/04/2021); able to ascend/descend 4 steps in the clinic with B UE support on handrails and supervision, step over step up and step to leading with R LE with heavy UE use on the way down (11/21/2021); ascend/descends 4 steps in the clinic  with B UE support on handrails and supervision, step over step up and down with heavy UE use on the way down (01/01/2022); able to ascend/descend 4 steps in the clinic with B UE support on handrails and supervision, step over step up and down with heavy UE use on the way down. leads up with right foot step to gait (02/15/2022; 03/27/22); able to ascend/descend 4 steps in the clinic with B UE support on handrails and supervision, step over step up and down with moderately heavy UE use on the way down (08/20/2022); able to ascend/descend 4 steps in the clinic with B UE support on handrails and supervision, step over step up and down with UE use on the way down (10/17/2022);  Goal status: MET   6.  Patient will demonstrate ability to perform static balance in tandem stance for equal or greater than 30 seconds on each side, to demonstrate improved balance and decreased fall risk.  Baseline: right foot front 10 seconds, left foot front 7 seconds (07/10/2021); right foot front 4 seconds, left foot front 6 seconds (09/04/2021); right foot front 9 seconds, left foot front 31 seconds (10/04/2021); right foot front 6.5 seconds, left foot front 8.5 seconds (11/21/2021); right foot front 8  seconds, left foot front 7 seconds (01/01/2022);  right foot front 9 seconds, left foot front 20 seconds (02/15/2022); right foot front 30 seconds, left foot front 45 seconds (03/27/2022); right foot front 16 seconds, left foot front 17 seconds (08/20/2022); right foot front 26 seconds, left foot front 43 seconds (10/17/2022);  Goal status: MET 03/27/22     PLAN: PT FREQUENCY: 2x/week   PT DURATION: 12 weeks   PLANNED INTERVENTIONS: Therapeutic exercises, Therapeutic activity, Neuromuscular re-education, Balance training, Gait training, Patient/Family education, Joint mobilization, Orthotic/Fit training, DME instructions, Dry Needling, Electrical stimulation, Cryotherapy, Moist heat, Splintting, Taping, and Manual therapy   PLAN FOR NEXT SESSION: continue with moderate intensity functional strengthening and balance exercises as able, address back and right glute pain to allow return to functional strengthening and balance with better tolerance.     [name]. Ilsa Iha, PT, DPT 10/17/22, 4:12 PM  [facility] [address] [place], [place] [address] P: [phone] I F: [phone]

## 2022-10-24 ENCOUNTER — Ambulatory Visit (INDEPENDENT_AMBULATORY_CARE_PROVIDER_SITE_OTHER): Payer: 59 | Admitting: Neurology

## 2022-10-24 ENCOUNTER — Encounter: Payer: 59 | Admitting: Neurology

## 2022-10-24 ENCOUNTER — Ambulatory Visit: Payer: 59 | Admitting: Neurology

## 2022-10-24 ENCOUNTER — Ambulatory Visit: Payer: 59 | Admitting: Physical Therapy

## 2022-10-24 VITALS — BP 127/87 | HR 109 | Ht 67.0 in | Wt 240.0 lb

## 2022-10-24 DIAGNOSIS — G7102 Facioscapulohumeral muscular dystrophy: Secondary | ICD-10-CM | POA: Diagnosis not present

## 2022-10-24 DIAGNOSIS — R269 Unspecified abnormalities of gait and mobility: Secondary | ICD-10-CM

## 2022-10-24 DIAGNOSIS — R29898 Other symptoms and signs involving the musculoskeletal system: Secondary | ICD-10-CM | POA: Diagnosis not present

## 2022-10-24 NOTE — Progress Notes (Signed)
No chief complaint on file.     ASSESSMENT AND PLAN  [name] is a 47 y.o. 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   MEDICAL HISTORY:  [name] is a 47 year old female, seen in request by her primary care PA O'Buch, Edgardo Roys, for evaluation of muscle weakness, gait abnormality, new onset right hand weakness, she is accompanied by her, Maternal Cousin Bosie Clos at visit on May 24, 2022   I reviewed and summarized the referring note. PMHX. 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  She lives with her cousin, ambulate with a walker for few years now, significant psychiatric history, on polypharmacy treatment, also personally reviewed multiple hospital admission in November 2022,  Alcohol abuse, polysubstance abuse, was admitted for frequent fall, was not able to get up, suffered aspiration pneumonia, alcoholic cirrhosis, encephalopathic, ascites, required paracentesis,  Since that hospital discharge, she stayed with her cousin Dawn at home, who has helped her rehabilitation, not able to ambulate with walker  Since October 2023,  without clear triggers, she noticed difficulty using her right hand, mostly involving right third through fifth fingers, difficult to make a tight grip, dropping [place] fry out of her hands, also some numbness at the lateral 3 fingers, complains of worsening neck pain, urinary incontinence sometimes cannot make it to the bathroom, she has stopped using alcohol, also smoking, prior to that, she can drink 5 bottles of wine in 12 hours, smoked up to 2 packs a day, previous multiple UDS was positive for cocaine, amphetamine, marijuana, benzo,  UPDATE August 7th 2024: She return for electrodiagnostic study today which showed evidence of bilateral carpal tunnel syndromes, mild to moderate left worse than right  MRI of cervical spine showed mild degenerative changes no significant canal foraminal narrowing  She has profound distal weakness gait abnormality, slowly worsening functional status, agreed for MDA clinic refer Lab evaluation 2024 normal CPK, elevated glucose 264, normal CBC with exception of low platelet 133 PHYSICAL EXAM:  127/87, heart rate of 109   Gen: NAD, conversant, well nourised, well groomed                     Cardiovascular: Regular rate rhythm, no peripheral edema, warm, nontender. Eyes: Conjunctivae clear without exudates or hemorrhage Neck: Supple, no carotid bruits. Pulmonary: Clear to auscultation bilaterally   NEUROLOGICAL EXAM:  MENTAL STATUS: Speech/cognition: Awake, alert, oriented to history taking and casual conversation CRANIAL NERVES: CN II: Visual fields are full to confrontation. Pupils are round equal and briskly reactive to light. CN III, IV, VI: extraocular movement are normal. No ptosis. CN V: Facial sensation is intact to light touch CN VII: Face is symmetric with moderate to severe bilateral eye closure, cheek puff weakness, CN VIII: Hearing is normal to causal  conversation. CN IX, X: Phonation is normal. CN XI: Head turning and shoulder shrug are  intact  MOTOR:, Mild neck flexion weakness, mild atrophy of left biceps,  atrophy of distal leg muscle  UE Shoulder Abduction Shoulder External Rotation Elbow Flexion Elbow  Extension Wrist Flexion Wrist Extension Grip Finger  Abduction Finger Flexion /Extension  R 5 5 5- 5- 5 5 5 5  5/5  L 4 4 5- 5- 5 5 5 5  5/5   LE Hip Flexion Knee flexion Knee extension Ankle Dorsiflexion Eversion Ankle plantar Flexion Inversion  R 5 5 5 1 3 4 4   L 5 5 5 1 3 4 4     REFLEXES: Reflexes are absent at bilateral upper and lower extremity. Plantar responses are flexor.  SENSORY: Length-dependent decreased to light touch, pinprick to ankle level  COORDINATION: There is no trunk or limb dysmetria noted.  GAIT/STANCE: Need push-up to get up from seated position, very unsteady, bilateral foot drop, also complicated by her central obesity  REVIEW OF SYSTEMS:  Full 14 system review of systems performed and notable only for as above All other review of systems were negative.   ALLERGIES: Allergies  Allergen Reactions   Erythromycin Anaphylaxis   Sulfa Antibiotics Anaphylaxis   Tylenol [Acetaminophen] Other (See Comments)    Pancreatitis and liver dysfunction, not supposed to take med   Nsaids Other (See Comments)    Causes GI problems, very bad reaction-per patient.    Gabapentin     Mood changes     HOME MEDICATIONS: Current Outpatient Medications  Medication Sig Dispense Refill   Accu-Chek Softclix Lancets lancets SMARTSIG:1 Topical Daily     albuterol (VENTOLIN HFA) 108 (90 Base) MCG/ACT inhaler Inhale 2 puffs into the lungs every 6 (six) hours as needed for wheezing. 6.7 g 1   amitriptyline (ELAVIL) 25 MG tablet Take 25 mg by mouth at bedtime.     atorvastatin (LIPITOR) 40 MG tablet Take 1 tablet (40 mg total) by mouth daily. 30 tablet 2   B-D UF III MINI PEN NEEDLES 31G X 5 MM MISC Inject into the skin.     Continuous Glucose Sensor (DEXCOM G7 SENSOR) MISC USE EVERY 10 DAYS AS DIRECTED      dicyclomine (BENTYL) 20 MG tablet Take 20 mg by mouth 3 (three) times daily.     FLUoxetine (PROZAC) 40 MG capsule Take 2 capsules (80 mg total) by mouth daily. 30 capsule 1   fluticasone (FLONASE) 50 MCG/ACT nasal spray Place 1 spray into both nostrils daily. 9.9 mL 1   hydrOXYzine (ATARAX) 10 MG tablet Take 10 mg by mouth 2 (two) times daily as needed.     insulin lispro (HUMALOG) 100 UNIT/ML injection Inject 0.12 mLs (12 Units total) into the skin 3 (three) times daily with meals. 10 mL 3   lactulose (CHRONULAC) 10 GM/15ML solution Take 30 mLs (20 g total) by mouth 2 (two) times daily as needed for mild constipation. 236 mL 1   LANTUS SOLOSTAR 100 UNIT/ML Solostar Pen Inject 27 mLs into the skin 2 (two) times daily.     lisinopril (ZESTRIL) 10 MG tablet Take 10 mg by mouth daily.     magnesium oxide (MAG-OX) 400 (240 Mg) MG tablet Take 1 tablet (400 mg total) by mouth 2 (two) times daily.     metFORMIN (GLUCOPHAGE-XR) 750 MG 24 hr tablet Take 750 mg by mouth 2 (two) times daily.     methocarbamol (ROBAXIN) 500 MG tablet Take 500 mg by  mouth 3 (three) times daily as needed.     Mirabegron (MYRBETRIQ PO) Take by mouth.     mirtazapine (REMERON) 15 MG tablet Take 15 mg by mouth at bedtime.     Multiple Vitamin (MULTIVITAMIN WITH MINERALS) TABS tablet Take 1 tablet by mouth daily. 30 tablet 1   OLANZapine (ZYPREXA) 5 MG tablet Take 1 tablet (5 mg total) by mouth at bedtime. 30 tablet 0   OZEMPIC, 0.25 OR 0.5 MG/DOSE, 2 MG/3ML SOPN once a week.     pantoprazole (PROTONIX) 40 MG tablet Take 1 tablet (40 mg total) by mouth daily. 30 tablet 2   phentermine (ADIPEX-P) 37.5 MG tablet Take 37.5 mg by mouth daily.     pregabalin (LYRICA) 150 MG capsule Take 1 capsule (150 mg total) by mouth 3 (three) times daily. 30 capsule 0   promethazine (PHENERGAN) 25 MG tablet Take 25 mg by mouth every 6 (six) hours as needed.     VRAYLAR 3 MG capsule Take 3 mg by mouth daily.     No current  facility-administered medications for this visit.    PAST MEDICAL HISTORY: Past Medical History:  Diagnosis Date   Alcohol abuse    Anxiety    Anxiety    Anxiety and depression    Asthma    Bipolar disorder ([name])    Borderline personality disorder ([name])    Depression    Diabetes ([name])    Type 2    Dyslipidemia    Esophageal varices ([name])    Foot drop    right   [name] ([name])    GERD (gastroesophageal reflux disease)    Gout    History of drug abuse ([name])    Insomnia    Muscular dystrophies ([name])    Neuropathy    Pancreatitis    Recovering alcoholic ([name])     PAST SURGICAL HISTORY: Past Surgical History:  Procedure Laterality Date   ESOPHAGOGASTRODUODENOSCOPY N/A 04/25/2022   Procedure: ESOPHAGOGASTRODUODENOSCOPY (EGD);  Surgeon: [name], MD;  Location: [facility] ENDOSCOPY;  Service: Gastroenterology;  Laterality: N/A;   HERNIA REPAIR     IR FLUORO GUIDE CV LINE RIGHT  12/30/2020   IR REMOVAL TUN CV CATH W/O FL  01/19/2021   IR US GUIDE VASC ACCESS RIGHT  12/30/2020    FAMILY HISTORY: Family History  Problem Relation Age of Onset   Heart disease Mother    Heart attack Mother    Hyperlipidemia Maternal Grandmother    Diabetes Maternal Grandmother    Hypertension Maternal Grandmother    Hyperlipidemia Maternal Grandfather    Heart disease Maternal Grandfather    Heart attack Maternal Grandfather    Diabetes Maternal Grandfather    Lung cancer Maternal Grandfather    Hypertension Maternal Grandfather    Other Paternal Grandmother        Thyroid problems    Heart disease Paternal Grandfather    Stroke Paternal Grandfather    Hyperlipidemia Paternal Grandfather    Heart attack Paternal Grandfather    Hypertension Paternal Grandfather     SOCIAL HISTORY: Social History   Socioeconomic History   Marital status: Divorced    Spouse name: Not on file   Number of children: Not on file   Years of education: Not on file    Highest education level: Not on file  Occupational History   Occupation: disabled  Tobacco Use   Smoking status: Former    Current packs/day: 0.00    Types: Cigarettes  Quit date: 09/2020    Years since quitting: 2.1   Smokeless tobacco: Never  Substance and Sexual Activity   Alcohol use: Not Currently   Drug use: Yes    Types: IV, Cocaine   Sexual activity: Yes    Birth control/protection: None  Other Topics Concern   Not on file  Social History Narrative   Not on file   Social Determinants of Health   Financial Resource Strain: Not on file  Food Insecurity: Not on file  Transportation Needs: Not on file  Physical Activity: Not on file  Stress: Not on file  Social Connections: Not on file  Intimate Partner Violence: Not on file      Levert Feinstein, M.D. Ph.D.  [facility] [address] [place], [place] [address] Ph: [phone] Fax: [phone]  CC:  Eunice Blase, PA-C [address] [place],  [place] [address]  Eunice Blase, PA-C

## 2022-10-24 NOTE — Procedures (Signed)
Full Name: [name] Gender: Female MRN #: [id number] Date of Birth: 06/08/1975    Visit Date: 10/24/2022 07:24 Age: 47 Years Examining Physician: Dr. Levert Feinstein Referring Physician: Dr. Levert Feinstein Height: 5 feet 7 inch History: Patient with progressive facial and proximal arm distal leg weakness, strong family history of facioscapulohumeral muscular dystrophy, presenting with bilateral hands paresthesia  Summary of the test: Nerve conduction study: Bilateral ulnar sensory responses were normal.  Bilateral median sensory response showed mildly prolonged distal latency with mildly decreased snap amplitude on the right side.  Bilateral ulnar motor responses showed mildly decreased CMAP amplitude.  Left median motor responses showed mildly prolonged distal latency with normal CMAP amplitude.  Right median motor responses showed borderline distal latency.  Electromyography:  Selected needle exam lesions were performed at right upper, lower extremity muscles, right cervical, facial muscles.  There is evidence of chronic myopathic changes involving right orbicularis oculi, frontalis, proximal upper extremity muscles.  Needle examination of right tibialis anterior, showed evidence of increased insertional activity, rubbish insertion sensation, chronic polyphasic and large motor unit potentials with decreased recruitment patterns.  Needle examination of the bilateral abductor pollicis brevis showed no evidence of active denervation, enlarged motor unit potentials with mildly decreased recruitment patterns.  Conclusion: This is an abnormal study.  There is electrodiagnostic evidence of bilateral median neuropathy across the wrist, moderate on the left, mild on the right, demyelinating in nature, there was no evidence of axonal loss.  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    ------------------------------- Levert Feinstein, M.D. PhD  [facility] [address] [place], [place] [address] Tel: [phone] Fax: [phone]  Verbal informed consent was obtained from the patient, patient was informed of potential risk of procedure, including bruising, bleeding, hematoma formation, infection, muscle weakness, muscle pain, numbness, among others.        MNC    Nerve / Sites Muscle Latency Ref. Amplitude Ref. Rel Amp Segments Distance Velocity Ref. Area    ms ms mV mV %  cm m/s m/s mVms  R Median - APB     Wrist APB 4.2 ?4.4 4.1 ?4.0 100 Wrist - APB 7   16.0     Upper arm APB 9.7  4.2  102 Upper arm - Wrist 24 44 ?49 17.5  L Median - APB     Wrist APB 4.9 ?4.4 4.3 ?4.0 100 Wrist - APB 7   18.3     Upper arm APB 9.3  3.6  82.4 Upper arm - Wrist 24.6 57 ?49 15.7  R Ulnar - ADM     Wrist ADM 3.1 ?3.3 4.5 ?6.0 100 Wrist - ADM 7   14.4     B.Elbow ADM 5.6  4.2  93 B.Elbow - Wrist 13 50 ?49 14.8     A.Elbow ADM 8.6  4.3  103 A.Elbow - B.Elbow 16 55 ?49 15.0  L Ulnar - ADM     Wrist ADM 2.7 ?3.3 5.3 ?6.0 100 Wrist - ADM 7   15.9     B.Elbow ADM 5.1  5.0  95 B.Elbow - Wrist 11 45 ?49 15.7     A.Elbow ADM 8.5  4.8  96.2 A.Elbow - B.Elbow 24 72 ?49 15.6             SNC    Nerve /  Sites Rec. Site Peak Lat Ref.  Amp Ref. Segments Distance    ms ms V V  cm  R Median - Orthodromic (Dig II, Mid palm)     Dig II Wrist 4.5 ?3.4 7 ?10 Dig II - Wrist 13  L Median - Orthodromic (Dig II, Mid palm)     Dig II Wrist 4.5 ?3.4 12 ?10 Dig II - Wrist 13  R Ulnar - Orthodromic, (Dig V, Mid palm)     Dig V Wrist 3.0 ?3.1 9 ?5 Dig V - Wrist 11  L Ulnar - Orthodromic, (Dig V, Mid palm)     Dig V Wrist 2.8 ?3.1 5 ?5 Dig V - Wrist 65             F  Wave    Nerve F Lat Ref.   ms ms  R Ulnar - ADM 32.8 ?32.0  L Ulnar - ADM 31.6 ?32.0         EMG Summary Table    Spontaneous MUAP Recruitment   Muscle IA Fib PSW Fasc Other Amp Dur. Poly Pattern  R. First dorsal interosseous Normal None None None _______ Normal Normal Normal Normal  R. Abductor pollicis brevis Increased None None None _______ Increased Increased 1+ Reduced  R. Pronator teres Normal None None None _______ Normal Normal 1+ Normal  R. Deltoid Increased None None None _______ Normal Normal 1+ Normal  R. Biceps brachii Increased None None None _______ Normal Normal 1+ Normal  L. Abductor pollicis brevis Normal None None None _______ Increased Increased 1+ Reduced  R. Cervical paraspinals Increased None None None _______ Normal Normal 2+ Normal  R. Tibialis anterior Increased None None None _______ Increased Increased 3+ Normal  R. Tibialis posterior Increased None None None _______ Increased Increased 1+ Normal  R. Gastrocnemius (Medial head) Increased None None None _______ Normal Normal 1+ Normal  R. Frontalis Increased None None None _______ Normal Normal 1+ Normal  R. Orbicularis oculi Increased None None None _______ Normal Normal 1+ Normal

## 2022-10-29 ENCOUNTER — Ambulatory Visit: Payer: 59 | Admitting: Physical Therapy

## 2022-10-30 ENCOUNTER — Telehealth: Payer: Self-pay | Admitting: Neurology

## 2022-10-30 NOTE — Telephone Encounter (Signed)
Referral for neurology fax to [facility]. Phone: [phone]

## 2022-11-07 ENCOUNTER — Ambulatory Visit: Payer: 59 | Attending: Internal Medicine | Admitting: Physical Therapy

## 2022-11-07 ENCOUNTER — Encounter: Payer: Self-pay | Admitting: Physical Therapy

## 2022-11-07 DIAGNOSIS — R2689 Other abnormalities of gait and mobility: Secondary | ICD-10-CM | POA: Insufficient documentation

## 2022-11-07 DIAGNOSIS — Z9181 History of falling: Secondary | ICD-10-CM | POA: Insufficient documentation

## 2022-11-07 DIAGNOSIS — M6281 Muscle weakness (generalized): Secondary | ICD-10-CM | POA: Diagnosis not present

## 2022-11-07 NOTE — Therapy (Signed)
OUTPATIENT PHYSICAL THERAPY TREATMENT   Patient Name: [name] MRN: [id number] DOB:[birth year], 47 y.o., female Today's Date: 11/07/2022  PCP: Eunice Blase, PA-C REFERRING PROVIDER: Eunice Blase, PA-C  END OF SESSION:   PT End of Session - 11/07/22 1332     Visit Number 51    Number of Visits 73    Date for PT Re-Evaluation 01/09/23    Authorization Type [employer] MEDICARE/Medicaid reporting period from 10/17/2022    Progress Note Due on Visit 50    PT Start Time 1302    PT Stop Time 1340    PT Time Calculation (min) 38 min    Equipment Utilized During Treatment Gait belt    Activity Tolerance Patient tolerated treatment well    Behavior During Therapy WFL for tasks assessed/performed               Past Medical History:  Diagnosis Date   Alcohol abuse    Anxiety    Anxiety    Anxiety and depression    Asthma    Bipolar disorder ([name])    Borderline personality disorder ([name])    Depression    Diabetes ([name])    Type 2    Dyslipidemia    Esophageal varices ([name])    Foot drop    right   [name] ([name])    GERD (gastroesophageal reflux disease)    Gout    History of drug abuse ([name])    Insomnia    Muscular dystrophies ([name])    Neuropathy    Pancreatitis    Recovering alcoholic ([name])    Past Surgical History:  Procedure Laterality Date   ESOPHAGOGASTRODUODENOSCOPY N/A 04/25/2022   Procedure: ESOPHAGOGASTRODUODENOSCOPY (EGD);  Surgeon: [name], MD;  Location: [facility] ENDOSCOPY;  Service: Gastroenterology;  Laterality: N/A;   HERNIA REPAIR     IR FLUORO GUIDE CV LINE RIGHT  12/30/2020   IR REMOVAL TUN CV CATH W/O FL  01/19/2021   IR US GUIDE VASC ACCESS RIGHT  12/30/2020   Patient Active Problem List   Diagnosis Date Noted   Gait abnormality 05/24/2022   Right hand weakness 05/24/2022   Frequent falls 02/12/2021   Leukocytosis    Aspiration pneumonia of both lower lobes due to gastric secretions ([name]) 01/04/2021    Pressure injury of skin 12/24/2020   Acute hepatic encephalopathy ([name]) 12/20/2020   Sepsis ([name]) 12/19/2020   Acute hyponatremia 12/18/2020   Alcohol use disorder, moderate, dependence ([name]) 12/18/2020   Hepatic steatosis 12/18/2020   Polysubstance abuse ([name]) 12/18/2020   Hyperglycemia due to type 2 diabetes mellitus ([name]) 12/18/2020   Hyperbilirubinemia 12/18/2020   Lactic acidosis 12/18/2020   Alcoholic liver disease ([name]) 12/18/2020   Bipolar 1 disorder, manic, moderate ([name]) 09/10/2018   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   Diabetes ([name]) 09/10/2018   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   Chronic gout of foot 09/29/2015   Muscular dystrophy ([name]) 09/29/2015   Type 2 diabetes mellitus without complication ([name]) 09/29/2015   Anxiety and depression 10/01/2012   Protein-calorie malnutrition, severe ([name]) 09/30/2012   Trichomonas infection 09/26/2012   Dehydration 09/26/2012   Hypokalemia 11/14/2011   Thrombocytopenia ([name]) 11/14/2011   Bipolar 2 disorder ([name]) 11/13/2011   Gout 11/13/2011     REFERRING DIAG: bilateral leg weakness  THERAPY DIAG:  Other abnormalities of gait and mobility  Muscle weakness (generalized)  History of falling  Rationale for Evaluation and Treatment Rehabilitation  PERTINENT HISTORY: Patient is a 47 y.o. female who  presents to outpatient physical therapy with a referral for medical diagnosis bilateral leg weakness. This patient's chief complaints consist of difficulty with weakness and balance with history of frequent falling leading to the following functional deficits: difficulty with basic mobility including ambulation, stairs, transfers; difficulty with ADLs, IADLs. Currently requires assistance to navigate stairs, assistance with ADLs and  IADLs, and ambulates with RW when she previously did not need an assistive device. Relevant past medical history and comorbidities include muscular dystrophy (FSHD -  facioscapulohumeral muscular dystrophy), anxiety and depression, bipolar 1 disorder, borderline personality disorder, diabetes, GERD, gout, pancreatitis, alcoholic liver disease, tobacco abuse,  foot drop, neuropathy, recovering alcoholic, history of drug abuse, history of hernia repair.  Patient denies hx of cancer, stroke, seizures, lung problems, heart problems, unexplained weight loss, unexplained changes in bowel or bladder problems, unexplained stumbling or dropping things, osteoporosis, and spinal surgery.   PRECAUTIONS: Fall, not drink alcohol.  SUBJECTIVE: Patient arrives with rollator. She states she missed last PT session because she was quarantining when her roommate/cousin had COVID19. She did not contract it and is feeling fine. She did her HEP once since last PT session. She denies any recent falls but came close a couple of times.   PAIN: NPRS 4-4.5/10 right lower back to right glute.   OBJECTIVE    TODAY'S TREATMENT:  Therapeutic exercise: to centralize symptoms and improve ROM, strength, muscular endurance, and activity tolerance required for successful completion of functional activities.  - prone press up 3x5  - sit <> stand 3x7/6/6 from 18.5 inch table.  - sled push two sets of 1x64ft (sled only, 75lb)   - education with handouts on mindful eating, hunger scale, behavior change strategies to help patient work towards personal fat loss goals that will positively affect musculoskeletal and functional health.   Manual therapy: to reduce pain and tissue tension, improve range of motion, neuromodulation, in order to promote improved ability to complete functional activities. PRONE - STM to bilateral lumbar paraspinals and right posterior hip musculature including gludes and deep hip rotators.    Modality: (unbilled) Dry needling performed to low back and right piriformis to decrease pain and spasms along patient's low back and right glute region with patient in prone utilizing  5 dry needle(s) .30mm x with 1 stick each level  each side at lumbar multifidi at L4 and L5  and 2 dry needle(s) .30mm x 1 stick at right piriformis. Patient educated about the risks and benefits from therapy and verbally consents to treatment and draped appropriately. Dry needling performed by [name]. Ilsa Iha PT, DPT who is certified in this technique.  Pt required multimodal cuing for proper technique and to facilitate improved neuromuscular control, strength, range of motion, and functional ability resulting in improved performance and form.   PATIENT EDUCATION:  Education details: Exercise purpose/form. Self management techniques. Education on diagnosis, prognosis, POC, anatomy and physiology of current condition. Dry needling.  Person educated: Patient Education method: Explanation, demonstration, verbal cuing, tactile cuing. Education comprehension: verbalized understanding, demonstrated understanding, and needs further education      HOME EXERCISE PROGRAM: Access Code: [email or web address] URL: [email or web address] Date: 08/20/2022 Prepared by: Norton Blizzard  Exercises - Sit to Stand Without Arm Support  - 3 x weekly - 3 sets - 5 reps - Seated Toe Raise  - 3 x weekly - 3 sets - 6 reps - 1 second hold - Supine Lower Trunk Rotation  - 3 x weekly - 1 sets - 20 reps -  1-5 seconds hold - Prone Press Up  - 3 x weekly - 2 sets - 5 reps    ASSESSMENT:   CLINICAL IMPRESSION:  Patient returns to PT after missing sessions due to quarantining due to COVID19 in her household (but she did not have symptoms). Today's session utilized dry needling for pain control and continued with strengthening exercises as tolerated. Patient would benefit from continued management of limiting condition by skilled physical therapist to address remaining impairments and functional limitations to work towards stated goals and return to PLOF or maximal functional independence.   OBJECTIVE IMPAIRMENTS  Abnormal gait, decreased activity tolerance, decreased balance, decreased coordination, decreased endurance, decreased mobility, difficulty walking, decreased ROM, decreased strength, impaired perceived functional ability, increased muscle spasms, impaired tone, impaired UE functional use, improper body mechanics, postural dysfunction, and pain.    ACTIVITY LIMITATIONS cleaning, community activity, driving, meal prep, laundry, and shopping.    PERSONAL FACTORS Behavior pattern, Fitness, Past/current experiences, Time since onset of injury/illness/exacerbation, Transportation, and 3+ comorbidities:    muscular dystrophy (FSHD - facioscapulohumeral muscular dystrophy), anxiety and depression, bipolar 1 disorder, borderline personality disorder, diabetes, GERD, gout, pancreatitis, alcoholic liver disease, tobacco abuse,  foot drop, neuropathy, recovering alcoholic, history of drug abuse, history of hernia repair are also affecting patient's functional outcome.      REHAB POTENTIAL: Good   CLINICAL DECISION MAKING: Evolving/moderate complexity   EVALUATION COMPLEXITY: Moderate     GOALS: Goals reviewed with patient? No   SHORT TERM GOALS: Target date: 07/24/2021   Patient will be independent with initial home exercise program for self-management of symptoms. Baseline: Initial HEP to be provided at visit 2 as appropriate (07/10/21); Goal status: Met     LONG TERM GOALS: Target date: 10/02/2021; updated to 12/27/2021 for all unmet goals on 10/04/2021; updated to 03/26/2022 for all unmet goals on 01/01/2022; updated to 06/19/2022 for all unmet goals on 03/27/2022; updated to 08/14/2022 for all unmet goals on 05/22/2022, updated to 11/12/2022 for all unmet goals on 08/20/2022, updated to 01/09/2023 for all unmet goals on 10/17/2022.    Patient will be independent with a long-term home exercise program for self-management of symptoms.  Baseline: Initial HEP to be provided at visit 2 as appropriate (07/10/21);  participating as tolerated, recently too much pain to participate (09/04/2021); participating regularly (10/04/2021); participating as able (11/21/2021); not participating regularly over last couple of weeks (01/01/2022); no participating regularly since last progress note (02/15/2022); improving participation (03/27/2022); has not last two weeks due to recent fall (05/17/22); has not been participating (08/20/2022); patient is participating in HEP 2x a week (10/17/2022);  Goal status:  In-progress   2.  Patient will demonstrate improved FOTO score by 10 points from baseline to demonstrate improvement in overall condition and self-reported functional ability.  Baseline: to be measured visit 2 as appropriate (07/10/21); 46 at visit # 2 (07/17/2021); 50 at visit # 10 (09/04/2021); 53 at visit #15 (10/04/2021); 50 at visit # 20 (11/21/2021); 52 at visit # 26 (01/01/2022); 50 at visit #30 (02/15/2022); 54 at visit # 34 (03/27/22);  53 (05/17/22); 51 at visit #44 (08/20/2022); 58 at visit #50 (10/17/2022);  Goal status: In-Progress   3.  Patient will ambulate equal or greater than 1000 feet during 6 Min Walk Test using [facility] or less restrictive assistive device to improve community ambulation.  Baseline: To be tested visit 2 as appropriate (07/10/21); 310 feet with RW (07/17/2021); 556 feet with rollator (09/04/2021); 600 feet with rollator (10/04/2021); 300 feet with  rollator - had to stop test at 3 min due to intolerable right glute pain (11/21/2021); 420 feet with rollator and steppage gait, Dixon > L. Stopped at 5 min due to feeling of instability in low back to mid thoracic spine (01/01/2022); 573 feet with rollator and steppage gait, Dixon > L. Occasional standing breaks. Low back pain increased to 3.5/10 (02/15/2022);  381 feet with rollator and steppage gait, Dixon > L. Stopped at 4:30 min due to pain in low back and right glute (03/27/2022); 124' in 1 minute 22 sec. Had to discontinue due to Dixon back/hip pain (05/17/2022); 200 feet with rollator,  discontinued due to low back/glute pain (08/20/2022); 500 feet with rollator (10/17/2022);  Goal status: In-Progress   4.  Patient will complete 5 Time Sit To Stand test in equal or less than 20 seconds with no UE support from 18.5 inch plinth to demonstrate improved LE functional strength and power for basic mobility and improved fall risk.  Baseline: 24 seconds with B UE on 18.5 inch plinth.  (07/10/21); 20 seconds with B UE on thighs from 18.5 inch plinth (09/04/2021); 19 seconds with B UE on thighs from 18.5 inch plinth (10/04/2021); 22 seconds with B UE on thighs from 18.5 inch plinth (11/21/2021); 20 seconds with B UE on thighs from 18.5 inch plinth (01/01/2022); 19 seconds without use of B UE support (02/15/2022); 21 seconds with no UE support from 18.5 inch plinth (03/27/2022); 20.5 seconds no UE support and 18" surface (05/17/2022); 20 seconds with no UE support from 18.5 inch plinth (08/20/2022);  Goal status: in progress     5.  Patient be able to ascend and descend stairs at her home mod I with use of B handrails.  Baseline: able to ascend/descend 4 steps in the clinic with B UE support on handrails and supervision - reports currently needs assistance to descend steps at home. (07/10/21); able to ascend/descend 4 steps in the clinic with B UE support on handrails and supervision, leading up and down with left. able to perform with reversed LE leads (09/04/2021); able to ascend/descend 4 steps in the clinic with B UE support on handrails and supervision, step over step with heavy UE use on the way down (10/04/2021); able to ascend/descend 4 steps in the clinic with B UE support on handrails and supervision, step over step up and step to leading with Dixon LE with heavy UE use on the way down (11/21/2021); ascend/descends 4 steps in the clinic with B UE support on handrails and supervision, step over step up and down with heavy UE use on the way down (01/01/2022); able to ascend/descend 4 steps in the clinic with B  UE support on handrails and supervision, step over step up and down with heavy UE use on the way down. leads up with right foot step to gait (02/15/2022; 03/27/22); able to ascend/descend 4 steps in the clinic with B UE support on handrails and supervision, step over step up and down with moderately heavy UE use on the way down (08/20/2022); able to ascend/descend 4 steps in the clinic with B UE support on handrails and supervision, step over step up and down with UE use on the way down (10/17/2022);  Goal status: MET   6.  Patient will demonstrate ability to perform static balance in tandem stance for equal or greater than 30 seconds on each side, to demonstrate improved balance and decreased fall risk.  Baseline: right foot front 10 seconds, left foot front 7  seconds (07/10/2021); right foot front 4 seconds, left foot front 6 seconds (09/04/2021); right foot front 9 seconds, left foot front 31 seconds (10/04/2021); right foot front 6.5 seconds, left foot front 8.5 seconds (11/21/2021); right foot front 8 seconds, left foot front 7 seconds (01/01/2022);  right foot front 9 seconds, left foot front 20 seconds (02/15/2022); right foot front 30 seconds, left foot front 45 seconds (03/27/2022); right foot front 16 seconds, left foot front 17 seconds (08/20/2022); right foot front 26 seconds, left foot front 43 seconds (10/17/2022);  Goal status: MET 03/27/22     PLAN: PT FREQUENCY: 2x/week   PT DURATION: 12 weeks   PLANNED INTERVENTIONS: Therapeutic exercises, Therapeutic activity, Neuromuscular re-education, Balance training, Gait training, Patient/Family education, Joint mobilization, Orthotic/Fit training, DME instructions, Dry Needling, Electrical stimulation, Cryotherapy, Moist heat, Splintting, Taping, and Manual therapy   PLAN FOR NEXT SESSION: continue with moderate intensity functional strengthening and balance exercises as able, address back and right glute pain to allow return to functional strengthening and  balance with better tolerance.     [name]. Ilsa Iha, PT, DPT 11/07/22, 2:04 PM  [facility] [address] [place], [place] [address] P: [phone] I F: [phone]

## 2022-11-12 ENCOUNTER — Encounter: Payer: Self-pay | Admitting: Physical Therapy

## 2022-11-12 ENCOUNTER — Ambulatory Visit: Payer: 59 | Admitting: Physical Therapy

## 2022-11-12 DIAGNOSIS — R2689 Other abnormalities of gait and mobility: Secondary | ICD-10-CM | POA: Diagnosis not present

## 2022-11-12 DIAGNOSIS — M6281 Muscle weakness (generalized): Secondary | ICD-10-CM | POA: Diagnosis not present

## 2022-11-12 DIAGNOSIS — Z9181 History of falling: Secondary | ICD-10-CM

## 2022-11-12 NOTE — Therapy (Signed)
OUTPATIENT PHYSICAL THERAPY TREATMENT   Patient Name: [name] MRN: [id number] DOB:04-10-1975, 47 y.o., female Today's Date: 11/12/2022  PCP: Eunice Blase, PA-C REFERRING PROVIDER: Eunice Blase, PA-C  END OF SESSION:   PT End of Session - 11/12/22 1429     Visit Number 52    Number of Visits 73    Date for PT Re-Evaluation 01/09/23    Authorization Type [employer] MEDICARE/Medicaid reporting period from 10/17/2022    Progress Note Due on Visit 50    PT Start Time 1354    PT Stop Time 1425    PT Time Calculation (min) 31 min    Equipment Utilized During Treatment Gait belt    Activity Tolerance Patient tolerated treatment well    Behavior During Therapy WFL for tasks assessed/performed                Past Medical History:  Diagnosis Date   Alcohol abuse    Anxiety    Anxiety    Anxiety and depression    Asthma    Bipolar disorder ([name])    Borderline personality disorder ([name])    Depression    Diabetes ([name])    Type 2    Dyslipidemia    Esophageal varices ([name])    Foot drop    right   [name] ([name])    GERD (gastroesophageal reflux disease)    Gout    History of drug abuse ([name])    Insomnia    Muscular dystrophies ([name])    Neuropathy    Pancreatitis    Recovering alcoholic ([name])    Past Surgical History:  Procedure Laterality Date   ESOPHAGOGASTRODUODENOSCOPY N/A 04/25/2022   Procedure: ESOPHAGOGASTRODUODENOSCOPY (EGD);  Surgeon: [name], MD;  Location: [facility] ENDOSCOPY;  Service: Gastroenterology;  Laterality: N/A;   HERNIA REPAIR     IR FLUORO GUIDE CV LINE RIGHT  12/30/2020   IR REMOVAL TUN CV CATH W/O FL  01/19/2021   IR US GUIDE VASC ACCESS RIGHT  12/30/2020   Patient Active Problem List   Diagnosis Date Noted   Gait abnormality 05/24/2022   Right hand weakness 05/24/2022   Frequent falls 02/12/2021   Leukocytosis    Aspiration pneumonia of both lower lobes due to gastric secretions ([name]) 01/04/2021    Pressure injury of skin 12/24/2020   Acute hepatic encephalopathy ([name]) 12/20/2020   Sepsis ([name]) 12/19/2020   Acute hyponatremia 12/18/2020   Alcohol use disorder, moderate, dependence ([name]) 12/18/2020   Hepatic steatosis 12/18/2020   Polysubstance abuse ([name]) 12/18/2020   Hyperglycemia due to type 2 diabetes mellitus ([name]) 12/18/2020   Hyperbilirubinemia 12/18/2020   Lactic acidosis 12/18/2020   Alcoholic liver disease ([name]) 12/18/2020   Bipolar 1 disorder, manic, moderate ([name]) 09/10/2018   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   Diabetes ([name]) 09/10/2018   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   Chronic gout of foot 09/29/2015   Muscular dystrophy ([name]) 09/29/2015   Type 2 diabetes mellitus without complication ([name]) 09/29/2015   Anxiety and depression 10/01/2012   Protein-calorie malnutrition, severe ([name]) 09/30/2012   Trichomonas infection 09/26/2012   Dehydration 09/26/2012   Hypokalemia 11/14/2011   Thrombocytopenia ([name]) 11/14/2011   Bipolar 2 disorder ([name]) 11/13/2011   Gout 11/13/2011     REFERRING DIAG: bilateral leg weakness  THERAPY DIAG:  Other abnormalities of gait and mobility  Muscle weakness (generalized)  History of falling  Rationale for Evaluation and Treatment Rehabilitation  PERTINENT HISTORY: Patient is a 47 y.o. female  who presents to outpatient physical therapy with a referral for medical diagnosis bilateral leg weakness. This patient's chief complaints consist of difficulty with weakness and balance with history of frequent falling leading to the following functional deficits: difficulty with basic mobility including ambulation, stairs, transfers; difficulty with ADLs, IADLs. Currently requires assistance to navigate stairs, assistance with ADLs and  IADLs, and ambulates with RW when she previously did not need an assistive device. Relevant past medical history and comorbidities include muscular dystrophy (FSHD -  facioscapulohumeral muscular dystrophy), anxiety and depression, bipolar 1 disorder, borderline personality disorder, diabetes, GERD, gout, pancreatitis, alcoholic liver disease, tobacco abuse,  foot drop, neuropathy, recovering alcoholic, history of drug abuse, history of hernia repair.  Patient denies hx of cancer, stroke, seizures, lung problems, heart problems, unexplained weight loss, unexplained changes in bowel or bladder problems, unexplained stumbling or dropping things, osteoporosis, and spinal surgery.   PRECAUTIONS: Fall, not drink alcohol.  SUBJECTIVE: Patient arrives with rollator. She states she is doing okay. She states it feels like there is "sand in her joints" lately (for the last couple of weeks). It feels like she is "walking through water" or "walking in slow motion." She states she has not done her HEP since last PT session. She has had lack of motivation to do anything lately.   PAIN: NPRS 3/10 right lower back to right glute.   OBJECTIVE    TODAY'S TREATMENT:  Therapeutic exercise: to centralize symptoms and improve ROM, strength, muscular endurance, and activity tolerance required for successful completion of functional activities.  - NuStep level 6 using bilateral upper and lower extremities. Seat/handle setting 10/10. For improved extremity mobility, muscular endurance, and activity tolerance; and to induce the analgesic effect of aerobic exercise, stimulate improved joint nutrition, and prepare body structures and systems for following interventions. x 67  minutes. Average SPM = 67. - seated leg press, seat position 1, 3x10 at 45#, double leg stance.  - standing double leg heel raise, with B UE support, 3x10.   Pt required multimodal cuing for proper technique and to facilitate improved neuromuscular control, strength, range of motion, and functional ability resulting in improved performance and form.   PATIENT EDUCATION:  Education details: Exercise purpose/form.  Self management techniques. Education on diagnosis, prognosis, POC, anatomy and physiology of current condition. Dry needling.  Person educated: Patient Education method: Explanation, demonstration, verbal cuing, tactile cuing. Education comprehension: verbalized understanding, demonstrated understanding, and needs further education      HOME EXERCISE PROGRAM: Access Code: [email or web address] URL: [email or web address] Date: 08/20/2022 Prepared by: Norton Blizzard  Exercises - Sit to Stand Without Arm Support  - 3 x weekly - 3 sets - 5 reps - Seated Toe Raise  - 3 x weekly - 3 sets - 6 reps - 1 second hold - Supine Lower Trunk Rotation  - 3 x weekly - 1 sets - 20 reps - 1-5 seconds hold - Prone Press Up  - 3 x weekly - 2 sets - 5 reps    ASSESSMENT:   CLINICAL IMPRESSION:  Patient arrives to PT with pain better controlled than at last PT session. Patient tolerated treatment well overall with appropriate fatigue. Plan to continue with strengthening as tolerated next session. Patient would benefit from continued management of limiting condition by skilled physical therapist to address remaining impairments and functional limitations to work towards stated goals and return to PLOF or maximal functional independence.   OBJECTIVE IMPAIRMENTS Abnormal gait, decreased activity tolerance, decreased balance, decreased coordination, decreased endurance, decreased  mobility, difficulty walking, decreased ROM, decreased strength, impaired perceived functional ability, increased muscle spasms, impaired tone, impaired UE functional use, improper body mechanics, postural dysfunction, and pain.    ACTIVITY LIMITATIONS cleaning, community activity, driving, meal prep, laundry, and shopping.    PERSONAL FACTORS Behavior pattern, Fitness, Past/current experiences, Time since onset of injury/illness/exacerbation, Transportation, and 3+ comorbidities:    muscular dystrophy (FSHD - facioscapulohumeral muscular  dystrophy), anxiety and depression, bipolar 1 disorder, borderline personality disorder, diabetes, GERD, gout, pancreatitis, alcoholic liver disease, tobacco abuse,  foot drop, neuropathy, recovering alcoholic, history of drug abuse, history of hernia repair are also affecting patient's functional outcome.      REHAB POTENTIAL: Good   CLINICAL DECISION MAKING: Evolving/moderate complexity   EVALUATION COMPLEXITY: Moderate     GOALS: Goals reviewed with patient? No   SHORT TERM GOALS: Target date: 07/24/2021   Patient will be independent with initial home exercise program for self-management of symptoms. Baseline: Initial HEP to be provided at visit 2 as appropriate (07/10/21); Goal status: Met     LONG TERM GOALS: Target date: 10/02/2021; updated to 12/27/2021 for all unmet goals on 10/04/2021; updated to 03/26/2022 for all unmet goals on 01/01/2022; updated to 06/19/2022 for all unmet goals on 03/27/2022; updated to 08/14/2022 for all unmet goals on 05/22/2022, updated to 11/12/2022 for all unmet goals on 08/20/2022, updated to 01/09/2023 for all unmet goals on 10/17/2022.    Patient will be independent with a long-term home exercise program for self-management of symptoms.  Baseline: Initial HEP to be provided at visit 2 as appropriate (07/10/21); participating as tolerated, recently too much pain to participate (09/04/2021); participating regularly (10/04/2021); participating as able (11/21/2021); not participating regularly over last couple of weeks (01/01/2022); no participating regularly since last progress note (02/15/2022); improving participation (03/27/2022); has not last two weeks due to recent fall (05/17/22); has not been participating (08/20/2022); patient is participating in HEP 2x a week (10/17/2022);  Goal status:  In-progress   2.  Patient will demonstrate improved FOTO score by 10 points from baseline to demonstrate improvement in overall condition and self-reported functional ability.  Baseline:  to be measured visit 2 as appropriate (07/10/21); 47 at visit # 2 (07/17/2021); 50 at visit # 10 (09/04/2021); 53 at visit #15 (10/04/2021); 50 at visit # 20 (11/21/2021); 52 at visit # 26 (01/01/2022); 50 at visit #30 (02/15/2022); 54 at visit # 34 (03/27/22);  53 (05/17/22); 51 at visit #44 (08/20/2022); 58 at visit #50 (10/17/2022);  Goal status: In-Progress   3.  Patient will ambulate equal or greater than 1000 feet during 6 Min Walk Test using [facility] or less restrictive assistive device to improve community ambulation.  Baseline: To be tested visit 2 as appropriate (07/10/21); 310 feet with RW (07/17/2021); 556 feet with rollator (09/04/2021); 600 feet with rollator (10/04/2021); 300 feet with rollator - had to stop test at 3 min due to intolerable right glute pain (11/21/2021); 420 feet with rollator and steppage gait, R > L. Stopped at 5 min due to feeling of instability in low back to mid thoracic spine (01/01/2022); 573 feet with rollator and steppage gait, R > L. Occasional standing breaks. Low back pain increased to 3.5/10 (02/15/2022);  381 feet with rollator and steppage gait, R > L. Stopped at 4:30 min due to pain in low back and right glute (03/27/2022); 124' in 1 minute 22 sec. Had to discontinue due to R back/hip pain (05/17/2022); 200 feet with rollator, discontinued due to low back/glute pain (  08/20/2022); 500 feet with rollator (10/17/2022);  Goal status: In-Progress   4.  Patient will complete 5 Time Sit To Stand test in equal or less than 20 seconds with no UE support from 18.5 inch plinth to demonstrate improved LE functional strength and power for basic mobility and improved fall risk.  Baseline: 24 seconds with B UE on 18.5 inch plinth.  (07/10/21); 20 seconds with B UE on thighs from 18.5 inch plinth (09/04/2021); 19 seconds with B UE on thighs from 18.5 inch plinth (10/04/2021); 22 seconds with B UE on thighs from 18.5 inch plinth (11/21/2021); 20 seconds with B UE on thighs from 18.5 inch plinth (01/01/2022);  19 seconds without use of B UE support (02/15/2022); 21 seconds with no UE support from 18.5 inch plinth (03/27/2022); 20.5 seconds no UE support and 18" surface (05/17/2022); 20 seconds with no UE support from 18.5 inch plinth (08/20/2022);  Goal status: in progress     5.  Patient be able to ascend and descend stairs at her home mod I with use of B handrails.  Baseline: able to ascend/descend 4 steps in the clinic with B UE support on handrails and supervision - reports currently needs assistance to descend steps at home. (07/10/21); able to ascend/descend 4 steps in the clinic with B UE support on handrails and supervision, leading up and down with left. able to perform with reversed LE leads (09/04/2021); able to ascend/descend 4 steps in the clinic with B UE support on handrails and supervision, step over step with heavy UE use on the way down (10/04/2021); able to ascend/descend 4 steps in the clinic with B UE support on handrails and supervision, step over step up and step to leading with R LE with heavy UE use on the way down (11/21/2021); ascend/descends 4 steps in the clinic with B UE support on handrails and supervision, step over step up and down with heavy UE use on the way down (01/01/2022); able to ascend/descend 4 steps in the clinic with B UE support on handrails and supervision, step over step up and down with heavy UE use on the way down. leads up with right foot step to gait (02/15/2022; 03/27/22); able to ascend/descend 4 steps in the clinic with B UE support on handrails and supervision, step over step up and down with moderately heavy UE use on the way down (08/20/2022); able to ascend/descend 4 steps in the clinic with B UE support on handrails and supervision, step over step up and down with UE use on the way down (10/17/2022);  Goal status: MET   6.  Patient will demonstrate ability to perform static balance in tandem stance for equal or greater than 30 seconds on each side, to demonstrate  improved balance and decreased fall risk.  Baseline: right foot front 10 seconds, left foot front 7 seconds (07/10/2021); right foot front 4 seconds, left foot front 6 seconds (09/04/2021); right foot front 9 seconds, left foot front 31 seconds (10/04/2021); right foot front 6.5 seconds, left foot front 8.5 seconds (11/21/2021); right foot front 8 seconds, left foot front 7 seconds (01/01/2022);  right foot front 9 seconds, left foot front 20 seconds (02/15/2022); right foot front 30 seconds, left foot front 45 seconds (03/27/2022); right foot front 16 seconds, left foot front 17 seconds (08/20/2022); right foot front 26 seconds, left foot front 43 seconds (10/17/2022);  Goal status: MET 03/27/22     PLAN: PT FREQUENCY: 2x/week   PT DURATION: 12 weeks   PLANNED  INTERVENTIONS: Therapeutic exercises, Therapeutic activity, Neuromuscular re-education, Balance training, Gait training, Patient/Family education, Joint mobilization, Orthotic/Fit training, DME instructions, Dry Needling, Electrical stimulation, Cryotherapy, Moist heat, Splintting, Taping, and Manual therapy   PLAN FOR NEXT SESSION: continue with moderate intensity functional strengthening and balance exercises as able, address back and right glute pain to allow return to functional strengthening and balance with better tolerance.     [name]. Ilsa Iha, PT, DPT 11/12/22, 2:32 PM  [facility] [address] [place], [place] [address] P: [phone] I F: [phone]  Addendum to correct error in charges. [name]. Ilsa Iha, PT, DPT 11/15/22, 12:48 PM

## 2022-11-21 ENCOUNTER — Ambulatory Visit: Payer: 59 | Admitting: Physical Therapy

## 2022-11-26 ENCOUNTER — Ambulatory Visit: Payer: 59 | Admitting: Physical Therapy

## 2022-12-02 DIAGNOSIS — M62830 Muscle spasm of back: Secondary | ICD-10-CM | POA: Diagnosis not present

## 2022-12-02 DIAGNOSIS — R531 Weakness: Secondary | ICD-10-CM | POA: Diagnosis not present

## 2022-12-02 DIAGNOSIS — E1140 Type 2 diabetes mellitus with diabetic neuropathy, unspecified: Secondary | ICD-10-CM | POA: Diagnosis not present

## 2022-12-02 DIAGNOSIS — S39012A Strain of muscle, fascia and tendon of lower back, initial encounter: Secondary | ICD-10-CM | POA: Diagnosis not present

## 2022-12-04 ENCOUNTER — Ambulatory Visit: Payer: 59 | Admitting: Physical Therapy

## 2022-12-11 ENCOUNTER — Encounter: Payer: Self-pay | Admitting: Physical Therapy

## 2022-12-11 ENCOUNTER — Ambulatory Visit: Payer: 59 | Attending: Internal Medicine | Admitting: Physical Therapy

## 2022-12-11 DIAGNOSIS — R2689 Other abnormalities of gait and mobility: Secondary | ICD-10-CM | POA: Insufficient documentation

## 2022-12-11 DIAGNOSIS — M6281 Muscle weakness (generalized): Secondary | ICD-10-CM | POA: Insufficient documentation

## 2022-12-11 DIAGNOSIS — Z9181 History of falling: Secondary | ICD-10-CM | POA: Diagnosis not present
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
## 2022-12-18 ENCOUNTER — Ambulatory Visit: Payer: 59 | Attending: Internal Medicine | Admitting: Physical Therapy

## 2022-12-18 ENCOUNTER — Encounter: Payer: Self-pay | Admitting: Physical Therapy

## 2022-12-18 DIAGNOSIS — R2689 Other abnormalities of gait and mobility: Secondary | ICD-10-CM | POA: Diagnosis not present

## 2022-12-18 DIAGNOSIS — M6281 Muscle weakness (generalized): Secondary | ICD-10-CM | POA: Diagnosis not present

## 2022-12-18 DIAGNOSIS — Z9181 History of falling: Secondary | ICD-10-CM | POA: Diagnosis not present

## 2022-12-18 NOTE — Therapy (Signed)
OUTPATIENT PHYSICAL THERAPY TREATMENT   Patient Name: [name] MRN: [id number] DOB:Aug 12, 1975, 47 y.o., female Today's Date: 12/18/2022  PCP: Eunice Blase, PA-C REFERRING PROVIDER: Eunice Blase, PA-C  END OF SESSION:   PT End of Session - 12/18/22 1351     Visit Number 54    Number of Visits 73    Date for PT Re-Evaluation 01/09/23    Authorization Type [employer] MEDICARE/Medicaid reporting period from 10/17/2022    Progress Note Due on Visit 50    PT Start Time 1346    PT Stop Time 1425    PT Time Calculation (min) 39 min    Equipment Utilized During Treatment --    Activity Tolerance Patient tolerated treatment well    Behavior During Therapy WFL for tasks assessed/performed              Past Medical History:  Diagnosis Date   Alcohol abuse    Anxiety    Anxiety    Anxiety and depression    Asthma    Bipolar disorder ([name])    Borderline personality disorder ([name])    Depression    Diabetes ([name])    Type 2    Dyslipidemia    Esophageal varices ([name])    Foot drop    right   [name] ([name])    GERD (gastroesophageal reflux disease)    Gout    History of drug abuse ([name])    Insomnia    Muscular dystrophies ([name])    Neuropathy    Pancreatitis    Recovering alcoholic ([name])    Past Surgical History:  Procedure Laterality Date   ESOPHAGOGASTRODUODENOSCOPY N/A 04/25/2022   Procedure: ESOPHAGOGASTRODUODENOSCOPY (EGD);  Surgeon: [name], MD;  Location: [facility];  Service: Gastroenterology;  Laterality: N/A;   HERNIA REPAIR     IR FLUORO GUIDE CV LINE RIGHT  12/30/2020   IR REMOVAL TUN CV CATH W/O FL  01/19/2021   IR US GUIDE VASC ACCESS RIGHT  12/30/2020   Patient Active Problem List   Diagnosis Date Noted   Gait abnormality 05/24/2022   Right hand weakness 05/24/2022   Frequent falls 02/12/2021   Leukocytosis    Aspiration pneumonia of both lower lobes due to gastric secretions ([name]) 01/04/2021   Pressure injury  of skin 12/24/2020   Acute hepatic encephalopathy ([name]) 12/20/2020   Sepsis ([name]) 12/19/2020   Acute hyponatremia 12/18/2020   Alcohol use disorder, moderate, dependence ([name]) 12/18/2020   Hepatic steatosis 12/18/2020   Polysubstance abuse ([name]) 12/18/2020   Hyperglycemia due to type 2 diabetes mellitus ([name]) 12/18/2020   Hyperbilirubinemia 12/18/2020   Lactic acidosis 12/18/2020   Alcoholic liver disease ([name]) 12/18/2020   Bipolar 1 disorder, manic, moderate ([name]) 09/10/2018   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   Diabetes ([name]) 09/10/2018   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   Chronic gout of foot 09/29/2015   Muscular dystrophy ([name]) 09/29/2015   Type 2 diabetes mellitus without complication ([name]) 09/29/2015   Anxiety and depression 10/01/2012   Protein-calorie malnutrition, severe ([name]) 09/30/2012   Trichomonas infection 09/26/2012   Dehydration 09/26/2012   Hypokalemia 11/14/2011   Thrombocytopenia ([name]) 11/14/2011   Bipolar 2 disorder ([name]) 11/13/2011   Gout 11/13/2011     REFERRING DIAG: bilateral leg weakness  THERAPY DIAG:  Other abnormalities of gait and mobility  Muscle weakness (generalized)  History of falling  Rationale for Evaluation and Treatment Rehabilitation  PERTINENT HISTORY: Patient is a 47 y.o. female who presents to  outpatient physical therapy with a referral for medical diagnosis bilateral leg weakness. This patient's chief complaints consist of difficulty with weakness and balance with history of frequent falling leading to the following functional deficits: difficulty with basic mobility including ambulation, stairs, transfers; difficulty with ADLs, IADLs. Currently requires assistance to navigate stairs, assistance with ADLs and  IADLs, and ambulates with RW when she previously did not need an assistive device. Relevant past medical history and comorbidities include muscular dystrophy (FSHD - facioscapulohumeral muscular  dystrophy), anxiety and depression, bipolar 1 disorder, borderline personality disorder, diabetes, GERD, gout, pancreatitis, alcoholic liver disease, tobacco abuse,  foot drop, neuropathy, recovering alcoholic, history of drug abuse, history of hernia repair.  Patient denies hx of cancer, stroke, seizures, lung problems, heart problems, unexplained weight loss, unexplained changes in bowel or bladder problems, unexplained stumbling or dropping things, osteoporosis, and spinal surgery.   PRECAUTIONS: Fall, not drink alcohol.  SUBJECTIVE: Patient arrives with rollator. She states her right glute/back is hurting since 2 days ago when she grabbed her dog's collar while in the car and it pulled her shoulder. She did her 1x5 sit to stand's once since last PT session. She states she felt like she made progress because she did it one time.   Agreed her goal for HEP until next PT session is 3x5 sit <> stands 3 times a week.   PAIN: NPRS 5/10 right lower back to right glute.   OBJECTIVE   TODAY'S TREATMENT:  Therapeutic exercise: to centralize symptoms and improve ROM, strength, muscular endurance, and activity tolerance required for successful completion of functional activities.  - NuStep level 2 using bilateral upper and lower extremities. Seat/handle setting 10/10. For improved extremity mobility, muscular endurance, and activity tolerance; and to induce the analgesic effect of aerobic exercise, stimulate improved joint nutrition, and prepare body structures and systems for following interventions. x 6  minutes. Average SPM = 70. - seated leg press, seat position 1, 3x10 at 55#, double leg stance.  - sled push three sets of 2x31ft (sled only, 75lb)   - standing double leg heel raise with B UE support, 3x10.  - forwards step up, 1x10 L side and 1x6 right side with B UE support, 8.5 inch step. (Discontinued due to light-headedness).   Pt required multimodal cuing for proper technique and to facilitate  improved neuromuscular control, strength, range of motion, and functional ability resulting in improved performance and form.   PATIENT EDUCATION:  Education details: Exercise purpose/form. Self management techniques. Education on diagnosis, prognosis, POC, anatomy and physiology of current condition. Dry needling.  Person educated: Patient Education method: Explanation, demonstration, verbal cuing, tactile cuing. Education comprehension: verbalized understanding, demonstrated understanding, and needs further education      HOME EXERCISE PROGRAM: Access Code: [email or web address] URL: [email or web address] Date: 08/20/2022 Prepared by: Norton Blizzard  Exercises - Sit to Stand Without Arm Support  - 3 x weekly - 3 sets - 5 reps - Seated Toe Raise  - 3 x weekly - 3 sets - 6 reps - 1 second hold - Supine Lower Trunk Rotation  - 3 x weekly - 1 sets - 20 reps - 1-5 seconds hold - Prone Press Up  - 3 x weekly - 2 sets - 5 reps    ASSESSMENT:   CLINICAL IMPRESSION:  Patient arrives reporting right glute and back pain after her dog pulled her. Today's session continued to focus on functional/LE strengthening and balance. Patient continues to be appropriately challenged and requires frequent seated breaks between  sets. Patient reports no worsening of pain during session.She did have trouble with lightheadedness by the end of session that improved with rest. Patient would benefit from continued management of limiting condition by skilled physical therapist to address remaining impairments and functional limitations to work towards stated goals and return to PLOF or maximal functional independence.   OBJECTIVE IMPAIRMENTS Abnormal gait, decreased activity tolerance, decreased balance, decreased coordination, decreased endurance, decreased mobility, difficulty walking, decreased ROM, decreased strength, impaired perceived functional ability, increased muscle spasms, impaired tone, impaired UE  functional use, improper body mechanics, postural dysfunction, and pain.    ACTIVITY LIMITATIONS cleaning, community activity, driving, meal prep, laundry, and shopping.    PERSONAL FACTORS Behavior pattern, Fitness, Past/current experiences, Time since onset of injury/illness/exacerbation, Transportation, and 3+ comorbidities:    muscular dystrophy (FSHD - facioscapulohumeral muscular dystrophy), anxiety and depression, bipolar 1 disorder, borderline personality disorder, diabetes, GERD, gout, pancreatitis, alcoholic liver disease, tobacco abuse,  foot drop, neuropathy, recovering alcoholic, history of drug abuse, history of hernia repair are also affecting patient's functional outcome.      REHAB POTENTIAL: Good   CLINICAL DECISION MAKING: Evolving/moderate complexity   EVALUATION COMPLEXITY: Moderate     GOALS: Goals reviewed with patient? No   SHORT TERM GOALS: Target date: 07/24/2021   Patient will be independent with initial home exercise program for self-management of symptoms. Baseline: Initial HEP to be provided at visit 2 as appropriate (07/10/21); Goal status: Met     LONG TERM GOALS: Target date: 10/02/2021; updated to 12/27/2021 for all unmet goals on 10/04/2021; updated to 03/26/2022 for all unmet goals on 01/01/2022; updated to 06/19/2022 for all unmet goals on 03/27/2022; updated to 08/14/2022 for all unmet goals on 05/22/2022, updated to 11/12/2022 for all unmet goals on 08/20/2022, updated to 01/09/2023 for all unmet goals on 10/17/2022.    Patient will be independent with a long-term home exercise program for self-management of symptoms.  Baseline: Initial HEP to be provided at visit 2 as appropriate (07/10/21); participating as tolerated, recently too much pain to participate (09/04/2021); participating regularly (10/04/2021); participating as able (11/21/2021); not participating regularly over last couple of weeks (01/01/2022); no participating regularly since last progress note  (02/15/2022); improving participation (03/27/2022); has not last two weeks due to recent fall (05/17/22); has not been participating (08/20/2022); patient is participating in HEP 2x a week (10/17/2022);  Goal status:  In-progress   2.  Patient will demonstrate improved FOTO score by 10 points from baseline to demonstrate improvement in overall condition and self-reported functional ability.  Baseline: to be measured visit 2 as appropriate (07/10/21); 46 at visit # 2 (07/17/2021); 50 at visit # 10 (09/04/2021); 53 at visit #15 (10/04/2021); 50 at visit # 20 (11/21/2021); 52 at visit # 26 (01/01/2022); 50 at visit #30 (02/15/2022); 54 at visit # 34 (03/27/22);  53 (05/17/22); 51 at visit #44 (08/20/2022); 58 at visit #50 (10/17/2022);  Goal status: In-Progress   3.  Patient will ambulate equal or greater than 1000 feet during 6 Min Walk Test using [facility] or less restrictive assistive device to improve community ambulation.  Baseline: To be tested visit 2 as appropriate (07/10/21); 310 feet with RW (07/17/2021); 556 feet with rollator (09/04/2021); 600 feet with rollator (10/04/2021); 300 feet with rollator - had to stop test at 3 min due to intolerable right glute pain (11/21/2021); 420 feet with rollator and steppage gait, R > L. Stopped at 5 min due to feeling of instability in low back to mid thoracic  spine (01/01/2022); 573 feet with rollator and steppage gait, R > L. Occasional standing breaks. Low back pain increased to 3.5/10 (02/15/2022);  381 feet with rollator and steppage gait, R > L. Stopped at 4:30 min due to pain in low back and right glute (03/27/2022); 124' in 1 minute 22 sec. Had to discontinue due to R back/hip pain (05/17/2022); 200 feet with rollator, discontinued due to low back/glute pain (08/20/2022); 500 feet with rollator (10/17/2022);  Goal status: In-Progress   4.  Patient will complete 5 Time Sit To Stand test in equal or less than 20 seconds with no UE support from 18.5 inch plinth to demonstrate improved LE  functional strength and power for basic mobility and improved fall risk.  Baseline: 24 seconds with B UE on 18.5 inch plinth.  (07/10/21); 20 seconds with B UE on thighs from 18.5 inch plinth (09/04/2021); 19 seconds with B UE on thighs from 18.5 inch plinth (10/04/2021); 22 seconds with B UE on thighs from 18.5 inch plinth (11/21/2021); 20 seconds with B UE on thighs from 18.5 inch plinth (01/01/2022); 19 seconds without use of B UE support (02/15/2022); 21 seconds with no UE support from 18.5 inch plinth (03/27/2022); 20.5 seconds no UE support and 18" surface (05/17/2022); 20 seconds with no UE support from 18.5 inch plinth (08/20/2022);  Goal status: in progress     5.  Patient be able to ascend and descend stairs at her home mod I with use of B handrails.  Baseline: able to ascend/descend 4 steps in the clinic with B UE support on handrails and supervision - reports currently needs assistance to descend steps at home. (07/10/21); able to ascend/descend 4 steps in the clinic with B UE support on handrails and supervision, leading up and down with left. able to perform with reversed LE leads (09/04/2021); able to ascend/descend 4 steps in the clinic with B UE support on handrails and supervision, step over step with heavy UE use on the way down (10/04/2021); able to ascend/descend 4 steps in the clinic with B UE support on handrails and supervision, step over step up and step to leading with R LE with heavy UE use on the way down (11/21/2021); ascend/descends 4 steps in the clinic with B UE support on handrails and supervision, step over step up and down with heavy UE use on the way down (01/01/2022); able to ascend/descend 4 steps in the clinic with B UE support on handrails and supervision, step over step up and down with heavy UE use on the way down. leads up with right foot step to gait (02/15/2022; 03/27/22); able to ascend/descend 4 steps in the clinic with B UE support on handrails and supervision, step over step  up and down with moderately heavy UE use on the way down (08/20/2022); able to ascend/descend 4 steps in the clinic with B UE support on handrails and supervision, step over step up and down with UE use on the way down (10/17/2022);  Goal status: MET   6.  Patient will demonstrate ability to perform static balance in tandem stance for equal or greater than 30 seconds on each side, to demonstrate improved balance and decreased fall risk.  Baseline: right foot front 10 seconds, left foot front 7 seconds (07/10/2021); right foot front 4 seconds, left foot front 6 seconds (09/04/2021); right foot front 9 seconds, left foot front 31 seconds (10/04/2021); right foot front 6.5 seconds, left foot front 8.5 seconds (11/21/2021); right foot front 8 seconds, left  foot front 7 seconds (01/01/2022);  right foot front 9 seconds, left foot front 20 seconds (02/15/2022); right foot front 30 seconds, left foot front 45 seconds (03/27/2022); right foot front 16 seconds, left foot front 17 seconds (08/20/2022); right foot front 26 seconds, left foot front 43 seconds (10/17/2022);  Goal status: MET 03/27/22     PLAN: PT FREQUENCY: 2x/week   PT DURATION: 12 weeks   PLANNED INTERVENTIONS: Therapeutic exercises, Therapeutic activity, Neuromuscular re-education, Balance training, Gait training, Patient/Family education, Joint mobilization, Orthotic/Fit training, DME instructions, Dry Needling, Electrical stimulation, Cryotherapy, Moist heat, Splintting, Taping, and Manual therapy   PLAN FOR NEXT SESSION: continue with moderate intensity functional strengthening and balance exercises as able, address back and right glute pain to allow return to functional strengthening and balance with better tolerance.     [name]. Ilsa Iha, PT, DPT 12/18/22, 2:29 PM  [facility] [address] [place], [place] [address] P: [phone] I F: [phone]

## 2022-12-20 DIAGNOSIS — J309 Allergic rhinitis, unspecified: Secondary | ICD-10-CM | POA: Diagnosis not present

## 2022-12-20 DIAGNOSIS — K7460 Unspecified cirrhosis of liver: Secondary | ICD-10-CM | POA: Diagnosis not present

## 2022-12-20 DIAGNOSIS — J449 Chronic obstructive pulmonary disease, unspecified: Secondary | ICD-10-CM | POA: Diagnosis not present

## 2022-12-20 DIAGNOSIS — Z23 Encounter for immunization: Secondary | ICD-10-CM | POA: Diagnosis not present

## 2022-12-20 DIAGNOSIS — E1142 Type 2 diabetes mellitus with diabetic polyneuropathy: Secondary | ICD-10-CM | POA: Diagnosis not present

## 2022-12-20 DIAGNOSIS — G629 Polyneuropathy, unspecified: Secondary | ICD-10-CM | POA: Diagnosis not present

## 2022-12-20 DIAGNOSIS — M109 Gout, unspecified: Secondary | ICD-10-CM | POA: Diagnosis not present

## 2022-12-20 DIAGNOSIS — E785 Hyperlipidemia, unspecified: Secondary | ICD-10-CM | POA: Diagnosis not present

## 2022-12-20 DIAGNOSIS — K219 Gastro-esophageal reflux disease without esophagitis: Secondary | ICD-10-CM | POA: Diagnosis not present

## 2022-12-20 DIAGNOSIS — N3281 Overactive bladder: Secondary | ICD-10-CM | POA: Diagnosis not present

## 2022-12-20 DIAGNOSIS — K766 Portal hypertension: Secondary | ICD-10-CM | POA: Diagnosis not present

## 2022-12-20 DIAGNOSIS — Z79899 Other long term (current) drug therapy: Secondary | ICD-10-CM | POA: Diagnosis not present

## 2022-12-24 ENCOUNTER — Ambulatory Visit: Payer: 59 | Admitting: Physical Therapy

## 2022-12-24 ENCOUNTER — Encounter: Payer: Self-pay | Admitting: Physical Therapy

## 2022-12-24 DIAGNOSIS — M6281 Muscle weakness (generalized): Secondary | ICD-10-CM

## 2022-12-24 DIAGNOSIS — Z9181 History of falling: Secondary | ICD-10-CM | POA: Diagnosis not present

## 2022-12-24 DIAGNOSIS — R2689 Other abnormalities of gait and mobility: Secondary | ICD-10-CM | POA: Diagnosis not present

## 2022-12-24 NOTE — Therapy (Signed)
OUTPATIENT PHYSICAL THERAPY TREATMENT   Patient Name: [name] MRN: [id number] DOB:10/31/1975, 47 y.o., female Today's Date: 12/24/2022  PCP: Eunice Blase, PA-C REFERRING PROVIDER: Eunice Blase, PA-C  END OF SESSION:   PT End of Session - 12/24/22 1437     Visit Number 55    Number of Visits 73    Date for PT Re-Evaluation 01/09/23    Authorization Type [employer] MEDICARE/Medicaid reporting period from 10/17/2022    Progress Note Due on Visit 50    PT Start Time 1430    PT Stop Time 1510    PT Time Calculation (min) 40 min    Activity Tolerance Patient tolerated treatment well    Behavior During Therapy WFL for tasks assessed/performed               Past Medical History:  Diagnosis Date   Alcohol abuse    Anxiety    Anxiety    Anxiety and depression    Asthma    Bipolar disorder ([name])    Borderline personality disorder ([name])    Depression    Diabetes ([name])    Type 2    Dyslipidemia    Esophageal varices ([name])    Foot drop    right   [name] ([name])    GERD (gastroesophageal reflux disease)    Gout    History of drug abuse ([name])    Insomnia    Muscular dystrophies ([name])    Neuropathy    Pancreatitis    Recovering alcoholic ([name])    Past Surgical History:  Procedure Laterality Date   ESOPHAGOGASTRODUODENOSCOPY N/A 04/25/2022   Procedure: ESOPHAGOGASTRODUODENOSCOPY (EGD);  Surgeon: [name], MD;  Location: [facility] ENDOSCOPY;  Service: Gastroenterology;  Laterality: N/A;   HERNIA REPAIR     IR FLUORO GUIDE CV LINE RIGHT  12/30/2020   IR REMOVAL TUN CV CATH W/O FL  01/19/2021   IR US GUIDE VASC ACCESS RIGHT  12/30/2020   Patient Active Problem List   Diagnosis Date Noted   Gait abnormality 05/24/2022   Right hand weakness 05/24/2022   Frequent falls 02/12/2021   Leukocytosis    Aspiration pneumonia of both lower lobes due to gastric secretions ([name]) 01/04/2021   Pressure injury of skin 12/24/2020   Acute hepatic  encephalopathy ([name]) 12/20/2020   Sepsis ([name]) 12/19/2020   Acute hyponatremia 12/18/2020   Alcohol use disorder, moderate, dependence ([name]) 12/18/2020   Hepatic steatosis 12/18/2020   Polysubstance abuse ([name]) 12/18/2020   Hyperglycemia due to type 2 diabetes mellitus ([name]) 12/18/2020   Hyperbilirubinemia 12/18/2020   Lactic acidosis 12/18/2020   Alcoholic liver disease ([name]) 12/18/2020   Bipolar 1 disorder, manic, moderate ([name]) 09/10/2018   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   Diabetes ([name]) 09/10/2018   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   Chronic gout of foot 09/29/2015   Muscular dystrophy ([name]) 09/29/2015   Type 2 diabetes mellitus without complication ([name]) 09/29/2015   Anxiety and depression 10/01/2012   Protein-calorie malnutrition, severe ([name]) 09/30/2012   Trichomonas infection 09/26/2012   Dehydration 09/26/2012   Hypokalemia 11/14/2011   Thrombocytopenia ([name]) 11/14/2011   Bipolar 2 disorder ([name]) 11/13/2011   Gout 11/13/2011     REFERRING DIAG: bilateral leg weakness  THERAPY DIAG:  Other abnormalities of gait and mobility  Muscle weakness (generalized)  History of falling  Rationale for Evaluation and Treatment Rehabilitation  PERTINENT HISTORY: Patient is a 47 y.o. female who presents to outpatient physical therapy with a referral for  medical diagnosis bilateral leg weakness. This patient's chief complaints consist of difficulty with weakness and balance with history of frequent falling leading to the following functional deficits: difficulty with basic mobility including ambulation, stairs, transfers; difficulty with ADLs, IADLs. Currently requires assistance to navigate stairs, assistance with ADLs and  IADLs, and ambulates with RW when she previously did not need an assistive device. Relevant past medical history and comorbidities include muscular dystrophy (FSHD - facioscapulohumeral muscular dystrophy), anxiety and depression,  bipolar 1 disorder, borderline personality disorder, diabetes, GERD, gout, pancreatitis, alcoholic liver disease, tobacco abuse,  foot drop, neuropathy, recovering alcoholic, history of drug abuse, history of hernia repair.  Patient denies hx of cancer, stroke, seizures, lung problems, heart problems, unexplained weight loss, unexplained changes in bowel or bladder problems, unexplained stumbling or dropping things, osteoporosis, and spinal surgery.   PRECAUTIONS: Fall, not drink alcohol.  SUBJECTIVE: Patient arrives with rollator. She states she is having a lot of pain and thinks she hurt herself on Friday. Her cousin who has been doing most of the heavy work at home, but her condition is getting worse more quickly and she is no longer able to do this work. Patient took out a lot of trash to the dumpster yesterday. She fell last Wednesday as well. She slipped on the floor. She landed on her right knee which didn't feel great. She was sore before that. She was tuckered out when she left PT last Tuesday and went to bed early that day. She states the soreness she relates to the work Friday started when she woke up Saturday morning and has lasted all weekend. It is less sore today.  She had this soreness in both of her shoulder region. She states her current pain is in the lower back and right glute. She requests to work on pain control. Her A1c went down from 6.6 to 6.4. She did not do her HEP this week. She would like to do the leg press and the sled today. She changed from methocarbamol to baclofen and has been taking baclofen since Thursday.   Agreed her goal for HEP until next PT session is 3x5 sit <> stands 3 times a week.   PAIN: NPRS 6/10 right lower back to right glute.   OBJECTIVE   TODAY'S TREATMENT:  Manual therapy: to reduce pain and tissue tension, improve range of motion, neuromodulation, in order to promote improved ability to complete functional activities. PRONE - STM to bilateral  lumbar paraspinals and right  and left posterior hip musculature including gludes and deep hip rotators.    Modality: (unbilled) Dry needling performed to low back and right piriformis to decrease pain and spasms along patient's low back and right glute region with patient in prone utilizing  dry needle(s) .30mm x with 1 stick each level  each side at lumbar multifidi at L4 and L5  and 1 stick at right and left piriformis. Patient educated about the risks and benefits from therapy and verbally consents to treatment and draped appropriately. Dry needling performed by [name]. Ilsa Iha PT, DPT who is certified in this technique.   Therapeutic exercise: to centralize symptoms and improve ROM, strength, muscular endurance, and activity tolerance required for successful completion of functional activities.  - seated leg press, seat position 1, 4x10 at 55#, double leg stance.   Pt required multimodal cuing for proper technique and to facilitate improved neuromuscular control, strength, range of motion, and functional ability resulting in improved performance and form.  PATIENT EDUCATION:  Education details: Exercise purpose/form. Self management techniques. Education on diagnosis, prognosis, POC, anatomy and physiology of current condition. Dry needling.  Person educated: Patient Education method: Explanation, demonstration, verbal cuing, tactile cuing. Education comprehension: verbalized understanding, demonstrated understanding, and needs further education      HOME EXERCISE PROGRAM: Access Code: [email or web address] URL: [email or web address] Date: 08/20/2022 Prepared by: Norton Blizzard  Exercises - Sit to Stand Without Arm Support  - 3 x weekly - 3 sets - 5 reps - Seated Toe Raise  - 3 x weekly - 3 sets - 6 reps - 1 second hold - Supine Lower Trunk Rotation  - 3 x weekly - 1 sets - 20 reps - 1-5 seconds hold - Prone Press Up  - 3 x weekly - 2 sets - 5 reps    ASSESSMENT:    CLINICAL IMPRESSION:  Patient arrives reporting increased pain in low back and right glute after increased work load at home. Today's session focused on manual therapy and dry needling for pain control and continued LE and functional strengthening. Patient reported feeling better following dry needling and manual therapy and really likes the leg press. Patient would benefit from continued management of limiting condition by skilled physical therapist to address remaining impairments and functional limitations to work towards stated goals and return to PLOF or maximal functional independence.   OBJECTIVE IMPAIRMENTS Abnormal gait, decreased activity tolerance, decreased balance, decreased coordination, decreased endurance, decreased mobility, difficulty walking, decreased ROM, decreased strength, impaired perceived functional ability, increased muscle spasms, impaired tone, impaired UE functional use, improper body mechanics, postural dysfunction, and pain.    ACTIVITY LIMITATIONS cleaning, community activity, driving, meal prep, laundry, and shopping.    PERSONAL FACTORS Behavior pattern, Fitness, Past/current experiences, Time since onset of injury/illness/exacerbation, Transportation, and 3+ comorbidities:    muscular dystrophy (FSHD - facioscapulohumeral muscular dystrophy), anxiety and depression, bipolar 1 disorder, borderline personality disorder, diabetes, GERD, gout, pancreatitis, alcoholic liver disease, tobacco abuse,  foot drop, neuropathy, recovering alcoholic, history of drug abuse, history of hernia repair are also affecting patient's functional outcome.      REHAB POTENTIAL: Good   CLINICAL DECISION MAKING: Evolving/moderate complexity   EVALUATION COMPLEXITY: Moderate     GOALS: Goals reviewed with patient? No   SHORT TERM GOALS: Target date: 07/24/2021   Patient will be independent with initial home exercise program for self-management of symptoms. Baseline: Initial HEP to be  provided at visit 2 as appropriate (07/10/21); Goal status: Met     LONG TERM GOALS: Target date: 10/02/2021; updated to 12/27/2021 for all unmet goals on 10/04/2021; updated to 03/26/2022 for all unmet goals on 01/01/2022; updated to 06/19/2022 for all unmet goals on 03/27/2022; updated to 08/14/2022 for all unmet goals on 05/22/2022, updated to 11/12/2022 for all unmet goals on 08/20/2022, updated to 01/09/2023 for all unmet goals on 10/17/2022.    Patient will be independent with a long-term home exercise program for self-management of symptoms.  Baseline: Initial HEP to be provided at visit 2 as appropriate (07/10/21); participating as tolerated, recently too much pain to participate (09/04/2021); participating regularly (10/04/2021); participating as able (11/21/2021); not participating regularly over last couple of weeks (01/01/2022); no participating regularly since last progress note (02/15/2022); improving participation (03/27/2022); has not last two weeks due to recent fall (05/17/22); has not been participating (08/20/2022); patient is participating in HEP 2x a week (10/17/2022);  Goal status:  In-progress   2.  Patient will demonstrate improved FOTO score by 10 points from baseline  to demonstrate improvement in overall condition and self-reported functional ability.  Baseline: to be measured visit 2 as appropriate (07/10/21); 47 at visit # 2 (07/17/2021); 50 at visit # 10 (09/04/2021); 53 at visit #15 (10/04/2021); 50 at visit # 20 (11/21/2021); 52 at visit # 26 (01/01/2022); 50 at visit #30 (02/15/2022); 54 at visit # 34 (03/27/22);  53 (05/17/22); 51 at visit #44 (08/20/2022); 58 at visit #50 (10/17/2022);  Goal status: In-Progress   3.  Patient will ambulate equal or greater than 1000 feet during 6 Min Walk Test using [facility] or less restrictive assistive device to improve community ambulation.  Baseline: To be tested visit 2 as appropriate (07/10/21); 310 feet with RW (07/17/2021); 556 feet with rollator (09/04/2021); 600 feet  with rollator (10/04/2021); 300 feet with rollator - had to stop test at 3 min due to intolerable right glute pain (11/21/2021); 420 feet with rollator and steppage gait, R > L. Stopped at 5 min due to feeling of instability in low back to mid thoracic spine (01/01/2022); 573 feet with rollator and steppage gait, R > L. Occasional standing breaks. Low back pain increased to 3.5/10 (02/15/2022);  381 feet with rollator and steppage gait, R > L. Stopped at 4:30 min due to pain in low back and right glute (03/27/2022); 124' in 1 minute 22 sec. Had to discontinue due to R back/hip pain (05/17/2022); 200 feet with rollator, discontinued due to low back/glute pain (08/20/2022); 500 feet with rollator (10/17/2022);  Goal status: In-Progress   4.  Patient will complete 5 Time Sit To Stand test in equal or less than 20 seconds with no UE support from 18.5 inch plinth to demonstrate improved LE functional strength and power for basic mobility and improved fall risk.  Baseline: 24 seconds with B UE on 18.5 inch plinth.  (07/10/21); 20 seconds with B UE on thighs from 18.5 inch plinth (09/04/2021); 19 seconds with B UE on thighs from 18.5 inch plinth (10/04/2021); 22 seconds with B UE on thighs from 18.5 inch plinth (11/21/2021); 20 seconds with B UE on thighs from 18.5 inch plinth (01/01/2022); 19 seconds without use of B UE support (02/15/2022); 21 seconds with no UE support from 18.5 inch plinth (03/27/2022); 20.5 seconds no UE support and 18" surface (05/17/2022); 20 seconds with no UE support from 18.5 inch plinth (08/20/2022);  Goal status: in progress     5.  Patient be able to ascend and descend stairs at her home mod I with use of B handrails.  Baseline: able to ascend/descend 4 steps in the clinic with B UE support on handrails and supervision - reports currently needs assistance to descend steps at home. (07/10/21); able to ascend/descend 4 steps in the clinic with B UE support on handrails and supervision, leading up and  down with left. able to perform with reversed LE leads (09/04/2021); able to ascend/descend 4 steps in the clinic with B UE support on handrails and supervision, step over step with heavy UE use on the way down (10/04/2021); able to ascend/descend 4 steps in the clinic with B UE support on handrails and supervision, step over step up and step to leading with R LE with heavy UE use on the way down (11/21/2021); ascend/descends 4 steps in the clinic with B UE support on handrails and supervision, step over step up and down with heavy UE use on the way down (01/01/2022); able to ascend/descend 4 steps in the clinic with B UE support on handrails and supervision,  step over step up and down with heavy UE use on the way down. leads up with right foot step to gait (02/15/2022; 03/27/22); able to ascend/descend 4 steps in the clinic with B UE support on handrails and supervision, step over step up and down with moderately heavy UE use on the way down (08/20/2022); able to ascend/descend 4 steps in the clinic with B UE support on handrails and supervision, step over step up and down with UE use on the way down (10/17/2022);  Goal status: MET   6.  Patient will demonstrate ability to perform static balance in tandem stance for equal or greater than 30 seconds on each side, to demonstrate improved balance and decreased fall risk.  Baseline: right foot front 10 seconds, left foot front 7 seconds (07/10/2021); right foot front 4 seconds, left foot front 6 seconds (09/04/2021); right foot front 9 seconds, left foot front 31 seconds (10/04/2021); right foot front 6.5 seconds, left foot front 8.5 seconds (11/21/2021); right foot front 8 seconds, left foot front 7 seconds (01/01/2022);  right foot front 9 seconds, left foot front 20 seconds (02/15/2022); right foot front 30 seconds, left foot front 45 seconds (03/27/2022); right foot front 16 seconds, left foot front 17 seconds (08/20/2022); right foot front 26 seconds, left foot front 43 seconds  (10/17/2022);  Goal status: MET 03/27/22     PLAN: PT FREQUENCY: 2x/week   PT DURATION: 12 weeks   PLANNED INTERVENTIONS: Therapeutic exercises, Therapeutic activity, Neuromuscular re-education, Balance training, Gait training, Patient/Family education, Joint mobilization, Orthotic/Fit training, DME instructions, Dry Needling, Electrical stimulation, Cryotherapy, Moist heat, Splintting, Taping, and Manual therapy   PLAN FOR NEXT SESSION: continue with moderate intensity functional strengthening and balance exercises as able, address back and right glute pain to allow return to functional strengthening and balance with better tolerance.     [name]. Ilsa Iha, PT, DPT 12/24/22, 7:43 PM  [facility] [address] [place], [place] [address] P: [phone] I F: [phone]

## 2022-12-25 ENCOUNTER — Ambulatory Visit: Payer: 59 | Admitting: Physical Therapy

## 2023-01-01 ENCOUNTER — Ambulatory Visit: Payer: 59 | Admitting: Physical Therapy

## 2023-01-02 ENCOUNTER — Encounter: Payer: 59 | Admitting: Physical Therapy

## 2023-01-03 ENCOUNTER — Ambulatory Visit: Payer: 59 | Admitting: Physical Therapy

## 2023-01-03 ENCOUNTER — Encounter: Payer: Self-pay | Admitting: Physical Therapy

## 2023-01-03 DIAGNOSIS — Z9181 History of falling: Secondary | ICD-10-CM | POA: Diagnosis not present

## 2023-01-03 DIAGNOSIS — R2689 Other abnormalities of gait and mobility: Secondary | ICD-10-CM

## 2023-01-03 DIAGNOSIS — M6281 Muscle weakness (generalized): Secondary | ICD-10-CM | POA: Diagnosis not present

## 2023-01-03 NOTE — Therapy (Signed)
OUTPATIENT PHYSICAL THERAPY TREATMENT   Patient Name: [name] MRN: [id number] DOB:[birth year], 47 y.o., female Today's Date: 01/03/2023  PCP: Eunice Blase, PA-C REFERRING PROVIDER: Eunice Blase, PA-C  END OF SESSION:   PT End of Session - 01/03/23 1657     Visit Number 56    Number of Visits 73    Date for PT Re-Evaluation 01/09/23    Authorization Type [employer] MEDICARE/Medicaid reporting period from 10/17/2022    Progress Note Due on Visit 50    PT Start Time 1655    PT Stop Time 1735    PT Time Calculation (min) 40 min    Activity Tolerance Patient tolerated treatment well    Behavior During Therapy WFL for tasks assessed/performed                Past Medical History:  Diagnosis Date   Alcohol abuse    Anxiety    Anxiety    Anxiety and depression    Asthma    Bipolar disorder ([name])    Borderline personality disorder ([name])    Depression    Diabetes ([name])    Type 2    Dyslipidemia    Esophageal varices ([name])    Foot drop    right   [name] ([name])    GERD (gastroesophageal reflux disease)    Gout    History of drug abuse ([name])    Insomnia    Muscular dystrophies ([name])    Neuropathy    Pancreatitis    Recovering alcoholic ([name])    Past Surgical History:  Procedure Laterality Date   ESOPHAGOGASTRODUODENOSCOPY N/A 04/25/2022   Procedure: ESOPHAGOGASTRODUODENOSCOPY (EGD);  Surgeon: [name], MD;  Location: [facility] ENDOSCOPY;  Service: Gastroenterology;  Laterality: N/A;   HERNIA REPAIR     IR FLUORO GUIDE CV LINE RIGHT  12/30/2020   IR REMOVAL TUN CV CATH W/O FL  01/19/2021   IR US GUIDE VASC ACCESS RIGHT  12/30/2020   Patient Active Problem List   Diagnosis Date Noted   Gait abnormality 05/24/2022   Right hand weakness 05/24/2022   Frequent falls 02/12/2021   Leukocytosis    Aspiration pneumonia of both lower lobes due to gastric secretions ([name]) 01/04/2021   Pressure injury of skin 12/24/2020   Acute hepatic  encephalopathy ([name]) 12/20/2020   Sepsis ([name]) 12/19/2020   Acute hyponatremia 12/18/2020   Alcohol use disorder, moderate, dependence ([name]) 12/18/2020   Hepatic steatosis 12/18/2020   Polysubstance abuse ([name]) 12/18/2020   Hyperglycemia due to type 2 diabetes mellitus ([name]) 12/18/2020   Hyperbilirubinemia 12/18/2020   Lactic acidosis 12/18/2020   Alcoholic liver disease ([name]) 12/18/2020   Bipolar 1 disorder, manic, moderate ([name]) 09/10/2018   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   Diabetes ([name]) 09/10/2018   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   Chronic gout of foot 09/29/2015   Muscular dystrophy ([name]) 09/29/2015   Type 2 diabetes mellitus without complication ([name]) 09/29/2015   Anxiety and depression 10/01/2012   Protein-calorie malnutrition, severe ([name]) 09/30/2012   Trichomonas infection 09/26/2012   Dehydration 09/26/2012   Hypokalemia 11/14/2011   Thrombocytopenia ([name]) 11/14/2011   Bipolar 2 disorder ([name]) 11/13/2011   Gout 11/13/2011     REFERRING DIAG: bilateral leg weakness  THERAPY DIAG:  Other abnormalities of gait and mobility  Muscle weakness (generalized)  History of falling  Rationale for Evaluation and Treatment Rehabilitation  PERTINENT HISTORY: Patient is a 47 y.o. female who presents to outpatient physical therapy with a referral  for medical diagnosis bilateral leg weakness. This patient's chief complaints consist of difficulty with weakness and balance with history of frequent falling leading to the following functional deficits: difficulty with basic mobility including ambulation, stairs, transfers; difficulty with ADLs, IADLs. Currently requires assistance to navigate stairs, assistance with ADLs and  IADLs, and ambulates with RW when she previously did not need an assistive device. Relevant past medical history and comorbidities include muscular dystrophy (FSHD - facioscapulohumeral muscular dystrophy), anxiety and depression,  bipolar 1 disorder, borderline personality disorder, diabetes, GERD, gout, pancreatitis, alcoholic liver disease, tobacco abuse,  foot drop, neuropathy, recovering alcoholic, history of drug abuse, history of hernia repair.  Patient denies hx of cancer, stroke, seizures, lung problems, heart problems, unexplained weight loss, unexplained changes in bowel or bladder problems, unexplained stumbling or dropping things, osteoporosis, and spinal surgery.   PRECAUTIONS: Fall, not drink alcohol.  SUBJECTIVE: Patient arrives with rollator. She states the spot in her right buttocks is really hurting and she would like to work on pain control, leg press, and sled push because she feels that those things really help her. She states she has been doing her HEP. She reports she had improved pain for 3-4 days after last PT session.   Agreed her goal for HEP until next PT session is 3x5 sit <> stands 3 times a week.   PAIN: NPRS 5/10 right lower back to right glute.   OBJECTIVE   TODAY'S TREATMENT:  Manual therapy: to reduce pain and tissue tension, improve range of motion, neuromodulation, in order to promote improved ability to complete functional activities. PRONE - STM to bilateral lumbar paraspinals and right posterior hip musculature including glutes and deep hip rotators.   Modality: (unbilled) Dry needling performed to low back and right piriformis to decrease pain and spasms along patient's low back and right glute region with patient in prone utilizing 2 dry needle(s) .30mm x with 1 stick each level  each side at lumbar multifidi at L4 and L5, 1 stick at right piriformis and 1 stick just lateral to right SIJ. Patient educated about the risks and benefits from therapy and verbally consents to treatment and draped appropriately. Dry needling performed by [name]. Ilsa Iha PT, DPT who is certified in this technique.  Therapeutic exercise: to centralize symptoms and improve ROM, strength, muscular  endurance, and activity tolerance required for successful completion of functional activities.  - seated leg press, seat position 1, 1x10 at 55#,  3x10 at 65# double leg stance.  - sled push three sets of 2x29ft (sled only, 75lb)   - NuStep level 1 using bilateral upper and lower extremities. Seat/handle setting 10/10. For improved extremity mobility, muscular endurance, and activity tolerance; and to induce the analgesic effect of aerobic exercise, stimulate improved joint nutrition. 3 minutes. Average SPM = 70.    Pt required multimodal cuing for proper technique and to facilitate improved neuromuscular control, strength, range of motion, and functional ability resulting in improved performance and form.   PATIENT EDUCATION:  Education details: Exercise purpose/form. Self management techniques. Education on diagnosis, prognosis, POC, anatomy and physiology of current condition. Dry needling.  Person educated: Patient Education method: Explanation, demonstration, verbal cuing, tactile cuing. Education comprehension: verbalized understanding, demonstrated understanding, and needs further education      HOME EXERCISE PROGRAM: Access Code: [email or web address] URL: [email or web address] Date: 08/20/2022 Prepared by: Norton Blizzard  Exercises - Sit to Stand Without Arm Support  - 3 x weekly - 3 sets - 5 reps - Seated  Toe Raise  - 3 x weekly - 3 sets - 6 reps - 1 second hold - Supine Lower Trunk Rotation  - 3 x weekly - 1 sets - 20 reps - 1-5 seconds hold - Prone Press Up  - 3 x weekly - 2 sets - 5 reps    ASSESSMENT:   CLINICAL IMPRESSION:  Patient arrives reporting continued difficulty with right glute pain but improved HEP participation. Today's session again utilized dry needling and manual therapy for pain control and continued with functional and LE strengthening and endurance exercises to improve functional mobility, activity tolerance, and quality of life. Patient would benefit  from continued management of limiting condition by skilled physical therapist to address remaining impairments and functional limitations to work towards stated goals and return to PLOF or maximal functional independence.    OBJECTIVE IMPAIRMENTS Abnormal gait, decreased activity tolerance, decreased balance, decreased coordination, decreased endurance, decreased mobility, difficulty walking, decreased ROM, decreased strength, impaired perceived functional ability, increased muscle spasms, impaired tone, impaired UE functional use, improper body mechanics, postural dysfunction, and pain.    ACTIVITY LIMITATIONS cleaning, community activity, driving, meal prep, laundry, and shopping.    PERSONAL FACTORS Behavior pattern, Fitness, Past/current experiences, Time since onset of injury/illness/exacerbation, Transportation, and 3+ comorbidities:    muscular dystrophy (FSHD - facioscapulohumeral muscular dystrophy), anxiety and depression, bipolar 1 disorder, borderline personality disorder, diabetes, GERD, gout, pancreatitis, alcoholic liver disease, tobacco abuse,  foot drop, neuropathy, recovering alcoholic, history of drug abuse, history of hernia repair are also affecting patient's functional outcome.      REHAB POTENTIAL: Good   CLINICAL DECISION MAKING: Evolving/moderate complexity   EVALUATION COMPLEXITY: Moderate     GOALS: Goals reviewed with patient? No   SHORT TERM GOALS: Target date: 07/24/2021   Patient will be independent with initial home exercise program for self-management of symptoms. Baseline: Initial HEP to be provided at visit 2 as appropriate (07/10/21); Goal status: Met     LONG TERM GOALS: Target date: 10/02/2021; updated to 12/27/2021 for all unmet goals on 10/04/2021; updated to 03/26/2022 for all unmet goals on 01/01/2022; updated to 06/19/2022 for all unmet goals on 03/27/2022; updated to 08/14/2022 for all unmet goals on 05/22/2022, updated to 11/12/2022 for all unmet goals on  08/20/2022, updated to 01/09/2023 for all unmet goals on 10/17/2022.    Patient will be independent with a long-term home exercise program for self-management of symptoms.  Baseline: Initial HEP to be provided at visit 2 as appropriate (07/10/21); participating as tolerated, recently too much pain to participate (09/04/2021); participating regularly (10/04/2021); participating as able (11/21/2021); not participating regularly over last couple of weeks (01/01/2022); no participating regularly since last progress note (02/15/2022); improving participation (03/27/2022); has not last two weeks due to recent fall (05/17/22); has not been participating (08/20/2022); patient is participating in HEP 2x a week (10/17/2022);  Goal status:  In-progress   2.  Patient will demonstrate improved FOTO score by 10 points from baseline to demonstrate improvement in overall condition and self-reported functional ability.  Baseline: to be measured visit 2 as appropriate (07/10/21); 47 at visit # 2 (07/17/2021); 50 at visit # 10 (09/04/2021); 53 at visit #15 (10/04/2021); 50 at visit # 20 (11/21/2021); 52 at visit # 26 (01/01/2022); 50 at visit #30 (02/15/2022); 54 at visit # 34 (03/27/22);  53 (05/17/22); 51 at visit #44 (08/20/2022); 58 at visit #50 (10/17/2022);  Goal status: In-Progress   3.  Patient will ambulate equal or greater than 1000 feet during  6 Min Walk Test using SPC or less restrictive assistive device to improve community ambulation.  Baseline: To be tested visit 2 as appropriate (07/10/21); 310 feet with RW (07/17/2021); 556 feet with rollator (09/04/2021); 600 feet with rollator (10/04/2021); 300 feet with rollator - had to stop test at 3 min due to intolerable right glute pain (11/21/2021); 420 feet with rollator and steppage gait, R > L. Stopped at 5 min due to feeling of instability in low back to mid thoracic spine (01/01/2022); 573 feet with rollator and steppage gait, R > L. Occasional standing breaks. Low back pain increased to  3.5/10 (02/15/2022);  381 feet with rollator and steppage gait, R > L. Stopped at 4:30 min due to pain in low back and right glute (03/27/2022); 124' in 1 minute 22 sec. Had to discontinue due to R back/hip pain (05/17/2022); 200 feet with rollator, discontinued due to low back/glute pain (08/20/2022); 500 feet with rollator (10/17/2022);  Goal status: In-Progress   4.  Patient will complete 5 Time Sit To Stand test in equal or less than 20 seconds with no UE support from 18.5 inch plinth to demonstrate improved LE functional strength and power for basic mobility and improved fall risk.  Baseline: 24 seconds with B UE on 18.5 inch plinth.  (07/10/21); 20 seconds with B UE on thighs from 18.5 inch plinth (09/04/2021); 19 seconds with B UE on thighs from 18.5 inch plinth (10/04/2021); 22 seconds with B UE on thighs from 18.5 inch plinth (11/21/2021); 20 seconds with B UE on thighs from 18.5 inch plinth (01/01/2022); 19 seconds without use of B UE support (02/15/2022); 21 seconds with no UE support from 18.5 inch plinth (03/27/2022); 20.5 seconds no UE support and 18" surface (05/17/2022); 20 seconds with no UE support from 18.5 inch plinth (08/20/2022);  Goal status: in progress     5.  Patient be able to ascend and descend stairs at her home mod I with use of B handrails.  Baseline: able to ascend/descend 4 steps in the clinic with B UE support on handrails and supervision - reports currently needs assistance to descend steps at home. (07/10/21); able to ascend/descend 4 steps in the clinic with B UE support on handrails and supervision, leading up and down with left. able to perform with reversed LE leads (09/04/2021); able to ascend/descend 4 steps in the clinic with B UE support on handrails and supervision, step over step with heavy UE use on the way down (10/04/2021); able to ascend/descend 4 steps in the clinic with B UE support on handrails and supervision, step over step up and step to leading with R LE with heavy UE  use on the way down (11/21/2021); ascend/descends 4 steps in the clinic with B UE support on handrails and supervision, step over step up and down with heavy UE use on the way down (01/01/2022); able to ascend/descend 4 steps in the clinic with B UE support on handrails and supervision, step over step up and down with heavy UE use on the way down. leads up with right foot step to gait (02/15/2022; 03/27/22); able to ascend/descend 4 steps in the clinic with B UE support on handrails and supervision, step over step up and down with moderately heavy UE use on the way down (08/20/2022); able to ascend/descend 4 steps in the clinic with B UE support on handrails and supervision, step over step up and down with UE use on the way down (10/17/2022);  Goal status: MET  6.  Patient will demonstrate ability to perform static balance in tandem stance for equal or greater than 30 seconds on each side, to demonstrate improved balance and decreased fall risk.  Baseline: right foot front 10 seconds, left foot front 7 seconds (07/10/2021); right foot front 4 seconds, left foot front 6 seconds (09/04/2021); right foot front 9 seconds, left foot front 31 seconds (10/04/2021); right foot front 6.5 seconds, left foot front 8.5 seconds (11/21/2021); right foot front 8 seconds, left foot front 7 seconds (01/01/2022);  right foot front 9 seconds, left foot front 20 seconds (02/15/2022); right foot front 30 seconds, left foot front 45 seconds (03/27/2022); right foot front 16 seconds, left foot front 17 seconds (08/20/2022); right foot front 26 seconds, left foot front 43 seconds (10/17/2022);  Goal status: MET 03/27/22     PLAN: PT FREQUENCY: 2x/week   PT DURATION: 12 weeks   PLANNED INTERVENTIONS: Therapeutic exercises, Therapeutic activity, Neuromuscular re-education, Balance training, Gait training, Patient/Family education, Joint mobilization, Orthotic/Fit training, DME instructions, Dry Needling, Electrical stimulation, Cryotherapy, Moist  heat, Splintting, Taping, and Manual therapy   PLAN FOR NEXT SESSION: continue with moderate intensity functional strengthening and balance exercises as able, address back and right glute pain to allow return to functional strengthening and balance with better tolerance.     [name]. Ilsa Iha, PT, DPT 01/03/23, 5:44 PM  [facility] [address] [place], [place] [address] P: [phone] I F: [phone]

## 2023-01-08 ENCOUNTER — Encounter: Payer: Self-pay | Admitting: Physical Therapy

## 2023-01-08 ENCOUNTER — Ambulatory Visit: Payer: 59 | Admitting: Physical Therapy

## 2023-01-08 DIAGNOSIS — M6281 Muscle weakness (generalized): Secondary | ICD-10-CM

## 2023-01-08 DIAGNOSIS — R2689 Other abnormalities of gait and mobility: Secondary | ICD-10-CM | POA: Diagnosis not present

## 2023-01-08 DIAGNOSIS — Z9181 History of falling: Secondary | ICD-10-CM

## 2023-01-08 NOTE — Therapy (Signed)
OUTPATIENT PHYSICAL THERAPY TREATMENT   Patient Name: [name] MRN: [id number] DOB:21-Nov-1975, 47 y.o., female Today's Date: 01/08/2023  PCP: Eunice Blase, PA-C REFERRING PROVIDER: Eunice Blase, PA-C  END OF SESSION:   PT End of Session - 01/08/23 1438     Visit Number 57    Number of Visits 73    Date for PT Re-Evaluation 01/09/23    Authorization Type [employer] MEDICARE/Medicaid reporting period from 10/17/2022    Progress Note Due on Visit 50    PT Start Time 1435    PT Stop Time 1513    PT Time Calculation (min) 38 min    Activity Tolerance Patient tolerated treatment well    Behavior During Therapy WFL for tasks assessed/performed              Past Medical History:  Diagnosis Date   Alcohol abuse    Anxiety    Anxiety    Anxiety and depression    Asthma    Bipolar disorder ([name])    Borderline personality disorder ([name])    Depression    Diabetes ([name])    Type 2    Dyslipidemia    Esophageal varices ([name])    Foot drop    right   [name] ([name])    GERD (gastroesophageal reflux disease)    Gout    History of drug abuse ([name])    Insomnia    Muscular dystrophies ([name])    Neuropathy    Pancreatitis    Recovering alcoholic ([name])    Past Surgical History:  Procedure Laterality Date   ESOPHAGOGASTRODUODENOSCOPY N/A 04/25/2022   Procedure: ESOPHAGOGASTRODUODENOSCOPY (EGD);  Surgeon: [name], MD;  Location: [facility] ENDOSCOPY;  Service: Gastroenterology;  Laterality: N/A;   HERNIA REPAIR     IR FLUORO GUIDE CV LINE RIGHT  12/30/2020   IR REMOVAL TUN CV CATH W/O FL  01/19/2021   IR US GUIDE VASC ACCESS RIGHT  12/30/2020   Patient Active Problem List   Diagnosis Date Noted   Gait abnormality 05/24/2022   Right hand weakness 05/24/2022   Frequent falls 02/12/2021   Leukocytosis    Aspiration pneumonia of both lower lobes due to gastric secretions ([name]) 01/04/2021   Pressure injury of skin 12/24/2020   Acute hepatic  encephalopathy ([name]) 12/20/2020   Sepsis ([name]) 12/19/2020   Acute hyponatremia 12/18/2020   Alcohol use disorder, moderate, dependence ([name]) 12/18/2020   Hepatic steatosis 12/18/2020   Polysubstance abuse ([name]) 12/18/2020   Hyperglycemia due to type 2 diabetes mellitus ([name]) 12/18/2020   Hyperbilirubinemia 12/18/2020   Lactic acidosis 12/18/2020   Alcoholic liver disease ([name]) 12/18/2020   Bipolar 1 disorder, manic, moderate ([name]) 09/10/2018   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   Diabetes ([name]) 09/10/2018   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   Chronic gout of foot 09/29/2015   Muscular dystrophy ([name]) 09/29/2015   Type 2 diabetes mellitus without complication ([name]) 09/29/2015   Anxiety and depression 10/01/2012   Protein-calorie malnutrition, severe ([name]) 09/30/2012   Trichomonas infection 09/26/2012   Dehydration 09/26/2012   Hypokalemia 11/14/2011   Thrombocytopenia ([name]) 11/14/2011   Bipolar 2 disorder ([name]) 11/13/2011   Gout 11/13/2011     REFERRING DIAG: bilateral leg weakness  THERAPY DIAG:  Other abnormalities of gait and mobility  Muscle weakness (generalized)  History of falling  Rationale for Evaluation and Treatment Rehabilitation  PERTINENT HISTORY: Patient is a 47 y.o. female who presents to outpatient physical therapy with a referral for medical  diagnosis bilateral leg weakness. This patient's chief complaints consist of difficulty with weakness and balance with history of frequent falling leading to the following functional deficits: difficulty with basic mobility including ambulation, stairs, transfers; difficulty with ADLs, IADLs. Currently requires assistance to navigate stairs, assistance with ADLs and  IADLs, and ambulates with RW when she previously did not need an assistive device. Relevant past medical history and comorbidities include muscular dystrophy (FSHD - facioscapulohumeral muscular dystrophy), anxiety and depression,  bipolar 1 disorder, borderline personality disorder, diabetes, GERD, gout, pancreatitis, alcoholic liver disease, tobacco abuse,  foot drop, neuropathy, recovering alcoholic, history of drug abuse, history of hernia repair.  Patient denies hx of cancer, stroke, seizures, lung problems, heart problems, unexplained weight loss, unexplained changes in bowel or bladder problems, unexplained stumbling or dropping things, osteoporosis, and spinal surgery.   PRECAUTIONS: Fall, not drink alcohol.  SUBJECTIVE: Patient arrives with rollator. She states her pain is still there but better than before. She was tuckered out after last PT session. She states the pain has been better since last PT session. She did 3x6 sit to stands on Saturday. She denies falls. She would like to work on the leg press and sled today. Agreed her goal for HEP until next PT session is 3x5 sit <> stands 3 times a week.   PAIN: NPRS 4-4.5/10 right lower back to right glute.   OBJECTIVE   TODAY'S TREATMENT:    Therapeutic exercise: to centralize symptoms and improve ROM, strength, muscular endurance, and activity tolerance required for successful completion of functional activities.  - seated leg press, seat position 1, 1x10 at 35#, 4x10 at 65# double leg stance.  - sled push three sets of 2x51ft (sled only, 75lb)   - standing heel raises with B UE support, 3x10  Manual therapy: to reduce pain and tissue tension, improve range of motion, neuromodulation, in order to promote improved ability to complete functional activities. PRONE - STM to bilateral lumbar paraspinals and right posterior hip musculature including glutes and deep hip rotators.   Modality: (unbilled) Dry needling performed to low back and right piriformis to decrease pain and spasms along patient's low back and right glute region with patient in prone utilizing 2 dry needle(s) .30mm x with 1 stick each level  each side at lumbar multifidi at L4 and L5, 1 stick  at right piriformis and 1 stick just lateral to right SIJ. Patient educated about the risks and benefits from therapy and verbally consents to treatment and draped appropriately. Dry needling performed by [name]. Ilsa Iha PT, DPT who is certified in this technique.   Pt required multimodal cuing for proper technique and to facilitate improved neuromuscular control, strength, range of motion, and functional ability resulting in improved performance and form.   PATIENT EDUCATION:  Education details: Exercise purpose/form. Self management techniques. Education on diagnosis, prognosis, POC, anatomy and physiology of current condition. Dry needling.  Person educated: Patient Education method: Explanation, demonstration, verbal cuing, tactile cuing. Education comprehension: verbalized understanding, demonstrated understanding, and needs further education      HOME EXERCISE PROGRAM: Access Code: [email or web address] URL: [email or web address] Date: 08/20/2022 Prepared by: Norton Blizzard  Exercises - Sit to Stand Without Arm Support  - 3 x weekly - 3 sets - 5 reps - Seated Toe Raise  - 3 x weekly - 3 sets - 6 reps - 1 second hold - Supine Lower Trunk Rotation  - 3 x weekly - 1 sets - 20 reps - 1-5 seconds hold -  Prone Press Up  - 3 x weekly - 2 sets - 5 reps    ASSESSMENT:   CLINICAL IMPRESSION:  Patient arrives with improved pain since last PT session. Continued with strengthening as tolerated. Continued with dry needling and manual therapy for pain control. Patient fatigued quickly but put in good effort and was able to progress slightly with sufficient seated rests. Patient would benefit from continued management of limiting condition by skilled physical therapist to address remaining impairments and functional limitations to work towards stated goals and return to PLOF or maximal functional independence.   OBJECTIVE IMPAIRMENTS Abnormal gait, decreased activity tolerance, decreased balance,  decreased coordination, decreased endurance, decreased mobility, difficulty walking, decreased ROM, decreased strength, impaired perceived functional ability, increased muscle spasms, impaired tone, impaired UE functional use, improper body mechanics, postural dysfunction, and pain.    ACTIVITY LIMITATIONS cleaning, community activity, driving, meal prep, laundry, and shopping.    PERSONAL FACTORS Behavior pattern, Fitness, Past/current experiences, Time since onset of injury/illness/exacerbation, Transportation, and 3+ comorbidities:    muscular dystrophy (FSHD - facioscapulohumeral muscular dystrophy), anxiety and depression, bipolar 1 disorder, borderline personality disorder, diabetes, GERD, gout, pancreatitis, alcoholic liver disease, tobacco abuse,  foot drop, neuropathy, recovering alcoholic, history of drug abuse, history of hernia repair are also affecting patient's functional outcome.      REHAB POTENTIAL: Good   CLINICAL DECISION MAKING: Evolving/moderate complexity   EVALUATION COMPLEXITY: Moderate     GOALS: Goals reviewed with patient? No   SHORT TERM GOALS: Target date: 07/24/2021   Patient will be independent with initial home exercise program for self-management of symptoms. Baseline: Initial HEP to be provided at visit 2 as appropriate (07/10/21); Goal status: Met     LONG TERM GOALS: Target date: 10/02/2021; updated to 12/27/2021 for all unmet goals on 10/04/2021; updated to 03/26/2022 for all unmet goals on 01/01/2022; updated to 06/19/2022 for all unmet goals on 03/27/2022; updated to 08/14/2022 for all unmet goals on 05/22/2022, updated to 11/12/2022 for all unmet goals on 08/20/2022, updated to 01/09/2023 for all unmet goals on 10/17/2022.    Patient will be independent with a long-term home exercise program for self-management of symptoms.  Baseline: Initial HEP to be provided at visit 2 as appropriate (07/10/21); participating as tolerated, recently too much pain to participate  (09/04/2021); participating regularly (10/04/2021); participating as able (11/21/2021); not participating regularly over last couple of weeks (01/01/2022); no participating regularly since last progress note (02/15/2022); improving participation (03/27/2022); has not last two weeks due to recent fall (05/17/22); has not been participating (08/20/2022); patient is participating in HEP 2x a week (10/17/2022);  Goal status:  In-progress   2.  Patient will demonstrate improved FOTO score by 10 points from baseline to demonstrate improvement in overall condition and self-reported functional ability.  Baseline: to be measured visit 2 as appropriate (07/10/21); 47 at visit # 2 (07/17/2021); 50 at visit # 10 (09/04/2021); 53 at visit #15 (10/04/2021); 50 at visit # 20 (11/21/2021); 52 at visit # 26 (01/01/2022); 50 at visit #30 (02/15/2022); 54 at visit # 34 (03/27/22);  53 (05/17/22); 51 at visit #44 (08/20/2022); 58 at visit #50 (10/17/2022);  Goal status: In-Progress   3.  Patient will ambulate equal or greater than 1000 feet during 6 Min Walk Test using [facility] or less restrictive assistive device to improve community ambulation.  Baseline: To be tested visit 2 as appropriate (07/10/21); 310 feet with RW (07/17/2021); 556 feet with rollator (09/04/2021); 600 feet with rollator (10/04/2021); 300 feet  with rollator - had to stop test at 3 min due to intolerable right glute pain (11/21/2021); 420 feet with rollator and steppage gait, R > L. Stopped at 5 min due to feeling of instability in low back to mid thoracic spine (01/01/2022); 573 feet with rollator and steppage gait, R > L. Occasional standing breaks. Low back pain increased to 3.5/10 (02/15/2022);  381 feet with rollator and steppage gait, R > L. Stopped at 4:30 min due to pain in low back and right glute (03/27/2022); 124' in 1 minute 22 sec. Had to discontinue due to R back/hip pain (05/17/2022); 200 feet with rollator, discontinued due to low back/glute pain (08/20/2022); 500 feet with  rollator (10/17/2022);  Goal status: In-Progress   4.  Patient will complete 5 Time Sit To Stand test in equal or less than 20 seconds with no UE support from 18.5 inch plinth to demonstrate improved LE functional strength and power for basic mobility and improved fall risk.  Baseline: 24 seconds with B UE on 18.5 inch plinth.  (07/10/21); 20 seconds with B UE on thighs from 18.5 inch plinth (09/04/2021); 19 seconds with B UE on thighs from 18.5 inch plinth (10/04/2021); 22 seconds with B UE on thighs from 18.5 inch plinth (11/21/2021); 20 seconds with B UE on thighs from 18.5 inch plinth (01/01/2022); 19 seconds without use of B UE support (02/15/2022); 21 seconds with no UE support from 18.5 inch plinth (03/27/2022); 20.5 seconds no UE support and 18" surface (05/17/2022); 20 seconds with no UE support from 18.5 inch plinth (08/20/2022);  Goal status: in progress     5.  Patient be able to ascend and descend stairs at her home mod I with use of B handrails.  Baseline: able to ascend/descend 4 steps in the clinic with B UE support on handrails and supervision - reports currently needs assistance to descend steps at home. (07/10/21); able to ascend/descend 4 steps in the clinic with B UE support on handrails and supervision, leading up and down with left. able to perform with reversed LE leads (09/04/2021); able to ascend/descend 4 steps in the clinic with B UE support on handrails and supervision, step over step with heavy UE use on the way down (10/04/2021); able to ascend/descend 4 steps in the clinic with B UE support on handrails and supervision, step over step up and step to leading with R LE with heavy UE use on the way down (11/21/2021); ascend/descends 4 steps in the clinic with B UE support on handrails and supervision, step over step up and down with heavy UE use on the way down (01/01/2022); able to ascend/descend 4 steps in the clinic with B UE support on handrails and supervision, step over step up and down  with heavy UE use on the way down. leads up with right foot step to gait (02/15/2022; 03/27/22); able to ascend/descend 4 steps in the clinic with B UE support on handrails and supervision, step over step up and down with moderately heavy UE use on the way down (08/20/2022); able to ascend/descend 4 steps in the clinic with B UE support on handrails and supervision, step over step up and down with UE use on the way down (10/17/2022);  Goal status: MET   6.  Patient will demonstrate ability to perform static balance in tandem stance for equal or greater than 30 seconds on each side, to demonstrate improved balance and decreased fall risk.  Baseline: right foot front 10 seconds, left foot front  7 seconds (07/10/2021); right foot front 4 seconds, left foot front 6 seconds (09/04/2021); right foot front 9 seconds, left foot front 31 seconds (10/04/2021); right foot front 6.5 seconds, left foot front 8.5 seconds (11/21/2021); right foot front 8 seconds, left foot front 7 seconds (01/01/2022);  right foot front 9 seconds, left foot front 20 seconds (02/15/2022); right foot front 30 seconds, left foot front 45 seconds (03/27/2022); right foot front 16 seconds, left foot front 17 seconds (08/20/2022); right foot front 26 seconds, left foot front 43 seconds (10/17/2022);  Goal status: MET 03/27/22     PLAN: PT FREQUENCY: 2x/week   PT DURATION: 12 weeks   PLANNED INTERVENTIONS: Therapeutic exercises, Therapeutic activity, Neuromuscular re-education, Balance training, Gait training, Patient/Family education, Joint mobilization, Orthotic/Fit training, DME instructions, Dry Needling, Electrical stimulation, Cryotherapy, Moist heat, Splintting, Taping, and Manual therapy   PLAN FOR NEXT SESSION: continue with moderate intensity functional strengthening and balance exercises as able, address back and right glute pain to allow return to functional strengthening and balance with better tolerance.     [name]. Ilsa Iha, PT,  DPT 01/08/23, 7:48 PM  [facility] [address] [place], [place] [address] P: [phone] I F: [phone]

## 2023-01-15 ENCOUNTER — Ambulatory Visit: Payer: 59 | Admitting: Physical Therapy

## 2023-01-16 ENCOUNTER — Encounter: Payer: 59 | Admitting: Physical Therapy

## 2023-01-22 ENCOUNTER — Ambulatory Visit: Payer: 59 | Attending: Internal Medicine | Admitting: Physical Therapy

## 2023-01-22 ENCOUNTER — Encounter: Payer: Self-pay | Admitting: Physical Therapy

## 2023-01-22 ENCOUNTER — Emergency Department
Admission: EM | Admit: 2023-01-22 | Discharge: 2023-01-22 | Disposition: A | Discharge status: Home / Self Care | Payer: 59 | Attending: Emergency Medicine | Admitting: Emergency Medicine

## 2023-01-22 VITALS — BP 76/56 | HR 113

## 2023-01-22 DIAGNOSIS — F1410 Cocaine abuse, uncomplicated: Secondary | ICD-10-CM | POA: Diagnosis not present

## 2023-01-22 DIAGNOSIS — E860 Dehydration: Secondary | ICD-10-CM | POA: Insufficient documentation

## 2023-01-22 DIAGNOSIS — M6281 Muscle weakness (generalized): Secondary | ICD-10-CM | POA: Diagnosis not present

## 2023-01-22 DIAGNOSIS — R2689 Other abnormalities of gait and mobility: Secondary | ICD-10-CM | POA: Insufficient documentation

## 2023-01-22 DIAGNOSIS — I499 Cardiac arrhythmia, unspecified: Secondary | ICD-10-CM | POA: Diagnosis not present

## 2023-01-22 DIAGNOSIS — E119 Type 2 diabetes mellitus without complications: Secondary | ICD-10-CM | POA: Diagnosis not present

## 2023-01-22 DIAGNOSIS — G7100 Muscular dystrophy, unspecified: Secondary | ICD-10-CM | POA: Diagnosis not present

## 2023-01-22 DIAGNOSIS — R404 Transient alteration of awareness: Secondary | ICD-10-CM | POA: Diagnosis not present

## 2023-01-22 DIAGNOSIS — N390 Urinary tract infection, site not specified: Secondary | ICD-10-CM | POA: Diagnosis not present

## 2023-01-22 DIAGNOSIS — R000 Tachycardia, unspecified: Secondary | ICD-10-CM | POA: Insufficient documentation

## 2023-01-22 DIAGNOSIS — Z9181 History of falling: Secondary | ICD-10-CM | POA: Insufficient documentation

## 2023-01-22 DIAGNOSIS — R6889 Other general symptoms and signs: Secondary | ICD-10-CM | POA: Diagnosis not present

## 2023-01-22 DIAGNOSIS — Z743 Need for continuous supervision: Secondary | ICD-10-CM | POA: Diagnosis not present

## 2023-01-22 DIAGNOSIS — N179 Acute kidney failure, unspecified: Secondary | ICD-10-CM | POA: Insufficient documentation

## 2023-01-22 DIAGNOSIS — M79604 Pain in right leg: Secondary | ICD-10-CM | POA: Diagnosis not present

## 2023-01-22 DIAGNOSIS — K047 Periapical abscess without sinus: Secondary | ICD-10-CM | POA: Diagnosis not present

## 2023-01-22 DIAGNOSIS — R031 Nonspecific low blood-pressure reading: Secondary | ICD-10-CM | POA: Diagnosis present

## 2023-01-22 DIAGNOSIS — R55 Syncope and collapse: Secondary | ICD-10-CM | POA: Diagnosis present

## 2023-01-22 DIAGNOSIS — R42 Dizziness and giddiness: Secondary | ICD-10-CM | POA: Diagnosis not present

## 2023-01-22 LAB — CBC WITH DIFFERENTIAL/PLATELET
Abs Immature Granulocytes: 0.02 10*3/uL (ref 0.00–0.07)
Basophils Absolute: 0.1 10*3/uL (ref 0.0–0.1)
Basophils Relative: 1 %
Eosinophils Absolute: 0.1 10*3/uL (ref 0.0–0.5)
Eosinophils Relative: 1 %
HCT: 38.2 % (ref 36.0–46.0)
Hemoglobin: 12.2 g/dL (ref 12.0–15.0)
Immature Granulocytes: 0 %
Lymphocytes Relative: 31 %
Lymphs Abs: 3.2 10*3/uL (ref 0.7–4.0)
MCH: 27.9 pg (ref 26.0–34.0)
MCHC: 31.9 g/dL (ref 30.0–36.0)
MCV: 87.4 fL (ref 80.0–100.0)
Monocytes Absolute: 0.4 10*3/uL (ref 0.1–1.0)
Monocytes Relative: 4 %
Neutro Abs: 6.6 10*3/uL (ref 1.7–7.7)
Neutrophils Relative %: 63 %
Platelets: 229 10*3/uL (ref 150–400)
RBC: 4.37 MIL/uL (ref 3.87–5.11)
RDW: 13.8 % (ref 11.5–15.5)
WBC: 10.3 10*3/uL (ref 4.0–10.5)
nRBC: 0 % (ref 0.0–0.2)

## 2023-01-22 LAB — URINALYSIS, ROUTINE W REFLEX MICROSCOPIC
Bilirubin Urine: NEGATIVE
Glucose, UA: NEGATIVE mg/dL
Hgb urine dipstick: NEGATIVE
Ketones, ur: NEGATIVE mg/dL
Nitrite: NEGATIVE
Protein, ur: NEGATIVE mg/dL
Specific Gravity, Urine: 1.004 — ABNORMAL LOW (ref 1.005–1.030)
WBC, UA: >50 WBC/hpf (ref 0–5)
pH: 6 (ref 5.0–8.0)

## 2023-01-22 LAB — BASIC METABOLIC PANEL
Anion gap: 11 (ref 5–15)
Anion gap: 8 (ref 5–15)
BUN: 40 mg/dL — ABNORMAL HIGH (ref 6–20)
BUN: 40 mg/dL — ABNORMAL HIGH (ref 6–20)
CO2: 19 mmol/L — ABNORMAL LOW (ref 22–32)
CO2: 25 mmol/L (ref 22–32)
Calcium: 8.4 mg/dL — ABNORMAL LOW (ref 8.9–10.3)
Calcium: 8.7 mg/dL — ABNORMAL LOW (ref 8.9–10.3)
Chloride: 100 mmol/L (ref 98–111)
Chloride: 101 mmol/L (ref 98–111)
Creatinine, Ser: 1.64 mg/dL — ABNORMAL HIGH (ref 0.44–1.00)
Creatinine, Ser: 1.67 mg/dL — ABNORMAL HIGH (ref 0.44–1.00)
GFR, Estimated: 38 mL/min — ABNORMAL LOW (ref >60)
GFR, Estimated: 39 mL/min — ABNORMAL LOW (ref >60)
Glucose, Bld: 116 mg/dL — ABNORMAL HIGH (ref 70–99)
Glucose, Bld: 157 mg/dL — ABNORMAL HIGH (ref 70–99)
Potassium: 4.8 mmol/L (ref 3.5–5.1)
Potassium: 5.5 mmol/L — ABNORMAL HIGH (ref 3.5–5.1)
Sodium: 131 mmol/L — ABNORMAL LOW (ref 135–145)
Sodium: 133 mmol/L — ABNORMAL LOW (ref 135–145)

## 2023-01-22 LAB — HEPATIC FUNCTION PANEL
ALT: 13 U/L (ref 0–44)
AST: 24 U/L (ref 15–41)
Albumin: 3.3 g/dL — ABNORMAL LOW (ref 3.5–5.0)
Alkaline Phosphatase: 78 U/L (ref 38–126)
Bilirubin, Direct: 0.3 mg/dL — ABNORMAL HIGH (ref 0.0–0.2)
Indirect Bilirubin: 0.6 mg/dL (ref 0.3–0.9)
Total Bilirubin: 0.9 mg/dL (ref <1.2)
Total Protein: 7.3 g/dL (ref 6.5–8.1)

## 2023-01-22 LAB — PREGNANCY, URINE: Preg Test, Ur: NEGATIVE

## 2023-01-22 LAB — TROPONIN I (HIGH SENSITIVITY)
Troponin I (High Sensitivity): 4 ng/L (ref <18)
Troponin I (High Sensitivity): 5 ng/L (ref <18)
Troponin I (High Sensitivity): 4 ng/L (ref <18)

## 2023-01-22 LAB — CK: Total CK: 130 U/L (ref 38–234)

## 2023-01-22 LAB — BETA-HYDROXYBUTYRIC ACID: Beta-Hydroxybutyric Acid: 0.06 mmol/L (ref 0.05–0.27)

## 2023-01-22 MED ORDER — AMOXICILLIN-POT CLAVULANATE 875-125 MG PO TABS: 1.0000 | ORAL_TABLET | Freq: Two times a day (BID) | ORAL | 0 refills | Status: AC

## 2023-01-22 MED ORDER — SODIUM CHLORIDE 0.9 % IV BOLUS: 1000.0000 mL | Freq: Once | INTRAVENOUS | Status: DC

## 2023-01-22 MED ORDER — CEPHALEXIN 500 MG PO CAPS: 500.0000 mg | ORAL_CAPSULE | Freq: Two times a day (BID) | ORAL | 0 refills | Status: DC

## 2023-01-22 MED ORDER — LIDOCAINE 5 % EX PTCH
1.0000 | MEDICATED_PATCH | Freq: Once | CUTANEOUS | Status: DC
  Administered 2023-01-22: 1 via TRANSDERMAL
  Filled 2023-01-22: qty 1

## 2023-01-22 MED ORDER — SODIUM CHLORIDE 0.9 % IV BOLUS
1000.0000 mL | Freq: Once | INTRAVENOUS | Status: AC
  Administered 2023-01-22: 1000 mL via INTRAVENOUS

## 2023-01-22 MED ORDER — SODIUM CHLORIDE 0.9 % IV SOLN
1.0000 g | Freq: Once | INTRAVENOUS | Status: AC
  Administered 2023-01-22: 1 g via INTRAVENOUS
  Filled 2023-01-22: qty 10

## 2023-01-22 NOTE — Therapy (Signed)
OUTPATIENT PHYSICAL THERAPY TREATMENT / PROGRESS NOTE / RE-CERTIFICATION Dates of reporting from 10/17/2022 to 01/22/2023    Patient Name: [name] MRN: [id number] DOB:1975/05/07, 47 y.o., female Today's Date: 01/22/2023  PCP: Eunice Blase, PA-C REFERRING PROVIDER: Eunice Blase, PA-C  END OF SESSION:   PT End of Session - 01/22/23 1302     Visit Number 58    Number of Visits 73    Date for PT Re-Evaluation 02/05/23    Authorization Type [employer] MEDICARE/Medicaid reporting period from 10/17/2022    Progress Note Due on Visit 50    PT Start Time 1302    PT Stop Time 1350    PT Time Calculation (min) 48 min    Activity Tolerance Patient tolerated treatment well    Behavior During Therapy WFL for tasks assessed/performed               Past Medical History:  Diagnosis Date   Alcohol abuse    Anxiety    Anxiety    Anxiety and depression    Asthma    Bipolar disorder ([name])    Borderline personality disorder ([name])    Depression    Diabetes ([name])    Type 2    Dyslipidemia    Esophageal varices ([name])    Foot drop    right   [name] ([name])    GERD (gastroesophageal reflux disease)    Gout    History of drug abuse ([name])    Insomnia    Muscular dystrophies ([name])    Neuropathy    Pancreatitis    Recovering alcoholic ([name])    Past Surgical History:  Procedure Laterality Date   ESOPHAGOGASTRODUODENOSCOPY N/A 04/25/2022   Procedure: ESOPHAGOGASTRODUODENOSCOPY (EGD);  Surgeon: [name], MD;  Location: [facility] ENDOSCOPY;  Service: Gastroenterology;  Laterality: N/A;   HERNIA REPAIR     IR FLUORO GUIDE CV LINE RIGHT  12/30/2020   IR REMOVAL TUN CV CATH W/O FL  01/19/2021   IR US GUIDE VASC ACCESS RIGHT  12/30/2020   Patient Active Problem List   Diagnosis Date Noted   Gait abnormality 05/24/2022   Right hand weakness 05/24/2022   Frequent falls 02/12/2021   Leukocytosis    Aspiration pneumonia of both lower lobes due to gastric  secretions ([name]) 01/04/2021   Pressure injury of skin 12/24/2020   Acute hepatic encephalopathy ([name]) 12/20/2020   Sepsis ([name]) 12/19/2020   Acute hyponatremia 12/18/2020   Alcohol use disorder, moderate, dependence ([name]) 12/18/2020   Hepatic steatosis 12/18/2020   Polysubstance abuse ([name]) 12/18/2020   Hyperglycemia due to type 2 diabetes mellitus ([name]) 12/18/2020   Hyperbilirubinemia 12/18/2020   Lactic acidosis 12/18/2020   Alcoholic liver disease ([name]) 12/18/2020   Bipolar 1 disorder, manic, moderate ([name]) 09/10/2018   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   Diabetes ([name]) 09/10/2018   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   Chronic gout of foot 09/29/2015   Muscular dystrophy ([name]) 09/29/2015   Type 2 diabetes mellitus without complication ([name]) 09/29/2015   Anxiety and depression 10/01/2012   Protein-calorie malnutrition, severe ([name]) 09/30/2012   Trichomonas infection 09/26/2012   Dehydration 09/26/2012   Hypokalemia 11/14/2011   Thrombocytopenia ([name]) 11/14/2011   Bipolar 2 disorder ([name]) 11/13/2011   Gout 11/13/2011     REFERRING DIAG: bilateral leg weakness  THERAPY DIAG:  Other abnormalities of gait and mobility  Muscle weakness (generalized)  History of falling  Rationale for Evaluation and Treatment Rehabilitation  PERTINENT HISTORY: Patient is a  47 y.o. female who presents to outpatient physical therapy with a referral for medical diagnosis bilateral leg weakness. This patient's chief complaints consist of difficulty with weakness and balance with history of frequent falling leading to the following functional deficits: difficulty with basic mobility including ambulation, stairs, transfers; difficulty with ADLs, IADLs. Currently requires assistance to navigate stairs, assistance with ADLs and  IADLs, and ambulates with RW when she previously did not need an assistive device. Relevant past medical history and comorbidities include muscular  dystrophy (FSHD - facioscapulohumeral muscular dystrophy), anxiety and depression, bipolar 1 disorder, borderline personality disorder, diabetes, GERD, gout, pancreatitis, alcoholic liver disease, tobacco abuse,  foot drop, neuropathy, recovering alcoholic, history of drug abuse, history of hernia repair.  Patient denies hx of cancer, stroke, seizures, lung problems, heart problems, unexplained weight loss, unexplained changes in bowel or bladder problems, unexplained stumbling or dropping things, osteoporosis, and spinal surgery.   PRECAUTIONS: Fall, not drink alcohol.  SUBJECTIVE: Patient arrives with rollator. She states she felt tired after last PT session but fine. She did not do her HEP since last PT session. She states she has been getting pain down the lateral right leg to her heel from her back about 2 days a week and not all day, but now it is constant for the last 2-3 days. She cannot think of anything that made it start. She states getting horizontal makes it feel better, and sitting makes it worse and walking is "not wonderful" either.   PAIN: NPRS 7/10 right lower back to heel  OBJECTIVE    Vitals:   01/22/23 1317 01/22/23 1320  BP: (!) 85/43 (!) 80/56  Pulse: (!) 120 (!) 112  SpO2: 98% 92%  1317 automatic cuff, seated; 1320 manual cuff supine (manual HR, even pulse);   Vitals:   01/22/23 1329  BP: (!) 76/56  Pulse: (!) 113  SpO2: 92%  1329 supine, manual cuff   LOWER LIMB NEURODYNAMIC TESTS Slump Test (entire nervous system)  R = concordant pain with extension of knee in dorsiflexed position, increased pain with neck extension.   L = negative   FUNCTIONAL/BALANCE TESTS: 6 Minute Walk Test: 159 feet with rollator and steppage gait, R > L. Stopped after less than 2 minutes due to lightheadedness (leg felt a bit better while walking).     TODAY'S TREATMENT:  Therapeutic exercise: to centralize symptoms and improve ROM, strength, muscular endurance, and activity tolerance  required for successful completion of functional activities.  - seated slump test (see above)  - seated AAROM right sciatic nerve glide right LE, 1x10 (increased pain in right leg).  - 6 Minute Walk Test to assess progress (improved leg pain, stopped after less than 2 min due to lightheadedness - vitals measurement in seated, and twice in hooklying after becoming light headed, then pre-syncopal.  - attempted supine to sit twice with min A to max A but patient started to pass out both times and required maxA to return to hooklying  Session interrupted/ended by need to call EMS due to low BP/high HR and inability to return to upright position.   PATIENT EDUCATION:  Education details: Exercise purpose/form. Self management techniques. Education on diagnosis, prognosis, POC, anatomy and physiology of current condition. Dry needling.  Person educated: Patient Education method: Explanation, demonstration, verbal cuing, tactile cuing. Education comprehension: verbalized understanding, demonstrated understanding, and needs further education      HOME EXERCISE PROGRAM: Access Code: [email or web address] URL: [email or web address] Date: 08/20/2022 Prepared by: Norton Blizzard  Exercises -  Sit to Stand Without Arm Support  - 3 x weekly - 3 sets - 5 reps - Seated Toe Raise  - 3 x weekly - 3 sets - 6 reps - 1 second hold - Supine Lower Trunk Rotation  - 3 x weekly - 1 sets - 20 reps - 1-5 seconds hold - Prone Press Up  - 3 x weekly - 2 sets - 5 reps    ASSESSMENT:   CLINICAL IMPRESSION:  Patient has attended 3 physical therapy sessions since starting current episode of care on 07/10/2021. While attempting to complete a progress note today, patient became pre-syncopal and found to have very low blood pressure and elevated heart rate which ended the session. She had new/worsened pain down the right LE that was consistent with sciatica with positive slump test. It was irritated more by sitting and less  by standing, but she was unable to tolerate ambulation due to pre-syncope. She first reported feeling lightheaded during , which resulted in her returning to sitting which mildly improved her lightheadedness. Her vitals were checked in sitting and found to be BP 85/43 mmHG and HR 120 bpm.  When she became intolerant to sitting due to right sciatic pain, she ambulated a few feet with her rollator to a nearby plinth were she had resolution of her back pain with the hooklying position. Vitals measurements were repeated using manual cuff and palpated pulse at least 2 min apart in hooklying where she was not lightheaded. Vitals did not improve, and pulse was weak but steady. Vitals did not improve and patient was unable to return to sitting without becoming pre-syncopal (possibly lost consciousness for a few seconds first attempt) and required PT to provide max A to return to hooklying. EMS was called and they took patient to the hospital for further testing and care. Goals not updated today due to lack of ability to perform test 2nd to acute medical condition. Patient has a chronic degenerative disease and would benefit from continued PT when she is medically ready. Plan to re-assess her goals when she is appropriate to return to PT following her hospital visit.   OBJECTIVE IMPAIRMENTS Abnormal gait, decreased activity tolerance, decreased balance, decreased coordination, decreased endurance, decreased mobility, difficulty walking, decreased ROM, decreased strength, impaired perceived functional ability, increased muscle spasms, impaired tone, impaired UE functional use, improper body mechanics, postural dysfunction, and pain.    ACTIVITY LIMITATIONS cleaning, community activity, driving, meal prep, laundry, and shopping.    PERSONAL FACTORS Behavior pattern, Fitness, Past/current experiences, Time since onset of injury/illness/exacerbation, Transportation, and 3+ comorbidities:    muscular dystrophy (FSHD -  facioscapulohumeral muscular dystrophy), anxiety and depression, bipolar 1 disorder, borderline personality disorder, diabetes, GERD, gout, pancreatitis, alcoholic liver disease, tobacco abuse,  foot drop, neuropathy, recovering alcoholic, history of drug abuse, history of hernia repair are also affecting patient's functional outcome.      REHAB POTENTIAL: Good   CLINICAL DECISION MAKING: Evolving/moderate complexity   EVALUATION COMPLEXITY: Moderate     GOALS: Goals reviewed with patient? No   SHORT TERM GOALS: Target date: 07/24/2021   Patient will be independent with initial home exercise program for self-management of symptoms. Baseline: Initial HEP to be provided at visit 2 as appropriate (07/10/21); Goal status: Met     LONG TERM GOALS: Target date: 10/02/2021; updated to 12/27/2021 for all unmet goals on 10/04/2021; updated to 03/26/2022 for all unmet goals on 01/01/2022; updated to 06/19/2022 for all unmet goals on 03/27/2022; updated to 08/14/2022 for all  unmet goals on 05/22/2022, updated to 11/12/2022 for all unmet goals on 08/20/2022, updated to 01/09/2023 for all unmet goals on 10/17/2022.    Patient will be independent with a long-term home exercise program for self-management of symptoms.  Baseline: Initial HEP to be provided at visit 2 as appropriate (07/10/21); participating as tolerated, recently too much pain to participate (09/04/2021); participating regularly (10/04/2021); participating as able (11/21/2021); not participating regularly over last couple of weeks (01/01/2022); no participating regularly since last progress note (02/15/2022); improving participation (03/27/2022); has not last two weeks due to recent fall (05/17/22); has not been participating (08/20/2022); patient is participating in HEP 2x a week (10/17/2022); did not participate since last PT visit (01/22/2023);  Goal status:  In-progress   2.  Patient will demonstrate improved FOTO score by 10 points from baseline to demonstrate  improvement in overall condition and self-reported functional ability.  Baseline: to be measured visit 2 as appropriate (07/10/21); 47 at visit # 2 (07/17/2021); 50 at visit # 10 (09/04/2021); 53 at visit #15 (10/04/2021); 50 at visit # 20 (11/21/2021); 52 at visit # 26 (01/01/2022); 50 at visit #30 (02/15/2022); 54 at visit # 34 (03/27/22);  53 (05/17/22); 51 at visit #44 (08/20/2022); 58 at visit #50 (10/17/2022);  Goal status: In-Progress   3.  Patient will ambulate equal or greater than 1000 feet during 6 Min Walk Test using [facility] or less restrictive assistive device to improve community ambulation.  Baseline: To be tested visit 2 as appropriate (07/10/21); 310 feet with RW (07/17/2021); 556 feet with rollator (09/04/2021); 600 feet with rollator (10/04/2021); 300 feet with rollator - had to stop test at 3 min due to intolerable right glute pain (11/21/2021); 420 feet with rollator and steppage gait, R > L. Stopped at 5 min due to feeling of instability in low back to mid thoracic spine (01/01/2022); 573 feet with rollator and steppage gait, R > L. Occasional standing breaks. Low back pain increased to 3.5/10 (02/15/2022);  381 feet with rollator and steppage gait, R > L. Stopped at 4:30 min due to pain in low back and right glute (03/27/2022); 124' in 1 minute 22 sec. Had to discontinue due to R back/hip pain (05/17/2022); 200 feet with rollator, discontinued due to low back/glute pain (08/20/2022); 500 feet with rollator (10/17/2022);  Goal status: In-Progress   4.  Patient will complete 5 Time Sit To Stand test in equal or less than 20 seconds with no UE support from 18.5 inch plinth to demonstrate improved LE functional strength and power for basic mobility and improved fall risk.  Baseline: 24 seconds with B UE on 18.5 inch plinth.  (07/10/21); 20 seconds with B UE on thighs from 18.5 inch plinth (09/04/2021); 19 seconds with B UE on thighs from 18.5 inch plinth (10/04/2021); 22 seconds with B UE on thighs from 18.5 inch  plinth (11/21/2021); 20 seconds with B UE on thighs from 18.5 inch plinth (01/01/2022); 19 seconds without use of B UE support (02/15/2022); 21 seconds with no UE support from 18.5 inch plinth (03/27/2022); 20.5 seconds no UE support and 18" surface (05/17/2022); 20 seconds with no UE support from 18.5 inch plinth (08/20/2022);  Goal status: in progress     5.  Patient be able to ascend and descend stairs at her home mod I with use of B handrails.  Baseline: able to ascend/descend 4 steps in the clinic with B UE support on handrails and supervision - reports currently needs assistance to descend steps at  home. (07/10/21); able to ascend/descend 4 steps in the clinic with B UE support on handrails and supervision, leading up and down with left. able to perform with reversed LE leads (09/04/2021); able to ascend/descend 4 steps in the clinic with B UE support on handrails and supervision, step over step with heavy UE use on the way down (10/04/2021); able to ascend/descend 4 steps in the clinic with B UE support on handrails and supervision, step over step up and step to leading with R LE with heavy UE use on the way down (11/21/2021); ascend/descends 4 steps in the clinic with B UE support on handrails and supervision, step over step up and down with heavy UE use on the way down (01/01/2022); able to ascend/descend 4 steps in the clinic with B UE support on handrails and supervision, step over step up and down with heavy UE use on the way down. leads up with right foot step to gait (02/15/2022; 03/27/22); able to ascend/descend 4 steps in the clinic with B UE support on handrails and supervision, step over step up and down with moderately heavy UE use on the way down (08/20/2022); able to ascend/descend 4 steps in the clinic with B UE support on handrails and supervision, step over step up and down with UE use on the way down (10/17/2022);  Goal status: MET   6.  Patient will demonstrate ability to perform static balance in  tandem stance for equal or greater than 30 seconds on each side, to demonstrate improved balance and decreased fall risk.  Baseline: right foot front 10 seconds, left foot front 7 seconds (07/10/2021); right foot front 4 seconds, left foot front 6 seconds (09/04/2021); right foot front 9 seconds, left foot front 31 seconds (10/04/2021); right foot front 6.5 seconds, left foot front 8.5 seconds (11/21/2021); right foot front 8 seconds, left foot front 7 seconds (01/01/2022);  right foot front 9 seconds, left foot front 20 seconds (02/15/2022); right foot front 30 seconds, left foot front 45 seconds (03/27/2022); right foot front 16 seconds, left foot front 17 seconds (08/20/2022); right foot front 26 seconds, left foot front 43 seconds (10/17/2022);  Goal status: MET 03/27/22     PLAN: PT FREQUENCY: 2x/week   PT DURATION: 12 weeks   PLANNED INTERVENTIONS: Therapeutic exercises, Therapeutic activity, Neuromuscular re-education, Balance training, Gait training, Patient/Family education, Joint mobilization, Orthotic/Fit training, DME instructions, Dry Needling, Electrical stimulation, Cryotherapy, Moist heat, Splintting, Taping, and Manual therapy   PLAN FOR NEXT SESSION: continue with moderate intensity functional strengthening and balance exercises as able, address back and right glute pain to allow return to functional strengthening and balance with better tolerance.  Re-measure function when patient is medically ready.    [name]. Ilsa Iha, PT, DPT 01/22/23, 7:57 PM  [facility] [address] [place], [place] [address] P: [phone] I F: [phone]

## 2023-01-22 NOTE — Discharge Instructions (Addendum)
We discussed admission versus going home but she reported feeling back to her baseline self after getting fluids.  We recommend avoiding any type of drugs to prevent this from happening again and causing dehydration.  We are treating you for a potential UTI.  We recommend you have your blood work including your potassium and your creatinine rechecked in 2 days when you get follow-up with your primary care doctor you can return to the ER if you develop return of symptoms or any other concerns

## 2023-01-22 NOTE — ED Notes (Signed)
Pt ambulated to bathroom with assistance. Pts gait is baseline unsteady from Hx of muscular dystrophy. MD notified

## 2023-01-22 NOTE — ED Triage Notes (Signed)
Pt from physical therapy. Pt had a near syncopal episode. At PT they reported BP pt laying as 84/50, 80/50. EMS reports that pt BP was 84/56 , pt has received 100 mL fluids. EMS reports doing "one bump of cocaine" last night. Pt states that she started feeling dizzy last night.

## 2023-01-22 NOTE — ED Provider Notes (Addendum)
[facility] Provider Note    Event Date/Time   First MD Initiated Contact with Patient 01/22/23 1456     (approximate)   History   Near Syncope   HPI  [name] is a 47 y.o. female with history of muscular dystrophy, diabetes who comes in with concern for near syncopal episode.  Patient reports that she has been sober from alcohol for the past 2 years however she did do 1 bump of cocaine last night.  She reports a little bit of dizziness last night but more symptoms noted noted this morning.  She reports that she gets physical therapy due to her muscular dystrophy and using a rollator at baseline and they noted that she was hypotensive when she felt lightheaded.  She denies actually falling down and hitting her head.  Denies any chest pain, shortness of breath, abdominal pain.  She does report having some intermittent episodes of diarrhea.  She also reports some sciatica issues that have been going on on her right leg for a while now.  She denies any differences with those.  He denies any numbness, retinal or bladder incontinence.  She reports there is just pain shooting down the back of her right leg.  Physical Exam   Triage Vital Signs: ED Triage Vitals  Encounter Vitals Group     BP 01/22/23 1426 (!) 71/53     Systolic BP Percentile --      Diastolic BP Percentile --      Pulse Rate 01/22/23 1426 (!) 113     Resp 01/22/23 1426 16     Temp 01/22/23 1429 97.9 F (36.6 C)     Temp Source 01/22/23 1429 Oral     SpO2 01/22/23 1426 94 %     Weight 01/22/23 1427 220 lb (99.8 kg)     Height 01/22/23 1427 5\' 7"  (1.702 m)     Head Circumference --      Peak Flow --      Pain Score 01/22/23 1426 4     Pain Loc --      Pain Education --      Exclude from Growth Chart --     Most recent vital signs: Vitals:   01/22/23 1430 01/22/23 1445  BP: (!) 75/62 (!) 79/58  Pulse: (!) 107 (!) 107  Resp:  20  Temp:    SpO2: 94% 96%     General: Awake, no  distress.  CV:  Good peripheral perfusion.  Resp:  Normal effort.  Abd:  No distention.  Other:  Good distal pulses in her legs.  Abdomen is soft and nontender.  Moving all extremities well.   ED Results / Procedures / Treatments   Labs (all labs ordered are listed, but only abnormal results are displayed) Labs Reviewed  BLOOD GAS, VENOUS - Abnormal; Notable for the following components:      Result Value   pO2, Ven <31 (*)    All other components within normal limits  BASIC METABOLIC PANEL - Abnormal; Notable for the following components:   Sodium 131 (*)    Potassium 5.5 (*)    CO2 19 (*)    Glucose, Bld 157 (*)    BUN 40 (*)    Creatinine, Ser 1.64 (*)    Calcium 8.4 (*)    GFR, Estimated 39 (*)    All other components within normal limits  HEPATIC FUNCTION PANEL - Abnormal; Notable for the following components:   Albumin  3.3 (*)    Bilirubin, Direct 0.3 (*)    All other components within normal limits  CBC WITH DIFFERENTIAL/PLATELET  BETA-HYDROXYBUTYRIC ACID  URINALYSIS, ROUTINE W REFLEX MICROSCOPIC  PREGNANCY, URINE  CBG MONITORING, ED  TROPONIN I (HIGH SENSITIVITY)     EKG  My interpretation of EKG:  Sinus tachycardia rate of 107 without any ST elevation or T wave inversions except for aVL, normal intervals    PROCEDURES:  Critical Care performed: Yes, see critical care procedure note(s)  .1-3 Lead EKG Interpretation  Performed by: Concha Se, MD Authorized by: Concha Se, MD     Interpretation: abnormal     ECG rate:  110   ECG rate assessment: tachycardic     Rhythm: sinus tachycardia     Ectopy: none     Conduction: normal   .Critical Care  Performed by: Concha Se, MD Authorized by: Concha Se, MD   Critical care provider statement:    Critical care time (minutes):  30   Critical care was necessary to treat or prevent imminent or life-threatening deterioration of the following conditions: dehydration, hyperkalemia.   Critical  care was time spent personally by me on the following activities:  Development of treatment plan with patient or surrogate, discussions with consultants, evaluation of patient's response to treatment, examination of patient, ordering and review of laboratory studies, ordering and review of radiographic studies, ordering and performing treatments and interventions, pulse oximetry, re-evaluation of patient's condition and review of old charts    MEDICATIONS ORDERED IN ED: Medications  sodium chloride 0.9 % bolus 1,000 mL (1,000 mLs Intravenous New Bag/Given 01/22/23 1452)     IMPRESSION / MDM / ASSESSMENT AND PLAN / ED COURSE  I reviewed the triage vital signs and the nursing notes.   Patient's presentation is most consistent with acute presentation with potential threat to life or bodily function.   Patient comes in significantly hypotensive with near syncopal symptoms most likely secondary to low blood pressures.  Blood work ordered to evaluate for dehydration, rhabdo, AKI, electrolyte abnormalities, DKA.  She denies any infectious sources abdomen is soft and nontender no shortness of breath to suggest PE.  CBC is reassuring with normal white count.  VBG is reassuring her troponin was negative.  Her labs do show new AKI with 1.64 and a potassium of 5.5.  Glucoses are reassuring  Preg test was negative urine with concerns for potential UTI will send for urine culture.  VBG reassuring troponins x 2 are negative.  CBC reassuring BMP initially showed some dehydration with elevation of potassium but it was hemolysis so I rechecked it and her potassium was normal at 4.8.  Reevaluated patient and offered patient admission for dehydration but patient has been up and ambulatory states that she feels at her baseline self as she got follow-up in 2 days with a she can have her kidney function and her potassium rechecked then.  Her urine looks concerning for potential UTI but she denies any significant  symptoms but we discussed potentially just treating her given her low blood pressures just to ensure.  Patient given a dose of ceftriaxone.  Patient white count is normal, no fever and does not appear septic.  At this time patient would like to be discharged home and has close PCP follow-up.  Patient does report some swelling on her left lower teeth.  I did take a look I do not see any obvious abscess but I suspect that she has  some pulpitis or potentially developing abscess but nothing is amenable to I&D.  We discussed given she is got no symptoms of UTI doing Augmentin to cover for the teeth and if her culture does end up showing something they can always switch the antibiotic over but at this time she is asymptomatic for UTI and she would like to hold off on additional antibiotics and prefer to wait for culture.  The patient is on the cardiac monitor to evaluate for evidence of arrhythmia and/or significant heart rate changes.      FINAL CLINICAL IMPRESSION(S) / ED DIAGNOSES   Final diagnoses:  AKI (acute kidney injury) ([name])  Dehydration  Cocaine abuse ([name])  Dental infection     Rx / DC Orders   ED Discharge Orders          Ordered    cephALEXin (KEFLEX) 500 MG capsule  2 times daily,   Status:  Discontinued        01/22/23 1913    amoxicillin-clavulanate (AUGMENTIN) 875-125 MG tablet  2 times daily        01/22/23 2017             Note:  This document was prepared using Dragon voice recognition software and may include unintentional dictation errors.   Concha Se, MD 01/22/23 1914    Concha Se, MD 01/22/23 2017

## 2023-01-23 LAB — BLOOD GAS, VENOUS
Acid-Base Excess: 1.2 mmol/L (ref 0.0–2.0)
O2 Saturation: 27.7 mmol/L (ref 0.0–2.0)
Patient temperature: 37
pCO2, Ven: 50 mmHg (ref 44–60)
pH, Ven: 7.35 (ref 7.25–7.43)
pO2, Ven: <31 mmol/L (ref 32–45)

## 2023-01-23 LAB — CBG MONITORING, ED: Glucose-Capillary: 103 mg/dL — ABNORMAL HIGH (ref 70–99)

## 2023-01-24 DIAGNOSIS — J449 Chronic obstructive pulmonary disease, unspecified: Secondary | ICD-10-CM | POA: Diagnosis not present

## 2023-01-24 DIAGNOSIS — E1142 Type 2 diabetes mellitus with diabetic polyneuropathy: Secondary | ICD-10-CM | POA: Diagnosis not present

## 2023-01-24 LAB — URINE CULTURE: Culture: >=100000 — AB

## 2023-01-29 ENCOUNTER — Ambulatory Visit: Payer: 59 | Admitting: Physical Therapy

## 2023-02-05 ENCOUNTER — Encounter: Payer: Self-pay | Admitting: Physical Therapy

## 2023-02-05 ENCOUNTER — Ambulatory Visit: Payer: 59 | Admitting: Physical Therapy

## 2023-02-05 DIAGNOSIS — M6281 Muscle weakness (generalized): Secondary | ICD-10-CM

## 2023-02-05 DIAGNOSIS — R2689 Other abnormalities of gait and mobility: Secondary | ICD-10-CM

## 2023-02-05 DIAGNOSIS — Z9181 History of falling: Secondary | ICD-10-CM

## 2023-02-05 NOTE — Therapy (Signed)
OUTPATIENT PHYSICAL THERAPY TREATMENT    Patient Name: [name] MRN: [id number] DOB:04-30-1975, 47 y.o., female Today's Date: 02/05/2023  PCP: Eunice Blase, PA-C REFERRING PROVIDER: Eunice Blase, PA-C  END OF SESSION:   PT End of Session - 02/05/23 1352     Visit Number 59    Number of Visits 73    Date for PT Re-Evaluation 02/05/23    Authorization Type [employer] MEDICARE/Medicaid reporting period from 10/17/2022    Progress Note Due on Visit 50    PT Start Time 1350    PT Stop Time 1428    PT Time Calculation (min) 38 min    Equipment Utilized During Treatment Gait belt    Activity Tolerance Patient tolerated treatment well    Behavior During Therapy WFL for tasks assessed/performed                Past Medical History:  Diagnosis Date   Alcohol abuse    Anxiety    Anxiety    Anxiety and depression    Asthma    Bipolar disorder ([name])    Borderline personality disorder ([name])    Depression    Diabetes ([name])    Type 2    Dyslipidemia    Esophageal varices ([name])    Foot drop    right   [name] ([name])    GERD (gastroesophageal reflux disease)    Gout    History of drug abuse ([name])    Insomnia    Muscular dystrophies ([name])    Neuropathy    Pancreatitis    Recovering alcoholic ([name])    Past Surgical History:  Procedure Laterality Date   ESOPHAGOGASTRODUODENOSCOPY N/A 04/25/2022   Procedure: ESOPHAGOGASTRODUODENOSCOPY (EGD);  Surgeon: [name], MD;  Location: [facility] ENDOSCOPY;  Service: Gastroenterology;  Laterality: N/A;   HERNIA REPAIR     IR FLUORO GUIDE CV LINE RIGHT  12/30/2020   IR REMOVAL TUN CV CATH W/O FL  01/19/2021   IR US GUIDE VASC ACCESS RIGHT  12/30/2020   Patient Active Problem List   Diagnosis Date Noted   Gait abnormality 05/24/2022   Right hand weakness 05/24/2022   Frequent falls 02/12/2021   Leukocytosis    Aspiration pneumonia of both lower lobes due to gastric secretions ([name]) 01/04/2021    Pressure injury of skin 12/24/2020   Acute hepatic encephalopathy ([name]) 12/20/2020   Sepsis ([name]) 12/19/2020   Acute hyponatremia 12/18/2020   Alcohol use disorder, moderate, dependence ([name]) 12/18/2020   Hepatic steatosis 12/18/2020   Polysubstance abuse ([name]) 12/18/2020   Hyperglycemia due to type 2 diabetes mellitus ([name]) 12/18/2020   Hyperbilirubinemia 12/18/2020   Lactic acidosis 12/18/2020   Alcoholic liver disease ([name]) 12/18/2020   Bipolar 1 disorder, manic, moderate ([name]) 09/10/2018   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   Diabetes ([name]) 09/10/2018   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   Chronic gout of foot 09/29/2015   Muscular dystrophy ([name]) 09/29/2015   Type 2 diabetes mellitus without complication ([name]) 09/29/2015   Anxiety and depression 10/01/2012   Protein-calorie malnutrition, severe ([name]) 09/30/2012   Trichomonas infection 09/26/2012   Dehydration 09/26/2012   Hypokalemia 11/14/2011   Thrombocytopenia ([name]) 11/14/2011   Bipolar 2 disorder ([name]) 11/13/2011   Gout 11/13/2011     REFERRING DIAG: bilateral leg weakness  THERAPY DIAG:  Other abnormalities of gait and mobility  Muscle weakness (generalized)  History of falling  Rationale for Evaluation and Treatment Rehabilitation  PERTINENT HISTORY: Patient is a 47 y.o.  female who presents to outpatient physical therapy with a referral for medical diagnosis bilateral leg weakness. This patient's chief complaints consist of difficulty with weakness and balance with history of frequent falling leading to the following functional deficits: difficulty with basic mobility including ambulation, stairs, transfers; difficulty with ADLs, IADLs. Currently requires assistance to navigate stairs, assistance with ADLs and  IADLs, and ambulates with RW when she previously did not need an assistive device. Relevant past medical history and comorbidities include muscular dystrophy (FSHD -  facioscapulohumeral muscular dystrophy), anxiety and depression, bipolar 1 disorder, borderline personality disorder, diabetes, GERD, gout, pancreatitis, alcoholic liver disease, tobacco abuse,  foot drop, neuropathy, recovering alcoholic, history of drug abuse, history of hernia repair.  Patient denies hx of cancer, stroke, seizures, lung problems, heart problems, unexplained weight loss, unexplained changes in bowel or bladder problems, unexplained stumbling or dropping things, osteoporosis, and spinal surgery.   PRECAUTIONS: Fall, not drink alcohol.  SUBJECTIVE: Patient arrives with rollator. She states she has been limiting herself to 2 glasses of iced tea since the provider at the ED told her to to drink water instead of tea since tea is a diuretic. She was found to be very dehydrated when transported to ED by EMS during last PT session,  but was able to go home after she was given two bags of IV fluids. She states she is continuing to have some cramping in the right posterior thigh and has mild pain in her right glute and down leg to heel. She would like to do the leg press today.   PAIN: NPRS 3/10 right glute to heel  OBJECTIVE    TODAY'S TREATMENT:  Therapeutic exercise: to centralize symptoms and improve ROM, strength, muscular endurance, and activity tolerance required for successful completion of functional activities.  - seated leg press, seat position 1,  1x10 at 15#, 1x10 at 35#, 4x10 at 65# double leg stance.  - seated R sciatic nerve glide (slider technique), AAROM to assist with ankle DF. 1x15 with education on how to perform at home.   Therapeutic activities: for functional strengthening and improved functional activity tolerance. - sled push two sets of 2x64ft (sled only, 75lb), seated rest between sets, discontinued after 2nd set because of increasing right leg discomfort.   Neuromuscular Re-education: to improve, balance, postural strength, muscle activation patterns, and  stabilization strength required for functional activities: - standing reaches to tap BlazePods while standing on airex pad, reaching for 6 pods attached to window, attempting not to hold onto window for support. CGA. 2x60 seconds (very fatiguing), seated rest between sets.   Pt required multimodal cuing for proper technique and to facilitate improved neuromuscular control, strength, range of motion, and functional ability resulting in improved performance and form.  PATIENT EDUCATION:  Education details: Exercise purpose/form. Self management techniques. Education on diagnosis, prognosis, POC, anatomy and physiology of current condition. Dry needling.  Person educated: Patient Education method: Explanation, demonstration, verbal cuing, tactile cuing. Education comprehension: verbalized understanding, demonstrated understanding, and needs further education      HOME EXERCISE PROGRAM: Access Code: [email or web address] URL: [email or web address] Date: 08/20/2022 Prepared by: Norton Blizzard  Exercises - Sit to Stand Without Arm Support  - 3 x weekly - 3 sets - 5 reps - Seated Toe Raise  - 3 x weekly - 3 sets - 6 reps - 1 second hold - Supine Lower Trunk Rotation  - 3 x weekly - 1 sets - 20 reps - 1-5 seconds hold - Prone Press Up  -  3 x weekly - 2 sets - 5 reps    ASSESSMENT:   CLINICAL IMPRESSION:  Patient arrives feeling much better than at last visit. She has mild right sided glute and leg pain. She was able to return to LE/functional strengthening and balance exercises today. She fatigued quickly and required frequent rest breaks. She was limited in how many sled pushes she could do related to increasing discomfort at right LE. She demonstrated appropriate challenge with interventions and would benefit from continued participation with progressions as tolerated. Patient would benefit from continued management of limiting condition by skilled physical therapist to address remaining  impairments and functional limitations to work towards stated goals and return to PLOF or maximal functional independence.   OBJECTIVE IMPAIRMENTS Abnormal gait, decreased activity tolerance, decreased balance, decreased coordination, decreased endurance, decreased mobility, difficulty walking, decreased ROM, decreased strength, impaired perceived functional ability, increased muscle spasms, impaired tone, impaired UE functional use, improper body mechanics, postural dysfunction, and pain.    ACTIVITY LIMITATIONS cleaning, community activity, driving, meal prep, laundry, and shopping.    PERSONAL FACTORS Behavior pattern, Fitness, Past/current experiences, Time since onset of injury/illness/exacerbation, Transportation, and 3+ comorbidities:    muscular dystrophy (FSHD - facioscapulohumeral muscular dystrophy), anxiety and depression, bipolar 1 disorder, borderline personality disorder, diabetes, GERD, gout, pancreatitis, alcoholic liver disease, tobacco abuse,  foot drop, neuropathy, recovering alcoholic, history of drug abuse, history of hernia repair are also affecting patient's functional outcome.      REHAB POTENTIAL: Good   CLINICAL DECISION MAKING: Evolving/moderate complexity   EVALUATION COMPLEXITY: Moderate     GOALS: Goals reviewed with patient? No   SHORT TERM GOALS: Target date: 07/24/2021   Patient will be independent with initial home exercise program for self-management of symptoms. Baseline: Initial HEP to be provided at visit 2 as appropriate (07/10/21); Goal status: Met     LONG TERM GOALS: Target date: 10/02/2021; updated to 12/27/2021 for all unmet goals on 10/04/2021; updated to 03/26/2022 for all unmet goals on 01/01/2022; updated to 06/19/2022 for all unmet goals on 03/27/2022; updated to 08/14/2022 for all unmet goals on 05/22/2022, updated to 11/12/2022 for all unmet goals on 08/20/2022, updated to 01/09/2023 for all unmet goals on 10/17/2022.    Patient will be independent with  a long-term home exercise program for self-management of symptoms.  Baseline: Initial HEP to be provided at visit 2 as appropriate (07/10/21); participating as tolerated, recently too much pain to participate (09/04/2021); participating regularly (10/04/2021); participating as able (11/21/2021); not participating regularly over last couple of weeks (01/01/2022); no participating regularly since last progress note (02/15/2022); improving participation (03/27/2022); has not last two weeks due to recent fall (05/17/22); has not been participating (08/20/2022); patient is participating in HEP 2x a week (10/17/2022); did not participate since last PT visit (01/22/2023);  Goal status:  In-progress   2.  Patient will demonstrate improved FOTO score by 10 points from baseline to demonstrate improvement in overall condition and self-reported functional ability.  Baseline: to be measured visit 2 as appropriate (07/10/21); 47 at visit # 2 (07/17/2021); 50 at visit # 10 (09/04/2021); 53 at visit #15 (10/04/2021); 50 at visit # 20 (11/21/2021); 52 at visit # 26 (01/01/2022); 50 at visit #30 (02/15/2022); 54 at visit # 34 (03/27/22);  53 (05/17/22); 51 at visit #44 (08/20/2022); 58 at visit #50 (10/17/2022);  Goal status: In-Progress   3.  Patient will ambulate equal or greater than 1000 feet during 6 Min Walk Test using SPC or less restrictive  assistive device to improve community ambulation.  Baseline: To be tested visit 2 as appropriate (07/10/21); 310 feet with RW (07/17/2021); 556 feet with rollator (09/04/2021); 600 feet with rollator (10/04/2021); 300 feet with rollator - had to stop test at 3 min due to intolerable right glute pain (11/21/2021); 420 feet with rollator and steppage gait, R > L. Stopped at 5 min due to feeling of instability in low back to mid thoracic spine (01/01/2022); 573 feet with rollator and steppage gait, R > L. Occasional standing breaks. Low back pain increased to 3.5/10 (02/15/2022);  381 feet with rollator and  steppage gait, R > L. Stopped at 4:30 min due to pain in low back and right glute (03/27/2022); 124' in 1 minute 22 sec. Had to discontinue due to R back/hip pain (05/17/2022); 200 feet with rollator, discontinued due to low back/glute pain (08/20/2022); 500 feet with rollator (10/17/2022);  Goal status: In-Progress   4.  Patient will complete 5 Time Sit To Stand test in equal or less than 20 seconds with no UE support from 18.5 inch plinth to demonstrate improved LE functional strength and power for basic mobility and improved fall risk.  Baseline: 24 seconds with B UE on 18.5 inch plinth.  (07/10/21); 20 seconds with B UE on thighs from 18.5 inch plinth (09/04/2021); 19 seconds with B UE on thighs from 18.5 inch plinth (10/04/2021); 22 seconds with B UE on thighs from 18.5 inch plinth (11/21/2021); 20 seconds with B UE on thighs from 18.5 inch plinth (01/01/2022); 19 seconds without use of B UE support (02/15/2022); 21 seconds with no UE support from 18.5 inch plinth (03/27/2022); 20.5 seconds no UE support and 18" surface (05/17/2022); 20 seconds with no UE support from 18.5 inch plinth (08/20/2022);  Goal status: in progress     5.  Patient be able to ascend and descend stairs at her home mod I with use of B handrails.  Baseline: able to ascend/descend 4 steps in the clinic with B UE support on handrails and supervision - reports currently needs assistance to descend steps at home. (07/10/21); able to ascend/descend 4 steps in the clinic with B UE support on handrails and supervision, leading up and down with left. able to perform with reversed LE leads (09/04/2021); able to ascend/descend 4 steps in the clinic with B UE support on handrails and supervision, step over step with heavy UE use on the way down (10/04/2021); able to ascend/descend 4 steps in the clinic with B UE support on handrails and supervision, step over step up and step to leading with R LE with heavy UE use on the way down (11/21/2021); ascend/descends  4 steps in the clinic with B UE support on handrails and supervision, step over step up and down with heavy UE use on the way down (01/01/2022); able to ascend/descend 4 steps in the clinic with B UE support on handrails and supervision, step over step up and down with heavy UE use on the way down. leads up with right foot step to gait (02/15/2022; 03/27/22); able to ascend/descend 4 steps in the clinic with B UE support on handrails and supervision, step over step up and down with moderately heavy UE use on the way down (08/20/2022); able to ascend/descend 4 steps in the clinic with B UE support on handrails and supervision, step over step up and down with UE use on the way down (10/17/2022);  Goal status: MET   6.  Patient will demonstrate ability to perform  static balance in tandem stance for equal or greater than 30 seconds on each side, to demonstrate improved balance and decreased fall risk.  Baseline: right foot front 10 seconds, left foot front 7 seconds (07/10/2021); right foot front 4 seconds, left foot front 6 seconds (09/04/2021); right foot front 9 seconds, left foot front 31 seconds (10/04/2021); right foot front 6.5 seconds, left foot front 8.5 seconds (11/21/2021); right foot front 8 seconds, left foot front 7 seconds (01/01/2022);  right foot front 9 seconds, left foot front 20 seconds (02/15/2022); right foot front 30 seconds, left foot front 45 seconds (03/27/2022); right foot front 16 seconds, left foot front 17 seconds (08/20/2022); right foot front 26 seconds, left foot front 43 seconds (10/17/2022);  Goal status: MET 03/27/22     PLAN: PT FREQUENCY: 2x/week   PT DURATION: 12 weeks   PLANNED INTERVENTIONS: Therapeutic exercises, Therapeutic activity, Neuromuscular re-education, Balance training, Gait training, Patient/Family education, Joint mobilization, Orthotic/Fit training, DME instructions, Dry Needling, Electrical stimulation, Cryotherapy, Moist heat, Splintting, Taping, and Manual therapy    PLAN FOR NEXT SESSION: continue with moderate intensity functional strengthening and balance exercises as able, address back and right glute pain to allow return to functional strengthening and balance with better tolerance.  Re-measure function when patient is medically ready.    [name]. Ilsa Iha, PT, DPT 02/05/23, 2:59 PM  [facility] [address] [place], [place] [address] P: [phone] I F: [phone]

## 2023-02-12 ENCOUNTER — Ambulatory Visit: Payer: 59 | Admitting: Physical Therapy

## 2023-02-13 ENCOUNTER — Other Ambulatory Visit: Payer: Self-pay | Admitting: Internal Medicine

## 2023-02-13 DIAGNOSIS — Z0000 Encounter for general adult medical examination without abnormal findings: Secondary | ICD-10-CM | POA: Diagnosis not present

## 2023-02-13 DIAGNOSIS — Z1231 Encounter for screening mammogram for malignant neoplasm of breast: Secondary | ICD-10-CM

## 2023-02-13 DIAGNOSIS — Z9181 History of falling: Secondary | ICD-10-CM | POA: Diagnosis not present

## 2023-02-19 DIAGNOSIS — S81801A Unspecified open wound, right lower leg, initial encounter: Secondary | ICD-10-CM | POA: Diagnosis not present

## 2023-02-19 DIAGNOSIS — L03031 Cellulitis of right toe: Secondary | ICD-10-CM | POA: Diagnosis not present

## 2023-02-20 ENCOUNTER — Ambulatory Visit (INDEPENDENT_AMBULATORY_CARE_PROVIDER_SITE_OTHER): Payer: 59 | Admitting: Podiatry

## 2023-02-20 ENCOUNTER — Encounter: Payer: Self-pay | Admitting: Podiatry

## 2023-02-20 ENCOUNTER — Ambulatory Visit: Payer: 59 | Admitting: Physical Therapy

## 2023-02-20 DIAGNOSIS — L97521 Non-pressure chronic ulcer of other part of left foot limited to breakdown of skin: Secondary | ICD-10-CM

## 2023-02-20 MED ORDER — AMOXICILLIN-POT CLAVULANATE 875-125 MG PO TABS: 1.0000 | ORAL_TABLET | Freq: Two times a day (BID) | ORAL | 0 refills | Status: DC

## 2023-02-20 NOTE — Progress Notes (Signed)
She presents today states that she cut her cuticle about a month or so ago left a small wound on the hallux right which is got back bigger and progressed into a larger wound.  She was went to her primary doctor in [place] [facility] was put on antibiotics.  Her last hemoglobin A1c she states is down in the 7 range.  Objective: Pulses are palpable right foot demonstrates mild erythema around the hallux with a very weak extensor tendon previously noted.  She does have because of the tendon imbalance reactive hyperkeratosis around the tip of the toe which is also resulted in skin breakdown with a fissure.  There is no purulence no malodor there is mild erythema and delayed healing of this wound.  Assessment: Cellulitis hallux right.  Plan: I debrided all reactive hyperkeratotic tissue debrided any necrotic tissue.  She will receive Bactroban ointment and Augmentin.  She will dress the wound daily and use her Darco shoe I will follow-up with her in 2 weeks

## 2023-02-26 ENCOUNTER — Ambulatory Visit: Payer: 59 | Attending: Internal Medicine | Admitting: Physical Therapy

## 2023-02-28 ENCOUNTER — Ambulatory Visit: Payer: 59 | Admitting: Physical Therapy

## 2023-03-04 ENCOUNTER — Ambulatory Visit: Payer: 59 | Attending: Internal Medicine | Admitting: Physical Therapy

## 2023-03-06 ENCOUNTER — Ambulatory Visit: Payer: 59 | Attending: Internal Medicine | Admitting: Physical Therapy

## 2023-03-06 ENCOUNTER — Ambulatory Visit (INDEPENDENT_AMBULATORY_CARE_PROVIDER_SITE_OTHER): Payer: 59 | Admitting: Podiatry

## 2023-03-06 ENCOUNTER — Encounter: Payer: Self-pay | Admitting: Podiatry

## 2023-03-06 DIAGNOSIS — M6281 Muscle weakness (generalized): Secondary | ICD-10-CM | POA: Diagnosis not present

## 2023-03-06 DIAGNOSIS — L97521 Non-pressure chronic ulcer of other part of left foot limited to breakdown of skin: Secondary | ICD-10-CM | POA: Diagnosis not present

## 2023-03-06 DIAGNOSIS — Z9181 History of falling: Secondary | ICD-10-CM | POA: Diagnosis not present

## 2023-03-06 DIAGNOSIS — R2689 Other abnormalities of gait and mobility: Secondary | ICD-10-CM | POA: Diagnosis not present

## 2023-03-06 NOTE — Therapy (Unsigned)
OUTPATIENT PHYSICAL THERAPY TREATMENT / PROGRESS NOTE / RE-CERTIFICATION Reporting period from 10/17/2022 to 03/06/2023   Patient Name: [name] MRN: [id number] DOB:1975-06-30, 47 y.o., female Today's Date: 03/07/2023  PCP: Eunice Blase, PA-C REFERRING PROVIDER: Eunice Blase, PA-C  END OF SESSION:   PT End of Session - 03/06/23 1414     Visit Number 60    Number of Visits 64    Date for PT Re-Evaluation 05/29/23    Authorization Type [employer] MEDICARE/Medicaid reporting period from 10/17/2022    Progress Note Due on Visit 70    PT Start Time 1350    PT Stop Time 1428    PT Time Calculation (min) 38 min    Activity Tolerance Patient tolerated treatment well;Patient limited by fatigue    Behavior During Therapy WFL for tasks assessed/performed                 Past Medical History:  Diagnosis Date   Alcohol abuse    Anxiety    Anxiety    Anxiety and depression    Asthma    Bipolar disorder ([name])    Borderline personality disorder ([name])    Depression    Diabetes ([name])    Type 2    Dyslipidemia    Esophageal varices ([name])    Foot drop    right   [name] ([name])    GERD (gastroesophageal reflux disease)    Gout    History of drug abuse ([name])    Insomnia    Muscular dystrophies ([name])    Neuropathy    Pancreatitis    Recovering alcoholic ([name])    Past Surgical History:  Procedure Laterality Date   ESOPHAGOGASTRODUODENOSCOPY N/A 04/25/2022   Procedure: ESOPHAGOGASTRODUODENOSCOPY (EGD);  Surgeon: [name], MD;  Location: [facility] ENDOSCOPY;  Service: Gastroenterology;  Laterality: N/A;   HERNIA REPAIR     IR FLUORO GUIDE CV LINE RIGHT  12/30/2020   IR REMOVAL TUN CV CATH W/O FL  01/19/2021   IR US GUIDE VASC ACCESS RIGHT  12/30/2020   Patient Active Problem List   Diagnosis Date Noted   Gait abnormality 05/24/2022   Right hand weakness 05/24/2022   Frequent falls 02/12/2021   Leukocytosis    Aspiration pneumonia of both  lower lobes due to gastric secretions ([name]) 01/04/2021   Pressure injury of skin 12/24/2020   Acute hepatic encephalopathy ([name]) 12/20/2020   Sepsis ([name]) 12/19/2020   Acute hyponatremia 12/18/2020   Alcohol use disorder, moderate, dependence ([name]) 12/18/2020   Hepatic steatosis 12/18/2020   Polysubstance abuse ([name]) 12/18/2020   Hyperglycemia due to type 2 diabetes mellitus ([name]) 12/18/2020   Hyperbilirubinemia 12/18/2020   Lactic acidosis 12/18/2020   Alcoholic liver disease ([name]) 12/18/2020   Bipolar 1 disorder, manic, moderate ([name]) 09/10/2018   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   Diabetes ([name]) 09/10/2018   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   Chronic gout of foot 09/29/2015   Muscular dystrophy ([name]) 09/29/2015   Type 2 diabetes mellitus without complication ([name]) 09/29/2015   Anxiety and depression 10/01/2012   Protein-calorie malnutrition, severe ([name]) 09/30/2012   Trichomonas infection 09/26/2012   Dehydration 09/26/2012   Hypokalemia 11/14/2011   Thrombocytopenia ([name]) 11/14/2011   Bipolar 2 disorder ([name]) 11/13/2011   Gout 11/13/2011     REFERRING DIAG: bilateral leg weakness  THERAPY DIAG:  Other abnormalities of gait and mobility  Muscle weakness (generalized)  History of falling  Rationale for Evaluation and Treatment Rehabilitation  PERTINENT HISTORY:  Patient is a 47 y.o. female who presents to outpatient physical therapy with a referral for medical diagnosis bilateral leg weakness. This patient's chief complaints consist of difficulty with weakness and balance with history of frequent falling leading to the following functional deficits: difficulty with basic mobility including ambulation, stairs, transfers; difficulty with ADLs, IADLs. Currently requires assistance to navigate stairs, assistance with ADLs and  IADLs, and ambulates with RW when she previously did not need an assistive device. Relevant past medical history and  comorbidities include muscular dystrophy (FSHD - facioscapulohumeral muscular dystrophy), anxiety and depression, bipolar 1 disorder, borderline personality disorder, diabetes, GERD, gout, pancreatitis, alcoholic liver disease, tobacco abuse,  foot drop, neuropathy, recovering alcoholic, history of drug abuse, history of hernia repair.  Patient denies hx of cancer, stroke, seizures, lung problems, heart problems, unexplained weight loss, unexplained changes in bowel or bladder problems, unexplained stumbling or dropping things, osteoporosis, and spinal surgery.   PRECAUTIONS: Fall, not drink alcohol.  SUBJECTIVE: Patient arrives with rollator and right orthopedic shoe. She states she is really tired after walking without the walker yesterday and a little bit today. She was on her feet for a long time yesterday. She has not been able to come to PT recently due to not having a working vehicle and Dawn having conflicting appointments. Her right glute has been hurting a lot lately but not currently. She states she still feels incredibly unstable in her back. She states she feels like she is benefiting from PT. She has been doing her HEP sporadically. She has a wound on her right foot that is not healing well and is being managed by podiatry.   PAIN: NPRS 3/10 right glute to heel  OBJECTIVE   SELF-REPORTED FUNCTION FOTO score: 51/100 (NOC-Neuromuscular disorder questionnaire)  Patient Specific Functional Scale (PSFS)  Basic tidying and cleaning: 4 Stairs getting in and out of house: 4 Average: 4   FUNCTIONAL/BALANCE TESTS: 6 Minute Walk Test: 204 feet with rollator and steppage gait, R > L. R foot in orthopedic shoe. Stopped early due to fatigue and lower back feeling unstable. HR 130 bpm, and SpO2 98%.   Five Time Sit to Stand (5TSTS): 18 seconds with no UE support from 18.5 inch plinth. Did lean back of legs on plinth for stability for 1-2 reps.   Static balance (best of 2 trials): tandem stance,  eyes open: right foot front 35 seconds, left foot front 7 seconds.    Stairs: able to ascend/descend 4 steps in the clinic with heavy B UE support on handrails and supervision, step over step up and down with UE use on the way down.   Double leg press at seat position 1: 1RM (calculated): 120.3#     TODAY'S TREATMENT:  Therapeutic exercise: to centralize symptoms and improve ROM, strength, muscular endurance, and activity tolerance required for successful completion of functional activities.  - measurements to assess progress (see above).  - seated leg press, seat position 1:  Double leg stance: 1x20 at 35#,  1 min rest Double leg stance: 1x11 at 75#  1 min rest Double leg stance 1RM testing: 1x8 at 95# >1 min rest Double leg stance: 1x10 at 70# (~50% 1RM)  Pt required multimodal cuing for proper technique and to facilitate improved neuromuscular control, strength, range of motion, and functional ability resulting in improved performance and form.  PATIENT EDUCATION:  Education details: Exercise purpose/form. Self management techniques. Education on diagnosis, prognosis, POC, anatomy and physiology of current condition. Dry needling.  Person  educated: Patient Education method: Explanation, demonstration, verbal cuing, tactile cuing. Education comprehension: verbalized understanding, demonstrated understanding, and needs further education      HOME EXERCISE PROGRAM: Access Code: [email or web address] URL: [email or web address] Date: 08/20/2022 Prepared by: Norton Blizzard  Exercises - Sit to Stand Without Arm Support  - 3 x weekly - 3 sets - 5 reps - Seated Toe Raise  - 3 x weekly - 3 sets - 6 reps - 1 second hold - Supine Lower Trunk Rotation  - 3 x weekly - 1 sets - 20 reps - 1-5 seconds hold - Prone Press Up  - 3 x weekly - 2 sets - 5 reps    ASSESSMENT:   CLINICAL IMPRESSION:  Patient has attended 60 physical therapy sessions since starting current episode of care on  07/10/2021. Since last PT progress note she continues to struggle with PT attendance related to psychosocial factors that limit transportation and cause unpredictable conflicts in appointment times. She continues to have similar impairments and functional implantations that decrease her quality of life and and functional mobility. She continues to have rehab potential that could be actualized with improved attendance and participation in HEP. She has a chronic degenerative condition that requires physical therapy for optimal management. Plan to continue PT at a rate of 1-2x/week to maximize functional mobility and participation. Patient would benefit from continued management of limiting condition by skilled physical therapist to address remaining impairments and functional limitations to work towards stated goals and return to PLOF or maximal functional independence.   OBJECTIVE IMPAIRMENTS Abnormal gait, decreased activity tolerance, decreased balance, decreased coordination, decreased endurance, decreased mobility, difficulty walking, decreased ROM, decreased strength, impaired perceived functional ability, increased muscle spasms, impaired tone, impaired UE functional use, improper body mechanics, postural dysfunction, and pain.    ACTIVITY LIMITATIONS cleaning, community activity, driving, meal prep, laundry, and shopping.    PERSONAL FACTORS Behavior pattern, Fitness, Past/current experiences, Time since onset of injury/illness/exacerbation, Transportation, and 3+ comorbidities:    muscular dystrophy (FSHD - facioscapulohumeral muscular dystrophy), anxiety and depression, bipolar 1 disorder, borderline personality disorder, diabetes, GERD, gout, pancreatitis, alcoholic liver disease, tobacco abuse,  foot drop, neuropathy, recovering alcoholic, history of drug abuse, history of hernia repair are also affecting patient's functional outcome.      REHAB POTENTIAL: Good   CLINICAL DECISION MAKING:  Evolving/moderate complexity   EVALUATION COMPLEXITY: Moderate     GOALS: Goals reviewed with patient? No   SHORT TERM GOALS: Target date: 07/24/2021   Patient will be independent with initial home exercise program for self-management of symptoms. Baseline: Initial HEP to be provided at visit 2 as appropriate (07/10/21); Goal status: Met     LONG TERM GOALS: Target date: 10/02/2021; updated to 12/27/2021 for all unmet goals on 10/04/2021; updated to 03/26/2022 for all unmet goals on 01/01/2022; updated to 06/19/2022 for all unmet goals on 03/27/2022; updated to 08/14/2022 for all unmet goals on 05/22/2022, updated to 11/12/2022 for all unmet goals on 08/20/2022, updated to 01/09/2023 for all unmet goals on 10/17/2022, updated to 05/29/2023 for all unmet goals on 03/06/2023.    Patient will be independent with a long-term home exercise program for self-management of symptoms.  Baseline: Initial HEP to be provided at visit 2 as appropriate (07/10/21); participating as tolerated, recently too much pain to participate (09/04/2021); participating regularly (10/04/2021); participating as able (11/21/2021); not participating regularly over last couple of weeks (01/01/2022); no participating regularly since last progress note (02/15/2022); improving participation (03/27/2022); has not last two weeks due  to recent fall (05/17/22); has not been participating (08/20/2022); patient is participating in HEP 2x a week (10/17/2022); did not participate since last PT visit (01/22/2023); sporatic participation (03/06/2023);  Goal status:  In-progress   2.  Patient will demonstrate improved FOTO score by 10 points from baseline to demonstrate improvement in overall condition and self-reported functional ability.  Baseline: to be measured visit 2 as appropriate (07/10/21); 47 at visit # 2 (07/17/2021); 50 at visit # 10 (09/04/2021); 53 at visit #15 (10/04/2021); 50 at visit # 20 (11/21/2021); 52 at visit # 26 (01/01/2022); 50 at visit #30  (02/15/2022); 54 at visit # 34 (03/27/22);  53 (05/17/22); 51 at visit #44 (08/20/2022); 58 at visit #50 (10/17/2022); 51 at visit #60 (03/07/2023); Goal status: ongoing   3.  Patient will ambulate equal or greater than 1000 feet during 6 Min Walk Test using [facility] or less restrictive assistive device to improve community ambulation.  Baseline: To be tested visit 2 as appropriate (07/10/21); 310 feet with RW (07/17/2021); 556 feet with rollator (09/04/2021); 600 feet with rollator (10/04/2021); 300 feet with rollator - had to stop test at 3 min due to intolerable right glute pain (11/21/2021); 420 feet with rollator and steppage gait, R > L. Stopped at 5 min due to feeling of instability in low back to mid thoracic spine (01/01/2022); 573 feet with rollator and steppage gait, R > L. Occasional standing breaks. Low back pain increased to 3.5/10 (02/15/2022);  381 feet with rollator and steppage gait, R > L. Stopped at 4:30 min due to pain in low back and right glute (03/27/2022); 124' in 1 minute 22 sec. Had to discontinue due to R back/hip pain (05/17/2022); 200 feet with rollator, discontinued due to low back/glute pain (08/20/2022); 500 feet with rollator (10/17/2022); 204 feet with rollator and steppage gait, R > L. R foot in orthopedic shoe (03/06/2023);  Goal status: ongoing   4.  Patient will complete 5 Time Sit To Stand test in equal or less than 20 seconds with no UE support from 18.5 inch plinth to demonstrate improved LE functional strength and power for basic mobility and improved fall risk.  Baseline: 24 seconds with B UE on 18.5 inch plinth.  (07/10/21); 20 seconds with B UE on thighs from 18.5 inch plinth (09/04/2021); 19 seconds with B UE on thighs from 18.5 inch plinth (10/04/2021); 22 seconds with B UE on thighs from 18.5 inch plinth (11/21/2021); 20 seconds with B UE on thighs from 18.5 inch plinth (01/01/2022); 19 seconds without use of B UE support (02/15/2022); 21 seconds with no UE support from 18.5 inch plinth  (03/27/2022); 20.5 seconds no UE support and 18" surface (05/17/2022); 20 seconds with no UE support from 18.5 inch plinth (08/20/2022); 18 seconds with no UE support from 18.5 inch plinth. Did lean back of legs on plinth for stability for 1-2 reps (03/06/2023);  Goal status: in progress     5.  Patient be able to ascend and descend stairs at her home mod I with use of B handrails.  Baseline: able to ascend/descend 4 steps in the clinic with B UE support on handrails and supervision - reports currently needs assistance to descend steps at home. (07/10/21); able to ascend/descend 4 steps in the clinic with B UE support on handrails and supervision, leading up and down with left. able to perform with reversed LE leads (09/04/2021); able to ascend/descend 4 steps in the clinic with B UE support on handrails and supervision, step  over step with heavy UE use on the way down (10/04/2021); able to ascend/descend 4 steps in the clinic with B UE support on handrails and supervision, step over step up and step to leading with R LE with heavy UE use on the way down (11/21/2021); ascend/descends 4 steps in the clinic with B UE support on handrails and supervision, step over step up and down with heavy UE use on the way down (01/01/2022); able to ascend/descend 4 steps in the clinic with B UE support on handrails and supervision, step over step up and down with heavy UE use on the way down. leads up with right foot step to gait (02/15/2022; 03/27/22); able to ascend/descend 4 steps in the clinic with B UE support on handrails and supervision, step over step up and down with moderately heavy UE use on the way down (08/20/2022); able to ascend/descend 4 steps in the clinic with B UE support on handrails and supervision, step over step up and down with UE use on the way down (10/17/2022; 03/06/23);   Goal status: MET   6.  Patient will demonstrate ability to perform static balance in tandem stance for equal or greater than 30 seconds on  each side, to demonstrate improved balance and decreased fall risk.  Baseline: right foot front 10 seconds, left foot front 7 seconds (07/10/2021); right foot front 4 seconds, left foot front 6 seconds (09/04/2021); right foot front 9 seconds, left foot front 31 seconds (10/04/2021); right foot front 6.5 seconds, left foot front 8.5 seconds (11/21/2021); right foot front 8 seconds, left foot front 7 seconds (01/01/2022);  right foot front 9 seconds, left foot front 20 seconds (02/15/2022); right foot front 30 seconds, left foot front 45 seconds (03/27/2022); right foot front 16 seconds, left foot front 17 seconds (08/20/2022); right foot front 26 seconds, left foot front 43 seconds (10/17/2022); Right foot front 35 seconds, left foot front 7 seconds (03/06/2023);  Goal status: MET 03/27/22, regressed 03/16/23  7.  Patient will demonstrate improvement in Patient Specific Functional Scale (PSFS) of equal or greater than 3 points to reflect clinically significant improvement in patient's most valued functional activities. Baseline: 4 (03/06/2023); Goal status: NEW 03/06/2023  8.  Patient will demonstrate equal or greater than 10% improvement (132#) in calculated 1RM for double leg press to improve her LE strength for functional activities such as stairs and transfers.  Baseline: 120.3# (03/06/2023);  Goal status: NEW (03/06/2023)     PLAN: PT FREQUENCY: 2x/week   PT DURATION: 12 weeks   PLANNED INTERVENTIONS: Therapeutic exercises, Therapeutic activity, Neuromuscular re-education, Balance training, Gait training, Patient/Family education, Joint mobilization, Orthotic/Fit training, DME instructions, Dry Needling, Electrical stimulation, Cryotherapy, Moist heat, Splintting, Taping, and Manual therapy   PLAN FOR NEXT SESSION: progressive LE/functional strength/balance/muscular endurance as tolerated. Manual therapy for pain control as needed.    [name]. Ilsa Iha, PT, DPT 03/07/23, 1:24 PM  [facility]  Physical & [facility] Re[facility] [address] [place], [place] [address] P: [phone] I F: [phone]

## 2023-03-06 NOTE — Progress Notes (Signed)
She presents today for follow-up of her ulcer to the distal aspect of the hallux left.  She thinks it is doing some better.  Objective: Ulcerative lesion in the distal aspect of the toe appears to be healing.  I debrided all reactive hyperkeratotic tissue as well as the nail.  Assessment: Due to the muscular dystrophy she has dropped toe and dropfoot which is resulting in the problem.  But the ulcer appears to be slowly healing.  Plan: I highly recommended that she continue to treat this so to go on to heal may need to consider surgical intervention.

## 2023-03-07 ENCOUNTER — Encounter: Payer: Self-pay | Admitting: Physical Therapy

## 2023-03-14 ENCOUNTER — Ambulatory Visit: Payer: 59 | Admitting: Physical Therapy

## 2023-03-18 ENCOUNTER — Ambulatory Visit: Payer: 59 | Admitting: Physical Therapy

## 2023-03-19 ENCOUNTER — Ambulatory Visit: Payer: 59 | Admitting: Physical Therapy

## 2023-03-19 ENCOUNTER — Encounter: Payer: Self-pay | Admitting: Physical Therapy

## 2023-03-19 DIAGNOSIS — Z9181 History of falling: Secondary | ICD-10-CM

## 2023-03-19 DIAGNOSIS — M6281 Muscle weakness (generalized): Secondary | ICD-10-CM | POA: Diagnosis not present

## 2023-03-19 DIAGNOSIS — R2689 Other abnormalities of gait and mobility: Secondary | ICD-10-CM

## 2023-03-19 NOTE — Therapy (Signed)
 OUTPATIENT PHYSICAL THERAPY TREATMENT   Patient Name: [name] MRN: [id number] DOB:09/07/1975, 47 y.o., female Today's Date: 03/19/2023  PCP: Glenis Bridge, PA-C REFERRING PROVIDER: Glenis Bridge, PA-C  END OF SESSION:   PT End of Session - 03/19/23 1119     Visit Number 61    Number of Visits 64    Date for PT Re-Evaluation 05/29/23    Authorization Type [employer] MEDICARE/Medicaid reporting period from 10/17/2022    Progress Note Due on Visit 70    PT Start Time 1120    PT Stop Time 1200    PT Time Calculation (min) 40 min    Activity Tolerance Patient tolerated treatment well;Patient limited by fatigue    Behavior During Therapy WFL for tasks assessed/performed                Past Medical History:  Diagnosis Date   Alcohol abuse    Anxiety    Anxiety    Anxiety and depression    Asthma    Bipolar disorder ([name])    Borderline personality disorder ([name])    Depression    Diabetes ([name])    Type 2    Dyslipidemia    Esophageal varices ([name])    Foot drop    right   [name] ([name])    GERD (gastroesophageal reflux disease)    Gout    History of drug abuse ([name])    Insomnia    Muscular dystrophies ([name])    Neuropathy    Pancreatitis    Recovering alcoholic ([name])    Past Surgical History:  Procedure Laterality Date   ESOPHAGOGASTRODUODENOSCOPY N/A 04/25/2022   Procedure: ESOPHAGOGASTRODUODENOSCOPY (EGD);  Surgeon: Therisa Bi, MD;  Location: [facility] ENDOSCOPY;  Service: Gastroenterology;  Laterality: N/A;   HERNIA REPAIR     IR FLUORO GUIDE CV LINE RIGHT  12/30/2020   IR REMOVAL TUN CV CATH W/O FL  01/19/2021   IR US  GUIDE VASC ACCESS RIGHT  12/30/2020   Patient Active Problem List   Diagnosis Date Noted   Gait abnormality 05/24/2022   Right hand weakness 05/24/2022   Frequent falls 02/12/2021   Leukocytosis    Aspiration pneumonia of both lower lobes due to gastric secretions ([name]) 01/04/2021   Pressure injury of skin  12/24/2020   Acute hepatic encephalopathy ([name]) 12/20/2020   Sepsis ([name]) 12/19/2020   Acute hyponatremia 12/18/2020   Alcohol use disorder, moderate, dependence ([name]) 12/18/2020   Hepatic steatosis 12/18/2020   Polysubstance abuse ([name]) 12/18/2020   Hyperglycemia due to type 2 diabetes mellitus ([name]) 12/18/2020   Hyperbilirubinemia 12/18/2020   Lactic acidosis 12/18/2020   Alcoholic liver disease ([name]) 12/18/2020   Bipolar 1 disorder, manic, moderate ([name]) 09/10/2018   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   Diabetes ([name]) 09/10/2018   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   Chronic gout of foot 09/29/2015   Muscular dystrophy ([name]) 09/29/2015   Type 2 diabetes mellitus without complication ([name]) 09/29/2015   Anxiety and depression 10/01/2012   Protein-calorie malnutrition, severe ([name]) 09/30/2012   Trichomonas infection 09/26/2012   Dehydration 09/26/2012   Hypokalemia 11/14/2011   Thrombocytopenia ([name]) 11/14/2011   Bipolar 2 disorder ([name]) 11/13/2011   Gout 11/13/2011     REFERRING DIAG: bilateral leg weakness  THERAPY DIAG:  Other abnormalities of gait and mobility  Muscle weakness (generalized)  History of falling  Rationale for Evaluation and Treatment Rehabilitation  PERTINENT HISTORY: Patient is a 47 y.o. female who presents to outpatient physical therapy  with a referral for medical diagnosis bilateral leg weakness. This patient's chief complaints consist of difficulty with weakness and balance with history of frequent falling leading to the following functional deficits: difficulty with basic mobility including ambulation, stairs, transfers; difficulty with ADLs, IADLs. Currently requires assistance to navigate stairs, assistance with ADLs and  IADLs, and ambulates with RW when she previously did not need an assistive device. Relevant past medical history and comorbidities include muscular dystrophy (FSHD - facioscapulohumeral muscular  dystrophy), anxiety and depression, bipolar 1 disorder, borderline personality disorder, diabetes, GERD, gout, pancreatitis, alcoholic liver disease, tobacco abuse,  foot drop, neuropathy, recovering alcoholic, history of drug abuse, history of hernia repair.  Patient denies hx of cancer, stroke, seizures, lung problems, heart problems, unexplained weight loss, unexplained changes in bowel or bladder problems, unexplained stumbling or dropping things, osteoporosis, and spinal surgery.   PRECAUTIONS: Fall, not drink alcohol.  SUBJECTIVE: Patient arrives with rollator and right orthopedic shoe. She states she is hurting today and started hurting more for no apparent reason yesterday. Her right toe is healing and getting better slowly.   PAIN: NPRS 6/10 lower back, right glute to posterior thigh  OBJECTIVE   1 RM (calculated):  Double leg press at seat position 1: 120.3# (last measured 03/06/2023)     TODAY'S TREATMENT:  Therapeutic exercise: to centralize symptoms and improve ROM, strength, muscular endurance, and activity tolerance required for successful completion of functional activities.  - seated leg press, seat position 1:  Double leg stance: 1x20 at 35#  1 min rest Double leg stance: 1x20 at 70# (~60% 1RM) 1 min rest Double leg stance: 1x20 at 70# (~60% 1RM) 2 min rest Double leg stance: 1x20 at 70# (~60% 1RM)  Pt required multimodal cuing for proper technique and to facilitate improved neuromuscular control, strength, range of motion, and functional ability resulting in improved performance and form.  Manual therapy: to reduce pain and tissue tension, improve range of motion, neuromodulation, in order to promote improved ability to complete functional activities. PRONE - STM to bilateral lumbar paraspinals and right posterior hip musculature including glutes and deep hip rotators.    Modality: (unbilled) Dry needling performed to low back and right piriformis to decrease pain  and spasms along patient's low back and right glute region with patient in prone utilizing 3 dry needle(s) .30mm x with 1 stick each level  each side at lumbar multifidi at L4 and L5, 1 stick at right piriformis and 1 stick just lateral to right SIJ. Patient educated about the risks and benefits from therapy and verbally consents to treatment and draped appropriately. Dry needling performed by Camie SAUNDERS. Juli PT, DPT who is certified in this technique.     PATIENT EDUCATION:  Education details: Exercise purpose/form. Self management techniques. Education on diagnosis, prognosis, POC, anatomy and physiology of current condition.  Person educated: Patient Education method: Explanation, demonstration, verbal cuing, tactile cuing. Education comprehension: verbalized understanding, demonstrated understanding, and needs further education      HOME EXERCISE PROGRAM: Access Code: [email or web address] URL: [email or web address] Date: 08/20/2022 Prepared by: Camie Juli  Exercises - Sit to Stand Without Arm Support  - 3 x weekly - 3 sets - 5 reps - Seated Toe Raise  - 3 x weekly - 3 sets - 6 reps - 1 second hold - Supine Lower Trunk Rotation  - 3 x weekly - 1 sets - 20 reps - 1-5 seconds hold - Prone Press Up  - 3 x weekly - 2 sets -  5 reps    ASSESSMENT:   CLINICAL IMPRESSION:  Patient arrives with increased pain from the last 2 days and inability to get to PT last week due to her roommate/transportation experiencing a fall as she was on her way. Today's session focused on LE strengthening and pain control. Patient was able to advance exercise volume with strong fatigue felt in BLE. She responded well to dry needling with no unusual response or incident. She was encouraged to continue her efforts to attend PT regularly, but she does have many psychosocial barriers. She continues to have  rehab potential to improve her functional mobility with continued  PT. .cpont  OBJECTIVE IMPAIRMENTS  Abnormal gait, decreased activity tolerance, decreased balance, decreased coordination, decreased endurance, decreased mobility, difficulty walking, decreased ROM, decreased strength, impaired perceived functional ability, increased muscle spasms, impaired tone, impaired UE functional use, improper body mechanics, postural dysfunction, and pain.    ACTIVITY LIMITATIONS cleaning, community activity, driving, meal prep, laundry, and shopping.    PERSONAL FACTORS Behavior pattern, Fitness, Past/current experiences, Time since onset of injury/illness/exacerbation, Transportation, and 3+ comorbidities:    muscular dystrophy (FSHD - facioscapulohumeral muscular dystrophy), anxiety and depression, bipolar 1 disorder, borderline personality disorder, diabetes, GERD, gout, pancreatitis, alcoholic liver disease, tobacco abuse,  foot drop, neuropathy, recovering alcoholic, history of drug abuse, history of hernia repair are also affecting patient's functional outcome.      REHAB POTENTIAL: Good   CLINICAL DECISION MAKING: Evolving/moderate complexity   EVALUATION COMPLEXITY: Moderate     GOALS: Goals reviewed with patient? No   SHORT TERM GOALS: Target date: 07/24/2021   Patient will be independent with initial home exercise program for self-management of symptoms. Baseline: Initial HEP to be provided at visit 2 as appropriate (07/10/21); Goal status: Met     LONG TERM GOALS: Target date: 10/02/2021; updated to 12/27/2021 for all unmet goals on 10/04/2021; updated to 03/26/2022 for all unmet goals on 01/01/2022; updated to 06/19/2022 for all unmet goals on 03/27/2022; updated to 08/14/2022 for all unmet goals on 05/22/2022, updated to 11/12/2022 for all unmet goals on 08/20/2022, updated to 01/09/2023 for all unmet goals on 10/17/2022, updated to 05/29/2023 for all unmet goals on 03/06/2023.    Patient will be independent with a long-term home exercise program for self-management of symptoms.  Baseline: Initial HEP to  be provided at visit 2 as appropriate (07/10/21); participating as tolerated, recently too much pain to participate (09/04/2021); participating regularly (10/04/2021); participating as able (11/21/2021); not participating regularly over last couple of weeks (01/01/2022); no participating regularly since last progress note (02/15/2022); improving participation (03/27/2022); has not last two weeks due to recent fall (05/17/22); has not been participating (08/20/2022); patient is participating in HEP 2x a week (10/17/2022); did not participate since last PT visit (01/22/2023); sporatic participation (03/06/2023);  Goal status:  In-progress   2.  Patient will demonstrate improved FOTO score by 10 points from baseline to demonstrate improvement in overall condition and self-reported functional ability.  Baseline: to be measured visit 2 as appropriate (07/10/21); 47 at visit # 2 (07/17/2021); 50 at visit # 10 (09/04/2021); 53 at visit #15 (10/04/2021); 50 at visit # 20 (11/21/2021); 52 at visit # 26 (01/01/2022); 50 at visit #30 (02/15/2022); 54 at visit # 34 (03/27/22);  53 (05/17/22); 51 at visit #44 (08/20/2022); 58 at visit #50 (10/17/2022); 51 at visit #60 (03/07/2023); Goal status: ongoing   3.  Patient will ambulate equal or greater than 1000 feet during 6 Min Walk Test using [facility]  or less restrictive assistive device to improve community ambulation.  Baseline: To be tested visit 2 as appropriate (07/10/21); 310 feet with RW (07/17/2021); 556 feet with rollator (09/04/2021); 600 feet with rollator (10/04/2021); 300 feet with rollator - had to stop test at 3 min due to intolerable right glute pain (11/21/2021); 420 feet with rollator and steppage gait, R > L. Stopped at 5 min due to feeling of instability in low back to mid thoracic spine (01/01/2022); 573 feet with rollator and steppage gait, R > L. Occasional standing breaks. Low back pain increased to 3.5/10 (02/15/2022);  381 feet with rollator and steppage gait, R > L. Stopped at  4:30 min due to pain in low back and right glute (03/27/2022); 124' in 1 minute 22 sec. Had to discontinue due to R back/hip pain (05/17/2022); 200 feet with rollator, discontinued due to low back/glute pain (08/20/2022); 500 feet with rollator (10/17/2022); 204 feet with rollator and steppage gait, R > L. R foot in orthopedic shoe (03/06/2023);  Goal status: ongoing   4.  Patient will complete 5 Time Sit To Stand test in equal or less than 20 seconds with no UE support from 18.5 inch plinth to demonstrate improved LE functional strength and power for basic mobility and improved fall risk.  Baseline: 24 seconds with B UE on 18.5 inch plinth.  (07/10/21); 20 seconds with B UE on thighs from 18.5 inch plinth (09/04/2021); 19 seconds with B UE on thighs from 18.5 inch plinth (10/04/2021); 22 seconds with B UE on thighs from 18.5 inch plinth (11/21/2021); 20 seconds with B UE on thighs from 18.5 inch plinth (01/01/2022); 19 seconds without use of B UE support (02/15/2022); 21 seconds with no UE support from 18.5 inch plinth (03/27/2022); 20.5 seconds no UE support and 18 surface (05/17/2022); 20 seconds with no UE support from 18.5 inch plinth (08/20/2022); 18 seconds with no UE support from 18.5 inch plinth. Did lean back of legs on plinth for stability for 1-2 reps (03/06/2023);  Goal status: in progress     5.  Patient be able to ascend and descend stairs at her home mod I with use of B handrails.  Baseline: able to ascend/descend 4 steps in the clinic with B UE support on handrails and supervision - reports currently needs assistance to descend steps at home. (07/10/21); able to ascend/descend 4 steps in the clinic with B UE support on handrails and supervision, leading up and down with left. able to perform with reversed LE leads (09/04/2021); able to ascend/descend 4 steps in the clinic with B UE support on handrails and supervision, step over step with heavy UE use on the way down (10/04/2021); able to ascend/descend 4  steps in the clinic with B UE support on handrails and supervision, step over step up and step to leading with R LE with heavy UE use on the way down (11/21/2021); ascend/descends 4 steps in the clinic with B UE support on handrails and supervision, step over step up and down with heavy UE use on the way down (01/01/2022); able to ascend/descend 4 steps in the clinic with B UE support on handrails and supervision, step over step up and down with heavy UE use on the way down. leads up with right foot step to gait (02/15/2022; 03/27/22); able to ascend/descend 4 steps in the clinic with B UE support on handrails and supervision, step over step up and down with moderately heavy UE use on the way down (08/20/2022); able to  ascend/descend 4 steps in the clinic with B UE support on handrails and supervision, step over step up and down with UE use on the way down (10/17/2022; 03/06/23);   Goal status: MET   6.  Patient will demonstrate ability to perform static balance in tandem stance for equal or greater than 30 seconds on each side, to demonstrate improved balance and decreased fall risk.  Baseline: right foot front 10 seconds, left foot front 7 seconds (07/10/2021); right foot front 4 seconds, left foot front 6 seconds (09/04/2021); right foot front 9 seconds, left foot front 31 seconds (10/04/2021); right foot front 6.5 seconds, left foot front 8.5 seconds (11/21/2021); right foot front 8 seconds, left foot front 7 seconds (01/01/2022);  right foot front 9 seconds, left foot front 20 seconds (02/15/2022); right foot front 30 seconds, left foot front 45 seconds (03/27/2022); right foot front 16 seconds, left foot front 17 seconds (08/20/2022); right foot front 26 seconds, left foot front 43 seconds (10/17/2022); Right foot front 35 seconds, left foot front 7 seconds (03/06/2023);  Goal status: MET 03/27/22, regressed 03/16/23  7.  Patient will demonstrate improvement in Patient Specific Functional Scale (PSFS) of equal or greater  than 3 points to reflect clinically significant improvement in patient's most valued functional activities. Baseline: 4 (03/06/2023); Goal status: NEW 03/06/2023  8.  Patient will demonstrate equal or greater than 10% improvement (132#) in calculated 1RM for double leg press to improve her LE strength for functional activities such as stairs and transfers.  Baseline: 120.3# (03/06/2023);  Goal status: NEW (03/06/2023)     PLAN: PT FREQUENCY: 2x/week   PT DURATION: 12 weeks   PLANNED INTERVENTIONS: Therapeutic exercises, Therapeutic activity, Neuromuscular re-education, Balance training, Gait training, Patient/Family education, Joint mobilization, Orthotic/Fit training, DME instructions, Dry Needling, Electrical stimulation, Cryotherapy, Moist heat, Splintting, Taping, and Manual therapy   PLAN FOR NEXT SESSION: progressive LE/functional strength/balance/muscular endurance as tolerated. Manual therapy for pain control as needed.    Camie SAUNDERS. Juli, PT, DPT 03/19/23, 8:53 PM  [facility] [address] [place], [place] [address] P: [phone] I F: [phone]

## 2023-03-21 ENCOUNTER — Ambulatory Visit: Payer: 59 | Admitting: Physical Therapy

## 2023-03-25 ENCOUNTER — Encounter: Payer: Self-pay | Admitting: Physical Therapy

## 2023-03-25 ENCOUNTER — Ambulatory Visit: Payer: 59 | Attending: Internal Medicine | Admitting: Physical Therapy

## 2023-03-25 DIAGNOSIS — M6281 Muscle weakness (generalized): Secondary | ICD-10-CM | POA: Diagnosis not present

## 2023-03-25 DIAGNOSIS — Z9181 History of falling: Secondary | ICD-10-CM | POA: Diagnosis not present

## 2023-03-25 DIAGNOSIS — R2689 Other abnormalities of gait and mobility: Secondary | ICD-10-CM | POA: Diagnosis not present

## 2023-03-25 NOTE — Patient Instructions (Signed)

## 2023-03-25 NOTE — Therapy (Signed)
 OUTPATIENT PHYSICAL THERAPY TREATMENT   Patient Name: [name] MRN: [id number] DOB:1975/06/10, 48 y.o., female Today's Date: 03/26/2023  PCP: Glenis Bridge, PA-C REFERRING PROVIDER: Glenis Bridge, PA-C  END OF SESSION:   PT End of Session - 03/25/23 1313     Visit Number 2    Number of Visits 64    Date for PT Re-Evaluation 05/29/23    Authorization Type [employer] MEDICARE/Medicaid reporting period from 10/17/2022    Progress Note Due on Visit 70    PT Start Time 1303    PT Stop Time 1341    PT Time Calculation (min) 38 min    Activity Tolerance Patient tolerated treatment well;Patient limited by fatigue    Behavior During Therapy WFL for tasks assessed/performed                 Past Medical History:  Diagnosis Date   Alcohol abuse    Anxiety    Anxiety    Anxiety and depression    Asthma    Bipolar disorder ([name])    Borderline personality disorder ([name])    Depression    Diabetes ([name])    Type 2    Dyslipidemia    Esophageal varices ([name])    Foot drop    right   [name] ([name])    GERD (gastroesophageal reflux disease)    Gout    History of drug abuse ([name])    Insomnia    Muscular dystrophies ([name])    Neuropathy    Pancreatitis    Recovering alcoholic ([name])    Past Surgical History:  Procedure Laterality Date   ESOPHAGOGASTRODUODENOSCOPY N/A 04/25/2022   Procedure: ESOPHAGOGASTRODUODENOSCOPY (EGD);  Surgeon: Therisa Bi, MD;  Location: [facility] ENDOSCOPY;  Service: Gastroenterology;  Laterality: N/A;   HERNIA REPAIR     IR FLUORO GUIDE CV LINE RIGHT  12/30/2020   IR REMOVAL TUN CV CATH W/O FL  01/19/2021   IR US  GUIDE VASC ACCESS RIGHT  12/30/2020   Patient Active Problem List   Diagnosis Date Noted   Gait abnormality 05/24/2022   Right hand weakness 05/24/2022   Frequent falls 02/12/2021   Leukocytosis    Aspiration pneumonia of both lower lobes due to gastric secretions ([name]) 01/04/2021   Pressure injury of skin  12/24/2020   Acute hepatic encephalopathy ([name]) 12/20/2020   Sepsis ([name]) 12/19/2020   Acute hyponatremia 12/18/2020   Alcohol use disorder, moderate, dependence ([name]) 12/18/2020   Hepatic steatosis 12/18/2020   Polysubstance abuse ([name]) 12/18/2020   Hyperglycemia due to type 2 diabetes mellitus ([name]) 12/18/2020   Hyperbilirubinemia 12/18/2020   Lactic acidosis 12/18/2020   Alcoholic liver disease ([name]) 12/18/2020   Bipolar 1 disorder, manic, moderate ([name]) 09/10/2018   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   Diabetes ([name]) 09/10/2018   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   Chronic gout of foot 09/29/2015   Muscular dystrophy ([name]) 09/29/2015   Type 2 diabetes mellitus without complication ([name]) 09/29/2015   Anxiety and depression 10/01/2012   Protein-calorie malnutrition, severe ([name]) 09/30/2012   Trichomonas infection 09/26/2012   Dehydration 09/26/2012   Hypokalemia 11/14/2011   Thrombocytopenia ([name]) 11/14/2011   Bipolar 2 disorder ([name]) 11/13/2011   Gout 11/13/2011     REFERRING DIAG: bilateral leg weakness  THERAPY DIAG:  Other abnormalities of gait and mobility  Muscle weakness (generalized)  History of falling  Rationale for Evaluation and Treatment Rehabilitation  PERTINENT HISTORY: Patient is a 48 y.o. female who presents to outpatient physical  therapy with a referral for medical diagnosis bilateral leg weakness. This patient's chief complaints consist of difficulty with weakness and balance with history of frequent falling leading to the following functional deficits: difficulty with basic mobility including ambulation, stairs, transfers; difficulty with ADLs, IADLs. Currently requires assistance to navigate stairs, assistance with ADLs and  IADLs, and ambulates with RW when she previously did not need an assistive device. Relevant past medical history and comorbidities include muscular dystrophy (FSHD - facioscapulohumeral muscular  dystrophy), anxiety and depression, bipolar 1 disorder, borderline personality disorder, diabetes, GERD, gout, pancreatitis, alcoholic liver disease, tobacco abuse,  foot drop, neuropathy, recovering alcoholic, history of drug abuse, history of hernia repair.  Patient denies hx of cancer, stroke, seizures, lung problems, heart problems, unexplained weight loss, unexplained changes in bowel or bladder problems, unexplained stumbling or dropping things, osteoporosis, and spinal surgery.   PRECAUTIONS: Fall, not drink alcohol.  SUBJECTIVE: Patient states she is hurting today. She stopped wearing her ortho shoe because she was tired of wearing it. She states her toe is getting better. She states her pain was much improved after last PT session until the next day. She has not been doing her HEP.   PAIN: NPRS 7/10 lower back, right glute to ankle  OBJECTIVE   1 RM (calculated):  Double leg press at seat position 1: 120.3# (last measured 03/06/2023)     TODAY'S TREATMENT:  Therapeutic exercise: to centralize symptoms and improve ROM, strength, muscular endurance, and activity tolerance required for successful completion of functional activities.  - seated leg press, seat position 1:  Double leg stance: 1x20 at 35#  1 min rest Double leg stance: 1x20 at 85# (~70% 1RM) 1 min rest Double leg stance: 1x11 at 85# (~70% 1RM) - felt like legs would not make it another push.  2 min rest Double leg stance: 1x20 at 70# (~60% 1RM)  Therapeutic activities: dynamic activities for functional strengthening and improved functional activity tolerance. Sled push, three times 2x30 feet with seated rest between sets and PT assistance with turning sled. Used tall upright poles.   Pt required multimodal cuing for proper technique and to facilitate improved neuromuscular control, strength, range of motion, and functional ability resulting in improved performance and form.  Manual therapy: to reduce pain and tissue  tension, improve range of motion, neuromodulation, in order to promote improved ability to complete functional activities. PRONE - STM to bilateral lumbar paraspinals and right posterior hip musculature including glutes and deep hip rotators.   Trigger Point Dry Needling  Initial Treatment: Pt instructed on Dry Needling rational, procedures, and possible side effects. Pt instructed to expect mild to moderate muscle soreness later in the day and/or into the next day.  Pt instructed in methods to reduce muscle soreness. Pt instructed to continue prescribed HEP. Patient was educated on signs and symptoms of infection and other risk factors and advised to seek medical attention should they occur.  Patient verbalized understanding of these instructions and education.   Patient Verbal Consent Given: Yes Education Handout Provided: Yes Muscles Treated: bilateral multifidi at L4 and L4, and right glute max and posterior fibers of glute med just lateral to R SIJ to decrease pain and spasms along patient's back and right LE region with patient in prone utilizing 3 dry needle(s) .30mm x .  Electrical Stimulation Performed: No Treatment Response/Outcome: reduction in pain    PATIENT EDUCATION:  Education details: Exercise purpose/form. Self management techniques. Education on diagnosis, prognosis, POC, anatomy and physiology of  current condition.  Person educated: Patient Education method: Explanation, demonstration, verbal cuing, tactile cuing. Education comprehension: verbalized understanding, demonstrated understanding, and needs further education      HOME EXERCISE PROGRAM: Access Code: [email or web address] URL: [email or web address] Date: 08/20/2022 Prepared by: Camie Cleverly  Exercises - Sit to Stand Without Arm Support  - 3 x weekly - 3 sets - 5 reps - Seated Toe Raise  - 3 x weekly - 3 sets - 6 reps - 1 second hold - Supine Lower Trunk Rotation  - 3 x weekly - 1 sets - 20 reps -  1-5 seconds hold - Prone Press Up  - 3 x weekly - 2 sets - 5 reps    ASSESSMENT:   CLINICAL IMPRESSION:  Patient arrives with continued pain in the low back, right glute and LE that was improved slightly with dry needling. Today's session focused on continued LE and functoinal strengthening with manual and dry needling for pain control. Patient was appropriately challenged with strengthening exercises, and was able to progress them slightly. Plan to continue with B LE strengthening next visit. Patient would benefit from continued management of limiting condition by skilled physical therapist to address remaining impairments and functional limitations to work towards stated goals and return to PLOF or maximal functional independence.   OBJECTIVE IMPAIRMENTS Abnormal gait, decreased activity tolerance, decreased balance, decreased coordination, decreased endurance, decreased mobility, difficulty walking, decreased ROM, decreased strength, impaired perceived functional ability, increased muscle spasms, impaired tone, impaired UE functional use, improper body mechanics, postural dysfunction, and pain.    ACTIVITY LIMITATIONS cleaning, community activity, driving, meal prep, laundry, and shopping.    PERSONAL FACTORS Behavior pattern, Fitness, Past/current experiences, Time since onset of injury/illness/exacerbation, Transportation, and 3+ comorbidities:    muscular dystrophy (FSHD - facioscapulohumeral muscular dystrophy), anxiety and depression, bipolar 1 disorder, borderline personality disorder, diabetes, GERD, gout, pancreatitis, alcoholic liver disease, tobacco abuse,  foot drop, neuropathy, recovering alcoholic, history of drug abuse, history of hernia repair are also affecting patient's functional outcome.      REHAB POTENTIAL: Good   CLINICAL DECISION MAKING: Evolving/moderate complexity   EVALUATION COMPLEXITY: Moderate     GOALS: Goals reviewed with patient? No   SHORT TERM GOALS:  Target date: 07/24/2021   Patient will be independent with initial home exercise program for self-management of symptoms. Baseline: Initial HEP to be provided at visit 2 as appropriate (07/10/21); Goal status: Met     LONG TERM GOALS: Target date: 10/02/2021; updated to 12/27/2021 for all unmet goals on 10/04/2021; updated to 03/26/2022 for all unmet goals on 01/01/2022; updated to 06/19/2022 for all unmet goals on 03/27/2022; updated to 08/14/2022 for all unmet goals on 05/22/2022, updated to 11/12/2022 for all unmet goals on 08/20/2022, updated to 01/09/2023 for all unmet goals on 10/17/2022, updated to 05/29/2023 for all unmet goals on 03/06/2023.    Patient will be independent with a long-term home exercise program for self-management of symptoms.  Baseline: Initial HEP to be provided at visit 2 as appropriate (07/10/21); participating as tolerated, recently too much pain to participate (09/04/2021); participating regularly (10/04/2021); participating as able (11/21/2021); not participating regularly over last couple of weeks (01/01/2022); no participating regularly since last progress note (02/15/2022); improving participation (03/27/2022); has not last two weeks due to recent fall (05/17/22); has not been participating (08/20/2022); patient is participating in HEP 2x a week (10/17/2022); did not participate since last PT visit (01/22/2023); sporatic participation (03/06/2023);  Goal status:  In-progress   2.  Patient will demonstrate  improved FOTO score by 10 points from baseline to demonstrate improvement in overall condition and self-reported functional ability.  Baseline: to be measured visit 2 as appropriate (07/10/21); 46 at visit # 2 (07/17/2021); 50 at visit # 10 (09/04/2021); 53 at visit #15 (10/04/2021); 50 at visit # 20 (11/21/2021); 52 at visit # 26 (01/01/2022); 50 at visit #30 (02/15/2022); 54 at visit # 34 (03/27/22);  53 (05/17/22); 51 at visit #44 (08/20/2022); 58 at visit #50 (10/17/2022); 51 at visit #60  (03/07/2023); Goal status: ongoing   3.  Patient will ambulate equal or greater than 1000 feet during 6 Min Walk Test using [facility] or less restrictive assistive device to improve community ambulation.  Baseline: To be tested visit 2 as appropriate (07/10/21); 310 feet with RW (07/17/2021); 556 feet with rollator (09/04/2021); 600 feet with rollator (10/04/2021); 300 feet with rollator - had to stop test at 3 min due to intolerable right glute pain (11/21/2021); 420 feet with rollator and steppage gait, R > L. Stopped at 5 min due to feeling of instability in low back to mid thoracic spine (01/01/2022); 573 feet with rollator and steppage gait, R > L. Occasional standing breaks. Low back pain increased to 3.5/10 (02/15/2022);  381 feet with rollator and steppage gait, R > L. Stopped at 4:30 min due to pain in low back and right glute (03/27/2022); 124' in 1 minute 22 sec. Had to discontinue due to R back/hip pain (05/17/2022); 200 feet with rollator, discontinued due to low back/glute pain (08/20/2022); 500 feet with rollator (10/17/2022); 204 feet with rollator and steppage gait, R > L. R foot in orthopedic shoe (03/06/2023);  Goal status: ongoing   4.  Patient will complete 5 Time Sit To Stand test in equal or less than 20 seconds with no UE support from 18.5 inch plinth to demonstrate improved LE functional strength and power for basic mobility and improved fall risk.  Baseline: 24 seconds with B UE on 18.5 inch plinth.  (07/10/21); 20 seconds with B UE on thighs from 18.5 inch plinth (09/04/2021); 19 seconds with B UE on thighs from 18.5 inch plinth (10/04/2021); 22 seconds with B UE on thighs from 18.5 inch plinth (11/21/2021); 20 seconds with B UE on thighs from 18.5 inch plinth (01/01/2022); 19 seconds without use of B UE support (02/15/2022); 21 seconds with no UE support from 18.5 inch plinth (03/27/2022); 20.5 seconds no UE support and 18 surface (05/17/2022); 20 seconds with no UE support from 18.5 inch plinth  (08/20/2022); 18 seconds with no UE support from 18.5 inch plinth. Did lean back of legs on plinth for stability for 1-2 reps (03/06/2023);  Goal status: in progress     5.  Patient be able to ascend and descend stairs at her home mod I with use of B handrails.  Baseline: able to ascend/descend 4 steps in the clinic with B UE support on handrails and supervision - reports currently needs assistance to descend steps at home. (07/10/21); able to ascend/descend 4 steps in the clinic with B UE support on handrails and supervision, leading up and down with left. able to perform with reversed LE leads (09/04/2021); able to ascend/descend 4 steps in the clinic with B UE support on handrails and supervision, step over step with heavy UE use on the way down (10/04/2021); able to ascend/descend 4 steps in the clinic with B UE support on handrails and supervision, step over step up and step to leading with R LE with heavy UE  use on the way down (11/21/2021); ascend/descends 4 steps in the clinic with B UE support on handrails and supervision, step over step up and down with heavy UE use on the way down (01/01/2022); able to ascend/descend 4 steps in the clinic with B UE support on handrails and supervision, step over step up and down with heavy UE use on the way down. leads up with right foot step to gait (02/15/2022; 03/27/22); able to ascend/descend 4 steps in the clinic with B UE support on handrails and supervision, step over step up and down with moderately heavy UE use on the way down (08/20/2022); able to ascend/descend 4 steps in the clinic with B UE support on handrails and supervision, step over step up and down with UE use on the way down (10/17/2022; 03/06/23);   Goal status: MET   6.  Patient will demonstrate ability to perform static balance in tandem stance for equal or greater than 30 seconds on each side, to demonstrate improved balance and decreased fall risk.  Baseline: right foot front 10 seconds, left foot  front 7 seconds (07/10/2021); right foot front 4 seconds, left foot front 6 seconds (09/04/2021); right foot front 9 seconds, left foot front 31 seconds (10/04/2021); right foot front 6.5 seconds, left foot front 8.5 seconds (11/21/2021); right foot front 8 seconds, left foot front 7 seconds (01/01/2022);  right foot front 9 seconds, left foot front 20 seconds (02/15/2022); right foot front 30 seconds, left foot front 45 seconds (03/27/2022); right foot front 16 seconds, left foot front 17 seconds (08/20/2022); right foot front 26 seconds, left foot front 43 seconds (10/17/2022); Right foot front 35 seconds, left foot front 7 seconds (03/06/2023);  Goal status: MET 03/27/22, regressed 03/16/23  7.  Patient will demonstrate improvement in Patient Specific Functional Scale (PSFS) of equal or greater than 3 points to reflect clinically significant improvement in patient's most valued functional activities. Baseline: 4 (03/06/2023); Goal status: NEW 03/06/2023  8.  Patient will demonstrate equal or greater than 10% improvement (132#) in calculated 1RM for double leg press to improve her LE strength for functional activities such as stairs and transfers.  Baseline: 120.3# (03/06/2023);  Goal status: NEW (03/06/2023)     PLAN: PT FREQUENCY: 2x/week   PT DURATION: 12 weeks   PLANNED INTERVENTIONS: Therapeutic exercises, Therapeutic activity, Neuromuscular re-education, Balance training, Gait training, Patient/Family education, Joint mobilization, Orthotic/Fit training, DME instructions, Dry Needling, Electrical stimulation, Cryotherapy, Moist heat, Splintting, Taping, and Manual therapy   PLAN FOR NEXT SESSION: progressive LE/functional strength/balance/muscular endurance as tolerated. Manual therapy for pain control as needed.    Camie SAUNDERS. Juli, PT, DPT 03/26/23, 1:49 PM  [facility] [address] [place], [place] [address] P: [phone] I F: [phone]

## 2023-03-26 ENCOUNTER — Encounter: Payer: Self-pay | Admitting: Physical Therapy

## 2023-03-27 ENCOUNTER — Encounter: Payer: Self-pay | Admitting: Physical Therapy

## 2023-03-27 ENCOUNTER — Ambulatory Visit: Payer: 59 | Admitting: Physical Therapy

## 2023-03-27 VITALS — BP 105/70 | HR 104

## 2023-03-27 DIAGNOSIS — R2689 Other abnormalities of gait and mobility: Secondary | ICD-10-CM | POA: Diagnosis not present

## 2023-03-27 DIAGNOSIS — Z9181 History of falling: Secondary | ICD-10-CM | POA: Diagnosis not present

## 2023-03-27 DIAGNOSIS — M6281 Muscle weakness (generalized): Secondary | ICD-10-CM

## 2023-03-27 NOTE — Therapy (Signed)
 OUTPATIENT PHYSICAL THERAPY TREATMENT   Patient Name: [name] MRN: [id number] DOB:October 31, 1975, 48 y.o., female Today's Date: 03/28/2023  PCP: Glenis Bridge, PA-C REFERRING PROVIDER: Glenis Bridge, PA-C  END OF SESSION:   PT End of Session - 03/27/23 1354     Visit Number 63    Number of Visits 64    Date for PT Re-Evaluation 05/29/23    Authorization Type [employer] MEDICARE/Medicaid reporting period from 10/17/2022    Progress Note Due on Visit 70    PT Start Time 1349    PT Stop Time 1429    PT Time Calculation (min) 40 min    Activity Tolerance Patient tolerated treatment well;Patient limited by fatigue    Behavior During Therapy WFL for tasks assessed/performed                  Past Medical History:  Diagnosis Date   Alcohol abuse    Anxiety    Anxiety    Anxiety and depression    Asthma    Bipolar disorder ([name])    Borderline personality disorder ([name])    Depression    Diabetes ([name])    Type 2    Dyslipidemia    Esophageal varices ([name])    Foot drop    right   [name] ([name])    GERD (gastroesophageal reflux disease)    Gout    History of drug abuse ([name])    Insomnia    Muscular dystrophies ([name])    Neuropathy    Pancreatitis    Recovering alcoholic ([name])    Past Surgical History:  Procedure Laterality Date   ESOPHAGOGASTRODUODENOSCOPY N/A 04/25/2022   Procedure: ESOPHAGOGASTRODUODENOSCOPY (EGD);  Surgeon: Therisa Bi, MD;  Location: [facility] ENDOSCOPY;  Service: Gastroenterology;  Laterality: N/A;   HERNIA REPAIR     IR FLUORO GUIDE CV LINE RIGHT  12/30/2020   IR REMOVAL TUN CV CATH W/O FL  01/19/2021   IR US  GUIDE VASC ACCESS RIGHT  12/30/2020   Patient Active Problem List   Diagnosis Date Noted   Gait abnormality 05/24/2022   Right hand weakness 05/24/2022   Frequent falls 02/12/2021   Leukocytosis    Aspiration pneumonia of both lower lobes due to gastric secretions ([name]) 01/04/2021   Pressure injury of skin  12/24/2020   Acute hepatic encephalopathy ([name]) 12/20/2020   Sepsis ([name]) 12/19/2020   Acute hyponatremia 12/18/2020   Alcohol use disorder, moderate, dependence ([name]) 12/18/2020   Hepatic steatosis 12/18/2020   Polysubstance abuse ([name]) 12/18/2020   Hyperglycemia due to type 2 diabetes mellitus ([name]) 12/18/2020   Hyperbilirubinemia 12/18/2020   Lactic acidosis 12/18/2020   Alcoholic liver disease ([name]) 12/18/2020   Bipolar 1 disorder, manic, moderate ([name]) 09/10/2018   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   Diabetes ([name]) 09/10/2018   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   Chronic gout of foot 09/29/2015   Muscular dystrophy ([name]) 09/29/2015   Type 2 diabetes mellitus without complication ([name]) 09/29/2015   Anxiety and depression 10/01/2012   Protein-calorie malnutrition, severe ([name]) 09/30/2012   Trichomonas infection 09/26/2012   Dehydration 09/26/2012   Hypokalemia 11/14/2011   Thrombocytopenia ([name]) 11/14/2011   Bipolar 2 disorder ([name]) 11/13/2011   Gout 11/13/2011     REFERRING DIAG: bilateral leg weakness  THERAPY DIAG:  Other abnormalities of gait and mobility  Muscle weakness (generalized)  History of falling  Rationale for Evaluation and Treatment Rehabilitation  PERTINENT HISTORY: Patient is a 48 y.o. female who presents to outpatient  physical therapy with a referral for medical diagnosis bilateral leg weakness. This patient's chief complaints consist of difficulty with weakness and balance with history of frequent falling leading to the following functional deficits: difficulty with basic mobility including ambulation, stairs, transfers; difficulty with ADLs, IADLs. Currently requires assistance to navigate stairs, assistance with ADLs and  IADLs, and ambulates with RW when she previously did not need an assistive device. Relevant past medical history and comorbidities include muscular dystrophy (FSHD - facioscapulohumeral muscular  dystrophy), anxiety and depression, bipolar 1 disorder, borderline personality disorder, diabetes, GERD, gout, pancreatitis, alcoholic liver disease, tobacco abuse,  foot drop, neuropathy, recovering alcoholic, history of drug abuse, history of hernia repair.  Patient denies hx of cancer, stroke, seizures, lung problems, heart problems, unexplained weight loss, unexplained changes in bowel or bladder problems, unexplained stumbling or dropping things, osteoporosis, and spinal surgery.   PRECAUTIONS: Fall, not drink alcohol.  SUBJECTIVE: Patient states she has been dizzy/light headed on and off over the last couple of days. She mentions noticing it while trying to clean her kitchen and thinks she had likely just stood up. She has been very careful to drink lots of water and avoid tea. She states it is intermittent. Her legs were a little sore after last PT session. She has an appointment with her PCP tomorrow.   PAIN: NPRS 4/10 right glute  OBJECTIVE   Vitals:   03/27/23 1356  BP: 105/70  Pulse: (!) 104  SpO2: 98%    1 RM (calculated):  Double leg press at seat position 1: 120.3# (last measured 03/06/2023)     TODAY'S TREATMENT:  Therapeutic exercise: to centralize symptoms and improve ROM, strength, muscular endurance, and activity tolerance required for successful completion of functional activities.   Vitals due to report of feeling lightheaded (see above)  seated leg press, seat position 1:  Double leg stance: 1x20 at 35#  1 min rest Double leg stance: 1x15 at 85# (~70% 1RM) 1 min rest Double leg stance: 1x15 at 85# (~70% 1RM)   1 min rest Double leg stance: 1x15 at 85# (~70% 1RM)  Neuromuscular Re-education: to improve, balance, postural strength, muscle activation patterns, and stabilization strength required for functional activities:  Unsupported sitting with hands across chest (edge of chair, accidentally leans back at times):  Blazepods taps with 6 pods arranged 3  around each foot, tapping pods at random as fast as possible, 2 rounds: 41 hits first round and timed out on ipad second round.   Therapeutic activities: dynamic activities for functional strengthening and improved functional activity tolerance. Sled push, 3 times 2x30 feet with seated rest between sets and PT assistance with turning sled. Used tall upright poles.   Pt required multimodal cuing for proper technique and to facilitate improved neuromuscular control, strength, range of motion, and functional ability resulting in improved performance and form.   PATIENT EDUCATION:  Education details: Exercise purpose/form. Self management techniques. Education on diagnosis, prognosis, POC, anatomy and physiology of current condition.  Person educated: Patient Education method: Explanation, demonstration, verbal cuing, tactile cuing. Education comprehension: verbalized understanding, demonstrated understanding, and needs further education      HOME EXERCISE PROGRAM: Access Code: [email or web address] URL: [email or web address] Date: 08/20/2022 Prepared by: Camie Cleverly  Exercises - Sit to Stand Without Arm Support  - 3 x weekly - 3 sets - 5 reps - Seated Toe Raise  - 3 x weekly - 3 sets - 6 reps - 1 second hold - Supine Lower Trunk Rotation  - 3  x weekly - 1 sets - 20 reps - 1-5 seconds hold - Prone Press Up  - 3 x weekly - 2 sets - 5 reps    ASSESSMENT:   CLINICAL IMPRESSION:  Patient arrives reporting some dizziness but with strong effort to maintain hydration. Vitals revealed her blood pressure is somewhat low and heart rate elevated above 100 bpm. Monitored for symptoms during session, and patient was able to complete session without complaints of concordant dizziness. Patient continued with B LE and functional strengthening. Exercise for improved trunk/core stability was added with patient significantly challenged. Suggest repeating this exercise seated on plinth instead of chair,  where she cannot lean against the chair arms or back inadvertently. Patient did well overcoming psychosocial factors this week and was able to attend PT twice. Patient would benefit from continued management of limiting condition by skilled physical therapist to address remaining impairments and functional limitations to work towards stated goals and return to PLOF or maximal functional independence.   OBJECTIVE IMPAIRMENTS Abnormal gait, decreased activity tolerance, decreased balance, decreased coordination, decreased endurance, decreased mobility, difficulty walking, decreased ROM, decreased strength, impaired perceived functional ability, increased muscle spasms, impaired tone, impaired UE functional use, improper body mechanics, postural dysfunction, and pain.    ACTIVITY LIMITATIONS cleaning, community activity, driving, meal prep, laundry, and shopping.    PERSONAL FACTORS Behavior pattern, Fitness, Past/current experiences, Time since onset of injury/illness/exacerbation, Transportation, and 3+ comorbidities:    muscular dystrophy (FSHD - facioscapulohumeral muscular dystrophy), anxiety and depression, bipolar 1 disorder, borderline personality disorder, diabetes, GERD, gout, pancreatitis, alcoholic liver disease, tobacco abuse,  foot drop, neuropathy, recovering alcoholic, history of drug abuse, history of hernia repair are also affecting patient's functional outcome.      REHAB POTENTIAL: Good   CLINICAL DECISION MAKING: Evolving/moderate complexity   EVALUATION COMPLEXITY: Moderate     GOALS: Goals reviewed with patient? No   SHORT TERM GOALS: Target date: 07/24/2021   Patient will be independent with initial home exercise program for self-management of symptoms. Baseline: Initial HEP to be provided at visit 2 as appropriate (07/10/21); Goal status: Met     LONG TERM GOALS: Target date: 10/02/2021; updated to 12/27/2021 for all unmet goals on 10/04/2021; updated to 03/26/2022 for all  unmet goals on 01/01/2022; updated to 06/19/2022 for all unmet goals on 03/27/2022; updated to 08/14/2022 for all unmet goals on 05/22/2022, updated to 11/12/2022 for all unmet goals on 08/20/2022, updated to 01/09/2023 for all unmet goals on 10/17/2022, updated to 05/29/2023 for all unmet goals on 03/06/2023.    Patient will be independent with a long-term home exercise program for self-management of symptoms.  Baseline: Initial HEP to be provided at visit 2 as appropriate (07/10/21); participating as tolerated, recently too much pain to participate (09/04/2021); participating regularly (10/04/2021); participating as able (11/21/2021); not participating regularly over last couple of weeks (01/01/2022); no participating regularly since last progress note (02/15/2022); improving participation (03/27/2022); has not last two weeks due to recent fall (05/17/22); has not been participating (08/20/2022); patient is participating in HEP 2x a week (10/17/2022); did not participate since last PT visit (01/22/2023); sporatic participation (03/06/2023);  Goal status:  In-progress   2.  Patient will demonstrate improved FOTO score by 10 points from baseline to demonstrate improvement in overall condition and self-reported functional ability.  Baseline: to be measured visit 2 as appropriate (07/10/21); 46 at visit # 2 (07/17/2021); 50 at visit # 10 (09/04/2021); 53 at visit #15 (10/04/2021); 50 at visit # 20 (11/21/2021);  52 at visit # 26 (01/01/2022); 50 at visit #30 (02/15/2022); 54 at visit # 34 (03/27/22);  53 (05/17/22); 51 at visit #44 (08/20/2022); 58 at visit #50 (10/17/2022); 51 at visit #60 (03/07/2023); Goal status: ongoing   3.  Patient will ambulate equal or greater than 1000 feet during 6 Min Walk Test using [facility] or less restrictive assistive device to improve community ambulation.  Baseline: To be tested visit 2 as appropriate (07/10/21); 310 feet with RW (07/17/2021); 556 feet with rollator (09/04/2021); 600 feet with rollator (10/04/2021);  300 feet with rollator - had to stop test at 3 min due to intolerable right glute pain (11/21/2021); 420 feet with rollator and steppage gait, R > L. Stopped at 5 min due to feeling of instability in low back to mid thoracic spine (01/01/2022); 573 feet with rollator and steppage gait, R > L. Occasional standing breaks. Low back pain increased to 3.5/10 (02/15/2022);  381 feet with rollator and steppage gait, R > L. Stopped at 4:30 min due to pain in low back and right glute (03/27/2022); 124' in 1 minute 22 sec. Had to discontinue due to R back/hip pain (05/17/2022); 200 feet with rollator, discontinued due to low back/glute pain (08/20/2022); 500 feet with rollator (10/17/2022); 204 feet with rollator and steppage gait, R > L. R foot in orthopedic shoe (03/06/2023);  Goal status: ongoing   4.  Patient will complete 5 Time Sit To Stand test in equal or less than 20 seconds with no UE support from 18.5 inch plinth to demonstrate improved LE functional strength and power for basic mobility and improved fall risk.  Baseline: 24 seconds with B UE on 18.5 inch plinth.  (07/10/21); 20 seconds with B UE on thighs from 18.5 inch plinth (09/04/2021); 19 seconds with B UE on thighs from 18.5 inch plinth (10/04/2021); 22 seconds with B UE on thighs from 18.5 inch plinth (11/21/2021); 20 seconds with B UE on thighs from 18.5 inch plinth (01/01/2022); 19 seconds without use of B UE support (02/15/2022); 21 seconds with no UE support from 18.5 inch plinth (03/27/2022); 20.5 seconds no UE support and 18 surface (05/17/2022); 20 seconds with no UE support from 18.5 inch plinth (08/20/2022); 18 seconds with no UE support from 18.5 inch plinth. Did lean back of legs on plinth for stability for 1-2 reps (03/06/2023);  Goal status: in progress     5.  Patient be able to ascend and descend stairs at her home mod I with use of B handrails.  Baseline: able to ascend/descend 4 steps in the clinic with B UE support on handrails and supervision -  reports currently needs assistance to descend steps at home. (07/10/21); able to ascend/descend 4 steps in the clinic with B UE support on handrails and supervision, leading up and down with left. able to perform with reversed LE leads (09/04/2021); able to ascend/descend 4 steps in the clinic with B UE support on handrails and supervision, step over step with heavy UE use on the way down (10/04/2021); able to ascend/descend 4 steps in the clinic with B UE support on handrails and supervision, step over step up and step to leading with R LE with heavy UE use on the way down (11/21/2021); ascend/descends 4 steps in the clinic with B UE support on handrails and supervision, step over step up and down with heavy UE use on the way down (01/01/2022); able to ascend/descend 4 steps in the clinic with B UE support on handrails and supervision,  step over step up and down with heavy UE use on the way down. leads up with right foot step to gait (02/15/2022; 03/27/22); able to ascend/descend 4 steps in the clinic with B UE support on handrails and supervision, step over step up and down with moderately heavy UE use on the way down (08/20/2022); able to ascend/descend 4 steps in the clinic with B UE support on handrails and supervision, step over step up and down with UE use on the way down (10/17/2022; 03/06/23);   Goal status: MET   6.  Patient will demonstrate ability to perform static balance in tandem stance for equal or greater than 30 seconds on each side, to demonstrate improved balance and decreased fall risk.  Baseline: right foot front 10 seconds, left foot front 7 seconds (07/10/2021); right foot front 4 seconds, left foot front 6 seconds (09/04/2021); right foot front 9 seconds, left foot front 31 seconds (10/04/2021); right foot front 6.5 seconds, left foot front 8.5 seconds (11/21/2021); right foot front 8 seconds, left foot front 7 seconds (01/01/2022);  right foot front 9 seconds, left foot front 20 seconds (02/15/2022);  right foot front 30 seconds, left foot front 45 seconds (03/27/2022); right foot front 16 seconds, left foot front 17 seconds (08/20/2022); right foot front 26 seconds, left foot front 43 seconds (10/17/2022); Right foot front 35 seconds, left foot front 7 seconds (03/06/2023);  Goal status: MET 03/27/22, regressed 03/16/23  7.  Patient will demonstrate improvement in Patient Specific Functional Scale (PSFS) of equal or greater than 3 points to reflect clinically significant improvement in patient's most valued functional activities. Baseline: 4 (03/06/2023); Goal status: NEW 03/06/2023  8.  Patient will demonstrate equal or greater than 10% improvement (132#) in calculated 1RM for double leg press to improve her LE strength for functional activities such as stairs and transfers.  Baseline: 120.3# (03/06/2023);  Goal status: NEW (03/06/2023)     PLAN: PT FREQUENCY: 2x/week   PT DURATION: 12 weeks   PLANNED INTERVENTIONS: Therapeutic exercises, Therapeutic activity, Neuromuscular re-education, Balance training, Gait training, Patient/Family education, Joint mobilization, Orthotic/Fit training, DME instructions, Dry Needling, Electrical stimulation, Cryotherapy, Moist heat, Splintting, Taping, and Manual therapy   PLAN FOR NEXT SESSION: progressive LE/functional strength/balance/muscular endurance as tolerated. Manual therapy for pain control as needed.    Camie SAUNDERS. Juli, PT, DPT 03/28/23, 7:05 PM  [facility] [address] [place], [place] [address] P: [phone] I F: [phone]

## 2023-04-01 ENCOUNTER — Ambulatory Visit: Payer: 59 | Admitting: Podiatry

## 2023-04-01 ENCOUNTER — Ambulatory Visit: Payer: 59 | Admitting: Physical Therapy

## 2023-04-02 ENCOUNTER — Ambulatory Visit: Payer: 59 | Admitting: Podiatry

## 2023-04-03 ENCOUNTER — Ambulatory Visit: Payer: 59 | Admitting: Physical Therapy

## 2023-04-08 ENCOUNTER — Ambulatory Visit: Payer: 59 | Admitting: Physical Therapy

## 2023-04-09 ENCOUNTER — Ambulatory Visit: Payer: 59 | Admitting: Physical Therapy

## 2023-04-11 ENCOUNTER — Ambulatory Visit: Payer: 59 | Admitting: Physical Therapy

## 2023-04-11 ENCOUNTER — Other Ambulatory Visit: Payer: Self-pay

## 2023-04-11 ENCOUNTER — Encounter (HOSPITAL_COMMUNITY): Payer: Self-pay | Admitting: Oral Surgery

## 2023-04-11 NOTE — Progress Notes (Signed)
SDW CALL  Patient was given pre-op instructions over the phone. The opportunity was given for the patient to ask questions. No further questions asked. Patient verbalized understanding of instructions given.   PCP - Eunice Blase, PA-C Cardiologist - Denies  PPM/ICD - Denies  Chest x-ray - Denies EKG - 01/22/2023 Stress Test - Denies ECHO - 12/29/2020 Cardiac Cath - Denies  Sleep Study - Denies  DM: type II   Blood Thinner Instructions: N/A Aspirin Instructions:N/A  ERAS Protcol - Yes PRE-SURGERY Ensure or G2- N/A  COVID TEST- N/A   Anesthesia review: Yes, cardiac history  Patient denies shortness of breath, fever, cough and chest pain over the phone call   All instructions explained to the patient, with a verbal understanding of the material. Patient agrees to go over the instructions while at home for a better understanding.

## 2023-04-15 ENCOUNTER — Ambulatory Visit: Payer: 59 | Admitting: Physical Therapy

## 2023-04-15 NOTE — H&P (Signed)
       Patient: Elaura Calix  PID: [id number]  DOB: June 11, 1975  SEX: Female   Patient referred by DDS for extraction teeth #1, 3, 13, 15, 16, 17, 18, 19, 29, 30, 31, 32.  CC:   Past Medical History:  Diabetes, Muscular dystrophy, Alcoholism, in remission, GERD, Bipolar disease, Esophageal Varices, Pancreatitis, Neuropathy, Gout, Borderline Personality disorder, Asthma, Anxiety, Obese    Medications: Insulin, Metformin, Lyrica, Vraylar, Prozac, Mirtazipine, Vistaril, Atorvastatin, Baclofen    Allergies:     Erythromycin, Sulfa, Tylenol    Surgeries:   Hernia, abortion         Social History       Smoking:            Alcohol: Drug use:                             Exam: BMI 36.  Caries teeth #'s 1, 3, 13, 15, 16, 17, 18, 19, 29, 30, 31, 32. No purulence, edema, fluctuance, trismus. Oral cancer screening negative. Pharynx clear. No lymphadenopathy.  Panorex:Caries teeth #'s 1, 3, 13, 15, 16, 17, 18, 19, 29, 30, 31, 32.   Assessment: ASA 3.  Non-restorable teeth# 1, 3, 13, 15, 16, 17, 18, 19, 29, 30, 31, 32.              Plan: 1.  MD Clearance obtained 2. Extraction Teeth # 1, 3, 13, 15, 16, 17, 18, 19, 29, 30, 31, 32.   Hospital day surgery.               Rx: none               Risks and complications explained. Questions answered.   Georgia Lopes, DMD

## 2023-04-16 ENCOUNTER — Ambulatory Visit (HOSPITAL_COMMUNITY): Admission: RE | Admit: 2023-04-16 | Payer: 59 | Source: Home / Self Care | Admitting: Oral Surgery

## 2023-04-16 HISTORY — DX: Pneumonia, unspecified organism: J18.9

## 2023-04-16 SURGERY — DENTAL RESTORATION/EXTRACTIONS
Anesthesia: General

## 2023-04-17 ENCOUNTER — Ambulatory Visit: Payer: 59 | Admitting: Physical Therapy

## 2023-04-23 ENCOUNTER — Encounter: Payer: Self-pay | Admitting: Physical Therapy

## 2023-04-23 ENCOUNTER — Ambulatory Visit: Payer: 59 | Attending: Internal Medicine | Admitting: Physical Therapy

## 2023-04-23 DIAGNOSIS — M6281 Muscle weakness (generalized): Secondary | ICD-10-CM | POA: Insufficient documentation

## 2023-04-23 DIAGNOSIS — R2689 Other abnormalities of gait and mobility: Secondary | ICD-10-CM | POA: Diagnosis not present

## 2023-04-23 DIAGNOSIS — Z9181 History of falling: Secondary | ICD-10-CM | POA: Insufficient documentation

## 2023-04-23 NOTE — Therapy (Signed)
 OUTPATIENT PHYSICAL THERAPY TREATMENT   Patient Name: [name] MRN: [id number] DOB:May 08, 1975, 48 y.o., female Today's Date: 04/23/2023  PCP: Glenis Bridge, PA-C REFERRING PROVIDER: Glenis Bridge, PA-C  END OF SESSION:   PT End of Session - 04/23/23 1522     Visit Number 64    Number of Visits 74    Date for PT Re-Evaluation 05/29/23    Authorization Type [employer] MEDICARE/Medicaid reporting period from 10/17/2022    Progress Note Due on Visit 70    PT Start Time 1515    PT Stop Time 1557    PT Time Calculation (min) 42 min    Activity Tolerance Patient tolerated treatment well;Patient limited by fatigue    Behavior During Therapy WFL for tasks assessed/performed              Past Medical History:  Diagnosis Date   Alcohol abuse    Anxiety    Anxiety    Anxiety and depression    Asthma    Bipolar disorder ([name])    Borderline personality disorder ([name])    Depression    Diabetes ([name])    Type 2    Dyslipidemia    Esophageal varices ([name])    Foot drop    right   [name] ([name])    GERD (gastroesophageal reflux disease)    Gout    History of drug abuse ([name])    Insomnia    Muscular dystrophies ([name])    Neuropathy    Pancreatitis    Pneumonia    Recovering alcoholic ([name])    Past Surgical History:  Procedure Laterality Date   ESOPHAGOGASTRODUODENOSCOPY N/A 04/25/2022   Procedure: ESOPHAGOGASTRODUODENOSCOPY (EGD);  Surgeon: Therisa Bi, MD;  Location: [facility] ENDOSCOPY;  Service: Gastroenterology;  Laterality: N/A;   HERNIA REPAIR     IR FLUORO GUIDE CV LINE RIGHT  12/30/2020   IR REMOVAL TUN CV CATH W/O FL  01/19/2021   IR US  GUIDE VASC ACCESS RIGHT  12/30/2020   Patient Active Problem List   Diagnosis Date Noted   Gait abnormality 05/24/2022   Right hand weakness 05/24/2022   Frequent falls 02/12/2021   Leukocytosis    Aspiration pneumonia of both lower lobes due to gastric secretions ([name]) 01/04/2021   Pressure injury of  skin 12/24/2020   Acute hepatic encephalopathy ([name]) 12/20/2020   Sepsis ([name]) 12/19/2020   Acute hyponatremia 12/18/2020   Alcohol use disorder, moderate, dependence ([name]) 12/18/2020   Hepatic steatosis 12/18/2020   Polysubstance abuse ([name]) 12/18/2020   Hyperglycemia due to type 2 diabetes mellitus ([name]) 12/18/2020   Hyperbilirubinemia 12/18/2020   Lactic acidosis 12/18/2020   Alcoholic liver disease ([name]) 12/18/2020   Bipolar 1 disorder, manic, moderate ([name]) 09/10/2018   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   Diabetes ([name]) 09/10/2018   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   Chronic gout of foot 09/29/2015   Muscular dystrophy ([name]) 09/29/2015   Type 2 diabetes mellitus without complication ([name]) 09/29/2015   Anxiety and depression 10/01/2012   Protein-calorie malnutrition, severe ([name]) 09/30/2012   Trichomonas infection 09/26/2012   Dehydration 09/26/2012   Hypokalemia 11/14/2011   Thrombocytopenia ([name]) 11/14/2011   Bipolar 2 disorder ([name]) 11/13/2011   Gout 11/13/2011     REFERRING DIAG: bilateral leg weakness  THERAPY DIAG:  Other abnormalities of gait and mobility  Muscle weakness (generalized)  History of falling  Rationale for Evaluation and Treatment Rehabilitation  PERTINENT HISTORY: Patient is a 48 y.o. female who presents to outpatient  physical therapy with a referral for medical diagnosis bilateral leg weakness. This patient's chief complaints consist of difficulty with weakness and balance with history of frequent falling leading to the following functional deficits: difficulty with basic mobility including ambulation, stairs, transfers; difficulty with ADLs, IADLs. Currently requires assistance to navigate stairs, assistance with ADLs and  IADLs, and ambulates with RW when she previously did not need an assistive device. Relevant past medical history and comorbidities include muscular dystrophy (FSHD - facioscapulohumeral muscular  dystrophy), anxiety and depression, bipolar 1 disorder, borderline personality disorder, diabetes, GERD, gout, pancreatitis, alcoholic liver disease, tobacco abuse,  foot drop, neuropathy, recovering alcoholic, history of drug abuse, history of hernia repair.  Patient denies hx of cancer, stroke, seizures, lung problems, heart problems, unexplained weight loss, unexplained changes in bowel or bladder problems, unexplained stumbling or dropping things, osteoporosis, and spinal surgery.   PRECAUTIONS: Fall, not drink alcohol.  SUBJECTIVE: Patient states her right hip is hurting in a new place hurting. She has not been to PT for a while due to her car being broken down. She utilized a ride service provided through her insurance to get here today. She states she has not had any falls and was unable to get to her PCP visit so it has been rescheduled to 05/02/2023. She has not had further difficulty with lightheadedness lately. She has started counting calories again.   PAIN: NPRS 3/10 right lateral hip/pelvis  OBJECTIVE   1 RM (calculated):  Double leg press at seat position 1: 120.3# (last measured 03/06/2023)     TODAY'S TREATMENT:  Therapeutic activities: dynamic therapeutic activities incorporating MULTIPLE parameters or areas of the body designed to achieve improved functional performance. seated leg press, seat position 1:  Double leg stance: 1x20 at 35#  1 min rest Double leg stance: 1x15 at 85# (~70% 1RM) 1 min rest Double leg stance: 1x15 at 85# (~70% 1RM)   1 min rest Double leg stance: 1x15 at 85# (~70% 1RM) 1 min rest  Sled push (empty sled = 75#), 4 times 2x30 feet with 1 min seated rest between sets and PT assistance with turning sled. Used tall upright poles.   Unsupported sitting (edge of plinth, consider standing next session) Blazepods taps with 6 pods arranged 3 around each foot, tapping pods at random as fast as possible, 1 rounds: 85 hits first round, 95 2nd round (arms  behind back 2nd stet)  Pt required multimodal cuing for proper technique and to facilitate improved neuromuscular control, strength, range of motion, and functional ability resulting in improved performance and form.   PATIENT EDUCATION:  Education details: Exercise purpose/form. Self management techniques. Education on diagnosis, prognosis, POC, anatomy and physiology of current condition.  Person educated: Patient Education method: Explanation, demonstration, verbal cuing, tactile cuing. Education comprehension: verbalized understanding, demonstrated understanding, and needs further education      HOME EXERCISE PROGRAM: Access Code: [email or web address] URL: [email or web address] Date: 08/20/2022 Prepared by: Camie Cleverly  Exercises - Sit to Stand Without Arm Support  - 3 x weekly - 3 sets - 5 reps - Seated Toe Raise  - 3 x weekly - 3 sets - 6 reps - 1 second hold - Supine Lower Trunk Rotation  - 3 x weekly - 1 sets - 20 reps - 1-5 seconds hold - Prone Press Up  - 3 x weekly - 2 sets - 5 reps    ASSESSMENT:   CLINICAL IMPRESSION:  Patient arrives with rollator reporting no new falls and continued lack of  participation in HEP. Today's session continued with LE/functional strengthening and motor control to improve functional activity tolerance and ability to mobilize safely. Patient was able to complete all exercises with sufficient rest between sets. She was appropriately fatigued by the end of the session but without increased pain. Patient would benefit from continued management of limiting condition by skilled physical therapist to address remaining impairments and functional limitations to work towards stated goals and return to PLOF or maximal functional independence.   OBJECTIVE IMPAIRMENTS Abnormal gait, decreased activity tolerance, decreased balance, decreased coordination, decreased endurance, decreased mobility, difficulty walking, decreased ROM, decreased strength,  impaired perceived functional ability, increased muscle spasms, impaired tone, impaired UE functional use, improper body mechanics, postural dysfunction, and pain.    ACTIVITY LIMITATIONS cleaning, community activity, driving, meal prep, laundry, and shopping.    PERSONAL FACTORS Behavior pattern, Fitness, Past/current experiences, Time since onset of injury/illness/exacerbation, Transportation, and 3+ comorbidities:    muscular dystrophy (FSHD - facioscapulohumeral muscular dystrophy), anxiety and depression, bipolar 1 disorder, borderline personality disorder, diabetes, GERD, gout, pancreatitis, alcoholic liver disease, tobacco abuse,  foot drop, neuropathy, recovering alcoholic, history of drug abuse, history of hernia repair are also affecting patient's functional outcome.      REHAB POTENTIAL: Good   CLINICAL DECISION MAKING: Evolving/moderate complexity   EVALUATION COMPLEXITY: Moderate     GOALS: Goals reviewed with patient? No   SHORT TERM GOALS: Target date: 07/24/2021   Patient will be independent with initial home exercise program for self-management of symptoms. Baseline: Initial HEP to be provided at visit 2 as appropriate (07/10/21); Goal status: Met     LONG TERM GOALS: Target date: 10/02/2021; updated to 12/27/2021 for all unmet goals on 10/04/2021; updated to 03/26/2022 for all unmet goals on 01/01/2022; updated to 06/19/2022 for all unmet goals on 03/27/2022; updated to 08/14/2022 for all unmet goals on 05/22/2022, updated to 11/12/2022 for all unmet goals on 08/20/2022, updated to 01/09/2023 for all unmet goals on 10/17/2022, updated to 05/29/2023 for all unmet goals on 03/06/2023.    Patient will be independent with a long-term home exercise program for self-management of symptoms.  Baseline: Initial HEP to be provided at visit 2 as appropriate (07/10/21); participating as tolerated, recently too much pain to participate (09/04/2021); participating regularly (10/04/2021); participating as  able (11/21/2021); not participating regularly over last couple of weeks (01/01/2022); no participating regularly since last progress note (02/15/2022); improving participation (03/27/2022); has not last two weeks due to recent fall (05/17/22); has not been participating (08/20/2022); patient is participating in HEP 2x a week (10/17/2022); did not participate since last PT visit (01/22/2023); sporatic participation (03/06/2023);  Goal status:  In-progress   2.  Patient will demonstrate improved FOTO score by 10 points from baseline to demonstrate improvement in overall condition and self-reported functional ability.  Baseline: to be measured visit 2 as appropriate (07/10/21); 46 at visit # 2 (07/17/2021); 50 at visit # 10 (09/04/2021); 53 at visit #15 (10/04/2021); 50 at visit # 20 (11/21/2021); 52 at visit # 26 (01/01/2022); 50 at visit #30 (02/15/2022); 54 at visit # 34 (03/27/22);  53 (05/17/22); 51 at visit #44 (08/20/2022); 58 at visit #50 (10/17/2022); 51 at visit #60 (03/07/2023); Goal status: ongoing   3.  Patient will ambulate equal or greater than 1000 feet during 6 Min Walk Test using [facility] or less restrictive assistive device to improve community ambulation.  Baseline: To be tested visit 2 as appropriate (07/10/21); 310 feet with RW (07/17/2021); 556 feet with rollator (09/04/2021);  600 feet with rollator (10/04/2021); 300 feet with rollator - had to stop test at 3 min due to intolerable right glute pain (11/21/2021); 420 feet with rollator and steppage gait, R > L. Stopped at 5 min due to feeling of instability in low back to mid thoracic spine (01/01/2022); 573 feet with rollator and steppage gait, R > L. Occasional standing breaks. Low back pain increased to 3.5/10 (02/15/2022);  381 feet with rollator and steppage gait, R > L. Stopped at 4:30 min due to pain in low back and right glute (03/27/2022); 124' in 1 minute 22 sec. Had to discontinue due to R back/hip pain (05/17/2022); 200 feet with rollator, discontinued due to  low back/glute pain (08/20/2022); 500 feet with rollator (10/17/2022); 204 feet with rollator and steppage gait, R > L. R foot in orthopedic shoe (03/06/2023);  Goal status: ongoing   4.  Patient will complete 5 Time Sit To Stand test in equal or less than 20 seconds with no UE support from 18.5 inch plinth to demonstrate improved LE functional strength and power for basic mobility and improved fall risk.  Baseline: 24 seconds with B UE on 18.5 inch plinth.  (07/10/21); 20 seconds with B UE on thighs from 18.5 inch plinth (09/04/2021); 19 seconds with B UE on thighs from 18.5 inch plinth (10/04/2021); 22 seconds with B UE on thighs from 18.5 inch plinth (11/21/2021); 20 seconds with B UE on thighs from 18.5 inch plinth (01/01/2022); 19 seconds without use of B UE support (02/15/2022); 21 seconds with no UE support from 18.5 inch plinth (03/27/2022); 20.5 seconds no UE support and 18 surface (05/17/2022); 20 seconds with no UE support from 18.5 inch plinth (08/20/2022); 18 seconds with no UE support from 18.5 inch plinth. Did lean back of legs on plinth for stability for 1-2 reps (03/06/2023);  Goal status: in progress     5.  Patient be able to ascend and descend stairs at her home mod I with use of B handrails.  Baseline: able to ascend/descend 4 steps in the clinic with B UE support on handrails and supervision - reports currently needs assistance to descend steps at home. (07/10/21); able to ascend/descend 4 steps in the clinic with B UE support on handrails and supervision, leading up and down with left. able to perform with reversed LE leads (09/04/2021); able to ascend/descend 4 steps in the clinic with B UE support on handrails and supervision, step over step with heavy UE use on the way down (10/04/2021); able to ascend/descend 4 steps in the clinic with B UE support on handrails and supervision, step over step up and step to leading with R LE with heavy UE use on the way down (11/21/2021); ascend/descends 4 steps  in the clinic with B UE support on handrails and supervision, step over step up and down with heavy UE use on the way down (01/01/2022); able to ascend/descend 4 steps in the clinic with B UE support on handrails and supervision, step over step up and down with heavy UE use on the way down. leads up with right foot step to gait (02/15/2022; 03/27/22); able to ascend/descend 4 steps in the clinic with B UE support on handrails and supervision, step over step up and down with moderately heavy UE use on the way down (08/20/2022); able to ascend/descend 4 steps in the clinic with B UE support on handrails and supervision, step over step up and down with UE use on the way down (10/17/2022; 03/06/23);  Goal status: MET   6.  Patient will demonstrate ability to perform static balance in tandem stance for equal or greater than 30 seconds on each side, to demonstrate improved balance and decreased fall risk.  Baseline: right foot front 10 seconds, left foot front 7 seconds (07/10/2021); right foot front 4 seconds, left foot front 6 seconds (09/04/2021); right foot front 9 seconds, left foot front 31 seconds (10/04/2021); right foot front 6.5 seconds, left foot front 8.5 seconds (11/21/2021); right foot front 8 seconds, left foot front 7 seconds (01/01/2022);  right foot front 9 seconds, left foot front 20 seconds (02/15/2022); right foot front 30 seconds, left foot front 45 seconds (03/27/2022); right foot front 16 seconds, left foot front 17 seconds (08/20/2022); right foot front 26 seconds, left foot front 43 seconds (10/17/2022); Right foot front 35 seconds, left foot front 7 seconds (03/06/2023);  Goal status: MET 03/27/22, regressed 03/16/23  7.  Patient will demonstrate improvement in Patient Specific Functional Scale (PSFS) of equal or greater than 3 points to reflect clinically significant improvement in patient's most valued functional activities. Baseline: 4 (03/06/2023); Goal status: NEW 03/06/2023  8.  Patient will  demonstrate equal or greater than 10% improvement (132#) in calculated 1RM for double leg press to improve her LE strength for functional activities such as stairs and transfers.  Baseline: 120.3# (03/06/2023);  Goal status: NEW (03/06/2023)     PLAN: PT FREQUENCY: 2x/week   PT DURATION: 12 weeks   PLANNED INTERVENTIONS: Therapeutic exercises, Therapeutic activity, Neuromuscular re-education, Balance training, Gait training, Patient/Family education, Joint mobilization, Orthotic/Fit training, DME instructions, Dry Needling, Electrical stimulation, Cryotherapy, Moist heat, Splintting, Taping, and Manual therapy   PLAN FOR NEXT SESSION: progressive LE/functional strength/balance/muscular endurance as tolerated. Manual therapy for pain control as needed.    Camie SAUNDERS. Juli, PT, DPT 04/23/23, 4:29 PM  [facility] [address] [place], [place] [address] P: [phone] I F: [phone]

## 2023-04-30 ENCOUNTER — Encounter: Payer: Self-pay | Admitting: Physical Therapy

## 2023-04-30 ENCOUNTER — Ambulatory Visit: Payer: 59 | Admitting: Physical Therapy

## 2023-04-30 DIAGNOSIS — Z9181 History of falling: Secondary | ICD-10-CM | POA: Diagnosis not present

## 2023-04-30 DIAGNOSIS — R2689 Other abnormalities of gait and mobility: Secondary | ICD-10-CM

## 2023-04-30 DIAGNOSIS — M6281 Muscle weakness (generalized): Secondary | ICD-10-CM | POA: Diagnosis not present

## 2023-04-30 NOTE — Therapy (Unsigned)
OUTPATIENT PHYSICAL THERAPY TREATMENT   Patient Name: [name] MRN: [id number] DOB:Nov 04, 1975, 48 y.o., female Today's Date: 04/30/2023  PCP: Eunice Blase, PA-C REFERRING PROVIDER: Eunice Blase, PA-C  END OF SESSION:   PT End of Session - 04/30/23 1850     Visit Number 65    Number of Visits 74    Date for PT Re-Evaluation 05/29/23    Authorization Type [employer] MEDICARE/Medicaid reporting period from 10/17/2022    Progress Note Due on Visit 70    PT Start Time 1301    PT Stop Time 1345    PT Time Calculation (min) 44 min    Activity Tolerance Patient tolerated treatment well;Patient limited by fatigue    Behavior During Therapy WFL for tasks assessed/performed               Past Medical History:  Diagnosis Date   Alcohol abuse    Anxiety    Anxiety    Anxiety and depression    Asthma    Bipolar disorder ([name])    Borderline personality disorder ([name])    Depression    Diabetes ([name])    Type 2    Dyslipidemia    Esophageal varices ([name])    Foot drop    right   [name] ([name])    GERD (gastroesophageal reflux disease)    Gout    History of drug abuse ([name])    Insomnia    Muscular dystrophies ([name])    Neuropathy    Pancreatitis    Pneumonia    Recovering alcoholic ([name])    Past Surgical History:  Procedure Laterality Date   ESOPHAGOGASTRODUODENOSCOPY N/A 04/25/2022   Procedure: ESOPHAGOGASTRODUODENOSCOPY (EGD);  Surgeon: [name], MD;  Location: [facility] ENDOSCOPY;  Service: Gastroenterology;  Laterality: N/A;   HERNIA REPAIR     IR FLUORO GUIDE CV LINE RIGHT  12/30/2020   IR REMOVAL TUN CV CATH W/O FL  01/19/2021   IR US GUIDE VASC ACCESS RIGHT  12/30/2020   Patient Active Problem List   Diagnosis Date Noted   Gait abnormality 05/24/2022   Right hand weakness 05/24/2022   Frequent falls 02/12/2021   Leukocytosis    Aspiration pneumonia of both lower lobes due to gastric secretions ([name]) 01/04/2021   Pressure injury  of skin 12/24/2020   Acute hepatic encephalopathy ([name]) 12/20/2020   Sepsis ([name]) 12/19/2020   Acute hyponatremia 12/18/2020   Alcohol use disorder, moderate, dependence ([name]) 12/18/2020   Hepatic steatosis 12/18/2020   Polysubstance abuse ([name]) 12/18/2020   Hyperglycemia due to type 2 diabetes mellitus ([name]) 12/18/2020   Hyperbilirubinemia 12/18/2020   Lactic acidosis 12/18/2020   Alcoholic liver disease ([name]) 12/18/2020   Bipolar 1 disorder, manic, moderate ([name]) 09/10/2018   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   Diabetes ([name]) 09/10/2018   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   Chronic gout of foot 09/29/2015   Muscular dystrophy ([name]) 09/29/2015   Type 2 diabetes mellitus without complication ([name]) 09/29/2015   Anxiety and depression 10/01/2012   Protein-calorie malnutrition, severe ([name]) 09/30/2012   Trichomonas infection 09/26/2012   Dehydration 09/26/2012   Hypokalemia 11/14/2011   Thrombocytopenia ([name]) 11/14/2011   Bipolar 2 disorder ([name]) 11/13/2011   Gout 11/13/2011     REFERRING DIAG: bilateral leg weakness  THERAPY DIAG:  Other abnormalities of gait and mobility  Muscle weakness (generalized)  History of falling  Rationale for Evaluation and Treatment Rehabilitation  PERTINENT HISTORY: Patient is a 48 y.o. female who presents to  outpatient physical therapy with a referral for medical diagnosis bilateral leg weakness. This patient's chief complaints consist of difficulty with weakness and balance with history of frequent falling leading to the following functional deficits: difficulty with basic mobility including ambulation, stairs, transfers; difficulty with ADLs, IADLs. Currently requires assistance to navigate stairs, assistance with ADLs and  IADLs, and ambulates with RW when she previously did not need an assistive device. Relevant past medical history and comorbidities include muscular dystrophy (FSHD - facioscapulohumeral muscular  dystrophy), anxiety and depression, bipolar 1 disorder, borderline personality disorder, diabetes, GERD, gout, pancreatitis, alcoholic liver disease, tobacco abuse,  foot drop, neuropathy, recovering alcoholic, history of drug abuse, history of hernia repair.  Patient denies hx of cancer, stroke, seizures, lung problems, heart problems, unexplained weight loss, unexplained changes in bowel or bladder problems, unexplained stumbling or dropping things, osteoporosis, and spinal surgery.   PRECAUTIONS: Fall, not drink alcohol.  SUBJECTIVE:  [name] was yesterday - had a good, quiet day  Right hip/glute is hurting today  Legs were very tired for the rest of the day after last session  Has not done her exercises since last session  Has been drinking more water and less tea and thinks that has been helping with feeling less lightheaded lately   PAIN: NPRS 4/10 right lateral hip/pelvis  OBJECTIVE   1 RM (calculated):  Double leg press at seat position 1: 120.3# (last measured 03/06/2023)     TODAY'S TREATMENT:  Therapeutic activities: dynamic therapeutic activities incorporating MULTIPLE parameters or areas of the body designed to achieve improved functional performance. Seated leg press, seat position 1:  Double leg stance: 1x20 at 35#  1 min rest Double leg stance: 1x15 at 85# (~70% 1RM) 1 min rest Double leg stance: 1x15 at 85# (~70% 1RM)   1 min rest Double leg stance: 1x15 at 85# (~70% 1RM) 1 min rest  Sled push (empty sled = 75#) 3 times 2x30 feet with 1-2 min seated rest between sets  SPT assistance with turning sled. Pushed sled with tall upright poles.   Unsupported sitting (edge of plinth, consider standing next session) Blazepods taps using distraction setting with 6 pods arranged 3 around each foot, tapping pods at random as fast as possible Round 1 (2 minutes): 118 hits (with hands behind back)  Blaze pod taps distraction setting with 6 pods arranged 3 around each foot,  tapping pods at random as fast as possible in standing at treadmill Round 1 (1 minute): 47 hits  1 min rest Round 2 (1 minute): 56 hits  2 min rest  Pt required multimodal cuing for proper technique and to facilitate improved neuromuscular control, strength, range of motion, and functional ability resulting in improved performance and form.   Theract:42 min (-2 min bathroom)  PATIENT EDUCATION:  Education details: Exercise purpose/form. Self management techniques. Education on diagnosis, prognosis, POC, anatomy and physiology of current condition.  Person educated: Patient Education method: Explanation, demonstration, verbal cuing, tactile cuing. Education comprehension: verbalized understanding, demonstrated understanding, and needs further education      HOME EXERCISE PROGRAM: Access Code: [email or web address] URL: [email or web address] Date: 08/20/2022 Prepared by: Norton Blizzard  Exercises - Sit to Stand Without Arm Support  - 3 x weekly - 3 sets - 5 reps - Seated Toe Raise  - 3 x weekly - 3 sets - 6 reps - 1 second hold - Supine Lower Trunk Rotation  - 3 x weekly - 1 sets - 20 reps - 1-5 seconds hold - Prone Press  Up  - 3 x weekly - 2 sets - 5 reps    ASSESSMENT:   CLINICAL IMPRESSION:  Patient arrives with rollator reporting continued right hip pain and no participation in HEP since last session. The focus of today's session was to continue progressing lower extremity functional strengthening/endurance and motor control to improve functional activity tolerance. Patient was able to tolerate interventions and complete all exercises with 1-2 minute rest breaks between sets. Patient reports her legs "feel like jelly" and right hip is feeling slightly aggravated with pain increase to 5/10 at end of session. Patient would benefit from continued management of limiting condition by skilled physical therapist to address remaining impairments and functional limitations to work towards  stated goals and return to PLOF or maximal functional independence.   OBJECTIVE IMPAIRMENTS Abnormal gait, decreased activity tolerance, decreased balance, decreased coordination, decreased endurance, decreased mobility, difficulty walking, decreased ROM, decreased strength, impaired perceived functional ability, increased muscle spasms, impaired tone, impaired UE functional use, improper body mechanics, postural dysfunction, and pain.    ACTIVITY LIMITATIONS cleaning, community activity, driving, meal prep, laundry, and shopping.    PERSONAL FACTORS Behavior pattern, Fitness, Past/current experiences, Time since onset of injury/illness/exacerbation, Transportation, and 3+ comorbidities:    muscular dystrophy (FSHD - facioscapulohumeral muscular dystrophy), anxiety and depression, bipolar 1 disorder, borderline personality disorder, diabetes, GERD, gout, pancreatitis, alcoholic liver disease, tobacco abuse,  foot drop, neuropathy, recovering alcoholic, history of drug abuse, history of hernia repair are also affecting patient's functional outcome.      REHAB POTENTIAL: Good   CLINICAL DECISION MAKING: Evolving/moderate complexity   EVALUATION COMPLEXITY: Moderate     GOALS: Goals reviewed with patient? No   SHORT TERM GOALS: Target date: 07/24/2021   Patient will be independent with initial home exercise program for self-management of symptoms. Baseline: Initial HEP to be provided at visit 2 as appropriate (07/10/21); Goal status: Met     LONG TERM GOALS: Target date: 10/02/2021; updated to 12/27/2021 for all unmet goals on 10/04/2021; updated to 03/26/2022 for all unmet goals on 01/01/2022; updated to 06/19/2022 for all unmet goals on 03/27/2022; updated to 08/14/2022 for all unmet goals on 05/22/2022, updated to 11/12/2022 for all unmet goals on 08/20/2022, updated to 01/09/2023 for all unmet goals on 10/17/2022, updated to 05/29/2023 for all unmet goals on 03/06/2023.    Patient will be independent with  a long-term home exercise program for self-management of symptoms.  Baseline: Initial HEP to be provided at visit 2 as appropriate (07/10/21); participating as tolerated, recently too much pain to participate (09/04/2021); participating regularly (10/04/2021); participating as able (11/21/2021); not participating regularly over last couple of weeks (01/01/2022); no participating regularly since last progress note (02/15/2022); improving participation (03/27/2022); has not last two weeks due to recent fall (05/17/22); has not been participating (08/20/2022); patient is participating in HEP 2x a week (10/17/2022); did not participate since last PT visit (01/22/2023); sporatic participation (03/06/2023);  Goal status:  In-progress   2.  Patient will demonstrate improved FOTO score by 10 points from baseline to demonstrate improvement in overall condition and self-reported functional ability.  Baseline: to be measured visit 2 as appropriate (07/10/21); 46 at visit # 2 (07/17/2021); 50 at visit # 10 (09/04/2021); 53 at visit #15 (10/04/2021); 50 at visit # 20 (11/21/2021); 52 at visit # 26 (01/01/2022); 50 at visit #30 (02/15/2022); 54 at visit # 34 (03/27/22);  53 (05/17/22); 51 at visit #44 (08/20/2022); 58 at visit #50 (10/17/2022); 51 at visit #60 (03/07/2023); Goal  status: ongoing   3.  Patient will ambulate equal or greater than 1000 feet during 6 Min Walk Test using [facility] or less restrictive assistive device to improve community ambulation.  Baseline: To be tested visit 2 as appropriate (07/10/21); 310 feet with RW (07/17/2021); 556 feet with rollator (09/04/2021); 600 feet with rollator (10/04/2021); 300 feet with rollator - had to stop test at 3 min due to intolerable right glute pain (11/21/2021); 420 feet with rollator and steppage gait, R > L. Stopped at 5 min due to feeling of instability in low back to mid thoracic spine (01/01/2022); 573 feet with rollator and steppage gait, R > L. Occasional standing breaks. Low back pain  increased to 3.5/10 (02/15/2022);  381 feet with rollator and steppage gait, R > L. Stopped at 4:30 min due to pain in low back and right glute (03/27/2022); 124' in 1 minute 22 sec. Had to discontinue due to R back/hip pain (05/17/2022); 200 feet with rollator, discontinued due to low back/glute pain (08/20/2022); 500 feet with rollator (10/17/2022); 204 feet with rollator and steppage gait, R > L. R foot in orthopedic shoe (03/06/2023);  Goal status: ongoing   4.  Patient will complete 5 Time Sit To Stand test in equal or less than 20 seconds with no UE support from 18.5 inch plinth to demonstrate improved LE functional strength and power for basic mobility and improved fall risk.  Baseline: 24 seconds with B UE on 18.5 inch plinth.  (07/10/21); 20 seconds with B UE on thighs from 18.5 inch plinth (09/04/2021); 19 seconds with B UE on thighs from 18.5 inch plinth (10/04/2021); 22 seconds with B UE on thighs from 18.5 inch plinth (11/21/2021); 20 seconds with B UE on thighs from 18.5 inch plinth (01/01/2022); 19 seconds without use of B UE support (02/15/2022); 21 seconds with no UE support from 18.5 inch plinth (03/27/2022); 20.5 seconds no UE support and 18" surface (05/17/2022); 20 seconds with no UE support from 18.5 inch plinth (08/20/2022); 18 seconds with no UE support from 18.5 inch plinth. Did lean back of legs on plinth for stability for 1-2 reps (03/06/2023);  Goal status: in progress     5.  Patient be able to ascend and descend stairs at her home mod I with use of B handrails.  Baseline: able to ascend/descend 4 steps in the clinic with B UE support on handrails and supervision - reports currently needs assistance to descend steps at home. (07/10/21); able to ascend/descend 4 steps in the clinic with B UE support on handrails and supervision, leading up and down with left. able to perform with reversed LE leads (09/04/2021); able to ascend/descend 4 steps in the clinic with B UE support on handrails and  supervision, step over step with heavy UE use on the way down (10/04/2021); able to ascend/descend 4 steps in the clinic with B UE support on handrails and supervision, step over step up and step to leading with R LE with heavy UE use on the way down (11/21/2021); ascend/descends 4 steps in the clinic with B UE support on handrails and supervision, step over step up and down with heavy UE use on the way down (01/01/2022); able to ascend/descend 4 steps in the clinic with B UE support on handrails and supervision, step over step up and down with heavy UE use on the way down. leads up with right foot step to gait (02/15/2022; 03/27/22); able to ascend/descend 4 steps in the clinic with B UE support  on handrails and supervision, step over step up and down with moderately heavy UE use on the way down (08/20/2022); able to ascend/descend 4 steps in the clinic with B UE support on handrails and supervision, step over step up and down with UE use on the way down (10/17/2022; 03/06/23);   Goal status: MET   6.  Patient will demonstrate ability to perform static balance in tandem stance for equal or greater than 30 seconds on each side, to demonstrate improved balance and decreased fall risk.  Baseline: right foot front 10 seconds, left foot front 7 seconds (07/10/2021); right foot front 4 seconds, left foot front 6 seconds (09/04/2021); right foot front 9 seconds, left foot front 31 seconds (10/04/2021); right foot front 6.5 seconds, left foot front 8.5 seconds (11/21/2021); right foot front 8 seconds, left foot front 7 seconds (01/01/2022);  right foot front 9 seconds, left foot front 20 seconds (02/15/2022); right foot front 30 seconds, left foot front 45 seconds (03/27/2022); right foot front 16 seconds, left foot front 17 seconds (08/20/2022); right foot front 26 seconds, left foot front 43 seconds (10/17/2022); Right foot front 35 seconds, left foot front 7 seconds (03/06/2023);  Goal status: MET 03/27/22, regressed 03/16/23  7.   Patient will demonstrate improvement in Patient Specific Functional Scale (PSFS) of equal or greater than 3 points to reflect clinically significant improvement in patient's most valued functional activities. Baseline: 4 (03/06/2023); Goal status: NEW 03/06/2023  8.  Patient will demonstrate equal or greater than 10% improvement (132#) in calculated 1RM for double leg press to improve her LE strength for functional activities such as stairs and transfers.  Baseline: 120.3# (03/06/2023);  Goal status: NEW (03/06/2023)     PLAN: PT FREQUENCY: 2x/week   PT DURATION: 12 weeks   PLANNED INTERVENTIONS: Therapeutic exercises, Therapeutic activity, Neuromuscular re-education, Balance training, Gait training, Patient/Family education, Joint mobilization, Orthotic/Fit training, DME instructions, Dry Needling, Electrical stimulation, Cryotherapy, Moist heat, Splintting, Taping, and Manual therapy   PLAN FOR NEXT SESSION: progressive LE/functional strength/balance/muscular endurance as tolerated. Manual therapy for pain control as needed.   Tyleah Loh [place], SPT [employer] DPTE  Huntley Dec R. Ilsa Iha, PT, DPT 04/30/23, 6:51 PM  [facility] [address] [place], [place] [address] P: [phone] I F: [phone]

## 2023-05-02 DIAGNOSIS — Z79899 Other long term (current) drug therapy: Secondary | ICD-10-CM | POA: Diagnosis not present

## 2023-05-02 DIAGNOSIS — G629 Polyneuropathy, unspecified: Secondary | ICD-10-CM | POA: Diagnosis not present

## 2023-05-02 DIAGNOSIS — M109 Gout, unspecified: Secondary | ICD-10-CM | POA: Diagnosis not present

## 2023-05-02 DIAGNOSIS — H9193 Unspecified hearing loss, bilateral: Secondary | ICD-10-CM | POA: Diagnosis not present

## 2023-05-02 DIAGNOSIS — E785 Hyperlipidemia, unspecified: Secondary | ICD-10-CM | POA: Diagnosis not present

## 2023-05-02 DIAGNOSIS — K219 Gastro-esophageal reflux disease without esophagitis: Secondary | ICD-10-CM | POA: Diagnosis not present

## 2023-05-02 DIAGNOSIS — E1142 Type 2 diabetes mellitus with diabetic polyneuropathy: Secondary | ICD-10-CM | POA: Diagnosis not present

## 2023-05-07 ENCOUNTER — Ambulatory Visit: Payer: 59 | Admitting: Physical Therapy

## 2023-05-08 ENCOUNTER — Ambulatory Visit: Payer: 59 | Admitting: Physical Therapy

## 2023-05-13 ENCOUNTER — Encounter: Payer: Self-pay | Admitting: Physical Therapy

## 2023-05-13 ENCOUNTER — Ambulatory Visit: Payer: 59 | Admitting: Physical Therapy

## 2023-05-13 DIAGNOSIS — Z9181 History of falling: Secondary | ICD-10-CM

## 2023-05-13 DIAGNOSIS — M6281 Muscle weakness (generalized): Secondary | ICD-10-CM

## 2023-05-13 DIAGNOSIS — R2689 Other abnormalities of gait and mobility: Secondary | ICD-10-CM | POA: Diagnosis not present

## 2023-05-13 NOTE — Therapy (Signed)
 OUTPATIENT PHYSICAL THERAPY TREATMENT   Patient Name: [name] MRN: [id number] DOB:1976-02-14, 48 y.o., female Today's Date: 05/13/2023  PCP: Eunice Blase, PA-C REFERRING PROVIDER: Eunice Blase, PA-C  END OF SESSION:   PT End of Session - 05/13/23 0910     Visit Number 66    Number of Visits 74    Date for PT Re-Evaluation 05/29/23    Authorization Type [employer] MEDICARE/Medicaid reporting period from 10/17/2022    Progress Note Due on Visit 70    PT Start Time 0908    PT Stop Time 0946    PT Time Calculation (min) 38 min    Activity Tolerance Patient tolerated treatment well;Patient limited by fatigue    Behavior During Therapy [facility] for tasks assessed/performed                Past Medical History:  Diagnosis Date   Alcohol abuse    Anxiety    Anxiety    Anxiety and depression    Asthma    Bipolar disorder ([name])    Borderline personality disorder ([name])    Depression    Diabetes ([name])    Type 2    Dyslipidemia    Esophageal varices ([name])    Foot drop    right   [name] ([name])    GERD (gastroesophageal reflux disease)    Gout    History of drug abuse ([name])    Insomnia    Muscular dystrophies ([name])    Neuropathy    Pancreatitis    Pneumonia    Recovering alcoholic ([name])    Past Surgical History:  Procedure Laterality Date   ESOPHAGOGASTRODUODENOSCOPY N/A 04/25/2022   Procedure: ESOPHAGOGASTRODUODENOSCOPY (EGD);  Surgeon: [name], MD;  Location: [facility] ENDOSCOPY;  Service: Gastroenterology;  Laterality: N/A;   HERNIA REPAIR     IR FLUORO GUIDE CV LINE RIGHT  12/30/2020   IR REMOVAL TUN CV CATH W/O FL  01/19/2021   IR US GUIDE VASC ACCESS RIGHT  12/30/2020   Patient Active Problem List   Diagnosis Date Noted   Gait abnormality 05/24/2022   Right hand weakness 05/24/2022   Frequent falls 02/12/2021   Leukocytosis    Aspiration pneumonia of both lower lobes due to gastric secretions ([name]) 01/04/2021   Pressure  injury of skin 12/24/2020   Acute hepatic encephalopathy ([name]) 12/20/2020   Sepsis ([name]) 12/19/2020   Acute hyponatremia 12/18/2020   Alcohol use disorder, moderate, dependence ([name]) 12/18/2020   Hepatic steatosis 12/18/2020   Polysubstance abuse ([name]) 12/18/2020   Hyperglycemia due to type 2 diabetes mellitus ([name]) 12/18/2020   Hyperbilirubinemia 12/18/2020   Lactic acidosis 12/18/2020   Alcoholic liver disease ([name]) 12/18/2020   Bipolar 1 disorder, manic, moderate ([name]) 09/10/2018   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   Diabetes ([name]) 09/10/2018   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   Chronic gout of foot 09/29/2015   Muscular dystrophy ([name]) 09/29/2015   Type 2 diabetes mellitus without complication ([name]) 09/29/2015   Anxiety and depression 10/01/2012   Protein-calorie malnutrition, severe ([name]) 09/30/2012   Trichomonas infection 09/26/2012   Dehydration 09/26/2012   Hypokalemia 11/14/2011   Thrombocytopenia ([name]) 11/14/2011   Bipolar 2 disorder ([name]) 11/13/2011   Gout 11/13/2011     REFERRING DIAG: bilateral leg weakness  THERAPY DIAG:  Other abnormalities of gait and mobility  Muscle weakness (generalized)  History of falling  Rationale for Evaluation and Treatment Rehabilitation  PERTINENT HISTORY: Patient is a 48 y.o. female who presents  to outpatient physical therapy with a referral for medical diagnosis bilateral leg weakness. This patient's chief complaints consist of difficulty with weakness and balance with history of frequent falling leading to the following functional deficits: difficulty with basic mobility including ambulation, stairs, transfers; difficulty with ADLs, IADLs. Currently requires assistance to navigate stairs, assistance with ADLs and  IADLs, and ambulates with RW when she previously did not need an assistive device. Relevant past medical history and comorbidities include muscular dystrophy (FSHD - facioscapulohumeral  muscular dystrophy), anxiety and depression, bipolar 1 disorder, borderline personality disorder, diabetes, GERD, gout, pancreatitis, alcoholic liver disease, tobacco abuse,  foot drop, neuropathy, recovering alcoholic, history of drug abuse, history of hernia repair.  Patient denies hx of cancer, stroke, seizures, lung problems, heart problems, unexplained weight loss, unexplained changes in bowel or bladder problems, unexplained stumbling or dropping things, osteoporosis, and spinal surgery.   PRECAUTIONS: Fall, not drink alcohol.  SUBJECTIVE:  Patient states she is sore in her low back, back of hips, bilateral groin, and quads. Her roommate/cousin has been doing worse and is now sleeping on the couch. Patient has been helping her with ADLs such as getting to the bedside commode and bathing. She has not done her HEP because she was hurting from the increased activity. Patient has not fallen recently but her cousin did and needed EMS to help her up. She had generalized soreness and fatigue after last PT session. She continues to have the right glute pain   PAIN: NPRS 7/10 low back, bilateral posterior hip and groin, quads.   OBJECTIVE   1 RM (calculated):  Double leg press at seat position 1: 120.3# (last measured 03/06/2023)     TODAY'S TREATMENT:  Therapeutic activities: dynamic therapeutic activities incorporating MULTIPLE parameters or areas of the body designed to achieve improved functional performance. Seated leg press, seat position 1, to improve strength with transfers and climbing stairs:  Double leg stance: 1x20 at 35#  1 min rest Double leg stance: 1x15 at 85# (~70% 1RM) 1 min rest Double leg stance: 1x15 at 85# (~70% 1RM)   90 second rest Double leg stance: 1x15 at 85# (~70% 1RM) 90 second rest  Sled push (empty sled = 75#) 3 times 2x30 feet with 1-2 min seated rest between sets  PT assistance with turning sled. Pushed sled with tall upright poles.   Standing with B UE  support on TM to improve core strength, speed of movement, hip flexion, and endurance for stepping tasks.  Blaze pod taps distraction setting with 6 pods arranged 3 around each foot (2 of them elevated on treadmill edge), tapping pods at random as fast as possible in standing at treadmill Round 1 (30 seconds): 27 hits  1 min seated rest Round 2 (1 minute): 45 hits  1 min seated rest Round 3 (1 minute): 48 hits  1 min seated rest Round 4 (1 minute): 50 hits   Pt required multimodal cuing for proper technique and to facilitate improved neuromuscular control, strength, range of motion, and functional ability resulting in improved performance and form.   PATIENT EDUCATION:  Education details: Exercise purpose/form. Self management techniques. Education on diagnosis, prognosis, POC, anatomy and physiology of current condition.  Person educated: Patient Education method: Explanation, demonstration, verbal cuing, tactile cuing. Education comprehension: verbalized understanding, demonstrated understanding, and needs further education      HOME EXERCISE PROGRAM: Access Code: [email or web address] URL: [email or web address] Date: 08/20/2022 Prepared by: Norton Blizzard  Exercises - Sit to Stand Without Arm Support  -  3 x weekly - 3 sets - 5 reps - Seated Toe Raise  - 3 x weekly - 3 sets - 6 reps - 1 second hold - Supine Lower Trunk Rotation  - 3 x weekly - 1 sets - 20 reps - 1-5 seconds hold - Prone Press Up  - 3 x weekly - 2 sets - 5 reps    ASSESSMENT:   CLINICAL IMPRESSION:  Patient arrives with rollator reporting increased lumbopelvic/hip pain following increase in physical demands at home with her roommate needing more assistance with ADLs and IADLs. She continued working on improved LE/functional strength/endurance/balance. She continues to fatigue quickly and needs seated rests of at least 1 min between most sets of each exercise. She reported no increase in pain by end of session but  was fatigued in B LE. Patient would benefit from continued management of limiting condition by skilled physical therapist to address remaining impairments and functional limitations to work towards stated goals and return to PLOF or maximal functional independence.   OBJECTIVE IMPAIRMENTS Abnormal gait, decreased activity tolerance, decreased balance, decreased coordination, decreased endurance, decreased mobility, difficulty walking, decreased ROM, decreased strength, impaired perceived functional ability, increased muscle spasms, impaired tone, impaired UE functional use, improper body mechanics, postural dysfunction, and pain.    ACTIVITY LIMITATIONS cleaning, community activity, driving, meal prep, laundry, and shopping.    PERSONAL FACTORS Behavior pattern, Fitness, Past/current experiences, Time since onset of injury/illness/exacerbation, Transportation, and 3+ comorbidities:    muscular dystrophy (FSHD - facioscapulohumeral muscular dystrophy), anxiety and depression, bipolar 1 disorder, borderline personality disorder, diabetes, GERD, gout, pancreatitis, alcoholic liver disease, tobacco abuse,  foot drop, neuropathy, recovering alcoholic, history of drug abuse, history of hernia repair are also affecting patient's functional outcome.      REHAB POTENTIAL: Good   CLINICAL DECISION MAKING: Evolving/moderate complexity   EVALUATION COMPLEXITY: Moderate     GOALS: Goals reviewed with patient? No   SHORT TERM GOALS: Target date: 07/24/2021   Patient will be independent with initial home exercise program for self-management of symptoms. Baseline: Initial HEP to be provided at visit 2 as appropriate (07/10/21); Goal status: Met     LONG TERM GOALS: Target date: 10/02/2021; updated to 12/27/2021 for all unmet goals on 10/04/2021; updated to 03/26/2022 for all unmet goals on 01/01/2022; updated to 06/19/2022 for all unmet goals on 03/27/2022; updated to 08/14/2022 for all unmet goals on 05/22/2022,  updated to 11/12/2022 for all unmet goals on 08/20/2022, updated to 01/09/2023 for all unmet goals on 10/17/2022, updated to 05/29/2023 for all unmet goals on 03/06/2023.    Patient will be independent with a long-term home exercise program for self-management of symptoms.  Baseline: Initial HEP to be provided at visit 2 as appropriate (07/10/21); participating as tolerated, recently too much pain to participate (09/04/2021); participating regularly (10/04/2021); participating as able (11/21/2021); not participating regularly over last couple of weeks (01/01/2022); no participating regularly since last progress note (02/15/2022); improving participation (03/27/2022); has not last two weeks due to recent fall (05/17/22); has not been participating (08/20/2022); patient is participating in HEP 2x a week (10/17/2022); did not participate since last PT visit (01/22/2023); sporatic participation (03/06/2023);  Goal status:  In-progress   2.  Patient will demonstrate improved FOTO score by 10 points from baseline to demonstrate improvement in overall condition and self-reported functional ability.  Baseline: to be measured visit 2 as appropriate (07/10/21); 46 at visit # 2 (07/17/2021); 50 at visit # 10 (09/04/2021); 53 at visit #15 (10/04/2021); 50  at visit # 20 (11/21/2021); 52 at visit # 26 (01/01/2022); 50 at visit #30 (02/15/2022); 54 at visit # 34 (03/27/22);  53 (05/17/22); 51 at visit #44 (08/20/2022); 58 at visit #50 (10/17/2022); 51 at visit #60 (03/07/2023); Goal status: ongoing   3.  Patient will ambulate equal or greater than 1000 feet during 6 Min Walk Test using [facility] or less restrictive assistive device to improve community ambulation.  Baseline: To be tested visit 2 as appropriate (07/10/21); 310 feet with RW (07/17/2021); 556 feet with rollator (09/04/2021); 600 feet with rollator (10/04/2021); 300 feet with rollator - had to stop test at 3 min due to intolerable right glute pain (11/21/2021); 420 feet with rollator and steppage  gait, R > L. Stopped at 5 min due to feeling of instability in low back to mid thoracic spine (01/01/2022); 573 feet with rollator and steppage gait, R > L. Occasional standing breaks. Low back pain increased to 3.5/10 (02/15/2022);  381 feet with rollator and steppage gait, R > L. Stopped at 4:30 min due to pain in low back and right glute (03/27/2022); 124' in 1 minute 22 sec. Had to discontinue due to R back/hip pain (05/17/2022); 200 feet with rollator, discontinued due to low back/glute pain (08/20/2022); 500 feet with rollator (10/17/2022); 204 feet with rollator and steppage gait, R > L. R foot in orthopedic shoe (03/06/2023);  Goal status: ongoing   4.  Patient will complete 5 Time Sit To Stand test in equal or less than 20 seconds with no UE support from 18.5 inch plinth to demonstrate improved LE functional strength and power for basic mobility and improved fall risk.  Baseline: 24 seconds with B UE on 18.5 inch plinth.  (07/10/21); 20 seconds with B UE on thighs from 18.5 inch plinth (09/04/2021); 19 seconds with B UE on thighs from 18.5 inch plinth (10/04/2021); 22 seconds with B UE on thighs from 18.5 inch plinth (11/21/2021); 20 seconds with B UE on thighs from 18.5 inch plinth (01/01/2022); 19 seconds without use of B UE support (02/15/2022); 21 seconds with no UE support from 18.5 inch plinth (03/27/2022); 20.5 seconds no UE support and 18" surface (05/17/2022); 20 seconds with no UE support from 18.5 inch plinth (08/20/2022); 18 seconds with no UE support from 18.5 inch plinth. Did lean back of legs on plinth for stability for 1-2 reps (03/06/2023);  Goal status: in progress     5.  Patient be able to ascend and descend stairs at her home mod I with use of B handrails.  Baseline: able to ascend/descend 4 steps in the clinic with B UE support on handrails and supervision - reports currently needs assistance to descend steps at home. (07/10/21); able to ascend/descend 4 steps in the clinic with B UE support  on handrails and supervision, leading up and down with left. able to perform with reversed LE leads (09/04/2021); able to ascend/descend 4 steps in the clinic with B UE support on handrails and supervision, step over step with heavy UE use on the way down (10/04/2021); able to ascend/descend 4 steps in the clinic with B UE support on handrails and supervision, step over step up and step to leading with R LE with heavy UE use on the way down (11/21/2021); ascend/descends 4 steps in the clinic with B UE support on handrails and supervision, step over step up and down with heavy UE use on the way down (01/01/2022); able to ascend/descend 4 steps in the clinic with B UE  support on handrails and supervision, step over step up and down with heavy UE use on the way down. leads up with right foot step to gait (02/15/2022; 03/27/22); able to ascend/descend 4 steps in the clinic with B UE support on handrails and supervision, step over step up and down with moderately heavy UE use on the way down (08/20/2022); able to ascend/descend 4 steps in the clinic with B UE support on handrails and supervision, step over step up and down with UE use on the way down (10/17/2022; 03/06/23);   Goal status: MET   6.  Patient will demonstrate ability to perform static balance in tandem stance for equal or greater than 30 seconds on each side, to demonstrate improved balance and decreased fall risk.  Baseline: right foot front 10 seconds, left foot front 7 seconds (07/10/2021); right foot front 4 seconds, left foot front 6 seconds (09/04/2021); right foot front 9 seconds, left foot front 31 seconds (10/04/2021); right foot front 6.5 seconds, left foot front 8.5 seconds (11/21/2021); right foot front 8 seconds, left foot front 7 seconds (01/01/2022);  right foot front 9 seconds, left foot front 20 seconds (02/15/2022); right foot front 30 seconds, left foot front 45 seconds (03/27/2022); right foot front 16 seconds, left foot front 17 seconds (08/20/2022);  right foot front 26 seconds, left foot front 43 seconds (10/17/2022); Right foot front 35 seconds, left foot front 7 seconds (03/06/2023);  Goal status: MET 03/27/22, regressed 03/16/23  7.  Patient will demonstrate improvement in Patient Specific Functional Scale (PSFS) of equal or greater than 3 points to reflect clinically significant improvement in patient's most valued functional activities. Baseline: 4 (03/06/2023); Goal status: NEW 03/06/2023  8.  Patient will demonstrate equal or greater than 10% improvement (132#) in calculated 1RM for double leg press to improve her LE strength for functional activities such as stairs and transfers.  Baseline: 120.3# (03/06/2023);  Goal status: NEW (03/06/2023)     PLAN: PT FREQUENCY: 2x/week   PT DURATION: 12 weeks   PLANNED INTERVENTIONS: Therapeutic exercises, Therapeutic activity, Neuromuscular re-education, Balance training, Gait training, Patient/Family education, Joint mobilization, Orthotic/Fit training, DME instructions, Dry Needling, Electrical stimulation, Cryotherapy, Moist heat, Splintting, Taping, and Manual therapy   PLAN FOR NEXT SESSION: progressive LE/functional strength/balance/muscular endurance as tolerated. Manual therapy for pain control as needed.   [name]. Ilsa Iha, PT, DPT 05/13/23, 9:51 AM  [facility] [address] [place], [place] [address] P: [phone] I F: [phone]

## 2023-05-15 ENCOUNTER — Ambulatory Visit: Payer: 59 | Admitting: Physical Therapy

## 2023-05-19 IMAGING — CR DG CHEST 2V
1 series · 3 of 3 positions shown · non-contrast
Comparison: 5900

CLINICAL DATA: COVID

EXAM:
CHEST - 2 VIEW

[Series 1: dg chest 2 view · 0.14mm/px · 3 of 3 slices shown]
[im 1/3]
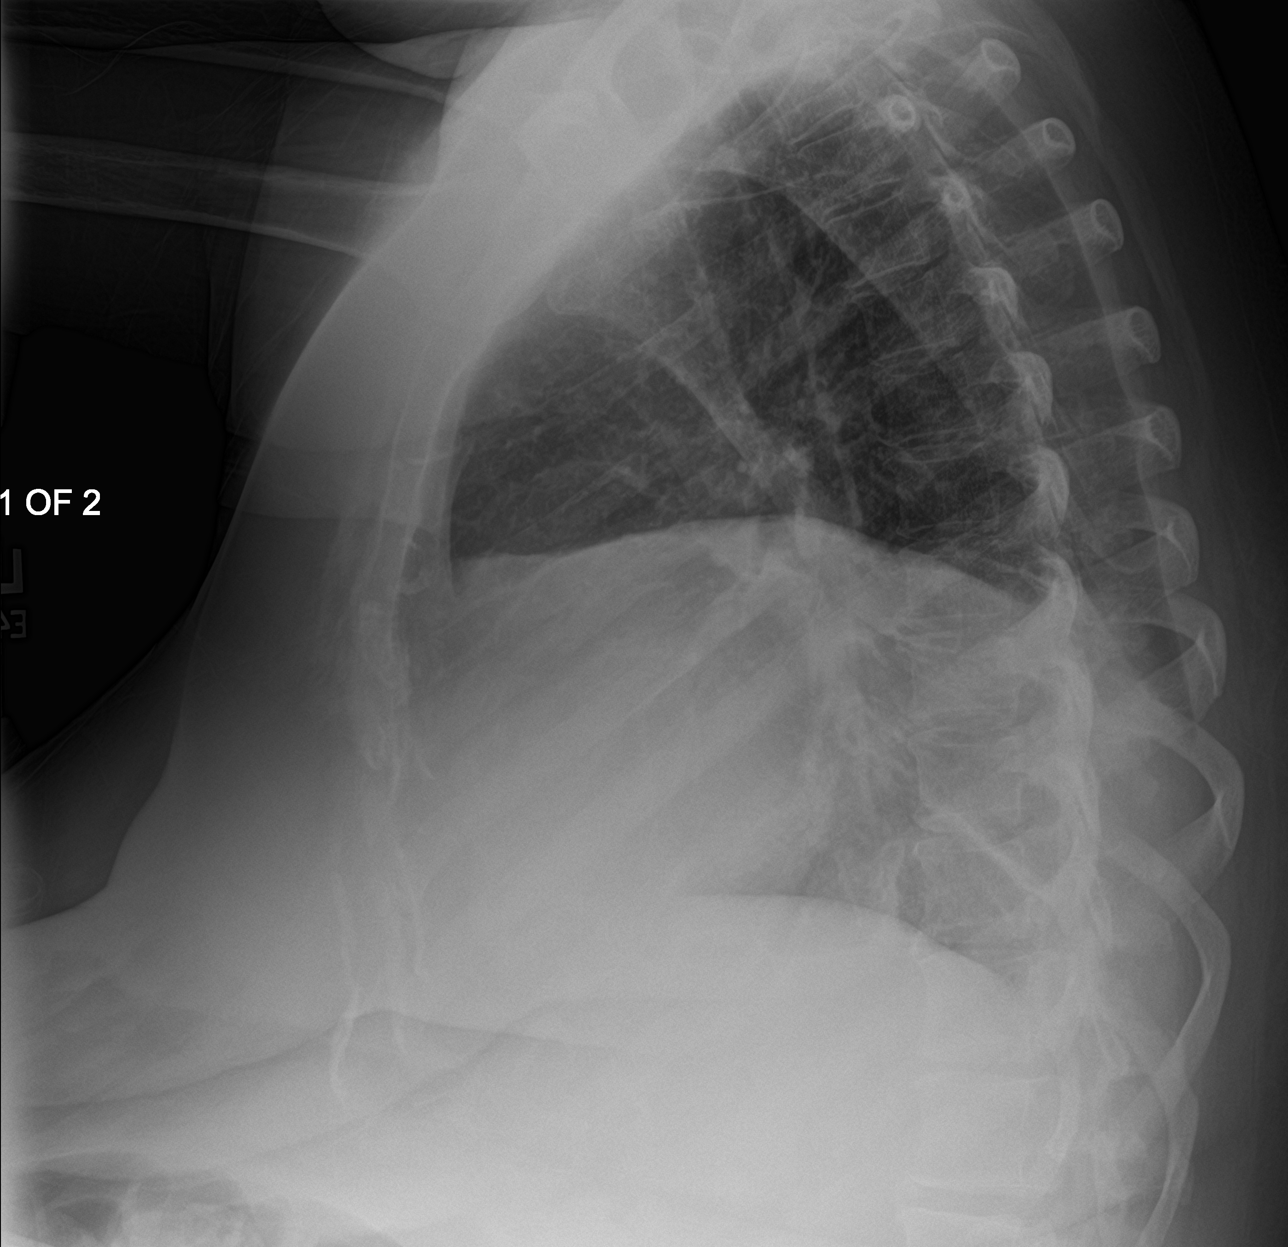
[im 2/3]
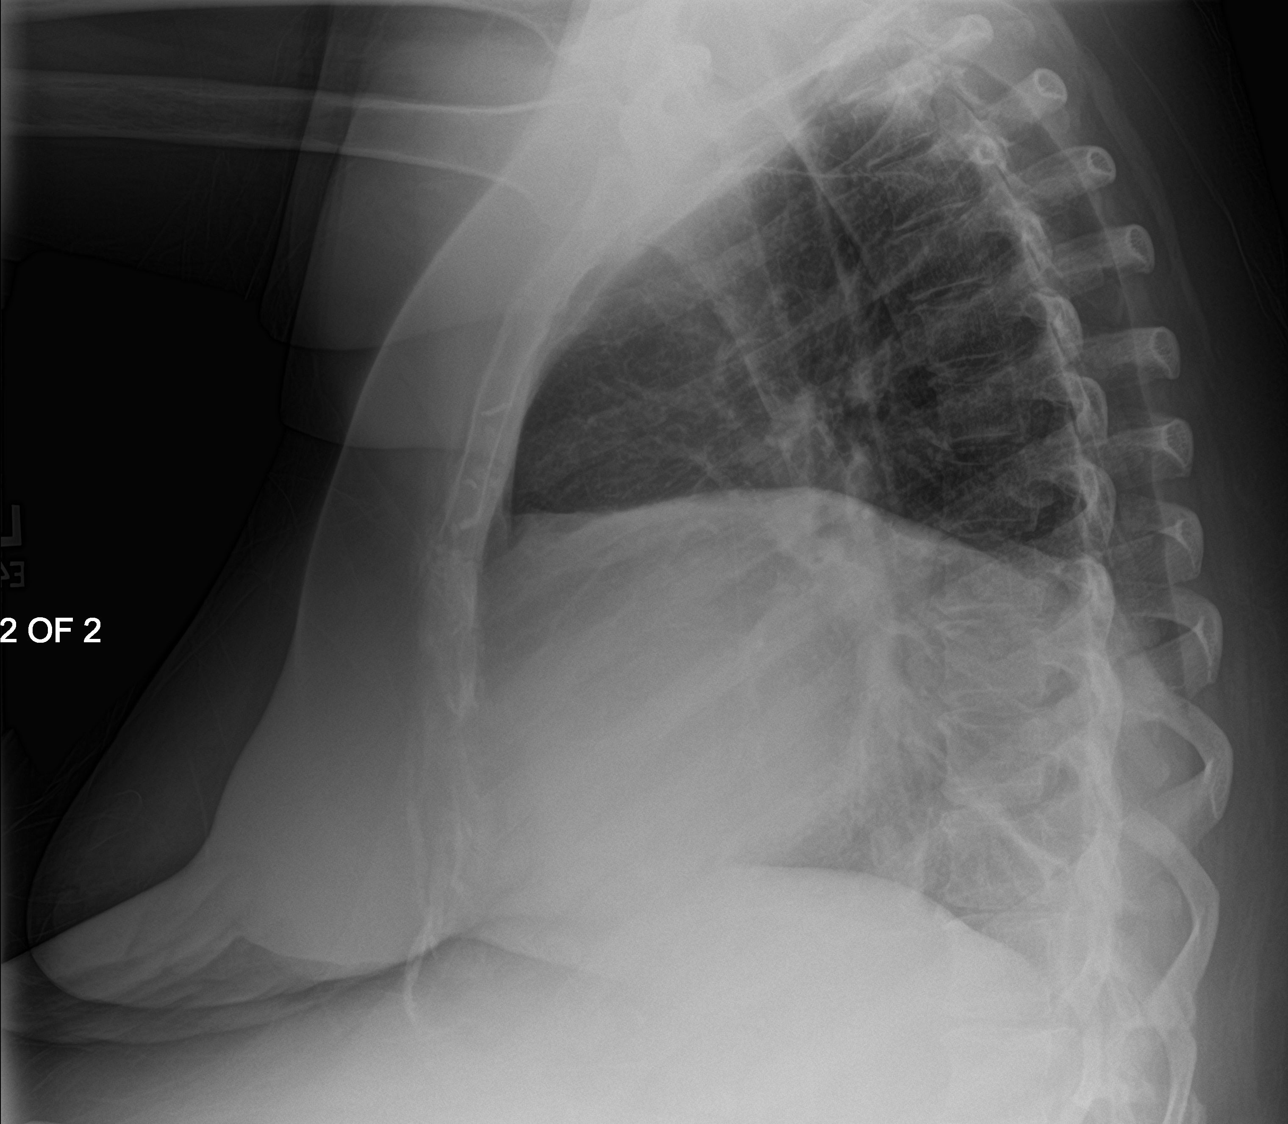
[im 3/3]
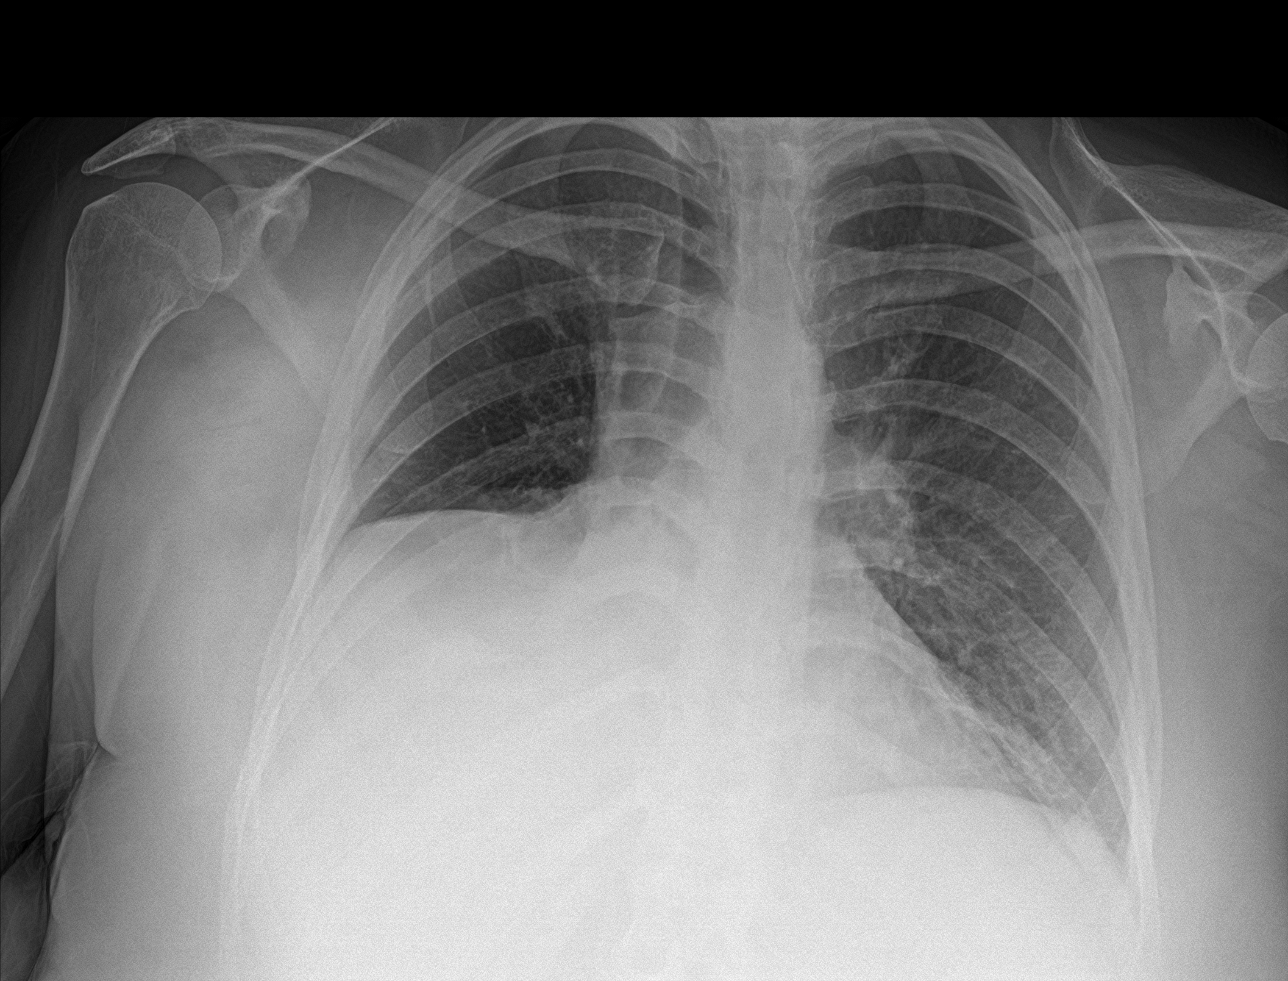

[3 of 3 positions shown; findings below may reference images not displayed]

FINDINGS: Elevation of the right hemidiaphragm. This is present on a 1687
abdominal CT. Mild interstitial prominence at the left lung base.
Normal heart size. No significant osseous abnormality.
IMPRESSION: Chronic elevation of the right hemidiaphragm. Mild interstitial
prominence at the left lung base may reflect sequelae of reported
COVID.

## 2023-05-20 ENCOUNTER — Ambulatory Visit: Payer: 59 | Admitting: Physical Therapy

## 2023-05-27 ENCOUNTER — Ambulatory Visit: Admitting: Physical Therapy

## 2023-06-05 ENCOUNTER — Ambulatory Visit: Payer: 59 | Admitting: Physical Therapy

## 2023-06-12 ENCOUNTER — Ambulatory Visit: Payer: 59 | Admitting: Physical Therapy

## 2023-06-13 ENCOUNTER — Telehealth: Payer: Self-pay | Admitting: Physical Therapy

## 2023-06-13 NOTE — Telephone Encounter (Signed)
 Called patient to discuss frequent last minute cancellations and check on her wellbeing and plans for PT. Patient apologetic about most recent last minute cancel. States she is planning to come on 4/1 and called in advance to cancel 4/3 because she knew she cannot come. She was informed that we need her to cancel at least 24 hours in advance or we may need to remove her appointments from the schedule. She said she understood and reminded PT that she has experienced a lot of things outside of her control that leads her to cancel last minute. She agreed to keep communication and understood she is to cancel at least 24 hours in advance.  [name]. Ilsa Iha, PT, DPT 06/13/23, 6:10 PM  [facility] [address] [place], [place] [address] P: [phone] I F: [phone]

## 2023-06-18 ENCOUNTER — Ambulatory Visit: Payer: 59 | Attending: Internal Medicine | Admitting: Physical Therapy

## 2023-06-18 ENCOUNTER — Encounter: Payer: Self-pay | Admitting: Physical Therapy

## 2023-06-18 DIAGNOSIS — R2689 Other abnormalities of gait and mobility: Secondary | ICD-10-CM | POA: Insufficient documentation

## 2023-06-18 DIAGNOSIS — M6281 Muscle weakness (generalized): Secondary | ICD-10-CM | POA: Insufficient documentation

## 2023-06-18 DIAGNOSIS — Z9181 History of falling: Secondary | ICD-10-CM | POA: Diagnosis not present

## 2023-06-18 NOTE — Therapy (Signed)
 OUTPATIENT PHYSICAL THERAPY TREATMENT / PROGRESS NOTE / RE-CERTIFICATION Dates of reporting from 03/06/2023 to 06/18/2023   Patient Name: [name] MRN: [id number] DOB:October 17, 1975, 48 y.o., female Today's Date: 06/18/2023  PCP: Eunice Blase, PA-C REFERRING PROVIDER: Eunice Blase, PA-C  END OF SESSION:   PT End of Session - 06/18/23 1920     Visit Number [id number]    Number of Visits 90    Date for PT Re-Evaluation 09/10/23    Authorization Type [employer] MEDICARE/Medicaid reporting period from 03/06/2023    Progress Note Due on Visit 70    PT Start Time 1739    PT Stop Time 1815    PT Time Calculation (min) 36 min    Activity Tolerance Patient tolerated treatment well;Patient limited by fatigue    Behavior During Therapy WFL for tasks assessed/performed                 Past Medical History:  Diagnosis Date   Alcohol abuse    Anxiety    Anxiety    Anxiety and depression    Asthma    Bipolar disorder ([name])    Borderline personality disorder ([name])    Depression    Diabetes ([name])    Type 2    Dyslipidemia    Esophageal varices ([name])    Foot drop    right   [name] ([name])    GERD (gastroesophageal reflux disease)    Gout    History of drug abuse ([name])    Insomnia    Muscular dystrophies ([name])    Neuropathy    Pancreatitis    Pneumonia    Recovering alcoholic ([name])    Past Surgical History:  Procedure Laterality Date   ESOPHAGOGASTRODUODENOSCOPY N/A 04/25/2022   Procedure: ESOPHAGOGASTRODUODENOSCOPY (EGD);  Surgeon: [name], MD;  Location: [facility] END[facility]COPY;  Service: Gastroenterology;  Laterality: N/A;   HERNIA REPAIR     IR FLUORO GUIDE CV LINE RIGHT  12/30/2020   IR REMOVAL TUN CV CATH W/O FL  01/19/2021   IR US GUIDE VASC ACCESS RIGHT  12/30/2020   Patient Active Problem List   Diagnosis Date Noted   Gait abnormality 05/24/2022   Right hand weakness 05/24/2022   Frequent falls 02/12/2021   Leukocytosis    Aspiration  pneumonia of both lower lobes due to gastric secretions ([name]) 01/04/2021   Pressure injury of skin 12/24/2020   Acute hepatic encephalopathy ([name]) 12/20/2020   Sepsis ([name]) 12/19/2020   Acute hyponatremia 12/18/2020   Alcohol use disorder, moderate, dependence ([name]) 12/18/2020   Hepatic steatosis 12/18/2020   Polysubstance abuse ([name]) 12/18/2020   Hyperglycemia due to type 2 diabetes mellitus ([name]) 12/18/2020   Hyperbilirubinemia 12/18/2020   Lactic acidosis 12/18/2020   Alcoholic liver disease ([name]) 12/18/2020   Bipolar 1 disorder, manic, moderate ([name]) 09/10/2018   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   Diabetes ([name]) 09/10/2018   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   Chronic gout of foot 09/29/2015   Muscular dystrophy ([name]) 09/29/2015   Type 2 diabetes mellitus without complication ([name]) 09/29/2015   Anxiety and depression 10/01/2012   Protein-calorie malnutrition, severe ([name]) 09/30/2012   Trichomonas infection 09/26/2012   Dehydration 09/26/2012   Hypokalemia 11/14/2011   Thrombocytopenia ([name]) 11/14/2011   Bipolar 2 disorder ([name]) 11/13/2011   Gout 11/13/2011     REFERRING DIAG: bilateral leg weakness  THERAPY DIAG:  Other abnormalities of gait and mobility  Muscle weakness (generalized)  History of falling  Rationale for Evaluation and  Treatment Rehabilitation  PERTINENT HISTORY: Patient is a 48 y.o. female who presents to outpatient physical therapy with a referral for medical diagnosis bilateral leg weakness. This patient's chief complaints consist of difficulty with weakness and balance with history of frequent falling leading to the following functional deficits: difficulty with basic mobility including ambulation, stairs, transfers; difficulty with ADLs, IADLs. Currently requires assistance to navigate stairs, assistance with ADLs and  IADLs, and ambulates with RW when she previously did not need an assistive device. Relevant past medical  history and comorbidities include muscular dystrophy (FSHD - facioscapulohumeral muscular dystrophy), anxiety and depression, bipolar 1 disorder, borderline personality disorder, diabetes, GERD, gout, pancreatitis, alcoholic liver disease, tobacco abuse,  foot drop, neuropathy, recovering alcoholic, history of drug abuse, history of hernia repair.  Patient denies hx of cancer, stroke, seizures, lung problems, heart problems, unexplained weight loss, unexplained changes in bowel or bladder problems, unexplained stumbling or dropping things, osteoporosis, and spinal surgery.   PRECAUTIONS: Fall, not drink alcohol.  SUBJECTIVE:  Patient reports she has been doing her sit <> stands and other home exercises with her cousin/roommate Dawn who recently got back from the hospital for sepsis. Patient states she has been doing all of the household chores and helping care for her cousin. She had to walk a long ways to get her instacart order yesterday that caused more pain in her left glute (where she has not had pain before).   PAIN: NPRS 2/10 right glute  OBJECTIVE     SELF-REPORTED FUNCTION Patient Specific Functional Scale (PSFS)  Basic tidying and cleaning: 7 Stairs getting in and out of house: 6 Average: 6.5/10   FUNCTIONAL/BALANCE TESTS: 6-Minute Walk Test: Purpose: to assess function with community mobility Score: 300 feet with rollator and steppage gait, R > L.  Stopped with 2:36 left in test due to fatigue and lower back feeling unstable.  HR 116 bpm, and SpO2 98% at end of test Analysis: patient ambulated significantly under her long term goal of 1000 feet which is much less than 2211 feet, the age matched norm for a healthy female aged 83-59. This score reflects significantly limited mobility, but is an improvement from her last score of 204 feet.   5 Time Sit To Stand Test: Purpose: to assess household mobility and functional LE strength and power Score: 18 seconds from 18.5 inch plinth  with arms in front of body and no loss of balance Analysis: patient scored worse than the cut-off scores of 9-15 seconds that suggest increased fall risk, which reflects her dependence on an assistive device for safe mobility. Her score does meet her goal of 20 seconds under these conditions and is improved from her last test where she scored 18 seconds but had to stabilize herself using the back of her legs on the plinth to keep her balance.   Static balance (best of 2 trials): tandem stance, eyes open: right foot front 7 seconds, left foot front 22 seconds.    Stairs: able to ascend/descend 4 steps in the clinic with B UE support on handrails and supervision, step over step up and down with UE use.     Double leg press at seat position 1: 1RM (calculated from <10RM): 154.2#     TODAY'S TREATMENT:   Physical Performance Test or Measurement: a physical performance test or measurement with written report. 6 Minute Walk Test and 5 Times Sit To Stand Test (see result with detailed report above).   Therapeutic activities: dynamic therapeutic activities incorporating MULTIPLE  parameters or areas of the body designed to achieve improved functional performance. Seated leg press, seat position 1, to improve strength with transfers and climbing stairs:  Double leg stance: 1x15 at 35#  1 min rest Double leg stance: 1x11 at 105#  2 min rest Double leg stance: 1x7 (max) at 125# 90 second rest Double leg stance: 1x12 (max) at 105# (~70% 1RM)  Standing with B UE support on TM to improve core strength, speed of movement, hip flexion, and endurance for stepping tasks.  Blaze pod taps distraction setting with 6 pods arranged 3 around each foot (2 of them elevated on treadmill edge), tapping pods at random as fast as possible in standing at treadmill Round 1 (30 seconds): 23 hits  30 second rest Round 2 (30 seconds): 24 hits  30 second rest Round 3 (3 seconds): 19 hits   Pt required multimodal cuing  for proper technique and to facilitate improved neuromuscular control, strength, range of motion, and functional ability resulting in improved performance and form.   PATIENT EDUCATION:  Education details: Exercise purpose/form. Self management techniques. Education on diagnosis, prognosis, POC, anatomy and physiology of current condition.  Person educated: Patient Education method: Explanation, demonstration, verbal cuing, tactile cuing. Education comprehension: verbalized understanding, demonstrated understanding, and needs further education      HOME EXERCISE PROGRAM: Access Code: [email or web address] URL: [email or web address] Date: 08/20/2022 Prepared by: Norton Blizzard  Exercises - Sit to Stand Without Arm Support  - 3 x weekly - 3 sets - 5 reps - Seated Toe Raise  - 3 x weekly - 3 sets - 6 reps - 1 second hold - Supine Lower Trunk Rotation  - 3 x weekly - 1 sets - 20 reps - 1-5 seconds hold - Prone Press Up  - 3 x weekly - 2 sets - 5 reps    ASSESSMENT:   CLINICAL IMPRESSION:  Patient has attended 74 physical therapy sessions since starting current episode of care on 07/10/2021. She is returning to PT today after she was prevented form attending for  5 weeks due to her cousin/roommate being hospitalized for a severe illness which lead to patient needing to take on more household responsibilities and with more limited transportation and scheduling predictability. Patient continues to have difficulty with regular attendance of PT due to complex psychosocial circumstances, which include her vehicle breaking down and being unable to afford repairs yet. Patient reports improved participation in HEP over the last 4 weeks and has met her goal of improving 1RM by 10%. She also improved her 6 Minute Walk Test and 5 Time Sit To Stand Test some since last progress note, also likely related to her improved HEP participation. She continues to have similar limitations on climbing stairs and static  balance. FOTO goal was discontinued due to lack of continued access to questionnaire at this clinic. She did have a 2.5 point improvement on the Patient Specific Functional Scale. Patient's improvement with improved HEP participation demonstrates her ability to make progress when she is consistently engaging in even small amounts of strength training. She continues to have potential for significantly more improvement with regular PT attendance. Plan to continue with PT as outlined below. Patient continues to be educated on importance of consistent participation and appears to make good efforts to attend and participate. She does have a chronic degenerative neurologic condition that will worsen with discontinuation of physical therapy, making continued care medically necessary.  Patient would benefit from continued management of limiting condition by skilled physical  therapist to address remaining impairments and functional limitations to work towards stated goals and return to PLOF or maximal functional independence.    OBJECTIVE IMPAIRMENTS Abnormal gait, decreased activity tolerance, decreased balance, decreased coordination, decreased endurance, decreased mobility, difficulty walking, decreased ROM, decreased strength, impaired perceived functional ability, increased muscle spasms, impaired tone, impaired UE functional use, improper body mechanics, postural dysfunction, and pain.    ACTIVITY LIMITATIONS cleaning, community activity, driving, meal prep, laundry, and shopping.    PERSONAL FACTORS Behavior pattern, Fitness, Past/current experiences, Time since onset of injury/illness/exacerbation, Transportation, and 3+ comorbidities:    muscular dystrophy (FSHD - facioscapulohumeral muscular dystrophy), anxiety and depression, bipolar 1 disorder, borderline personality disorder, diabetes, GERD, gout, pancreatitis, alcoholic liver disease, tobacco abuse,  foot drop, neuropathy, recovering alcoholic, history  of drug abuse, history of hernia repair are also affecting patient's functional outcome.      REHAB POTENTIAL: Good   CLINICAL DECISION MAKING: Evolving/moderate complexity   EVALUATION COMPLEXITY: Moderate     GOALS: Goals reviewed with patient? No   SHORT TERM GOALS: Target date: 07/24/2021   Patient will be independent with initial home exercise program for self-management of symptoms. Baseline: Initial HEP to be provided at visit 2 as appropriate (07/10/21); Goal status: Met     LONG TERM GOALS: Target date: 10/02/2021; updated to 12/27/2021 for all unmet goals on 10/04/2021; updated to 03/26/2022 for all unmet goals on 01/01/2022; updated to 06/19/2022 for all unmet goals on 03/27/2022; updated to 08/14/2022 for all unmet goals on 05/22/2022, updated to 11/12/2022 for all unmet goals on 08/20/2022, updated to 01/09/2023 for all unmet goals on 10/17/2022, updated to 05/29/2023 for all unmet goals on 03/06/2023, updated to 09/10/2023 for all unmet goals on 06/18/2023.    Patient will be independent with a long-term home exercise program for self-management of symptoms.  Baseline: Initial HEP to be provided at visit 2 as appropriate (07/10/21); participating as tolerated, recently too much pain to participate (09/04/2021); participating regularly (10/04/2021); participating as able (11/21/2021); not participating regularly over last couple of weeks (01/01/2022); no participating regularly since last progress note (02/15/2022); improving participation (03/27/2022); has not last two weeks due to recent fall (05/17/22); has not been participating (08/20/2022); patient is participating in HEP 2x a week (10/17/2022); did not participate since last PT visit (01/22/2023); sporatic participation (03/06/2023); participating well (06/18/2023);  Goal status:  In-progress   2.  Patient will demonstrate improved FOTO score by 10 points from baseline to demonstrate improvement in overall condition and self-reported functional ability.   Baseline: to be measured visit 2 as appropriate (07/10/21); 46 at visit # 2 (07/17/2021); 50 at visit # 10 (09/04/2021); 53 at visit #15 (10/04/2021); 50 at visit # 20 (11/21/2021); 52 at visit # 26 (01/01/2022); 50 at visit #30 (02/15/2022); 54 at visit # 34 (03/27/22);  53 (05/17/22); 51 at visit #44 (08/20/2022); 58 at visit #50 (10/17/2022); 51 at visit #60 (03/07/2023); Goal status: DISCONTINUED 06/18/2023 (clinic no longer subscribes to [place] service)   3.  Patient will ambulate equal or greater than 1000 feet during 6 Min Walk Test using [place] or less restrictive assistive device to improve community ambulation.  Baseline: To be tested visit 2 as appropriate (07/10/21); 310 feet with RW (07/17/2021); 556 feet with rollator (09/04/2021); 600 feet with rollator (10/04/2021); 300 feet with rollator - had to stop test at 3 min due to intolerable right glute pain (11/21/2021); 420 feet with rollator and steppage gait, R > L. Stopped at 5 min due  to feeling of instability in low back to mid thoracic spine (01/01/2022); 573 feet with rollator and steppage gait, R > L. Occasional standing breaks. Low back pain increased to 3.5/10 (02/15/2022);  381 feet with rollator and steppage gait, R > L. Stopped at 4:30 min due to pain in low back and right glute (03/27/2022); 124' in 1 minute 22 sec. Had to discontinue due to R back/hip pain (05/17/2022); 200 feet with rollator, discontinued due to low back/glute pain (08/20/2022); 500 feet with rollator (10/17/2022); 204 feet with rollator and steppage gait, R > L. R foot in orthopedic shoe (03/06/2023); 300 feet with rollator and steppage gait, R > L (06/18/2023); Goal status: progressing   4.  Patient will complete 5 Time Sit To Stand test in equal or less than 20 seconds with no UE support from 18.5 inch plinth to demonstrate improved LE functional strength and power for basic mobility and improved fall risk.  Baseline: 24 seconds with B UE on 18.5 inch plinth.  (07/10/21); 20 seconds with B UE  on thighs from 18.5 inch plinth (09/04/2021); 19 seconds with B UE on thighs from 18.5 inch plinth (10/04/2021); 22 seconds with B UE on thighs from 18.5 inch plinth (11/21/2021); 20 seconds with B UE on thighs from 18.5 inch plinth (01/01/2022); 19 seconds without use of B UE support (02/15/2022); 21 seconds with no UE support from 18.5 inch plinth (03/27/2022); 20.5 seconds no UE support and 18" surface (05/17/2022); 20 seconds with no UE support from 18.5 inch plinth (08/20/2022); 18 seconds with no UE support from 18.5 inch plinth. Did lean back of legs on plinth for stability for 1-2 reps (03/06/2023); 18 seconds with B UE held in front of body and no outside support (06/18/2023);  Goal status: in progress     5.  Patient be able to ascend and descend stairs at her home mod I with use of B handrails.  Baseline: able to ascend/descend 4 steps in the clinic with B UE support on handrails and supervision - reports currently needs assistance to descend steps at home. (07/10/21); able to ascend/descend 4 steps in the clinic with B UE support on handrails and supervision, leading up and down with left. able to perform with reversed LE leads (09/04/2021); able to ascend/descend 4 steps in the clinic with B UE support on handrails and supervision, step over step with heavy UE use on the way down (10/04/2021); able to ascend/descend 4 steps in the clinic with B UE support on handrails and supervision, step over step up and step to leading with R LE with heavy UE use on the way down (11/21/2021); ascend/descends 4 steps in the clinic with B UE support on handrails and supervision, step over step up and down with heavy UE use on the way down (01/01/2022); able to ascend/descend 4 steps in the clinic with B UE support on handrails and supervision, step over step up and down with heavy UE use on the way down. leads up with right foot step to gait (02/15/2022; 03/27/22); able to ascend/descend 4 steps in the clinic with B UE support on  handrails and supervision, step over step up and down with moderately heavy UE use on the way down (08/20/2022); able to ascend/descend 4 steps in the clinic with B UE support on handrails and supervision, step over step up and down with UE use on the way down (10/17/2022; 03/06/23);  able to ascend/descend 4 steps in the clinic with B UE support on  handrails and supervision, step over step up and down (06/18/2023);  Goal status: MET   6.  Patient will demonstrate ability to perform static balance in tandem stance for equal or greater than 30 seconds on each side, to demonstrate improved balance and decreased fall risk.  Baseline: right foot front 10 seconds, left foot front 7 seconds (07/10/2021); right foot front 4 seconds, left foot front 6 seconds (09/04/2021); right foot front 9 seconds, left foot front 31 seconds (10/04/2021); right foot front 6.5 seconds, left foot front 8.5 seconds (11/21/2021); right foot front 8 seconds, left foot front 7 seconds (01/01/2022);  right foot front 9 seconds, left foot front 20 seconds (02/15/2022); right foot front 30 seconds, left foot front 45 seconds (03/27/2022); right foot front 16 seconds, left foot front 17 seconds (08/20/2022); right foot front 26 seconds, left foot front 43 seconds (10/17/2022); Right foot front 35 seconds, left foot front 7 seconds (03/06/2023); right foot front 7 seconds, left foot front 22 seconds (06/18/2023);  Goal status: MET 03/27/22, regressed 03/16/23, ongoing 06/18/2023  7.  Patient will demonstrate improvement in Patient Specific Functional Scale (PSFS) of equal or greater than 3 points to reflect clinically significant improvement in patient's most valued functional activities. Baseline: 4 (03/06/2023); 6.5/10 (06/18/2023);  Goal status: In progress   8.  Patient will demonstrate equal or greater than 10% improvement (132#) in calculated 1RM for double leg press to improve her LE strength for functional activities such as stairs and transfers.   Baseline: 120.3# (03/06/2023); 154.2# (06/18/2023);  Goal status: MET (06/18/2023)     PLAN: PT FREQUENCY: 2x/week   PT DURATION: 12 weeks   PLANNED INTERVENTIONS: Therapeutic exercises, Therapeutic activity, Neuromuscular re-education, Balance training, Gait training, Patient/Family education, Joint mobilization, Orthotic/Fit training, DME instructions, Dry Needling, Electrical stimulation, Cryotherapy, Moist heat, Splintting, Taping, and Manual therapy   PLAN FOR NEXT SESSION: progressive LE/functional strength/balance/muscular endurance as tolerated. Manual therapy for pain control as needed.   [name]. Ilsa Iha, PT, DPT 06/18/23, 7:52 PM  [facility] [address] [place], [place] [address] P: [phone] I F: [phone]

## 2023-06-20 ENCOUNTER — Ambulatory Visit: Payer: 59 | Admitting: Physical Therapy

## 2023-06-25 ENCOUNTER — Ambulatory Visit: Payer: 59 | Admitting: Physical Therapy

## 2023-06-25 IMAGING — MR MR ABDOMEN WO/W CM
18 series · 48 of 48 positions shown · IV contrast (10ml Gadavist)
Comparison: Comparison made with abdominal sonogram which was
performed on [DATE] and hepatic Doppler performed on
December 20, 2020.

CLINICAL DATA: A 45-year-old female presents with jaundice and
suspected hepatic encephalopathy.

EXAM:
MRI ABDOMEN WITHOUT AND WITH CONTRAST
TECHNIQUE: Multiplanar multisequence MR imaging of the abdomen was performed
both before and after the administration of intravenous contrast.
CONTRAST:  10mL GADAVIST GADOBUTROL 1 MMOL/ML IV SOLN

[Series 2: T2 · coronal · 6.0mm · 1.19mm/px · 2 of 32 slices shown (1 of 2)]
[im 1/32]
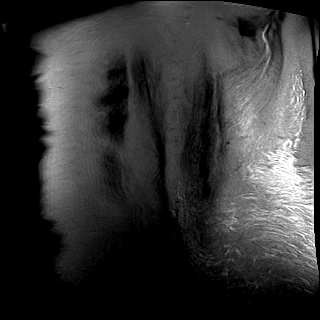
[im 32/32]
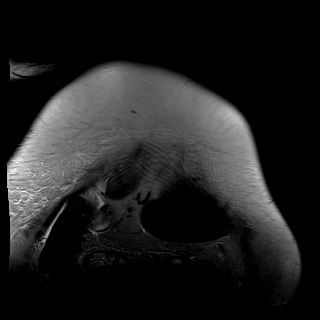

[Series 3: T2 · axial · 6.0mm · 1.19mm/px · 1 of 46 slices shown (2 of 2)]
[im 1/46]
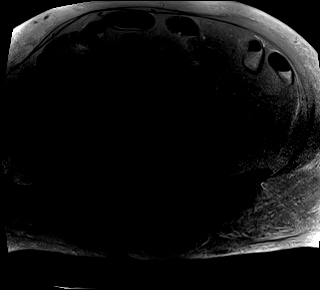

[Series 5: T2 fat-sat · axial · 6.0mm · 1.19mm/px · 1 of 46 slices shown]
[im 1/46]
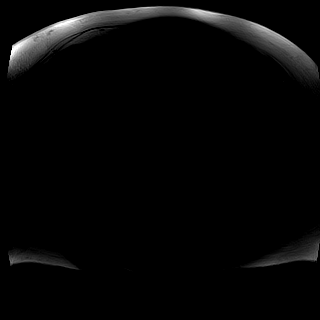

[Series 6: in & out · axial · 3.0mm · 1.19mm/px · z∈[-232,+100]mm · 4 of 112 slices shown (1 of 2)]
[im 1/112]
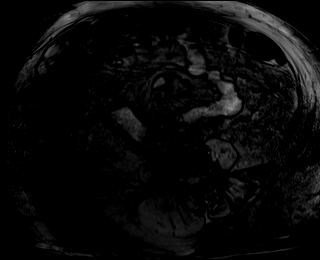
[im 38/112]
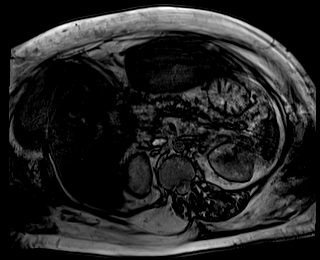
[im 75/112]
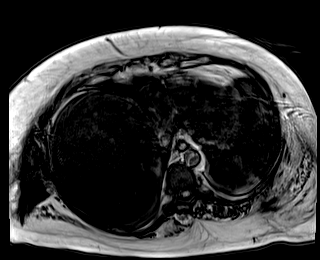
[im 112/112]
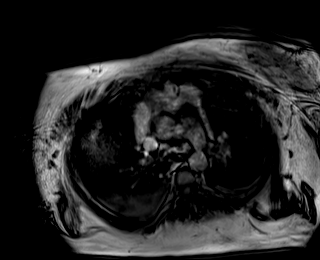

[Series 7: in & out · axial · 3.0mm · 1.19mm/px · z∈[-232,+100]mm · 4 of 112 slices shown (2 of 2)]
[im 1/112]
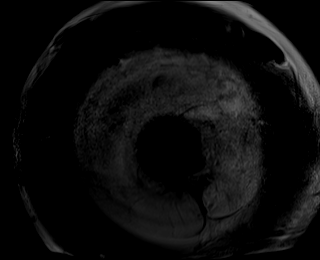
[im 38/112]
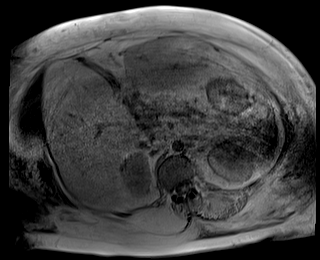
[im 75/112]
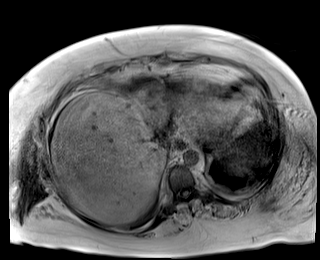
[im 112/112]
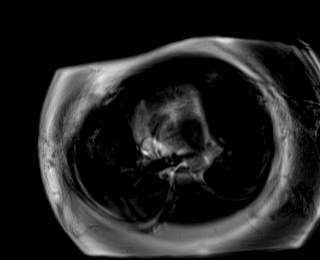

[Series 8: bSSFP · axial · 6.0mm · 0.74mm/px · 1 of 46 slices shown]
[im 1/46]
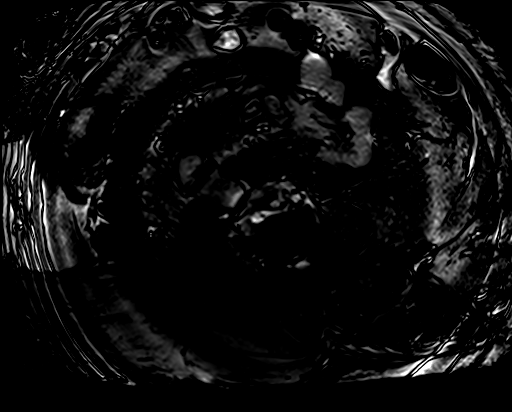

[Series 9: ax dwi_tracew · axial · 6.0mm · 1.42mm/px · z∈[-223,+101]mm · 4 of 138 slices shown]
[im 1/138]
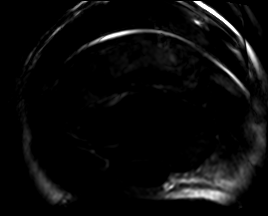
[im 46/138]
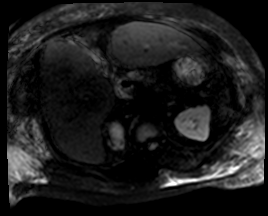
[im 92/138]
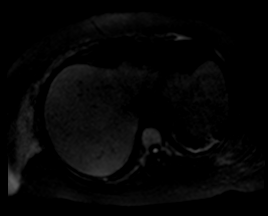
[im 138/138]
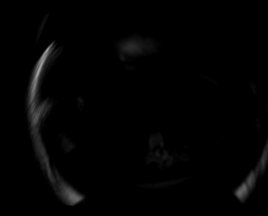

[Series 10: ax dwi_adc · axial · 6.0mm · 1.42mm/px · 1 of 46 slices shown]
[im 1/46]
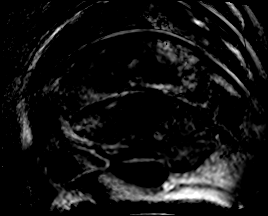

[Series 11: T1 dynamic fat-sat · axial · non-contrast · 3.5mm · 1.19mm/px · z∈[-225,+107]mm · 3 of 96 slices shown (1 of 5)]
[im 1/96]
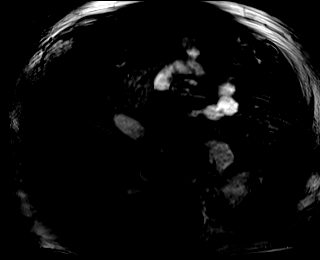
[im 48/96]
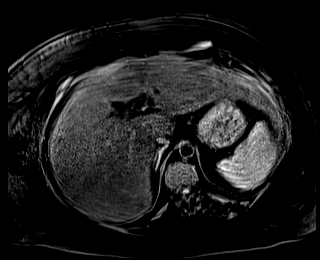
[im 96/96]
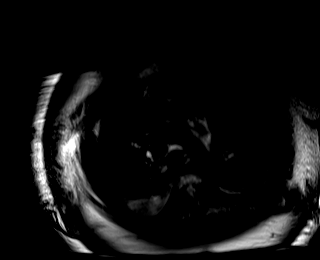

[Series 12: T1 dynamic fat-sat post-contrast · axial · 3.5mm · 1.19mm/px · z∈[-225,+107]mm · 3 of 96 slices shown (1 of 4)]
[im 1/96]
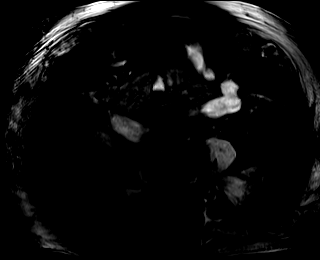
[im 48/96]
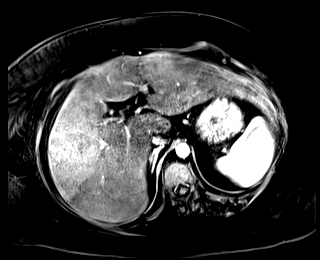
[im 96/96]
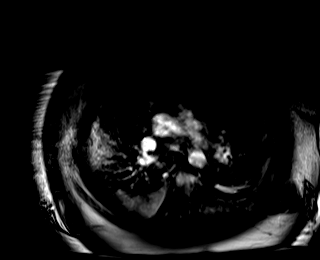

[Series 13: T1 dynamic fat-sat · axial · 3.5mm · 1.19mm/px · z∈[-225,+107]mm · 3 of 96 slices shown (2 of 5)]
[im 1/96]
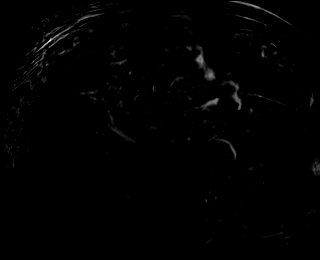
[im 48/96]
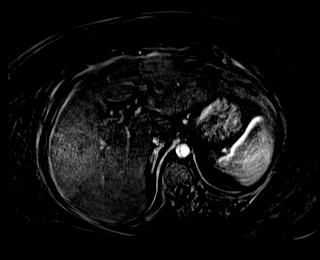
[im 96/96]
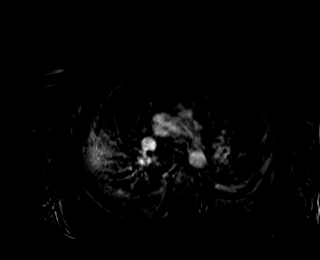

[Series 14: T1 dynamic fat-sat post-contrast · axial · 3.5mm · 1.19mm/px · z∈[-225,+107]mm · 3 of 96 slices shown (2 of 4)]
[im 1/96]
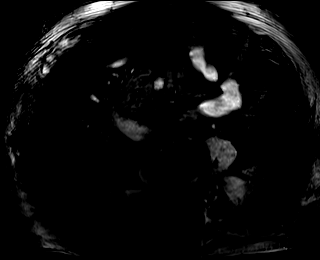
[im 48/96]
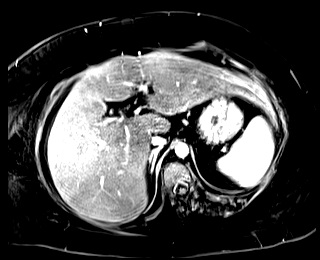
[im 96/96]
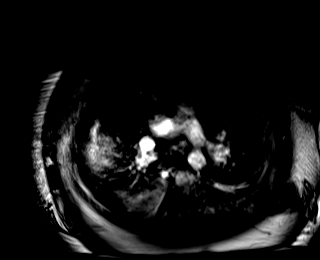

[Series 15: T1 dynamic fat-sat · axial · 3.5mm · 1.19mm/px · z∈[-225,+107]mm · 3 of 96 slices shown (3 of 5)]
[im 1/96]
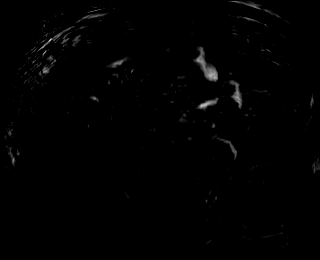
[im 48/96]
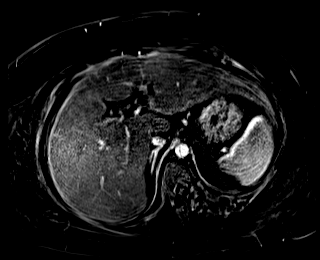
[im 96/96]
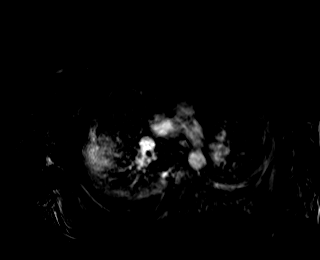

[Series 16: T1 dynamic fat-sat post-contrast · axial · 3.5mm · 1.19mm/px · z∈[-225,+107]mm · 3 of 96 slices shown (3 of 4)]
[im 1/96]
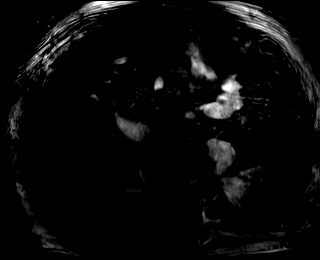
[im 48/96]
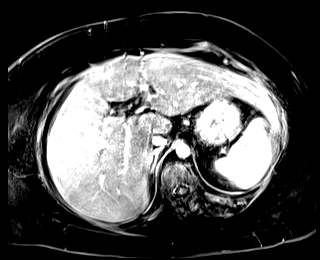
[im 96/96]
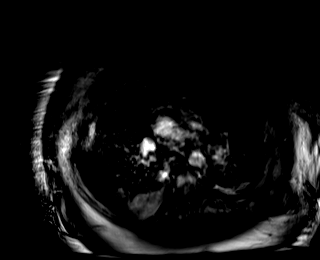

[Series 17: T1 dynamic fat-sat · axial · 3.5mm · 1.19mm/px · z∈[-225,+107]mm · 3 of 96 slices shown (4 of 5)]
[im 1/96]
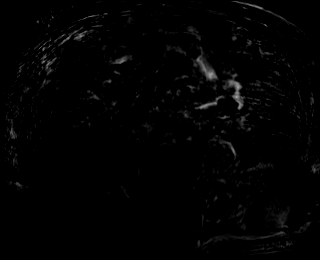
[im 48/96]
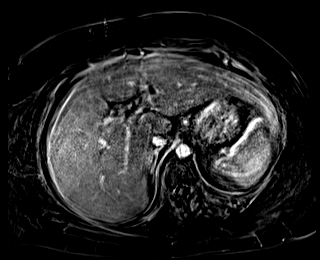
[im 96/96]
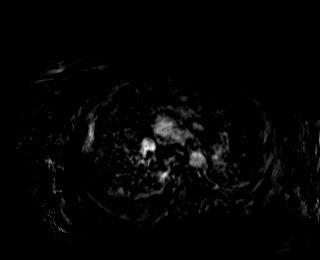

[Series 18: T1 dynamic post-contrast · coronal · 3.0mm · 1.31mm/px · 3 of 80 slices shown]
[im 1/80]
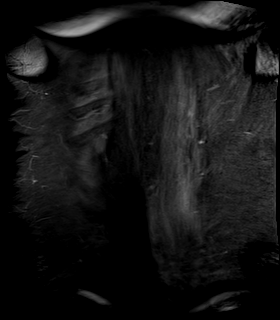
[im 40/80]
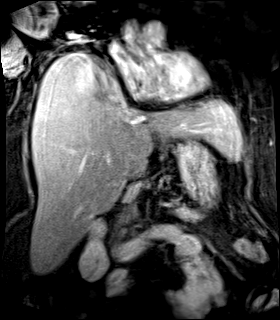
[im 80/80]
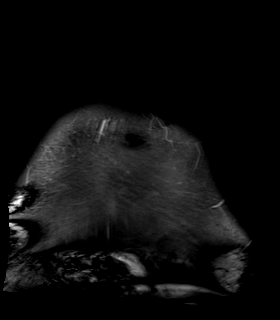

[Series 19: T1 dynamic fat-sat post-contrast · axial · 3.5mm · 1.19mm/px · z∈[-225,+107]mm · 3 of 96 slices shown (4 of 4)]
[im 1/96]
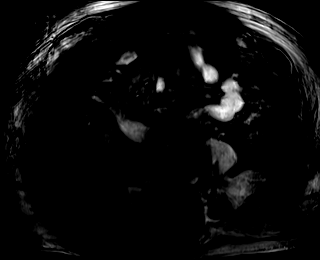
[im 48/96]
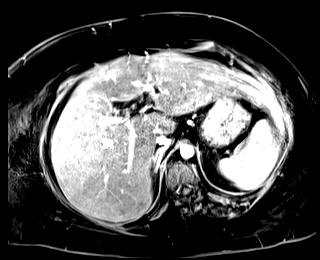
[im 96/96]
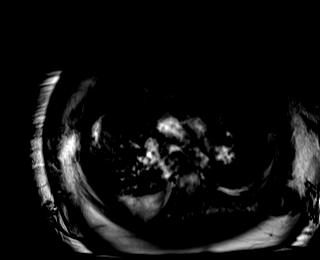

[Series 20: T1 dynamic fat-sat · axial · 3.5mm · 1.19mm/px · z∈[-225,+107]mm · 3 of 96 slices shown (5 of 5)]
[im 1/96]
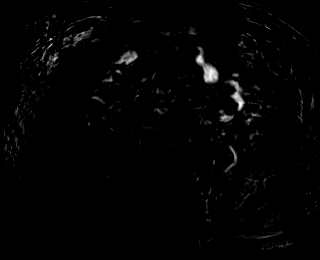
[im 48/96]
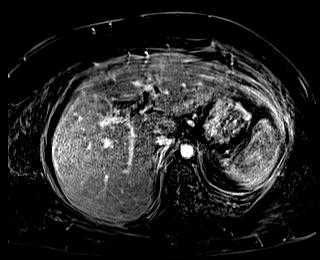
[im 96/96]
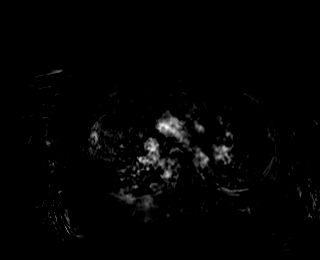

[48 of 48 positions shown; findings below may reference images not displayed]

FINDINGS: Lower chest: RIGHT effusion and basilar airspace disease, not well
assessed in the setting of elevation of the RIGHT hemidiaphragm
likely at least partially secondary to marked hepatomegaly. Small
LEFT effusion. Lung base assessment is limited on MRI.

Hepatobiliary: Marked hepatomegaly and marked hepatic steatosis.
Dedicated MRCP images were not performed though there is no sign of
intra or extrahepatic biliary duct distension. Gallbladder is
collapsed.

Portal and hepatic veins are patent.

Pancreas: Normal intrinsic T1 signal. No ductal dilation or sign of
inflammation. No focal lesion.

Spleen:  Spleen normal in size and contour.

Adrenals/Urinary Tract: Adrenal glands are normal. No suspicious
renal lesion or hydronephrosis. No perinephric stranding.

Stomach/Bowel: Gastrointestinal tract shows no acute process on
limited assessment to the extent evaluated on abdominal MRI.

Vascular/Lymphatic: No pathologically enlarged lymph nodes
identified. No abdominal aortic aneurysm demonstrated.

Other:  Small volume ascites.

Musculoskeletal: No suspicious bone lesions identified. Marked
muscular atrophy in this patient with history of muscular dystrophy.
IMPRESSION: Marked hepatomegaly and marked hepatic steatosis. Lobular hepatic
contours and marked hepatomegaly with liver span exceeding 28 cm. In
the setting of ascites would correlate with any signs of
steatohepatitis. No current evidence of portal hypertension.

No acute biliary process. Note the dedicated MRCP images were not
performed though there is no biliary duct dilation and the
gallbladder is completely collapsed.

No biliary duct distension.

Basilar effusions and bibasilar airspace disease RIGHT greater than
LEFT.

Cardiomegaly not well assessed.

## 2023-06-27 ENCOUNTER — Ambulatory Visit: Payer: 59 | Admitting: Physical Therapy

## 2023-07-01 ENCOUNTER — Encounter: Payer: Self-pay | Admitting: Physical Therapy

## 2023-07-01 ENCOUNTER — Ambulatory Visit: Payer: 59 | Admitting: Physical Therapy

## 2023-07-01 DIAGNOSIS — M6281 Muscle weakness (generalized): Secondary | ICD-10-CM | POA: Diagnosis not present

## 2023-07-01 DIAGNOSIS — Z9181 History of falling: Secondary | ICD-10-CM

## 2023-07-01 DIAGNOSIS — R2689 Other abnormalities of gait and mobility: Secondary | ICD-10-CM

## 2023-07-01 NOTE — Therapy (Signed)
 OUTPATIENT PHYSICAL THERAPY TREATMENT    Patient Name: [name] MRN: [id number] DOB:[birth year], 48 y.o., female Today's Date: 07/01/2023  PCP: Eunice Blase, PA-C REFERRING PROVIDER: Eunice Blase, PA-C  END OF SESSION:   PT End of Session - 07/01/23 1442     Visit Number 68    Number of Visits 90    Date for PT Re-Evaluation 09/10/23    Authorization Type [employer] MEDICARE/Medicaid reporting period from 06/18/2023    Progress Note Due on Visit 70    PT Start Time 1436    PT Stop Time 1514    PT Time Calculation (min) 38 min    Activity Tolerance Patient tolerated treatment well;Patient limited by fatigue    Behavior During Therapy WFL for tasks assessed/performed              Past Medical History:  Diagnosis Date   Alcohol abuse    Anxiety    Anxiety    Anxiety and depression    Asthma    Bipolar disorder ([name])    Borderline personality disorder ([name])    Depression    Diabetes ([name])    Type 2    Dyslipidemia    Esophageal varices ([name])    Foot drop    right   [name] ([name])    GERD (gastroesophageal reflux disease)    Gout    History of drug abuse ([name])    Insomnia    Muscular dystrophies ([name])    Neuropathy    Pancreatitis    Pneumonia    Recovering alcoholic ([name])    Past Surgical History:  Procedure Laterality Date   ESOPHAGOGASTRODUODENOSCOPY N/A 04/25/2022   Procedure: ESOPHAGOGASTRODUODENOSCOPY (EGD);  Surgeon: [name], MD;  Location[facility] ENDOSCOPY;  Service: Gastroenterology;  Laterality: N/A;   HERNIA REPAIR     IR FLUORO GUIDE CV LINE RIGHT  12/30/2020   IR REMOVAL TUN CV CATH W/O FL  01/19/2021   IR US GUIDE VASC ACCESS RIGHT  12/30/2020   Patient Active Problem List   Diagnosis Date Noted   Gait abnormality 05/24/2022   Right hand weakness 05/24/2022   Frequent falls 02/12/2021   Leukocytosis    Aspiration pneumonia of both lower lobes due to gastric secretions ([name]) 01/04/2021   Pressure injury  of skin 12/24/2020   Acute hepatic encephalopathy ([name]) 12/20/2020   Sepsis ([name]) 12/19/2020   Acute hyponatremia 12/18/2020   Alcohol use disorder, moderate, dependence ([name]) 12/18/2020   Hepatic steatosis 12/18/2020   Polysubstance abuse ([name]) 12/18/2020   Hyperglycemia due to type 2 diabetes mellitus ([name]) 12/18/2020   Hyperbilirubinemia 12/18/2020   Lactic acidosis 12/18/2020   Alcoholic liver disease ([name]) 12/18/2020   Bipolar 1 disorder, manic, moderate ([name]) 09/10/2018   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   Diabetes ([name]) 09/10/2018   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   Chronic gout of foot 09/29/2015   Muscular dystrophy ([name]) 09/29/2015   Type 2 diabetes mellitus without complication ([name]) 09/29/2015   Anxiety and depression 10/01/2012   Protein-calorie malnutrition, severe ([name]) 09/30/2012   Trichomonas infection 09/26/2012   Dehydration 09/26/2012   Hypokalemia 11/14/2011   Thrombocytopenia ([name]) 11/14/2011   Bipolar 2 disorder ([name]) 11/13/2011   Gout 11/13/2011     REFERRING DIAG: bilateral leg weakness  THERAPY DIAG:  Other abnormalities of gait and mobility  Muscle weakness (generalized)  History of falling  Rationale for Evaluation and Treatment Rehabilitation  PERTINENT HISTORY: Patient is a 48 y.o. female who presents to  outpatient physical therapy with a referral for medical diagnosis bilateral leg weakness. This patient's chief complaints consist of difficulty with weakness and balance with history of frequent falling leading to the following functional deficits: difficulty with basic mobility including ambulation, stairs, transfers; difficulty with ADLs, IADLs. Currently requires assistance to navigate stairs, assistance with ADLs and  IADLs, and ambulates with RW when she previously did not need an assistive device. Relevant past medical history and comorbidities include muscular dystrophy (FSHD - facioscapulohumeral muscular  dystrophy), anxiety and depression, bipolar 1 disorder, borderline personality disorder, diabetes, GERD, gout, pancreatitis, alcoholic liver disease, tobacco abuse,  foot drop, neuropathy, recovering alcoholic, history of drug abuse, history of hernia repair.  Patient denies hx of cancer, stroke, seizures, lung problems, heart problems, unexplained weight loss, unexplained changes in bowel or bladder problems, unexplained stumbling or dropping things, osteoporosis, and spinal surgery.   PRECAUTIONS: Fall, not drink alcohol.  SUBJECTIVE:  Patient states she was really sore throughout both hips for several days and has done her HEP 0 times since last PT session. She is feeling pretty good today. She states she missed her last PT session due to ligtheadedness, so she did not take her blood pressure meds today. She smoked a whole pack of cigarettes this week which is more than the 1-2 cigarettes per 1-2 weeks that she usually smokes.   PAIN: NPRS 2/10 right glute  OBJECTIVE   Double leg press at seat position 1: 1RM (calculated from <10RM): 154.2# (last tested 06/18/2023)  TODAY'S TREATMENT:    Therapeutic activities: dynamic therapeutic activities incorporating MULTIPLE parameters or areas of the body designed to achieve improved functional performance. Seated leg press, seat position 1, to improve strength with transfers and climbing stairs:  Double leg stance: 1x15 at 75# (RPE 7/10) 1 min rest Double leg stance: 1x15 at 80#  (RPE 7/10) 1 min rest Double leg stance: 1x15 at 85# (RPE 8/10)  Standing with B UE support on TM to improve core strength, speed of movement, hip flexion, and endurance for stepping tasks.  Blaze pod taps distraction setting with 6 pods arranged 3 around each foot (2 of them elevated on treadmill edge), tapping pods at random as fast as possible in standing at treadmill, B UE support (attempting to lessen UE support) Round 1 (60 seconds): 39 hits  30 second rest Round 2  (60 seconds): 44 hits  30 second rest Round 3 (60 seconds): 42 hits   Sled (75#) push with no added weight 2x25 feet 1 min rest 2x25 feet 1 min rest 2x25 feet 1 min rest PT assistance for turning sled  Pt required multimodal cuing for proper technique and to facilitate improved neuromuscular control, strength, range of motion, and functional ability resulting in improved performance and form.   PATIENT EDUCATION:  Education details: Exercise purpose/form. Self management techniques. Education on diagnosis, prognosis, POC, anatomy and physiology of current condition.  Person educated: Patient Education method: Explanation, demonstration, verbal cuing, tactile cuing. Education comprehension: verbalized understanding, demonstrated understanding, and needs further education      HOME EXERCISE PROGRAM: Access Code: [email or web address] URL: [email or web address] Date: 08/20/2022 Prepared by: Alleen Isle  Exercises - Sit to Stand Without Arm Support  - 3 x weekly - 3 sets - 5 reps - Seated Toe Raise  - 3 x weekly - 3 sets - 6 reps - 1 second hold - Supine Lower Trunk Rotation  - 3 x weekly - 1 sets - 20 reps - 1-5 seconds hold - Prone  Press Up  - 3 x weekly - 2 sets - 5 reps    ASSESSMENT:   CLINICAL IMPRESSION:  Patient was sore in her hips following last PT session, so decreased intensity slightly this session. Patient continued working on exercises for improved functional activity tolerance, LE strength, and coordination. She continues to fatigue quickly and require rest breaks. No problems with lightheadedness today. Patient would benefit from continued management of limiting condition by skilled physical therapist to address remaining impairments and functional limitations to work towards stated goals and return to PLOF or maximal functional independence.   OBJECTIVE IMPAIRMENTS Abnormal gait, decreased activity tolerance, decreased balance, decreased coordination, decreased  endurance, decreased mobility, difficulty walking, decreased ROM, decreased strength, impaired perceived functional ability, increased muscle spasms, impaired tone, impaired UE functional use, improper body mechanics, postural dysfunction, and pain.    ACTIVITY LIMITATIONS cleaning, community activity, driving, meal prep, laundry, and shopping.    PERSONAL FACTORS Behavior pattern, Fitness, Past/current experiences, Time since onset of injury/illness/exacerbation, Transportation, and 3+ comorbidities:    muscular dystrophy (FSHD - facioscapulohumeral muscular dystrophy), anxiety and depression, bipolar 1 disorder, borderline personality disorder, diabetes, GERD, gout, pancreatitis, alcoholic liver disease, tobacco abuse,  foot drop, neuropathy, recovering alcoholic, history of drug abuse, history of hernia repair are also affecting patient's functional outcome.      REHAB POTENTIAL: Good   CLINICAL DECISION MAKING: Evolving/moderate complexity   EVALUATION COMPLEXITY: Moderate     GOALS: Goals reviewed with patient? No   SHORT TERM GOALS: Target date: 07/24/2021   Patient will be independent with initial home exercise program for self-management of symptoms. Baseline: Initial HEP to be provided at visit 2 as appropriate (07/10/21); Goal status: Met     LONG TERM GOALS: Target date: 10/02/2021; updated to 12/27/2021 for all unmet goals on 10/04/2021; updated to 03/26/2022 for all unmet goals on 01/01/2022; updated to 06/19/2022 for all unmet goals on 03/27/2022; updated to 08/14/2022 for all unmet goals on 05/22/2022, updated to 11/12/2022 for all unmet goals on 08/20/2022, updated to 01/09/2023 for all unmet goals on 10/17/2022, updated to 05/29/2023 for all unmet goals on 03/06/2023, updated to 09/10/2023 for all unmet goals on 06/18/2023.    Patient will be independent with a long-term home exercise program for self-management of symptoms.  Baseline: Initial HEP to be provided at visit 2 as appropriate  (07/10/21); participating as tolerated, recently too much pain to participate (09/04/2021); participating regularly (10/04/2021); participating as able (11/21/2021); not participating regularly over last couple of weeks (01/01/2022); no participating regularly since last progress note (02/15/2022); improving participation (03/27/2022); has not last two weeks due to recent fall (05/17/22); has not been participating (08/20/2022); patient is participating in HEP 2x a week (10/17/2022); did not participate since last PT visit (01/22/2023); sporatic participation (03/06/2023); participating well (06/18/2023);  Goal status:  In-progress   2.  Patient will demonstrate improved FOTO score by 10 points from baseline to demonstrate improvement in overall condition and self-reported functional ability.  Baseline: to be measured visit 2 as appropriate (07/10/21); 46 at visit # 2 (07/17/2021); 50 at visit # 10 (09/04/2021); 53 at visit #15 (10/04/2021); 50 at visit # 20 (11/21/2021); 52 at visit # 26 (01/01/2022); 50 at visit #30 (02/15/2022); 54 at visit # 34 (03/27/22);  53 (05/17/22); 51 at visit #44 (08/20/2022); 58 at visit #50 (10/17/2022); 51 at visit #60 (03/07/2023); Goal status: DISCONTINUED 06/18/2023 (clinic no longer subscribes to [facility] service)   3.  Patient will ambulate equal or greater than  1000 feet during 6 Min Walk Test using [facility] or less restrictive assistive device to improve community ambulation.  Baseline: To be tested visit 2 as appropriate (07/10/21); 310 feet with RW (07/17/2021); 556 feet with rollator (09/04/2021); 600 feet with rollator (10/04/2021); 300 feet with rollator - had to stop test at 3 min due to intolerable right glute pain (11/21/2021); 420 feet with rollator and steppage gait, R > L. Stopped at 5 min due to feeling of instability in low back to mid thoracic spine (01/01/2022); 573 feet with rollator and steppage gait, R > L. Occasional standing breaks. Low back pain increased to 3.5/10 (02/15/2022);  381 feet  with rollator and steppage gait, R > L. Stopped at 4:30 min due to pain in low back and right glute (03/27/2022); 124' in 1 minute 22 sec. Had to discontinue due to R back/hip pain (05/17/2022); 200 feet with rollator, discontinued due to low back/glute pain (08/20/2022); 500 feet with rollator (10/17/2022); 204 feet with rollator and steppage gait, R > L. R foot in orthopedic shoe (03/06/2023); 300 feet with rollator and steppage gait, R > L (06/18/2023); Goal status: progressing   4.  Patient will complete 5 Time Sit To Stand test in equal or less than 20 seconds with no UE support from 18.5 inch plinth to demonstrate improved LE functional strength and power for basic mobility and improved fall risk.  Baseline: 24 seconds with B UE on 18.5 inch plinth.  (07/10/21); 20 seconds with B UE on thighs from 18.5 inch plinth (09/04/2021); 19 seconds with B UE on thighs from 18.5 inch plinth (10/04/2021); 22 seconds with B UE on thighs from 18.5 inch plinth (11/21/2021); 20 seconds with B UE on thighs from 18.5 inch plinth (01/01/2022); 19 seconds without use of B UE support (02/15/2022); 21 seconds with no UE support from 18.5 inch plinth (03/27/2022); 20.5 seconds no UE support and 18" surface (05/17/2022); 20 seconds with no UE support from 18.5 inch plinth (08/20/2022); 18 seconds with no UE support from 18.5 inch plinth. Did lean back of legs on plinth for stability for 1-2 reps (03/06/2023); 18 seconds with B UE held in front of body and no outside support (06/18/2023);  Goal status: in progress     5.  Patient be able to ascend and descend stairs at her home mod I with use of B handrails.  Baseline: able to ascend/descend 4 steps in the clinic with B UE support on handrails and supervision - reports currently needs assistance to descend steps at home. (07/10/21); able to ascend/descend 4 steps in the clinic with B UE support on handrails and supervision, leading up and down with left. able to perform with reversed LE leads  (09/04/2021); able to ascend/descend 4 steps in the clinic with B UE support on handrails and supervision, step over step with heavy UE use on the way down (10/04/2021); able to ascend/descend 4 steps in the clinic with B UE support on handrails and supervision, step over step up and step to leading with R LE with heavy UE use on the way down (11/21/2021); ascend/descends 4 steps in the clinic with B UE support on handrails and supervision, step over step up and down with heavy UE use on the way down (01/01/2022); able to ascend/descend 4 steps in the clinic with B UE support on handrails and supervision, step over step up and down with heavy UE use on the way down. leads up with right foot step to gait (02/15/2022; 03/27/22);  able to ascend/descend 4 steps in the clinic with B UE support on handrails and supervision, step over step up and down with moderately heavy UE use on the way down (08/20/2022); able to ascend/descend 4 steps in the clinic with B UE support on handrails and supervision, step over step up and down with UE use on the way down (10/17/2022; 03/06/23);  able to ascend/descend 4 steps in the clinic with B UE support on handrails and supervision, step over step up and down (06/18/2023);  Goal status: MET   6.  Patient will demonstrate ability to perform static balance in tandem stance for equal or greater than 30 seconds on each side, to demonstrate improved balance and decreased fall risk.  Baseline: right foot front 10 seconds, left foot front 7 seconds (07/10/2021); right foot front 4 seconds, left foot front 6 seconds (09/04/2021); right foot front 9 seconds, left foot front 31 seconds (10/04/2021); right foot front 6.5 seconds, left foot front 8.5 seconds (11/21/2021); right foot front 8 seconds, left foot front 7 seconds (01/01/2022);  right foot front 9 seconds, left foot front 20 seconds (02/15/2022); right foot front 30 seconds, left foot front 45 seconds (03/27/2022); right foot front 16 seconds, left  foot front 17 seconds (08/20/2022); right foot front 26 seconds, left foot front 43 seconds (10/17/2022); Right foot front 35 seconds, left foot front 7 seconds (03/06/2023); right foot front 7 seconds, left foot front 22 seconds (06/18/2023);  Goal status: MET 03/27/22, regressed 03/16/23, ongoing 06/18/2023  7.  Patient will demonstrate improvement in Patient Specific Functional Scale (PSFS) of equal or greater than 3 points to reflect clinically significant improvement in patient's most valued functional activities. Baseline: 4 (03/06/2023); 6.5/10 (06/18/2023);  Goal status: In progress   8.  Patient will demonstrate equal or greater than 10% improvement (132#) in calculated 1RM for double leg press to improve her LE strength for functional activities such as stairs and transfers.  Baseline: 120.3# (03/06/2023); 154.2# (06/18/2023);  Goal status: MET (06/18/2023)     PLAN: PT FREQUENCY: 2x/week   PT DURATION: 12 weeks   PLANNED INTERVENTIONS: Therapeutic exercises, Therapeutic activity, Neuromuscular re-education, Balance training, Gait training, Patient/Family education, Joint mobilization, Orthotic/Fit training, DME instructions, Dry Needling, Electrical stimulation, Cryotherapy, Moist heat, Splintting, Taping, and Manual therapy   PLAN FOR NEXT SESSION: progressive LE/functional strength/balance/muscular endurance as tolerated. Manual therapy for pain control as needed.   Carilyn Charles. Artemio Larry, PT, DPT 07/01/23, 3:14 PM  [facility] [address] [place], [place] [address] P: [phone] I F: [phone]

## 2023-07-02 ENCOUNTER — Encounter: Admitting: Physical Therapy

## 2023-07-03 ENCOUNTER — Ambulatory Visit: Payer: 59 | Admitting: Physical Therapy

## 2023-07-08 ENCOUNTER — Ambulatory Visit: Payer: 59 | Admitting: Physical Therapy

## 2023-07-11 ENCOUNTER — Ambulatory Visit: Payer: 59 | Admitting: Physical Therapy

## 2023-07-16 ENCOUNTER — Telehealth: Payer: Self-pay | Admitting: Physical Therapy

## 2023-07-16 NOTE — Telephone Encounter (Signed)
 Returned call as requested. No answer, so left VM letting her know I called and will hopefully get in touch with her tomorrow.   Carilyn Charles. Artemio Larry, PT, DPT 07/16/23, 7:24 PM  [facility] [address] [place], [place] [address] P: [phone] I F: [phone]

## 2023-07-17 ENCOUNTER — Encounter: Payer: Self-pay | Admitting: Physical Therapy

## 2023-07-17 ENCOUNTER — Ambulatory Visit: Admitting: Physical Therapy

## 2023-07-17 DIAGNOSIS — Z9181 History of falling: Secondary | ICD-10-CM

## 2023-07-17 DIAGNOSIS — M6281 Muscle weakness (generalized): Secondary | ICD-10-CM

## 2023-07-17 DIAGNOSIS — R2689 Other abnormalities of gait and mobility: Secondary | ICD-10-CM | POA: Diagnosis not present

## 2023-07-17 NOTE — Therapy (Signed)
 OUTPATIENT PHYSICAL THERAPY TREATMENT    Patient Name: [name] MRN: [id number] DOB:12-29-1975, 48 y.o., female Today's Date: 07/17/2023  PCP: Clydene Darner, PA-C REFERRING PROVIDER: Clydene Darner, PA-C  END OF SESSION:   PT End of Session - 07/17/23 0953     Visit Number 69    Number of Visits 90    Date for PT Re-Evaluation 09/10/23    Authorization Type [employer] MEDICARE/Medicaid reporting period from 06/18/2023    Progress Note Due on Visit 70    PT Start Time 0952    PT Stop Time 1030    PT Time Calculation (min) 38 min    Activity Tolerance Patient tolerated treatment well;Patient limited by fatigue    Behavior During Therapy [facility] for tasks assessed/performed              Past Medical History:  Diagnosis Date   Alcohol abuse    Anxiety    Anxiety    Anxiety and depression    Asthma    Bipolar disorder ([name])    Borderline personality disorder ([name])    Depression    Diabetes ([name])    Type 2    Dyslipidemia    Esophageal varices ([name])    Foot drop    right   [name] ([name])    GERD (gastroesophageal reflux disease)    Gout    History of drug abuse ([name])    Insomnia    Muscular dystrophies ([name])    Neuropathy    Pancreatitis    Pneumonia    Recovering alcoholic ([name])    Past Surgical History:  Procedure Laterality Date   ESOPHAGOGASTRODUODENOSCOPY N/A 04/25/2022   Procedure: ESOPHAGOGASTRODUODENOSCOPY (EGD);  Surgeon: Luke Salaam, MD;  Location: [facility] ENDOSCOPY;  Service: Gastroenterology;  Laterality: N/A;   HERNIA REPAIR     IR FLUORO GUIDE CV LINE RIGHT  12/30/2020   IR REMOVAL TUN CV CATH W/O FL  01/19/2021   IR US  GUIDE VASC ACCESS RIGHT  12/30/2020   Patient Active Problem List   Diagnosis Date Noted   Gait abnormality 05/24/2022   Right hand weakness 05/24/2022   Frequent falls 02/12/2021   Leukocytosis    Aspiration pneumonia of both lower lobes due to gastric secretions ([name]) 01/04/2021   Pressure injury  of skin 12/24/2020   Acute hepatic encephalopathy ([name]) 12/20/2020   Sepsis ([name]) 12/19/2020   Acute hyponatremia 12/18/2020   Alcohol use disorder, moderate, dependence ([name]) 12/18/2020   Hepatic steatosis 12/18/2020   Polysubstance abuse ([name]) 12/18/2020   Hyperglycemia due to type 2 diabetes mellitus ([name]) 12/18/2020   Hyperbilirubinemia 12/18/2020   Lactic acidosis 12/18/2020   Alcoholic liver disease ([name]) 12/18/2020   Bipolar 1 disorder, manic, moderate ([name]) 09/10/2018   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   Diabetes ([name]) 09/10/2018   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   Chronic gout of foot 09/29/2015   Muscular dystrophy ([name]) 09/29/2015   Type 2 diabetes mellitus without complication ([name]) 09/29/2015   Anxiety and depression 10/01/2012   Protein-calorie malnutrition, severe ([name]) 09/30/2012   Trichomonas infection 09/26/2012   Dehydration 09/26/2012   Hypokalemia 11/14/2011   Thrombocytopenia ([name]) 11/14/2011   Bipolar 2 disorder ([name]) 11/13/2011   Gout 11/13/2011     REFERRING DIAG: bilateral leg weakness  THERAPY DIAG:  Other abnormalities of gait and mobility  Muscle weakness (generalized)  History of falling  Rationale for Evaluation and Treatment Rehabilitation  PERTINENT HISTORY: Patient is a 48 y.o. female who presents to  outpatient physical therapy with a referral for medical diagnosis bilateral leg weakness. This patient's chief complaints consist of difficulty with weakness and balance with history of frequent falling leading to the following functional deficits: difficulty with basic mobility including ambulation, stairs, transfers; difficulty with ADLs, IADLs. Currently requires assistance to navigate stairs, assistance with ADLs and  IADLs, and ambulates with RW when she previously did not need an assistive device. Relevant past medical history and comorbidities include muscular dystrophy (FSHD - facioscapulohumeral muscular  dystrophy), anxiety and depression, bipolar 1 disorder, borderline personality disorder, diabetes, GERD, gout, pancreatitis, alcoholic liver disease, tobacco abuse,  foot drop, neuropathy, recovering alcoholic, history of drug abuse, history of hernia repair.  Patient denies hx of cancer, stroke, seizures, lung problems, heart problems, unexplained weight loss, unexplained changes in bowel or bladder problems, unexplained stumbling or dropping things, osteoporosis, and spinal surgery.   PRECAUTIONS: Fall, not drink alcohol.  SUBJECTIVE:  Patient states she moved furniture yesterday and her back and right glute is hurting more than usual. She was a little tired after last PT session, but not too tired. She missed PT last week because her cousin/roommate was in the hospital last week.   PAIN: NPRS 6.75/10 right glute and low back  OBJECTIVE   Double leg press at seat position 1: 1RM (calculated from <10RM): 154.2# (last tested 06/18/2023)  TODAY'S TREATMENT:    Therapeutic activities: dynamic therapeutic activities incorporating MULTIPLE parameters or areas of the body designed to achieve improved functional performance. Seated leg press, seat position 1, to improve strength with transfers and climbing stairs:  Double leg stance: 1x15 at 75#  1 min rest Double leg stance: 1x15 at 85#  1 min rest Double leg stance: 1x15 at 85#  1 min rest Double leg stance: 1x15 at 85# (RPE 7-8/10)  Therapeutic exercise: therapeutic exercises that incorporate ONE parameter at one or more areas of the body to centralize symptoms, develop strength and endurance, range of motion, and flexibility required for successful completion of functional activities.  Standing hip hikes off 2x4 board with U UE support Attempted but unable to perform safely and correctly  Standing hip abduction against YTB around ankles 1x10 with L LE Unable to perform without severe trendelenburg (L hip weakness) on R so discontinued    Manual therapy: to reduce pain and tissue tension, improve range of motion, neuromodulation, in order to promote improved ability to complete functional activities. PRONE CPA to lower thoracic through lumbar spine, grade III-IV STM to lumbar paraspinals and right glute   Pt required multimodal cuing for proper technique and to facilitate improved neuromuscular control, strength, range of motion, and functional ability resulting in improved performance and form.   PATIENT EDUCATION:  Education details: Exercise purpose/form. Self management techniques. Education on diagnosis, prognosis, POC, anatomy and physiology of current condition.  Person educated: Patient Education method: Explanation, demonstration, verbal cuing, tactile cuing. Education comprehension: verbalized understanding, demonstrated understanding, and needs further education      HOME EXERCISE PROGRAM: Access Code: [email or web address] URL: [name] Date: 08/20/2022 Prepared by: Alleen Isle  Exercises - Sit to Stand Without Arm Support  - 3 x weekly - 3 sets - 5 reps - Seated Toe Raise  - 3 x weekly - 3 sets - 6 reps - 1 second hold - Supine Lower Trunk Rotation  - 3 x weekly - 1 sets - 20 reps - 1-5 seconds hold - Prone Press Up  - 3 x weekly - 2 sets - 5 reps  ASSESSMENT:   CLINICAL IMPRESSION:  Patient returns to PT after missing last week due to her cousin/roommate being hospitalized and coming home. Patient sore from moving furniture yesterday so spent part of session with manual interventions for pain control. Patient also continued with leg press with increased volume. She mentioned she is having increased hip pain so lateral hip strengthening was performed today. However, her L sided trendelenburg is too severe to effectively stand on one leg, so plan to try sidelying hip series next session. Patient would benefit from continued management of limiting condition by skilled physical therapist to  address remaining impairments and functional limitations to work towards stated goals and return to PLOF or maximal functional independence.   OBJECTIVE IMPAIRMENTS Abnormal gait, decreased activity tolerance, decreased balance, decreased coordination, decreased endurance, decreased mobility, difficulty walking, decreased ROM, decreased strength, impaired perceived functional ability, increased muscle spasms, impaired tone, impaired UE functional use, improper body mechanics, postural dysfunction, and pain.    ACTIVITY LIMITATIONS cleaning, community activity, driving, meal prep, laundry, and shopping.    PERSONAL FACTORS Behavior pattern, Fitness, Past/current experiences, Time since onset of injury/illness/exacerbation, Transportation, and 3+ comorbidities:    muscular dystrophy (FSHD - facioscapulohumeral muscular dystrophy), anxiety and depression, bipolar 1 disorder, borderline personality disorder, diabetes, GERD, gout, pancreatitis, alcoholic liver disease, tobacco abuse,  foot drop, neuropathy, recovering alcoholic, history of drug abuse, history of hernia repair are also affecting patient's functional outcome.      REHAB POTENTIAL: Good   CLINICAL DECISION MAKING: Evolving/moderate complexity   EVALUATION COMPLEXITY: Moderate     GOALS: Goals reviewed with patient? No   SHORT TERM GOALS: Target date: 07/24/2021   Patient will be independent with initial home exercise program for self-management of symptoms. Baseline: Initial HEP to be provided at visit 2 as appropriate (07/10/21); Goal status: Met     LONG TERM GOALS: Target date: 10/02/2021; updated to 12/27/2021 for all unmet goals on 10/04/2021; updated to 03/26/2022 for all unmet goals on 01/01/2022; updated to 06/19/2022 for all unmet goals on 03/27/2022; updated to 08/14/2022 for all unmet goals on 05/22/2022, updated to 11/12/2022 for all unmet goals on 08/20/2022, updated to 01/09/2023 for all unmet goals on 10/17/2022, updated to 05/29/2023  for all unmet goals on 03/06/2023, updated to 09/10/2023 for all unmet goals on 06/18/2023.    Patient will be independent with a long-term home exercise program for self-management of symptoms.  Baseline: Initial HEP to be provided at visit 2 as appropriate (07/10/21); participating as tolerated, recently too much pain to participate (09/04/2021); participating regularly (10/04/2021); participating as able (11/21/2021); not participating regularly over last couple of weeks (01/01/2022); no participating regularly since last progress note (02/15/2022); improving participation (03/27/2022); has not last two weeks due to recent fall (05/17/22); has not been participating (08/20/2022); patient is participating in HEP 2x a week (10/17/2022); did not participate since last PT visit (01/22/2023); sporatic participation (03/06/2023); participating well (06/18/2023);  Goal status:  In-progress   2.  Patient will demonstrate improved FOTO score by 10 points from baseline to demonstrate improvement in overall condition and self-reported functional ability.  Baseline: to be measured visit 2 as appropriate (07/10/21); 46 at visit # 2 (07/17/2021); 50 at visit # 10 (09/04/2021); 53 at visit #15 (10/04/2021); 50 at visit # 20 (11/21/2021); 52 at visit # 26 (01/01/2022); 50 at visit #30 (02/15/2022); 54 at visit # 34 (03/27/22);  53 (05/17/22); 51 at visit #44 (08/20/2022); 58 at visit #50 (10/17/2022); 51 at visit #60 (03/07/2023); Goal  status: DISCONTINUED 06/18/2023 (clinic no longer subscribes to [place] service)   3.  Patient will ambulate equal or greater than 1000 feet during 6 Min Walk Test using [facility] or less restrictive assistive device to improve community ambulation.  Baseline: To be tested visit 2 as appropriate (07/10/21); 310 feet with RW (07/17/2021); 556 feet with rollator (09/04/2021); 600 feet with rollator (10/04/2021); 300 feet with rollator - had to stop test at 3 min due to intolerable right glute pain (11/21/2021); 420 feet with  rollator and steppage gait, R > L. Stopped at 5 min due to feeling of instability in low back to mid thoracic spine (01/01/2022); 573 feet with rollator and steppage gait, R > L. Occasional standing breaks. Low back pain increased to 3.5/10 (02/15/2022);  381 feet with rollator and steppage gait, R > L. Stopped at 4:30 min due to pain in low back and right glute (03/27/2022); 124' in 1 minute 22 sec. Had to discontinue due to R back/hip pain (05/17/2022); 200 feet with rollator, discontinued due to low back/glute pain (08/20/2022); 500 feet with rollator (10/17/2022); 204 feet with rollator and steppage gait, R > L. R foot in orthopedic shoe (03/06/2023); 300 feet with rollator and steppage gait, R > L (06/18/2023); Goal status: progressing   4.  Patient will complete 5 Time Sit To Stand test in equal or less than 20 seconds with no UE support from 18.5 inch plinth to demonstrate improved LE functional strength and power for basic mobility and improved fall risk.  Baseline: 24 seconds with B UE on 18.5 inch plinth.  (07/10/21); 20 seconds with B UE on thighs from 18.5 inch plinth (09/04/2021); 19 seconds with B UE on thighs from 18.5 inch plinth (10/04/2021); 22 seconds with B UE on thighs from 18.5 inch plinth (11/21/2021); 20 seconds with B UE on thighs from 18.5 inch plinth (01/01/2022); 19 seconds without use of B UE support (02/15/2022); 21 seconds with no UE support from 18.5 inch plinth (03/27/2022); 20.5 seconds no UE support and 18" surface (05/17/2022); 20 seconds with no UE support from 18.5 inch plinth (08/20/2022); 18 seconds with no UE support from 18.5 inch plinth. Did lean back of legs on plinth for stability for 1-2 reps (03/06/2023); 18 seconds with B UE held in front of body and no outside support (06/18/2023);  Goal status: in progress     5.  Patient be able to ascend and descend stairs at her home mod I with use of B handrails.  Baseline: able to ascend/descend 4 steps in the clinic with B UE support on  handrails and supervision - reports currently needs assistance to descend steps at home. (07/10/21); able to ascend/descend 4 steps in the clinic with B UE support on handrails and supervision, leading up and down with left. able to perform with reversed LE leads (09/04/2021); able to ascend/descend 4 steps in the clinic with B UE support on handrails and supervision, step over step with heavy UE use on the way down (10/04/2021); able to ascend/descend 4 steps in the clinic with B UE support on handrails and supervision, step over step up and step to leading with R LE with heavy UE use on the way down (11/21/2021); ascend/descends 4 steps in the clinic with B UE support on handrails and supervision, step over step up and down with heavy UE use on the way down (01/01/2022); able to ascend/descend 4 steps in the clinic with B UE support on handrails and supervision, step over step  up and down with heavy UE use on the way down. leads up with right foot step to gait (02/15/2022; 03/27/22); able to ascend/descend 4 steps in the clinic with B UE support on handrails and supervision, step over step up and down with moderately heavy UE use on the way down (08/20/2022); able to ascend/descend 4 steps in the clinic with B UE support on handrails and supervision, step over step up and down with UE use on the way down (10/17/2022; 03/06/23);  able to ascend/descend 4 steps in the clinic with B UE support on handrails and supervision, step over step up and down (06/18/2023);  Goal status: MET   6.  Patient will demonstrate ability to perform static balance in tandem stance for equal or greater than 30 seconds on each side, to demonstrate improved balance and decreased fall risk.  Baseline: right foot front 10 seconds, left foot front 7 seconds (07/10/2021); right foot front 4 seconds, left foot front 6 seconds (09/04/2021); right foot front 9 seconds, left foot front 31 seconds (10/04/2021); right foot front 6.5 seconds, left foot front  8.5 seconds (11/21/2021); right foot front 8 seconds, left foot front 7 seconds (01/01/2022);  right foot front 9 seconds, left foot front 20 seconds (02/15/2022); right foot front 30 seconds, left foot front 45 seconds (03/27/2022); right foot front 16 seconds, left foot front 17 seconds (08/20/2022); right foot front 26 seconds, left foot front 43 seconds (10/17/2022); Right foot front 35 seconds, left foot front 7 seconds (03/06/2023); right foot front 7 seconds, left foot front 22 seconds (06/18/2023);  Goal status: MET 03/27/22, regressed 03/16/23, ongoing 06/18/2023  7.  Patient will demonstrate improvement in Patient Specific Functional Scale (PSFS) of equal or greater than 3 points to reflect clinically significant improvement in patient's most valued functional activities. Baseline: 4 (03/06/2023); 6.5/10 (06/18/2023);  Goal status: In progress   8.  Patient will demonstrate equal or greater than 10% improvement (132#) in calculated 1RM for double leg press to improve her LE strength for functional activities such as stairs and transfers.  Baseline: 120.3# (03/06/2023); 154.2# (06/18/2023);  Goal status: MET (06/18/2023)     PLAN: PT FREQUENCY: 2x/week   PT DURATION: 12 weeks   PLANNED INTERVENTIONS: Therapeutic exercises, Therapeutic activity, Neuromuscular re-education, Balance training, Gait training, Patient/Family education, Joint mobilization, Orthotic/Fit training, DME instructions, Dry Needling, Electrical stimulation, Cryotherapy, Moist heat, Splintting, Taping, and Manual therapy   PLAN FOR NEXT SESSION: progressive LE/functional strength/balance/muscular endurance as tolerated. Manual therapy for pain control as needed.   Carilyn Charles. Artemio Larry, PT, DPT 07/17/23, 5:22 PM  [facility] [address] [place], [place] [address] P: [phone] I F: [phone]

## 2023-07-25 ENCOUNTER — Encounter (HOSPITAL_COMMUNITY): Payer: Self-pay

## 2023-07-30 DIAGNOSIS — N182 Chronic kidney disease, stage 2 (mild): Secondary | ICD-10-CM | POA: Diagnosis not present

## 2023-07-30 DIAGNOSIS — G629 Polyneuropathy, unspecified: Secondary | ICD-10-CM | POA: Diagnosis not present

## 2023-07-30 DIAGNOSIS — I272 Pulmonary hypertension, unspecified: Secondary | ICD-10-CM | POA: Diagnosis not present

## 2023-07-30 DIAGNOSIS — K7460 Unspecified cirrhosis of liver: Secondary | ICD-10-CM | POA: Diagnosis not present

## 2023-07-30 DIAGNOSIS — E785 Hyperlipidemia, unspecified: Secondary | ICD-10-CM | POA: Diagnosis not present

## 2023-07-30 DIAGNOSIS — K766 Portal hypertension: Secondary | ICD-10-CM | POA: Diagnosis not present

## 2023-07-30 DIAGNOSIS — M109 Gout, unspecified: Secondary | ICD-10-CM | POA: Diagnosis not present

## 2023-07-30 DIAGNOSIS — J449 Chronic obstructive pulmonary disease, unspecified: Secondary | ICD-10-CM | POA: Diagnosis not present

## 2023-07-30 DIAGNOSIS — E1142 Type 2 diabetes mellitus with diabetic polyneuropathy: Secondary | ICD-10-CM | POA: Diagnosis not present

## 2023-07-31 ENCOUNTER — Ambulatory Visit: Admitting: Physical Therapy

## 2023-08-01 ENCOUNTER — Encounter

## 2023-08-01 ENCOUNTER — Other Ambulatory Visit: Payer: Self-pay | Admitting: Medical Genetics

## 2023-08-05 ENCOUNTER — Ambulatory Visit: Admitting: Physical Therapy

## 2023-08-06 ENCOUNTER — Encounter

## 2023-08-14 ENCOUNTER — Ambulatory Visit: Payer: Self-pay | Admitting: Physical Therapy

## 2023-08-19 ENCOUNTER — Ambulatory Visit
Admission: RE | Admit: 2023-08-19 | Discharge: 2023-08-19 | Disposition: A | Discharge status: Home / Self Care | Source: Ambulatory Visit | Attending: Internal Medicine | Admitting: Internal Medicine

## 2023-08-19 DIAGNOSIS — Z1231 Encounter for screening mammogram for malignant neoplasm of breast: Secondary | ICD-10-CM | POA: Insufficient documentation

## 2023-08-20 ENCOUNTER — Encounter: Admitting: Physical Therapy

## 2023-08-22 ENCOUNTER — Ambulatory Visit: Admitting: Physical Therapy

## 2023-08-27 ENCOUNTER — Ambulatory Visit: Admitting: Physical Therapy

## 2023-08-29 ENCOUNTER — Encounter: Payer: Self-pay | Admitting: Physical Therapy

## 2023-08-29 ENCOUNTER — Ambulatory Visit: Attending: Internal Medicine | Admitting: Physical Therapy

## 2023-08-29 DIAGNOSIS — Z9181 History of falling: Secondary | ICD-10-CM | POA: Diagnosis not present

## 2023-08-29 DIAGNOSIS — R2689 Other abnormalities of gait and mobility: Secondary | ICD-10-CM | POA: Insufficient documentation

## 2023-08-29 DIAGNOSIS — M6281 Muscle weakness (generalized): Secondary | ICD-10-CM | POA: Diagnosis not present

## 2023-08-29 NOTE — Therapy (Signed)
 OUTPATIENT PHYSICAL THERAPY TREATMENT / PROGRESS NOTE / RE-CERTIFICATION Dates of reporting from 06/18/2023 to 08/29/2023   Patient Name: [name] MRN: [id number] DOB:[birth year], 48 y.o., female Today's Date: 08/29/2023  PCP: Clydene Darner, PA-C REFERRING PROVIDER: Clydene Darner, PA-C  END OF SESSION:   PT End of Session - 08/29/23 1302     Visit Number 70    Number of Visits 93    Date for PT Re-Evaluation 11/21/23    Authorization Type [employer] MEDICARE/Medicaid reporting period from 06/18/2023    Progress Note Due on Visit 70    PT Start Time 1300    PT Stop Time 1340    PT Time Calculation (min) 40 min    Activity Tolerance Patient limited by fatigue;Patient limited by pain    Behavior During Therapy WFL for tasks assessed/performed            Past Medical History:  Diagnosis Date   Alcohol abuse    Anxiety    Anxiety    Anxiety and depression    Asthma    Bipolar disorder ([name])    Borderline personality disorder ([name])    Depression    Diabetes ([name])    Type 2    Dyslipidemia    Esophageal varices ([name])    Foot drop    right   [name] ([name])    GERD (gastroesophageal reflux disease)    Gout    History of drug abuse ([name])    Insomnia    Muscular dystrophies ([name])    Neuropathy    Pancreatitis    Pneumonia    Recovering alcoholic ([name])    Past Surgical History:  Procedure Laterality Date   ESOPHAGOGASTRODUODENOSCOPY N/A 04/25/2022   Procedure: ESOPHAGOGASTRODUODENOSCOPY (EGD);  Surgeon: Luke Salaam, MD;  Location: [facility] ENDOSCOPY;  Service: Gastroenterology;  Laterality: N/A;   HERNIA REPAIR     IR FLUORO GUIDE CV LINE RIGHT  12/30/2020   IR REMOVAL TUN CV CATH W/O FL  01/19/2021   IR US  GUIDE VASC ACCESS RIGHT  12/30/2020   Patient Active Problem List   Diagnosis Date Noted   Gait abnormality 05/24/2022   Right hand weakness 05/24/2022   Frequent falls 02/12/2021   Leukocytosis    Aspiration pneumonia of both lower  lobes due to gastric secretions ([name]) 01/04/2021   Pressure injury of skin 12/24/2020   Acute hepatic encephalopathy ([name]) 12/20/2020   Sepsis ([name]) 12/19/2020   Acute hyponatremia 12/18/2020   Alcohol use disorder, moderate, dependence ([name]) 12/18/2020   Hepatic steatosis 12/18/2020   Polysubstance abuse ([name]) 12/18/2020   Hyperglycemia due to type 2 diabetes mellitus ([name]) 12/18/2020   Hyperbilirubinemia 12/18/2020   Lactic acidosis 12/18/2020   Alcoholic liver disease ([name]) 12/18/2020   Bipolar 1 disorder, manic, moderate ([name]) 09/10/2018   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   Diabetes ([name]) 09/10/2018   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   Chronic gout of foot 09/29/2015   Muscular dystrophy ([name]) 09/29/2015   Type 2 diabetes mellitus without complication ([name]) 09/29/2015   Anxiety and depression 10/01/2012   Protein-calorie malnutrition, severe ([name]) 09/30/2012   Trichomonas infection 09/26/2012   Dehydration 09/26/2012   Hypokalemia 11/14/2011   Thrombocytopenia ([name]) 11/14/2011   Bipolar 2 disorder ([name]) 11/13/2011   Gout 11/13/2011     REFERRING DIAG: bilateral leg weakness  THERAPY DIAG:  Other abnormalities of gait and mobility  Muscle weakness (generalized)  History of falling  Rationale for Evaluation and Treatment Rehabilitation  PERTINENT HISTORY:  Patient is a 48 y.o. female who presents to outpatient physical therapy with a referral for medical diagnosis bilateral leg weakness. This patient's chief complaints consist of difficulty with weakness and balance with history of frequent falling leading to the following functional deficits: difficulty with basic mobility including ambulation, stairs, transfers; difficulty with ADLs, IADLs. Currently requires assistance to navigate stairs, assistance with ADLs and  IADLs, and ambulates with RW when she previously did not need an assistive device. Relevant past medical history and  comorbidities include muscular dystrophy (FSHD - facioscapulohumeral muscular dystrophy), anxiety and depression, bipolar 1 disorder, borderline personality disorder, diabetes, GERD, gout, pancreatitis, alcoholic liver disease, tobacco abuse,  foot drop, neuropathy, recovering alcoholic, history of drug abuse, history of hernia repair.  Patient denies hx of cancer, stroke, seizures, lung problems, heart problems, unexplained weight loss, unexplained changes in bowel or bladder problems, unexplained stumbling or dropping things, osteoporosis, and spinal surgery.   PRECAUTIONS: Fall, not drink alcohol.  SUBJECTIVE:  Patient reports her back is feeling really weak today and her cousin/roommate is in the hospital and off the ventilator now. Patient is also having financial difficulties. She has not been able to get to PT due to all of the difficulties in her life right now. She smoked 3 packs of cigarettes over the past 3 weeks due to the stress and it is affecting her breathing. She is here on a rollator. She may start using a bipap machine. She fell last week when she slid on a paper that is on the floor. Her cousin has been in the hospital multiple times. This time she almost passed away and is still not completely stable. Patient has been spending a lot of time at the hospital and depends on others for transportatoin. She states she has been very tired.   PAIN: NPRS 4/10 hurting all over.   OBJECTIVE    SELF-REPORTED FUNCTION Patient Specific Functional Scale (PSFS)  Basic tidying and cleaning: 5.5 Stairs getting in and out of house: 5.5 Average: 5.5/10   FUNCTIONAL/BALANCE TESTS: 6-Minute Walk Test: patient tearful and stating she is unable to do the test today, that it took almost all she had to get from the parking lot to the clinic. PT measured this distance and it is 83 feet. She used a Occupational hygienist.   5 Time Sit To Stand Test: Purpose: to assess household mobility and functional LE strength  and power Score: 17.5 seconds from 18.5 inch plinth with arms in front of body and no loss of balance Analysis: patient scored worse than the cut-off scores of 9-15 seconds that suggest increased fall risk, which reflects her dependence on an assistive device for safe mobility. Her score does meet her goal of 20 seconds under these conditions and is minimally improved from her last test where she scored 18 seconds.    Static balance (best of 1 trials): tandem stance, eyes open: right foot front 11 seconds, left foot front 23 seconds.    Stairs: able to ascend/descend 4 steps in the clinic with B UE support on handrails and supervision, step over step up and down with UE use.     Double leg press at seat position 1: 1RM (calculated from <10RM): 145.8# (last tested 08/29/2023)   TODAY'S TREATMENT:    Physical Performance Test or Measurement: a physical performance test or measurement with written report. 5TSTS: see above  Neuromuscular Re-education: a technique or exercise performed with the goal of improving the level of communication between the body and  the brain, such as for balance, motor control, muscle activation patterns, coordination, desensitization, quality of muscle contraction, proprioception, and/or kinesthetic sense needed for successful and safe completion of functional activities.   Tandem stance balance testing to assess progress  Therapeutic activities: dynamic therapeutic activities incorporating MULTIPLE parameters or areas of the body designed to achieve improved functional performance. Seated leg press, seat position 1, to improve strength with transfers and climbing stairs:  Double leg stance: 1x10 at 55#  1 min rest Double leg stance: 1x10 at 105#  1 min rest Double leg stance: 1x5 at 125# (max) > 1 min rest Double leg stance: 1x15 at 80# (~55% 1RM)  Therapeutic exercise: therapeutic exercises that incorporate ONE parameter at one or more areas of the body to  centralize symptoms, develop strength and endurance, range of motion, and flexibility required for successful completion of functional activities.  NuStep using bilateral upper and lower extremities. For improved extremity mobility, muscular endurance, and activity tolerance; and to induce the analgesic effect of aerobic exercise, stimulate improved joint nutrition, and prepare body structures and systems for following interventions. Also to reinforce understanding of appropriate exercise intensity to help meet physical activity guidelines for health.  Seat/handle setting: 9/9 5  minutes Level: 1 Target SPM: ad lib Average SPM: 61 RPE: 4/10  Pt required multimodal cuing for proper technique and to facilitate improved neuromuscular control, strength, range of motion, and functional ability resulting in improved performance and form.   PATIENT EDUCATION:  Education details: Exercise purpose/form. Self management techniques. Education on diagnosis, prognosis, POC, anatomy and physiology of current condition.  Person educated: Patient Education method: Explanation, demonstration, verbal cuing, tactile cuing. Education comprehension: verbalized understanding, demonstrated understanding, and needs further education      HOME EXERCISE PROGRAM: Access Code: [email or web address] URL: [email or web address] Date: 08/20/2022 Prepared by: Alleen Isle  Exercises - Sit to Stand Without Arm Support  - 3 x weekly - 3 sets - 5 reps - Seated Toe Raise  - 3 x weekly - 3 sets - 6 reps - 1 second hold - Supine Lower Trunk Rotation  - 3 x weekly - 1 sets - 20 reps - 1-5 seconds hold - Prone Press Up  - 3 x weekly - 2 sets - 5 reps    ASSESSMENT:   CLINICAL IMPRESSION:  Patient has attended 70 physical therapy sessions since starting current episode of care on 07/10/2021. 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 Patient has started smoking again and has not been getting good sleep due to spending so much time at the hospital. Most of patient's goals have worsened due to her lack of ability to focus on rehab. She continues to have good rehab potential and voices motivation to continue with PT. Plan to continue with rehab focusing on pain control and maximizing function in the setting of chronic neurodegenerative disorder. Patient would benefit from continued management of limiting condition by skilled physical therapist to address remaining impairments and functional limitations to work towards stated goals and return to PLOF or maximal functional independence.   OBJECTIVE IMPAIRMENTS Abnormal gait, decreased activity tolerance, decreased balance, decreased coordination, decreased endurance, decreased mobility, difficulty walking, decreased ROM, decreased strength, impaired perceived functional ability, increased muscle spasms, impaired tone, impaired UE functional use, improper  body mechanics, postural dysfunction, and pain.    ACTIVITY LIMITATIONS cleaning, community activity, driving, meal prep, laundry, and shopping.    PERSONAL FACTORS Behavior pattern, Fitness, Past/current experiences, Time since onset of injury/illness/exacerbation, Transportation, and 3+ comorbidities:    muscular dystrophy (FSHD - facioscapulohumeral muscular dystrophy), anxiety and depression, bipolar 1 disorder, borderline personality disorder, diabetes, GERD, gout, pancreatitis, alcoholic liver disease, tobacco abuse,  foot drop, neuropathy, recovering alcoholic, history of drug abuse, history of hernia repair are also affecting patient's functional outcome.      REHAB POTENTIAL: Good   CLINICAL DECISION MAKING: Evolving/moderate complexity   EVALUATION COMPLEXITY:  Moderate     GOALS: Goals reviewed with patient? No   SHORT TERM GOALS: Target date: 07/24/2021   Patient will be independent with initial home exercise program for self-management of symptoms. Baseline: Initial HEP to be provided at visit 2 as appropriate (07/10/21); Goal status: Met     LONG TERM GOALS: Target date: 10/02/2021; updated to 12/27/2021 for all unmet goals on 10/04/2021; updated to 03/26/2022 for all unmet goals on 01/01/2022; updated to 06/19/2022 for all unmet goals on 03/27/2022; updated to 08/14/2022 for all unmet goals on 05/22/2022, updated to 11/12/2022 for all unmet goals on 08/20/2022, updated to 01/09/2023 for all unmet goals on 10/17/2022, updated to 05/29/2023 for all unmet goals on 03/06/2023, updated to 09/10/2023 for all unmet goals on 06/18/2023.  Updated to 11/21/2023 for all unmet goals on 08/29/2023.    Patient will be independent with a long-term home exercise program for self-management of symptoms.  Baseline: Initial HEP to be provided at visit 2 as appropriate (07/10/21); participating as tolerated, recently too much pain to participate (09/04/2021); participating regularly (10/04/2021); participating as able (11/21/2021); not participating regularly over last couple of weeks (01/01/2022); no participating regularly since last progress note (02/15/2022); improving participation (03/27/2022); has not last two weeks due to recent fall (05/17/22); has not been participating (08/20/2022); patient is participating in HEP 2x a week (10/17/2022); did not participate since last PT visit (01/22/2023); sporatic participation (03/06/2023); participating well (06/18/2023); not participating (07/29/2023);  Goal status:  Regressed 08/29/2023   2.  Patient will demonstrate improved FOTO score by 10 points from baseline to demonstrate improvement in overall condition and self-reported functional ability.  Baseline: to be measured visit 2 as appropriate (07/10/21); 46 at visit # 2 (07/17/2021); 50 at visit # 10  (09/04/2021); 53 at visit #15 (10/04/2021); 50 at visit # 20 (11/21/2021); 52 at visit # 26 (01/01/2022); 50 at visit #30 (02/15/2022); 54 at visit # 34 (03/27/22);  53 (05/17/22); 51 at visit #44 (08/20/2022); 58 at visit #50 (10/17/2022); 51 at visit #60 (03/07/2023); Goal status: DISCONTINUED 06/18/2023 (clinic no longer subscribes to [place] service)   3.  Patient will ambulate equal or greater than 1000 feet during 6 Min Walk Test using [facility] or less restrictive assistive device to improve community ambulation.  Baseline: To be tested visit 2 as appropriate (07/10/21); 310 feet with RW (07/17/2021); 556 feet with rollator (09/04/2021); 600 feet with rollator (10/04/2021); 300 feet with rollator - had to stop test at 3 min due to intolerable right glute pain (11/21/2021); 420 feet with rollator and steppage gait, R > L. Stopped at 5 min due to feeling of instability in low back to mid thoracic spine (01/01/2022); 573 feet with rollator and steppage gait, R > L. Occasional standing breaks. Low back pain increased to 3.5/10 (02/15/2022);  381 feet with rollator and steppage gait, R > L. Stopped at  4:30 min due to pain in low back and right glute (03/27/2022); 124' in 1 minute 22 sec. Had to discontinue due to R back/hip pain (05/17/2022); 200 feet with rollator, discontinued due to low back/glute pain (08/20/2022); 500 feet with rollator (10/17/2022); 204 feet with rollator and steppage gait, R > L. R foot in orthopedic shoe (03/06/2023); 300 feet with rollator and steppage gait, R > L (06/18/2023); 83 feet with rollator (08/29/2023);  Goal status: regressed 08/29/2023   4.  Patient will complete 5 Time Sit To Stand test in equal or less than 20 seconds with no UE support from 18.5 inch plinth to demonstrate improved LE functional strength and power for basic mobility and improved fall risk.  Baseline: 24 seconds with B UE on 18.5 inch plinth.  (07/10/21); 20 seconds with B UE on thighs from 18.5 inch plinth (09/04/2021); 19 seconds with B  UE on thighs from 18.5 inch plinth (10/04/2021); 22 seconds with B UE on thighs from 18.5 inch plinth (11/21/2021); 20 seconds with B UE on thighs from 18.5 inch plinth (01/01/2022); 19 seconds without use of B UE support (02/15/2022); 21 seconds with no UE support from 18.5 inch plinth (03/27/2022); 20.5 seconds no UE support and 18 surface (05/17/2022); 20 seconds with no UE support from 18.5 inch plinth (08/20/2022); 18 seconds with no UE support from 18.5 inch plinth. Did lean back of legs on plinth for stability for 1-2 reps (03/06/2023); 18 seconds with B UE held in front of body and no outside support (06/18/2023); 17.5 seconds with B UE held in front of body and no outside support (08/29/2023);  Goal status: ongoing      5.  Patient be able to ascend and descend stairs at her home mod I with use of B handrails.  Baseline: able to ascend/descend 4 steps in the clinic with B UE support on handrails and supervision - reports currently needs assistance to descend steps at home. (07/10/21); able to ascend/descend 4 steps in the clinic with B UE support on handrails and supervision, leading up and down with left. able to perform with reversed LE leads (09/04/2021); able to ascend/descend 4 steps in the clinic with B UE support on handrails and supervision, step over step with heavy UE use on the way down (10/04/2021); able to ascend/descend 4 steps in the clinic with B UE support on handrails and supervision, step over step up and step to leading with R LE with heavy UE use on the way down (11/21/2021); ascend/descends 4 steps in the clinic with B UE support on handrails and supervision, step over step up and down with heavy UE use on the way down (01/01/2022); able to ascend/descend 4 steps in the clinic with B UE support on handrails and supervision, step over step up and down with heavy UE use on the way down. leads up with right foot step to gait (02/15/2022; 03/27/22); able to ascend/descend 4 steps in the clinic with B  UE support on handrails and supervision, step over step up and down with moderately heavy UE use on the way down (08/20/2022); able to ascend/descend 4 steps in the clinic with B UE support on handrails and supervision, step over step up and down with UE use on the way down (10/17/2022; 03/06/23);  able to ascend/descend 4 steps in the clinic with B UE support on handrails and supervision, step over step up and down (06/18/2023; 08/29/2023);  Goal status: MET   6.  Patient will demonstrate ability  to perform static balance in tandem stance for equal or greater than 30 seconds on each side, to demonstrate improved balance and decreased fall risk.  Baseline: right foot front 10 seconds, left foot front 7 seconds (07/10/2021); right foot front 4 seconds, left foot front 6 seconds (09/04/2021); right foot front 9 seconds, left foot front 31 seconds (10/04/2021); right foot front 6.5 seconds, left foot front 8.5 seconds (11/21/2021); right foot front 8 seconds, left foot front 7 seconds (01/01/2022);  right foot front 9 seconds, left foot front 20 seconds (02/15/2022); right foot front 30 seconds, left foot front 45 seconds (03/27/2022); right foot front 16 seconds, left foot front 17 seconds (08/20/2022); right foot front 26 seconds, left foot front 43 seconds (10/17/2022); Right foot front 35 seconds, left foot front 7 seconds (03/06/2023); right foot front 7 seconds, left foot front 22 seconds (06/18/2023); Right foot front 11 seconds, left foot front 23 seconds (08/29/23);  Goal status: MET 03/27/22, regressed 03/16/23, ongoing 06/18/2023, In progress 05/29/2023  7.  Patient will demonstrate improvement in Patient Specific Functional Scale (PSFS) of equal or greater than 3 points to reflect clinically significant improvement in patient's most valued functional activities. Baseline: 4 (03/06/2023); 6.5/10 (06/18/2023); 5.5 (08/29/2023);  Goal status: In progress   8.  Patient will demonstrate equal or greater than 10% improvement  (132#) in calculated 1RM for double leg press to improve her LE strength for functional activities such as stairs and transfers.  Baseline: 120.3# (03/06/2023); 154.2# (06/18/2023); 145.8# (08/29/2023);  Goal status: MET (06/18/2023); Regressed 08/29/23     PLAN: PT FREQUENCY: 2x/week   PT DURATION: 12 weeks   PLANNED INTERVENTIONS: Therapeutic exercises, Therapeutic activity, Neuromuscular re-education, Balance training, Gait training, Patient/Family education, Joint mobilization, Orthotic/Fit training, DME instructions, Dry Needling, Electrical stimulation, Cryotherapy, Moist heat, Splintting, Taping, and Manual therapy   PLAN FOR NEXT SESSION: progressive LE/functional strength/balance/muscular endurance as tolerated. Manual therapy for pain control as needed.   Carilyn Charles. Artemio Larry, PT, DPT 08/29/23, 2:17 PM  [facility] [address] [place], [place] [address] P: [phone] I F: [phone]

## 2023-09-03 ENCOUNTER — Encounter: Payer: Self-pay | Admitting: Physical Therapy

## 2023-09-03 ENCOUNTER — Ambulatory Visit: Admitting: Physical Therapy

## 2023-09-03 DIAGNOSIS — R2689 Other abnormalities of gait and mobility: Secondary | ICD-10-CM | POA: Diagnosis not present

## 2023-09-03 DIAGNOSIS — M6281 Muscle weakness (generalized): Secondary | ICD-10-CM | POA: Diagnosis not present

## 2023-09-03 DIAGNOSIS — Z9181 History of falling: Secondary | ICD-10-CM | POA: Diagnosis not present

## 2023-09-03 NOTE — Therapy (Signed)
 OUTPATIENT PHYSICAL THERAPY TREATMENT    Patient Name: [name] MRN: [id number] DOB:01/01/1976, 48 y.o., female Today's Date: 09/03/2023  PCP: Clydene Darner, PA-C REFERRING PROVIDER: Clydene Darner, PA-C  END OF SESSION:   PT End of Session - 09/03/23 1317     Visit Number 71    Number of Visits 93    Date for PT Re-Evaluation 11/21/23    Authorization Type [employer] MEDICARE/Medicaid reporting period from 08/29/2023    Progress Note Due on Visit 70    PT Start Time 1303    PT Stop Time 1342    PT Time Calculation (min) 39 min    Activity Tolerance Patient limited by fatigue;Patient limited by pain;Patient tolerated treatment well    Behavior During Therapy WFL for tasks assessed/performed             Past Medical History:  Diagnosis Date   Alcohol abuse    Anxiety    Anxiety    Anxiety and depression    Asthma    Bipolar disorder ([name])    Borderline personality disorder ([name])    Depression    Diabetes ([name])    Type 2    Dyslipidemia    Esophageal varices ([name])    Foot drop    right   [name] ([name])    GERD (gastroesophageal reflux disease)    Gout    History of drug abuse ([name])    Insomnia    Muscular dystrophies ([name])    Neuropathy    Pancreatitis    Pneumonia    Recovering alcoholic ([name])    Past Surgical History:  Procedure Laterality Date   ESOPHAGOGASTRODUODENOSCOPY N/A 04/25/2022   Procedure: ESOPHAGOGASTRODUODENOSCOPY (EGD);  Surgeon: Luke Salaam, MD;  Location: [facility] ENDOSCOPY;  Service: Gastroenterology;  Laterality: N/A;   HERNIA REPAIR     IR FLUORO GUIDE CV LINE RIGHT  12/30/2020   IR REMOVAL TUN CV CATH W/O FL  01/19/2021   IR US  GUIDE VASC ACCESS RIGHT  12/30/2020   Patient Active Problem List   Diagnosis Date Noted   Gait abnormality 05/24/2022   Right hand weakness 05/24/2022   Frequent falls 02/12/2021   Leukocytosis    Aspiration pneumonia of both lower lobes due to gastric secretions ([name])  01/04/2021   Pressure injury of skin 12/24/2020   Acute hepatic encephalopathy ([name]) 12/20/2020   Sepsis ([name]) 12/19/2020   Acute hyponatremia 12/18/2020   Alcohol use disorder, moderate, dependence ([name]) 12/18/2020   Hepatic steatosis 12/18/2020   Polysubstance abuse ([name]) 12/18/2020   Hyperglycemia due to type 2 diabetes mellitus ([name]) 12/18/2020   Hyperbilirubinemia 12/18/2020   Lactic acidosis 12/18/2020   Alcoholic liver disease ([name]) 12/18/2020   Bipolar 1 disorder, manic, moderate ([name]) 09/10/2018   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   Diabetes ([name]) 09/10/2018   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   Chronic gout of foot 09/29/2015   Muscular dystrophy ([name]) 09/29/2015   Type 2 diabetes mellitus without complication ([name]) 09/29/2015   Anxiety and depression 10/01/2012   Protein-calorie malnutrition, severe ([name]) 09/30/2012   Trichomonas infection 09/26/2012   Dehydration 09/26/2012   Hypokalemia 11/14/2011   Thrombocytopenia ([name]) 11/14/2011   Bipolar 2 disorder ([name]) 11/13/2011   Gout 11/13/2011     REFERRING DIAG: bilateral leg weakness  THERAPY DIAG:  Other abnormalities of gait and mobility  Muscle weakness (generalized)  History of falling  Rationale for Evaluation and Treatment Rehabilitation  PERTINENT HISTORY: Patient is a 48 y.o. female who  presents to outpatient physical therapy with a referral for medical diagnosis bilateral leg weakness. This patient's chief complaints consist of difficulty with weakness and balance with history of frequent falling leading to the following functional deficits: difficulty with basic mobility including ambulation, stairs, transfers; difficulty with ADLs, IADLs. Currently requires assistance to navigate stairs, assistance with ADLs and  IADLs, and ambulates with RW when she previously did not need an assistive device. Relevant past medical history and comorbidities include muscular dystrophy (FSHD -  facioscapulohumeral muscular dystrophy), anxiety and depression, bipolar 1 disorder, borderline personality disorder, diabetes, GERD, gout, pancreatitis, alcoholic liver disease, tobacco abuse,  foot drop, neuropathy, recovering alcoholic, history of drug abuse, history of hernia repair.  Patient denies hx of cancer, stroke, seizures, lung problems, heart problems, unexplained weight loss, unexplained changes in bowel or bladder problems, unexplained stumbling or dropping things, osteoporosis, and spinal surgery.   PRECAUTIONS: Fall, not drink alcohol.  SUBJECTIVE:  Patient reports she is sore from last PT session. She continues to spend a lot of time at the hospital 5-8 hours a day and is sleeping like the dead at night. She had a time when she felt the urge to drink really strongly for a few hours. Her doctor increased her Prozac  by 20 mg.   PAIN: NPRS 6/10 R glute and 4/10 R knee.   OBJECTIVE   Double leg press at seat position 1: 1RM (calculated from <10RM): 145.8# (last tested 08/29/2023)   TODAY'S TREATMENT:   Therapeutic activities: dynamic therapeutic activities incorporating MULTIPLE parameters or areas of the body designed to achieve improved functional performance. Seated leg press, seat position 1, to improve strength with transfers and climbing stairs:  Double leg stance: 1x10 at 45# 1 min rest Double leg stance: 1x10 at 55#  1 min rest Double leg stance: 1x10 at 80# (~55% 1RM) 1 min rest Double leg stance: 1x5 at 80# (~55% 1RM) 1 min rest Double leg stance: 1x15 at 80# (~55% 1RM)  Therapeutic exercise: therapeutic exercises that incorporate ONE parameter at one or more areas of the body to centralize symptoms, develop strength and endurance, range of motion, and flexibility required for successful completion of functional activities.  Seated knee extension on OMEGA machine, seat position 2 B LE:  1x10 at 15# 1x10 at 25# 1x10 at 35# (last few hurt from knees to  quads) 1x10 at 25#  Seated hamstring curl on OMEGA machine, seat position 2 B LE:  3x10 at 25#  Manual therapy: to reduce pain and tissue tension, improve range of motion, neuromodulation, in order to promote improved ability to complete functional activities. PRONE  CPA grade III over lumbar spine segments STM to R > L glute and posterior hip muscles  Pt required multimodal cuing for proper technique and to facilitate improved neuromuscular control, strength, range of motion, and functional ability resulting in improved performance and form.   PATIENT EDUCATION:  Education details: Exercise purpose/form. Self management techniques. Education on diagnosis, prognosis, POC, anatomy and physiology of current condition.  Person educated: Patient Education method: Explanation, demonstration, verbal cuing, tactile cuing. Education comprehension: verbalized understanding, demonstrated understanding, and needs further education      HOME EXERCISE PROGRAM: Access Code: [email or web address] URL: [email or web address] Date: 08/20/2022 Prepared by: Alleen Isle  Exercises - Sit to Stand Without Arm Support  - 3 x weekly - 3 sets - 5 reps - Seated Toe Raise  - 3 x weekly - 3 sets - 6 reps - 1 second hold - Supine Lower Trunk  Rotation  - 3 x weekly - 1 sets - 20 reps - 1-5 seconds hold - Prone Press Up  - 3 x weekly - 2 sets - 5 reps    ASSESSMENT:   CLINICAL IMPRESSION:  Patient with more pain today and with continued exhaustion but was quite motivated to work on LE strengthening with machines. She tolerated session well and reported slight improvement in R glute pain at the end of the session after manual therapy. She was educated on potential DOMS and appropriate response. She continues to be severely limited in functional mobility and balance due to muscle performance and neurologic deficits. Patient would benefit from continued management of limiting condition by skilled physical  therapist to address remaining impairments and functional limitations to work towards stated goals and return to PLOF or maximal functional independence.    OBJECTIVE IMPAIRMENTS Abnormal gait, decreased activity tolerance, decreased balance, decreased coordination, decreased endurance, decreased mobility, difficulty walking, decreased ROM, decreased strength, impaired perceived functional ability, increased muscle spasms, impaired tone, impaired UE functional use, improper body mechanics, postural dysfunction, and pain.    ACTIVITY LIMITATIONS cleaning, community activity, driving, meal prep, laundry, and shopping.    PERSONAL FACTORS Behavior pattern, Fitness, Past/current experiences, Time since onset of injury/illness/exacerbation, Transportation, and 3+ comorbidities:    muscular dystrophy (FSHD - facioscapulohumeral muscular dystrophy), anxiety and depression, bipolar 1 disorder, borderline personality disorder, diabetes, GERD, gout, pancreatitis, alcoholic liver disease, tobacco abuse,  foot drop, neuropathy, recovering alcoholic, history of drug abuse, history of hernia repair are also affecting patient's functional outcome.      REHAB POTENTIAL: Good   CLINICAL DECISION MAKING: Evolving/moderate complexity   EVALUATION COMPLEXITY: Moderate     GOALS: Goals reviewed with patient? No   SHORT TERM GOALS: Target date: 07/24/2021   Patient will be independent with initial home exercise program for self-management of symptoms. Baseline: Initial HEP to be provided at visit 2 as appropriate (07/10/21); Goal status: Met     LONG TERM GOALS: Target date: 10/02/2021; updated to 12/27/2021 for all unmet goals on 10/04/2021; updated to 03/26/2022 for all unmet goals on 01/01/2022; updated to 06/19/2022 for all unmet goals on 03/27/2022; updated to 08/14/2022 for all unmet goals on 05/22/2022, updated to 11/12/2022 for all unmet goals on 08/20/2022, updated to 01/09/2023 for all unmet goals on 10/17/2022,  updated to 05/29/2023 for all unmet goals on 03/06/2023, updated to 09/10/2023 for all unmet goals on 06/18/2023.  Updated to 11/21/2023 for all unmet goals on 08/29/2023.    Patient will be independent with a long-term home exercise program for self-management of symptoms.  Baseline: Initial HEP to be provided at visit 2 as appropriate (07/10/21); participating as tolerated, recently too much pain to participate (09/04/2021); participating regularly (10/04/2021); participating as able (11/21/2021); not participating regularly over last couple of weeks (01/01/2022); no participating regularly since last progress note (02/15/2022); improving participation (03/27/2022); has not last two weeks due to recent fall (05/17/22); has not been participating (08/20/2022); patient is participating in HEP 2x a week (10/17/2022); did not participate since last PT visit (01/22/2023); sporatic participation (03/06/2023); participating well (06/18/2023); not participating (07/29/2023);  Goal status:  Regressed 08/29/2023   2.  Patient will demonstrate improved FOTO score by 10 points from baseline to demonstrate improvement in overall condition and self-reported functional ability.  Baseline: to be measured visit 2 as appropriate (07/10/21); 46 at visit # 2 (07/17/2021); 50 at visit # 10 (09/04/2021); 53 at visit #15 (10/04/2021); 50 at visit # 20 (11/21/2021);  52 at visit # 26 (01/01/2022); 50 at visit #30 (02/15/2022); 54 at visit # 34 (03/27/22);  53 (05/17/22); 51 at visit #44 (08/20/2022); 58 at visit #50 (10/17/2022); 51 at visit #60 (03/07/2023); Goal status: DISCONTINUED 06/18/2023 (clinic no longer subscribes to [place] service)   3.  Patient will ambulate equal or greater than 1000 feet during 6 Min Walk Test using [facility] or less restrictive assistive device to improve community ambulation.  Baseline: To be tested visit 2 as appropriate (07/10/21); 310 feet with RW (07/17/2021); 556 feet with rollator (09/04/2021); 600 feet with rollator (10/04/2021); 300  feet with rollator - had to stop test at 3 min due to intolerable right glute pain (11/21/2021); 420 feet with rollator and steppage gait, R > L. Stopped at 5 min due to feeling of instability in low back to mid thoracic spine (01/01/2022); 573 feet with rollator and steppage gait, R > L. Occasional standing breaks. Low back pain increased to 3.5/10 (02/15/2022);  381 feet with rollator and steppage gait, R > L. Stopped at 4:30 min due to pain in low back and right glute (03/27/2022); 124' in 1 minute 22 sec. Had to discontinue due to R back/hip pain (05/17/2022); 200 feet with rollator, discontinued due to low back/glute pain (08/20/2022); 500 feet with rollator (10/17/2022); 204 feet with rollator and steppage gait, R > L. R foot in orthopedic shoe (03/06/2023); 300 feet with rollator and steppage gait, R > L (06/18/2023); 83 feet with rollator (08/29/2023);  Goal status: regressed 08/29/2023   4.  Patient will complete 5 Time Sit To Stand test in equal or less than 20 seconds with no UE support from 18.5 inch plinth to demonstrate improved LE functional strength and power for basic mobility and improved fall risk.  Baseline: 24 seconds with B UE on 18.5 inch plinth.  (07/10/21); 20 seconds with B UE on thighs from 18.5 inch plinth (09/04/2021); 19 seconds with B UE on thighs from 18.5 inch plinth (10/04/2021); 22 seconds with B UE on thighs from 18.5 inch plinth (11/21/2021); 20 seconds with B UE on thighs from 18.5 inch plinth (01/01/2022); 19 seconds without use of B UE support (02/15/2022); 21 seconds with no UE support from 18.5 inch plinth (03/27/2022); 20.5 seconds no UE support and 18 surface (05/17/2022); 20 seconds with no UE support from 18.5 inch plinth (08/20/2022); 18 seconds with no UE support from 18.5 inch plinth. Did lean back of legs on plinth for stability for 1-2 reps (03/06/2023); 18 seconds with B UE held in front of body and no outside support (06/18/2023); 17.5 seconds with B UE held in front of body and no  outside support (08/29/2023);  Goal status: ongoing      5.  Patient be able to ascend and descend stairs at her home mod I with use of B handrails.  Baseline: able to ascend/descend 4 steps in the clinic with B UE support on handrails and supervision - reports currently needs assistance to descend steps at home. (07/10/21); able to ascend/descend 4 steps in the clinic with B UE support on handrails and supervision, leading up and down with left. able to perform with reversed LE leads (09/04/2021); able to ascend/descend 4 steps in the clinic with B UE support on handrails and supervision, step over step with heavy UE use on the way down (10/04/2021); able to ascend/descend 4 steps in the clinic with B UE support on handrails and supervision, step over step up and step to leading with R  LE with heavy UE use on the way down (11/21/2021); ascend/descends 4 steps in the clinic with B UE support on handrails and supervision, step over step up and down with heavy UE use on the way down (01/01/2022); able to ascend/descend 4 steps in the clinic with B UE support on handrails and supervision, step over step up and down with heavy UE use on the way down. leads up with right foot step to gait (02/15/2022; 03/27/22); able to ascend/descend 4 steps in the clinic with B UE support on handrails and supervision, step over step up and down with moderately heavy UE use on the way down (08/20/2022); able to ascend/descend 4 steps in the clinic with B UE support on handrails and supervision, step over step up and down with UE use on the way down (10/17/2022; 03/06/23);  able to ascend/descend 4 steps in the clinic with B UE support on handrails and supervision, step over step up and down (06/18/2023; 08/29/2023);  Goal status: MET   6.  Patient will demonstrate ability to perform static balance in tandem stance for equal or greater than 30 seconds on each side, to demonstrate improved balance and decreased fall risk.  Baseline: right  foot front 10 seconds, left foot front 7 seconds (07/10/2021); right foot front 4 seconds, left foot front 6 seconds (09/04/2021); right foot front 9 seconds, left foot front 31 seconds (10/04/2021); right foot front 6.5 seconds, left foot front 8.5 seconds (11/21/2021); right foot front 8 seconds, left foot front 7 seconds (01/01/2022);  right foot front 9 seconds, left foot front 20 seconds (02/15/2022); right foot front 30 seconds, left foot front 45 seconds (03/27/2022); right foot front 16 seconds, left foot front 17 seconds (08/20/2022); right foot front 26 seconds, left foot front 43 seconds (10/17/2022); Right foot front 35 seconds, left foot front 7 seconds (03/06/2023); right foot front 7 seconds, left foot front 22 seconds (06/18/2023); Right foot front 11 seconds, left foot front 23 seconds (08/29/23);  Goal status: MET 03/27/22, regressed 03/16/23, ongoing 06/18/2023, In progress 05/29/2023  7.  Patient will demonstrate improvement in Patient Specific Functional Scale (PSFS) of equal or greater than 3 points to reflect clinically significant improvement in patient's most valued functional activities. Baseline: 4 (03/06/2023); 6.5/10 (06/18/2023); 5.5 (08/29/2023);  Goal status: In progress   8.  Patient will demonstrate equal or greater than 10% improvement (132#) in calculated 1RM for double leg press to improve her LE strength for functional activities such as stairs and transfers.  Baseline: 120.3# (03/06/2023); 154.2# (06/18/2023); 145.8# (08/29/2023);  Goal status: MET (06/18/2023); Regressed 08/29/23     PLAN: PT FREQUENCY: 2x/week   PT DURATION: 12 weeks   PLANNED INTERVENTIONS: Therapeutic exercises, Therapeutic activity, Neuromuscular re-education, Balance training, Gait training, Patient/Family education, Joint mobilization, Orthotic/Fit training, DME instructions, Dry Needling, Electrical stimulation, Cryotherapy, Moist heat, Splintting, Taping, and Manual therapy   PLAN FOR NEXT SESSION: progressive  LE/functional strength/balance/muscular endurance as tolerated. Manual therapy for pain control as needed.   Carilyn Charles. Artemio Larry, PT, DPT 09/03/23, 3:42 PM  [facility] [address] [place], [place] [address] P: [phone] I F: [phone]

## 2023-09-05 ENCOUNTER — Ambulatory Visit: Admitting: Physical Therapy

## 2023-09-09 ENCOUNTER — Ambulatory Visit: Admitting: Physical Therapy

## 2023-09-16 ENCOUNTER — Ambulatory Visit: Admitting: Physical Therapy

## 2023-09-19 ENCOUNTER — Encounter: Admitting: Physical Therapy

## 2023-09-23 ENCOUNTER — Ambulatory Visit: Admitting: Physical Therapy

## 2023-09-26 ENCOUNTER — Encounter: Admitting: Physical Therapy

## 2023-09-30 ENCOUNTER — Encounter: Admitting: Physical Therapy

## 2023-10-01 ENCOUNTER — Ambulatory Visit: Admitting: Physical Therapy

## 2023-10-08 ENCOUNTER — Ambulatory Visit: Admitting: Physical Therapy

## 2023-10-15 ENCOUNTER — Ambulatory Visit: Admitting: Physical Therapy

## 2023-10-22 ENCOUNTER — Ambulatory Visit: Attending: Internal Medicine | Admitting: Physical Therapy

## 2023-10-28 ENCOUNTER — Encounter: Admitting: Physical Therapy

## 2023-10-30 ENCOUNTER — Encounter: Admitting: Physical Therapy

## 2023-11-05 ENCOUNTER — Encounter: Admitting: Physical Therapy

## 2023-11-07 ENCOUNTER — Encounter: Admitting: Physical Therapy

## 2023-11-07 DIAGNOSIS — G629 Polyneuropathy, unspecified: Secondary | ICD-10-CM | POA: Diagnosis not present

## 2023-11-07 DIAGNOSIS — E785 Hyperlipidemia, unspecified: Secondary | ICD-10-CM | POA: Diagnosis not present

## 2023-11-07 DIAGNOSIS — M109 Gout, unspecified: Secondary | ICD-10-CM | POA: Diagnosis not present

## 2023-11-07 DIAGNOSIS — N182 Chronic kidney disease, stage 2 (mild): Secondary | ICD-10-CM | POA: Diagnosis not present

## 2023-11-07 DIAGNOSIS — E1142 Type 2 diabetes mellitus with diabetic polyneuropathy: Secondary | ICD-10-CM | POA: Diagnosis not present

## 2023-11-07 DIAGNOSIS — J309 Allergic rhinitis, unspecified: Secondary | ICD-10-CM | POA: Diagnosis not present

## 2023-11-07 DIAGNOSIS — J449 Chronic obstructive pulmonary disease, unspecified: Secondary | ICD-10-CM | POA: Diagnosis not present

## 2023-11-13 ENCOUNTER — Encounter: Admitting: Physical Therapy

## 2023-11-14 DIAGNOSIS — M6281 Muscle weakness (generalized): Secondary | ICD-10-CM | POA: Diagnosis not present

## 2023-11-14 DIAGNOSIS — R54 Age-related physical debility: Secondary | ICD-10-CM | POA: Diagnosis not present

## 2023-11-20 DIAGNOSIS — R54 Age-related physical debility: Secondary | ICD-10-CM | POA: Diagnosis not present

## 2023-11-20 DIAGNOSIS — M6281 Muscle weakness (generalized): Secondary | ICD-10-CM | POA: Diagnosis not present

## 2023-11-27 DIAGNOSIS — R54 Age-related physical debility: Secondary | ICD-10-CM | POA: Diagnosis not present

## 2023-11-27 DIAGNOSIS — M6281 Muscle weakness (generalized): Secondary | ICD-10-CM | POA: Diagnosis not present

## 2023-12-02 DIAGNOSIS — R54 Age-related physical debility: Secondary | ICD-10-CM | POA: Diagnosis not present

## 2023-12-02 DIAGNOSIS — M6281 Muscle weakness (generalized): Secondary | ICD-10-CM | POA: Diagnosis not present

## 2023-12-05 DIAGNOSIS — T148XXA Other injury of unspecified body region, initial encounter: Secondary | ICD-10-CM | POA: Diagnosis not present

## 2023-12-05 DIAGNOSIS — R29898 Other symptoms and signs involving the musculoskeletal system: Secondary | ICD-10-CM | POA: Diagnosis not present

## 2023-12-05 DIAGNOSIS — E1142 Type 2 diabetes mellitus with diabetic polyneuropathy: Secondary | ICD-10-CM | POA: Diagnosis not present

## 2023-12-07 DIAGNOSIS — L03032 Cellulitis of left toe: Secondary | ICD-10-CM | POA: Diagnosis not present

## 2023-12-11 DIAGNOSIS — M6281 Muscle weakness (generalized): Secondary | ICD-10-CM | POA: Diagnosis not present

## 2023-12-11 DIAGNOSIS — R54 Age-related physical debility: Secondary | ICD-10-CM | POA: Diagnosis not present

## 2023-12-12 DIAGNOSIS — L03116 Cellulitis of left lower limb: Secondary | ICD-10-CM | POA: Diagnosis not present

## 2023-12-12 DIAGNOSIS — E11621 Type 2 diabetes mellitus with foot ulcer: Secondary | ICD-10-CM | POA: Diagnosis not present

## 2023-12-12 DIAGNOSIS — L089 Local infection of the skin and subcutaneous tissue, unspecified: Secondary | ICD-10-CM | POA: Diagnosis not present

## 2023-12-12 DIAGNOSIS — L97528 Non-pressure chronic ulcer of other part of left foot with other specified severity: Secondary | ICD-10-CM | POA: Diagnosis not present

## 2023-12-12 DIAGNOSIS — E119 Type 2 diabetes mellitus without complications: Secondary | ICD-10-CM | POA: Diagnosis not present

## 2023-12-12 DIAGNOSIS — L03119 Cellulitis of unspecified part of limb: Secondary | ICD-10-CM | POA: Diagnosis not present

## 2023-12-12 DIAGNOSIS — R600 Localized edema: Secondary | ICD-10-CM | POA: Diagnosis not present

## 2023-12-12 DIAGNOSIS — L02612 Cutaneous abscess of left foot: Secondary | ICD-10-CM | POA: Diagnosis not present

## 2023-12-13 DIAGNOSIS — L02612 Cutaneous abscess of left foot: Secondary | ICD-10-CM | POA: Diagnosis not present

## 2023-12-13 DIAGNOSIS — E119 Type 2 diabetes mellitus without complications: Secondary | ICD-10-CM | POA: Diagnosis not present

## 2023-12-13 DIAGNOSIS — L03116 Cellulitis of left lower limb: Secondary | ICD-10-CM | POA: Diagnosis not present

## 2023-12-14 DIAGNOSIS — E119 Type 2 diabetes mellitus without complications: Secondary | ICD-10-CM | POA: Diagnosis not present

## 2023-12-17 DIAGNOSIS — M21372 Foot drop, left foot: Secondary | ICD-10-CM | POA: Diagnosis not present

## 2023-12-18 DIAGNOSIS — R54 Age-related physical debility: Secondary | ICD-10-CM | POA: Diagnosis not present

## 2023-12-18 DIAGNOSIS — M6281 Muscle weakness (generalized): Secondary | ICD-10-CM | POA: Diagnosis not present

## 2023-12-19 ENCOUNTER — Ambulatory Visit: Admitting: Podiatry

## 2024-01-02 ENCOUNTER — Ambulatory Visit: Admitting: Podiatry

## 2024-01-12 ENCOUNTER — Other Ambulatory Visit: Payer: Self-pay | Admitting: Medical Genetics

## 2024-01-12 DIAGNOSIS — Z006 Encounter for examination for normal comparison and control in clinical research program: Secondary | ICD-10-CM

## 2024-01-14 DIAGNOSIS — L0390 Cellulitis, unspecified: Secondary | ICD-10-CM | POA: Diagnosis not present

## 2024-01-14 DIAGNOSIS — E1142 Type 2 diabetes mellitus with diabetic polyneuropathy: Secondary | ICD-10-CM | POA: Diagnosis not present

## 2024-01-27 NOTE — Progress Notes (Signed)
 [name]                                          MRN: [id number]   01/27/2024   The [facility] Quality Team Specialist reviewed this patient medical record for the purposes of chart review for care gap closure. The following were reviewed: chart review for care gap closure-kidney health evaluation for diabetes:eGFR  and uACR.    [facility] Quality Team

## 2024-02-03 ENCOUNTER — Encounter (HOSPITAL_BASED_OUTPATIENT_CLINIC_OR_DEPARTMENT_OTHER): Attending: General Surgery | Admitting: General Surgery

## 2024-02-03 DIAGNOSIS — F17290 Nicotine dependence, other tobacco product, uncomplicated: Secondary | ICD-10-CM | POA: Insufficient documentation

## 2024-02-03 DIAGNOSIS — L97522 Non-pressure chronic ulcer of other part of left foot with fat layer exposed: Secondary | ICD-10-CM | POA: Insufficient documentation

## 2024-02-03 DIAGNOSIS — G7102 Facioscapulohumeral muscular dystrophy: Secondary | ICD-10-CM | POA: Insufficient documentation

## 2024-02-03 DIAGNOSIS — L97512 Non-pressure chronic ulcer of other part of right foot with fat layer exposed: Secondary | ICD-10-CM | POA: Insufficient documentation

## 2024-02-03 DIAGNOSIS — E11621 Type 2 diabetes mellitus with foot ulcer: Secondary | ICD-10-CM | POA: Insufficient documentation

## 2024-02-03 DIAGNOSIS — G9009 Other idiopathic peripheral autonomic neuropathy: Secondary | ICD-10-CM | POA: Insufficient documentation

## 2024-02-17 ENCOUNTER — Encounter (HOSPITAL_BASED_OUTPATIENT_CLINIC_OR_DEPARTMENT_OTHER): Attending: General Surgery | Admitting: General Surgery

## 2024-02-17 DIAGNOSIS — G9009 Other idiopathic peripheral autonomic neuropathy: Secondary | ICD-10-CM | POA: Insufficient documentation

## 2024-02-17 DIAGNOSIS — E11621 Type 2 diabetes mellitus with foot ulcer: Secondary | ICD-10-CM | POA: Insufficient documentation

## 2024-02-17 DIAGNOSIS — L97512 Non-pressure chronic ulcer of other part of right foot with fat layer exposed: Secondary | ICD-10-CM | POA: Insufficient documentation

## 2024-02-17 DIAGNOSIS — L97522 Non-pressure chronic ulcer of other part of left foot with fat layer exposed: Secondary | ICD-10-CM | POA: Insufficient documentation

## 2024-02-17 DIAGNOSIS — G7102 Facioscapulohumeral muscular dystrophy: Secondary | ICD-10-CM | POA: Insufficient documentation

## 2024-03-03 ENCOUNTER — Encounter (HOSPITAL_BASED_OUTPATIENT_CLINIC_OR_DEPARTMENT_OTHER): Admitting: General Surgery

## 2024-03-03 DIAGNOSIS — E11621 Type 2 diabetes mellitus with foot ulcer: Secondary | ICD-10-CM | POA: Diagnosis not present

## 2024-03-03 NOTE — Progress Notes (Signed)
 [name]                                          MRN: [id number]   03/03/2024   The [facility] Quality Team Specialist reviewed this patient medical record for the purposes of chart review for care gap closure. The following were reviewed: abstraction for care gap closure-glycemic status assessment.    [facility] Quality Team

## 2024-03-03 NOTE — Progress Notes (Signed)
 [name]                                          MRN: [id number]   03/03/2024   The [facility] Quality Team Specialist reviewed this patient medical record for the purposes of chart review for care gap closure. The following were reviewed: chart review for care gap closure-kidney health evaluation for diabetes:eGFR  and uACR.    [facility] Quality Team

## 2024-03-24 ENCOUNTER — Encounter (HOSPITAL_BASED_OUTPATIENT_CLINIC_OR_DEPARTMENT_OTHER): Admitting: General Surgery

## 2024-03-30 ENCOUNTER — Encounter (HOSPITAL_BASED_OUTPATIENT_CLINIC_OR_DEPARTMENT_OTHER): Attending: General Surgery | Admitting: General Surgery

## 2024-03-30 DIAGNOSIS — G9009 Other idiopathic peripheral autonomic neuropathy: Secondary | ICD-10-CM | POA: Diagnosis not present

## 2024-03-30 DIAGNOSIS — G7102 Facioscapulohumeral muscular dystrophy: Secondary | ICD-10-CM | POA: Insufficient documentation

## 2024-03-30 DIAGNOSIS — L97522 Non-pressure chronic ulcer of other part of left foot with fat layer exposed: Secondary | ICD-10-CM | POA: Insufficient documentation

## 2024-03-30 DIAGNOSIS — E11621 Type 2 diabetes mellitus with foot ulcer: Secondary | ICD-10-CM | POA: Insufficient documentation

## 2024-04-13 ENCOUNTER — Encounter (HOSPITAL_BASED_OUTPATIENT_CLINIC_OR_DEPARTMENT_OTHER): Admitting: General Surgery

## 2024-04-20 ENCOUNTER — Encounter (HOSPITAL_BASED_OUTPATIENT_CLINIC_OR_DEPARTMENT_OTHER): Admitting: General Surgery

## 2024-04-22 ENCOUNTER — Encounter (HOSPITAL_BASED_OUTPATIENT_CLINIC_OR_DEPARTMENT_OTHER): Admitting: General Surgery

## 2024-05-06 ENCOUNTER — Encounter (HOSPITAL_BASED_OUTPATIENT_CLINIC_OR_DEPARTMENT_OTHER): Admitting: General Surgery
